# Patient Record
Sex: Male | Born: 1970 | Race: White | Hispanic: No | Marital: Single | Smoking: Former smoker
Health system: Southern US, Community
[De-identification: ages and names within clinical notes are randomized; demographics above are authoritative.]

## PROBLEM LIST (undated history)

## (undated) ENCOUNTER — Inpatient Hospital Stay (HOSPITAL_COMMUNITY): Payer: Self-pay | Admitting: Podiatry

## (undated) DIAGNOSIS — F32A Depression, unspecified: Secondary | ICD-10-CM

## (undated) DIAGNOSIS — Z95810 Presence of automatic (implantable) cardiac defibrillator: Secondary | ICD-10-CM

## (undated) DIAGNOSIS — N189 Chronic kidney disease, unspecified: Secondary | ICD-10-CM

## (undated) DIAGNOSIS — L97509 Non-pressure chronic ulcer of other part of unspecified foot with unspecified severity: Secondary | ICD-10-CM

## (undated) DIAGNOSIS — I509 Heart failure, unspecified: Secondary | ICD-10-CM

## (undated) DIAGNOSIS — I499 Cardiac arrhythmia, unspecified: Secondary | ICD-10-CM

## (undated) DIAGNOSIS — I493 Ventricular premature depolarization: Secondary | ICD-10-CM

## (undated) DIAGNOSIS — E11621 Type 2 diabetes mellitus with foot ulcer: Secondary | ICD-10-CM

## (undated) DIAGNOSIS — E119 Type 2 diabetes mellitus without complications: Secondary | ICD-10-CM

## (undated) DIAGNOSIS — N1831 Chronic kidney disease, stage 3a: Secondary | ICD-10-CM

## (undated) DIAGNOSIS — I219 Acute myocardial infarction, unspecified: Secondary | ICD-10-CM

## (undated) DIAGNOSIS — I2510 Atherosclerotic heart disease of native coronary artery without angina pectoris: Secondary | ICD-10-CM

## (undated) DIAGNOSIS — D649 Anemia, unspecified: Secondary | ICD-10-CM

## (undated) DIAGNOSIS — I429 Cardiomyopathy, unspecified: Secondary | ICD-10-CM

## (undated) DIAGNOSIS — E785 Hyperlipidemia, unspecified: Secondary | ICD-10-CM

## (undated) DIAGNOSIS — I10 Essential (primary) hypertension: Secondary | ICD-10-CM

## (undated) DIAGNOSIS — M726 Necrotizing fasciitis: Secondary | ICD-10-CM

## (undated) DIAGNOSIS — I214 Non-ST elevation (NSTEMI) myocardial infarction: Secondary | ICD-10-CM

## (undated) DIAGNOSIS — R0600 Dyspnea, unspecified: Secondary | ICD-10-CM

## (undated) DIAGNOSIS — I5022 Chronic systolic (congestive) heart failure: Secondary | ICD-10-CM

## (undated) DIAGNOSIS — I472 Ventricular tachycardia, unspecified: Secondary | ICD-10-CM

## (undated) HISTORY — DX: Type 2 diabetes mellitus with foot ulcer: L97.509

## (undated) HISTORY — PX: APPENDECTOMY: SHX54

## (undated) HISTORY — PX: CARDIAC CATHETERIZATION: SHX172

## (undated) HISTORY — DX: Atherosclerotic heart disease of native coronary artery without angina pectoris: I25.10

## (undated) HISTORY — DX: Type 2 diabetes mellitus with foot ulcer: E11.621

## (undated) HISTORY — DX: Heart failure, unspecified: I50.9

## (undated) HISTORY — DX: Cardiomyopathy, unspecified: I42.9

## (undated) HISTORY — DX: Hyperlipidemia, unspecified: E78.5

## (undated) HISTORY — PX: COLON SURGERY: SHX602

## (undated) HISTORY — DX: Type 2 diabetes mellitus without complications: E11.9

---

## 2018-07-27 HISTORY — PX: TOE AMPUTATION: SHX809

## 2019-06-05 DIAGNOSIS — L089 Local infection of the skin and subcutaneous tissue, unspecified: Secondary | ICD-10-CM

## 2019-06-05 DIAGNOSIS — L03119 Cellulitis of unspecified part of limb: Secondary | ICD-10-CM

## 2019-06-05 DIAGNOSIS — E11628 Type 2 diabetes mellitus with other skin complications: Secondary | ICD-10-CM

## 2019-06-05 DIAGNOSIS — L02619 Cutaneous abscess of unspecified foot: Secondary | ICD-10-CM

## 2019-06-12 ENCOUNTER — Ambulatory Visit (INDEPENDENT_AMBULATORY_CARE_PROVIDER_SITE_OTHER): Payer: Self-pay | Admitting: Podiatry

## 2019-06-12 ENCOUNTER — Other Ambulatory Visit: Payer: Self-pay

## 2019-06-12 DIAGNOSIS — Z9889 Other specified postprocedural states: Secondary | ICD-10-CM

## 2019-06-12 DIAGNOSIS — Z481 Encounter for planned postprocedural wound closure: Secondary | ICD-10-CM

## 2019-06-12 NOTE — Progress Notes (Signed)
  Subjective:  Patient ID: Adam James, male    DOB: 01/11/1971,  MRN: [id number]  Chief Complaint  Patient presents with  . Routine Post Op    POV#1 Pt. states," no pian. just neurpathy pain but that's it." Tx: abx -pt denies N/V/F/Ch   . Diabetes    FBS: 170 A1C: 11     DOS: 06/05/2019 Procedure: Partial 5th Ray resection right foot  48 y.o. male returns for post-op check. Doing well denies post-op issues or concerns. Taking doxycycline.  Review of Systems: Negative except as noted in the HPI. Denies N/V/F/Ch.  No past medical history on file.  Current Outpatient Medications:  .  atorvastatin (LIPITOR) 40 MG tablet, Take by mouth., Disp: , Rfl:  .  diltiazem (CARDIZEM CD) 120 MG 24 hr capsule, Take by mouth., Disp: , Rfl:  .  insulin glargine (LANTUS) 100 UNIT/ML injection, Inject into the skin., Disp: , Rfl:  .  insulin lispro (HUMALOG) 100 UNIT/ML injection, Inject into the skin., Disp: , Rfl:  .  Insulin Syringe-Needle U-100 (BD VEO INSULIN SYR U/F 1/2UNIT) 31G X 15/64" 0.3 ML MISC, Inject into the skin., Disp: , Rfl:  .  pantoprazole (PROTONIX) 40 MG tablet, Take by mouth., Disp: , Rfl:  .  aspirin EC 81 MG tablet, Take by mouth., Disp: , Rfl:   Social History   Tobacco Use  Smoking Status Not on file    Allergies  Allergen Reactions  . Morphine Itching   Objective:  There were no vitals filed for this visit. There is no height or weight on file to calculate BMI. Constitutional Well developed. Well nourished.  Vascular Foot warm and well perfused. Capillary refill normal to all digits.   Neurologic Normal speech. Oriented to person, place, and time. Epicritic sensation to light touch grossly present bilaterally.  Dermatologic Skin healing, edges coapted with retention suture. Some necrotic areas of the wound. No warmth erythema signs of acute infection.  Orthopedic: No tenderness to palpation noted about the surgical site.   Radiographs: None Assessment:    1. Post-operative state   2. Encounter for planned post-operative wound closure    Plan:  Patient was evaluated and treated and all questions answered.  S/p foot surgery right -Progressing as expected post-operatively. -XR: none -WB Status: WBAT in surgical shoe -Sutures: intact -Medications: Continue doxycycline -Foot redressed. Betadine WTD -Plan for RTOR for wound debridement and delayed closure. Consent forms reviewed and signed.  No follow-ups on file.

## 2019-06-12 NOTE — Patient Instructions (Signed)
Pre-Operative Instructions  Congratulations, you have decided to take an important step towards improving your quality of life.  You can be assured that the doctors and staff at [facility] will be with you every step of the way.  Here are some important things you should know:  1. Plan to be at the surgery center/hospital at least 1 (one) hour prior to your scheduled time, unless otherwise directed by the surgical center/hospital staff.  You must have a responsible adult accompany you, remain during the surgery and drive you home.  Make sure you have directions to the surgical center/hospital to ensure you arrive on time. 2. If you are having surgery at [facility] or [facility] hospitals, you will need a copy of your medical history and physical form from your family physician within one month prior to the date of surgery. We will give you a form for your primary physician to complete.  3. We make every effort to accommodate the date you request for surgery.  However, there are times where surgery dates or times have to be moved.  We will contact you as soon as possible if a change in schedule is required.   4. No aspirin/ibuprofen for one week before surgery.  If you are on aspirin, any non-steroidal anti-inflammatory medications (Mobic, Aleve, Ibuprofen) should not be taken seven (7) days prior to your surgery.  You make take Tylenol for pain prior to surgery.  5. Medications - If you are taking daily heart and blood pressure medications, seizure, reflux, allergy, asthma, anxiety, pain or diabetes medications, make sure you notify the surgery center/hospital before the day of surgery so they can tell you which medications you should take or avoid the day of surgery. 6. No food or drink after midnight the night before surgery unless directed otherwise by surgical center/hospital staff. 7. No alcoholic beverages 24-hours prior to surgery.  No smoking 24-hours prior or 24-hours after  surgery. 8. Wear loose pants or shorts. They should be loose enough to fit over bandages, boots, and casts. 9. Don't wear slip-on shoes. Sneakers are preferred. 10. Bring your boot with you to the surgery center/hospital.  Also bring crutches or a walker if your physician has prescribed it for you.  If you do not have this equipment, it will be provided for you after surgery. 11. If you have not been contacted by the surgery center/hospital by the day before your surgery, call to confirm the date and time of your surgery. 12. Leave-time from work may vary depending on the type of surgery you have.  Appropriate arrangements should be made prior to surgery with your employer. 13. Prescriptions will be provided immediately following surgery by your doctor.  Fill these as soon as possible after surgery and take the medication as directed. Pain medications will not be refilled on weekends and must be approved by the doctor. 14. Remove nail polish on the operative foot and avoid getting pedicures prior to surgery. 15. Wash the night before surgery.  The night before surgery wash the foot and leg well with water and the antibacterial soap provided. Be sure to pay special attention to beneath the toenails and in between the toes.  Wash for at least three (3) minutes. Rinse thoroughly with water and dry well with a towel.  Perform this wash unless told not to do so by your physician.  Enclosed: 1 Ice pack (please put in freezer the night before surgery)   1 Hibiclens skin cleaner     Pre-op instructions  If you have any questions regarding the instructions, please do not hesitate to call our office.  [place]: [address], [place], [place] [address] -- [phone]  [place]: [address] , [place] [address] -- [phone]  [place]: [address]  [place], [place] [address] -- [phone]   Website: [email or web address] 

## 2019-06-13 ENCOUNTER — Encounter: Payer: Self-pay | Admitting: Podiatry

## 2019-06-13 DIAGNOSIS — Z481 Encounter for planned postprocedural wound closure: Secondary | ICD-10-CM

## 2019-06-15 NOTE — Progress Notes (Signed)
Patient was hospitalized at [facility], was seen as a consult, and underwent surgery.

## 2019-06-21 ENCOUNTER — Other Ambulatory Visit: Payer: Self-pay

## 2019-06-21 ENCOUNTER — Ambulatory Visit (INDEPENDENT_AMBULATORY_CARE_PROVIDER_SITE_OTHER): Payer: Self-pay | Admitting: Sports Medicine

## 2019-06-21 ENCOUNTER — Ambulatory Visit (INDEPENDENT_AMBULATORY_CARE_PROVIDER_SITE_OTHER): Payer: Self-pay

## 2019-06-21 ENCOUNTER — Other Ambulatory Visit: Payer: Self-pay | Admitting: Sports Medicine

## 2019-06-21 ENCOUNTER — Encounter: Payer: Self-pay | Admitting: Sports Medicine

## 2019-06-21 DIAGNOSIS — Z89422 Acquired absence of other left toe(s): Secondary | ICD-10-CM

## 2019-06-21 DIAGNOSIS — Z9889 Other specified postprocedural states: Secondary | ICD-10-CM

## 2019-06-21 DIAGNOSIS — L089 Local infection of the skin and subcutaneous tissue, unspecified: Secondary | ICD-10-CM

## 2019-06-21 DIAGNOSIS — E11628 Type 2 diabetes mellitus with other skin complications: Secondary | ICD-10-CM

## 2019-06-21 NOTE — Progress Notes (Signed)
Subjective: Adam James is a 48 y.o. diabetic male patient seen today in office for POV #1 (DOS 06/13/2019), S/P wound debridement with closure performed by Dr. March Rummage. Patient denies pain at surgical site, denies calf pain, denies headache, chest pain, shortness of breath, nausea, vomiting, fever, or chills. No other issues noted.   Blood sugar this morning was 200  There are no active problems to display for this patient.   Current Outpatient Medications on File Prior to Visit  Medication Sig Dispense Refill  . aspirin EC 81 MG tablet Take by mouth.    Marland Kitchen atorvastatin (LIPITOR) 40 MG tablet Take by mouth.    . diltiazem (CARDIZEM CD) 120 MG 24 hr capsule Take by mouth.    . insulin glargine (LANTUS) 100 UNIT/ML injection Inject into the skin.    Marland Kitchen insulin lispro (HUMALOG) 100 UNIT/ML injection Inject into the skin.    . Insulin Syringe-Needle U-100 (BD VEO INSULIN SYR U/F 1/2UNIT) 31G X 15/64" 0.3 ML MISC Inject into the skin.    . pantoprazole (PROTONIX) 40 MG tablet Take by mouth.     No current facility-administered medications on file prior to visit.     Allergies  Allergen Reactions  . Morphine Itching    Objective: There were no vitals filed for this visit.  General: No acute distress, AAOx3  Right foot: Staples and sutures intact with no gapping or dehiscence at surgical site, there is a small central ulceration noted at the surgical site with fibrogranular tissue no surrounding cellulitis mild edema very minimal active drainage.  Capillary fill time intact 1 through 4 on right.  No pain or crepitation with range of motion right foot.  Protective sensation absent on right.  No pain with calf compression.   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 March Rummage at next office visit. In the meantime, patient to call office if any issues or problems arise.   Landis Martins, DPM

## 2019-06-26 ENCOUNTER — Other Ambulatory Visit: Payer: Self-pay | Admitting: Sports Medicine

## 2019-06-26 ENCOUNTER — Other Ambulatory Visit: Payer: Self-pay

## 2019-06-26 DIAGNOSIS — Z9889 Other specified postprocedural states: Secondary | ICD-10-CM

## 2019-06-26 DIAGNOSIS — Z89422 Acquired absence of other left toe(s): Secondary | ICD-10-CM

## 2019-06-27 ENCOUNTER — Ambulatory Visit (INDEPENDENT_AMBULATORY_CARE_PROVIDER_SITE_OTHER): Payer: Self-pay | Admitting: Podiatry

## 2019-06-27 ENCOUNTER — Other Ambulatory Visit: Payer: Self-pay

## 2019-06-27 DIAGNOSIS — Z9889 Other specified postprocedural states: Secondary | ICD-10-CM

## 2019-06-27 NOTE — Progress Notes (Signed)
  Subjective:  Patient ID: Adam James, male    DOB: 02-07-1971,  MRN: [id number]  Chief Complaint  Patient presents with  . Wound Check    POV #2 Pt. states," doing fine no pain/itching.' -pt dneies any issues/N/V/?Heil Tx: sx shoe and elevation     48 y.o. male returns for post-op check. Doing well denies pain or concerns.  Review of Systems: Negative except as noted in the HPI. Denies N/V/F/Ch.  No past medical history on file.  Current Outpatient Medications:  .  aspirin EC 81 MG tablet, Take by mouth., Disp: , Rfl:  .  atorvastatin (LIPITOR) 40 MG tablet, Take by mouth., Disp: , Rfl:  .  diltiazem (CARDIZEM CD) 120 MG 24 hr capsule, Take by mouth., Disp: , Rfl:  .  insulin glargine (LANTUS) 100 UNIT/ML injection, Inject into the skin., Disp: , Rfl:  .  insulin lispro (HUMALOG) 100 UNIT/ML injection, Inject into the skin., Disp: , Rfl:  .  Insulin Syringe-Needle U-100 (BD VEO INSULIN SYR U/F 1/2UNIT) 31G X 15/64" 0.3 ML MISC, Inject into the skin., Disp: , Rfl:  .  pantoprazole (PROTONIX) 40 MG tablet, Take by mouth., Disp: , Rfl:  .  traMADol (ULTRAM) 50 MG tablet, Take 50 mg by mouth every 6 (six) hours as needed., Disp: , Rfl:   Social History   Tobacco Use  Smoking Status Not on file    Allergies  Allergen Reactions  . Morphine Itching   Objective:  There were no vitals filed for this visit. There is no height or weight on file to calculate BMI. Constitutional Well developed. Well nourished.  Vascular Foot warm and well perfused. Capillary refill normal to all digits.   Neurologic Normal speech. Oriented to person, place, and time. Epicritic sensation to light touch grossly present bilaterally.  Dermatologic Skin healing well without signs of infection. Skin edges well coapted without signs of infection except for triangular area centrally - but area is healing well without warmth erythema.  Orthopedic: No tenderness to palpation noted about the surgical site.    Radiographs: none Assessment:  No diagnosis found. Plan:  Patient was evaluated and treated and all questions answered.  S/p foot surgery right -Progressing as expected post-operatively. -XR: none -WB Status: WBAT in surgical shoe -Sutures: intact. -Medications: nonerefilled -Foot redressed. Dressed with silvadene and DSD  No follow-ups on file.

## 2019-07-04 ENCOUNTER — Ambulatory Visit (INDEPENDENT_AMBULATORY_CARE_PROVIDER_SITE_OTHER): Payer: Self-pay | Admitting: Podiatry

## 2019-07-04 ENCOUNTER — Other Ambulatory Visit: Payer: Self-pay

## 2019-07-04 DIAGNOSIS — Z9889 Other specified postprocedural states: Secondary | ICD-10-CM

## 2019-07-04 MED ORDER — SILVER SULFADIAZINE 1 % EX CREA: TOPICAL_CREAM | CUTANEOUS | 0 refills | Status: DC

## 2019-07-04 NOTE — Progress Notes (Signed)
  Subjective:  Patient ID: Adam James, male    DOB: March 19, 1971,  MRN: [id number]  Chief Complaint  Patient presents with  . Routine Post Op    POV - Pt states,": everything seem sokay." -pt states only with neuropathy pain but otherwise no other pain symptoms -pt denies N/V/F?CH -w/ dizziness   . Diabetes    FBS: 19    48 y.o. male returns for post-op check. Doing well denies pain or concerns. Not wearing surgical shoe.  Review of Systems: Negative except as noted in the HPI. Denies N/V/F/Ch.  No past medical history on file.  Current Outpatient Medications:  .  aspirin EC 81 MG tablet, Take by mouth., Disp: , Rfl:  .  atorvastatin (LIPITOR) 40 MG tablet, Take by mouth., Disp: , Rfl:  .  diltiazem (CARDIZEM CD) 120 MG 24 hr capsule, Take by mouth., Disp: , Rfl:  .  insulin glargine (LANTUS) 100 UNIT/ML injection, Inject into the skin., Disp: , Rfl:  .  insulin lispro (HUMALOG) 100 UNIT/ML injection, Inject into the skin., Disp: , Rfl:  .  Insulin Syringe-Needle U-100 (BD VEO INSULIN SYR U/F 1/2UNIT) 31G X 15/64" 0.3 ML MISC, Inject into the skin., Disp: , Rfl:  .  pantoprazole (PROTONIX) 40 MG tablet, Take by mouth., Disp: , Rfl:  .  silver sulfADIAZINE (SILVADENE) 1 % cream, Apply pea-sized amount to wound daily., Disp: 50 g, Rfl: 0 .  traMADol (ULTRAM) 50 MG tablet, Take 50 mg by mouth every 6 (six) hours as needed., Disp: , Rfl:   Social History   Tobacco Use  Smoking Status Not on file    Allergies  Allergen Reactions  . Morphine Itching   Objective:  There were no vitals filed for this visit. There is no height or weight on file to calculate BMI. Constitutional Well developed. Well nourished.  Vascular Foot warm and well perfused. Capillary refill normal to all digits.   Neurologic Normal speech. Oriented to person, place, and time. Epicritic sensation to light touch grossly present bilaterally.  Dermatologic Skin healing well without signs of infection.  Central area with overlying hyperkeratosis with fibrogranular bed before debridement. No bone exposure.  Orthopedic: No tenderness to palpation noted about the surgical site.   Radiographs: none Assessment:   1. Post-operative state    Plan:  Patient was evaluated and treated and all questions answered.  S/p foot surgery right -Progressing as expected post-operatively. -XR: none -WB Status: WBAT in surgical shoe. -Sutures: removed. Steris left intact. -Medications: rx silvadene. Educated on daily use. -Foot redressed. Dressed with silvadene and DSD -F/u in 1 week for staple removal.   No follow-ups on file.

## 2019-07-10 ENCOUNTER — Ambulatory Visit (INDEPENDENT_AMBULATORY_CARE_PROVIDER_SITE_OTHER): Payer: Self-pay | Admitting: Podiatry

## 2019-07-10 ENCOUNTER — Other Ambulatory Visit: Payer: Self-pay

## 2019-07-10 DIAGNOSIS — Z89422 Acquired absence of other left toe(s): Secondary | ICD-10-CM

## 2019-07-10 DIAGNOSIS — Z9889 Other specified postprocedural states: Secondary | ICD-10-CM

## 2019-07-25 ENCOUNTER — Other Ambulatory Visit: Payer: Self-pay

## 2019-07-25 ENCOUNTER — Ambulatory Visit (INDEPENDENT_AMBULATORY_CARE_PROVIDER_SITE_OTHER): Payer: Self-pay | Admitting: Podiatry

## 2019-07-25 ENCOUNTER — Encounter: Payer: Self-pay | Admitting: Podiatry

## 2019-07-25 DIAGNOSIS — Z9889 Other specified postprocedural states: Secondary | ICD-10-CM

## 2019-07-25 DIAGNOSIS — Z89422 Acquired absence of other left toe(s): Secondary | ICD-10-CM

## 2019-07-25 NOTE — Progress Notes (Signed)
  Subjective:  Patient ID: Adam James, male    DOB: [birth year],  MRN: [id number]  Chief Complaint  Patient presents with  . Routine Post Op    Pt. states," no pain. EVerything seems okay , but nasty." tx: dressing change and silvadene -pt denies N/V?FhC     48 y.o. male returns for post-op check. Doing well denies pain or concerns. Not wearing surgical shoe.  Review of Systems: Negative except as noted in the HPI. Denies N/V/F/Ch.  No past medical history on file.  Current Outpatient Medications:  .  aspirin EC 81 MG tablet, Take by mouth., Disp: , Rfl:  .  atorvastatin (LIPITOR) 40 MG tablet, Take by mouth., Disp: , Rfl:  .  diltiazem (CARDIZEM CD) 120 MG 24 hr capsule, Take by mouth., Disp: , Rfl:  .  insulin glargine (LANTUS) 100 UNIT/ML injection, Inject into the skin., Disp: , Rfl:  .  insulin lispro (HUMALOG) 100 UNIT/ML injection, Inject into the skin., Disp: , Rfl:  .  Insulin Syringe-Needle U-100 (BD VEO INSULIN SYR U/F 1/2UNIT) 31G X 15/64" 0.3 ML MISC, Inject into the skin., Disp: , Rfl:  .  pantoprazole (PROTONIX) 40 MG tablet, Take by mouth., Disp: , Rfl:  .  silver sulfADIAZINE (SILVADENE) 1 % cream, Apply pea-sized amount to wound daily., Disp: 50 g, Rfl: 0 .  traMADol (ULTRAM) 50 MG tablet, Take 50 mg by mouth every 6 (six) hours as needed., Disp: , Rfl:   Social History   Tobacco Use  Smoking Status Not on file    Allergies  Allergen Reactions  . Morphine Itching   Objective:  There were no vitals filed for this visit. There is no height or weight on file to calculate BMI. Constitutional Well developed. Well nourished.  Vascular Foot warm and well perfused. Capillary refill normal to all digits.   Neurologic Normal speech. Oriented to person, place, and time. Epicritic sensation to light touch grossly present bilaterally.  Dermatologic Skin healing with only central area of delayed healing but without signs of infection.  Orthopedic: No tenderness  to palpation noted about the surgical site.   Radiographs: none Assessment:   1. Post-operative state   2. S/P amputation of lesser toe, left ([place])    Plan:  Patient was evaluated and treated and all questions answered.  S/p foot surgery right -Progressing as expected post-operatively. -XR: none -WB Status: WBAT in surgical shoe. -Sutures: all out   -Medications: continue silvadene -Foot redressed. Dressed with silvadene and DSD -F/u in 1 week for staple removal.   No follow-ups on file.

## 2019-07-25 NOTE — Progress Notes (Signed)
  Subjective:  Patient ID: Adam James, male    DOB: 09/07/1970,  MRN: [id number]  Chief Complaint  Patient presents with  . Routine Post Op    PT. states," looks goot to me, big/hard skin cam eoff with staples last time." -pt denies pain/redness/swellign -w/ very litlte draingage Tx: silvadene and dressing change -pt denies N/V/F?Ch   . Diabetes    FBS: 51    48 y.o. male presents for wound care. Hx confirmed with patient.  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 Skin xerotic.  Radiographs:  none Assessment:  1. Post-operative state   2. S/P amputation of lesser toe, left ([place])     Plan:  Patient was evaluated and treated and all questions answered.  Ulcer none -Debrided as below -Dressed with silvadene, DSD -Offloading: normal shoe gear ok at this time -Patient advised to return promptly for wound worsening or should signs of infection develop.  Return in about 4 weeks (around 08/22/2019) for Wound Care, Right.

## 2019-08-22 ENCOUNTER — Ambulatory Visit (INDEPENDENT_AMBULATORY_CARE_PROVIDER_SITE_OTHER): Payer: Self-pay | Admitting: Podiatry

## 2019-08-22 DIAGNOSIS — Z5329 Procedure and treatment not carried out because of patient's decision for other reasons: Secondary | ICD-10-CM

## 2019-08-22 NOTE — Progress Notes (Signed)
No show for appt. 

## 2020-08-31 DIAGNOSIS — I340 Nonrheumatic mitral (valve) insufficiency: Secondary | ICD-10-CM

## 2020-09-01 DIAGNOSIS — I429 Cardiomyopathy, unspecified: Secondary | ICD-10-CM

## 2020-09-01 DIAGNOSIS — R778 Other specified abnormalities of plasma proteins: Secondary | ICD-10-CM

## 2020-09-01 DIAGNOSIS — I119 Hypertensive heart disease without heart failure: Secondary | ICD-10-CM

## 2020-09-01 DIAGNOSIS — E1169 Type 2 diabetes mellitus with other specified complication: Secondary | ICD-10-CM

## 2020-09-01 DIAGNOSIS — I2510 Atherosclerotic heart disease of native coronary artery without angina pectoris: Secondary | ICD-10-CM

## 2020-09-01 DIAGNOSIS — Z794 Long term (current) use of insulin: Secondary | ICD-10-CM

## 2020-09-01 DIAGNOSIS — I5022 Chronic systolic (congestive) heart failure: Secondary | ICD-10-CM

## 2020-09-08 NOTE — Progress Notes (Unsigned)
Cardiology Office Note:    Date:  09/09/2020   ID:  Adam James, DOB 05-Jun-1971, MRN [id number]  PCP:  Patient, No Pcp Per  Cardiologist:  Norman Herrlich, MD    Referring MD: No ref. provider found    ASSESSMENT:    1. Coronary artery disease of native artery of native heart with stable angina pectoris (HCC)   2. Ischemic cardiomyopathy   3. Dyslipidemia   4. Type 2 diabetes mellitus with complication, without long-term current use of insulin (HCC)    PLAN:    In order of problems listed above:  1. Known CAD is developed a severe cardiomyopathy and during recent admission with heart failure and foot ulcer and elevated troponin static pattern.  He will undergo coronary angiography for risk stratification and may require further revascularization.  Has been compliant with follow-up. 2. Severe continue current therapy when I see him in follow-up on transition to [facility].  I am going to increase his diuretic 3. Continue his statin 4. Continue current treatment   Next appointment: 1 month   Medication Adjustments/Labs and Tests Ordered: Current medicines are reviewed at length with the patient today.  Concerns regarding medicines are outlined above.  Orders Placed This Encounter  Procedures  . Basic metabolic panel  . CBC  . EKG 12-Lead   Meds ordered this encounter  Medications  . aspirin EC 81 MG tablet    Sig: Take 1 tablet (81 mg total) by mouth daily. Swallow whole.    Dispense:  90 tablet    Refill:  3  . furosemide (LASIX) 40 MG tablet    Sig: Take one tablet by mouth daily alternating with one tablet by mouth twice daily every other day.    Dispense:  90 tablet    Refill:  3    Chief Complaint  Patient presents with  . Follow-up    With recent [facility] admission with severe cardiomyopathy history of CAD with remote PCI edema poorly controlled diabetes with a foot ulcer    History of Present Illness:    Adam James is a 50 y.o. male with a  hx of CAD severe LV dysfunction and heart failure last seen 09/02/2020 while at [facility] in consultation.  He was admitted to the hospital with peripheral edema and diabetic foot ulcer with uncontrolled type 2 diabetes.  His evaluation showed an elevated proBNP level of 836 troponin was elevated and a non-ACS pattern felt to be due to heart failure creatinine 1.10 GFR greater than 60 cc hemoglobin 11.5 platelets 197,000.  Usual blood sugar was greater than 300.  Echocardiogram showed normal left ventricular size ejection fraction in the range of 30% and mild mitral and aortic regurgitation.  His EKG showed sinus tachycardia 107 bpm otherwise normal..  The background history remote PCI 2005 and stent [place] with known CAD and was advised to have further outpatient evaluation with his cardiomyopathy heart failure and elevated troponin.  I requested records from [place] state that had not arrived when I saw him at [facility].  Compliance with diet, lifestyle and medications: Yes  I received his records from [place] he underwent PCI and stent to the LAD 06/08/2006.  That time he had mild nonobstructive CAD in a ramus branch right coronary artery left circumflex.  EF was 52%.  Since discharge home he is improved but still short of breath walking outdoors and has a little bit of peripheral edema.  He is unable to  control the salt in his diet not weighing daily.  No chest pain palpitation or syncope.  He is taking high-dose aspirin I asked him to reduce to 81 mg daily.  Ready go ahead and schedule outpatient heart catheterization with his marked decrease in ejection fraction.  I do not think a myocardial perfusion study give [place] the answers were looking for and with previous stent cardiac CTA is problematic.  He has no dye allergy in the hospital abnormal renal function.  He is compliant with follow-up.  The wound on his right foot is stable and he is treating it with topical  preparation  Past Medical History:  Diagnosis Date  . CAD (coronary artery disease)   . Cardiomyopathy (HCC)   . Diabetic foot ulcer (HCC)   . Dyslipidemia   . Heart failure (HCC)   . Type 2 diabetes mellitus (HCC)     Past Surgical History:  Procedure Laterality Date  . CARDIAC CATHETERIZATION    . TOE AMPUTATION  2020    Current Medications: Current Meds  Medication Sig  . aspirin EC 81 MG tablet Take 1 tablet (81 mg total) by mouth daily. Swallow whole.  Marland Kitchen atorvastatin (LIPITOR) 40 MG tablet Take by mouth.  . furosemide (LASIX) 40 MG tablet Take one tablet by mouth daily alternating with one tablet by mouth twice daily every other day.  . glyBURIDE-metformin (GLUCOVANCE) 5-500 MG tablet Take 1 tablet by mouth 2 (two) times daily with a meal.  . insulin glargine (LANTUS) 100 UNIT/ML injection Inject into the skin.  Marland Kitchen irbesartan (AVAPRO) 75 MG tablet Take 75 mg by mouth daily.  . metoprolol succinate (TOPROL-XL) 25 MG 24 hr tablet Take 25 mg by mouth daily.  . [DISCONTINUED] aspirin EC 325 MG tablet Take 325 mg by mouth.  . [DISCONTINUED] furosemide (LASIX) 40 MG tablet Take 40 mg by mouth daily.     Allergies:   Morphine   Social History   Socioeconomic History  . Marital status: Single    Spouse name: Not on file  . Number of children: Not on file  . Years of education: Not on file  . Highest education level: Not on file  Occupational History  . Not on file  Tobacco Use  . Smoking status: Current Every Day Smoker    Types: Cigarettes  . Smokeless tobacco: Never Used  . Tobacco comment: 5-6 daily  Vaping Use  . Vaping Use: Never used  Substance and Sexual Activity  . Alcohol use: Not Currently    Comment: Very rare  . Drug use: Not Currently    Types: Marijuana  . Sexual activity: Not on file  Other Topics Concern  . Not on file  Social History Narrative  . Not on file   Social Determinants of Health   Financial Resource Strain: Not on file  Food  Insecurity: Not on file  Transportation Needs: Not on file  Physical Activity: Not on file  Stress: Not on file  Social Connections: Not on file     Family History: The patient's family history includes Congestive Heart Failure in his brother; Heart failure in his father; Hypertension in his brother and mother. ROS:   Please see the history of present illness.    All other systems reviewed and are negative.  EKGs/Labs/Other Studies Reviewed:    The following studies were reviewed today:  EKG:  EKG ordered today and personally reviewed.  The ekg ordered today demonstrates sinus rhythm lateral T wave inversion  Recent Labs: No results found for requested labs within last 8760 hours.  Recent Lipid Panel No results found for: CHOL, TRIG, HDL, CHOLHDL, VLDL, LDLCALC, LDLDIRECT  Physical Exam:    VS:  BP 94/64   Pulse 93   Ht 6\' 3"  (1.905 m)   Wt 232 lb (105.2 kg)   SpO2 94%   BMI 29.00 kg/m     Wt Readings from Last 3 Encounters:  09/09/20 232 lb (105.2 kg)     GEN:  Well nourished, well developed in no acute distress HEENT: Normal NECK: No JVD; No carotid bruits LYMPHATICS: No lymphadenopathy CARDIAC: RRR, no murmurs, rubs, gallops RESPIRATORY:  Clear to auscultation without rales, wheezing or rhonchi  ABDOMEN: Soft, non-tender, non-distended MUSCULOSKELETAL: He has 1-2+ predominantly at the ankle edema.  Edema; No deformity  SKIN: Warm and dry NEUROLOGIC:  Alert and oriented x 3 PSYCHIATRIC:  Normal affect    Signed, 09/11/20, MD  09/09/2020 1:29 PM    [facility]

## 2020-09-09 ENCOUNTER — Other Ambulatory Visit: Payer: Self-pay

## 2020-09-09 ENCOUNTER — Ambulatory Visit (INDEPENDENT_AMBULATORY_CARE_PROVIDER_SITE_OTHER): Payer: Self-pay | Admitting: Cardiology

## 2020-09-09 ENCOUNTER — Encounter: Payer: Self-pay | Admitting: Cardiology

## 2020-09-09 VITALS — BP 94/64 | HR 93 | Ht 75.0 in | Wt 232.0 lb

## 2020-09-09 DIAGNOSIS — I255 Ischemic cardiomyopathy: Secondary | ICD-10-CM

## 2020-09-09 DIAGNOSIS — I25118 Atherosclerotic heart disease of native coronary artery with other forms of angina pectoris: Secondary | ICD-10-CM

## 2020-09-09 DIAGNOSIS — E118 Type 2 diabetes mellitus with unspecified complications: Secondary | ICD-10-CM

## 2020-09-09 DIAGNOSIS — E785 Hyperlipidemia, unspecified: Secondary | ICD-10-CM

## 2020-09-09 MED ORDER — ASPIRIN EC 81 MG PO TBEC: 81.0000 mg | DELAYED_RELEASE_TABLET | Freq: Every day | ORAL | 3 refills | Status: DC

## 2020-09-09 MED ORDER — FUROSEMIDE 40 MG PO TABS: ORAL_TABLET | ORAL | 3 refills | Status: DC

## 2020-09-09 NOTE — Patient Instructions (Signed)
Medication Instructions:  Your physician has recommended you make the following change in your medication:  DECREASE: Aspirin 81 mg take one tablet by mouth daily.  INCREASE: Furosemide 40 mg take an extra tablet by mouth every other day. *If you need a refill on your cardiac medications before your next appointment, please call your pharmacy*   Lab Work: Your physician recommends that you return for lab work in: TODAY CBC, BMP If you have labs (blood work) drawn today and your tests are completely normal, you will receive your results only by: Marland Kitchen MyChart Message (if you have MyChart) OR . A paper copy in the mail If you have any lab test that is abnormal or we need to change your treatment, we will call you to review the results.   Testing/Procedures:    [facility] [place] [address] [place] [address] Dept: [phone] Loc: [phone]  Adam James  09/09/2020  You are scheduled for a Cardiac Catheterization on Friday, February 18 with Dr. Tresa Endo  1. Please arrive at the [facility] (Main Entrance A) at [facility]: [address] [place], [place] [address] at 5:30 AM (This time is two hours before your procedure to ensure your preparation). Free valet parking service is available.   Special note: Every effort is made to have your procedure done on time. Please understand that emergencies sometimes delay scheduled procedures.  2. Diet: Do not eat solid foods after midnight.  The patient may have clear liquids until 5am upon the day of the procedure.  3. Labs: YOU HAD YOUR LABS DRAWN TODAY 09/09/2020  4. Medication instructions in preparation for your procedure:   Contrast Allergy: No  Stop taking, Lasix (Furosemide)  Friday, February 18,2022  Take only HALF OF YOUR REQUIRED units of insulin the night before your procedure. Do not take any insulin on the day of the procedure.   Do  not take Diabetes Med Glucovance (Metformin + Glyburide) on the day of the procedure and HOLD 48 HOURS AFTER THE PROCEDURE.  On the morning of your procedure, take your Aspirin and any morning medicines NOT listed above.  You may use sips of water.  5. Plan for one night stay--bring personal belongings. 6. Bring a current list of your medications and current insurance cards. 7. You MUST have a responsible person to drive you home. 8. Someone MUST be with you the first 24 hours after you arrive home or your discharge will be delayed. 9. Please wear clothes that are easy to get on and off and wear slip-on shoes.  Thank you for allowing [place] to care for you!   -- [facility]    Follow-Up: At [facility], you and your health needs are our priority.  As part of our continuing mission to provide you with exceptional heart care, we have created designated Provider Care Teams.  These Care Teams include your primary Cardiologist (physician) and Advanced Practice Providers (APPs -  Physician Assistants and Nurse Practitioners) who all work together to provide you with the care you need, when you need it.  We recommend signing up for the patient portal called "MyChart".  Sign up information is provided on this After Visit Summary.  MyChart is used to connect with patients for Virtual Visits (Telemedicine).  Patients are able to view lab/test results, encounter notes, upcoming appointments, etc.  Non-urgent messages can be sent to your provider as well.  To learn more about what you can do with MyChart, go to [email or web address].    Your next appointment:   1 month(s)  The format for your next appointment:   In Person  Provider:   Norman Herrlich, MD   Other Instructions

## 2020-09-10 ENCOUNTER — Telehealth: Payer: Self-pay

## 2020-09-10 LAB — BASIC METABOLIC PANEL
BUN/Creatinine Ratio: 18 (ref 9–20)
BUN: 21 mg/dL (ref 6–24)
CO2: 23 mmol/L (ref 20–29)
Calcium: 8.7 mg/dL (ref 8.7–10.2)
Chloride: 102 mmol/L (ref 96–106)
Creatinine, Ser: 1.16 mg/dL (ref 0.76–1.27)
GFR calc Af Amer: 86 mL/min/{1.73_m2} (ref >59)
GFR calc non Af Amer: 74 mL/min/{1.73_m2} (ref >59)
Glucose: 205 mg/dL — ABNORMAL HIGH (ref 65–99)
Potassium: 5.2 mmol/L (ref 3.5–5.2)
Sodium: 138 mmol/L (ref 134–144)

## 2020-09-10 LAB — CBC
Hematocrit: 39.3 % (ref 37.5–51.0)
Hemoglobin: 13.3 g/dL (ref 13.0–17.7)
MCH: 29.1 pg (ref 26.6–33.0)
MCHC: 33.8 g/dL (ref 31.5–35.7)
MCV: 86 fL (ref 79–97)
Platelets: 276 10*3/uL (ref 150–450)
RBC: 4.57 x10E6/uL (ref 4.14–5.80)
RDW: 12.6 % (ref 11.6–15.4)
WBC: 10.5 10*3/uL (ref 3.4–10.8)

## 2020-09-10 NOTE — Telephone Encounter (Signed)
-----   Message from Baldo Daub, MD sent at 09/10/2020  8:43 AM EST ----- Normal results

## 2020-09-10 NOTE — Telephone Encounter (Signed)
I called but patient VM not set. Will try again later.

## 2020-09-11 ENCOUNTER — Other Ambulatory Visit (HOSPITAL_COMMUNITY)
Admission: RE | Admit: 2020-09-11 | Discharge: 2020-09-11 | Disposition: A | Discharge status: Home / Self Care | Payer: HRSA Program | Source: Ambulatory Visit | Attending: Cardiovascular Disease | Admitting: Cardiovascular Disease

## 2020-09-11 DIAGNOSIS — Z20822 Contact with and (suspected) exposure to covid-19: Secondary | ICD-10-CM | POA: Insufficient documentation

## 2020-09-11 DIAGNOSIS — Z01812 Encounter for preprocedural laboratory examination: Secondary | ICD-10-CM | POA: Diagnosis present

## 2020-09-11 LAB — SARS CORONAVIRUS 2 (TAT 6-24 HRS): SARS Coronavirus 2: NEGATIVE

## 2020-09-12 ENCOUNTER — Telehealth: Payer: Self-pay | Admitting: *Deleted

## 2020-09-12 NOTE — Telephone Encounter (Signed)
Pt contacted pre-catheterization scheduled at [facility] for: September 13, 2020 7:30 AM Verified arrival time and place: [facility] Main Entrance [facility]) at: 5:30 AM   No solid food after midnight prior to cath, clear liquids until 5 AM day of procedure.  Hold: Insulin-AM of procedure Glucovance-day of procedure and 48 hours post procedure Lasix-AM of procedure  Except hold medications AM meds can be  taken pre-cath with sips of water including: ASA 81 mg   Confirmed patient has responsible adult to drive home post procedure and be with patient first 24 hours after arriving home: yes  You are allowed ONE visitor in the waiting room during the time you are at the hospital for your procedure. Both you and your visitor must wear a mask once you enter the hospital.   Reviewed procedure/mask/visitor instructions with patient.

## 2020-09-13 ENCOUNTER — Other Ambulatory Visit: Payer: Self-pay

## 2020-09-13 ENCOUNTER — Encounter (HOSPITAL_COMMUNITY)
Admission: RE | Disposition: A | Discharge status: Home / Self Care | Payer: Self-pay | Source: Home / Self Care | Attending: Cardiovascular Disease

## 2020-09-13 ENCOUNTER — Ambulatory Visit (HOSPITAL_COMMUNITY)
Admission: RE | Admit: 2020-09-13 | Discharge: 2020-09-13 | Disposition: A | Discharge status: Home / Self Care | Payer: Self-pay | Attending: Cardiovascular Disease | Admitting: Cardiovascular Disease

## 2020-09-13 ENCOUNTER — Ambulatory Visit (HOSPITAL_BASED_OUTPATIENT_CLINIC_OR_DEPARTMENT_OTHER): Payer: Self-pay

## 2020-09-13 ENCOUNTER — Encounter (HOSPITAL_COMMUNITY): Payer: Self-pay | Admitting: Cardiovascular Disease

## 2020-09-13 DIAGNOSIS — Z95810 Presence of automatic (implantable) cardiac defibrillator: Secondary | ICD-10-CM

## 2020-09-13 DIAGNOSIS — E785 Hyperlipidemia, unspecified: Secondary | ICD-10-CM | POA: Insufficient documentation

## 2020-09-13 DIAGNOSIS — F17210 Nicotine dependence, cigarettes, uncomplicated: Secondary | ICD-10-CM | POA: Insufficient documentation

## 2020-09-13 DIAGNOSIS — I2582 Chronic total occlusion of coronary artery: Secondary | ICD-10-CM | POA: Insufficient documentation

## 2020-09-13 DIAGNOSIS — Z955 Presence of coronary angioplasty implant and graft: Secondary | ICD-10-CM | POA: Insufficient documentation

## 2020-09-13 DIAGNOSIS — I255 Ischemic cardiomyopathy: Secondary | ICD-10-CM

## 2020-09-13 DIAGNOSIS — I25119 Atherosclerotic heart disease of native coronary artery with unspecified angina pectoris: Secondary | ICD-10-CM | POA: Insufficient documentation

## 2020-09-13 DIAGNOSIS — I429 Cardiomyopathy, unspecified: Secondary | ICD-10-CM | POA: Insufficient documentation

## 2020-09-13 DIAGNOSIS — E119 Type 2 diabetes mellitus without complications: Secondary | ICD-10-CM | POA: Insufficient documentation

## 2020-09-13 DIAGNOSIS — I10 Essential (primary) hypertension: Secondary | ICD-10-CM | POA: Insufficient documentation

## 2020-09-13 DIAGNOSIS — Z79899 Other long term (current) drug therapy: Secondary | ICD-10-CM | POA: Insufficient documentation

## 2020-09-13 HISTORY — PX: LEFT HEART CATH AND CORONARY ANGIOGRAPHY: CATH118249

## 2020-09-13 LAB — ECHOCARDIOGRAM COMPLETE
Area-P 1/2: 4.68 cm2
Calc EF: 43.5 %
Height: 75 in
S' Lateral: 4.3 cm
Single Plane A2C EF: 33.3 %
Single Plane A4C EF: 50.2 %
Weight: 3680 oz

## 2020-09-13 LAB — GLUCOSE, CAPILLARY: Glucose-Capillary: 283 mg/dL — ABNORMAL HIGH (ref 70–99)

## 2020-09-13 SURGERY — LEFT HEART CATH AND CORONARY ANGIOGRAPHY
Anesthesia: LOCAL

## 2020-09-13 MED ORDER — HEPARIN (PORCINE) IN NACL 1000-0.9 UT/500ML-% IV SOLN
INTRAVENOUS | Status: DC | PRN
  Administered 2020-09-13 (×2): 500 mL

## 2020-09-13 MED ORDER — ASPIRIN 81 MG PO CHEW: 81.0000 mg | CHEWABLE_TABLET | Freq: Every day | ORAL | Status: DC

## 2020-09-13 MED ORDER — VERAPAMIL HCL 2.5 MG/ML IV SOLN
INTRAVENOUS | Status: AC
  Filled 2020-09-13: qty 2

## 2020-09-13 MED ORDER — IOHEXOL 350 MG/ML SOLN
INTRAVENOUS | Status: DC | PRN
  Administered 2020-09-13: 80 mL

## 2020-09-13 MED ORDER — ATORVASTATIN CALCIUM 80 MG PO TABS: 80.0000 mg | ORAL_TABLET | Freq: Every day | ORAL | Status: DC

## 2020-09-13 MED ORDER — LIDOCAINE HCL (PF) 1 % IJ SOLN
INTRAMUSCULAR | Status: DC | PRN
  Administered 2020-09-13: 2 mL

## 2020-09-13 MED ORDER — VERAPAMIL HCL 2.5 MG/ML IV SOLN
INTRAVENOUS | Status: DC | PRN
  Administered 2020-09-13: 10 mL via INTRA_ARTERIAL
  Administered 2020-09-13: 5 mL via INTRA_ARTERIAL

## 2020-09-13 MED ORDER — HYDRALAZINE HCL 20 MG/ML IJ SOLN: 10.0000 mg | INTRAMUSCULAR | Status: DC | PRN

## 2020-09-13 MED ORDER — SODIUM CHLORIDE 0.9% FLUSH: 3.0000 mL | Freq: Two times a day (BID) | INTRAVENOUS | Status: DC

## 2020-09-13 MED ORDER — SODIUM CHLORIDE 0.9 % IV SOLN: INTRAVENOUS | Status: DC

## 2020-09-13 MED ORDER — AMLODIPINE BESYLATE 5 MG PO TABS
5.0000 mg | ORAL_TABLET | Freq: Every day | ORAL | Status: DC
  Administered 2020-09-13: 5 mg via ORAL
  Filled 2020-09-13: qty 1

## 2020-09-13 MED ORDER — LABETALOL HCL 5 MG/ML IV SOLN: 10.0000 mg | INTRAVENOUS | Status: DC | PRN

## 2020-09-13 MED ORDER — SODIUM CHLORIDE 0.9% FLUSH: 3.0000 mL | INTRAVENOUS | Status: DC | PRN

## 2020-09-13 MED ORDER — PERFLUTREN LIPID MICROSPHERE
1.0000 mL | INTRAVENOUS | Status: DC | PRN
  Administered 2020-09-13: 2 mL via INTRAVENOUS

## 2020-09-13 MED ORDER — ATORVASTATIN CALCIUM 80 MG PO TABS: 80.0000 mg | ORAL_TABLET | Freq: Every day | ORAL | 2 refills | Status: DC

## 2020-09-13 MED ORDER — MIDAZOLAM HCL 2 MG/2ML IJ SOLN
INTRAMUSCULAR | Status: AC
  Filled 2020-09-13: qty 2

## 2020-09-13 MED ORDER — AMLODIPINE BESYLATE 5 MG PO TABS: 5.0000 mg | ORAL_TABLET | Freq: Every day | ORAL | 2 refills | Status: DC

## 2020-09-13 MED ORDER — HEPARIN SODIUM (PORCINE) 1000 UNIT/ML IJ SOLN
INTRAMUSCULAR | Status: AC
  Filled 2020-09-13: qty 1

## 2020-09-13 MED ORDER — ASPIRIN 81 MG PO CHEW
81.0000 mg | CHEWABLE_TABLET | ORAL | Status: AC
  Administered 2020-09-13: 81 mg via ORAL
  Filled 2020-09-13: qty 1

## 2020-09-13 MED ORDER — LIDOCAINE HCL (PF) 1 % IJ SOLN
INTRAMUSCULAR | Status: AC
  Filled 2020-09-13: qty 30

## 2020-09-13 MED ORDER — ACETAMINOPHEN 325 MG PO TABS: 650.0000 mg | ORAL_TABLET | ORAL | Status: DC | PRN

## 2020-09-13 MED ORDER — SODIUM CHLORIDE 0.9 % IV SOLN: 250.0000 mL | INTRAVENOUS | Status: DC | PRN

## 2020-09-13 MED ORDER — HEPARIN (PORCINE) IN NACL 1000-0.9 UT/500ML-% IV SOLN
INTRAVENOUS | Status: AC
  Filled 2020-09-13: qty 1000

## 2020-09-13 MED ORDER — DIAZEPAM 5 MG PO TABS: 5.0000 mg | ORAL_TABLET | Freq: Four times a day (QID) | ORAL | Status: DC | PRN

## 2020-09-13 MED ORDER — MIDAZOLAM HCL 2 MG/2ML IJ SOLN
INTRAMUSCULAR | Status: DC | PRN
  Administered 2020-09-13: 2 mg via INTRAVENOUS

## 2020-09-13 MED ORDER — HEPARIN SODIUM (PORCINE) 1000 UNIT/ML IJ SOLN
INTRAMUSCULAR | Status: DC | PRN
  Administered 2020-09-13: 5000 [IU] via INTRAVENOUS

## 2020-09-13 MED ORDER — FENTANYL CITRATE (PF) 100 MCG/2ML IJ SOLN
INTRAMUSCULAR | Status: AC
  Filled 2020-09-13: qty 2

## 2020-09-13 MED ORDER — FENTANYL CITRATE (PF) 100 MCG/2ML IJ SOLN
INTRAMUSCULAR | Status: DC | PRN
  Administered 2020-09-13: 25 ug via INTRAVENOUS

## 2020-09-13 MED ORDER — ONDANSETRON HCL 4 MG/2ML IJ SOLN: 4.0000 mg | Freq: Four times a day (QID) | INTRAMUSCULAR | Status: DC | PRN

## 2020-09-13 SURGICAL SUPPLY — 14 items
BAG SNAP BAND KOVER 36X36 (MISCELLANEOUS) ×2 IMPLANT
CATH 5FR JL3.5 JR4 ANG PIG MP (CATHETERS) ×2 IMPLANT
CATH INFINITI 4FR RCB (CATHETERS) ×2 IMPLANT
CATH INFINITI 5 FR JR5 (CATHETERS) ×2 IMPLANT
CATH OPTITORQUE TIG 4.0 5F (CATHETERS) ×2 IMPLANT
COVER DOME SNAP 22 D (MISCELLANEOUS) ×2 IMPLANT
DEVICE RAD COMP TR BAND LRG (VASCULAR PRODUCTS) ×2 IMPLANT
GLIDESHEATH SLEND SS 6F .021 (SHEATH) ×2 IMPLANT
GUIDEWIRE INQWIRE 1.5J.035X260 (WIRE) ×1 IMPLANT
INQWIRE 1.5J .035X260CM (WIRE) ×2
KIT HEART LEFT (KITS) ×2 IMPLANT
PACK CARDIAC CATHETERIZATION (CUSTOM PROCEDURE TRAY) ×2 IMPLANT
TRANSDUCER W/STOPCOCK (MISCELLANEOUS) ×2 IMPLANT
TUBING CIL FLEX 10 FLL-RA (TUBING) ×2 IMPLANT

## 2020-09-13 NOTE — Discharge Instructions (Signed)
NO GLUCOVANCE /METFORMIN FOR 2 DAYS    Radial Site Care  This sheet gives you information about how to care for yourself after your procedure. Your health care provider may also give you more specific instructions. If you have problems or questions, contact your health care provider. What can I expect after the procedure? After the procedure, it is common to have:  Bruising and tenderness at the catheter insertion area. Follow these instructions at home: Medicines  Take over-the-counter and prescription medicines only as told by your health care provider. Insertion site care  Follow instructions from your health care provider about how to take care of your insertion site. Make sure you: ? Wash your hands with soap and water before you change your bandage (dressing). If soap and water are not available, use hand sanitizer. ? Change your dressing as told by your health care provider. ? Leave stitches (sutures), skin glue, or adhesive strips in place. These skin closures may need to stay in place for 2 weeks or longer. If adhesive strip edges start to loosen and curl up, you may trim the loose edges. Do not remove adhesive strips completely unless your health care provider tells you to do that.  Check your insertion site every day for signs of infection. Check for: ? Redness, swelling, or pain. ? Fluid or blood. ? Pus or a bad smell. ? Warmth.  Do not take baths, swim, or use a hot tub until your health care provider approves.  You may shower 24-48 hours after the procedure, or as directed by your health care provider. ? Remove the dressing and gently wash the site with plain soap and water. ? Pat the area dry with a clean towel. ? Do not rub the site. That could cause bleeding.  Do not apply powder or lotion to the site. Activity  For 24 hours after the procedure, or as directed by your health care provider: ? Do not flex or bend the affected arm. ? Do not push or pull heavy  objects with the affected arm. ? Do not drive yourself home from the hospital or clinic. You may drive 24 hours after the procedure unless your health care provider tells you not to. ? Do not operate machinery or power tools.  Do not lift anything that is heavier than 10 lb (4.5 kg), or the limit that you are told, until your health care provider says that it is safe.  Ask your health care provider when it is okay to: ? Return to work or school. ? Resume usual physical activities or sports. ? Resume sexual activity.   General instructions  If the catheter site starts to bleed, raise your arm and put firm pressure on the site. If the bleeding does not stop, get help right away. This is a medical emergency.  If you went home on the same day as your procedure, a responsible adult should be with you for the first 24 hours after you arrive home.  Keep all follow-up visits as told by your health care provider. This is important. Contact a health care provider if:  You have a fever.  You have redness, swelling, or yellow drainage around your insertion site. Get help right away if:  You have unusual pain at the radial site.  The catheter insertion area swells very fast.  The insertion area is bleeding, and the bleeding does not stop when you hold steady pressure on the area.  Your arm or hand becomes pale, cool,  tingly, or numb. These symptoms may represent a serious problem that is an emergency. Do not wait to see if the symptoms will go away. Get medical help right away. Call your local emergency services (911 in the [place] Do not drive yourself to the hospital. Summary  After the procedure, it is common to have bruising and tenderness at the site.  Follow instructions from your health care provider about how to take care of your radial site wound. Check the wound every day for signs of infection.  Do not lift anything that is heavier than 10 lb (4.5 kg), or the limit that you are  told, until your health care provider says that it is safe. This information is not intended to replace advice given to you by your health care provider. Make sure you discuss any questions you have with your health care provider. Document Revised: 08/18/2017 Document Reviewed: 08/18/2017 Elsevier Patient Education  2021 [employer].

## 2020-09-13 NOTE — Progress Notes (Signed)
On arrival from cath lab client called his daughter to come in to visit and informed client no visitors in this dept and client told his daughter "then I'm not going to have my surgery"

## 2020-09-13 NOTE — Progress Notes (Signed)
[facility] consulted for outpt CABG evaluation. [facility] office will call the patient with an appointment.

## 2020-09-13 NOTE — Progress Notes (Signed)
Echocardiogram 2D Echocardiogram with defintiy has been performed.  Leta Jungling M 09/13/2020, 9:56 AM

## 2020-09-18 ENCOUNTER — Institutional Professional Consult (permissible substitution) (INDEPENDENT_AMBULATORY_CARE_PROVIDER_SITE_OTHER): Payer: Self-pay | Admitting: Thoracic Surgery (Cardiothoracic Vascular Surgery)

## 2020-09-18 ENCOUNTER — Encounter: Payer: Self-pay | Admitting: Thoracic Surgery (Cardiothoracic Vascular Surgery)

## 2020-09-18 ENCOUNTER — Other Ambulatory Visit: Payer: Self-pay

## 2020-09-18 VITALS — BP 94/53 | HR 107 | Resp 20 | Ht 75.0 in | Wt 230.0 lb

## 2020-09-18 DIAGNOSIS — I25119 Atherosclerotic heart disease of native coronary artery with unspecified angina pectoris: Secondary | ICD-10-CM

## 2020-09-18 NOTE — Progress Notes (Signed)
[address]       [place] [address]             [phone]        Jaydyn Bozzo [facility] Medical Record #[id number] Date of Birth: 17-Sep-1970  Referring: Lennette Bihari, MD Primary Care: Patient, No Pcp Per Primary Cardiologist:No primary care provider on file.  Chief Complaint:    Chief Complaint  Patient presents with  . Coronary Artery Disease    Initial surgical consult, cath 2/18    History of Present Illness:     50 year old gentleman that presents for surgical evaluation of severe coronary artery disease.  He originally presented in early February of this year to the emergency department with an ulcer on his foot due to his diabetes.  Labs were concerning for heart failure.  He has been treated for advanced heart failure symptoms, and has noted an improvement in his exercise tolerance as well as his lower extremity swelling.  He currently denies any anginal symptoms except for some mild left arm paresthesias, and only experiences dyspnea with exertion.  He states that he becomes short of breath simply by walking up the steps to his porch.  When he walks his dog he states that he is exhausted afterwards.  He has a history of an MI treated with PCI in 2007, and reports that that was the only time that he experienced any chest tightness.  Outside of that he has only had some exertional dyspnea and lower extremity swelling.  Both of these have been improved since he has been placed on appropriate medication.   Past Medical and Surgical History: Previous Chest Surgery: No Previous Chest Radiation: No Diabetes Mellitus: Yes.  HbA1C unknown Creatinine: 1.16  Past Medical History:  Diagnosis Date  . CAD (coronary artery disease)   . Cardiomyopathy (HCC)   . Diabetic foot ulcer (HCC)   . Dyslipidemia   . Heart failure (HCC)   . Type 2 diabetes mellitus (HCC)     Past Surgical History:  Procedure Laterality Date  . CARDIAC CATHETERIZATION     . LEFT HEART CATH AND CORONARY ANGIOGRAPHY N/A 09/13/2020   Procedure: LEFT HEART CATH AND CORONARY ANGIOGRAPHY;  Surgeon: Lennette Bihari, MD;  [facility];  Service: Cardiovascular;  Laterality: N/A;  . TOE AMPUTATION  2020    Social History: Support: Lives with his daughter.  He is currently out of work and is seeking Medicaid.  Social History   Tobacco Use  Smoking Status Current Every Day Smoker  . Types: Cigarettes  Smokeless Tobacco Never Used  Tobacco Comment   5-6 daily    Social History   Substance and Sexual Activity  Alcohol Use Not Currently   Comment: Very rare     Allergies  Allergen Reactions  . Morphine Itching      Current Outpatient Medications  Medication Sig Dispense Refill  . amLODipine (NORVASC) 5 MG tablet Take 1 tablet (5 mg total) by mouth daily. 60 tablet 2  . aspirin EC 81 MG tablet Take 1 tablet (81 mg total) by mouth daily. Swallow whole. 90 tablet 3  . atorvastatin (LIPITOR) 80 MG tablet Take 1 tablet (80 mg total) by mouth daily. 60 tablet 2  . furosemide (LASIX) 40 MG tablet Take one tablet by mouth daily alternating with one tablet by mouth twice daily every other day. (Patient taking differently: Take 40 mg by mouth See admin instructions. Take 40  mg by mouth daily alternating with 40 mg twice daily every other day.) 90 tablet 3  . glyBURIDE-metformin (GLUCOVANCE) 5-500 MG tablet Take 1 tablet by mouth 2 (two) times daily with a meal.    . insulin glargine (LANTUS) 100 UNIT/ML injection Inject 30 Units into the skin daily.    . irbesartan (AVAPRO) 75 MG tablet Take 75 mg by mouth daily.    . metoprolol succinate (TOPROL-XL) 25 MG 24 hr tablet Take 25 mg by mouth daily.    . silver sulfADIAZINE (SILVADENE) 1 % cream Apply 1 application topically daily.     No current facility-administered medications for this visit.    (Not in a hospital admission)   Family History  Problem Relation Age of Onset  . Hypertension  Mother   . Heart failure Father   . Hypertension Brother   . Congestive Heart Failure Brother      Review of Systems:   Review of Systems  Constitutional: Positive for malaise/fatigue.  Respiratory: Positive for shortness of breath. Negative for cough.   Cardiovascular: Positive for orthopnea and leg swelling. Negative for chest pain.      Physical Exam: BP (!) 94/53 (BP Location: Right Arm, Patient Position: Sitting)   Pulse (!) 107   Resp 20   Ht 6\' 3"  (1.905 m)   Wt 230 lb (104.3 kg)   SpO2 97% Comment: RA  BMI 28.75 kg/m  Physical Exam Constitutional:      General: He is not in acute distress.    Appearance: Normal appearance. He is normal weight. He is not ill-appearing.  Cardiovascular:     Rate and Rhythm: Tachycardia present.  Pulmonary:     Effort: Pulmonary effort is normal. No respiratory distress.  Abdominal:     General: Abdomen is flat. There is no distension.  Musculoskeletal:     Right lower leg: No edema.     Left lower leg: No edema.  Skin:    General: Skin is dry.  Neurological:     General: No focal deficit present.     Mental Status: He is alert and oriented to person, place, and time.       Diagnostic Studies & Laboratory data:    Left Heart Catherization: Left heart cath was reviewed.  His RCA is completely occluded and fills from left to right collaterals.  His LAD is also 100% occluded distal to the stent.  He has a 50% left main lesion as well as a 70% lesions in the ramus and circumflex branches.  There does not appear to be a good LAD target.  Echo: Echocardiogram from September 13, 2020 was reviewed.  His EF is measured at 40 to 45%.  His LV diastolic diameter is 5 and half centimeters.  There is no significant valvular disease.  His anterior and septal wall do not appear completely thinned out.   I have independently reviewed the above radiologic studies and discussed with the patient   Recent Lab Findings: Lab Results  Component  Value Date   WBC 10.5 09/09/2020   HGB 13.3 09/09/2020   HCT 39.3 09/09/2020   PLT 276 09/09/2020   GLUCOSE 205 (H) 09/09/2020   NA 138 09/09/2020   K 5.2 09/09/2020   CL 102 09/09/2020   CREATININE 1.16 09/09/2020   BUN 21 09/09/2020   CO2 23 09/09/2020      Assessment / Plan:   50 year old male who presents with severe three-vessel coronary disease, and a recent history of congestive  heart failure.  In regards to his symptoms she does not complain of much anginal pain thus I am concerned about the viability of his myocardium.  Most of his symptoms are related to heart failure but now that he is on medications these are much improved.  On review of his left heart cath there is not a good LAD target.  There are good targets on the ramus circumflex and PDA.  If he is to undergo surgical bypass and I likely will place the LIMA on the ramus vessel.  I have ordered a cardiac MRI to check for viability of his anterior and septal wall.  I have also made a referral to failure cardiologist given his young age and questionable candidacy for surgical revascularization.  He currently is unfunded and I have also made a referral to for him to meet with our social workers to assist with this.     I  spent 40 minutes counseling the patient face to face.   Corliss Skains 09/18/2020 3:48 PM

## 2020-09-19 ENCOUNTER — Telehealth: Payer: Self-pay | Admitting: Licensed Clinical Social Worker

## 2020-09-19 ENCOUNTER — Telehealth: Payer: Self-pay | Admitting: Thoracic Surgery (Cardiothoracic Vascular Surgery)

## 2020-09-19 NOTE — Telephone Encounter (Signed)
Spoke with patient regarding preferred weekdays ant times for scheduling the Cardiac MRI  Ordered by Dr. Cliffton Asters.  Informed patient I will be in touch with his appointment information.  He voiced his understanding.

## 2020-09-19 NOTE — Progress Notes (Signed)
Heart and Vascular Care Navigation  09/19/2020  Adam James 1970/10/28 [id number]  Reason for Referral: CSW received referral to assist patient with insurance for needed procedure.                                                                                                    Assessment: Patient is a 50yo male who reports he resides with his daughter in a single family home. Patient reports he was taking care of his father who passed away last year. He has not been working due to his care giving for his father.  He currently has no income and no insurance. Patient is in need of "bypass or a transplant" and need to apply for disability.                                     HRT/VAS Care Coordination    Patients Home Cardiology Office TCTS   Outpatient Care Team Social Worker   Social Worker Name: Lasandra Beech, [place] [phone]   Living arrangements for the past 2 months Single Family Home   Lives with: Adult Children   Patient Current Insurance Coverage Self-Pay   Patient Has Concern With Paying Medical Bills Yes   Does Patient Have Prescription Coverage? No      Social History:                                                                             SDOH Screenings   Alcohol Screen: Not on file  Depression (OJJ0-0): Not on file  Financial Resource Strain: High Risk   Difficulty of Paying Living Expenses: Very hard  Food Insecurity: Food Insecurity Present   Worried About Running Out of Food in the Last Year: Sometimes true   Ran Out of Food in the Last Year: Sometimes true  Housing: Low Risk    Last Housing Risk Score: 0  Physical Activity: Not on file  Social Connections: Not on file  Stress: Not on file  Tobacco Use: High Risk   Smoking Tobacco Use: Current Every Day Smoker   Smokeless Tobacco Use: Never Used  Transportation Needs: No Transportation Needs   Lack of Transportation (Medical): No   Lack of Transportation (Non-Medical): No    SDOH  Interventions: Financial Resources:  Financial Strain Interventions: Other (Comment) (Referral to [facility]) referred to servant Center for disability application  Food Insecurity:  Food Insecurity Interventions: Assist with [employer]  Housing Insecurity:  Housing Interventions: Intervention Not Indicated  Transportation:   Transportation Interventions: Intervention Not Indicated    Follow-up plan:  CSW discussed resources and application process for Social security. Patient agreeable to referral to [facility] for disability/medicaid application. Patient denies any other needs  at this time as he has family support and states he is aware of financial resources through the [employer]. CSW will start paperwork and mail to patient for signature and return to CSW. Patient verbalizes understanding and follow up needed to start application process. CSW continues to follow. Lasandra Beech, LCSW, CCSW-MCS [phone]

## 2020-09-20 ENCOUNTER — Encounter: Payer: Self-pay | Admitting: Thoracic Surgery (Cardiothoracic Vascular Surgery)

## 2020-09-20 NOTE — Telephone Encounter (Signed)
Spoke with patient regarding the  Wednesday 10/09/20 8:00 am Cardiac MRI appointment at [facility]--arrival time is 7:30 am 1st floor admissions office for check in.  Will mail information to patient and he voiced his understanding.

## 2020-09-30 DIAGNOSIS — E119 Type 2 diabetes mellitus without complications: Secondary | ICD-10-CM | POA: Insufficient documentation

## 2020-09-30 DIAGNOSIS — I509 Heart failure, unspecified: Secondary | ICD-10-CM | POA: Insufficient documentation

## 2020-09-30 DIAGNOSIS — E11621 Type 2 diabetes mellitus with foot ulcer: Secondary | ICD-10-CM | POA: Insufficient documentation

## 2020-09-30 DIAGNOSIS — I2510 Atherosclerotic heart disease of native coronary artery without angina pectoris: Secondary | ICD-10-CM | POA: Insufficient documentation

## 2020-09-30 DIAGNOSIS — E785 Hyperlipidemia, unspecified: Secondary | ICD-10-CM | POA: Insufficient documentation

## 2020-10-05 NOTE — Progress Notes (Signed)
Cardiology Office Note:    Date:  10/07/2020   ID:  Adam James, DOB 06/27/1971, MRN [id number]  PCP:  Patient, No Pcp Per  Cardiologist:  Norman Herrlich, MD    Referring MD: No ref. provider found    ASSESSMENT:    1. Coronary artery disease involving native coronary artery of native heart with angina pectoris (HCC)   2. Ischemic cardiomyopathy   3. Dyslipidemia   4. Type 2 diabetes mellitus with complication, without long-term current use of insulin (HCC)    PLAN:    In order of problems listed above:  1. Very complicated case with severe cardiomyopathy and multivessel CAD including left main stenosis.  He was declined for elective bypass surgery.  Continue medical treatment I will ask interventional cardiology to again review his case. 2. Continue current treatment he is on diuretic beta-blocker ARB I would transition him to [facility] however he is financial limitations will readdress next visit and continue a calcium channel blocker with the CAD 3. Continue statin check lipid profile today 4. On oral agents he has a diabetic foot ulcer   Next appointment: 3 months   Medication Adjustments/Labs and Tests Ordered: Current medicines are reviewed at length with the patient today.  Concerns regarding medicines are outlined above.  Orders Placed This Encounter  Procedures  . Lipid panel  . Basic metabolic panel  . Pro b natriuretic peptide (BNP)   No orders of the defined types were placed in this encounter.   Chief Complaint  Patient presents with  . Follow-up  . Coronary Artery Disease    Recent coronary arteriography multivessel disease seen by CT surgery and not advised CABG at this time.    History of Present Illness:    Adam James is a 50 y.o. male with a hx of CAD severe LV dysfunction with heart failure dyslipidemia and type 2 diabetes mellitus with foot ulcer last seen 09/09/2020 and referred for coronary angiography.  He is on medical therapy  including loop diuretic beta-blocker and AR B and calcium channel blocker.  Compliance with diet, lifestyle and medications: Yes  He is having difficulty affording medications and is in the process of applying for Medicaid. Not having edema shortness of breath chest pain palpitation or syncope but feels fatigued. His foot ulcer is clean but still open and receiving local care. He is concerned about his prognosis and I will ask interventional cardiology to review his films again.  Coronary angiography showed severe multivessel CAD with 50% distal left main coronary artery stenosis and was referred to cardiothoracic surgery for CABG.  Left heart cath 09/13/2020: Coronary Findings   Diagnostic Dominance: Right  Left Main  Mid LM to Dist LM lesion is 50% stenosed.  Left Anterior Descending  Ost LAD to Mid LAD lesion is 90% stenosed. The lesion was previously treated.  Mid LAD to Dist LAD lesion is 90% stenosed.  Dist LAD lesion is 100% stenosed.  Ramus Intermedius  Ramus lesion is 70% stenosed.  Left Circumflex  Prox Cx lesion is 70% stenosed.  First Obtuse Marginal Branch  1st Mrg lesion is 50% stenosed.  Right Coronary Artery  Prox RCA to Mid RCA lesion is 100% stenosed.  Dist RCA lesion is 100% stenosed.  Acute Marginal Branch  Collaterals  Acute Mrg filled by collaterals from Ost RCA.    Third Right Posterolateral Branch  Collaterals  3rd RPL filled by collaterals from 1st Sept.     Intervention   No interventions have been  documented.  Left Heart  Left Ventricle LVEDP 15 mmHg.   Coronary Diagrams   Diagnostic Dominance: Right      Echocardiogram 09/13/2020 performed at [facility] independently reviewed EF 40 to 45% normal right ventricular function.  Seen by CT surgery 09/18/2020 with concerns of viability of the myocardium in the LAD distribution and limited options for revascularization with chronic total occlusion advised to cardiac MR  10/09/2020 for viability and deferred CABG at this time Past Medical History:  Diagnosis Date  . CAD (coronary artery disease)   . Cardiomyopathy (HCC)   . Diabetic foot ulcer (HCC)   . Dyslipidemia   . Heart failure (HCC)   . Type 2 diabetes mellitus (HCC)     Past Surgical History:  Procedure Laterality Date  . CARDIAC CATHETERIZATION    . LEFT HEART CATH AND CORONARY ANGIOGRAPHY N/A 09/13/2020   Procedure: LEFT HEART CATH AND CORONARY ANGIOGRAPHY;  Surgeon: Lennette Bihari, MD;  Location: [facility];  Service: Cardiovascular;  Laterality: N/A;  . TOE AMPUTATION  2020    Current Medications: Current Meds  Medication Sig  . amLODipine (NORVASC) 5 MG tablet Take 1 tablet (5 mg total) by mouth daily.  Marland Kitchen aspirin EC 81 MG tablet Take 1 tablet (81 mg total) by mouth daily. Swallow whole.  Marland Kitchen atorvastatin (LIPITOR) 80 MG tablet Take 1 tablet (80 mg total) by mouth daily.  . furosemide (LASIX) 40 MG tablet Take 40 mg by mouth daily. Take 40 mg every other day alternating with 2 tablets (80mg ) every other day  . glyBURIDE-metformin (GLUCOVANCE) 5-500 MG tablet Take 1 tablet by mouth 2 (two) times daily with a meal.  . irbesartan (AVAPRO) 75 MG tablet Take 75 mg by mouth daily.  . metoprolol succinate (TOPROL-XL) 25 MG 24 hr tablet Take 25 mg by mouth daily.  . silver sulfADIAZINE (SILVADENE) 1 % cream Apply 1 application topically daily.     Allergies:   Morphine   Social History   Socioeconomic History  . Marital status: Single    Spouse name: Not on file  . Number of children: Not on file  . Years of education: Not on file  . Highest education level: Not on file  Occupational History  . Not on file  Tobacco Use  . Smoking status: Current Every Day Smoker    Types: Cigarettes  . Smokeless tobacco: Never Used  . Tobacco comment: 5-6 daily  Vaping Use  . Vaping Use: Never used  Substance and Sexual Activity  . Alcohol use: Not Currently    Comment: Very rare  . Drug  use: Not Currently    Types: Marijuana  . Sexual activity: Not on file  Other Topics Concern  . Not on file  Social History Narrative  . Not on file   Social Determinants of Health   Financial Resource Strain: High Risk  . Difficulty of Paying Living Expenses: Very hard  Food Insecurity: Food Insecurity Present  . Worried About in the Last Year: Sometimes true  . Ran Out of Food in the Last Year: Sometimes true  Transportation Needs: No Transportation Needs  . Lack of Transportation (Medical): No  . Lack of Transportation (Non-Medical): No  Physical Activity: Not on file  Stress: Not on file  Social Connections: Not on file     Family History: The patient's family history includes Congestive Heart Failure in his brother; Heart failure in his father; Hypertension in his brother  and mother. ROS:   Please see the history of present illness.    All other systems reviewed and are negative.  EKGs/Labs/Other Studies Reviewed:    The following studies were reviewed today:   Recent Labs: 09/09/2020: BUN 21; Creatinine, Ser 1.16; Hemoglobin 13.3; Platelets 276; Potassium 5.2; Sodium 138  Recent Lipid Panel No results found for: CHOL, TRIG, HDL, CHOLHDL, VLDL, LDLCALC, LDLDIRECT  Physical Exam:    VS:  BP 102/80 (BP Location: Left Arm, Patient Position: Sitting, Cuff Size: Normal)   Pulse 92   Ht 6\' 3"  (1.905 m)   Wt 231 lb (104.8 kg)   SpO2 97%   BMI 28.87 kg/m     Wt Readings from Last 3 Encounters:  10/07/20 231 lb (104.8 kg)  09/18/20 230 lb (104.3 kg)  09/13/20 230 lb (104.3 kg)     GEN:  Well nourished, well developed in no acute distress HEENT: Normal NECK: No JVD; No carotid bruits LYMPHATICS: No lymphadenopathy CARDIAC: RRR, no murmurs, rubs, gallops RESPIRATORY:  Clear to auscultation without rales, wheezing or rhonchi  ABDOMEN: Soft, non-tender, non-distended MUSCULOSKELETAL:  No edema; No deformity  SKIN: Warm and dry NEUROLOGIC:   Alert and oriented x 3 PSYCHIATRIC:  Normal affect    Signed, 09/15/20, MD  10/07/2020 10:03 AM    [facility]

## 2020-10-07 ENCOUNTER — Ambulatory Visit (INDEPENDENT_AMBULATORY_CARE_PROVIDER_SITE_OTHER): Payer: Self-pay | Admitting: Cardiology

## 2020-10-07 ENCOUNTER — Other Ambulatory Visit: Payer: Self-pay

## 2020-10-07 ENCOUNTER — Encounter: Payer: Self-pay | Admitting: Cardiology

## 2020-10-07 VITALS — BP 102/80 | HR 92 | Ht 75.0 in | Wt 231.0 lb

## 2020-10-07 DIAGNOSIS — I25119 Atherosclerotic heart disease of native coronary artery with unspecified angina pectoris: Secondary | ICD-10-CM

## 2020-10-07 DIAGNOSIS — E785 Hyperlipidemia, unspecified: Secondary | ICD-10-CM

## 2020-10-07 DIAGNOSIS — I255 Ischemic cardiomyopathy: Secondary | ICD-10-CM

## 2020-10-07 DIAGNOSIS — E118 Type 2 diabetes mellitus with unspecified complications: Secondary | ICD-10-CM

## 2020-10-07 NOTE — Patient Instructions (Signed)
Medication Instructions:  Your physician recommends that you continue on your current medications as directed. Please refer to the Current Medication list given to you today.  *If you need a refill on your cardiac medications before your next appointment, please call your pharmacy*   Lab Work: Your physician recommends that you return for lab work in: TODAY Lipids, BMP, ProBNP If you have labs (blood work) drawn today and your tests are completely normal, you will receive your results only by: Marland Kitchen MyChart Message (if you have MyChart) OR . A paper copy in the mail If you have any lab test that is abnormal or we need to change your treatment, we will call you to review the results.   Testing/Procedures: None   Follow-Up: At [facility], you and your health needs are our priority.  As part of our continuing mission to provide you with exceptional heart care, we have created designated Provider Care Teams.  These Care Teams include your primary Cardiologist (physician) and Advanced Practice Providers (APPs -  Physician Assistants and Nurse Practitioners) who all work together to provide you with the care you need, when you need it.  We recommend signing up for the patient portal called "MyChart".  Sign up information is provided on this After Visit Summary.  MyChart is used to connect with patients for Virtual Visits (Telemedicine).  Patients are able to view lab/test results, encounter notes, upcoming appointments, etc.  Non-urgent messages can be sent to your provider as well.   To learn more about what you can do with MyChart, go to [email or web address].    Your next appointment:   3 month(s)  The format for your next appointment:   In Person  Provider:   Norman Herrlich, MD   Other Instructions

## 2020-10-08 ENCOUNTER — Telehealth (HOSPITAL_COMMUNITY): Payer: Self-pay | Admitting: *Deleted

## 2020-10-08 ENCOUNTER — Telehealth: Payer: Self-pay

## 2020-10-08 LAB — BASIC METABOLIC PANEL
BUN/Creatinine Ratio: 20 (ref 9–20)
BUN: 26 mg/dL — ABNORMAL HIGH (ref 6–24)
CO2: 23 mmol/L (ref 20–29)
Calcium: 9.3 mg/dL (ref 8.7–10.2)
Chloride: 102 mmol/L (ref 96–106)
Creatinine, Ser: 1.31 mg/dL — ABNORMAL HIGH (ref 0.76–1.27)
Glucose: 276 mg/dL — ABNORMAL HIGH (ref 65–99)
Potassium: 4.7 mmol/L (ref 3.5–5.2)
Sodium: 138 mmol/L (ref 134–144)
eGFR: 67 mL/min/{1.73_m2} (ref >59)

## 2020-10-08 LAB — LIPID PANEL
Chol/HDL Ratio: 4.6 ratio (ref 0.0–5.0)
Cholesterol, Total: 167 mg/dL (ref 100–199)
HDL: 36 mg/dL — ABNORMAL LOW (ref >39)
LDL Chol Calc (NIH): 108 mg/dL — ABNORMAL HIGH (ref 0–99)
Triglycerides: 125 mg/dL (ref 0–149)
VLDL Cholesterol Cal: 23 mg/dL (ref 5–40)

## 2020-10-08 LAB — PRO B NATRIURETIC PEPTIDE: NT-Pro BNP: 468 pg/mL — ABNORMAL HIGH (ref 0–121)

## 2020-10-08 NOTE — Telephone Encounter (Signed)
Attempted to call patient regarding upcoming cardiac CT appointment but was unable to reach patient.  Called pt's daughter and left number with daughter for patient to call back if he had questions about his appointment.  Larey Brick RN Navigator Cardiac Imaging [facility] [phone] Office [phone] Cell

## 2020-10-08 NOTE — Telephone Encounter (Signed)
-----   Message from Baldo Daub, MD sent at 10/08/2020  1:55 PM EDT ----- Stable results no changes

## 2020-10-08 NOTE — Telephone Encounter (Signed)
Patient notified of results.

## 2020-10-09 ENCOUNTER — Ambulatory Visit (HOSPITAL_COMMUNITY): Admission: RE | Admit: 2020-10-09 | Payer: Self-pay | Source: Ambulatory Visit

## 2020-10-10 ENCOUNTER — Encounter: Payer: Self-pay | Admitting: Thoracic Surgery (Cardiothoracic Vascular Surgery)

## 2020-10-10 ENCOUNTER — Telehealth: Payer: Self-pay | Admitting: *Deleted

## 2020-10-10 ENCOUNTER — Telehealth: Payer: Self-pay | Admitting: Licensed Clinical Social Worker

## 2020-10-10 NOTE — Telephone Encounter (Signed)
CSW received return paperwork form patient and forwarded to the servant center for assistance with application for disability and food stamps. CSW available as needed. Lasandra Beech, LCSW, CCSW-MCS [phone]

## 2020-10-10 NOTE — Telephone Encounter (Signed)
Spoke with patient regarding the Wednesday 11/13/20 9:00 am Cardiac MRI appointment at [facility].  Arrival time is 8:30 am--1st floor admissions office for check in---will mail information to patient and he voiced his understanding.

## 2020-11-11 ENCOUNTER — Telehealth (HOSPITAL_COMMUNITY): Payer: Self-pay | Admitting: Emergency Medicine

## 2020-11-11 NOTE — Telephone Encounter (Signed)
vm box not set up  

## 2020-11-13 ENCOUNTER — Other Ambulatory Visit: Payer: Self-pay

## 2020-11-13 ENCOUNTER — Ambulatory Visit (HOSPITAL_COMMUNITY)
Admission: RE | Admit: 2020-11-13 | Discharge: 2020-11-13 | Disposition: A | Discharge status: Home / Self Care | Payer: Self-pay | Source: Ambulatory Visit | Attending: Thoracic Surgery (Cardiothoracic Vascular Surgery) | Admitting: Thoracic Surgery (Cardiothoracic Vascular Surgery)

## 2020-11-13 DIAGNOSIS — I25119 Atherosclerotic heart disease of native coronary artery with unspecified angina pectoris: Secondary | ICD-10-CM

## 2020-11-13 IMAGING — MR MR CARD MORPHOLOGY WO/W CM
45 of 48 series · 45 of 48 positions shown · IV contrast (Contrast agent)
Comparison: none

EXAM:
CARDIAC MRI
TECHNIQUE: The patient was scanned on a 1.5 Tesla Siemens magnet. A dedicated
cardiac coil was used. Functional imaging was done using Fiesta
sequences. [DATE], and 4 chamber views were done to assess for RWMA's.
Modified CAROLA rule using a short axis stack was used to
calculate an ejection fraction on a dedicated work station using
Circle software. The patient received 10 cc of Gadavist. After 10
minutes inversion recovery sequences were used to assess for
infiltration and scar tissue.

CONTRAST:  Gadavist

[Series 4: t2_[id number]_bh · axial · 8.0mm · 1.41mm/px · 1 of 16 slices shown]
[im 1/16]
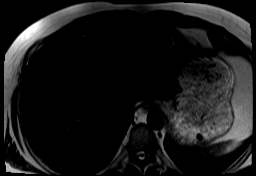

[Series 8: bSSFP · oblique · 8.0mm · 1.61mm/px · 1 of 25 slices shown (1 of 23)]
[im 1/25]
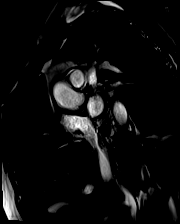

[Series 9: bSSFP · oblique · 8.0mm · 1.61mm/px · 1 of 25 slices shown (2 of 23)]
[im 1/25]
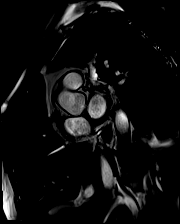

[Series 10: bSSFP · oblique · 8.0mm · 1.61mm/px · 1 of 25 slices shown (3 of 23)]
[im 1/25]
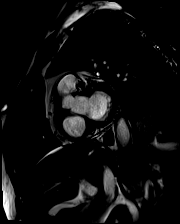

[Series 11: bSSFP · oblique · 8.0mm · 1.61mm/px · 1 of 25 slices shown (4 of 23)]
[im 1/25]
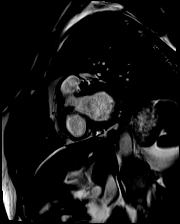

[Series 12: bSSFP · oblique · 8.0mm · 1.61mm/px · 1 of 25 slices shown (5 of 23)]
[im 1/25]
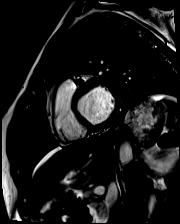

[Series 13: bSSFP · oblique · 8.0mm · 1.61mm/px · 1 of 25 slices shown (6 of 23)]
[im 1/25]
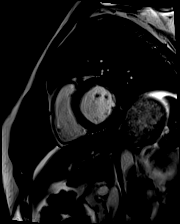

[Series 14: bSSFP · oblique · 8.0mm · 1.61mm/px · 1 of 25 slices shown (7 of 23)]
[im 1/25]
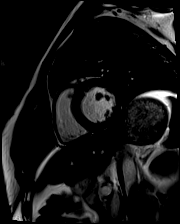

[Series 15: bSSFP · oblique · 8.0mm · 1.61mm/px · 1 of 25 slices shown (8 of 23)]
[im 1/25]
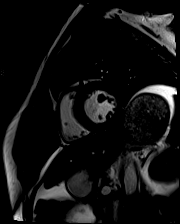

[Series 16: bSSFP · oblique · 8.0mm · 1.61mm/px · 1 of 25 slices shown (9 of 23)]
[im 1/25]
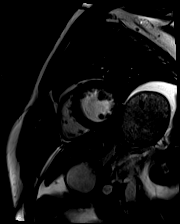

[Series 17: bSSFP · oblique · 8.0mm · 1.61mm/px · 1 of 25 slices shown (10 of 23)]
[im 1/25]
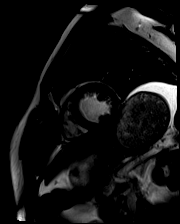

[Series 18: bSSFP · oblique · 8.0mm · 1.61mm/px · 1 of 25 slices shown (11 of 23)]
[im 1/25]
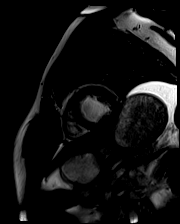

[Series 19: bSSFP · oblique · 8.0mm · 1.61mm/px · 1 of 25 slices shown (12 of 23)]
[im 1/25]
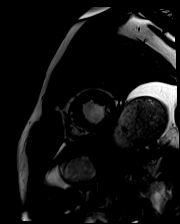

[Series 20: bSSFP · oblique · 8.0mm · 1.61mm/px · 1 of 25 slices shown (13 of 23)]
[im 1/25]
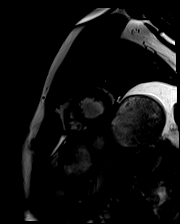

[Series 21: bSSFP · oblique · 8.0mm · 1.61mm/px · 1 of 25 slices shown (14 of 23)]
[im 1/25]
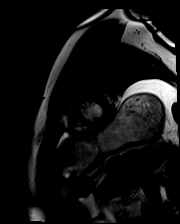

[Series 22: bSSFP · oblique · 8.0mm · 1.61mm/px · 1 of 25 slices shown (15 of 23)]
[im 1/25]
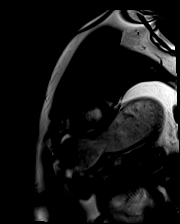

[Series 23: bSSFP · oblique · 8.0mm · 1.61mm/px · 1 of 25 slices shown (16 of 23)]
[im 1/25]
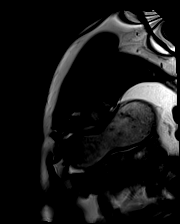

[Series 24: bSSFP · oblique · 8.0mm · 1.61mm/px · 1 of 25 slices shown (17 of 23)]
[im 1/25]
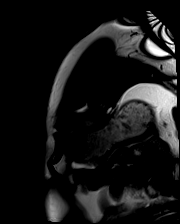

[Series 25: bSSFP · oblique · 8.0mm · 1.61mm/px · 1 of 25 slices shown (18 of 23)]
[im 1/25]
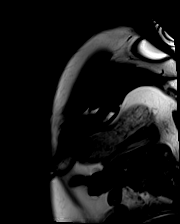

[Series 26: bSSFP · oblique · 8.0mm · 1.61mm/px · 1 of 25 slices shown (19 of 23)]
[im 1/25]
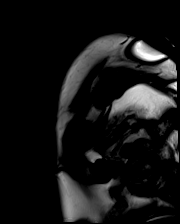

[Series 27: bSSFP · oblique · 6.0mm · 1.41mm/px · 1 of 25 slices shown (20 of 23)]
[im 1/25]
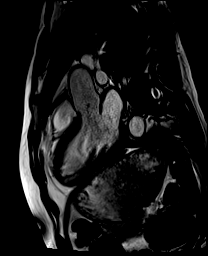

[Series 28: bSSFP · sagittal · 6.0mm · 1.41mm/px · 1 of 25 slices shown (21 of 23)]
[im 1/25]
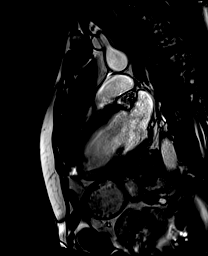

[Series 29: bSSFP · axial · 6.0mm · 1.41mm/px · 1 of 25 slices shown (22 of 23)]
[im 1/25]
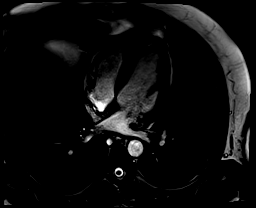

[Series 30: (id)_long_t1 · oblique · 8.0mm · 2.08mm/px · 1 of 24 slices shown]
[im 1/24]
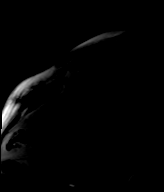

[Series 31: (id)_long_t1_moco · oblique · 8.0mm · 2.08mm/px · 1 of 24 slices shown]
[im 1/24]
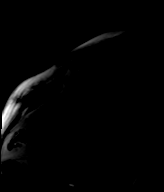

[Series 32: (id)_long_t1_moco_t1 · 1 of 3 slices shown (1 of 2)]
[im 1/3]
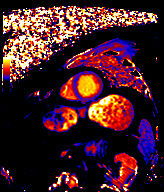

[Series 32: (id)_long_t1_moco_t1 · oblique · 8.0mm · 2.08mm/px · 1 of 3 slices shown (2 of 2)]
[im 1/3]
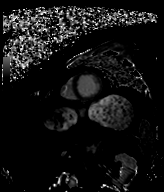

[Series 34: (id)_trufi · oblique · 8.0mm · 2.08mm/px · 1 of 9 slices shown]
[im 1/9]
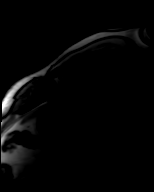

[Series 35: (id)_trufi_moco · oblique · 8.0mm · 2.08mm/px · 1 of 9 slices shown]
[im 1/9]
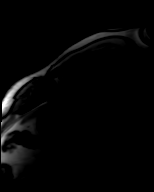

[Series 36: (id)_trufi_moco_t2 · oblique · 8.0mm · 2.08mm/px · 1 of 3 slices shown]
[im 1/3]
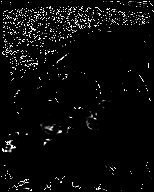

[Series 38: pre short axis · oblique · non-contrast · 8.0mm · 2.25mm/px · 1 of 10 slices shown (1 of 6)]
[im 1/10]
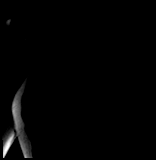

[Series 39: pre short axis · oblique · non-contrast · 8.0mm · 2.25mm/px · 1 of 10 slices shown (2 of 6)]
[im 1/10]
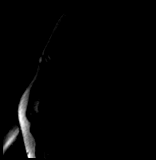

[Series 40: pre short axis · oblique · non-contrast · 8.0mm · 2.25mm/px · 1 of 10 slices shown (3 of 6)]
[im 1/10]
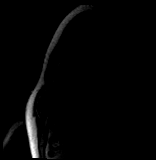

[Series 41: pre short axis · oblique · non-contrast · 8.0mm · 2.25mm/px · 1 of 10 slices shown (4 of 6)]
[im 1/10]
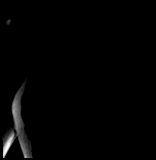

[Series 42: pre short axis · oblique · non-contrast · 8.0mm · 2.25mm/px · 1 of 10 slices shown (5 of 6)]
[im 1/10]
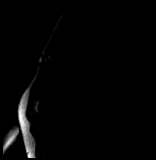

[Series 43: pre short axis · oblique · non-contrast · 8.0mm · 2.25mm/px · 1 of 10 slices shown (6 of 6)]
[im 1/10]
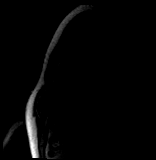

[Series 44: rest short axis · oblique · 8.0mm · 2.25mm/px · 1 of 60 slices shown (1 of 6)]
[im 1/60]
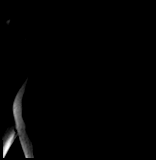

[Series 45: rest short axis · oblique · 8.0mm · 2.25mm/px · 1 of 60 slices shown (2 of 6)]
[im 1/60]
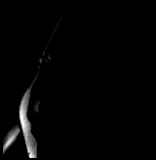

[Series 46: rest short axis · oblique · 8.0mm · 2.25mm/px · 1 of 60 slices shown (3 of 6)]
[im 1/60]
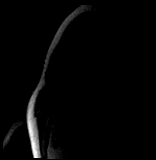

[Series 47: rest short axis · oblique · 8.0mm · 2.25mm/px · 1 of 60 slices shown (4 of 6)]
[im 1/60]
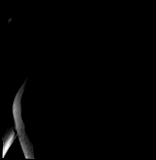

[Series 48: rest short axis · oblique · 8.0mm · 2.25mm/px · 1 of 60 slices shown (5 of 6)]
[im 1/60]
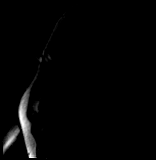

[Series 49: rest short axis · oblique · 8.0mm · 2.25mm/px · 1 of 60 slices shown (6 of 6)]
[im 1/60]
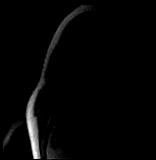

[Series 50: bSSFP · coronal · 6.0mm · 1.41mm/px · 1 of 25 slices shown (23 of 23)]
[im 1/25]
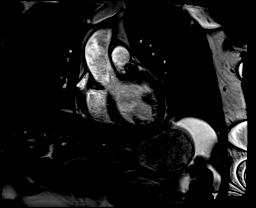

[Series 51: cine rvit · sagittal · 6.0mm · 1.41mm/px · 1 of 25 slices shown]
[im 1/25]
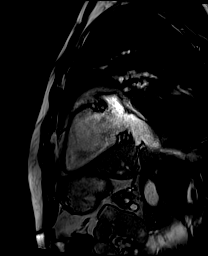

[Series 52: aortic valve cine · oblique · 6.0mm · 1.41mm/px · 1 of 25 slices shown]
[im 1/25]
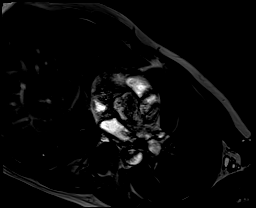

[45 of 48 positions shown; findings below may reference images not displayed]

FINDINGS: Normal atrial sizes. No PFO/ASD. Prominent epicardial fat no
effusion. Tri leaflet AV with mild appearing AR Normal aortic root
3.4 cm Normal mitral valve with

Mild appearing MR. Normal RV size and function Normal TV with mild
appearing TR. Normal PV and RVOT

Quantitative EF 40% (EDV 213 cc ESV 127 cc SV 85 cc)

There was hypokinesis of the distal anterior wall, apex septum as
well as the apical and mid inferior wall

Delayed enhancement images showed small area of sub endocardial scar
in the mid and apical inferior wall
IMPRESSION: 1. Mild LVE with RWMAls distal septum anterior wall inferior apex
and mid inferior wall. EF 40%

2. Viable myocardium No areas of thinning or akinesis. Small area of
subendocardial scar in the mid and apical inferior CAROLA

CAROLA

## 2020-11-13 MED ORDER — GADOBUTROL 1 MMOL/ML IV SOLN
10.0000 mL | Freq: Once | INTRAVENOUS | Status: AC | PRN
  Administered 2020-11-13: 10 mL via INTRAVENOUS

## 2020-11-14 ENCOUNTER — Telehealth: Payer: Self-pay | Admitting: Licensed Clinical Social Worker

## 2020-11-14 NOTE — Telephone Encounter (Signed)
CSW received confirmation from the [facility] that the [facility] application has been submitted. CSW available as needed. Lasandra Beech, LCSW, CCSW-MCS [phone]

## 2020-12-27 ENCOUNTER — Telehealth: Payer: Self-pay

## 2020-12-27 NOTE — Telephone Encounter (Signed)
-----   Message from Corliss Skains, MD sent at 12/26/2020  4:32 PM EDT ----- Regarding: RE: Cardiac MRI Contact: [phone] He has viable myocardium.  He can be scheduled for an inperson visit to discuss the results  Corliss Skains  ----- Message ----- From: Joycelyn Schmid, LPN Sent: 7/51/0258   4:32 PM EDT To: Corliss Skains, MD Subject: Cardiac MRI                                    Mr Keech is calling about the Cardiac MRI he had back on 11/13/2020. He is requesting the results. Please review and advise. Thanks Roselyn Bering

## 2020-12-31 DIAGNOSIS — Z736 Limitation of activities due to disability: Secondary | ICD-10-CM

## 2021-01-14 ENCOUNTER — Telehealth: Payer: Self-pay | Admitting: Cardiology

## 2021-01-14 NOTE — Telephone Encounter (Signed)
    Pt is calling said someone try to contact Dr. Dulce Sellar for his disability paper work if Dr. Dulce Sellar recommend for pt to get Lexiscan or thread mill stress test. He is checking in if Dr. Dulce Sellar received the request

## 2021-01-14 NOTE — Telephone Encounter (Signed)
Spoke to the patient just now and he states that his disability is needing documentation from [place].   His letter states "Please contact your cardiologist and see if they can give you a note stating whether in their opinion undergoing a stress test would pose a significant risk to you."  He states that he needs to get this turned in by 01/18/2021.   I advised that Dr. Dulce Sellar was out of town and I was unsure if Dr. Servando Salina would feel comfortable making this decision but I would reach out to her to see.    He also gave me the phone number for his disability consultant and it is listed below.  [phone]

## 2021-01-14 NOTE — Telephone Encounter (Signed)
Please let the patient know that Dr. Dulce Sellar as of this week.  I will prefer he takes care of this when he gets back

## 2021-01-19 NOTE — Progress Notes (Signed)
Cardiology Office Note:    Date:  01/20/2021   ID:  Adam James, DOB 04-27-1971, MRN [id number]  PCP:  Patient, No Pcp Per (Inactive)  Cardiologist:  Norman Herrlich, MD    Referring MD: No ref. provider found    ASSESSMENT:    1. Coronary artery disease involving native coronary artery of native heart with angina pectoris (HCC)   2. Cardiomyopathy, unspecified type (HCC)   3. Dyslipidemia   4. Noncompliance    PLAN:    In order of problems listed above:  He has financial barriers to healthcare is out of all medications we will put a consult in for social work direct him to local [facility] which at times can reach out and help people and hopefully can get back on needed therapy for his CAD severe cardiomyopathy and dyslipidemia.  Fortunately although he is mildly short of breath and his weight is up he is not having severe symptoms of heart failure and he is not having chest pain at this time I explained to him that records to be sent when requested by Social Security disability he has severe underlying heart disease severe cardiomyopathy and this should give adequate information to make a determination at their level.   Next appointment: 6 months   Medication Adjustments/Labs and Tests Ordered: Current medicines are reviewed at length with the patient today.  Concerns regarding medicines are outlined above.  Orders Placed This Encounter  Procedures   Ambulatory referral to Social Work   No orders of the defined types were placed in this encounter.   Chief Complaint  Patient presents with   Follow-up    History of Present Illness:    Adam James is a 50 y.o. male with a hx of CAD with severe LV dysfunction dyslipidemia type 2 diabetes and diabetic foot ulcer.  Coronary angiography in February showed 50% distal left main stenosis along with complete occlusion of the right coronary artery 90% ostial LAD stenosis 100% stenosis distal LAD 70% left circumflex was referred to  cardiovascular surgery for evaluation for CABG and did not comply with a cardiac MR for functional viability determination and he deferred bypass surgery.  He was last seen 10/07/2020. Compliance with diet, lifestyle and medications: No he is been out of all medications for several weeks he states he does not have the money to afford them  He has been out of all of his medications for several weeks and does not feel as well. Weight is up 10 pounds or greater and he has exertional shortness of breath when he climbs stairs no orthopnea or PND No chest pain palpitation or syncope. He asked me if I had correspondence from Social Security disability about needing a stress test I told him there is been no communication to my office. Ask of I will give him a note that he is disabled and I told him that I do not do expert witness. He has no financial resources and he is in the process of Social Security disability determination. I directed him to the local [facility] that at times has a voucher program for prescriptions and will put a consult in for social work for access to healthcare and medications. He was given samples of aspirin in our office. Past Medical History:  Diagnosis Date   CAD (coronary artery disease)    Cardiomyopathy (HCC)    Diabetic foot ulcer (HCC)    Dyslipidemia    Heart failure (HCC)    Type 2 diabetes mellitus (HCC)  Past Surgical History:  Procedure Laterality Date   CARDIAC CATHETERIZATION     LEFT HEART CATH AND CORONARY ANGIOGRAPHY N/A 09/13/2020   Procedure: LEFT HEART CATH AND CORONARY ANGIOGRAPHY;  Surgeon: Lennette Bihari, MD;  Location: [facility];  Service: Cardiovascular;  Laterality: N/A;   TOE AMPUTATION  2020    Current Medications: Current Meds  Medication Sig   amLODipine (NORVASC) 5 MG tablet Take 1 tablet (5 mg total) by mouth daily.   aspirin EC 81 MG tablet Take 1 tablet (81 mg total) by mouth daily. Swallow whole.   atorvastatin (LIPITOR) 80  MG tablet Take 1 tablet (80 mg total) by mouth daily.   furosemide (LASIX) 40 MG tablet Take 40 mg by mouth daily. Take 40 mg every other day alternating with 2 tablets (80mg ) every other day   glyBURIDE-metformin (GLUCOVANCE) 5-500 MG tablet Take 1 tablet by mouth 2 (two) times daily with a meal.   irbesartan (AVAPRO) 75 MG tablet Take 75 mg by mouth daily.   metoprolol succinate (TOPROL-XL) 25 MG 24 hr tablet Take 25 mg by mouth daily.   silver sulfADIAZINE (SILVADENE) 1 % cream Apply 1 application topically daily.     Allergies:   Morphine   Social History   Socioeconomic History   Marital status: Single    Spouse name: Not on file   Number of children: Not on file   Years of education: Not on file   Highest education level: Not on file  Occupational History   Not on file  Tobacco Use   Smoking status: Every Day    Pack years: 0.00    Types: Cigarettes   Smokeless tobacco: Never   Tobacco comments:    5-6 daily  Vaping Use   Vaping Use: Never used  Substance and Sexual Activity   Alcohol use: Not Currently    Comment: Very rare   Drug use: Not Currently    Types: Marijuana   Sexual activity: Not on file  Other Topics Concern   Not on file  Social History Narrative   Not on file   Social Determinants of Health   Financial Resource Strain: High Risk   Difficulty of Paying Living Expenses: Very hard  Food Insecurity: Food Insecurity Present   Worried About Running Out of Food in the Last Year: Sometimes true   Ran Out of Food in the Last Year: Sometimes true  Transportation Needs: No Transportation Needs   Lack of Transportation (Medical): No   Lack of Transportation (Non-Medical): No  Physical Activity: Not on file  Stress: Not on file  Social Connections: Not on file     Family History: The patient's family history includes Congestive Heart Failure in his brother; Heart failure in his father; Hypertension in his brother and mother. ROS:   Please see the  history of present illness.    All other systems reviewed and are negative.  EKGs/Labs/Other Studies Reviewed:    The following studies were reviewed today:    Recent Labs: 09/09/2020: Hemoglobin 13.3; Platelets 276 10/07/2020: BUN 26; Creatinine, Ser 1.31; NT-Pro BNP 468; Potassium 4.7; Sodium 138  Recent Lipid Panel    Component Value Date/Time   CHOL 167 10/07/2020 1004   TRIG 125 10/07/2020 1004   HDL 36 (L) 10/07/2020 1004   CHOLHDL 4.6 10/07/2020 1004   LDLCALC 108 (H) 10/07/2020 1004    Physical Exam:    VS:  BP 104/76 (BP Location: Left Arm, Patient Position: Sitting, Cuff  Size: Normal)   Pulse 96   Ht 6\' 3"  (1.905 m)   Wt 247 lb (112 kg)   SpO2 98%   BMI 30.87 kg/m     Wt Readings from Last 3 Encounters:  01/20/21 247 lb (112 kg)  10/07/20 231 lb (104.8 kg)  09/18/20 230 lb (104.3 kg)     GEN:  Well nourished, well developed in no acute distress HEENT: Normal NECK: No JVD; No carotid bruits LYMPHATICS: No lymphadenopathy CARDIAC: RRR, no murmurs, rubs, gallops RESPIRATORY:  Clear to auscultation without rales, wheezing or rhonchi  ABDOMEN: Soft, non-tender, non-distended MUSCULOSKELETAL:  No edema; No deformity  SKIN: Warm and dry NEUROLOGIC:  Alert and oriented x 3 PSYCHIATRIC:  Normal affect    Signed, 09/20/20, MD  01/20/2021 10:07 AM    [facility]

## 2021-01-20 ENCOUNTER — Other Ambulatory Visit: Payer: Self-pay

## 2021-01-20 ENCOUNTER — Encounter: Payer: Self-pay | Admitting: Cardiology

## 2021-01-20 ENCOUNTER — Ambulatory Visit (INDEPENDENT_AMBULATORY_CARE_PROVIDER_SITE_OTHER): Payer: Self-pay | Admitting: Cardiology

## 2021-01-20 ENCOUNTER — Telehealth: Payer: Self-pay | Admitting: Licensed Clinical Social Worker

## 2021-01-20 VITALS — BP 104/76 | HR 96 | Ht 75.0 in | Wt 247.0 lb

## 2021-01-20 DIAGNOSIS — E785 Hyperlipidemia, unspecified: Secondary | ICD-10-CM

## 2021-01-20 DIAGNOSIS — I255 Ischemic cardiomyopathy: Secondary | ICD-10-CM

## 2021-01-20 DIAGNOSIS — Z9119 Patient's noncompliance with other medical treatment and regimen: Secondary | ICD-10-CM

## 2021-01-20 DIAGNOSIS — Z91199 Patient's noncompliance with other medical treatment and regimen due to unspecified reason: Secondary | ICD-10-CM

## 2021-01-20 DIAGNOSIS — I429 Cardiomyopathy, unspecified: Secondary | ICD-10-CM

## 2021-01-20 DIAGNOSIS — E118 Type 2 diabetes mellitus with unspecified complications: Secondary | ICD-10-CM

## 2021-01-20 DIAGNOSIS — I25119 Atherosclerotic heart disease of native coronary artery with unspecified angina pectoris: Secondary | ICD-10-CM

## 2021-01-20 DIAGNOSIS — I25118 Atherosclerotic heart disease of native coronary artery with other forms of angina pectoris: Secondary | ICD-10-CM

## 2021-01-20 MED ORDER — IRBESARTAN 75 MG PO TABS
75.0000 mg | ORAL_TABLET | Freq: Every day | ORAL | 3 refills | Status: DC
  Filled 2021-01-20: qty 30, 30d supply, fill #0

## 2021-01-20 MED ORDER — METOPROLOL SUCCINATE ER 25 MG PO TB24
25.0000 mg | ORAL_TABLET | Freq: Every day | ORAL | 3 refills | Status: DC
  Filled 2021-01-20: qty 30, 30d supply, fill #0

## 2021-01-20 MED ORDER — ASPIRIN EC 81 MG PO TBEC
81.0000 mg | DELAYED_RELEASE_TABLET | Freq: Every day | ORAL | 3 refills | Status: DC
  Filled 2021-01-20: qty 90, 90d supply, fill #0

## 2021-01-20 MED ORDER — FUROSEMIDE 40 MG PO TABS
40.0000 mg | ORAL_TABLET | Freq: Every day | ORAL | 3 refills | Status: DC
  Filled 2021-01-20: qty 90, 90d supply, fill #0

## 2021-01-20 MED ORDER — ATORVASTATIN CALCIUM 80 MG PO TABS
80.0000 mg | ORAL_TABLET | Freq: Every day | ORAL | 3 refills | Status: DC
  Filled 2021-01-20: qty 30, 30d supply, fill #0

## 2021-01-20 MED ORDER — AMLODIPINE BESYLATE 5 MG PO TABS
5.0000 mg | ORAL_TABLET | Freq: Every day | ORAL | 3 refills | Status: DC
  Filled 2021-01-20: qty 30, 30d supply, fill #0

## 2021-01-20 NOTE — Patient Instructions (Signed)

## 2021-01-20 NOTE — Progress Notes (Signed)
Heart and Vascular Care Navigation  01/20/2021  Adam James [birth year] [id number]  Reason for Referral:  Engaged with patient by telephone for initial visit for Heart and Vascular Care Coordination.                                                                                                   Assessment:             LCSW called pt per request of [facility] [place] office.  Was able to reach pt at [phone]. Introduced self, role, reason for call. Pt confirmed home address, shares he does not have a PCP but is trying to get in to see the [facility] clinic. LCSW shared pt would also be able to see a provider at [facility] or another [facility] community clinic if interested. Pt lives at home with his daughter, [place], who currently has been providing most of the financial support for living expenses. Pt shares that he is currently unemployed, is frustrated by pending disability which it appears was filed w/ [employer] following referral by my team lead Otis, LCSW. Pt shares that he does receive SNAP. He had run out of his medications about two weeks ago and "didn't want to bother anyone." I provided support for how it made him feel and shared that he should always reach out to the pharmacy/doctors office when he has about a week of medications left and if he runs in to any hardship there are oftentimes assistance programs offices can offer. Pt mother usually drives him to appointments. I offered to have him enrolled in [employer] and he is open to that.   Shared that we could assist with fill of medications at this time through our [facility] pharmacy and arrange him a ride there if his mother cannot bring him. Pt open to this. Shared that he may be eligible for additional assistance through [facility] and [employer]. Inquired if pt interested in me sending that information along with instructions and my card through to his current home address. Pt open to  this. No additional concerns/questions at this time for this Clinical research associate.            HRT/VAS Care Coordination     Patients Home Cardiology Office [facility]   Outpatient Care Team Social Worker   Social Worker Name: Octavio Graves, LCSW, [facility]   Living arrangements for the past 2 months Mobile Home   Lives with: Adult Children   Patient Current Insurance Coverage Self-Pay   Patient Has Concern With Paying Medical Bills Yes   Patient Concerns With Medical Bills ongoing medical care, past unpaid expenses   Medical Bill Referrals: mailed CAFA form, disability pending   Does Patient Have Prescription Coverage? No   Patient Prescription Assistance Programs [place] [facility]; Other    Med[facility]ist Medications mailed application   Other Assistance Programs Medications assistance financially provided at [facility] on 6/28       Social History:  SDOH Screenings   Alcohol Screen: Not on file  Depression (PHQ2-9): Not on file  Financial Resource Strain: High Risk   Difficulty of Paying Living Expenses: Very hard  Food Insecurity: Food Insecurity Present   Worried About Running Out of Food in the Last Year: Sometimes true   Ran Out of Food in the Last Year: Sometimes true  Housing: Low Risk    Last Housing Risk Score: 0  Physical Activity: Not on file  Social Connections: Not on file  Stress: Not on file  Tobacco Use: High Risk   Smoking Tobacco Use: Every Day   Smokeless Tobacco Use: Never  Transportation Needs: Unmet Transportation Needs   Lack of Transportation (Medical): Yes   Lack of Transportation (Non-Medical): Yes    SDOH Interventions: Financial Resources:  Corporate treasurer Interventions: Artist, Other (Comment) (provided pt with info about [facility] and pharmacy, enrolled in [employer], assisted w/ medication costs, mailed [employer] application and CAFA forms) Agricultural engineer for [employer] Program  Food Insecurity:   Food Insecurity Interventions: Pt recieves SNAP at this time  Housing Insecurity:  Housing Interventions: Intervention Not Indicated  Transportation:   Transportation Interventions: Retail banker    Other Care Navigation Interventions:     Provided Pharmacy assistance resources [place] [facility], Other   Follow-up plan:   LCSW will f/u with [facility] in the morning to assess costs and enrollment into [employer]. LCSW has sent referral form to [employer]. I have also contacted [place] office to have medications transferred. Pt has my number for any additional questions/concerns.

## 2021-01-21 ENCOUNTER — Other Ambulatory Visit: Payer: Self-pay

## 2021-01-21 ENCOUNTER — Telehealth: Payer: Self-pay | Admitting: Licensed Clinical Social Worker

## 2021-01-21 ENCOUNTER — Telehealth: Payer: Self-pay

## 2021-01-21 NOTE — Telephone Encounter (Signed)
Spoke with pt this morning regarding potential ride to pharmacy today. Transportation Services is working to identify if we can get a ride today from [name] to [employer]. He is still ready/available. Will assist with cost at [facility] of meds which are $22. Once ride confirmed I will contact pt and let him know about pick up.   Octavio Graves, MSW, LCSW [facility] Heart/Vascular Care Navigation  [phone]

## 2021-01-21 NOTE — Telephone Encounter (Signed)
Unfortunately, per [employer], no rides available for pt until Thursday.  I have updated pt of this fact, encouraged him to see if his mother can take him before then. Medications will be covered and ready this afternoon. If pt mother cannot drive him, [employer] will be in touch with him today and ensure he has a ride.   Octavio Graves, MSW, LCSW [facility] Heart/Vascular Care Navigation  [phone]

## 2021-01-21 NOTE — Telephone Encounter (Signed)
   Sy Saintjean DOB: 1970-08-21 MRN: [id number]   RIDER WAIVER AND RELEASE OF LIABILITY  For purposes of improving physical access to our facilities, [facility] is pleased to partner with third parties to provide [facility] patients or other authorized individuals the option of convenient, on-demand ground transportation services (the [employer]") through use of the technology service that enables users to request on-demand ground transportation from independent third-party providers.  By opting to use and accept these [employer], I, the undersigned, hereby agree on behalf of myself, and on behalf of any minor child using the Science writer for whom I am the parent or legal guardian, as follows:  Science writer provided to me are provided by independent third-party transportation providers who are not [name] or employees and who are unaffiliated with [employer]. [facility] is neither a transportation carrier nor a common or public carrier. [facility] has no control over the quality or safety of the transportation that occurs as a result of the [employer]. [facility] cannot guarantee that any third-party transportation provider will complete any arranged transportation service. [facility] makes no representation, warranty, or guarantee regarding the reliability, timeliness, quality, safety, suitability, or availability of any of the Transport Services or that they will be error free. I fully understand that traveling by vehicle involves risks and dangers of serious bodily injury, including permanent disability, paralysis, and death. I agree, on behalf of myself and on behalf of any minor child using the Transport Services for whom I am the parent or legal guardian, that the entire risk arising out of my use of the [employer] remains solely with me, to the maximum extent permitted under applicable law. The [employer] are provided "as  is" and "as available." [facility] disclaims all representations and warranties, express, implied or statutory, not expressly set out in these terms, including the implied warranties of merchantability and fitness for a particular purpose. I hereby waive and release [facility], its agents, employees, officers, directors, representatives, insurers, attorneys, assigns, successors, subsidiaries, and affiliates from any and all past, present, or future claims, demands, liabilities, actions, causes of action, or suits of any kind directly or indirectly arising from acceptance and use of the [employer]. I further waive and release [facility] and its affiliates from all present and future liability and responsibility for any injury or death to persons or damages to property caused by or related to the use of the [employer]. I have read this Waiver and Release of Liability, and I understand the terms used in it and their legal significance. This Waiver is freely and voluntarily given with the understanding that my right (as well as the right of any minor child for whom I am the parent or legal guardian using the [employer]) to legal recourse against  in connection with the [employer] is knowingly surrendered in return for use of these services.   I attest that I read the consent document to [name], gave Mr. Pecina the opportunity to ask questions and answered the questions asked (if any). I affirm that Avien Taha then provided consent for he's participation in this program.     Ephriam Knuckles Vilsaint

## 2021-01-23 ENCOUNTER — Other Ambulatory Visit: Payer: Self-pay

## 2021-01-24 ENCOUNTER — Ambulatory Visit: Payer: Medicaid Other | Admitting: Thoracic Surgery (Cardiothoracic Vascular Surgery)

## 2021-01-24 ENCOUNTER — Other Ambulatory Visit: Payer: Self-pay

## 2021-02-07 ENCOUNTER — Ambulatory Visit: Payer: Medicaid Other | Admitting: Thoracic Surgery (Cardiothoracic Vascular Surgery)

## 2021-02-07 ENCOUNTER — Telehealth: Payer: Self-pay | Admitting: Licensed Clinical Social Worker

## 2021-02-07 NOTE — Telephone Encounter (Signed)
LCSW spoke with pt this morning via telephone at [phone].  Re-introduced self, role, reason for call. Pt confirms he has completed the applications (had some delay with getting things notarized), plans to mail them in.  Clarified address is still [address], [place], [place]; pt address some how had changed to [facility] [address]- changed back to [place] address. Let pt know that I remain available as needed. Will f/u in two weeks with [facility] and [facility] to ensure applications received.    Octavio Graves, MSW, LCSW [facility] Heart/Vascular Care Navigation  [phone]

## 2021-02-11 ENCOUNTER — Ambulatory Visit: Payer: Medicaid Other

## 2021-02-18 ENCOUNTER — Other Ambulatory Visit: Payer: Self-pay | Admitting: Thoracic Surgery (Cardiothoracic Vascular Surgery)

## 2021-02-18 ENCOUNTER — Ambulatory Visit: Payer: Medicaid Other

## 2021-02-18 ENCOUNTER — Ambulatory Visit (INDEPENDENT_AMBULATORY_CARE_PROVIDER_SITE_OTHER): Payer: Self-pay | Admitting: Thoracic Surgery (Cardiothoracic Vascular Surgery)

## 2021-02-18 ENCOUNTER — Encounter: Payer: Self-pay | Admitting: Thoracic Surgery (Cardiothoracic Vascular Surgery)

## 2021-02-18 ENCOUNTER — Other Ambulatory Visit: Payer: Self-pay

## 2021-02-18 ENCOUNTER — Telehealth: Payer: Self-pay | Admitting: Licensed Clinical Social Worker

## 2021-02-18 ENCOUNTER — Other Ambulatory Visit: Payer: Self-pay | Admitting: *Deleted

## 2021-02-18 VITALS — BP 77/51 | HR 105 | Resp 20 | Ht 75.0 in | Wt 242.0 lb

## 2021-02-18 DIAGNOSIS — I25119 Atherosclerotic heart disease of native coronary artery with unspecified angina pectoris: Secondary | ICD-10-CM

## 2021-02-18 NOTE — Progress Notes (Signed)
[address]       [place] [address]             [phone]        Adam James [facility] Medical Record #[id number] Date of Birth: Jul 05, 1971  Referring: Lennette Bihari, MD Primary Care: Patient, No Pcp Per (Inactive) Primary Cardiologist:None  Chief Complaint:    Chief Complaint  Patient presents with   Coronary Artery Disease    F/u from cardiac MRI 11/13/20, Echo & cath 2/18    History of Present Illness:     [name] presents in follow-up after undergoing his cardiac MRI.  This was performed in April of this year.  It did show viability.  His ejection fraction was 40%.  From a cardiac standpoint he occasionally has some anginal symptoms, but mostly complains of dyspnea, and excessive tiredness.  He would like to proceed with surgery however.   Past Medical and Surgical History: Previous Chest Surgery: None Previous Chest Radiation: No Diabetes Mellitus: Yes.  HbA1C pending   Past Medical History:  Diagnosis Date   CAD (coronary artery disease)    Cardiomyopathy (HCC)    Diabetic foot ulcer (HCC)    Dyslipidemia    Heart failure (HCC)    Type 2 diabetes mellitus (HCC)     Past Surgical History:  Procedure Laterality Date   CARDIAC CATHETERIZATION     LEFT HEART CATH AND CORONARY ANGIOGRAPHY N/A 09/13/2020   Procedure: LEFT HEART CATH AND CORONARY ANGIOGRAPHY;  Surgeon: Lennette Bihari, MD;  Location: [facility] INVASIVE CV LAB;  Service: Cardiovascular;  Laterality: N/A;   TOE AMPUTATION  2020    Social History: Support: He lives with his daughter  Social History   Tobacco Use  Smoking Status Every Day   Types: Cigarettes  Smokeless Tobacco Never  Tobacco Comments   5-6 daily    Social History   Substance and Sexual Activity  Alcohol Use Not Currently   Comment: Very rare     Allergies  Allergen Reactions   Morphine Itching      Current Outpatient Medications  Medication Sig Dispense Refill   amLODipine  (NORVASC) 5 MG tablet Take 1 tablet (5 mg total) by mouth daily. 90 tablet 3   aspirin EC 81 MG tablet Take 1 tablet (81 mg total) by mouth daily. Swallow whole. 90 tablet 3   atorvastatin (LIPITOR) 80 MG tablet Take 1 tablet (80 mg total) by mouth daily. 90 tablet 3   furosemide (LASIX) 40 MG tablet Take 1 tablet (40 mg total) by mouth daily. Take 40 mg every other day alternating with 2 tablets (80mg ) every other day 90 tablet 3   glyBURIDE-metformin (GLUCOVANCE) 5-500 MG tablet Take 1 tablet by mouth 2 (two) times daily with a meal.     irbesartan (AVAPRO) 75 MG tablet Take 1 tablet (75 mg total) by mouth daily. 90 tablet 3   metoprolol succinate (TOPROL-XL) 25 MG 24 hr tablet Take 1 tablet (25 mg total) by mouth daily. 90 tablet 3   silver sulfADIAZINE (SILVADENE) 1 % cream Apply 1 application topically daily.     No current facility-administered medications for this visit.    (Not in a hospital admission)   Family History  Problem Relation Age of Onset   Hypertension Mother    Heart failure Father    Hypertension Brother    Congestive Heart Failure Brother      Review of Systems:   Review  of Systems  Constitutional:  Positive for malaise/fatigue.  Cardiovascular:  Positive for chest pain.     Physical Exam: BP (!) 77/51 (BP Location: Left Arm, Patient Position: Sitting)   Pulse (!) 105   Resp 20   Ht 6\' 3"  (1.905 m)   Wt 242 lb (109.8 kg)   SpO2 93% Comment: RA  BMI 30.25 kg/m  Physical Exam Constitutional:      General: He is not in acute distress.    Appearance: Normal appearance. He is not ill-appearing.  Cardiovascular:     Rate and Rhythm: Tachycardia present.  Pulmonary:     Effort: Pulmonary effort is normal.  Abdominal:     General: There is no distension.  Musculoskeletal:        General: Normal range of motion.     Cervical back: Normal range of motion.  Skin:    General: Skin is warm.  Neurological:     General: No focal deficit present.      Mental Status: He is alert and oriented to person, place, and time.      Diagnostic Studies & Laboratory data:       I have independently reviewed the above radiologic studies and discussed with the patient   Recent Lab Findings: Lab Results  Component Value Date   WBC 10.5 09/09/2020   HGB 13.3 09/09/2020   HCT 39.3 09/09/2020   PLT 276 09/09/2020   GLUCOSE 276 (H) 10/07/2020   CHOL 167 10/07/2020   TRIG 125 10/07/2020   HDL 36 (L) 10/07/2020   LDLCALC 108 (H) 10/07/2020   NA 138 10/07/2020   K 4.7 10/07/2020   CL 102 10/07/2020   CREATININE 1.31 (H) 10/07/2020   BUN 26 (H) 10/07/2020   CO2 23 10/07/2020      Assessment / Plan:   50 year old male with severe three-vessel coronary artery disease and congestive heart failure.  Cardiac MRI did show viability.  He would like to proceed with surgery.  His last left heart cath was done in February of this year.  From a symptomatic standpoint he is doing better.  He has not completely been compliant with his medications but at his last visit with Dr.Munley he was given new prescriptions.  We discussed the risks and benefits of surgical revascularization along with a left radial harvest and he would like to proceed.  We will obtain a echocardiogram during his preop evaluation.  If his function is much decreased, or his labs are significantly elevated then we will reevaluate things and optimize him medically prior to surgery. He is tentatively scheduled for early August.    I  spent 40 minutes counseling the patient face to face.   September 02/18/2021 5:34 PM

## 2021-02-18 NOTE — Telephone Encounter (Signed)
LCSW received pt paperwork for [facility] and [employer].   [facility] application is complete, was able to securely send to [email or web address]. Will update pt when application processed and assist with having medications sent to their pharmacy.   [facility] missing the following paperwork: letter of nonfiling (LCSW has faxed in 4506-t), food stamp assistance letter and 3 months of bank statements. This update was mailed to pt along with two additional documents not needed for applications back to pt home address.   Please see the letter below:  Adam James, I have received your applications for the following: [facility] and [facility]. We were able to submit your [facility] application and I will let you know once that has been reviewed and what medications they will mail to you!  We will still need a few documents from you, to send in the [facility] Financial Aid for consideration.   Proof of income-  1. Letter of non-filing from last year: keep your eyes open on your mail for this! I faxed in the 4506-T you signed, to request this letter be sent to you, often it will take 5-10 days for the letter to be mailed. If you have not heard back in 10 days you can call [phone] and make the request again.  2. Letter of award for food stamps: if you receive food benefits, the local Department of Social Services will be able to print you a copy of that letter with the amount you receive. If you have a copy of this, you can also mail it back to me OR bring to the [facility] office. It must be a letter with the amount received! Give me a call and let me know what is easiest once you have the letter [phone]).   Proof of assets-  You noted that you have a bank account. We need the last 90 days of complete checking or savings (or both) statements. Usually banks are able to print these off for you or mail them to you if requested.    Thank you again for  your time, call me with any questions, [email or web address] Work Cell: [phone] Clinical Social Worker II [facility]

## 2021-02-19 ENCOUNTER — Telehealth: Payer: Self-pay | Admitting: Licensed Clinical Social Worker

## 2021-02-19 NOTE — Telephone Encounter (Signed)
Received update from [facility] that we are missing patient proof of address w/ his application.  I printed facesheet and securely e-mailed that to Faith, SW w/ [employer] program. Await determination.   Octavio Graves, MSW, LCSW [facility] Heart/Vascular Care Navigation  [phone]

## 2021-02-20 ENCOUNTER — Ambulatory Visit (HOSPITAL_COMMUNITY)
Admission: RE | Admit: 2021-02-20 | Discharge: 2021-02-20 | Disposition: A | Discharge status: Home / Self Care | Payer: Medicaid Other | Source: Ambulatory Visit | Attending: Thoracic Surgery (Cardiothoracic Vascular Surgery) | Admitting: Thoracic Surgery (Cardiothoracic Vascular Surgery)

## 2021-02-20 ENCOUNTER — Other Ambulatory Visit: Payer: Self-pay

## 2021-02-20 ENCOUNTER — Ambulatory Visit (HOSPITAL_BASED_OUTPATIENT_CLINIC_OR_DEPARTMENT_OTHER)
Admission: RE | Admit: 2021-02-20 | Discharge: 2021-02-20 | Disposition: A | Discharge status: Home / Self Care | Payer: Self-pay | Source: Ambulatory Visit | Attending: Thoracic Surgery (Cardiothoracic Vascular Surgery) | Admitting: Thoracic Surgery (Cardiothoracic Vascular Surgery)

## 2021-02-20 ENCOUNTER — Telehealth: Payer: Self-pay | Admitting: Licensed Clinical Social Worker

## 2021-02-20 DIAGNOSIS — E119 Type 2 diabetes mellitus without complications: Secondary | ICD-10-CM | POA: Insufficient documentation

## 2021-02-20 DIAGNOSIS — I25118 Atherosclerotic heart disease of native coronary artery with other forms of angina pectoris: Secondary | ICD-10-CM

## 2021-02-20 DIAGNOSIS — I25119 Atherosclerotic heart disease of native coronary artery with unspecified angina pectoris: Secondary | ICD-10-CM | POA: Insufficient documentation

## 2021-02-20 DIAGNOSIS — I255 Ischemic cardiomyopathy: Secondary | ICD-10-CM

## 2021-02-20 DIAGNOSIS — I351 Nonrheumatic aortic (valve) insufficiency: Secondary | ICD-10-CM | POA: Insufficient documentation

## 2021-02-20 DIAGNOSIS — E785 Hyperlipidemia, unspecified: Secondary | ICD-10-CM | POA: Insufficient documentation

## 2021-02-20 DIAGNOSIS — E118 Type 2 diabetes mellitus with unspecified complications: Secondary | ICD-10-CM

## 2021-02-20 DIAGNOSIS — I429 Cardiomyopathy, unspecified: Secondary | ICD-10-CM | POA: Insufficient documentation

## 2021-02-20 LAB — ECHOCARDIOGRAM COMPLETE
Area-P 1/2: 5.54 cm2
Calc EF: 35.1 %
S' Lateral: 4.4 cm
Single Plane A2C EF: 32.4 %
Single Plane A4C EF: 32.8 %

## 2021-02-20 MED ORDER — AMLODIPINE BESYLATE 5 MG PO TABS: 5.0000 mg | ORAL_TABLET | Freq: Every day | ORAL | 3 refills | Status: DC

## 2021-02-20 MED ORDER — ATORVASTATIN CALCIUM 80 MG PO TABS: 80.0000 mg | ORAL_TABLET | Freq: Every day | ORAL | 3 refills | Status: DC

## 2021-02-20 MED ORDER — FUROSEMIDE 40 MG PO TABS: 40.0000 mg | ORAL_TABLET | Freq: Every day | ORAL | 3 refills | Status: DC

## 2021-02-20 MED ORDER — ASPIRIN EC 81 MG PO TBEC: 81.0000 mg | DELAYED_RELEASE_TABLET | Freq: Every day | ORAL | 3 refills | Status: DC

## 2021-02-20 NOTE — Progress Notes (Signed)
Pre-CABG completed. Refer to "CV Proc" under chart review to view preliminary results.  02/20/2021 2:30 PM Eula Fried., MHA, RVT, RDCS, RDMS

## 2021-02-20 NOTE — Progress Notes (Signed)
Surgical Instructions    Your procedure is scheduled on Tuesday August 2.  Report to [facility] Main Entrance "A" at 5:30 A.M., then check in with the Admitting office.  Call this number if you have problems the morning of surgery:  [phone]   If you have any questions prior to your surgery date call [phone]: Open Monday-Friday 8am-4pm    Remember:  Do not eat or drink anything after midnight the night before your surgery   Take these medicines the morning of surgery with A SIP OF WATER :              atorvastatin (LIPITOR)               amLODipine (NORVASC)               metoprolol succinate (TOPROL-XL)   WHAT DO I DO ABOUT MY DIABETES MEDICATION?   DO NOT  take oral diabetes medicines (pills) the morning of surgery : glyBURIDE (DIABETA), metFORMIN (GLUCOPHAGE)  DO NOT take the evening dose of glyBURIDE (DIABETA)  the day before surgery.   HOW TO MANAGE YOUR DIABETES BEFORE AND AFTER SURGERY  Why is it important to control my blood sugar before and after surgery? Improving blood sugar levels before and after surgery helps healing and can limit problems. A way of improving blood sugar control is eating a healthy diet by:  Eating less sugar and carbohydrates  Increasing activity/exercise  Talking with your doctor about reaching your blood sugar goals High blood sugars (greater than 180 mg/dL) can raise your risk of infections and slow your recovery, so you will need to focus on controlling your diabetes during the weeks before surgery. Make sure that the doctor who takes care of your diabetes knows about your planned surgery including the date and location.  How do I manage my blood sugar before surgery? Check your blood sugar at least 4 times a day, starting 2 days before surgery, to make sure that the level is not too high or low.  Check your blood sugar the morning of your surgery when you wake up and every 2 hours until you get to the Short Stay unit.  If your  blood sugar is less than 70 mg/dL, you will need to treat for low blood sugar: Do not take insulin. Treat a low blood sugar (less than 70 mg/dL) with  cup of clear juice (cranberry or apple), 4 glucose tablets, OR glucose gel. Recheck blood sugar in 15 minutes after treatment (to make sure it is greater than 70 mg/dL). If your blood sugar is not greater than 70 mg/dL on recheck, call [phone] for further instructions. Report your blood sugar to the short stay nurse when you get to Short Stay.  If you are admitted to the hospital after surgery: Your blood sugar will be checked by the staff and you will probably be given insulin after surgery (instead of oral diabetes medicines) to make sure you have good blood sugar levels. The goal for blood sugar control after surgery is 80-180 mg/dL.    As of today, STOP taking any Aspirin (unless otherwise instructed by your surgeon) Aleve, Naproxen, Ibuprofen, Motrin, Advil, Goody's, BC's, all herbal medications, fish oil, and all vitamins.          Do not wear jewelry or makeup Do not wear lotions, powders, perfumes/colognes, or deodorant. Do not shave 48 hours prior to surgery.  Men may shave face and neck. Do not bring valuables to the  hospital. DO Not wear nail polish, gel polish, artificial nails, or any other type of covering on  natural nails including finger and toenails. If patients have artificial nails, gel coating, etc. that need to be removed by a nail salon please have this removed prior to surgery or surgery may need to be canceled/delayed if the surgeon/ anesthesia feels like the patient is unable to be adequately monitored.             [facility] is not responsible for any belongings or valuables.  Do NOT Smoke (Tobacco/Vaping) or drink Alcohol 24 hours prior to your procedure If you use a CPAP at night, you may bring all equipment for your overnight stay.   Contacts, glasses, dentures or bridgework may not be worn into surgery,  please bring cases for these belongings   For patients admitted to the hospital, discharge time will be determined by your treatment team.   Patients discharged the day of surgery will not be allowed to drive home, and someone needs to stay with them for 24 hours.  ONLY 1 SUPPORT PERSON MAY BE PRESENT WHILE YOU ARE IN SURGERY. IF YOU ARE TO BE ADMITTED ONCE YOU ARE IN YOUR ROOM YOU WILL BE ALLOWED TWO (2) VISITORS.  Minor children may have two parents present. Special consideration for safety and communication needs will be reviewed on a case by case basis.  Special instructions:    Oral Hygiene is also important to reduce your risk of infection.  Remember - BRUSH YOUR TEETH THE MORNING OF SURGERY WITH YOUR REGULAR TOOTHPASTE   [facility]- Preparing For Surgery  Before surgery, you can play an important role. Because skin is not sterile, your skin needs to be as free of germs as possible. You can reduce the number of germs on your skin by washing with CHG (chlorahexidine gluconate) Soap before surgery.  CHG is an antiseptic cleaner which kills germs and bonds with the skin to continue killing germs even after washing.     Please do not use if you have an allergy to CHG or antibacterial soaps. If your skin becomes reddened/irritated stop using the CHG.  Do not shave (including legs and underarms) for at least 48 hours prior to first CHG shower. It is OK to shave your face.  Please follow these instructions carefully.     Shower the NIGHT BEFORE SURGERY and the MORNING OF SURGERY with CHG Soap.   If you chose to wash your hair, wash your hair first as usual with your normal shampoo. After you shampoo, rinse your hair and body thoroughly to remove the shampoo.  Then [employer] and genitals (private parts) with your normal soap and rinse thoroughly to remove soap.  After that Use CHG Soap as you would any other liquid soap. You can apply CHG directly to the skin and wash gently with a scrungie  or a clean washcloth.   Apply the CHG Soap to your body ONLY FROM THE NECK DOWN.  Do not use on open wounds or open sores. Avoid contact with your eyes, ears, mouth and genitals (private parts). Wash Face and genitals (private parts)  with your normal soap.   Wash thoroughly, paying special attention to the area where your surgery will be performed.  Thoroughly rinse your body with warm water from the neck down.  DO NOT shower/wash with your normal soap after using and rinsing off the CHG Soap.  Pat yourself dry with a CLEAN TOWEL.  Wear CLEAN  PAJAMAS to bed the night before surgery  Place CLEAN SHEETS on your bed the night before your surgery  DO NOT SLEEP WITH PETS.   Day of Surgery:  Take a shower with CHG soap. Wear Clean/Comfortable clothing the morning of surgery Do not apply any deodorants/lotions.   Remember to brush your teeth WITH YOUR REGULAR TOOTHPASTE.   Please read over the following fact sheets that you were given.

## 2021-02-20 NOTE — Telephone Encounter (Signed)
Received confirmation that pt eligible for [facility] program and has been approved through 01/24/2022. I have requested the following medications be sent to [facility] pharmacy: amlodipine, atorvastatin, aspirin and furosemide. I have sent pt a list of where his medications are currently being filled between [facility] and [facility]. I reminded pt that he could have his medications filled anytime needed at [facility] and reminded him that he could request assistance w/ [facility] Card application and [facility]. He is able to take [facility] Transportation to pick up those medications for free as well.   Remain available, reminder previously sent regarding missing documents for [facility] Financial Aid application). I will f/u in the next 1-2 weeks.   Octavio Graves, MSW, LCSW [facility] Heart/Vascular Care Navigation  [phone]

## 2021-02-21 ENCOUNTER — Encounter (HOSPITAL_COMMUNITY)
Admission: RE | Admit: 2021-02-21 | Discharge: 2021-02-21 | Disposition: A | Discharge status: Home / Self Care | Payer: Medicaid Other | Source: Ambulatory Visit | Attending: Thoracic Surgery (Cardiothoracic Vascular Surgery) | Admitting: Thoracic Surgery (Cardiothoracic Vascular Surgery)

## 2021-02-21 ENCOUNTER — Ambulatory Visit (HOSPITAL_COMMUNITY)
Admission: RE | Admit: 2021-02-21 | Discharge: 2021-02-21 | Disposition: A | Discharge status: Home / Self Care | Payer: Medicaid Other | Source: Ambulatory Visit | Attending: Thoracic Surgery (Cardiothoracic Vascular Surgery) | Admitting: Thoracic Surgery (Cardiothoracic Vascular Surgery)

## 2021-02-21 ENCOUNTER — Encounter (HOSPITAL_COMMUNITY): Payer: Self-pay | Admitting: Vascular Surgery

## 2021-02-21 ENCOUNTER — Encounter (HOSPITAL_COMMUNITY): Payer: Self-pay

## 2021-02-21 ENCOUNTER — Encounter (HOSPITAL_COMMUNITY): Payer: Self-pay | Admitting: Anesthesiology

## 2021-02-21 DIAGNOSIS — Z20822 Contact with and (suspected) exposure to covid-19: Secondary | ICD-10-CM | POA: Insufficient documentation

## 2021-02-21 DIAGNOSIS — I25119 Atherosclerotic heart disease of native coronary artery with unspecified angina pectoris: Secondary | ICD-10-CM

## 2021-02-21 DIAGNOSIS — Z01818 Encounter for other preprocedural examination: Secondary | ICD-10-CM | POA: Insufficient documentation

## 2021-02-21 HISTORY — DX: Acute myocardial infarction, unspecified: I21.9

## 2021-02-21 LAB — CBC
HCT: 31 % — ABNORMAL LOW (ref 39.0–52.0)
Hemoglobin: 10.4 g/dL — ABNORMAL LOW (ref 13.0–17.0)
MCH: 29.4 pg (ref 26.0–34.0)
MCHC: 33.5 g/dL (ref 30.0–36.0)
MCV: 87.6 fL (ref 80.0–100.0)
Platelets: 382 10*3/uL (ref 150–400)
RBC: 3.54 MIL/uL — ABNORMAL LOW (ref 4.22–5.81)
RDW: 12.2 % (ref 11.5–15.5)
WBC: 25.2 10*3/uL — ABNORMAL HIGH (ref 4.0–10.5)
nRBC: 0 % (ref 0.0–0.2)

## 2021-02-21 LAB — URINALYSIS, ROUTINE W REFLEX MICROSCOPIC
Bilirubin Urine: NEGATIVE
Glucose, UA: 150 mg/dL — AB
Ketones, ur: NEGATIVE mg/dL
Leukocytes,Ua: NEGATIVE
Nitrite: NEGATIVE
Protein, ur: >=300 mg/dL — AB
Specific Gravity, Urine: 1.022 (ref 1.005–1.030)
pH: 5 (ref 5.0–8.0)

## 2021-02-21 LAB — BLOOD GAS, ARTERIAL
Acid-base deficit: 2.6 mmol/L — ABNORMAL HIGH (ref 0.0–2.0)
Bicarbonate: 21.1 mmol/L (ref 20.0–28.0)
FIO2: 21
O2 Saturation: 96.6 %
Patient temperature: 37
pCO2 arterial: 32.7 mmHg (ref 32.0–48.0)
pH, Arterial: 7.426 (ref 7.350–7.450)
pO2, Arterial: 84.9 mmHg (ref 83.0–108.0)

## 2021-02-21 LAB — PROTIME-INR
INR: 1.3 — ABNORMAL HIGH (ref 0.8–1.2)
Prothrombin Time: 16.1 seconds — ABNORMAL HIGH (ref 11.4–15.2)

## 2021-02-21 LAB — COMPREHENSIVE METABOLIC PANEL
ALT: 10 U/L (ref 0–44)
AST: 11 U/L — ABNORMAL LOW (ref 15–41)
Albumin: 2.1 g/dL — ABNORMAL LOW (ref 3.5–5.0)
Alkaline Phosphatase: 74 U/L (ref 38–126)
Anion gap: 7 (ref 5–15)
BUN: 18 mg/dL (ref 6–20)
CO2: 20 mmol/L — ABNORMAL LOW (ref 22–32)
Calcium: 8.1 mg/dL — ABNORMAL LOW (ref 8.9–10.3)
Chloride: 105 mmol/L (ref 98–111)
Creatinine, Ser: 1.72 mg/dL — ABNORMAL HIGH (ref 0.61–1.24)
GFR, Estimated: 48 mL/min — ABNORMAL LOW (ref >60)
Glucose, Bld: 259 mg/dL — ABNORMAL HIGH (ref 70–99)
Potassium: 4.2 mmol/L (ref 3.5–5.1)
Sodium: 132 mmol/L — ABNORMAL LOW (ref 135–145)
Total Bilirubin: 0.3 mg/dL (ref 0.3–1.2)
Total Protein: 6.4 g/dL — ABNORMAL LOW (ref 6.5–8.1)

## 2021-02-21 LAB — SURGICAL PCR SCREEN
MRSA, PCR: POSITIVE — AB
Staphylococcus aureus: POSITIVE — AB

## 2021-02-21 LAB — GLUCOSE, CAPILLARY: Glucose-Capillary: 247 mg/dL — ABNORMAL HIGH (ref 70–99)

## 2021-02-21 LAB — SARS CORONAVIRUS 2 (TAT 6-24 HRS): SARS Coronavirus 2: NEGATIVE

## 2021-02-21 LAB — HEMOGLOBIN A1C
Hgb A1c MFr Bld: 8.8 % — ABNORMAL HIGH (ref 4.8–5.6)
Mean Plasma Glucose: 205.86 mg/dL

## 2021-02-21 LAB — APTT: aPTT: 38 seconds — ABNORMAL HIGH (ref 24–36)

## 2021-02-21 IMAGING — CR DG CHEST 2V
2 series · 2 of 2 positions shown · non-contrast
Comparison: Chest radiograph [DATE].  CT [DATE]

CLINICAL DATA: Coronary artery disease involving native coronary
artery of native heart without angina. Preadmit for open heart
surgery. Dizziness.

EXAM:
CHEST - 2 VIEW

[w chest pa]
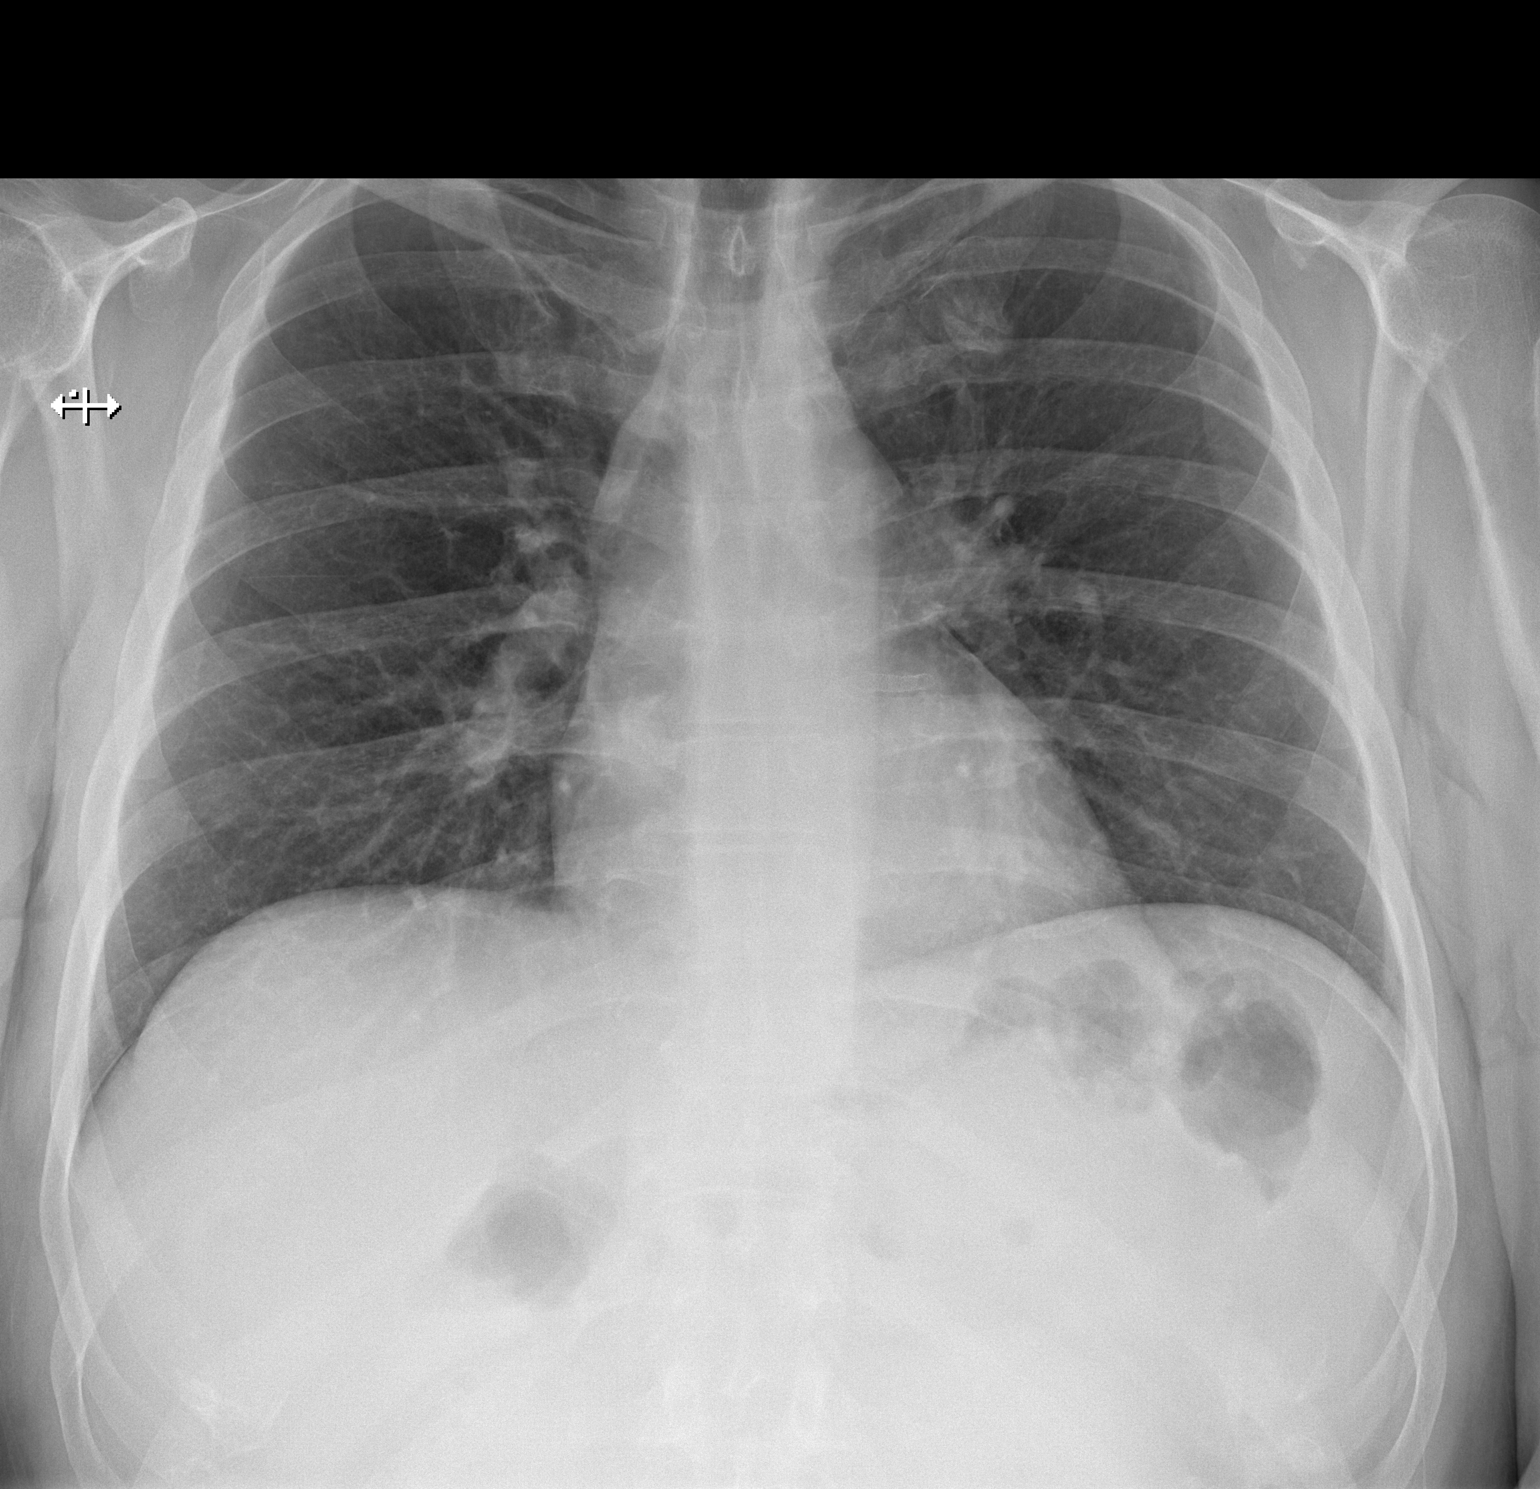

[w chest lat]
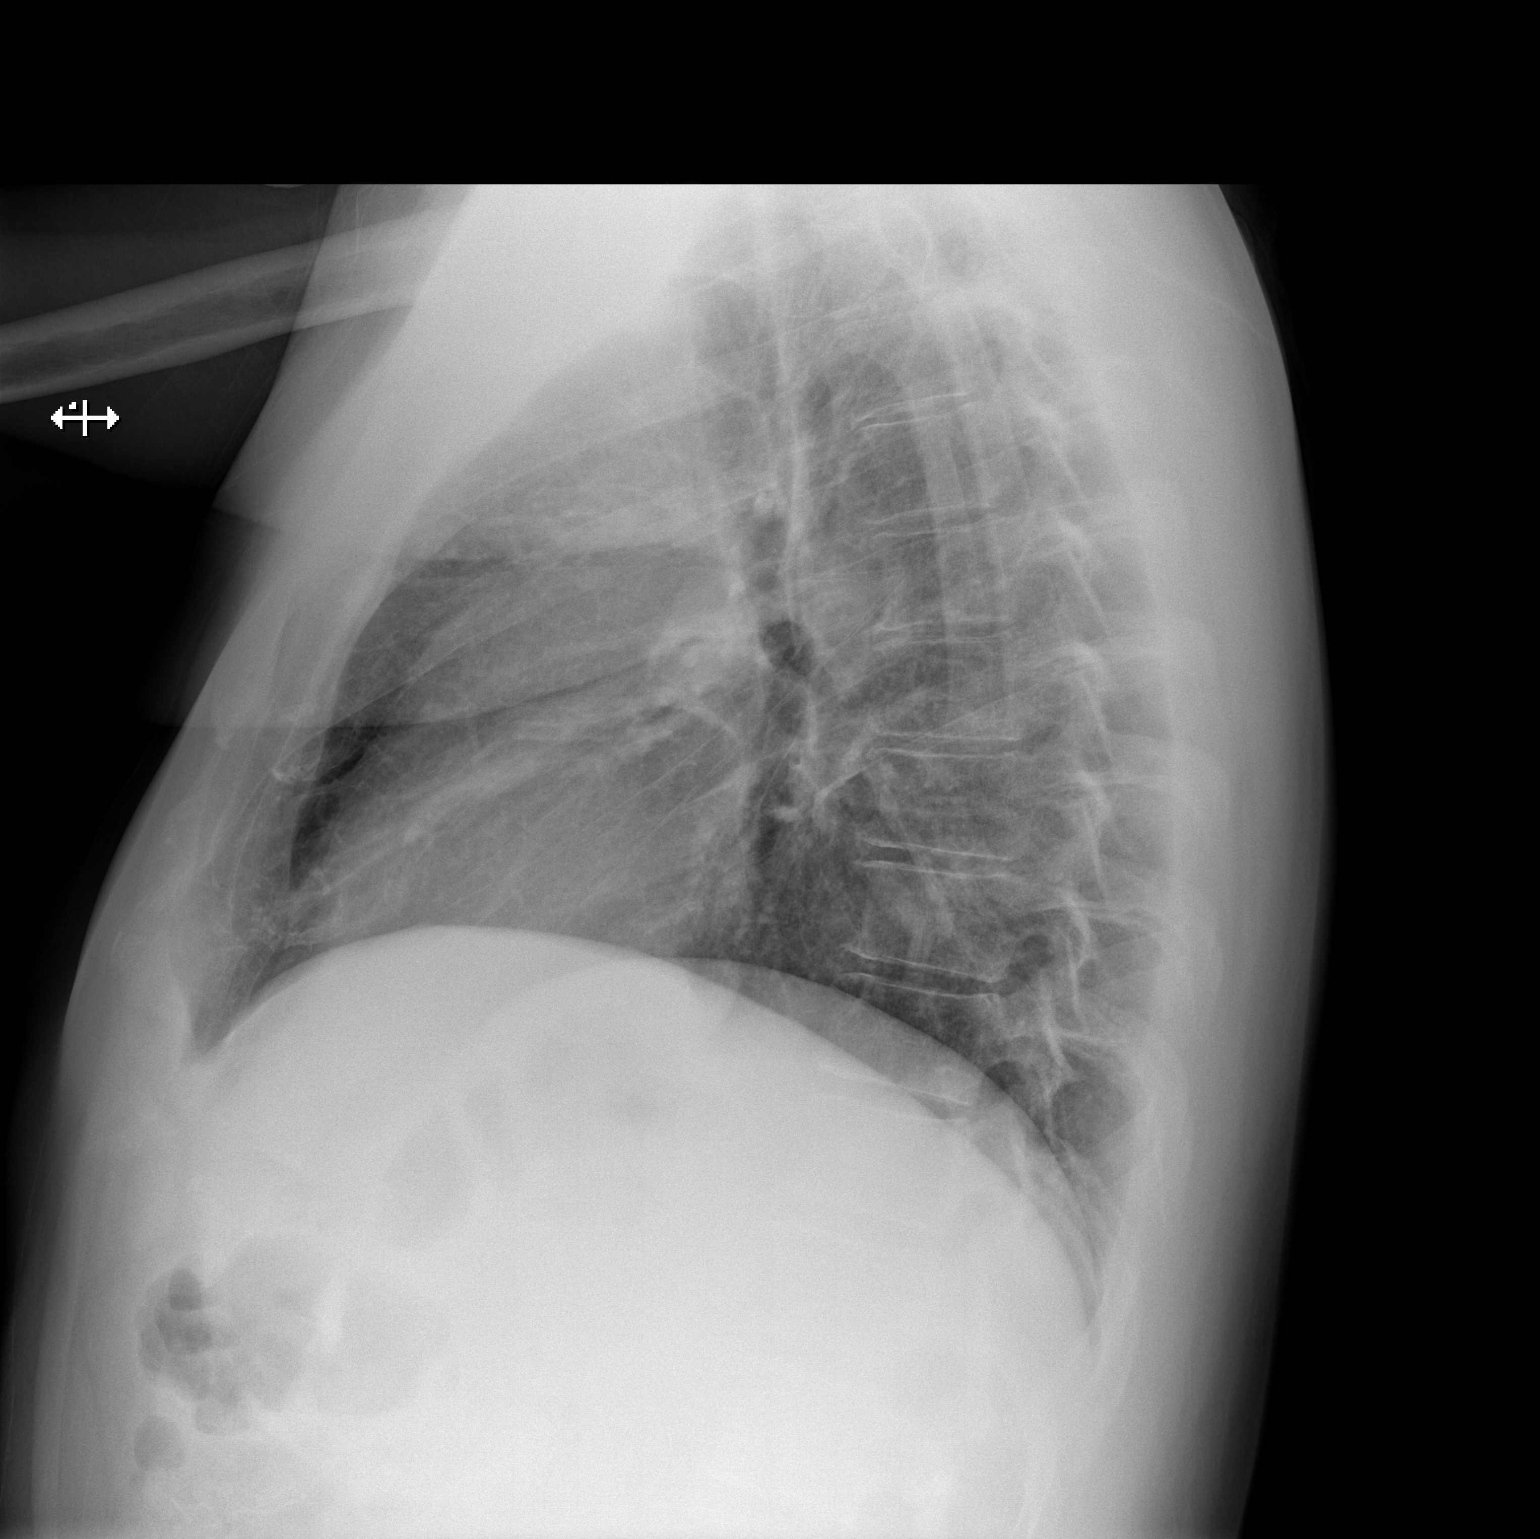

[2 of 2 positions shown; findings below may reference images not displayed]

FINDINGS: The heart is normal in size.The cardiomediastinal contours are
normal. The lungs are clear. Pulmonary vasculature is normal. No
consolidation, pleural effusion, or pneumothorax. No acute osseous
abnormalities are seen.
IMPRESSION: Unremarkable radiographs of the chest.

## 2021-02-21 NOTE — Progress Notes (Signed)
PCP - Shan Levans MD,[facility] Cardiologist - Norman Herrlich  PPM/ICD - denies Device Orders -  Rep Notified -   Chest x-ray - 02/21/21 EKG - 02/21/21 Stress Test - none ECHO - 02/20/21 Cardiac Cath - 09/13/20  Sleep Study - denies CPAP - no   Fasting Blood Sugar - pt states he doesn't have a meter so he does not check his sugar.  Checks Blood Sugar _____ times a day  Blood Thinner Instructions:n/a Aspirin Instructions:pt told to call Dr. Lucilla Lame office for instructions regarding Aspirin  ERAS Protcol -no PRE-SURGERY Ensure or G2-   COVID TEST- 02/21/21   Anesthesia review: yes-cardiac surgery  Patient denies shortness of breath, fever, cough and chest pain at PAT appointment   All instructions explained to the patient, with a verbal understanding of the material. Patient agrees to go over the instructions while at home for a better understanding. Patient also instructed to self quarantine after being tested for COVID-19. The opportunity to ask questions was provided.

## 2021-02-21 NOTE — Progress Notes (Signed)
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 She will forward lab values to Dr.Lightfoot. Also notified Shonna Chock of pt's abnormal lab values.

## 2021-02-24 ENCOUNTER — Encounter (HOSPITAL_COMMUNITY): Payer: Self-pay

## 2021-02-24 ENCOUNTER — Other Ambulatory Visit: Payer: Self-pay

## 2021-02-24 ENCOUNTER — Emergency Department (HOSPITAL_COMMUNITY): Payer: Self-pay | Admitting: Anesthesiology

## 2021-02-24 ENCOUNTER — Inpatient Hospital Stay (HOSPITAL_COMMUNITY)
Admission: EM | Admit: 2021-02-24 | Discharge: 2021-03-15 | DRG: 500 | Disposition: A | Discharge status: Home Health | Payer: Self-pay | Attending: Internal Medicine | Admitting: Internal Medicine

## 2021-02-24 ENCOUNTER — Encounter (HOSPITAL_COMMUNITY)
Admission: EM | Disposition: A | Discharge status: Home Health | Payer: Self-pay | Source: Home / Self Care | Attending: Student in an Organized Health Care Education/Training Program

## 2021-02-24 ENCOUNTER — Emergency Department (HOSPITAL_COMMUNITY): Payer: Self-pay

## 2021-02-24 ENCOUNTER — Other Ambulatory Visit (HOSPITAL_COMMUNITY): Payer: Self-pay

## 2021-02-24 DIAGNOSIS — I351 Nonrheumatic aortic (valve) insufficiency: Secondary | ICD-10-CM | POA: Diagnosis present

## 2021-02-24 DIAGNOSIS — B9562 Methicillin resistant Staphylococcus aureus infection as the cause of diseases classified elsewhere: Secondary | ICD-10-CM | POA: Diagnosis present

## 2021-02-24 DIAGNOSIS — D62 Acute posthemorrhagic anemia: Secondary | ICD-10-CM | POA: Diagnosis not present

## 2021-02-24 DIAGNOSIS — L97401 Non-pressure chronic ulcer of unspecified heel and midfoot limited to breakdown of skin: Secondary | ICD-10-CM

## 2021-02-24 DIAGNOSIS — Z79899 Other long term (current) drug therapy: Secondary | ICD-10-CM

## 2021-02-24 DIAGNOSIS — A480 Gas gangrene: Secondary | ICD-10-CM | POA: Diagnosis present

## 2021-02-24 DIAGNOSIS — I252 Old myocardial infarction: Secondary | ICD-10-CM

## 2021-02-24 DIAGNOSIS — N170 Acute kidney failure with tubular necrosis: Secondary | ICD-10-CM | POA: Diagnosis present

## 2021-02-24 DIAGNOSIS — Z7984 Long term (current) use of oral hypoglycemic drugs: Secondary | ICD-10-CM

## 2021-02-24 DIAGNOSIS — K2101 Gastro-esophageal reflux disease with esophagitis, with bleeding: Secondary | ICD-10-CM | POA: Diagnosis not present

## 2021-02-24 DIAGNOSIS — I5022 Chronic systolic (congestive) heart failure: Secondary | ICD-10-CM | POA: Diagnosis present

## 2021-02-24 DIAGNOSIS — F17210 Nicotine dependence, cigarettes, uncomplicated: Secondary | ICD-10-CM | POA: Diagnosis present

## 2021-02-24 DIAGNOSIS — Z89421 Acquired absence of other right toe(s): Secondary | ICD-10-CM

## 2021-02-24 DIAGNOSIS — E860 Dehydration: Secondary | ICD-10-CM | POA: Diagnosis present

## 2021-02-24 DIAGNOSIS — K2211 Ulcer of esophagus with bleeding: Secondary | ICD-10-CM | POA: Diagnosis not present

## 2021-02-24 DIAGNOSIS — E11628 Type 2 diabetes mellitus with other skin complications: Secondary | ICD-10-CM | POA: Diagnosis present

## 2021-02-24 DIAGNOSIS — E119 Type 2 diabetes mellitus without complications: Secondary | ICD-10-CM

## 2021-02-24 DIAGNOSIS — E1152 Type 2 diabetes mellitus with diabetic peripheral angiopathy with gangrene: Secondary | ICD-10-CM | POA: Diagnosis present

## 2021-02-24 DIAGNOSIS — I96 Gangrene, not elsewhere classified: Secondary | ICD-10-CM

## 2021-02-24 DIAGNOSIS — Z95810 Presence of automatic (implantable) cardiac defibrillator: Secondary | ICD-10-CM

## 2021-02-24 DIAGNOSIS — E785 Hyperlipidemia, unspecified: Secondary | ICD-10-CM | POA: Diagnosis present

## 2021-02-24 DIAGNOSIS — M60062 Infective myositis, left lower leg: Secondary | ICD-10-CM | POA: Diagnosis present

## 2021-02-24 DIAGNOSIS — M726 Necrotizing fasciitis: Principal | ICD-10-CM

## 2021-02-24 DIAGNOSIS — Z9114 Patient's other noncompliance with medication regimen: Secondary | ICD-10-CM

## 2021-02-24 DIAGNOSIS — Z5989 Other problems related to housing and economic circumstances: Secondary | ICD-10-CM

## 2021-02-24 DIAGNOSIS — B954 Other streptococcus as the cause of diseases classified elsewhere: Secondary | ICD-10-CM | POA: Diagnosis present

## 2021-02-24 DIAGNOSIS — L02612 Cutaneous abscess of left foot: Secondary | ICD-10-CM | POA: Diagnosis present

## 2021-02-24 DIAGNOSIS — K209 Esophagitis, unspecified without bleeding: Secondary | ICD-10-CM

## 2021-02-24 DIAGNOSIS — Z9889 Other specified postprocedural states: Secondary | ICD-10-CM

## 2021-02-24 DIAGNOSIS — K922 Gastrointestinal hemorrhage, unspecified: Secondary | ICD-10-CM

## 2021-02-24 DIAGNOSIS — E8809 Other disorders of plasma-protein metabolism, not elsewhere classified: Secondary | ICD-10-CM | POA: Diagnosis present

## 2021-02-24 DIAGNOSIS — R262 Difficulty in walking, not elsewhere classified: Secondary | ICD-10-CM | POA: Diagnosis present

## 2021-02-24 DIAGNOSIS — L97519 Non-pressure chronic ulcer of other part of right foot with unspecified severity: Secondary | ICD-10-CM | POA: Diagnosis present

## 2021-02-24 DIAGNOSIS — I2510 Atherosclerotic heart disease of native coronary artery without angina pectoris: Secondary | ICD-10-CM | POA: Diagnosis present

## 2021-02-24 DIAGNOSIS — N1831 Chronic kidney disease, stage 3a: Secondary | ICD-10-CM | POA: Diagnosis present

## 2021-02-24 DIAGNOSIS — I77810 Thoracic aortic ectasia: Secondary | ICD-10-CM | POA: Diagnosis present

## 2021-02-24 DIAGNOSIS — Z7982 Long term (current) use of aspirin: Secondary | ICD-10-CM

## 2021-02-24 DIAGNOSIS — Z885 Allergy status to narcotic agent status: Secondary | ICD-10-CM

## 2021-02-24 DIAGNOSIS — T501X6A Underdosing of loop [high-ceiling] diuretics, initial encounter: Secondary | ICD-10-CM | POA: Diagnosis present

## 2021-02-24 DIAGNOSIS — E43 Unspecified severe protein-calorie malnutrition: Secondary | ICD-10-CM

## 2021-02-24 DIAGNOSIS — E861 Hypovolemia: Secondary | ICD-10-CM | POA: Diagnosis present

## 2021-02-24 DIAGNOSIS — E1169 Type 2 diabetes mellitus with other specified complication: Secondary | ICD-10-CM | POA: Diagnosis present

## 2021-02-24 DIAGNOSIS — E1142 Type 2 diabetes mellitus with diabetic polyneuropathy: Secondary | ICD-10-CM | POA: Diagnosis present

## 2021-02-24 DIAGNOSIS — E1122 Type 2 diabetes mellitus with diabetic chronic kidney disease: Secondary | ICD-10-CM | POA: Diagnosis present

## 2021-02-24 DIAGNOSIS — L03116 Cellulitis of left lower limb: Secondary | ICD-10-CM

## 2021-02-24 DIAGNOSIS — M65162 Other infective (teno)synovitis, left knee: Secondary | ICD-10-CM | POA: Diagnosis present

## 2021-02-24 DIAGNOSIS — I255 Ischemic cardiomyopathy: Secondary | ICD-10-CM | POA: Diagnosis present

## 2021-02-24 DIAGNOSIS — E08621 Diabetes mellitus due to underlying condition with foot ulcer: Secondary | ICD-10-CM

## 2021-02-24 DIAGNOSIS — L97509 Non-pressure chronic ulcer of other part of unspecified foot with unspecified severity: Secondary | ICD-10-CM

## 2021-02-24 DIAGNOSIS — I429 Cardiomyopathy, unspecified: Secondary | ICD-10-CM

## 2021-02-24 DIAGNOSIS — Z6834 Body mass index (BMI) 34.0-34.9, adult: Secondary | ICD-10-CM

## 2021-02-24 DIAGNOSIS — M869 Osteomyelitis, unspecified: Secondary | ICD-10-CM | POA: Diagnosis present

## 2021-02-24 DIAGNOSIS — E11621 Type 2 diabetes mellitus with foot ulcer: Secondary | ICD-10-CM | POA: Diagnosis present

## 2021-02-24 DIAGNOSIS — L97929 Non-pressure chronic ulcer of unspecified part of left lower leg with unspecified severity: Secondary | ICD-10-CM | POA: Diagnosis present

## 2021-02-24 DIAGNOSIS — E669 Obesity, unspecified: Secondary | ICD-10-CM | POA: Diagnosis present

## 2021-02-24 DIAGNOSIS — R319 Hematuria, unspecified: Secondary | ICD-10-CM

## 2021-02-24 HISTORY — PX: I & D EXTREMITY: SHX5045

## 2021-02-24 LAB — TROPONIN I (HIGH SENSITIVITY)
Troponin I (High Sensitivity): 34 ng/L — ABNORMAL HIGH (ref <18)
Troponin I (High Sensitivity): 27 ng/L — ABNORMAL HIGH (ref <18)

## 2021-02-24 LAB — LIPASE, BLOOD: Lipase: 30 U/L (ref 11–51)

## 2021-02-24 LAB — URINALYSIS, MICROSCOPIC (REFLEX)

## 2021-02-24 LAB — CBC
HCT: 30.4 % — ABNORMAL LOW (ref 39.0–52.0)
Hemoglobin: 10.1 g/dL — ABNORMAL LOW (ref 13.0–17.0)
MCH: 29.5 pg (ref 26.0–34.0)
MCHC: 33.2 g/dL (ref 30.0–36.0)
MCV: 88.9 fL (ref 80.0–100.0)
Platelets: 396 10*3/uL (ref 150–400)
RBC: 3.42 MIL/uL — ABNORMAL LOW (ref 4.22–5.81)
RDW: 12.4 % (ref 11.5–15.5)
WBC: 25.5 10*3/uL — ABNORMAL HIGH (ref 4.0–10.5)
nRBC: 0 % (ref 0.0–0.2)

## 2021-02-24 LAB — URINALYSIS, ROUTINE W REFLEX MICROSCOPIC
Bilirubin Urine: NEGATIVE
Glucose, UA: >=500 mg/dL — AB
Ketones, ur: NEGATIVE mg/dL
Leukocytes,Ua: NEGATIVE
Nitrite: NEGATIVE
Protein, ur: >300 mg/dL — AB
Specific Gravity, Urine: >1.03 — ABNORMAL HIGH (ref 1.005–1.030)
pH: 5.5 (ref 5.0–8.0)

## 2021-02-24 LAB — COMPREHENSIVE METABOLIC PANEL
ALT: 13 U/L (ref 0–44)
AST: 20 U/L (ref 15–41)
Albumin: 1.9 g/dL — ABNORMAL LOW (ref 3.5–5.0)
Alkaline Phosphatase: 97 U/L (ref 38–126)
Anion gap: 13 (ref 5–15)
BUN: 30 mg/dL — ABNORMAL HIGH (ref 6–20)
CO2: 21 mmol/L — ABNORMAL LOW (ref 22–32)
Calcium: 8.4 mg/dL — ABNORMAL LOW (ref 8.9–10.3)
Chloride: 96 mmol/L — ABNORMAL LOW (ref 98–111)
Creatinine, Ser: 2.4 mg/dL — ABNORMAL HIGH (ref 0.61–1.24)
GFR, Estimated: 32 mL/min — ABNORMAL LOW (ref >60)
Glucose, Bld: 264 mg/dL — ABNORMAL HIGH (ref 70–99)
Potassium: 4.1 mmol/L (ref 3.5–5.1)
Sodium: 130 mmol/L — ABNORMAL LOW (ref 135–145)
Total Bilirubin: 0.7 mg/dL (ref 0.3–1.2)
Total Protein: 6.9 g/dL (ref 6.5–8.1)

## 2021-02-24 LAB — LACTIC ACID, PLASMA
Lactic Acid, Venous: 2.5 mmol/L (ref 0.5–1.9)
Lactic Acid, Venous: 1 mmol/L (ref 0.5–1.9)

## 2021-02-24 IMAGING — CR DG TIBIA/FIBULA 2V*L*
3 series · 3 of 3 positions shown · non-contrast
Comparison: None.

CLINICAL DATA: Left leg cellulitis

EXAM:
LEFT ANKLE - 2 VIEW; LEFT TIBIA AND FIBULA - 2 VIEW; LEFT FOOT - 2
VIEW

[tibia ap (1 of 2)]
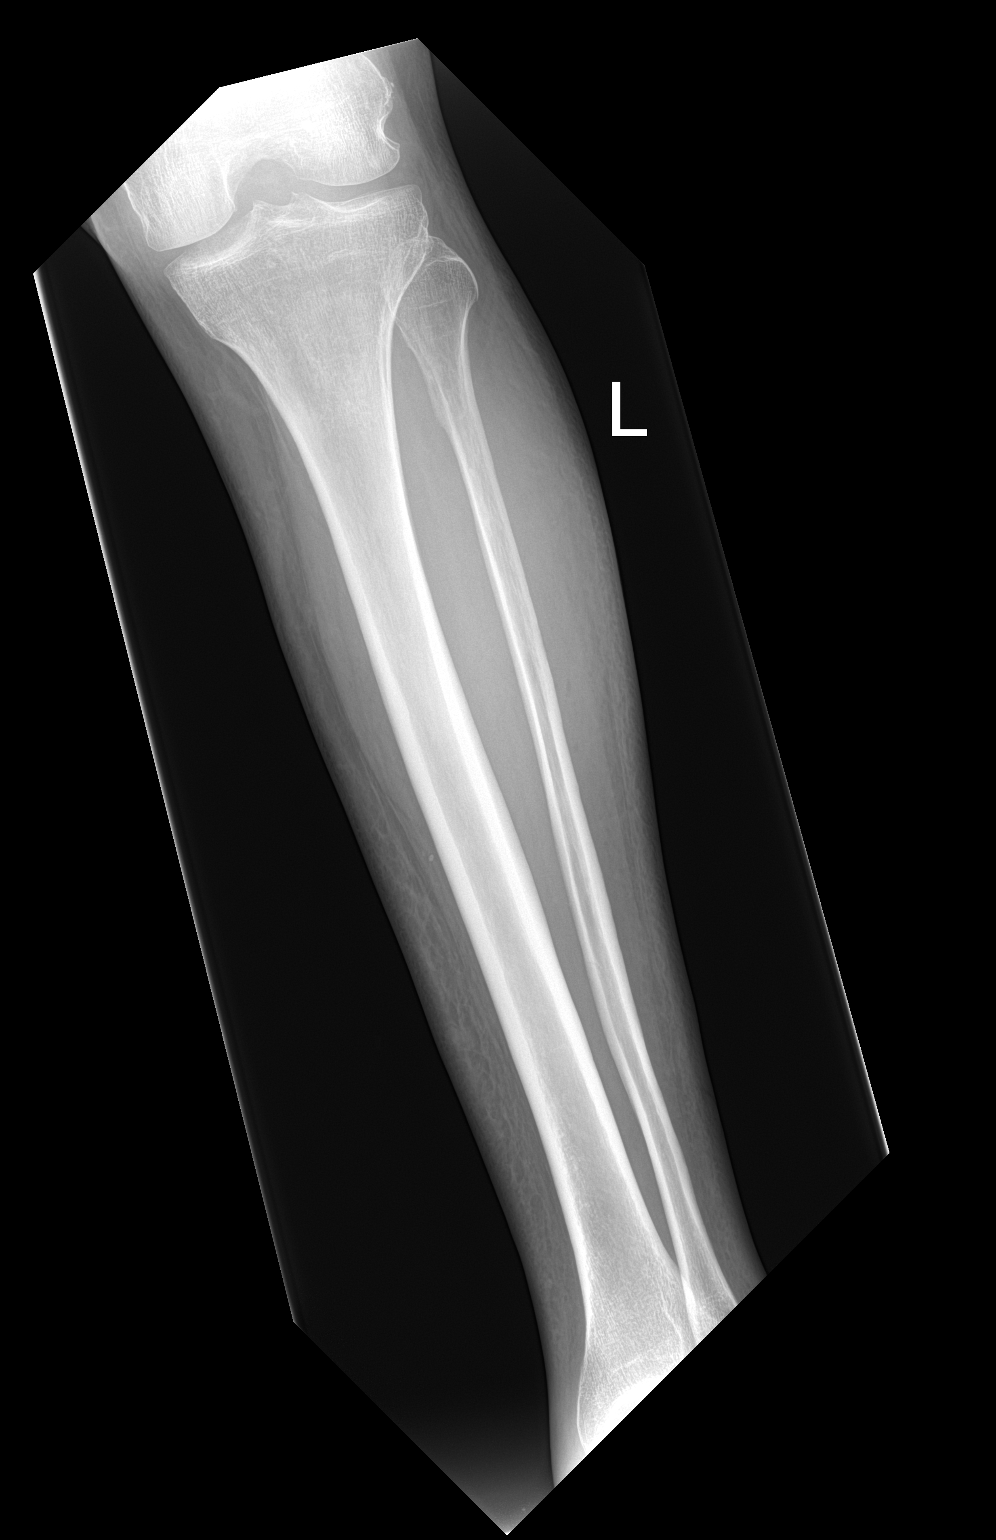

[tibia ap (2 of 2)]
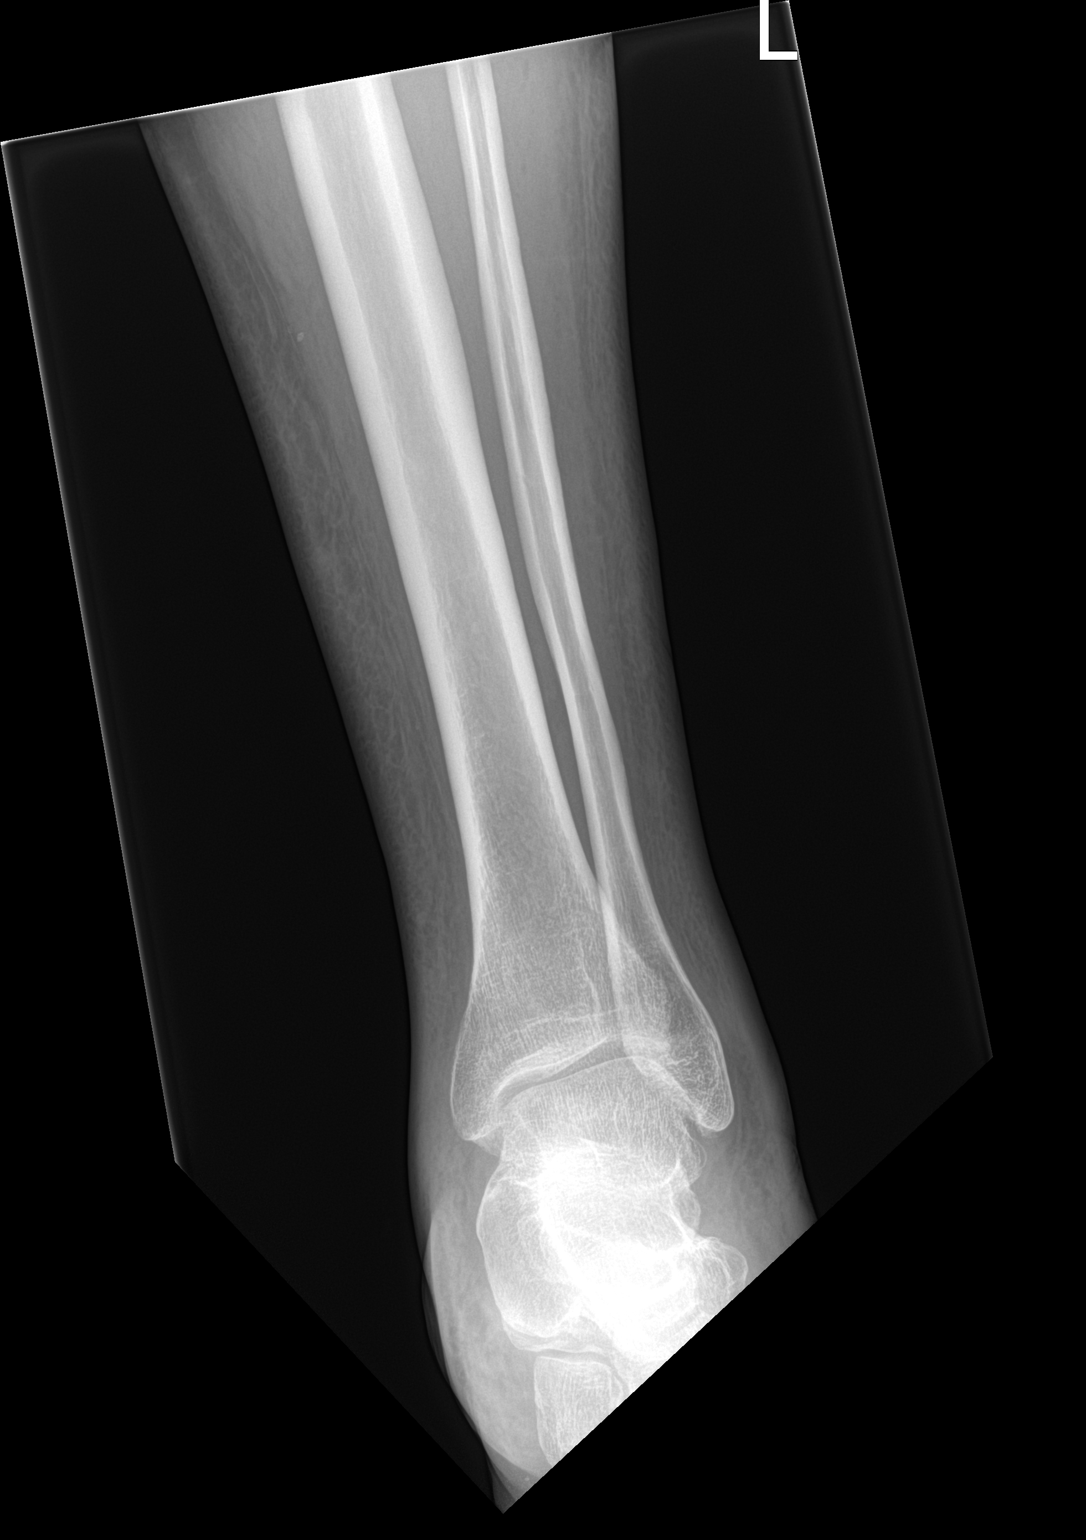

[tibia lat]
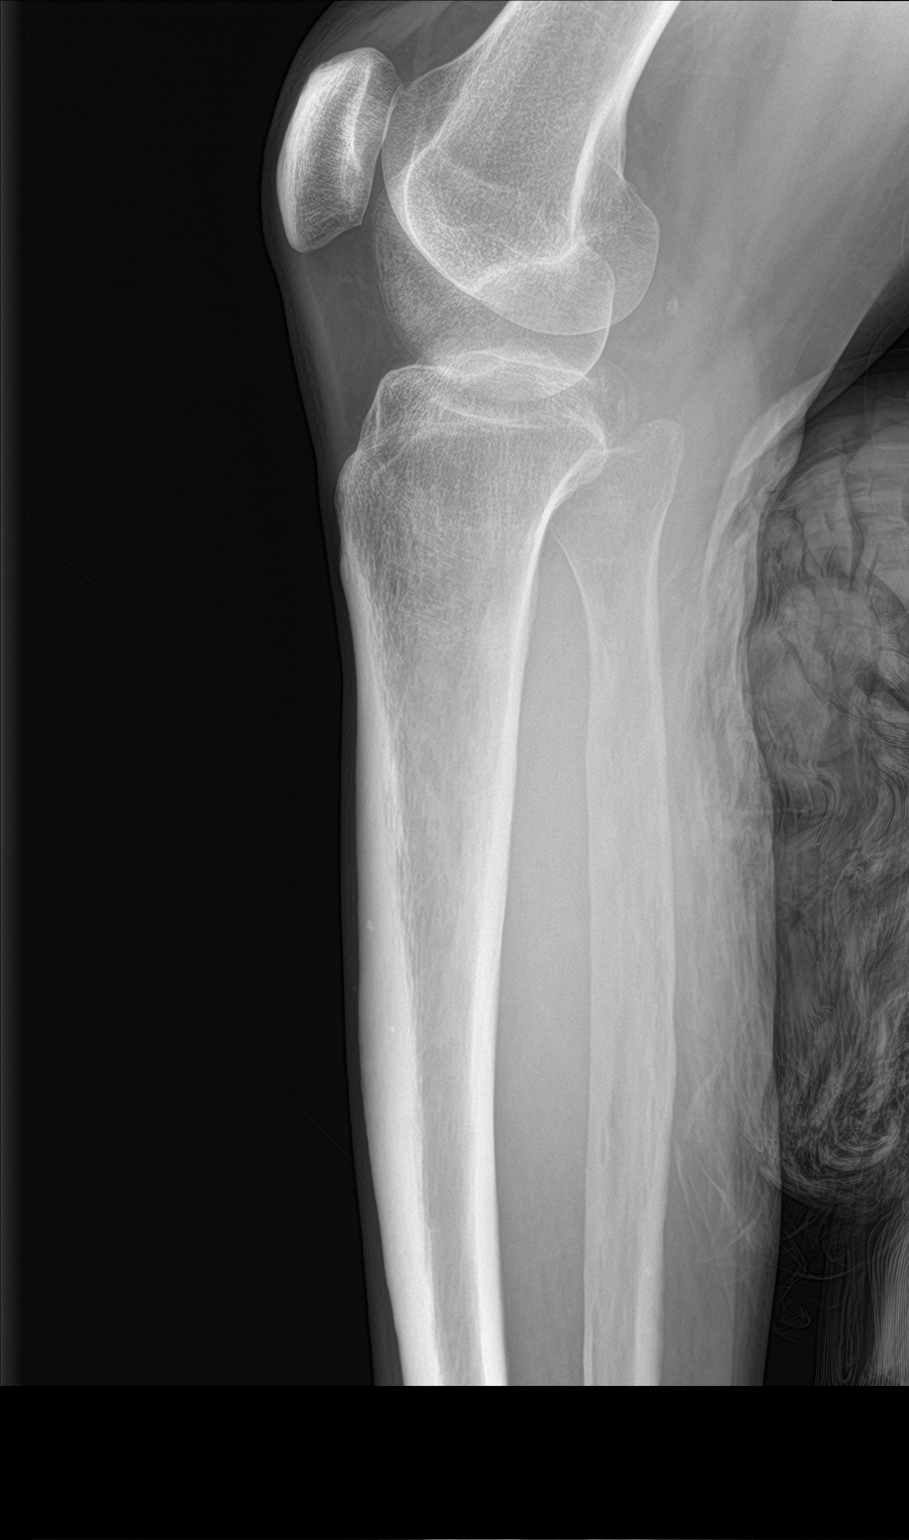

[3 of 3 positions shown; findings below may reference images not displayed]

FINDINGS: No fracture or dislocation of the left tibia or fibula, ankle, or
foot. Diffuse soft tissue edema about the lower leg, ankle, and
foot. There are subcutaneous soft tissue gas loculations about the
dorsum of the foot, predominantly overlying the distal fourth and
fifth metatarsals. No bony erosion or sclerosis to suggest
osteomyelitis.
IMPRESSION: 1. Diffuse soft tissue edema about the lower leg, ankle, and foot.
2. There are subcutaneous soft tissue gas loculations about the
dorsum of the foot, predominantly overlying the distal fourth and
fifth metatarsals. Findings are concerning for gas-forming
infection.
3. No bony erosion or sclerosis to suggest osteomyelitis. MRI is the
most sensitive test for the detection of bone marrow edema and
osteomyelitis if clinically suspected.

## 2021-02-24 IMAGING — CR DG FOOT 2V*L*
2 series · 2 of 2 positions shown · non-contrast
Comparison: None.

CLINICAL DATA: Left leg cellulitis

EXAM:
LEFT ANKLE - 2 VIEW; LEFT TIBIA AND FIBULA - 2 VIEW; LEFT FOOT - 2
VIEW

[foot lat]
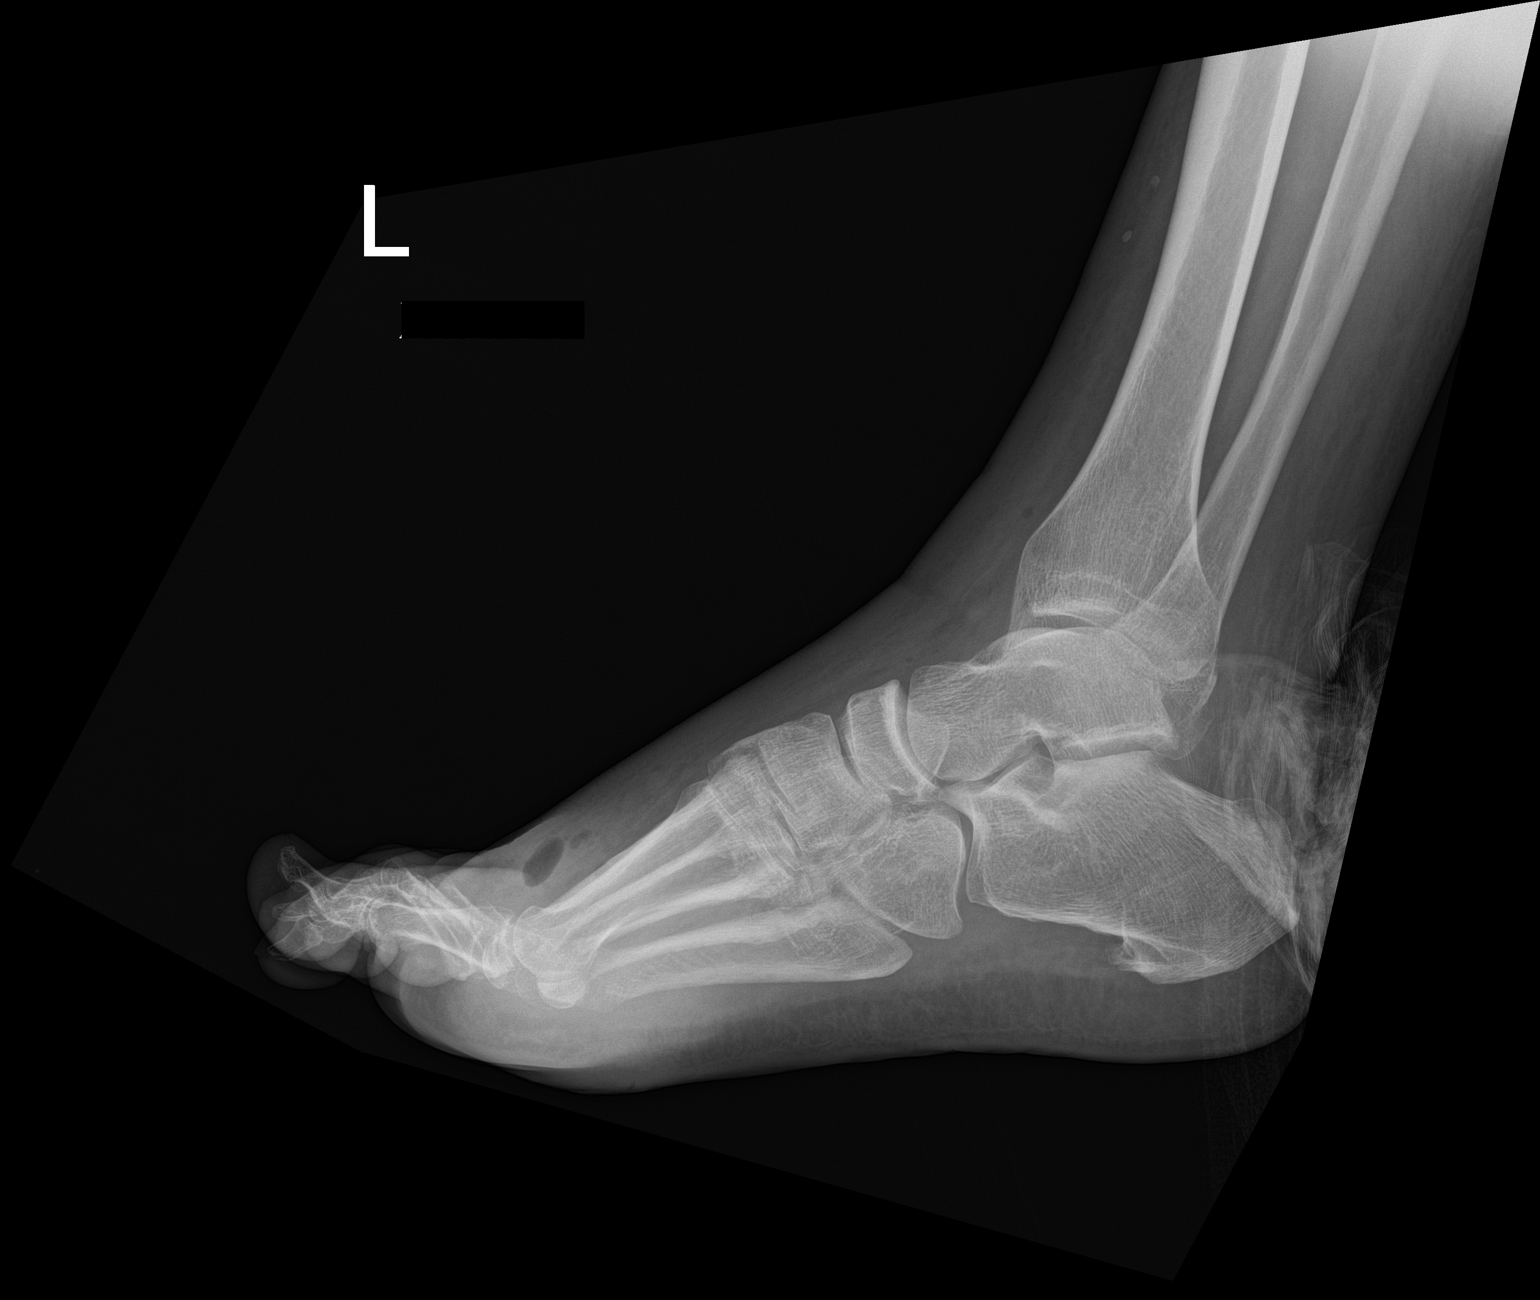

[foot ap]
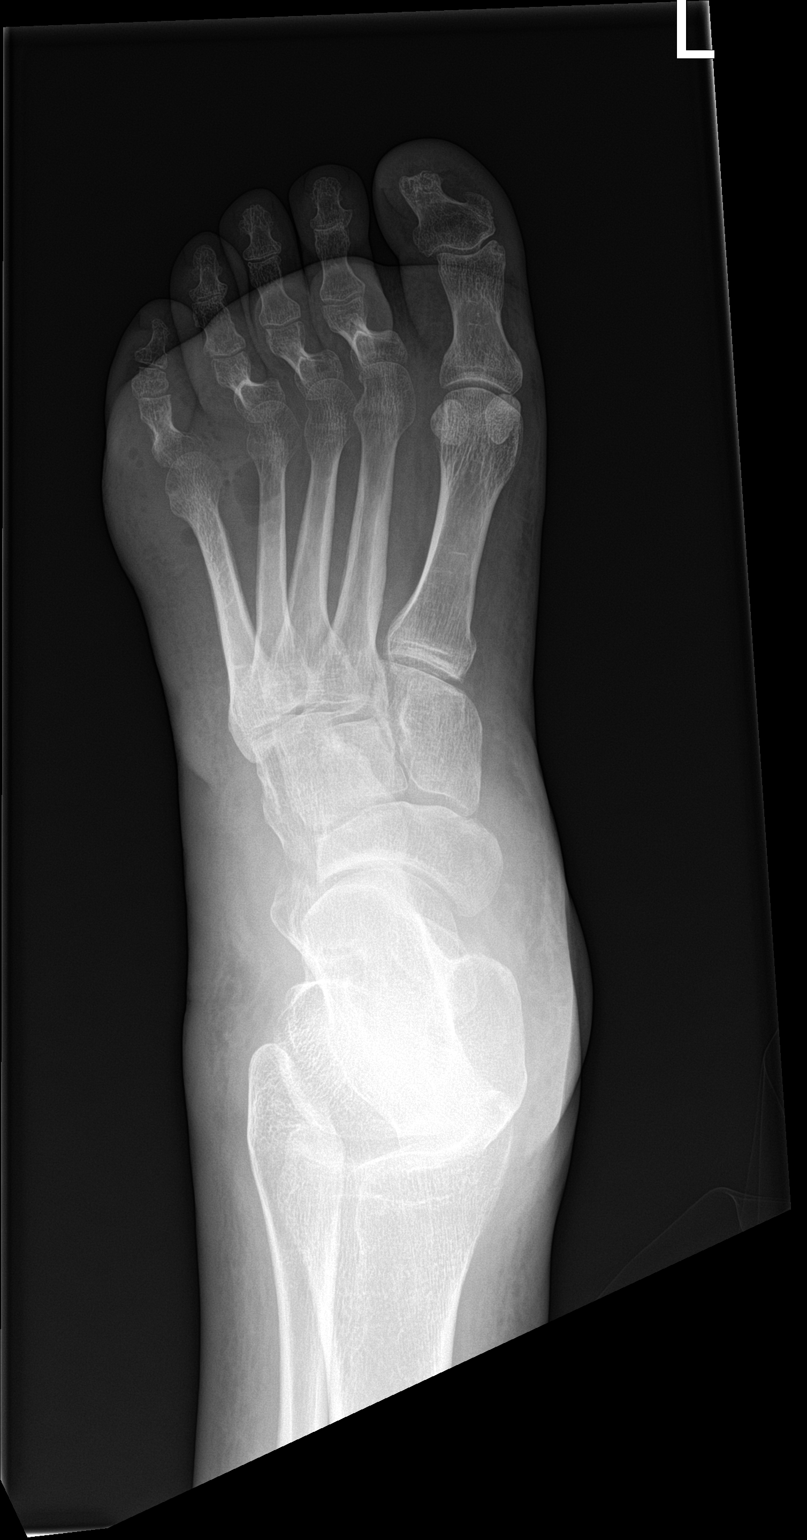

[2 of 2 positions shown; findings below may reference images not displayed]

FINDINGS: No fracture or dislocation of the left tibia or fibula, ankle, or
foot. Diffuse soft tissue edema about the lower leg, ankle, and
foot. There are subcutaneous soft tissue gas loculations about the
dorsum of the foot, predominantly overlying the distal fourth and
fifth metatarsals. No bony erosion or sclerosis to suggest
osteomyelitis.
IMPRESSION: 1. Diffuse soft tissue edema about the lower leg, ankle, and foot.
2. There are subcutaneous soft tissue gas loculations about the
dorsum of the foot, predominantly overlying the distal fourth and
fifth metatarsals. Findings are concerning for gas-forming
infection.
3. No bony erosion or sclerosis to suggest osteomyelitis. MRI is the
most sensitive test for the detection of bone marrow edema and
osteomyelitis if clinically suspected.

## 2021-02-24 IMAGING — CR DG ANKLE 2V *L*
2 series · 2 of 2 positions shown · non-contrast
Comparison: None.

CLINICAL DATA: Left leg cellulitis

EXAM:
LEFT ANKLE - 2 VIEW; LEFT TIBIA AND FIBULA - 2 VIEW; LEFT FOOT - 2
VIEW

[ankle ap]
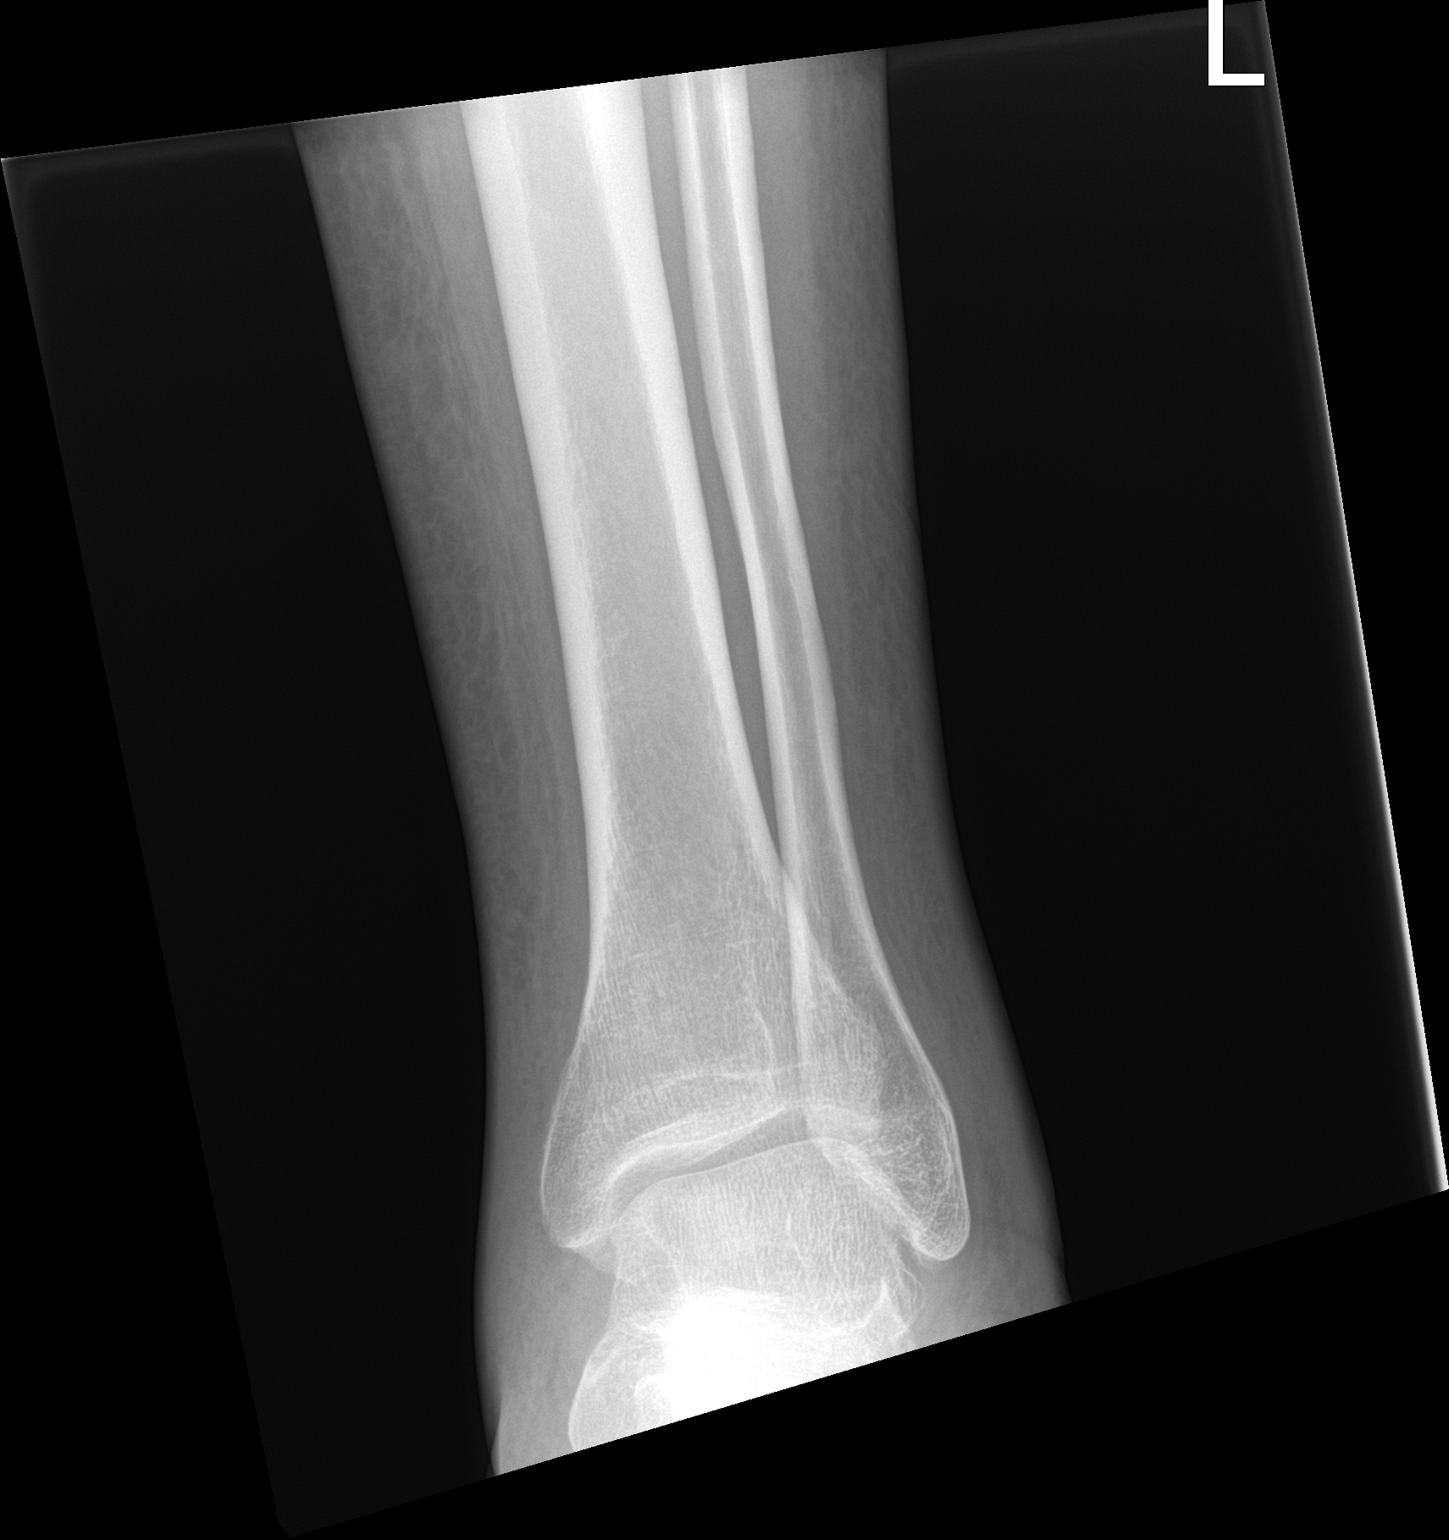

[ankle lat]
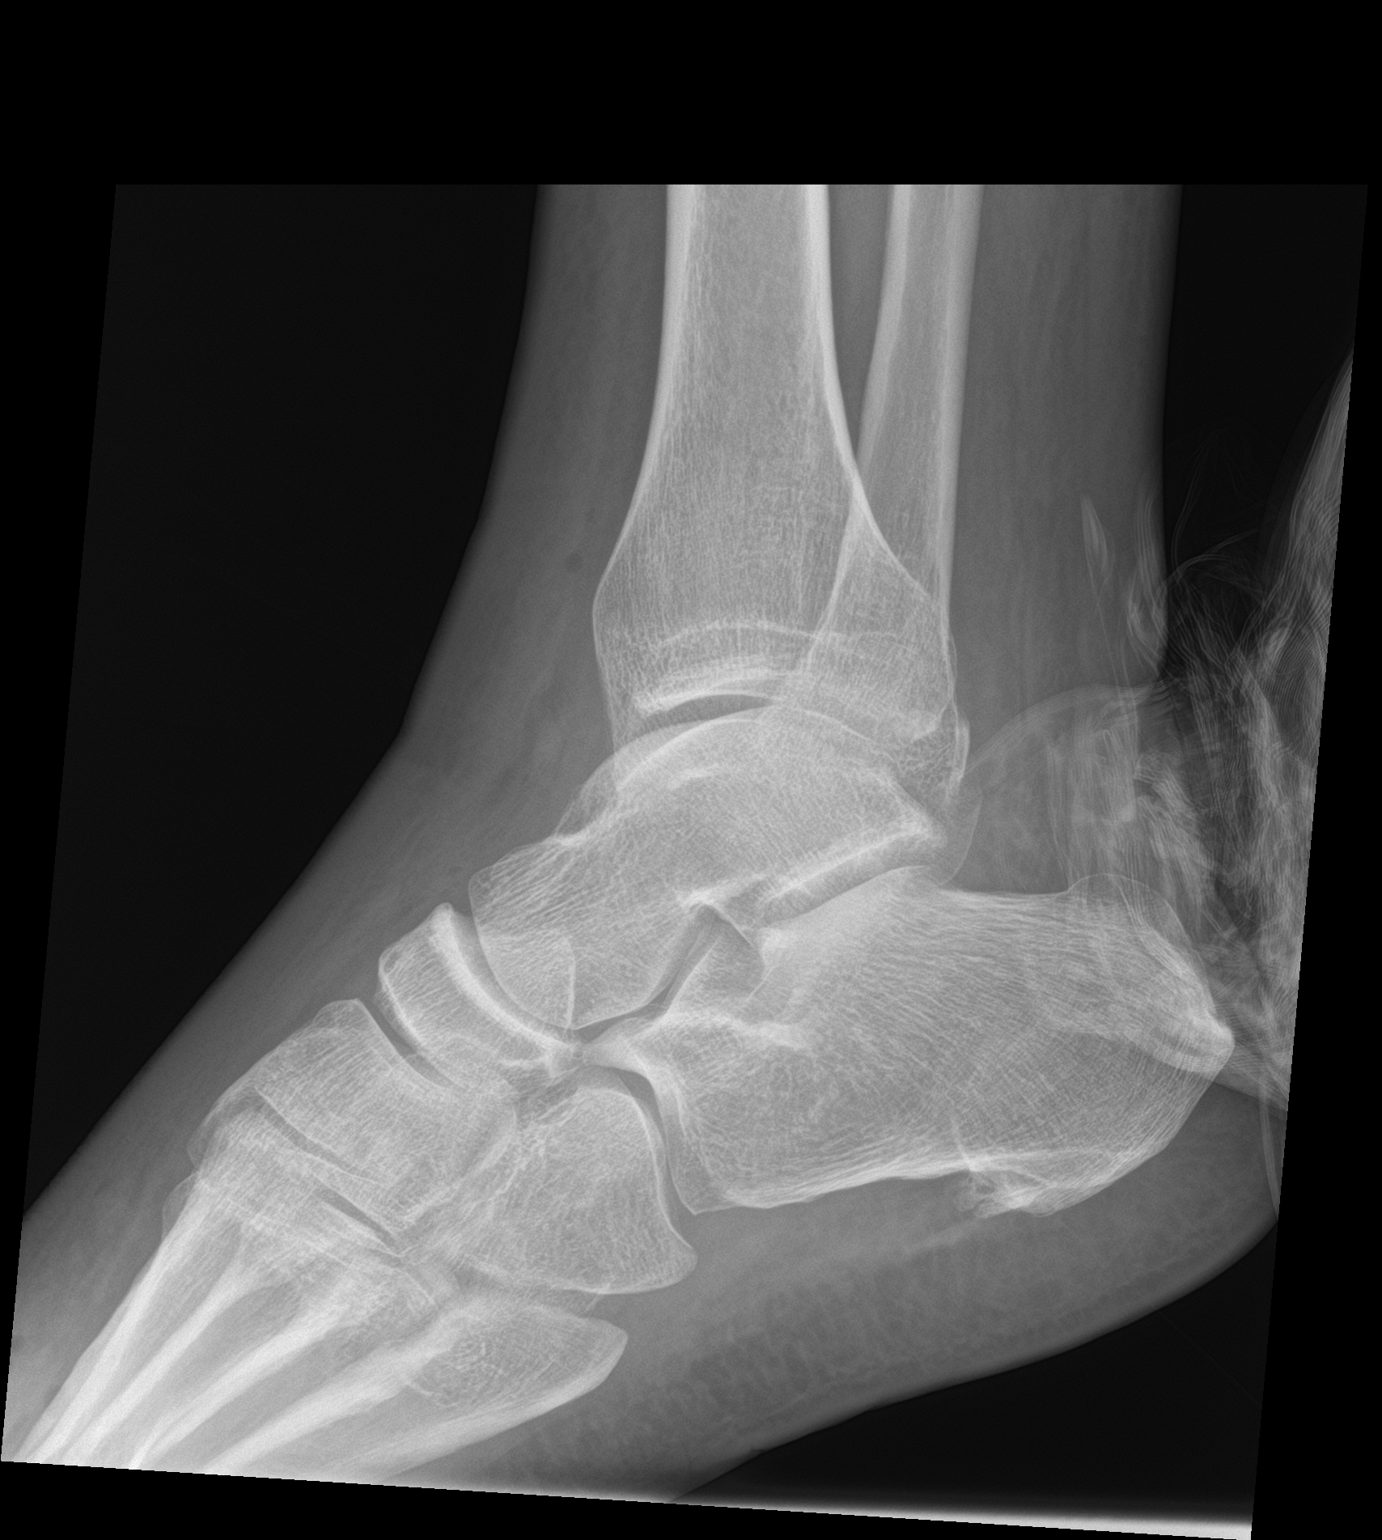

[2 of 2 positions shown; findings below may reference images not displayed]

FINDINGS: No fracture or dislocation of the left tibia or fibula, ankle, or
foot. Diffuse soft tissue edema about the lower leg, ankle, and
foot. There are subcutaneous soft tissue gas loculations about the
dorsum of the foot, predominantly overlying the distal fourth and
fifth metatarsals. No bony erosion or sclerosis to suggest
osteomyelitis.
IMPRESSION: 1. Diffuse soft tissue edema about the lower leg, ankle, and foot.
2. There are subcutaneous soft tissue gas loculations about the
dorsum of the foot, predominantly overlying the distal fourth and
fifth metatarsals. Findings are concerning for gas-forming
infection.
3. No bony erosion or sclerosis to suggest osteomyelitis. MRI is the
most sensitive test for the detection of bone marrow edema and
osteomyelitis if clinically suspected.

## 2021-02-24 SURGERY — IRRIGATION AND DEBRIDEMENT EXTREMITY
Anesthesia: General | Site: Ankle | Laterality: Left

## 2021-02-24 MED ORDER — ACETAMINOPHEN 10 MG/ML IV SOLN: 1000.0000 mg | Freq: Once | INTRAVENOUS | Status: DC | PRN

## 2021-02-24 MED ORDER — FENTANYL CITRATE (PF) 250 MCG/5ML IJ SOLN
INTRAMUSCULAR | Status: DC | PRN
  Administered 2021-02-24: 50 ug via INTRAVENOUS
  Administered 2021-02-24: 100 ug via INTRAVENOUS
  Administered 2021-02-25: 50 ug via INTRAVENOUS

## 2021-02-24 MED ORDER — NOREPINEPHRINE 4 MG/250ML-% IV SOLN
0.0000 ug/min | INTRAVENOUS | Status: DC
  Filled 2021-02-24: qty 250

## 2021-02-24 MED ORDER — TRANEXAMIC ACID (OHS) PUMP PRIME SOLUTION
2.0000 mg/kg | INTRAVENOUS | Status: DC
Start: 2021-02-25 — End: 2021-02-24
  Filled 2021-02-24: qty 1.9

## 2021-02-24 MED ORDER — PROPOFOL 10 MG/ML IV BOLUS
INTRAVENOUS | Status: DC | PRN
  Administered 2021-02-24: 70 mg via INTRAVENOUS
  Administered 2021-02-24: 130 mg via INTRAVENOUS

## 2021-02-24 MED ORDER — POTASSIUM CHLORIDE 2 MEQ/ML IV SOLN
80.0000 meq | INTRAVENOUS | Status: DC
  Filled 2021-02-24: qty 40

## 2021-02-24 MED ORDER — TRANEXAMIC ACID (OHS) BOLUS VIA INFUSION
15.0000 mg/kg | INTRAVENOUS | Status: DC
Start: 2021-02-25 — End: 2021-02-24
  Filled 2021-02-24: qty 1422

## 2021-02-24 MED ORDER — LIDOCAINE HCL (CARDIAC) PF 100 MG/5ML IV SOSY
PREFILLED_SYRINGE | INTRAVENOUS | Status: DC | PRN
  Administered 2021-02-24: 80 mg via INTRAVENOUS

## 2021-02-24 MED ORDER — PIPERACILLIN-TAZOBACTAM 3.375 G IVPB
3.3750 g | Freq: Once | INTRAVENOUS | Status: AC
  Administered 2021-02-24: 3.375 g via INTRAVENOUS
  Filled 2021-02-24: qty 50

## 2021-02-24 MED ORDER — MANNITOL 20 % IV SOLN
INTRAVENOUS | Status: DC
Start: 2021-02-25 — End: 2021-02-24
  Filled 2021-02-24: qty 13

## 2021-02-24 MED ORDER — PHENYLEPHRINE HCL (PRESSORS) 10 MG/ML IV SOLN
INTRAVENOUS | Status: DC | PRN
  Administered 2021-02-24: 80 ug via INTRAVENOUS

## 2021-02-24 MED ORDER — CHLORHEXIDINE GLUCONATE 4 % EX LIQD: 60.0000 mL | Freq: Once | CUTANEOUS | Status: DC

## 2021-02-24 MED ORDER — SODIUM CHLORIDE 0.9 % IR SOLN
Status: DC | PRN
  Administered 2021-02-24: 6000 mL

## 2021-02-24 MED ORDER — ROCURONIUM BROMIDE 100 MG/10ML IV SOLN
INTRAVENOUS | Status: DC | PRN
  Administered 2021-02-24: 50 mg via INTRAVENOUS

## 2021-02-24 MED ORDER — LACTATED RINGERS IV SOLN: INTRAVENOUS | Status: DC | PRN

## 2021-02-24 MED ORDER — NITROGLYCERIN IN D5W 200-5 MCG/ML-% IV SOLN
2.0000 ug/min | INTRAVENOUS | Status: DC
  Filled 2021-02-24: qty 250

## 2021-02-24 MED ORDER — PHENYLEPHRINE HCL-NACL 20-0.9 MG/250ML-% IV SOLN
30.0000 ug/min | INTRAVENOUS | Status: DC
Start: 2021-02-25 — End: 2021-02-24
  Filled 2021-02-24: qty 250

## 2021-02-24 MED ORDER — DEXMEDETOMIDINE HCL IN NACL 400 MCG/100ML IV SOLN
0.1000 ug/kg/h | INTRAVENOUS | Status: DC
  Filled 2021-02-24: qty 100

## 2021-02-24 MED ORDER — SUGAMMADEX SODIUM 200 MG/2ML IV SOLN
INTRAVENOUS | Status: DC | PRN
  Administered 2021-02-24: 250 mg via INTRAVENOUS

## 2021-02-24 MED ORDER — SODIUM CHLORIDE 0.9 % IV SOLN
INTRAVENOUS | Status: DC
Start: 2021-02-25 — End: 2021-02-24
  Filled 2021-02-24: qty 30

## 2021-02-24 MED ORDER — INSULIN REGULAR(HUMAN) IN NACL 100-0.9 UT/100ML-% IV SOLN
INTRAVENOUS | Status: DC
  Filled 2021-02-24: qty 100

## 2021-02-24 MED ORDER — PROMETHAZINE HCL 25 MG/ML IJ SOLN: 6.2500 mg | INTRAMUSCULAR | Status: DC | PRN

## 2021-02-24 MED ORDER — 0.9 % SODIUM CHLORIDE (POUR BTL) OPTIME
TOPICAL | Status: DC | PRN
  Administered 2021-02-24: 1000 mL

## 2021-02-24 MED ORDER — LACTATED RINGERS IV BOLUS
500.0000 mL | Freq: Once | INTRAVENOUS | Status: AC
  Administered 2021-02-24: 500 mL via INTRAVENOUS

## 2021-02-24 MED ORDER — PHENYLEPHRINE HCL-NACL 10-0.9 MG/250ML-% IV SOLN
INTRAVENOUS | Status: DC | PRN
  Administered 2021-02-24: 40 ug/min via INTRAVENOUS

## 2021-02-24 MED ORDER — MILRINONE LACTATE IN DEXTROSE 20-5 MG/100ML-% IV SOLN
0.3000 ug/kg/min | INTRAVENOUS | Status: DC
  Filled 2021-02-24: qty 100

## 2021-02-24 MED ORDER — INSULIN ASPART 100 UNIT/ML IJ SOLN
INTRAMUSCULAR | Status: DC | PRN
  Administered 2021-02-24: 10 [IU] via SUBCUTANEOUS

## 2021-02-24 MED ORDER — EPINEPHRINE HCL 5 MG/250ML IV SOLN IN NS
0.0000 ug/min | INTRAVENOUS | Status: DC
  Filled 2021-02-24: qty 250

## 2021-02-24 MED ORDER — CEFAZOLIN SODIUM-DEXTROSE 2-4 GM/100ML-% IV SOLN
2.0000 g | INTRAVENOUS | Status: DC
Start: 2021-02-25 — End: 2021-02-24
  Filled 2021-02-24: qty 100

## 2021-02-24 MED ORDER — VANCOMYCIN HCL 2000 MG/400ML IV SOLN
2000.0000 mg | Freq: Once | INTRAVENOUS | Status: AC
  Administered 2021-02-24: 2000 mg via INTRAVENOUS
  Filled 2021-02-24: qty 400

## 2021-02-24 MED ORDER — PAPAVERINE HCL 30 MG/ML IJ SOLN
INTRAMUSCULAR | Status: DC
Start: 2021-02-25 — End: 2021-02-24
  Filled 2021-02-24: qty 5

## 2021-02-24 MED ORDER — VANCOMYCIN HCL 1500 MG/300ML IV SOLN
1500.0000 mg | INTRAVENOUS | Status: DC
  Filled 2021-02-24: qty 300

## 2021-02-24 MED ORDER — MIDAZOLAM HCL 2 MG/2ML IJ SOLN
INTRAMUSCULAR | Status: DC | PRN
  Administered 2021-02-24: 2 mg via INTRAVENOUS

## 2021-02-24 MED ORDER — POVIDONE-IODINE 10 % EX SWAB
2.0000 | Freq: Once | CUTANEOUS | Status: DC
Start: 2021-02-24 — End: 2021-02-25

## 2021-02-24 MED ORDER — CEFAZOLIN SODIUM-DEXTROSE 2-4 GM/100ML-% IV SOLN
2.0000 g | INTRAVENOUS | Status: DC
  Filled 2021-02-24: qty 100

## 2021-02-24 MED ORDER — TRANEXAMIC ACID 1000 MG/10ML IV SOLN
1.5000 mg/kg/h | INTRAVENOUS | Status: DC
  Filled 2021-02-24: qty 25

## 2021-02-24 MED ORDER — ONDANSETRON HCL 4 MG/2ML IJ SOLN
INTRAMUSCULAR | Status: DC | PRN
  Administered 2021-02-24: 4 mg via INTRAVENOUS

## 2021-02-24 SURGICAL SUPPLY — 46 items
BAG COUNTER SPONGE SURGICOUNT (BAG) IMPLANT
BNDG COHESIVE 4X5 TAN STRL (GAUZE/BANDAGES/DRESSINGS) ×2 IMPLANT
BNDG ELASTIC 6X15 VLCR STRL LF (GAUZE/BANDAGES/DRESSINGS) ×2 IMPLANT
BNDG GAUZE ELAST 4 BULKY (GAUZE/BANDAGES/DRESSINGS) ×4 IMPLANT
BRUSH SCRUB EZ PLAIN DRY (MISCELLANEOUS) ×4 IMPLANT
COVER SURGICAL LIGHT HANDLE (MISCELLANEOUS) ×4 IMPLANT
DRAPE U-SHAPE 47X51 STRL (DRAPES) ×2 IMPLANT
DRSG ADAPTIC 3X8 NADH LF (GAUZE/BANDAGES/DRESSINGS) ×2 IMPLANT
DRSG MEPITEL 4X7.2 (GAUZE/BANDAGES/DRESSINGS) ×2 IMPLANT
ELECT REM PT RETURN 9FT ADLT (ELECTROSURGICAL) ×2
ELECTRODE REM PT RTRN 9FT ADLT (ELECTROSURGICAL) ×1 IMPLANT
GAUZE SPONGE 4X4 12PLY STRL (GAUZE/BANDAGES/DRESSINGS) ×2 IMPLANT
GLOVE SRG 8 PF TXTR STRL LF DI (GLOVE) ×1 IMPLANT
GLOVE SURG ENC MOIS LTX SZ7.5 (GLOVE) ×2 IMPLANT
GLOVE SURG ENC MOIS LTX SZ8 (GLOVE) ×2 IMPLANT
GLOVE SURG UNDER POLY LF SZ7.5 (GLOVE) ×2 IMPLANT
GLOVE SURG UNDER POLY LF SZ8 (GLOVE) ×1
GLOVE SURG UNDER POLY LF SZ9 (GLOVE) ×2 IMPLANT
GOWN STRL REUS W/ TWL LRG LVL3 (GOWN DISPOSABLE) ×2 IMPLANT
GOWN STRL REUS W/ TWL XL LVL3 (GOWN DISPOSABLE) ×1 IMPLANT
GOWN STRL REUS W/TWL LRG LVL3 (GOWN DISPOSABLE) ×2
GOWN STRL REUS W/TWL XL LVL3 (GOWN DISPOSABLE) ×1
HANDPIECE INTERPULSE COAX TIP (DISPOSABLE)
KIT BASIN OR (CUSTOM PROCEDURE TRAY) ×2 IMPLANT
KIT TURNOVER KIT B (KITS) ×2 IMPLANT
MANIFOLD NEPTUNE II (INSTRUMENTS) ×2 IMPLANT
NS IRRIG 1000ML POUR BTL (IV SOLUTION) ×2 IMPLANT
PACK ORTHO EXTREMITY (CUSTOM PROCEDURE TRAY) ×2 IMPLANT
PAD ABD 8X10 STRL (GAUZE/BANDAGES/DRESSINGS) ×2 IMPLANT
PAD ARMBOARD 7.5X6 YLW CONV (MISCELLANEOUS) ×4 IMPLANT
PADDING CAST COTTON 6X4 STRL (CAST SUPPLIES) ×2 IMPLANT
PADDING CAST SYN 6 (CAST SUPPLIES) ×1
PADDING CAST SYNTHETIC 6X4 NS (CAST SUPPLIES) ×1 IMPLANT
SET HNDPC FAN SPRY TIP SCT (DISPOSABLE) IMPLANT
SOL PREP POV-IOD 4OZ 10% (MISCELLANEOUS) ×2 IMPLANT
SOL PREP PROV IODINE SCRUB 4OZ (MISCELLANEOUS) ×2 IMPLANT
SPLINT PLASTER CAST XFAST 5X30 (CAST SUPPLIES) ×1 IMPLANT
SPLINT PLASTER XFAST SET 5X30 (CAST SUPPLIES) ×1
SPONGE T-LAP 18X18 ~~LOC~~+RFID (SPONGE) ×2 IMPLANT
STOCKINETTE IMPERVIOUS 9X36 MD (GAUZE/BANDAGES/DRESSINGS) IMPLANT
SUT PDS AB 2-0 CT1 27 (SUTURE) IMPLANT
TOWEL GREEN STERILE (TOWEL DISPOSABLE) ×4 IMPLANT
TOWEL GREEN STERILE FF (TOWEL DISPOSABLE) ×2 IMPLANT
TUBE CONNECTING 12X1/4 (SUCTIONS) ×2 IMPLANT
UNDERPAD 30X36 HEAVY ABSORB (UNDERPADS AND DIAPERS) ×2 IMPLANT
YANKAUER SUCT BULB TIP NO VENT (SUCTIONS) ×2 IMPLANT

## 2021-02-24 NOTE — Anesthesia Preprocedure Evaluation (Addendum)
Anesthesia Evaluation  Patient identified by MRN, date of birth, ID band Patient awake    Reviewed: Allergy & Precautions, NPO status , Patient's Chart, lab work & pertinent test results  Airway Mallampati: II  TM Distance: >3 FB Neck ROM: Full    Dental  (+) Loose,    Pulmonary neg pulmonary ROS, Current Smoker,    Pulmonary exam normal        Cardiovascular hypertension, Pt. on medications and Pt. on home beta blockers + angina + CAD, + Past MI and + Cardiac Stents   Rhythm:Regular Rate:Normal     Neuro/Psych negative neurological ROS  negative psych ROS   GI/Hepatic negative GI ROS, Neg liver ROS,   Endo/Other  diabetes, Poorly Controlled, Type 2, Oral Hypoglycemic Agents  Renal/GU negative Renal ROS  negative genitourinary   Musculoskeletal Gangrene left ankle   Abdominal (+)  Abdomen: soft. Bowel sounds: normal.  Peds  Hematology negative hematology ROS (+)   Anesthesia Other Findings   Reproductive/Obstetrics                            Anesthesia Physical Anesthesia Plan  ASA: 3 and emergent  Anesthesia Plan: General   Post-op Pain Management:    Induction: Intravenous  PONV Risk Score and Plan: 1 and Ondansetron, Treatment may vary due to age or medical condition and Midazolam  Airway Management Planned: Mask and Oral ETT  Additional Equipment: None  Intra-op Plan:   Post-operative Plan: Extubation in OR  Informed Consent: I have reviewed the patients History and Physical, chart, labs and discussed the procedure including the risks, benefits and alternatives for the proposed anesthesia with the patient or authorized representative who has indicated his/her understanding and acceptance.     Dental advisory given  Plan Discussed with: CRNA  Anesthesia Plan Comments: (Lab Results      Component                Value               Date                      WBC                       25.5 (H)            02/24/2021                HGB                      10.1 (L)            02/24/2021                HCT                      30.4 (L)            02/24/2021                MCV                      88.9                02/24/2021                PLT  396                 02/24/2021           Lab Results      Component                Value               Date                      NA                       130 (L)             02/24/2021                K                        4.1                 02/24/2021                CO2                      21 (L)              02/24/2021                GLUCOSE                  264 (H)             02/24/2021                BUN                      30 (H)              02/24/2021                CREATININE               2.40 (H)            02/24/2021                CALCIUM                  8.4 (L)             02/24/2021                GFRNONAA                 32 (L)              02/24/2021                GFRAA                    86                  09/09/2020           ECHO 02/20/21: 1. Anterior, anteroseptal and inferoseptal hypokinesis. Left ventricular  ejection fraction, by estimation, is 30 to 35%. The left ventricle has  moderately decreased function. The left ventricle demonstrates regional  wall motion abnormalities (see  scoring diagram/findings for description). There is mild concentric left  ventricular hypertrophy. Left ventricular diastolic parameters are  consistent with Grade  I diastolic dysfunction (impaired relaxation).  2. Right ventricular systolic function is normal. The right ventricular  size is normal. There is normal pulmonary artery systolic pressure.  3. The mitral valve is normal in structure. Trivial mitral valve  regurgitation. No evidence of mitral stenosis.  4. The aortic valve is tricuspid. Aortic valve regurgitation is trivial.  No aortic stenosis is present.  5. Aortic  dilatation noted. There is borderline dilatation of the aortic  root and of the ascending aorta, measuring 36 mm.  6. The inferior vena cava is normal in size with greater than 50%  respiratory variability, suggesting right atrial pressure of 3 mmHg. )       Anesthesia Quick Evaluation

## 2021-02-24 NOTE — Transfer of Care (Signed)
Immediate Anesthesia Transfer of Care Note  Patient: Adam James  Procedure(s) Performed: IRRIGATION AND DEBRIDEMENT EXTREMITY (Left: Ankle)  Patient Location: PACU  Anesthesia Type:General  Level of Consciousness: awake, alert  and oriented  Airway & Oxygen Therapy: Patient connected to nasal cannula oxygen  Post-op Assessment: Report given to RN and Post -op Vital signs reviewed and stable  Post vital signs: Reviewed and stable  Last Vitals:  Vitals Value Taken Time  BP    Temp    Pulse    Resp    SpO2      Last Pain:  Vitals:   02/24/21 1708  TempSrc: Oral  PainSc:          Complications: No notable events documented.

## 2021-02-24 NOTE — ED Triage Notes (Signed)
Pt sent to the ED for further evaluation after having abnormal labs this morning. Pt went to have presurgical work up today for triple bypass. Sent here to be admitted for work up instead of a direct admit. Pt vomiting in triage.  Also c/o left foot pain and swelling. Pt pale and clammy. SBP 70-80's HR 112-120's

## 2021-02-24 NOTE — Consult Note (Addendum)
NAME:  Adam James, MRN:  [id number], DOB:  05-15-1971, LOS: 0 ADMISSION DATE:  02/24/2021, CONSULTATION DATE:  02/24/21 REFERRING MD:  Tegeler, ED CHIEF COMPLAINT:  leg pain   History of Present Illness:  50 year old man who presented to the ED with left lower leg pain, reportedly hypotensive in triage with subsequent normotension once on the monitor whom we are consulted for evaluation of hypotension in the setting of necrotizing fasciitis of left lower extremity.  ED note reviewed.  Orthopedic note reviewed.  Patient noted pain in left lower leg started about 1 week ago.  Mild progressive swelling.  Seems like erythema had extended more proximally in the left lateral leg.  Pain was worse so came to ED today.  Hard to walk.  In triage SBP reportedly in the 70s.  He is placed on the monitor and was noted to be normotensive.  Has been tachycardic in the low 100s in the ED.  Initial lactate 2.5.  Improved to  normal after 500 cc bolus.  Was given vancomycin and Zosyn.  Labs revealed worsening AKI/renal failure compared to values 02/21/2021 and in the spring 2022.  Plain films left lower extremity showed gas.  Orthopedics was consulted.  They recommended emergent surgery.  Patient rolling to the OR at time of evaluation was limits full history.  Reviewed plain film left foot which shows a pocket of gas superior to the digits of the foot.  Pertinent  Medical History  Ischemic cardiomyopathy, diabetes  Significant Hospital Events: Including procedures, antibiotic start and stop dates in addition to other pertinent events   8/1 left lower leg pain, erythema, swelling, concern for gas on plain film of left lower extremity, to OR with Ortho emergently  Interim History / Subjective:  N/a  Objective   Blood pressure (!) 144/85, pulse (!) 103, temperature 99.1 F (37.3 C), temperature source Oral, resp. rate 18, SpO2 98 %.       No intake or output data in the 24 hours ending 02/24/21 2133 There  were no vitals filed for this visit.  Examination: General: Sitting in chair, in no acute distress Eyes: EOMI, no icterus Neck: Supple, no JVP appreciated seems about 60 degrees, supple Lungs: Clear, normal work of breathing on room air Cardiovascular: Tachycardic, regular rhythm, warm Abdomen: Nondistended, bowel sounds present Extremities: Right leg without significant edema, left leg with significant swelling from foot to mid shin with area of erythema starts just above lateral malleolus and extends to the upper shin on the lateral aspect of the lower extremity Neuro: No weakness, sensation intact Psych: Normal mood, flat affect  Resolved Hospital Problem list     Assessment & Plan:  Severe sepsis with AKI and septic shock with elevated lactic acidosis in the setting of skin and soft tissue, necrotizing fasciitis of left lower extremity: Lactic acid normalized with small fluid bolus.  Appears relatively well.  Swollen, erythematous skin overlying lateral aspect left lower extremity.  Blood pressures okay at time of evaluation. --History of cardiomyopathy, judicious fluids is reasonable, but if blood pressures trend down would recommend intermittent boluses with goal of 30 cc/kg per sepsis guidelines with intermittent reassessment of patient's volume status and respiratory status --Broad-spectrum antibiotics, vancomycin and Zosyn currently ordered which is reasonable -- Hold amlodipine, other antihypertensives as below  Acute kidney injury/acute renal failure: Creatinine 2.4, recent 1.7 although baseline low ones earlier in 2022.  Suspect related to sepsis.  Has made some urine in the ED.  Suspect prerenal in  the setting of vasodilatory shock. --Gentle hydration, encourage p.o. intake when able  Ischemic cardiomyopathy: Most recent EF around 30%.  Scheduled for CABG 8/2. --Hold home ARB, Lasix, metoprolol in the setting of infection and concern for low blood pressures in triage, resume as  able  Diabetes: --Hold home oral meds --Sliding scale insulin recommended, consider long-acting insulin per primary postop  Personally discussed case with ED attending Dr. Rush Landmark and orthopedic attending Dr. Carola Frost.  Given current vital signs, recommend admission to [facility], progressive level of care.  Discussed I am happy to reevaluate at any time.  If there are concerns intraoperatively or postoperatively please contact PCCM for further assistance  Best Practice (right click and "Reselect all SmartList Selections" daily)   Per primary  Labs   CBC: Recent Labs  Lab 02/21/21 1053 02/24/21 1708  WBC 25.2* 25.5*  HGB 10.4* 10.1*  HCT 31.0* 30.4*  MCV 87.6 88.9  PLT 382 396    Basic Metabolic Panel: Recent Labs  Lab 02/21/21 1053 02/24/21 1708  NA 132* 130*  K 4.2 4.1  CL 105 96*  CO2 20* 21*  GLUCOSE 259* 264*  BUN 18 30*  CREATININE 1.72* 2.40*  CALCIUM 8.1* 8.4*   GFR: Estimated Creatinine Clearance: 50.5 mL/min (A) (by C-G formula based on SCr of 2.4 mg/dL (H)). Recent Labs  Lab 02/21/21 1053 02/24/21 1708 02/24/21 1802 02/24/21 1926  WBC 25.2* 25.5*  --   --   LATICACIDVEN  --   --  2.5* 1.0    Liver Function Tests: Recent Labs  Lab 02/21/21 1053 02/24/21 1708  AST 11* 20  ALT 10 13  ALKPHOS 74 97  BILITOT 0.3 0.7  PROT 6.4* 6.9  ALBUMIN 2.1* 1.9*   Recent Labs  Lab 02/24/21 1708  LIPASE 30   No results for input(s): AMMONIA in the last 168 hours.  ABG    Component Value Date/Time   PHART 7.426 02/21/2021 1043   PCO2ART 32.7 02/21/2021 1043   PO2ART 84.9 02/21/2021 1043   HCO3 21.1 02/21/2021 1043   ACIDBASEDEF 2.6 (H) 02/21/2021 1043   O2SAT 96.6 02/21/2021 1043     Coagulation Profile: Recent Labs  Lab 02/21/21 1053  INR 1.3*    Cardiac Enzymes: No results for input(s): CKTOTAL, CKMB, CKMBINDEX, TROPONINI in the last 168 hours.  HbA1C: Hgb A1c MFr Bld  Date/Time Value Ref Range Status  02/21/2021 10:53 AM 8.8 (H) 4.8 - 5.6  % Final    Comment:    (NOTE) Pre diabetes:          5.7%-6.4%  Diabetes:              >6.4%  Glycemic control for   <7.0% adults with diabetes     CBG: Recent Labs  Lab 02/21/21 1004  GLUCAP 247*    Review of Systems:   No increase in DOE, orthopnea, PND. Comprehensive ROS otherwise negative.  Past Medical History:  He,  has a past medical history of CAD (coronary artery disease), Cardiomyopathy (HCC), Diabetic foot ulcer (HCC), Dyslipidemia, Heart failure (HCC), Myocardial infarction (HCC), and Type 2 diabetes mellitus (HCC).   Surgical History:   Past Surgical History:  Procedure Laterality Date   APPENDECTOMY     CARDIAC CATHETERIZATION     COLON SURGERY     LEFT HEART CATH AND CORONARY ANGIOGRAPHY N/A 09/13/2020   Procedure: LEFT HEART CATH AND CORONARY ANGIOGRAPHY;  Surgeon: Lennette Bihari, MD;  Location: [facility];  Service: Cardiovascular;  Laterality: N/A;   TOE AMPUTATION  2020     Social History:   reports that he has been smoking cigarettes. He has never used smokeless tobacco. He reports previous alcohol use. He reports previous drug use. Drug: Marijuana.   Family History:  His family history includes Congestive Heart Failure in his brother; Heart failure in his father; Hypertension in his brother and mother.   Allergies Allergies  Allergen Reactions   Morphine Itching     Home Medications  Prior to Admission medications   Medication Sig Start Date End Date Taking? Authorizing Provider  amLODipine (NORVASC) 5 MG tablet Take 1 tablet (5 mg total) by mouth daily. 02/20/21   Baldo Daub, MD  aspirin EC 81 MG tablet Take 1 tablet (81 mg total) by mouth daily. Swallow whole. 02/20/21   Baldo Daub, MD  atorvastatin (LIPITOR) 80 MG tablet Take 1 tablet (80 mg total) by mouth daily. 02/20/21   Baldo Daub, MD  furosemide (LASIX) 40 MG tablet Take 1 tablet (40 mg total) by mouth daily. Take 40 mg every other day alternating with 2 tablets  (80mg ) every other day 02/20/21   02/22/21, MD  glyBURIDE (DIABETA) 5 MG tablet Take 5 mg by mouth 2 (two) times daily. 10/12/20   [provider]  irbesartan (AVAPRO) 75 MG tablet Take 1 tablet (75 mg total) by mouth daily. 01/20/21   01/22/21, MD  metFORMIN (GLUCOPHAGE) 500 MG tablet Take 500 mg by mouth 2 (two) times daily. 10/12/20   [provider]  metoprolol succinate (TOPROL-XL) 25 MG 24 hr tablet Take 1 tablet (25 mg total) by mouth daily. 01/20/21   01/22/21, MD  silver sulfADIAZINE (SILVADENE) 1 % cream Apply 1 application topically daily.    [provider]     Critical care time: n/a

## 2021-02-24 NOTE — Consult Note (Signed)
[facility] Consultation  Reason for Consult: Left foot and leg gas gangrene Referring Physician: Theda Belfast, MD  Adam James is an 50 y.o. male.  HPI: Patient reports noticeable left leg and calf pain over the last day or two. H/o foot ulcerations for which he has sought treatment and received antibiotics. The evaluating provider told him the ulcers were healing, which he flatly denied. Stopped taking Lasix four weeks ago because was "not prescribed [a continuance]" on his prescriptions. Does not check blood sugar, but takes sugar if feels weak or clammy. Has noticed ulcer drainage and excessive depth of wound into which his sock has been sticking on the left. 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 Was scheduled for CABG by Dr. Brynda Greathouse tomorrow but obviously cancelled.   Past Medical History:  Diagnosis Date   CAD (coronary artery disease)    Cardiomyopathy (HCC)    Diabetic foot ulcer (HCC)    Dyslipidemia    Heart failure (HCC)    Myocardial infarction (HCC)    Type 2 diabetes mellitus (HCC)     Past Surgical History:  Procedure Laterality Date   APPENDECTOMY     CARDIAC CATHETERIZATION     COLON SURGERY     LEFT HEART CATH AND CORONARY ANGIOGRAPHY N/A 09/13/2020   Procedure: LEFT HEART CATH AND CORONARY ANGIOGRAPHY;  Surgeon: Lennette Bihari, MD;  Location: [facility];  Service: Cardiovascular;  Laterality: N/A;   TOE AMPUTATION  2020    Family History  Problem Relation Age of Onset   Hypertension Mother    Heart failure Father    Hypertension Brother    Congestive Heart Failure Brother     Social History:  reports that he has been smoking cigarettes. He has never used smokeless tobacco. He reports previous alcohol use. He reports previous drug use. Drug: Marijuana.  Allergies:  Allergies  Allergen Reactions   Morphine Itching    Medications: I have reviewed with note re Lasix above. Medication Sig Start Date  End Date Taking? Authorizing Provider  amLODipine (NORVASC) 5 MG tablet Take 1 tablet (5 mg total) by mouth daily. 02/20/21     Baldo Daub, MD  aspirin EC 81 MG tablet Take 1 tablet (81 mg total) by mouth daily. Swallow whole. 02/20/21     Baldo Daub, MD  atorvastatin (LIPITOR) 80 MG tablet Take 1 tablet (80 mg total) by mouth daily. 02/20/21     Baldo Daub, MD  furosemide (LASIX) 40 MG tablet Take 1 tablet (40 mg total) by mouth daily. Take 40 mg every other day alternating with 2 tablets (80mg ) every other day 02/20/21     02/22/21, MD  glyBURIDE (DIABETA) 5 MG tablet Take 5 mg by mouth 2 (two) times daily. 10/12/20     [provider]  irbesartan (AVAPRO) 75 MG tablet Take 1 tablet (75 mg total) by mouth daily. 01/20/21     01/22/21, MD  metFORMIN (GLUCOPHAGE) 500 MG tablet Take 500 mg by mouth 2 (two) times daily. 10/12/20     [provider]  metoprolol succinate (TOPROL-XL) 25 MG 24 hr tablet Take 1 tablet (25 mg total) by mouth daily. 01/20/21     01/22/21, MD  silver sulfADIAZINE (SILVADENE) 1 % cream Apply 1 application topically daily.       [provider]     Results for orders placed or performed during the hospital encounter of  02/24/21 (from the past 48 hour(s))  Lipase, blood     Status: None   Collection Time: 02/24/21  5:08 PM  Result Value Ref Range   Lipase 30 11 - 51 U/L    Comment: Performed at [facility], [address], [place], [place] [address]  Comprehensive metabolic panel     Status: Abnormal   Collection Time: 02/24/21  5:08 PM  Result Value Ref Range   Sodium 130 (L) 135 - 145 mmol/L   Potassium 4.1 3.5 - 5.1 mmol/L   Chloride 96 (L) 98 - 111 mmol/L   CO2 21 (L) 22 - 32 mmol/L   Glucose, Bld 264 (H) 70 - 99 mg/dL    Comment: Glucose reference range applies only to samples taken after fasting for at least 8 hours.   BUN 30 (H) 6 - 20 mg/dL   Creatinine, Ser 3.38 (H) 0.61 - 1.24 mg/dL   Calcium  8.4 (L) 8.9 - 10.3 mg/dL   Total Protein 6.9 6.5 - 8.1 g/dL   Albumin 1.9 (L) 3.5 - 5.0 g/dL   AST 20 15 - 41 U/L   ALT 13 0 - 44 U/L   Alkaline Phosphatase 97 38 - 126 U/L   Total Bilirubin 0.7 0.3 - 1.2 mg/dL   GFR, Estimated 32 (L) >60 mL/min    Comment: (NOTE) Calculated using the CKD-EPI Creatinine Equation (2021)    Anion gap 13 5 - 15    Comment: Performed at [facility], [address], [place], [place] [address]  CBC     Status: Abnormal   Collection Time: 02/24/21  5:08 PM  Result Value Ref Range   WBC 25.5 (H) 4.0 - 10.5 K/uL   RBC 3.42 (L) 4.22 - 5.81 MIL/uL   Hemoglobin 10.1 (L) 13.0 - 17.0 g/dL   HCT 97.6 (L) 73.4 - 19.3 %   MCV 88.9 80.0 - 100.0 fL   MCH 29.5 26.0 - 34.0 pg   MCHC 33.2 30.0 - 36.0 g/dL   RDW 79.0 24.0 - 97.3 %   Platelets 396 150 - 400 K/uL   nRBC 0.0 0.0 - 0.2 %    Comment: Performed at [facility], [address], [place], [place] [address]  Troponin I (High Sensitivity)     Status: Abnormal   Collection Time: 02/24/21  5:08 PM  Result Value Ref Range   Troponin I (High Sensitivity) 34 (H) <18 ng/L    Comment: (NOTE) Elevated high sensitivity troponin I (hsTnI) values and significant  changes across serial measurements may suggest ACS but many other  chronic and acute conditions are known to elevate hsTnI results.  Refer to the "Links" section for chest pain algorithms and additional  guidance. Performed at [facility], [address], [place], [place] [address]   Lactic acid, plasma     Status: Abnormal   Collection Time: 02/24/21  6:02 PM  Result Value Ref Range   Lactic Acid, Venous 2.5 (HH) 0.5 - 1.9 mmol/L    Comment: CRITICAL RESULT CALLED TO, READ BACK BY AND VERIFIED WITH: A STRAIGHTER RN BY SSTEPHENS [id number] Performed at [facility], [address] [place], [place] [address]   Lactic acid, plasma     Status: None   Collection Time: 02/24/21  7:26 PM  Result Value Ref Range   Lactic Acid,  Venous 1.0 0.5 - 1.9 mmol/L    Comment: Performed at [facility],  [address] [place], [place] [address]  Troponin I (High Sensitivity)     Status: Abnormal   Collection Time: 02/24/21  7:34 PM  Result Value Ref Range   Troponin I (High Sensitivity) 27 (H) <18 ng/L    Comment: (NOTE) Elevated high sensitivity troponin I (hsTnI) values and significant  changes across serial measurements may suggest ACS but many other  chronic and acute conditions are known to elevate hsTnI results.  Refer to the "Links" section for chest pain algorithms and additional  guidance. Performed at [facility], [address] [place], [place] [address]     DG Tibia/Fibula Left  Result Date: 02/24/2021 CLINICAL DATA:  Left leg cellulitis EXAM: LEFT ANKLE - 2 VIEW; LEFT TIBIA AND FIBULA - 2 VIEW; LEFT FOOT - 2 VIEW COMPARISON:  None. FINDINGS: No fracture or dislocation of the left tibia or fibula, ankle, or foot. Diffuse soft tissue edema about the lower leg, ankle, and foot. There are subcutaneous soft tissue gas loculations about the dorsum of the foot, predominantly overlying the distal fourth and fifth metatarsals. No bony erosion or sclerosis to suggest osteomyelitis. IMPRESSION: 1. Diffuse soft tissue edema about the lower leg, ankle, and foot. 2. There are subcutaneous soft tissue gas loculations about the dorsum of the foot, predominantly overlying the distal fourth and fifth metatarsals. Findings are concerning for gas-forming infection. 3. No bony erosion or sclerosis to suggest osteomyelitis. MRI is the most sensitive test for the detection of bone marrow edema and osteomyelitis if clinically suspected. Electronically Signed   By: Lauralyn PrimesAlex  Bibbey M.D.   On: 02/24/2021 18:58   DG Ankle 2 Views Left  Result Date: 02/24/2021 CLINICAL DATA:  Left leg cellulitis EXAM: LEFT ANKLE - 2 VIEW; LEFT TIBIA AND FIBULA - 2 VIEW; LEFT FOOT - 2 VIEW COMPARISON:  None. FINDINGS: No fracture or dislocation of  the left tibia or fibula, ankle, or foot. Diffuse soft tissue edema about the lower leg, ankle, and foot. There are subcutaneous soft tissue gas loculations about the dorsum of the foot, predominantly overlying the distal fourth and fifth metatarsals. No bony erosion or sclerosis to suggest osteomyelitis. IMPRESSION: 1. Diffuse soft tissue edema about the lower leg, ankle, and foot. 2. There are subcutaneous soft tissue gas loculations about the dorsum of the foot, predominantly overlying the distal fourth and fifth metatarsals. Findings are concerning for gas-forming infection. 3. No bony erosion or sclerosis to suggest osteomyelitis. MRI is the most sensitive test for the detection of bone marrow edema and osteomyelitis if clinically suspected. Electronically Signed   By: Lauralyn PrimesAlex  Bibbey M.D.   On: 02/24/2021 18:58   DG Foot 2 Views Left  Result Date: 02/24/2021 CLINICAL DATA:  Left leg cellulitis EXAM: LEFT ANKLE - 2 VIEW; LEFT TIBIA AND FIBULA - 2 VIEW; LEFT FOOT - 2 VIEW COMPARISON:  None. FINDINGS: No fracture or dislocation of the left tibia or fibula, ankle, or foot. Diffuse soft tissue edema about the lower leg, ankle, and foot. There are subcutaneous soft tissue gas loculations about the dorsum of the foot, predominantly overlying the distal fourth and fifth metatarsals. No bony erosion or sclerosis to suggest osteomyelitis. IMPRESSION: 1. Diffuse soft tissue edema about the lower leg, ankle, and foot. 2. There are subcutaneous soft tissue gas loculations about the dorsum of the foot, predominantly overlying the distal fourth and fifth metatarsals. Findings are concerning for gas-forming infection. 3. No bony erosion or sclerosis to suggest osteomyelitis. MRI is the most sensitive  test for the detection of bone marrow edema and osteomyelitis if clinically suspected. Electronically Signed   By: Lauralyn Primes M.D.   On: 02/24/2021 18:58    ROS Reports light headedness, multiple as above including recent  fever.  Blood pressure 129/81, pulse (!) 102, temperature 99.1 F (37.3 C), temperature source Oral, resp. rate 17, SpO2 97 %. Physical Exam Appears older than stated age. LUEX  No open wounds or sores  Muscle wasting of the hand LLE 2 cm lateral plantar ulcer with active but clear scant drainage under 5th metatarsal head Dorsal swelling of forefoot and flucculence with streaking proximally up to the anterolateral calf Tenderness up to the calf No crepitus Edema/ swelling 3+ and clearly asymmetric  Sens: Decreased DPN, SPN, TN but intact  Motor: EHL, FHL, and lessor toe ext and flex all intact grossly  Brisk cap refill, warm to touch, DP and PT both palpable  RLE Plantar ulcer metartarsal head of the 4th S/p ray amp of 5th No erythema, no warmth  Edema/ swelling controlled  Sens: DPN, SPN, TN intact and better than the left  Motor: EHL, FHL, and lessor toe ext and flex all intact grossly  Brisk cap refill, warm to touch  Assessment/Plan: Left foot and leg gas gangrene Severe CAD Uncontrolled and unmonitored diabetes on Metformin  I discussed with the patient the risks and benefits of surgery, including the possibility of persistent infection, nerve injury, vessel injury, wound breakdown, DVT/ PE, loss of motion, heart attack and stroke.  We also specifically discussed the need to stage surgery and the elevated risk of eventual amputation.  He acknowledged these risks and wished to proceed.   Myrene Galas, MD [facility], [facility] [phone]  02/24/2021  8:44 PM

## 2021-02-24 NOTE — ED Notes (Signed)
Pt refusing to allow nursing staff to obtain rectal temp. EDP notified.

## 2021-02-24 NOTE — Anesthesia Procedure Notes (Signed)
Procedure Name: Intubation Date/Time: 02/24/2021 10:33 PM Performed by: Molli Hazard, CRNA Pre-anesthesia Checklist: Patient identified, Emergency Drugs available, Suction available and Patient being monitored Patient Re-evaluated:Patient Re-evaluated prior to induction Oxygen Delivery Method: Circle system utilized Preoxygenation: Pre-oxygenation with 100% oxygen Induction Type: IV induction Ventilation: Mask ventilation without difficulty Laryngoscope Size: Miller and 2 Grade View: Grade II Tube type: Oral Tube size: 7.5 mm Number of attempts: 1 Airway Equipment and Method: Stylet Placement Confirmation: ETT inserted through vocal cords under direct vision and positive ETCO2 Secured at: 23 cm Tube secured with: Tape Dental Injury: Teeth and Oropharynx as per pre-operative assessment

## 2021-02-24 NOTE — Progress Notes (Signed)
Notified provider and bedside nurse of need to order and draw lactic acid.  

## 2021-02-24 NOTE — Progress Notes (Signed)
Elink following for code sepsis 

## 2021-02-24 NOTE — ED Notes (Signed)
Pt transported to xray 

## 2021-02-24 NOTE — Progress Notes (Signed)
Pharmacy Antibiotic Note  Adam James is a 50 y.o. male admitted on 02/24/2021 with LLE gas gangrene.  Pharmacy has been consulted for Vancomycin and Zosyn dosing. Vancomycin 2000 mg IV given preop at 10 pm.  Plan: Vancomycin 1500 mg IV q24h Zosyn 3.375 g IV q8h   F/U renal function  Temp (24hrs), Avg:98.7 F (37.1 C), Min:98 F (36.7 C), Max:99.1 F (37.3 C)  Recent Labs  Lab 02/21/21 1053 02/24/21 1708 02/24/21 1802 02/24/21 1926  WBC 25.2* 25.5*  --   --   CREATININE 1.72* 2.40*  --   --   LATICACIDVEN  --   --  2.5* 1.0    Estimated Creatinine Clearance: 50.5 mL/min (A) (by C-G formula based on SCr of 2.4 mg/dL (H)).    Allergies  Allergen Reactions   Morphine Itching      Adam James 02/25/2021 1:14 AM

## 2021-02-24 NOTE — ED Provider Notes (Signed)
[facility] EMERGENCY DEPARTMENT Provider Note   CSN: [id number] Arrival date & time: 02/24/21  1535     History Chief Complaint  Patient presents with   Abnormal Lab   Foot Pain   Abdominal Pain    Adam James is a 50 y.o. male with a past medical history of CAD, HLD, heart failure, type 2 diabetes, history of MI presenting for left leg swelling. Patient was found to be hypotensive in the waiting room and was brought emergently back to a room.  His blood pressures were stable when he got back to his room.  Patient is complaining of left leg swelling and left leg pain for the last 3 days.  Patient is suppose to have cardiac surgery this week.  Patient has not been taking his Lasix.  Patient has swelling, redness to his left leg.  He is having difficulty walking.   Leg Pain Location:  Leg Time since incident:  3 days Leg location:  L lower leg Pain details:    Quality:  Aching   Radiates to:  Does not radiate   Duration:  3 days Dislocation: no   Foreign body present:  No foreign bodies Associated symptoms: stiffness and swelling   Associated symptoms: no back pain, no decreased ROM, no fatigue, no fever, no itching, no muscle weakness, no neck pain, no numbness and no tingling       Past Medical History:  Diagnosis Date   CAD (coronary artery disease)    Cardiomyopathy (HCC)    Diabetic foot ulcer (HCC)    Dyslipidemia    Heart failure (HCC)    Myocardial infarction (HCC)    Type 2 diabetes mellitus (HCC)     Patient Active Problem List   Diagnosis Date Noted   Necrotizing fasciitis of ankle and foot (HCC) 02/25/2021   Status post surgery 02/24/2021   Type 2 diabetes mellitus (HCC)    Heart failure (HCC)    Dyslipidemia    Diabetic foot ulcer (HCC)    CAD (coronary artery disease)    Cardiomyopathy (HCC)    Coronary artery disease involving native coronary artery of native heart with angina pectoris [facility])     Past Surgical History:   Procedure Laterality Date   APPENDECTOMY     CARDIAC CATHETERIZATION     COLON SURGERY     LEFT HEART CATH AND CORONARY ANGIOGRAPHY N/A 09/13/2020   Procedure: LEFT HEART CATH AND CORONARY ANGIOGRAPHY;  Surgeon: Lennette Bihari, MD;  Location: [facility] INVASIVE CV LAB;  Service: Cardiovascular;  Laterality: N/A;   TOE AMPUTATION  2020       Family History  Problem Relation Age of Onset   Hypertension Mother    Heart failure Father    Hypertension Brother    Congestive Heart Failure Brother     Social History   Tobacco Use   Smoking status: Every Day    Types: Cigarettes   Smokeless tobacco: Never   Tobacco comments:    5-6 daily  Vaping Use   Vaping Use: Never used  Substance Use Topics   Alcohol use: Not Currently    Comment: Very rare   Drug use: Not Currently    Types: Marijuana    Home Medications Prior to Admission medications   Medication Sig Start Date End Date Taking? Authorizing Provider  amLODipine (NORVASC) 5 MG tablet Take 1 tablet (5 mg total) by mouth daily. 02/20/21   Baldo Daub, MD  aspirin EC 81 MG  tablet Take 1 tablet (81 mg total) by mouth daily. Swallow whole. 02/20/21   Baldo DaubMunley, Brian J, MD  atorvastatin (LIPITOR) 80 MG tablet Take 1 tablet (80 mg total) by mouth daily. 02/20/21   Baldo DaubMunley, Brian J, MD  furosemide (LASIX) 40 MG tablet Take 1 tablet (40 mg total) by mouth daily. Take 40 mg every other day alternating with 2 tablets (80mg ) every other day 02/20/21   Baldo DaubMunley, Brian J, MD  glyBURIDE (DIABETA) 5 MG tablet Take 5 mg by mouth 2 (two) times daily. 10/12/20   [provider]  irbesartan (AVAPRO) 75 MG tablet Take 1 tablet (75 mg total) by mouth daily. 01/20/21   Baldo DaubMunley, Brian J, MD  metFORMIN (GLUCOPHAGE) 500 MG tablet Take 500 mg by mouth 2 (two) times daily. 10/12/20   [provider]  metoprolol succinate (TOPROL-XL) 25 MG 24 hr tablet Take 1 tablet (25 mg total) by mouth daily. 01/20/21   Baldo DaubMunley, Brian J, MD  silver sulfADIAZINE  (SILVADENE) 1 % cream Apply 1 application topically daily.    [provider]    Allergies    Morphine  Review of Systems   Review of Systems  Constitutional:  Negative for activity change, appetite change, chills, fatigue and fever.  HENT:  Negative for congestion, ear pain, sneezing, sore throat and trouble swallowing.   Eyes:  Negative for photophobia, pain and visual disturbance.  Respiratory:  Negative for cough and shortness of breath.   Cardiovascular:  Negative for chest pain and palpitations.  Gastrointestinal:  Negative for abdominal distention, abdominal pain, diarrhea, nausea and vomiting.  Endocrine: Negative for polydipsia and polyuria.  Genitourinary:  Negative for difficulty urinating, dysuria and hematuria.  Musculoskeletal:  Positive for stiffness. Negative for arthralgias, back pain, joint swelling, myalgias, neck pain and neck stiffness.       Left leg pain  Skin:  Negative for color change, itching, rash and wound.  Neurological:  Negative for dizziness, seizures, syncope, light-headedness, numbness and headaches.  Psychiatric/Behavioral:  Negative for agitation, behavioral problems and self-injury.   All other systems reviewed and are negative.  Physical Exam Updated Vital Signs BP 101/72 (BP Location: Left Arm)   Pulse 98   Temp 98 F (36.7 C) (Oral)   Resp 19   Ht 6\' 3"  (1.905 m)   Wt 109 kg   SpO2 100%   BMI 30.04 kg/m   Physical Exam Vitals and nursing note reviewed.  Constitutional:      General: He is not in acute distress.    Appearance: Normal appearance. He is well-developed.  HENT:     Head: Normocephalic and atraumatic.     Right Ear: External ear normal.     Left Ear: External ear normal.     Nose: Nose normal. No congestion.     Mouth/Throat:     Mouth: Mucous membranes are moist.     Pharynx: Oropharynx is clear.  Eyes:     Extraocular Movements: Extraocular movements intact.     Conjunctiva/sclera: Conjunctivae normal.      Pupils: Pupils are equal, round, and reactive to light.  Cardiovascular:     Rate and Rhythm: Normal rate and regular rhythm.     Pulses: Normal pulses.     Heart sounds: Normal heart sounds. No murmur heard. Pulmonary:     Effort: Pulmonary effort is normal. No respiratory distress.     Breath sounds: Normal breath sounds. No wheezing or rales.  Abdominal:     General: Bowel sounds  are normal. There is no distension.     Palpations: Abdomen is soft.     Tenderness: There is no abdominal tenderness. There is no right CVA tenderness, left CVA tenderness, guarding or rebound.  Musculoskeletal:     Cervical back: Normal range of motion and neck supple.     Right lower leg: Normal.     Left lower leg: Swelling and tenderness present. Edema present.     Right ankle: Normal.     Left ankle: Swelling present. Tenderness present. Decreased range of motion. Normal pulse.     Right foot: Normal.     Left foot: Swelling and tenderness present. Normal pulse.  Skin:    General: Skin is warm and dry.     Coloration: Skin is not jaundiced.  Neurological:     General: No focal deficit present.     Mental Status: He is alert and oriented to person, place, and time. Mental status is at baseline.     Cranial Nerves: No cranial nerve deficit.     Sensory: No sensory deficit.     Motor: No weakness.     Gait: Gait normal.  Psychiatric:        Mood and Affect: Mood normal.        Behavior: Behavior normal.        Thought Content: Thought content normal.        Judgment: Judgment normal.    ED Results / Procedures / Treatments   Labs (all labs ordered are listed, but only abnormal results are displayed) Labs Reviewed  COMPREHENSIVE METABOLIC PANEL - Abnormal; Notable for the following components:      Result Value   Sodium 130 (*)    Chloride 96 (*)    CO2 21 (*)    Glucose, Bld 264 (*)    BUN 30 (*)    Creatinine, Ser 2.40 (*)    Calcium 8.4 (*)    Albumin 1.9 (*)    GFR, Estimated  32 (*)    All other components within normal limits  CBC - Abnormal; Notable for the following components:   WBC 25.5 (*)    RBC 3.42 (*)    Hemoglobin 10.1 (*)    HCT 30.4 (*)    All other components within normal limits  URINALYSIS, ROUTINE W REFLEX MICROSCOPIC - Abnormal; Notable for the following components:   APPearance CLOUDY (*)    Specific Gravity, Urine >1.030 (*)    Glucose, UA >=500 (*)    Hgb urine dipstick LARGE (*)    Protein, ur >300 (*)    All other components within normal limits  LACTIC ACID, PLASMA - Abnormal; Notable for the following components:   Lactic Acid, Venous 2.5 (*)    All other components within normal limits  URINALYSIS, MICROSCOPIC (REFLEX) - Abnormal; Notable for the following components:   Bacteria, UA RARE (*)    All other components within normal limits  GLUCOSE, CAPILLARY - Abnormal; Notable for the following components:   Glucose-Capillary 173 (*)    All other components within normal limits  GLUCOSE, CAPILLARY - Abnormal; Notable for the following components:   Glucose-Capillary 278 (*)    All other components within normal limits  TROPONIN I (HIGH SENSITIVITY) - Abnormal; Notable for the following components:   Troponin I (High Sensitivity) 34 (*)    All other components within normal limits  TROPONIN I (HIGH SENSITIVITY) - Abnormal; Notable for the following components:   Troponin I (High Sensitivity) 27 (*)  All other components within normal limits  CULTURE, BLOOD (ROUTINE X 2)  CULTURE, BLOOD (ROUTINE X 2)  FUNGUS CULTURE WITH STAIN  AEROBIC/ANAEROBIC CULTURE W GRAM STAIN (SURGICAL/DEEP WOUND)  LIPASE, BLOOD  LACTIC ACID, PLASMA  MISCELLANEOUS GENETIC TEST  SEDIMENTATION RATE  HIV ANTIBODY (ROUTINE TESTING W REFLEX)  BRAIN NATRIURETIC PEPTIDE    EKG None  Radiology DG Tibia/Fibula Left  Result Date: 02/24/2021 CLINICAL DATA:  Left leg cellulitis EXAM: LEFT ANKLE - 2 VIEW; LEFT TIBIA AND FIBULA - 2 VIEW; LEFT FOOT - 2  VIEW COMPARISON:  None. FINDINGS: No fracture or dislocation of the left tibia or fibula, ankle, or foot. Diffuse soft tissue edema about the lower leg, ankle, and foot. There are subcutaneous soft tissue gas loculations about the dorsum of the foot, predominantly overlying the distal fourth and fifth metatarsals. No bony erosion or sclerosis to suggest osteomyelitis. IMPRESSION: 1. Diffuse soft tissue edema about the lower leg, ankle, and foot. 2. There are subcutaneous soft tissue gas loculations about the dorsum of the foot, predominantly overlying the distal fourth and fifth metatarsals. Findings are concerning for gas-forming infection. 3. No bony erosion or sclerosis to suggest osteomyelitis. MRI is the most sensitive test for the detection of bone marrow edema and osteomyelitis if clinically suspected. Electronically Signed   By: Lauralyn Primes M.D.   On: 02/24/2021 18:58   DG Ankle 2 Views Left  Result Date: 02/24/2021 CLINICAL DATA:  Left leg cellulitis EXAM: LEFT ANKLE - 2 VIEW; LEFT TIBIA AND FIBULA - 2 VIEW; LEFT FOOT - 2 VIEW COMPARISON:  None. FINDINGS: No fracture or dislocation of the left tibia or fibula, ankle, or foot. Diffuse soft tissue edema about the lower leg, ankle, and foot. There are subcutaneous soft tissue gas loculations about the dorsum of the foot, predominantly overlying the distal fourth and fifth metatarsals. No bony erosion or sclerosis to suggest osteomyelitis. IMPRESSION: 1. Diffuse soft tissue edema about the lower leg, ankle, and foot. 2. There are subcutaneous soft tissue gas loculations about the dorsum of the foot, predominantly overlying the distal fourth and fifth metatarsals. Findings are concerning for gas-forming infection. 3. No bony erosion or sclerosis to suggest osteomyelitis. MRI is the most sensitive test for the detection of bone marrow edema and osteomyelitis if clinically suspected. Electronically Signed   By: Lauralyn Primes M.D.   On: 02/24/2021 18:58   DG  Foot 2 Views Left  Result Date: 02/24/2021 CLINICAL DATA:  Left leg cellulitis EXAM: LEFT ANKLE - 2 VIEW; LEFT TIBIA AND FIBULA - 2 VIEW; LEFT FOOT - 2 VIEW COMPARISON:  None. FINDINGS: No fracture or dislocation of the left tibia or fibula, ankle, or foot. Diffuse soft tissue edema about the lower leg, ankle, and foot. There are subcutaneous soft tissue gas loculations about the dorsum of the foot, predominantly overlying the distal fourth and fifth metatarsals. No bony erosion or sclerosis to suggest osteomyelitis. IMPRESSION: 1. Diffuse soft tissue edema about the lower leg, ankle, and foot. 2. There are subcutaneous soft tissue gas loculations about the dorsum of the foot, predominantly overlying the distal fourth and fifth metatarsals. Findings are concerning for gas-forming infection. 3. No bony erosion or sclerosis to suggest osteomyelitis. MRI is the most sensitive test for the detection of bone marrow edema and osteomyelitis if clinically suspected. Electronically Signed   By: Lauralyn Primes M.D.   On: 02/24/2021 18:58    Procedures Procedures   Medications Ordered in ED Medications  acetaminophen (OFIRMEV) 10 MG/ML  IV (has no administration in time range)  piperacillin-tazobactam (ZOSYN) IVPB 3.375 g (has no administration in time range)  vancomycin (VANCOREADY) IVPB 1500 mg/300 mL (has no administration in time range)  vancomycin (VANCOREADY) IVPB 2000 mg/400 mL (2,000 mg Intravenous Given 02/24/21 2204)  piperacillin-tazobactam (ZOSYN) IVPB 3.375 g (3.375 g Intravenous New Bag/Given 02/24/21 1739)  lactated ringers bolus 500 mL (0 mLs Intravenous Stopped 02/24/21 1910)  insulin aspart (novoLOG) injection 10 Units (10 Units Intravenous Given 02/25/21 0034)    ED Course  I have reviewed the triage vital signs and the nursing notes.  Pertinent labs & imaging results that were available during my care of the patient were reviewed by me and considered in my medical decision making (see chart for  details).    MDM Rules/Calculators/A&P                         Critical care time 45 minutes  Patient is a 50 year old male that is presenting for hypotension and left leg swelling.  Patient was found to be hypotensive in the 70s systolic in the waiting room.  He was given 500 cc of IV fluids due to EF of 30 to 35% on previous echo.  Code sepsis activated.  Patient given vancomycin and Zosyn.  Patient's x-ray showed subcutaneous gas loculations with no fractures or dislocations.  Orthopedic surgery was consulted for evaluation.  Patient CBC is notable for a leukocytosis of 25.9.  Patient's lactate was 2.5.  Patient has an AKI with a creatinine of 2.5 from 1.72.  1:45 AM Orthopedic surgery was consulted for evaluation due soft tissue gas seen on x-ray. Orthopedic surgery will evaluate the patient and plan for MRI left leg and left ankle.   Critical care evaluated the patient and think that he is safe and stable for a progressive bed.  Critical care will follow along if needed.    Patient patient is going straight to the OR with orthopedic surgery on the left leg due to concern for necrotizing fasciitis.  Patient stable prior to transfer to the OR.  Patient will be admitted to internal medicine service for further evaluation.   Final Clinical Impression(s) / ED Diagnoses Final diagnoses:  Necrotizing fasciitis (HCC)  Left leg cellulitis  Ulcerated, foot [facility])    Rx / DC Orders ED Discharge Orders     None        Lottie Dawson, MD 02/25/21 0145    Tegeler, Canary Brim, MD 02/25/21 1121

## 2021-02-25 ENCOUNTER — Encounter (HOSPITAL_COMMUNITY): Admission: RE | Payer: Self-pay | Source: Home / Self Care

## 2021-02-25 ENCOUNTER — Inpatient Hospital Stay (HOSPITAL_COMMUNITY): Payer: Self-pay

## 2021-02-25 ENCOUNTER — Inpatient Hospital Stay (HOSPITAL_COMMUNITY)
Admission: RE | Admit: 2021-02-25 | Payer: Self-pay | Source: Home / Self Care | Admitting: Thoracic Surgery (Cardiothoracic Vascular Surgery)

## 2021-02-25 ENCOUNTER — Encounter (HOSPITAL_COMMUNITY): Payer: Self-pay | Admitting: Student in an Organized Health Care Education/Training Program

## 2021-02-25 DIAGNOSIS — I96 Gangrene, not elsewhere classified: Secondary | ICD-10-CM

## 2021-02-25 LAB — BASIC METABOLIC PANEL
Anion gap: 13 (ref 5–15)
BUN: 32 mg/dL — ABNORMAL HIGH (ref 6–20)
CO2: 21 mmol/L — ABNORMAL LOW (ref 22–32)
Calcium: 8.2 mg/dL — ABNORMAL LOW (ref 8.9–10.3)
Chloride: 97 mmol/L — ABNORMAL LOW (ref 98–111)
Creatinine, Ser: 2.16 mg/dL — ABNORMAL HIGH (ref 0.61–1.24)
GFR, Estimated: 37 mL/min — ABNORMAL LOW (ref >60)
Glucose, Bld: 113 mg/dL — ABNORMAL HIGH (ref 70–99)
Potassium: 3.8 mmol/L (ref 3.5–5.1)
Sodium: 131 mmol/L — ABNORMAL LOW (ref 135–145)

## 2021-02-25 LAB — BLOOD CULTURE ID PANEL (REFLEXED) - BCID2

## 2021-02-25 LAB — CBC
HCT: 26.9 % — ABNORMAL LOW (ref 39.0–52.0)
Hemoglobin: 8.9 g/dL — ABNORMAL LOW (ref 13.0–17.0)
MCH: 28.7 pg (ref 26.0–34.0)
MCHC: 33.1 g/dL (ref 30.0–36.0)
MCV: 86.8 fL (ref 80.0–100.0)
Platelets: 303 10*3/uL (ref 150–400)
RBC: 3.1 MIL/uL — ABNORMAL LOW (ref 4.22–5.81)
RDW: 12.5 % (ref 11.5–15.5)
WBC: 21.2 10*3/uL — ABNORMAL HIGH (ref 4.0–10.5)
nRBC: 0 % (ref 0.0–0.2)

## 2021-02-25 LAB — SEDIMENTATION RATE: Sed Rate: >140 mm/hr — ABNORMAL HIGH (ref 0–16)

## 2021-02-25 LAB — GLUCOSE, CAPILLARY
Glucose-Capillary: 173 mg/dL — ABNORMAL HIGH (ref 70–99)
Glucose-Capillary: 278 mg/dL — ABNORMAL HIGH (ref 70–99)
Glucose-Capillary: 105 mg/dL — ABNORMAL HIGH (ref 70–99)
Glucose-Capillary: 127 mg/dL — ABNORMAL HIGH (ref 70–99)
Glucose-Capillary: 165 mg/dL — ABNORMAL HIGH (ref 70–99)
Glucose-Capillary: 305 mg/dL — ABNORMAL HIGH (ref 70–99)
Glucose-Capillary: 171 mg/dL — ABNORMAL HIGH (ref 70–99)
Glucose-Capillary: 249 mg/dL — ABNORMAL HIGH (ref 70–99)

## 2021-02-25 LAB — BRAIN NATRIURETIC PEPTIDE: B Natriuretic Peptide: 103.1 pg/mL — ABNORMAL HIGH (ref 0.0–100.0)

## 2021-02-25 LAB — HIV ANTIBODY (ROUTINE TESTING W REFLEX): HIV Screen 4th Generation wRfx: NONREACTIVE

## 2021-02-25 IMAGING — CR DG FOOT 2V*R*
2 series · 2 of 2 positions shown · non-contrast
Comparison: Right foot images [DATE]

CLINICAL DATA: Ulcerate foot.

EXAM:
RIGHT FOOT - 2 VIEW

[foot ap]
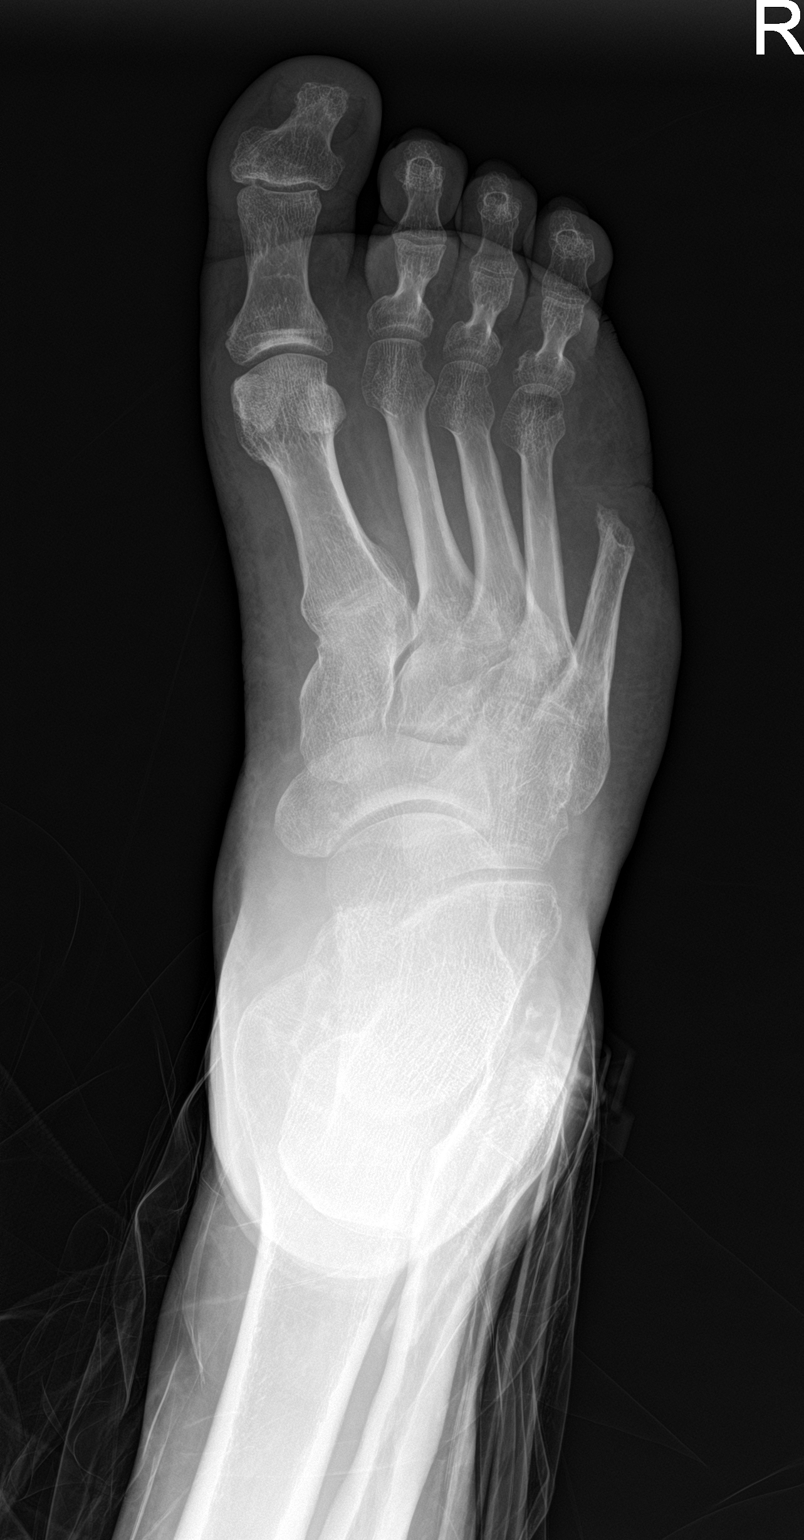

[foot lat]
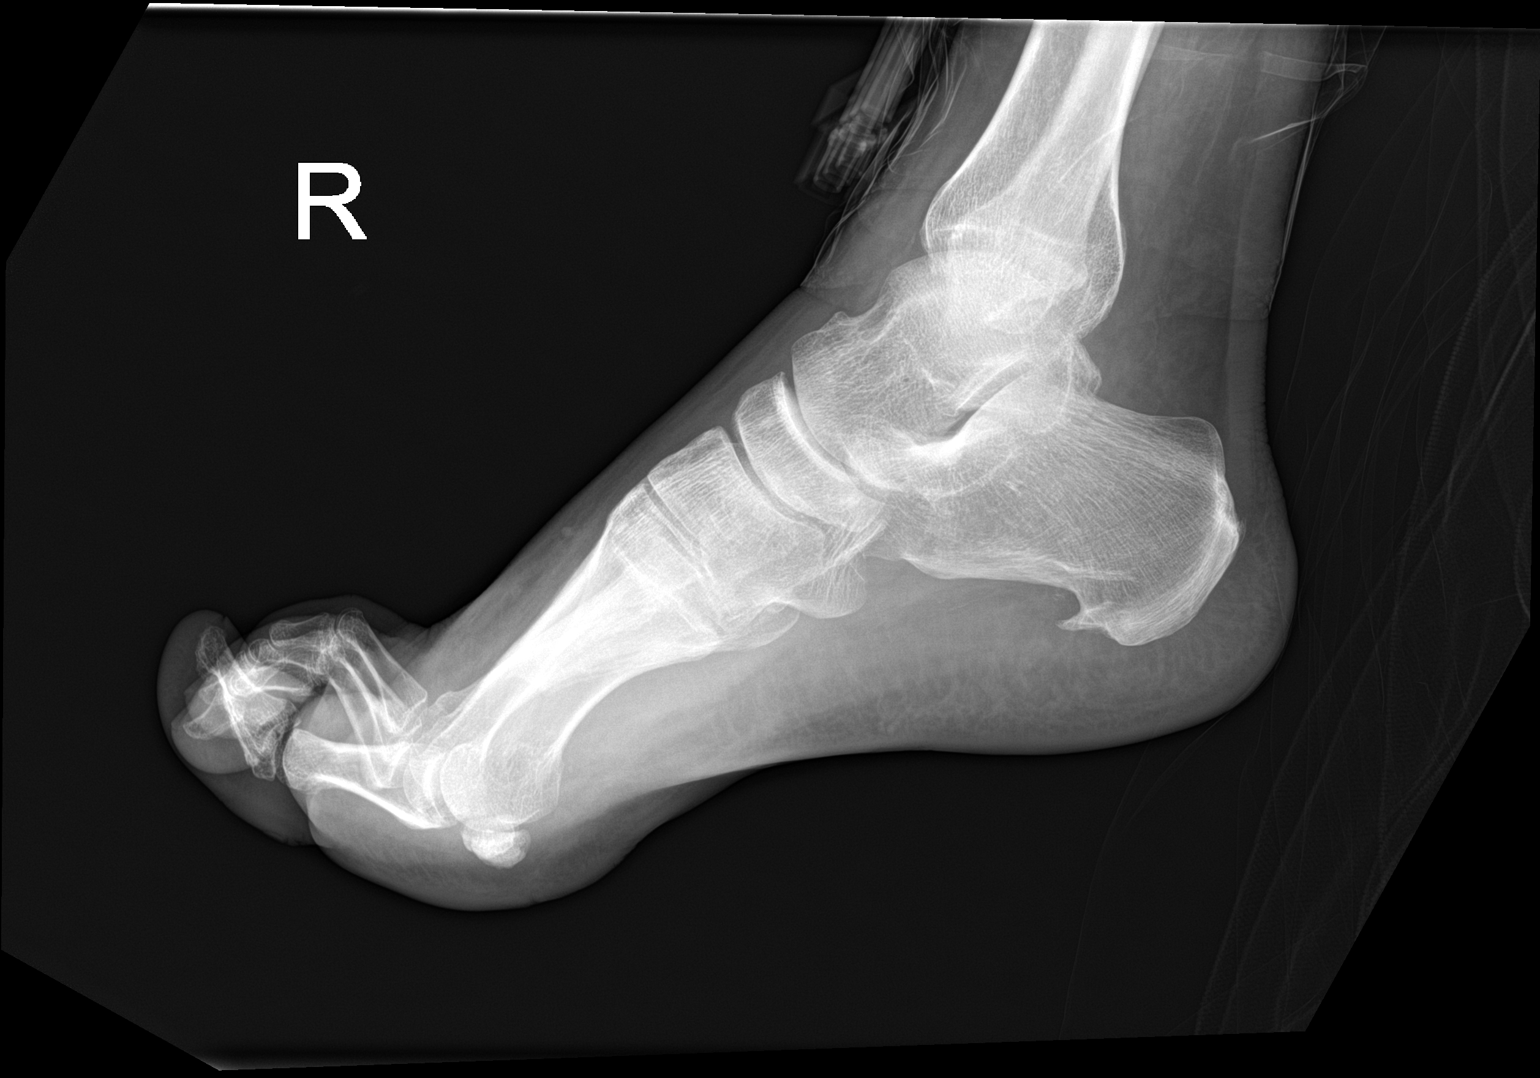

[2 of 2 positions shown; findings below may reference images not displayed]

FINDINGS: Soft tissue swelling cannot be excluded. No foreign body. Prior
right fifth transmetatarsal amputation. No acute bony abnormality.
No bony erosions. Mild degenerative changes first MTP joint. Mild
calcaneal spurring.
IMPRESSION: Soft tissue swelling cannot be excluded. No radiopaque foreign body.
Transmetatarsal amputation right fifth digit. No acute or focal bony
abnormality. No bony erosions.

## 2021-02-25 IMAGING — MR MR ANKLE*L* W/O CM
4 of 5 series · 17 of 40 positions shown · non-contrast
Comparison: Radiograph [DATE]

CLINICAL DATA: Soft tissue infection suspected, ankle, xray done
concern for necrotizing fasciitis

EXAM:
MRI OF THE LEFT ANKLE WITHOUT CONTRAST
TECHNIQUE: Multiplanar, multisequence MR imaging of the ankle was performed. No
intravenous contrast was administered.

[Series 5: T1 · sagittal · 4.0mm · 0.35mm/px · 3 of 23 slices shown]
[im 1/23]
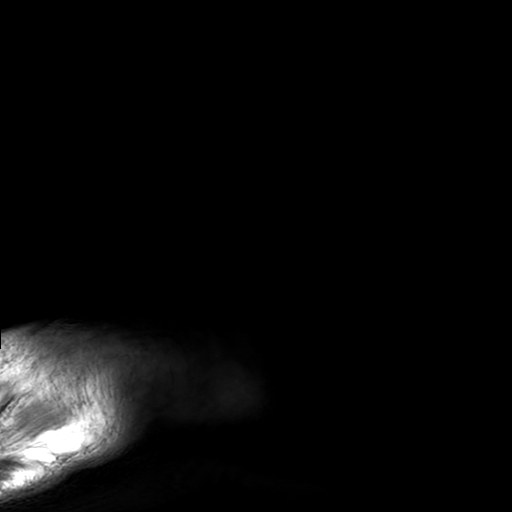
[im 12/23]
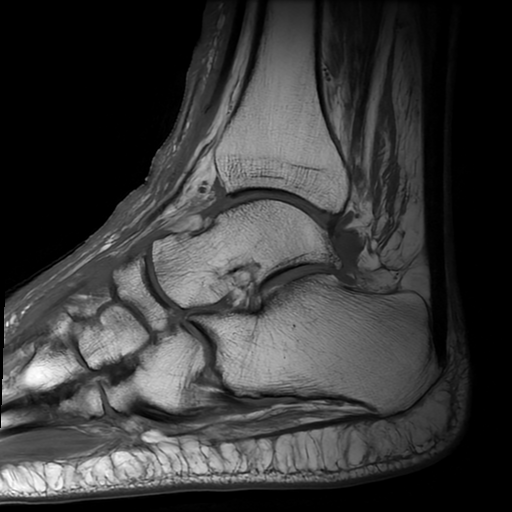
[im 23/23]
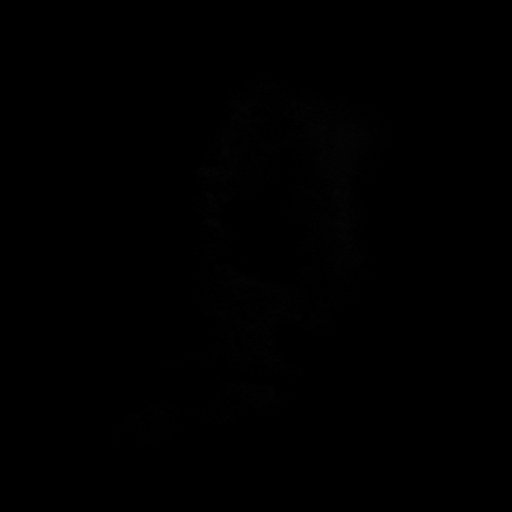

[Series 6: T2 fat-sat · axial · 3.0mm · 0.33mm/px · z∈[-59,+60]mm · 3 of 43 slices shown (1 of 2)]
[im 6/43]
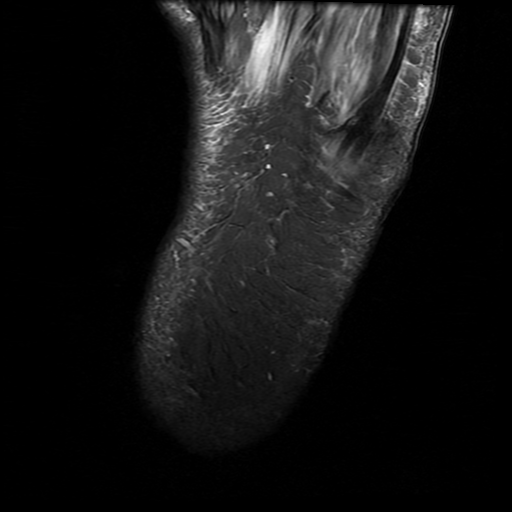
[im 22/43]
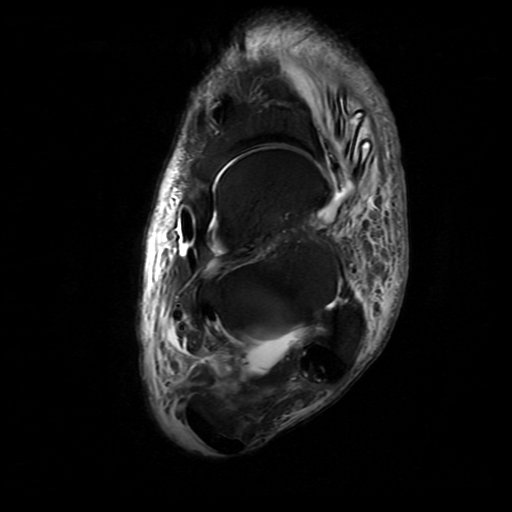
[im 37/43]
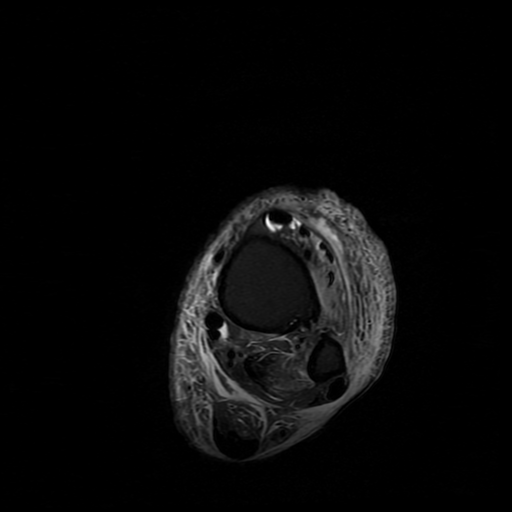

[Series 7: PD fat-sat · axial · 3.0mm · 0.33mm/px · z∈[-78,+60]mm · 8 of 43 slices shown]
[im 1/43]
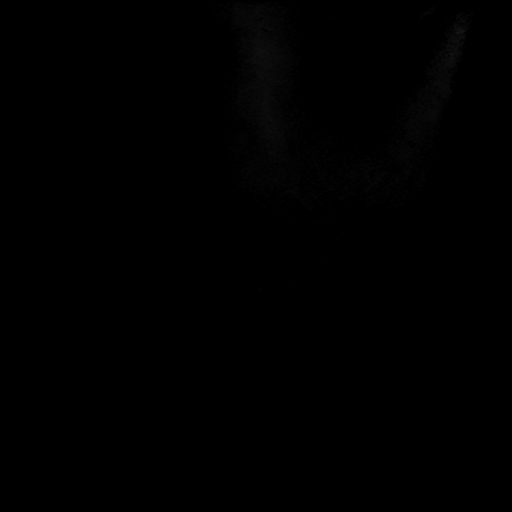
[im 6/43]
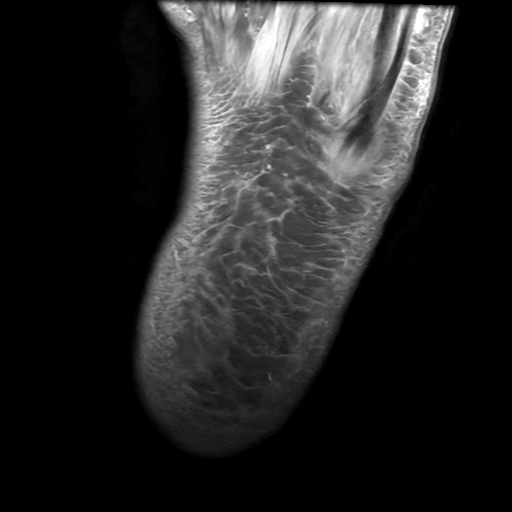
[im 11/43]
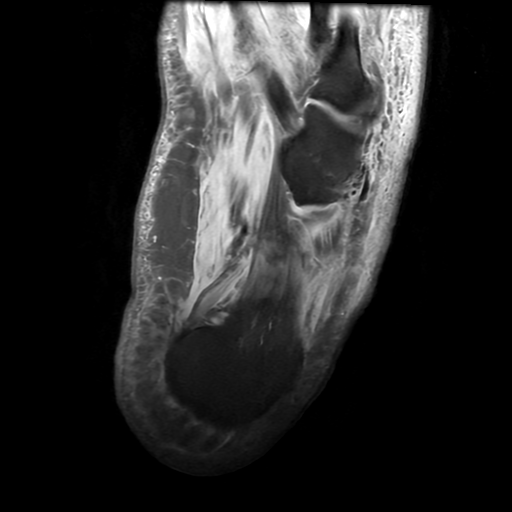
[im 16/43]
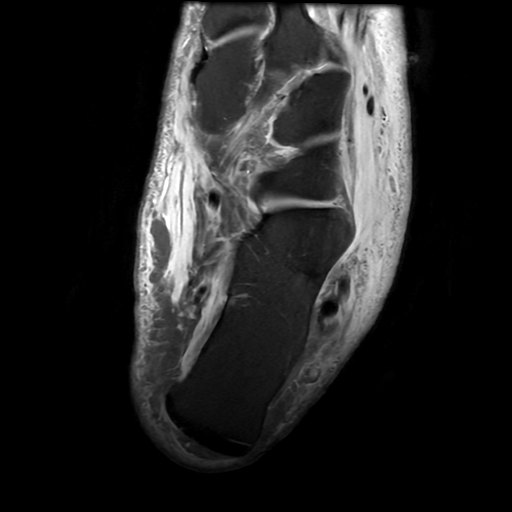
[im 22/43]
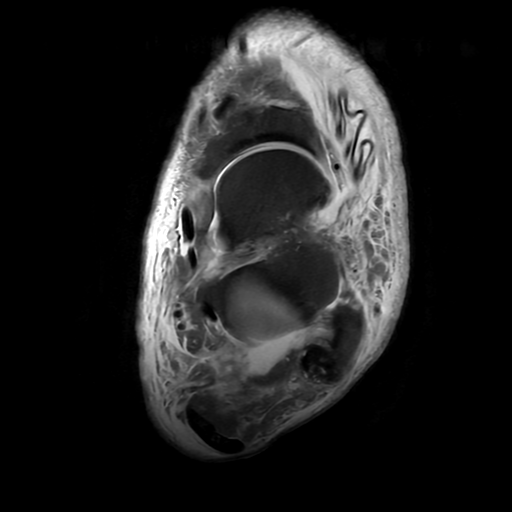
[im 27/43]
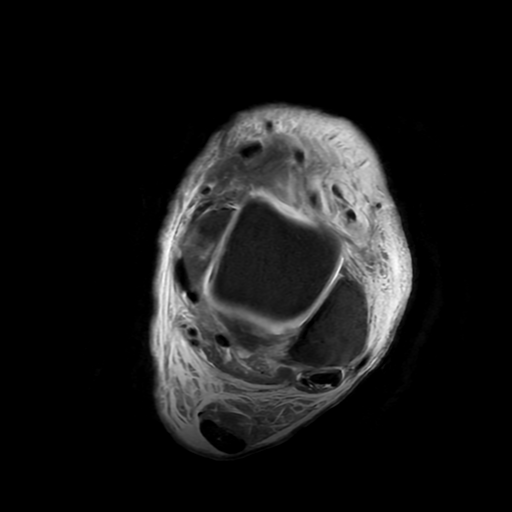
[im 32/43]
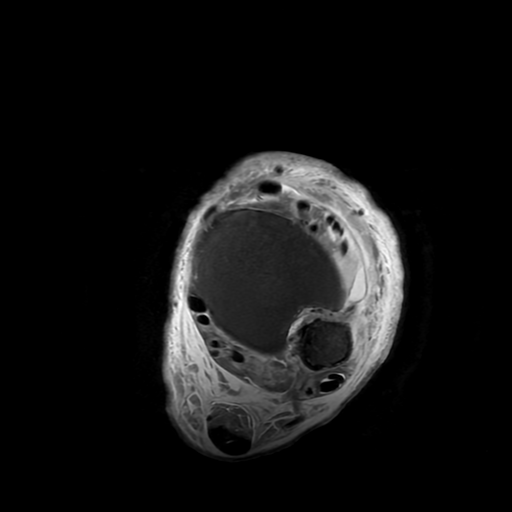
[im 37/43]
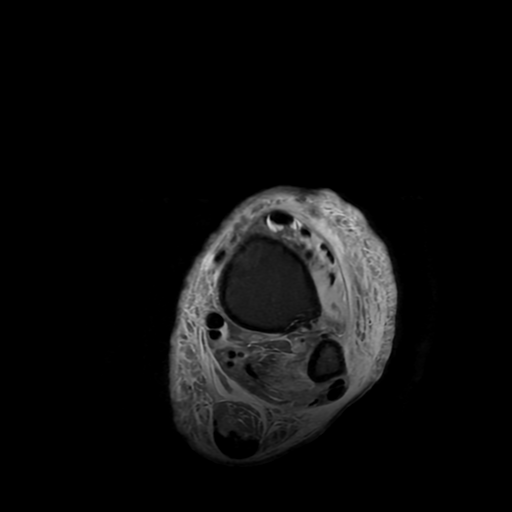

[Series 8: T2 fat-sat · coronal · 3.0mm · 0.31mm/px · 3 of 56 slices shown (2 of 2)]
[im 11/56]
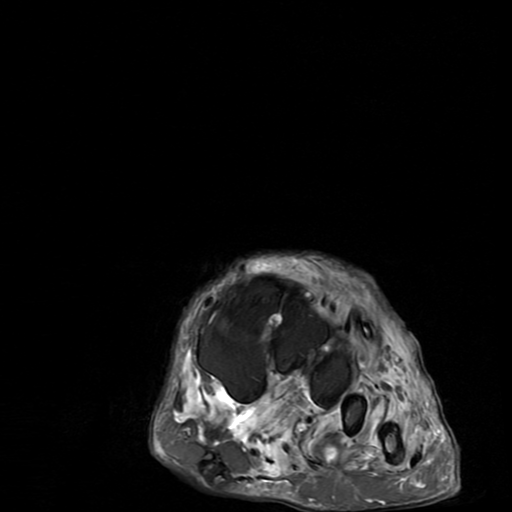
[im 31/56]
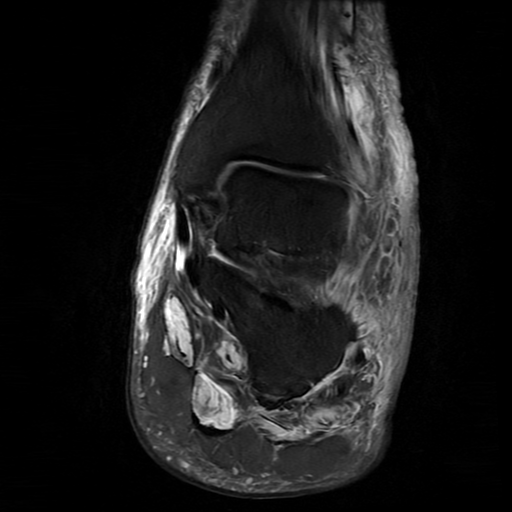
[im 51/56]
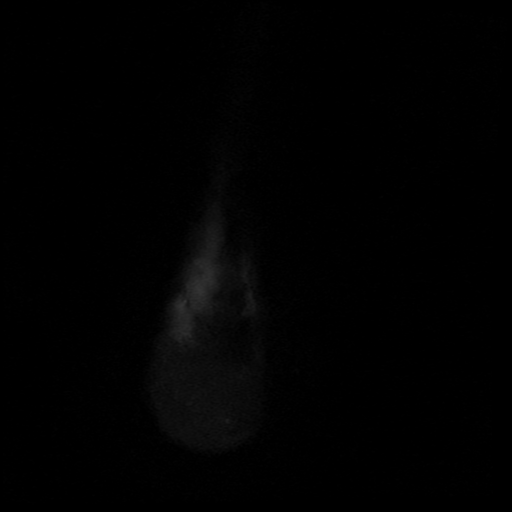

[17 of 40 positions shown; findings below may reference images not displayed]

FINDINGS: TENDONS

Peroneal: Mild tenosynovitis of the peroneal longus and brevis
tendons above and below the lateral malleolus. There is an accessory
peroneal tendon inserting on the calcaneus consistent with a
peroneus quartus.

Posteromedial: Mild posterior tibial tendon tenosynovitis above and
below the medial malleolus, worst distally. Flexor digitorum longus
tendon intact. Flexor hallucis longus tendon intact.

Anterior: Focal tenosynovitis of the tibialis anterior tendon and
extensor digitorum tendon at the level of the ankle. There is the
appearance of packing material/postsurgical change along the dorsal
foot/anterior ankle extending along the extensor digitorum tendon.
Extensor hallucis longus tendon intact Extensor digitorum longus
tendon intact.

Achilles:  Intact.

Plantar Fascia: Intact.

LIGAMENTS

Lateral: Anterior talofibular ligament intact. Calcaneofibular
ligament intact. Posterior talofibular ligament intact. Anterior and
posterior tibiofibular ligaments intact.

Medial: Deltoid ligament intact. Spring ligament intact.

CARTILAGE

Ankle Joint: Small tibiotalar joint effusion. Normal ankle mortise.
No chondral defect.

Subtalar Joints/Sinus Tarsi: Normal subtalar joints. No subtalar
joint effusion. Normal sinus tarsi.

Bones: Focal periarticular bone marrow edema at the fifth digit
metatarsophalangeal joint, only seen on series 8, images 50-56).
This is adjacent to the dorsal soft tissue wound. There is a small
joint effusion of the fifth MTP joint. There is minimal edema in the
distal fourth metatarsal, partially visualized and possibly
reactive. There is no other marrow signal abnormality in the
hindfoot, distal tibial or fibula.

Soft Tissue: Recent dorsal approach incision and
drainage/debridement along the partially visualized forefoot, with
appearance of surgical packing material extending along the course
of the extensor digitorum tendon. Extensive foot and ankle soft
tissue swelling. There are small pockets of fluid adjacent to the
IMPRESSION: Recent incision and debridement/drainage along the dorsolateral
forefoot, with appearance of surgical packing material extending
along the course of the extensor digitorum tendon, correlate with
operative details. Periarticular bone marrow edema at the fifth
metatarsophalangeal joint with small joint effusion, concerning for
septic arthritis and osteomyelitis. There are adjacent small pockets
of fluid which could be small abscesses.

Additional bony edema in the distal fourth metatarsal, to a lesser
degree than the fifth, partially visualized and may be reactive or
early osteomyelitis.

Focal tenosynovitis of the tibialis anterior tendon and extensor
digitorum tendon at the level of the ankle.

Mild tenosynovitis of the peroneal longus and brevis tendons and
posterior tibial tendon above and below the lateral and medial
malleoli.

Extensive soft tissue swelling of the foot and ankle with
intramuscular edema which could represent myositis.

## 2021-02-25 IMAGING — MR MR [PERSON_NAME] LOW W/O CM*L*
4 of 5 series · 11 of 40 positions shown · non-contrast
Comparison: Radiograph [DATE]

CLINICAL DATA: Soft tissue infection suspected, lower leg, xray
done concern for necrotizing fasciitis

EXAM:
MRI OF LOWER LEFT EXTREMITY WITHOUT CONTRAST
TECHNIQUE: Multiplanar, multisequence MR imaging of the left lower extremity
was performed. No intravenous contrast was administered.

[Series 3: STIR · sagittal · 4.0mm · 0.78mm/px · 2 of 30 slices shown]
[im 1/30]
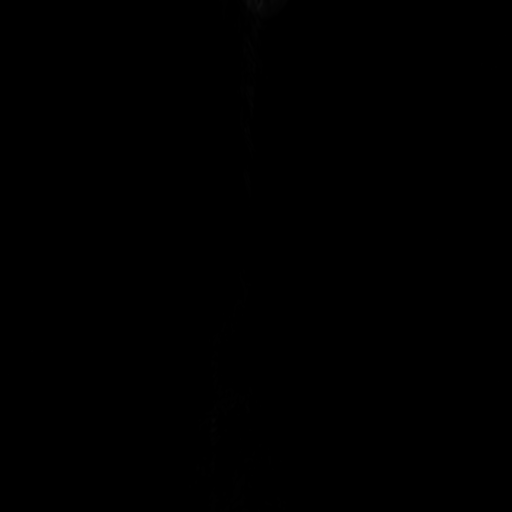
[im 20/30]
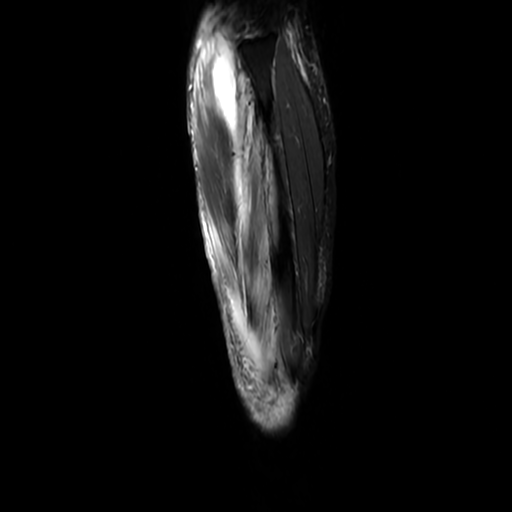

[Series 4: T1 · axial · 4.0mm · 0.35mm/px · z∈[-85,+195]mm · 3 of 84 slices shown (1 of 2)]
[im 14/84]
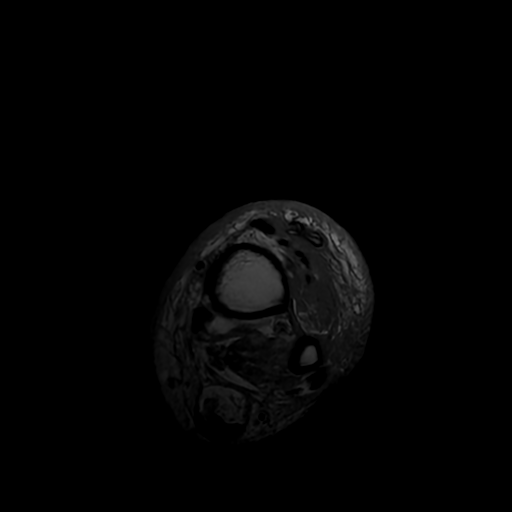
[im 42/84]
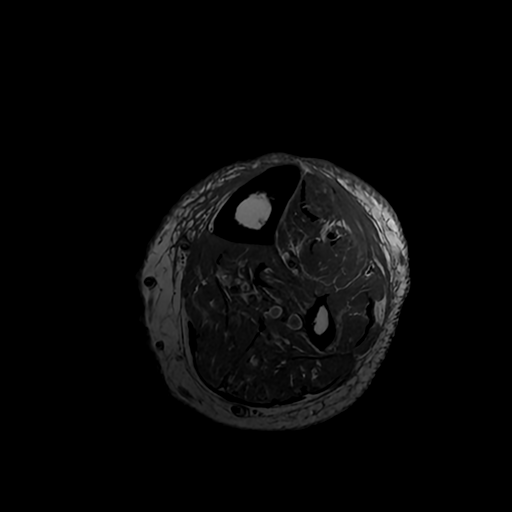
[im 70/84]
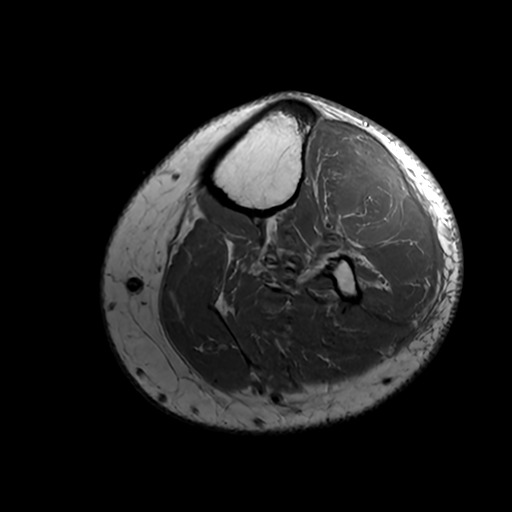

[Series 6: T2 fat-sat · axial · 4.0mm · 0.35mm/px · z∈[-85,+195]mm · 3 of 84 slices shown]
[im 14/84]
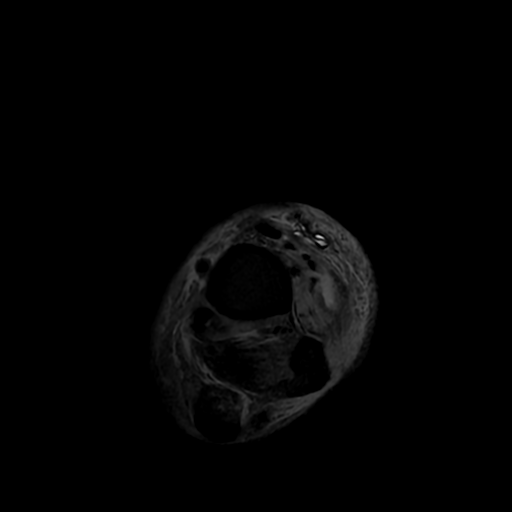
[im 42/84]
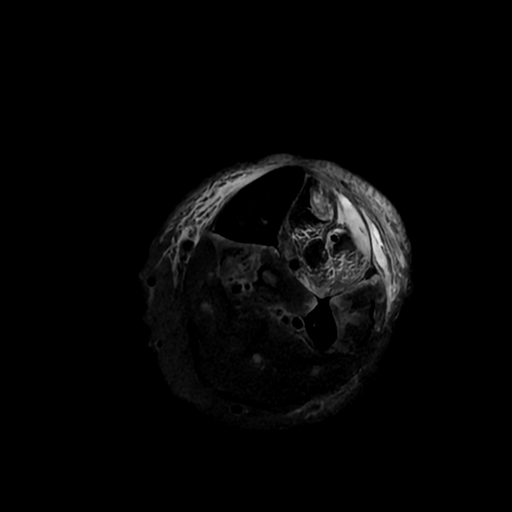
[im 70/84]
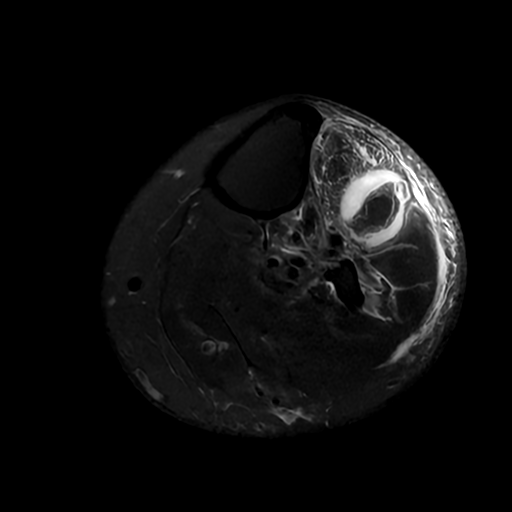

[Series 7: T1 · coronal · 4.0mm · 0.41mm/px · 3 of 30 slices shown (2 of 2)]
[im 1/30]
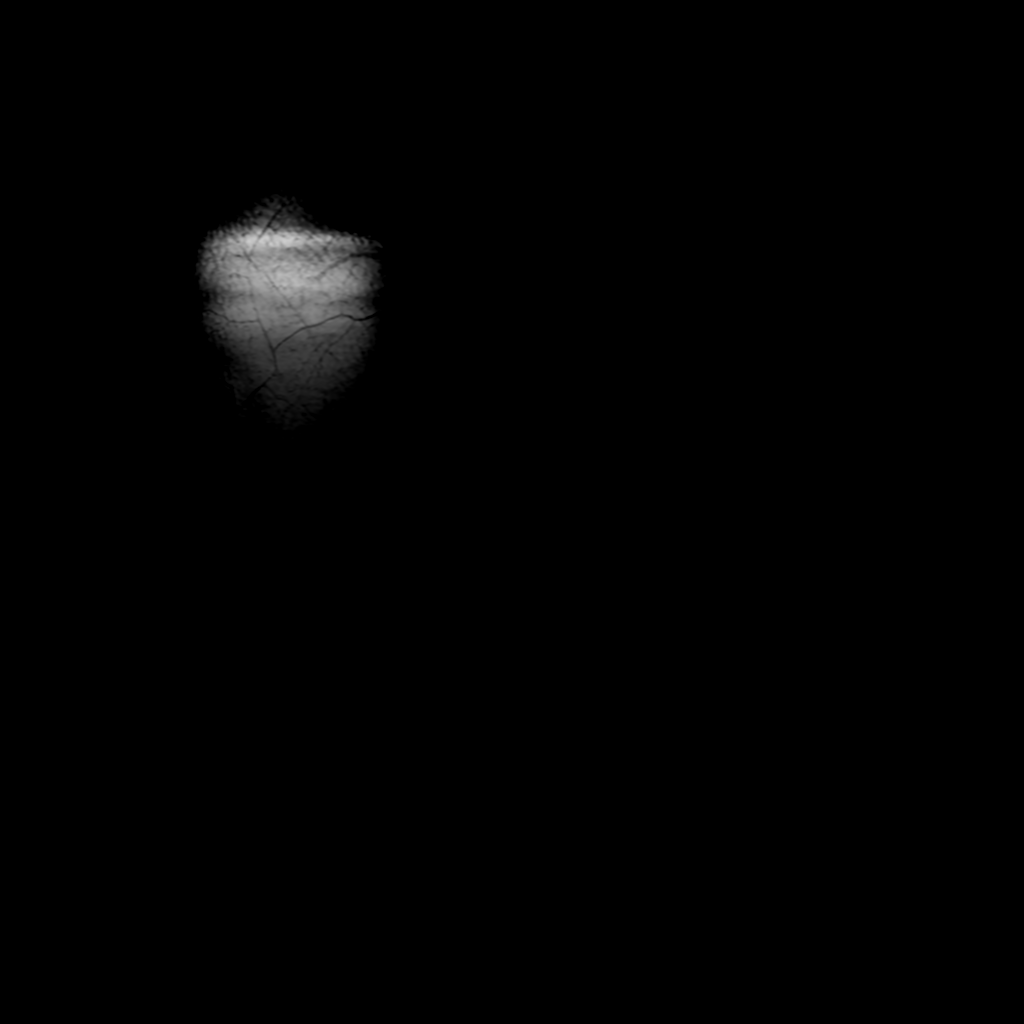
[im 15/30]
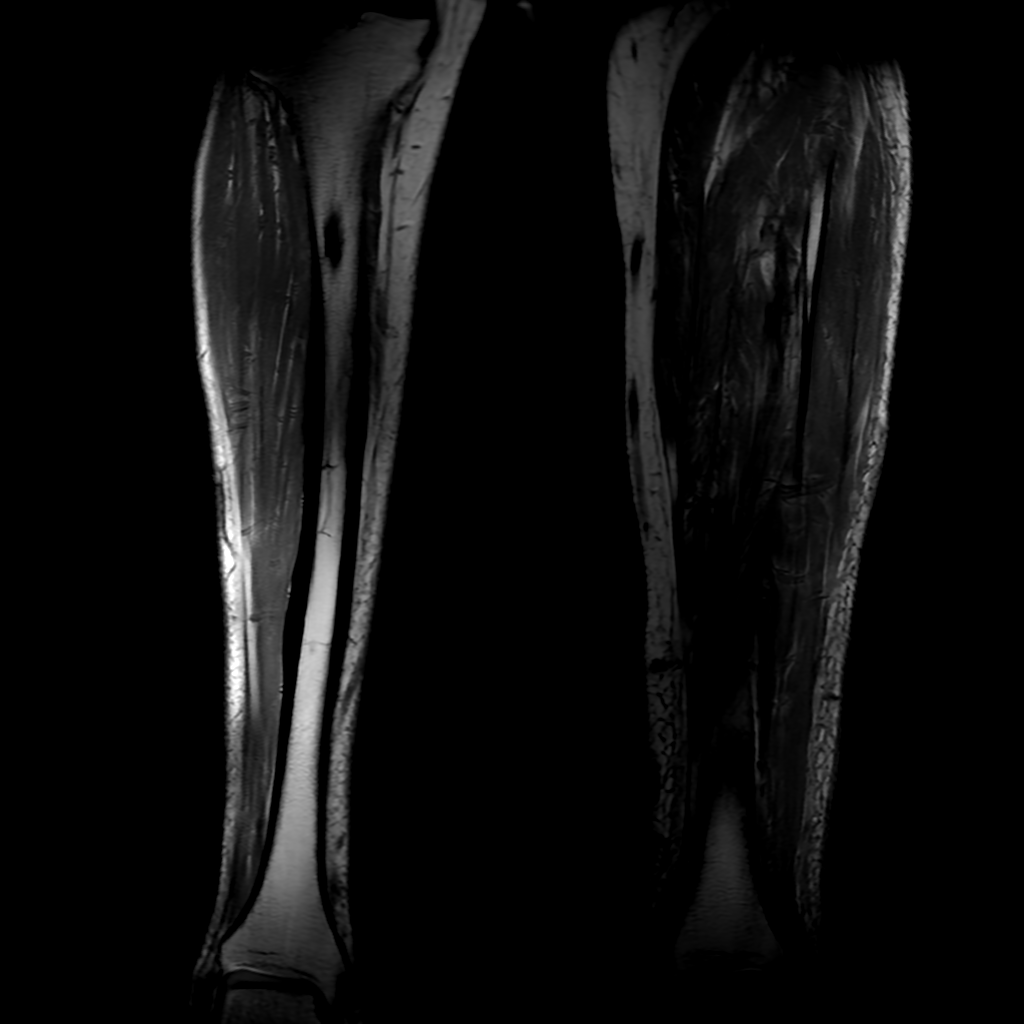
[im 30/30]
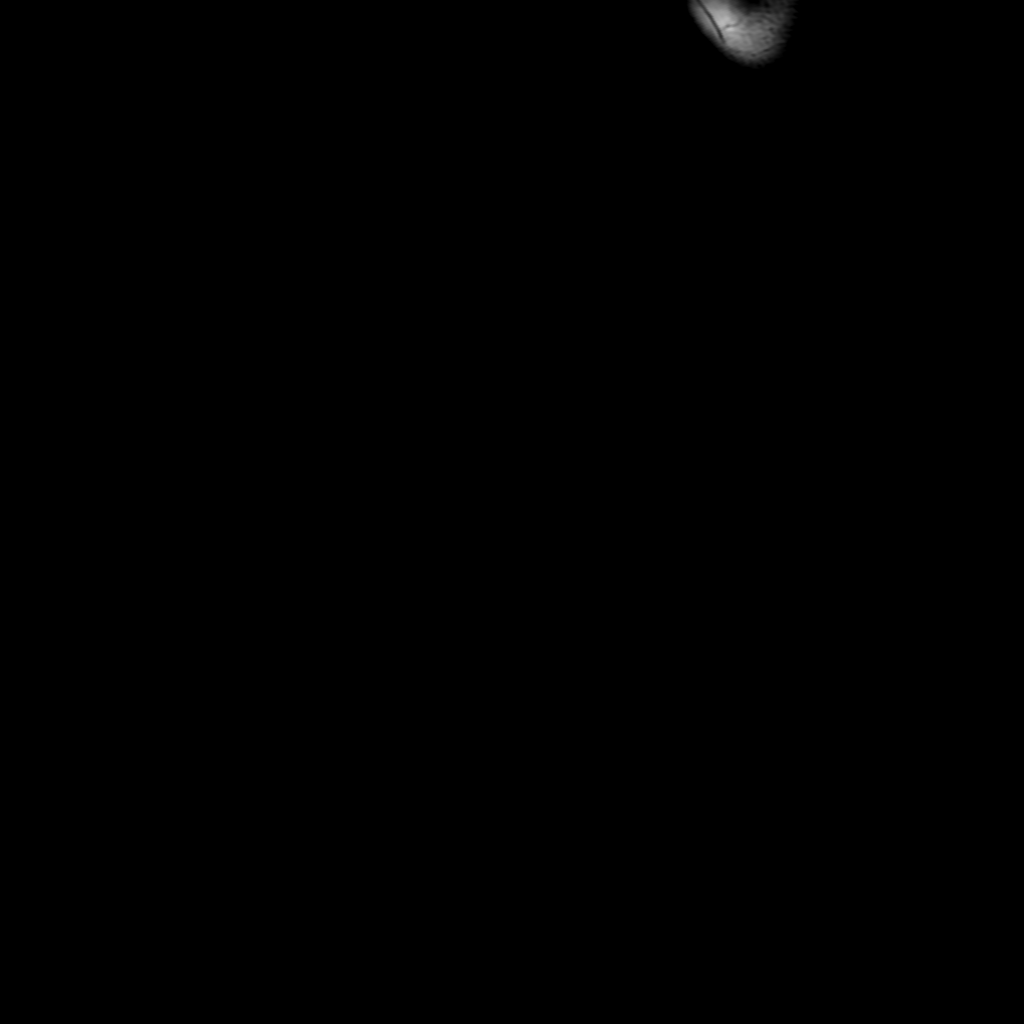

[11 of 40 positions shown; findings below may reference images not displayed]

FINDINGS: Bones/Joint/Cartilage

The cortex is intact. There is no significant marrow signal
alteration. Partially visualized chronic fracture deformity of the
right mid fibula.

Muscles and Tendons

There is extensive inflammatory change in the anterior compartment
of the lower leg, with intramuscular edema and marked thickening of
the intermuscular fascia with high-signal intensity fluid primarily
along the extensor digitorum muscle. There is also intramuscular
edema to a lesser degree in the lateral and the posterior
compartments without fascial thickening or focal fluid collection.

Soft tissues

Extensive subcutaneous soft tissue swelling. Recent anterior
incision above the ankle with appearance of packing material.
IMPRESSION: Extensive inflammatory changes within the anterior compartment of
the lower leg, with myositis and marked thickening of the
intermuscular fascia with high-signal intensity fluid primarily
along the extensor digitorum muscle, concerning for infectious
myositis and fasciitis.

Appearance of surgical packing material noted along in anterior
incision just above the ankle, correlate with operative details.

Additional intramuscular edema to a lesser degree in the lateral and
deep posterior compartments which is likely reactive.

No evidence of osteomyelitis.

These results were called by telephone at the time of interpretation
on [DATE] at [DATE] to provider Dr. LESHENI, who verbally
acknowledged these results.

## 2021-02-25 SURGERY — CORONARY ARTERY BYPASS GRAFTING (CABG)
Anesthesia: General | Site: Chest

## 2021-02-25 MED ORDER — ATORVASTATIN CALCIUM 80 MG PO TABS
80.0000 mg | ORAL_TABLET | Freq: Every day | ORAL | Status: DC
  Administered 2021-02-25 – 2021-03-03 (×7): 80 mg via ORAL
  Filled 2021-02-25 (×7): qty 1

## 2021-02-25 MED ORDER — ASPIRIN 81 MG PO CHEW
81.0000 mg | CHEWABLE_TABLET | Freq: Every day | ORAL | Status: DC
  Administered 2021-02-25: 81 mg via ORAL
  Filled 2021-02-25 (×2): qty 1

## 2021-02-25 MED ORDER — ALUM & MAG HYDROXIDE-SIMETH 200-200-20 MG/5ML PO SUSP
15.0000 mL | ORAL | Status: DC | PRN
  Administered 2021-03-04 – 2021-03-08 (×4): 15 mL via ORAL
  Filled 2021-02-25 (×4): qty 30

## 2021-02-25 MED ORDER — VANCOMYCIN HCL 1500 MG/300ML IV SOLN: 1500.0000 mg | INTRAVENOUS | Status: DC

## 2021-02-25 MED ORDER — HYDROMORPHONE HCL 2 MG PO TABS
4.0000 mg | ORAL_TABLET | ORAL | Status: DC | PRN
Start: 2021-02-25 — End: 2021-03-15
  Administered 2021-02-25 – 2021-03-14 (×37): 4 mg via ORAL
  Filled 2021-02-25 (×38): qty 2

## 2021-02-25 MED ORDER — ACETAMINOPHEN 325 MG PO TABS
650.0000 mg | ORAL_TABLET | Freq: Four times a day (QID) | ORAL | Status: DC | PRN
  Administered 2021-02-25: 650 mg via ORAL
  Filled 2021-02-25: qty 2

## 2021-02-25 MED ORDER — INSULIN ASPART 100 UNIT/ML IJ SOLN
0.0000 [IU] | Freq: Three times a day (TID) | INTRAMUSCULAR | Status: DC
  Administered 2021-02-25: 2 [IU] via SUBCUTANEOUS
  Administered 2021-02-25: 1 [IU] via SUBCUTANEOUS
  Administered 2021-02-25: 2 [IU] via SUBCUTANEOUS
  Administered 2021-02-26: 5 [IU] via SUBCUTANEOUS
  Administered 2021-02-26 (×2): 2 [IU] via SUBCUTANEOUS
  Administered 2021-02-27: 3 [IU] via SUBCUTANEOUS
  Administered 2021-02-27 (×2): 2 [IU] via SUBCUTANEOUS
  Administered 2021-02-28: 3 [IU] via SUBCUTANEOUS
  Administered 2021-02-28: 5 [IU] via SUBCUTANEOUS
  Administered 2021-02-28: 3 [IU] via SUBCUTANEOUS
  Administered 2021-03-01 (×2): 5 [IU] via SUBCUTANEOUS
  Administered 2021-03-01 – 2021-03-02 (×2): 2 [IU] via SUBCUTANEOUS
  Administered 2021-03-02: 3 [IU] via SUBCUTANEOUS
  Administered 2021-03-02: 2 [IU] via SUBCUTANEOUS
  Administered 2021-03-03: 3 [IU] via SUBCUTANEOUS
  Administered 2021-03-03 – 2021-03-04 (×2): 2 [IU] via SUBCUTANEOUS
  Administered 2021-03-04: 1 [IU] via SUBCUTANEOUS
  Administered 2021-03-04 – 2021-03-05 (×3): 2 [IU] via SUBCUTANEOUS
  Administered 2021-03-05: 1 [IU] via SUBCUTANEOUS
  Administered 2021-03-06: 2 [IU] via SUBCUTANEOUS
  Administered 2021-03-06: 1 [IU] via SUBCUTANEOUS
  Administered 2021-03-06 – 2021-03-12 (×12): 2 [IU] via SUBCUTANEOUS
  Administered 2021-03-13: 1 [IU] via SUBCUTANEOUS
  Administered 2021-03-13: 2 [IU] via SUBCUTANEOUS
  Administered 2021-03-14: 3 [IU] via SUBCUTANEOUS
  Administered 2021-03-14: 2 [IU] via SUBCUTANEOUS
  Administered 2021-03-14: 5 [IU] via SUBCUTANEOUS
  Administered 2021-03-15: 2 [IU] via SUBCUTANEOUS

## 2021-02-25 MED ORDER — RIVAROXABAN 10 MG PO TABS
10.0000 mg | ORAL_TABLET | Freq: Every day | ORAL | Status: DC
  Administered 2021-02-25: 10 mg via ORAL
  Filled 2021-02-25 (×3): qty 1

## 2021-02-25 MED ORDER — INSULIN GLARGINE-YFGN 100 UNIT/ML ~~LOC~~ SOLN
10.0000 [IU] | Freq: Every day | SUBCUTANEOUS | Status: DC
  Administered 2021-02-25: 10 [IU] via SUBCUTANEOUS
  Filled 2021-02-25 (×2): qty 0.1

## 2021-02-25 MED ORDER — ACETAMINOPHEN 10 MG/ML IV SOLN
INTRAVENOUS | Status: AC
  Filled 2021-02-25: qty 100

## 2021-02-25 MED ORDER — FENTANYL CITRATE (PF) 100 MCG/2ML IJ SOLN: 25.0000 ug | INTRAMUSCULAR | Status: DC | PRN

## 2021-02-25 MED ORDER — VANCOMYCIN HCL 1250 MG/250ML IV SOLN
1250.0000 mg | INTRAVENOUS | Status: DC
  Administered 2021-02-25 – 2021-02-28 (×4): 1250 mg via INTRAVENOUS
  Filled 2021-02-25 (×5): qty 250

## 2021-02-25 MED ORDER — INSULIN ASPART 100 UNIT/ML IJ SOLN
10.0000 [IU] | Freq: Once | INTRAMUSCULAR | Status: AC
  Administered 2021-02-25: 10 [IU] via INTRAVENOUS

## 2021-02-25 MED ORDER — FENTANYL CITRATE (PF) 100 MCG/2ML IJ SOLN
25.0000 ug | INTRAMUSCULAR | Status: DC | PRN
  Administered 2021-02-25 – 2021-02-26 (×2): 50 ug via INTRAVENOUS
  Administered 2021-03-03 (×2): 25 ug via INTRAVENOUS
  Administered 2021-03-04 (×2): 50 ug via INTRAVENOUS
  Filled 2021-02-25 (×6): qty 2

## 2021-02-25 MED ORDER — SODIUM CHLORIDE 0.9 % IV SOLN
2.0000 g | Freq: Three times a day (TID) | INTRAVENOUS | Status: DC
  Administered 2021-02-25 – 2021-02-27 (×6): 2 g via INTRAVENOUS
  Filled 2021-02-25 (×7): qty 2

## 2021-02-25 MED ORDER — SENNA 8.6 MG PO TABS: 2.0000 | ORAL_TABLET | Freq: Every day | ORAL | Status: DC | PRN

## 2021-02-25 MED ORDER — PROMETHAZINE HCL 25 MG/ML IJ SOLN: 6.2500 mg | INTRAMUSCULAR | Status: DC | PRN

## 2021-02-25 MED ORDER — POLYETHYLENE GLYCOL 3350 17 G PO PACK: 17.0000 g | PACK | Freq: Every day | ORAL | Status: DC | PRN

## 2021-02-25 MED ORDER — PIPERACILLIN-TAZOBACTAM 3.375 G IVPB
3.3750 g | Freq: Three times a day (TID) | INTRAVENOUS | Status: DC
  Administered 2021-02-25: 3.375 g via INTRAVENOUS
  Filled 2021-02-25: qty 50

## 2021-02-25 MED ORDER — ACETAMINOPHEN 10 MG/ML IV SOLN
1000.0000 mg | Freq: Once | INTRAVENOUS | Status: DC | PRN
  Administered 2021-02-25: 1000 mg via INTRAVENOUS

## 2021-02-25 NOTE — Social Work (Addendum)
Outpatient LCSW reached out to inpatient [facility] team member Letha Cape, [facility], with the following updates: - Pt sees [facility] providers at [facility]  - He lives with his daughter [place] per our last conversation, he is currently not employed due to ongoing health challenges.   - He has been enrolled in [facility] and can call [phone] to get to any [facility] appointments as needed.   - He had been provided with both [facility] clinics list and [facility] clinic information to establish for primary care.   - He was approved for medication assistance through [facility] mail order pharmacy program for the following medications:  amlodipine, atorvastatin, aspirin and furosemide. He was encouraged to utilize [facility] to fill the rest of his medications.  - He has disability pending, I reached out to [employer] to see if they plan to reapply for Medicaid on pt behalf and they state that he is on their queue for tomorrow. Pt had started [employer] but application was missing several documents (an update of what was missing had been mailed to pt last week). If he does not have Medicaid application pending he can continue to apply for [employer]. Will need all missing documents to submit.   I remain available for outpatient needs as they arise.   Octavio Graves, MSW, LCSW [facility] Heart/Vascular Care Navigation  [phone]

## 2021-02-25 NOTE — Progress Notes (Addendum)
HD#1 SUBJECTIVE:  Patient Summary: Adam James is a 50 y.o. with a pertinent PMH of diabetes, ischemic heart disease, chronic heart failure with reduced EF, and CKD IIIa who presented s/p irrigation and debridement for left foot diabetic infection with gas gangrene.  Overnight Events: No acute events overnight reported.  Interim History: Adam James was seen and examined at the bedside this morning. He is endorsing pain in his left foot that is not currently controlled by pain meds. The patient states that he first noticed the wound on his left foot in July and notes that the wound on his right foot started in February. The wound on the left has had more skin breakdown and overall has been more severe in nature. Patient notes that prior to his surgery, it hurt to bear weight on his left side for few days. He does not typically follow with a pcp and takes care of wounds on his own, although he is trying to establish with a physician once he is able to secure disability insurance.   OBJECTIVE:  Vital Signs: Vitals:   02/25/21 0123 02/25/21 0130 02/25/21 0311 02/25/21 0727  BP: 101/72  114/64 97/73  Pulse: 98  87 96  Resp: 19  20 19   Temp: 98 F (36.7 C)  98.4 F (36.9 C) 99 F (37.2 C)  TempSrc: Oral  Oral Oral  SpO2: 100%  98% 96%  Weight:  109 kg    Height:  6\' 3"  (1.905 m)     Supplemental O2: Room Air SpO2: 96 %  Filed Weights   02/25/21 0130  Weight: 109 kg     Intake/Output Summary (Last 24 hours) at 02/25/2021 1042 Last data filed at 02/25/2021 0749 Gross per 24 hour  Intake 1130 ml  Output --  Net 1130 ml   Net IO Since Admission: 1,130 mL [02/25/21 1042]  Physical Exam: General: Pleasant, well-appearing male laying in bed. No acute distress but appears to be in pain. CV: RRR. No murmurs, rubs, or gallops. No LE edema Pulmonary: Lungs CTAB. Normal effort. No wheezing or rales. Extremities: Palpable radial and DP pulses on the right. 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 2cm ulcer on plantar aspect of R foot under the base of the 4th-5th metatarsal, surrounding skin is necrotic. Unable to assess LLE due to post-op dressing. Skin: Warm and dry. No obvious rash or lesions. Neuro: A&Ox3. Reduced sensation in lower extremities. No focal deficit. Psych: Normal mood and affect   Patient Lines/Drains/Airways Status     Active Line/Drains/Airways     Name Placement date Placement time Site Days   Peripheral IV 02/24/21 20 G Right Antecubital 02/24/21  1707  Antecubital  1   Peripheral IV 02/24/21 20 G Left Antecubital 02/24/21  1744  Antecubital  1   Open Drain 2 Left;Anterior Foot 0.1 Fr. 02/24/21  2325  Foot  1   Incision (Closed) 02/24/21 Leg Left 02/24/21  2326  -- 1   Wound / Incision (Open or Dehisced) 02/25/21 Diabetic ulcer Foot Right 02/25/21  0130  Foot  less than 1   Wound / Incision (Open or Dehisced) 02/25/21 Diabetic ulcer Foot Right 02/25/21  0130  Foot  less than 1            Pertinent Labs: CBC Latest Ref Rng & Units 02/25/2021 02/24/2021 02/21/2021  WBC 4.0 - 10.5 K/uL 21.2(H) 25.5(H) 25.2(H)  Hemoglobin 13.0 - 17.0 g/dL 04/26/2021) 10.1(L) 10.4(L)  Hematocrit 39.0 - 52.0 % 26.9(L) 30.4(L) 31.0(L)  Platelets 150 - 400 K/uL 303 396 382    CMP Latest Ref Rng & Units 02/25/2021 02/24/2021 02/21/2021  Glucose 70 - 99 mg/dL 557(D) 220(U) 542(H)  BUN 6 - 20 mg/dL 06(C) 37(S) 18  Creatinine 0.61 - 1.24 mg/dL 2.83(T) 5.17(O) 1.60(V)  Sodium 135 - 145 mmol/L 131(L) 130(L) 132(L)  Potassium 3.5 - 5.1 mmol/L 3.8 4.1 4.2  Chloride 98 - 111 mmol/L 97(L) 96(L) 105  CO2 22 - 32 mmol/L 21(L) 21(L) 20(L)  Calcium 8.9 - 10.3 mg/dL 8.2(L) 8.4(L) 8.1(L)  Total Protein 6.5 - 8.1 g/dL - 6.9 3.7(T)  Total Bilirubin 0.3 - 1.2 mg/dL - 0.7 0.3  Alkaline Phos 38 - 126 U/L - 97 74  AST 15 - 41 U/L - 20 11(L)  ALT 0 - 44 U/L - 13 10    Recent Labs    02/25/21 0112 02/25/21 0308 02/25/21 0639  GLUCAP 173* 105* 127*      ASSESSMENT/PLAN:   Assessment: Principal Problem:   Gangrene of left foot (HCC) Active Problems:   Cardiomyopathy (HCC)   Type 2 diabetes mellitus (HCC)   Diabetic foot ulcer (HCC)   CAD (coronary artery disease)   Plan: #Left sided diabetic foot infection with gas gangrene Patient presented with a 2cm plantar ulcer at the base of his 5th metatarsal. He first noted the wound in July and states that it progressed to where it caused him pain to bear weight on it the last few days. L foot xray showed diffuse soft tissue edema with subcutaneous soft tissue gas loculations about the dorsum of the foot, predominantly overlying the distal 4th/5th metatarsals without evidence of osteomyelitis. Patient underwent debridement of the ulcer overnight with orthopedics and was started on vanc/zosyn. Blood cultures initially ordered and grew staph epi in 2/4 bottles, likely a contaminant and this does not warrant any additional antibiotics.  - Switched antibiotics to vanc/cefepime in the setting of acute on chronic renal failure - MRI ordered to further evaluate for osteomyelitis  - Fungal, anaerobic, and aerobic wound cultures still pending - PO dilaudid 4mg  q4h as needed for pain management   #Right sided diabetic foot ulcer Patient states that he first noticed the wound on his right foot in February and has been trying to take care of it himself since then. He does not endorse any pain in this foot, however, he does have reduced sensation because of his diabetic neuropathy. Pulses are palpable and the wound does not appear to be actively infected at this time.  - Wound care consulted, appreciate recommendations - Will keep wound covered with Xeroform gauze and change BID  #Type 2 Diabetes with diabetic neuropathy HbA1c 8.8%. At home the patient is on metformin 500mg  BID and glyburide 5mg  BID. He was started on sliding scale insulin upon admission. His blood sugar goal is <180 to help promote wound healing. Patient does  endorse numbness/tingling in his feet.  - Started 10 units Lantus qhs with SSI  - CBG monitoring 4 times daily, before meals, and at bedtime   #Chronic HFrEF #Ischemic heart disease Echo on 02/20/21 showed EF of 30-35%, LV regional wall motion abnormalities with concentric hypertrophy, grade I diastolic dysfunction, and aortic dilatation. Patient denies any chest pain, SOB, orthopnea, or lower extremity edema. BNP 100 and patient appears euvolemic upon examination. Patient was supposed to have a CABG today, however, this is on hold until his foot infection resolves. EKG upon admission showed no signs of active ischemia. - Holding home lasix  due to euvolemic state  - Holding home metoprolol, irbesartan, and amlodipine due to hypotension upon admission - Continue aspirin and atorvastatin   #Acute on chronic renal failure Patient has CKD IIIa at his baseline and was noted to have worsening of his kidney function likely in the setting of dehydration due to reduced oral intake. UA without any signs of infection, there are hyaline casts. He was given IV fluids and renal function has improved, BUN/Cr 32/2.16 this morning. Patient was initially given vanc/zosyn post-operatively, however, we have discontinued zosyn due to risk of nephrotoxicity with vanc.  - Continue to monitor renal function - Avoid nephrotoxic agents   Best Practice: Diet: Cardiac diet IVF: Fluids: none VTE: rivaroxaban (XARELTO) tablet 10 mg Code: Full AB: Vanc/Cefepime Therapy Recs: PT/OT eval  DISPO: Anticipated discharge in 2-3 days to Home pending  clinical improvement .  Signature: Elza Rafter, D.O.  Internal Medicine Resident, PGY-1 [facility]  Pager: [phone] 10:42 AM, 02/25/2021   Please contact the on call pager after 5 pm and on weekends at [phone].

## 2021-02-25 NOTE — Consult Note (Signed)
WOC Nurse Consult Note: Patient receiving care in [facility] 3E01 Patient has much concern about being disabled and indigent and not being able to get the care that he needs. States that he is trying to get disability and hopefully Medicaid or Medicare but is having a lot of difficultly getting this accomplished. He feels that no one is taking him seriously about the wound on his right foot and doesn't want it to get infected like the left one and loose his foot or have to have surgery on it.  Reason for Consult: Right foot wound Wound type: Callous on the right great toe Calloused wound on the base of the little toe of the right foot that measures 3 cm x 2 cm x 0.5 cm depth of the small hole that is in the center of the wound. This hole measures 0.4 cm x 0.4 cm x 0.5 cm and had serous drainage on the tip of the cotton swab This has potential to evolve into a much larger wound.  Pressure Injury POA: NA Periwound: Intact Dressing procedure/placement/frequency: Clean the right foot thoroughly with soap and water, rinse and pat dry. Paint the wound at the base of the little toe with Betadine, allow to air dry, then place a piece of Xeroform gauze over this and secure with foam dressing. Apply twice daily.  Monitor the wound area(s) for worsening of condition such as: Signs/symptoms of infection, increase in size, development of or worsening of odor, development of pain, or increased pain at the affected locations.   Notify the medical team if any of these develop.  Thank you for the consult. WOC nurse will not follow at this time.   Please re-consult the WOC team if needed.  Renaldo Reel Katrinka Blazing, MSN, RN, CMSRN, Angus Seller, [facility] Wound Treatment Associate Pager [phone]

## 2021-02-25 NOTE — Hospital Course (Addendum)
Adam James is a 50 y.o. 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 He first noted the wound in July and states that it progressed to where it caused him pain to bear weight on it the last few days. L foot xray showed diffuse soft tissue edema with subcutaneous soft tissue gas loculations about the dorsum of the foot, predominantly overlying the distal 4th/5th metatarsals without evidence of osteomyelitis. Patient underwent debridement of the ulcer on 8/1 with orthopedics and was started on vanc/zosyn, however, this was switched to vanc/cefepime. Blood cultures initially ordered and grew staph epi in 2/4 bottles, likely a contaminant and this does not warrant any additional abx per pharmacy.  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 Patient underwent more extensive debridement and exploration of the anterior compartment on 8/3. In the OR, purulence was noted from the metatarsal heads to the proximal aspect of the anterior compartment with extensive necrotic fascia that was excised. The muscle in the anterior and lateral compartment was also necrotic and was pulled out as well, all consistent with MRI findings. A wound vac was also placed and continued to have serosanguinous output on 8/4 and 8/5. ID was consulted on 8/4 and switched his antibiotics from vanc/cefepime to vanc/unasyn. A prevalon boot was also placed to reduce the risk of ankle  contractures. Leukocytosis continued to downtrend during the course of the admission. Patient will return to the OR on 8/8 with ortho. Ortho reported that there was still devitalized tissue, which included skin, subcutaneous tissue, and muscle. Debridement was performed and there was still viable tissue remaining. Lateral compartment of the leg demonstrated a red, dusky, muscle. Returned to the OR later on 8/12 for a repeat washout. Patient had further necrosis of the remaining muscle of the anterior lateral compartment, which was resected. Also had further resection of muscles of the medial and lateral gastrocnemius muscles. Unasyn was discontinued as the patient was experiencing a lot of nausea, which ID attributed to this antibiotic initially. Unasyn was switched to ceftriaxone and flagyl. Tunneled IJ catheter placed on 8/15 for long term antibiotics. Unasyn re-initiated on 8/18 and ceftriaxone/flagyl were discontinued per ID. Patient to be given a dose of oritavancin prior to discharge and will be set up with antibiotics per ID.  Patient will need 6 weeks of antibiotics from the date of his last surgery which was 8/12, thus making his antibiotic end date 9/23. Patient was advised to stay in the hospital for another day or two to get his IV antibiotics set up as an outpatient, however, he would like to be discharged. ID had an extensive conversation with the patient and will work with him to have an oral antibiotic regimen consisting of augmentin and doxycycline, which will finish on 9/23, although IV antibiotics are preferred. Patient will also need his tunneled cath removed as an outpatient, as this could not be done on the day of discharge.    #Right sided diabetic foot ulcer Patient states that he first  noticed the wound on his right foot in February and has been trying to take care of it himself since then. He does not endorse any pain in this foot, however, he does have reduced sensation because of his  diabetic neuropathy. Pulses are palpable and the wound does not appear to be actively infected at this time. Wound care was consulted and kept the wound wrapped in gauze.   #Type 2 Diabetes with diabetic neuropathy HbA1c 8.8%. At home the patient is on metformin 500mg  BID and glyburide 5mg  BID. He was started on sliding scale insulin upon admission. His blood sugar goal is <180 to help promote wound healing. Patient does endorse numbness/tingling in his feet. Added lantus 10 U qhs on 8/2 along with SSI on 8/2. Sugars were <200 however, were not quite at goal so lantus was increased to 12U on 8/3. Sugars continued to run in the 200s and thus long acting insulin was increased on 8/5 to 15U. Added 5U Novolog TID with meals on 8/6 for further mealtime coverage and increasd to 8U on 8/7. Blood sugars remained <180. Will be discharged on home regimen.  #Chronic HFrEF #Ischemic heart disease Echo on 02/20/21 showed EF of 30-35%, LV regional wall motion abnormalities with concentric hypertrophy, grade I diastolic dysfunction, and aortic dilatation. Patient denies any chest pain, SOB, orthopnea, or lower extremity edema. BNP 100 and patient appears euvolemic upon examination. Patient was supposed to have a CABG today, however, this is on hold until his foot infection resolves. EKG upon admission showed no signs of active ischemia. Continued aspirin and statin during admission. Held metoprolol, irbesartan, and amlodipine in the setting of hypotension. Metoprolol restarted at 12.5mg  on 8/4 and was increased to his home dosage of 25mg  on 8/5. Considered restarting irbesartan as his blood pressure was high, however, this was still held in the setting of worsening renal function. Will be resumed on discharge as patient's kidney function has improved.  #Acute on chronic renal failure #Acute tubular necrosis Patient has CKD IIIa at his baseline and was noted to have worsening of his kidney function likely in the setting  of dehydration due to reduced oral intake. UA without any signs of infection, however, there are hyaline casts. He was given IV fluids and renal function has improved, BUN/Cr 32/2.16 this morning. Patient was initially given vanc/zosyn post-operatively, however, we have discontinued zosyn due to its nephrotoxic effects with vanc.  urine was first reported by patient on 8/6. No dysuria to suggest UTI. UA and CK level checked to eval for pigment. Patient continues to have brown urine on 8/7. He denies dysuria or any other urinary symptoms. UA on 8/6 significant for >300 protein, >50 RBCs, no leukocyte esterase or nitrites. CK level within normal limits. Urine protein/creatinine ratio result below reportable range. Repeat UA showing no signs of infection, but granular casts were noted. IV fluids started on 8/8 for ATN and monitored patient for signs of volume overload. Creatinine continue to rise on 8/9, up to 2.88. Did not start fluids as patient's weight is up, likely from the volume of fluids he has received. Creatinine up to 3.42 on 8/10 and remained stable the next day. Improved to 2.24 on 8/16, although his BUN is rising. Rise in BUN could be secondary to possible GI bleed. BUN and creatinine improved to 8/18 to 30/1.74 respecitvely. Creatinine improved even further on the day of discharge to 1.6.   #Severe erosive esophagitis Patient started coughing up coffee ground sputum with some  bright red blood noted the morning of 8/16. Also had a Hb drop to 6.6 and was given 1 unit PRBC. This is likely secondary to a cushing ulcer secondary to stress on his body. Started on protonix 40mg  IV BID and GI consulted. Repeat H&H with Hb of 6.7, another unit PRBC was ordered, along with a unit of platelets. EGD on 8/17 morning showed severe erosive esophagitis with ulceration, with no active bleeding noted but a large clot was noted on the esophagus. Stomach and duodenum appeared normal. Started on protonix 40mg  BID  PO and carafate x2 weeks. Hb 6.9 the morning of 8/18 and he was transfused with 1 more unit of RBCs and platelets. Hb stable on the day of discharge to 8. Resumed normal diet and will be discharged with carafate and PPI.

## 2021-02-25 NOTE — Progress Notes (Addendum)
Pharmacy Antibiotic Note  Adam James is a 50 y.o. male admitted on 02/24/2021 with LLE gas gangrene.  Pharmacy has been consulted for Vancomycin and Zosyn dosing. Vancomycin 2000 mg IV given preop at 10 pm.  We will adjust vanc slightly with scr 2.16>crcl ~64 ml/Adam James  Addendum  Team changing zosyn to cefepime to avoid risk of worsening AKI with vanc.   Plan: Vancomycin 1250 mg IV q24h AUC 500 scr 2.16 Dc zosyn Cefepime 2g IV q8 Height: 6\' 3"  (190.5 cm) Weight: 109 kg (240 lb 4.8 oz) IBW/kg (Calculated) : 84.49F/U renal function  Temp (24hrs), Avg:98.6 F (37 C), Min:98 F (36.7 C), Max:99.1 F (37.3 C)  Recent Labs  Lab 02/21/21 1053 02/24/21 1708 02/24/21 1802 02/24/21 1926 02/25/21 0350  WBC 25.2* 25.5*  --   --  21.2*  CREATININE 1.72* 2.40*  --   --  2.16*  LATICACIDVEN  --   --  2.5* 1.0  --      Estimated Creatinine Clearance: 55.8 mL/min (A) (by C-G formula based on SCr of 2.16 mg/dL (H)).    Allergies  Allergen Reactions   Morphine Itching     04/27/21, PharmD, BCIDP, AAHIVP, CPP Infectious Disease Pharmacist 02/25/2021 7:34 AM

## 2021-02-25 NOTE — H&P (Addendum)
Date: 02/25/2021               Patient Name:  Adam James MRN: [id number]  DOB: 20-Mar-1971 Age / Sex: 50 y.o., male   PCP: Patient, No Pcp Per (Inactive)         Medical Service: Internal Medicine Teaching Service         Attending Physician: Dr. Oswaldo Done, Marquita Palms, *    First Contact: Dr. Ned Card Pager: [phone]  Second Contact: Dr. Mcarthur Rossetti Pager: [phone]       After Hours (After 5p/  First Contact Pager: [phone]  weekends / holidays): Second Contact Pager: [phone]   Chief Complaint: infected foot wound  History of Present Illness: Adam James is a 50 year old gentleman who presented to the ED today after pre-op labs for a CABG procedure revealed leukocytosis, elevated glucose and HbA1c and acute kidney injury.   He states that since February he has had wounds on both feet: an ulcer on the plantar aspect of his R foot as well as a ulcer on the dorsal aspect of the L foot. He states that he frequently pulls pieces of sock out of the wounds and keeps them clean with rubbing alcohol however he does not have sensation to the areas so cannot tell if he has been having pain as well. He denies any fever or malaise, but says that he is always cold. No chest pain, worsened shortness of breath.   Meds:  Amlodipine 5 mg once daily Atorvastatin 80 mg once daly Lasix 40 mg, 80 mg once daily, alternating doses every other day Aspirin 81 mg once daily Metformin 500 mg twice daily Glyburide 5 mg twice daily Metoprolol 25 mg once daily Irbesartan 75 mg once daily Silver sulfadiazine cream, 1 application daily   Allergies: Allergies as of 02/24/2021 - Review Complete 02/24/2021  Allergen Reaction Noted   Morphine Itching 02/23/2019   Past Medical History:  Diagnosis Date   CAD (coronary artery disease)    Cardiomyopathy (HCC)    Diabetic foot ulcer (HCC)    Dyslipidemia    Heart failure (HCC)    Myocardial infarction (HCC)    Type 2 diabetes mellitus (HCC)     Family  History:  Mother HTN Father CHF Brother CHF, HTN  Social History: Patient states that he lives alone in [place], [place]. He consumes cigarettes and smokeless tobacco. He admits financial strain in regards to the treatments he has required for his foot ulcers.  Review of Systems: A complete ROS was negative except as per HPI.   Physical Exam: Blood pressure 101/72, pulse 98, temperature 98 F (36.7 C), temperature source Oral, resp. rate 19, height 6\' 3"  (1.905 m), weight 109 kg, SpO2 100 %. Physical Exam Vitals and nursing note reviewed.  Cardiovascular:     Rate and Rhythm: Normal rate and regular rhythm.     Pulses:          Dorsalis pedis pulses are 2+ on the right side.     Heart sounds: Normal heart sounds.  Pulmonary:     Effort: Pulmonary effort is normal.     Breath sounds: Normal air entry. No transmitted upper airway sounds.     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 Foul odor and drainage from wound. Skin surrounding the wound is blackened though centrally there is a pink color. When probed, the tip of the probe was pink-tinted.) and callus (on lateral dorsal aspect of great toe) present. No erythema or warmth.     Toenail Condition: Right toenails are normal.     Left foot:     Skin integrity: No warmth.     Toenail Condition: Left toenails are normal.  Skin:    General: Skin is cool and dry.  Neurological:     General: No focal deficit present.     Mental Status: He is alert and oriented to person, place, and time.    EKG: personally reviewed my interpretation is sinus tachycardia with ventricular trigeminy  Assessment & Plan by Problem:  Mr.  Adam James is a 50 y.o. male with past medical history of diabetes, heart failure, who presents with leukocytosis and MRSA bacteremia admitted for necrotizing fasciitis of the L foot.   #Gas gangrene of left foot Labs on 02/21/2021 for CABG pre-op resulted many abnormalities including leukocytosis of 25.2. Blood culture 02/21/2021 resulted positive for MRSA bacteremia. On presentation to ED he was found to have a lactic acid of 2.5 which did correct with fluids to 1.0. L foot X-ray showed diffuse soft tissue edema about the lower leg, ankle, and foot; subcutaneous soft tissue gas loculations about the dorsum of the foot, predominantly overlying the distal fourth and fifth metatarsals; no bony erosion or sclerosis to suggest osteomyelitis. - Fungal, anaerobic, and aerobic wound cultures pending - Recheck CBC -Cefepime and vancomycin for diabetic foot infection  #Type 2 diabetes mellitus Blood glucose 259, HbA1c 8.8%. Patient endorses numbness to the feet. - novoLOG SS insulin, three times daily with meals - Hold metformin due to AKI - CBG monitoring every 6 hours  #Chronic heart failure with reduced ejection fraction Echo on 02/20/2021 showed EF of 30-35%, LV regional wall motion abnormalities with concentric hypertrophy, grade I diastolic dysfunction, and aortic dilatation. Patient currently denying shortness of breath, orthopnea, chest pain, lower extremity edema. - Check BNP  #Acute on chronic renal failure GFR on 02/21/2021 of 48, decreased to 32 on presentation. Patient states that he is staying hydrated however he has noticed he is urinating less frequently. BUN/Cr 30/2.40 (02/21/2021 18/1.72). Urinalysis 02/21/2021 significant for glucose 150, small Hgb, protein >300, presence of granular and hyaline casts as well as mucus; on ED presentation urinalysis showed >500 glucose, large Hgb, protein >300, specific gravity >1.030 with hyaline casts and mucus. Likely secondary to sepsis. - Recheck  BMP - Check sedimentation rate  #Diabetic foot ulcer Ulcer on plantar aspect of R foot over 5th tarsal head measuring 2 cm x 2 cm x 3 mm deep, foul smelling with odor. Tip of probe was pink-tinted on examination. Also present was a callus over the lateral plantar aspect of the R great toe. Patient states that his ulcers have started out as calluses and progressed. 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 Vascular ultrasound on 02/20/2021 showed bilateral pedal waveforms are within normal limits at rest.  - X-ray R foot - MRI L foot today, consider adding MRI R foot - Continue management for necrotizing fasciitis of L ankle and foot.  Dispo: Admit patient to Inpatient  with expected length of stay greater than 2 midnights.  Signed: Champ Mungo, DO 02/25/2021, 1:31 AM  After 5pm on weekdays and 1pm on weekends: On Call pager: [phone]

## 2021-02-25 NOTE — Progress Notes (Signed)
PHARMACY - PHYSICIAN COMMUNICATION CRITICAL VALUE ALERT - BLOOD CULTURE IDENTIFICATION (BCID)  Adam James is an 50 y.o. male who presented to [facility] on 02/24/2021 with a chief complaint of LLE gas gangrene.  Assessment:    2/4 Bcx with Staphlycoccus epidermidis  Name of physician (or Provider) Contacted: Rayann Atway, DO  Current antibiotics:  Cefepime 2g IV q8h Vancomycin 1250 mg IV q24h  Changes to prescribed antibiotics recommended:  Spoke with internal medicine team Recommended no additional abx needed for Staph Epi due to likely contaminant.  Results for orders placed or performed during the hospital encounter of 02/24/21  Blood Culture ID Panel (Reflexed) (Collected: 02/24/2021  5:22 PM)  Result Value Ref Range   Enterococcus faecalis NOT DETECTED NOT DETECTED   Enterococcus Faecium NOT DETECTED NOT DETECTED   Listeria monocytogenes NOT DETECTED NOT DETECTED   Staphylococcus species DETECTED (A) NOT DETECTED   Staphylococcus aureus (BCID) NOT DETECTED NOT DETECTED   Staphylococcus epidermidis DETECTED (A) NOT DETECTED   Staphylococcus lugdunensis NOT DETECTED NOT DETECTED   Streptococcus species NOT DETECTED NOT DETECTED   Streptococcus agalactiae NOT DETECTED NOT DETECTED   Streptococcus pneumoniae NOT DETECTED NOT DETECTED   Streptococcus pyogenes NOT DETECTED NOT DETECTED   A.calcoaceticus-baumannii NOT DETECTED NOT DETECTED   Bacteroides fragilis NOT DETECTED NOT DETECTED   Enterobacterales NOT DETECTED NOT DETECTED   Enterobacter cloacae complex NOT DETECTED NOT DETECTED   Escherichia coli NOT DETECTED NOT DETECTED   Klebsiella aerogenes NOT DETECTED NOT DETECTED   Klebsiella oxytoca NOT DETECTED NOT DETECTED   Klebsiella pneumoniae NOT DETECTED NOT DETECTED   Proteus species NOT DETECTED NOT DETECTED   Salmonella species NOT DETECTED NOT DETECTED   Serratia marcescens NOT DETECTED NOT DETECTED   Haemophilus influenzae NOT DETECTED NOT DETECTED    Neisseria meningitidis NOT DETECTED NOT DETECTED   Pseudomonas aeruginosa NOT DETECTED NOT DETECTED   Stenotrophomonas maltophilia NOT DETECTED NOT DETECTED   Candida albicans NOT DETECTED NOT DETECTED   Candida auris NOT DETECTED NOT DETECTED   Candida glabrata NOT DETECTED NOT DETECTED   Candida krusei NOT DETECTED NOT DETECTED   Candida parapsilosis NOT DETECTED NOT DETECTED   Candida tropicalis NOT DETECTED NOT DETECTED   Cryptococcus neoformans/gattii NOT DETECTED NOT DETECTED   Methicillin resistance mecA/C DETECTED (A) NOT DETECTED   Shirlee More, PharmD PGY2 Infectious Diseases Pharmacy Resident   Please check [email or web address] for unit-specific pharmacy phone numbers

## 2021-02-25 NOTE — Progress Notes (Signed)
     [address]       [name] [address]             [phone]       Spoke with the patient earlier today.  Concerns of bilateral foot and some ulcers that require further management.  Plans for surgery this admission.  Once the ulcers have been treated we can see him back as an outpatient.  Adam James Scrape

## 2021-02-25 NOTE — Progress Notes (Signed)
Orthopaedic Trauma Service Progress Note  S:   Pain improved from pre-op in L leg Baseline diabetic peripheral neuropathy   Upset at the perceived lack of communication.  I relayed to him that we (ortho) were going to see him at the end of our OR day in hopes that the MRIs would have been a for comprehensive conversation on what further interventions needed to be done. We are trying to be proactive in terms of ensuring that all preoperative orders were placed.  This was relayed to the medical team as well as the nursing staff who then related to the patient.  After discussing this with him he seemed to be more at ease.  Patient is about to go down for his MRIs  O: BP 117/68 (BP Location: Left Arm)   Pulse 92   Temp 98.4 F (36.9 C) (Oral)   Resp 19   Ht 6\' 3"  (1.905 m)   Wt 109 kg   SpO2 97%   BMI 30.04 kg/m   CBC CBC Latest Ref Rng & Units 02/25/2021 02/24/2021 02/21/2021  WBC 4.0 - 10.5 K/uL 21.2(H) 25.5(H) 25.2(H)  Hemoglobin 13.0 - 17.0 g/dL 02/23/2021) 10.1(L) 10.4(L)  Hematocrit 39.0 - 52.0 % 26.9(L) 30.4(L) 31.0(L)  Platelets 150 - 400 K/uL 303 396 382   Gen: A&O x 3, NAD Left LEx      Splint/dressing c/d/I      Toes cool but good color to toes       No sensation plantar aspect of foot but this is baseline        Mild sensation dorsum foot (baseline)      Moves toes w/o difficulty   A/P  50 y/o with numerous chronic uncontrolled medical comorbid conditions with Left foot and leg gas gangrene  L foot and leg gas gangrene s/p I&D  MRIs pending   Return to OR tomorrow with Dr. 52 for re-evaluation   NWB L leg     R foot diabetic ulcer   Dr. Lajoyce Corners to assess as well  WBAT R leg for now    Would like benefit from off loading type shoe   Continue per medical team   NPO after MN   Lajoyce Corners, PA-C [phone]) 02/25/2021, 5:02 PM  [facility] [address] [place] [phone] (F)

## 2021-02-25 NOTE — Anesthesia Postprocedure Evaluation (Signed)
Anesthesia Post Note  Patient: Adam James  Procedure(s) Performed: IRRIGATION AND DEBRIDEMENT EXTREMITY (Left: Ankle)     Patient location during evaluation: PACU Anesthesia Type: General Level of consciousness: awake and alert Pain management: pain level controlled Vital Signs Assessment: post-procedure vital signs reviewed and stable Respiratory status: spontaneous breathing, nonlabored ventilation, respiratory function stable and patient connected to nasal cannula oxygen Cardiovascular status: blood pressure returned to baseline and stable Postop Assessment: no apparent nausea or vomiting Anesthetic complications: no   No notable events documented.  Last Vitals:  Vitals:   02/25/21 0123 02/25/21 0311  BP: 101/72 114/64  Pulse: 98 87  Resp: 19 20  Temp: 36.7 C 36.9 C  SpO2: 100% 98%    Last Pain:  Vitals:   02/25/21 0311  TempSrc: Oral  PainSc:                  Nelle Don Sharone Picchi

## 2021-02-26 ENCOUNTER — Encounter (HOSPITAL_COMMUNITY): Payer: Self-pay | Admitting: Student in an Organized Health Care Education/Training Program

## 2021-02-26 ENCOUNTER — Other Ambulatory Visit: Payer: Self-pay | Admitting: Physician Assistant

## 2021-02-26 ENCOUNTER — Inpatient Hospital Stay (HOSPITAL_COMMUNITY): Payer: Self-pay | Admitting: Certified Registered Nurse Anesthetist

## 2021-02-26 ENCOUNTER — Encounter (HOSPITAL_COMMUNITY)
Admission: EM | Disposition: A | Discharge status: Home Health | Payer: Self-pay | Source: Home / Self Care | Attending: Student in an Organized Health Care Education/Training Program

## 2021-02-26 DIAGNOSIS — E1142 Type 2 diabetes mellitus with diabetic polyneuropathy: Secondary | ICD-10-CM

## 2021-02-26 DIAGNOSIS — E43 Unspecified severe protein-calorie malnutrition: Secondary | ICD-10-CM

## 2021-02-26 DIAGNOSIS — M726 Necrotizing fasciitis: Secondary | ICD-10-CM

## 2021-02-26 DIAGNOSIS — L03116 Cellulitis of left lower limb: Secondary | ICD-10-CM

## 2021-02-26 HISTORY — PX: I & D EXTREMITY: SHX5045

## 2021-02-26 LAB — CBC
HCT: 26 % — ABNORMAL LOW (ref 39.0–52.0)
HCT: 22 % — ABNORMAL LOW (ref 39.0–52.0)
Hemoglobin: 8.4 g/dL — ABNORMAL LOW (ref 13.0–17.0)
Hemoglobin: 7.1 g/dL — ABNORMAL LOW (ref 13.0–17.0)
MCH: 28.6 pg (ref 26.0–34.0)
MCH: 29.1 pg (ref 26.0–34.0)
MCHC: 32.3 g/dL (ref 30.0–36.0)
MCHC: 32.3 g/dL (ref 30.0–36.0)
MCV: 88.4 fL (ref 80.0–100.0)
MCV: 90.2 fL (ref 80.0–100.0)
Platelets: 271 10*3/uL (ref 150–400)
Platelets: 314 10*3/uL (ref 150–400)
RBC: 2.94 MIL/uL — ABNORMAL LOW (ref 4.22–5.81)
RBC: 2.44 MIL/uL — ABNORMAL LOW (ref 4.22–5.81)
RDW: 12.7 % (ref 11.5–15.5)
RDW: 12.8 % (ref 11.5–15.5)
WBC: 15.5 10*3/uL — ABNORMAL HIGH (ref 4.0–10.5)
WBC: 19 10*3/uL — ABNORMAL HIGH (ref 4.0–10.5)
nRBC: 0 % (ref 0.0–0.2)
nRBC: 0 % (ref 0.0–0.2)

## 2021-02-26 LAB — GLUCOSE, CAPILLARY
Glucose-Capillary: 189 mg/dL — ABNORMAL HIGH (ref 70–99)
Glucose-Capillary: 195 mg/dL — ABNORMAL HIGH (ref 70–99)
Glucose-Capillary: 170 mg/dL — ABNORMAL HIGH (ref 70–99)
Glucose-Capillary: 222 mg/dL — ABNORMAL HIGH (ref 70–99)
Glucose-Capillary: 186 mg/dL — ABNORMAL HIGH (ref 70–99)

## 2021-02-26 LAB — BASIC METABOLIC PANEL
Anion gap: 12 (ref 5–15)
BUN: 32 mg/dL — ABNORMAL HIGH (ref 6–20)
CO2: 22 mmol/L (ref 22–32)
Calcium: 7.8 mg/dL — ABNORMAL LOW (ref 8.9–10.3)
Chloride: 97 mmol/L — ABNORMAL LOW (ref 98–111)
Creatinine, Ser: 2.16 mg/dL — ABNORMAL HIGH (ref 0.61–1.24)
GFR, Estimated: 37 mL/min — ABNORMAL LOW (ref >60)
Glucose, Bld: 198 mg/dL — ABNORMAL HIGH (ref 70–99)
Potassium: 3.7 mmol/L (ref 3.5–5.1)
Sodium: 131 mmol/L — ABNORMAL LOW (ref 135–145)

## 2021-02-26 SURGERY — IRRIGATION AND DEBRIDEMENT EXTREMITY
Anesthesia: General | Laterality: Left

## 2021-02-26 MED ORDER — ASPIRIN EC 81 MG PO TBEC
81.0000 mg | DELAYED_RELEASE_TABLET | Freq: Every day | ORAL | Status: DC
  Administered 2021-02-27 – 2021-03-15 (×16): 81 mg via ORAL
  Filled 2021-02-26 (×17): qty 1

## 2021-02-26 MED ORDER — SODIUM CHLORIDE 0.45 % IV SOLN: INTRAVENOUS | Status: AC

## 2021-02-26 MED ORDER — FENTANYL CITRATE (PF) 250 MCG/5ML IJ SOLN
INTRAMUSCULAR | Status: DC | PRN
  Administered 2021-02-26: 25 ug via INTRAVENOUS
  Administered 2021-02-26: 50 ug via INTRAVENOUS
  Administered 2021-02-26: 75 ug via INTRAVENOUS
  Administered 2021-02-26: 25 ug via INTRAVENOUS

## 2021-02-26 MED ORDER — OXYCODONE HCL 5 MG PO TABS
5.0000 mg | ORAL_TABLET | Freq: Once | ORAL | Status: AC | PRN
Start: 2021-02-26 — End: 2021-02-26
  Administered 2021-02-26: 5 mg via ORAL

## 2021-02-26 MED ORDER — ACETAMINOPHEN 500 MG PO TABS
1000.0000 mg | ORAL_TABLET | Freq: Once | ORAL | Status: AC
  Administered 2021-02-26: 1000 mg via ORAL
  Filled 2021-02-26: qty 2

## 2021-02-26 MED ORDER — CHLORHEXIDINE GLUCONATE 0.12 % MT SOLN
OROMUCOSAL | Status: AC
  Administered 2021-02-26: 15 mL via OROMUCOSAL
  Filled 2021-02-26: qty 15

## 2021-02-26 MED ORDER — SODIUM CHLORIDE 0.9 % IV SOLN: INTRAVENOUS | Status: DC

## 2021-02-26 MED ORDER — CHLORHEXIDINE GLUCONATE 0.12 % MT SOLN: 15.0000 mL | Freq: Once | OROMUCOSAL | Status: AC

## 2021-02-26 MED ORDER — CEFAZOLIN SODIUM-DEXTROSE 2-4 GM/100ML-% IV SOLN
2.0000 g | INTRAVENOUS | Status: AC
  Administered 2021-02-26: 2 g via INTRAVENOUS
  Filled 2021-02-26: qty 100

## 2021-02-26 MED ORDER — RIVAROXABAN 10 MG PO TABS
10.0000 mg | ORAL_TABLET | Freq: Every day | ORAL | Status: DC
  Administered 2021-02-27 – 2021-03-01 (×3): 10 mg via ORAL
  Filled 2021-02-26 (×3): qty 1

## 2021-02-26 MED ORDER — PROPOFOL 10 MG/ML IV BOLUS
INTRAVENOUS | Status: AC
  Filled 2021-02-26: qty 20

## 2021-02-26 MED ORDER — DOCUSATE SODIUM 100 MG PO CAPS
100.0000 mg | ORAL_CAPSULE | Freq: Two times a day (BID) | ORAL | Status: DC
  Administered 2021-02-26 – 2021-03-08 (×16): 100 mg via ORAL
  Filled 2021-02-26 (×26): qty 1

## 2021-02-26 MED ORDER — PROMETHAZINE HCL 25 MG/ML IJ SOLN
6.2500 mg | INTRAMUSCULAR | Status: DC | PRN
Start: 2021-02-26 — End: 2021-02-26

## 2021-02-26 MED ORDER — ONDANSETRON HCL 4 MG/2ML IJ SOLN
INTRAMUSCULAR | Status: DC | PRN
  Administered 2021-02-26: 4 mg via INTRAVENOUS

## 2021-02-26 MED ORDER — LIDOCAINE 2% (20 MG/ML) 5 ML SYRINGE
INTRAMUSCULAR | Status: DC | PRN
  Administered 2021-02-26: 100 mg via INTRAVENOUS

## 2021-02-26 MED ORDER — MIDAZOLAM HCL 2 MG/2ML IJ SOLN
INTRAMUSCULAR | Status: AC
  Filled 2021-02-26: qty 2

## 2021-02-26 MED ORDER — FENTANYL CITRATE (PF) 100 MCG/2ML IJ SOLN
25.0000 ug | INTRAMUSCULAR | Status: DC | PRN
  Administered 2021-02-26 (×2): 50 ug via INTRAVENOUS

## 2021-02-26 MED ORDER — ALBUMIN HUMAN 5 % IV SOLN: INTRAVENOUS | Status: DC | PRN

## 2021-02-26 MED ORDER — OXYCODONE HCL 5 MG/5ML PO SOLN: 5.0000 mg | Freq: Once | ORAL | Status: AC | PRN

## 2021-02-26 MED ORDER — INSULIN GLARGINE-YFGN 100 UNIT/ML ~~LOC~~ SOLN
12.0000 [IU] | Freq: Every day | SUBCUTANEOUS | Status: DC
  Administered 2021-02-26 – 2021-02-27 (×2): 12 [IU] via SUBCUTANEOUS
  Filled 2021-02-26 (×3): qty 0.12

## 2021-02-26 MED ORDER — MIDAZOLAM HCL 2 MG/2ML IJ SOLN
INTRAMUSCULAR | Status: DC | PRN
  Administered 2021-02-26: 2 mg via INTRAVENOUS

## 2021-02-26 MED ORDER — PHENYLEPHRINE 40 MCG/ML (10ML) SYRINGE FOR IV PUSH (FOR BLOOD PRESSURE SUPPORT)
PREFILLED_SYRINGE | INTRAVENOUS | Status: DC | PRN
Start: 2021-02-26 — End: 2021-02-26
  Administered 2021-02-26: 120 ug via INTRAVENOUS
  Administered 2021-02-26: 80 ug via INTRAVENOUS
  Administered 2021-02-26 (×2): 160 ug via INTRAVENOUS
  Administered 2021-02-26: 200 ug via INTRAVENOUS

## 2021-02-26 MED ORDER — ORAL CARE MOUTH RINSE: 15.0000 mL | Freq: Once | OROMUCOSAL | Status: AC

## 2021-02-26 MED ORDER — 0.9 % SODIUM CHLORIDE (POUR BTL) OPTIME
TOPICAL | Status: DC | PRN
  Administered 2021-02-26: 2000 mL

## 2021-02-26 MED ORDER — FENTANYL CITRATE (PF) 250 MCG/5ML IJ SOLN
INTRAMUSCULAR | Status: AC
  Filled 2021-02-26: qty 5

## 2021-02-26 MED ORDER — FENTANYL CITRATE (PF) 100 MCG/2ML IJ SOLN
INTRAMUSCULAR | Status: AC
  Filled 2021-02-26: qty 2

## 2021-02-26 MED ORDER — PROPOFOL 10 MG/ML IV BOLUS
INTRAVENOUS | Status: DC | PRN
  Administered 2021-02-26: 200 mg via INTRAVENOUS

## 2021-02-26 SURGICAL SUPPLY — 36 items
BAG COUNTER SPONGE SURGICOUNT (BAG) IMPLANT
BLADE SURG 21 STRL SS (BLADE) ×2 IMPLANT
BNDG COHESIVE 3X5 TAN STRL LF (GAUZE/BANDAGES/DRESSINGS) ×2 IMPLANT
BNDG COHESIVE 6X5 TAN NS LF (GAUZE/BANDAGES/DRESSINGS) ×2 IMPLANT
BNDG COHESIVE 6X5 TAN STRL LF (GAUZE/BANDAGES/DRESSINGS) IMPLANT
BNDG GAUZE ELAST 4 BULKY (GAUZE/BANDAGES/DRESSINGS) IMPLANT
CASSETTE VERAFLO VERALINK (MISCELLANEOUS) ×2 IMPLANT
COVER SURGICAL LIGHT HANDLE (MISCELLANEOUS) ×4 IMPLANT
DRAPE DERMATAC (DRAPES) ×10 IMPLANT
DRAPE U-SHAPE 47X51 STRL (DRAPES) ×2 IMPLANT
DRESSING VERAFLO CLEANS CC MED (GAUZE/BANDAGES/DRESSINGS) ×2 IMPLANT
DRSG ADAPTIC 3X8 NADH LF (GAUZE/BANDAGES/DRESSINGS) IMPLANT
DRSG VERAFLO CLEANSE CC MED (GAUZE/BANDAGES/DRESSINGS) ×4
DURAPREP 26ML APPLICATOR (WOUND CARE) ×2 IMPLANT
ELECT REM PT RETURN 9FT ADLT (ELECTROSURGICAL)
ELECTRODE REM PT RTRN 9FT ADLT (ELECTROSURGICAL) IMPLANT
GAUZE SPONGE 4X4 12PLY STRL (GAUZE/BANDAGES/DRESSINGS) ×4 IMPLANT
GLOVE SURG ORTHO LTX SZ9 (GLOVE) ×2 IMPLANT
GLOVE SURG UNDER POLY LF SZ9 (GLOVE) ×2 IMPLANT
GOWN STRL REUS W/ TWL XL LVL3 (GOWN DISPOSABLE) ×2 IMPLANT
GOWN STRL REUS W/TWL XL LVL3 (GOWN DISPOSABLE) ×2
HANDPIECE INTERPULSE COAX TIP (DISPOSABLE)
KIT BASIN OR (CUSTOM PROCEDURE TRAY) ×2 IMPLANT
KIT TURNOVER KIT B (KITS) ×2 IMPLANT
MANIFOLD NEPTUNE II (INSTRUMENTS) ×2 IMPLANT
NS IRRIG 1000ML POUR BTL (IV SOLUTION) ×2 IMPLANT
PACK ORTHO EXTREMITY (CUSTOM PROCEDURE TRAY) ×2 IMPLANT
PAD ARMBOARD 7.5X6 YLW CONV (MISCELLANEOUS) ×4 IMPLANT
SET HNDPC FAN SPRY TIP SCT (DISPOSABLE) IMPLANT
STOCKINETTE IMPERVIOUS 9X36 MD (GAUZE/BANDAGES/DRESSINGS) IMPLANT
SUT ETHILON 2 0 PSLX (SUTURE) ×10 IMPLANT
SWAB COLLECTION DEVICE MRSA (MISCELLANEOUS) ×2 IMPLANT
SWAB CULTURE ESWAB REG 1ML (MISCELLANEOUS) IMPLANT
TOWEL GREEN STERILE (TOWEL DISPOSABLE) ×2 IMPLANT
TUBE CONNECTING 12X1/4 (SUCTIONS) ×2 IMPLANT
YANKAUER SUCT BULB TIP NO VENT (SUCTIONS) ×2 IMPLANT

## 2021-02-26 NOTE — Transfer of Care (Addendum)
Immediate Anesthesia Transfer of Care Note  Patient: Adam James  Procedure(s) Performed: IRRIGATION AND DEBRIDEMENT OF LEG (Left)  Patient Location: PACU  Anesthesia Type:General  Level of Consciousness: awake, alert  and oriented  Airway & Oxygen Therapy: Patient Spontanous Breathing  Post-op Assessment: Report given to RN and Post -op Vital signs reviewed and stable  Post vital signs: Reviewed and stable  Last Vitals:  Vitals Value Taken Time  BP 114/76   Temp    Pulse 102 02/26/21 1624  Resp 12 02/26/21 1624  SpO2 96 % 02/26/21 1624  Vitals shown include unvalidated device data.  Last Pain:  Vitals:   02/26/21 1449  TempSrc:   PainSc: 0-No pain         Complications: No notable events documented.

## 2021-02-26 NOTE — Evaluation (Signed)
Physical Therapy Evaluation Patient Details Name: Adam James MRN: [id number] DOB: 01/03/1971 Today's Date: 02/26/2021   History of Present Illness  Pt is a 50 y.o. male admitted 02/24/21 with bilateral foot wounds. Pt with L foot diabetic infection with gas gangrene, R foot diabetic ulcer. S/p L foot I&D 8/1; plan to return to OR 8/3 for re-evaluation. Ortho workup reports LLE diabetic insensate neuropathy with necrotizing fasciitis involving the L anterior/lateral compartments and dorsum of L foot. PMH includes DM2, neuropathy, CAD, cardiomyopathy.   Clinical Impression  Pt presents with an overall decrease in functional mobility secondary to above. PTA, pt independent with mobility, primarily sedentary ("I don't do much because I'm worried about my heart rupturing"), lives with daughter. Today, pt mod indep with bed mobility, but declines OOB transfer secondary to pain and fatigue, stating, "I just want to rest until my surgery today." Educ on role of acute PT with mobility, precautions, DME training and d/c planning. Pt would benefit from continued acute PT services to maximize functional mobility and independence; will further assess mobility post-op to allow for d/c recommendations; pending surgical course, pt may require post-acute rehab services.    Follow Up Recommendations  (TBD post-op - may require post-acute rehab)    Equipment Recommendations   (TBD post-op)    Recommendations for Other Services       Precautions / Restrictions Precautions Precautions: Fall Restrictions Weight Bearing Restrictions: Yes RLE Weight Bearing: Weight bearing as tolerated LLE Weight Bearing: Non weight bearing      Mobility  Bed Mobility Overal bed mobility: Modified Independent             General bed mobility comments: Mod indep with supine<>sit with HOB elevated; pt coming to sit EOB then immediately returning to supine secondary to L foot pain in dependent position; declines  further attempts    Transfers                 General transfer comment: pt declined  Ambulation/Gait                Stairs            Wheelchair Mobility    Modified Rankin (Stroke Patients Only)       Balance Overall balance assessment: Needs assistance   Sitting balance-[name] Scale: Fair                                       Pertinent Vitals/Pain Pain Assessment: Faces Faces Pain Scale:  (0/10 rest, 9-10/10 in dependent position) Pain Location: L foot Pain Descriptors / Indicators: Grimacing;Guarding;Discomfort Pain Intervention(s): Monitored during session;Limited activity within patient's tolerance    Home Living Family/patient expects to be discharged to:: Private residence Living Arrangements: Children Available Help at Discharge: Family;Available PRN/intermittently Type of Home: Mobile home Home Access: Stairs to enter Entrance Stairs-Rails: Doctor, general practice of Steps: 6-7 Home Layout: One level Home Equipment: None Additional Comments: Lives with 43 y.o. daughter who is gone a lot    Prior Function Level of Independence: Independent         Comments: Primarily sedentary (reports "I don't do much because I'm worried about rupturing my heart... awaiting a triple bypass sx"); watches tv; drives     Hand Dominance        Extremity/Trunk Assessment   Upper Extremity Assessment Upper Extremity Assessment: LUE deficits/detail LUE Deficits / Details: "I think I  tore my rotator cuff, I can't lift my arm very high" - pt declined formal assessment    Lower Extremity Assessment Lower Extremity Assessment: RLE deficits/detail;LLE deficits/detail RLE Deficits / Details: R heel wrapped in gauze - pt declined formal LE assessment, but observation shows functional hip/knee strength at least 3/5 with bed mobility LLE Deficits / Details: L foot wrapped in gauze, pt endorses significant pain in dependent  position - pt declined formal LE assessment, but observation shows functional hip/knee strength at least 3/5 with bed mobility LLE: Unable to fully assess due to pain       Communication   Communication: No difficulties  Cognition Arousal/Alertness: Awake/alert Behavior During Therapy: WFL for tasks assessed/performed Overall Cognitive Status: Within Functional Limits for tasks assessed                                 General Comments: WFL for majority of tasks; pt states, "I'm very annoyed" - pt understandably frustrated by current situation; pt also states, "I should have just had the heart attack, then I would have..." - when asked if pt would like to talk to someone about these feelings, pt declines and states, "I don't believe in God" (educ on services, pt still declines; RN aware)      General Comments General comments (skin integrity, edema, etc.): Increased time explaining role of acute PT - including mobility and strength training post-op with new WB precautions, DME needs, d/c planning    Exercises     Assessment/Plan    PT Assessment Patient needs continued PT services  PT Problem List Decreased strength;Decreased range of motion;Decreased activity tolerance;Decreased balance;Decreased mobility;Decreased knowledge of use of DME;Decreased knowledge of precautions;Cardiopulmonary status limiting activity;Decreased skin integrity;Pain       PT Treatment Interventions DME instruction;Gait training;Stair training;Functional mobility training;Therapeutic activities;Therapeutic exercise;Balance training;Patient/family education;Wheelchair mobility training    PT Goals (Current goals can be found in the Care Plan section)  Acute Rehab PT Goals Patient Stated Goal: "I just want to rest until my surgery" PT Goal Formulation: With patient Time For Goal Achievement: 03/12/21 Potential to Achieve Goals: Good    Frequency Min 3X/week   Barriers to discharge  Inaccessible home environment;Decreased caregiver support      Co-evaluation               AM-PAC PT "6 Clicks" Mobility  Outcome Measure Help needed turning from your back to your side while in a flat bed without using bedrails?: None Help needed moving from lying on your back to sitting on the side of a flat bed without using bedrails?: None Help needed moving to and from a bed to a chair (including a wheelchair)?: A Little Help needed standing up from a chair using your arms (e.g., wheelchair or bedside chair)?: A Little Help needed to walk in hospital room?: A Little Help needed climbing 3-5 steps with a railing? : A Lot 6 Click Score: 19    End of Session   Activity Tolerance: Patient limited by pain Patient left: in bed;with call bell/phone within reach Nurse Communication: Mobility status PT Visit Diagnosis: Other abnormalities of gait and mobility (R26.89);Pain Pain - Right/Left: Left Pain - part of body: Ankle and joints of foot    Time: 4132-4401 PT Time Calculation (min) (ACUTE ONLY): 17 min   Charges:   PT Evaluation $PT Eval Moderate Complexity: 1 Mod     Ina Homes, PT, DPT Acute  Rehabilitation Services  Pager [phone] Office [phone]  Malachy Chamber 02/26/2021, 10:29 AM

## 2021-02-26 NOTE — Anesthesia Postprocedure Evaluation (Signed)
Anesthesia Post Note  Patient: Adam James  Procedure(s) Performed: IRRIGATION AND DEBRIDEMENT OF LEG (Left)     Patient location during evaluation: PACU Anesthesia Type: General Level of consciousness: awake and alert and oriented Pain management: pain level controlled Vital Signs Assessment: post-procedure vital signs reviewed and stable Respiratory status: spontaneous breathing, nonlabored ventilation and respiratory function stable Cardiovascular status: blood pressure returned to baseline Postop Assessment: no apparent nausea or vomiting Anesthetic complications: no   No notable events documented.              Kaylyn Layer

## 2021-02-26 NOTE — Anesthesia Preprocedure Evaluation (Addendum)
Anesthesia Evaluation  Patient identified by MRN, date of birth, ID band Patient awake    Reviewed: Allergy & Precautions, NPO status , Patient's Chart, lab work & pertinent test results  History of Anesthesia Complications Negative for: history of anesthetic complications  Airway Mallampati: II  TM Distance: >3 FB Neck ROM: Full    Dental  (+) Dental Advisory Given, Loose, Poor Dentition,    Pulmonary Current Smoker,    Pulmonary exam normal        Cardiovascular + CAD and + Past MI  Normal cardiovascular exam     Neuro/Psych negative neurological ROS  negative psych ROS   GI/Hepatic negative GI ROS, Neg liver ROS,   Endo/Other  diabetes, Type 2  Renal/GU Cr 2.16  negative genitourinary   Musculoskeletal negative musculoskeletal ROS (+)   Abdominal   Peds  Hematology  (+) anemia , Hgb 8.4   Anesthesia Other Findings Day of surgery medications reviewed with patient.  Reproductive/Obstetrics negative OB ROS                            Anesthesia Physical Anesthesia Plan  ASA: 3  Anesthesia Plan: General   Post-op Pain Management:    Induction: Intravenous  PONV Risk Score and Plan: Treatment may vary due to age or medical condition, Midazolam and Ondansetron  Airway Management Planned: LMA  Additional Equipment: None  Intra-op Plan:   Post-operative Plan: Extubation in OR  Informed Consent: I have reviewed the patients History and Physical, chart, labs and discussed the procedure including the risks, benefits and alternatives for the proposed anesthesia with the patient or authorized representative who has indicated his/her understanding and acceptance.     Dental advisory given  Plan Discussed with: CRNA  Anesthesia Plan Comments:        Anesthesia Quick Evaluation

## 2021-02-26 NOTE — Consult Note (Signed)
ORTHOPAEDIC CONSULTATION  REQUESTING PHYSICIAN: Tyson Alias, *  Chief Complaint: Ulceration cellulitis and pain left lower extremity.  HPI: Adam James is a 50 y.o. male who presents with abscess involving the dorsal lateral aspect the left foot as well as the lateral aspect of the left leg.  Patient states he started having symptoms about 3 weeks ago.  Patient is status post amputation of the right little toe and underwent acute irrigation and debridement with Dr. Marcello Fennel for the abscess and ulceration left leg and left foot.  Past Medical History:  Diagnosis Date   CAD (coronary artery disease)    Cardiomyopathy (HCC)    Diabetic foot ulcer (HCC)    Dyslipidemia    Heart failure (HCC)    Myocardial infarction (HCC)    Type 2 diabetes mellitus (HCC)    Past Surgical History:  Procedure Laterality Date   APPENDECTOMY     CARDIAC CATHETERIZATION     COLON SURGERY     I & D EXTREMITY Left 02/24/2021   Procedure: IRRIGATION AND DEBRIDEMENT EXTREMITY;  Surgeon: Myrene Galas, MD;  Location: [facility] OR;  Service: Orthopedics;  Laterality: Left;   LEFT HEART CATH AND CORONARY ANGIOGRAPHY N/A 09/13/2020   Procedure: LEFT HEART CATH AND CORONARY ANGIOGRAPHY;  Surgeon: Lennette Bihari, MD;  Location: [facility];  Service: Cardiovascular;  Laterality: N/A;   TOE AMPUTATION  2020   Social History   Socioeconomic History   Marital status: Single    Spouse name: Not on file   Number of children: Not on file   Years of education: Not on file   Highest education level: Not on file  Occupational History   Not on file  Tobacco Use   Smoking status: Every Day    Types: Cigarettes   Smokeless tobacco: Never   Tobacco comments:    5-6 daily  Vaping Use   Vaping Use: Never used  Substance and Sexual Activity   Alcohol use: Not Currently    Comment: Very rare   Drug use: Not Currently    Types: Marijuana   Sexual activity: Not on file  Other Topics Concern   Not  on file  Social History Narrative   Not on file   Social Determinants of Health   Financial Resource Strain: High Risk   Difficulty of Paying Living Expenses: Very hard  Food Insecurity: No Food Insecurity   Worried About Programme researcher, broadcasting/film/video in the Last Year: Never true   Barista in the Last Year: Never true  Transportation Needs: Unmet Transportation Needs   Lack of Transportation (Medical): Yes   Lack of Transportation (Non-Medical): Yes  Physical Activity: Not on file  Stress: Not on file  Social Connections: Not on file   Family History  Problem Relation Age of Onset   Hypertension Mother    Heart failure Father    Hypertension Brother    Congestive Heart Failure Brother    - negative except otherwise stated in the family history section Allergies  Allergen Reactions   Morphine Itching   Prior to Admission medications   Medication Sig Start Date End Date Taking? Authorizing Provider  amLODipine (NORVASC) 5 MG tablet Take 1 tablet (5 mg total) by mouth daily. 02/20/21  Yes Baldo Daub, MD  aspirin EC 81 MG tablet Take 1 tablet (81 mg total) by mouth daily. Swallow whole. 02/20/21  Yes Baldo Daub, MD  atorvastatin (LIPITOR) 80 MG tablet Take  1 tablet (80 mg total) by mouth daily. 02/20/21  Yes Baldo DaubMunley, Brian J, MD  furosemide (LASIX) 40 MG tablet Take 1 tablet (40 mg total) by mouth daily. Take 40 mg every other day alternating with 2 tablets (80mg ) every other day Patient taking differently: Take 40-80 mg by mouth daily. Take 40 mg every other day alternating with 2 tablets (80mg ) every other day 02/20/21  Yes Baldo DaubMunley, Brian J, MD  glyBURIDE (DIABETA) 5 MG tablet Take 5 mg by mouth 2 (two) times daily. 10/12/20  Yes [provider]  irbesartan (AVAPRO) 75 MG tablet Take 1 tablet (75 mg total) by mouth daily. 01/20/21  Yes Baldo DaubMunley, Brian J, MD  metFORMIN (GLUCOPHAGE) 500 MG tablet Take 500 mg by mouth 2 (two) times daily. 10/12/20  Yes [provider]   metoprolol succinate (TOPROL-XL) 25 MG 24 hr tablet Take 1 tablet (25 mg total) by mouth daily. 01/20/21  Yes Baldo DaubMunley, Brian J, MD  silver sulfADIAZINE (SILVADENE) 1 % cream Apply 1 application topically daily.   Yes [provider]   DG Tibia/Fibula Left  Result Date: 02/24/2021 CLINICAL DATA:  Left leg cellulitis EXAM: LEFT ANKLE - 2 VIEW; LEFT TIBIA AND FIBULA - 2 VIEW; LEFT FOOT - 2 VIEW COMPARISON:  None. FINDINGS: No fracture or dislocation of the left tibia or fibula, ankle, or foot. Diffuse soft tissue edema about the lower leg, ankle, and foot. There are subcutaneous soft tissue gas loculations about the dorsum of the foot, predominantly overlying the distal fourth and fifth metatarsals. No bony erosion or sclerosis to suggest osteomyelitis. IMPRESSION: 1. Diffuse soft tissue edema about the lower leg, ankle, and foot. 2. There are subcutaneous soft tissue gas loculations about the dorsum of the foot, predominantly overlying the distal fourth and fifth metatarsals. Findings are concerning for gas-forming infection. 3. No bony erosion or sclerosis to suggest osteomyelitis. MRI is the most sensitive test for the detection of bone marrow edema and osteomyelitis if clinically suspected. Electronically Signed   By: Lauralyn PrimesAlex  Bibbey M.D.   On: 02/24/2021 18:58   DG Ankle 2 Views Left  Result Date: 02/24/2021 CLINICAL DATA:  Left leg cellulitis EXAM: LEFT ANKLE - 2 VIEW; LEFT TIBIA AND FIBULA - 2 VIEW; LEFT FOOT - 2 VIEW COMPARISON:  None. FINDINGS: No fracture or dislocation of the left tibia or fibula, ankle, or foot. Diffuse soft tissue edema about the lower leg, ankle, and foot. There are subcutaneous soft tissue gas loculations about the dorsum of the foot, predominantly overlying the distal fourth and fifth metatarsals. No bony erosion or sclerosis to suggest osteomyelitis. IMPRESSION: 1. Diffuse soft tissue edema about the lower leg, ankle, and foot. 2. There are subcutaneous soft tissue gas  loculations about the dorsum of the foot, predominantly overlying the distal fourth and fifth metatarsals. Findings are concerning for gas-forming infection. 3. No bony erosion or sclerosis to suggest osteomyelitis. MRI is the most sensitive test for the detection of bone marrow edema and osteomyelitis if clinically suspected. Electronically Signed   By: Lauralyn PrimesAlex  Bibbey M.D.   On: 02/24/2021 18:58   MR TIBIA FIBULA LEFT WO CONTRAST  Result Date: 02/25/2021 CLINICAL DATA:  Soft tissue infection suspected, lower leg, xray done concern for necrotizing fasciitis EXAM: MRI OF LOWER LEFT EXTREMITY WITHOUT CONTRAST TECHNIQUE: Multiplanar, multisequence MR imaging of the left lower extremity was performed. No intravenous contrast was administered. COMPARISON:  Radiograph 02/24/2021 FINDINGS: Bones/Joint/Cartilage The cortex is intact. There is no significant marrow signal alteration. Partially visualized  chronic fracture deformity of the right mid fibula. Muscles and Tendons There is extensive inflammatory change in the anterior compartment of the lower leg, with intramuscular edema and marked thickening of the intermuscular fascia with high-signal intensity fluid primarily along the extensor digitorum muscle. There is also intramuscular edema to a lesser degree in the lateral and the posterior compartments without fascial thickening or focal fluid collection. Soft tissues Extensive subcutaneous soft tissue swelling. Recent anterior incision above the ankle with appearance of packing material. IMPRESSION: Extensive inflammatory changes within the anterior compartment of the lower leg, with myositis and marked thickening of the intermuscular fascia with high-signal intensity fluid primarily along the extensor digitorum muscle, concerning for infectious myositis and fasciitis. Appearance of surgical packing material noted along in anterior incision just above the ankle, correlate with operative details. Additional  intramuscular edema to a lesser degree in the lateral and deep posterior compartments which is likely reactive. No evidence of osteomyelitis. These results were called by telephone at the time of interpretation on 02/25/2021 at 6:49 pm to provider Dr. Mcarthur Rossetti, who verbally acknowledged these results. Electronically Signed   By: Caprice Renshaw   On: 02/25/2021 18:50   MR ANKLE LEFT WO CONTRAST  Result Date: 02/25/2021 CLINICAL DATA:  Soft tissue infection suspected, ankle, xray done concern for necrotizing fasciitis EXAM: MRI OF THE LEFT ANKLE WITHOUT CONTRAST TECHNIQUE: Multiplanar, multisequence MR imaging of the ankle was performed. No intravenous contrast was administered. COMPARISON:  Radiograph 02/24/2021 FINDINGS: TENDONS Peroneal: Mild tenosynovitis of the peroneal longus and brevis tendons above and below the lateral malleolus. There is an accessory peroneal tendon inserting on the calcaneus consistent with a peroneus quartus. Posteromedial: Mild posterior tibial tendon tenosynovitis above and below the medial malleolus, worst distally. Flexor digitorum longus tendon intact. Flexor hallucis longus tendon intact. Anterior: Focal tenosynovitis of the tibialis anterior tendon and extensor digitorum tendon at the level of the ankle. There is the appearance of packing material/postsurgical change along the dorsal foot/anterior ankle extending along the extensor digitorum tendon. Extensor hallucis longus tendon intact Extensor digitorum longus tendon intact. Achilles:  Intact. Plantar Fascia: Intact. LIGAMENTS Lateral: Anterior talofibular ligament intact. Calcaneofibular ligament intact. Posterior talofibular ligament intact. Anterior and posterior tibiofibular ligaments intact. Medial: Deltoid ligament intact. Spring ligament intact. CARTILAGE Ankle Joint: Small tibiotalar joint effusion. Normal ankle mortise. No chondral defect. Subtalar Joints/Sinus Tarsi: Normal subtalar joints. No subtalar joint effusion. Normal  sinus tarsi. Bones: Focal periarticular bone marrow edema at the fifth digit metatarsophalangeal joint, only seen on series 8, images 50-56). This is adjacent to the dorsal soft tissue wound. There is a small joint effusion of the fifth MTP joint. There is minimal edema in the distal fourth metatarsal, partially visualized and possibly reactive. There is no other marrow signal abnormality in the hindfoot, distal tibial or fibula. Soft Tissue: Recent dorsal approach incision and drainage/debridement along the partially visualized forefoot, with appearance of surgical packing material extending along the course of the extensor digitorum tendon. Extensive foot and ankle soft tissue swelling. There are small pockets of fluid adjacent to the distal fifth metatarsal (for example series 8, images 54-56). IMPRESSION: Recent incision and debridement/drainage along the dorsolateral forefoot, with appearance of surgical packing material extending along the course of the extensor digitorum tendon, correlate with operative details. Periarticular bone marrow edema at the fifth metatarsophalangeal joint with small joint effusion, concerning for septic arthritis and osteomyelitis. There are adjacent small pockets of fluid which could be small abscesses. Additional bony edema in the  distal fourth metatarsal, to a lesser degree than the fifth, partially visualized and may be reactive or early osteomyelitis. Focal tenosynovitis of the tibialis anterior tendon and extensor digitorum tendon at the level of the ankle. Mild tenosynovitis of the peroneal longus and brevis tendons and posterior tibial tendon above and below the lateral and medial malleoli. Extensive soft tissue swelling of the foot and ankle with intramuscular edema which could represent myositis. Electronically Signed   By: Caprice Renshaw   On: 02/25/2021 18:15   DG Foot 2 Views Left  Result Date: 02/24/2021 CLINICAL DATA:  Left leg cellulitis EXAM: LEFT ANKLE - 2 VIEW;  LEFT TIBIA AND FIBULA - 2 VIEW; LEFT FOOT - 2 VIEW COMPARISON:  None. FINDINGS: No fracture or dislocation of the left tibia or fibula, ankle, or foot. Diffuse soft tissue edema about the lower leg, ankle, and foot. There are subcutaneous soft tissue gas loculations about the dorsum of the foot, predominantly overlying the distal fourth and fifth metatarsals. No bony erosion or sclerosis to suggest osteomyelitis. IMPRESSION: 1. Diffuse soft tissue edema about the lower leg, ankle, and foot. 2. There are subcutaneous soft tissue gas loculations about the dorsum of the foot, predominantly overlying the distal fourth and fifth metatarsals. Findings are concerning for gas-forming infection. 3. No bony erosion or sclerosis to suggest osteomyelitis. MRI is the most sensitive test for the detection of bone marrow edema and osteomyelitis if clinically suspected. Electronically Signed   By: Lauralyn Primes M.D.   On: 02/24/2021 18:58   DG Foot 2 Views Right  Result Date: 02/25/2021 CLINICAL DATA:  Ulcerate foot. EXAM: RIGHT FOOT - 2 VIEW COMPARISON:  Right foot images 06/26/2019 FINDINGS: Soft tissue swelling cannot be excluded. No foreign body. Prior right fifth transmetatarsal amputation. No acute bony abnormality. No bony erosions. Mild degenerative changes first MTP joint. Mild calcaneal spurring. IMPRESSION: Soft tissue swelling cannot be excluded. No radiopaque foreign body. Transmetatarsal amputation right fifth digit. No acute or focal bony abnormality. No bony erosions. Electronically Signed   By: Maisie Fus  Register   On: 02/25/2021 07:37   - pertinent xrays, CT, MRI studies were reviewed and independently interpreted  Positive ROS: All other systems have been reviewed and were otherwise negative with the exception of those mentioned in the HPI and as above.  Physical Exam: General: Alert, no acute distress Psychiatric: Patient is competent for consent with normal mood and affect Lymphatic: No axillary or  cervical lymphadenopathy Cardiovascular: No pedal edema Respiratory: No cyanosis, no use of accessory musculature GI: No organomegaly, abdomen is soft and non-tender    Images:  @ENCIMAGES @  Labs:  Lab Results  Component Value Date   HGBA1C 8.8 (H) 02/21/2021   ESRSEDRATE >140 (H) 02/25/2021   REPTSTATUS PENDING 02/24/2021   GRAMSTAIN  02/24/2021    RARE WBC PRESENT,BOTH PMN AND MONONUCLEAR FEW GRAM POSITIVE COCCI IN PAIRS Performed at [facility], [address], [place], [place]    CULT PENDING 02/24/2021    Lab Results  Component Value Date   ALBUMIN 1.9 (L) 02/24/2021   ALBUMIN 2.1 (L) 02/21/2021     CBC EXTENDED Latest Ref Rng & Units 02/26/2021 02/25/2021 02/24/2021  WBC 4.0 - 10.5 K/uL 15.5(H) 21.2(H) 25.5(H)  RBC 4.22 - 5.81 MIL/uL 2.94(L) 3.10(L) 3.42(L)  HGB 13.0 - 17.0 g/dL 04/26/2021) 8.9(L) 10.1(L)  HCT 39.0 - 52.0 % 26.0(L) 26.9(L) 30.4(L)  PLT 150 - 400 K/uL 271 303 396    Neurologic: Patient does not  have protective sensation bilateral lower extremities.   MUSCULOSKELETAL:   Skin: Examination patient has cellulitis involving the entire anterior lateral compartments of the left leg this is tender to palpation proximally.  There is purulent drainage from the debridement wound on the anterior compartment of the left leg.  There is purulent drainage from the dorsal incision of the left foot.  Patient has a strong dorsalis pedis and posterior tibial pulse on the right I cannot palpate a good pulse on the left secondary to the swelling.  Review of the MRI scan shows extensive fluid around the fascia of the anterior and lateral compartments of the left leg this involves the entire compartment up to the knee.  There is some edema in the bones of the foot this is unclear if it indicates osteomyelitis.  Patient has a white cell count that has gone from 25,000-15,000 his albumin is 1.9 with severe protein caloric malnutrition.  Patient's hemoglobin A1c is  8.8 and a sed rate is greater than 140.  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  Plan: I will plan for repeat debridement today and placement of an installation wound VAC.  Discussed with the patient this is a life and limb threatening infection.    I will be out of town Friday and will have another orthopedic partner continue with the debridement care.  Thank you for the consult and the opportunity to see Mr. Jaterrius Ricketson, MD [facility] [phone] 7:52 AM

## 2021-02-26 NOTE — Progress Notes (Signed)
OT Cancellation Note  Patient Details Name: Denver Bentson MRN: [id number] DOB: 1971/05/06   Cancelled Treatment:     Pt refused therapy today. He is scheduled for surgery today.   Azani Brogdon 02/26/2021, 10:07 AM

## 2021-02-26 NOTE — Anesthesia Procedure Notes (Signed)
Procedure Name: LMA Insertion Date/Time: 02/26/2021 3:22 PM Performed by: Lelon Perla, CRNA Pre-anesthesia Checklist: Patient identified, Emergency Drugs available, Suction available and Patient being monitored Patient Re-evaluated:Patient Re-evaluated prior to induction Oxygen Delivery Method: Circle System Utilized Preoxygenation: Pre-oxygenation with 100% oxygen Induction Type: IV induction Ventilation: Mask ventilation without difficulty LMA: LMA inserted LMA Size: 4.0 Number of attempts: 1 Airway Equipment and Method: Bite block Placement Confirmation: positive ETCO2 Tube secured with: Tape Dental Injury: Teeth and Oropharynx as per pre-operative assessment

## 2021-02-26 NOTE — Op Note (Signed)
02/26/2021  4:22 PM  PATIENT:  Adam James    PRE-OPERATIVE DIAGNOSIS: Necrotizing fasciitis left leg  POST-OPERATIVE DIAGNOSIS:  Same  PROCEDURE:  IRRIGATION AND DEBRIDEMENT OF left foot and entire left leg. Local tissue rearrangement for wound closure 30 x 6 cm.  Application of installation wound VAC  SURGEON:  [name], MD  PHYSICIAN ASSISTANT:None ANESTHESIA:   General  PREOPERATIVE INDICATIONS:  Adam James is a  50 y.o. male with a diagnosis of LEFT LEG WOUND who failed conservative measures and elected for surgical management.    The risks benefits and alternatives were discussed with the patient preoperatively including but not limited to the risks of infection, bleeding, nerve injury, cardiopulmonary complications, the need for revision surgery, among others, and the patient was willing to proceed.  OPERATIVE IMPLANTS: Cleanse choice blue sponges x5.  @ENCIMAGES @  OPERATIVE FINDINGS: Patient had extensive necrosis of the muscle and fascia of the anterior and lateral compartments of the leg as well as the multiple compartments of the left foot.  Fascia muscle and purulence was sent for cultures.  OPERATIVE PROCEDURE: Patient brought the operating room and underwent general anesthetic.  After adequate levels anesthesia were obtained patient's left lower extremity was prepped using DuraPrep draped into a sterile field a timeout was called.  A incision was made extensile from the proximal aspect of the tibia to incorporate the small drainage wound over the anterior ankle and also incorporate the small drainage wound over the dorsum of the foot.  There is purulence from the fifth metatarsal all the way up to the proximal aspect of the anterior compartment.  The fascia was necrotic extensively this was excised.  The muscle in the anterior and lateral compartment was also extensively necrotic and this easily pulled out with digital retraction.  The remaining muscle of  the gastrocnemius had good contractility.  This was irrigated with normal saline the anterior tibial vessels were intact and not disturbed.  The remaining tissue was viable.  5 cleanse choice blue wound sponges were placed and local tissue rearrangement was used to close the wound over the sponges.  This was covered and derma tack Covan this had a good suction fit installation therapy was started patient was extubated taken the PACU in stable condition.  Debridement type: Excisional Debridement  Side: left  Body Location: Left foot left leg including the anterior and lateral compartments.  Tools used for debridement: scalpel and rongeur  Pre-debridement Wound size (cm):   Length: 1        Width: 1     Depth: 1   Post-debridement Wound size (cm):   Length: 30        Width: 6     Depth: 5   Debridement depth beyond dead/damaged tissue down to healthy viable tissue: yes  Tissue layer involved: skin, subcutaneous tissue, muscle / fascia  Nature of tissue removed: Slough, Necrotic, Devitalized Tissue, Non-viable tissue, Purulence, and Other: Necrotic fascia.  Irrigation volume: 2 L     Irrigation fluid type: Normal Saline    DISCHARGE PLANNING:  Antibiotic duration: Continue IV antibiotics long-term and adjust according to the cultures.  Weightbearing: Weightbearing as tolerated on the left.  Pain medication: Opioid pathway  Dressing care/ Wound VAC: Continue with installation therapy anticipate return to the operating room on Monday or Tuesday.  Ambulatory devices: Walker or crutches  Discharge to: Patient is still at risk for loss of life and limb from this necrotizing fasciitis.  Continue inpatient stay.  Follow-up: In the office 1 week post operative.

## 2021-02-26 NOTE — Progress Notes (Addendum)
HD#3 SUBJECTIVE:  Patient Summary: Adam James is a 50 y.o. with a pertinent PMH of diabetes, ischemic heart disease, chronic heart failure with reduced EF, and CKD IIIa who presented s/p irrigation and debridement for left foot diabetic infection with gas gangrene.  Overnight Events: No acute events overnight  Interim History: This is hospital day 3 for Adam James who was seen and examined at the bedside this morning. He is worried about the viability of his left leg and expresses concern about the depth of his infection. He does note that he is no longer having any pain in his left foot/leg. Orthopedics informed the patient that they will take him back to the OR for further debridement. He did make [place] aware that he was in [place] for similar complaints shortly before his pre-op testing.   OBJECTIVE:  Vital Signs: Vitals:   02/26/21 0030 02/26/21 0427 02/26/21 0807 02/26/21 1113  BP: 104/67 101/72 115/76 121/66  Pulse: 100 100 98 98  Resp: 18 19 18 18   Temp: 98.6 F (37 C) 98.1 F (36.7 C) 98.5 F (36.9 C) 98.1 F (36.7 C)  TempSrc: Oral Oral Oral Oral  SpO2: 90% 92% 96% 97%  Weight:  111.3 kg    Height:       Supplemental O2: Room Air SpO2: 97%  Filed Weights   02/25/21 0130 02/26/21 0427  Weight: 109 kg 111.3 kg     Intake/Output Summary (Last 24 hours) at 02/26/2021 1221 Last data filed at 02/26/2021 0500 Gross per 24 hour  Intake 1607 ml  Output 600 ml  Net 1007 ml   Net IO Since Admission: 1,974.52 mL [02/26/21 1221]  Physical Exam: General: Patient is not in any acute distress or pain at this time CV: RRR. No murmurs, rubs, or gallops. No LE edema Pulmonary: Lungs CTAB. Normal effort. No wheezing or rales. Extremities: Palpable radial and DP pulses on the right. 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 2cm ulcer on plantar aspect of R foot under the base of the 4th-5th metatarsal, surrounding skin is necrotic, however, it is wrapped in gauze today per wound  care recommendations. Unable to assess LLE due to post-op dressing. Skin: Warm and dry. No obvious rash or lesions. Neuro: A&Ox3. Reduced sensation in lower extremities. No focal deficit. Psych: Normal mood and affect  Patient Lines/Drains/Airways Status     Active Line/Drains/Airways     Name Placement date Placement time Site Days   Peripheral IV 02/24/21 20 G Right Antecubital 02/24/21  1707  Antecubital  2   Peripheral IV 02/24/21 20 G Left Antecubital 02/24/21  1744  Antecubital  2   Open Drain 2 Left;Anterior Foot 0.1 Fr. 02/24/21  2325  Foot  2   Incision (Closed) 02/24/21 Leg Left 02/24/21  2326  -- 2   Wound / Incision (Open or Dehisced) 02/25/21 Diabetic ulcer Foot Right 02/25/21  0130  Foot  1   Wound / Incision (Open or Dehisced) 02/25/21 Diabetic ulcer Foot Right 02/25/21  0130  Foot  1            Pertinent Labs: CBC Latest Ref Rng & Units 02/26/2021 02/25/2021 02/24/2021  WBC 4.0 - 10.5 K/uL 15.5(H) 21.2(H) 25.5(H)  Hemoglobin 13.0 - 17.0 g/dL 04/26/2021) 8.9(L) 10.1(L)  Hematocrit 39.0 - 52.0 % 26.0(L) 26.9(L) 30.4(L)  Platelets 150 - 400 K/uL 271 303 396    CMP Latest Ref Rng & Units 02/26/2021 02/25/2021 02/24/2021  Glucose 70 - 99 mg/dL 04/26/2021) 182(X) 937(J)  BUN  6 - 20 mg/dL 93(A) 35(T) 73(U)  Creatinine 0.61 - 1.24 mg/dL 2.02(R) 4.27(C) 6.23(J)  Sodium 135 - 145 mmol/L 131(L) 131(L) 130(L)  Potassium 3.5 - 5.1 mmol/L 3.7 3.8 4.1  Chloride 98 - 111 mmol/L 97(L) 97(L) 96(L)  CO2 22 - 32 mmol/L 22 21(L) 21(L)  Calcium 8.9 - 10.3 mg/dL 7.8(L) 8.2(L) 8.4(L)  Total Protein 6.5 - 8.1 g/dL - - 6.9  Total Bilirubin 0.3 - 1.2 mg/dL - - 0.7  Alkaline Phos 38 - 126 U/L - - 97  AST 15 - 41 U/L - - 20  ALT 0 - 44 U/L - - 13    Recent Labs    02/25/21 2127 02/26/21 0555 02/26/21 1102  GLUCAP 249* 189* 195*      ASSESSMENT/PLAN:   Assessment: Principal Problem:   Gangrene of left foot (HCC) Active Problems:   Cardiomyopathy (HCC)   Type 2 diabetes mellitus (HCC)    Diabetic foot ulcer (HCC)   CAD (coronary artery disease)   Necrotizing fasciitis (HCC)   Left leg cellulitis   Diabetic polyneuropathy associated with type 2 diabetes mellitus (HCC)   Severe protein-calorie malnutrition (HCC)   Plan: #Left sided diabetic foot infection with gas gangrene and necrotizing fasciitis MRI performed on 8/2 significant for small joint effusion at 5th MTP on the left consistent with septic arthritis vs osteomyelitis, as well as inflammatory changes in the anterior compartment of the lower leg with myositis and thickened fascia consistent with infectious myositis vs fasciitiis. White blood cell count is down trending (15.5) and continues to receive IV antibiotics. The anaerobic/aerobic wound cultures grew gram positive cocci in pairs.  - Continue vanc/cefepime - Fungal cultures still pending - Plan to return to OR with ortho for further debridement and wound vac placement  #Right sided diabetic foot ulcer Patient continues to deny any pain in his right foot, although he does have reduced sensation in this area. Pulses are palpable and the wound is dressed in gauze per wound care recommendations. - Wound care consulted, appreciate recommendations - Will continue with dressing changes twice daily   #Type 2 Diabetes with diabetic neuropathy HbA1c 8.8. Patient received 10U Lantus with SSI yesterday. Blood sugars have been under 200, however, not quite at goal of <180.  - Increased Lantus to 12 units qhs with SSI - Continue CBG monitoring   #Chronic HFrEF #Ischemic heart disease Patient continues to deny any symptoms of chest pain, SOB, orthopnea, or lower extremity edema. Home medications were held in the setting of hypotension upon presentation. - Continue to hold lasix, metoprolol, irbesartan, and amlodipine - Holding aspirin in setting of debridement in the OR today - Continue atorvastatin   #Acute on chronic renal failure Patient known to have CKD IIIa at his  baseline and had worsening of his kidney function upon presentation. Cr has remained stable since yesterday. - Continue IV fluids today   - Continue to monitor renal function - Avoid nephrotoxic agents  Best Practice: Diet: NPO IVF: Fluids: 0.45% NS 50cc/hr x 10 hours VTE: rivaroxaban (XARELTO) tablet 10 mg HELD Code: Full AB: Vanc/Cefepime Therapy Recs: PT/OT eval  DISPO: Anticipated discharge in 2 days to Home pending  clinical improvement .  Signature: Elza Rafter, D.O.  Internal Medicine Resident, PGY-1 [facility]  Pager: [phone] 12:21 PM, 02/26/2021   Please contact the on call pager after 5 pm and on weekends at [phone].

## 2021-02-26 NOTE — Plan of Care (Signed)
  Problem: Health Behavior/Discharge Planning: Goal: Ability to manage health-related needs will improve Outcome: Progressing   Problem: Clinical Measurements: Goal: Will remain free from infection Outcome: Progressing   Problem: Coping: Goal: Level of anxiety will decrease Outcome: Progressing   Problem: Safety: Goal: Ability to remain free from injury will improve Outcome: Progressing   

## 2021-02-27 ENCOUNTER — Encounter (HOSPITAL_COMMUNITY): Payer: Self-pay | Admitting: Orthopedic Surgery

## 2021-02-27 LAB — BASIC METABOLIC PANEL
Anion gap: 11 (ref 5–15)
BUN: 32 mg/dL — ABNORMAL HIGH (ref 6–20)
CO2: 18 mmol/L — ABNORMAL LOW (ref 22–32)
Calcium: 7.8 mg/dL — ABNORMAL LOW (ref 8.9–10.3)
Chloride: 101 mmol/L (ref 98–111)
Creatinine, Ser: 2 mg/dL — ABNORMAL HIGH (ref 0.61–1.24)
GFR, Estimated: 40 mL/min — ABNORMAL LOW (ref >60)
Glucose, Bld: 172 mg/dL — ABNORMAL HIGH (ref 70–99)
Potassium: 4.1 mmol/L (ref 3.5–5.1)
Sodium: 130 mmol/L — ABNORMAL LOW (ref 135–145)

## 2021-02-27 LAB — HEMOGLOBIN AND HEMATOCRIT, BLOOD
HCT: 21 % — ABNORMAL LOW (ref 39.0–52.0)
Hemoglobin: 7 g/dL — ABNORMAL LOW (ref 13.0–17.0)

## 2021-02-27 LAB — GLUCOSE, CAPILLARY
Glucose-Capillary: 185 mg/dL — ABNORMAL HIGH (ref 70–99)
Glucose-Capillary: 181 mg/dL — ABNORMAL HIGH (ref 70–99)
Glucose-Capillary: 210 mg/dL — ABNORMAL HIGH (ref 70–99)
Glucose-Capillary: 194 mg/dL — ABNORMAL HIGH (ref 70–99)

## 2021-02-27 LAB — CBC
HCT: 21.8 % — ABNORMAL LOW (ref 39.0–52.0)
Hemoglobin: 7 g/dL — ABNORMAL LOW (ref 13.0–17.0)
MCH: 28.9 pg (ref 26.0–34.0)
MCHC: 32.1 g/dL (ref 30.0–36.0)
MCV: 90.1 fL (ref 80.0–100.0)
Platelets: 305 10*3/uL (ref 150–400)
RBC: 2.42 MIL/uL — ABNORMAL LOW (ref 4.22–5.81)
RDW: 12.9 % (ref 11.5–15.5)
WBC: 18.8 10*3/uL — ABNORMAL HIGH (ref 4.0–10.5)
nRBC: 0 % (ref 0.0–0.2)

## 2021-02-27 LAB — TYPE AND SCREEN
ABO/RH(D): A NEG
Antibody Screen: NEGATIVE

## 2021-02-27 LAB — CULTURE, BLOOD (ROUTINE X 2)

## 2021-02-27 LAB — PREPARE RBC (CROSSMATCH)

## 2021-02-27 MED ORDER — SODIUM CHLORIDE 0.9% IV SOLUTION: Freq: Once | INTRAVENOUS | Status: DC

## 2021-02-27 MED ORDER — SODIUM CHLORIDE 0.9 % IV SOLN
3.0000 g | Freq: Four times a day (QID) | INTRAVENOUS | Status: DC
  Administered 2021-02-27 – 2021-03-09 (×40): 3 g via INTRAVENOUS
  Filled 2021-02-27 (×8): qty 8
  Filled 2021-02-27: qty 3
  Filled 2021-02-27 (×3): qty 8
  Filled 2021-02-27: qty 3
  Filled 2021-02-27 (×5): qty 8
  Filled 2021-02-27: qty 3
  Filled 2021-02-27: qty 8
  Filled 2021-02-27: qty 3
  Filled 2021-02-27 (×6): qty 8
  Filled 2021-02-27 (×2): qty 3
  Filled 2021-02-27 (×16): qty 8

## 2021-02-27 MED ORDER — SODIUM CHLORIDE 0.9% IV SOLUTION: Freq: Once | INTRAVENOUS | Status: AC

## 2021-02-27 MED ORDER — METOPROLOL SUCCINATE ER 25 MG PO TB24
12.5000 mg | ORAL_TABLET | Freq: Every day | ORAL | Status: DC
  Administered 2021-02-27 – 2021-02-28 (×2): 12.5 mg via ORAL
  Filled 2021-02-27 (×2): qty 1

## 2021-02-27 NOTE — Progress Notes (Addendum)
Rehab Admissions Coordinator Note:  Patient was screened by Clois Dupes for appropriateness for an Inpatient Acute Rehab Consult per OT recs. Noted no assistance needed by therapist for bed mobility and patient declined transfers due to LE pain. Patient has not demonstrated the ability/need for intensive CIR level therapy at this time. Noted also to return to OR next week. We will follow his progress, but will not place rehab conuslt at this time until he demonstrates participation and need for CIR level rehab.Clois Dupes RN MSN 02/27/2021, 2:35 PM  I can be reached at [phone].

## 2021-02-27 NOTE — Evaluation (Signed)
Occupational Therapy Evaluation Patient Details Name: Adam James MRN: [id number] DOB: 04-26-1971 Today's Date: 02/27/2021    History of Present Illness Pt is a 50 y.o. male admitted 02/24/21 with bilateral foot wounds. Pt with L foot diabetic infection with gas gangrene, R foot diabetic ulcer. S/p L foot I&D 8/1; plan to return to OR 8/3 for re-evaluation. Ortho workup reports LLE diabetic insensate neuropathy with necrotizing fasciitis involving the L anterior/lateral compartments and dorsum of L foot. PMH includes DM2, neuropathy, CAD, cardiomyopathy.   Clinical Impression   PTA, pt lives with daughter (works night shift) and reports Independence with all daily tasks. Pt notably distressed about current medical situation and limitations in L LE function. Extended time spent acknowledging pt's concerns, DME needs and activity progression to facilitate independence. Pt ultimately only agreeable to sit EOB to work on positioning of L LE. Pt declined to attempt standing due to pain and likely anxiety. Collaborated with staff for trial of prevalon boot for optimal positioning of LE in bed. Encouraged pt to sit EOB frequently throughout day to work on getting foot flat on floor to maximize standing tolerance. Based on rehab potential, would recommend CIR at this time as pt could likely progress well.    Follow Up Recommendations  CIR    Equipment Recommendations  3 in 1 bedside commode;Other (comment) (Rolling walker; to be further assessed)    Recommendations for Other Services       Precautions / Restrictions Precautions Precautions: Fall;Other (comment) Precaution Comments: L LE wound vac, impaired L LE dorsiflexion Restrictions Weight Bearing Restrictions: Yes RLE Weight Bearing: Weight bearing as tolerated LLE Weight Bearing: Weight bearing as tolerated      Mobility Bed Mobility Overal bed mobility: Modified Independent             General bed mobility comments: No assist  needed as pt reports modest and declined therapist assist. does benefit from cues for wound vac cord safety    Transfers                 General transfer comment: pt declined due to L LE pain    Balance Overall balance assessment: Needs assistance Sitting-balance support: Feet supported;No upper extremity supported Sitting balance-[name] Scale: Good                                     ADL either performed or assessed with clinical judgement   ADL Overall ADL's : Needs assistance/impaired Eating/Feeding: Independent;Sitting   Grooming: Independent;Sitting   Upper Body Bathing: Independent;Sitting   Lower Body Bathing: Moderate assistance;Sitting/lateral leans   Upper Body Dressing : Independent;Sitting   Lower Body Dressing: Moderate assistance;Sitting/lateral leans                 General ADL Comments: Pt declined to attempt standing due to L LE pain. Anticipate Mod A for LB ADLs via lateral leans at this time. Pt reports being modest and often declines assist with ADLs     Vision Baseline Vision/History: Wears glasses Wears Glasses: At all times Patient Visual Report: No change from baseline Vision Assessment?: No apparent visual deficits     Perception     Praxis      Pertinent Vitals/Pain Pain Assessment: Faces Faces Pain Scale: Hurts even more Pain Location: L foot with ROM Pain Descriptors / Indicators: Grimacing;Guarding;Discomfort Pain Intervention(s): Monitored during session;Limited activity within patient's tolerance  Hand Dominance Right   Extremity/Trunk Assessment Upper Extremity Assessment Upper Extremity Assessment: LUE deficits/detail LUE Deficits / Details: "I think I tore my rotator cuff, I can't lift my arm very high" - can lift to 80*. reports he can reach back of head but painful LUE Coordination: decreased gross motor   Lower Extremity Assessment Lower Extremity Assessment: Defer to PT evaluation    Cervical / Trunk Assessment Cervical / Trunk Assessment: Normal   Communication Communication Communication: No difficulties   Cognition Arousal/Alertness: Awake/alert Behavior During Therapy: WFL for tasks assessed/performed;Agitated Overall Cognitive Status: Within Functional Limits for tasks assessed                                 General Comments: WFL cognitively though agitated and "annoyed" with situation. Calmed some when explained dorsiflexion movement/lacking as pt thought MD implied he would never be able to walk again. Aggressively charged comments at therapist though pt endorses just feeling upset about whole situation   General Comments  Educated on dorsiflexion movements, application to walking as pt upset after MD visit and assummed he would not be able to walk again. Educated on best positioning with prevalon boot order placed and coordination with MD.    Exercises     Shoulder Instructions      Home Living Family/patient expects to be discharged to:: Private residence Living Arrangements: Children Available Help at Discharge: Family;Available PRN/intermittently Type of Home: Mobile home Home Access: Stairs to enter Entrance Stairs-Number of Steps: 6-7 Entrance Stairs-Rails: Right;Left Home Layout: One level     Bathroom Shower/Tub: Chief Strategy Officer: Standard Bathroom Accessibility: Yes How Accessible: Accessible via walker;Other (comment) (unsure if wheelchair would fit) Home Equipment: None   Additional Comments: Lives with 87 y.o. daughter who works night shift      Prior Functioning/Environment Level of Independence: Independent        Comments: Primarily sedentary (reports "I don't do much because I'm worried about rupturing my heart... awaiting a triple bypass sx"); watches tv; drives        OT Problem List: Decreased strength;Decreased range of motion;Decreased activity tolerance;Impaired balance (sitting  and/or standing);Decreased knowledge of use of DME or AE;Pain      OT Treatment/Interventions: Therapeutic exercise;Self-care/ADL training;DME and/or AE instruction;Therapeutic activities;Patient/family education;Balance training    OT Goals(Current goals can be found in the care plan section) Acute Rehab OT Goals Patient Stated Goal: Decrease pain OT Goal Formulation: With patient Time For Goal Achievement: 03/13/21 Potential to Achieve Goals: Good  OT Frequency: Min 2X/week   Barriers to D/C:            Co-evaluation              AM-PAC OT "6 Clicks" Daily Activity     Outcome Measure Help from another person eating meals?: None Help from another person taking care of personal grooming?: None Help from another person toileting, which includes using toliet, bedpan, or urinal?: A Lot Help from another person bathing (including washing, rinsing, drying)?: A Lot Help from another person to put on and taking off regular upper body clothing?: A Little Help from another person to put on and taking off regular lower body clothing?: A Lot 6 Click Score: 17   End of Session Nurse Communication: Mobility status  Activity Tolerance: Patient limited by pain Patient left: in bed;with call bell/phone within reach  OT Visit Diagnosis: Unsteadiness on feet (R26.81);Other  abnormalities of gait and mobility (R26.89);Pain Pain - Right/Left: Left Pain - part of body: Ankle and joints of foot                Time: 3545-6256 OT Time Calculation (min): 20 min Charges:  OT General Charges $OT Visit: 1 Visit OT Evaluation $OT Eval Moderate Complexity: 1 Mod  Bradd Canary, OTR/L Acute Rehab Services Office: [phone]   Lorre Munroe 02/27/2021, 8:45 AM

## 2021-02-27 NOTE — Plan of Care (Signed)
  Problem: Activity: Goal: Risk for activity intolerance will decrease Outcome: Progressing   Problem: Coping: Goal: Level of anxiety will decrease Outcome: Progressing   Problem: Safety: Goal: Ability to remain free from injury will improve Outcome: Progressing   

## 2021-02-27 NOTE — Progress Notes (Signed)
HD#4 SUBJECTIVE:  Patient Summary: Adam James is a 50 y.o. with a pertinent PMH of diabetes, ischemic heart disease, chronic heart failure with reduced EF, and CKD IIIa who presented s/p irrigation and debridement for left foot diabetic infection with gas gangrene.  Overnight Events: Patient was having pain in his left leg after surgery yesterday and received his PRN pain medications, which improved his symptoms.   Interim History: This is hospital day 4 for Adam James who was seen and evaluated at the bedside this morning. The patient states that he is not having any pain in his leg at this time. He is aware that there are further plans to return to the OR with ortho. He denies any chest pain, palpitations, SOB, or lower extremity edema. Discussed the patient's low hemoglobin level with him and that we will give him a unit of blood. Overall the patient remains stable.  OBJECTIVE:  Vital Signs: Vitals:   02/27/21 0948 02/27/21 1007 02/27/21 1107 02/27/21 1230  BP: 113/74 120/78 123/71 125/86  Pulse: (!) 101 98 93 93  Resp: 18 18 19 18   Temp: 97.7 F (36.5 C) 98.2 F (36.8 C) 98.4 F (36.9 C) 97.9 F (36.6 C)  TempSrc: Oral Oral Oral Oral  SpO2:   98%   Weight:      Height:       Supplemental O2: Room Air SpO2: 96%  Filed Weights   02/25/21 0130 02/26/21 0427 02/27/21 0306  Weight: 109 kg 111.3 kg 110.4 kg     Intake/Output Summary (Last 24 hours) at 02/27/2021 1319 Last data filed at 02/27/2021 1237 Gross per 24 hour  Intake 3466.79 ml  Output 1550 ml  Net 1916.79 ml   Net IO Since Admission: 3,891.31 mL [02/27/21 1319]  Physical Exam: General: Patient is not in any acute distress or pain at this time CV: RRR. No murmurs, rubs, or gallops. No LE edema Pulmonary: Lungs CTAB. Normal effort. No wheezing or rales. Extremities: Palpable radial and DP pulses on the right. 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 2cm ulcer on plantar aspect of R foot under the base of the  4th-5th metatarsal, surrounding skin is necrotic, however, it is wrapped in gauze. Unable to assess LLE due to post-op dressing that extends up to patient's knee. Wound vac in place with 150cc serosanguinous fluid output.  Skin: Warm and dry. No obvious rash or lesions. Neuro: A&Ox3. Reduced sensation in lower extremities. No focal deficit. Psych: Normal mood and affect  Patient Lines/Drains/Airways Status     Active Line/Drains/Airways     Name Placement date Placement time Site Days   Peripheral IV 02/24/21 20 G Right Antecubital 02/24/21  1707  Antecubital  3   Peripheral IV 02/24/21 20 G Left Antecubital 02/24/21  1744  Antecubital  3   Peripheral IV 02/26/21 Left;Posterior Forearm 02/26/21  1824  Forearm  1   Open Drain 2 Left;Anterior Foot 0.1 Fr. 02/24/21  2325  Foot  3   Negative Pressure Wound Therapy Leg Left 02/26/21  1550  --  1   Incision (Closed) 02/24/21 Leg Left 02/24/21  2326  -- 3   Incision (Closed) 02/26/21 Leg Left 02/26/21  1534  -- 1   Wound / Incision (Open or Dehisced) 02/25/21 Diabetic ulcer Foot Right 02/25/21  0130  Foot  2   Wound / Incision (Open or Dehisced) 02/25/21 Diabetic ulcer Foot Right 02/25/21  0130  Foot  2  Pertinent Labs: CBC Latest Ref Rng & Units 02/27/2021 02/26/2021 02/26/2021  WBC 4.0 - 10.5 K/uL 18.8(H) 19.0(H) 15.5(H)  Hemoglobin 13.0 - 17.0 g/dL 7.0(L) 7.1(L) 8.4(L)  Hematocrit 39.0 - 52.0 % 21.8(L) 22.0(L) 26.0(L)  Platelets 150 - 400 K/uL 305 314 271    CMP Latest Ref Rng & Units 02/27/2021 02/26/2021 02/25/2021  Glucose 70 - 99 mg/dL 536(U) 440(H) 474(Q)  BUN 6 - 20 mg/dL 59(D) 63(O) 75(I)  Creatinine 0.61 - 1.24 mg/dL 4.33(I) 9.51(O) 8.41(Y)  Sodium 135 - 145 mmol/L 130(L) 131(L) 131(L)  Potassium 3.5 - 5.1 mmol/L 4.1 3.7 3.8  Chloride 98 - 111 mmol/L 101 97(L) 97(L)  CO2 22 - 32 mmol/L 18(L) 22 21(L)  Calcium 8.9 - 10.3 mg/dL 7.8(L) 7.8(L) 8.2(L)  Total Protein 6.5 - 8.1 g/dL - - -  Total Bilirubin 0.3 - 1.2 mg/dL - - -   Alkaline Phos 38 - 126 U/L - - -  AST 15 - 41 U/L - - -  ALT 0 - 44 U/L - - -    Recent Labs    02/26/21 2106 02/27/21 0610 02/27/21 1107  GLUCAP 186* 181* 210*     Pertinent Imaging: No results found.  ASSESSMENT/PLAN:  Assessment: Principal Problem:   Gangrene of left foot (HCC) Active Problems:   Cardiomyopathy (HCC)   Type 2 diabetes mellitus (HCC)   Diabetic foot ulcer (HCC)   CAD (coronary artery disease)   Necrotizing fasciitis (HCC)   Left leg cellulitis   Diabetic polyneuropathy associated with type 2 diabetes mellitus (HCC)   Severe protein-calorie malnutrition (HCC)   Plan: #Left sided diabetic foot infection with gas gangrene and necrotizing fasciitis Patient returned to the OR on 8/3 and is postoperative day 1 for limb salvage intervention of the left leg. In the OR, purulence was noted from the metatarsal heads to the proximal aspect of the anterior compartment with extensive necrotic fascia that was excised. The muscle in the anterior and lateral compartment was also necrotic and was pulled out as well, all consistent with MRI findings. A wound vac was placed and noted to have 150cc serosanguinous fluid this morning upon examination. Leukocytosis is stable and the patient continues to receive IV antibiotics. Initial wound cultures  grew abundant strep agalactiae and rare staph aureus.  Will consult infectious disease as the patient will require a prolonged course of antibiotics given the extent of his infection.  - ID consulted, appreciate recommendations - Continue vanc/cefepime - Fungal cultures still pending from initial debridement - Tissue cultures pending from 8/3 - Will return to the OR next week with ortho for further debridement - Monitor wound vac output  #Right sided diabetic foot ulcer Patient continues to deny any pain in his right foot. Pulses are palpable and the wound is dressed in gauze per wound care recommendations. - Wound care  consulted, appreciate recommendations - Continue with dressing changes twice daily   #Type 2 Diabetes with diabetic neuropathy HbA1c 8.8. Patient received 12U Lantus with SSI yesterday. Blood sugars been at goal of <180. - Continue Lantus to 12 units qhs with SSI - Continue CBG monitoring   #Chronic HFrEF #Ischemic heart disease Patient continues to deny any symptoms of chest pain, SOB, orthopnea, or lower extremity edema. Home medications were initially held in the setting of hypotension.  - Continue to hold lasix, irbesartan, and amlodipine - Re-initiated metoprolol at reduced dose, 12.5mg  daily  - Continue atorvastatin and aspirin  #Acute on chronic renal failure Patient has CKD  IIIa at his baseline and had worsening of his kidney function upon presentation. Cr has improved today with IV fluids. - Continue to monitor renal function - Avoid nephrotoxic agents  Best Practice: Diet: Carb modified diet IVF: None VTE: rivaroxaban (XARELTO) tablet 10 mg HELD Code: Full AB: Vanc/Cefepime Therapy Recs: PT/OT recommending CIR DISPO: Anticipated discharge pending  clinical improvement .    Signature: Elza Rafter, D.O.  Internal Medicine Resident, PGY-1 [facility]  Pager: [phone] 1:19 PM, 02/27/2021   Please contact the on call pager after 5 pm and on weekends at [phone].

## 2021-02-27 NOTE — Consult Note (Addendum)
[facility]                                                                                        Patient Identification: Patient Name: Adam James MRN: [id number] Admit Date: 02/24/2021  4:40 PM Today's Date: 02/27/2021 Reason for consult: Necrotising fascitis  Requesting provider: Tyson Alias   Principal Problem:   Gangrene of left foot (HCC) Active Problems:   Cardiomyopathy (HCC)   Type 2 diabetes mellitus (HCC)   Diabetic foot ulcer (HCC)   CAD (coronary artery disease)   Necrotizing fasciitis (HCC)   Left leg cellulitis   Diabetic polyneuropathy associated with type 2 diabetes mellitus (HCC)   Severe protein-calorie malnutrition (HCC)   Antibiotics: Vancomycin 8/1- c                     Pip/tazo 8/1, cefepime 8/2-8/3, cefazolin 8/3-   Lines/Tubes: Left foot drain, Left wound vac, PIVs   Assessment Necrotizing fasciitis of the left foot and left leg Left DFU/osteomyelitis/septic arthritis/abscesses ( 5th MTP joint region , 4th metatarsal) Small ulcer at the base of rt 4th/5th metatarsal: wound care following Staph hominis/staph epidermidis in 1/2 sets is likely a contaminant  Uncontrolled diabetes mellitus CKD H/o smoking: he is trying to cut down on it and counseled  Recommendations  Continue vancomycin and will change cefepime to Unasyn. MRSA coverage might not be needed if staph aureus is MSSA.  There is plan for serial debridement of left foot/leg from Ortho for potential salvage of his limb ABI of his lower extremities to assess his vasculature of lower extremities  Will follow cultures peripherally and make changes as needed  Final antibiotic recommendations pending surgical interventions as stated above Monitor Vanc trough, CBC and BMP Management of BG per IM  Plan discussed with patient/ID pharmacy /Primary   Rest of the management as per the primary  team. Please call with questions or concerns.  Thank you for the consult  Odette Fraction, MD Infectious Disease Physician [facility] [address] [place], [place] [address] Phone: [phone]  Fax: [phone] __________________________________________________________________________________________________________ HPI and Hospital Course: 50 year old male with PMH of DM, CHF with reduced ejection fraction status post ischemic heart disease, CAD , hyperlipidemia , CKD who presented to the ED on 8/1 with complaints of left leg redness and swelling.  He tells me he was seen at a hospital in [place] recently for left foot wound and was prescribed a course of?  Amoxicillin or Augmentin recently in the last 2 weeks.  He states he has had wound in the left foot since February 2022.  He was supposed to have a CABG done this week however he was sent in for evaluation given left leg pain and swelling.   At ED, he was afebrile, WBC was elevated up to 21.2. LA 2.5.  labs was remarkable for creatinine 2.4 (baseline 1.1-1.7).   X-ray and MRI of the left foot and leg as below concerning for gas forming infection/necrotising fasciitis of left leg Seen by orthopedics and patient underwent I&D of left foot  and entire left leg on 8/3.  OR notes reviewed. Patient had extensive necrosis of the muscle and fascia of the anterior and lateral compartments of the leg as well as the multiple compartments of the left foot.. OR cultures pending.  However left foot cultures on 8/1 growing abundant Streptococcus agalactiae and staff aureus.   Currently on vancomycin and cefepime.  There is a plan for limb salvage by Ortho withserial debridements planned next week.   ROS: General- Denies fever, chills, loss of appetite and loss of weight HEENT - Denies headache, blurry vision, neck pain, sinus pain Chest - Denies any chest pain, SOB or cough CVS- Denies any  dizziness/lightheadedness, syncopal attacks, palpitations Abdomen- Denies any nausea, vomiting, abdominal pain, hematochezia and diarrhea Neuro - Denies any weakness, numbness, tingling sensation Psych - Denies any changes in mood irritability or depressive symptoms GU- Denies any burning, dysuria, hematuria or increased frequency of urination Skin - denies any rashes/lesions MSK - denies any joint pain/swelling or restricted ROM   Past Medical History:  Diagnosis Date   CAD (coronary artery disease)    Cardiomyopathy (HCC)    Diabetic foot ulcer (HCC)    Dyslipidemia    Heart failure (HCC)    Myocardial infarction (HCC)    Type 2 diabetes mellitus (HCC)    Past Surgical History:  Procedure Laterality Date   APPENDECTOMY     CARDIAC CATHETERIZATION     COLON SURGERY     I & D EXTREMITY Left 02/24/2021   Procedure: IRRIGATION AND DEBRIDEMENT EXTREMITY;  Surgeon: Myrene Galas, MD;  Location: [facility] OR;  Service: Orthopedics;  Laterality: Left;   I & D EXTREMITY Left 02/26/2021   Procedure: IRRIGATION AND DEBRIDEMENT OF LEG;  Surgeon: [name], MD;  Location: [facility] OR;  Service: Orthopedics;  Laterality: Left;   LEFT HEART CATH AND CORONARY ANGIOGRAPHY N/A 09/13/2020   Procedure: LEFT HEART CATH AND CORONARY ANGIOGRAPHY;  Surgeon: Lennette Bihari, MD;  Location: [facility] INVASIVE CV LAB;  Service: Cardiovascular;  Laterality: N/A;   TOE AMPUTATION  2020    Scheduled Meds:  aspirin EC  81 mg Oral Daily   atorvastatin  80 mg Oral Daily   docusate sodium  100 mg Oral BID   insulin aspart  0-9 Units Subcutaneous TID WC   insulin glargine-yfgn  12 Units Subcutaneous QHS   metoprolol succinate  12.5 mg Oral Daily   rivaroxaban  10 mg Oral Daily   Continuous Infusions:  sodium chloride 75 mL/hr at 02/26/21 2100   ceFEPime (MAXIPIME) IV 2 g (02/27/21 0204)   vancomycin 1,250 mg (02/26/21 1854)   PRN Meds:.acetaminophen, alum & mag hydroxide-simeth, fentaNYL (SUBLIMAZE) injection,  HYDROmorphone, polyethylene glycol, senna  Allergies  Allergen Reactions   Morphine Itching   Social History   Socioeconomic History   Marital status: Single    Spouse name: Not on file   Number of children: Not on file   Years of education: Not on file   Highest education level: Not on file  Occupational History   Not on file  Tobacco Use   Smoking status: Every Day    Types: Cigarettes   Smokeless tobacco: Never   Tobacco comments:    5-6 daily  Vaping Use   Vaping Use: Never used  Substance and Sexual Activity   Alcohol use: Not Currently    Comment: Very rare   Drug use: Not Currently    Types: Marijuana   Sexual activity: Not on file  Other  Topics Concern   Not on file  Social History Narrative   Not on file   Social Determinants of Health   Financial Resource Strain: High Risk   Difficulty of Paying Living Expenses: Very hard  Food Insecurity: No Food Insecurity   Worried About Running Out of Food in the Last Year: Never true   Ran Out of Food in the Last Year: Never true  Transportation Needs: Unmet Transportation Needs   Lack of Transportation (Medical): Yes   Lack of Transportation (Non-Medical): Yes  Physical Activity: Not on file  Stress: Not on file  Social Connections: Not on file  Intimate Partner Violence: Not on file   Family History  Problem Relation Age of Onset   Hypertension Mother    Heart failure Father    Hypertension Brother    Congestive Heart Failure Brother     Vitals BP 120/78 (BP Location: Left Arm)   Pulse 98   Temp 98.2 F (36.8 C) (Oral)   Resp 18   Ht 6\' 3"  (1.905 m)   Wt 110.4 kg   SpO2 96%   BMI 30.42 kg/m   Physical Exam Constitutional:  Looks older than his stated age, not in acute distress    Comments:   Cardiovascular:     Rate and Rhythm: Normal rate and regular rhythm.     Heart sounds:   Pulmonary:     Effort: Pulmonary effort is normal.     Comments: on room air, clear lung sounds  bilaterally  Abdominal:     Palpations: Abdomen is soft.     Tenderness: Non tender and non distended   Musculoskeletal:        General: Left leg is bandaged entirely, small wound at the base of little toe in the rt foot.                     Skin:    Comments: No lesions or rashes   Neurological:     General: No focal deficit present.   Psychiatric:        Mood and Affect: Mood normal. Calm and cooperative    Pertinent Microbiology Results for orders placed or performed during the hospital encounter of 02/24/21  Blood culture (routine x 2)     Status: None (Preliminary result)   Collection Time: 02/24/21  5:17 PM   Specimen: BLOOD  Result Value Ref Range Status   Specimen Description BLOOD SITE NOT SPECIFIED  Final   Special Requests   Final    BOTTLES DRAWN AEROBIC AND ANAEROBIC Blood Culture adequate volume   Culture   Final    NO GROWTH 3 DAYS Performed at [facility], [address], [place], [place]    Report Status PENDING  Incomplete  Blood culture (routine x 2)     Status: Abnormal   Collection Time: 02/24/21  5:22 PM   Specimen: BLOOD  Result Value Ref Range Status   Specimen Description BLOOD RIGHT ANTECUBITAL  Final   Special Requests   Final    BOTTLES DRAWN AEROBIC AND ANAEROBIC Blood Culture results may not be optimal due to an inadequate volume of blood received in culture bottles   Culture  Setup Time   Final    GRAM POSITIVE COCCI IN CLUSTERS IN BOTH AEROBIC AND ANAEROBIC BOTTLES CRITICAL RESULT CALLED TO, READ BACK BY AND VERIFIED WITH: PHARM D A.LAWLESS ON 04/26/21 AT 1255 BY E.PARRISH    Culture (A)  Final  STAPHYLOCOCCUS HOMINIS STAPHYLOCOCCUS EPIDERMIDIS THE SIGNIFICANCE OF ISOLATING THIS ORGANISM FROM A SINGLE SET OF BLOOD CULTURES WHEN MULTIPLE SETS ARE DRAWN IS UNCERTAIN. PLEASE NOTIFY THE MICROBIOLOGY DEPARTMENT WITHIN ONE WEEK IF SPECIATION AND SENSITIVITIES ARE REQUIRED. Performed at [facility], [address], [place], [place] [address]    Report Status 02/27/2021 FINAL  Final  Blood Culture ID Panel (Reflexed)     Status: Abnormal   Collection Time: 02/24/21  5:22 PM  Result Value Ref Range Status   Enterococcus faecalis NOT DETECTED NOT DETECTED Final   Enterococcus Faecium NOT DETECTED NOT DETECTED Final   Listeria monocytogenes NOT DETECTED NOT DETECTED Final   Staphylococcus species DETECTED (A) NOT DETECTED Final    Comment: CRITICAL RESULT CALLED TO, READ BACK BY AND VERIFIED WITH: PHARM D A.LAWLESS ON [id number] AT 1255 BY E.PARRISH    Staphylococcus aureus (BCID) NOT DETECTED NOT DETECTED Final   Staphylococcus epidermidis DETECTED (A) NOT DETECTED Final    Comment: Methicillin (oxacillin) resistant coagulase negative staphylococcus. Possible blood culture contaminant (unless isolated from more than one blood culture draw or clinical case suggests pathogenicity). No antibiotic treatment is indicated for blood  culture contaminants. CRITICAL RESULT CALLED TO, READ BACK BY AND VERIFIED WITH: PHARM D A.LAWLESS ON [id number] AT 1255 BY E.PARRISH    Staphylococcus lugdunensis NOT DETECTED NOT DETECTED Final   Streptococcus species NOT DETECTED NOT DETECTED Final   Streptococcus agalactiae NOT DETECTED NOT DETECTED Final   Streptococcus pneumoniae NOT DETECTED NOT DETECTED Final   Streptococcus pyogenes NOT DETECTED NOT DETECTED Final   A.calcoaceticus-baumannii NOT DETECTED NOT DETECTED Final   Bacteroides fragilis NOT DETECTED NOT DETECTED Final   Enterobacterales NOT DETECTED NOT DETECTED Final   Enterobacter cloacae complex NOT DETECTED NOT DETECTED Final   Escherichia coli NOT DETECTED NOT DETECTED Final   Klebsiella aerogenes NOT DETECTED NOT DETECTED Final   Klebsiella oxytoca NOT DETECTED NOT DETECTED Final   Klebsiella pneumoniae NOT DETECTED NOT DETECTED Final   Proteus species NOT DETECTED NOT DETECTED Final   Salmonella species NOT DETECTED NOT DETECTED Final   Serratia  marcescens NOT DETECTED NOT DETECTED Final   Haemophilus influenzae NOT DETECTED NOT DETECTED Final   Neisseria meningitidis NOT DETECTED NOT DETECTED Final   Pseudomonas aeruginosa NOT DETECTED NOT DETECTED Final   Stenotrophomonas maltophilia NOT DETECTED NOT DETECTED Final   Candida albicans NOT DETECTED NOT DETECTED Final   Candida auris NOT DETECTED NOT DETECTED Final   Candida glabrata NOT DETECTED NOT DETECTED Final   Candida krusei NOT DETECTED NOT DETECTED Final   Candida parapsilosis NOT DETECTED NOT DETECTED Final   Candida tropicalis NOT DETECTED NOT DETECTED Final   Cryptococcus neoformans/gattii NOT DETECTED NOT DETECTED Final   Methicillin resistance mecA/C DETECTED (A) NOT DETECTED Final    Comment: CRITICAL RESULT CALLED TO, READ BACK BY AND VERIFIED WITH: PHARM D A.LAWLESS ON [id number] AT 1255 BY E.PARRISH Performed at [facility], [address] [place], [place] [address]   Aerobic/Anaerobic Culture w Gram Stain (surgical/deep wound)     Status: None (Preliminary result)   Collection Time: 02/24/21 10:54 PM   Specimen: Soft Tissue, Other  Result Value Ref Range Status   Specimen Description ABSCESS LEFT FOOT  Final   Special Requests VANCOMYCIN,ZOSYN  Final   Gram Stain   Final    RARE WBC PRESENT,BOTH PMN AND MONONUCLEAR FEW GRAM POSITIVE COCCI IN PAIRS    Culture   Final    ABUNDANT STREPTOCOCCUS AGALACTIAE  TESTING AGAINST S. AGALACTIAE NOT ROUTINELY PERFORMED DUE TO PREDICTABILITY OF AMP/PEN/VAN SUSCEPTIBILITY. RARE STAPHYLOCOCCUS AUREUS CULTURE REINCUBATED FOR BETTER GROWTH Performed at [facility], [address] [place], [place] [address]    Report Status PENDING  Incomplete  Aerobic/Anaerobic Culture w Gram Stain (surgical/deep wound)     Status: None (Preliminary result)   Collection Time: 02/26/21  3:36 PM   Specimen: Wound; Tissue  Result Value Ref Range Status   Specimen Description TISSUE  Final   Special Requests RIGHT LEG WOUND  TISSUE SPEC A  Final   Gram Stain   Final    FEW WBC PRESENT, PREDOMINANTLY PMN NO ORGANISMS SEEN Performed at [facility], [address] [place], [place] [address]    Culture PENDING  Incomplete   Report Status PENDING  Incomplete    Pertinent Lab seen by me: CBC Latest Ref Rng & Units 02/27/2021 02/26/2021 02/26/2021  WBC 4.0 - 10.5 K/uL 18.8(H) 19.0(H) 15.5(H)  Hemoglobin 13.0 - 17.0 g/dL 7.0(L) 7.1(L) 8.4(L)  Hematocrit 39.0 - 52.0 % 21.8(L) 22.0(L) 26.0(L)  Platelets 150 - 400 K/uL 305 314 271   CMP Latest Ref Rng & Units 02/27/2021 02/26/2021 02/25/2021  Glucose 70 - 99 mg/dL 914(N) 829(F) 621(H)  BUN 6 - 20 mg/dL 08(M) 57(Q) 46(N)  Creatinine 0.61 - 1.24 mg/dL 6.29(B) 2.84(X) 3.24(M)  Sodium 135 - 145 mmol/L 130(L) 131(L) 131(L)  Potassium 3.5 - 5.1 mmol/L 4.1 3.7 3.8  Chloride 98 - 111 mmol/L 101 97(L) 97(L)  CO2 22 - 32 mmol/L 18(L) 22 21(L)  Calcium 8.9 - 10.3 mg/dL 7.8(L) 7.8(L) 8.2(L)  Total Protein 6.5 - 8.1 g/dL - - -  Total Bilirubin 0.3 - 1.2 mg/dL - - -  Alkaline Phos 38 - 126 U/L - - -  AST 15 - 41 U/L - - -  ALT 0 - 44 U/L - - -     Pertinent Imagings/Other Imagings Plain films and CT images have been personally visualized and interpreted; radiology reports have been reviewed. Decision making incorporated into the Impression / Recommendations.  Rt foot xray 02/25/21  FINDINGS: Soft tissue swelling cannot be excluded. No foreign body. Prior right fifth transmetatarsal amputation. No acute bony abnormality. No bony erosions. Mild degenerative changes first MTP joint. Mild calcaneal spurring.   IMPRESSION: Soft tissue swelling cannot be excluded. No radiopaque foreign body. Transmetatarsal amputation right fifth digit. No acute or focal bony abnormality. No bony erosions.  Left ankle/foot and tibia/fibula  Xray 02/24/21 FINDINGS: No fracture or dislocation of the left tibia or fibula, ankle, or foot. Diffuse soft tissue edema about the lower leg, ankle,  and foot. There are subcutaneous soft tissue gas loculations about the dorsum of the foot, predominantly overlying the distal fourth and fifth metatarsals. No bony erosion or sclerosis to suggest osteomyelitis.   IMPRESSION: 1. Diffuse soft tissue edema about the lower leg, ankle, and foot. 2. There are subcutaneous soft tissue gas loculations about the dorsum of the foot, predominantly overlying the distal fourth and fifth metatarsals. Findings are concerning for gas-forming infection. 3. No bony erosion or sclerosis to suggest osteomyelitis. MRI is the most sensitive test for the detection of bone marrow edema and osteomyelitis if clinically suspected.  Chest Xray 02/24/21 FINDINGS: The heart is normal in size.The cardiomediastinal contours are normal. The lungs are clear. Pulmonary vasculature is normal. No consolidation, pleural effusion, or pneumothorax. No acute osseous abnormalities are seen.   IMPRESSION: Unremarkable radiographs of the chest.  TTE 02/20/21  1. Anterior, anteroseptal and inferoseptal hypokinesis. Left ventricular ejection fraction, by estimation, is 30 to 35%. The left ventricle has moderately decreased function. The left ventricle demonstrates regional wall motion abnormalities (see scoring diagram/findings for description). There is mild concentric left ventricular hypertrophy. Left ventricular diastolic parameters are consistent with Grade I diastolic dysfunction (impaired relaxation). 2. Right ventricular systolic function is normal. The right ventricular size is normal. There is normal pulmonary artery systolic pressure. 3. The mitral valve is normal in structure. Trivial mitral valve regurgitation. No evidence of mitral stenosis. 4. The aortic valve is tricuspid. Aortic valve regurgitation is trivial. No aortic stenosis is present. 5. Aortic dilatation noted. There is borderline dilatation of the aortic root and of the ascending aorta, measuring 36  mm. 6. The inferior vena cava is normal in size with greater than 50% respiratory variability, suggesting right atrial pressure of 3 mmHg.  MRI left tibia/fibula 02/25/21   IMPRESSION: Extensive inflammatory changes within the anterior compartment of the lower leg, with myositis and marked thickening of the intermuscular fascia with high-signal intensity fluid primarily along the extensor digitorum muscle, concerning for infectious myositis and fasciitis.   Appearance of surgical packing material noted along in anterior incision just above the ankle, correlate with operative details.   Additional intramuscular edema to a lesser degree in the lateral and deep posterior compartments which is likely reactive.   No evidence of osteomyelitis.   MRI Left ankle 02/25/21 IMPRESSION: Recent incision and debridement/drainage along the dorsolateral forefoot, with appearance of surgical packing material extending along the course of the extensor digitorum tendon, correlate with operative details. Periarticular bone marrow edema at the fifth metatarsophalangeal joint with small joint effusion, concerning for septic arthritis and osteomyelitis. There are adjacent small pockets of fluid which could be small abscesses.   Additional bony edema in the distal fourth metatarsal, to a lesser degree than the fifth, partially visualized and may be reactive or early osteomyelitis.   Focal tenosynovitis of the tibialis anterior tendon and extensor digitorum tendon at the level of the ankle.   Mild tenosynovitis of the peroneal longus and brevis tendons and posterior tibial tendon above and below the lateral and medial malleoli.   Extensive soft tissue swelling of the foot and ankle with intramuscular edema which could represent myositis.   I spent more than 70  minutes for this patient encounter including review of prior medical records/discussing diagnostics and treatment plan with the  patient/family/coordinate care with primary/other specialits with greater than 50% of time in face to face encounter.   Electronically signed by:   Odette FractionSabina Malvin Morrish, MD Infectious Disease Physician [facility]  [facility] Pager: [phone]

## 2021-02-27 NOTE — Progress Notes (Signed)
Patient ID: Adam James, male   DOB: 19-Oct-1970, 50 y.o.   MRN: [id number] Patient is seen postoperative day 1 for limb salvage intervention for the left leg.  Patient had massive necrotizing fasciitis from the metatarsal heads extending all the way to the proximal aspect of the anterior compartment.  All the muscles in the anterior lateral compartment were dead the fascia was infected.  Patient underwent excision of the anterior compartment and fascia of the anterior lateral compartment.  The anterior tibial vessels were intact.  Tissue was sent for cultures.  Discussed with patient we can proceed with limb salvage intervention with repeat debridement by Dr. Roda Shutters on Monday or Tuesday and then further debridement by myself a week from tomorrow.  Discussed that with limb salvage intervention if it is a success he will have no dorsiflexion of the ankle will have a foot drop and will have no extension of his toes.  Patient also will develop a supination equinus contracture.  Patient states he does not want to consider an amputation and wants to proceed with limb salvage intervention.  The installation wound VAC is functioning well.  I changed out the drainage canister and the installation saline was being changed out.  The wound VAC was plugged into the wall it was not getting In Charge plugged into the bed.  Tissue cultures are pending.  Necrotic muscle, necrotic fascia, and purulent abscess was sent for tissue cultures.

## 2021-02-27 NOTE — Progress Notes (Signed)
Physical Therapy Treatment Patient Details Name: Adam James MRN: [id number] DOB: August 18, 1970 Today's Date: 02/27/2021    History of Present Illness Pt is a 50 y.o. male admitted 02/24/21 with bilateral foot wounds. Pt with L foot diabetic infection with gas gangrene, R foot diabetic ulcer. Ortho workup reports LLE diabetic insensate neuropathy with necrotizing fasciitis involving the L anterior/lateral compartments and dorsum of L foot. S/p L foot I&D 8/1. S/p additional L foot and lower leg I&D with wound vac placement 8/3. Plan for LLE limb salvage with serial debridements next week. PMH includes DM2, neuropathy, CAD, cardiomyopathy.   PT Comments    Pt limited by pain and frustrated by expectations to start mobilizing day after surgery in spite of pain. Pt tolerated minimal LLE therex/ROM. Increased time educ re: outlook for LLE motion and future ambulation (i.e. orthotic wear), WB precautions, edema control, potential DME needs, importance of early mobilization, risks of staying in bed until procedure next week. Pt adamantly against putting any weight through LLE "if it's hurting" despite WBAT precautions - therefore, educ pt on multiple transfer options to put little to no weight through LLE; pt still declines initiation of this. Pt reports agreeable to w/c transfers only next session. Will continue to progress activity as pt will allow.    Follow Up Recommendations  TBD - would likely benefit from post-acute rehab, but unsure until pt starts mobilizing more     Equipment Recommendations  TBD - likely a Rolling walker with 5" wheels, 3in1, Wheelchair    Recommendations for Other Services       Precautions / Restrictions Precautions Precautions: Fall;Other (comment) Precaution Comments: LLE wound vac Restrictions Weight Bearing Restrictions: Yes RLE Weight Bearing: Weight bearing as tolerated LLE Weight Bearing: Weight bearing as tolerated    Mobility  Bed Mobility Overal bed  mobility: Modified Independent             General bed mobility comments: Mod indep to come to long sitting from laying near flat; declined sitting EOB    Transfers                 General transfer comment: Adamantly declined secondary to pain. Pt reports, "There's no way I'm standing on that foot if it's hurting." Explained multiple transfer options (i.e. lateral scoot, AP transfer) to put little or no weight through LLE (although WBAT encouraged), pt still declines attempt today  Ambulation/Gait                 Stairs             Wheelchair Mobility    Modified Rankin (Stroke Patients Only)       Balance                                            Cognition Arousal/Alertness: Awake/alert Behavior During Therapy: WFL for tasks assessed/performed;Agitated Overall Cognitive Status: No family/caregiver present to determine baseline cognitive functioning                                 General Comments: WFL cognitively, although agitated and "annoyed" with situation and PT suggesting mobility ("I've been in the hospital a month before and no one has ever made me get up like this"); pt continues to have poor insight into importance of mobility/need for  PT despite prolonged education and education, pt does not appear receptive of information      Exercises Other Exercises Other Exercises: Long sitting for hamstring stretch (unable to fully straighten knee), L calf stretch with strap (pt able to place gait belt himself for stretch, tolerated ~20-sec hold) - educ on frequency for this, pt states, "I'll give it a try"    General Comments General comments (skin integrity, edema, etc.): Increased time educ re: importance of mobility, WB precautions, risk for LLE contractures and long-term options for AFO wear etc., edema control (elevation, AROM), transfer options, DME needs, role of acute PT. Pt reports, "I'm going to make it work  at home without putting any weight on that leg if it's hurting... I'll go up the stairs on my butt..." - tried to realistically explain what functional mobility might look like at home for pt and explain the fact that we ideally start working towards all of this now; pt reports agreeable to w/c transfers tomorrow      Pertinent Vitals/Pain Pain Assessment: 0-10 Pain Score: 8  Pain Location: L lower leg Pain Descriptors / Indicators: Grimacing;Guarding;Discomfort Pain Intervention(s): Repositioned;Premedicated before session (elevated on pillows (pt with limited tolerance to this "pressure"))    Home Living                      Prior Function            PT Goals (current goals can now be found in the care plan section) Acute Rehab PT Goals Patient Stated Goal: Decrease pain PT Goal Formulation: With patient Time For Goal Achievement: 03/12/21 Potential to Achieve Goals: Fair Progress towards PT goals: Not progressing toward goals - comment (self-limiting, limited by pain)    Frequency    Min 3X/week      PT Plan Current plan remains appropriate    Co-evaluation              AM-PAC PT "6 Clicks" Mobility   Outcome Measure  Help needed turning from your back to your side while in a flat bed without using bedrails?: None Help needed moving from lying on your back to sitting on the side of a flat bed without using bedrails?: None Help needed moving to and from a bed to a chair (including a wheelchair)?: A Little Help needed standing up from a chair using your arms (e.g., wheelchair or bedside chair)?: A Little Help needed to walk in hospital room?: A Lot Help needed climbing 3-5 steps with a railing? : A Lot 6 Click Score: 18    End of Session   Activity Tolerance: Patient limited by pain Patient left: in bed;with call bell/phone within reach Nurse Communication: Mobility status PT Visit Diagnosis: Other abnormalities of gait and mobility  (R26.89);Pain Pain - Right/Left: Left Pain - part of body: Ankle and joints of foot     Time: 4098-1191 PT Time Calculation (min) (ACUTE ONLY): 28 min  Charges:  $Therapeutic Exercise: 8-22 mins $Self Care/Home Management: 8-22                     Ina Homes, PT, DPT Acute Rehabilitation Services  Pager [phone] Office [phone]  Malachy Chamber 02/27/2021, 3:39 PM

## 2021-02-28 ENCOUNTER — Other Ambulatory Visit: Payer: Self-pay | Admitting: Physician Assistant

## 2021-02-28 LAB — BASIC METABOLIC PANEL
Anion gap: 9 (ref 5–15)
BUN: 28 mg/dL — ABNORMAL HIGH (ref 6–20)
CO2: 18 mmol/L — ABNORMAL LOW (ref 22–32)
Calcium: 7.5 mg/dL — ABNORMAL LOW (ref 8.9–10.3)
Chloride: 103 mmol/L (ref 98–111)
Creatinine, Ser: 1.78 mg/dL — ABNORMAL HIGH (ref 0.61–1.24)
GFR, Estimated: 46 mL/min — ABNORMAL LOW (ref >60)
Glucose, Bld: 241 mg/dL — ABNORMAL HIGH (ref 70–99)
Potassium: 3.8 mmol/L (ref 3.5–5.1)
Sodium: 130 mmol/L — ABNORMAL LOW (ref 135–145)

## 2021-02-28 LAB — HEMOGLOBIN AND HEMATOCRIT, BLOOD
HCT: 25.8 % — ABNORMAL LOW (ref 39.0–52.0)
Hemoglobin: 8.6 g/dL — ABNORMAL LOW (ref 13.0–17.0)

## 2021-02-28 LAB — GLUCOSE, CAPILLARY
Glucose-Capillary: 226 mg/dL — ABNORMAL HIGH (ref 70–99)
Glucose-Capillary: 252 mg/dL — ABNORMAL HIGH (ref 70–99)
Glucose-Capillary: 221 mg/dL — ABNORMAL HIGH (ref 70–99)
Glucose-Capillary: 244 mg/dL — ABNORMAL HIGH (ref 70–99)

## 2021-02-28 LAB — CBC
HCT: 23.7 % — ABNORMAL LOW (ref 39.0–52.0)
Hemoglobin: 7.9 g/dL — ABNORMAL LOW (ref 13.0–17.0)
MCH: 28.9 pg (ref 26.0–34.0)
MCHC: 33.3 g/dL (ref 30.0–36.0)
MCV: 86.8 fL (ref 80.0–100.0)
Platelets: 253 10*3/uL (ref 150–400)
RBC: 2.73 MIL/uL — ABNORMAL LOW (ref 4.22–5.81)
RDW: 14.6 % (ref 11.5–15.5)
WBC: 12.3 10*3/uL — ABNORMAL HIGH (ref 4.0–10.5)
nRBC: 0 % (ref 0.0–0.2)

## 2021-02-28 LAB — PREPARE RBC (CROSSMATCH)

## 2021-02-28 MED ORDER — METOPROLOL SUCCINATE ER 25 MG PO TB24
12.5000 mg | ORAL_TABLET | Freq: Once | ORAL | Status: AC
  Administered 2021-02-28: 12.5 mg via ORAL
  Filled 2021-02-28: qty 1

## 2021-02-28 MED ORDER — INSULIN GLARGINE-YFGN 100 UNIT/ML ~~LOC~~ SOLN
15.0000 [IU] | Freq: Every day | SUBCUTANEOUS | Status: DC
  Administered 2021-02-28 – 2021-03-01 (×2): 15 [IU] via SUBCUTANEOUS
  Filled 2021-02-28 (×3): qty 0.15

## 2021-02-28 MED ORDER — METOPROLOL SUCCINATE ER 25 MG PO TB24: 12.5000 mg | ORAL_TABLET | Freq: Every day | ORAL | Status: DC

## 2021-02-28 MED ORDER — SODIUM CHLORIDE 0.9% IV SOLUTION: Freq: Once | INTRAVENOUS | Status: AC

## 2021-02-28 MED ORDER — METOPROLOL SUCCINATE ER 25 MG PO TB24
25.0000 mg | ORAL_TABLET | Freq: Every day | ORAL | Status: DC
  Administered 2021-03-01 – 2021-03-09 (×9): 25 mg via ORAL
  Filled 2021-02-28 (×9): qty 1

## 2021-02-28 NOTE — Progress Notes (Signed)
Inpatient Rehabilitation Admissions Coordinator    We will follow his participation with therapy to assess if he may be a candidate for CIR intensity post procedures next week. Self limiting with therapy at this time due to pain.   Ottie Glazier, RN, MSN Rehab Admissions Coordinator [phone] 02/28/2021 3:59 PM

## 2021-02-28 NOTE — Progress Notes (Signed)
Physical Therapy Treatment Patient Details Name: Adam James MRN: [id number] DOB: Jan 02, 1971 Today's Date: 02/28/2021    History of Present Illness Pt is a 50 y.o. male admitted 02/24/21 with bilateral foot wounds. Pt with L foot diabetic infection with gas gangrene, R foot diabetic ulcer. Ortho workup reports LLE diabetic insensate neuropathy with necrotizing fasciitis involving the L anterior/lateral compartments and dorsum of L foot. S/p L foot I&D 8/1. S/p additional L foot and lower leg I&D with wound vac placement 8/3. Plan for LLE limb salvage with serial debridements next week. PMH includes DM2, neuropathy, CAD, cardiomyopathy.   PT Comments    Pt agreeable to practice wheelchair transfer this session; continues to decline anything that involves weight going through LLE secondary to pain. Pt minimally receptive to education from this PT. Ultimately feel pt would do very well with intensive CIR-level therapies upon discharge to regain mod indep PLOF as pt will likely have difficulty with standing mobility once he is agreeable to progress to this. Pt interested in post-acute rehab, but does not want another acute PT session until after next week's procedure. Will continue to follow acutely.    Follow Up Recommendations  CIR - TBD after next sx     Equipment Recommendations  TBD - will likely need Rolling walker with 5" wheels, 3in1, Wheelchair    Recommendations for Other Services       Precautions / Restrictions Precautions Precautions: Fall;Other (comment) Precaution Comments: LLE wound vac Restrictions Weight Bearing Restrictions: Yes RLE Weight Bearing: Weight bearing as tolerated LLE Weight Bearing: Weight bearing as tolerated    Mobility  Bed Mobility Overal bed mobility: Modified Independent                  Transfers Overall transfer level: Needs assistance Equipment used: None Transfers: Squat Pivot Transfers           General transfer comment:  Performed squat pivot transfer from bed<>wheelchair with assist for set-up; pt declining education on recommended technique since "I won't have you at home to tell me how to do it", but does agree transferring towards RLE was easier; declining further transfer training secondary to pain; pt putting little to no weight through RLE  Ambulation/Gait                 Stairs             Wheelchair Mobility    Modified Rankin (Stroke Patients Only)       Balance Overall balance assessment: Needs assistance Sitting-balance support: Feet supported;No upper extremity supported Sitting balance-[name] Scale: Good                                      Cognition Arousal/Alertness: Awake/alert Behavior During Therapy: WFL for tasks assessed/performed;Agitated Overall Cognitive Status: No family/caregiver present to determine baseline cognitive functioning                                 General Comments: WFL cognitively; not receptive to feedback or suggestions regarding mobility (when asked if he was open therapist's suggestions/education for wheelchair transfer, pt asking therapist to stay quiet)      Exercises      General Comments General comments (skin integrity, edema, etc.): Not receptive to education throughout session; continues to adamantly decline standing attempts, ambulation, or anything that involves putting  weight on RLE secondary to significant pain; declines LLE therex/ROM; pt not interested in doffing PRAFO or attempts at further DF ankle. Pt aware he is going to have a difficult time with mobility at home, reports interest in pursuing post-acute rehab options (SW notified)      Pertinent Vitals/Pain Pain Assessment: Faces Faces Pain Scale: Hurts whole lot Pain Location: L lower leg Pain Descriptors / Indicators: Grimacing;Guarding;Discomfort;Moaning Pain Intervention(s): Monitored during session;Premedicated before session;Limited  activity within patient's tolerance;Repositioned    Home Living                      Prior Function            PT Goals (current goals can now be found in the care plan section) Progress towards PT goals: Progressing toward goals    Frequency    Min 3X/week      PT Plan Current plan remains appropriate    Co-evaluation              AM-PAC PT "6 Clicks" Mobility   Outcome Measure  Help needed turning from your back to your side while in a flat bed without using bedrails?: None Help needed moving from lying on your back to sitting on the side of a flat bed without using bedrails?: None Help needed moving to and from a bed to a chair (including a wheelchair)?: A Little Help needed standing up from a chair using your arms (e.g., wheelchair or bedside chair)?: A Lot Help needed to walk in hospital room?: A Lot Help needed climbing 3-5 steps with a railing? : A Lot 6 Click Score: 17    End of Session   Activity Tolerance: Patient limited by pain Patient left: in bed;with call bell/phone within reach Nurse Communication: Mobility status PT Visit Diagnosis: Other abnormalities of gait and mobility (R26.89);Pain Pain - Right/Left: Left Pain - part of body: Ankle and joints of foot     Time: 1246-1313 PT Time Calculation (min) (ACUTE ONLY): 27 min  Charges:  $Therapeutic Activity: 23-37 mins                     Ina Homes, PT, DPT Acute Rehabilitation Services  Pager [phone] Office [phone]  Malachy Chamber 02/28/2021, 2:04 PM

## 2021-02-28 NOTE — Plan of Care (Signed)
  Problem: Clinical Measurements: Goal: Diagnostic test results will improve Outcome: Progressing   Problem: Activity: Goal: Risk for activity intolerance will decrease Outcome: Progressing   Problem: Pain Managment: Goal: General experience of comfort will improve Outcome: Progressing   Problem: Safety: Goal: Ability to remain free from injury will improve Outcome: Progressing   

## 2021-02-28 NOTE — TOC Progression Note (Addendum)
Transition of Care [facility]) - Progression Note    Patient Details  Name: Adam James MRN: [id number] Date of Birth: 1971/02/24  [facility] CM/SW Contact  Leone Haven, RN Phone Number: 02/28/2021, 7:55 AM  Clinical Narrative:    Patient is s/p I and D for necrotizing fasciitis on the left, wound vac has been placed.  NCM asked PA if patient will be going home with wound  VAC, looks like will be returning to OR on Monday. Waiting for response. Per Persons PA, patient will be going home with a preveena (incisional ) wound vac which will be placed after next surgery.         Expected Discharge Plan and Services                                                 Social Determinants of Health (SDOH) Interventions    Readmission Risk Interventions No flowsheet data found.

## 2021-02-28 NOTE — Progress Notes (Signed)
HD#5 SUBJECTIVE:  Patient Summary: Adam James is a 50 y.o. with a pertinent PMH of diabetes, ischemic heart disease, chronic heart failure with reduced EF, and CKD IIIa who presented s/p irrigation and debridement for left foot diabetic infection with gas gangrene.  Overnight Events: No acute events overnight reported.  Interim History: This is hospital day 5 for Adam James who was seen and evaluated at the bedside this morning. He states that he has some pain in his left leg, however, it is well controlled with his pain medications. He notes that he is mentally have a difficult time processing everything that has happened to him over the course of his hospitalization, which is limiting his ability to work with PT and to move around.   OBJECTIVE:  Vital Signs: Vitals:   02/28/21 0025 02/28/21 0046 02/28/21 0342 02/28/21 0729  BP: 106/71 103/68 113/75 130/68  Pulse: 95 91 88 87  Resp: 20 18 15 18   Temp: 98.1 F (36.7 C) 99.1 F (37.3 C) 98.4 F (36.9 C) 98 F (36.7 C)  TempSrc: Oral Oral Oral Oral  SpO2: 96% 97% 97% 99%  Weight:   112.4 kg   Height:       Supplemental O2: Room Air SpO2: 99%  Filed Weights   02/26/21 0427 02/27/21 0306 02/28/21 0342  Weight: 111.3 kg 110.4 kg 112.4 kg     Intake/Output Summary (Last 24 hours) at 02/28/2021 0740 Last data filed at 02/28/2021 04/30/2021 Gross per 24 hour  Intake 3316.14 ml  Output 2425 ml  Net 891.14 ml   Net IO Since Admission: 4,190.45 mL [02/28/21 0740]  Physical Exam: General: Patient is not in any acute distress  CV: RRR. No murmurs, rubs, or gallops. No LE edema Pulmonary: Lungs CTAB. Normal effort. No wheezing or rales. Extremities: Palpable radial and DP pulses on the right. 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 2cm ulcer on plantar aspect of R foot under the base of the 4th-5th metatarsal, surrounding skin is necrotic, however, it is wrapped in gauze. Unable to assess LLE due to post-op dressing that extends up to  patient's knee. Wound vac in place with ~400cc serosanguinous fluid output.  Skin: Warm and dry. No obvious rash or lesions. Neuro: A&Ox3. Reduced sensation in lower extremities. No focal deficit. Psych: Normal mood and affect give patient situation   Patient Lines/Drains/Airways Status     Active Line/Drains/Airways     Name Placement date Placement time Site Days   Peripheral IV 02/24/21 20 G Left Antecubital 02/24/21  1744  Antecubital  4   Peripheral IV 02/26/21 Left;Posterior Forearm 02/26/21  1824  Forearm  2   Open Drain 2 Left;Anterior Foot 0.1 Fr. 02/24/21  2325  Foot  4   Negative Pressure Wound Therapy Leg Left 02/26/21  1550  --  2   Incision (Closed) 02/24/21 Leg Left 02/24/21  2326  -- 4   Incision (Closed) 02/26/21 Leg Left 02/26/21  1534  -- 2   Wound / Incision (Open or Dehisced) 02/25/21 Diabetic ulcer Foot Right 02/25/21  0130  Foot  3   Wound / Incision (Open or Dehisced) 02/25/21 Diabetic ulcer Foot Right 02/25/21  0130  Foot  3            Pertinent Labs: CBC Latest Ref Rng & Units 02/28/2021 02/27/2021 02/27/2021  WBC 4.0 - 10.5 K/uL 12.3(H) - 18.8(H)  Hemoglobin 13.0 - 17.0 g/dL 7.9(L) 7.0(L) 7.0(L)  Hematocrit 39.0 - 52.0 % 23.7(L) 21.0(L) 21.8(L)  Platelets  150 - 400 K/uL 253 - 305    CMP Latest Ref Rng & Units 02/28/2021 02/27/2021 02/26/2021  Glucose 70 - 99 mg/dL 540(G) 867(Y) 195(K)  BUN 6 - 20 mg/dL 93(O) 67(T) 24(P)  Creatinine 0.61 - 1.24 mg/dL 8.09(X) 8.33(A) 2.50(N)  Sodium 135 - 145 mmol/L 130(L) 130(L) 131(L)  Potassium 3.5 - 5.1 mmol/L 3.8 4.1 3.7  Chloride 98 - 111 mmol/L 103 101 97(L)  CO2 22 - 32 mmol/L 18(L) 18(L) 22  Calcium 8.9 - 10.3 mg/dL 7.5(L) 7.8(L) 7.8(L)  Total Protein 6.5 - 8.1 g/dL - - -  Total Bilirubin 0.3 - 1.2 mg/dL - - -  Alkaline Phos 38 - 126 U/L - - -  AST 15 - 41 U/L - - -  ALT 0 - 44 U/L - - -    Recent Labs    02/27/21 1107 02/27/21 1611 02/28/21 0614  GLUCAP 210* 194* 226*     Pertinent Imaging: No results  found.  ASSESSMENT/PLAN:  Assessment: Principal Problem:   Gangrene of left foot (HCC) Active Problems:   Cardiomyopathy (HCC)   Type 2 diabetes mellitus (HCC)   Diabetic foot ulcer (HCC)   CAD (coronary artery disease)   Necrotizing fasciitis (HCC)   Left leg cellulitis   Diabetic polyneuropathy associated with type 2 diabetes mellitus (HCC)   Severe protein-calorie malnutrition (HCC)   Plan: #Left sided diabetic foot infection with gas gangrene and necrotizing fasciitis Patient is postoperative day 1 for limb salvage intervention of the left leg where diagnosis of necrotizing fasciitis was confirmed.  A wound vac was placed and noted to have ~400cc of serosanguinous fluid this morning upon examination. Leukocytosis is improved and the patient continues to receive IV antibiotics, which were switched yesterday per ID. Fungal cultures from 8/1 are negative and tissue cultures from 8/3 grew strep agalactiae, as the first cultures did too. - ID consulted, appreciate recommendations - Switched from vanc/cefepime to vanc/unasyn yon 8/4 - Will return to the OR next week with ortho for further debridement - Monitor wound vac output - Prevalon boot placed to reduce risk of ankle contractures  #Right sided diabetic foot ulcer Patient does not have any pain in his right foot. Pulses are palpable and the wound is still wrapped in gauze. - Wound care consulted, appreciate recommendations - Continue with dressing changes  #Type 2 Diabetes with diabetic neuropathy HbA1c 8.8. Patient received 12U Lantus with SSI yesterday. Blood sugars been between 220s-250s. - Increase insulin glargine to 15 units qhs with SSI - Continue CBG monitoring   #Chronic HFrEF #Ischemic heart disease Patient continues to deny any chest pain, SOB, orthopnea, or lower extremity edema. Home medications were initially held in the setting of hypotension. He received 3 units of PRBCs yesterday due to low hemoglobin levels.   - Continue to hold lasix, irbesartan, and amlodipine - Increased metoprolol to home dose of 25 mg daily  - Continue atorvastatin and aspirin - Continue to monitor CBC, transfuse if Hb <8  #Acute on chronic renal failure Patient has CKD IIIa at his baseline and initially had worsening of his kidney function upon admission. Cr continues to improve - Continue to monitor renal function - Avoid nephrotoxic agents  Best Practice: Diet: Carb modified diet IVF: None VTE: rivaroxaban (XARELTO) tablet 10 mg HELD Code: Full AB: Vanc/Cefepime Therapy Recs: PT/OT recommending CIR DISPO: Anticipated discharge pending  clinical improvement .  Signature: Elza Rafter, D.O.  Internal Medicine Resident, PGY-1 [facility]  Pager: #  [phone] 7:40 AM, 02/28/2021   Please contact the on call pager after 5 pm and on weekends at [phone].

## 2021-02-28 NOTE — Progress Notes (Signed)
Patient is status post irrigation and debridement of leg wound for necrotizing fasciitis on the left.  He is alert awake appropriate to exam and conversation  Vital signs are stable installation VAC is working well.    Will write preop orders plan for return to operating room with Dr. Roda Shutters on Monday

## 2021-02-28 NOTE — Progress Notes (Signed)
PT Cancellation Note  Patient Details Name: Adam James MRN: [id number] DOB: [birth year]   Cancelled Treatment:    Reason Eval/Treat Not Completed: Patient declined, no reason specified, "Oh yeah, I'm not doing that before lunch." Will follow-up for PT treatment as schedule permits.  Ina Homes, PT, DPT Acute Rehabilitation Services  Pager [phone] Office [phone]  Malachy Chamber 02/28/2021, 8:45 AM

## 2021-02-28 NOTE — Progress Notes (Signed)
Pharmacy Antibiotic Note  Adam James is a 50 y.o. male admitted on 02/24/2021 with LLE gas gangrene.  Pharmacy has been consulted for Vancomycin dosing.   Zosyn changed to cefepime which was changed to unasyn. Scr improving from 2.4 to 1.78 (CrCl 68 mL/min). WBC 12.3, afebrile. Wound cx growing abundant strep agalactiae and rare staph aureus - awaiting sensitivities. Will hold on levels until cx results.   Plan: Vancomycin 1250 mg IV q24h (AUC 500 scr 2.16) Unasyn 3g IV every 6 hours  Monitor renal fx, cx results, clinical pic   Height: 6\' 3"  (190.5 cm) Weight: 112.4 kg (247 lb 12.8 oz) IBW/kg (Calculated) : 84.57F/U renal function  Temp (24hrs), Avg:98.3 F (36.8 C), Min:97.9 F (36.6 C), Max:99.1 F (37.3 C)  Recent Labs  Lab 02/24/21 1708 02/24/21 1802 02/24/21 1926 02/25/21 0350 02/26/21 0210 02/26/21 2210 02/27/21 0116 02/28/21 0539  WBC 25.5*  --   --  21.2* 15.5* 19.0* 18.8* 12.3*  CREATININE 2.40*  --   --  2.16* 2.16*  --  2.00* 1.78*  LATICACIDVEN  --  2.5* 1.0  --   --   --   --   --      Estimated Creatinine Clearance: 68.7 mL/min (A) (by C-G formula based on SCr of 1.78 mg/dL (H)).    Allergies  Allergen Reactions   Morphine Itching   04/30/21, PharmD, BCCCP Clinical Pharmacist  Phone: [phone] 02/28/2021 11:46 AM  Please check AMION for all [facility] Pharm[facility] phone numbers After 10:00 PM, call Main Pharmacy [phone]

## 2021-03-01 LAB — CBC
HCT: 26.3 % — ABNORMAL LOW (ref 39.0–52.0)
Hemoglobin: 8.5 g/dL — ABNORMAL LOW (ref 13.0–17.0)
MCH: 28.2 pg (ref 26.0–34.0)
MCHC: 32.3 g/dL (ref 30.0–36.0)
MCV: 87.4 fL (ref 80.0–100.0)
Platelets: 323 10*3/uL (ref 150–400)
RBC: 3.01 MIL/uL — ABNORMAL LOW (ref 4.22–5.81)
RDW: 14.7 % (ref 11.5–15.5)
WBC: 11.1 10*3/uL — ABNORMAL HIGH (ref 4.0–10.5)
nRBC: 0 % (ref 0.0–0.2)

## 2021-03-01 LAB — BASIC METABOLIC PANEL
Anion gap: 8 (ref 5–15)
BUN: 25 mg/dL — ABNORMAL HIGH (ref 6–20)
CO2: 21 mmol/L — ABNORMAL LOW (ref 22–32)
Calcium: 7.9 mg/dL — ABNORMAL LOW (ref 8.9–10.3)
Chloride: 103 mmol/L (ref 98–111)
Creatinine, Ser: 1.77 mg/dL — ABNORMAL HIGH (ref 0.61–1.24)
GFR, Estimated: 47 mL/min — ABNORMAL LOW (ref >60)
Glucose, Bld: 189 mg/dL — ABNORMAL HIGH (ref 70–99)
Potassium: 3.8 mmol/L (ref 3.5–5.1)
Sodium: 132 mmol/L — ABNORMAL LOW (ref 135–145)

## 2021-03-01 LAB — TYPE AND SCREEN
ABO/RH(D): A NEG
Antibody Screen: NEGATIVE
Unit division: 0
Unit division: 0
Unit division: 0

## 2021-03-01 LAB — URINALYSIS, ROUTINE W REFLEX MICROSCOPIC
Bilirubin Urine: NEGATIVE
Glucose, UA: 150 mg/dL — AB
Ketones, ur: NEGATIVE mg/dL
Leukocytes,Ua: NEGATIVE
Nitrite: NEGATIVE
Protein, ur: >=300 mg/dL — AB
RBC / HPF: >50 RBC/hpf — ABNORMAL HIGH (ref 0–5)
Specific Gravity, Urine: 1.017 (ref 1.005–1.030)
pH: 5 (ref 5.0–8.0)

## 2021-03-01 LAB — BPAM RBC
Blood Product Expiration Date: 202208182359
Blood Product Expiration Date: 202208192359
Blood Product Expiration Date: 202208212359
ISSUE DATE / TIME: 202208040939
ISSUE DATE / TIME: 202208050018
ISSUE DATE / TIME: 202208050725
Unit Type and Rh: 600
Unit Type and Rh: 600
Unit Type and Rh: 600

## 2021-03-01 LAB — CULTURE, BLOOD (ROUTINE X 2)
Culture: NO GROWTH
Special Requests: ADEQUATE

## 2021-03-01 LAB — GLUCOSE, CAPILLARY
Glucose-Capillary: 164 mg/dL — ABNORMAL HIGH (ref 70–99)
Glucose-Capillary: 252 mg/dL — ABNORMAL HIGH (ref 70–99)
Glucose-Capillary: 256 mg/dL — ABNORMAL HIGH (ref 70–99)
Glucose-Capillary: 219 mg/dL — ABNORMAL HIGH (ref 70–99)

## 2021-03-01 LAB — CK: Total CK: 50 U/L (ref 49–397)

## 2021-03-01 MED ORDER — INSULIN ASPART 100 UNIT/ML IJ SOLN
5.0000 [IU] | Freq: Three times a day (TID) | INTRAMUSCULAR | Status: DC
  Administered 2021-03-01 – 2021-03-02 (×2): 5 [IU] via SUBCUTANEOUS

## 2021-03-01 NOTE — Progress Notes (Addendum)
HD#6 SUBJECTIVE:  Patient Summary: Adam James is a 50 y.o. 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  Overnight Events: No acute events overnight.  Interim History: This is hospital day 6 for Adam James who was seen and evaluated at the bedside this morning. He states that he is feeling the same as he did yesterday. His pain continues to be well controlled.   OBJECTIVE:  Vital Signs: Vitals:   02/28/21 1126 02/28/21 1930 02/28/21 2358 03/01/21 0427  BP: 118/79 134/80 (!) 150/79 (!) 142/93  Pulse: 84 87 91 92  Resp: 17 17 17 17   Temp: 98.6 F (37 C) 98.3 F (36.8 C) 98.4 F (36.9 C) 97.7 F (36.5 C)  TempSrc: Oral Oral Oral Oral  SpO2: 97% 98% 98% 98%  Weight:    114.6 kg  Height:       Supplemental O2: Room Air SpO2: 98%  Filed Weights   02/27/21 0306 02/28/21 0342 03/01/21 0427  Weight: 110.4 kg 112.4 kg 114.6 kg     Intake/Output Summary (Last 24 hours) at 03/01/2021 05/01/2021 Last data filed at 03/01/2021 0500 Gross per 24 hour  Intake 1638.24 ml  Output 1375 ml  Net 263.24 ml   Net IO Since Admission: 4,453.69 mL [03/01/21 0722]  Physical Exam: General: Patient is not in any acute distress CV: RRR. No murmurs, rubs, or gallops. No LE edema Pulmonary: Lungs CTAB. Normal effort. No wheezing or rales. Extremities: Palpable radial and DP pulses on the right. 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 2cm ulcer on plantar aspect of R foot under the base of the 4th-5th metatarsal, surrounding skin is necrotic, wrapped in gauze. Unable to assess LLE due to post-op dressing that extends up to patient's knee. Wound vac in place with ~400cc serosanguinous fluid output. Skin: Warm and dry. No obvious rash or lesions. Neuro: A&Ox3. Reduced sensation in lower extremities. No focal  deficit. Psych: Normal mood and affect give patient situation   Patient Lines/Drains/Airways Status     Active Line/Drains/Airways     Name Placement date Placement time Site Days   Peripheral IV 02/24/21 20 G Left Antecubital 02/24/21  1744  Antecubital  5   Peripheral IV 02/26/21 Left;Posterior Forearm 02/26/21  1824  Forearm  3   Open Drain 2 Left;Anterior Foot 0.1 Fr. 02/24/21  2325  Foot  5   Negative Pressure Wound Therapy Leg Left 02/26/21  1550  --  3   Incision (Closed) 02/24/21 Leg Left 02/24/21  2326  -- 5   Incision (Closed) 02/26/21 Leg Left 02/26/21  1534  -- 3   Wound / Incision (Open or Dehisced) 02/25/21 Diabetic ulcer Foot Right 02/25/21  0130  Foot  4   Wound / Incision (Open or Dehisced) 02/25/21 Diabetic ulcer Foot Right 02/25/21  0130  Foot  4            Pertinent Labs: CBC Latest Ref Rng & Units 03/01/2021 02/28/2021 02/28/2021  WBC 4.0 - 10.5 K/uL 11.1(H) - 12.3(H)  Hemoglobin 13.0 - 17.0 g/dL 04/30/2021) 6.2(I) 7.9(L)  Hematocrit 39.0 - 52.0 % 26.3(L) 25.8(L) 23.7(L)  Platelets 150 - 400 K/uL 323 - 253    CMP Latest Ref Rng & Units 03/01/2021 02/28/2021 02/27/2021  Glucose 70 - 99 mg/dL 04/29/2021) 546(E) 703(J)  BUN 6 - 20 mg/dL 009(F) 81(W) 29(H)  Creatinine 0.61 - 1.24 mg/dL 4.01(U) 2.72(Z) 3.66(Y)  Sodium 135 - 145 mmol/L 132(L) 130(L) 130(L)  Potassium 3.5 - 5.1 mmol/L 3.8 3.8 4.1  Chloride 98 - 111 mmol/L 103 103 101  CO2 22 - 32 mmol/L 21(L) 18(L) 18(L)  Calcium 8.9 - 10.3 mg/dL 7.9(L) 7.5(L) 7.8(L)  Total Protein 6.5 - 8.1 g/dL - - -  Total Bilirubin 0.3 - 1.2 mg/dL - - -  Alkaline Phos 38 - 126 U/L - - -  AST 15 - 41 U/L - - -  ALT 0 - 44 U/L - - -    Recent Labs    02/28/21 1652 02/28/21 2109 03/01/21 0550  GLUCAP 221* 244* 164*     Pertinent Imaging: No results found.  ASSESSMENT/PLAN:  Assessment: Principal Problem:   Gangrene of left foot (HCC) Active Problems:   Cardiomyopathy (HCC)   Type 2 diabetes mellitus (HCC)   Diabetic foot ulcer  (HCC)   CAD (coronary artery disease)   Necrotizing fasciitis (HCC)   Left leg cellulitis   Diabetic polyneuropathy associated with type 2 diabetes mellitus (HCC)   Severe protein-calorie malnutrition (HCC)   Plan: #Left sided diabetic foot infection with gas gangrene and necrotizing fasciitis Patient is postoperative day 3 for limb salvage intervention of the left leg where diagnosis of necrotizing fasciitis was confirmed.  Wound vac remains in place and noted to have ~400cc of serosanguinous fluid this morning. Leukocytosis continues to down trend and the patient continues to receive IV antibiotics.  - Unasyn, will need long term antibiotics - PICC next week after surgery - Will return to the OR next week with ortho for further debridement - Monitor wound vac output - Prevalon boot placed to reduce risk of ankle contractures - Continue to work with PT/OT as able  #Right sided diabetic foot ulcer Patient does not have any pain in his right foot. Pulses are palpable and the wound is still wrapped in gauze. Re-evaluated the wound today and it remains stable. Patient would like the wound care nurse to see him again.  - Wound care consulted, appreciate recommendations - Continue with dressing changes  #Type 2 Diabetes with diabetic neuropathy HbA1c 8.8. Patient received 15U Lantus with SSI yesterday. Blood sugars been between 160s-180s. - Insulin glargine to 15 units qhs with SSI - Continue CBG monitoring   #Chronic HFrEF #Ischemic heart disease Patient continues to deny any chest pain, SOB, orthopnea, or lower extremity edema. Home medications were initially held in the setting of hypotension. He received 3 units of PRBCs on 8/4 due to low hemoglobin levels.  - Continue to hold lasix, irbesartan, and amlodipine - Metoprolol 25 mg daily  - Restart irbesartan after next surgery if BP and renal function is doing well - Continue atorvastatin and aspirin - Continue to monitor CBC,  transfuse if Hb <8  #Acute on chronic renal failure Patient has CKD IIIa at his baseline and initially had worsening of his kidney function upon admission. Cr is overall improved and remains stable today. - Continue to monitor renal function - Avoid nephrotoxic agents  #Brown Urine: Reported by patient. No dysuria to suggest UTI. Will check UA and CK level to eval for pigment.  Best Practice: Diet: Carb modified diet IVF: None VTE: rivaroxaban 10mg  (hold dose on Sunday for surgery on Monday) Code: Full AB: Vanc/Cefepime Therapy Recs: PT/OT recommending CIR DISPO: Anticipated discharge pending  clinical improvement .   Signature: Adam James, D.O.  Internal Medicine Resident, PGY-1 [facility]  Pager: [phone] 7:22 AM, 03/01/2021   Please contact the on call pager after 5 pm and on weekends at [phone].

## 2021-03-01 NOTE — Plan of Care (Signed)
  Problem: Clinical Measurements: Goal: Will remain free from infection Outcome: Progressing   Problem: Safety: Goal: Ability to remain free from injury will improve Outcome: Progressing   

## 2021-03-01 NOTE — Consult Note (Signed)
WOC Nurse Consult Note: Patient receiving care in [facility] 3E01 Previously consulted for the right foot wound and was assessed on 02/25/21 see note. Orders were placed and in the chart for nursing to clean the foot with soap and water. Dry and paint the calloused area at the base of the little toe with Betadine, allow to air dry then cover with a small piece of Xeroform gauze over the center of the wound and cover with foam dressing. Apply Betadine and Change Xeroform gauze daily. WOC will not follow but are available to this patient. Please page on Monday AM if WOC still needs to see this patient.  Renaldo Reel Katrinka Blazing, MSN, RN, CMSRN, Angus Seller, [facility] Wound Treatment Associate Pager [phone]

## 2021-03-01 NOTE — Progress Notes (Signed)
Patient stable.  Eating lunch in bed. Left lower extremity has dressing intact with wound VAC intact.  Compartments are soft.  Toes are perfused and mobile.  Plan for return to the operating room on Monday for repeat debridement

## 2021-03-02 ENCOUNTER — Inpatient Hospital Stay (HOSPITAL_COMMUNITY): Payer: Self-pay

## 2021-03-02 LAB — GLUCOSE, CAPILLARY
Glucose-Capillary: 174 mg/dL — ABNORMAL HIGH (ref 70–99)
Glucose-Capillary: 202 mg/dL — ABNORMAL HIGH (ref 70–99)
Glucose-Capillary: 177 mg/dL — ABNORMAL HIGH (ref 70–99)
Glucose-Capillary: 233 mg/dL — ABNORMAL HIGH (ref 70–99)

## 2021-03-02 LAB — URINALYSIS, COMPLETE (UACMP) WITH MICROSCOPIC
Bilirubin Urine: NEGATIVE
Glucose, UA: 150 mg/dL — AB
Ketones, ur: NEGATIVE mg/dL
Leukocytes,Ua: NEGATIVE
Nitrite: NEGATIVE
Protein, ur: 100 mg/dL — AB
RBC / HPF: >50 RBC/hpf — ABNORMAL HIGH (ref 0–5)
Specific Gravity, Urine: 1.015 (ref 1.005–1.030)
pH: 6 (ref 5.0–8.0)

## 2021-03-02 LAB — CBC
HCT: 28.2 % — ABNORMAL LOW (ref 39.0–52.0)
Hemoglobin: 9.2 g/dL — ABNORMAL LOW (ref 13.0–17.0)
MCH: 28.7 pg (ref 26.0–34.0)
MCHC: 32.6 g/dL (ref 30.0–36.0)
MCV: 87.9 fL (ref 80.0–100.0)
Platelets: 333 10*3/uL (ref 150–400)
RBC: 3.21 MIL/uL — ABNORMAL LOW (ref 4.22–5.81)
RDW: 14.1 % (ref 11.5–15.5)
WBC: 10 10*3/uL (ref 4.0–10.5)
nRBC: 0 % (ref 0.0–0.2)

## 2021-03-02 LAB — BASIC METABOLIC PANEL
Anion gap: 7 (ref 5–15)
BUN: 28 mg/dL — ABNORMAL HIGH (ref 6–20)
CO2: 21 mmol/L — ABNORMAL LOW (ref 22–32)
Calcium: 7.9 mg/dL — ABNORMAL LOW (ref 8.9–10.3)
Chloride: 104 mmol/L (ref 98–111)
Creatinine, Ser: 2.09 mg/dL — ABNORMAL HIGH (ref 0.61–1.24)
GFR, Estimated: 38 mL/min — ABNORMAL LOW (ref >60)
Glucose, Bld: 213 mg/dL — ABNORMAL HIGH (ref 70–99)
Potassium: 4.1 mmol/L (ref 3.5–5.1)
Sodium: 132 mmol/L — ABNORMAL LOW (ref 135–145)

## 2021-03-02 LAB — PROTEIN / CREATININE RATIO, URINE
Creatinine, Urine: 100.2 mg/dL
Total Protein, Urine: <6 mg/dL

## 2021-03-02 IMAGING — US US RENAL
1 series · 14 of 25 positions shown · non-contrast
Comparison: None.

CLINICAL DATA: Hematuria.

EXAM:
RENAL / URINARY TRACT ULTRASOUND COMPLETE

[Series 1: us renal · 14 of 28 slices shown]
[im 1/28]
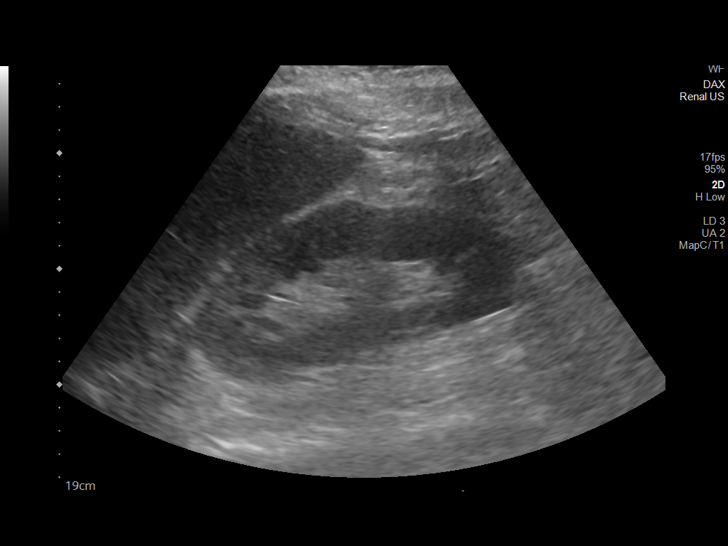
[im 3/28]
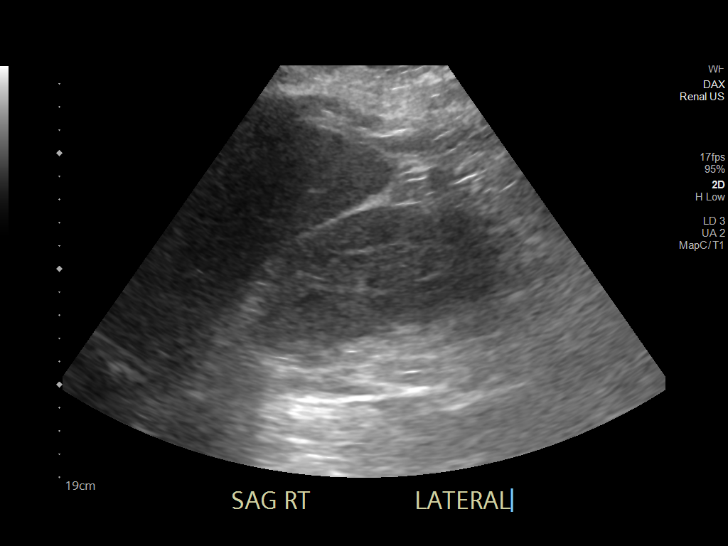
[im 5/28]
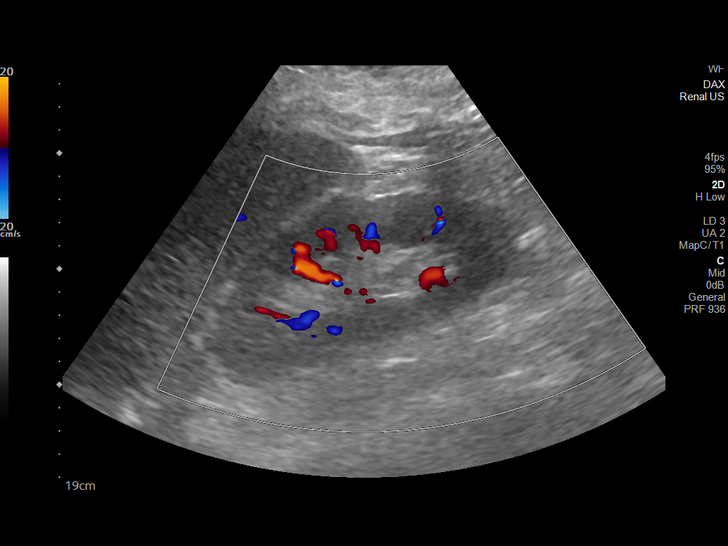
[im 7/28]
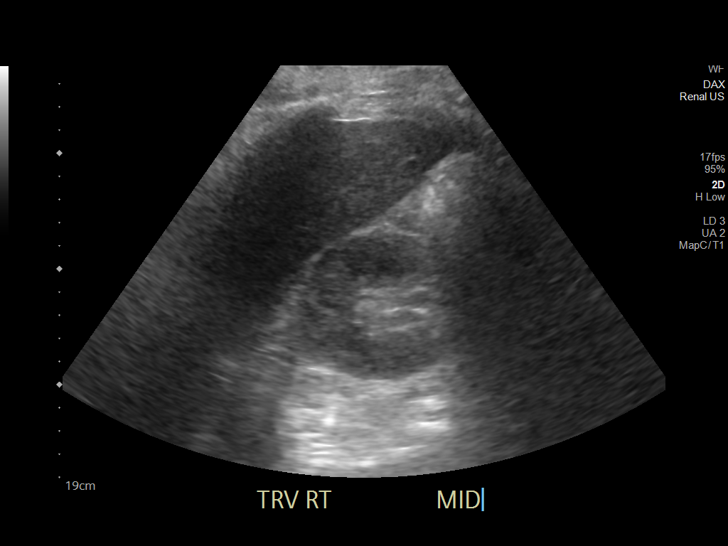
[im 10/28]
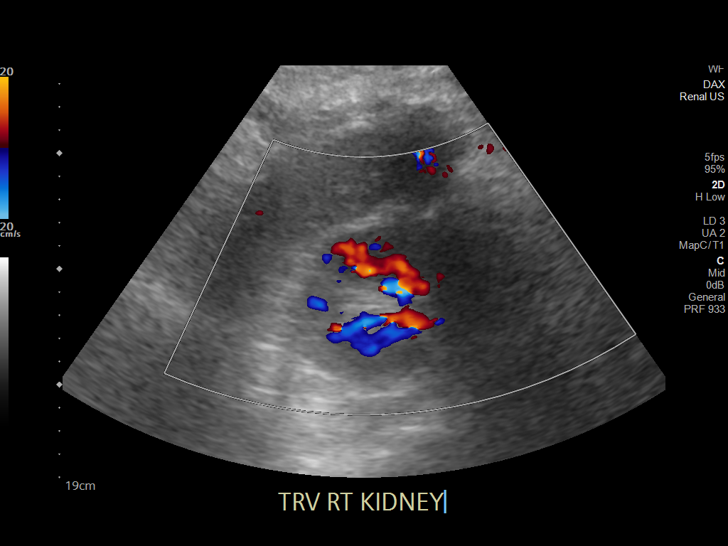
[im 11/28]
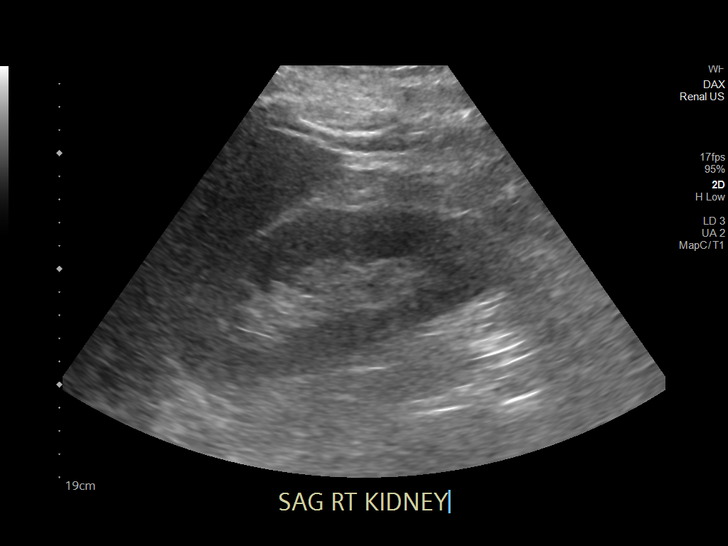
[im 13/28]
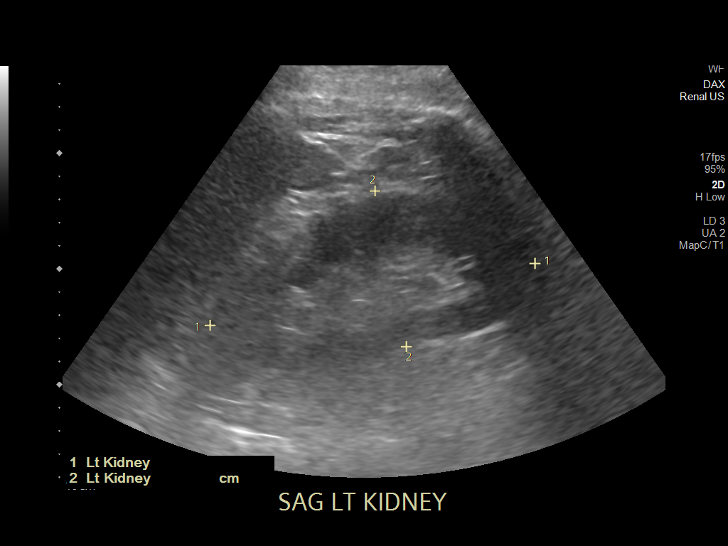
[im 15/28]
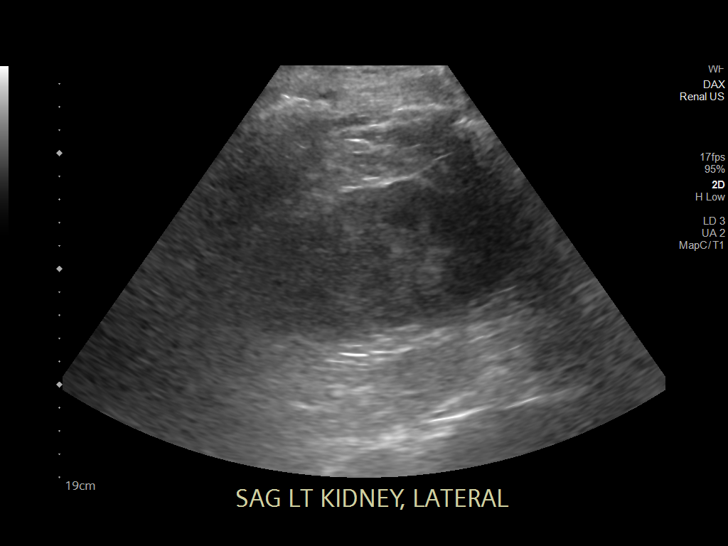
[im 17/28]
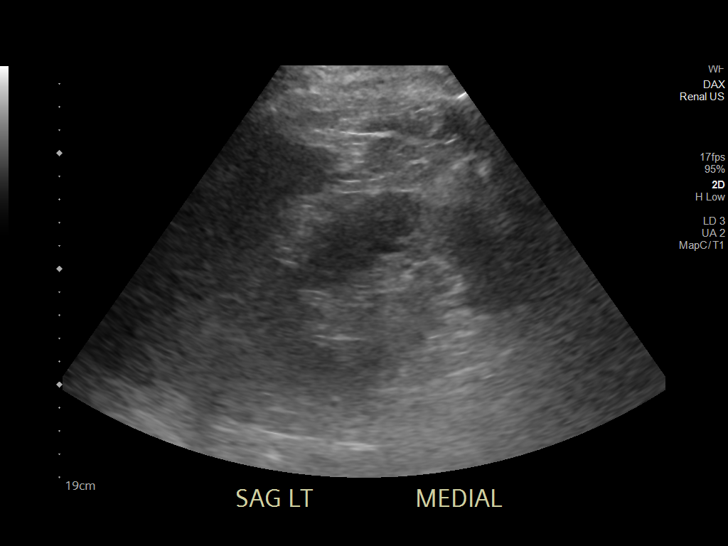
[im 19/28]
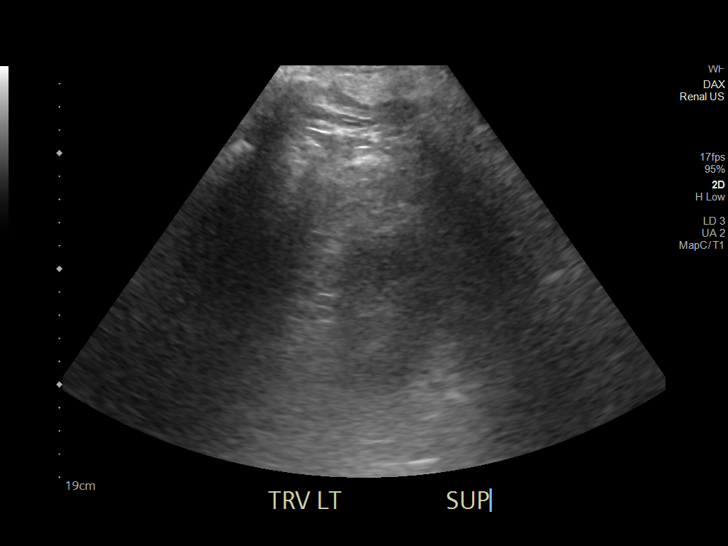
[im 21/28]
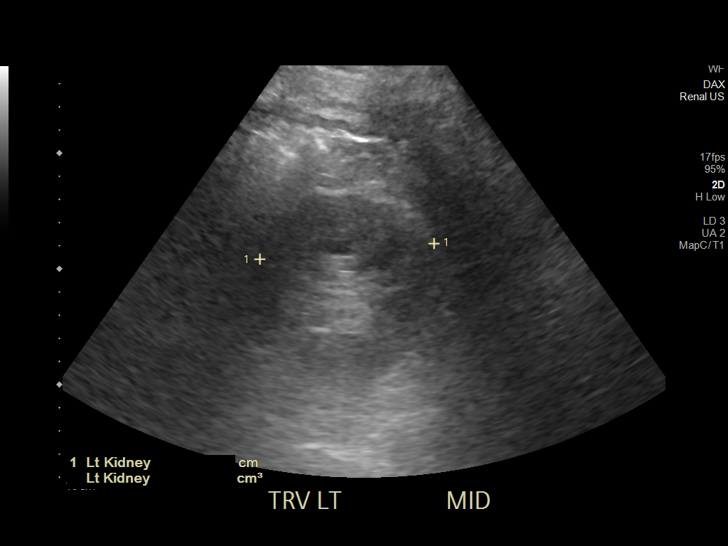
[im 23/28]
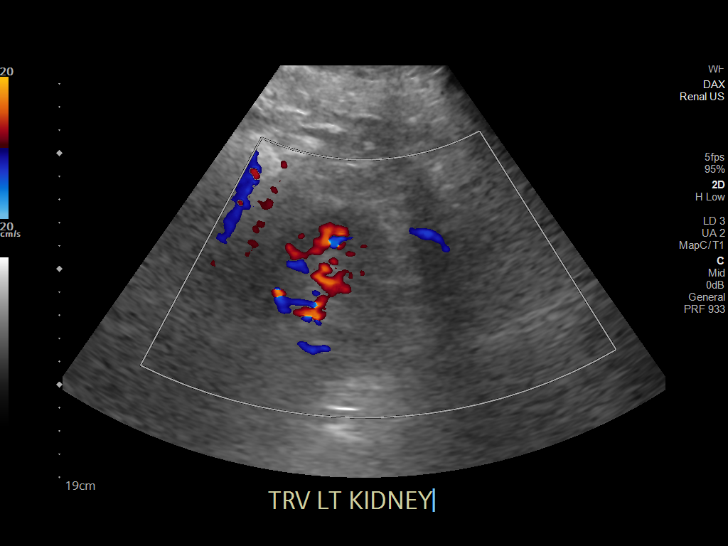
[im 25/28]
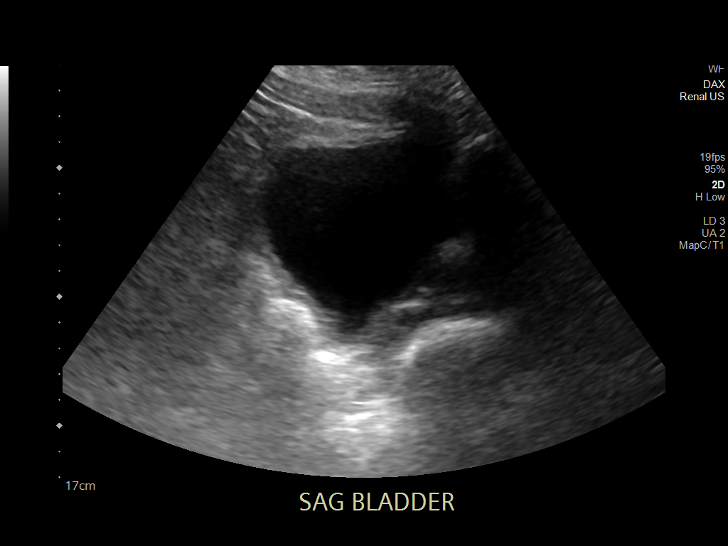
[im 28/28]
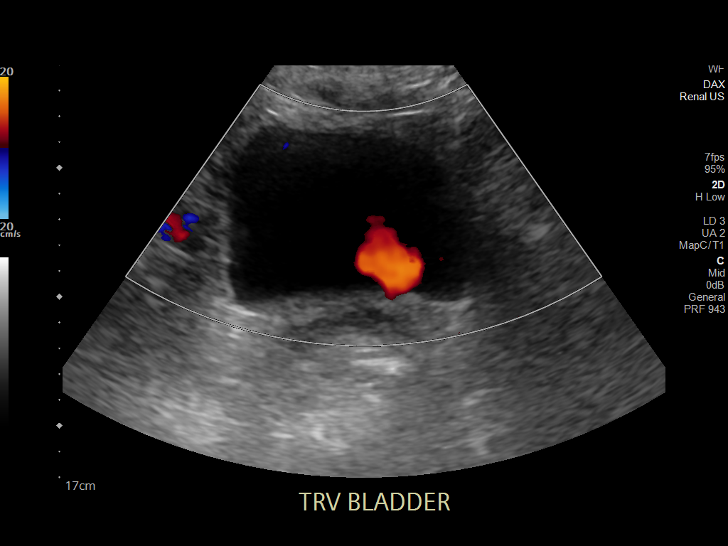

[14 of 25 positions shown; findings below may reference images not displayed]

FINDINGS: Right Kidney:

Renal measurements: 14.2 x 6.6 x 6.6 centimeters = volume: 323.6 mL.
Echogenicity within normal limits. No mass or hydronephrosis
visualized.

Left Kidney:

Renal measurements: 14.3 x 6.9 x 7.6 centimeters = volume: 388.0 mL.
Echogenicity within normal limits. No mass or hydronephrosis
visualized.

Bladder:

Appears normal for degree of bladder distention.

Other:

None.
IMPRESSION: Normal renal ultrasound.  No hydronephrosis.

## 2021-03-02 MED ORDER — INSULIN ASPART 100 UNIT/ML IJ SOLN
8.0000 [IU] | Freq: Three times a day (TID) | INTRAMUSCULAR | Status: DC
  Administered 2021-03-02 – 2021-03-15 (×23): 8 [IU] via SUBCUTANEOUS

## 2021-03-02 MED ORDER — SODIUM CHLORIDE 0.9 % IV SOLN
750.0000 mg | INTRAVENOUS | Status: DC
  Administered 2021-03-02: 750 mg via INTRAVENOUS
  Filled 2021-03-02 (×2): qty 15

## 2021-03-02 MED ORDER — INSULIN GLARGINE-YFGN 100 UNIT/ML ~~LOC~~ SOLN
15.0000 [IU] | Freq: Every day | SUBCUTANEOUS | Status: DC
  Administered 2021-03-02 – 2021-03-14 (×13): 15 [IU] via SUBCUTANEOUS
  Filled 2021-03-02 (×15): qty 0.15

## 2021-03-02 MED ORDER — INSULIN GLARGINE-YFGN 100 UNIT/ML ~~LOC~~ SOLN
20.0000 [IU] | Freq: Every day | SUBCUTANEOUS | Status: DC
  Filled 2021-03-02: qty 0.2

## 2021-03-02 MED ORDER — LACTATED RINGERS IV BOLUS
1000.0000 mL | Freq: Once | INTRAVENOUS | Status: AC
  Administered 2021-03-02: 1000 mL via INTRAVENOUS

## 2021-03-02 NOTE — Progress Notes (Signed)
Patient stable.  Reports no changes since yesterday.  He denies any fevers, chills, night sweats, increased pain.  Pain is overall controlled.  Wound VAC is intact with about 400 cc in the wound VAC canister of serosanguineous fluid.  No significant pain with passive and active flexion extension of his toes.  No calf tenderness of the nonoperative calf.  Plan is to return to the operating room on Monday with Dr. Roda Shutters for repeat debridement.

## 2021-03-02 NOTE — Progress Notes (Signed)
HD#7 SUBJECTIVE:  Patient Summary: Adam James is a 50 y.o. 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  Overnight Events: No acute overnight events.   Interim History: This is hospital day 7 for Adam James who was seen and evaluated at the bedside this morning. He feels okay today and notes that his pain is well controlled at this time; He is drinking about 2 cups of water daily and continues to eat well. He does not that he has no prior history of hematuria, but is still having some brown colored urine. No prior history of BPH or foley catheter.   OBJECTIVE:  Vital Signs: Vitals:   03/01/21 1617 03/01/21 1917 03/02/21 0013 03/02/21 0412  BP: (!) 147/97 (!) 143/80 133/86 136/81  Pulse: 89     Resp: 18 18 18 18   Temp: 97.8 F (36.6 C) 98.2 F (36.8 C) 98.2 F (36.8 C) 98.2 F (36.8 C)  TempSrc: Oral Oral Oral Oral  SpO2: 98% 98% 98% 98%  Weight:    116 kg  Height:       Supplemental O2: Room Air SpO2: 98%  Filed Weights   02/28/21 0342 03/01/21 0427 03/02/21 0412  Weight: 112.4 kg 114.6 kg 116 kg     Intake/Output Summary (Last 24 hours) at 03/02/2021 0654 Last data filed at 03/02/2021 05/02/2021 Gross per 24 hour  Intake 768.92 ml  Output 900 ml  Net -131.08 ml   Net IO Since Admission: 4,322.61 mL [03/02/21 0654]  Physical Exam: General: Patient is not in any acute distress CV: RRR. No murmurs, rubs, or gallops. No LE edema Pulmonary: Lungs CTAB. Normal effort. No wheezing or rales. Extremities: Palpable radial and DP pulses on the right. 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 2cm ulcer on plantar aspect of R foot under the base of the 4th-5th metatarsal, surrounding skin is necrotic, wrapped in gauze. Unable to assess LLE due to post-op dressing that extends up to patient's knee.  Wound vac in place with ~400cc serosanguinous fluid output unclear if this has been emptied recently. Skin: Warm and dry. No obvious rash or lesions. Neuro: A&Ox3. Reduced sensation in lower extremities. No focal deficit. Psych: Normal mood and affect give patient situation   Patient Lines/Drains/Airways Status     Active Line/Drains/Airways     Name Placement date Placement time Site Days   Peripheral IV 02/24/21 20 G Left Antecubital 02/24/21  1744  Antecubital  6   Open Drain 2 Left;Anterior Foot 0.1 Fr. 02/24/21  2325  Foot  6   Negative Pressure Wound Therapy Leg Left 02/26/21  1550  --  4   Incision (Closed) 02/24/21 Leg Left 02/24/21  2326  -- 6   Incision (Closed) 02/26/21 Leg Left 02/26/21  1534  -- 4   Wound / Incision (Open or Dehisced) 02/25/21 Diabetic ulcer Foot Right 02/25/21  0130  Foot  5   Wound / Incision (Open or Dehisced) 02/25/21 Diabetic ulcer Foot Right 02/25/21  0130  Foot  5            Pertinent Labs: CBC Latest Ref Rng & Units 03/02/2021 03/01/2021 02/28/2021  WBC 4.0 - 10.5 K/uL 10.0 11.1(H) -  Hemoglobin 13.0 - 17.0 g/dL 04/30/2021) 3.7(J) 6.9(C)  Hematocrit 39.0 - 52.0 % 28.2(L) 26.3(L) 25.8(L)  Platelets 150 - 400 K/uL 333 323 -  CMP Latest Ref Rng & Units 03/02/2021 03/01/2021 02/28/2021  Glucose 70 - 99 mg/dL 409(W) 119(J) 478(G)  BUN 6 - 20 mg/dL 95(A) 21(H) 08(M)  Creatinine 0.61 - 1.24 mg/dL 5.78(I) 6.96(E) 9.52(W)  Sodium 135 - 145 mmol/L 132(L) 132(L) 130(L)  Potassium 3.5 - 5.1 mmol/L 4.1 3.8 3.8  Chloride 98 - 111 mmol/L 104 103 103  CO2 22 - 32 mmol/L 21(L) 21(L) 18(L)  Calcium 8.9 - 10.3 mg/dL 7.9(L) 7.9(L) 7.5(L)  Total Protein 6.5 - 8.1 g/dL - - -  Total Bilirubin 0.3 - 1.2 mg/dL - - -  Alkaline Phos 38 - 126 U/L - - -  AST 15 - 41 U/L - - -  ALT 0 - 44 U/L - - -    Recent Labs    03/01/21 1619 03/01/21 2100 03/02/21 0618  GLUCAP 256* 219* 174*     Pertinent Imaging: No results found.  ASSESSMENT/PLAN:  Assessment: Principal  Problem:   Gangrene of left foot (HCC) Active Problems:   Cardiomyopathy (HCC)   Type 2 diabetes mellitus (HCC)   Diabetic foot ulcer (HCC)   CAD (coronary artery disease)   Necrotizing fasciitis (HCC)   Left leg cellulitis   Diabetic polyneuropathy associated with type 2 diabetes mellitus (HCC)   Severe protein-calorie malnutrition (HCC)   Plan: #Left sided diabetic foot infection with gas gangrene and necrotizing fasciitis Patient is postoperative day 4 for limb salvage intervention of the left leg where diagnosis of necrotizing fasciitis was confirmed.  Wound vac remains in place and noted to still have ~400cc of serosanguinous fluid this morning. Leukocytosis continues to down trend and is normal today, at 10. The patient continues to receive IV antibiotics.  - ID consulted, appreciate recommendations  - Vancomycin discontinued and switched to Daptomycin on 8/7 with renal dosing - Continue with Unasyn, will need long term antibiotics - PICC next week after surgery - Will return to the OR tomorrow with ortho for further debridement - Monitor wound vac output - Prevalon boot placed to reduce risk of ankle contractures - Continue to work with PT/OT as able  #Right sided diabetic foot ulcer Patient does not have any pain in his right foot. Pulses are palpable and the wound is still wrapped in gauze. Wound care saw the patient yesterday and placed Betadine and wrapped the wound in Xeroform gauze again. Wound care nurse is no longer following but is still available for the patient. - Continue with dressing changes  #Type 2 Diabetes with diabetic neuropathy HbA1c 8.8. Patient received 15U Lantus with SSI yesterday. Blood sugars been between 170s-200s. - Insulin glargine to 15 units qhs with SSI - Added Novolog 5U TID with meals yesterday, increased to 8U TID with meals today - Continue CBG monitoring   #Chronic HFrEF #Ischemic heart disease Patient continues to deny any chest pain,  SOB, orthopnea, or lower extremity edema. Home medications were initially held in the setting of hypotension. He received 3 units of PRBCs on 8/4 due to low hemoglobin levels and Hb has remained stable since.  - Continue to hold lasix, irbesartan, and amlodipine - Metoprolol 25 mg daily  - Plan to restart irbesartan after next surgery if BP and renal function is doing well - Continue atorvastatin and aspirin - Continue to monitor CBC, transfuse if Hb <8  #Acute on chronic renal failure Patient has CKD IIIa at his baseline and initially had worsening of his kidney function upon admission. Cr 2.09 today, from 1.77 yesterday.  - LR  1L bolus today  - Renal US pending - Continue to monitor renal function - Avoid nephrotoxic agents  #Hematuria Patient continues to have brown urine today. He denies dysuria or any other urinary symptoms. UA yesterday significant for >300 protein, >50 RBCs, no leukocyte esterase or nitrites. CK level within normal limits. Urine protein/creatinine ratio result below reportable range. - Repeat UA pending  Best Practice: Diet: Carb modified diet IVF: LR 1L bolus  VTE: rivaroxaban 10mg  (held today for surgery on Monday) Code: Full AB: Daptomycin/Unasyn Therapy Recs: PT/OT recommending CIR DISPO: Anticipated discharge pending  clinical improvement .  Signature: Thursday, D.O.  Internal Medicine Resident, PGY-1 [facility]  Pager: [phone] 6:54 AM, 03/02/2021   Please contact the on call pager after 5 pm and on weekends at [phone].

## 2021-03-02 NOTE — Progress Notes (Addendum)
ID Brief Note  Would recommend to switch Vancomycin to daptomycin ( 8mg /kg), renal dosing given elevated Cr.  Following cultures for de-escalation Planned for repeat debridement on Monday   Monday, MD Infectious Disease Physician [facility] Infect[facility] Disease [address] [place], [place] Phone: [phone]  Fax: [phone]

## 2021-03-02 NOTE — Progress Notes (Addendum)
Pharmacy Antibiotic Note  Adam James is a 50 y.o. male admitted on 02/24/2021 with  LLE gas gangrene s/p I&D . Pharmacy has been consulted for daptomycin dosing per ID.  Patient was initially started on vancomycin and Zosyn. Zosyn was de-escalated to cefepime, which was de-escalated to Unasyn per cultures. Vancomycin was discontinued on 8/6 per cultures and due to worsening renal function with creatinine at 2.09 (baseline around 1.2).   Daptomycin 8mg /kg was ordered by ID. Due to patient BMI>30, adjusted body weight of 97 kg was used to dose. CK on 8/6 was 50. WBC improving at 10, afebrile. Wound cx growing abundant strep agalactiae and rare staph aureus - awaiting sensitivities.  Plan: Start daptomycin 8mg /kg daily  Continue Unasyn 3 gm IV Q 6 hr Monitor culture results, clinical picture, and length of treatment Monitor CK every Wednesday starting 8/10  Height: 6\' 3"  (190.5 cm) Weight: 116 kg (255 lb 11.7 oz) IBW/kg (Calculated) : 84.5  Temp (24hrs), Avg:98.1 F (36.7 C), Min:97.8 F (36.6 C), Max:98.2 F (36.8 C)  Recent Labs  Lab 02/24/21 1802 02/24/21 1926 02/25/21 0350 02/26/21 0210 02/26/21 2210 02/27/21 0116 02/28/21 0539 03/01/21 0451 03/02/21 0244  WBC  --   --    < > 15.5* 19.0* 18.8* 12.3* 11.1* 10.0  CREATININE  --   --    < > 2.16*  --  2.00* 1.78* 1.77* 2.09*  LATICACIDVEN 2.5* 1.0  --   --   --   --   --   --   --    < > = values in this interval not displayed.    Estimated Creatinine Clearance: 59.4 mL/min (A) (by C-G formula based on SCr of 2.09 mg/dL (H)).    Allergies  Allergen Reactions   Morphine Itching    Antimicrobials this admission: 8/1 zosyn>>8/2 8/1 vanc>>8/6 8/2 cefepime>>8/4 8/4 Unasyn> 8/7 Dapto>  Microbiology results: 8/1 wound>>SA and GBS - abundant step agalactiae, rare staph aureus  8/1 blood>>2/4 bottles staph epi/hominis 8/3 tissue>>rare strep agalactiae   Thank you for allowing pharmacy to be a part of this patient's  care.  10/4, PharmD PGY1 Acute Care Pharmacy Resident  Phone: [phone] 03/02/2021  9:44 AM  Please check [email or web address] for unit-specific pharmacy phone numbers.

## 2021-03-03 ENCOUNTER — Encounter (HOSPITAL_COMMUNITY)
Admission: EM | Disposition: A | Discharge status: Home Health | Payer: Self-pay | Source: Home / Self Care | Attending: Student in an Organized Health Care Education/Training Program

## 2021-03-03 ENCOUNTER — Encounter (HOSPITAL_COMMUNITY): Payer: Self-pay | Admitting: Student in an Organized Health Care Education/Training Program

## 2021-03-03 ENCOUNTER — Inpatient Hospital Stay (HOSPITAL_COMMUNITY): Payer: Self-pay | Admitting: Anesthesiology

## 2021-03-03 HISTORY — PX: APPLICATION OF WOUND VAC: SHX5189

## 2021-03-03 HISTORY — PX: I & D EXTREMITY: SHX5045

## 2021-03-03 LAB — GLUCOSE, CAPILLARY
Glucose-Capillary: 172 mg/dL — ABNORMAL HIGH (ref 70–99)
Glucose-Capillary: 174 mg/dL — ABNORMAL HIGH (ref 70–99)
Glucose-Capillary: 150 mg/dL — ABNORMAL HIGH (ref 70–99)
Glucose-Capillary: 214 mg/dL — ABNORMAL HIGH (ref 70–99)
Glucose-Capillary: 193 mg/dL — ABNORMAL HIGH (ref 70–99)

## 2021-03-03 LAB — BASIC METABOLIC PANEL
Anion gap: 7 (ref 5–15)
BUN: 34 mg/dL — ABNORMAL HIGH (ref 6–20)
CO2: 19 mmol/L — ABNORMAL LOW (ref 22–32)
Calcium: 7.8 mg/dL — ABNORMAL LOW (ref 8.9–10.3)
Chloride: 104 mmol/L (ref 98–111)
Creatinine, Ser: 2.44 mg/dL — ABNORMAL HIGH (ref 0.61–1.24)
GFR, Estimated: 32 mL/min — ABNORMAL LOW (ref >60)
Glucose, Bld: 196 mg/dL — ABNORMAL HIGH (ref 70–99)
Potassium: 4.2 mmol/L (ref 3.5–5.1)
Sodium: 130 mmol/L — ABNORMAL LOW (ref 135–145)

## 2021-03-03 LAB — CBC
HCT: 27.1 % — ABNORMAL LOW (ref 39.0–52.0)
Hemoglobin: 8.8 g/dL — ABNORMAL LOW (ref 13.0–17.0)
MCH: 28.5 pg (ref 26.0–34.0)
MCHC: 32.5 g/dL (ref 30.0–36.0)
MCV: 87.7 fL (ref 80.0–100.0)
Platelets: 318 10*3/uL (ref 150–400)
RBC: 3.09 MIL/uL — ABNORMAL LOW (ref 4.22–5.81)
RDW: 13.7 % (ref 11.5–15.5)
WBC: 10.7 10*3/uL — ABNORMAL HIGH (ref 4.0–10.5)
nRBC: 0 % (ref 0.0–0.2)

## 2021-03-03 SURGERY — IRRIGATION AND DEBRIDEMENT EXTREMITY
Anesthesia: General | Site: Leg Lower | Laterality: Left

## 2021-03-03 MED ORDER — ONDANSETRON HCL 4 MG/2ML IJ SOLN
INTRAMUSCULAR | Status: AC
  Filled 2021-03-03: qty 2

## 2021-03-03 MED ORDER — FENTANYL CITRATE (PF) 100 MCG/2ML IJ SOLN
25.0000 ug | INTRAMUSCULAR | Status: DC | PRN
  Administered 2021-03-03 (×3): 50 ug via INTRAVENOUS

## 2021-03-03 MED ORDER — SODIUM CHLORIDE 0.9 % IV SOLN: INTRAVENOUS | Status: DC

## 2021-03-03 MED ORDER — ONDANSETRON HCL 4 MG/2ML IJ SOLN
INTRAMUSCULAR | Status: DC | PRN
  Administered 2021-03-03: 4 mg via INTRAVENOUS

## 2021-03-03 MED ORDER — LIDOCAINE 2% (20 MG/ML) 5 ML SYRINGE
INTRAMUSCULAR | Status: DC | PRN
  Administered 2021-03-03: 60 mg via INTRAVENOUS

## 2021-03-03 MED ORDER — PHENYLEPHRINE HCL-NACL 20-0.9 MG/250ML-% IV SOLN
INTRAVENOUS | Status: DC | PRN
  Administered 2021-03-03: 60 ug/min via INTRAVENOUS

## 2021-03-03 MED ORDER — CHLORHEXIDINE GLUCONATE 0.12 % MT SOLN: 15.0000 mL | Freq: Once | OROMUCOSAL | Status: AC

## 2021-03-03 MED ORDER — CHLORHEXIDINE GLUCONATE 0.12 % MT SOLN
OROMUCOSAL | Status: AC
  Administered 2021-03-03: 15 mL via OROMUCOSAL
  Filled 2021-03-03: qty 15

## 2021-03-03 MED ORDER — MIDAZOLAM HCL 2 MG/2ML IJ SOLN
INTRAMUSCULAR | Status: AC
  Filled 2021-03-03: qty 2

## 2021-03-03 MED ORDER — 0.9 % SODIUM CHLORIDE (POUR BTL) OPTIME
TOPICAL | Status: DC | PRN
  Administered 2021-03-03: 1000 mL

## 2021-03-03 MED ORDER — PROPOFOL 10 MG/ML IV BOLUS
INTRAVENOUS | Status: AC
  Filled 2021-03-03: qty 20

## 2021-03-03 MED ORDER — FENTANYL CITRATE (PF) 100 MCG/2ML IJ SOLN
INTRAMUSCULAR | Status: AC
  Filled 2021-03-03: qty 2

## 2021-03-03 MED ORDER — FENTANYL CITRATE (PF) 250 MCG/5ML IJ SOLN
INTRAMUSCULAR | Status: AC
  Filled 2021-03-03: qty 5

## 2021-03-03 MED ORDER — PROPOFOL 10 MG/ML IV BOLUS
INTRAVENOUS | Status: DC | PRN
  Administered 2021-03-03: 150 mg via INTRAVENOUS

## 2021-03-03 MED ORDER — MIDAZOLAM HCL 5 MG/5ML IJ SOLN
INTRAMUSCULAR | Status: DC | PRN
  Administered 2021-03-03: 2 mg via INTRAVENOUS

## 2021-03-03 MED ORDER — FENTANYL CITRATE (PF) 100 MCG/2ML IJ SOLN
INTRAMUSCULAR | Status: DC | PRN
  Administered 2021-03-03 (×5): 50 ug via INTRAVENOUS

## 2021-03-03 MED ORDER — SODIUM CHLORIDE 0.9 % IV SOLN
8.0000 mg/kg | INTRAVENOUS | Status: DC
  Administered 2021-03-03 – 2021-03-13 (×11): 800 mg via INTRAVENOUS
  Filled 2021-03-03 (×13): qty 16

## 2021-03-03 MED ORDER — LIDOCAINE 2% (20 MG/ML) 5 ML SYRINGE
INTRAMUSCULAR | Status: AC
  Filled 2021-03-03: qty 5

## 2021-03-03 MED ORDER — LACTATED RINGERS IV SOLN: INTRAVENOUS | Status: DC

## 2021-03-03 MED ORDER — SODIUM CHLORIDE 0.9 % IV SOLN: INTRAVENOUS | Status: DC | PRN

## 2021-03-03 MED ORDER — SODIUM CHLORIDE 0.9 % IR SOLN
Status: DC | PRN
  Administered 2021-03-03 (×2): 3000 mL

## 2021-03-03 MED ORDER — ORAL CARE MOUTH RINSE: 15.0000 mL | Freq: Once | OROMUCOSAL | Status: AC

## 2021-03-03 SURGICAL SUPPLY — 47 items
BAG COUNTER SPONGE SURGICOUNT (BAG) ×2 IMPLANT
BNDG COHESIVE 4X5 TAN STRL (GAUZE/BANDAGES/DRESSINGS) ×1 IMPLANT
BNDG COHESIVE 6X5 TAN STRL LF (GAUZE/BANDAGES/DRESSINGS) ×2 IMPLANT
BNDG ELASTIC 3X5.8 VLCR STR LF (GAUZE/BANDAGES/DRESSINGS) IMPLANT
BNDG ELASTIC 4X5.8 VLCR STR LF (GAUZE/BANDAGES/DRESSINGS) ×1 IMPLANT
BNDG ELASTIC 6X5.8 VLCR STR LF (GAUZE/BANDAGES/DRESSINGS) ×1 IMPLANT
BNDG GAUZE ELAST 4 BULKY (GAUZE/BANDAGES/DRESSINGS) ×1 IMPLANT
COVER SURGICAL LIGHT HANDLE (MISCELLANEOUS) ×2 IMPLANT
CUFF TOURN SGL QUICK 24 (TOURNIQUET CUFF)
CUFF TOURN SGL QUICK 34 (TOURNIQUET CUFF)
CUFF TOURN SGL QUICK 42 (TOURNIQUET CUFF) IMPLANT
CUFF TRNQT CYL 24X4X16.5-23 (TOURNIQUET CUFF) IMPLANT
CUFF TRNQT CYL 34X4.125X (TOURNIQUET CUFF) IMPLANT
DRAPE U-SHAPE 47X51 STRL (DRAPES) ×1 IMPLANT
DRSG PAD ABDOMINAL 8X10 ST (GAUZE/BANDAGES/DRESSINGS) ×1 IMPLANT
DRSG VAC ATS LRG SENSATRAC (GAUZE/BANDAGES/DRESSINGS) ×2 IMPLANT
DURAPREP 26ML APPLICATOR (WOUND CARE) ×2 IMPLANT
ELECT REM PT RETURN 9FT ADLT (ELECTROSURGICAL)
ELECTRODE REM PT RTRN 9FT ADLT (ELECTROSURGICAL) IMPLANT
GAUZE SPONGE 4X4 12PLY STRL (GAUZE/BANDAGES/DRESSINGS) ×2 IMPLANT
GAUZE XEROFORM 5X9 LF (GAUZE/BANDAGES/DRESSINGS) ×1 IMPLANT
GLOVE SURG LTX SZ7 (GLOVE) ×2 IMPLANT
GLOVE SURG NEOP MICRO LF SZ7.5 (GLOVE) ×4 IMPLANT
GLOVE SURG UNDER POLY LF SZ7 (GLOVE) ×6 IMPLANT
GOWN STRL REIN XL XLG (GOWN DISPOSABLE) ×4 IMPLANT
HANDPIECE INTERPULSE COAX TIP (DISPOSABLE) ×1
KIT BASIN OR (CUSTOM PROCEDURE TRAY) ×2 IMPLANT
KIT TURNOVER KIT B (KITS) ×2 IMPLANT
MANIFOLD NEPTUNE II (INSTRUMENTS) ×2 IMPLANT
PACK ORTHO EXTREMITY (CUSTOM PROCEDURE TRAY) ×2 IMPLANT
PAD ARMBOARD 7.5X6 YLW CONV (MISCELLANEOUS) ×4 IMPLANT
PADDING CAST ABS 4INX4YD NS (CAST SUPPLIES)
PADDING CAST ABS COTTON 4X4 ST (CAST SUPPLIES) ×1 IMPLANT
PADDING CAST COTTON 6X4 STRL (CAST SUPPLIES) ×1 IMPLANT
SET HNDPC FAN SPRY TIP SCT (DISPOSABLE) IMPLANT
SPONGE T-LAP 18X18 ~~LOC~~+RFID (SPONGE) ×2 IMPLANT
STOCKINETTE IMPERVIOUS 9X36 MD (GAUZE/BANDAGES/DRESSINGS) ×1 IMPLANT
SUT ETHILON 2 0 FS 18 (SUTURE) ×2 IMPLANT
SUT ETHILON 2 0 PSLX (SUTURE) ×6 IMPLANT
SUT VIC AB 2-0 FS1 27 (SUTURE) ×2 IMPLANT
SWAB CULTURE ESWAB REG 1ML (MISCELLANEOUS) IMPLANT
TOWEL GREEN STERILE (TOWEL DISPOSABLE) ×2 IMPLANT
TOWEL GREEN STERILE FF (TOWEL DISPOSABLE) ×2 IMPLANT
TUBE CONNECTING 12X1/4 (SUCTIONS) ×2 IMPLANT
UNDERPAD 30X36 HEAVY ABSORB (UNDERPADS AND DIAPERS) ×4 IMPLANT
WATER STERILE IRR 1000ML POUR (IV SOLUTION) ×2 IMPLANT
YANKAUER SUCT BULB TIP NO VENT (SUCTIONS) ×2 IMPLANT

## 2021-03-03 NOTE — H&P (Signed)

## 2021-03-03 NOTE — Progress Notes (Signed)
OT Cancellation Note  Patient Details Name: Adam James MRN: [id number] DOB: 03/21/1971   Cancelled Treatment:    Reason Eval/Treat Not Completed: Other (comment) Noted pt returning to OR today and declines therapy interventions until after debridement. Will follow-up tomorrow.  Lorre Munroe 03/03/2021, 7:56 AM

## 2021-03-03 NOTE — Progress Notes (Signed)
ID Brief Note    Component 7 d ago   Specimen Description ABSCESS LEFT FOOT   Special Requests VANCOMYCIN,ZOSYN   Gram Stain RARE WBC PRESENT,BOTH PMN AND MONONUCLEAR  FEW GRAM POSITIVE COCCI IN PAIRS   Culture ABUNDANT STREPTOCOCCUS AGALACTIAE  TESTING AGAINST S. AGALACTIAE NOT ROUTINELY PERFORMED DUE TO PREDICTABILITY OF AMP/PEN/VAN SUSCEPTIBILITY.  RARE METHICILLIN RESISTANT STAPHYLOCOCCUS AUREUS  WITHIN MIXED ORGANISMS  NO ANAEROBES ISOLATED  Performed at [facility], [address], [place], [place] [address]   Report Status PENDING   Organism ID, Bacteria METHICILLIN RESISTANT STAPHYLOCOCCUS AUREUS   Resulting Agency [facility]      Susceptibility   Methicillin resistant staphylococcus aureus    MIC    CIPROFLOXACIN >=8 RESISTANT  Resistant    CLINDAMYCIN <=0.25 SENS... Sensitive    ERYTHROMYCIN >=8 RESISTANT  Resistant    GENTAMICIN <=0.5 SENSI... Sensitive    Inducible Clindamycin NEGATIVE  Sensitive    OXACILLIN >=4 RESISTANT  Resistant    RIFAMPIN <=0.5 SENSI... Sensitive    TETRACYCLINE <=1 SENSITIVE  Sensitive    TRIMETH/SULFA 160 RESISTANT  Resistant    VANCOMYCIN 1 SENSITIVE  Sensitive           Micro has been contacted for identification of mixed organisms ( GNRs+ per Micro) Continue Daptomycin and Unasyn  Following   Odette Fraction, MD Infectious Disease Physician [facility] for Infectious Disease [address] [place], [place] [address] Phone: [phone]  Fax: [phone]

## 2021-03-03 NOTE — Progress Notes (Signed)
HD#8 SUBJECTIVE:  Patient Summary: Adam James is a 50 y.o. 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  Overnight Events: No acute events overnight  Interim History: This is hospital day 8 for [name] who was seen and evaluated at the bedside this morning. Patient's pain remains tolerable and he is going back to the OR today with ortho. He continues to have some hematuria and notes a decrease in his urine output.   OBJECTIVE:  Vital Signs: Vitals:   03/02/21 1600 03/02/21 1918 03/03/21 0012 03/03/21 0402  BP: (!) 136/94 140/83 (!) 148/86 140/85  Pulse: 88     Resp: 18 18 18 19   Temp: 98.7 F (37.1 C) 98.2 F (36.8 C) 98.4 F (36.9 C) 98.6 F (37 C)  TempSrc: Oral Oral Oral Oral  SpO2: 99% 99% 98% 97%  Weight:    117.1 kg  Height:       Supplemental O2: Room Air SpO2: 97%  Filed Weights   03/01/21 0427 03/02/21 0412 03/03/21 0402  Weight: 114.6 kg 116 kg 117.1 kg     Intake/Output Summary (Last 24 hours) at 03/03/2021 0647 Last data filed at 03/03/2021 0406 Gross per 24 hour  Intake 901.01 ml  Output 1025 ml  Net -123.99 ml   Net IO Since Admission: 3,748.62 mL [03/03/21 0647]  Physical Exam: General: Patient is not in any acute distress CV: RRR. No murmurs, rubs, or gallops. No LE edema. No JVD Pulmonary: Lungs CTAB. Normal effort. No wheezing or rales. Extremities: Palpable radial and DP pulses on the right. 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 2cm ulcer on plantar aspect of R foot under the base of the 4th-5th metatarsal, surrounding skin is necrotic, wrapped in gauze. Unable to assess LLE due to post-op dressing that extends up to patient's knee. Wound vac in place with ~400cc serosanguinous fluid output (wound vac to be replaced today) Skin: Warm and dry. No obvious rash  or lesions. Neuro: A&Ox3. Reduced sensation in lower extremities. No focal deficit. Psych: Normal mood and affect give patient situation   Patient Lines/Drains/Airways Status     Active Line/Drains/Airways     Name Placement date Placement time Site Days   Peripheral IV 02/24/21 20 G Left Antecubital 02/24/21  1744  Antecubital  7   Open Drain 2 Left;Anterior Foot 0.1 Fr. 02/24/21  2325  Foot  7   Negative Pressure Wound Therapy Leg Left 02/26/21  1550  --  5   Incision (Closed) 02/24/21 Leg Left 02/24/21  2326  -- 7   Incision (Closed) 02/26/21 Leg Left 02/26/21  1534  -- 5   Wound / Incision (Open or Dehisced) 02/25/21 Diabetic ulcer Foot Right 02/25/21  0130  Foot  6   Wound / Incision (Open or Dehisced) 02/25/21 Diabetic ulcer Foot Right 02/25/21  0130  Foot  6            Pertinent Labs: CBC Latest Ref Rng & Units 03/03/2021 03/02/2021 03/01/2021  WBC 4.0 - 10.5 K/uL 10.7(H) 10.0 11.1(H)  Hemoglobin 13.0 - 17.0 g/dL 05/01/2021) 7.3(U) 2.0(U)  Hematocrit 39.0 - 52.0 % 27.1(L) 28.2(L) 26.3(L)  Platelets 150 - 400 K/uL 318 333 323    CMP Latest Ref Rng & Units 03/03/2021 03/02/2021 03/01/2021  Glucose 70 - 99 mg/dL 05/01/2021) 706(C) 376(E)  BUN 6 - 20 mg/dL  34(H) 28(H) 25(H)  Creatinine 0.61 - 1.24 mg/dL 6.29(B) 2.84(X) 3.24(M)  Sodium 135 - 145 mmol/L 130(L) 132(L) 132(L)  Potassium 3.5 - 5.1 mmol/L 4.2 4.1 3.8  Chloride 98 - 111 mmol/L 104 104 103  CO2 22 - 32 mmol/L 19(L) 21(L) 21(L)  Calcium 8.9 - 10.3 mg/dL 7.8(L) 7.9(L) 7.9(L)  Total Protein 6.5 - 8.1 g/dL - - -  Total Bilirubin 0.3 - 1.2 mg/dL - - -  Alkaline Phos 38 - 126 U/L - - -  AST 15 - 41 U/L - - -  ALT 0 - 44 U/L - - -    Recent Labs    03/02/21 1636 03/02/21 2055 03/03/21 0610  GLUCAP 177* 233* 172*     Pertinent Imaging: US RENAL  Result Date: 03/02/2021 CLINICAL DATA:  Hematuria. EXAM: RENAL / URINARY TRACT ULTRASOUND COMPLETE COMPARISON:  None. FINDINGS: Right Kidney: Renal measurements: 14.2 x 6.6 x 6.6  centimeters = volume: 323.6 mL. Echogenicity within normal limits. No mass or hydronephrosis visualized. Left Kidney: Renal measurements: 14.3 x 6.9 x 7.6 centimeters = volume: 388.0 mL. Echogenicity within normal limits. No mass or hydronephrosis visualized. Bladder: Appears normal for degree of bladder distention. Other: None. IMPRESSION: Normal renal ultrasound.  No hydronephrosis. Electronically Signed   By: Norva Pavlov M.D.   On: 03/02/2021 16:25    ASSESSMENT/PLAN:  Assessment: Principal Problem:   Gangrene of left foot (HCC) Active Problems:   Cardiomyopathy (HCC)   Type 2 diabetes mellitus (HCC)   Diabetic foot ulcer (HCC)   CAD (coronary artery disease)   Necrotizing fasciitis (HCC)   Left leg cellulitis   Diabetic polyneuropathy associated with type 2 diabetes mellitus (HCC)   Severe protein-calorie malnutrition (HCC)   Plan: #Left sided diabetic foot infection with gas gangrene and necrotizing fasciitis Patient is postoperative day 5 for limb salvage intervention of the left leg where diagnosis of necrotizing fasciitis was confirmed.  Wound vac remains in place and noted to still have ~400cc of serosanguinous fluid this morning, although he will go to the OR today for further debridement and will have the wound vac replaced at that time. White blood cell count remains stable. The patient continues to receive IV antibiotics.  - ID consulted, appreciate recommendations  - Continue Daptomycin and Unasyn - Will need long term antibiotics and PICC placement after surgery - OR today with ortho for further debridement - Monitor wound vac output - Prevalon boot placed to reduce risk of ankle contractures - Continue to work with PT/OT as able  #Right sided diabetic foot ulcer Patient continues to deny any pain in his right foot. Pulses are palpable and the wound is still wrapped in gauze. Wound care saw the patient on 8/6 and placed Betadine and wrapped the wound in Xeroform gauze  again. Wound care nurse is no longer following but is still available for the patient if needed. - Dressing changes  #Type 2 Diabetes with diabetic neuropathy HbA1c 8.8. Patient receiving 15U Lantus with 8U Novolog TID with meals and SSI. Blood sugars been between 170s-230s as patient has only been receiving his Novolog once daily instead of three times daily with meals.  - Insulin glargine to 15 units qhs with SSI - Novolog 8U TID with meals  - Continue CBG monitoring   #Chronic HFrEF #Ischemic heart disease Patient continues to deny any chest pain, SOB, orthopnea, or lower extremity edema. Home medications were initially held in the setting of hypotension. He received 3 units of PRBCs  on 8/4 due to low hemoglobin levels and Hb has remained stable since. No signs of overt volume overload noted on exam. - Continue to hold lasix, irbesartan, and amlodipine - Metoprolol 25 mg daily  - May restart irbesartan after surgery if BP and renal function are doing well - Continue aspirin - Discontinued atorvastatin today while patient receives daptomycin to minimize risk of myopathy - Continue to monitor CBC, transfuse if Hb <8  #Acute on chronic renal failure #Acute tubular necrosis Patient has CKD IIIa at his baseline and initially had worsening of his kidney function upon admission. Cr continues to rise, up to 2.44 today. Patient also has been having brown urine the past couple of days. UA was negative for any infection, however, granular casts are present, consistent with ATN. Renal ultrasound was normal and showed no signs of hydronephrosis. - LR 75cc/hr maintenance fluids - Continue to monitor renal function - Avoid nephrotoxic agents   Best Practice: Diet: Carb modified diet IVF: LR 75cc/hr  VTE: rivaroxaban 10mg  (held, will resume tomorrow) Code: Full AB: Daptomycin/Unasyn Therapy Recs: PT/OT recommending CIR DISPO: Anticipated discharge pending  clinical improvement  .  Signature: , D.O.  Internal Medicine Resident, PGY-1 [facility]  Pager: [phone] 6:47 AM, 03/03/2021   Please contact the on call pager after 5 pm and on weekends at [phone].

## 2021-03-03 NOTE — Transfer of Care (Signed)
Immediate Anesthesia Transfer of Care Note  Patient: Adam James  Procedure(s) Performed: LEFT LEG IRRIGATION AND DEBRIDEMENT (Left: Leg Lower) APPLICATION OF WOUND VAC (Left: Leg Lower)  Patient Location: PACU  Anesthesia Type:General  Level of Consciousness: awake, alert  and patient cooperative  Airway & Oxygen Therapy: Patient Spontanous Breathing and Patient connected to face mask oxygen  Post-op Assessment: Report given to RN and Post -op Vital signs reviewed and stable  Post vital signs: Reviewed and stable  Last Vitals:  Vitals Value Taken Time  BP 149/97 03/03/21 1251  Temp    Pulse 95 03/03/21 1254  Resp 18 03/03/21 1254  SpO2 98 % 03/03/21 1254  Vitals shown include unvalidated device data.  Last Pain:  Vitals:   03/03/21 1049  TempSrc:   PainSc: 0-No pain      Patients Stated Pain Goal: 5 (03/03/21 1049)  Complications: No notable events documented.

## 2021-03-03 NOTE — Op Note (Signed)
   Date of Surgery: 03/03/2021  INDICATIONS: [name] is a 50 y.o.-year-old male with left leg infection who returns today for repeat irrigation and debridement.  The Patient did consent to the procedure after discussion of the risks and benefits.  PREOPERATIVE DIAGNOSIS: Necrotizing fasciitis left leg  POSTOPERATIVE DIAGNOSIS: Same.  PROCEDURE:  Excisional debridement of left leg necrotizing fasciitis, wound size 52 cm x 10 cm Excisional debridement of left foot wound 1.5 cm x 1 cm Application of wound vac left leg > 50 cm  Debridement type: Excisional Debridement  Side: left  Body Location: lower leg, foot   Tools used for debridement: scalpel, curette, and rongeur  Debridement depth beyond dead/damaged tissue down to healthy viable tissue: yes  Tissue layer involved: skin, subcutaneous tissue, muscle / fascia, bone  Nature of tissue removed: Necrotic, Devitalized Tissue, and Non-viable tissue  Irrigation volume: 6 L     Irrigation fluid type: Normal Saline   SURGEON: Adam James, M.D.  ASSIST:  Adam James, [place]; necessary for the timely completion of procedure and due to complexity of procedure. Adam James, med student  ANESTHESIA:  general  IV FLUIDS AND URINE: See anesthesia.  ESTIMATED BLOOD LOSS: minimal mL.  IMPLANTS: none  DRAINS: wound VAC  COMPLICATIONS: see description of procedure.  DESCRIPTION OF PROCEDURE: The patient was brought to the operating room.  The patient had been signed prior to the procedure and this was documented. The patient had the anesthesia placed by the anesthesiologist.  A time-out was performed to confirm that this was the correct patient, site, side and location. The patient did receive antibiotics prior to the incision and was re-dosed during the procedure as needed at indicated intervals.  The patient had the operative extremity prepped and draped in the standard surgical fashion.    We first removed all the  retaining sutures and remove the VAC sponge.  The wound was opened up and the small plantar fifth metatarsal wound was also opened back up.  There was still evidence of devitalized tissue which included skin, subcutaneous tissue, muscle.  Sharp excisional debridement was then performed with a combination of knife, curette, rondure.  Debridement was carried out throughout the entire wound.  Distally there was good healthy viable tissue.  The lateral compartment demonstrated a red dusky muscle.  There was no frank purulence.  The foot wound was also debrided with sharp excisional manner with a knife.  After those happy with the debridement 6 L of normal saline was irrigated throughout the wound.  VAC sponge was placed in the wound and retaining sutures were used to bring the skin edges closer together.  The wound VAC was then set to -125 mmHg.  Patient tolerated procedure well had no many complications.  Adam James was necessary for opening, closing, retracting, limb positioning and overall facilitation and timely completion of the procedure.  POSTOPERATIVE PLAN: Patient will return back to the operating room later this week with Dr. Lajoyce James for repeat washout.  Adam Reel, MD 3:30 PM

## 2021-03-03 NOTE — Anesthesia Preprocedure Evaluation (Addendum)
Anesthesia Evaluation  Patient identified by MRN, date of birth, ID band Patient awake    Reviewed: Allergy & Precautions, NPO status , Patient's Chart, lab work & pertinent test results, reviewed documented beta blocker date and time   Airway Mallampati: III  TM Distance: >3 FB Neck ROM: Full  Mouth opening: Limited Mouth Opening  Dental  (+) Loose, Dental Advisory Given,    Pulmonary neg pulmonary ROS, Current Smoker,    Pulmonary exam normal breath sounds clear to auscultation       Cardiovascular + angina + CAD, + Past MI and +CHF  Normal cardiovascular exam Rhythm:Regular Rate:Normal  TTE 2022 1. Anterior, anteroseptal and inferoseptal hypokinesis. Left ventricular  ejection fraction, by estimation, is 30 to 35%. The left ventricle has  moderately decreased function. The left ventricle demonstrates regional  wall motion abnormalities (see  scoring diagram/findings for description). There is mild concentric left  ventricular hypertrophy. Left ventricular diastolic parameters are  consistent with Grade I diastolic dysfunction (impaired relaxation).  2. Right ventricular systolic function is normal. The right ventricular  size is normal. There is normal pulmonary artery systolic pressure.  3. The mitral valve is normal in structure. Trivial mitral valve  regurgitation. No evidence of mitral stenosis.  4. The aortic valve is tricuspid. Aortic valve regurgitation is trivial.  No aortic stenosis is present.  5. Aortic dilatation noted. There is borderline dilatation of the aortic  root and of the ascending aorta, measuring 36 mm.  6. The inferior vena cava is normal in size with greater than 50%  respiratory variability, suggesting right atrial pressure of 3 mmHg.   LHC 2022 ? Mid LM to Dist LM lesion is 50% stenosed. ? Ramus lesion is 70% stenosed. ? 1st Mrg lesion is 50% stenosed. ? Prox Cx lesion is 70%  stenosed. ? Prox RCA to Mid RCA lesion is 100% stenosed. ? Dist RCA lesion is 100% stenosed. ? Ost LAD to Mid LAD lesion is 90% stenosed. ? Dist LAD lesion is 100% stenosed. ? Mid LAD to Dist LAD lesion is 90% stenosed.   Severe multivessel coronary obstructive disease with smooth 50% distal left main stenosis prior to trifurcating into the LAD, ramus intermediate, and left circumflex vessel.    The proximal LAD stent had diffuse 80-90 and 95% stenoses.  The LAD beyond the stent that segment had diffuse narrowing of 90 to 95% and then was totally occluded after a small second diagonal branch; 70% proximal ramus intermediate stenosis; 70% LAD stenosis with 50% stenosis in the OM vessel prior to its bifurcation; and total mid RCA occlusion with bridging collaterals abd total distal occlusion before the PDA takeoff with left to right collaterals supplying the PDA and PLA vessel    Neuro/Psych negative neurological ROS  negative psych ROS   GI/Hepatic negative GI ROS, Neg liver ROS,   Endo/Other  diabetes, Type 2, Oral Hypoglycemic Agents  Renal/GU Renal InsufficiencyRenal disease (Cr 2.44, K 4.2)  negative genitourinary   Musculoskeletal negative musculoskeletal ROS (+)   Abdominal   Peds  Hematology  (+) Blood dyscrasia (Hgb 8.8), anemia ,   Anesthesia Other Findings severe left foot diabetic infection with gas gangrene  Reproductive/Obstetrics                           Anesthesia Physical Anesthesia Plan  ASA: 4  Anesthesia Plan: General   Post-op Pain Management:    Induction: Intravenous  PONV Risk Score and  Plan: 1 and Ondansetron, Dexamethasone and Midazolam  Airway Management Planned: LMA  Additional Equipment:   Intra-op Plan:   Post-operative Plan: Extubation in OR  Informed Consent: I have reviewed the patients History and Physical, chart, labs and discussed the procedure including the risks, benefits and alternatives for the  proposed anesthesia with the patient or authorized representative who has indicated his/her understanding and acceptance.     Dental advisory given  Plan Discussed with: CRNA  Anesthesia Plan Comments:         Anesthesia Quick Evaluation

## 2021-03-03 NOTE — Anesthesia Procedure Notes (Signed)
Procedure Name: LMA Insertion Date/Time: 03/03/2021 11:32 AM Performed by: Trellis Paganini, CRNA Pre-anesthesia Checklist: Patient identified, Emergency Drugs available, Suction available and Patient being monitored Patient Re-evaluated:Patient Re-evaluated prior to induction Oxygen Delivery Method: Circle system utilized Preoxygenation: Pre-oxygenation with 100% oxygen Induction Type: IV induction LMA: LMA inserted LMA Size: 4.0 Tube type: Oral Placement Confirmation: positive ETCO2 and breath sounds checked- equal and bilateral Dental Injury: Teeth and Oropharynx as per pre-operative assessment

## 2021-03-04 ENCOUNTER — Encounter (HOSPITAL_COMMUNITY): Payer: Self-pay | Admitting: Orthopaedic Surgery

## 2021-03-04 LAB — CBC
HCT: 25.9 % — ABNORMAL LOW (ref 39.0–52.0)
Hemoglobin: 8.4 g/dL — ABNORMAL LOW (ref 13.0–17.0)
MCH: 28.2 pg (ref 26.0–34.0)
MCHC: 32.4 g/dL (ref 30.0–36.0)
MCV: 86.9 fL (ref 80.0–100.0)
Platelets: 308 10*3/uL (ref 150–400)
RBC: 2.98 MIL/uL — ABNORMAL LOW (ref 4.22–5.81)
RDW: 13.6 % (ref 11.5–15.5)
WBC: 11.2 10*3/uL — ABNORMAL HIGH (ref 4.0–10.5)
nRBC: 0 % (ref 0.0–0.2)

## 2021-03-04 LAB — GLUCOSE, CAPILLARY
Glucose-Capillary: 145 mg/dL — ABNORMAL HIGH (ref 70–99)
Glucose-Capillary: 179 mg/dL — ABNORMAL HIGH (ref 70–99)
Glucose-Capillary: 164 mg/dL — ABNORMAL HIGH (ref 70–99)
Glucose-Capillary: 135 mg/dL — ABNORMAL HIGH (ref 70–99)

## 2021-03-04 LAB — BASIC METABOLIC PANEL
Anion gap: 6 (ref 5–15)
BUN: 36 mg/dL — ABNORMAL HIGH (ref 6–20)
CO2: 21 mmol/L — ABNORMAL LOW (ref 22–32)
Calcium: 7.7 mg/dL — ABNORMAL LOW (ref 8.9–10.3)
Chloride: 103 mmol/L (ref 98–111)
Creatinine, Ser: 2.88 mg/dL — ABNORMAL HIGH (ref 0.61–1.24)
GFR, Estimated: 26 mL/min — ABNORMAL LOW (ref >60)
Glucose, Bld: 180 mg/dL — ABNORMAL HIGH (ref 70–99)
Potassium: 4.3 mmol/L (ref 3.5–5.1)
Sodium: 130 mmol/L — ABNORMAL LOW (ref 135–145)

## 2021-03-04 MED ORDER — ACETAMINOPHEN 500 MG PO TABS
1000.0000 mg | ORAL_TABLET | Freq: Three times a day (TID) | ORAL | Status: DC
  Administered 2021-03-04 – 2021-03-14 (×22): 1000 mg via ORAL
  Filled 2021-03-04 (×27): qty 2

## 2021-03-04 MED ORDER — RIVAROXABAN 10 MG PO TABS
10.0000 mg | ORAL_TABLET | Freq: Every day | ORAL | Status: DC
  Administered 2021-03-04 – 2021-03-05 (×2): 10 mg via ORAL
  Filled 2021-03-04 (×2): qty 1

## 2021-03-04 MED ORDER — FENTANYL CITRATE (PF) 100 MCG/2ML IJ SOLN
25.0000 ug | INTRAMUSCULAR | Status: DC | PRN
  Administered 2021-03-05: 50 ug via INTRAVENOUS
  Administered 2021-03-09: 25 ug via INTRAVENOUS
  Filled 2021-03-04 (×2): qty 2

## 2021-03-04 NOTE — Progress Notes (Signed)
Subjective: 1 Day Post-Op Procedure(s) (LRB): LEFT LEG IRRIGATION AND DEBRIDEMENT (Left) APPLICATION OF WOUND VAC (Left) Patient reports pain as mild to moderate and relieved with pain meds  Objective: Vital signs in last 24 hours: Temp:  [97.7 F (36.5 C)-99.6 F (37.6 C)] 98.8 F (37.1 C) (08/09 1634) Pulse Rate:  [87-97] 87 (08/09 1634) Resp:  [16-19] 18 (08/09 1634) BP: (128-142)/(79-93) 128/81 (08/09 1634) SpO2:  [95 %-98 %] 95 % (08/09 1634) Weight:  [118.6 kg] 118.6 kg (08/09 0548)  Intake/Output from previous day: 08/08 0701 - 08/09 0700 In: 1751 [P.O.:960; I.V.:525; IV Piggyback:266] Out: 960 [Urine:875; Drains:75; Blood:10] Intake/Output this shift: Total I/O In: 1312 [P.O.:1312] Out: 600 [Urine:600]  Recent Labs    03/02/21 0244 03/03/21 0249 03/04/21 0211  HGB 9.2* 8.8* 8.4*   Recent Labs    03/03/21 0249 03/04/21 0211  WBC 10.7* 11.2*  RBC 3.09* 2.98*  HCT 27.1* 25.9*  PLT 318 308   Recent Labs    03/03/21 0249 03/04/21 0211  NA 130* 130*  K 4.2 4.3  CL 104 103  CO2 19* 21*  BUN 34* 36*  CREATININE 2.44* 2.88*  GLUCOSE 196* 180*  CALCIUM 7.8* 7.7*   No results for input(s): LABPT, INR in the last 72 hours.  Physical exam LLE-wound vac in place and properly functioning.  25 ml blood in canister    Assessment/Plan: 1 Day Post-Op Procedure(s) (LRB): LEFT LEG IRRIGATION AND DEBRIDEMENT (Left) APPLICATION OF WOUND VAC (Left) PLAN LLE- nwb.  Elevate.  Continue with wound vac.  Continue abx per ID Plan to return to the OR Friday with Dr. Lajoyce Corners for repeat washout.  Npo after midnight thursday      Cristie Hem 03/04/2021, 6:51 PM

## 2021-03-04 NOTE — Progress Notes (Signed)
HD#9 SUBJECTIVE:  Patient Summary: Adam James is a 50 y.o. 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   Overnight Events: No acute events overnight.   Interim History: This is hospital day 9 for Adam James who was seen and evaluated at the bedside this morning. Adam James feels he is gaining weight, but he denies chest pain, SOB, and orthopnea. He feels his mobility and ability to work with PT is limited due to pain.   OBJECTIVE:  Vital Signs: Vitals:   03/03/21 1320 03/03/21 2011 03/04/21 0014 03/04/21 0548  BP: (!) 159/84 (!) 132/91 (!) 142/93 138/79  Pulse: 93 97 91 88  Resp: 16 19 19 16   Temp: (!) 97.4 F (36.3 C) 97.7 F (36.5 C) 99.6 F (37.6 C) 98.7 F (37.1 C)  TempSrc:   Oral Oral  SpO2: 96%  96% 98%  Weight:    118.6 kg  Height:       Supplemental O2: Room Air SpO2: 98 % O2 Flow Rate (L/min): 0 L/min FiO2 (%): 21 %  Filed Weights   03/02/21 0412 03/03/21 0402 03/04/21 0548  Weight: 116 kg 117.1 kg 118.6 kg     Intake/Output Summary (Last 24 hours) at 03/04/2021 0746 Last data filed at 03/04/2021 0554 Gross per 24 hour  Intake 1751 ml  Output 960 ml  Net 791 ml   Net IO Since Admission: 4,539.62 mL [03/04/21 0746]  Physical Exam: General: Patient is not in any acute distress, but does appear to be in pain  CV: RRR. No murmurs, rubs, or gallops. No LE edema. No JVD Pulmonary: Lungs CTAB. Normal effort. No wheezing or rales. Extremities: Palpable radial and DP pulses on the right. 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 2cm ulcer on plantar aspect of R foot under the base of the 4th-5th metatarsal, surrounding skin is necrotic, wrapped in gauze. Unable to assess LLE due to post-op dressing that extends up to patient's knee. Wound vac in place with <100cc serosanguinous  fluid output  Skin: Warm and dry. No obvious rash or lesions. Neuro: A&Ox3. Reduced sensation in lower extremities. No focal deficit. Psych: Normal mood and affect give patient situation    ASSESSMENT/PLAN:  Assessment: Principal Problem:   Gangrene of left foot (HCC) Active Problems:   Cardiomyopathy (HCC)   Type 2 diabetes mellitus (HCC)   Diabetic foot ulcer (HCC)   CAD (coronary artery disease)   Necrotizing fasciitis (HCC)   Left leg cellulitis   Diabetic polyneuropathy associated with type 2 diabetes mellitus (HCC)   Severe protein-calorie malnutrition (HCC)   Plan: #Left sided diabetic foot infection with gas gangrene and necrotizing fasciitis Patient is postoperative day 1 after having repeat irrigation and debridement with ortho on 8/8. Ortho reported that there was still devitalized tissue, which included skin, subcutaneous tissue, and muscle. Debridement was performed and there was still viable tissue remaining. Lateral compartment of the leg demonstrated a red, dusky, muscle.  Wound vac replaced and noted to have <100cc of serosanguinous fluid this morning. White blood cell count remains stable. The patient continues to receive IV antibiotics as below.   - ID consulted, appreciate recommendations  - Continue Daptomycin and Unasyn - Will likely need long term antibiotics and PICC placement after surgery - Check CK level weekly with Daptomycin usage  - OR later this week with Dr.  Duda for repeat washout - Monitor wound vac output - Prevalon boot placed to reduce risk of ankle contractures - Continue to work with PT/OT as able  #Type 2 Diabetes with diabetic neuropathy HbA1c 8.8. Patient receiving 15U Lantus with 8U Novolog TID with meals and SSI. Blood sugars been between 140s-190s.  - Insulin glargine to 15 units qhs with SSI - Novolog 8U TID with meals  - Continue CBG monitoring   #Chronic HFrEF #Ischemic heart disease Patient continues to deny any chest pain,  SOB, orthopnea, or lower extremity edema, although his weight is up since admission. - Continue to hold lasix, irbesartan, and amlodipine - Continue metoprolol 25 mg daily  - Holding off on restarting irbesartan in the setting of worsening creatinine/renal function - Continue aspirin - Hold atorvastatin while patient receives daptomycin to minimize risk of myopathy, will resume after antibiotic course is finished - Continue to monitor CBC, transfuse if Hb <8  #Acute on chronic renal failure #Acute tubular necrosis Patient has CKD IIIa at his baseline and initially had worsening of his kidney function upon admission. Cr continues to rise, up to 2.88 today. He received IV fluids yesterday, but did not have an improvement in his renal function. Will hold off on fluids today to avoid volume overload. - Continue to monitor renal function - Avoid nephrotoxic agents   Best Practice: Diet: Carb modified diet IVF:  VTE: rivaroxaban 10mg  resumed 8/9 Code: Full AB: Daptomycin/Unasyn Therapy Recs: PT/OT recommending CIR DISPO: Anticipated discharge pending  clinical improvement .  Signature: 10/9, D.O.  Internal Medicine Resident, PGY-1 [facility]  Pager: [phone] 7:46 AM, 03/04/2021   Please contact the on call pager after 5 pm and on weekends at [phone].

## 2021-03-04 NOTE — Plan of Care (Signed)

## 2021-03-04 NOTE — Progress Notes (Signed)
PT Cancellation Note  Patient Details Name: Adam James MRN: [id number] DOB: 07-May-1971   Cancelled Treatment:    Reason Eval/Treat Not Completed: Pain limiting ability to participate. Pt declines any mobility. Pt did demonstrate he is moving his LLE and is performing passive dorsiflexion stretch by putting pressure through foot into blanket roll at footboard. Expect that progress will be limited until he feels his pain is significantly better.    Angelina Ok [facility] 03/04/2021, 11:18 AM [name] PT Acute Rehabilitation Services Pager [phone] Office [phone]

## 2021-03-04 NOTE — Anesthesia Postprocedure Evaluation (Signed)
Anesthesia Post Note  Patient: Adam James  Procedure(s) Performed: LEFT LEG IRRIGATION AND DEBRIDEMENT (Left: Leg Lower) APPLICATION OF WOUND VAC (Left: Leg Lower)     Patient location during evaluation: PACU Anesthesia Type: General Level of consciousness: awake and alert Pain management: pain level controlled Vital Signs Assessment: post-procedure vital signs reviewed and stable Respiratory status: spontaneous breathing, nonlabored ventilation, respiratory function stable and patient connected to nasal cannula oxygen Cardiovascular status: blood pressure returned to baseline and stable Postop Assessment: no apparent nausea or vomiting Anesthetic complications: no   No notable events documented.  Last Vitals:  Vitals:   03/04/21 0548 03/04/21 0805  BP: 138/79 132/89  Pulse: 88 91  Resp: 16 19  Temp: 37.1 C 36.9 C  SpO2: 98% 95%    Last Pain:  Vitals:   03/04/21 0805  TempSrc: Oral  PainSc:                  [name] [name]

## 2021-03-05 ENCOUNTER — Other Ambulatory Visit: Payer: Self-pay | Admitting: Physician Assistant

## 2021-03-05 LAB — BASIC METABOLIC PANEL
Anion gap: 7 (ref 5–15)
BUN: 37 mg/dL — ABNORMAL HIGH (ref 6–20)
CO2: 22 mmol/L (ref 22–32)
Calcium: 7.8 mg/dL — ABNORMAL LOW (ref 8.9–10.3)
Chloride: 101 mmol/L (ref 98–111)
Creatinine, Ser: 3.42 mg/dL — ABNORMAL HIGH (ref 0.61–1.24)
GFR, Estimated: 21 mL/min — ABNORMAL LOW (ref >60)
Glucose, Bld: 142 mg/dL — ABNORMAL HIGH (ref 70–99)
Potassium: 4.8 mmol/L (ref 3.5–5.1)
Sodium: 130 mmol/L — ABNORMAL LOW (ref 135–145)

## 2021-03-05 LAB — AEROBIC/ANAEROBIC CULTURE W GRAM STAIN (SURGICAL/DEEP WOUND)

## 2021-03-05 LAB — GLUCOSE, CAPILLARY
Glucose-Capillary: 140 mg/dL — ABNORMAL HIGH (ref 70–99)
Glucose-Capillary: 154 mg/dL — ABNORMAL HIGH (ref 70–99)
Glucose-Capillary: 173 mg/dL — ABNORMAL HIGH (ref 70–99)
Glucose-Capillary: 187 mg/dL — ABNORMAL HIGH (ref 70–99)

## 2021-03-05 LAB — CK: Total CK: 34 U/L — ABNORMAL LOW (ref 49–397)

## 2021-03-05 LAB — CBC
HCT: 25.3 % — ABNORMAL LOW (ref 39.0–52.0)
Hemoglobin: 8.1 g/dL — ABNORMAL LOW (ref 13.0–17.0)
MCH: 28.4 pg (ref 26.0–34.0)
MCHC: 32 g/dL (ref 30.0–36.0)
MCV: 88.8 fL (ref 80.0–100.0)
Platelets: 303 10*3/uL (ref 150–400)
RBC: 2.85 MIL/uL — ABNORMAL LOW (ref 4.22–5.81)
RDW: 13.5 % (ref 11.5–15.5)
WBC: 9.6 10*3/uL (ref 4.0–10.5)
nRBC: 0 % (ref 0.0–0.2)

## 2021-03-05 MED ORDER — HEPARIN SODIUM (PORCINE) 5000 UNIT/ML IJ SOLN
5000.0000 [IU] | Freq: Three times a day (TID) | INTRAMUSCULAR | Status: AC
  Administered 2021-03-06 (×2): 5000 [IU] via SUBCUTANEOUS
  Filled 2021-03-05 (×2): qty 1

## 2021-03-05 MED ORDER — HEPARIN SODIUM (PORCINE) 5000 UNIT/ML IJ SOLN: 5000.0000 [IU] | Freq: Three times a day (TID) | INTRAMUSCULAR | Status: DC

## 2021-03-05 NOTE — Progress Notes (Signed)
PT Cancellation Note  Patient Details Name: Adam James MRN: [id number] DOB: 07-05-1971   Cancelled Treatment:    Reason Eval/Treat Not Completed: Patient declined, no reason specified. Pt refusing PT intervention, reports he has already been out of the bed today and is tired at this time. Pt reports he has too much on his mind currently to participate in therapy, concerned about the possibility of losing his left leg. Pt refusing PT until at least after planned procedure on Friday 8/12. PT will follow up after.  Arlyss Gandy 03/05/2021, 2:05 PM

## 2021-03-05 NOTE — Progress Notes (Signed)
Pharmacy Antibiotic Note  Adam James is a 50 y.o. male admitted on 02/24/2021 with  wound infection .  Pharmacy has been consulted for Dapto, Unasyn dosing.   ID: LLE gas gangrene nec fasc + RLE foot ulcer. Afebrile. wBC WNL. Scr rising MRI>>likely infectious myositis/fasciitis. No osteo.   - 8/8: I&D. Repeat 8/12  8/1 zosyn>>8/2 8/1 vanc>>8/6 8/2 cefepime>>8/4 8/4 Unasyn> 8/7 Dapto>  8/6 CK: 50 8/10 CK: 34  8/1 wound>>MRSA and GBS - abundant step agalactiae, rare staph aureus  8/1 blood>>2/4 bottles staph epi/hominis 8/3 tissue>> rare strep agalactiae, RARE FINEGOLDIA MAGNA  Plan: Daptomycin 8mg /kg daily (adjust for CrCl<30) Unasyn 3 gm IV Q 6 hr (adjust for CrCl<30) Hold statin while on Dapto -f/u restart Monitor renal function, CK Q Wed,       Height: 6\' 3"  (190.5 cm) Weight: 119.9 kg (264 lb 5.3 oz) IBW/kg (Calculated) : 84.5  Temp (24hrs), Avg:98.3 F (36.8 C), Min:97.6 F (36.4 C), Max:99 F (37.2 C)  Recent Labs  Lab 03/01/21 0451 03/02/21 0244 03/03/21 0249 03/04/21 0211 03/05/21 0427  WBC 11.1* 10.0 10.7* 11.2* 9.6  CREATININE 1.77* 2.09* 2.44* 2.88* 3.42*    Estimated Creatinine Clearance: 36.9 mL/min (A) (by C-G formula based on SCr of 3.42 mg/dL (H)).    Allergies  Allergen Reactions   Morphine Itching    Lillyan Hitson S. 05/04/21, PharmD, BCPS Clinical Staff Pharmacist [email or web address]  05/05/21 03/05/2021 9:03 AM

## 2021-03-05 NOTE — Progress Notes (Signed)
Occupational Therapy Treatment Patient Details Name: Adam James MRN: [id number] DOB: [birth year] Today's Date: 03/05/2021    History of present illness Pt is a 50 y.o. male admitted 02/24/21 with bilateral foot wounds. Pt with L foot diabetic infection with gas gangrene, R foot diabetic ulcer. Ortho workup reports LLE diabetic insensate neuropathy with necrotizing fasciitis involving the L anterior/lateral compartments and dorsum of L foot. S/p L foot I&D 8/1. S/p additional L foot and lower leg I&D with wound vac placement 8/3. Plan for LLE limb salvage with serial debridements next week. PMH includes DM2, neuropathy, CAD, cardiomyopathy.   OT comments  On OT entry, pt had just transferred self to/from [facility] and completed toileting task without assist. Pt initially pleasant but becomes easily agitated with questions/suggestions regarding ADLs/mobility and home setup. Attempted to problem solving toileting tasks at home either using BSC or improving capabilities to hop in/out of bathroom using RW as pt reports bathroom is not wheelchair accessible at home. Pt reports fatigue from [facility] transfer, as well as LLE pain and declined RW standing attempts today. Pt reports he is "not even thinking about any of that other stuff (DC planning) and only worried about the infection".  Pt reports preference to not work with therapy until after next surgery. Reducing frequency to 1x/wk due to poor participation. Based on ability to complete basic transfers/toileting without assist, may not need post acute rehab. Will continue to follow and update frequency/DC recs as appropriate.    Follow Up Recommendations  Other (comment) (TBD)    Equipment Recommendations  3 in 1 bedside commode;Other (comment);Wheelchair (measurements OT);Wheelchair cushion (measurements OT) (Rolling walker)    Recommendations for Other Services      Precautions / Restrictions Precautions Precautions: Fall;Other (comment) Precaution  Comments: LLE wound vac Restrictions Weight Bearing Restrictions: Yes LLE Weight Bearing: Non weight bearing       Mobility Bed Mobility               General bed mobility comments: had gotten self back in bed from [facility] without assist prior to OT entry    Transfers                 General transfer comment: per pt, had transferred self to/from [facility] without assist and with using only R LE    Balance                                           ADL either performed or assessed with clinical judgement   ADL Overall ADL's : Needs assistance/impaired                                       General ADL Comments: Per pt/RN, had just transferred to/from [facility] without assist (does not want assist for personal ADLs). Pt reports able to transfer with R LE only though it wears him out. Pt reports able to perform hygiene without much issue. Attempted to discuss toileting options at home, as pt reports wheelchair may not fit in bathroom. Educated on [facility] use, easier BSC cleanup at home though he is not sure he would be able to do this. Discussed using RW to hop in/out of bathroom with pt reporting weak UE though L rotator cuff issues. Asked if he would like theraband to work  on strength in bed, initially interested but became agitated with further suggestions/questions for ADLs/mobiltiy     Vision   Vision Assessment?: No apparent visual deficits   Perception     Praxis      Cognition Arousal/Alertness: Awake/alert Behavior During Therapy: WFL for tasks assessed/performed;Anxious;Agitated Overall Cognitive Status: No family/caregiver present to determine baseline cognitive functioning                                 General Comments: WFL cognitively; not receptive to feedback or suggestions regarding mobility reports he is not thinking about how he will get up the stairs at home, etc. becomes agitated easily        Exercises      Shoulder Instructions       General Comments Agreeable to allow OT to assist in placing prevalon boot to L LE    Pertinent Vitals/ Pain       Pain Assessment: Faces Faces Pain Scale: Hurts even more Pain Location: L lower leg when placing in prevalon boot Pain Descriptors / Indicators: Grimacing;Guarding;Discomfort;Moaning Pain Intervention(s): Monitored during session;Premedicated before session;Patient requesting pain meds-RN notified  Home Living                                          Prior Functioning/Environment              Frequency  Min 1X/week        Progress Toward Goals  OT Goals(current goals can now be found in the care plan section)  Progress towards OT goals: OT to reassess next treatment  Acute Rehab OT Goals Patient Stated Goal: Decrease pain, save L LE OT Goal Formulation: With patient Time For Goal Achievement: 03/13/21 Potential to Achieve Goals: Good ADL Goals Pt Will Perform Lower Body Bathing: with supervision;sitting/lateral leans Pt Will Perform Lower Body Dressing: with supervision;sitting/lateral leans Pt Will Transfer to Toilet: with min guard assist;squat pivot transfer;stand pivot transfer;bedside commode Additional ADL Goal #1: Pt to demonstrate full sit to stand transfer with no more than Mod A in prep for ADL transfers  Plan Discharge plan needs to be updated    Co-evaluation                 AM-PAC OT "6 Clicks" Daily Activity     Outcome Measure   Help from another person eating meals?: None Help from another person taking care of personal grooming?: None Help from another person toileting, which includes using toliet, bedpan, or urinal?: A Little Help from another person bathing (including washing, rinsing, drying)?: A Little Help from another person to put on and taking off regular upper body clothing?: A Little Help from another person to put on and taking off regular lower body clothing?: A  Little 6 Click Score: 20    End of Session    OT Visit Diagnosis: Unsteadiness on feet (R26.81);Other abnormalities of gait and mobility (R26.89);Pain Pain - Right/Left: Left Pain - part of body: Ankle and joints of foot   Activity Tolerance Treatment limited secondary to agitation;Patient limited by pain;Patient limited by fatigue   Patient Left in bed;with call bell/phone within reach   Nurse Communication Mobility status;Patient requests pain meds        Time: 5053-9767 OT Time Calculation (min): 14 min  Charges: OT General Charges $OT  Visit: 1 Visit OT Treatments $Self Care/Home Management : 8-22 mins  Bradd Canary, OTR/L Acute Rehab Services Office: [phone]    Lorre Munroe 03/05/2021, 12:15 PM

## 2021-03-05 NOTE — Plan of Care (Signed)

## 2021-03-05 NOTE — Progress Notes (Signed)
HD#10 SUBJECTIVE:  Patient Summary: Adam James is a 50 y.o. with a pertinent PMH of diabetes, ischemic heart disease, chronic heart failure with reduced EF, and CKD IIIa who presented s/p irrigation and debridement for left foot diabetic infection with gas gangrene and necrotizing fasciitis  Overnight Events: No acute events overnight  Interim History: This is hospital day 10 for Adam James who was seen and evaluated at the bedside this morning. He states that he is still having pain, however, it is better controlled today when compared to yesterday. He also states that he is no longer having blood in his urine and he has been able to urinate without any issues.   OBJECTIVE:  Vital Signs: Vitals:   03/04/21 2003 03/04/21 2325 03/05/21 0400 03/05/21 0505  BP: (!) 143/96 (!) 158/90  125/89  Pulse: 90 91  84  Resp: 19 20  19   Temp: 99 F (37.2 C) 98.3 F (36.8 C)  97.6 F (36.4 C)  TempSrc:  Oral    SpO2: 97% 97%  96%  Weight:   119.9 kg   Height:       Supplemental O2: Room Air SpO2: 96 % O2 Flow Rate (L/min): 0 L/min FiO2 (%): 21 %  Filed Weights   03/03/21 0402 03/04/21 0548 03/05/21 0400  Weight: 117.1 kg 118.6 kg 119.9 kg     Intake/Output Summary (Last 24 hours) at 03/05/2021 0558 Last data filed at 03/04/2021 2337 Gross per 24 hour  Intake 1312 ml  Output 800 ml  Net 512 ml   Net IO Since Admission: 5,051.62 mL [03/05/21 0558]  Physical Exam: General: Patient is not in any acute distress CV: RRR. No murmurs, rubs, or gallops. No LE edema. No JVD Pulmonary: Lungs CTAB. Normal effort. No wheezing or rales. Extremities: Palpable radial and DP pulses on the right. 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 2cm ulcer on plantar aspect of R foot under the base of the 4th-5th metatarsal, surrounding skin is necrotic, wrapped in gauze. Unable to assess LLE due to post-op dressing that extends up to patient's knee and wound vac in place with ~350cc serosanguinous fluid  output  Skin: Warm and dry. No obvious rash or lesions. Some sacral edema noted. Neuro: A&Ox3. Reduced sensation in lower extremities. No focal deficit. Psych: Normal mood and affect give patient situation     ASSESSMENT/PLAN:  Assessment: Principal Problem:   Gangrene of left foot (HCC) Active Problems:   Cardiomyopathy (HCC)   Type 2 diabetes mellitus (HCC)   Diabetic foot ulcer (HCC)   CAD (coronary artery disease)   Necrotizing fasciitis (HCC)   Left leg cellulitis   Diabetic polyneuropathy associated with type 2 diabetes mellitus (HCC)   Severe protein-calorie malnutrition (HCC)   Plan: #Left sided diabetic foot infection with gas gangrene and necrotizing fasciitis Patient is postoperative day 2 after having repeat irrigation and debridement with ortho on 8/8. Wound vac remains in place and noted to have ~350cc of serosanguinous fluid this morning. White blood cell count remains stable and the patient continues to receive IV antibiotics as below. CK level checked in the setting of daptomycin usage and was within normal limits. Will return to the OR for repeat excisional debridement on 8/12 with Dr. 10/12 - ID consulted, appreciate recommendations  - Continue Daptomycin and Unasyn - Will likely need long term antibiotics and PICC placement prior to discharge - OR later on 8/12 with Dr. 10/12 for repeat debridement - Monitor wound vac output - Prevalon boot placed  to reduce risk of ankle contractures - Attempt to work with PT/OT as able  #Acute on chronic renal failure #Acute tubular necrosis Patient has CKD IIIa at his baseline and has had worsening of his kidney function since his surgeries. Cr continues to rise, up to 3.42 today. He has not been receiving IV fluids as he is euvolemic at this time and we do not want to volume overload him with his history of HFrEF. Will hold off on fluids today to avoid volume overload. Could consider a nephrology consult if his creatinine does  not improve in a day or two.  - Continue to monitor renal function - Avoid nephrotoxic agents  #Type 2 Diabetes with diabetic neuropathy HbA1c 8.8. Patient receiving 15U Lantus with 8U Novolog TID with meals and SSI. Blood sugars been between 130s-160s in the last day. Blood sugar goal remains <180. - Insulin glargine to 15 units qhs with SSI - Novolog 8U TID with meals  - Continue CBG monitoring   #Chronic HFrEF #Ischemic heart disease Patient continues to deny any chest pain, SOB, orthopnea, or lower extremity edema, although his weight is up since admission. He has not received lasix, irbesartan, and amlodipine in the setting of hypotension. Restarting irbesartan was considered, as the patient's blood pressures have been rising, however, it has not be restarted in the setting of his worsening renal function. - Continue metoprolol 25 mg daily  - Continue aspirin - Holding atorvastatin while patient receives daptomycin to minimize risk of myopathy, will resume after antibiotic course is finished - Continue to monitor CBC, transfuse if Hb <8  Signature: Rhianon Zabawa, D.O.  Internal Medicine Resident, PGY-1 [facility]  Pager: [phone] 5:58 AM, 03/05/2021   Please contact the on call pager after 5 pm and on weekends at [phone].

## 2021-03-05 NOTE — Progress Notes (Signed)
Patient is postop day 2 status post repeat irrigation and debridement.  He is sitting up in bed anxious to talk to Dr. Lajoyce Corners about his neck surgery on Friday  Vital signs stable he has 350 cc of blood tinged fluid in his wound VAC and it is functioning well   Plan for repeat excisional debridement on Friday with Dr. Lajoyce Corners

## 2021-03-06 LAB — GLUCOSE, CAPILLARY
Glucose-Capillary: 187 mg/dL — ABNORMAL HIGH (ref 70–99)
Glucose-Capillary: 161 mg/dL — ABNORMAL HIGH (ref 70–99)
Glucose-Capillary: 127 mg/dL — ABNORMAL HIGH (ref 70–99)
Glucose-Capillary: 129 mg/dL — ABNORMAL HIGH (ref 70–99)

## 2021-03-06 LAB — BASIC METABOLIC PANEL
Anion gap: 9 (ref 5–15)
BUN: 36 mg/dL — ABNORMAL HIGH (ref 6–20)
CO2: 20 mmol/L — ABNORMAL LOW (ref 22–32)
Calcium: 7.6 mg/dL — ABNORMAL LOW (ref 8.9–10.3)
Chloride: 103 mmol/L (ref 98–111)
Creatinine, Ser: 3.41 mg/dL — ABNORMAL HIGH (ref 0.61–1.24)
GFR, Estimated: 21 mL/min — ABNORMAL LOW (ref >60)
Glucose, Bld: 179 mg/dL — ABNORMAL HIGH (ref 70–99)
Potassium: 4.5 mmol/L (ref 3.5–5.1)
Sodium: 132 mmol/L — ABNORMAL LOW (ref 135–145)

## 2021-03-06 LAB — CBC
HCT: 26.3 % — ABNORMAL LOW (ref 39.0–52.0)
Hemoglobin: 8.5 g/dL — ABNORMAL LOW (ref 13.0–17.0)
MCH: 28.1 pg (ref 26.0–34.0)
MCHC: 32.3 g/dL (ref 30.0–36.0)
MCV: 86.8 fL (ref 80.0–100.0)
Platelets: 318 10*3/uL (ref 150–400)
RBC: 3.03 MIL/uL — ABNORMAL LOW (ref 4.22–5.81)
RDW: 13.2 % (ref 11.5–15.5)
WBC: 10.2 10*3/uL (ref 4.0–10.5)
nRBC: 0 % (ref 0.0–0.2)

## 2021-03-06 LAB — AEROBIC/ANAEROBIC CULTURE W GRAM STAIN (SURGICAL/DEEP WOUND)

## 2021-03-06 NOTE — H&P (View-Only) (Signed)
Patient ID: Adam James, male   DOB: 01-02-1971, 50 y.o.   MRN: [id number] Patient is a 50 year old gentleman is status post repeat debridement of the left leg for left limb salvage.  The wound VAC is functioning well with minimal drainage.  We will plan for repeat debridement tomorrow.  Again discussed that without his anterior compartment patient will need braces chronically will not be able to lift up his foot or his toes.  Discussed that functionally he would be able to ambulate better with an above-the-knee prosthesis.  Patient states he understands wished to proceed with continued limb salvage intervention.

## 2021-03-06 NOTE — Plan of Care (Signed)

## 2021-03-06 NOTE — Progress Notes (Signed)
[facility] Infectious Diseases Follow Up Note  Patient Identification: Patient Name: Adam James MRN: [id number] Admit Date: 02/24/2021  4:40 PM Age: 50 y.o.Today's Date: 03/06/2021   Reason for Visit: DFU/osteomyelitis  Principal Problem:   Gangrene of left foot (HCC) Active Problems:   Cardiomyopathy (HCC)   Type 2 diabetes mellitus (HCC)   Diabetic foot ulcer (HCC)   CAD (coronary artery disease)   Necrotizing fasciitis (HCC)   Left leg cellulitis   Diabetic polyneuropathy associated with type 2 diabetes mellitus (HCC)   Severe protein-calorie malnutrition (HCC)  Antibiotics: Vancomycin 8/1- 8/5, daptomycin 8/7-current                     Pip/tazo 8/1, cefepime 8/2-8/3, Unasyn 8/4-current    Lines/Tubes: Left foot drain, Left wound vac, PIVs    Interval Events: Patient continues to be afebrile, no leukocytosis Went to the OR for repeat excisional debridement of left leg and left foot with application of wound VAC on 8/8 with Dr. Roda Shutters. 8/1 cultures are polymicrobial, vancomycin changed to daptomycin due to elevated creatinine Plan for repeat debridement on 8/12 for limb salvage  Assessment Necrotizing fasciitis of the left foot and left leg Left DFU/osteomyelitis/septic arthritis/abscesses(fifth MTP joint and fourth metatarsal) Small ulcer at the base of right fourth/fifth metatarsal-look superficial and wound care following Uncontrolled diabetes mellitus CKD: Cr uptrending, vanc has been changed to daptomycin  History of smoking   Recommendations Continue daptomycin and Unasyn as is.  Patient continues to decline amputation and opts for limb salvage.  He is scheduled to go for another debridement with orthopedics tomorrow. Will need a PICC line for long-term antibiotics, 6 weeks from the date of his last OR Follow-up OR note from tomorrow and cultures  Monitor CBC BMP and CPK Plan discussed with patient/ID pharmacy  and primary  Rest of the management as per the primary team. Thank you for the consult. Please page with pertinent questions or concerns.  ______________________________________________________________________ Subjective patient seen and examined at the bedside.  Patient is lying in bed.  He continues to want to salvage his left leg.  Denies any fever, chills and sweats.  Denies any nausea, vomiting and diarrhea.   Vitals BP (!) 138/98   Pulse 92   Temp 97.6 F (36.4 C) (Oral)   Resp 18   Ht 6\' 3"  (1.905 m)   Wt 120 kg   SpO2 98%   BMI 33.07 kg/m     Physical Exam Constitutional: Appears comfortable and lying in bed    Comments:   Cardiovascular:     Rate and Rhythm: Normal rate and regular rhythm.     Heart sounds:    Pulmonary:     Effort: Pulmonary effort is normal.     Comments: On room air  Abdominal:     Palpations: Abdomen is soft.     Tenderness: Nontender and nondistended  Musculoskeletal:        General: left leg is bandaged entirely, able to wriggles left toes, small superficial wound at the base of little toe in rt foot  Skin:    Comments: No lesions or rashes  Neurological:     General: No focal deficit present.   Psychiatric:        Mood and Affect: Mood normal.   Pertinent Microbiology Results for orders placed or performed during the hospital encounter of 02/24/21  Blood culture (routine x 2)     Status: None   Collection Time: 02/24/21  5:17  PM   Specimen: BLOOD  Result Value Ref Range Status   Specimen Description BLOOD SITE NOT SPECIFIED  Final   Special Requests   Final    BOTTLES DRAWN AEROBIC AND ANAEROBIC Blood Culture adequate volume   Culture   Final    NO GROWTH 5 DAYS Performed at [facility], [address] [place], [place] [address]    Report Status 03/01/2021 FINAL  Final  Blood culture (routine x 2)     Status: Abnormal   Collection Time: 02/24/21  5:22 PM   Specimen: BLOOD  Result Value Ref Range Status    Specimen Description BLOOD RIGHT ANTECUBITAL  Final   Special Requests   Final    BOTTLES DRAWN AEROBIC AND ANAEROBIC Blood Culture results may not be optimal due to an inadequate volume of blood received in culture bottles   Culture  Setup Time   Final    GRAM POSITIVE COCCI IN CLUSTERS IN BOTH AEROBIC AND ANAEROBIC BOTTLES CRITICAL RESULT CALLED TO, READ BACK BY AND VERIFIED WITH: PHARM D A.LAWLESS ON [id number] AT 1255 BY E.PARRISH    Culture (A)  Final    STAPHYLOCOCCUS HOMINIS STAPHYLOCOCCUS EPIDERMIDIS THE SIGNIFICANCE OF ISOLATING THIS ORGANISM FROM A SINGLE SET OF BLOOD CULTURES WHEN MULTIPLE SETS ARE DRAWN IS UNCERTAIN. PLEASE NOTIFY THE MICROBIOLOGY DEPARTMENT WITHIN ONE WEEK IF SPECIATION AND SENSITIVITIES ARE REQUIRED. Performed at [facility], [address] [place], [place] [address]    Report Status 02/27/2021 FINAL  Final  Blood Culture ID Panel (Reflexed)     Status: Abnormal   Collection Time: 02/24/21  5:22 PM  Result Value Ref Range Status   Enterococcus faecalis NOT DETECTED NOT DETECTED Final   Enterococcus Faecium NOT DETECTED NOT DETECTED Final   Listeria monocytogenes NOT DETECTED NOT DETECTED Final   Staphylococcus species DETECTED (A) NOT DETECTED Final    Comment: CRITICAL RESULT CALLED TO, READ BACK BY AND VERIFIED WITH: PHARM D A.LAWLESS ON [id number] AT 1255 BY E.PARRISH    Staphylococcus aureus (BCID) NOT DETECTED NOT DETECTED Final   Staphylococcus epidermidis DETECTED (A) NOT DETECTED Final    Comment: Methicillin (oxacillin) resistant coagulase negative staphylococcus. Possible blood culture contaminant (unless isolated from more than one blood culture draw or clinical case suggests pathogenicity). No antibiotic treatment is indicated for blood  culture contaminants. CRITICAL RESULT CALLED TO, READ BACK BY AND VERIFIED WITH: PHARM D A.LAWLESS ON [id number] AT 1255 BY E.PARRISH    Staphylococcus lugdunensis NOT DETECTED NOT DETECTED Final    Streptococcus species NOT DETECTED NOT DETECTED Final   Streptococcus agalactiae NOT DETECTED NOT DETECTED Final   Streptococcus pneumoniae NOT DETECTED NOT DETECTED Final   Streptococcus pyogenes NOT DETECTED NOT DETECTED Final   A.calcoaceticus-baumannii NOT DETECTED NOT DETECTED Final   Bacteroides fragilis NOT DETECTED NOT DETECTED Final   Enterobacterales NOT DETECTED NOT DETECTED Final   Enterobacter cloacae complex NOT DETECTED NOT DETECTED Final   Escherichia coli NOT DETECTED NOT DETECTED Final   Klebsiella aerogenes NOT DETECTED NOT DETECTED Final   Klebsiella oxytoca NOT DETECTED NOT DETECTED Final   Klebsiella pneumoniae NOT DETECTED NOT DETECTED Final   Proteus species NOT DETECTED NOT DETECTED Final   Salmonella species NOT DETECTED NOT DETECTED Final   Serratia marcescens NOT DETECTED NOT DETECTED Final   Haemophilus influenzae NOT DETECTED NOT DETECTED Final   Neisseria meningitidis NOT DETECTED NOT DETECTED Final   Pseudomonas aeruginosa NOT DETECTED NOT DETECTED Final   Stenotrophomonas maltophilia NOT DETECTED  NOT DETECTED Final   Candida albicans NOT DETECTED NOT DETECTED Final   Candida auris NOT DETECTED NOT DETECTED Final   Candida glabrata NOT DETECTED NOT DETECTED Final   Candida krusei NOT DETECTED NOT DETECTED Final   Candida parapsilosis NOT DETECTED NOT DETECTED Final   Candida tropicalis NOT DETECTED NOT DETECTED Final   Cryptococcus neoformans/gattii NOT DETECTED NOT DETECTED Final   Methicillin resistance mecA/C DETECTED (A) NOT DETECTED Final    Comment: CRITICAL RESULT CALLED TO, READ BACK BY AND VERIFIED WITH: PHARM D A.LAWLESS ON [id number] AT 1255 BY E.PARRISH Performed at [facility], [address] [place], [place] [address]   Fungus Culture With Stain     Status: None (Preliminary result)   Collection Time: 02/24/21 10:54 PM   Specimen: Soft Tissue, Other  Result Value Ref Range Status   Fungus Stain Final report  Final    Comment:  (NOTE) Performed At: [facility] [address] [place], [place] [id number] Jolene Schimke MD WL:[id number]    Fungus (Mycology) Culture PENDING  Incomplete   Fungal Source ABSCESS  Final    Comment: LEFT FOOT ON VANCOMYCIN,ZOSYN Performed at [facility], [address], [place], [place] [address]   Aerobic/Anaerobic Culture w Gram Stain (surgical/deep wound)     Status: None (Preliminary result)   Collection Time: 02/24/21 10:54 PM   Specimen: Soft Tissue, Other  Result Value Ref Range Status   Specimen Description ABSCESS LEFT FOOT  Final   Special Requests VANCOMYCIN,ZOSYN  Final   Gram Stain   Final    RARE WBC PRESENT,BOTH PMN AND MONONUCLEAR FEW GRAM POSITIVE COCCI IN PAIRS    Culture   Final    ABUNDANT STREPTOCOCCUS AGALACTIAE TESTING AGAINST S. AGALACTIAE NOT ROUTINELY PERFORMED DUE TO PREDICTABILITY OF AMP/PEN/VAN SUSCEPTIBILITY. RARE METHICILLIN RESISTANT STAPHYLOCOCCUS AUREUS WITHIN MIXED ORGANISMS NO ANAEROBES ISOLATED Performed at [facility], [address], [place], [place] [address]    Report Status PENDING  Incomplete   Organism ID, Bacteria METHICILLIN RESISTANT STAPHYLOCOCCUS AUREUS  Final      Susceptibility   Methicillin resistant staphylococcus aureus - MIC*    CIPROFLOXACIN >=8 RESISTANT Resistant     ERYTHROMYCIN >=8 RESISTANT Resistant     GENTAMICIN <=0.5 SENSITIVE Sensitive     OXACILLIN >=4 RESISTANT Resistant     TETRACYCLINE <=1 SENSITIVE Sensitive     VANCOMYCIN 1 SENSITIVE Sensitive     TRIMETH/SULFA 160 RESISTANT Resistant     CLINDAMYCIN <=0.25 SENSITIVE Sensitive     RIFAMPIN <=0.5 SENSITIVE Sensitive     Inducible Clindamycin NEGATIVE Sensitive     * RARE METHICILLIN RESISTANT STAPHYLOCOCCUS AUREUS  Fungus Culture Result     Status: None   Collection Time: 02/24/21 10:54 PM  Result Value Ref Range Status   Result 1 Comment  Final    Comment: (NOTE) KOH/Calcofluor preparation:  no fungus observed. Performed  At: [facility] [address] [place], [place] [id number] Jolene Schimke MD GT:[id number]   Aerobic/Anaerobic Culture w Gram Stain (surgical/deep wound)     Status: None   Collection Time: 02/26/21  3:36 PM   Specimen: Wound; Tissue  Result Value Ref Range Status   Specimen Description TISSUE  Final   Special Requests RIGHT LEG WOUND TISSUE SPEC A  Final   Gram Stain   Final    FEW WBC PRESENT, PREDOMINANTLY PMN NO ORGANISMS SEEN    Culture   Final    RARE STREPTOCOCCUS AGALACTIAE TESTING AGAINST  S. AGALACTIAE NOT ROUTINELY PERFORMED DUE TO PREDICTABILITY OF AMP/PEN/VAN SUSCEPTIBILITY. CRITICAL RESULT CALLED TO, READ BACK BY AND VERIFIED WITH: RN C.WELLS AT 1241 ON 02/27/2021 BY T.SAAD. RARE FINEGOLDIA MAGNA RARE CLOSTRIDIUM CLOSTRIDIOFORME CRITICAL RESULT CALLED TO, READ BACK BY AND VERIFIED WITH: RN Aldona Lento [phone] FCP Performed at [facility], [address] [place], [place] [address]    Report Status 03/05/2021 FINAL  Final    Pertinent Lab. CBC Latest Ref Rng & Units 03/06/2021 03/05/2021 03/04/2021  WBC 4.0 - 10.5 K/uL 10.2 9.6 11.2(H)  Hemoglobin 13.0 - 17.0 g/dL 6.9(S) 8.1(L) 8.4(L)  Hematocrit 39.0 - 52.0 % 26.3(L) 25.3(L) 25.9(L)  Platelets 150 - 400 K/uL 318 303 308   CMP Latest Ref Rng & Units 03/06/2021 03/05/2021 03/04/2021  Glucose 70 - 99 mg/dL 854(O) 270(J) 500(X)  BUN 6 - 20 mg/dL 38(H) 82(X) 93(Z)  Creatinine 0.61 - 1.24 mg/dL 1.69(C) 7.89(F) 8.10(F)  Sodium 135 - 145 mmol/L 132(L) 130(L) 130(L)  Potassium 3.5 - 5.1 mmol/L 4.5 4.8 4.3  Chloride 98 - 111 mmol/L 103 101 103  CO2 22 - 32 mmol/L 20(L) 22 21(L)  Calcium 8.9 - 10.3 mg/dL 7.6(L) 7.8(L) 7.7(L)  Total Protein 6.5 - 8.1 g/dL - - -  Total Bilirubin 0.3 - 1.2 mg/dL - - -  Alkaline Phos 38 - 126 U/L - - -  AST 15 - 41 U/L - - -  ALT 0 - 44 U/L - - -    Pertinent Imaging today Plain films and CT images have been personally visualized and interpreted; radiology reports have been  reviewed. Decision making incorporated into the Impression / Recommendations.  I spent more than 35 minutes for this patient encounter including review of prior medical records, coordination of care  with greater than 50% of time being face to face/counseling and discussing diagnostics/treatment plan with the patient/family.  Electronically signed by:   Odette Fraction, MD Infectious Disease Physician [facility] Pager: [phone]

## 2021-03-06 NOTE — Progress Notes (Signed)
Patient ID: Adam James, male   DOB: 05/12/1971, 49 y.o.   MRN: [id number] Patient is a 49-year-old gentleman is status post repeat debridement of the left leg for left limb salvage.  The wound VAC is functioning well with minimal drainage.  We will plan for repeat debridement tomorrow.  Again discussed that without his anterior compartment patient will need braces chronically will not be able to lift up his foot or his toes.  Discussed that functionally he would be able to ambulate better with an above-the-knee prosthesis.  Patient states he understands wished to proceed with continued limb salvage intervention. 

## 2021-03-06 NOTE — Progress Notes (Signed)
HD#11 SUBJECTIVE:  Patient Summary: Adam James is a 50 y.o. with a pertinent PMH of diabetes, ischemic heart disease, chronic heart failure with reduced EF, and CKD IIIa who presented s/p irrigation and debridement for left foot diabetic infection with gas gangrene and necrotizing fasciitis  Overnight Events: No acute events overnight  Interim History: This is hospital day 6 for Mr. Prieur who was seen and evaluated at the bedside this morning. His pain remains well controlled. He reports that ortho discussed that he further limb salvage interventions are possible, but that he should begin to think about amputation, as well. He wishes to proceed with the limb salvage intervention tomorrow.   OBJECTIVE:  Vital Signs: Vitals:   03/05/21 1952 03/06/21 0030 03/06/21 0409 03/06/21 0611  BP: (!) 146/86 130/75  (!) 157/95  Pulse: 84 74  92  Resp: 19 15  18   Temp: 98 F (36.7 C) 98.2 F (36.8 C)  97.6 F (36.4 C)  TempSrc: Oral Oral  Oral  SpO2: 95% 96%  98%  Weight:   120 kg   Height:       Supplemental O2: Room Air SpO2: 98 % O2 Flow Rate (L/min): 0 L/min FiO2 (%): 21 %  Filed Weights   03/04/21 0548 03/05/21 0400 03/06/21 0409  Weight: 118.6 kg 119.9 kg 120 kg     Intake/Output Summary (Last 24 hours) at 03/06/2021 0648 Last data filed at 03/06/2021 0616 Gross per 24 hour  Intake 1643 ml  Output 1625 ml  Net 18 ml   Net IO Since Admission: 5,069.62 mL [03/06/21 0648]  Physical Exam: General: Patient is not in any acute distress CV: RRR. No murmurs, rubs, or gallops. No LE edema. No JVD Pulmonary: Lungs CTAB. Normal effort. No wheezing or rales. Extremities: Palpable radial and DP pulses on the right. 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 2cm ulcer on plantar aspect of R foot under the base of the 4th-5th metatarsal, surrounding skin is necrotic, wrapped in gauze. Unable to assess LLE due to post-op dressing that extends up to patient's knee and wound vac remains in  place Skin: Warm and dry. No obvious rash or lesions. Some sacral edema noted. Neuro: A&Ox3. Reduced sensation in lower extremities. No focal deficit. Psych: Normal mood and affect give patient situation      ASSESSMENT/PLAN:  Assessment: Principal Problem:   Gangrene of left foot (HCC) Active Problems:   Cardiomyopathy (HCC)   Type 2 diabetes mellitus (HCC)   Diabetic foot ulcer (HCC)   CAD (coronary artery disease)   Necrotizing fasciitis (HCC)   Left leg cellulitis   Diabetic polyneuropathy associated with type 2 diabetes mellitus (HCC)   Severe protein-calorie malnutrition (HCC)   Plan: #Left sided diabetic foot infection with gas gangrene and necrotizing fasciitis Patient is postoperative day 3 after having repeat irrigation and debridement with ortho on 8/8. He is going to the OR for a repeat limb salvage intervention tomorrow.White blood cell count remains stable and the patient continues to receive IV antibiotics as below.  - ID consulted, appreciate recommendations  - Continue Daptomycin and Unasyn - Will likely need long term antibiotics and PICC placement prior to discharge pending renal function and surgery findings - OR on 8/12 with Dr. 10/12 for repeat debridement - Monitor wound vac output - Prevalon boot placed to reduce risk of ankle contractures - Attempt to work with PT/OT as able  #Acute on chronic renal failure #Acute tubular necrosis, likely Patient has CKD IIIa at his baseline  and has had worsening of his kidney function since his surgeries. Cr remained stable today, at 3.41. He has not been receiving IV fluids as he is mildly hypervolemic at this time and we do not want to volume overload him with his history of HFrEF. He does have some third spacing around his sacrum and his weight is up about 10 kg since admission. He continues to produce urine and denies any further episodes of hematuria at this time. Could consider a nephrology consult if his creatinine  worsens.  - Continue to monitor renal function - Avoid nephrotoxic agents - Supportive treatment  #Type 2 Diabetes with diabetic neuropathy HbA1c 8.8. Blood sugars been between 170s-180s in the last day. Blood sugar goal remains <180. - Continue Insulin glargine to 15 units qhs, Novolog 8U TID with meals, and SSI - Continue CBG monitoring   Best Practice: Diet: Carb modified diet, NPO at midnight IVF: none VTE: heparin TID, holding evening dose Code: Full AB: Daptomycin/Unasyn Therapy Recs: PT/OT recommending CIR DISPO: Anticipated discharge pending  clinical improvement .  Signature: Elza Rafter, D.O.  Internal Medicine Resident, PGY-1 [facility]  Pager: [phone] 6:48 AM, 03/06/2021   Please contact the on call pager after 5 pm and on weekends at [phone].

## 2021-03-07 ENCOUNTER — Inpatient Hospital Stay (HOSPITAL_COMMUNITY): Payer: Self-pay | Admitting: Anesthesiology

## 2021-03-07 ENCOUNTER — Encounter (HOSPITAL_COMMUNITY): Payer: Self-pay | Admitting: Student in an Organized Health Care Education/Training Program

## 2021-03-07 ENCOUNTER — Encounter (HOSPITAL_COMMUNITY)
Admission: EM | Disposition: A | Discharge status: Home Health | Payer: Self-pay | Source: Home / Self Care | Attending: Student in an Organized Health Care Education/Training Program

## 2021-03-07 HISTORY — PX: I & D EXTREMITY: SHX5045

## 2021-03-07 HISTORY — PX: APPLICATION OF WOUND VAC: SHX5189

## 2021-03-07 LAB — POCT I-STAT, CHEM 8
BUN: 28 mg/dL — ABNORMAL HIGH (ref 6–20)
Calcium, Ion: 1.11 mmol/L — ABNORMAL LOW (ref 1.15–1.40)
Chloride: 108 mmol/L (ref 98–111)
Creatinine, Ser: 2.8 mg/dL — ABNORMAL HIGH (ref 0.61–1.24)
Glucose, Bld: 158 mg/dL — ABNORMAL HIGH (ref 70–99)
HCT: 20 % — ABNORMAL LOW (ref 39.0–52.0)
Hemoglobin: 6.8 g/dL — CL (ref 13.0–17.0)
Potassium: 4.2 mmol/L (ref 3.5–5.1)
Sodium: 138 mmol/L (ref 135–145)
TCO2: 22 mmol/L (ref 22–32)

## 2021-03-07 LAB — GLUCOSE, CAPILLARY
Glucose-Capillary: 121 mg/dL — ABNORMAL HIGH (ref 70–99)
Glucose-Capillary: 119 mg/dL — ABNORMAL HIGH (ref 70–99)
Glucose-Capillary: 129 mg/dL — ABNORMAL HIGH (ref 70–99)
Glucose-Capillary: 181 mg/dL — ABNORMAL HIGH (ref 70–99)
Glucose-Capillary: 179 mg/dL — ABNORMAL HIGH (ref 70–99)

## 2021-03-07 LAB — BASIC METABOLIC PANEL
Anion gap: 6 (ref 5–15)
BUN: 36 mg/dL — ABNORMAL HIGH (ref 6–20)
CO2: 23 mmol/L (ref 22–32)
Calcium: 7.6 mg/dL — ABNORMAL LOW (ref 8.9–10.3)
Chloride: 105 mmol/L (ref 98–111)
Creatinine, Ser: 3.24 mg/dL — ABNORMAL HIGH (ref 0.61–1.24)
GFR, Estimated: 23 mL/min — ABNORMAL LOW (ref >60)
Glucose, Bld: 144 mg/dL — ABNORMAL HIGH (ref 70–99)
Potassium: 4.3 mmol/L (ref 3.5–5.1)
Sodium: 134 mmol/L — ABNORMAL LOW (ref 135–145)

## 2021-03-07 LAB — PREPARE RBC (CROSSMATCH)

## 2021-03-07 LAB — CBC
HCT: 24.6 % — ABNORMAL LOW (ref 39.0–52.0)
HCT: 23.3 % — ABNORMAL LOW (ref 39.0–52.0)
Hemoglobin: 7.9 g/dL — ABNORMAL LOW (ref 13.0–17.0)
Hemoglobin: 7.8 g/dL — ABNORMAL LOW (ref 13.0–17.0)
MCH: 28.1 pg (ref 26.0–34.0)
MCH: 29.5 pg (ref 26.0–34.0)
MCHC: 32.1 g/dL (ref 30.0–36.0)
MCHC: 33.5 g/dL (ref 30.0–36.0)
MCV: 87.5 fL (ref 80.0–100.0)
MCV: 88.3 fL (ref 80.0–100.0)
Platelets: 310 10*3/uL (ref 150–400)
Platelets: 295 10*3/uL (ref 150–400)
RBC: 2.81 MIL/uL — ABNORMAL LOW (ref 4.22–5.81)
RBC: 2.64 MIL/uL — ABNORMAL LOW (ref 4.22–5.81)
RDW: 13.3 % (ref 11.5–15.5)
RDW: 13.5 % (ref 11.5–15.5)
WBC: 9.7 10*3/uL (ref 4.0–10.5)
WBC: 17.3 10*3/uL — ABNORMAL HIGH (ref 4.0–10.5)
nRBC: 0 % (ref 0.0–0.2)
nRBC: 0 % (ref 0.0–0.2)

## 2021-03-07 SURGERY — IRRIGATION AND DEBRIDEMENT EXTREMITY
Anesthesia: General | Laterality: Left

## 2021-03-07 MED ORDER — 0.9 % SODIUM CHLORIDE (POUR BTL) OPTIME
TOPICAL | Status: DC | PRN
  Administered 2021-03-07: 1000 mL

## 2021-03-07 MED ORDER — PROPOFOL 10 MG/ML IV BOLUS
INTRAVENOUS | Status: DC | PRN
  Administered 2021-03-07: 200 mg via INTRAVENOUS
  Administered 2021-03-07: 50 mg via INTRAVENOUS

## 2021-03-07 MED ORDER — FENTANYL CITRATE (PF) 250 MCG/5ML IJ SOLN
INTRAMUSCULAR | Status: AC
  Filled 2021-03-07: qty 5

## 2021-03-07 MED ORDER — OXYCODONE HCL 5 MG PO TABS: 5.0000 mg | ORAL_TABLET | Freq: Once | ORAL | Status: DC | PRN

## 2021-03-07 MED ORDER — ORAL CARE MOUTH RINSE: 15.0000 mL | Freq: Once | OROMUCOSAL | Status: AC

## 2021-03-07 MED ORDER — PROPOFOL 10 MG/ML IV BOLUS
INTRAVENOUS | Status: AC
  Filled 2021-03-07: qty 20

## 2021-03-07 MED ORDER — ONDANSETRON HCL 4 MG/2ML IJ SOLN
INTRAMUSCULAR | Status: DC | PRN
  Administered 2021-03-07: 4 mg via INTRAVENOUS

## 2021-03-07 MED ORDER — HYDROMORPHONE HCL 1 MG/ML IJ SOLN
0.2500 mg | INTRAMUSCULAR | Status: DC | PRN
  Administered 2021-03-07 (×2): 0.5 mg via INTRAVENOUS

## 2021-03-07 MED ORDER — CLONIDINE HCL (ANALGESIA) 100 MCG/ML EP SOLN
EPIDURAL | Status: DC | PRN
  Administered 2021-03-07: 33 ug
  Administered 2021-03-07: 67 ug

## 2021-03-07 MED ORDER — BUPIVACAINE-EPINEPHRINE (PF) 0.5% -1:200000 IJ SOLN
INTRAMUSCULAR | Status: DC | PRN
  Administered 2021-03-07: 20 mL via PERINEURAL
  Administered 2021-03-07: 10 mL via PERINEURAL

## 2021-03-07 MED ORDER — MIDAZOLAM HCL 2 MG/2ML IJ SOLN
INTRAMUSCULAR | Status: AC
  Filled 2021-03-07: qty 2

## 2021-03-07 MED ORDER — SODIUM CHLORIDE 0.9% IV SOLUTION: Freq: Once | INTRAVENOUS | Status: DC

## 2021-03-07 MED ORDER — CEFAZOLIN SODIUM-DEXTROSE 2-4 GM/100ML-% IV SOLN
INTRAVENOUS | Status: AC
  Filled 2021-03-07: qty 100

## 2021-03-07 MED ORDER — GLYCOPYRROLATE PF 0.2 MG/ML IJ SOSY
PREFILLED_SYRINGE | INTRAMUSCULAR | Status: AC
  Filled 2021-03-07: qty 1

## 2021-03-07 MED ORDER — SODIUM CHLORIDE 0.9 % IV SOLN: INTRAVENOUS | Status: DC

## 2021-03-07 MED ORDER — DEXAMETHASONE SODIUM PHOSPHATE 10 MG/ML IJ SOLN
INTRAMUSCULAR | Status: AC
  Filled 2021-03-07: qty 1

## 2021-03-07 MED ORDER — ONDANSETRON HCL 4 MG/2ML IJ SOLN
INTRAMUSCULAR | Status: AC
  Filled 2021-03-07: qty 2

## 2021-03-07 MED ORDER — LIDOCAINE 2% (20 MG/ML) 5 ML SYRINGE
INTRAMUSCULAR | Status: AC
  Filled 2021-03-07: qty 5

## 2021-03-07 MED ORDER — HYDROMORPHONE HCL 1 MG/ML IJ SOLN
INTRAMUSCULAR | Status: AC
  Filled 2021-03-07: qty 2

## 2021-03-07 MED ORDER — SODIUM CHLORIDE 0.9 % IV SOLN: INTRAVENOUS | Status: DC | PRN

## 2021-03-07 MED ORDER — TRANEXAMIC ACID-NACL 1000-0.7 MG/100ML-% IV SOLN
1000.0000 mg | Freq: Once | INTRAVENOUS | Status: AC
  Administered 2021-03-07: 1000 mg via INTRAVENOUS
  Filled 2021-03-07: qty 100

## 2021-03-07 MED ORDER — FENTANYL CITRATE (PF) 250 MCG/5ML IJ SOLN
INTRAMUSCULAR | Status: DC | PRN
  Administered 2021-03-07 (×5): 50 ug via INTRAVENOUS

## 2021-03-07 MED ORDER — LACTATED RINGERS IV SOLN: INTRAVENOUS | Status: DC

## 2021-03-07 MED ORDER — MIDAZOLAM HCL 2 MG/2ML IJ SOLN
INTRAMUSCULAR | Status: DC | PRN
  Administered 2021-03-07: 2 mg via INTRAVENOUS

## 2021-03-07 MED ORDER — FENTANYL CITRATE (PF) 100 MCG/2ML IJ SOLN: 25.0000 ug | INTRAMUSCULAR | Status: DC | PRN

## 2021-03-07 MED ORDER — ACETAMINOPHEN 10 MG/ML IV SOLN
1000.0000 mg | Freq: Once | INTRAVENOUS | Status: AC
  Administered 2021-03-07: 1000 mg via INTRAVENOUS

## 2021-03-07 MED ORDER — LIDOCAINE HCL (CARDIAC) PF 100 MG/5ML IV SOSY
PREFILLED_SYRINGE | INTRAVENOUS | Status: DC | PRN
  Administered 2021-03-07: 100 mg via INTRAVENOUS

## 2021-03-07 MED ORDER — CHLORHEXIDINE GLUCONATE 0.12 % MT SOLN
15.0000 mL | Freq: Once | OROMUCOSAL | Status: AC
  Administered 2021-03-07: 15 mL via OROMUCOSAL
  Filled 2021-03-07: qty 15

## 2021-03-07 MED ORDER — OXYCODONE HCL 5 MG/5ML PO SOLN
5.0000 mg | Freq: Once | ORAL | Status: DC | PRN
Start: 2021-03-07 — End: 2021-03-07

## 2021-03-07 MED ORDER — ACETAMINOPHEN 10 MG/ML IV SOLN
INTRAVENOUS | Status: AC
  Filled 2021-03-07: qty 100

## 2021-03-07 MED ORDER — GLYCOPYRROLATE 0.2 MG/ML IJ SOLN
INTRAMUSCULAR | Status: DC | PRN
  Administered 2021-03-07: .2 mg via INTRAVENOUS

## 2021-03-07 SURGICAL SUPPLY — 39 items
BAG COUNTER SPONGE SURGICOUNT (BAG) ×1 IMPLANT
BLADE SURG 21 STRL SS (BLADE) ×3 IMPLANT
BNDG COHESIVE 6X5 TAN NS LF (GAUZE/BANDAGES/DRESSINGS) ×1 IMPLANT
BNDG COHESIVE 6X5 TAN STRL LF (GAUZE/BANDAGES/DRESSINGS) IMPLANT
BNDG GAUZE ELAST 4 BULKY (GAUZE/BANDAGES/DRESSINGS) ×5 IMPLANT
CANISTER WOUNDNEG PRESSURE 500 (CANNISTER) ×1 IMPLANT
COVER SURGICAL LIGHT HANDLE (MISCELLANEOUS) ×6 IMPLANT
DRAPE DERMATAC (DRAPES) ×2 IMPLANT
DRAPE U-SHAPE 47X51 STRL (DRAPES) ×3 IMPLANT
DRESSING PREVENA PLUS CUSTOM (GAUZE/BANDAGES/DRESSINGS) IMPLANT
DRESSING VERAFLO CLEANS CC MED (GAUZE/BANDAGES/DRESSINGS) IMPLANT
DRSG ADAPTIC 3X8 NADH LF (GAUZE/BANDAGES/DRESSINGS) ×2 IMPLANT
DRSG PREVENA PLUS CUSTOM (GAUZE/BANDAGES/DRESSINGS) ×3
DRSG VERAFLO CLEANSE CC MED (GAUZE/BANDAGES/DRESSINGS) ×3
DURAPREP 26ML APPLICATOR (WOUND CARE) ×3 IMPLANT
ELECT REM PT RETURN 9FT ADLT (ELECTROSURGICAL) ×3
ELECTRODE REM PT RTRN 9FT ADLT (ELECTROSURGICAL) IMPLANT
GAUZE SPONGE 4X4 12PLY STRL (GAUZE/BANDAGES/DRESSINGS) ×2 IMPLANT
GLOVE SURG ORTHO LTX SZ9 (GLOVE) ×3 IMPLANT
GLOVE SURG UNDER POLY LF SZ9 (GLOVE) ×3 IMPLANT
GOWN STRL REUS W/ TWL XL LVL3 (GOWN DISPOSABLE) ×4 IMPLANT
GOWN STRL REUS W/TWL XL LVL3 (GOWN DISPOSABLE) ×2
HANDPIECE INTERPULSE COAX TIP (DISPOSABLE)
KIT BASIN OR (CUSTOM PROCEDURE TRAY) ×3 IMPLANT
KIT TURNOVER KIT B (KITS) ×3 IMPLANT
MANIFOLD NEPTUNE II (INSTRUMENTS) ×3 IMPLANT
MICROMATRIX 1000MG (Tissue) ×3 IMPLANT
NS IRRIG 1000ML POUR BTL (IV SOLUTION) ×3 IMPLANT
PACK ORTHO EXTREMITY (CUSTOM PROCEDURE TRAY) ×3 IMPLANT
PAD ARMBOARD 7.5X6 YLW CONV (MISCELLANEOUS) ×6 IMPLANT
SET HNDPC FAN SPRY TIP SCT (DISPOSABLE) IMPLANT
SOLUTION PARTIC MCRMTRX 1000MG (Tissue) IMPLANT
STOCKINETTE IMPERVIOUS 9X36 MD (GAUZE/BANDAGES/DRESSINGS) IMPLANT
SUT ETHILON 2 0 PSLX (SUTURE) ×6 IMPLANT
SWAB COLLECTION DEVICE MRSA (MISCELLANEOUS) ×2 IMPLANT
SWAB CULTURE ESWAB REG 1ML (MISCELLANEOUS) IMPLANT
TOWEL GREEN STERILE (TOWEL DISPOSABLE) ×3 IMPLANT
TUBE CONNECTING 12X1/4 (SUCTIONS) ×3 IMPLANT
YANKAUER SUCT BULB TIP NO VENT (SUCTIONS) ×3 IMPLANT

## 2021-03-07 NOTE — Interval H&P Note (Signed)
History and Physical Interval Note:  03/07/2021 7:07 AM  Adam James  has presented today for surgery, with the diagnosis of Necrotizing Fascitis Left Leg.  The various methods of treatment have been discussed with the patient and family. After consideration of risks, benefits and other options for treatment, the patient has consented to  Procedure(s): REPEAT DEBRIDEMENT LEFT LEG (Left) as a surgical intervention.  The patient's history has been reviewed, patient examined, no change in status, stable for surgery.  I have reviewed the patient's chart and labs.  Questions were answered to the patient's satisfaction.     [name]

## 2021-03-07 NOTE — Progress Notes (Addendum)
HD#12 SUBJECTIVE:  Patient Summary: Adam James is a 50 y.o. with a pertinent PMH of diabetes, ischemic heart disease, chronic heart failure with reduced EF, and CKD IIIa who presented s/p irrigation and debridement for left foot diabetic infection with gas gangrene and necrotizing fasciitis  Overnight Events: No acute events overnight  Interim History: This is hospital day 12 for Adam James who was seen and evaluated at the bedside this morning. The patient states that his pain is well controlled again today. Patient is going to the OR today with ortho for a repeat washout. Discussed that the patient may require another blood transfusion after the surgery.   OBJECTIVE:  Vital Signs: Vitals:   03/06/21 0611 03/06/21 0913 03/06/21 1137 03/06/21 2132  BP: (!) 157/95 (!) 138/98 (!) 142/89 (!) 156/84  Pulse: 92  92 89  Resp: 18   19  Temp: 97.6 F (36.4 C)  97.6 F (36.4 C) 98.4 F (36.9 C)  TempSrc: Oral   Oral  SpO2: 98%  96% 97%  Weight:      Height:       Supplemental O2: Room Air SpO2: 97 % O2 Flow Rate (L/min): 0 L/min FiO2 (%): 21 %  Filed Weights   03/04/21 0548 03/05/21 0400 03/06/21 0409  Weight: 118.6 kg 119.9 kg 120 kg     Intake/Output Summary (Last 24 hours) at 03/07/2021 0558 Last data filed at 03/07/2021 0400 Gross per 24 hour  Intake 1241.06 ml  Output 600 ml  Net 641.06 ml   Net IO Since Admission: 5,910.68 mL [03/07/21 0558]  Physical Exam: General: Patient is not in any acute distress CV: RRR. No murmurs, rubs, or gallops. No LE edema. No JVD Pulmonary: Lungs CTAB. Normal effort. No wheezing or rales. Extremities: Palpable radial and DP pulses on the right. 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 2cm ulcer on plantar aspect of R foot under the base of the 4th-5th metatarsal, surrounding skin is necrotic, wrapped in gauze. Unable to assess LLE due to post-op dressing that extends up to patient's knee and wound vac remains in place with ~300cc  serosanguinous fluid Skin: Warm and dry. No obvious rash or lesions. Some sacral edema noted. Neuro: A&Ox3. Reduced sensation in lower extremities. No focal deficit. Psych: Depressed mood and affect give patient situation      ASSESSMENT/PLAN:  Assessment: Principal Problem:   Gangrene of left foot (HCC) Active Problems:   Cardiomyopathy (HCC)   Type 2 diabetes mellitus (HCC)   Diabetic foot ulcer (HCC)   CAD (coronary artery disease)   Necrotizing fasciitis (HCC)   Left leg cellulitis   Diabetic polyneuropathy associated with type 2 diabetes mellitus (HCC)   Severe protein-calorie malnutrition (HCC)   Plan: #Left sided diabetic foot infection with gas gangrene and necrotizing fasciitis Patient is going to the OR for a repeat limb salvage intervention today. He continues to deny any pain at this time and is currently thinking about repeat limb salvage interventions vs an above the knee amputation. White blood cell count remains stable and the patient continues to receive IV antibiotics as below. He will likely require a tunneled IJ rather than a PICC line for long term antibiotics if he continues with repeat limb salvage interventions. - ID consulted, appreciate recommendations  - Continue Daptomycin and Unasyn - Repeat debridement today - Monitor wound vac output - Prevalon boot placed to reduce risk of ankle contractures - Attempt to work with PT/OT as able  #Acute on chronic renal failure #Acute  tubular necrosis, likely Patient has CKD IIIa at his baseline and has had worsening of his kidney function since his surgeries. Cr improved today to 3.24, but we will continue to monitor this after his repeat surgery today. He has not been receiving IV fluids as he is mildly hypervolemic at this time and we do not want to volume overload him with his history of HFrEF.  - Continue to monitor renal function - Avoid nephrotoxic agents - Supportive treatment - Consider nephro consult if  Cr worsens  #Type 2 Diabetes with diabetic neuropathy HbA1c 8.8. Blood sugars been between 120s-140s in the last day. Blood sugar goal remains <180.  - Continue Insulin glargine to 15 units qhs, Novolog 8U TID with meals, and SSI - Continue CBG monitoring   #Chronic HFrEF #Ischemic heart disease Patient denies any chest pain, palpitations, SOB, or lower extremity edema at this time. His weight is up about ~10 kg since admission. His Hb is 8 this morning, and we will get a repeat Hb after his surgery. With his history of ischemic heart disease, we will transfuse him if his Hb drops below 8. - Continue to monitor CBC, repeat CBC at 8pm - Transfuse if Hb <8  Best Practice: Diet: Carb modified diet after surgery IVF: none VTE: heparin TID after surgery Code: Full AB: Daptomycin/Unasyn Therapy Recs: PT/OT recommending CIR DISPO: Anticipated discharge pending  clinical improvement .   Signature: Elza Rafter, D.O.  Internal Medicine Resident, PGY-1 [facility]  Pager: [phone] 5:58 AM, 03/07/2021   Please contact the on call pager after 5 pm and on weekends at [phone].

## 2021-03-07 NOTE — Progress Notes (Signed)
ID Brief Note   Component 11 d ago   Specimen Description ABSCESS LEFT FOOT   Special Requests VANCOMYCIN,ZOSYN   Gram Stain RARE WBC PRESENT,BOTH PMN AND MONONUCLEAR  FEW GRAM POSITIVE COCCI IN PAIRS   Culture ABUNDANT STREPTOCOCCUS AGALACTIAE  TESTING AGAINST S. AGALACTIAE NOT ROUTINELY PERFORMED DUE TO PREDICTABILITY OF AMP/PEN/VAN SUSCEPTIBILITY.  RARE METHICILLIN RESISTANT STAPHYLOCOCCUS AUREUS  WITHIN MIXED ORGANISMS  NO ANAEROBES ISOLATED  RARE EIKENELLA CORRODENS  Standardized susceptibility testing for this organism is not available.  Performed at [facility], [address], [place], [place] [address]   Report Status 03/06/2021 FINAL   Organism ID, Bacteria METHICILLIN RESISTANT STAPHYLOCOCCUS AUREUS   Resulting Agency [facility]     Susceptibility   Methicillin resistant staphylococcus aureus    MIC    CIPROFLOXACIN >=8 RESISTANT  Resistant    CLINDAMYCIN <=0.25 SENS... Sensitive    ERYTHROMYCIN >=8 RESISTANT  Resistant    GENTAMICIN <=0.5 SENSI... Sensitive    Inducible Clindamycin NEGATIVE  Sensitive    OXACILLIN >=4 RESISTANT  Resistant    RIFAMPIN <=0.5 SENSI... Sensitive    TETRACYCLINE <=1 SENSITIVE  Sensitive    TRIMETH/SULFA 160 RESISTANT  Resistant    VANCOMYCIN 1 SENSITIVE  Sensitive                  Recommendations  Continue current antimicrobials Follow OR notes /findings from today for planning abtx duration PICC line with 6 weeks of IV abtx if planned for limb salvage Discussed with ID pharmacy and Primary   Dr Earlene Plater available as needed with questions. Otherwise, new ID team will follow on Monday.   Odette Fraction, MD Infectious Disease Physician [facility] [address] [place], [place] [address] Phone: [phone]  Fax: [phone]

## 2021-03-07 NOTE — Anesthesia Procedure Notes (Signed)
Procedure Name: LMA Insertion Date/Time: 03/07/2021 2:15 PM Performed by: Melina Schools, CRNA Pre-anesthesia Checklist: Patient identified, Emergency Drugs available, Suction available, Patient being monitored and Timeout performed Patient Re-evaluated:Patient Re-evaluated prior to induction Oxygen Delivery Method: Circle system utilized Preoxygenation: Pre-oxygenation with 100% oxygen Induction Type: IV induction Ventilation: Mask ventilation without difficulty LMA: LMA inserted LMA Size: 5.0 Placement Confirmation: positive ETCO2 and breath sounds checked- equal and bilateral Dental Injury: Teeth and Oropharynx as per pre-operative assessment

## 2021-03-07 NOTE — Op Note (Signed)
03/07/2021  6:13 PM  PATIENT:  Adam James    PRE-OPERATIVE DIAGNOSIS:  Necrotizing Fascitis Left Leg  POST-OPERATIVE DIAGNOSIS:  Same  PROCEDURE:  REPEAT DEBRIDEMENT LEFT LEG, APPLICATION OF WOUND VAC Local tissue rearrangement for wound closure 55 x 13 cm.  SURGEON:  [name], MD  PHYSICIAN ASSISTANT:None ANESTHESIA:   General  PREOPERATIVE INDICATIONS:  Adam James is a  50 y.o. male with a diagnosis of Necrotizing Fascitis Left Leg who failed conservative measures and elected for surgical management.    The risks benefits and alternatives were discussed with the patient preoperatively including but not limited to the risks of infection, bleeding, nerve injury, cardiopulmonary complications, the need for revision surgery, among others, and the patient was willing to proceed.  OPERATIVE IMPLANTS: Praveena customizable wound VAC.  @ENCIMAGES @  OPERATIVE FINDINGS: Patient had extensive loss of all of the muscles of the anterior and lateral compartment involving some of the posterior compartment.  The tissue margins were clear and the wound was closed.  OPERATIVE PROCEDURE: Patient was brought the operating room and underwent a general anesthetic.  After adequate levels anesthesia were obtained patient's left lower extremity was prepped using Betadine paint and draped into a sterile field a timeout was called.  Retention sutures and wound VAC was removed.  Patient had further necrosis of the remaining muscle of the anterior lateral compartment these muscles were resected sharply.  Patient also had further necrosis of the skin over the anterior tibia this was also resected.  The anterior tibial vessels were intact.  There was further resection of the muscles of the medial and lateral gastrocnemius muscles.  The wound was irrigated with normal saline electrocardio was used for hemostasis.  Local tissue rearrangement was used to close the wound 55 x 13 cm.  The customizable  Prevena wound VAC was applied this had a good suction fit overwrapped with Coban.  Patient was extubated taken the PACU in stable condition.  Debridement type: Excisional Debridement  Side: left  Body Location: leg   Tools used for debridement: scalpel, scissors, and rongeur  Pre-debridement Wound size (cm):   Length: 50        Width: 10     Depth: 3   Post-debridement Wound size (cm):   Length: 55        Width: 13     Depth: 3   Debridement depth beyond dead/damaged tissue down to healthy viable tissue: yes  Tissue layer involved: skin, subcutaneous tissue, muscle / fascia  Nature of tissue removed: Slough, Necrotic, Devitalized Tissue, and Non-viable tissue  Irrigation volume: 3 liters     Irrigation fluid type: Normal Saline     DISCHARGE PLANNING:  Antibiotic duration: Continue antibiotics as per infectious disease  Weightbearing: Weightbearing as tolerated  Pain medication: Opioid pathway  Dressing care/ Wound VAC: Continue wound VAC for 1 week  Ambulatory devices: Walker  Discharge to: Anticipate discharge to home.  Follow-up: In the office 1 week post operative.

## 2021-03-07 NOTE — Anesthesia Procedure Notes (Signed)
Anesthesia Regional Block: Adductor canal block   Pre-Anesthetic Checklist: , timeout performed,  Correct Patient, Correct Site, Correct Laterality,  Correct Procedure, Correct Position, site marked,  Risks and benefits discussed,  Pre-op evaluation,  At surgeon's request and post-op pain management  Laterality: Left  Prep: Maximum Sterile Barrier Precautions used, chloraprep       Needles:  Injection technique: Single-shot  Needle Type: Echogenic Stimulator Needle     Needle Length: 9cm  Needle Gauge: 22     Additional Needles:   Procedures:,,,, ultrasound used (permanent image in chart),,    Narrative:  Start time: 03/07/2021 4:00 PM End time: 03/07/2021 4:03 PM Injection made incrementally with aspirations every 5 mL.  Performed by: Personally  Anesthesiologist: Kaylyn Layer, MD  Additional Notes: Risks, benefits, and alternative discussed. Patient gave consent for procedure. Patient prepped and draped in sterile fashion. Sedation administered, patient remains easily responsive to voice. Relevant anatomy identified with ultrasound guidance. Local anesthetic given in 5cc increments with no signs or symptoms of intravascular injection. No pain or paraesthesias with injection. Patient monitored throughout procedure with signs of LAST or immediate complications. Tolerated well. Ultrasound image placed in chart.  Amalia Greenhouse, MD

## 2021-03-07 NOTE — Anesthesia Procedure Notes (Signed)
Anesthesia Regional Block: Popliteal block   Pre-Anesthetic Checklist: , timeout performed,  Correct Patient, Correct Site, Correct Laterality,  Correct Procedure, Correct Position, site marked,  Risks and benefits discussed,  Pre-op evaluation,  At surgeon's request and post-op pain management  Laterality: Left  Prep: Maximum Sterile Barrier Precautions used, chloraprep       Needles:  Injection technique: Single-shot  Needle Type: Echogenic Stimulator Needle     Needle Length: 9cm  Needle Gauge: 22     Additional Needles:   Procedures:,,,, ultrasound used (permanent image in chart),,    Narrative:  Start time: 03/07/2021 4:03 PM End time: 03/07/2021 4:05 PM Injection made incrementally with aspirations every 5 mL.  Performed by: Personally  Anesthesiologist: Kaylyn Layer, MD  Additional Notes: Risks, benefits, and alternative discussed. Patient gave consent for procedure. Patient prepped and draped in sterile fashion. Sedation administered, patient remains easily responsive to voice. Relevant anatomy identified with ultrasound guidance. Local anesthetic given in 5cc increments with no signs or symptoms of intravascular injection. No pain or paraesthesias with injection. Patient monitored throughout procedure with signs of LAST or immediate complications. Tolerated well. Ultrasound image placed in chart.  Amalia Greenhouse, MD

## 2021-03-07 NOTE — Anesthesia Preprocedure Evaluation (Addendum)
Anesthesia Evaluation  Patient identified by MRN, date of birth, ID band Patient awake    Reviewed: Allergy & Precautions, NPO status , Patient's Chart, lab work & pertinent test results, reviewed documented beta blocker date and time   History of Anesthesia Complications Negative for: history of anesthetic complications  Airway Mallampati: II  TM Distance: >3 FB Neck ROM: Full    Dental  (+) Loose,    Pulmonary Current Smoker,    Pulmonary exam normal        Cardiovascular hypertension, Pt. on medications and Pt. on home beta blockers + CAD and + Past MI  Normal cardiovascular exam  TTE 02/20/21: EF 30-35%, anterior, anteroseptal and inferoseptal hypokinesis, mild LVH, grade I DD, borderline dilatation of the aortic root and of the ascending aorta measuring 36 mm.    Neuro/Psych negative neurological ROS  negative psych ROS   GI/Hepatic negative GI ROS, Neg liver ROS,   Endo/Other  diabetes, Type 2, Oral Hypoglycemic Agents  Renal/GU negative Renal ROS  negative genitourinary   Musculoskeletal negative musculoskeletal ROS (+)   Abdominal   Peds  Hematology Hgb 7.9   Anesthesia Other Findings Necrotizing Fascitis Left Leg  Reproductive/Obstetrics negative OB ROS                            Anesthesia Physical Anesthesia Plan  ASA: 3  Anesthesia Plan: General   Post-op Pain Management:    Induction: Intravenous  PONV Risk Score and Plan: 2 and Treatment may vary due to age or medical condition, Ondansetron and Midazolam  Airway Management Planned: LMA  Additional Equipment: None  Intra-op Plan:   Post-operative Plan: Extubation in OR  Informed Consent: I have reviewed the patients History and Physical, chart, labs and discussed the procedure including the risks, benefits and alternatives for the proposed anesthesia with the patient or authorized representative who has indicated  his/her understanding and acceptance.     Dental advisory given  Plan Discussed with: CRNA  Anesthesia Plan Comments:        Anesthesia Quick Evaluation

## 2021-03-07 NOTE — Transfer of Care (Signed)
Immediate Anesthesia Transfer of Care Note  Patient: Adam James  Procedure(s) Performed: REPEAT DEBRIDEMENT LEFT LEG (Left) APPLICATION OF WOUND VAC  Patient Location: PACU  Anesthesia Type:General  Level of Consciousness: awake, alert  and oriented  Airway & Oxygen Therapy: Patient Spontanous Breathing and Patient connected to face mask oxygen  Post-op Assessment: Report given to RN and Post -op Vital signs reviewed and stable  Post vital signs: Reviewed and stable  Last Vitals:  Vitals Value Taken Time  BP 121/74   Temp    Pulse 102 03/07/21 1509  Resp 14 03/07/21 1509  SpO2 96 % 03/07/21 1509  Vitals shown include unvalidated device data.  Last Pain:  Vitals:   03/07/21 1235  TempSrc:   PainSc: 1       Patients Stated Pain Goal: 3 (03/07/21 0134)  Complications: No notable events documented.

## 2021-03-07 NOTE — Plan of Care (Signed)

## 2021-03-08 ENCOUNTER — Inpatient Hospital Stay: Payer: Self-pay

## 2021-03-08 LAB — BASIC METABOLIC PANEL
Anion gap: 6 (ref 5–15)
BUN: 35 mg/dL — ABNORMAL HIGH (ref 6–20)
CO2: 23 mmol/L (ref 22–32)
Calcium: 7.2 mg/dL — ABNORMAL LOW (ref 8.9–10.3)
Chloride: 105 mmol/L (ref 98–111)
Creatinine, Ser: 2.89 mg/dL — ABNORMAL HIGH (ref 0.61–1.24)
GFR, Estimated: 26 mL/min — ABNORMAL LOW (ref >60)
Glucose, Bld: 155 mg/dL — ABNORMAL HIGH (ref 70–99)
Potassium: 4.6 mmol/L (ref 3.5–5.1)
Sodium: 134 mmol/L — ABNORMAL LOW (ref 135–145)

## 2021-03-08 LAB — GLUCOSE, CAPILLARY
Glucose-Capillary: 169 mg/dL — ABNORMAL HIGH (ref 70–99)
Glucose-Capillary: 173 mg/dL — ABNORMAL HIGH (ref 70–99)
Glucose-Capillary: 141 mg/dL — ABNORMAL HIGH (ref 70–99)
Glucose-Capillary: 168 mg/dL — ABNORMAL HIGH (ref 70–99)

## 2021-03-08 LAB — BPAM RBC
Blood Product Expiration Date: 202208192359
Blood Product Expiration Date: 202208252359
Blood Product Expiration Date: 202208252359
ISSUE DATE / TIME: 202208121613
ISSUE DATE / TIME: 202208121650
ISSUE DATE / TIME: 202208122126
Unit Type and Rh: 600
Unit Type and Rh: 600
Unit Type and Rh: 600

## 2021-03-08 LAB — CBC
HCT: 26.2 % — ABNORMAL LOW (ref 39.0–52.0)
Hemoglobin: 8.8 g/dL — ABNORMAL LOW (ref 13.0–17.0)
MCH: 29.7 pg (ref 26.0–34.0)
MCHC: 33.6 g/dL (ref 30.0–36.0)
MCV: 88.5 fL (ref 80.0–100.0)
Platelets: 292 10*3/uL (ref 150–400)
RBC: 2.96 MIL/uL — ABNORMAL LOW (ref 4.22–5.81)
RDW: 13.7 % (ref 11.5–15.5)
WBC: 14.8 10*3/uL — ABNORMAL HIGH (ref 4.0–10.5)
nRBC: 0 % (ref 0.0–0.2)

## 2021-03-08 LAB — TYPE AND SCREEN
ABO/RH(D): A NEG
Antibody Screen: NEGATIVE
Unit division: 0
Unit division: 0
Unit division: 0

## 2021-03-08 MED ORDER — ONDANSETRON HCL 4 MG/2ML IJ SOLN
4.0000 mg | Freq: Four times a day (QID) | INTRAMUSCULAR | Status: DC | PRN
  Administered 2021-03-08 (×2): 4 mg via INTRAVENOUS
  Filled 2021-03-08 (×3): qty 2

## 2021-03-08 NOTE — Progress Notes (Signed)
OT Cancellation Note  Patient Details Name: Adam James MRN: [id number] DOB: 05-May-1971   Cancelled Treatment:    Reason Eval/Treat Not Completed: Other (comment).  Noted pt. With nausea and vomiting this am.  Spoke with RN who states pt. Just recently fell asleep and is resting.  Advises holding skilled therapy for now.  Will check back as able.    Evangeline Gula Lorraine-COTA/L 03/08/2021, 10:22 AM

## 2021-03-08 NOTE — Progress Notes (Signed)
Pt c/o nausea and burning in his stomach. Began to vomit while this nurse was in room. MD on call paged for orders.

## 2021-03-08 NOTE — Progress Notes (Signed)
PT Cancellation Note  Patient Details Name: Adam James MRN: [id number] DOB: [birth year]   Cancelled Treatment:    Reason Eval/Treat Not Completed: Patient declined, no reason specified New order acknowledged. Pt resting and nauseous this morning unable to participate with OT. Attempted follow-up this afternoon. Pt declines physical therapy assessment due to feeling nauseous.  Will attempt again when next available.   Berton Mount 03/08/2021, 12:53 PM  Charlsie Merles, PT, DPT

## 2021-03-08 NOTE — Progress Notes (Signed)
HD#12 Subjective:  Patient Summary: [name] is a 50 year old with PMHx of diabetes, ischemic heart disease, HFrEF (EF 30-35%) and CKDIIIa admitted for left lower extremity necrotizing fasciitis s/p irrigation and debridement  on 8/3, 8/8 and 8/12 with wound vac in place complicated by acute on chronic renal failure.   Overnight Events: Patient reported to have an episode of nausea/vomiting for which he was given zofran.    Interim History: [name] is resting comfortably in bed this morning with left lower extremity in dressing with wound vac in place draining serous fluid. He denies any pain at this time. He feels slightly nauseous but reports that it is overall improved. Discussed recommendation for IV antibiotics on discharge for which he is agreeable. Advised to work with physical therapy.   Objective:  Vital signs in last 24 hours: Vitals:   03/08/21 0013 03/08/21 0100 03/08/21 0445 03/08/21 0451  BP: 117/86 (!) 106/54 134/81   Pulse: 90 90 96   Resp: 20 18 20    Temp:  98.5 F (36.9 C)    TempSrc:  Oral    SpO2: 98%  96%   Weight:    117 kg  Height:       Supplemental O2: Room Air SpO2: 96 % O2 Flow Rate (L/min): 0 L/min FiO2 (%): 21 %   Physical Exam:  Constitutional: well-appearing middle-aged male resting in bed , in no acute distress Cardiovascular: regular rate and rhythm, S1 and S2 present, no m/r/g Pulmonary/Chest: normal work of breathing on room air, lungs clear to auscultation bilaterally MSK: normal bulk and tone in RLE with palpable radial and dorsalis pedis pulses on R 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 No obvious rash or lesions; sacral edema present  Psych: depressed mood and affect   Filed Weights   03/05/21 0400 03/06/21 0409 03/08/21 0451  Weight: 119.9 kg 120 kg 117 kg     Intake/Output Summary (Last 24 hours) at 03/08/2021 0622 Last data filed  at 03/08/2021 0400 Gross per 24 hour  Intake 3145.96 ml  Output 1275 ml  Net 1870.96 ml   Net IO Since Admission: 7,781.64 mL [03/08/21 0622]  Pertinent Labs: CBC Latest Ref Rng & Units 03/08/2021 03/07/2021 03/07/2021  WBC 4.0 - 10.5 K/uL 14.8(H) 17.3(H) -  Hemoglobin 13.0 - 17.0 g/dL 05/07/2021) 7.8(L) 6.8(LL)  Hematocrit 39.0 - 52.0 % 26.2(L) 23.3(L) 20.0(L)  Platelets 150 - 400 K/uL 292 295 -    CMP Latest Ref Rng & Units 03/08/2021 03/07/2021 03/07/2021  Glucose 70 - 99 mg/dL 05/07/2021) 768(T) 157(W)  BUN 6 - 20 mg/dL 620(B) 55(H) 74(B)  Creatinine 0.61 - 1.24 mg/dL 63(A) 4.53(M) 4.68(E)  Sodium 135 - 145 mmol/L 134(L) 138 134(L)  Potassium 3.5 - 5.1 mmol/L 4.6 4.2 4.3  Chloride 98 - 111 mmol/L 105 108 105  CO2 22 - 32 mmol/L 23 - 23  Calcium 8.9 - 10.3 mg/dL 7.2(L) - 7.6(L)  Total Protein 6.5 - 8.1 g/dL - - -  Total Bilirubin 0.3 - 1.2 mg/dL - - -  Alkaline Phos 38 - 126 U/L - - -  AST 15 - 41 U/L - - -  ALT 0 - 44 U/L - - -    Imaging: No results found.  Assessment/Plan:   Principal Problem:   Gangrene of left foot (HCC) Active Problems:   Cardiomyopathy (HCC)   Type  2 diabetes mellitus (HCC)   Diabetic foot ulcer (HCC)   CAD (coronary artery disease)   Necrotizing fasciitis (HCC)   Left leg cellulitis   Diabetic polyneuropathy associated with type 2 diabetes mellitus (HCC)   Severe protein-calorie malnutrition [facility])   Patient Summary: [name] is a 50 year old with PMHx of diabetes, ischemic heart disease, HFrEF (EF 30-35%) and CKDIIIa admitted for left lower extremity necrotizing fasciitis s/p irrigation and debridement  with wound vac in place complicated by acute on chronic renal failure.    LLE necrotizing fascitis s/p multiple I&D's Patient admitted for left lower extremity necrotizing fasciitis s/p I&D on 8/3, 8/8 and 8/12 for attempted limb salvage. Patient has wound vac in place. He denies any pain at this time. Wound cultures with abundant strep  agalactiae and rare MRSA. He is on IV daptomycin and unasyn and is recommended for six week course of IV antibiotics by infectious disease. He remains afebrile and without leukocytosis. Encouraged to participate in PT/OT for discharge planning.  - ID consulted, appreciate recommendations - Orthopedic surgery consulted, appreciate recommendations - Monitor wound vac output  - PICC line IV daptomycin and unasyn for 6 weeks - Encourage to participate in PT/OT   AKI on CKDIIIa likely 2/2 ATN Initially noted to have worsening renal function in setting multiple I&D's. Urinalysis consistent with possible acute tubular necrosis. sCr improved to 2.89 today. He has not been receiving IV fluids due to hypervolemia in setting of HFrEF.  - Continue to monitor renal function - Avoid nephrotoxic agents - Continue supportive care and encourage PO intake  - If continues to improve, will adjust DVT prophylaxis back to lovenox   Acute on chronic normocytic anemia likely 2/2 acute blood loss Hx of ischemic heart disease and HFrEF  Noted to have Hb down to 6.8 following I&D yesterday for which he was transfused one unit of pRBC with correction to 8.8 this morning. Continues to have some serosanguinous fluid from wound vac. He denies any chest pain, palpitations or dyspnea at this time.  - Trend hemoglobin with goal Hb>8   Type II DM with diabetic neuropathy CBG's have been at goal (<180) on current regimen. - Continue Semglee 15U + Novolog 8U + SSI with meals   Diet: Carb-Modified IVF: None,None VTE: Heparin Code: Full PT/OT recs: Pending, .  Prior to Admission Living Arrangement: Home  Anticipated Discharge Location: Home w/HH vs SNF  Barriers to Discharge: Continued medical management, pending PT/OT recs  Dispo: Anticipated discharge to Skilled nursing facility pending clinical improvement and PT/OT recs .   Eliezer Bottom, MD Internal Medicine Resident PGY-3 Pager# [phone]  Please contact the  on call pager after 5 pm and on weekends at [phone].

## 2021-03-08 NOTE — Plan of Care (Signed)

## 2021-03-08 NOTE — Progress Notes (Signed)
Pharmacy Antibiotic Note  Adam James is a 50 y.o. male admitted on 02/24/2021 with  wound infection -  LLE gas gangrene nec fasc + RLE foot ulcer.  Pharmacy has been consulted for Dapto, Unasyn dosing.  Patient is afebrile, wbc trending down as well as SCr (3.4>2.89). MRI indicated likely infectious myositis/fasciitis. No osteo. Underwent I&D 8/8 and again 8/12.   Antibiotics this admission 8/1 zosyn>>8/2 8/1 vanc>>8/6 8/2 cefepime>>8/4 8/4 Unasyn> 8/7 Dapto>  8/6 CK: 50 8/10 CK: 34  8/1 wound>>MRSA and GBS - abundant step agalactiae, rare staph aureus  8/1 blood>>2/4 bottles staph epi/hominis 8/3 tissue>> rare strep agalactiae, RARE FINEGOLDIA MAGNA  Plan: Daptomycin 8mg /kg daily (adjust for CrCl<30) Unasyn 3 gm IV Q 6 hr (adjust for CrCl<30) Hold statin while on Dapto -f/u restart Monitor renal function, CK Q Wed Follow up LOT after I&D   Height: 6\' 3"  (190.5 cm) Weight: 117 kg (257 lb 15 oz) IBW/kg (Calculated) : 84.5  Temp (24hrs), Avg:98 F (36.7 C), Min:97.1 F (36.2 C), Max:98.5 F (36.9 C)  Recent Labs  Lab 03/05/21 0427 03/06/21 0457 03/07/21 0123 03/07/21 1624 03/07/21 2031 03/08/21 0305  WBC 9.6 10.2 9.7  --  17.3* 14.8*  CREATININE 3.42* 3.41* 3.24* 2.80*  --  2.89*     Estimated Creatinine Clearance: 43.1 mL/min (A) (by C-G formula based on SCr of 2.89 mg/dL (H)).    Allergies  Allergen Reactions   Morphine Itching    2032, PharmD PGY1 Pharmacy Resident 03/08/2021  12:19 PM  Please check [email or web address] for unit-specific pharmacy phone numbers.

## 2021-03-09 LAB — CBC
HCT: 26.3 % — ABNORMAL LOW (ref 39.0–52.0)
Hemoglobin: 9 g/dL — ABNORMAL LOW (ref 13.0–17.0)
MCH: 30.1 pg (ref 26.0–34.0)
MCHC: 34.2 g/dL (ref 30.0–36.0)
MCV: 88 fL (ref 80.0–100.0)
Platelets: 321 10*3/uL (ref 150–400)
RBC: 2.99 MIL/uL — ABNORMAL LOW (ref 4.22–5.81)
RDW: 14.2 % (ref 11.5–15.5)
WBC: 14.9 10*3/uL — ABNORMAL HIGH (ref 4.0–10.5)
nRBC: 0 % (ref 0.0–0.2)

## 2021-03-09 LAB — BASIC METABOLIC PANEL
Anion gap: 10 (ref 5–15)
BUN: 32 mg/dL — ABNORMAL HIGH (ref 6–20)
CO2: 25 mmol/L (ref 22–32)
Calcium: 7.6 mg/dL — ABNORMAL LOW (ref 8.9–10.3)
Chloride: 101 mmol/L (ref 98–111)
Creatinine, Ser: 2.76 mg/dL — ABNORMAL HIGH (ref 0.61–1.24)
GFR, Estimated: 27 mL/min — ABNORMAL LOW (ref >60)
Glucose, Bld: 178 mg/dL — ABNORMAL HIGH (ref 70–99)
Potassium: 4 mmol/L (ref 3.5–5.1)
Sodium: 136 mmol/L (ref 135–145)

## 2021-03-09 LAB — GLUCOSE, CAPILLARY
Glucose-Capillary: 194 mg/dL — ABNORMAL HIGH (ref 70–99)
Glucose-Capillary: 189 mg/dL — ABNORMAL HIGH (ref 70–99)
Glucose-Capillary: 199 mg/dL — ABNORMAL HIGH (ref 70–99)
Glucose-Capillary: 217 mg/dL — ABNORMAL HIGH (ref 70–99)

## 2021-03-09 MED ORDER — SODIUM CHLORIDE 0.9 % IV SOLN
2.0000 g | INTRAVENOUS | Status: DC
  Administered 2021-03-09 – 2021-03-12 (×4): 2 g via INTRAVENOUS
  Filled 2021-03-09 (×4): qty 20

## 2021-03-09 MED ORDER — SODIUM CHLORIDE 0.9 % IV SOLN
12.5000 mg | Freq: Four times a day (QID) | INTRAVENOUS | Status: DC | PRN
  Administered 2021-03-09 – 2021-03-10 (×3): 12.5 mg via INTRAVENOUS
  Filled 2021-03-09 (×5): qty 0.5

## 2021-03-09 MED ORDER — METRONIDAZOLE 500 MG PO TABS
500.0000 mg | ORAL_TABLET | Freq: Two times a day (BID) | ORAL | Status: DC
  Administered 2021-03-09: 500 mg via ORAL
  Filled 2021-03-09 (×2): qty 1

## 2021-03-09 MED ORDER — METOPROLOL SUCCINATE ER 50 MG PO TB24
50.0000 mg | ORAL_TABLET | Freq: Every day | ORAL | Status: DC
  Administered 2021-03-11 – 2021-03-15 (×5): 50 mg via ORAL
  Filled 2021-03-09 (×6): qty 1

## 2021-03-09 MED ORDER — SCOPOLAMINE 1 MG/3DAYS TD PT72
1.0000 | MEDICATED_PATCH | TRANSDERMAL | Status: DC
  Administered 2021-03-09: 1.5 mg via TRANSDERMAL
  Filled 2021-03-09 (×2): qty 1

## 2021-03-09 NOTE — Progress Notes (Signed)
PT Cancellation Note  Patient Details Name: Adam James MRN: [id number] DOB: 03/26/1971   Cancelled Treatment:    Reason Eval/Treat Not Completed: Patient declined, no reason specified  Patient continues to refuse physical therapy despite explaining the need for dispo planning and risk of secondary complications that may result from immobility. Very irritable.  Reluctantly agreed to try transferring to a recliner with therapist assist after extensive encouragement. However, after therapist set-up room for re-evaluation and transfer training he decided he did not wish to work with physical therapy today.   Berton Mount 03/09/2021, 10:37 AM  Berton Mount, PT, DPT

## 2021-03-09 NOTE — Plan of Care (Signed)

## 2021-03-09 NOTE — Progress Notes (Signed)
Secure chat with RN re PICC placement.  No  d/c home plans today.  Will notify if able to place PICC today.

## 2021-03-09 NOTE — Progress Notes (Addendum)
HD#14 SUBJECTIVE:  Patient Summary: Adam James is a 50 y.o. with a pertinent PMH of diabetes, ischemic heart disease, chronic heart failure with reduced EF, and CKD IIIa who presented s/p irrigation and debridement for left foot diabetic infection with gas gangrene and necrotizing fasciitis  Overnight Events: No acute events overnight  Interim History: This is hospital day 58 for Adam James who was seen and evaluated at the bedside. His pain remains well controlled. The patient states that he has been nauseous since Friday evening and cannot tolerate po intake. He notes that the Zofran did not help him and he has not eaten in 2 days. Advised patient to work with PT again today.   OBJECTIVE:  Vital Signs: Vitals:   03/08/21 0451 03/08/21 2021 03/09/21 0059 03/09/21 0520  BP:  (!) 146/94 (!) 141/75 (!) 160/93  Pulse:  98 84 (!) 102  Resp:  20 18 18   Temp:  98.5 F (36.9 C) 98.8 F (37.1 C) 99.1 F (37.3 C)  TempSrc:      SpO2:  96% 95% 97%  Weight: 117 kg  110.9 kg   Height:       Supplemental O2: Room Air SpO2: 97 % O2 Flow Rate (L/min): 0 L/min FiO2 (%): 21 %  Filed Weights   03/06/21 0409 03/08/21 0451 03/09/21 0059  Weight: 120 kg 117 kg 110.9 kg     Intake/Output Summary (Last 24 hours) at 03/09/2021 0610 Last data filed at 03/09/2021 0400 Gross per 24 hour  Intake 1178.69 ml  Output 275 ml  Net 903.69 ml   Net IO Since Admission: 8,685.33 mL [03/09/21 0610]  Physical Exam: Constitutional: well-appearing middle-aged male resting in bed , in no acute distress Cardiovascular: regular rate and rhythm, S1 and S2 present, no m/r/g Pulmonary/Chest: normal work of breathing on room air, lungs clear to auscultation bilaterally MSK: normal bulk and tone in RLE with palpable radial and dorsalis pedis pulses on R 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 No obvious rash or lesions;  sacral edema present  Psych: depressed mood and affect      ASSESSMENT/PLAN:  Assessment: Principal Problem:   Gangrene of left foot (HCC) Active Problems:   Cardiomyopathy (HCC)   Type 2 diabetes mellitus (HCC)   Diabetic foot ulcer (HCC)   CAD (coronary artery disease)   Necrotizing fasciitis (HCC)   Left leg cellulitis   Diabetic polyneuropathy associated with type 2 diabetes mellitus (HCC)   Severe protein-calorie malnutrition (HCC)   Plan: #LLE necrotizing fascitis s/p multiple I&D's Patient is s/p I&D on 8/3, 8/8 and 8/12 for attempted limb salvage. Patient has wound vac in place and continues to deny any pain at this time. He remains on IV daptomycin and unasyn and is recommended for six week course of IV antibiotics by infectious disease. Patient has been nauseous for the last couple of days, to which he believes is secondary to his IV antibiotics. Zofran did not improve his nausea and he has not been able to tolerate po intake since Friday. Patient remains afebrile and without leukocytosis. Encouraged to participate in PT/OT for discharge planning.  - ID consulted, appreciate recommendations  - Spoke with ID, recommended unasyn be discontinued in the setting of the patient's ongoing nausea; switched to cipro 2g daily and flagyl 500mg  po BID - Orthopedic surgery consulted, appreciate recommendations - Monitor wound vac output  - PICC  line to be placed for long term IV antibiotics - Encourage to participate in PT/OT  - IV phenergan for nausea PRN - Scopolamine patch for nausea   #AKI on CKDIIIa likely 2/2 ATN Initially noted to have worsening renal function in setting multiple I&D's. Urinalysis significant for granular casts, secondary to possible acute tubular necrosis. sCr slightly improved to 2.76 today. He has not been receiving IV fluids due to hypervolemia in setting of HFrEF.  - Continue to monitor renal function - Avoid nephrotoxic agents - Supportive care and  encourage PO intake  - If continues to improve, will adjust DVT prophylaxis back to lovenox    #Acute on chronic normocytic anemia likely 2/2 acute blood loss #Hx of ischemic heart disease and HFrEF  Hb is stable this morning at 9. The patient continues to have serosanguinous fluid output from wound vac. He denies any chest pain, palpitations or dyspnea at this time. Hb goal remains >8 - Transfuse if Hb <8 - Increased metoprolol to 50mg  daily as patient remains tachycardic and hypertensive   #Type II DM with diabetic neuropathy CBG's remain at goal (<180) on current regimen - Continue Semglee 15U + Novolog 8U + SSI with meals    Diet: Carb-Modified IVF: None,None VTE: Heparin Code: Full PT/OT recs: Pending   Prior to Admission Living Arrangement: Home  Anticipated Discharge Location: Home w/HH vs SNF  Barriers to Discharge: Continued medical management, pending PT/OT recs  Dispo: Anticipated discharge to Skilled nursing facility pending clinical improvement and PT/OT recs .   Signature: , D.O.  Internal Medicine Resident, PGY-1 [facility]  Pager: [phone] 6:10 AM, 03/09/2021   Please contact the on call pager after 5 pm and on weekends at [phone].

## 2021-03-09 NOTE — Progress Notes (Signed)
Pt vomiting, phenergan given this morning not helping, MD notified

## 2021-03-10 ENCOUNTER — Inpatient Hospital Stay (HOSPITAL_COMMUNITY): Payer: Self-pay

## 2021-03-10 DIAGNOSIS — E08621 Diabetes mellitus due to underlying condition with foot ulcer: Secondary | ICD-10-CM

## 2021-03-10 DIAGNOSIS — L97401 Non-pressure chronic ulcer of unspecified heel and midfoot limited to breakdown of skin: Secondary | ICD-10-CM

## 2021-03-10 HISTORY — PX: IR US GUIDE VASC ACCESS RIGHT: IMG2390

## 2021-03-10 HISTORY — PX: IR FLUORO GUIDE CV LINE RIGHT: IMG2283

## 2021-03-10 LAB — GLUCOSE, CAPILLARY
Glucose-Capillary: 188 mg/dL — ABNORMAL HIGH (ref 70–99)
Glucose-Capillary: 187 mg/dL — ABNORMAL HIGH (ref 70–99)
Glucose-Capillary: 191 mg/dL — ABNORMAL HIGH (ref 70–99)
Glucose-Capillary: 178 mg/dL — ABNORMAL HIGH (ref 70–99)

## 2021-03-10 LAB — CBC
HCT: 25.2 % — ABNORMAL LOW (ref 39.0–52.0)
Hemoglobin: 8.6 g/dL — ABNORMAL LOW (ref 13.0–17.0)
MCH: 30.1 pg (ref 26.0–34.0)
MCHC: 34.1 g/dL (ref 30.0–36.0)
MCV: 88.1 fL (ref 80.0–100.0)
Platelets: 375 10*3/uL (ref 150–400)
RBC: 2.86 MIL/uL — ABNORMAL LOW (ref 4.22–5.81)
RDW: 14.3 % (ref 11.5–15.5)
WBC: 21.9 10*3/uL — ABNORMAL HIGH (ref 4.0–10.5)
nRBC: 0 % (ref 0.0–0.2)

## 2021-03-10 LAB — BASIC METABOLIC PANEL
Anion gap: 12 (ref 5–15)
BUN: 31 mg/dL — ABNORMAL HIGH (ref 6–20)
CO2: 28 mmol/L (ref 22–32)
Calcium: 7.6 mg/dL — ABNORMAL LOW (ref 8.9–10.3)
Chloride: 98 mmol/L (ref 98–111)
Creatinine, Ser: 2.67 mg/dL — ABNORMAL HIGH (ref 0.61–1.24)
GFR, Estimated: 29 mL/min — ABNORMAL LOW (ref >60)
Glucose, Bld: 188 mg/dL — ABNORMAL HIGH (ref 70–99)
Potassium: 3.5 mmol/L (ref 3.5–5.1)
Sodium: 138 mmol/L (ref 135–145)

## 2021-03-10 IMAGING — US IR FLUORO GUIDE CV LINE*R*
2 series · 3 of 3 positions shown · non-contrast
Comparison: none

INDICATION: 49-year-old male with a history of left foot necrotizing fasciitis.
He requires durable venous access for long-term IV antibiotic
therapy. Given poor renal function, arm vein should be preserved for
potential future dialysis access. Therefore, he presents for
placement of a tunneled central line.

[Series 1: ir (id) (id)/(id)/(id) ir · 2 of 2 slices shown]
[im 1/2]
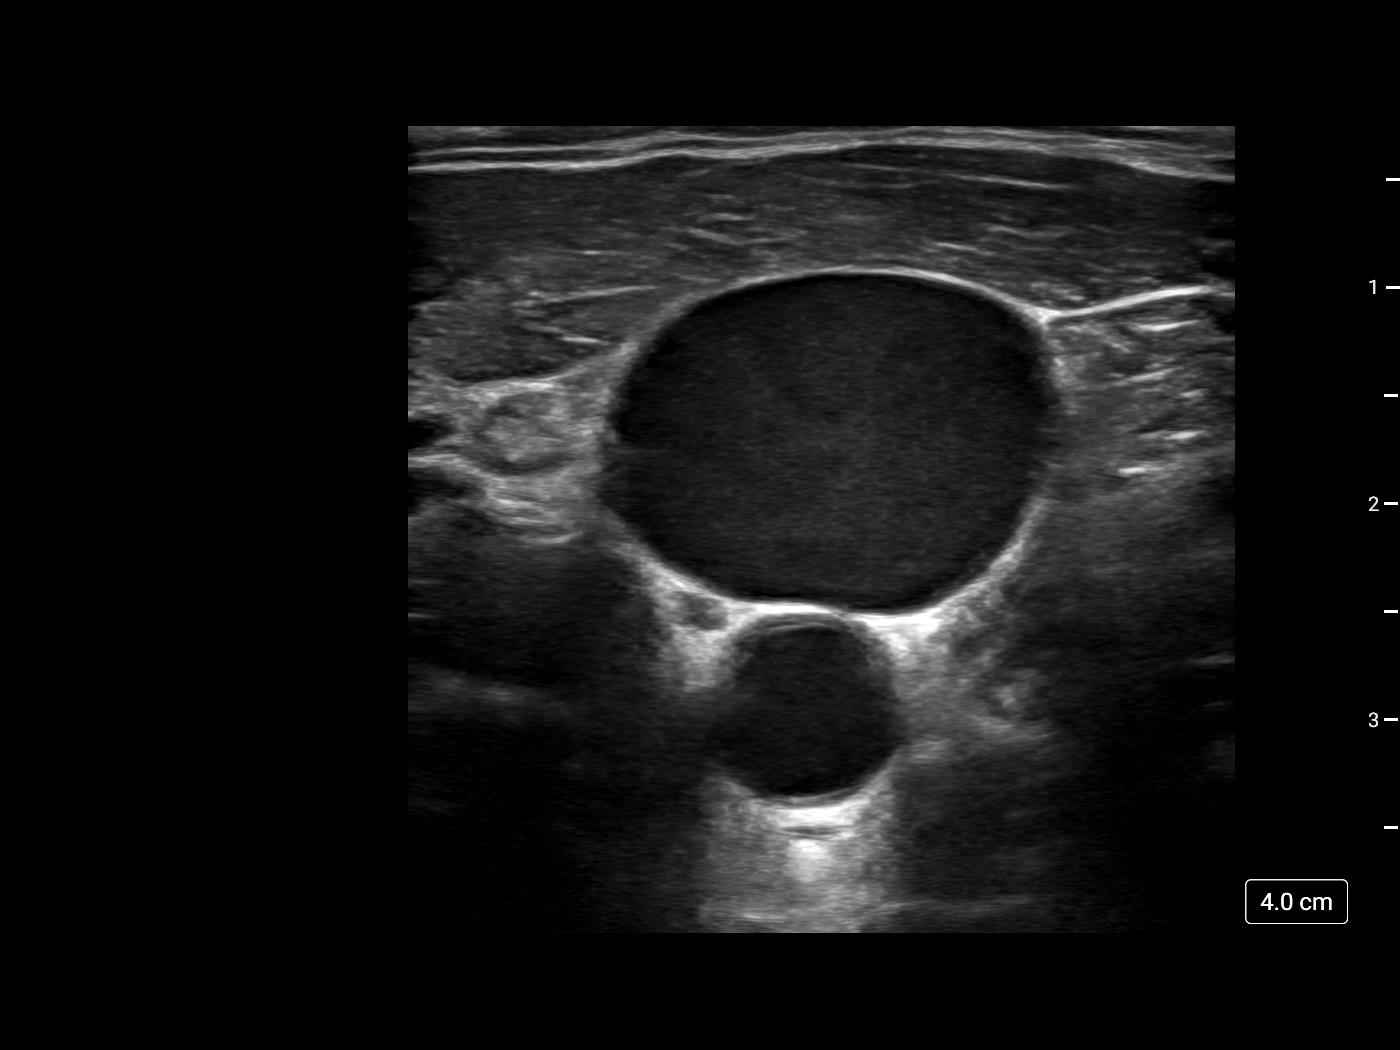
[im 2/2]
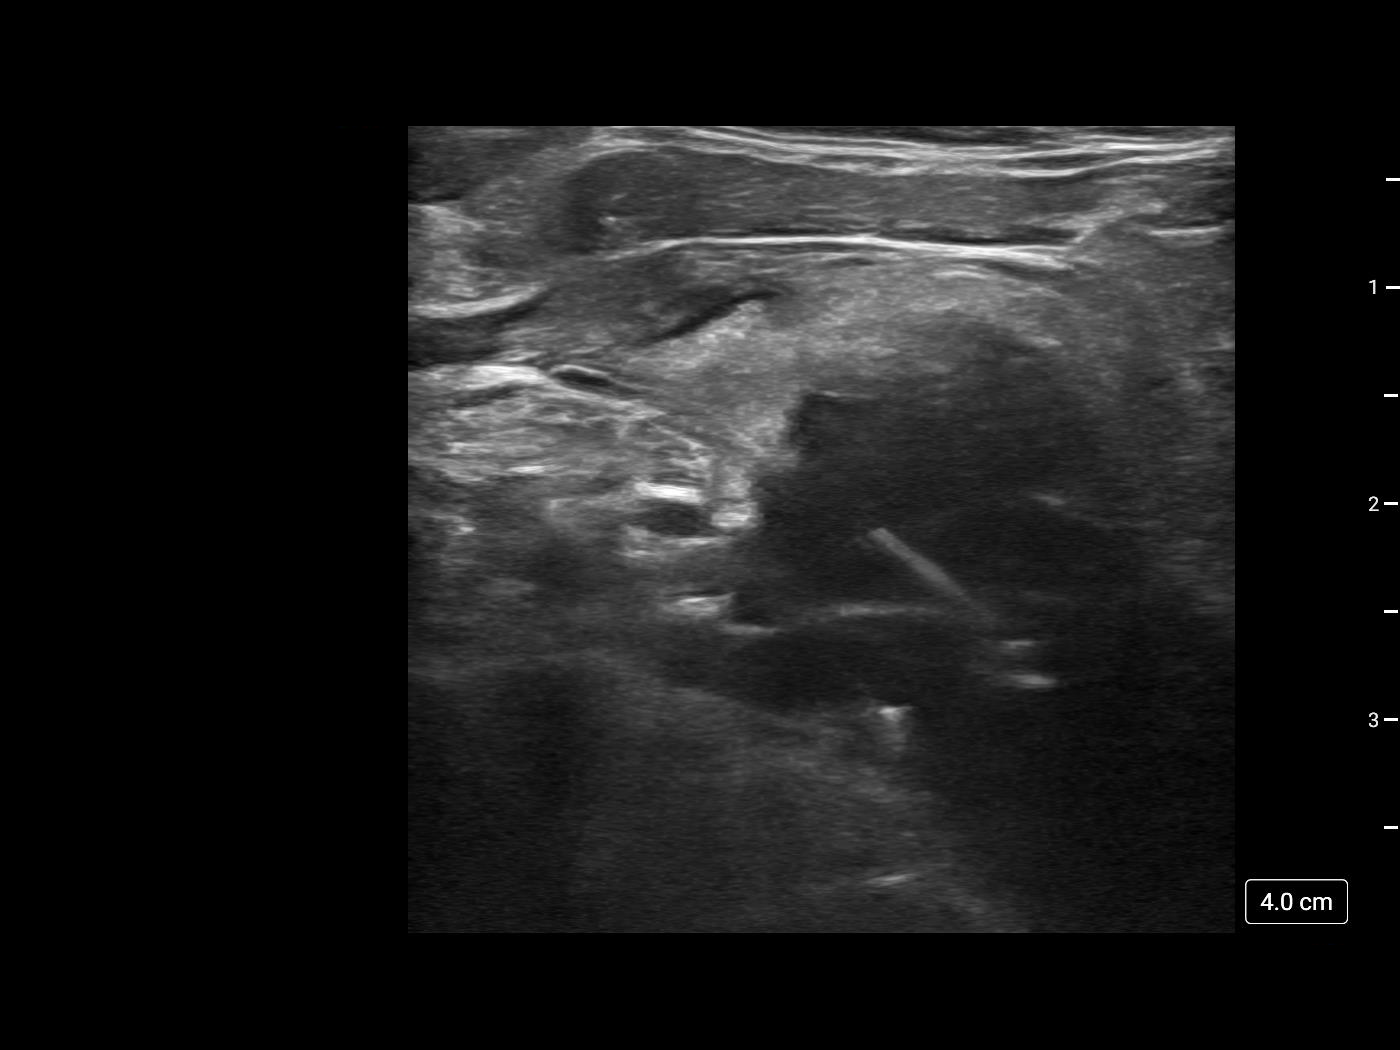

[Series 2: fl angio · 1 of 1 slices shown]
[im 1/1]
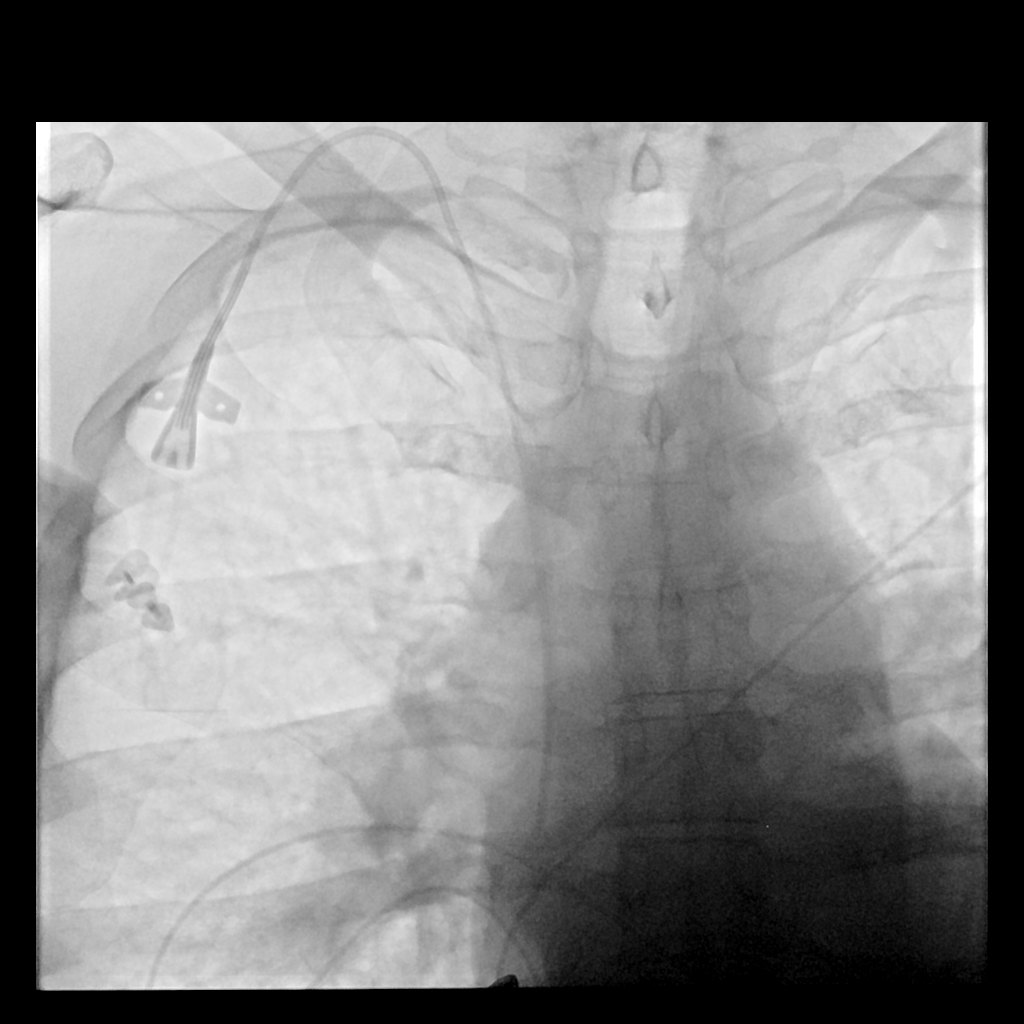

[3 of 3 positions shown; findings below may reference images not displayed]

EXAM:
IR RIGHT FLUORO GUIDE CV LINE; IR ULTRASOUND GUIDANCE VASC ACCESS
RIGHT

MEDICATIONS:
None.

ANESTHESIA/SEDATION:
None.

FLUOROSCOPY TIME:  Fluoroscopy Time: 0 minutes 18 seconds (4 mGy).

COMPLICATIONS:
None immediate.

PROCEDURE:
Informed written consent was obtained from the patient after a
thorough discussion of the procedural risks, benefits and
alternatives. All questions were addressed. Maximal Sterile Barrier
Technique was utilized including caps, mask, sterile gowns, sterile
gloves, sterile drape, hand hygiene and skin antiseptic. A timeout
was performed prior to the initiation of the procedure.

The right internal jugular vein was interrogated with ultrasound and
found to be widely patent. An image was obtained and stored for the
medical record. Local anesthesia was attained by infiltration with
1% lidocaine. A small dermatotomy was made. Under real-time
sonographic guidance, the vessel was punctured with a 21 gauge
micropuncture needle. Using standard technique, the initial micro
needle was exchanged over a 0.018 micro wire for a transitional 4
French micro sheath. The micro sheath was then exchanged for a
peel-away sheath.

A suitable skin exit site was selected inferior to the clavicle.
Local anesthesia was again attained by infiltration with 1%
lidocaine. A small dermatotomy was made. The power line was then
tunneled from the skin exit site to the dermatotomy overlying the
venous access site. The catheter was cut to length and advanced
through the peel-away sheath. The catheter tip was positioned at the
cavoatrial junction under fluoroscopy. In image was obtained and
stored for the medical record.

The catheter was flushed and capped. The catheter was secured to the
skin with 0 Prolene suture. The dermatotomy at the venous access
site was sealed with Dermabond. Sterile bandages were applied.
IMPRESSION: Successful placement of tunneled right IJ dual lumen power line.

## 2021-03-10 MED ORDER — METRONIDAZOLE 500 MG PO TABS
500.0000 mg | ORAL_TABLET | Freq: Two times a day (BID) | ORAL | Status: DC
  Administered 2021-03-11: 500 mg via ORAL
  Filled 2021-03-10 (×2): qty 1

## 2021-03-10 MED ORDER — METRONIDAZOLE 500 MG/100ML IV SOLN
500.0000 mg | Freq: Three times a day (TID) | INTRAVENOUS | Status: DC
  Administered 2021-03-10: 500 mg via INTRAVENOUS
  Filled 2021-03-10: qty 100

## 2021-03-10 MED ORDER — LORAZEPAM 2 MG/ML IJ SOLN
1.0000 mg | Freq: Once | INTRAMUSCULAR | Status: AC
  Administered 2021-03-10: 1 mg via INTRAVENOUS
  Filled 2021-03-10: qty 1

## 2021-03-10 MED ORDER — LIDOCAINE HCL 1 % IJ SOLN
INTRAMUSCULAR | Status: DC | PRN
  Administered 2021-03-10: 10 mL

## 2021-03-10 MED ORDER — HEPARIN SODIUM (PORCINE) 5000 UNIT/ML IJ SOLN
5000.0000 [IU] | Freq: Three times a day (TID) | INTRAMUSCULAR | Status: DC
  Administered 2021-03-10 – 2021-03-13 (×9): 5000 [IU] via SUBCUTANEOUS
  Filled 2021-03-10 (×9): qty 1

## 2021-03-10 MED ORDER — LIDOCAINE HCL 1 % IJ SOLN
INTRAMUSCULAR | Status: AC
  Filled 2021-03-10: qty 20

## 2021-03-10 NOTE — Progress Notes (Signed)
Rehab Admissions Coordinator Note:  Patient was screened by Clois Dupes for appropriateness for an Inpatient Acute Rehab Consult. Patient has not demonstrated the ability to tolerate the intensity required of a CIR admit. We will follow his progress, but will not place a rehab consult at this time.  Clois Dupes RN MSN 03/10/2021, 8:54 PM  I can be reached at [phone].

## 2021-03-10 NOTE — Procedures (Signed)
Interventional Radiology Procedure Note  Procedure: Right IJ tunneled Power Line.  Cath tip in distal SVC and ready for use.   Complications: None.   Estimated Blood Loss: None  Recommendations: - Routine line care   Signed,  Sterling Big, MD

## 2021-03-10 NOTE — Anesthesia Postprocedure Evaluation (Signed)
Anesthesia Post Note  Patient: Tavien Chestnut  Procedure(s) Performed: REPEAT DEBRIDEMENT LEFT LEG (Left) APPLICATION OF WOUND VAC     Patient location during evaluation: PACU Anesthesia Type: General Level of consciousness: awake and alert and oriented Pain management: pain level controlled Vital Signs Assessment: post-procedure vital signs reviewed and stable Respiratory status: spontaneous breathing, nonlabored ventilation and respiratory function stable Cardiovascular status: blood pressure returned to baseline Postop Assessment: no apparent nausea or vomiting Anesthetic complications: no Comments: Patient with excessive surgical site bleeding in PACU, EBL approximately 500cc since leaving OR. Pain poorly controlled with IV opioids. Patient awake and oriented. Discussed nerve block versus continued parenteral medications, patient elected to proceed with nerve block. Pain well controlled after popliteal/adductor blocks. BP downtrending and HR low 100s, Hgb on iStat 6.8, 2u pRBCs given with improvement in BP. CBC will be checked this evening on floor after transfusion completed. Stephannie Peters, MD             Kaylyn Layer

## 2021-03-10 NOTE — Progress Notes (Signed)
Physical Therapy Treatment Patient Details Name: Adam James MRN: [id number] DOB: Dec 27, 1970 Today's Date: 03/10/2021    History of Present Illness Pt is a 50 y.o. male admitted 02/24/21 with bilateral foot wounds. Pt with L foot diabetic infection with gas gangrene, R foot diabetic ulcer. Ortho workup reports LLE diabetic insensate neuropathy with necrotizing fasciitis involving the L anterior/lateral compartments and dorsum of L foot. S/p L foot I&D 8/1. S/p additional L foot and lower leg I&D with wound vac placement 8/3. Plan for LLE limb salvage with serial debridements next week. PMH includes DM2, neuropathy, CAD, cardiomyopathy.    PT Comments    Pt was seen for transfer to and from the chair in his room, then discussion with medical staff about orthotics for protection of LLE.  Pt is in pain to move LLE at all, but vomiting from taking meds and fluids mainly.  Talked with all his caregivers about his  needs, and will recommend that PT continue on with him to work toward his safe use of a walking device to protect LLE.  Pt is currently unable to do this due to his pain and lack of control of LLE for any WB work.     Follow Up Recommendations  CIR     Equipment Recommendations  Rolling walker with 5" wheels;3in1 (PT);Wheelchair (measurements PT);Wheelchair cushion (measurements PT)    Recommendations for Other Services       Precautions / Restrictions Precautions Precautions: Fall;Other (comment) Precaution Comments: LLE wound vac Restrictions Weight Bearing Restrictions: Yes RLE Weight Bearing: Weight bearing as tolerated LLE Weight Bearing: Weight bearing as tolerated    Mobility  Bed Mobility Overal bed mobility: Modified Independent             General bed mobility comments: pivoted to and from chair next to bed    Transfers Overall transfer level: Needs assistance Equipment used: None Transfers: Stand Pivot Transfers   Stand pivot transfers: Min guard        General transfer comment: pivot to chair and back with RLE but touching down LLE  Ambulation/Gait             General Gait Details: unable   Stairs             Wheelchair Mobility    Modified Rankin (Stroke Patients Only)       Balance Overall balance assessment: Needs assistance Sitting-balance support: Feet supported Sitting balance-[name] Scale: Good     Standing balance support: Single extremity supported Standing balance-[name] Scale: Poor                              Cognition Arousal/Alertness: Awake/alert Behavior During Therapy: Impulsive Overall Cognitive Status: No family/caregiver present to determine baseline cognitive functioning                                 General Comments: Pt is moving impulsively to and from the chair      Exercises      General Comments General comments (skin integrity, edema, etc.): Pt was up on side of bed and wanted to get to chair.  He is up pivoting on RLE and then began to vomit into blue bags.  Talked with nursing about this, pt is taking in only liquds and meds      Pertinent Vitals/Pain Pain Assessment: Faces Faces Pain Scale: Hurts even more  Pain Location: LLE with any movement Pain Descriptors / Indicators: Grimacing;Guarding;Discomfort;Moaning Pain Intervention(s): Repositioned;Monitored during session;Premedicated before session    Home Living                      Prior Function            PT Goals (current goals can now be found in the care plan section) Acute Rehab PT Goals Patient Stated Goal: to get home and hurt less Progress towards PT goals: PT to reassess next treatment    Frequency    Min 3X/week      PT Plan Current plan remains appropriate    Co-evaluation              AM-PAC PT "6 Clicks" Mobility   Outcome Measure  Help needed turning from your back to your side while in a flat bed without using bedrails?: None Help  needed moving from lying on your back to sitting on the side of a flat bed without using bedrails?: A Little Help needed moving to and from a bed to a chair (including a wheelchair)?: A Little Help needed standing up from a chair using your arms (e.g., wheelchair or bedside chair)?: A Lot Help needed to walk in hospital room?: A Lot Help needed climbing 3-5 steps with a railing? : A Lot 6 Click Score: 16    End of Session   Activity Tolerance: Patient limited by pain Patient left: in bed;with call bell/phone within reach;with bed alarm set;Other (comment) (in prevalon boots) Nurse Communication: Mobility status;Other (comment) (discussion about a lack of braces to manage LLE) PT Visit Diagnosis: Other abnormalities of gait and mobility (R26.89);Pain Pain - Right/Left: Left Pain - part of body: Ankle and joints of foot     Time: 0149-9692 (+1212-1229) PT Time Calculation (min) (ACUTE ONLY): 35 min  Charges:  $Therapeutic Activity: 38-52 mins              Ivar Drape 03/10/2021, 8:20 PM  Samul Dada, PT MS Acute Rehab Dept. Number: [facility] [id number] and [facility] [phone]

## 2021-03-10 NOTE — Progress Notes (Signed)
[facility]  Date of Admission:  02/24/2021      Total days of antibiotics 14   Daptomycin   Ceftriaxone 8/14   Metronidazole 8/15           ASSESSMENT: Adam James is a 50 y.o. 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 He has undergone 3 debridements last on 8/12. Intraoperative cultures as expected are polymicrobial with MRSA (R-bactrim, cipro), streptococcus agalactiae, Eikenella corrodens. He was intolerant to unasyn d/t significant nausea but seems to be doing much better on ceftriaxone + metronidazole alternatively. Will change metronidazole back to 500 mg PO BID >> bioavailability is equivalent and dose does not need to be more than BID given he is not bacteremic.   Daptomycin will not be an option outpatient given he is uninsured and would prefer to keep him off vancomycin given kidney disease at baseline.  I think doxycycline PO transition once ready for discharge home is a reasonable option to treat the MRSA component. ? If he is a candidate for abx assistance / charity care for Ceftriaxone once daily.  Possible plan to send him to CIR - will await dispo plan to see what options we have. He has tunneled PICC in place now for use. If he goes to CIR would continue current therapy while admitted.    PLAN: Change Metronidazole back to 500 mg PO BID  Continue daptomycin for now until we have plan for D/C in place.  May need to do PO doxyycline for MRSA coverage @ D/C d/t cost  Continue ceftriaxone (?patient assistance for outpatient use)     Principal Problem:   Gangrene of left foot (HCC) Active Problems:   Cardiomyopathy (HCC)   Type 2 diabetes mellitus (HCC)   Diabetic foot ulcer (HCC)   CAD (coronary artery disease)   Necrotizing fasciitis (HCC)   Left leg cellulitis   Diabetic polyneuropathy associated with type 2 diabetes mellitus (HCC)   Severe protein-calorie malnutrition  (HCC)    acetaminophen  1,000 mg Oral Q8H   aspirin EC  81 mg Oral Daily   docusate sodium  100 mg Oral BID   heparin  5,000 Units Subcutaneous Q8H   insulin aspart  0-9 Units Subcutaneous TID WC   insulin aspart  8 Units Subcutaneous TID WC   insulin glargine-yfgn  15 Units Subcutaneous QHS   lidocaine       metoprolol succinate  50 mg Oral Daily   scopolamine  1 patch Transdermal Q72H    SUBJECTIVE: Thirsty for juice and hungry. Pleased to see his nausea has resolved after switching off IV unasyn. His only concern is to be treated with something that will not afford him significant nausea. He states that he is preparing to be discharged to SNF.     Review of Systems: Review of Systems  Constitutional:  Negative for chills and fever.  HENT:  Negative for tinnitus.   Eyes:  Negative for blurred vision and photophobia.  Respiratory:  Negative for cough and sputum production.   Cardiovascular:  Negative for chest pain.  Gastrointestinal:  Negative for diarrhea, nausea and vomiting.  Genitourinary:  Negative for dysuria.  Skin:  Negative for rash.  Neurological:  Negative for headaches.   Allergies  Allergen Reactions   Morphine Itching    OBJECTIVE: Vitals:   03/09/21 0520 03/09/21 2034 03/10/21 0012 03/10/21 0523  BP: (!) 160/93 125/74 138/77  138/85  Pulse: (!) 102 (!) 113 (!) 109 (!) 108  Resp: 18 20 18 18   Temp: 99.1 F (37.3 C) 97.9 F (36.6 C) 99.4 F (37.4 C) 99.3 F (37.4 C)  TempSrc:      SpO2: 97% 93% 94% 93%  Weight:    114.4 kg  Height:       Body mass index is 31.52 kg/m.  Physical Exam Constitutional:      Appearance: He is well-developed.     Comments: Seated comfortably in chair during visit.   HENT:     Mouth/Throat:     Dentition: Normal dentition. No dental abscesses.  Cardiovascular:     Rate and Rhythm: Normal rate and regular rhythm.     Heart sounds: Normal heart sounds.  Pulmonary:     Effort: Pulmonary effort is normal.      Breath sounds: Normal breath sounds.  Abdominal:     General: There is no distension.     Palpations: Abdomen is soft.     Tenderness: There is no abdominal tenderness.  Feet:     Comments: Contracture of R foot with ulceration along the 5th metatarsal.  Left foot with wound vac in place. Serosanguinous drainage in container.  Lymphadenopathy:     Cervical: No cervical adenopathy.  Skin:    General: Skin is warm and dry.     Findings: No rash.  Neurological:     Mental Status: He is alert and oriented to person, place, and time.  Psychiatric:        Judgment: Judgment normal.     Comments: In good spirits today and engaged in care discussion.     Lab Results Lab Results  Component Value Date   WBC 21.9 (H) 03/10/2021   HGB 8.6 (L) 03/10/2021   HCT 25.2 (L) 03/10/2021   MCV 88.1 03/10/2021   PLT 375 03/10/2021    Lab Results  Component Value Date   CREATININE 2.67 (H) 03/10/2021   BUN 31 (H) 03/10/2021   NA 138 03/10/2021   K 3.5 03/10/2021   CL 98 03/10/2021   CO2 28 03/10/2021    Lab Results  Component Value Date   ALT 13 02/24/2021   AST 20 02/24/2021   ALKPHOS 97 02/24/2021   BILITOT 0.7 02/24/2021     Microbiology: No results found for this or any previous visit (from the past 240 hour(s)).   04/26/2021, MSN, NP-C [facility]  [email or web address] Pager: [phone] Office: [phone] [facility] Main Line: [phone]

## 2021-03-10 NOTE — Progress Notes (Signed)
Pt continuing to vomit frequently.  Pt educated not to "gulp" fluids, per patient he is not.  Pt is vomiting copious amount of brown/clear fluid.  Spoke with Dr. August Saucer regarding patient's continuous vomiting.  EKG done by this RN per MD order.  MD placed medication order and will administer per [facility] when available.  Pt also refusing to take any PO medication at this time.  He states that he vomited up the flagyl.  Pt also stated "why would they give me a medication that tastes gross?  Don't they know that I will just vomit it up anyway".  Pt educated that medication was given to help with infection in leg and that it was not done on purpose to make him vomit.  Pt stated "whatever."  Will continue to monitor.    03/10/21 0038  Provider Notification  Provider Name/Title August Saucer  Date Provider Notified 03/10/21  Time Provider Notified [phone]  Notification Type Page  Notification Reason Other (Comment) (constant vomiting)

## 2021-03-10 NOTE — Progress Notes (Addendum)
HD# SUBJECTIVE:  Patient Summary: Adam James is a 50 y.o. with a pertinent PMH of diabetes, ischemic heart disease, chronic heart failure with reduced EF, and CKD IIIa who presented s/p irrigation and debridement for left foot diabetic infection with gas gangrene and necrotizing fasciitis  Overnight Events: Continued to have multiple episodes of nausea and vomiting despite zofran, phenergan, and scopolamine patch. Was given 1mg  ativan for this.   Interim History: This is hospital day 15 for Adam James who was seen and evaluated at the bedside this morning. His nausea is markedly improved compared to yesterday, although the patient hasn't had anything to eat recently given his upcoming procedure for the tunneled cath for IV antibiotics. He continues to move his leg around and notes that he is able to get himself up to the bathroom. Denies any pain in his lower extremities. Also denies any chest pain, palpitations, or shortness of breath at this time. Encouraged the patient to work with PT/OT.  OBJECTIVE:  Vital Signs: Vitals:   03/09/21 0520 03/09/21 2034 03/10/21 0012 03/10/21 0523  BP: (!) 160/93 125/74 138/77 138/85  Pulse: (!) 102 (!) 113 (!) 109 (!) 108  Resp: 18 20 18 18   Temp: 99.1 F (37.3 C) 97.9 F (36.6 C) 99.4 F (37.4 C) 99.3 F (37.4 C)  TempSrc:      SpO2: 97% 93% 94% 93%  Weight:    114.4 kg  Height:       Supplemental O2: Room Air SpO2: 93 % O2 Flow Rate (L/min): 0 L/min FiO2 (%): 21 %  Filed Weights   03/08/21 0451 03/09/21 0059 03/10/21 0523  Weight: 117 kg 110.9 kg 114.4 kg     Intake/Output Summary (Last 24 hours) at 03/10/2021 0654 Last data filed at 03/10/2021 03/12/2021 Gross per 24 hour  Intake 680 ml  Output 775 ml  Net -95 ml   Net IO Since Admission: 8,590.33 mL [03/10/21 0654]  Physical Exam: Constitutional: well-appearing middle-aged male resting in bed , in no acute distress Cardiovascular: regular rate and rhythm, S1 and S2 present, no  m/r/g Pulmonary/Chest: normal work of breathing on room air, lungs clear to auscultation bilaterally MSK: normal bulk and tone in RLE with palpable radial and dorsalis pedis pulses on R 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 No obvious rash or lesions; sacral edema present  Psych: Normal mood and affect      ASSESSMENT/PLAN:  Assessment: Principal Problem:   Gangrene of left foot (HCC) Active Problems:   Cardiomyopathy (HCC)   Type 2 diabetes mellitus (HCC)   Diabetic foot ulcer (HCC)   CAD (coronary artery disease)   Necrotizing fasciitis (HCC)   Left leg cellulitis   Diabetic polyneuropathy associated with type 2 diabetes mellitus (HCC)   Severe protein-calorie malnutrition (HCC)   Plan: #LLE necrotizing fascitis s/p multiple I&D's Patient is s/p I&D on 8/3, 8/8 and 8/12 for attempted limb salvage. Patient has wound vac in place and continues to deny any pain at this time. He remains on IV daptomycin and unasyn was switched to IV rocephin and oral flagyl, in the setting of the patient's new onset nausea. His nausea did not improve with zofran or phenergan, but did improve after he received 1mg  of ativan overnight. Will hold off on further zofran or phenergan, as the patient now has a prolonged Qtc interval. Patient remains afebrile, however, his  white blood cell count did increase to 21.9 today from 14.. Encouraged to participate in PT/OT for discharge planning.  - ID consulted, appreciate recommendations  - IV daptomycin and rocephin, switching PO flagyl 500mg  BID to IV today - Orthopedic surgery consulted, appreciate recommendations - Monitor wound vac output  - Tunneled cath to be placed today for long term IV antibiotics - Encourage to participate in PT/OT  - Scopolamine patch for nausea   #Acute kidney injury on chronic kidney disease IIIa likely 2/2 acute tubular  necrosis  Initially noted to have worsening renal function in setting multiple I&D's. sCr slightly improved to 2.67 today. Continued to hold off on IV fluids due to hypervolemia in setting of HFrEF.  - Continue to monitor renal function - Avoid nephrotoxic agents - Supportive care and encourage PO intake  - If GFR improves to >30, will adjust DVT prophylaxis back to lovenox    #Acute on chronic normocytic anemia likely 2/2 acute blood loss #Hx of ischemic heart disease and HFrEF  Hb is stable this morning at 8.6. He continues to deny any chest pain, palpitations or dyspnea at this time. Hb goal remains >8 - Transfuse if Hb <8 - Metoprolol to 50mg  daily    #Type II DM with diabetic neuropathy CBG's slightly elevated today, 180s-210s on current regimen. Patient did not receive any meal time Novolog yesterday as he was unable to tolerate any oral intake because of his nausea and vomiting. - Continue Semglee 15U + Novolog 8U + SSI with meals    Diet: Carb-Modified IVF: None VTE: Heparin Code: Full PT/OT recs: Pending   Prior to Admission Living Arrangement: Home  Anticipated Discharge Location: Home w/HH vs SNF  Barriers to Discharge: Continued medical management, pending PT/OT recs  Dispo: Anticipated discharge to Skilled nursing facility pending clinical improvement and PT/OT recs .   Signature: , D.O.  Internal Medicine Resident, PGY-1 Internal Medicine Residency  Pager: [phone] 6:54 AM, 03/10/2021   Please contact the on call pager after 5 pm and on weekends at [phone].

## 2021-03-10 NOTE — Progress Notes (Signed)
Patient ID: Adam James, male   DOB: 06/06/1971, 50 y.o.   MRN: [id number] Patient is seen in follow-up for debridement necrotizing fasciitis left leg.  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  Discussed the importance of wearing the fracture boot as much as possible every day to prevent a permanent equinus contracture and to provide a stable foot for ambulation.  Discussed the importance of working with therapy with gait training.  Patient may be weightbearing as tolerated on the left.  Discussed that we will need to get him set up for a anterior AFO once he is discharged.

## 2021-03-10 NOTE — Progress Notes (Signed)
PT Cancellation Note  Patient Details Name: Travaris Kosh MRN: [id number] DOB: [birth year]   Cancelled Treatment:    Reason Eval/Treat Not Completed: Other (comment).  Pt is not in room and will retry as time and pt allow.   Ivar Drape 03/10/2021, 9:49 AM  Samul Dada, PT MS Acute Rehab Dept. Number: [facility] [id number] and [facility] [phone]

## 2021-03-10 NOTE — Progress Notes (Signed)
Pt has been vomiting, refusing PO medications and has refused eating for past day.  Overall, pt has been getting antibiotics for wound to L leg.  Will continue to monitor.   03/09/21 2034  Assess: MEWS Score  Temp 97.9 F (36.6 C)  BP 125/74  Pulse Rate (!) 113  Resp 20  Level of Consciousness Alert  SpO2 93 %  O2 Device Room Air  Assess: MEWS Score  MEWS Temp 0  MEWS Systolic 0  MEWS Pulse 2  MEWS RR 0  MEWS LOC 0  MEWS Score 2  MEWS Score Color Yellow

## 2021-03-10 NOTE — Progress Notes (Signed)
Orthopedic Tech Progress Note Patient Details:  Adam James [birth year] [id number]  Ortho Devices Type of Ortho Device: CAM walker, Postop shoe/boot Ortho Device/Splint Location: LLE, RLE Ortho Device/Splint Interventions: Ordered, Application, Adjustment   Post Interventions Patient Tolerated: Well Instructions Provided: Care of device  [name] 03/10/2021, 4:55 PM

## 2021-03-10 NOTE — Plan of Care (Signed)
Patient with x1 episode of vomiting once back from procedure this morning.  Stated he drank juice too fact and vomited then asked for milk.  No further vomiting noted this shift.     Problem: Education: Goal: Knowledge of General Education information will improve Description: Including pain rating scale, medication(s)/side effects and non-pharmacologic comfort measures Outcome: Progressing   Problem: Health Behavior/Discharge Planning: Goal: Ability to manage health-related needs will improve Outcome: Progressing   Problem: Clinical Measurements: Goal: Ability to maintain clinical measurements within normal limits will improve Outcome: Progressing Goal: Will remain free from infection Outcome: Progressing Goal: Diagnostic test results will improve Outcome: Progressing Goal: Respiratory complications will improve Outcome: Progressing Goal: Cardiovascular complication will be avoided Outcome: Progressing   Problem: Activity: Goal: Risk for activity intolerance will decrease Outcome: Progressing   Problem: Nutrition: Goal: Adequate nutrition will be maintained Outcome: Progressing   Problem: Coping: Goal: Level of anxiety will decrease Outcome: Progressing   Problem: Elimination: Goal: Will not experience complications related to bowel motility Outcome: Progressing Goal: Will not experience complications related to urinary retention Outcome: Progressing   Problem: Pain Managment: Goal: General experience of comfort will improve Outcome: Progressing   Problem: Safety: Goal: Ability to remain free from injury will improve Outcome: Progressing   Problem: Skin Integrity: Goal: Risk for impaired skin integrity will decrease Outcome: Progressing

## 2021-03-11 DIAGNOSIS — Z9889 Other specified postprocedural states: Secondary | ICD-10-CM

## 2021-03-11 DIAGNOSIS — K922 Gastrointestinal hemorrhage, unspecified: Secondary | ICD-10-CM

## 2021-03-11 LAB — BASIC METABOLIC PANEL
Anion gap: 8 (ref 5–15)
BUN: 36 mg/dL — ABNORMAL HIGH (ref 6–20)
CO2: 32 mmol/L (ref 22–32)
Calcium: 7.4 mg/dL — ABNORMAL LOW (ref 8.9–10.3)
Chloride: 95 mmol/L — ABNORMAL LOW (ref 98–111)
Creatinine, Ser: 2.24 mg/dL — ABNORMAL HIGH (ref 0.61–1.24)
GFR, Estimated: 35 mL/min — ABNORMAL LOW (ref >60)
Glucose, Bld: 195 mg/dL — ABNORMAL HIGH (ref 70–99)
Potassium: 3.2 mmol/L — ABNORMAL LOW (ref 3.5–5.1)
Sodium: 135 mmol/L (ref 135–145)

## 2021-03-11 LAB — CBC
HCT: 19.7 % — ABNORMAL LOW (ref 39.0–52.0)
HCT: 20.3 % — ABNORMAL LOW (ref 39.0–52.0)
Hemoglobin: 6.6 g/dL — CL (ref 13.0–17.0)
Hemoglobin: 6.9 g/dL — CL (ref 13.0–17.0)
MCH: 29.9 pg (ref 26.0–34.0)
MCH: 29.1 pg (ref 26.0–34.0)
MCHC: 33.5 g/dL (ref 30.0–36.0)
MCHC: 34 g/dL (ref 30.0–36.0)
MCV: 89.1 fL (ref 80.0–100.0)
MCV: 85.7 fL (ref 80.0–100.0)
Platelets: 314 10*3/uL (ref 150–400)
Platelets: 337 10*3/uL (ref 150–400)
RBC: 2.21 MIL/uL — ABNORMAL LOW (ref 4.22–5.81)
RBC: 2.37 MIL/uL — ABNORMAL LOW (ref 4.22–5.81)
RDW: 14.3 % (ref 11.5–15.5)
RDW: 16.1 % — ABNORMAL HIGH (ref 11.5–15.5)
WBC: 17.2 10*3/uL — ABNORMAL HIGH (ref 4.0–10.5)
WBC: 16.1 10*3/uL — ABNORMAL HIGH (ref 4.0–10.5)
nRBC: 0 % (ref 0.0–0.2)
nRBC: 0 % (ref 0.0–0.2)

## 2021-03-11 LAB — PREPARE RBC (CROSSMATCH)

## 2021-03-11 LAB — HEMOGLOBIN AND HEMATOCRIT, BLOOD
HCT: 20.4 % — ABNORMAL LOW (ref 39.0–52.0)
Hemoglobin: 6.7 g/dL — CL (ref 13.0–17.0)

## 2021-03-11 LAB — GLUCOSE, CAPILLARY
Glucose-Capillary: 185 mg/dL — ABNORMAL HIGH (ref 70–99)
Glucose-Capillary: 151 mg/dL — ABNORMAL HIGH (ref 70–99)
Glucose-Capillary: 113 mg/dL — ABNORMAL HIGH (ref 70–99)
Glucose-Capillary: 166 mg/dL — ABNORMAL HIGH (ref 70–99)

## 2021-03-11 MED ORDER — PANTOPRAZOLE SODIUM 40 MG IV SOLR
40.0000 mg | Freq: Two times a day (BID) | INTRAVENOUS | Status: DC
  Administered 2021-03-11 – 2021-03-12 (×3): 40 mg via INTRAVENOUS
  Filled 2021-03-11 (×3): qty 40

## 2021-03-11 MED ORDER — SODIUM CHLORIDE 0.9% IV SOLUTION: Freq: Once | INTRAVENOUS | Status: AC

## 2021-03-11 MED ORDER — SODIUM CHLORIDE 0.9% IV SOLUTION
Freq: Once | INTRAVENOUS | Status: AC
  Administered 2021-03-11: 10 mL/h via INTRAVENOUS

## 2021-03-11 MED ORDER — METRONIDAZOLE 500 MG/100ML IV SOLN
500.0000 mg | Freq: Two times a day (BID) | INTRAVENOUS | Status: DC
  Administered 2021-03-11 – 2021-03-13 (×4): 500 mg via INTRAVENOUS
  Filled 2021-03-11 (×4): qty 100

## 2021-03-11 NOTE — Plan of Care (Signed)
  Problem: Clinical Measurements: Goal: Cardiovascular complication will be avoided Outcome: Progressing   Problem: Coping: Goal: Level of anxiety will decrease Outcome: Progressing   Problem: Pain Managment: Goal: General experience of comfort will improve Outcome: Progressing   Problem: Safety: Goal: Ability to remain free from injury will improve Outcome: Progressing   

## 2021-03-11 NOTE — Progress Notes (Signed)
[facility]  Date of Admission:  02/24/2021      Total days of antibiotics 15   Daptomycin   Ceftriaxone 8/14   Metronidazole 8/15           ASSESSMENT: Adam James is a 50 y.o. 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 He has undergone 3 debridements last on 8/12. Intraoperative cultures as expected are polymicrobial with MRSA (R-bactrim, cipro), streptococcus agalactiae, Eikenella corrodens.  Given coffee ground emesis, drop in hgb I suspect much of his GI side effects may be due more to gastritis vs GIB process not necessarily antibiotics, though the taste of the metronidazole he affirms is horrible and provokes vomit every time for him. I wonder if we can put him back on unasyn in light of this.  Agree with GI evaluation.   Daptomycin may be prohibiting outpatient due to cost but ultimately preferred with safety if we need to continue to treat with IV - d/w Jeri Modena with [facility] services and updated her on  his condition and barriers.  Until vomiting is under better control cannot consider PO treatment at present.  He has tunneled PICC in place now for access.    PLAN: Continue current therapy with Ceftriaxone, Daptomycin and metronidazole IV  May consider putting him back on Dapto + unasyn combo  Agree with GI evaluation Will follow intermittently given discharge barriers currently.     Principal Problem:   Gangrene of left foot (HCC) Active Problems:   Cardiomyopathy (HCC)   Type 2 diabetes mellitus (HCC)   Diabetic foot ulcer (HCC)   CAD (coronary artery disease)   Necrotizing fasciitis (HCC)   Left leg cellulitis   Diabetic polyneuropathy associated with type 2 diabetes mellitus (HCC)   Severe protein-calorie malnutrition (HCC)    acetaminophen  1,000 mg Oral Q8H   aspirin EC  81 mg Oral Daily   docusate sodium  100 mg Oral BID   heparin  5,000 Units Subcutaneous Q8H    insulin aspart  0-9 Units Subcutaneous TID WC   insulin aspart  8 Units Subcutaneous TID WC   insulin glargine-yfgn  15 Units Subcutaneous QHS   metoprolol succinate  50 mg Oral Daily   pantoprazole (PROTONIX) IV  40 mg Intravenous Q12H   scopolamine  1 patch Transdermal Q72H    SUBJECTIVE: Having problems with nausea and continuous vomiting. Describes the vomit to be dark blood. He does have general abdominal discomfort. Having a hard time participating in rehab due to the pain on the foot so has not done much other than getting in and out of the chair. & bed.    Review of Systems: Review of Systems  Constitutional:  Negative for chills and fever.  HENT:  Negative for tinnitus.   Eyes:  Negative for blurred vision and photophobia.  Respiratory:  Negative for cough and sputum production.   Cardiovascular:  Negative for chest pain.  Gastrointestinal:  Positive for abdominal pain, nausea and vomiting. Negative for diarrhea.  Genitourinary:  Negative for dysuria.  Musculoskeletal:  Positive for joint pain.  Skin:  Negative for rash.  Neurological:  Negative for headaches.    Allergies  Allergen Reactions   Morphine Itching    OBJECTIVE: Vitals:   03/11/21 0630 03/11/21 0700 03/11/21 0830 03/11/21 1105  BP: 129/79 131/83  117/76  Pulse: (!) 107  (!) 110 (!) 110  Resp: 16  16 16  Temp: 99.3 F (37.4 C)  98.8 F (37.1 C) 99.6 F (37.6 C)  TempSrc: Oral  Oral Oral  SpO2: 97%  96% 98%  Weight:      Height:       Body mass index is 32.35 kg/m.  Physical Exam Constitutional:      Appearance: He is well-developed.     Comments: Resting in bed. Appears miserable with several episodes of emesis over our visit.   HENT:     Mouth/Throat:     Dentition: Normal dentition. No dental abscesses.  Cardiovascular:     Rate and Rhythm: Normal rate and regular rhythm.     Heart sounds: Normal heart sounds.  Pulmonary:     Effort: Pulmonary effort is normal.     Breath sounds:  Normal breath sounds.  Abdominal:     General: There is no distension.     Palpations: Abdomen is soft.     Tenderness: There is generalized abdominal tenderness.     Comments: Coffee ground emesis, dark dark bloody thin emesis during our visit.   Feet:     Comments: Contracture of R foot with ulceration along the 5th metatarsal.  Left foot with wound vac in place. Serosanguinous drainage in container.  Lymphadenopathy:     Cervical: No cervical adenopathy.  Skin:    General: Skin is warm and dry.     Findings: No rash.  Neurological:     Mental Status: He is alert and oriented to person, place, and time.  Psychiatric:        Judgment: Judgment normal.    Lab Results Lab Results  Component Value Date   WBC 17.2 (H) 03/11/2021   HGB 6.7 (LL) 03/11/2021   HCT 20.4 (L) 03/11/2021   MCV 89.1 03/11/2021   PLT 314 03/11/2021    Lab Results  Component Value Date   CREATININE 2.24 (H) 03/11/2021   BUN 36 (H) 03/11/2021   NA 135 03/11/2021   K 3.2 (L) 03/11/2021   CL 95 (L) 03/11/2021   CO2 32 03/11/2021    Lab Results  Component Value Date   ALT 13 02/24/2021   AST 20 02/24/2021   ALKPHOS 97 02/24/2021   BILITOT 0.7 02/24/2021     Microbiology: No results found for this or any previous visit (from the past 240 hour(s)).   Rexene Alberts, MSN, NP-C [facility]  [email or web address] Pager: [phone] Office: [phone] [facility] Main Line: [phone]

## 2021-03-11 NOTE — Consult Note (Addendum)
[facility] Consult: 1:17 PM 03/11/2021  LOS: 15 days    Referring Provider: Dr Amada Jupiter TS resident  Primary Care Physician:  Patient, No Pcp Per (Inactive) Primary Gastroenterologist:  unassigned     Reason for Consultation: Sudden onset CGE, acute drop Hg   HPI: Adam James is a 50 y.o. male.  Week 3 of hospitalization.  PMH NIDDM.   CAD, remote PCI.  Ischemic cardiomyopathy.  08/2020 cardiac cath : Severe multivessel CAD.  EF by echo 30 to 35%.  Diabetic neuropathy.  Diabetic foot disease.  Status post right foot partial fifth ray resection 05/2019.  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  He is not sure what was surgically removed or whether this involved large or small intestine.  Has never had a colonoscopy or EGD.  During DKA admission to [facility] in July 2020 he was transitioned to insulin but not using this as outpt.   Does not have a PCP.  Discharge note mentions a CT at outside hospital showed diffuse, mild, submucosal edema of esophagus and part of the duodenum.  This was attributed to multiple bouts of emesis during DKA.  Patient was started on Protonix.  No longer takes this or any PPI or H2 blocker.   Now working on third week of admission.  Initially presented 8/1.  He had presurgical work-up that day for triple bypass.   Abnormal labs prompted direct admission.  He was vomiting, this was not coffee grounds, in triage that day.  Hypotensive and tachycardic.  Patient also describes a foot ulcer, swelling, erythema in the left leg.  Difficult to walk.  Diagnosed with leg abscess, evolved to necrotizing fasciitis.  Status post multiple I&D's on 8 3, 8/8, 8/12 for attempted limb salvage.  Wound VAC in place.  IV daptomycin,  Rocephin, Flagyl in place.  Sustained AKI secondary to acute tubular necrosis. Patient describes baseline of intermittent, alternating constipation, diarrhea at home.  Since admission and initiation of antibiotics he has had persistent brown, watery stools.  No abdominal pain.  Also since admission has sense of accumulation of phlegm in his throat which he spits up.  Yesterday the material became coffee ground.  He has been receiving DVT prophylaxis with SQ heparin.  Before the coffee-ground emesis ever occurred he has required several PRBCs.,  7 so far this admission, the last one was this morning. Hgb was ranging in the mid eights to 9 range a few days ago but dropped to 6.6 this morning, 6.7 after 1 PRBC.  No melena or bleeding per rectum observed.  Denies use of NSAIDs at home.  Smoker.  Denies alcohol use.  Hypertension, heart failure run in the family.    Past Medical History:  Diagnosis Date   CAD (coronary artery disease)    Cardiomyopathy (HCC)    Diabetic foot ulcer (HCC)    Dyslipidemia    Heart failure (HCC)    Myocardial infarction (HCC)    Type 2 diabetes mellitus (HCC)  Past Surgical History:  Procedure Laterality Date   APPENDECTOMY     APPLICATION OF WOUND VAC Left 03/03/2021   Procedure: APPLICATION OF WOUND VAC;  Surgeon: Tarry Kos, MD;  Location: [facility] OR;  Service: Orthopedics;  Laterality: Left;   APPLICATION OF WOUND VAC  03/07/2021   Procedure: APPLICATION OF WOUND VAC;  Surgeon: [name], MD;  Location: [facility] OR;  Service: Orthopedics;;   CARDIAC CATHETERIZATION     COLON SURGERY     I & D EXTREMITY Left 02/24/2021   Procedure: IRRIGATION AND DEBRIDEMENT EXTREMITY;  Surgeon: Myrene Galas, MD;  Location: [facility] OR;  Service: Orthopedics;  Laterality: Left;   I & D EXTREMITY Left 02/26/2021   Procedure: IRRIGATION AND DEBRIDEMENT OF LEG;  Surgeon: [name], MD;  Location: [facility] OR;  Service: Orthopedics;  Laterality: Left;   I & D EXTREMITY Left 03/03/2021    Procedure: LEFT LEG IRRIGATION AND DEBRIDEMENT;  Surgeon: Tarry Kos, MD;  Location: [facility] OR;  Service: Orthopedics;  Laterality: Left;   I & D EXTREMITY Left 03/07/2021   Procedure: REPEAT DEBRIDEMENT LEFT LEG;  Surgeon: [name], MD;  Location: [facility] OR;  Service: Orthopedics;  Laterality: Left;   IR FLUORO GUIDE CV LINE RIGHT  03/10/2021   IR US GUIDE VASC ACCESS RIGHT  03/10/2021   LEFT HEART CATH AND CORONARY ANGIOGRAPHY N/A 09/13/2020   Procedure: LEFT HEART CATH AND CORONARY ANGIOGRAPHY;  Surgeon: Lennette Bihari, MD;  Location: [facility] INVASIVE CV LAB;  Service: Cardiovascular;  Laterality: N/A;   TOE AMPUTATION  2020    Prior to Admission medications   Medication Sig Start Date End Date Taking? Authorizing Provider  amLODipine (NORVASC) 5 MG tablet Take 1 tablet (5 mg total) by mouth daily. 02/20/21  Yes Baldo Daub, MD  aspirin EC 81 MG tablet Take 1 tablet (81 mg total) by mouth daily. Swallow whole. 02/20/21  Yes Baldo Daub, MD  atorvastatin (LIPITOR) 80 MG tablet Take 1 tablet (80 mg total) by mouth daily. 02/20/21  Yes Baldo Daub, MD  furosemide (LASIX) 40 MG tablet Take 1 tablet (40 mg total) by mouth daily. Take 40 mg every other day alternating with 2 tablets ( ) every other day Patient taking differently: Take 40-80 mg by mouth daily. Take 40 mg every other day alternating with 2 tablets ( ) every other day 02/20/21  Yes Baldo Daub, MD  glyBURIDE (DIABETA) 5 MG tablet Take 5 mg by mouth 2 (two) times daily. 10/12/20  Yes [provider]  irbesartan (AVAPRO) 75 MG tablet Take 1 tablet (75 mg total) by mouth daily. 01/20/21  Yes Baldo Daub, MD  metFORMIN (GLUCOPHAGE) 500 MG tablet Take 500 mg by mouth 2 (two) times daily. 10/12/20  Yes [provider]  metoprolol succinate (TOPROL-XL) 25 MG 24 hr tablet Take 1 tablet (25 mg total) by mouth daily. 01/20/21  Yes Baldo Daub, MD  silver sulfADIAZINE (SILVADENE) 1 % cream Apply 1 application  topically daily.   Yes [provider]    Scheduled Meds:  acetaminophen  1,000 mg Oral Q8H   aspirin EC  81 mg Oral Daily   docusate sodium  100 mg Oral BID   heparin  5,000 Units Subcutaneous Q8H   insulin aspart  0-9 Units Subcutaneous TID WC   insulin aspart  8 Units Subcutaneous TID WC   insulin glargine-yfgn  15 Units Subcutaneous QHS   metoprolol succinate  50 mg Oral  Daily   pantoprazole (PROTONIX) IV  40 mg Intravenous Q12H   scopolamine  1 patch Transdermal Q72H   Infusions:  cefTRIAXone (ROCEPHIN)  IV Stopped (03/10/21 1448)   DAPTOmycin (CUBICIN)  IV 800 mg (03/10/21 2013)   metronidazole     promethazine (PHENERGAN) injection (IM or IVPB) 12.5 mg (03/10/21 2247)   PRN Meds: alum & mag hydroxide-simeth, HYDROmorphone, lidocaine, polyethylene glycol, promethazine (PHENERGAN) injection (IM or IVPB), senna   Allergies as of 02/24/2021 - Review Complete 02/24/2021  Allergen Reaction Noted   Morphine Itching 02/23/2019    Family History  Problem Relation Age of Onset   Hypertension Mother    Heart failure Father    Hypertension Brother    Congestive Heart Failure Brother     Social History   Socioeconomic History   Marital status: Single    Spouse name: Not on file   Number of children: Not on file   Years of education: Not on file   Highest education level: Not on file  Occupational History   Not on file  Tobacco Use   Smoking status: Every Day    Types: Cigarettes   Smokeless tobacco: Never   Tobacco comments:    5-6 daily  Vaping Use   Vaping Use: Never used  Substance and Sexual Activity   Alcohol use: Not Currently    Comment: Very rare   Drug use: Not Currently    Types: Marijuana   Sexual activity: Not on file  Other Topics Concern   Not on file  Social History Narrative   Not on file   Social Determinants of Health   Financial Resource Strain: High Risk   Difficulty of Paying Living Expenses: Very hard  Food Insecurity: No  Food Insecurity   Worried About Programme researcher, broadcasting/film/video in the Last Year: Never true   Barista in the Last Year: Never true  Transportation Needs: Unmet Transportation Needs   Lack of Transportation (Medical): Yes   Lack of Transportation (Non-Medical): Yes  Physical Activity: Not on file  Stress: Not on file  Social Connections: Not on file  Intimate Partner Violence: Not on file    REVIEW OF SYSTEMS: Constitutional: Feels okay.  No profound weakness ENT:  No nose bleeds Pulm: No shortness of breath.  Clear cough. CV:  No palpitations, no LE edema.  GU:  No hematuria, no frequency GI: See HPI. Heme: Denies unusual or excessive bleeding or bruising prior to admission. Transfusions: See HPI. Neuro:  No headaches, no seizures.  No syncope.  Numbness and tingling in his feet. Endocrine:  No sweats or chills.  No polyuria or dysuria Immunization: Not queried   PHYSICAL EXAM: Vital signs in last 24 hours: Vitals:   03/11/21 0830 03/11/21 1105  BP:  117/76  Pulse: (!) 110 (!) 110  Resp: 16 16  Temp: 98.8 F (37.1 C) 99.6 F (37.6 C)  SpO2: 96% 98%   Wt Readings from Last 3 Encounters:  03/11/21 117.4 kg  02/21/21 110.2 kg  02/18/21 109.8 kg    General: Patient looks generally unwell but not acutely ill.  He is alert.  Sitting up in bed, comfortable. Head: No facial asymmetry or swelling.  No signs of head trauma Eyes: No conjunctival pallor.  No scleral icterus. Ears: Not hard of hearing Nose: No congestion or discharge Mouth: No ulcers or blood in the mouth.  Fair dentition.  Tongue midline.  Mucosa moist and pink. Neck: No JVD, no masses,  no thyromegaly, no tenderness. Lungs: Clear bilaterally without labored breathing or cough Heart: RRR.  No MRG.  S1, S2 present Abdomen: Soft without tenderness.  No HSM, masses, bruits..   Rectal: Deferred Musc/Skeltl: No joint redness or swelling observed.  There is a gauze wrap on the right foot ankle and extensive bandaging  on the left lower extremity.  Neurologic: Alert.  Oriented x3.  Moves all 4 limbs.  No tremors.  No gross weakness or deficits. Skin: No rash, no sores. Nodes: No cervical adenopathy. Psych: A bit of a short fuse.  Peeved by having to answer questions.  Does not seem depressed.  Intake/Output from previous day: 08/15 0701 - 08/16 0700 In: 1266 [P.O.:720; Blood:30; IV Piggyback:516] Out: -  Intake/Output this shift: Total I/O In: 606.7 [P.O.:240; Blood:366.7] Out: -   LAB RESULTS: Recent Labs    03/09/21 0355 03/10/21 0334 03/11/21 0353 03/11/21 1109  WBC 14.9* 21.9* 17.2*  --   HGB 9.0* 8.6* 6.6* 6.7*  HCT 26.3* 25.2* 19.7* 20.4*  PLT 321 375 314  --    BMET Lab Results  Component Value Date   NA 135 03/11/2021   NA 138 03/10/2021   NA 136 03/09/2021   K 3.2 (L) 03/11/2021   K 3.5 03/10/2021   K 4.0 03/09/2021   CL 95 (L) 03/11/2021   CL 98 03/10/2021   CL 101 03/09/2021   CO2 32 03/11/2021   CO2 28 03/10/2021   CO2 25 03/09/2021   GLUCOSE 195 (H) 03/11/2021   GLUCOSE 188 (H) 03/10/2021   GLUCOSE 178 (H) 03/09/2021   BUN 36 (H) 03/11/2021   BUN 31 (H) 03/10/2021   BUN 32 (H) 03/09/2021   CREATININE 2.24 (H) 03/11/2021   CREATININE 2.67 (H) 03/10/2021   CREATININE 2.76 (H) 03/09/2021   CALCIUM 7.4 (L) 03/11/2021   CALCIUM 7.6 (L) 03/10/2021   CALCIUM 7.6 (L) 03/09/2021   LFT No results for input(s): PROT, ALBUMIN, AST, ALT, ALKPHOS, BILITOT, BILIDIR, IBILI in the last 72 hours. PT/INR Lab Results  Component Value Date   INR 1.3 (H) 02/21/2021   Hepatitis Panel No results for input(s): HEPBSAG, HCVAB, HEPAIGM, HEPBIGM in the last 72 hours. C-Diff No components found for: CDIFF Lipase     Component Value Date/Time   LIPASE 30 02/24/2021 1708    Drugs of Abuse  No results found for: LABOPIA, COCAINSCRNUR, LABBENZ, AMPHETMU, THCU, LABBARB   RADIOLOGY STUDIES: IR Fluoro Guide CV Line Right  Result Date: 03/10/2021 INDICATION: 50 year old male  with a history of left foot necrotizing fasciitis. He requires durable venous access for long-term IV antibiotic therapy. Given poor renal function, arm vein should be preserved for potential future dialysis access. Therefore, he presents for placement of a tunneled central line. EXAM: IR RIGHT FLUORO GUIDE CV LINE; IR ULTRASOUND GUIDANCE VASC ACCESS RIGHT MEDICATIONS: None. ANESTHESIA/SEDATION: None. FLUOROSCOPY TIME:  Fluoroscopy Time: 0 minutes 18 seconds (4 mGy). COMPLICATIONS: None immediate. PROCEDURE: Informed written consent was obtained from the patient after a thorough discussion of the procedural risks, benefits and alternatives. All questions were addressed. Maximal Sterile Barrier Technique was utilized including caps, mask, sterile gowns, sterile gloves, sterile drape, hand hygiene and skin antiseptic. A timeout was performed prior to the initiation of the procedure. The right internal jugular vein was interrogated with ultrasound and found to be widely patent. An image was obtained and stored for the medical record. Local anesthesia was attained by infiltration with 1% lidocaine. A small dermatotomy was made. Under  real-time sonographic guidance, the vessel was punctured with a 21 gauge micropuncture needle. Using standard technique, the initial micro needle was exchanged over a 0.018 micro wire for a transitional 4 [place] micro sheath. The micro sheath was then exchanged for a peel-away sheath. A suitable skin exit site was selected inferior to the clavicle. Local anesthesia was again attained by infiltration with 1% lidocaine. A small dermatotomy was made. The power line was then tunneled from the skin exit site to the dermatotomy overlying the venous access site. The catheter was cut to length and advanced through the peel-away sheath. The catheter tip was positioned at the cavoatrial junction under fluoroscopy. In image was obtained and stored for the medical record. The catheter was flushed and  capped. The catheter was secured to the skin with 0 Prolene suture. The dermatotomy at the venous access site was sealed with Dermabond. Sterile bandages were applied. IMPRESSION: Successful placement of tunneled right IJ dual lumen power line. Electronically Signed   By: Malachy Moan M.D.   On: 03/10/2021 10:27   IR US Guide Vasc Access Right  Result Date: 03/10/2021 INDICATION: 50 year old male with a history of left foot necrotizing fasciitis. He requires durable venous access for long-term IV antibiotic therapy. Given poor renal function, arm vein should be preserved for potential future dialysis access. Therefore, he presents for placement of a tunneled central line. EXAM: IR RIGHT FLUORO GUIDE CV LINE; IR ULTRASOUND GUIDANCE VASC ACCESS RIGHT MEDICATIONS: None. ANESTHESIA/SEDATION: None. FLUOROSCOPY TIME:  Fluoroscopy Time: 0 minutes 18 seconds (4 mGy). COMPLICATIONS: None immediate. PROCEDURE: Informed written consent was obtained from the patient after a thorough discussion of the procedural risks, benefits and alternatives. All questions were addressed. Maximal Sterile Barrier Technique was utilized including caps, mask, sterile gowns, sterile gloves, sterile drape, hand hygiene and skin antiseptic. A timeout was performed prior to the initiation of the procedure. The right internal jugular vein was interrogated with ultrasound and found to be widely patent. An image was obtained and stored for the medical record. Local anesthesia was attained by infiltration with 1% lidocaine. A small dermatotomy was made. Under real-time sonographic guidance, the vessel was punctured with a 21 gauge micropuncture needle. Using standard technique, the initial micro needle was exchanged over a 0.018 micro wire for a transitional 4 [place] micro sheath. The micro sheath was then exchanged for a peel-away sheath. A suitable skin exit site was selected inferior to the clavicle. Local anesthesia was again attained by  infiltration with 1% lidocaine. A small dermatotomy was made. The power line was then tunneled from the skin exit site to the dermatotomy overlying the venous access site. The catheter was cut to length and advanced through the peel-away sheath. The catheter tip was positioned at the cavoatrial junction under fluoroscopy. In image was obtained and stored for the medical record. The catheter was flushed and capped. The catheter was secured to the skin with 0 Prolene suture. The dermatotomy at the venous access site was sealed with Dermabond. Sterile bandages were applied. IMPRESSION: Successful placement of tunneled right IJ dual lumen power line. Electronically Signed   By: Malachy Moan M.D.   On: 03/10/2021 10:27     IMPRESSION:   Coffee ground emesis.  Previous history of CT diagnosis esophagitis, duodenitis 01/2019.  Not on PPI or H2 blocker at home.  Only just initiated on Protonix 40 mg IV bid as of 830 this morning     Anemia.  Status post a total of 6 PRBCs  AKI, improving.  Baseline renal function not established     Multivessel CAD with ischemic cardiomyopathy.  EF 30 to 35%.  Plans for cardiac bypass on hold due to development of necrotizing fasciitis  LLE ulcer and necrotizing fasciitis.  Blood culture PCR positive for MRSA.  Status post multiple I&D's, wound VAC placement.     DM2.  Hemoglobin A1c 8.8.  Hypoalbuminemia.  Albumin 1.915 days ago.  Otherwise normal LFTs.  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  Details unknown but may have been a volvulus versus obstruction due to adhesion.  Extent of bowel surgery or whether this involved large or small intestine unknown.    PLAN:       Egd tmrw.  Has been drinking liquids today.  Patient agreeable to proceed   Sarah Gribbin  03/11/2021, 1:17 PM Phone [phone]    Attending Physician's Attestation   I have taken an interval history, reviewed the chart and examined the patient.   49 y/o m with poorly  controlled DM and CAD/CHF hospitalized with necrotizing fasciitis of LLE, with 2 days of coughing up bloody phlegm.  No outright emesis per patient.  No abdominal pain.  No overt GI bleeding (melena, hematochezia).  Hgb dropped abruptl, suspicious for upper GI bleed.  Start PPI, NPO AM, EGD tomorrow   I agree with the Advanced Practitioner's note, impression, and recommendations with updates and my documentation above.   Laylah Riga, MD [facility]  

## 2021-03-11 NOTE — H&P (View-Only) (Signed)
[facility] Consult: 1:17 PM 03/11/2021  LOS: 15 days    Referring Provider: Dr Amada Jupiter TS resident  Primary Care Physician:  Patient, No Pcp Per (Inactive) Primary Gastroenterologist:  unassigned     Reason for Consultation: Sudden onset CGE, acute drop Hg   HPI: Alonte Wulff is a 50 y.o. male.  Week 3 of hospitalization.  PMH NIDDM.   CAD, remote PCI.  Ischemic cardiomyopathy.  08/2020 cardiac cath : Severe multivessel CAD.  EF by echo 30 to 35%.  Diabetic neuropathy.  Diabetic foot disease.  Status post right foot partial fifth ray resection 05/2019.  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  He is not sure what was surgically removed or whether this involved large or small intestine.  Has never had a colonoscopy or EGD.  During DKA admission to [facility] in July 2020 he was transitioned to insulin but not using this as outpt.   Does not have a PCP.  Discharge note mentions a CT at outside hospital showed diffuse, mild, submucosal edema of esophagus and part of the duodenum.  This was attributed to multiple bouts of emesis during DKA.  Patient was started on Protonix.  No longer takes this or any PPI or H2 blocker.   Now working on third week of admission.  Initially presented 8/1.  He had presurgical work-up that day for triple bypass.   Abnormal labs prompted direct admission.  He was vomiting, this was not coffee grounds, in triage that day.  Hypotensive and tachycardic.  Patient also describes a foot ulcer, swelling, erythema in the left leg.  Difficult to walk.  Diagnosed with leg abscess, evolved to necrotizing fasciitis.  Status post multiple I&D's on 8 3, 8/8, 8/12 for attempted limb salvage.  Wound VAC in place.  IV daptomycin,  Rocephin, Flagyl in place.  Sustained AKI secondary to acute tubular necrosis. Patient describes baseline of intermittent, alternating constipation, diarrhea at home.  Since admission and initiation of antibiotics he has had persistent brown, watery stools.  No abdominal pain.  Also since admission has sense of accumulation of phlegm in his throat which he spits up.  Yesterday the material became coffee ground.  He has been receiving DVT prophylaxis with SQ heparin.  Before the coffee-ground emesis ever occurred he has required several PRBCs.,  7 so far this admission, the last one was this morning. Hgb was ranging in the mid eights to 9 range a few days ago but dropped to 6.6 this morning, 6.7 after 1 PRBC.  No melena or bleeding per rectum observed.  Denies use of NSAIDs at home.  Smoker.  Denies alcohol use.  Hypertension, heart failure run in the family.    Past Medical History:  Diagnosis Date   CAD (coronary artery disease)    Cardiomyopathy (HCC)    Diabetic foot ulcer (HCC)    Dyslipidemia    Heart failure (HCC)    Myocardial infarction (HCC)    Type 2 diabetes mellitus (HCC)  Past Surgical History:  Procedure Laterality Date   APPENDECTOMY     APPLICATION OF WOUND VAC Left 03/03/2021   Procedure: APPLICATION OF WOUND VAC;  Surgeon: Tarry Kos, MD;  Location: [facility] OR;  Service: Orthopedics;  Laterality: Left;   APPLICATION OF WOUND VAC  03/07/2021   Procedure: APPLICATION OF WOUND VAC;  Surgeon: [name], MD;  Location: [facility] OR;  Service: Orthopedics;;   CARDIAC CATHETERIZATION     COLON SURGERY     I & D EXTREMITY Left 02/24/2021   Procedure: IRRIGATION AND DEBRIDEMENT EXTREMITY;  Surgeon: Myrene Galas, MD;  Location: [facility] OR;  Service: Orthopedics;  Laterality: Left;   I & D EXTREMITY Left 02/26/2021   Procedure: IRRIGATION AND DEBRIDEMENT OF LEG;  Surgeon: [name], MD;  Location: [facility] OR;  Service: Orthopedics;  Laterality: Left;   I & D EXTREMITY Left 03/03/2021    Procedure: LEFT LEG IRRIGATION AND DEBRIDEMENT;  Surgeon: Tarry Kos, MD;  Location: [facility] OR;  Service: Orthopedics;  Laterality: Left;   I & D EXTREMITY Left 03/07/2021   Procedure: REPEAT DEBRIDEMENT LEFT LEG;  Surgeon: [name], MD;  Location: [facility] OR;  Service: Orthopedics;  Laterality: Left;   IR FLUORO GUIDE CV LINE RIGHT  03/10/2021   IR US GUIDE VASC ACCESS RIGHT  03/10/2021   LEFT HEART CATH AND CORONARY ANGIOGRAPHY N/A 09/13/2020   Procedure: LEFT HEART CATH AND CORONARY ANGIOGRAPHY;  Surgeon: Lennette Bihari, MD;  Location: [facility] INVASIVE CV LAB;  Service: Cardiovascular;  Laterality: N/A;   TOE AMPUTATION  2020    Prior to Admission medications   Medication Sig Start Date End Date Taking? Authorizing Provider  amLODipine (NORVASC) 5 MG tablet Take 1 tablet (5 mg total) by mouth daily. 02/20/21  Yes Baldo Daub, MD  aspirin EC 81 MG tablet Take 1 tablet (81 mg total) by mouth daily. Swallow whole. 02/20/21  Yes Baldo Daub, MD  atorvastatin (LIPITOR) 80 MG tablet Take 1 tablet (80 mg total) by mouth daily. 02/20/21  Yes Baldo Daub, MD  furosemide (LASIX) 40 MG tablet Take 1 tablet (40 mg total) by mouth daily. Take 40 mg every other day alternating with 2 tablets ( ) every other day Patient taking differently: Take 40-80 mg by mouth daily. Take 40 mg every other day alternating with 2 tablets ( ) every other day 02/20/21  Yes Baldo Daub, MD  glyBURIDE (DIABETA) 5 MG tablet Take 5 mg by mouth 2 (two) times daily. 10/12/20  Yes [provider]  irbesartan (AVAPRO) 75 MG tablet Take 1 tablet (75 mg total) by mouth daily. 01/20/21  Yes Baldo Daub, MD  metFORMIN (GLUCOPHAGE) 500 MG tablet Take 500 mg by mouth 2 (two) times daily. 10/12/20  Yes [provider]  metoprolol succinate (TOPROL-XL) 25 MG 24 hr tablet Take 1 tablet (25 mg total) by mouth daily. 01/20/21  Yes Baldo Daub, MD  silver sulfADIAZINE (SILVADENE) 1 % cream Apply 1 application  topically daily.   Yes [provider]    Scheduled Meds:  acetaminophen  1,000 mg Oral Q8H   aspirin EC  81 mg Oral Daily   docusate sodium  100 mg Oral BID   heparin  5,000 Units Subcutaneous Q8H   insulin aspart  0-9 Units Subcutaneous TID WC   insulin aspart  8 Units Subcutaneous TID WC   insulin glargine-yfgn  15 Units Subcutaneous QHS   metoprolol succinate  50 mg Oral  Daily   pantoprazole (PROTONIX) IV  40 mg Intravenous Q12H   scopolamine  1 patch Transdermal Q72H   Infusions:  cefTRIAXone (ROCEPHIN)  IV Stopped (03/10/21 1448)   DAPTOmycin (CUBICIN)  IV 800 mg (03/10/21 2013)   metronidazole     promethazine (PHENERGAN) injection (IM or IVPB) 12.5 mg (03/10/21 2247)   PRN Meds: alum & mag hydroxide-simeth, HYDROmorphone, lidocaine, polyethylene glycol, promethazine (PHENERGAN) injection (IM or IVPB), senna   Allergies as of 02/24/2021 - Review Complete 02/24/2021  Allergen Reaction Noted   Morphine Itching 02/23/2019    Family History  Problem Relation Age of Onset   Hypertension Mother    Heart failure Father    Hypertension Brother    Congestive Heart Failure Brother     Social History   Socioeconomic History   Marital status: Single    Spouse name: Not on file   Number of children: Not on file   Years of education: Not on file   Highest education level: Not on file  Occupational History   Not on file  Tobacco Use   Smoking status: Every Day    Types: Cigarettes   Smokeless tobacco: Never   Tobacco comments:    5-6 daily  Vaping Use   Vaping Use: Never used  Substance and Sexual Activity   Alcohol use: Not Currently    Comment: Very rare   Drug use: Not Currently    Types: Marijuana   Sexual activity: Not on file  Other Topics Concern   Not on file  Social History Narrative   Not on file   Social Determinants of Health   Financial Resource Strain: High Risk   Difficulty of Paying Living Expenses: Very hard  Food Insecurity: No  Food Insecurity   Worried About Programme researcher, broadcasting/film/video in the Last Year: Never true   Barista in the Last Year: Never true  Transportation Needs: Unmet Transportation Needs   Lack of Transportation (Medical): Yes   Lack of Transportation (Non-Medical): Yes  Physical Activity: Not on file  Stress: Not on file  Social Connections: Not on file  Intimate Partner Violence: Not on file    REVIEW OF SYSTEMS: Constitutional: Feels okay.  No profound weakness ENT:  No nose bleeds Pulm: No shortness of breath.  Clear cough. CV:  No palpitations, no LE edema.  GU:  No hematuria, no frequency GI: See HPI. Heme: Denies unusual or excessive bleeding or bruising prior to admission. Transfusions: See HPI. Neuro:  No headaches, no seizures.  No syncope.  Numbness and tingling in his feet. Endocrine:  No sweats or chills.  No polyuria or dysuria Immunization: Not queried   PHYSICAL EXAM: Vital signs in last 24 hours: Vitals:   03/11/21 0830 03/11/21 1105  BP:  117/76  Pulse: (!) 110 (!) 110  Resp: 16 16  Temp: 98.8 F (37.1 C) 99.6 F (37.6 C)  SpO2: 96% 98%   Wt Readings from Last 3 Encounters:  03/11/21 117.4 kg  02/21/21 110.2 kg  02/18/21 109.8 kg    General: Patient looks generally unwell but not acutely ill.  He is alert.  Sitting up in bed, comfortable. Head: No facial asymmetry or swelling.  No signs of head trauma Eyes: No conjunctival pallor.  No scleral icterus. Ears: Not hard of hearing Nose: No congestion or discharge Mouth: No ulcers or blood in the mouth.  Fair dentition.  Tongue midline.  Mucosa moist and pink. Neck: No JVD, no masses,  no thyromegaly, no tenderness. Lungs: Clear bilaterally without labored breathing or cough Heart: RRR.  No MRG.  S1, S2 present Abdomen: Soft without tenderness.  No HSM, masses, bruits..   Rectal: Deferred Musc/Skeltl: No joint redness or swelling observed.  There is a gauze wrap on the right foot ankle and extensive bandaging  on the left lower extremity.  Neurologic: Alert.  Oriented x3.  Moves all 4 limbs.  No tremors.  No gross weakness or deficits. Skin: No rash, no sores. Nodes: No cervical adenopathy. Psych: A bit of a short fuse.  Peeved by having to answer questions.  Does not seem depressed.  Intake/Output from previous day: 08/15 0701 - 08/16 0700 In: 1266 [P.O.:720; Blood:30; IV Piggyback:516] Out: -  Intake/Output this shift: Total I/O In: 606.7 [P.O.:240; Blood:366.7] Out: -   LAB RESULTS: Recent Labs    03/09/21 0355 03/10/21 0334 03/11/21 0353 03/11/21 1109  WBC 14.9* 21.9* 17.2*  --   HGB 9.0* 8.6* 6.6* 6.7*  HCT 26.3* 25.2* 19.7* 20.4*  PLT 321 375 314  --    BMET Lab Results  Component Value Date   NA 135 03/11/2021   NA 138 03/10/2021   NA 136 03/09/2021   K 3.2 (L) 03/11/2021   K 3.5 03/10/2021   K 4.0 03/09/2021   CL 95 (L) 03/11/2021   CL 98 03/10/2021   CL 101 03/09/2021   CO2 32 03/11/2021   CO2 28 03/10/2021   CO2 25 03/09/2021   GLUCOSE 195 (H) 03/11/2021   GLUCOSE 188 (H) 03/10/2021   GLUCOSE 178 (H) 03/09/2021   BUN 36 (H) 03/11/2021   BUN 31 (H) 03/10/2021   BUN 32 (H) 03/09/2021   CREATININE 2.24 (H) 03/11/2021   CREATININE 2.67 (H) 03/10/2021   CREATININE 2.76 (H) 03/09/2021   CALCIUM 7.4 (L) 03/11/2021   CALCIUM 7.6 (L) 03/10/2021   CALCIUM 7.6 (L) 03/09/2021   LFT No results for input(s): PROT, ALBUMIN, AST, ALT, ALKPHOS, BILITOT, BILIDIR, IBILI in the last 72 hours. PT/INR Lab Results  Component Value Date   INR 1.3 (H) 02/21/2021   Hepatitis Panel No results for input(s): HEPBSAG, HCVAB, HEPAIGM, HEPBIGM in the last 72 hours. C-Diff No components found for: CDIFF Lipase     Component Value Date/Time   LIPASE 30 02/24/2021 1708    Drugs of Abuse  No results found for: LABOPIA, COCAINSCRNUR, LABBENZ, AMPHETMU, THCU, LABBARB   RADIOLOGY STUDIES: IR Fluoro Guide CV Line Right  Result Date: 03/10/2021 INDICATION: 50 year old male  with a history of left foot necrotizing fasciitis. He requires durable venous access for long-term IV antibiotic therapy. Given poor renal function, arm vein should be preserved for potential future dialysis access. Therefore, he presents for placement of a tunneled central line. EXAM: IR RIGHT FLUORO GUIDE CV LINE; IR ULTRASOUND GUIDANCE VASC ACCESS RIGHT MEDICATIONS: None. ANESTHESIA/SEDATION: None. FLUOROSCOPY TIME:  Fluoroscopy Time: 0 minutes 18 seconds (4 mGy). COMPLICATIONS: None immediate. PROCEDURE: Informed written consent was obtained from the patient after a thorough discussion of the procedural risks, benefits and alternatives. All questions were addressed. Maximal Sterile Barrier Technique was utilized including caps, mask, sterile gowns, sterile gloves, sterile drape, hand hygiene and skin antiseptic. A timeout was performed prior to the initiation of the procedure. The right internal jugular vein was interrogated with ultrasound and found to be widely patent. An image was obtained and stored for the medical record. Local anesthesia was attained by infiltration with 1% lidocaine. A small dermatotomy was made. Under  real-time sonographic guidance, the vessel was punctured with a 21 gauge micropuncture needle. Using standard technique, the initial micro needle was exchanged over a 0.018 micro wire for a transitional 4 [place] micro sheath. The micro sheath was then exchanged for a peel-away sheath. A suitable skin exit site was selected inferior to the clavicle. Local anesthesia was again attained by infiltration with 1% lidocaine. A small dermatotomy was made. The power line was then tunneled from the skin exit site to the dermatotomy overlying the venous access site. The catheter was cut to length and advanced through the peel-away sheath. The catheter tip was positioned at the cavoatrial junction under fluoroscopy. In image was obtained and stored for the medical record. The catheter was flushed and  capped. The catheter was secured to the skin with 0 Prolene suture. The dermatotomy at the venous access site was sealed with Dermabond. Sterile bandages were applied. IMPRESSION: Successful placement of tunneled right IJ dual lumen power line. Electronically Signed   By: Malachy Moan M.D.   On: 03/10/2021 10:27   IR US Guide Vasc Access Right  Result Date: 03/10/2021 INDICATION: 50 year old male with a history of left foot necrotizing fasciitis. He requires durable venous access for long-term IV antibiotic therapy. Given poor renal function, arm vein should be preserved for potential future dialysis access. Therefore, he presents for placement of a tunneled central line. EXAM: IR RIGHT FLUORO GUIDE CV LINE; IR ULTRASOUND GUIDANCE VASC ACCESS RIGHT MEDICATIONS: None. ANESTHESIA/SEDATION: None. FLUOROSCOPY TIME:  Fluoroscopy Time: 0 minutes 18 seconds (4 mGy). COMPLICATIONS: None immediate. PROCEDURE: Informed written consent was obtained from the patient after a thorough discussion of the procedural risks, benefits and alternatives. All questions were addressed. Maximal Sterile Barrier Technique was utilized including caps, mask, sterile gowns, sterile gloves, sterile drape, hand hygiene and skin antiseptic. A timeout was performed prior to the initiation of the procedure. The right internal jugular vein was interrogated with ultrasound and found to be widely patent. An image was obtained and stored for the medical record. Local anesthesia was attained by infiltration with 1% lidocaine. A small dermatotomy was made. Under real-time sonographic guidance, the vessel was punctured with a 21 gauge micropuncture needle. Using standard technique, the initial micro needle was exchanged over a 0.018 micro wire for a transitional 4 [place] micro sheath. The micro sheath was then exchanged for a peel-away sheath. A suitable skin exit site was selected inferior to the clavicle. Local anesthesia was again attained by  infiltration with 1% lidocaine. A small dermatotomy was made. The power line was then tunneled from the skin exit site to the dermatotomy overlying the venous access site. The catheter was cut to length and advanced through the peel-away sheath. The catheter tip was positioned at the cavoatrial junction under fluoroscopy. In image was obtained and stored for the medical record. The catheter was flushed and capped. The catheter was secured to the skin with 0 Prolene suture. The dermatotomy at the venous access site was sealed with Dermabond. Sterile bandages were applied. IMPRESSION: Successful placement of tunneled right IJ dual lumen power line. Electronically Signed   By: Malachy Moan M.D.   On: 03/10/2021 10:27     IMPRESSION:   Coffee ground emesis.  Previous history of CT diagnosis esophagitis, duodenitis 01/2019.  Not on PPI or H2 blocker at home.  Only just initiated on Protonix 40 mg IV bid as of 830 this morning     Anemia.  Status post a total of 6 PRBCs  AKI, improving.  Baseline renal function not established     Multivessel CAD with ischemic cardiomyopathy.  EF 30 to 35%.  Plans for cardiac bypass on hold due to development of necrotizing fasciitis  LLE ulcer and necrotizing fasciitis.  Blood culture PCR positive for MRSA.  Status post multiple I&D's, wound VAC placement.     DM2.  Hemoglobin A1c 8.8.  Hypoalbuminemia.  Albumin 1.915 days ago.  Otherwise normal LFTs.  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  Details unknown but may have been a volvulus versus obstruction due to adhesion.  Extent of bowel surgery or whether this involved large or small intestine unknown.    PLAN:       Egd tmrw.  Has been drinking liquids today.  Patient agreeable to proceed   Jennye Moccasin  03/11/2021, 1:17 PM Phone [phone]    Attending Physician's Attestation   I have taken an interval history, reviewed the chart and examined the patient.   50 y/o m with poorly  controlled DM and CAD/CHF hospitalized with necrotizing fasciitis of LLE, with 2 days of coughing up bloody phlegm.  No outright emesis per patient.  No abdominal pain.  No overt GI bleeding (melena, hematochezia).  Hgb dropped abruptl, suspicious for upper GI bleed.  Start PPI, NPO AM, EGD tomorrow   I agree with the Advanced Practitioner's note, impression, and recommendations with updates and my documentation above.   Tiajuana Amass, MD [facility]

## 2021-03-11 NOTE — Progress Notes (Signed)
HD#16 SUBJECTIVE:  Patient Summary: Adam James is a 50 y.o. with a pertinent PMH of diabetes, ischemic heart disease, chronic heart failure with reduced EF, and CKD IIIa who presented s/p irrigation and debridement for left foot diabetic infection with gas gangrene and necrotizing fasciitis  Overnight Events: Hb 6.6, 1 unit PRBC given  Interim History: This is hospital day 68 for Adam James who was seen and evaluated at the bedside this morning. His nausea is improved overall, however, he reports that he has been coughing up sputum that is bright red and also coffee colored in nature. This started in the morning and he said it feels "foamy" in his throat. He denies any melena, hematochezia, or hematuria at this time. He also continues to deny any chest pain, palpitations, or shortness of breath. He was seen wearing the cam boot on his left lower extremity and notes that he still has pain with ambulating, but this has helped him to bear weight on the extremity.   OBJECTIVE:  Vital Signs: Vitals:   03/10/21 1655 03/10/21 1955 03/11/21 0351 03/11/21 0429  BP: 125/81 124/78 127/76   Pulse:  (!) 103 (!) 108   Resp:   16   Temp: 98.9 F (37.2 C) 97.9 F (36.6 C) 98.7 F (37.1 C)   TempSrc: Oral Oral Oral   SpO2:   97%   Weight:    117.4 kg  Height:       Supplemental O2: Room Air SpO2: 97 % O2 Flow Rate (L/min): 97 L/min FiO2 (%): 21 %  Filed Weights   03/09/21 0059 03/10/21 0523 03/11/21 0429  Weight: 110.9 kg 114.4 kg 117.4 kg     Intake/Output Summary (Last 24 hours) at 03/11/2021 0544 Last data filed at 03/11/2021 0100 Gross per 24 hour  Intake 1236 ml  Output --  Net 1236 ml   Net IO Since Admission: 9,826.33 mL [03/11/21 0544]  Physical Exam: Constitutional: well-appearing middle-aged male resting in bed , in no acute distress Cardiovascular: regular rate and rhythm, S1 and S2 present, no m/r/g Pulmonary/Chest: normal work of breathing on room air, lungs clear  to auscultation bilaterally MSK: normal bulk and tone in RLE with palpable radial and dorsalis pedis pulses on R 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 No obvious rash or lesions; sacral edema present  Psych: Normal mood and affect     ASSESSMENT/PLAN:  Assessment: Principal Problem:   Gangrene of left foot (HCC) Active Problems:   Cardiomyopathy (HCC)   Type 2 diabetes mellitus (HCC)   Diabetic foot ulcer (HCC)   CAD (coronary artery disease)   Necrotizing fasciitis (HCC)   Left leg cellulitis   Diabetic polyneuropathy associated with type 2 diabetes mellitus (HCC)   Severe protein-calorie malnutrition (HCC)   Plan: #LLE necrotizing fascitis s/p multiple I&D's Patient is s/p I&D on 8/3, 8/8 and 8/12 for attempted limb salvage. Patient has wound vac in place and continues to deny any pain at this time. He is now using a cam boot which has helped him to bear weight on the left extremity. He remains on IV daptomycin, rocephin and oral flagyl and had his tunneled IJ placed on 8/15 for long term antibiotics. Patient remains afebrile and his white blood cell count is improved to 17 today. Encouraged to participate in PT/OT for discharge planning.  - ID consulted, appreciate recommendations  -  IV daptomycin and rocephin, PO flagyl 500mg  BID to IV today - Orthopedic surgery consulted, appreciate recommendations - Monitor wound vac output  - Encourage to participate in PT/OT    #Acute kidney injury on chronic kidney disease IIIa likely 2/2 acute tubular necrosis  Initially noted to have worsening renal function in setting multiple I&D's. sCr improved to 2.24 today and GFR now >30. Continuing to hold off on IV fluids due to hypervolemia in setting of HFrEF.  - Continue to monitor renal function - Avoid nephrotoxic agents - Supportive care and encourage PO intake  - If GFR  remains >30, could adjust DVT prophylaxis back to lovenox or oral xarelto   #Coffee ground emesis  Patient started coughing up coffee ground sputum with some bright red blood noted this morning. This is likely secondary to a cushing ulcer secondary to stress on his body.  - Started on Protonix 40mg  IV BID - Will consider GI consultation if symptoms do not improve/resolve   #Acute on chronic normocytic anemia likely 2/2 acute blood loss #Hx of ischemic heart disease and HFrEF  Hb decreased to 6.6 this morning, 1 unit of PRBC given. He continues to deny any chest pain, palpitations or dyspnea at this time. Hb goal remains >8 in the setting of ischemic heart disease - Follow up repeat H&H - Transfuse if Hb <8 - Metoprolol 50mg  daily    #Type II DM with diabetic neuropathy CBG's slightly elevated today, 170s-190s on current regimen. Goal for glucose levels <180. - Continue Semglee 15U + Novolog 8U + SSI with meals   Best Practice: Diet: carb modified IVF: Fluids: none VTE: heparin injection 5,000 Units Start: 03/10/21 1400 SCDs Start: 03/07/21 1754 SCDs Start: 02/26/21 1733 SCDs Start: 02/25/21 0132 Code: Full AB: Daptomycin, ceftriaxone, flagyl Therapy Recs: SNF vs home health DISPO: Anticipated discharge to Skilled nursing facility pending Medical stability.  Signature: 05/07/21, D.O.  Internal Medicine Resident, PGY-1 04/28/21 Internal Medicine Residency  Pager: [phone] 5:44 AM, 03/11/2021   Please contact the on call pager after 5 pm and on weekends at [phone].

## 2021-03-11 NOTE — Progress Notes (Signed)
Hgb 6.6 called into Resident team

## 2021-03-11 NOTE — Progress Notes (Signed)
OT Cancellation Note  Patient Details Name: Adam James MRN: [id number] DOB: Jun 15, 1971   Cancelled Treatment:    Reason Eval/Treat Not Completed: Medical issues which prohibited therapy. Per chart review and conversation with pt's nurse, pt is having a rough morning. Recently had episodes of emesis with blood noted. Pt is now s/p 1 unit PRBC with HGB recheck of 6.7. Will hold therapy today and reattempt at a later date once medically appropriate.   Raynald Kemp, OT Acute Rehabilitation Services Pager: [phone] Office: [phone]  03/11/2021, 12:41 PM

## 2021-03-12 ENCOUNTER — Inpatient Hospital Stay (HOSPITAL_COMMUNITY): Payer: Self-pay | Admitting: Anesthesiology

## 2021-03-12 ENCOUNTER — Encounter (HOSPITAL_COMMUNITY): Payer: Self-pay | Admitting: Student in an Organized Health Care Education/Training Program

## 2021-03-12 ENCOUNTER — Encounter (HOSPITAL_COMMUNITY)
Admission: EM | Disposition: A | Discharge status: Home Health | Payer: Self-pay | Source: Home / Self Care | Attending: Student in an Organized Health Care Education/Training Program

## 2021-03-12 DIAGNOSIS — K209 Esophagitis, unspecified without bleeding: Secondary | ICD-10-CM

## 2021-03-12 DIAGNOSIS — K2100 Gastro-esophageal reflux disease with esophagitis, without bleeding: Secondary | ICD-10-CM

## 2021-03-12 DIAGNOSIS — K2101 Gastro-esophageal reflux disease with esophagitis, with bleeding: Secondary | ICD-10-CM

## 2021-03-12 DIAGNOSIS — D62 Acute posthemorrhagic anemia: Secondary | ICD-10-CM

## 2021-03-12 HISTORY — PX: ESOPHAGOGASTRODUODENOSCOPY (EGD) WITH PROPOFOL: SHX5813

## 2021-03-12 LAB — PREPARE PLATELET PHERESIS: Unit division: 0

## 2021-03-12 LAB — GLUCOSE, CAPILLARY
Glucose-Capillary: 176 mg/dL — ABNORMAL HIGH (ref 70–99)
Glucose-Capillary: 151 mg/dL — ABNORMAL HIGH (ref 70–99)
Glucose-Capillary: 162 mg/dL — ABNORMAL HIGH (ref 70–99)
Glucose-Capillary: 170 mg/dL — ABNORMAL HIGH (ref 70–99)
Glucose-Capillary: 148 mg/dL — ABNORMAL HIGH (ref 70–99)

## 2021-03-12 LAB — CBC WITH DIFFERENTIAL/PLATELET
Abs Immature Granulocytes: 0.07 10*3/uL (ref 0.00–0.07)
Basophils Absolute: 0.1 10*3/uL (ref 0.0–0.1)
Basophils Relative: 1 %
Eosinophils Absolute: 0.3 10*3/uL (ref 0.0–0.5)
Eosinophils Relative: 2 %
HCT: 22.7 % — ABNORMAL LOW (ref 39.0–52.0)
Hemoglobin: 7.7 g/dL — ABNORMAL LOW (ref 13.0–17.0)
Immature Granulocytes: 1 %
Lymphocytes Relative: 20 %
Lymphs Abs: 3 10*3/uL (ref 0.7–4.0)
MCH: 29.4 pg (ref 26.0–34.0)
MCHC: 33.9 g/dL (ref 30.0–36.0)
MCV: 86.6 fL (ref 80.0–100.0)
Monocytes Absolute: 0.5 10*3/uL (ref 0.1–1.0)
Monocytes Relative: 4 %
Neutro Abs: 10.6 10*3/uL — ABNORMAL HIGH (ref 1.7–7.7)
Neutrophils Relative %: 72 %
Platelets: 298 10*3/uL (ref 150–400)
RBC: 2.62 MIL/uL — ABNORMAL LOW (ref 4.22–5.81)
RDW: 15.5 % (ref 11.5–15.5)
WBC: 14.5 10*3/uL — ABNORMAL HIGH (ref 4.0–10.5)
nRBC: 0 % (ref 0.0–0.2)

## 2021-03-12 LAB — BASIC METABOLIC PANEL
Anion gap: 7 (ref 5–15)
BUN: 39 mg/dL — ABNORMAL HIGH (ref 6–20)
CO2: 30 mmol/L (ref 22–32)
Calcium: 7.1 mg/dL — ABNORMAL LOW (ref 8.9–10.3)
Chloride: 98 mmol/L (ref 98–111)
Creatinine, Ser: 1.9 mg/dL — ABNORMAL HIGH (ref 0.61–1.24)
GFR, Estimated: 43 mL/min — ABNORMAL LOW (ref >60)
Glucose, Bld: 181 mg/dL — ABNORMAL HIGH (ref 70–99)
Potassium: 3.2 mmol/L — ABNORMAL LOW (ref 3.5–5.1)
Sodium: 135 mmol/L (ref 135–145)

## 2021-03-12 LAB — CBC
HCT: 22.3 % — ABNORMAL LOW (ref 39.0–52.0)
Hemoglobin: 7.5 g/dL — ABNORMAL LOW (ref 13.0–17.0)
MCH: 29.3 pg (ref 26.0–34.0)
MCHC: 33.6 g/dL (ref 30.0–36.0)
MCV: 87.1 fL (ref 80.0–100.0)
Platelets: 301 10*3/uL (ref 150–400)
RBC: 2.56 MIL/uL — ABNORMAL LOW (ref 4.22–5.81)
RDW: 15.6 % — ABNORMAL HIGH (ref 11.5–15.5)
WBC: 13.6 10*3/uL — ABNORMAL HIGH (ref 4.0–10.5)
nRBC: 0 % (ref 0.0–0.2)

## 2021-03-12 LAB — BPAM PLATELET PHERESIS
Blood Product Expiration Date: 202208182359
ISSUE DATE / TIME: 202208161418
Unit Type and Rh: 6200

## 2021-03-12 SURGERY — ESOPHAGOGASTRODUODENOSCOPY (EGD) WITH PROPOFOL
Anesthesia: Monitor Anesthesia Care

## 2021-03-12 MED ORDER — LACTATED RINGERS IV SOLN: INTRAVENOUS | Status: DC | PRN

## 2021-03-12 MED ORDER — SUCRALFATE 1 GM/10ML PO SUSP
1.0000 g | Freq: Three times a day (TID) | ORAL | Status: DC
  Administered 2021-03-12 – 2021-03-14 (×9): 1 g via ORAL
  Filled 2021-03-12 (×10): qty 10

## 2021-03-12 MED ORDER — SODIUM CHLORIDE 0.9 % IV SOLN: INTRAVENOUS | Status: DC

## 2021-03-12 MED ORDER — POTASSIUM CHLORIDE 10 MEQ/100ML IV SOLN
10.0000 meq | INTRAVENOUS | Status: AC
  Administered 2021-03-12 (×2): 10 meq via INTRAVENOUS
  Filled 2021-03-12 (×3): qty 100

## 2021-03-12 MED ORDER — CALCIUM GLUCONATE-NACL 1-0.675 GM/50ML-% IV SOLN
1.0000 g | Freq: Once | INTRAVENOUS | Status: AC
  Administered 2021-03-12: 1000 mg via INTRAVENOUS
  Filled 2021-03-12: qty 50

## 2021-03-12 MED ORDER — SODIUM CHLORIDE 0.9 % IV SOLN: Freq: Once | INTRAVENOUS | Status: DC

## 2021-03-12 MED ORDER — PROPOFOL 500 MG/50ML IV EMUL
INTRAVENOUS | Status: DC | PRN
  Administered 2021-03-12: 150 ug/kg/min via INTRAVENOUS

## 2021-03-12 MED ORDER — SUCRALFATE 1 GM/10ML PO SUSP: 1.0000 g | Freq: Three times a day (TID) | ORAL | Status: DC

## 2021-03-12 MED ORDER — PROPOFOL 10 MG/ML IV BOLUS
INTRAVENOUS | Status: DC | PRN
  Administered 2021-03-12: 40 mg via INTRAVENOUS

## 2021-03-12 MED ORDER — PANTOPRAZOLE SODIUM 40 MG PO TBEC
40.0000 mg | DELAYED_RELEASE_TABLET | Freq: Two times a day (BID) | ORAL | Status: DC
  Administered 2021-03-12 – 2021-03-15 (×6): 40 mg via ORAL
  Filled 2021-03-12 (×6): qty 1

## 2021-03-12 NOTE — Transfer of Care (Signed)
Immediate Anesthesia Transfer of Care Note  Patient: Adam James  Procedure(s) Performed: ESOPHAGOGASTRODUODENOSCOPY (EGD) WITH PROPOFOL  Patient Location: PACU  Anesthesia Type:MAC  Level of Consciousness: drowsy  Airway & Oxygen Therapy: Patient Spontanous Breathing and Patient connected to nasal cannula oxygen  Post-op Assessment: Report given to RN and Post -op Vital signs reviewed and stable  Post vital signs: Reviewed and stable  Last Vitals:  Vitals Value Taken Time  BP 91/51 03/12/21 1119  Temp    Pulse 91 03/12/21 1120  Resp 19 03/12/21 1120  SpO2 100 % 03/12/21 1120  Vitals shown include unvalidated device data.  Last Pain:  Vitals:   03/12/21 1001  TempSrc: Oral  PainSc: 0-No pain      Patients Stated Pain Goal: 0 (51/83/43 7357)  Complications: No notable events documented.

## 2021-03-12 NOTE — Anesthesia Postprocedure Evaluation (Signed)
Anesthesia Post Note  Patient: Adam James  Procedure(s) Performed: ESOPHAGOGASTRODUODENOSCOPY (EGD) WITH PROPOFOL     Patient location during evaluation: PACU Anesthesia Type: MAC Level of consciousness: awake and alert Pain management: pain level controlled Vital Signs Assessment: post-procedure vital signs reviewed and stable Respiratory status: spontaneous breathing, nonlabored ventilation, respiratory function stable and patient connected to nasal cannula oxygen Cardiovascular status: stable and blood pressure returned to baseline Postop Assessment: no apparent nausea or vomiting Anesthetic complications: no   No notable events documented.  Last Vitals:  Vitals:   03/12/21 1119 03/12/21 1138  BP: (!) 91/51 117/71  Pulse: 90 97  Resp: 16 17  Temp: (!) 36.4 C   SpO2: 100% 96%    Last Pain:  Vitals:   03/12/21 1119  TempSrc: Temporal  PainSc: 0-No pain                 Chimamanda Siegfried

## 2021-03-12 NOTE — Progress Notes (Signed)
Occupational Therapy Treatment Patient Details Name: Adam James MRN: [id number] DOB: November 22, 1970 Today's Date: 03/12/2021    History of present illness Pt is a 50 y.o. male admitted 02/24/21 with bilateral foot wounds. Pt with L foot diabetic infection with gas gangrene, R foot diabetic ulcer. Ortho workup reports LLE diabetic insensate neuropathy with necrotizing fasciitis involving the L anterior/lateral compartments and dorsum of L foot. S/p L foot I&D 8/1. S/p additional L foot and lower leg I&D with wound vac placement 8/3. Plan for LLE limb salvage with serial debridements next week. PMH includes DM2, neuropathy, CAD, cardiomyopathy.   OT comments  Pt pleasant and participatory with OT. Pt able to demo bed mobility and UB ADLs with Modified Independence. Pt able to successfully stand and mobilize in room using RW (and L LE cam boot) without an increase in pain levels at Supervision level. Discussed home setup and DME needs again with pt desire to be independent with bathroom mobility and toileting tasks - plan to assess in next session. Discussed stair mgmt with PT in next sessions. Anticipate good progress with no OT follow-up needed.    Follow Up Recommendations  No OT follow up    Equipment Recommendations  3 in 1 bedside commode;Other (comment) (Rolling walker)    Recommendations for Other Services      Precautions / Restrictions Precautions Precautions: Fall;Other (comment) Precaution Comments: LLE wound vac Required Braces or Orthoses: Other Brace Other Brace: cam boot to L foot Restrictions Weight Bearing Restrictions: Yes RLE Weight Bearing: Weight bearing as tolerated LLE Weight Bearing: Weight bearing as tolerated Other Position/Activity Restrictions: WBAT with L LE cam boot       Mobility Bed Mobility Overal bed mobility: Modified Independent                  Transfers Overall transfer level: Needs assistance Equipment used: None Transfers: Sit  to/from Stand Sit to Stand: Supervision         General transfer comment: sit to stand at bedside without AD supervision. use of RW for dynamic tasks    Balance Overall balance assessment: Needs assistance Sitting-balance support: Feet supported Sitting balance-[name] Scale: Good     Standing balance support: Bilateral upper extremity supported;No upper extremity supported;During functional activity Standing balance-[name] Scale: Fair Standing balance comment: fair static standing without AD at bedside, use of RW for mobility to offload pressure from L LE                           ADL either performed or assessed with clinical judgement   ADL Overall ADL's : Needs assistance/impaired                 Upper Body Dressing : Independent;Sitting Upper Body Dressing Details (indicate cue type and reason): to don gown around back                 Functional mobility during ADLs: Supervision/safety;Rolling walker;Cueing for sequencing;Cueing for safety General ADL Comments: Pt able to demo mobility in room using RW with no physical assist. Discussed bathroom setup w pt planning to complete bird baths until allowed to shower. Discussed BSC over toilet if regular toilet difficult to get off of when practiced next session     Vision   Vision Assessment?: No apparent visual deficits   Perception     Praxis      Cognition Arousal/Alertness: Awake/alert Behavior During Therapy: WFL for tasks assessed/performed;Impulsive Overall Cognitive  Status: No family/caregiver present to determine baseline cognitive functioning                                 General Comments: impulsive with movement in regards to lines, much more pleasant and participatory        Exercises     Shoulder Instructions       General Comments      Pertinent Vitals/ Pain       Pain Assessment: No/denies pain Pain Location: no L LE pain with mobility, did have one sharp  shooting pain up L LE when sitting back on bed that subsided Pain Intervention(s): Monitored during session  Home Living                                          Prior Functioning/Environment              Frequency  Min 1X/week        Progress Toward Goals  OT Goals(current goals can now be found in the care plan section)  Progress towards OT goals: Progressing toward goals  Acute Rehab OT Goals Patient Stated Goal: to get home and hurt less OT Goal Formulation: With patient Time For Goal Achievement: 03/13/21 Potential to Achieve Goals: Good ADL Goals Pt Will Perform Lower Body Bathing: with supervision;sitting/lateral leans Pt Will Perform Lower Body Dressing: with supervision;sitting/lateral leans Pt Will Transfer to Toilet: with min guard assist;squat pivot transfer;stand pivot transfer;bedside commode Additional ADL Goal #1: Pt to demonstrate full sit to stand transfer with no more than Mod A in prep for ADL transfers  Plan Discharge plan remains appropriate    Co-evaluation                 AM-PAC OT "6 Clicks" Daily Activity     Outcome Measure   Help from another person eating meals?: None Help from another person taking care of personal grooming?: None Help from another person toileting, which includes using toliet, bedpan, or urinal?: A Little Help from another person bathing (including washing, rinsing, drying)?: A Little Help from another person to put on and taking off regular upper body clothing?: None Help from another person to put on and taking off regular lower body clothing?: A Little 6 Click Score: 21    End of Session Equipment Utilized During Treatment: Rolling walker  OT Visit Diagnosis: Unsteadiness on feet (R26.81);Other abnormalities of gait and mobility (R26.89);Pain Pain - Right/Left: Left Pain - part of body: Ankle and joints of foot   Activity Tolerance Patient tolerated treatment well   Patient Left in  bed;with call bell/phone within reach   Nurse Communication Mobility status        Time: 5638-7564 OT Time Calculation (min): 15 min  Charges: OT General Charges $OT Visit: 1 Visit OT Treatments $Therapeutic Activity: 8-22 mins  Bradd Canary, OTR/L Acute Rehab Services Office: [phone]    Lorre Munroe 03/12/2021, 2:15 PM

## 2021-03-12 NOTE — Progress Notes (Signed)
HD#17 SUBJECTIVE:  Patient Summary: Adam James is a 50 y.o. with a pertinent PMH of diabetes, ischemic heart disease, chronic heart failure with reduced EF, and CKD IIIa who presented s/p irrigation and debridement for left foot diabetic infection with gas gangrene and necrotizing fasciitis  Overnight Events: Repeat Hb 6.9, another unit of blood given overnight  Interim History: This is hospital day 72 for [name] who was seen and evaluated at the bedside this afternoon. Unable to evaluate patient in the morning as he was getting his egd. He states that he is no longer coughing up any blood. Denies any melena, hematochezia, or hematuria. He denies any chest pain, palpitations, or shortness of breath. His left lower extremity pain remains well controlled. He is able to bear weight on the left extremity with the cam boot, but only to the bedside commode.   OBJECTIVE:  Vital Signs: Vitals:   03/12/21 0114 03/12/21 0116 03/12/21 0352 03/12/21 0600  BP: 125/85 125/85 133/75   Pulse: 100 (!) 101 100   Resp: 16 16 16    Temp: 98.4 F (36.9 C) 98.4 F (36.9 C) 98.8 F (37.1 C)   TempSrc: Oral Oral Oral   SpO2: 95% 95% 95%   Weight:    117.6 kg  Height:       Supplemental O2: Room Air SpO2: 97%  Filed Weights   03/10/21 0523 03/11/21 0429 03/12/21 0600  Weight: 114.4 kg 117.4 kg 117.6 kg     Intake/Output Summary (Last 24 hours) at 03/12/2021 0641 Last data filed at 03/12/2021 0353 Gross per 24 hour  Intake 2198.84 ml  Output --  Net 2198.84 ml   Net IO Since Admission: 12,055.17 mL [03/12/21 0641]  Physical Exam: Constitutional: well-appearing middle-aged male resting in bed , in no acute distress Cardiovascular: regular rate and rhythm, S1 and S2 present, no m/r/g Pulmonary/Chest: normal work of breathing on room air, lungs clear to auscultation bilaterally MSK: normal bulk and tone in RLE with palpable radial and dorsalis pedis pulses on R s/p RLE 5th MTP  amputation; has R foot in dressing; LLE with wound vac (~350cc blood tinged output) and post-op dressing in place with cam boot Skin: warm and dry. No obvious rash or lesions; sacral edema present  Psych: Normal mood and affect     ASSESSMENT/PLAN:  Assessment: Principal Problem:   Gangrene of left foot (HCC) Active Problems:   Cardiomyopathy (HCC)   Type 2 diabetes mellitus (HCC)   Diabetic foot ulcer (HCC)   CAD (coronary artery disease)   Necrotizing fasciitis (HCC)   Left leg cellulitis   Diabetic polyneuropathy associated with type 2 diabetes mellitus (HCC)   Severe protein-calorie malnutrition (HCC)   Gastrointestinal hemorrhage   Plan: #LLE necrotizing fascitis s/p multiple I&D's Patient is s/p I&D on 8/3, 8/8 and 8/12 for attempted limb salvage. Patient has wound vac in place and continues to deny any pain at this time. He is now using a cam boot which has helped him to bear weight on the left extremity. He remains on IV daptomycin, rocephin and flagyl and had his tunneled IJ placed on 8/15 for long term antibiotics. Patient remains afebrile and his white blood cell count is improved to 13 today. Encouraged to participate in PT/OT for discharge planning.  - ID consulted, appreciate recommendations  - IV daptomycin, rocephin, and flagyl IV   - May transition back to unasyn in future? - Orthopedic surgery consulted, appreciate recommendations - Monitor wound vac output  -  Encourage to participate in PT/OT   #Severe erosive esophagitis with ulceration Patient started coughing up coffee ground sputum with some bright red blood noted on 8/17. Hb 6.9 overnight and he was given another unit of PRBC, repeat this morning 7.7. He is now s/p 9 total units of blood this admission and 1 unit of platelets. EGD this morning showed severe erosive esophagitis with ulceration, with no active bleeding noted but a large clot was noted on the esophagus. Stomach and duodenum appeared normal. - 40  mg protonix BID and carafate x2 weeks - Full liquid diet, consider advancing tomorrow - GI consulted, appreciate recommendations  #Acute kidney injury on chronic kidney disease IIIa likely 2/2 acute tubular necrosis  Initially noted to have worsening renal function in setting multiple I&D's. sCr improved to 1.9 today and GFR remains >30. Despite improving creatinine, BUN has continued to rise likely in the setting of old GI bleeding. Continuing to hold off on IV fluids due to hypervolemia in setting of HFrEF.  - Continue to monitor renal function - Avoid nephrotoxic agents - Supportive care and encourage PO intake  - If GFR remains >30, could adjust DVT prophylaxis back to lovenox or oral xarelto    #Acute on chronic normocytic anemia likely 2/2 acute blood loss #Hx of ischemic heart disease and HFrEF   He continues to deny any chest pain, palpitations or dyspnea at this time. Hb goal remains >8 in the setting of ischemic heart disease. Holding off on transfusing for Hb 7.7 at this time as patient has received 9 total units of blood this admission.  - Transfuse if Hb <8 - Metoprolol 50mg  daily    #Type II DM with diabetic neuropathy CBG's slightly elevated today, 110s-170s on current regimen. Goal for glucose levels <180. - Continue Semglee 15U + Novolog 8U + SSI with meals   Best Practice: Diet: carb modified IVF: none VTE: heparin injection 5,000 Units Start: 03/10/21 1400 SCDs Start: 03/07/21 1754 SCDs Start: 02/26/21 1733 SCDs Start: 02/25/21 0132 Code: Full AB: Daptomycin, ceftriaxone, flagyl Therapy Recs: SNF vs home health DISPO: Anticipated discharge to Skilled nursing facility pending Medical stability.   Signature: 04/27/21, D.O.  Internal Medicine Resident, PGY-1 [facility]  Pager: [phone] 6:41 AM, 03/12/2021   Please contact the on call pager after 5 pm and on weekends at [phone].

## 2021-03-12 NOTE — Progress Notes (Signed)
OT Cancellation Note  Patient Details Name: Adam James MRN: [id number] DOB: Jul 01, 1971   Cancelled Treatment:    Reason Eval/Treat Not Completed: Patient at procedure or test/ unavailable Pt off unit for EGD this AM. Will follow-up for OT session as time allows.  Lorre Munroe 03/12/2021, 9:59 AM

## 2021-03-12 NOTE — Progress Notes (Signed)
Patient is status post irrigation and debridement 5 days ago to his leg for necrotizing fasciitis.  He is lying in bed alert and awake.  Wound VAC is in place functioning well with 2 green checks.  He does have approximately 200 cc bloody drainage in the canister  Patient is scheduled for endoscopy today to evaluate  GI bleed.  Hemoglobin this morning is 7.5

## 2021-03-12 NOTE — Interval H&P Note (Signed)
History and Physical Interval Note:  No changes since I saw patient yesterday.  He had a lot of phlegm last night, but it was nonbloody.  Loose stool last night, not melenic.  Plan for EGD today to assess for source of upper GI bleed.  03/12/2021 10:03 AM  Adam James  has presented today for surgery, with the diagnosis of Coffee-ground emesis.  Acute on chronic anemia requiring additional PRBCs..  The various methods of treatment have been discussed with the patient and family. After consideration of risks, benefits and other options for treatment, the patient has consented to  Procedure(s): ESOPHAGOGASTRODUODENOSCOPY (EGD) WITH PROPOFOL (N/A) as a surgical intervention.  The patient's history has been reviewed, patient examined, no change in status, stable for surgery.  I have reviewed the patient's chart and labs.  Questions were answered to the patient's satisfaction.     Jenel Lucks

## 2021-03-12 NOTE — TOC Progression Note (Addendum)
Transition of Care [facility]) - Progression Note    Patient Details  Name: Adam James MRN: [id number] Date of Birth: 1971/06/06  [facility] CM/SW Contact  Leone Haven, RN Phone Number: 03/12/2021, 2:40 PM  Clinical Narrative:    NCM just spoke with patient, he states there will be no one with him while daughter is working, he states the safest discharge plan is for him to go to a SNF. NCM informed CSW of this information.  1650- per physical therapist, states patient is walking to good to go to a SNF, so she is rec HHPT.  He will need a w/chair and a rolling walker also.  NCM made referral to [facility] with [facility] for [facility], HHPT for charity.  NCM will make referral to [employer] for [facility] for DME tomorrow.  Awaiting for call back from [place] with [facility].        Expected Discharge Plan and Services                                                 Social Determinants of Health (SDOH) Interventions    Readmission Risk Interventions No flowsheet data found.

## 2021-03-12 NOTE — Anesthesia Preprocedure Evaluation (Addendum)
Anesthesia Evaluation  Patient identified by MRN, date of birth, ID band Patient awake    Reviewed: Allergy & Precautions, NPO status , Patient's Chart, lab work & pertinent test results, reviewed documented beta blocker date and time   History of Anesthesia Complications Negative for: history of anesthetic complications  Airway Mallampati: II  TM Distance: >3 FB Neck ROM: Full    Dental  (+) Loose, Poor Dentition,    Pulmonary Current Smoker,    Pulmonary exam normal        Cardiovascular hypertension, Pt. on medications and Pt. on home beta blockers + angina with exertion + CAD and + Past MI  Normal cardiovascular exam  TTE 02/20/21: EF 30-35%, anterior, anteroseptal and inferoseptal hypokinesis, mild LVH, grade I DD, borderline dilatation of the aortic root and of the ascending aorta measuring 36 mm.    Neuro/Psych negative neurological ROS  negative psych ROS   GI/Hepatic negative GI ROS, Neg liver ROS,   Endo/Other  diabetes, Type 2, Oral Hypoglycemic Agents  Renal/GU negative Renal ROS  negative genitourinary   Musculoskeletal negative musculoskeletal ROS (+)   Abdominal   Peds  Hematology  (+) Blood dyscrasia, anemia , Hgb 7.9   Anesthesia Other Findings   Reproductive/Obstetrics negative OB ROS                             Anesthesia Physical  Anesthesia Plan  ASA: 3  Anesthesia Plan: MAC   Post-op Pain Management:    Induction: Intravenous  PONV Risk Score and Plan: 2 and Treatment may vary due to age or medical condition, Ondansetron and Midazolam  Airway Management Planned: Nasal Cannula and Natural Airway  Additional Equipment: None  Intra-op Plan:   Post-operative Plan:   Informed Consent: I have reviewed the patients History and Physical, chart, labs and discussed the procedure including the risks, benefits and alternatives for the proposed anesthesia with the  patient or authorized representative who has indicated his/her understanding and acceptance.     Dental advisory given  Plan Discussed with: CRNA and Anesthesiologist  Anesthesia Plan Comments:        Anesthesia Quick Evaluation

## 2021-03-12 NOTE — Progress Notes (Signed)
Physical Therapy Treatment Patient Details Name: Adam James MRN: [id number] DOB: November 08, 1970 Today's Date: 03/12/2021    History of Present Illness Pt is a 50 y.o. male admitted 02/24/21 with bilateral foot wounds. Pt with L foot diabetic infection with gas gangrene, R foot diabetic ulcer. Ortho workup reports LLE diabetic insensate neuropathy with necrotizing fasciitis involving the L anterior/lateral compartments and dorsum of L foot. S/p L foot I&D 8/1. S/p additional L foot and lower leg I&D with wound vac placement 8/3, 8/8, 8/12. Pt developed bloody sputum; s/p EGD 8/17. PMH includes DM2, neuropathy, CAD, cardiomyopathy.   PT Comments    Pt progressing with mobility. Tolerated transfer and short gait training with RW, supervision for safety/lines. Reviewed educ re: precautions, LE positioning (especially importance of L ankle DF), activity recommendations, importance of mobility. Increased time discussing d/c recommendations - pt agreeable for return home, hopeful for charity [facility] services and DME. Will plan for stair training next session.    Follow Up Recommendations  Home health PT;Supervision - Intermittent     Equipment Recommendations  Rolling walker with 5" wheels;Wheelchair;Wheelchair cushion;3in1 (PT)    Recommendations for Other Services       Precautions / Restrictions Precautions Precautions: Fall;Other (comment) Precaution Comments: LLE wound vac Required Braces or Orthoses: Other Brace Other Brace: CAM walker boot - seems appropriate for L foot, but order for R foot - will clarify before next session Restrictions Weight Bearing Restrictions: Yes RLE Weight Bearing: Weight bearing as tolerated LLE Weight Bearing: Weight bearing as tolerated Other Position/Activity Restrictions: WBAT with L LE cam boot    Mobility  Bed Mobility Overal bed mobility: Modified Independent                  Transfers Overall transfer level: Needs assistance Equipment  used: None;Rolling walker (2 wheeled) Transfers: Sit to/from Stand Sit to Stand: Supervision         General transfer comment: sit to stand at bedside without AD supervision. use of RW for dynamic tasks  Ambulation/Gait Ambulation/Gait assistance: Supervision Gait Distance (Feet): 48 Feet Assistive device: Rolling walker (2 wheeled) Gait Pattern/deviations: Step-to pattern;Antalgic;Trunk flexed Gait velocity: Decreased   General Gait Details: Slow, antalgic gait with RW and supervision for safety/lines; pt with improved ability to WBAT through L foot with cam walker boot donned; pt beginning to moan in pain, limiting further distance   Stairs             Wheelchair Mobility    Modified Rankin (Stroke Patients Only)       Balance Overall balance assessment: Needs assistance Sitting-balance support: Feet supported Sitting balance-[name] Scale: Good     Standing balance support: Bilateral upper extremity supported;No upper extremity supported;During functional activity Standing balance-[name] Scale: Fair Standing balance comment: fair static standing without AD at bedside, use of RW for mobility to offload pressure from L LE                            Cognition Arousal/Alertness: Awake/alert Behavior During Therapy: [facility] for tasks assessed/performed;Impulsive Overall Cognitive Status: No family/caregiver present to determine baseline cognitive functioning                                 General Comments: impulsive with movement in regards to lines, much more pleasant and participatory      Exercises      General Comments  General comments (skin integrity, edema, etc.): Educ on donning/doffing cam walker boot; encouraged boot on majority of day to maintain L ankle DF in preparation for future AFO wear, as well as protection during mobility; increased time discussing d/c recommendations - pt agreeable to home, interested in charity HHPT/OT  services and charity DME (RW and wheelchair) if an option, discussed this with CM Gavin Pound)      Pertinent Vitals/Pain Pain Assessment: Faces Faces Pain Scale: Hurts little more Pain Location: L anterior ankle and shin with ambulation Pain Descriptors / Indicators: Grimacing;Guarding;Discomfort;Moaning Pain Intervention(s): Monitored during session;Limited activity within patient's tolerance    Home Living                      Prior Function            PT Goals (current goals can now be found in the care plan section) Acute Rehab PT Goals Patient Stated Goal: to get home and hurt less PT Goal Formulation: With patient Time For Goal Achievement: 03/26/21 Potential to Achieve Goals: Good Progress towards PT goals: Progressing toward goals    Frequency    Min 3X/week      PT Plan Discharge plan needs to be updated    Co-evaluation              AM-PAC PT "6 Clicks" Mobility   Outcome Measure  Help needed turning from your back to your side while in a flat bed without using bedrails?: None Help needed moving from lying on your back to sitting on the side of a flat bed without using bedrails?: None Help needed moving to and from a bed to a chair (including a wheelchair)?: A Little Help needed standing up from a chair using your arms (e.g., wheelchair or bedside chair)?: A Little Help needed to walk in hospital room?: A Little Help needed climbing 3-5 steps with a railing? : A Little 6 Click Score: 20    End of Session   Activity Tolerance: Patient tolerated treatment well;Patient limited by pain Patient left: in bed;with call bell/phone within reach Nurse Communication: Mobility status PT Visit Diagnosis: Other abnormalities of gait and mobility (R26.89);Pain Pain - Right/Left: Left Pain - part of body: Ankle and joints of foot     Time: 6314-9702 PT Time Calculation (min) (ACUTE ONLY): 20 min  Charges:  $Therapeutic Activity: 8-22 mins                      Ina Homes, PT, DPT Acute Rehabilitation Services  Pager [phone] Office [phone]  Malachy Chamber 03/12/2021, 5:40 PM

## 2021-03-12 NOTE — Social Work (Addendum)
CSW spoke with pt in room. Pt has denied SNF and states he can manage at home with [facility] and his daughter who lives with him.  CSW shared this information with NCM Gavin Pound who will follow up with pt and make sure he will have a safe DC.

## 2021-03-12 NOTE — Progress Notes (Signed)
PT Cancellation Note  Patient Details Name: Adam James MRN: [id number] DOB: 26-Aug-1970   Cancelled Treatment:    Reason Eval/Treat Not Completed: Patient at procedure or test/unavailable (EGD). Will follow-up for PT treatment as schedule permits.  Ina Homes, PT, DPT Acute Rehabilitation Services  Pager [phone] Office [phone]  Malachy Chamber 03/12/2021, 10:11 AM

## 2021-03-12 NOTE — Op Note (Signed)
[facility] Patient Name: Adam James Procedure Date : 03/12/2021 MRN: [id number] Attending MD: Dub Amis. Tomasa Rand , MD Date of Birth: 1970-09-25 CSN: [id number] Age: 50 Admit Type: Inpatient Procedure:                Upper GI endoscopy Indications:              Acute post hemorrhagic anemia, Coffee-ground                            emesis, bloody phlegm Providers:                Lorin Picket E. Tomasa Rand, MD, Roselie Awkward, RN, Alan Ripper, Loma Sender, CRNA Referring MD:              Medicines:                Monitored Anesthesia Care Complications:            No immediate complications. Estimated Blood Loss:     Estimated blood loss: none. Procedure:                Pre-Anesthesia Assessment:                           - Prior to the procedure, a History and Physical                            was performed, and patient medications and                            allergies were reviewed. The patient's tolerance of                            previous anesthesia was also reviewed. The risks                            and benefits of the procedure and the sedation                            options and risks were discussed with the patient.                            All questions were answered, and informed consent                            was obtained. Prior Anticoagulants: The patient has                            taken no previous anticoagulant or antiplatelet                            agents except for aspirin. ASA Grade Assessment:  III - A patient with severe systemic disease. After                            reviewing the risks and benefits, the patient was                            deemed in satisfactory condition to undergo the                            procedure.                           After obtaining informed consent, the endoscope was                            passed under direct vision. Throughout  the                            procedure, the patient's blood pressure, pulse, and                            oxygen saturations were monitored continuously. The                            [id number]-[id number] ([id number] Olympus endoscope was introduced                            through the mouth, and advanced to the third part                            of duodenum. The upper GI endoscopy was                            accomplished without difficulty. The patient                            tolerated the procedure well. Scope In: Scope Out: Findings:      The examined portions of the nasopharynx, oropharynx and larynx were       normal.      LA Grade D (one or more mucosal breaks involving at least 75% of       esophageal circumference) esophagitis with no bleeding was found 38 to       43 cm from the incisors and LA Grade C esophagitis extended up to 28 cm       from the incisors. There was nearly circumferential ulceration of the       distal esophagus with an adherent clot approximately 8-9 mm in length in       the distal esophagus. This was not unroofed      The exam of the esophagus was otherwise normal.      A large amount of clotted blood and hematin was found in the gastric       body and fundus. The clot was successfully cleared with lavage and       suction and the mucosa underlying the clot appeared normal.  The exam of the stomach was otherwise normal.      The examined duodenum was normal. Impression:               - The examined portions of the nasopharynx,                            oropharynx and larynx were normal.                           - LA Grade D reflux esophagitis with                            ulceration/adherent clot and recent bleeding, but                            no active bleeding during the procedure                           - Clotted blood in the gastric body, otherwise                            normal stomach.                           - Normal examined  duodenum.                           - No specimens collected. Recommendation:           - Return patient to hospital ward for ongoing care.                           - Full liquid diet today.                           - Use Protonix (pantoprazole) 40 mg PO BID until                            repeat EGD in 8 weeks.                           - Use sucralfate suspension 1 gram PO QID for 2                            weeks.                           - Repeat upper endoscopy in 8 weeks to check                            healing.                           - Given his significant CAD and other risk factors,  and the fact that his bleeding is from reflux, not                            PUD, the benefits of aspirin likely outweigh the                            risks. Procedure Code(s):        --- Professional ---                           [id number], Esophagogastroduodenoscopy, flexible,                            transoral; diagnostic, including collection of                            specimen(s) by brushing or washing, when performed                            (separate procedure) Diagnosis Code(s):        --- Professional ---                           K21.00, Gastro-esophageal reflux disease with                            esophagitis, without bleeding                           K92.2, Gastrointestinal hemorrhage, unspecified                           D62, Acute posthemorrhagic anemia                           K92.0, Hematemesis CPT copyright 2019 American Medical Association. All rights reserved. The codes documented in this report are preliminary and upon coder review may  be revised to meet current compliance requirements. Kama Cammarano E. Tomasa Rand, MD 03/12/2021 11:27:36 AM This report has been signed electronically. Number of Addenda: 0

## 2021-03-13 ENCOUNTER — Encounter (HOSPITAL_COMMUNITY): Payer: Self-pay | Admitting: Gastroenterology

## 2021-03-13 DIAGNOSIS — D62 Acute posthemorrhagic anemia: Secondary | ICD-10-CM

## 2021-03-13 DIAGNOSIS — K209 Esophagitis, unspecified without bleeding: Secondary | ICD-10-CM

## 2021-03-13 DIAGNOSIS — L97509 Non-pressure chronic ulcer of other part of unspecified foot with unspecified severity: Secondary | ICD-10-CM

## 2021-03-13 DIAGNOSIS — E13621 Other specified diabetes mellitus with foot ulcer: Secondary | ICD-10-CM

## 2021-03-13 LAB — CBC
HCT: 21 % — ABNORMAL LOW (ref 39.0–52.0)
Hemoglobin: 6.9 g/dL — CL (ref 13.0–17.0)
MCH: 28.8 pg (ref 26.0–34.0)
MCHC: 32.9 g/dL (ref 30.0–36.0)
MCV: 87.5 fL (ref 80.0–100.0)
Platelets: 251 10*3/uL (ref 150–400)
RBC: 2.4 MIL/uL — ABNORMAL LOW (ref 4.22–5.81)
RDW: 15.1 % (ref 11.5–15.5)
WBC: 9.2 10*3/uL (ref 4.0–10.5)
nRBC: 0 % (ref 0.0–0.2)

## 2021-03-13 LAB — PREPARE PLATELET PHERESIS
Unit division: 0
Unit division: 0

## 2021-03-13 LAB — BASIC METABOLIC PANEL
Anion gap: 4 — ABNORMAL LOW (ref 5–15)
BUN: 30 mg/dL — ABNORMAL HIGH (ref 6–20)
CO2: 30 mmol/L (ref 22–32)
Calcium: 7.3 mg/dL — ABNORMAL LOW (ref 8.9–10.3)
Chloride: 99 mmol/L (ref 98–111)
Creatinine, Ser: 1.74 mg/dL — ABNORMAL HIGH (ref 0.61–1.24)
GFR, Estimated: 48 mL/min — ABNORMAL LOW (ref >60)
Glucose, Bld: 184 mg/dL — ABNORMAL HIGH (ref 70–99)
Potassium: 3.3 mmol/L — ABNORMAL LOW (ref 3.5–5.1)
Sodium: 133 mmol/L — ABNORMAL LOW (ref 135–145)

## 2021-03-13 LAB — CK: Total CK: 69 U/L (ref 49–397)

## 2021-03-13 LAB — CBC WITH DIFFERENTIAL/PLATELET
Abs Immature Granulocytes: 0.03 10*3/uL (ref 0.00–0.07)
Basophils Absolute: 0.1 10*3/uL (ref 0.0–0.1)
Basophils Relative: 1 %
Eosinophils Absolute: 0.3 10*3/uL (ref 0.0–0.5)
Eosinophils Relative: 4 %
HCT: 23.3 % — ABNORMAL LOW (ref 39.0–52.0)
Hemoglobin: 7.7 g/dL — ABNORMAL LOW (ref 13.0–17.0)
Immature Granulocytes: 0 %
Lymphocytes Relative: 20 %
Lymphs Abs: 1.8 10*3/uL (ref 0.7–4.0)
MCH: 29.5 pg (ref 26.0–34.0)
MCHC: 33 g/dL (ref 30.0–36.0)
MCV: 89.3 fL (ref 80.0–100.0)
Monocytes Absolute: 0.4 10*3/uL (ref 0.1–1.0)
Monocytes Relative: 5 %
Neutro Abs: 6.4 10*3/uL (ref 1.7–7.7)
Neutrophils Relative %: 70 %
Platelets: 242 10*3/uL (ref 150–400)
RBC: 2.61 MIL/uL — ABNORMAL LOW (ref 4.22–5.81)
RDW: 14.7 % (ref 11.5–15.5)
WBC: 9.1 10*3/uL (ref 4.0–10.5)
nRBC: 0 % (ref 0.0–0.2)

## 2021-03-13 LAB — BPAM PLATELET PHERESIS
Blood Product Expiration Date: 202208192359
Blood Product Expiration Date: 202208202359
Unit Type and Rh: 5100
Unit Type and Rh: 7300

## 2021-03-13 LAB — GLUCOSE, CAPILLARY
Glucose-Capillary: 168 mg/dL — ABNORMAL HIGH (ref 70–99)
Glucose-Capillary: 127 mg/dL — ABNORMAL HIGH (ref 70–99)
Glucose-Capillary: 109 mg/dL — ABNORMAL HIGH (ref 70–99)
Glucose-Capillary: 168 mg/dL — ABNORMAL HIGH (ref 70–99)

## 2021-03-13 LAB — PREPARE RBC (CROSSMATCH)

## 2021-03-13 LAB — MAGNESIUM: Magnesium: 1.9 mg/dL (ref 1.7–2.4)

## 2021-03-13 MED ORDER — SODIUM CHLORIDE 0.9% IV SOLUTION: Freq: Once | INTRAVENOUS | Status: AC

## 2021-03-13 MED ORDER — POTASSIUM CHLORIDE 10 MEQ/100ML IV SOLN
10.0000 meq | INTRAVENOUS | Status: AC
Start: 2021-03-13 — End: 2021-03-13
  Administered 2021-03-13 (×3): 10 meq via INTRAVENOUS
  Filled 2021-03-13 (×3): qty 100

## 2021-03-13 MED ORDER — CALCIUM GLUCONATE-NACL 2-0.675 GM/100ML-% IV SOLN
2.0000 g | Freq: Once | INTRAVENOUS | Status: AC
  Administered 2021-03-13: 2000 mg via INTRAVENOUS
  Filled 2021-03-13: qty 100

## 2021-03-13 MED ORDER — MAGNESIUM SULFATE 2 GM/50ML IV SOLN
2.0000 g | Freq: Once | INTRAVENOUS | Status: AC
  Administered 2021-03-13: 2 g via INTRAVENOUS
  Filled 2021-03-13: qty 50

## 2021-03-13 MED ORDER — SILVER SULFADIAZINE 1 % EX CREA
TOPICAL_CREAM | Freq: Every day | CUTANEOUS | Status: DC
  Administered 2021-03-14 – 2021-03-15 (×2): 1 via TOPICAL
  Filled 2021-03-13 (×2): qty 85

## 2021-03-13 MED ORDER — FUROSEMIDE 10 MG/ML IJ SOLN
20.0000 mg | Freq: Once | INTRAMUSCULAR | Status: AC
  Administered 2021-03-13: 20 mg via INTRAVENOUS
  Filled 2021-03-13: qty 2

## 2021-03-13 MED ORDER — SODIUM CHLORIDE 0.9 % IV SOLN
3.0000 g | Freq: Four times a day (QID) | INTRAVENOUS | Status: DC
  Administered 2021-03-13 – 2021-03-15 (×8): 3 g via INTRAVENOUS
  Filled 2021-03-13 (×9): qty 8

## 2021-03-13 NOTE — TOC Transition Note (Addendum)
Transition of Care [facility]) - CM/SW Discharge Note   Patient Details  Name: Adam James MRN: [id number] Date of Birth: 1971-07-13  Transition of Care [facility]) CM/SW Contact:  Leone Haven, RN Phone Number: 03/13/2021, 4:53 PM   Clinical Narrative:    NCM received call from [facility] with [facility], she states as long as patient has a caregiver at home that can be taught how to do the dressing change they can take the referral for [facility].  Patient states he lives with daughter. The dressing is just putting silvadene on his foot.  Neysa Bonito states her manager will look over to make sure they can take the referral but she feesl confident that they can.  She will contact Marylene Land the NCM covering in the am.  Patient has the rolling walker in the room and the w/chair will be delivered to his home.    Final next level of care: Home w Home Health Services Barriers to Discharge: Continued Medical Work up   Patient Goals and CMS Choice Patient states their goals for this hospitalization and ongoing recovery are:: return home CMS Medicare.gov Compare Post Acute Care list provided to:: Patient Choice offered to / list presented to : Patient  Discharge Placement                       Discharge Plan and Services   Discharge Planning Services: CM Consult Post Acute Care Choice: Durable Medical Equipment, Home Health          DME Arranged: Walker rolling, Community education officer wheelchair with seat cushion DME Agency: [employer] Date DME Agency Contacted: 03/13/21 Time DME Agency Contacted: 0830 Representative spoke with at DME Agency: Silvio Pate HH Arranged: RN, PT HH Agency: [facility] Date [facility] Agency Contacted: 03/13/21 Time HH Agency Contacted: 1653 Representative spoke with at [facility] Agency: Neysa Bonito  Social Determinants of Health (SDOH) Interventions     Readmission Risk Interventions No flowsheet data found.

## 2021-03-13 NOTE — Progress Notes (Signed)
Patient is postop day 6 status post irrigation and debridement of left leg wound.  He is lying in bed.  Wound VAC has no further drainage.  Wound VAC was completely removed.  Approximately as well apposed wound edges with sutures surgical sutures in place has some open areas distally underneath his foot but no surrounding cellulitis swelling is overall well controlled minimal bleeding.  Patient will start daily dressing changes may cleanse with antibacterial soap and water thin layer of Silvadene should be applied to areas that are open.  He will need 1 week follow-up in our office.

## 2021-03-13 NOTE — Progress Notes (Signed)
HD#18 SUBJECTIVE:  Patient Summary: Adam James is a 50 y.o. with a pertinent PMH of diabetes, ischemic heart disease, chronic heart failure with reduced EF, and CKD IIIa who presented s/p irrigation and debridement for left foot diabetic infection with gas gangrene and necrotizing fasciitis  Overnight Events: no acute events overnight  Interim History: This is hospital day 81 for [name] who was seen and evaluated at the bedside this morning. He reports no further episodes of coughing up blood and states that he feels well overall. He continues to have a good appetite, and wishes to advance his diet to a regular diet, however, discussed that we would like to see his Hb normalize before we are able to do this. Patient is endorsing some pain in his left lower extremity, as he just had his wound vac removed this morning. He has been working with PT/OT and is able to bear weight on his left lower extremity and ambulate.   OBJECTIVE:  Vital Signs: Vitals:   03/12/21 1555 03/12/21 2020 03/13/21 0100 03/13/21 0323  BP: 103/70 110/62 120/79 123/81  Pulse: 99 94 96 95  Resp: 17 16 14 18   Temp: (!) 97 F (36.1 C) 99.1 F (37.3 C) 98.4 F (36.9 C) 98.6 F (37 C)  TempSrc: Oral Oral Oral   SpO2: 96% 95% 96% 97%  Weight:      Height:       Supplemental O2: Room Air SpO2: 97%  Filed Weights   03/10/21 0523 03/11/21 0429 03/12/21 0600  Weight: 114.4 kg 117.4 kg 117.6 kg     Intake/Output Summary (Last 24 hours) at 03/13/2021 03/15/2021 Last data filed at 03/13/2021 03/15/2021 Gross per 24 hour  Intake 1640 ml  Output 425 ml  Net 1215 ml   Net IO Since Admission: 13,270.17 mL [03/13/21 0608]  Physical Exam: Constitutional: well-appearing middle-aged male resting in bed , in no acute distress Cardiovascular: regular rate and rhythm, S1 and S2 present, no m/r/g Pulmonary/Chest: normal work of breathing on room air, lungs clear to auscultation bilaterally MSK: normal bulk and tone in RLE  with palpable radial and dorsalis pedis pulses on R 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 No obvious rash or lesions; sacral edema present  Psych: Normal mood and affect     ASSESSMENT/PLAN:  Assessment: Principal Problem:   Gangrene of left foot (HCC) Active Problems:   Cardiomyopathy (HCC)   Type 2 diabetes mellitus (HCC)   Diabetic foot ulcer (HCC)   CAD (coronary artery disease)   Necrotizing fasciitis (HCC)   Left leg cellulitis   Diabetic polyneuropathy associated with type 2 diabetes mellitus (HCC)   Severe protein-calorie malnutrition (HCC)   Gastrointestinal hemorrhage   Gastroesophageal reflux disease with esophagitis and hemorrhage   Anemia, posthemorrhagic, acute   Acute esophagitis   Plan: #LLE necrotizing fascitis s/p multiple I&D's Patient is s/p I&D on 8/3, 8/8 and 8/12 for attempted limb salvage. Patient had wound vac removed this morning as there was no further drainage. He remains on IV daptomycin, rocephin and flagyl and had his tunneled IJ placed on 8/15 for long term antibiotics. Patient remains afebrile and his white blood cell count is improved to 9 today. Patient has been working with PT/OT and is able to bear weight on the extremity with cam boot. Recommended for home health PT, however, patient would prefer SNF due to lack of support/assistance at home.  -  ID consulted, appreciate recommendations  - IV daptomycin, rocephin, and flagyl IV  - Orthopedic surgery consulted, appreciate recommendations - Encourage to participate in PT/OT   #Severe erosive esophagitis with ulceration Patient started coughing up coffee ground sputum with some bright red blood noted on 8/17. Hb 6.9 overnight again and he was given another unit of PRBC and platelets, with a dose of 20mg  lasix in between to prevent volume overload. He is now s/p 10 total units of blood this admission and 2 units of platelets.  - 40 mg  protonix BID and carafate x2 weeks - Full liquid diet, will advance when Hb stabilizes - GI consulted, appreciate recommendations  - Repeat EGD in 8 weeks  - Heparin DVT prophylaxis discontinued  #Acute kidney injury on chronic kidney disease IIIa likely 2/2 acute tubular necrosis  Initially noted to have worsening renal function in setting multiple I&D's. sCr improved to 1.74 today and GFR remains >30. BUN also improved also improved to 30 today.  - Continue to monitor renal function - Avoid nephrotoxic agents - Supportive care and encourage PO intake  - Could adjust DVT prophylaxis to xarelto once bleeding stabilizes as GFR >30   #Acute on chronic normocytic anemia likely 2/2 acute blood loss #Hx of ischemic heart disease and HFrEF   He continues to deny any chest pain, palpitations or dyspnea at this time. Hb goal remains >8 in the setting of ischemic heart disease.  - Transfuse if Hb <8 - Metoprolol 50mg  daily    #Type II DM with diabetic neuropathy CBG's slightly elevated today, 140s-180s on current regimen. Goal for glucose levels <180. - Continue Semglee 15U + Novolog 8U + SSI with meals   Best Practice: Diet: full liquid IVF: none VTE: holding 2/2 to Hb drop Code: Full AB: Daptomycin, ceftriaxone, flagyl Therapy Recs: SNF vs home health DISPO: Anticipated discharge to Skilled nursing facility pending Medical stability.  Signature: , D.O.  Internal Medicine Resident, PGY-1 Internal Medicine Residency  Pager: [phone] 6:08 AM, 03/13/2021   Please contact the on call pager after 5 pm and on weekends at [phone].

## 2021-03-13 NOTE — Progress Notes (Addendum)
I have seen and examined the patient. I have personally reviewed the clinical findings, laboratory findings, microbiological data and imaging studies. The assessment and treatment plan was discussed with the  APP, Adam James.   I agree with their recommendations with the following comments:  Patient s/p debridement x 3 in the setting of OM and left leg nec fasc.  Last dbt was 8/12.  OR cx have been polymicrobial and he is currently on ctx/mtz/dapto.  S/p EGD yesterday which likely explains his nausea as opposed to abx side effect.  Will consolidate therapy back to unasyn and continue dapto today.  Dispo planning for home abx with anticipation of 6 weeks therapy from date of last dbt.   Adam James for Infectious Disease [facility] 03/13/2021, 12:30 PM   [facility]  Date of Admission:  02/24/2021     Total days of antibiotics 17   Daptomycin   Unasyn 8/18 (resumed after alternative GI sx cause realized)           ASSESSMENT: Adam James is a 50 y.o. 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 He has undergone 3 debridements last on 8/12. Intraoperative cultures as expected are polymicrobial with MRSA (R-bactrim, cipro), streptococcus agalactiae, Eikenella corrodens and clostridium species.   EGD results reviewed - suspect we can switch him back to Unasyn to consolidate therapy for him while he continues to be inpatient. Discussed with Adam James and he agrees to resume this. Stop flagyl and ceftriaxone. Continue Daptomycin for now.   Unclear what his discharge plan is going to be. Hopeful for CIR, though ahs not been participating in therapy. Once a more firm plan in place we can make further recommendations for treatment and coordinate with home health for financial assistance vs oral therapy.  Daptomycin may be prohibiting outpatient due to cost but ultimately  preferred with safety if we need to continue to treat with IV. He has tunneled PICC in place now for access.    PLAN: Continue daptomycin  Restart unasyn  Stop flagyl / ceftriaxone Will repeat inflammatory markers in AM to follow therapeutic response.    Principal Problem:   Gangrene of left foot (HCC) Active Problems:   Cardiomyopathy (HCC)   Type 2 diabetes mellitus (HCC)   Diabetic foot ulcer (HCC)   CAD (coronary artery disease)   Necrotizing fasciitis (HCC)   Left leg cellulitis   Diabetic polyneuropathy associated with type 2 diabetes mellitus (HCC)   Severe protein-calorie malnutrition (HCC)   Gastrointestinal hemorrhage   Gastroesophageal reflux disease with esophagitis and hemorrhage   Anemia, posthemorrhagic, acute   Acute esophagitis    acetaminophen  1,000 mg Oral Q8H   aspirin EC  81 mg Oral Daily   docusate sodium  100 mg Oral BID   insulin aspart  0-9 Units Subcutaneous TID WC   insulin aspart  8 Units Subcutaneous TID WC   insulin glargine-yfgn  15 Units Subcutaneous QHS   metoprolol succinate  50 mg Oral Daily   pantoprazole  40 mg Oral BID AC   silver sulfADIAZINE   Topical Daily   sucralfate  1 g Oral TID WC & HS    SUBJECTIVE: Waiting for his leg to be re-wrapped. No further problems with nausea/vomiting.    Review of Systems: Review of Systems  Constitutional:  Negative for chills and fever.  HENT:  Negative for tinnitus.  Eyes:  Negative for blurred vision and photophobia.  Respiratory:  Negative for cough and sputum production.   Cardiovascular:  Positive for leg swelling. Negative for chest pain.  Gastrointestinal:  Negative for diarrhea, nausea and vomiting.  Genitourinary:  Negative for dysuria.  Musculoskeletal:  Positive for joint pain (L foot pain).  Skin:  Negative for rash.  Neurological:  Negative for headaches.    Allergies  Allergen Reactions   Morphine Itching    OBJECTIVE: Vitals:   03/13/21 0323 03/13/21 0605  03/13/21 0620 03/13/21 1108  BP: 123/81 120/72 117/74 120/82  Pulse: 95 97 96 97  Resp: 18 17 16 18   Temp: 98.6 F (37 C) 98.2 F (36.8 C) 99.3 F (37.4 C) 98.4 F (36.9 C)  TempSrc:  Oral Oral Oral  SpO2: 97% 95% 95% 96%  Weight:      Height:       Body mass index is 32.41 kg/m.  Physical Exam Constitutional:      Appearance: Normal appearance. He is not ill-appearing.  Eyes:     General: No scleral icterus. Cardiovascular:     Rate and Rhythm: Normal rate and regular rhythm.     Pulses: Normal pulses.     Heart sounds: Normal heart sounds.  Pulmonary:     Effort: Pulmonary effort is normal.     Breath sounds: Normal breath sounds.  Abdominal:     General: Bowel sounds are normal. There is no distension.     Palpations: Abdomen is soft. There is no fluid wave, hepatomegaly or splenomegaly.     Tenderness: There is no abdominal tenderness.  Musculoskeletal:        General: No swelling.     Right lower leg: No edema.     Left lower leg: No edema.       Legs:     Comments: Surgical incision clean. Some bleeding on gauze noted. Generalized swelling post op but no cellulitis.   Skin:    Capillary Refill: Capillary refill takes less than 2 seconds.     Coloration: Skin is not jaundiced.     Findings: No rash.  Neurological:     Mental Status: He is alert and oriented to person, place, and time.  Psychiatric:        Behavior: Behavior normal.        Judgment: Judgment normal.    Lab Results Lab Results  Component Value Date   WBC 9.2 03/13/2021   HGB 6.9 (LL) 03/13/2021   HCT 21.0 (L) 03/13/2021   MCV 87.5 03/13/2021   PLT 251 03/13/2021    Lab Results  Component Value Date   CREATININE 1.74 (H) 03/13/2021   BUN 30 (H) 03/13/2021   NA 133 (L) 03/13/2021   K 3.3 (L) 03/13/2021   CL 99 03/13/2021   CO2 30 03/13/2021    Lab Results  Component Value Date   ALT 13 02/24/2021   AST 20 02/24/2021   ALKPHOS 97 02/24/2021   BILITOT 0.7 02/24/2021      Microbiology: No results found for this or any previous visit (from the past 240 hour(s)).   04/26/2021, MSN, NP-C [facility]  [email or web address] Pager: [phone] Office: [phone] [facility] Main Line: [phone]

## 2021-03-13 NOTE — Progress Notes (Signed)
Physical Therapy Treatment Patient Details Name: Adam James MRN: [id number] DOB: January 07, 1971 Today's Date: 03/13/2021    History of Present Illness Pt is a 50 y.o. male admitted 02/24/21 with bilateral foot wounds. Pt with L foot diabetic infection with gas gangrene, R foot diabetic ulcer. Ortho workup reports LLE diabetic insensate neuropathy with necrotizing fasciitis involving the L anterior/lateral compartments and dorsum of L foot. S/p L foot I&D 8/1. S/p additional L foot and lower leg I&D with wound vac placement 8/3, 8/8, 8/12. Pt developed bloody sputum; s/p EGD 8/17. PMH includes DM2, neuropathy, CAD, cardiomyopathy.    PT Comments    Pt tolerated additional transfer training, as well as stair training this session; good ability to ascend/descend steps with rail support. Pt remains limited by pain; reports he does not want to take pain meds. Pt also with c/o upper trap/neck tightness/pain; educ on therex for this (handout provided). Will continue to follow acutely; would benefit from wheelchair-training next session.    Follow Up Recommendations  Home health PT;Supervision - Intermittent     Equipment Recommendations  Rolling walker with 5" wheels;Wheelchair (measurements PT);Wheelchair cushion (measurements PT);3in1 (PT)    Recommendations for Other Services       Precautions / Restrictions Precautions Precautions: Fall Required Braces or Orthoses: Other Brace Other Brace: CAM walker boot - seems appropriate for L foot, but order for R foot (MD/PA yet to clarify); 8/18- pt wearing cam boot on LLE, offloading shoe on R foot Restrictions Weight Bearing Restrictions: Yes RLE Weight Bearing: Weight bearing as tolerated LLE Weight Bearing: Weight bearing as tolerated    Mobility  Bed Mobility Overal bed mobility: Modified Independent                  Transfers Overall transfer level: Modified independent Equipment used: None Transfers: Sit to/from [employer] Sit to Stand: Modified independent (Device/Increase time) Stand pivot transfers: Modified independent (Device/Increase time)       General transfer comment: Mod indep with sit<>stand from bed and transport chair, as well as stand pivot transfer between them  Ambulation/Gait             General Gait Details: pt declined secondary to pain and wanting to focus on stair training   Stairs Stairs: Yes Stairs assistance: Supervision Stair Management: Two rails;Step to pattern;Alternating pattern;Forwards;Backwards Number of Stairs: 6 General stair comments: Ascend/descend 3 steps with bilateral rail support, cues for sequencing/technique; pt able to perform 2x with supervision for safety; no overt instability, good ability to perform with rail support   Wheelchair Mobility    Modified Rankin (Stroke Patients Only)       Balance Overall balance assessment: Needs assistance Sitting-balance support: Feet supported Sitting balance-[name] Scale: Good       Standing balance-[name] Scale: Poor Standing balance comment: Needing at least single UE support for static standing to offload pain LLE                            Cognition Arousal/Alertness: Awake/alert Behavior During Therapy: WFL for tasks assessed/performed Overall Cognitive Status: Within Functional Limits for tasks assessed                                 General Comments: Agreeable and participatory, internally distracted by pain; suspect decreased insight into condition, but pt overall more receptive to education  Exercises Other Exercises Other Exercises: [employer] HEP handout (Access Code [id number]) provided - levator scap stretch, upper trap stretches (cervical flexion, later flexion), seated scapular retraction holds, supine scapular protraction    General Comments General comments (skin integrity, edema, etc.): RW delivered to room, adjusted for pt's height; pt  reports a wheelchair is being delivered to his hosue. Pt had wound vac discharged. Pt reports neck/trap pain with standing mobility, states, "My doctor told me my neck hurts because they took my toe" (educ pt this is likely due to postural/balance changes post-op resulting in associated muscle tightness/weakness; pt somewhat receptive to this education, agreeable to HEP handout, but did not want exercises explained)      Pertinent Vitals/Pain Pain Assessment: Faces Faces Pain Scale: Hurts even more Pain Location: L anterior ankle and shin with ambulation Pain Descriptors / Indicators: Grimacing;Guarding;Discomfort;Moaning Pain Intervention(s): Monitored during session;Limited activity within patient's tolerance;Other (comment) (pt notes he is not taking pain meds)    Home Living                      Prior Function            PT Goals (current goals can now be found in the care plan section) Progress towards PT goals: Progressing toward goals    Frequency    Min 3X/week      PT Plan Current plan remains appropriate    Co-evaluation              AM-PAC PT "6 Clicks" Mobility   Outcome Measure  Help needed turning from your back to your side while in a flat bed without using bedrails?: None Help needed moving from lying on your back to sitting on the side of a flat bed without using bedrails?: None Help needed moving to and from a bed to a chair (including a wheelchair)?: None Help needed standing up from a chair using your arms (e.g., wheelchair or bedside chair)?: None Help needed to walk in hospital room?: A Little Help needed climbing 3-5 steps with a railing? : A Little 6 Click Score: 22    End of Session   Activity Tolerance: Patient tolerated treatment well;Patient limited by pain Patient left: in bed;with call bell/phone within reach Nurse Communication: Mobility status PT Visit Diagnosis: Other abnormalities of gait and mobility (R26.89);Pain Pain  - Right/Left: Left Pain - part of body: Ankle and joints of foot     Time: 1139-1209 PT Time Calculation (min) (ACUTE ONLY): 30 min  Charges:  $Gait Training: 8-22 mins $Therapeutic Activity: 8-22 mins                     Ina Homes, PT, DPT Acute Rehabilitation Services  Pager [phone] Office [phone]  Malachy Chamber 03/13/2021, 1:16 PM

## 2021-03-13 NOTE — Progress Notes (Signed)
[facility] ROUNDING NOTE   Subjective: Patient reporting dark liquid stools over the past 24 hours.  Denies having a tarry appearance, but states that there darker than the dark green stools he was having an earlier days.  He denied to me having any more bloody refluxate, but apparently told his primary team this morning that he did have some last night.  The patient is eager to advance his diet and eat regular food. His hemoglobin dropped from 7.7 to 6.9 this morning.  His BUN decreased from 39-30.   Objective: Vital signs in last 24 hours: Temp:  [97 F (36.1 C)-99.3 F (37.4 C)] 98.4 F (36.9 C) (08/18 1108) Pulse Rate:  [94-99] 97 (08/18 1108) Resp:  [14-18] 18 (08/18 1108) BP: (103-123)/(62-82) 120/82 (08/18 1108) SpO2:  [95 %-97 %] 96 % (08/18 1108) Last BM Date: 03/11/21 General: NAD Lungs:  CTA b/l, no w/r/r Heart:  RRR, no m/r/g Abdomen:  Soft, NT, ND, +BS Ext: Left leg out of wound VAC today, well-healed surgical scars covered with light dressing    Intake/Output from previous day: 08/17 0701 - 08/18 0700 In: 1640 [P.O.:1440; IV Piggyback:200] Out: 425 [Urine:425] Intake/Output this shift: Total I/O In: 358 [Blood:358] Out: -    Lab Results: Recent Labs    03/12/21 0630 03/12/21 1225 03/13/21 0500  WBC 13.6* 14.5* 9.2  HGB 7.5* 7.7* 6.9*  PLT 301 298 251  MCV 87.1 86.6 87.5   BMET Recent Labs    03/11/21 0353 03/12/21 0630 03/13/21 0500  NA 135 135 133*  K 3.2* 3.2* 3.3*  CL 95* 98 99  CO2 32 30 30  GLUCOSE 195* 181* 184*  BUN 36* 39* 30*  CREATININE 2.24* 1.90* 1.74*  CALCIUM 7.4* 7.1* 7.3*   LFT No results for input(s): PROT, ALBUMIN, AST, ALT, ALKPHOS, BILITOT, BILIDIR, IBILI in the last 72 hours. PT/INR No results for input(s): INR in the last 72 hours.    Imaging/Other results: No results found.    Assessment and Plan:  50 year old male with prolonged admission for necrotizing fasciitis, noted have coffee ground  emesis and nausea with poor appetite in the setting of declining hemoglobin, found to have severe erosive esophagitis with ulceration and adherent clot on upper endoscopy August 17.  Given the diffuse ulceration and inflammation and absence of active bleeding, attempts to unroofed the clot and provide more definitive hemostasis (Hemoclip or cauterization) were not performed.  He is now on the appropriate medical therapy to help his esophagitis heal.  Although he may continue to ooze as the healing occurs, I do not believe he is high risk for an arterial bleeding source.  For now, I would recommend against repeat endoscopy and continue to treat his esophagitis supportively with acid suppression and Carafate.  I agree with keeping him on a liquid diet until his hemoglobin demonstrates better stability.  GERD with erosive esophagitis and hemorrhage - PPI p.o. twice daily to be continued until his repeat EGD (will be scheduled as an outpatient in about 8 weeks) - Continue Carafate 4 times daily for 2 weeks - Continue liquid diet for now, advance when hemoglobin remained stable   GI will sign off for now.  Please reengage if he continues to have problems with recurrent bleeding   Jenel Lucks, MD  03/13/2021, 11:44 AM [facility]

## 2021-03-14 ENCOUNTER — Other Ambulatory Visit (HOSPITAL_COMMUNITY): Payer: Self-pay

## 2021-03-14 ENCOUNTER — Inpatient Hospital Stay (HOSPITAL_COMMUNITY): Payer: Self-pay

## 2021-03-14 LAB — TYPE AND SCREEN
ABO/RH(D): A NEG
Antibody Screen: NEGATIVE
Unit division: 0
Unit division: 0
Unit division: 0
Unit division: 0

## 2021-03-14 LAB — BPAM RBC
Blood Product Expiration Date: 202208212359
Blood Product Expiration Date: 202208252359
Blood Product Expiration Date: 202209062359
Blood Product Expiration Date: 202209062359
ISSUE DATE / TIME: 202208160615
ISSUE DATE / TIME: 202208161656
ISSUE DATE / TIME: 202208170048
ISSUE DATE / TIME: 202208180600
Unit Type and Rh: 600
Unit Type and Rh: 600
Unit Type and Rh: 600
Unit Type and Rh: 600

## 2021-03-14 LAB — CBC
HCT: 23.7 % — ABNORMAL LOW (ref 39.0–52.0)
Hemoglobin: 8 g/dL — ABNORMAL LOW (ref 13.0–17.0)
MCH: 30 pg (ref 26.0–34.0)
MCHC: 33.8 g/dL (ref 30.0–36.0)
MCV: 88.8 fL (ref 80.0–100.0)
Platelets: 238 10*3/uL (ref 150–400)
RBC: 2.67 MIL/uL — ABNORMAL LOW (ref 4.22–5.81)
RDW: 14.9 % (ref 11.5–15.5)
WBC: 8 10*3/uL (ref 4.0–10.5)
nRBC: 0 % (ref 0.0–0.2)

## 2021-03-14 LAB — BASIC METABOLIC PANEL
Anion gap: 5 (ref 5–15)
BUN: 18 mg/dL (ref 6–20)
CO2: 28 mmol/L (ref 22–32)
Calcium: 7.5 mg/dL — ABNORMAL LOW (ref 8.9–10.3)
Chloride: 100 mmol/L (ref 98–111)
Creatinine, Ser: 1.61 mg/dL — ABNORMAL HIGH (ref 0.61–1.24)
GFR, Estimated: 52 mL/min — ABNORMAL LOW (ref >60)
Glucose, Bld: 181 mg/dL — ABNORMAL HIGH (ref 70–99)
Potassium: 3.4 mmol/L — ABNORMAL LOW (ref 3.5–5.1)
Sodium: 133 mmol/L — ABNORMAL LOW (ref 135–145)

## 2021-03-14 LAB — GLUCOSE, CAPILLARY
Glucose-Capillary: 160 mg/dL — ABNORMAL HIGH (ref 70–99)
Glucose-Capillary: 257 mg/dL — ABNORMAL HIGH (ref 70–99)
Glucose-Capillary: 235 mg/dL — ABNORMAL HIGH (ref 70–99)
Glucose-Capillary: 230 mg/dL — ABNORMAL HIGH (ref 70–99)

## 2021-03-14 LAB — C-REACTIVE PROTEIN: CRP: 1.6 mg/dL — ABNORMAL HIGH (ref <1.0)

## 2021-03-14 LAB — SEDIMENTATION RATE: Sed Rate: 25 mm/hr — ABNORMAL HIGH (ref 0–16)

## 2021-03-14 MED ORDER — SODIUM CHLORIDE 0.9 % IV SOLN: Freq: Two times a day (BID) | INTRAVENOUS | Status: DC

## 2021-03-14 MED ORDER — CALCIUM GLUCONATE-NACL 2-0.675 GM/100ML-% IV SOLN
2.0000 g | Freq: Once | INTRAVENOUS | Status: AC
  Administered 2021-03-14: 2000 mg via INTRAVENOUS
  Filled 2021-03-14: qty 100

## 2021-03-14 MED ORDER — POTASSIUM CHLORIDE 20 MEQ PO PACK
40.0000 meq | PACK | Freq: Two times a day (BID) | ORAL | Status: DC
  Administered 2021-03-14: 40 meq via ORAL
  Filled 2021-03-14: qty 2

## 2021-03-14 MED ORDER — AMOXICILLIN-POT CLAVULANATE 875-125 MG PO TABS
1.0000 | ORAL_TABLET | Freq: Two times a day (BID) | ORAL | 1 refills | Status: AC
  Filled 2021-03-14: qty 60, 30d supply, fill #0

## 2021-03-14 MED ORDER — ORITAVANCIN DIPHOSPHATE 400 MG IV SOLR
1200.0000 mg | Freq: Once | INTRAVENOUS | Status: AC
  Administered 2021-03-14: 1200 mg via INTRAVENOUS
  Filled 2021-03-14: qty 120

## 2021-03-14 MED ORDER — PANTOPRAZOLE SODIUM 40 MG PO TBEC
40.0000 mg | DELAYED_RELEASE_TABLET | Freq: Two times a day (BID) | ORAL | 0 refills | Status: DC
  Filled 2021-03-14: qty 60, 30d supply, fill #0

## 2021-03-14 MED ORDER — POTASSIUM CHLORIDE CRYS ER 20 MEQ PO TBCR
40.0000 meq | EXTENDED_RELEASE_TABLET | Freq: Two times a day (BID) | ORAL | Status: DC
  Administered 2021-03-14 – 2021-03-15 (×2): 40 meq via ORAL
  Filled 2021-03-14 (×2): qty 2

## 2021-03-14 MED ORDER — DOXYCYCLINE HYCLATE 100 MG PO TABS
100.0000 mg | ORAL_TABLET | Freq: Two times a day (BID) | ORAL | 0 refills | Status: AC
  Filled 2021-03-14: qty 60, 30d supply, fill #0

## 2021-03-14 MED ORDER — SUCRALFATE 1 GM/10ML PO SUSP
1.0000 g | Freq: Three times a day (TID) | ORAL | 0 refills | Status: DC
  Filled 2021-03-14: qty 480, 12d supply, fill #0

## 2021-03-14 MED ORDER — METOPROLOL SUCCINATE ER 50 MG PO TB24
50.0000 mg | ORAL_TABLET | Freq: Every day | ORAL | 0 refills | Status: DC
  Filled 2021-03-14: qty 30, 30d supply, fill #0

## 2021-03-14 NOTE — Discharge Summary (Addendum)
Name: Adam James MRN: [id number] DOB: [birth year] 50 y.o. PCP: Patient, No Pcp Per (Inactive)  Date of Admission: 02/24/2021  4:40 PM Date of Discharge:  03/15/2021 Attending Physician: Dr. Criselda Peaches  DISCHARGE DIAGNOSIS:  Primary Problem: Gangrene of left foot [facility] Adam)   James Problems: Principal Problem:   Gangrene of left foot (HCC) Active Problems:   Cardiomyopathy (HCC)   Type 2 diabetes mellitus (HCC)   Diabetic foot ulcer (HCC)   CAD (coronary artery disease)   Necrotizing fasciitis (HCC)   Left leg cellulitis   Diabetic polyneuropathy associated with type 2 diabetes mellitus (HCC)   Severe protein-calorie malnutrition (HCC)   Gastrointestinal hemorrhage   Gastroesophageal reflux disease with esophagitis and hemorrhage   Anemia, posthemorrhagic, acute   Acute esophagitis    DISCHARGE MEDICATIONS:   Allergies as of 03/15/2021       Reactions   Morphine Itching        Medication List     TAKE these medications    amLODipine 5 MG tablet Commonly known as: NORVASC Take 1 tablet (5 mg total) by mouth daily.   amoxicillin-clavulanate 875-125 MG tablet Commonly known as: Augmentin Take 1 tablet by mouth 2 (two) times daily.   aspirin EC 81 MG tablet Take 1 tablet (81 mg total) by mouth daily. Swallow whole.   atorvastatin 80 MG tablet Commonly known as: LIPITOR Take 1 tablet (80 mg total) by mouth daily.   doxycycline 100 MG tablet Commonly known as: VIBRA-TABS Take 1 tablet (100 mg total) by mouth 2 (two) times daily.   furosemide 40 MG tablet Commonly known as: LASIX Take 1 tablet (40 mg total) by mouth daily. Take 40 mg every other day alternating with 2 tablets (80mg ) every other day What changed: how much to take   glyBURIDE 5 MG tablet Commonly known as: DIABETA Take 5 mg by mouth 2 (two) times daily.   irbesartan 75 MG tablet Commonly known as: AVAPRO Take 1 tablet (75 mg total) by mouth daily.   metFORMIN 500 MG tablet Commonly  known as: GLUCOPHAGE Take 500 mg by mouth 2 (two) times daily.   metoprolol succinate 50 MG 24 hr tablet Commonly known as: TOPROL-XL Take 1 tablet (50 mg total) by mouth daily. Take with or immediately following a meal. What changed:  medication strength how much to take additional instructions   pantoprazole 40 MG tablet Commonly known as: PROTONIX Take 1 tablet (40 mg total) by mouth 2 (two) times daily before a meal.   silver sulfADIAZINE 1 % cream Commonly known as: SILVADENE Apply 1 application topically daily.   sucralfate 1 GM/10ML suspension Commonly known as: CARAFATE Take 10 mLs (1 g total) by mouth 4 (four) times daily -  with meals and at bedtime for 12 days.               Durable Medical Equipment  (From admission, onward)           Start     Ordered   03/14/21 1540  DME 3-in-1  Once        03/14/21 1539   03/14/21 1540  DME Walker  Once       Question Answer Comment  Walker: With 5 Inch Wheels   Patient needs a walker to treat with the following condition Necrotizing fasciitis of lower leg (HCC)      03/14/21 1539   03/14/21 1540  DME standard manual wheelchair with seat cushion  Once  Comments: Patient suffers from multiple debridements of his left leg for necrotizing fasciitis, and had many of his muscles in his leg removed, which impairs their ability to perform daily activities like bathing and dressing in the home.  A walker will not resolve issue with performing activities of daily living. A wheelchair will allow patient to safely perform daily activities. Patient can safely propel the wheelchair in the home or has a caregiver who can provide assistance. Length of need 6 months . Accessories: elevating leg rests (ELRs), wheel locks, extensions and anti-tippers.   03/14/21 1539   03/12/21 1703  For home use only DME lightweight manual wheelchair with seat cushion  Once       Comments: Patient suffers from weakness which impairs their  ability to perform daily activities like bathing, dressing, grooming, and toileting in the home.  A cane or crutch will not resolve  issue with performing activities of daily living. A wheelchair will allow patient to safely perform daily activities. Patient is not able to propel themselves in the home using a standard weight wheelchair due to general weakness. Patient can self propel in the lightweight wheelchair. Length of need Lifetime. Accessories: elevating leg rests (ELRs), wheel locks, extensions and anti-tippers.   03/12/21 1703   03/12/21 1702  For home use only DME Walker rolling  Once       Question Answer Comment  Walker: With 5 Inch Wheels   Patient needs a walker to treat with the following condition Weakness      03/12/21 1702              Discharge Care Instructions  (From admission, onward)           Start     Ordered   03/15/21 0000  Discharge wound care:       Comments: Apply thin layer of silvadene to open areas, then 4x4 ace bandage. May cleanse with antibacterial soap and water.   Clean the right foot thoroughly with soap and water, rinse and pat dry. Paint the wound at the base of the little toe with Betadine, allow to air dry, then place a piece of Xeroform gauze over this and secure with foam dressing. Apply Betadine twice daily.   03/15/21 0914   03/14/21 0000  Discharge wound care:       Comments: Apply silvadene to left open areas daily and apply dressing daily as well   03/14/21 1539   03/14/21 0000  Discharge wound care:       Comments: Silvadene applied to open area daily   03/14/21 1538            DISPOSITION AND FOLLOW-UP:  Adam James was discharged from Adam James in Serious condition. At the James follow up visit please address:  Follow-up Recommendations: Consults: Infectious disease, orthopedic surgery Labs:  CK, BMP Medications: Augmentin, doxycycline, protonix, carafate  Follow-up Appointments:   Follow-up Information     Adam James, Adam James, Adam James Follow up in 1 week(s).   Specialty: Orthopedic Surgery Contact information: [address] [place] [address] [phone]         Adam Huguenin, NP Follow up in 1 week(s).   Specialty: Orthopedic Surgery Contact information: [address] [place] [address] [phone]         Jobe Gibbon, [facility] Follow up.   Specialty: Home Health Services Why: HHRN,HHPT Contact information: [address] [place] [address] [place] [address] [phone]  [facility] Follow up.   Why: please schedule apt Contact information: [address] [place] [address]        [facility] Follow up in 2 week(s).   Why: James follow up, establish with new PCP Contact information: [address] [facility] [address] [phone]        Doran Stabler, DO. Go on 03/24/2021.   Specialty: Internal Medicine Why: James follow up Contact information: [address] [place] [address] [phone]                 James COURSE:  Patient Summary: Adam James is a 49 y.o. 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 He first noted the wound in July and states that it progressed to where it caused him pain to bear weight on it the last few days. L foot xray showed diffuse soft tissue edema with subcutaneous soft tissue gas loculations about the dorsum of the foot, predominantly overlying the distal 4th/5th metatarsals without evidence of osteomyelitis.  Patient underwent debridement of the ulcer on 8/1 with orthopedics and was started on vanc/zosyn, however, this was switched to vanc/cefepime. Blood cultures initially ordered and grew staph epi in 2/4 bottles, likely a contaminant and this does not warrant any additional abx per pharmacy.  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 Patient underwent more extensive debridement and exploration of the anterior compartment on 8/3. In the OR, purulence was noted from the metatarsal heads to the proximal aspect of the anterior compartment with extensive necrotic fascia that was excised. The muscle in the anterior and lateral compartment was also necrotic and was pulled out as well, all consistent with MRI findings. A wound vac was also placed and continued to have serosanguinous output on 8/4 and 8/5. ID was consulted on 8/4 and switched his antibiotics from vanc/cefepime to vanc/unasyn. A prevalon boot was also placed to reduce the risk of ankle contractures. Leukocytosis continued to downtrend during the course of the admission. Patient will return to the OR on 8/8 with ortho. Ortho reported that there was still devitalized tissue, which included skin, subcutaneous tissue, and muscle. Debridement was performed and there was still viable tissue remaining. Lateral compartment of the leg demonstrated a red, dusky, muscle. Returned to the OR later on 8/12 for a repeat washout. Patient had further necrosis of the remaining muscle of the anterior lateral compartment, which was resected. Also had further resection of muscles of the medial and lateral gastrocnemius muscles. Unasyn was discontinued as the patient was experiencing a lot of nausea, which ID attributed to this antibiotic initially. Unasyn was  switched to ceftriaxone and flagyl. Tunneled IJ catheter placed on 8/15 for long term antibiotics. Unasyn re-initiated on 8/18 and ceftriaxone/flagyl were discontinued per ID. Patient to be given a dose of oritavancin prior to discharge and will be set up with antibiotics per ID.  Patient will need 6 weeks of antibiotics  from the date of his last surgery which was 8/12, thus making his antibiotic end date 9/23. Patient was advised to stay in the James for another day or two to get his IV antibiotics set up as an outpatient, however, he would like to be discharged. ID had an extensive conversation with the patient and will work with him to have an oral antibiotic regimen consisting of augmentin and doxycycline, which will finish on 9/23, although IV antibiotics are preferred. Patient will also need his tunneled cath removed as an outpatient, as this could not be done on the day of discharge.    #Right sided diabetic foot ulcer Patient states that he first noticed the wound on his right foot in February and has been trying to take care of it himself since then. He does not endorse any pain in this foot, however, he does have reduced sensation because of his diabetic neuropathy. Pulses are palpable and the wound does not appear to be actively infected at this time. Wound care was consulted and kept the wound wrapped in gauze.   #Type 2 Diabetes with diabetic neuropathy HbA1c 8.8%. At home the patient is on metformin  BID and glyburide  BID. He was started on sliding scale insulin upon admission. His blood sugar goal is <180 to help promote wound healing. Patient does endorse numbness/tingling in his feet. Added lantus 10 U qhs on 8/2 along with SSI on 8/2. Sugars were <200 however, were not quite at goal so lantus was increased to 12U on 8/3. Sugars continued to run in the 200s and thus long acting insulin was increased on 8/5 to 15U. Added 5U Novolog TID with meals on 8/6 for further mealtime coverage  and increasd to 8U on 8/7. Blood sugars remained <180. Will be discharged on home regimen.  #Chronic HFrEF #Ischemic heart disease Echo on 02/20/21 showed EF of 30-35%, LV regional wall motion abnormalities with concentric hypertrophy, grade I diastolic dysfunction, and aortic dilatation. Patient denies any chest pain, SOB, orthopnea, or lower extremity edema. BNP 100 and patient appears euvolemic upon examination. Patient was supposed to have a CABG today, however, this is on hold until his foot infection resolves. EKG upon admission showed no signs of active ischemia. Continued aspirin and statin during admission. Held metoprolol, irbesartan, and amlodipine in the setting of hypotension. Metoprolol restarted at 12.5mg  on 8/4 and was increased to his home dosage of  on 8/5. Considered restarting irbesartan as his blood pressure was high, however, this was still held in the setting of worsening renal function. Will be resumed on discharge as patient's kidney function has improved.  #Acute on chronic renal failure #Acute tubular necrosis Patient has CKD IIIa at his baseline and was noted to have worsening of his kidney function likely in the setting of dehydration due to reduced oral intake. UA without any signs of infection, however, there are hyaline casts. He was given IV fluids and renal function has improved, BUN/Cr 32/2.16 this morning. Patient was initially given vanc/zosyn post-operatively, however, we have discontinued zosyn due to its nephrotoxic effects with vanc.  Adam James urine was first reported by patient on 8/6. No dysuria to suggest UTI. UA and CK level checked to eval for pigment. Patient continues to have brown urine on 8/7. He denies dysuria or any other urinary symptoms. UA on 8/6 significant for >300 protein, >50 RBCs, no leukocyte esterase or nitrites. CK level within normal limits. Urine protein/creatinine ratio result below reportable range. Repeat UA showing no signs  of infection,  but granular casts were noted. IV fluids started on 8/8 for ATN and monitored patient for signs of volume overload. Creatinine continue to rise on 8/9, up to 2.88. Did not start fluids as patient's weight is up, likely from the volume of fluids he has received. Creatinine up to 3.42 on 8/10 and remained stable the next day. Improved to 2.24 on 8/16, although his BUN is rising. Rise in BUN could be secondary to possible GI bleed. BUN and creatinine improved to 8/18 to 30/1.74 respecitvely. Creatinine improved even further on the day of discharge to 1.6.   #Severe erosive esophagitis Patient started coughing up coffee ground sputum with some bright red blood noted the morning of 8/16. Also had a Hb drop to 6.6 and was given 1 unit PRBC. This is likely secondary to a cushing ulcer secondary to stress on his body. Started on protonix 40mg  IV BID and GI consulted. Repeat H&H with Hb of 6.7, another unit PRBC was ordered, along with a unit of platelets. EGD on 8/17 morning showed severe erosive esophagitis with ulceration, with no active bleeding noted but a large clot was noted on the esophagus. Stomach and duodenum appeared normal. Started on protonix 40mg  BID PO and carafate x2 weeks. Hb 6.9 the morning of 8/18 and he was transfused with 1 more unit of RBCs and platelets. Hb stable on the day of discharge to 8. Resumed normal diet and will be discharged with carafate and PPI.   DISCHARGE INSTRUCTIONS:   Discharge Instructions     Call MD for:  redness, tenderness, or signs of infection (pain, swelling, redness, odor or green/yellow discharge around incision site)   Complete by: As directed    Call MD for:  redness, tenderness, or signs of infection (pain, swelling, redness, odor or green/yellow discharge around incision site)   Complete by: As directed    Call MD for:  temperature >100.4   Complete by: As directed    Diet - low sodium heart healthy   Complete by: As directed    Discharge wound care:    Complete by: As directed    Apply silvadene to left open areas daily and apply dressing daily as well   Discharge wound care:   Complete by: As directed    Silvadene applied to open area daily   Discharge wound care:   Complete by: As directed    Apply thin layer of silvadene to open areas, then 4x4 ace bandage. May cleanse with antibacterial soap and water.   Clean the right foot thoroughly with soap and water, rinse and pat dry. Paint the wound at the base of the little toe with Betadine, allow to air dry, then place a piece of Xeroform gauze over this and secure with foam dressing. Apply Betadine twice daily.   Increase activity slowly   Complete by: As directed    Increase activity slowly   Complete by: As directed        SUBJECTIVE:  Adam James was seen and evaluated on the day of discharge. He is able to bear weight on his left lower extremity, with the cam boot. He denies any chest pain, palpitations, or shortness of breath. The patient was advised to stay in the James to continue getting IV antibiotics, so we could arrange for this to be done in the outpatient setting, however, he is adamant that he would like to leave.   Discharge Vitals:   BP 126/83 (BP Location: Left Arm)  Pulse 88   Temp 98.3 F (36.8 C) (Oral)   Resp 16   Ht  (1.905 m)   Wt 123.4 kg   SpO2 96%   BMI 34.00 kg/m   OBJECTIVE:  Constitutional: well-appearing middle-aged male resting in bed , in no acute distress CV: regular rate and rhythm, S1 and S2 present, no m/r/g Pulmonary: normal work of breathing on room air, lungs clear to auscultation bilaterally Abdomen: soft, nondistended, nontender, +BS. 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 No obvious rash or lesions; sacral edema present  Neuro: AAOx4, no focal deficits noted.  Pertinent Labs,  Studies, and Procedures:  CBC Latest Ref Rng & Units 03/15/2021 03/14/2021 03/13/2021  WBC 4.0 - 10.5 K/uL 7.6 8.0 9.1  Hemoglobin 13.0 - 17.0 g/dL 7.8(L) 8.0(L) 7.7(L)  Hematocrit 39.0 - 52.0 % 23.8(L) 23.7(L) 23.3(L)  Platelets 150 - 400 K/uL 222 238 242    CMP Latest Ref Rng & Units 03/15/2021 03/14/2021 03/13/2021  Glucose 70 - 99 mg/dL 161(W) 960(A) 540(J)  BUN 6 - 20 mg/dL 15 18 81(X)  Creatinine 0.61 - 1.24 mg/dL 9.14(N) 8.29(F) 6.21(H)  Sodium 135 - 145 mmol/L 130(L) 133(L) 133(L)  Potassium 3.5 - 5.1 mmol/L 3.8 3.4(L) 3.3(L)  Chloride 98 - 111 mmol/L 99 100 99  CO2 22 - 32 mmol/L Calcium 8.9 - 10.3 mg/dL 7.2(L) 7.5(L) 7.3(L)  Total Protein 6.5 - 8.1 g/dL 0.8(M) - -  Total Bilirubin 0.3 - 1.2 mg/dL 0.4 - -  Alkaline Phos 38 - 126 U/L 49 - -  AST 15 - 41 U/L 25 - -  ALT 0 - 44 U/L 17 - -   FOLLOW-UP INSTRUCTIONS:  Thank you for allowing [place] to be part of your care. You were hospitalized for Gangrene of left foot (HCC).  Please follow up with the following providers: A. Adam James, Adam James, Adam James Orthopedic Surgery [phone]  [address] [place] [address]  Next Steps: Follow up in 1 week(s)    B. Adam Huguenin, NP Orthopedic Surgery [phone]   [facility] [address] [place] [address]  Next Steps: Follow up in 1 week(s)    C. [facility], [facility] [phone]   [address] [place] [address]  Next Steps: Follow up    Instructions: [facility]  [address] [place] [address]  Next Steps: Follow up    Instructions: please schedule apt [facility]  [phone]  [address] [place] [address]  Next Steps: Follow up in 2 week(s)   Instructions: James follow up, establish with new PCP Doran Stabler, DO Internal Medicine [phone]  [phone]  [address] [place] [address]  Next Steps: Go on 03/24/2021 at 1:45pm  Please note these changes made to your medications:   A. Medications to continue: Current Meds  Medication Sig   amLODipine (NORVASC) 5 MG tablet Take 1 tablet (5 mg total) by mouth daily.   amoxicillin-clavulanate (AUGMENTIN) 875-125 MG tablet Take 1 tablet by mouth 2 (two) times daily.   aspirin EC 81 MG tablet Take 1 tablet (81 mg total) by mouth daily. Swallow whole.   atorvastatin (LIPITOR) 80 MG tablet  Take 1 tablet (80 mg total) by mouth daily.   doxycycline (VIBRA-TABS) 100 MG tablet Take 1 tablet (100 mg total) by mouth 2 (two) times daily.   furosemide (LASIX) 40 MG tablet Take 1 tablet (40 mg total) by mouth daily. Take 40 mg every other day alternating with 2 tablets (80mg ) every other day (Patient taking differently: Take 40-80 mg by mouth daily. Take 40 mg every other day alternating with 2 tablets (80mg ) every other day)   glyBURIDE (DIABETA) 5 MG tablet Take 5 mg by mouth 2 (two) times daily.   irbesartan (AVAPRO) 75 MG tablet Take 1 tablet (75 mg total) by mouth daily.   metFORMIN (GLUCOPHAGE) 500 MG tablet Take 500 mg by mouth 2 (two) times daily.   metoprolol succinate (TOPROL-XL) 25 MG 24 hr tablet Take 1 tablet (25 mg total) by mouth daily.   silver sulfADIAZINE (SILVADENE) 1 % cream Apply 1 application topically daily.      B. Medications to start:    metoprolol succinate (TOPROL-XL) 24 hr tablet 50 mg, 50 mg, Oral, Daily, Atway, Rayann N, DO, 50 mg at 03/14/21 1036   pantoprazole (PROTONIX) EC tablet 40 mg, 40 mg, Oral, BID AC, , MD, 40 mg at 03/14/21 1037   silver sulfADIAZINE (SILVADENE) 1 % cream, , Topical, Daily, Adam James, Merrilyn Puma, PA, 1 application at 03/14/21 1045   sucralfate (CARAFATE) 1 GM/10ML suspension 1 g, 1 g, Oral, TID WC & HS, Adam Bali, MD, 1 g at 03/14/21 1040   Doxycycline (Vibra-tabs) 100mg  tablet twice daily for 6 total weeks  Augmentin  875-125mg  tablet twice daily for 6 total weeks    Please make sure to follow up with Dr. Merrilyn Puma on 8/29 at 1:45pm for a James follow up appt. Please also call to make an appt for a follow up with the orthopedic surgeons for a surgery follow up, as well as infectious disease to see how you are tolerating the antibiotics.   Please call our clinic if you have any questions or concerns, we may be able to help and keep you from a long and expensive emergency room wait. Our clinic and after hours phone number is [phone], the best time to call is Monday through Friday 9 am to 4 pm but there is always someone available 24/7 if you have an emergency. If you need medication refills please notify your pharmacy one week in advance and they will send [phone] a request.    Signed: Saturday, MD Internal Medicine Resident, PGY-2 [facility]  Pager: [phone] 9:22 AM, 03/15/2021

## 2021-03-14 NOTE — Progress Notes (Signed)
OT Cancellation Note  Patient Details Name: Adam James MRN: [id number] DOB: 1970-12-15   Cancelled Treatment:    Reason Eval/Treat Not Completed: Patient declined, no reason specified Communicated with RN for premedication prior to OT attempts. On entry, pt reports not getting out of bed until he has had lunch as he had not "had anything to eat" (was on liquid diet). Will follow-up as schedule permits.  Lorre Munroe 03/14/2021, 12:01 PM

## 2021-03-14 NOTE — Progress Notes (Signed)
HD#19 SUBJECTIVE:  Patient Summary: Adam James is a 50 y.o. with a pertinent PMH of diabetes, ischemic heart disease, chronic heart failure with reduced EF, and CKD IIIa who is admitted for left foot diabetic infection with necrotizing fasciitis, AKI, and severe erosive esophagitis.  Overnight Events: No acute events overnight. Evening Hb stable at 7.7  Interim History: This is hospital day 16 for Adam James who was seen and evaluated at the bedside this morning. He is ready to be discharged home, but we discussed with the patient that we must coordinate his outpatient antibiotics. The patient states that he feels well and he is still able to bear weight on his left leg. Denies any chest pain, SOB, hematuria, dysuria, or any other sxs at this time.   OBJECTIVE:  Vital Signs: Vitals:   03/13/21 1706 03/13/21 2146 03/14/21 0008 03/14/21 0610  BP: 114/75 114/81 128/83 126/77  Pulse: 85 88 91 94  Resp: 16 17 16 19   Temp: 98 F (36.7 C) 98.6 F (37 C) 98 F (36.7 C) 99 F (37.2 C)  TempSrc: Oral Oral Oral Oral  SpO2: 95% 97% 97% 95%  Weight:   120.9 kg   Height:       Supplemental O2: Room Air SpO2: 97% Filed Weights   03/11/21 0429 03/12/21 0600 03/14/21 0008  Weight: 117.4 kg 117.6 kg 120.9 kg     Intake/Output Summary (Last 24 hours) at 03/14/2021 0721 Last data filed at 03/14/2021 0615 Gross per 24 hour  Intake 2044.11 ml  Output 550 ml  Net 1494.11 ml   Net IO Since Admission: 14,764.28 mL [03/14/21 0721]  Physical Exam: Constitutional: well-appearing middle-aged male resting in bed , in no acute distress Cardiovascular: regular rate and rhythm, S1 and S2 present, no m/r/g Pulmonary/Chest: normal work of breathing on room air, lungs clear to auscultation bilaterally MSK: normal bulk and tone in RLE with palpable radial and dorsalis pedis pulses on R 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 No obvious  rash or lesions; sacral edema present  Psych: Normal mood and affect       ASSESSMENT/PLAN:  Assessment: Principal Problem:   Gangrene of left foot (HCC) Active Problems:   Cardiomyopathy (HCC)   Type 2 diabetes mellitus (HCC)   Diabetic foot ulcer (HCC)   CAD (coronary artery disease)   Necrotizing fasciitis (HCC)   Left leg cellulitis   Diabetic polyneuropathy associated with type 2 diabetes mellitus (HCC)   Severe protein-calorie malnutrition (HCC)   Gastrointestinal hemorrhage   Gastroesophageal reflux disease with esophagitis and hemorrhage   Anemia, posthemorrhagic, acute   Acute esophagitis   Plan: #LLE necrotizing fascitis s/p multiple I&D's Patient is s/p I&D on 8/3, 8/8 and 8/12 for attempted limb salvage. Patient had wound vac removed this morning as there was no further drainage. Patient remains afebrile and his white blood cell count is improved to 8 today. Patient has been working with PT/OT and is able to bear weight on the extremity with cam boot. Recommended for home health PT.  - ID consulted, appreciate recommendations  - Will receive Oritavancin today - Working with pharmacy to secure Dalvance weekly infusions (will need ride share assistance) and ceftriaxone at home  - Encourage to participate in PT/OT   #Severe erosive esophagitis with ulceration Patient started coughing up coffee ground sputum with some bright red blood noted on 8/17. He is now s/p 10 total units  of blood this admission and 2 units of platelets. Morning Hb stable at 8 and diet was advanced.  - 40 mg protonix BID and carafate x2 weeks - Regular diet today - GI consulted, appreciate recommendations  - Repeat EGD in 8 weeks  - Heparin DVT prophylaxis discontinued, SCDs currently - Can resume xarelto tomorrow if Hb stable  #Acute kidney injury on chronic kidney disease IIIa likely 2/2 acute tubular necrosis  Initially noted to have worsening renal function in setting multiple I&D's. sCr  improved to 1.6 today and GFR remains >30. BUN also improved also to 18 today.  - Continue to monitor renal function - Avoid nephrotoxic agents - Supportive care and encourage PO intake    #Acute on chronic normocytic anemia likely 2/2 acute blood loss #Hx of ischemic heart disease and HFrEF   He continues to deny any chest pain, palpitations or dyspnea at this time. Hb goal remains >8 in the setting of ischemic heart disease.  - Transfuse if Hb <8 - Metoprolol 50mg  daily    #Type II DM with diabetic neuropathy CBG's slightly elevated today, 140s-180s on current regimen. Goal for glucose levels <180. - Continue Semglee 15U + Novolog 8U + SSI with meals   Best Practice: Diet: Regular IVF: none VTE: SCD Code: Full AB: Daptomycin, ceftriaxone, flagyl Therapy Recs: SNF vs home health DISPO: Anticipated discharge to  home with home health in 1-2 days pending Medical stability.  Signature: , D.O.  Internal Medicine Resident, PGY-1 [facility]  Pager: [phone] 7:21 AM, 03/14/2021   Please contact the on call pager after 5 pm and on weekends at [phone].

## 2021-03-14 NOTE — Progress Notes (Signed)
Occupational Therapy Treatment/Discharge Patient Details Name: Adam James MRN: [id number] DOB: 1971-03-23 Today's Date: 03/14/2021    History of present illness Pt is a 50 y.o. male admitted 02/24/21 with bilateral foot wounds. Pt with L foot diabetic infection with gas gangrene, R foot diabetic ulcer. Ortho workup reports LLE diabetic insensate neuropathy with necrotizing fasciitis involving the L anterior/lateral compartments and dorsum of L foot. S/p L foot I&D 8/1. S/p additional L foot and lower leg I&D with wound vac placement 8/3, 8/8, 8/12. Pt developed bloody sputum; s/p EGD 8/17. PMH includes DM2, neuropathy, CAD, cardiomyopathy.   OT comments  In coordination with RN to disconnect IV (pt would not ambulate with IV connected), pt agreeable for OT session after lunch. Session focused on ability to safely mobilize to/from bathroom for toileting tasks with pt able to demo with Setup assist using RW. Hoped to have pt demo ability to manage cam boot though pt declines to do this with OT as he reports he "already knows" he can do that. Discussed IADL needs at home with pt reporting he feels he will have no issues making a meal at home. Pt has met some OT goals and declines to demo other goals (LB ADLs); pt declines need for further OT services, so will sign off at this time.    Follow Up Recommendations  No OT follow up    Equipment Recommendations  3 in 1 bedside commode;Other (comment) (Rolling walker; pt declines BSC)    Recommendations for Other Services      Precautions / Restrictions Precautions Precautions: Fall Required Braces or Orthoses: Other Brace Other Brace: CAM walker boot - seems appropriate for L foot, but order for R foot (MD/PA yet to clarify); 8/18- pt wearing cam boot on LLE, offloading shoe on R foot Restrictions Weight Bearing Restrictions: Yes RLE Weight Bearing: Weight bearing as tolerated LLE Weight Bearing: Weight bearing as tolerated Other  Position/Activity Restrictions: WBAT with L LE cam boot       Mobility Bed Mobility Overal bed mobility: Modified Independent                  Transfers Overall transfer level: Modified independent Equipment used: None Transfers: Sit to/from Stand Sit to Stand: Modified independent (Device/Increase time)         General transfer comment: Independent for sit to stand at bedside without AD. Modified Independent from regular toilet using grab bars and RW    Balance Overall balance assessment: Needs assistance Sitting-balance support: Feet supported Sitting balance-[name] Scale: Good     Standing balance support: Bilateral upper extremity supported;No upper extremity supported;During functional activity Standing balance-[name] Scale: Fair Standing balance comment: Fair static standing at bedside without DME, use of RW for mobility                           ADL either performed or assessed with clinical judgement   ADL Overall ADL's : Needs assistance/impaired                 Upper Body Dressing : Independent;Sitting Upper Body Dressing Details (indicate cue type and reason): to don gown around back     Toilet Transfer: Set up;Ambulation;RW;Regular Toilet;Grab bars Toilet Transfer Details (indicate cue type and reason): Setup for devices at bedside only. able to pull self from low toilet. does endorse having tub or wall to use for support at home if needed. Denies need for [facility] over toilet  Functional mobility during ADLs: Set up;Rolling walker General ADL Comments: Pt reports "no issues" in mobility to/from bathroom, toilet transfer though did demo this for OT. Pt denies any issues with CAM boot mgmt, reports he has taken it off already before and declines to show OT he can manage this ok. Collab on IADLs in the home, educated pt that he could use bag on walker to safely transport items as needed, pt declines need for this at home.     Vision    Vision Assessment?: No apparent visual deficits   Perception     Praxis      Cognition Arousal/Alertness: Awake/alert Behavior During Therapy: WFL for tasks assessed/performed Overall Cognitive Status: Within Functional Limits for tasks assessed                                 General Comments: improved affect though does benefit from education on reasoning for certain therapeutic activities        Exercises     Shoulder Instructions       General Comments RN in to assist in disconnecting IV as pt refused to mobilize with IV connected despite OT reassurance to assist with line mgmt    Pertinent Vitals/ Pain       Pain Assessment: Faces Faces Pain Scale: Hurts a little bit Pain Location: L LE with ambulation Pain Descriptors / Indicators: Sore Pain Intervention(s): Monitored during session  Home Living                                          Prior Functioning/Environment              Frequency  Min 1X/week        Progress Toward Goals  OT Goals(current goals can now be found in the care plan section)  Progress towards OT goals: Goals met/education completed, patient discharged from OT  Acute Rehab OT Goals Patient Stated Goal: to get home and hurt less OT Goal Formulation: With patient Time For Goal Achievement: 03/13/21 Potential to Achieve Goals: Good ADL Goals Pt Will Perform Lower Body Bathing: with supervision;sitting/lateral leans Pt Will Perform Lower Body Dressing: with supervision;sitting/lateral leans Pt Will Transfer to Toilet: with min guard assist;squat pivot transfer;stand pivot transfer;bedside commode Additional ADL Goal #1: Pt to demonstrate full sit to stand transfer with no more than Mod A in prep for ADL transfers  Plan Discharge plan remains appropriate    Co-evaluation                 AM-PAC OT "6 Clicks" Daily Activity     Outcome Measure   Help from another person eating meals?:  None Help from another person taking care of personal grooming?: None Help from another person toileting, which includes using toliet, bedpan, or urinal?: A Little Help from another person bathing (including washing, rinsing, drying)?: A Little Help from another person to put on and taking off regular upper body clothing?: None Help from another person to put on and taking off regular lower body clothing?: A Little 6 Click Score: 21    End of Session Equipment Utilized During Treatment: Rolling walker  OT Visit Diagnosis: Unsteadiness on feet (R26.81);Other abnormalities of gait and mobility (R26.89);Pain Pain - Right/Left: Left Pain - part of body: Ankle and joints of foot  Activity Tolerance Patient tolerated treatment well   Patient Left in bed;with call bell/phone within reach   Nurse Communication Mobility status        Time: 7322-0254 OT Time Calculation (min): 21 min  Charges: OT General Charges $OT Visit: 1 Visit OT Treatments $Self Care/Home Management : 8-22 mins  Adam James, OTR/L Acute Rehab Services Office: [phone]    Adam James 03/14/2021, 1:26 PM

## 2021-03-14 NOTE — Discharge Instructions (Addendum)
FOLLOW-UP INSTRUCTIONS:  Thank you for allowing [place] to be part of your care. You were hospitalized for Gangrene of left foot (HCC).  Please follow up with the following providers: A. Persons, [name], [name] [facility] [phone]  [address] [place] [address]  Next Steps: Follow up in 1 week(s)    B. [name], NP Orthopedic Surgery [phone]   [facility] [address] [place] [address]  Next Steps: Follow up in 1 week(s)    C. [facility], [facility] [phone]   [address] [place] [address]  Next Steps: Follow up    Instructions: [facility] [place]  [address] [place] [address]  Next Steps: Follow up    Instructions: please schedule apt [facility]  [phone]  [address] [place] [address]  Next Steps: Follow up in 2 week(s)   Instructions: hospital follow up, establish with new PCP Doran Stabler, DO Internal Medicine [phone]  [address] [place] [address]  Next Steps: Go on 03/24/2021 at 1:45pm  Please note these changes made to your medications:   A. Medications to continue: Current Meds  Medication Sig   amLODipine (NORVASC) 5 MG tablet Take 1 tablet (5 mg total) by mouth daily.   amoxicillin-clavulanate (AUGMENTIN) 875-125 MG tablet Take 1 tablet by mouth 2 (two) times daily.   aspirin EC 81 MG tablet Take 1 tablet (81 mg total) by mouth daily. Swallow whole.   atorvastatin (LIPITOR) 80 MG tablet Take 1 tablet (80 mg total) by mouth daily.   doxycycline (VIBRA-TABS) 100 MG tablet Take 1 tablet (100 mg total) by mouth 2 (two) times daily.   furosemide (LASIX) 40 MG tablet Take 1 tablet (40 mg total) by mouth daily. Take 40 mg every other day alternating with 2 tablets (80mg ) every other day (Patient taking  differently: Take 40-80 mg by mouth daily. Take 40 mg every other day alternating with 2 tablets (80mg ) every other day)   glyBURIDE (DIABETA) 5 MG tablet Take 5 mg by mouth 2 (two) times daily.   irbesartan (AVAPRO) 75 MG tablet Take 1 tablet (75 mg total) by mouth daily.   metFORMIN (GLUCOPHAGE) 500 MG tablet Take 500 mg by mouth 2 (two) times daily.   metoprolol succinate (TOPROL-XL) 25 MG 24 hr tablet Take 1 tablet (25 mg total) by mouth daily.   silver sulfADIAZINE (SILVADENE) 1 % cream Apply 1 application topically daily.      B. Medications to start:    metoprolol succinate (TOPROL-XL) 24 hr tablet 50 mg, 50 mg, Oral, Daily, Chip Canepa N, DO, 50 mg at 03/14/21 1036   pantoprazole (PROTONIX) EC tablet 40 mg, 40 mg, Oral, BID AC, , MD, 40 mg at 03/14/21 1037   silver sulfADIAZINE (SILVADENE) 1 % cream, , Topical, Daily, Persons, Merrilyn Puma, PA, 1 application at 03/14/21 1045   sucralfate (CARAFATE) 1 GM/10ML suspension 1 g, 1 g, Oral, TID WC & HS, [name], MD, 1 g at 03/14/21 1040   Doxycycline (Vibra-tabs) 100mg  tablet twice daily for 6 total weeks  Augmentin 875-125mg  tablet twice daily for 6 total weeks    Please make sure to follow up with Dr. Merrilyn Puma on 8/29 at 1:45pm for a hospital follow up appt. Please also call to make an appt for a follow up with the orthopedic surgeons  for a surgery follow up, as well as infectious disease to see how you are tolerating the antibiotics.   Please call our clinic if you have any questions or concerns, we may be able to help and keep you from a long and expensive emergency room wait. Our clinic and after hours phone number is [phone], the best time to call is Monday through Friday 9 am to 4 pm but there is always someone available 24/7 if you have an emergency. If you need medication refills please notify your pharmacy one week in advance and they will send [place] a request.

## 2021-03-14 NOTE — Progress Notes (Signed)
[facility] for Infectious Disease  Date of Admission:  02/24/2021     Total days of antibiotics 18   Daptomycin   Unasyn 8/18 (resumed after alternative GI sx cause realized)           ASSESSMENT: Adam James is a 50 y.o. 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 He has undergone 3 debridements last on 8/12. Intraoperative cultures as expected are polymicrobial with MRSA (R-bactrim, cipro), streptococcus agalactiae, Eikenella corrodens and clostridium species.   Inflammatory markers way down compared to admission (ESR >140 >> 25). I think given he has had thorough debridement the anaerobes we can not worry as much about. Stop anaerobic coverage for regimen going forward.   He shared frustrations today about being told different things about going home and timing, etc. He said he would not leave AMA and he apologized for even bringing that up. I shared with him the ideal plan would be to coordinate with home health regarding Ceftriaxone assistance and possibly home health as well Carolynn Sayers will touch base with Neoma Laming re: [facility] to see about possible [facility] for nursing services.  He will need assistance however to get to infusion clinic for labs/dressing changes and Dalvance weekly if this is not secured.      PLAN: Will give a dose of Oritavancin today and plan for OPAT financial assistance with ceftriaxone    ADDENDUM: 3:10 PM   Unfortunately Adam James would not provide any information to detail what the home health team needed for financial assistance. We had a very long talk, mostly listening to his frustrations with the medical system, Medicaid/disability challenges and other things. He at first declined any treatment at all going forward but let me talk him into at least doing oral therapy with doxycycline 100 mg BID with food and augmentin 875/125 mg BID.   Will follow up with him in clinic in 2  weeks - can work with arranging transportation ahead of time for him to make it to the office.     Principal Problem:   Gangrene of left foot (HCC) Active Problems:   Cardiomyopathy ([place])   Type 2 diabetes mellitus (HCC)   Diabetic foot ulcer (HCC)   CAD (coronary artery disease)   Necrotizing fasciitis (HCC)   Left leg cellulitis   Diabetic polyneuropathy associated with type 2 diabetes mellitus (HCC)   Severe protein-calorie malnutrition (HCC)   Gastrointestinal hemorrhage   Gastroesophageal reflux disease with esophagitis and hemorrhage   Anemia, posthemorrhagic, acute   Acute esophagitis    acetaminophen  1,000 mg Oral Q8H   aspirin EC  81 mg Oral Daily   docusate sodium  100 mg Oral BID   insulin aspart  0-9 Units Subcutaneous TID WC   insulin aspart  8 Units Subcutaneous TID WC   insulin glargine-yfgn  15 Units Subcutaneous QHS   metoprolol succinate  50 mg Oral Daily   pantoprazole  40 mg Oral BID AC   potassium chloride  40 mEq Oral BID   silver sulfADIAZINE   Topical Daily   sucralfate  1 g Oral TID WC & HS    SUBJECTIVE: Waiting for his leg to be re-wrapped. No further problems with nausea/vomiting.    Review of Systems: Review of Systems  Constitutional:  Negative for chills and fever.  HENT:  Negative for tinnitus.   Eyes:  Negative for blurred vision and photophobia.  Respiratory:  Negative for cough and sputum production.   Cardiovascular:  Positive for leg swelling. Negative for chest pain.  Gastrointestinal:  Negative for diarrhea, nausea and vomiting.  Genitourinary:  Negative for dysuria.  Musculoskeletal:  Positive for joint pain (L foot pain).  Skin:  Negative for rash.  Neurological:  Negative for headaches.    Allergies  Allergen Reactions   Morphine Itching    OBJECTIVE: Vitals:   03/13/21 1706 03/13/21 2146 03/14/21 0008 03/14/21 0610  BP: 114/75 114/81 128/83 126/77  Pulse: 85 88 91 94  Resp: '16 17 16 19  ' Temp: 98 F (36.7 C)  98.6 F (37 C) 98 F (36.7 C) 99 F (37.2 C)  TempSrc: Oral Oral Oral Oral  SpO2: 95% 97% 97% 95%  Weight:   120.9 kg   Height:       Body mass index is 33.31 kg/m.  Physical Exam Constitutional:      Appearance: Normal appearance. He is not ill-appearing.  Eyes:     General: No scleral icterus. Cardiovascular:     Rate and Rhythm: Normal rate and regular rhythm.     Pulses: Normal pulses.     Heart sounds: Normal heart sounds.  Pulmonary:     Effort: Pulmonary effort is normal.     Breath sounds: Normal breath sounds.  Abdominal:     General: Bowel sounds are normal. There is no distension.     Palpations: Abdomen is soft. There is no fluid wave, hepatomegaly or splenomegaly.     Tenderness: There is no abdominal tenderness.  Musculoskeletal:        General: No swelling.     Right lower leg: No edema.     Left lower leg: No edema.       Legs:     Comments: Surgical incision clean. Some bleeding on gauze noted. Generalized swelling post op but no cellulitis.   Skin:    Capillary Refill: Capillary refill takes less than 2 seconds.     Coloration: Skin is not jaundiced.     Findings: No rash.  Neurological:     Mental Status: He is alert and oriented to person, place, and time.  Psychiatric:        Behavior: Behavior normal.        Judgment: Judgment normal.    Lab Results Lab Results  Component Value Date   WBC 8.0 03/14/2021   HGB 8.0 (L) 03/14/2021   HCT 23.7 (L) 03/14/2021   MCV 88.8 03/14/2021   PLT 238 03/14/2021    Lab Results  Component Value Date   CREATININE 1.61 (H) 03/14/2021   BUN 18 03/14/2021   NA 133 (L) 03/14/2021   K 3.4 (L) 03/14/2021   CL 100 03/14/2021   CO2 28 03/14/2021    Lab Results  Component Value Date   ALT 13 02/24/2021   AST 20 02/24/2021   ALKPHOS 97 02/24/2021   BILITOT 0.7 02/24/2021     Microbiology: No results found for this or any previous visit (from the past 240 hour(s)).   Janene Madeira, MSN,  NP-C [facility] for Infectious Disease [email or web address] Pager: [phone] Office: [phone] [place]: [phone]

## 2021-03-14 NOTE — Progress Notes (Signed)
PHARMACY CONSULT NOTE FOR:  OUTPATIENT  PARENTERAL ANTIBIOTIC THERAPY (OPAT)  Indication: Osteomyelitis/DFU Regimen: Rocephin 2g IV q24h End date: 04/18/21  IV antibiotic discharge orders are pended. To discharging provider:  please sign these orders via discharge navigator,  Select New Orders & click on the button choice - Manage This Unsigned Work.     Thank you for allowing pharmacy to be a part of this patient's care.   Exie Chrismer S. Merilynn Finland, PharmD, BCPS Clinical Staff Pharmacist [email or web address] Pasty Spillers 03/14/2021, 1:30 PM

## 2021-03-15 LAB — COMPREHENSIVE METABOLIC PANEL
ALT: 17 U/L (ref 0–44)
AST: 25 U/L (ref 15–41)
Albumin: 1.5 g/dL — ABNORMAL LOW (ref 3.5–5.0)
Alkaline Phosphatase: 49 U/L (ref 38–126)
Anion gap: 4 — ABNORMAL LOW (ref 5–15)
BUN: 15 mg/dL (ref 6–20)
CO2: 27 mmol/L (ref 22–32)
Calcium: 7.2 mg/dL — ABNORMAL LOW (ref 8.9–10.3)
Chloride: 99 mmol/L (ref 98–111)
Creatinine, Ser: 1.63 mg/dL — ABNORMAL HIGH (ref 0.61–1.24)
GFR, Estimated: 52 mL/min — ABNORMAL LOW (ref >60)
Glucose, Bld: 193 mg/dL — ABNORMAL HIGH (ref 70–99)
Potassium: 3.8 mmol/L (ref 3.5–5.1)
Sodium: 130 mmol/L — ABNORMAL LOW (ref 135–145)
Total Bilirubin: 0.4 mg/dL (ref 0.3–1.2)
Total Protein: 4.5 g/dL — ABNORMAL LOW (ref 6.5–8.1)

## 2021-03-15 LAB — CBC
HCT: 23.8 % — ABNORMAL LOW (ref 39.0–52.0)
Hemoglobin: 7.8 g/dL — ABNORMAL LOW (ref 13.0–17.0)
MCH: 29.5 pg (ref 26.0–34.0)
MCHC: 32.8 g/dL (ref 30.0–36.0)
MCV: 90.2 fL (ref 80.0–100.0)
Platelets: 222 10*3/uL (ref 150–400)
RBC: 2.64 MIL/uL — ABNORMAL LOW (ref 4.22–5.81)
RDW: 15.3 % (ref 11.5–15.5)
WBC: 7.6 10*3/uL (ref 4.0–10.5)
nRBC: 0 % (ref 0.0–0.2)

## 2021-03-15 MED ORDER — CHLORHEXIDINE GLUCONATE CLOTH 2 % EX PADS
6.0000 | MEDICATED_PAD | Freq: Every day | CUTANEOUS | Status: DC
  Administered 2021-03-15: 6 via TOPICAL

## 2021-03-15 NOTE — TOC CM/SW Note (Signed)
Made aware by intake with [facility] that home health orders were not in. It was over 2 hours after patient had discharged. Spoke with Neysa Bonito with [facility] and MD Hospitalist on Pensions consultant.   Christy with [facility] made aware to contact the hospitalist office on Monday for home health orders. Patient does not have an active PCP.   Raiford Noble, MSN, RN,BSN [facility] Case Manager [phone]

## 2021-03-15 NOTE — TOC Transition Note (Signed)
Transition of Care [facility]) - CM/SW Discharge Note   Patient Details  Name: Adam James MRN: [id number] Date of Birth: Dec 17, 1970  Transition of Care [facility] CM/SW Contact:  Kallie Locks, RN Phone Number: [phone] 03/15/2021, 9:51 AM   Clinical Narrative:  Patient to transition home today.  Confirmed with patient's nurse that Mr. Plain has [facility] pharmacy medications for home, rolling walker is in room, pt will go home on po antibiotics, and no wound vac is needed.  Spoke with patient who does not need transportation home. Understands wheelchair will be delivered to his home.   Spoke with Neysa Bonito with [facility] who confirms [facility] will follow for [facility] PT/RN services.   No further TOC needs assessed.     Final next level of care: Home w Home Health Services Barriers to Discharge: Continued Medical Work up   Patient Goals and CMS Choice Patient states their goals for this hospitalization and ongoing recovery are:: return home CMS Medicare.gov Compare Post Acute Care list provided to:: Patient Choice offered to / list presented to : Patient  Discharge Placement                       Discharge Plan and Services   Discharge Planning Services: CM Consult Post Acute Care Choice: Durable Medical Equipment, Home Health          DME Arranged: Walker rolling, Community education officer wheelchair with seat cushion DME Agency: [employer] Date DME Agency Contacted: 03/13/21 Time DME Agency Contacted: 0830 Representative spoke with at DME Agency: Silvio Pate HH Arranged: RN, PT HH Agency: [facility] Date [facility] Agency Contacted: 03/13/21 Time HH Agency Contacted: 1653 Representative spoke with at [facility] Agency: Neysa Bonito  Social Determinants of Health (SDOH) Interventions     Readmission Risk Interventions No flowsheet data found.

## 2021-03-15 NOTE — Progress Notes (Addendum)
Discharge AVS copy given to PT after review of discharge information explained for which PT acknowledged and had opportunity to ask question for which he had none. Bag of supplies for dressing changed given to PT. Daily dressing change completed by RN. PT demonstrated 03/14/2021 ability to do dressing change himself. Pharmacy bag with medicines also sent home with PT. Walker that was supplied as well as his clothing, water bottle, cell phone with charger and glasses in PT's possession. PIV removed. PT to have tunneled central line removed on an outpatient setting by IR. Dressing had been changed last night with bio patch in place. Both lumens easily aspirated with blood and were flushed with NS and then clamped. Information and phone numbers were highlighted on AVS. PT was transported to daughter's vehicle by NT in no outward acute distress.

## 2021-03-17 ENCOUNTER — Telehealth (HOSPITAL_COMMUNITY): Payer: Self-pay

## 2021-03-17 ENCOUNTER — Telehealth: Payer: Self-pay | Admitting: Cardiology

## 2021-03-17 ENCOUNTER — Other Ambulatory Visit (HOSPITAL_COMMUNITY): Payer: Self-pay

## 2021-03-17 NOTE — Telephone Encounter (Signed)
*  STAT* If patient is at the pharmacy, call can be transferred to refill team.   1. Which medications need to be refilled? (please list name of each medication and dose if known) furosemide (LASIX) 40 MG tablet  2. Which pharmacy/location (including street and city if local pharmacy) is medication to be sent to? [facility]   3. Do they need a 30 day or 90 day supply? 90 day  Patient is completely out of medication

## 2021-03-17 NOTE — Telephone Encounter (Signed)
Called to schedule cath removal, no answer, left vm. AW  

## 2021-03-18 NOTE — Telephone Encounter (Signed)
Called pt to verify pharmacy. No answer, left a message.

## 2021-03-19 MED ORDER — FUROSEMIDE 40 MG PO TABS: 40.0000 mg | ORAL_TABLET | Freq: Every day | ORAL | 2 refills | Status: DC

## 2021-03-19 NOTE — Telephone Encounter (Signed)
Called patient to verify pharmacy.  LVMTCB with pharmacy information for refills of Lasix.

## 2021-03-19 NOTE — Telephone Encounter (Signed)
Refill of Lasix 40 mg sent to [facility] [place] per the patient's recommendations.

## 2021-03-22 ENCOUNTER — Emergency Department (HOSPITAL_COMMUNITY): Payer: Self-pay

## 2021-03-22 ENCOUNTER — Inpatient Hospital Stay (HOSPITAL_COMMUNITY)
Admission: EM | Admit: 2021-03-22 | Discharge: 2021-03-29 | DRG: 280 | Disposition: A | Discharge status: Home / Self Care | Payer: Self-pay | Attending: Internal Medicine | Admitting: Internal Medicine

## 2021-03-22 ENCOUNTER — Encounter (HOSPITAL_COMMUNITY): Payer: Self-pay | Admitting: Emergency Medicine

## 2021-03-22 DIAGNOSIS — I214 Non-ST elevation (NSTEMI) myocardial infarction: Secondary | ICD-10-CM

## 2021-03-22 DIAGNOSIS — M726 Necrotizing fasciitis: Secondary | ICD-10-CM | POA: Diagnosis present

## 2021-03-22 DIAGNOSIS — E1122 Type 2 diabetes mellitus with diabetic chronic kidney disease: Secondary | ICD-10-CM | POA: Diagnosis present

## 2021-03-22 DIAGNOSIS — E876 Hypokalemia: Secondary | ICD-10-CM | POA: Diagnosis present

## 2021-03-22 DIAGNOSIS — I252 Old myocardial infarction: Secondary | ICD-10-CM

## 2021-03-22 DIAGNOSIS — E1142 Type 2 diabetes mellitus with diabetic polyneuropathy: Secondary | ICD-10-CM | POA: Diagnosis present

## 2021-03-22 DIAGNOSIS — N179 Acute kidney failure, unspecified: Secondary | ICD-10-CM | POA: Diagnosis present

## 2021-03-22 DIAGNOSIS — I878 Other specified disorders of veins: Secondary | ICD-10-CM | POA: Diagnosis present

## 2021-03-22 DIAGNOSIS — Z8249 Family history of ischemic heart disease and other diseases of the circulatory system: Secondary | ICD-10-CM

## 2021-03-22 DIAGNOSIS — E1152 Type 2 diabetes mellitus with diabetic peripheral angiopathy with gangrene: Secondary | ICD-10-CM | POA: Diagnosis present

## 2021-03-22 DIAGNOSIS — A480 Gas gangrene: Secondary | ICD-10-CM | POA: Diagnosis present

## 2021-03-22 DIAGNOSIS — I5042 Chronic combined systolic (congestive) and diastolic (congestive) heart failure: Secondary | ICD-10-CM

## 2021-03-22 DIAGNOSIS — I429 Cardiomyopathy, unspecified: Secondary | ICD-10-CM | POA: Diagnosis present

## 2021-03-22 DIAGNOSIS — L97521 Non-pressure chronic ulcer of other part of left foot limited to breakdown of skin: Secondary | ICD-10-CM | POA: Diagnosis present

## 2021-03-22 DIAGNOSIS — I509 Heart failure, unspecified: Secondary | ICD-10-CM

## 2021-03-22 DIAGNOSIS — Z20822 Contact with and (suspected) exposure to covid-19: Secondary | ICD-10-CM | POA: Diagnosis present

## 2021-03-22 DIAGNOSIS — Z79899 Other long term (current) drug therapy: Secondary | ICD-10-CM

## 2021-03-22 DIAGNOSIS — E1169 Type 2 diabetes mellitus with other specified complication: Secondary | ICD-10-CM | POA: Diagnosis present

## 2021-03-22 DIAGNOSIS — E11628 Type 2 diabetes mellitus with other skin complications: Secondary | ICD-10-CM | POA: Diagnosis present

## 2021-03-22 DIAGNOSIS — Z885 Allergy status to narcotic agent status: Secondary | ICD-10-CM

## 2021-03-22 DIAGNOSIS — Z7984 Long term (current) use of oral hypoglycemic drugs: Secondary | ICD-10-CM

## 2021-03-22 DIAGNOSIS — M869 Osteomyelitis, unspecified: Secondary | ICD-10-CM | POA: Diagnosis present

## 2021-03-22 DIAGNOSIS — I96 Gangrene, not elsewhere classified: Secondary | ICD-10-CM | POA: Diagnosis present

## 2021-03-22 DIAGNOSIS — I272 Pulmonary hypertension, unspecified: Secondary | ICD-10-CM | POA: Diagnosis present

## 2021-03-22 DIAGNOSIS — K2211 Ulcer of esophagus with bleeding: Secondary | ICD-10-CM | POA: Diagnosis present

## 2021-03-22 DIAGNOSIS — M86272 Subacute osteomyelitis, left ankle and foot: Secondary | ICD-10-CM

## 2021-03-22 DIAGNOSIS — I472 Ventricular tachycardia: Secondary | ICD-10-CM | POA: Diagnosis not present

## 2021-03-22 DIAGNOSIS — E8770 Fluid overload, unspecified: Secondary | ICD-10-CM

## 2021-03-22 DIAGNOSIS — I25110 Atherosclerotic heart disease of native coronary artery with unstable angina pectoris: Secondary | ICD-10-CM | POA: Diagnosis present

## 2021-03-22 DIAGNOSIS — E119 Type 2 diabetes mellitus without complications: Secondary | ICD-10-CM

## 2021-03-22 DIAGNOSIS — I255 Ischemic cardiomyopathy: Secondary | ICD-10-CM | POA: Diagnosis present

## 2021-03-22 DIAGNOSIS — Z955 Presence of coronary angioplasty implant and graft: Secondary | ICD-10-CM

## 2021-03-22 DIAGNOSIS — N1832 Chronic kidney disease, stage 3b: Secondary | ICD-10-CM | POA: Diagnosis present

## 2021-03-22 DIAGNOSIS — E11621 Type 2 diabetes mellitus with foot ulcer: Secondary | ICD-10-CM | POA: Diagnosis present

## 2021-03-22 DIAGNOSIS — F17210 Nicotine dependence, cigarettes, uncomplicated: Secondary | ICD-10-CM | POA: Diagnosis present

## 2021-03-22 DIAGNOSIS — Z7982 Long term (current) use of aspirin: Secondary | ICD-10-CM

## 2021-03-22 DIAGNOSIS — L97519 Non-pressure chronic ulcer of other part of right foot with unspecified severity: Secondary | ICD-10-CM | POA: Diagnosis present

## 2021-03-22 DIAGNOSIS — L97211 Non-pressure chronic ulcer of right calf limited to breakdown of skin: Secondary | ICD-10-CM | POA: Diagnosis present

## 2021-03-22 DIAGNOSIS — I130 Hypertensive heart and chronic kidney disease with heart failure and stage 1 through stage 4 chronic kidney disease, or unspecified chronic kidney disease: Principal | ICD-10-CM | POA: Diagnosis present

## 2021-03-22 DIAGNOSIS — I77819 Aortic ectasia, unspecified site: Secondary | ICD-10-CM | POA: Diagnosis present

## 2021-03-22 DIAGNOSIS — I872 Venous insufficiency (chronic) (peripheral): Secondary | ICD-10-CM | POA: Diagnosis present

## 2021-03-22 DIAGNOSIS — D500 Iron deficiency anemia secondary to blood loss (chronic): Secondary | ICD-10-CM | POA: Diagnosis present

## 2021-03-22 DIAGNOSIS — I21A1 Myocardial infarction type 2: Secondary | ICD-10-CM | POA: Diagnosis present

## 2021-03-22 DIAGNOSIS — I5043 Acute on chronic combined systolic (congestive) and diastolic (congestive) heart failure: Secondary | ICD-10-CM | POA: Diagnosis present

## 2021-03-22 DIAGNOSIS — E785 Hyperlipidemia, unspecified: Secondary | ICD-10-CM | POA: Diagnosis present

## 2021-03-22 IMAGING — CR DG CHEST 2V
2 series · 2 of 2 positions shown · non-contrast
Comparison: Chest radiograph dated [DATE]

CLINICAL DATA: Shortness of breath.

EXAM:
CHEST - 2 VIEW

[chest pa]
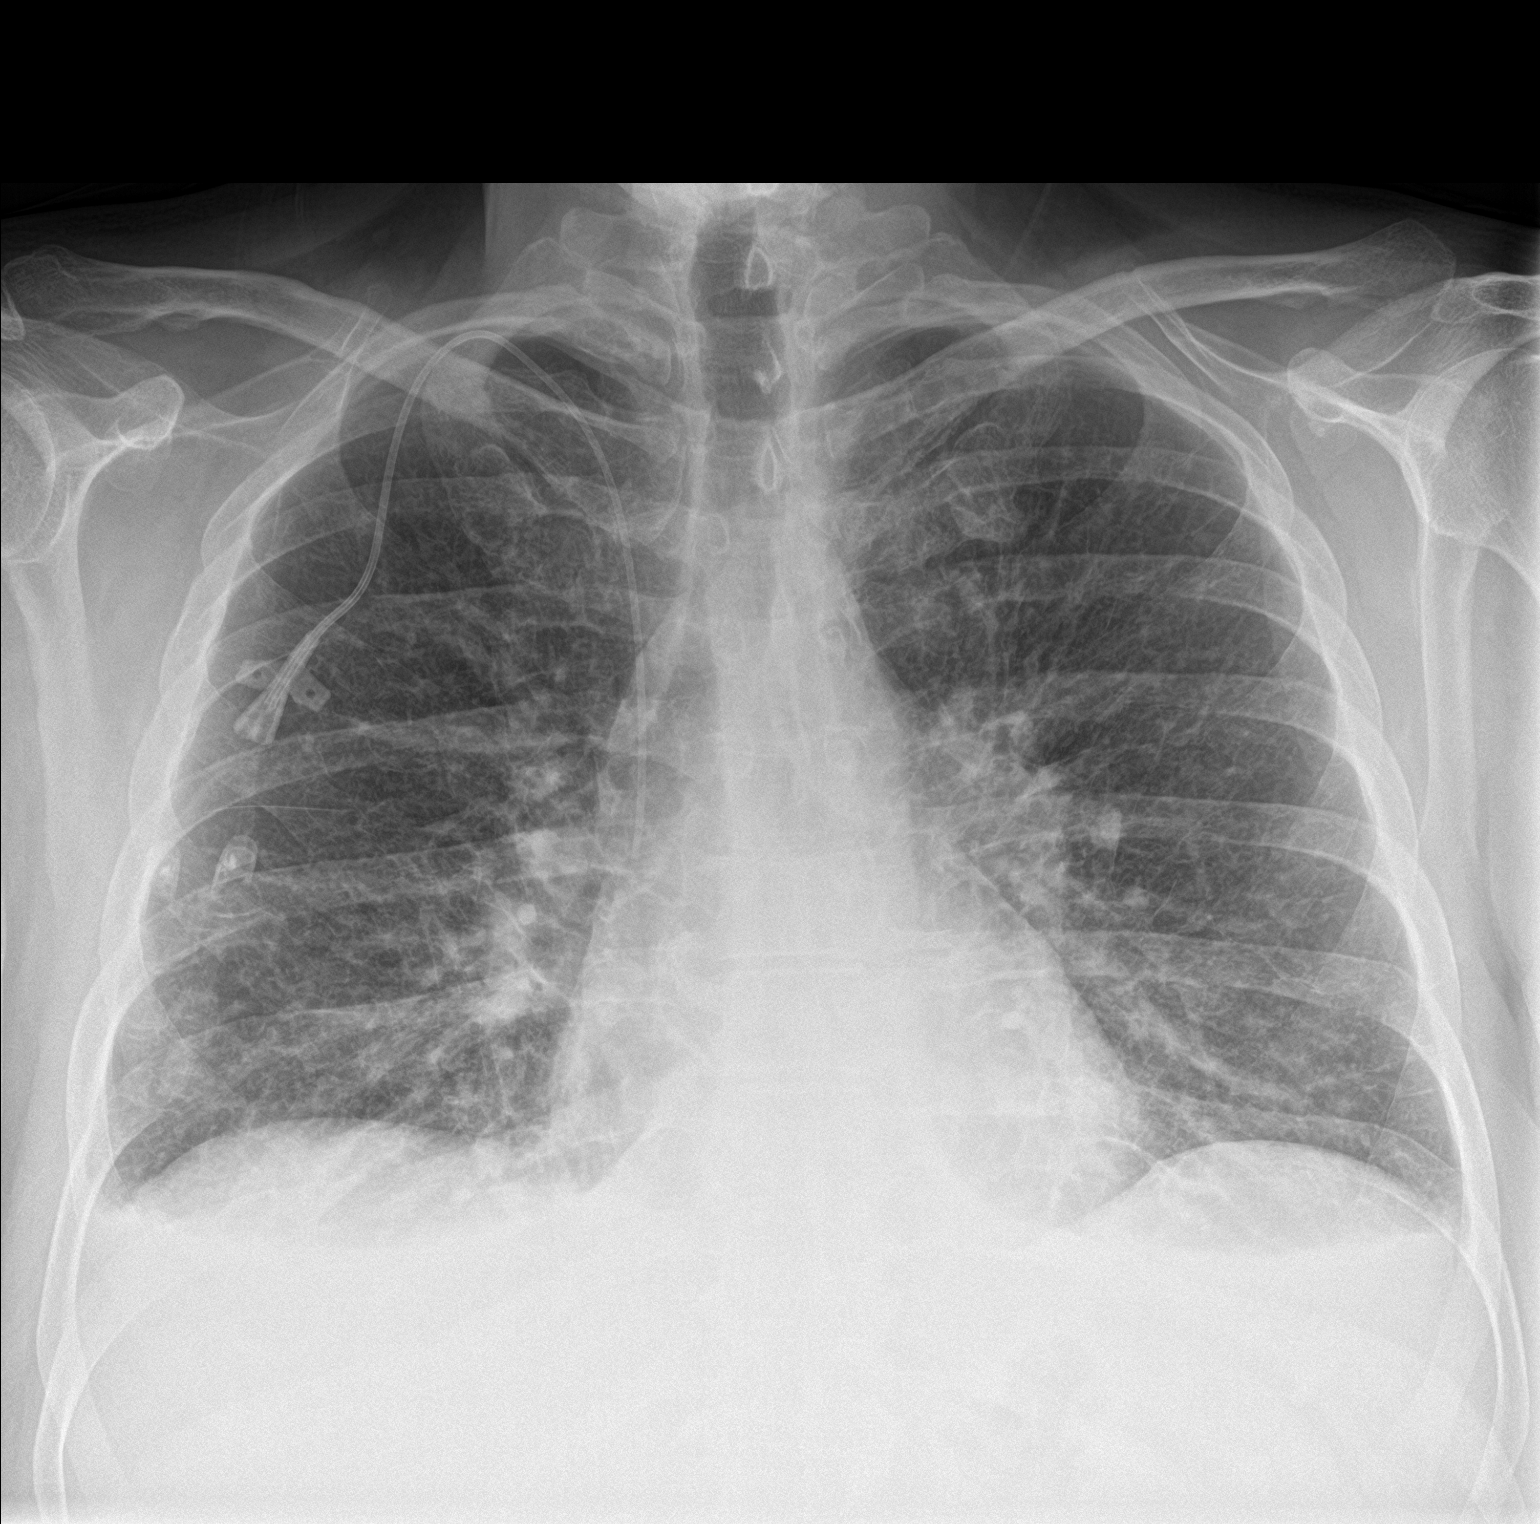

[chest lat]
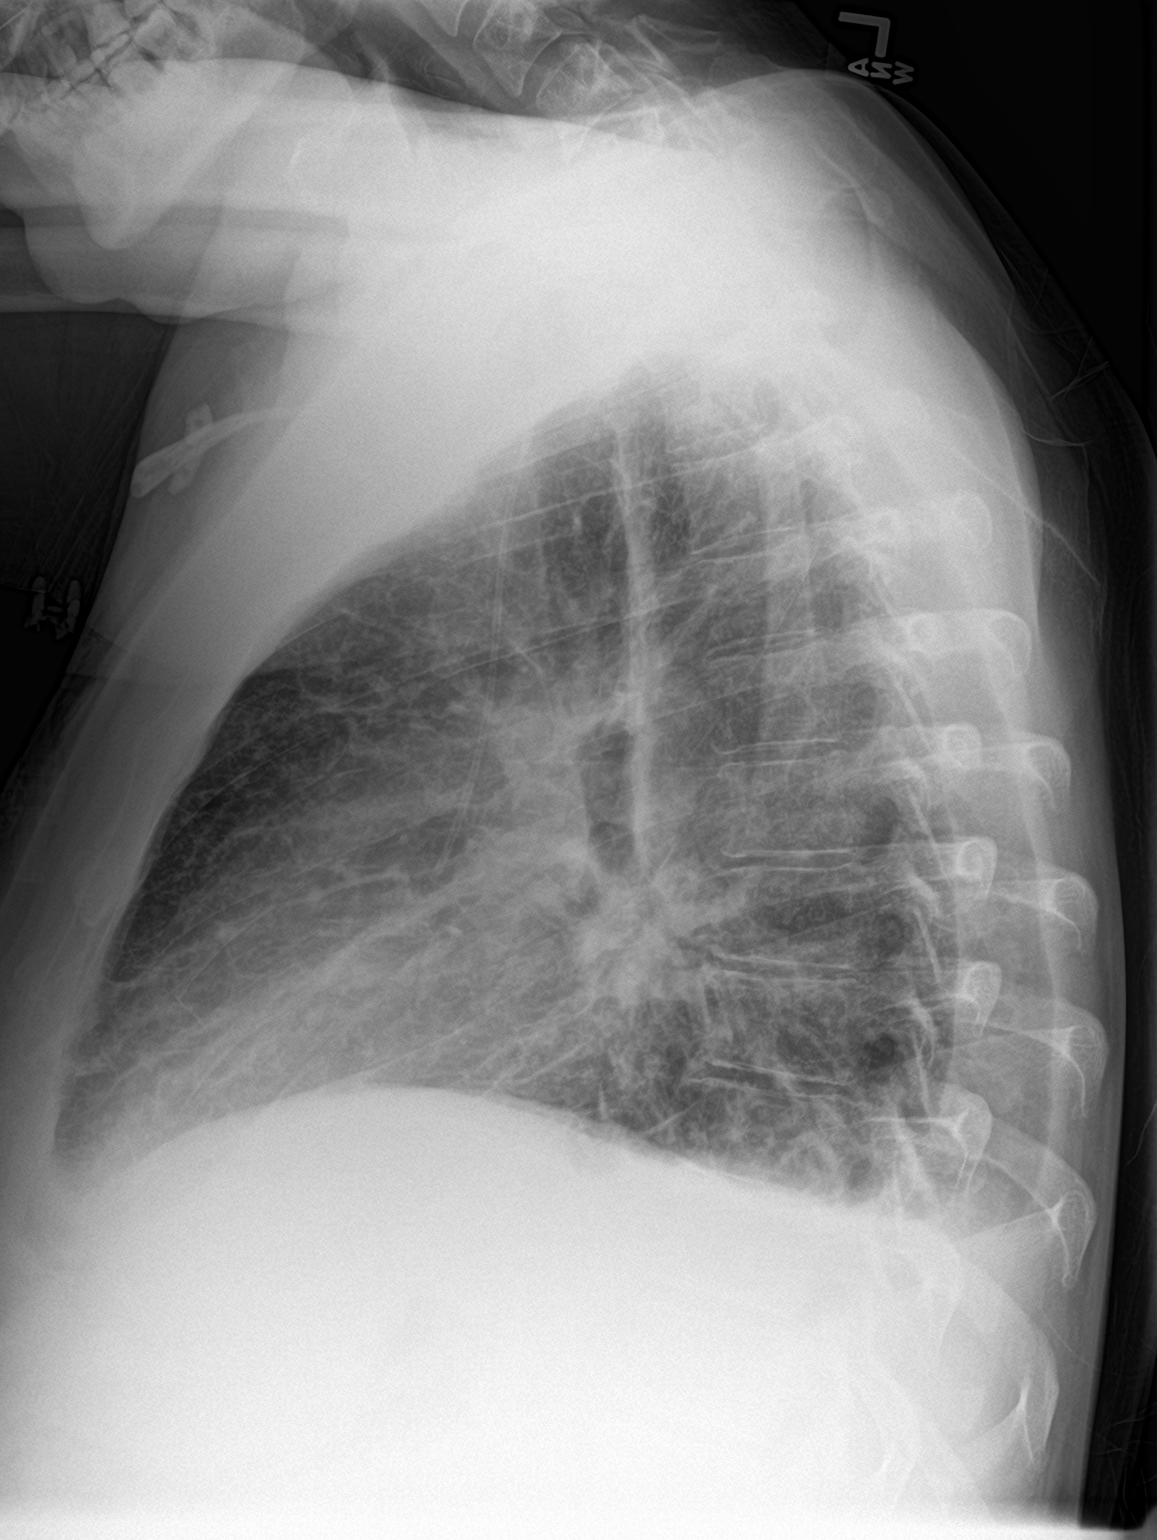

[2 of 2 positions shown; findings below may reference images not displayed]

FINDINGS: Right-sided central venous line with tip at the cavoatrial junction.
Diffuse interstitial prominence may represent edema or atypical
pneumonia. No lobar consolidation. Probable small bilateral pleural
effusions. No pneumothorax. The cardiac silhouette is within limits.
No acute osseous pathology.
IMPRESSION: Small bilateral pleural effusions and interstitial edema. Pneumonia
is not excluded.

## 2021-03-22 NOTE — ED Triage Notes (Signed)
Pt here from home with c/o sob worse when he lays back , pt had recent surgery on left leg , feeling sob for 2 days

## 2021-03-22 NOTE — ED Provider Notes (Signed)
Emergency Medicine Provider Triage Evaluation Note  Adam James , James 50 y.o. male  was evaluated in triage.  Pt complains of SOB. Worse with exertion and laying flat. Sleeps with one "extra large" pillow. Increased swelling to BLE. Taking home meds. Hx of HF. Recent surgery 2 weeks ago for nec fac to LLE. No hx of PE, DVT. Boot to LLE. No numbness, weakness. Has chronic anginal sx. Not worse than baseline  Review of Systems  Positive: SOB, chronic CP, LE edema Negative: Fever, chills, emesis, abd pain  Physical Exam  There were no vitals taken for this visit. Gen:   Awake, no distress   Resp:  Normal effort, Speaks in full sentences without difficulty MSK:   Moves extremities without difficulty, Boot to LLE. 3 + pitting edema to RLE Other:    Medical Decision Making  Medically screening exam initiated at 11:13 PM.  Appropriate orders placed.  Adam James was informed that the remainder of the evaluation will be completed by another provider, this initial triage assessment does not replace that evaluation, and the importance of remaining in the ED until their evaluation is complete.  SOB, LE edema   Adam Sledd A, PA-C 03/22/21 2316    Tegeler, Canary Brim, MD 03/23/21 [phone]

## 2021-03-23 DIAGNOSIS — M869 Osteomyelitis, unspecified: Secondary | ICD-10-CM

## 2021-03-23 DIAGNOSIS — E1159 Type 2 diabetes mellitus with other circulatory complications: Secondary | ICD-10-CM

## 2021-03-23 DIAGNOSIS — I255 Ischemic cardiomyopathy: Secondary | ICD-10-CM

## 2021-03-23 DIAGNOSIS — I214 Non-ST elevation (NSTEMI) myocardial infarction: Secondary | ICD-10-CM

## 2021-03-23 DIAGNOSIS — I2510 Atherosclerotic heart disease of native coronary artery without angina pectoris: Secondary | ICD-10-CM

## 2021-03-23 DIAGNOSIS — I96 Gangrene, not elsewhere classified: Secondary | ICD-10-CM

## 2021-03-23 DIAGNOSIS — Z794 Long term (current) use of insulin: Secondary | ICD-10-CM

## 2021-03-23 DIAGNOSIS — I509 Heart failure, unspecified: Secondary | ICD-10-CM

## 2021-03-23 DIAGNOSIS — M726 Necrotizing fasciitis: Secondary | ICD-10-CM

## 2021-03-23 DIAGNOSIS — I5043 Acute on chronic combined systolic (congestive) and diastolic (congestive) heart failure: Secondary | ICD-10-CM

## 2021-03-23 LAB — TROPONIN I (HIGH SENSITIVITY)
Troponin I (High Sensitivity): 234 ng/L (ref <18)
Troponin I (High Sensitivity): 280 ng/L (ref <18)
Troponin I (High Sensitivity): 361 ng/L (ref <18)
Troponin I (High Sensitivity): 319 ng/L (ref <18)

## 2021-03-23 LAB — BASIC METABOLIC PANEL
Anion gap: 9 (ref 5–15)
BUN: 22 mg/dL — ABNORMAL HIGH (ref 6–20)
CO2: 23 mmol/L (ref 22–32)
Calcium: 8.4 mg/dL — ABNORMAL LOW (ref 8.9–10.3)
Chloride: 106 mmol/L (ref 98–111)
Creatinine, Ser: 1.9 mg/dL — ABNORMAL HIGH (ref 0.61–1.24)
GFR, Estimated: 43 mL/min — ABNORMAL LOW (ref >60)
Glucose, Bld: 145 mg/dL — ABNORMAL HIGH (ref 70–99)
Potassium: 3.9 mmol/L (ref 3.5–5.1)
Sodium: 138 mmol/L (ref 135–145)

## 2021-03-23 LAB — CBC
HCT: 26.5 % — ABNORMAL LOW (ref 39.0–52.0)
Hemoglobin: 8.3 g/dL — ABNORMAL LOW (ref 13.0–17.0)
MCH: 29.5 pg (ref 26.0–34.0)
MCHC: 31.3 g/dL (ref 30.0–36.0)
MCV: 94.3 fL (ref 80.0–100.0)
Platelets: 341 10*3/uL (ref 150–400)
RBC: 2.81 MIL/uL — ABNORMAL LOW (ref 4.22–5.81)
RDW: 15.6 % — ABNORMAL HIGH (ref 11.5–15.5)
WBC: 9.7 10*3/uL (ref 4.0–10.5)
nRBC: 0 % (ref 0.0–0.2)

## 2021-03-23 LAB — GLUCOSE, CAPILLARY
Glucose-Capillary: 124 mg/dL — ABNORMAL HIGH (ref 70–99)
Glucose-Capillary: 122 mg/dL — ABNORMAL HIGH (ref 70–99)

## 2021-03-23 LAB — BRAIN NATRIURETIC PEPTIDE: B Natriuretic Peptide: 1129.9 pg/mL — ABNORMAL HIGH (ref 0.0–100.0)

## 2021-03-23 MED ORDER — INSULIN GLARGINE-YFGN 100 UNIT/ML ~~LOC~~ SOLN
15.0000 [IU] | Freq: Every day | SUBCUTANEOUS | Status: DC
  Administered 2021-03-23 – 2021-03-28 (×6): 15 [IU] via SUBCUTANEOUS
  Filled 2021-03-23 (×8): qty 0.15

## 2021-03-23 MED ORDER — CHLORHEXIDINE GLUCONATE CLOTH 2 % EX PADS
6.0000 | MEDICATED_PAD | Freq: Every day | CUTANEOUS | Status: DC
  Administered 2021-03-23 – 2021-03-29 (×6): 6 via TOPICAL

## 2021-03-23 MED ORDER — FUROSEMIDE 10 MG/ML IJ SOLN
80.0000 mg | Freq: Two times a day (BID) | INTRAMUSCULAR | Status: DC
  Administered 2021-03-23 – 2021-03-24 (×2): 80 mg via INTRAVENOUS
  Filled 2021-03-23 (×2): qty 8

## 2021-03-23 MED ORDER — SODIUM CHLORIDE 0.9 % IV SOLN
3.0000 g | Freq: Four times a day (QID) | INTRAVENOUS | Status: AC
  Administered 2021-03-23: 3 g via INTRAVENOUS
  Filled 2021-03-23: qty 8

## 2021-03-23 MED ORDER — INSULIN ASPART 100 UNIT/ML IJ SOLN: 0.0000 [IU] | Freq: Every day | INTRAMUSCULAR | Status: DC

## 2021-03-23 MED ORDER — METOPROLOL SUCCINATE ER 50 MG PO TB24
50.0000 mg | ORAL_TABLET | Freq: Every day | ORAL | Status: DC
  Administered 2021-03-23 – 2021-03-29 (×6): 50 mg via ORAL
  Filled 2021-03-23 (×7): qty 1

## 2021-03-23 MED ORDER — SILVER SULFADIAZINE 1 % EX CREA
1.0000 "application " | TOPICAL_CREAM | Freq: Every day | CUTANEOUS | Status: DC
  Administered 2021-03-23 – 2021-03-25 (×3): 1 via TOPICAL
  Filled 2021-03-23: qty 85

## 2021-03-23 MED ORDER — SODIUM CHLORIDE 0.9 % IV SOLN
3.0000 g | Freq: Four times a day (QID) | INTRAVENOUS | Status: DC
  Administered 2021-03-23: 3 g via INTRAVENOUS
  Filled 2021-03-23 (×5): qty 8

## 2021-03-23 MED ORDER — ASPIRIN EC 81 MG PO TBEC
81.0000 mg | DELAYED_RELEASE_TABLET | Freq: Every day | ORAL | Status: DC
  Administered 2021-03-23 – 2021-03-27 (×4): 81 mg via ORAL
  Filled 2021-03-23 (×5): qty 1

## 2021-03-23 MED ORDER — INSULIN ASPART 100 UNIT/ML IJ SOLN
0.0000 [IU] | Freq: Three times a day (TID) | INTRAMUSCULAR | Status: DC
  Administered 2021-03-23 – 2021-03-24 (×2): 2 [IU] via SUBCUTANEOUS
  Administered 2021-03-24: 3 [IU] via SUBCUTANEOUS
  Administered 2021-03-25: 2 [IU] via SUBCUTANEOUS
  Administered 2021-03-25: 3 [IU] via SUBCUTANEOUS
  Administered 2021-03-25: 2 [IU] via SUBCUTANEOUS
  Administered 2021-03-26 – 2021-03-27 (×5): 3 [IU] via SUBCUTANEOUS
  Administered 2021-03-27: 2 [IU] via SUBCUTANEOUS
  Administered 2021-03-28: 3 [IU] via SUBCUTANEOUS
  Administered 2021-03-28: 2 [IU] via SUBCUTANEOUS
  Administered 2021-03-29: 5 [IU] via SUBCUTANEOUS

## 2021-03-23 MED ORDER — FUROSEMIDE 10 MG/ML IJ SOLN
80.0000 mg | Freq: Once | INTRAMUSCULAR | Status: AC
  Administered 2021-03-23: 80 mg via INTRAVENOUS
  Filled 2021-03-23: qty 8

## 2021-03-23 MED ORDER — INSULIN ASPART 100 UNIT/ML IJ SOLN
8.0000 [IU] | Freq: Three times a day (TID) | INTRAMUSCULAR | Status: DC
  Administered 2021-03-24: 8 [IU] via SUBCUTANEOUS

## 2021-03-23 MED ORDER — SUCRALFATE 1 GM/10ML PO SUSP
1.0000 g | Freq: Three times a day (TID) | ORAL | Status: DC
  Administered 2021-03-23 – 2021-03-29 (×23): 1 g via ORAL
  Filled 2021-03-23 (×23): qty 10

## 2021-03-23 MED ORDER — ENOXAPARIN SODIUM 40 MG/0.4ML IJ SOSY
40.0000 mg | PREFILLED_SYRINGE | Freq: Every day | INTRAMUSCULAR | Status: DC
  Administered 2021-03-23 – 2021-03-29 (×6): 40 mg via SUBCUTANEOUS
  Filled 2021-03-23 (×6): qty 0.4

## 2021-03-23 MED ORDER — SODIUM CHLORIDE 0.9% FLUSH
10.0000 mL | INTRAVENOUS | Status: DC | PRN
  Administered 2021-03-24: 10 mL

## 2021-03-23 MED ORDER — DOXYCYCLINE HYCLATE 100 MG PO TABS
100.0000 mg | ORAL_TABLET | Freq: Two times a day (BID) | ORAL | Status: DC
  Administered 2021-03-24 – 2021-03-29 (×11): 100 mg via ORAL
  Filled 2021-03-23 (×11): qty 1

## 2021-03-23 MED ORDER — ATORVASTATIN CALCIUM 80 MG PO TABS
80.0000 mg | ORAL_TABLET | Freq: Every day | ORAL | Status: DC
  Administered 2021-03-23 – 2021-03-29 (×7): 80 mg via ORAL
  Filled 2021-03-23 (×7): qty 1

## 2021-03-23 MED ORDER — PANTOPRAZOLE SODIUM 40 MG PO TBEC
40.0000 mg | DELAYED_RELEASE_TABLET | Freq: Two times a day (BID) | ORAL | Status: DC
  Administered 2021-03-23 – 2021-03-29 (×11): 40 mg via ORAL
  Filled 2021-03-23 (×12): qty 1

## 2021-03-23 MED ORDER — AMLODIPINE BESYLATE 5 MG PO TABS: 5.0000 mg | ORAL_TABLET | Freq: Every day | ORAL | Status: DC

## 2021-03-23 MED ORDER — SODIUM CHLORIDE 0.9 % IV SOLN: INTRAVENOUS | Status: DC | PRN

## 2021-03-23 MED ORDER — AMOXICILLIN-POT CLAVULANATE 875-125 MG PO TABS
1.0000 | ORAL_TABLET | Freq: Two times a day (BID) | ORAL | Status: DC
  Administered 2021-03-24 – 2021-03-29 (×11): 1 via ORAL
  Filled 2021-03-23 (×11): qty 1

## 2021-03-23 NOTE — Consult Note (Signed)
[facility]       Reason for Consult: osteomyelitis    Referring Physician: Dr. Criselda Peaches  Active Problems:   Acute on chronic heart failure (HCC)    aspirin EC  81 mg Oral Daily   atorvastatin  80 mg Oral Daily   Chlorhexidine Gluconate Cloth  6 each Topical Daily   enoxaparin (LOVENOX) injection  40 mg Subcutaneous Daily   furosemide  80 mg Intravenous BID   metoprolol succinate  50 mg Oral Daily   pantoprazole  40 mg Oral BID AC   silver sulfADIAZINE  1 application Topical Daily   sucralfate  1 g Oral TID WC & HS    Recommendations: Continue with doxycycline and augmentin Reconsult Dr. Lajoyce Corners Monday for reevaluation of the leg  Assessment: He has osteomyelitis, poorly controlled diabetes, 3 vessel cardiac disease and here for heart failure. He underwent significant debridements during his last hospitalization and arrangements had been made for outpatient IV antibiotics but he had refused.   On exam of his leg now, I notice some purulence but no surrounding erythema.  I am not sure how much is the santyl but has quite a bit of discharge, though otherwise good granulation tissue noted and no other signs of infection.  Pictures in Epic  Antibiotics: Doxycyline and Augmentin.  HPI: Adam James is a 50 y.o. male with CAD and plan for CABG, diabetes with his last Hgb A1c of 8.8 and treated for osteomyelitis with multiple organisms including MRSA, Streptococcal agalactiae, Eikenella corrodens and clostrium species comes in now with issues related to heart failure.  I was asked to see if any changes to his treatment is indicated.     Review of Systems:  Constitutional: negative for fevers and chills Gastrointestinal: negative for nausea and diarrhea All other systems reviewed and are negative    Past Medical History:  Diagnosis Date   CAD (coronary artery disease)    Cardiomyopathy (HCC)    Diabetic foot ulcer (HCC)    Dyslipidemia    Heart  failure (HCC)    Myocardial infarction (HCC)    Type 2 diabetes mellitus (HCC)     Social History   Tobacco Use   Smoking status: Every Day    Types: Cigarettes   Smokeless tobacco: Never   Tobacco comments:    5-6 daily  Vaping Use   Vaping Use: Never used  Substance Use Topics   Alcohol use: Not Currently    Comment: Very rare   Drug use: Not Currently    Types: Marijuana    Family History  Problem Relation Age of Onset   Hypertension Mother    Heart failure Father    Hypertension Brother    Congestive Heart Failure Brother     Allergies  Allergen Reactions   Morphine Itching    Physical Exam: Constitutional: in no apparent distress  Vitals:   03/23/21 1031 03/23/21 1031  BP: 103/78 103/78  Pulse: (!) 106 (!) 110  Resp: 18 18  Temp: 98.3 F (36.8 C) 98.3 F (36.8 C)  SpO2:  96%   EYES: anicteric ENMT: no thrush Cardiovascular: Cor RRR Respiratory: clear; Musculoskeletal: left leg with large incision and sutures in place; white purulence coming from the wounds mixed with serosanguinous discharge.   Skin: negatives: no rash  Lab Results  Component Value Date   WBC 9.7 03/23/2021   HGB 8.3 (L) 03/23/2021   HCT 26.5 (L) 03/23/2021   MCV 94.3 03/23/2021  PLT 341 03/23/2021    Lab Results  Component Value Date   CREATININE 1.90 (H) 03/23/2021   BUN 22 (H) 03/23/2021   NA 138 03/23/2021   K 3.9 03/23/2021   CL 106 03/23/2021   CO2 23 03/23/2021    Lab Results  Component Value Date   ALT 17 03/15/2021   AST 25 03/15/2021   ALKPHOS 49 03/15/2021     Microbiology: No results found for this or any previous visit (from the past 240 hour(s)).  Gardiner Barefoot, MD [email or web address] 03/23/2021, 12:40 PM

## 2021-03-23 NOTE — H&P (Addendum)
Date: 03/23/2021               Patient Name:  Adam James MRN: [id number]  DOB: Dec 06, 1970 Age / Sex: 50 y.o., male   PCP: Patient, No Pcp Per (Inactive)         Medical Service: Internal Medicine Teaching Service         Attending Physician: Dr. Inez Catalina, MD    First Contact: Elza Rafter, DO Pager: [phone]  Second Contact: Merrilyn Puma, MD Pager: Nada Maclachlan [phone]       After Hours (After 5p/  First Contact Pager: [phone]  weekends / holidays): Second Contact Pager: [phone]    Chief Complaint: SOB  History of Present Illness:  Adam James is a 50 y.o. 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  Pt states that he has "felt like he has fluid on him" since being discharged from hospital on 8/20 for left foot infection with gas gangrene and necrotizing fascitis requiring extensive debridement and anterior lateral compartment resection.. He has had progressive SOB with ambulation, as little as walking to the bathroom since being discharged. At baseline, he sleeps sitting in a chair, and cannot sleep flat. He states that the SOB "felt so bad" yesterday so he came to the ED. He has been drinking more than 2 liters of fluids/day, saying "I can easily drink up to 5 liters." He does not follow a salt restricted diet. He was counseled on the importance of fluid restriction in pt's with HF, however is not willing to acknowledge it. He consistently urinates 4-5 times/day. Has 6-7 pound weight gain since being discharged.   He denies abdominal pain, leg pain, changes in vision, fever, night sweats, N/V or fatigue. He had some mild CP during presentation, which resolved quickly. Denies current CP. He has no concerns about his left foot, stating he performs daily dressing changes and only  notes a small amount of white discharge from incision site during dressing changes, and does not notice any odor.   ED course:  BNP 1129, 103 3 weeks ago. Lasix IV 80  Rising trops 234 > 280 > 361, no chest pain. Cardiology consulted.   Saturating well on RA. Normotensive, tachycardic (100s), and afebrile. No leukocytosis.  CXR- Small bilateral pleural effusions and interstitial edema   Meds:  Current Meds  Medication Sig   amLODipine (NORVASC) 5 MG tablet Take 1 tablet (5 mg total) by mouth daily.   amoxicillin-clavulanate (AUGMENTIN) 875-125 MG tablet Take 1 tablet by mouth 2 (two) times daily.   aspirin EC 81 MG tablet Take 1 tablet (81 mg total) by mouth daily. Swallow whole.   atorvastatin (LIPITOR) 80 MG tablet Take 1 tablet (80 mg total) by mouth daily.   doxycycline (VIBRA-TABS) 100 MG tablet Take 1 tablet (100 mg total) by mouth 2 (two) times daily.   furosemide (LASIX) 40 MG tablet Take 1 tablet (40 mg total) by mouth daily. Take 40 mg every other day alternating with 2 tablets (80mg ) every other day (Patient taking differently: Take 40 mg by mouth as directed. Take 40 mg every other day alternating with 2 tablets (80mg ) every other day)   glyBURIDE (DIABETA) 5 MG tablet Take 5 mg by mouth 2 (two) times daily.   irbesartan (AVAPRO) 75 MG tablet Take  1 tablet (75 mg total) by mouth daily.   metFORMIN (GLUCOPHAGE) 500 MG tablet Take 500 mg by mouth 2 (two) times daily.   metoprolol succinate (TOPROL-XL) 50 MG 24 hr tablet Take 1 tablet (50 mg total) by mouth daily. Take with or immediately following a meal.   pantoprazole (PROTONIX) 40 MG tablet Take 1 tablet (40 mg total) by mouth 2 (two) times daily before a meal.   silver sulfADIAZINE (SILVADENE) 1 % cream Apply 1 application topically daily.   sucralfate (CARAFATE) 1 GM/10ML suspension Take 10 mLs (1 g total) by mouth 4 (four) times daily -  with meals and at bedtime for 12 days.     Allergies: Allergies as of 03/22/2021 -  Review Complete 03/12/2021  Allergen Reaction Noted   Morphine Itching 02/23/2019   Past Medical History:  Diagnosis Date   CAD (coronary artery disease)    Cardiomyopathy (HCC)    Diabetic foot ulcer (HCC)    Dyslipidemia    Heart failure (HCC)    Myocardial infarction (HCC)    Type 2 diabetes mellitus (HCC)     Family History:  Mother HTN Father CHF Brother CHF, HTN  Social History:   Patient states that he lives with daugter in [place], [place]. He consumes cigarettes and smokeless tobacco.   Review of Systems: A complete ROS was negative except as per HPI.    Physical Exam: Blood pressure 113/85, pulse (!) 107, temperature 99.1 F (37.3 C), temperature source Oral, resp. rate 16, SpO2 96 %.  Constitutional: alert, well-appearing, in no acute distress HENT: normocephalic, atraumatic, mucous membranes moist Eyes: conjunctiva non-erythematous, extraocular movements intact Neck: supple Cardiovascular: Tachycardia, regular rhythm, no m/r/g Pulmonary/Chest: normal work of breathing on room air, no respiratory distress. Crackles present. Abdominal: soft, non-tender to palpation, non-distended MSK: Decreased bulk and tone of left leg. Pitting edema up to shins on bilateral lower extremities.  Neurological: alert & oriented x 3 Skin: warm and dry Psych: normal behavior, normal affect   EKG: sinus tachycardia   CXR: Small bilateral pleural effusions and interstitial edema  Assessment & Plan by Problem: Active Problems:   Acute on chronic heart failure (HCC)  Adam James is a 50 y.o. 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   Acute on Chronic HFrEF exacerbation  Pt with HFrEF (LVEF 30-35 on 02/20/21) not adherent to fluid restriction or  a low salt diet presenting with SOB, orthopnea, lower extremity edema and a 6-7 lbs weight gain over several days. No changes in urination, 4-5 times day. BNP elevated at 1129, was 103 3 weeks ago. CXR positive for bilateral pleural effusions and interstitial edema. Echo showing LV regional wall motion abnormalities with concentric hypertrophy, grade I diastolic dysfunction, and aortic dilatation.  -Cardiology consulted, appreciate recommendations  -Lasix 80 bid  -Fluid restriction to 1.5 L/day, as pt states he will not adhere to 1 L/day.  -Strict I&Os and daily weights -Consulted TOC heart failure clinic     NSTEMI Hx of CAD High sensitivity troponins trending up 234 > 280 > 361. Pt denies CP, diaphoresis and abdominal pain. Cards consulted, pattern not consistent with ACS, and likely demand ischemia in setting of volume exacerbation. Not recommending heparin given recent severe GI bleed requiring transfusion.  -Cardiology following, appreciate recommendations  -Will cycle until  it trops peaks   T2DM with diabetic neuropathy  HbA1c 8.8%.Goal blood sugar < 180 to promote wound healing.  -15u semglee bedtime  -novoLOG 8u tid mealtime  -SSI - Hold metformin due to AKI - CBG monitoring   Acute on Chronic Kidney diease stage 3a Mildly elevated BUN and Cr 1.9 compared to baseline of Cr 1.6. Likely from volume overload in setting of acute on chronic HFrEF exacerbation, suspect will improve with diuresis.  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 He is afebrile, vitals are stable. No leukocytosis. He performs daily dressing changes, and notes small amount of white discharge from operation site but no odor.  He was on IV abx during hospitalization, did not wish to peruse measures to continue IV abx, and started on PO  doxycycline and Augmentin. On exam, no erythema or tenderness to palpation of left leg. -ID consulted, recommending continuing doxycyline and Augmentin and to re-consult Dr Lajoyce Corners.  -Started unasyn  -wound care consult -daily dressing changes -trend CBC   HLD -continue atorvastatin 80  -continue aspirin 81  HTN Remains normotensive. Continue metoprolol succinate.   Best Practice: Diet: Heart Healthy IVF: None,None VTE: levenox 40 Code: Full  Signature: Carmel Sacramento, MD  Internal Medicine Resident, PGY-1 [facility]  Pager: [phone] 4:36 PM, 03/23/2021

## 2021-03-23 NOTE — ED Notes (Signed)
RN attempted report x1.  

## 2021-03-23 NOTE — ED Notes (Signed)
RN attempted report x2 

## 2021-03-23 NOTE — Consult Note (Addendum)
Cardiology Consultation:   Patient ID: Adam James MRN: [id number]; DOB: [birth year]  Admit date: 03/22/2021 Date of Consult: 03/23/2021  PCP:  Patient, No Pcp Per (Inactive)   [facility] Providers Cardiologist:  Norman Herrlich, MD   {  Patient Profile:   Adam James is a 50 y.o. male with a hx of CAD (pending CABG  with Dr. Cliffton Asters), chronic combined CHF, diabetes mellitus with diabetic foot, CKD stage III, hypertension, hyperlipidemia who is being seen 03/23/2021 for the evaluation of CHF and elevated troponin at the request of Dr. Criselda Peaches.  Cardiac catheterization in February 2022 showed three-vessel coronary artery disease.  Echocardiogram with LV function of 40 to 45% and grade 1 diastolic dysfunction.  Cardiac MRI showed viability.  Last seen by Dr. Dulce Sellar in June 2022.  He has financial barriers to healthcare.  He was restarted on cardiac medication with social worker consult.  He was seen by Dr. Cliffton Asters in July and tentatively scheduled CABG in early August but canceled due to long hospitalization.  Echocardiogram July 28 showed further reduction in LV function to 30 to 35%, regional wall motion abnormality, grade 1 diastolic dysfunction.  Normal RV function.  No significant valvular abnormality  Admitted 02/24/21-03/15/21 for left foot infection with gas gangrene, osteomyelitis and necrotizing fasciitis.  He underwent debridement x3 with attempted limb salvation.  Treated with antibiotic for polymicrobial on culture.  Also had coffee-ground emesis secondary to erosive esophagitis with ulceration.  He is transfused multiple units of blood and platelets.  GI recommended repeat EGD in 8 weeks.  History of Present Illness:   Adam James reports doing well after recent discharge however, yesterday he started to noticing shortness of breath, orthopnea and lower extremity edema.  Denies chest pain, palpitation, dizziness or blood in his stool or urine.  Reports he did not took  Lasix for first 2 days after discharge (waiting for mail delivery) but since then he is taking 40 and 80 mg on alternative days.  No daily weight.  Noncompliant with low-sodium diet.  He eats what ever he can do to financial problems.  BNP 1129 High-sensitivity troponin 234>> 280>> 361 Serum creatinine 1.9 (up from 1.63 on 8/20) Hemoglobin 8.3 Chest x-ray with bilateral pleural effusion and interstitial edema  Started on IV Lasix 80 mg twice daily.  Cardiology is asked for further treatment and evaluation.   Past Medical History:  Diagnosis Date   CAD (coronary artery disease)    Cardiomyopathy (HCC)    Diabetic foot ulcer (HCC)    Dyslipidemia    Heart failure (HCC)    Myocardial infarction (HCC)    Type 2 diabetes mellitus (HCC)     Past Surgical History:  Procedure Laterality Date   APPENDECTOMY     APPLICATION OF WOUND VAC Left 03/03/2021   Procedure: APPLICATION OF WOUND VAC;  Surgeon: Tarry Kos, MD;  Location: [facility] OR;  Service: Orthopedics;  Laterality: Left;   APPLICATION OF WOUND VAC  03/07/2021   Procedure: APPLICATION OF WOUND VAC;  Surgeon: [name], MD;  Location: [facility] OR;  Service: Orthopedics;;   CARDIAC CATHETERIZATION     COLON SURGERY     ESOPHAGOGASTRODUODENOSCOPY (EGD) WITH PROPOFOL N/A 03/12/2021   Procedure: ESOPHAGOGASTRODUODENOSCOPY (EGD) WITH PROPOFOL;  Surgeon: Jenel Lucks, MD;  Location: [facility] ENDOSCOPY;  Service: Gastroenterology;  Laterality: N/A;   I & D EXTREMITY Left 02/24/2021   Procedure: IRRIGATION AND DEBRIDEMENT EXTREMITY;  Surgeon: Myrene Galas, MD;  Location[facility] OR;  Service: Orthopedics;  Laterality: Left;   I & D EXTREMITY Left 02/26/2021   Procedure: IRRIGATION AND DEBRIDEMENT OF LEG;  Surgeon: Nadara Mustarduda, Marcus V, MD;  [facility];  Service: Orthopedics;  Laterality: Left;   I & D EXTREMITY Left 03/03/2021   Procedure: LEFT LEG IRRIGATION AND DEBRIDEMENT;  Surgeon: Tarry KosXu, Naiping M, MD;  Location: [facility] OR;  Service: Orthopedics;   Laterality: Left;   I & D EXTREMITY Left 03/07/2021   Procedure: REPEAT DEBRIDEMENT LEFT LEG;  Surgeon: Nadara Mustarduda, Marcus V, MD;  [facility];  Service: Orthopedics;  Laterality: Left;   IR FLUORO GUIDE CV LINE RIGHT  03/10/2021   IR US GUIDE VASC ACCESS RIGHT  03/10/2021   LEFT HEART CATH AND CORONARY ANGIOGRAPHY N/A 09/13/2020   Procedure: LEFT HEART CATH AND CORONARY ANGIOGRAPHY;  Surgeon: Lennette BihariKelly, Thomas A, MD;  Location: [facility] INVASIVE CV LAB;  Service: Cardiovascular;  Laterality: N/A;   TOE AMPUTATION  2020      Inpatient Medications: Scheduled Meds:  aspirin EC  81 mg Oral Daily   atorvastatin  80 mg Oral Daily   enoxaparin (LOVENOX) injection  40 mg Subcutaneous Daily   furosemide  80 mg Intravenous BID   metoprolol succinate  50 mg Oral Daily   pantoprazole  40 mg Oral BID AC   silver sulfADIAZINE  1 application Topical Daily   sucralfate  1 g Oral TID WC & HS   Continuous Infusions:  ampicillin-sulbactam (UNASYN) IV     PRN Meds:   Allergies:    Allergies  Allergen Reactions   Morphine Itching    Social History:   Social History   Socioeconomic History   Marital status: Single    Spouse name: Not on file   Number of children: Not on file   Years of education: Not on file   Highest education level: Not on file  Occupational History   Not on file  Tobacco Use   Smoking status: Every Day    Types: Cigarettes   Smokeless tobacco: Never   Tobacco comments:    5-6 daily  Vaping Use   Vaping Use: Never used  Substance and Sexual Activity   Alcohol use: Not Currently    Comment: Very rare   Drug use: Not Currently    Types: Marijuana   Sexual activity: Not on file  Other Topics Concern   Not on file  Social History Narrative   Not on file   Social Determinants of Health   Financial Resource Strain: High Risk   Difficulty of Paying Living Expenses: Very hard  Food Insecurity: No Food Insecurity   Worried About Programme researcher, broadcasting/film/videounning Out of Food in the Last Year: Never  true   Baristaan Out of Food in the Last Year: Never true  Transportation Needs: Unmet Transportation Needs   Lack of Transportation (Medical): Yes   Lack of Transportation (Non-Medical): Yes  Physical Activity: Not on file  Stress: Not on file  Social Connections: Not on file  Intimate Partner Violence: Not on file    Family History:   Family History  Problem Relation Age of Onset   Hypertension Mother    Heart failure Father    Hypertension Brother    Congestive Heart Failure Brother      ROS:  Please see the history of present illness.  All other ROS reviewed and negative.     Physical Exam/Data:   Vitals:   03/23/21 0830 03/23/21 0930 03/23/21 1031 03/23/21 1031  BP: 101/64 (!) 113/94 103/78  103/78  Pulse: (!) 103 (!) 104 (!) 106 (!) 110  Resp: 19 (!) 26 18 18   Temp:   98.3 F (36.8 C) 98.3 F (36.8 C)  TempSrc:      SpO2: 94% (!) 83%  96%  Weight:    122.1 kg  Height:    6\' 3"  (1.905 m)   No intake or output data in the 24 hours ending 03/23/21 1105 Last 3 Weights 03/23/2021 03/15/2021 03/14/2021  Weight (lbs) 269 lb 3.4 oz 272 lb 0.8 oz 266 lb 8.6 oz  Weight (kg) 122.113 kg 123.4 kg 120.9 kg     Body mass index is 33.65 kg/m.  General:  Well nourished, well developed, in no acute distress HEENT: normal Lymph: no adenopathy Endocrine:  No thryomegaly Vascular: No carotid bruits; FA pulses 2+ bilaterally without bruits  Cardiac:  normal S1, S2; RRR; no murmur  Lungs: Diminished breath sound at base with faint rales Abd: soft, nontender, no hepatomegaly  Ext: Right lower extremity with 2+ edema, left lower extremity with brace/boot Musculoskeletal:  No deformities Skin: warm and dry  Neuro:  CNs 2-12 intact, no focal abnormalities noted Psych:  Normal affect   EKG:  The EKG was personally reviewed and demonstrates: Sinus tachycardia at rate of 107 bpm, PVC, repolarization abnormality Telemetry:  Telemetry was personally reviewed and demonstrates: Was not on  telemetry in ER  Relevant CV Studies:  Echo 02/20/21 1. Anterior, anteroseptal and inferoseptal hypokinesis. Left ventricular  ejection fraction, by estimation, is 30 to 35%. The left ventricle has  moderately decreased function. The left ventricle demonstrates regional  wall motion abnormalities (see  scoring diagram/findings for description). There is mild concentric left  ventricular hypertrophy. Left ventricular diastolic parameters are  consistent with Grade I diastolic dysfunction (impaired relaxation).   2. Right ventricular systolic function is normal. The right ventricular  size is normal. There is normal pulmonary artery systolic pressure.   3. The mitral valve is normal in structure. Trivial mitral valve  regurgitation. No evidence of mitral stenosis.   4. The aortic valve is tricuspid. Aortic valve regurgitation is trivial.  No aortic stenosis is present.   5. Aortic dilatation noted. There is borderline dilatation of the aortic  root and of the ascending aorta, measuring 36 mm.   6. The inferior vena cava is normal in size with greater than 50%  respiratory variability, suggesting right atrial pressure of 3 mmHg.    LEFT HEART CATH AND CORONARY ANGIOGRAPHY  08/2020   Conclusion    Mid LM to Dist LM lesion is 50% stenosed. Ramus lesion is 70% stenosed. 1st Mrg lesion is 50% stenosed. Prox Cx lesion is 70% stenosed. Prox RCA to Mid RCA lesion is 100% stenosed. Dist RCA lesion is 100% stenosed. Ost LAD to Mid LAD lesion is 90% stenosed. Dist LAD lesion is 100% stenosed. Mid LAD to Dist LAD lesion is 90% stenosed.   Severe multivessel coronary obstructive disease with smooth 50% distal left main stenosis prior to trifurcating into the LAD, ramus intermediate, and left circumflex vessel.     The proximal LAD stent had diffuse 80-90 and 95% stenoses.  The LAD beyond the stent that segment had diffuse narrowing of 90 to 95% and then was totally occluded after a small  second diagonal branch; 70% proximal ramus intermediate stenosis; 70% LAD stenosis with 50% stenosis in the OM vessel prior to its bifurcation; and total mid RCA occlusion with bridging collaterals abd total distal occlusion before the PDA  takeoff with left to right collaterals supplying the PDA and PLA vessel.   LVEDP 15 mm Hg.   RECOMMENDATION: We will obtain a 2D echo Doppler study today.  Plan for discharge later today with imminent surgical consultation as an outpatient for CABG revascularization.  In the interim, will add amlodipine for anti-ischemic benefit to his current metoprolol dose, increase statin therapy to atorvastatin 80 mg.  Smoking cessation was strongly recommended.    Diagnostic Dominance: Right  Laboratory Data:  High Sensitivity Troponin:   Recent Labs  Lab 02/24/21 1708 02/24/21 1934 03/23/21 0044 03/23/21 0113 03/23/21 0451  TROPONINIHS 34* 27* 234* 280* 361*     Chemistry Recent Labs  Lab 03/23/21 0044  NA 138  K 3.9  CL 106  CO2 23  GLUCOSE 145*  BUN 22*  CREATININE 1.90*  CALCIUM 8.4*  GFRNONAA 43*  ANIONGAP 9    No results for input(s): PROT, ALBUMIN, AST, ALT, ALKPHOS, BILITOT in the last 168 hours. Hematology Recent Labs  Lab 03/23/21 0044  WBC 9.7  RBC 2.81*  HGB 8.3*  HCT 26.5*  MCV 94.3  MCH 29.5  MCHC 31.3  RDW 15.6*  PLT 341   BNP Recent Labs  Lab 03/23/21 0045  BNP 1,129.9*     Radiology/Studies:  DG Chest 2 View  Result Date: 03/22/2021 CLINICAL DATA:  Shortness of breath. EXAM: CHEST - 2 VIEW COMPARISON:  Chest radiograph dated 02/22/2019 FINDINGS: Right-sided central venous line with tip at the cavoatrial junction. Diffuse interstitial prominence may represent edema or atypical pneumonia. No lobar consolidation. Probable small bilateral pleural effusions. No pneumothorax. The cardiac silhouette is within limits. No acute osseous pathology. IMPRESSION: Small bilateral pleural effusions and interstitial edema.  Pneumonia is not excluded. Electronically Signed   By: Elgie Collard M.D.   On: 03/22/2021 23:51     Assessment and Plan:   Acute on chronic combined CHF - In setting of noncompliance with salt.  He missed Lasix initially after discharge but is taking as prescribed.  BNP 1129.  Chest x-ray with bilateral pleural effusion and interstitial edema. - Echocardiogram July 28 showed further reduction in LV function to 30 to 35% (EF was 40-45% in 08/2020), regional wall motion abnormality, grade 1 diastolic dysfunction.  Normal RV function.  No significant valvular abnormality. -Continue IV Lasix 80 mg twice daily. -Strict INO and daily weight -Continue Toprol-XL -Stop amlodipine (blood pressure stop)>> titrate guideline directed therapy -Review guideline directed medical therapy during this admission.  He will benefit from [facility] heart failure clinic.  2.  Non-STEMI with history of CAD - High-sensitivity troponin 234>> 280>> 361 -Thankfully patient denies chest pain.   Recently treated with multiple units of transfusion secondary to acute blood loss anemia.  Will avoid heparin.  Cycle troponin.  Likely demand ischemia in setting of volume exacerbation. -Cancel CABG this month due to long admission  3.  Acute on chronic kidney disease stage III -Same Baseline creatinine around 1.6.  It was peaked to 3.4 during last admission. -Follow closely with diuresis -Creatinine of 1.9 today  4. HLD - 10/07/2020: Cholesterol, Total 167; HDL 36; LDL Chol Calc (NIH) 108; Triglycerides 125  -Continue Lipitor 80 mg daily  5.  Left foot gas gangrene -Per primary team  Risk Assessment/Risk Scores:   TIMI Risk Score for Unstable Angina or Non-ST Elevation MI:   The patient's TIMI risk score is 4, which indicates a 20% risk of all cause mortality, new or recurrent myocardial infarction or need for  urgent revascularization in the next 14 days.  New York Heart Association (NYHA) Functional Class NYHA Class  III    For questions or updates, please contact [facility] Please consult [email or web address] for contact info under    Lorelei Pont, PA  03/23/2021 11:05 AM

## 2021-03-23 NOTE — Progress Notes (Signed)
Pharmacy Antibiotic Note  Adam James is a 50 y.o. male admitted on 03/22/2021 with  gangrene and necrotizing fasciitis of left leg s/p debridement .  Pharmacy has been consulted for Unasyn dosing.  Patient with history of T2DM, ischemic heart disease, chronic heart failure with reduced ejection fraction, CKD stage IIIa, and left diabetic foot infection with gas gangrene and necrotizing fasciitis . Patient presenting for SOB x 1-2 days.  SCr 1.9; WBC 9.7  Plan: Unasyn 3g q6h unless change in renal function Monitor cultures and renal function F/u wound care recommendations     Temp (24hrs), Avg:99.1 F (37.3 C), Min:99.1 F (37.3 C), Max:99.1 F (37.3 C)  Recent Labs  Lab 03/23/21 0044  WBC 9.7  CREATININE 1.90*    Estimated Creatinine Clearance: 67.3 mL/min (A) (by C-G formula based on SCr of 1.9 mg/dL (H)).    Allergies  Allergen Reactions   Morphine Itching    Antimicrobials this admission: Unasyn 8/28 >>   Microbiology results: Pending  Thank you for allowing pharmacy to be a part of this patient's care.  Delmar Landau, PharmD, BCPS 03/23/2021 9:49 AM ED Clinical Pharmacist -  [phone]

## 2021-03-23 NOTE — Consult Note (Signed)
WOC Nurse Consult Note: Pt receiving care in [id number] Reason for Consult: LLE necrotizing fascitis Wound type: Sutures still in place. Original I & D surgery completed by Dr. Lajoyce Corners. Consult to this doctor is appreciated.   Thank you for the consult. WOC nurse will not follow at this time.   Please re-consult the WOC team if needed.  Renaldo Reel Katrinka Blazing, MSN, RN, CMSRN, Angus Seller, [facility] Wound Treatment Associate Pager [phone]

## 2021-03-23 NOTE — ED Provider Notes (Signed)
[name] EMERGENCY DEPARTMENT Provider Note   CSN: [id number] Arrival date & time: 03/22/21  2257     History No chief complaint on file.   Adam James is a 50 y.o. male.  50 year old male the presents to the emergency department today with exertional dyspnea and orthopnea.  Patient states that he was in the hospital recently for necrotizing fasciitis.  States that his leg started get swollen on the right after the surgery and he gained 6 pounds while in the hospital.  Patient was discharged in same condition and over the last couple weeks that is progressively worsened.  Patient states his abdomen feels distended.  States if he lays anyway but straight up and down he gets significantly short of breath.  Not hypoxic.  No persistent chest pain.  Does have history of 9 stents.       Past Medical History:  Diagnosis Date   CAD (coronary artery disease)    Cardiomyopathy (HCC)    Diabetic foot ulcer (HCC)    Dyslipidemia    Heart failure (HCC)    Myocardial infarction (HCC)    Type 2 diabetes mellitus (HCC)     Patient Active Problem List   Diagnosis Date Noted   Gastroesophageal reflux disease with esophagitis and hemorrhage    Anemia, posthemorrhagic, acute    Acute esophagitis    Gastrointestinal hemorrhage    Necrotizing fasciitis (HCC)    Left leg cellulitis    Diabetic polyneuropathy associated with type 2 diabetes mellitus (HCC)    Severe protein-calorie malnutrition (HCC)    Gangrene of left foot (HCC) 02/25/2021   Type 2 diabetes mellitus (HCC)    Heart failure (HCC)    Dyslipidemia    Diabetic foot ulcer (HCC)    CAD (coronary artery disease)    Cardiomyopathy (HCC)    Coronary artery disease involving native coronary artery of native heart with angina pectoris [facility])     Past Surgical History:  Procedure Laterality Date   APPENDECTOMY     APPLICATION OF WOUND VAC Left 03/03/2021   Procedure: APPLICATION OF WOUND VAC;  Surgeon: Tarry Kos, MD;  Location: [facility] OR;  Service: Orthopedics;  Laterality: Left;   APPLICATION OF WOUND VAC  03/07/2021   Procedure: APPLICATION OF WOUND VAC;  Surgeon: [name], MD;  Location: [facility] OR;  Service: Orthopedics;;   CARDIAC CATHETERIZATION     COLON SURGERY     ESOPHAGOGASTRODUODENOSCOPY (EGD) WITH PROPOFOL N/A 03/12/2021   Procedure: ESOPHAGOGASTRODUODENOSCOPY (EGD) WITH PROPOFOL;  Surgeon: Jenel Lucks, MD;  Location: [facility] ENDOSCOPY;  Service: Gastroenterology;  Laterality: N/A;   I & D EXTREMITY Left 02/24/2021   Procedure: IRRIGATION AND DEBRIDEMENT EXTREMITY;  Surgeon: Myrene Galas, MD;  Location: [facility] OR;  Service: Orthopedics;  Laterality: Left;   I & D EXTREMITY Left 02/26/2021   Procedure: IRRIGATION AND DEBRIDEMENT OF LEG;  Surgeon: [name], MD;  Location: [facility] OR;  Service: Orthopedics;  Laterality: Left;   I & D EXTREMITY Left 03/03/2021   Procedure: LEFT LEG IRRIGATION AND DEBRIDEMENT;  Surgeon: Tarry Kos, MD;  Location: [facility] OR;  Service: Orthopedics;  Laterality: Left;   I & D EXTREMITY Left 03/07/2021   Procedure: REPEAT DEBRIDEMENT LEFT LEG;  Surgeon: [name], MD;  Location: [facility] OR;  Service: Orthopedics;  Laterality: Left;   IR FLUORO GUIDE CV LINE RIGHT  03/10/2021   IR US GUIDE VASC ACCESS RIGHT  03/10/2021   LEFT HEART CATH AND  CORONARY ANGIOGRAPHY N/A 09/13/2020   Procedure: LEFT HEART CATH AND CORONARY ANGIOGRAPHY;  Surgeon: Lennette Bihari, MD;  Location: [facility] INVASIVE CV LAB;  Service: Cardiovascular;  Laterality: N/A;   TOE AMPUTATION  2020       Family History  Problem Relation Age of Onset   Hypertension Mother    Heart failure Father    Hypertension Brother    Congestive Heart Failure Brother     Social History   Tobacco Use   Smoking status: Every Day    Types: Cigarettes   Smokeless tobacco: Never   Tobacco comments:    5-6 daily  Vaping Use   Vaping Use: Never used  Substance Use Topics   Alcohol use: Not Currently     Comment: Very rare   Drug use: Not Currently    Types: Marijuana    Home Medications Prior to Admission medications   Medication Sig Start Date End Date Taking? Authorizing Provider  amLODipine (NORVASC) 5 MG tablet Take 1 tablet (5 mg total) by mouth daily. 02/20/21  Yes Baldo Daub, MD  amoxicillin-clavulanate (AUGMENTIN) 875-125 MG tablet Take 1 tablet by mouth 2 (two) times daily. 03/14/21 04/18/21 Yes Atway, Rayann N, DO  aspirin EC 81 MG tablet Take 1 tablet (81 mg total) by mouth daily. Swallow whole. 02/20/21  Yes Baldo Daub, MD  atorvastatin (LIPITOR) 80 MG tablet Take 1 tablet (80 mg total) by mouth daily. 02/20/21  Yes Baldo Daub, MD  doxycycline (VIBRA-TABS) 100 MG tablet Take 1 tablet (100 mg total) by mouth 2 (two) times daily. 03/14/21 04/18/21 Yes Atway, Rayann N, DO  furosemide (LASIX) 40 MG tablet Take 1 tablet (40 mg total) by mouth daily. Take 40 mg every other day alternating with 2 tablets (80mg ) every other day Patient taking differently: Take 40 mg by mouth as directed. Take 40 mg every other day alternating with 2 tablets (80mg ) every other day 03/19/21  Yes , MD  glyBURIDE (DIABETA) 5 MG tablet Take 5 mg by mouth 2 (two) times daily. 10/12/20  Yes [provider]  irbesartan (AVAPRO) 75 MG tablet Take 1 tablet (75 mg total) by mouth daily. 01/20/21  Yes 10/14/20, MD  metFORMIN (GLUCOPHAGE) 500 MG tablet Take 500 mg by mouth 2 (two) times daily. 10/12/20  Yes [provider]  metoprolol succinate (TOPROL-XL) 50 MG 24 hr tablet Take 1 tablet (50 mg total) by mouth daily. Take with or immediately following a meal. 03/15/21 04/14/21 Yes Atway, Rayann N, DO  pantoprazole (PROTONIX) 40 MG tablet Take 1 tablet (40 mg total) by mouth 2 (two) times daily before a meal. 03/14/21 04/13/21 Yes Atway, Rayann N, DO  silver sulfADIAZINE (SILVADENE) 1 % cream Apply 1 application topically daily.   Yes [provider]  sucralfate  (CARAFATE) 1 GM/10ML suspension Take 10 mLs (1 g total) by mouth 4 (four) times daily -  with meals and at bedtime for 12 days. 03/14/21 03/26/21 Yes Atway, Rayann N, DO    Allergies    Morphine  Review of Systems   Review of Systems  All other systems reviewed and are negative.  Physical Exam Updated Vital Signs BP 113/85   Pulse (!) 107   Temp 99.1 F (37.3 C) (Oral)   Resp 16   SpO2 96%   Physical Exam Vitals and nursing note reviewed.  Constitutional:      Appearance: He is well-developed.  HENT:     Head: Normocephalic  and atraumatic.     Mouth/Throat:     Mouth: Mucous membranes are moist.     Pharynx: Oropharynx is clear.  Cardiovascular:     Rate and Rhythm: Tachycardia present.  Pulmonary:     Effort: Pulmonary effort is normal. No respiratory distress.     Breath sounds: Rales present.  Abdominal:     General: There is no distension.  Musculoskeletal:        General: Swelling (RLE with edema, LLE bandaged (not removed)) present. Normal range of motion.     Cervical back: Normal range of motion.  Skin:    General: Skin is warm and dry.  Neurological:     General: No focal deficit present.     Mental Status: He is alert.    ED Results / Procedures / Treatments   Labs (all labs ordered are listed, but only abnormal results are displayed) Labs Reviewed  BASIC METABOLIC PANEL - Abnormal; Notable for the following components:      Result Value   Glucose, Bld 145 (*)    BUN 22 (*)    Creatinine, Ser 1.90 (*)    Calcium 8.4 (*)    GFR, Estimated 43 (*)    All other components within normal limits  BRAIN NATRIURETIC PEPTIDE - Abnormal; Notable for the following components:   B Natriuretic Peptide 1,129.9 (*)    All other components within normal limits  CBC - Abnormal; Notable for the following components:   RBC 2.81 (*)    Hemoglobin 8.3 (*)    HCT 26.5 (*)    RDW 15.6 (*)    All other components within normal limits  TROPONIN I (HIGH SENSITIVITY) -  Abnormal; Notable for the following components:   Troponin I (High Sensitivity) 234 (*)    All other components within normal limits  TROPONIN I (HIGH SENSITIVITY) - Abnormal; Notable for the following components:   Troponin I (High Sensitivity) 280 (*)    All other components within normal limits  TROPONIN I (HIGH SENSITIVITY) - Abnormal; Notable for the following components:   Troponin I (High Sensitivity) 361 (*)    All other components within normal limits    EKG EKG Interpretation  Date/Time:  Sunday March 23 2021 04:50:53 EDT Ventricular Rate:  107 PR Interval:  138 QRS Duration: 90 QT Interval:  335 QTC Calculation: 447 R Axis:   60 Text Interpretation: Sinus tachycardia Multiple ventricular premature complexes Anterior infarct, old Repol abnrm suggests ischemia, diffuse leads Confirmed by Marily MemosMesner, Jaylon Boylen [phone]([id number]) on 03/23/2021 5:09:19 AM  Radiology DG Chest 2 View  Result Date: 03/22/2021 CLINICAL DATA:  Shortness of breath. EXAM: CHEST - 2 VIEW COMPARISON:  Chest radiograph dated 02/22/2019 FINDINGS: Right-sided central venous line with tip at the cavoatrial junction. Diffuse interstitial prominence may represent edema or atypical pneumonia. No lobar consolidation. Probable small bilateral pleural effusions. No pneumothorax. The cardiac silhouette is within limits. No acute osseous pathology. IMPRESSION: Small bilateral pleural effusions and interstitial edema. Pneumonia is not excluded. Electronically Signed   By: Elgie CollardArash  Radparvar M.D.   On: 03/22/2021 23:51    Procedures Procedures   Medications Ordered in ED Medications  furosemide (LASIX) injection 80 mg (80 mg Intravenous Given 03/23/21 0406)    ED Course  I have reviewed the triage vital signs and the nursing notes.  Pertinent labs & imaging results that were available during my care of the patient were reviewed by me and considered in my medical decision making (see chart for details).  MDM  Rules/Calculators/A&P                         Fluid overload. Rising troponins without chest pain --> d/w cardiology, will consult but not recommending heparin at this time.   D/w IM teaching service, will see for admit.   Final Clinical Impression(s) / ED Diagnoses Final diagnoses:  NSTEMI (non-ST elevated myocardial infarction) (HCC)  Hypervolemia, unspecified hypervolemia type    Rx / DC Orders ED Discharge Orders     None        Jaszmine Navejas, Barbara Cower, MD 03/23/21 [phone]

## 2021-03-24 ENCOUNTER — Observation Stay (HOSPITAL_COMMUNITY): Payer: Self-pay

## 2021-03-24 ENCOUNTER — Encounter: Payer: Medicaid Other | Admitting: Student

## 2021-03-24 DIAGNOSIS — I5023 Acute on chronic systolic (congestive) heart failure: Secondary | ICD-10-CM

## 2021-03-24 DIAGNOSIS — I214 Non-ST elevation (NSTEMI) myocardial infarction: Secondary | ICD-10-CM

## 2021-03-24 HISTORY — PX: IR REMOVAL TUN CV CATH W/O FL: IMG2289

## 2021-03-24 LAB — BASIC METABOLIC PANEL
Anion gap: 8 (ref 5–15)
BUN: 20 mg/dL (ref 6–20)
CO2: 25 mmol/L (ref 22–32)
Calcium: 8 mg/dL — ABNORMAL LOW (ref 8.9–10.3)
Chloride: 103 mmol/L (ref 98–111)
Creatinine, Ser: 2.07 mg/dL — ABNORMAL HIGH (ref 0.61–1.24)
GFR, Estimated: 39 mL/min — ABNORMAL LOW (ref >60)
Glucose, Bld: 145 mg/dL — ABNORMAL HIGH (ref 70–99)
Potassium: 3.8 mmol/L (ref 3.5–5.1)
Sodium: 136 mmol/L (ref 135–145)

## 2021-03-24 LAB — GLUCOSE, CAPILLARY
Glucose-Capillary: 145 mg/dL — ABNORMAL HIGH (ref 70–99)
Glucose-Capillary: 182 mg/dL — ABNORMAL HIGH (ref 70–99)
Glucose-Capillary: 77 mg/dL (ref 70–99)
Glucose-Capillary: 147 mg/dL — ABNORMAL HIGH (ref 70–99)
Glucose-Capillary: 160 mg/dL — ABNORMAL HIGH (ref 70–99)

## 2021-03-24 LAB — COMPREHENSIVE METABOLIC PANEL
ALT: 11 U/L (ref 0–44)
AST: 11 U/L — ABNORMAL LOW (ref 15–41)
Albumin: 1.7 g/dL — ABNORMAL LOW (ref 3.5–5.0)
Alkaline Phosphatase: 55 U/L (ref 38–126)
Anion gap: 7 (ref 5–15)
BUN: 19 mg/dL (ref 6–20)
CO2: 26 mmol/L (ref 22–32)
Calcium: 7.9 mg/dL — ABNORMAL LOW (ref 8.9–10.3)
Chloride: 103 mmol/L (ref 98–111)
Creatinine, Ser: 2.17 mg/dL — ABNORMAL HIGH (ref 0.61–1.24)
GFR, Estimated: 37 mL/min — ABNORMAL LOW (ref >60)
Glucose, Bld: 198 mg/dL — ABNORMAL HIGH (ref 70–99)
Potassium: 3.9 mmol/L (ref 3.5–5.1)
Sodium: 136 mmol/L (ref 135–145)
Total Bilirubin: 0.7 mg/dL (ref 0.3–1.2)
Total Protein: 5.5 g/dL — ABNORMAL LOW (ref 6.5–8.1)

## 2021-03-24 LAB — CBC
HCT: 21.4 % — ABNORMAL LOW (ref 39.0–52.0)
HCT: 24.9 % — ABNORMAL LOW (ref 39.0–52.0)
Hemoglobin: 6.9 g/dL — CL (ref 13.0–17.0)
Hemoglobin: 8 g/dL — ABNORMAL LOW (ref 13.0–17.0)
MCH: 29.9 pg (ref 26.0–34.0)
MCH: 29.5 pg (ref 26.0–34.0)
MCHC: 32.2 g/dL (ref 30.0–36.0)
MCHC: 32.1 g/dL (ref 30.0–36.0)
MCV: 92.6 fL (ref 80.0–100.0)
MCV: 91.9 fL (ref 80.0–100.0)
Platelets: 226 10*3/uL (ref 150–400)
Platelets: 249 10*3/uL (ref 150–400)
RBC: 2.31 MIL/uL — ABNORMAL LOW (ref 4.22–5.81)
RBC: 2.71 MIL/uL — ABNORMAL LOW (ref 4.22–5.81)
RDW: 15.6 % — ABNORMAL HIGH (ref 11.5–15.5)
RDW: 15.4 % (ref 11.5–15.5)
WBC: 8.4 10*3/uL (ref 4.0–10.5)
WBC: 8.4 10*3/uL (ref 4.0–10.5)
nRBC: 0 % (ref 0.0–0.2)
nRBC: 0 % (ref 0.0–0.2)

## 2021-03-24 LAB — COOXEMETRY PANEL
Carboxyhemoglobin: 1.7 % — ABNORMAL HIGH (ref 0.5–1.5)
Methemoglobin: 0.9 % (ref 0.0–1.5)
O2 Saturation: 54.8 %
Total hemoglobin: 7.2 g/dL — ABNORMAL LOW (ref 12.0–16.0)

## 2021-03-24 LAB — MAGNESIUM: Magnesium: 1.3 mg/dL — ABNORMAL LOW (ref 1.7–2.4)

## 2021-03-24 LAB — ECHOCARDIOGRAM COMPLETE
Height: 75 in
S' Lateral: 5.5 cm
Weight: 4317.49 oz

## 2021-03-24 LAB — PREPARE RBC (CROSSMATCH)

## 2021-03-24 LAB — TROPONIN I (HIGH SENSITIVITY): Troponin I (High Sensitivity): 220 ng/L (ref <18)

## 2021-03-24 MED ORDER — MAGNESIUM SULFATE 4 GM/100ML IV SOLN
4.0000 g | Freq: Once | INTRAVENOUS | Status: AC
  Administered 2021-03-24: 4 g via INTRAVENOUS
  Filled 2021-03-24: qty 100

## 2021-03-24 MED ORDER — INSULIN ASPART 100 UNIT/ML IJ SOLN
3.0000 [IU] | Freq: Three times a day (TID) | INTRAMUSCULAR | Status: DC
  Administered 2021-03-24 – 2021-03-29 (×13): 3 [IU] via SUBCUTANEOUS

## 2021-03-24 MED ORDER — INSULIN ASPART 100 UNIT/ML IJ SOLN: 5.0000 [IU] | Freq: Three times a day (TID) | INTRAMUSCULAR | Status: DC

## 2021-03-24 MED ORDER — SODIUM CHLORIDE 0.9% IV SOLUTION: Freq: Once | INTRAVENOUS | Status: AC

## 2021-03-24 MED ORDER — FUROSEMIDE 10 MG/ML IJ SOLN: 80.0000 mg | Freq: Three times a day (TID) | INTRAMUSCULAR | Status: DC

## 2021-03-24 NOTE — Procedures (Signed)
Tunneled central line removed per order. Please see dictation for full report.  Alwyn Ren, [place] [phone] 03/24/2021, 3:37 PM

## 2021-03-24 NOTE — Progress Notes (Signed)
Dr Ned Card and oncoming RN notified of patient's hemoglobin result (6.9).

## 2021-03-24 NOTE — Progress Notes (Signed)
HD#1 SUBJECTIVE:  Patient Summary: Adam James is a 50 y.o. 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   Overnight Events: No acute events overnight.   Interim History: This is hospital day 1 for Adam James who was seen and evaluated at the bedside this morning. He reports that his breathing is improved compared to yesterday, however, he is still unable to lay flat and continues to get short of breath on exertion. He denies any chest pain or palpitations. Patient also continues to have significant lower extremity edema, most notable in the RLE as his LLE is wrapped to his knee. He denies any pain in his lower extremities and continues to ambulate well.   OBJECTIVE:  Vital Signs: Vitals:   03/23/21 2132 03/24/21 0021 03/24/21 0204 03/24/21 0543  BP: 111/77 100/69 101/76 98/78  Pulse: (!) 108 100 100 95  Resp: 17 16 16 16   Temp: 99.2 F (37.3 C) 99.5 F (37.5 C) 98.3 F (36.8 C) 98.5 F (36.9 C)  TempSrc: Oral Oral Oral Oral  SpO2: 95% 96% 96% 95%  Weight:    122.4 kg  Height:       Supplemental O2: Room Air SpO2: 95 %  Filed Weights   03/23/21 1031 03/24/21 0543  Weight: 122.1 kg 122.4 kg     Intake/Output Summary (Last 24 hours) at 03/24/2021 0659 Last data filed at 03/24/2021 0200 Gross per 24 hour  Intake 680 ml  Output 775 ml  Net -95 ml   Net IO Since Admission: -95 mL [03/24/21 0659]  Physical Exam:  General: Alert, well-appearing male laying in bed. No acute distress. CV: RRR. No murmurs, rubs, or gallops. 2+ pitting edema bilateral lower extremities. +JVD  Pulmonary: Lungs CTAB. Normal effort. No wheezing or rales. Abdominal: Soft, nontender, nondistended. Normal bowel sounds. Extremities: Palpable radial  and DP pulses. Decreased bulk and tone of left leg s/p extensive debridement and resection of lateral compartment.  Skin: Warm and dry. No obvious rash or lesions. Neuro: A&Ox3. Moves all extremities. Normal sensation. No focal deficit. Psych: Normal mood and affect    ASSESSMENT/PLAN:  Assessment: Principal Problem:   Acute on chronic heart failure (HCC) Active Problems:   Type 2 diabetes mellitus (HCC)   Gangrene of left foot (HCC)   Necrotizing fasciitis (HCC)   Diabetic polyneuropathy associated with type 2 diabetes mellitus (HCC)   NSTEMI (non-ST elevated myocardial infarction) (HCC)   Plan: #Acute on Chronic HFrEF exacerbation  Last LVEF 30-35 on 02/20/21. Patient has not been adherent to fluid restriction or a low salt diet presenting with SOB, orthopnea, lower extremity edema and a 6-7 lbs weight gain over several days. His breathing is improved today and remains on room air, however, he continues to have dyspnea on exertion, orthopnea, and lower extremity edema. Diuresed with 80mg  IV lasix yesterday, with no significant urine output (~775 cc recorded).  - Cardiology consulted, appreciate recommendations  - Repeat echo today - Holding lasix given bump in Cr  - Fluid restriction to 1.5 L/day - Strict I&Os and daily weights - Consulted TOC heart failure clinic     #Elevated troponin 2/2 demand ischemia #Hx of CAD High sensitivity troponins elevated on admission. Patient continues to remain chest pain free. Cards consulted and believe troponin elevation is likely  secondary to demand ischemia. Not recommending heparin given recent severe GI bleed requiring transfusion. Patient did have a Hb drop this morning to 6.9 and was transfused with 1u PRBC. Recommended to transfuse for Hb <7. - Cardiology following, appreciate recommendations  - Recheck troponin   #Anemia likely 2/2 to erosive esophagitis Patient intermittently required RBC transfusions during the course of his last  hospitalization. EGD performed showed severe erosive esophagitis and patient was started on PPI and carafate and recommended to have repeat EGD in 8 weeks. Had Hb drop to 6.9 this morning. Patient denies any hematuria, melena, or hematochezia at this time. - Transfused with 1u PRBC - Continue to monitor CBC - Transfuse if Hb <7 - Will consider GI consult if Hb continues to drop/no improvement   #T2DM with diabetic neuropathy  HbA1c 8.8%.Goal blood sugar < 180 to promote wound healing.  -15u semglee bedtime, novoLOG 8u tid with meals, and SSI - Hold metformin due to AKI - CBG monitoring    #Acute kidney injury on chronic Kidney diease stage 3a Cr increased to 2.17 this morning, from 1.9 yesterday (baseline around 1.6). Likely from volume overload in setting of acute on chronic HFrEF exacerbation, although the Cr did bump with lasix use yesterday.  - Repeat BMP 2pm - Avoid nephrotoxic medications    #Hx of diabetic foot ulcer #Hx of gas gangrene and necrotizing fascitis of left foot Patient is s/p extensive debridement and anterior lateral compartment resection of left foot with ortho. He is afebrile, vitals are stable. No leukocytosis. He has been performing daily dressing changes, and notes small amount of white discharge from operation site but no odor. ID is following this patient again and recommended continuation of his home oral antibiotics at this time, although he did receive IV unasyn yesterday.  - ID consulted, appreciate recommendations - continuing doxycyline and Augmentin  - Ortho consulted for reevaluation of leg, appreciate recommendations - Tunneled cath to be removed today by IR as patient no longer is on IV abx - Wound care consult - Daily dressing changes - Trend CBC     Best Practice: Diet: Cardiac diet IVF: Fluids: none VTE: enoxaparin (LOVENOX) injection 40 mg Start: 03/23/21 1000 Code: Full AB: Doxy and augmentin DISPO: Anticipated discharge to Home pending   clinical improvement .  Signature: Elza Rafter, D.O.  Internal Medicine Resident, PGY-1 [facility]  Pager: [phone] 6:59 AM, 03/24/2021   Please contact the on call pager after 5 pm and on weekends at [phone].

## 2021-03-24 NOTE — Progress Notes (Signed)
Echocardiogram 2D Echocardiogram has been performed.   Warren Lacy Ayomide Purdy RDCS 03/24/2021, 11:30 AM

## 2021-03-24 NOTE — Progress Notes (Signed)
Patient reports feeling sweaty and lightheaded, requested for blood glucose check. "I just feel horrible." Onset during ambulation from bathroom post bowel movement. Patient in bed upon arrival to room. No obvious signs of distress. Vital signs as noted in flowsheets. Capillary blood glucose 145. Symptoms spontaneously resolved. Requested something to eat due to eating very little at dinner. Snack provided. Instructed to call nursing staff for standby observation during future ambulation for safety.

## 2021-03-24 NOTE — Progress Notes (Addendum)
Progress Note  Patient Name: Adam James Date of Encounter: 03/24/2021  [facility] Cardiologist: Norman Herrlich, MD   Subjective   Pt denies CP   Breathing is fair   Still wit hswelling    Inpatient Medications    Scheduled Meds:  sodium chloride   Intravenous Once   amoxicillin-clavulanate  1 tablet Oral Q12H   aspirin EC  81 mg Oral Daily   atorvastatin  80 mg Oral Daily   Chlorhexidine Gluconate Cloth  6 each Topical Daily   doxycycline  100 mg Oral Q12H   enoxaparin (LOVENOX) injection  40 mg Subcutaneous Daily   furosemide  80 mg Intravenous BID   insulin aspart  0-15 Units Subcutaneous TID WC   insulin aspart  0-5 Units Subcutaneous QHS   insulin aspart  8 Units Subcutaneous TID WC   insulin glargine-yfgn  15 Units Subcutaneous QHS   metoprolol succinate  50 mg Oral Daily   pantoprazole  40 mg Oral BID AC   silver sulfADIAZINE  1 application Topical Daily   sucralfate  1 g Oral TID WC & HS   Continuous Infusions:  sodium chloride     magnesium sulfate bolus IVPB     PRN Meds: sodium chloride, sodium chloride flush   Vital Signs    Vitals:   03/24/21 0021 03/24/21 0204 03/24/21 0543 03/24/21 0745  BP: 100/69 101/76 98/78 107/66  Pulse: 100 100 95 98  Resp: 16 16 16 14   Temp: 99.5 F (37.5 C) 98.3 F (36.8 C) 98.5 F (36.9 C) 97.9 F (36.6 C)  TempSrc: Oral Oral Oral Oral  SpO2: 96% 96% 95% 95%  Weight:   122.4 kg   Height:        Intake/Output Summary (Last 24 hours) at 03/24/2021 0835 Last data filed at 03/24/2021 0828 Gross per 24 hour  Intake 800 ml  Output 775 ml  Net 25 ml   Last 3 Weights 03/24/2021 03/23/2021 03/15/2021  Weight (lbs) 269 lb 13.5 oz 269 lb 3.4 oz 272 lb 0.8 oz  Weight (kg) 122.4 kg 122.113 kg 123.4 kg      Telemetry    SR with PVC   - Personally Reviewed  ECG   NO new   - Personally Reviewed  Physical Exam   GEN: No acute distress.   Neck: JVP is increased Cardiac: RRR, no murmurs, rubs,  Respiratory:  Clear to auscultation bilaterally. GI: Soft, nontender, non-distended  MS: 1-2 +  edema; No deformity. Neuro:  Nonfocal  Psych: Normal affect   Labs    High Sensitivity Troponin:   Recent Labs  Lab 02/24/21 1934 03/23/21 0044 03/23/21 0113 03/23/21 0451 03/23/21 1402  TROPONINIHS 27* 234* 280* 361* 319*      Chemistry Recent Labs  Lab 03/23/21 0044 03/24/21 0540  NA 138 136  K 3.9 3.9  CL 106 103  CO2 23 26  GLUCOSE 145* 198*  BUN 22* 19  CREATININE 1.90* 2.17*  CALCIUM 8.4* 7.9*  PROT  --  5.5*  ALBUMIN  --  1.7*  AST  --  11*  ALT  --  11  ALKPHOS  --  55  BILITOT  --  0.7  GFRNONAA 43* 37*  ANIONGAP 9 7     Hematology Recent Labs  Lab 03/23/21 0044 03/24/21 0540  WBC 9.7 8.4  RBC 2.81* 2.31*  HGB 8.3* 6.9*  HCT 26.5* 21.4*  MCV 94.3 92.6  MCH 29.5 29.9  MCHC 31.3 32.2  RDW  15.6* 15.6*  PLT 341 226    BNP Recent Labs  Lab 03/23/21 0045  BNP 1,129.9*     DDimer No results for input(s): DDIMER in the last 168 hours.   Radiology    DG Chest 2 View  Result Date: 03/22/2021 CLINICAL DATA:  Shortness of breath. EXAM: CHEST - 2 VIEW COMPARISON:  Chest radiograph dated 02/22/2019 FINDINGS: Right-sided central venous line with tip at the cavoatrial junction. Diffuse interstitial prominence may represent edema or atypical pneumonia. No lobar consolidation. Probable small bilateral pleural effusions. No pneumothorax. The cardiac silhouette is within limits. No acute osseous pathology. IMPRESSION: Small bilateral pleural effusions and interstitial edema. Pneumonia is not excluded. Electronically Signed   By: Elgie Collard M.D.   On: 03/22/2021 23:51    Cardiac Studies   Echo 02/20/21 1. Anterior, anteroseptal and inferoseptal hypokinesis. Left ventricular  ejection fraction, by estimation, is 30 to 35%. The left ventricle has  moderately decreased function. The left ventricle demonstrates regional  wall motion abnormalities (see  scoring  diagram/findings for description). There is mild concentric left  ventricular hypertrophy. Left ventricular diastolic parameters are  consistent with Grade I diastolic dysfunction (impaired relaxation).   2. Right ventricular systolic function is normal. The right ventricular  size is normal. There is normal pulmonary artery systolic pressure.   3. The mitral valve is normal in structure. Trivial mitral valve  regurgitation. No evidence of mitral stenosis.   4. The aortic valve is tricuspid. Aortic valve regurgitation is trivial.  No aortic stenosis is present.   5. Aortic dilatation noted. There is borderline dilatation of the aortic  root and of the ascending aorta, measuring 36 mm.   6. The inferior vena cava is normal in size with greater than 50%  respiratory variability, suggesting right atrial pressure of 3 mmHg.      LEFT HEART CATH AND CORONARY ANGIOGRAPHY  08/2020    Conclusion     Mid LM to Dist LM lesion is 50% stenosed. Ramus lesion is 70% stenosed. 1st Mrg lesion is 50% stenosed. Prox Cx lesion is 70% stenosed. Prox RCA to Mid RCA lesion is 100% stenosed. Dist RCA lesion is 100% stenosed. Ost LAD to Mid LAD lesion is 90% stenosed. Dist LAD lesion is 100% stenosed. Mid LAD to Dist LAD lesion is 90% stenosed.   Severe multivessel coronary obstructive disease with smooth 50% distal left main stenosis prior to trifurcating into the LAD, ramus intermediate, and left circumflex vessel.     The proximal LAD stent had diffuse 80-90 and 95% stenoses.  The LAD beyond the stent that segment had diffuse narrowing of 90 to 95% and then was totally occluded after a small second diagonal branch; 70% proximal ramus intermediate stenosis; 70% LAD stenosis with 50% stenosis in the OM vessel prior to its bifurcation; and total mid RCA occlusion with bridging collaterals abd total distal occlusion before the PDA takeoff with left to right collaterals supplying the PDA and PLA vessel.    LVEDP 15 mm Hg.   RECOMMENDATION: We will obtain a 2D echo Doppler study today.  Plan for discharge later today with imminent surgical consultation as an outpatient for CABG revascularization.  In the interim, will add amlodipine for anti-ischemic benefit to his current metoprolol dose, increase statin therapy to atorvastatin 80 mg.  Smoking cessation was strongly recommended.     Patient Profile     50 y.o. male  wutg severe CAD (recomm for CABG), Type II DMPAD, CKD, systolic CHF  Assessment & Plan    1  Acute on chronic systolic CHF   Echo in July 2022  LVEF 30 to 35%  Felt due to not taking lasix after recent DC   Now with increased volume on exam   DId not respond much to lasix    (80 tid)  HOld for now given bump in cr with this dose     CHeck labs in PM    Check COOX   He has a cnetral line   Echo pending    Low NA diet, 1500 cc   2  NSTEMI    Pt without CP    Probably reflects demand ischemia (anemia, CHF, known CAD)    Would transfuse for Hgb less than 7   Recheck troponin      3  ANemia  Hgb has dropped today   WOUld tranfuse to keep greater than   Defer to primary team   WOuld give lasix with this and follow renal function  Note he is on Lovenox daily 40     4 CKD  STage III    Cr 2.17 today   Up from 1.9  Repeat this PM    5  HL   IN March LDL 108   Trig 125  HDL 36   ON lipitor    6 Gangene  ID following     For questions or updates, please contact [facility] Please consult [email or web address] for contact info under        Signed, Dietrich Pates, MD  03/24/2021, 8:35 AM

## 2021-03-25 ENCOUNTER — Ambulatory Visit (HOSPITAL_COMMUNITY)
Admission: RE | Admit: 2021-03-25 | Discharge: 2021-03-25 | Disposition: A | Discharge status: Home / Self Care | Payer: MEDICAID | Source: Ambulatory Visit | Attending: Internal Medicine | Admitting: Internal Medicine

## 2021-03-25 DIAGNOSIS — E1142 Type 2 diabetes mellitus with diabetic polyneuropathy: Secondary | ICD-10-CM

## 2021-03-25 DIAGNOSIS — M869 Osteomyelitis, unspecified: Secondary | ICD-10-CM

## 2021-03-25 DIAGNOSIS — I872 Venous insufficiency (chronic) (peripheral): Secondary | ICD-10-CM

## 2021-03-25 DIAGNOSIS — L97521 Non-pressure chronic ulcer of other part of left foot limited to breakdown of skin: Secondary | ICD-10-CM

## 2021-03-25 DIAGNOSIS — I5042 Chronic combined systolic (congestive) and diastolic (congestive) heart failure: Secondary | ICD-10-CM

## 2021-03-25 DIAGNOSIS — L97211 Non-pressure chronic ulcer of right calf limited to breakdown of skin: Secondary | ICD-10-CM

## 2021-03-25 DIAGNOSIS — I509 Heart failure, unspecified: Secondary | ICD-10-CM

## 2021-03-25 DIAGNOSIS — M86272 Subacute osteomyelitis, left ankle and foot: Secondary | ICD-10-CM

## 2021-03-25 DIAGNOSIS — I5043 Acute on chronic combined systolic (congestive) and diastolic (congestive) heart failure: Secondary | ICD-10-CM

## 2021-03-25 LAB — FUNGUS CULTURE WITH STAIN

## 2021-03-25 LAB — TYPE AND SCREEN
ABO/RH(D): A NEG
Antibody Screen: NEGATIVE
Unit division: 0

## 2021-03-25 LAB — BASIC METABOLIC PANEL
Anion gap: 9 (ref 5–15)
BUN: 20 mg/dL (ref 6–20)
CO2: 25 mmol/L (ref 22–32)
Calcium: 8.1 mg/dL — ABNORMAL LOW (ref 8.9–10.3)
Chloride: 101 mmol/L (ref 98–111)
Creatinine, Ser: 2.06 mg/dL — ABNORMAL HIGH (ref 0.61–1.24)
GFR, Estimated: 39 mL/min — ABNORMAL LOW (ref >60)
Glucose, Bld: 176 mg/dL — ABNORMAL HIGH (ref 70–99)
Potassium: 3.8 mmol/L (ref 3.5–5.1)
Sodium: 135 mmol/L (ref 135–145)

## 2021-03-25 LAB — BPAM RBC
Blood Product Expiration Date: 202209212359
ISSUE DATE / TIME: 202208291202
Unit Type and Rh: 600

## 2021-03-25 LAB — CBC
HCT: 24.5 % — ABNORMAL LOW (ref 39.0–52.0)
Hemoglobin: 7.9 g/dL — ABNORMAL LOW (ref 13.0–17.0)
MCH: 29.5 pg (ref 26.0–34.0)
MCHC: 32.2 g/dL (ref 30.0–36.0)
MCV: 91.4 fL (ref 80.0–100.0)
Platelets: 249 10*3/uL (ref 150–400)
RBC: 2.68 MIL/uL — ABNORMAL LOW (ref 4.22–5.81)
RDW: 15.6 % — ABNORMAL HIGH (ref 11.5–15.5)
WBC: 8.1 10*3/uL (ref 4.0–10.5)
nRBC: 0 % (ref 0.0–0.2)

## 2021-03-25 LAB — GLUCOSE, CAPILLARY
Glucose-Capillary: 156 mg/dL — ABNORMAL HIGH (ref 70–99)
Glucose-Capillary: 146 mg/dL — ABNORMAL HIGH (ref 70–99)
Glucose-Capillary: 138 mg/dL — ABNORMAL HIGH (ref 70–99)
Glucose-Capillary: 160 mg/dL — ABNORMAL HIGH (ref 70–99)

## 2021-03-25 LAB — MAGNESIUM: Magnesium: 1.9 mg/dL (ref 1.7–2.4)

## 2021-03-25 LAB — FUNGAL ORGANISM REFLEX

## 2021-03-25 LAB — FUNGUS CULTURE RESULT

## 2021-03-25 MED ORDER — ASPIRIN 81 MG PO CHEW
81.0000 mg | CHEWABLE_TABLET | ORAL | Status: AC
  Administered 2021-03-26: 81 mg via ORAL
  Filled 2021-03-25: qty 1

## 2021-03-25 MED ORDER — FUROSEMIDE 10 MG/ML IJ SOLN
100.0000 mg | Freq: Three times a day (TID) | INTRAVENOUS | Status: DC
  Administered 2021-03-25: 100 mg via INTRAVENOUS
  Filled 2021-03-25 (×3): qty 10

## 2021-03-25 MED ORDER — FUROSEMIDE 10 MG/ML IJ SOLN
80.0000 mg | Freq: Three times a day (TID) | INTRAMUSCULAR | Status: DC
  Filled 2021-03-25: qty 8

## 2021-03-25 MED ORDER — SODIUM CHLORIDE 0.9% FLUSH: 3.0000 mL | INTRAVENOUS | Status: DC | PRN

## 2021-03-25 MED ORDER — SODIUM CHLORIDE 0.9 % IV SOLN: 250.0000 mL | INTRAVENOUS | Status: DC | PRN

## 2021-03-25 MED ORDER — METOLAZONE 5 MG PO TABS
5.0000 mg | ORAL_TABLET | Freq: Every day | ORAL | Status: DC
  Administered 2021-03-25: 5 mg via ORAL
  Filled 2021-03-25: qty 1

## 2021-03-25 MED ORDER — FUROSEMIDE 10 MG/ML IJ SOLN
100.0000 mg | Freq: Two times a day (BID) | INTRAVENOUS | Status: AC
  Administered 2021-03-25 – 2021-03-27 (×5): 100 mg via INTRAVENOUS
  Filled 2021-03-25 (×5): qty 10

## 2021-03-25 MED ORDER — SODIUM CHLORIDE 0.9% FLUSH
3.0000 mL | Freq: Two times a day (BID) | INTRAVENOUS | Status: DC
  Administered 2021-03-25 – 2021-03-28 (×4): 3 mL via INTRAVENOUS

## 2021-03-25 MED ORDER — SODIUM CHLORIDE 0.9 % IV SOLN: INTRAVENOUS | Status: DC

## 2021-03-25 NOTE — Hospital Course (Addendum)
Adam James is a 50 y.o. 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   #Acute on Chronic HFrEF exacerbation  Last LVEF 30-35% on 02/20/21. Patient has not been adherent to fluid restriction or a low salt diet presenting with SOB, orthopnea, lower extremity edema and a 6-7 lbs weight gain over the last several days prior to admission. His breathing remained stable and he remains on room air, however, he continues to have dyspnea on exertion, orthopnea, and lower extremity edema. Diuresed with 80mg  IV lasix on admission, with no significant urine output (~775 cc recorded). Had bump in Cr after diuresis, and had diuresis held on 8/29. Repeat echo on 8/28 with LVEF of 25%, LV demonstrating global hypokinesis, and LV diastolic parameters consistent with grade II diastolic dysfunction. Restarted on high dose diuresis with 100mg  IV lasix TID and metolazone. Had 3.7L urine output after 1 day of diuresis, although no significant changes in weight noted. He continued to endorse daily improvement in breathing and decreased orthopnea as well as LE edema improvements. Lasix 100 mg IV BID was continued with 3.225L urine output and 4.1 kg decrease in weight on 08/31, 3.1L and 3kg decrease in weight on 09/01, until 09/02 when it was held after completion of LHC. At this time patient was initiated on spironolactone 12.5 mg daily. He continued to have 1.15L urine output and 2.4 kg decrease in weight on 09/02, 1.15L urine output and no significant decrease in weight to this point on 09/03. On day of discharge he initiated torsemide 40 mg daily and will continue this regimen at home.   #Elevated troponin 2/2 demand ischemia #Hx of CAD Cath 08/2020 showed severe multivessel  coronary obstructive disease with smooth 50% distal left main stenosis prior to trifurcating into the LAD, ramus intermediate, and left circumflex vessel. High sensitivity troponins elevated on admission although the patient remained chest pain free. Cardiology consulted and believed troponin elevation was likely secondary to demand ischemia. Heparin was not recommended given recent severe GI bleed requiring transfusion. Patient did have a Hgb drop 08/31 to 6.9 and was transfused with 1u PRBC. Hgb remained stable since. RHC performed on 8/31 and showed preserved cardiac output, mildly elevated PCWP, and mild pulmonary venous hypertension. LHC performed on 09/02 showed Ost LD to Mid LAD 90% stenosed, mid LAD to Dist LAD 90% stenosed, dist LAD lesion 100% stenosed.prox Cx 70% stenosed, prox RCA to Mid RCA 100% stenosed, dist RCA 100% stenosed, ramus 70% stenosed, 1st Mrg 50% stenosed, mid LM to Dist LM 60% stenosed.   #Anemia likely 2/2 to erosive esophagitis Patient intermittently required RBC transfusions during the course of his last hospitalization. EGD performed showed severe erosive esophagitis and patient was started on PPI and carafate and recommended to have repeat EGD in 8 weeks. Had Hgb drop to 6.9 on 8/29 and received 1u PRBC. Hgb has remained stable since transfusion with no hematochezia, melena, blood in urine.  #T2DM with diabetic neuropathy  HbA1c 8.8%.Goal blood sugar <180 to promote wound healing. Metformin held on admission secondary to AKI. Received 15u semglee bedtime, novoLOG 8u tid with meals, and SSI.  #Acute kidney injury on chronic Kidney diease stage 3a Patient was noted to have a bump in Cr to 2.17 on 8/29 from 1.9 08/28 with  previous baseline  around 1.6. Attributed this to volume overload in setting of acute on chronic HFrEF exacerbation, though of note this Cr bump occurred with lasix use on 8/28. Cr slightly improved 2.06 on 8/30 and continued to improve daily with 2.05 08/31 >  2.07 09/01 > 2.01 09/02 > 1.96 09/03. GFR remained relatively unchanged throughout admission with GFR of 41 on day of discharge.  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 He has remained afebrile, with stable vitals and no leukocytosis throughout admission. ID followed this patient again and recommended continuation of his home oral antibiotics of doxycycline and Augmentin at this time, although he did receive IV unasyn on 8/28. Had tunneled cath removed by IR as he is no longer receiving IV antibiotics. Ortho evaluated this patient and recommended compression socks for his right lower extremity and a sock to be worn on his left lower extremity with no dressing between the wound and the sock and to continue with fracture boot on the left. This was done intermittently with patient preferring/insisting on wound dressing to be placed between sock and skin. No significant changes in wounds noted throughout admission.

## 2021-03-25 NOTE — Progress Notes (Addendum)
HD#2 SUBJECTIVE:  Patient Summary: Adam James is a 50 y.o. 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   Overnight Events: No acute events overnight.  Interim History: This is hospital day 2 for Adam James who was see and evaluated at the bedside this morning. He reports a subjective improvement in his breathing, although he is still unable to lay flat. He does still have lower extremity edema, and has not had much of an improvement with this. Had minimal urine output over the last 24 hrs and no change in weight.   OBJECTIVE:  Vital Signs: Vitals:   03/24/21 1412 03/24/21 1614 03/24/21 2140 03/25/21 0053  BP: 102/76 122/79 107/70 112/72  Pulse: 94 (!) 101 (!) 103 (!) 107  Resp: 18 18 18 17   Temp: 98.7 F (37.1 C) 98.7 F (37.1 C) 97.9 F (36.6 C) 98.6 F (37 C)  TempSrc: Oral Oral Oral Oral  SpO2: 94% 94% 94% 94%  Weight:      Height:       Supplemental O2: Room Air SpO2: 94 %  Filed Weights   03/23/21 1031 03/24/21 0543  Weight: 122.1 kg 122.4 kg     Intake/Output Summary (Last 24 hours) at 03/25/2021 0604 Last data filed at 03/24/2021 2344 Gross per 24 hour  Intake 1070.11 ml  Output 425 ml  Net 645.11 ml   Net IO Since Admission: 550.11 mL [03/25/21 0604]  Physical Exam: General: Alert, well-appearing male laying in bed. No acute distress. CV: RRR. No murmurs, rubs, or gallops. 1-2+ pitting edema bilateral lower extremities. +JVD  Pulmonary: Lungs CTAB. Normal effort. No wheezing or rales. Abdominal: Soft, nontender, nondistended. Normal bowel sounds. Extremities: Palpable radial and DP pulses. Decreased bulk and tone of left leg s/p extensive debridement and resection of lateral compartment.  Skin: Warm and  dry. No obvious rash or lesions. Neuro: A&Ox3. Moves all extremities. Normal sensation. No focal deficit. Psych: Normal mood and affect  ASSESSMENT/PLAN:  Assessment: Principal Problem:   Acute on chronic heart failure (HCC) Active Problems:   Type 2 diabetes mellitus (HCC)   Gangrene of left foot (HCC)   Necrotizing fasciitis (HCC)   Diabetic polyneuropathy associated with type 2 diabetes mellitus (HCC)   NSTEMI (non-ST elevated myocardial infarction) (HCC)   Plan: #Acute on chronic combined systolic and diastolic heart failure Echo performed yesterday with LVEF of 25%, LV demonstrating global hypokinesis, and LV diastolic parameters consistent with grade II diastolic dysfunction. Patient had a bump in Cr after being with diuresed on hospital day 0, and thus had diuresis held yesterday.  - Cardiology consulted, appreciate recommendations  - Consider advanced heart failure team consultation - Resume lasix 80 mg TID today  - Fluid restriction to 1.5 L/day - Strict I&Os and daily weights   #Elevated troponin 2/2 demand ischemia #Hx of CAD Patient continues to deny any active chest pain or palpitations. Troponin rechecked yesterday and is still elevated, although it has down-trended. Heparin was not initiated due to the patient's anemia and drop in Hb yesterday. Patient is s/p 1u PRBC and Hb remains stable at 7.9.  - Cardiology following, appreciate recommendations   #Anemia likely 2/2 to erosive esophagitis Patient intermittently required RBC transfusions during the course of his last hospitalization. EGD performed showed severe erosive esophagitis  and patient was started on PPI and carafate and recommended to have repeat EGD in 8 weeks. Patient denies any hematuria, melena, or hematochezia at this time. S/p 1 unit of blood yesterday and Hb increased appropriately from 6.9 to 8.1. Hb stable at 7.9 today.  - Continue to monitor CBC - Transfuse if Hb <7 - Will consider GI consult if Hb  continues to drop/no improvement   #T2DM with diabetic neuropathy  HbA1c 8.8%.Goal blood sugar < 180 to promote wound healing. CBGs 140-170s over the last 24h.  -15u semglee bedtime, novoLOG 8u tid with meals, and SSI - Hold metformin due to AKI - CBG monitoring    #Acute kidney injury on chronic Kidney diease stage 3a Cr slightly improved to 2.06 from 2.17 yesterday. Likely from volume overload in setting of acute on chronic HFrEF exacerbation, although the Cr did bump with lasix use on 8/28. Will resume diuresis today. 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 He is afebrile, vitals are stable. No leukocytosis. Resumed home oral antibiotics at this time per ID. Ortho evaluated this patient and is recommendation compression socks for his right lower extremity and a sock to be worn on his left lower extremity with no dressing between the wound and the sock. Also recommended to continue with fracture boot on the left. Patient had tunneled cath removed by IR yesterday as he is no longer receiving IV antibiotics.  - ID consulted, appreciate recommendations - continuing doxycyline and Augmentin  - Ortho consulted, appreciate recommendations - Wound care consult - Trend CBC      Best Practice: Diet: Cardiac diet IVF: Fluids: none VTE: enoxaparin (LOVENOX) injection 40 mg Start: 03/23/21 1000 Code: Full AB: Doxy and augmentin DISPO: Anticipated discharge to Home pending  clinical improvement .  Signature: Adam James, D.O.  Internal Medicine Resident, PGY-1 [facility]  Pager: [phone] 6:04 AM, 03/25/2021   Please contact the on call pager after 5 pm and on weekends at [phone].

## 2021-03-25 NOTE — Consult Note (Addendum)
Advanced Heart Failure Team Consult Note   Primary Physician: Patient, No Pcp Per (Inactive) PCP-Cardiologist:  Norman Herrlich, MD  Reason for Consultation: A/C Systolic Heart Failure   HPI:    Adam James is seen today for evaluation of A/C combined systolic/diastolic heart failure at the request of Dr Timoteo Gaul.   Adam James is a 50 year old with a history CAD, Had PCI stent 2007 in [place]. 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  Admitted to [facility] in February lower extremity edema and foot ulcer. BNP and HS Trop elevated and felt to be non-ACS.  Cardiology consulted and he was placed on lasix.   He had follow up with Dr Dulce Sellar 09/09/20 and was set up for cath. Cath with multivessel CAD. Saw Dr Cliffton Asters in the office with plans for CMRI. He was referred to SW due to financial concerns and no payor source. CMRI showed EF 40% and viable myocardium.    Saw Dr Cliffton Asters in July and was set up for CABG in August. Pre CABG labs completed and showed WBC 25. He was advised to go to the ED.   Admitted 02/25/2021 with left foot infection complicated by osteomyelitis and necrotizing fasciitis. Multiple debridements for limb salvage. Hospital course complicated by erosive esophagitis on EGD. Recommendations for protonix 40 mg twice a day + repeat EGD in 8 week. .Received multiple transfusions.Discharged 03/15/21.   Presented to [facility] ED 03/23/21 with exertion dyspnea/orthopnea. CXR with interstitial edema.  Pertinent admission labs included: BNP 1129, HS TRop 234>280> 361, creatinine 1.9 , hgb 8.3. Started on IV lasix. Cardiology consulted. CO-OX 55%.   Tunneled catheter removed 8/29. Given 80 mg IV x1. Today IV lasix increased to 100 mg tid and started on metolazone.   Ortho/ID consulted. Wounds healing without s/s infection. Ortho recommended RLE post op shoe and  place fracture boot on left. ID recommended to continue doxycycline + augmentin.   Hgb trending down 8.3>6.9 transfused 1uPRBC >8  Remains SOB with exertion + orthopnea.   Cardiac Testing Echo 03/24/21 EF 25%  Grade II DD RV mildly reduced.  Echo 01/2021-EF 30-35% Grade I DD RV normal  Echo 08/2020- EF 40-45% Grade 1DD RV normal   CMRI 10/2020 -EF40%. Viable myocardium.   LHC 08/2020  Mid LM to Dist LM lesion is 50% stenosed. Ramus lesion is 70% stenosed. 1st Mrg lesion is 50% stenosed. Prox Cx lesion is 70% stenosed. Prox RCA to Mid RCA lesion is 100% stenosed. Dist RCA lesion is 100% stenosed. Ost LAD to Mid LAD lesion is 90% stenosed. Dist LAD lesion is 100% stenosed. Mid LAD to Dist LAD lesion is 90% stenosed.   Severe multivessel coronary obstructive disease with smooth 50% distal left main stenosis prior to trifurcating into the LAD, ramus intermediate, and left circumflex vessel.   Review of Systems: [y] = yes,  = no   General: Weight gain ; Weight loss ; Anorexia ; Fatigue [Y ]; Fever ; Chills ; Weakness   Cardiac: Chest pain/pressure ; Resting SOB ; Exertional SOB [ Y]; Orthopnea [ Y]; Pedal Edema [Y ]; Palpitations ; Syncope ; Presyncope ; Paroxysmal nocturnal dyspnea[ ]   Pulmonary: Cough ; Wheezing[ ] ; Hemoptysis[ ] ; Sputum ; Snoring   GI: Vomiting[ ] ; Dysphagia[ ] ; Melena[ ] ; Hematochezia ; Heartburn[ ] ; Abdominal pain ; Constipation ;  Diarrhea [ ] ; BRBPR [ ]   GU: Hematuria[ ] ; Dysuria [ ] ; Nocturia[ ]   Vascular: Pain in legs with walking [ ] ; Pain in feet with lying flat [ ] ; Non-healing sores [Y ]; Stroke [ ] ; TIA [ ] ; Slurred speech [ ] ;  Neuro: Headaches[ ] ; Vertigo[ ] ; Seizures[ ] ; Paresthesias[ ] ;Blurred vision [ ] ; Diplopia [ ] ; Vision changes [ ]   Ortho/Skin: Arthritis [ ] ; Joint pain [Y ]; Muscle pain [ ] ; Joint swelling [ ] ; Back Pain [ ] ; Rash [ ]   Psych: Depression[ ] ; Anxiety[ ]   Heme: Bleeding problems [ ] ;  Clotting disorders [ ] ; Anemia [ Y]  Endocrine: Diabetes [ Y]; Thyroid dysfunction[ ]   Home Medications Prior to Admission medications   Medication Sig Start Date End Date Taking? Authorizing Provider  amLODipine (NORVASC) 5 MG tablet Take 1 tablet (5 mg total) by mouth daily. 02/20/21  Yes , MD  amoxicillin-clavulanate (AUGMENTIN) 875-125 MG tablet Take 1 tablet by mouth 2 (two) times daily. 03/14/21 04/18/21 Yes Atway, Rayann N, DO  aspirin EC 81 MG tablet Take 1 tablet (81 mg total) by mouth daily. Swallow whole. 02/20/21  Yes , MD  atorvastatin (LIPITOR) 80 MG tablet Take 1 tablet (80 mg total) by mouth daily. 02/20/21  Yes , MD  doxycycline (VIBRA-TABS) 100 MG tablet Take 1 tablet (100 mg total) by mouth 2 (two) times daily. 03/14/21 04/18/21 Yes Atway, Rayann N, DO  furosemide (LASIX) 40 MG tablet Take 1 tablet (40 mg total) by mouth daily. Take 40 mg every other day alternating with 2 tablets (80mg ) every other day Patient taking differently: Take 40 mg by mouth as directed. Take 40 mg every other day alternating with 2 tablets (80mg ) every other day 03/19/21  Yes , MD  glyBURIDE (DIABETA) 5 MG tablet Take 5 mg by mouth 2 (two) times daily. 10/12/20  Yes [provider]  irbesartan (AVAPRO) 75 MG tablet Take 1 tablet (75 mg total) by mouth daily. 01/20/21  Yes , MD  metFORMIN (GLUCOPHAGE) 500 MG tablet Take 500 mg by mouth 2 (two) times daily. 10/12/20  Yes [provider]  metoprolol succinate (TOPROL-XL) 50 MG 24 hr tablet Take 1 tablet (50 mg total) by mouth daily. Take with or immediately following a meal. 03/15/21 04/14/21 Yes Atway, Rayann N, DO  pantoprazole (PROTONIX) 40 MG tablet Take 1 tablet (40 mg total) by mouth 2 (two) times daily before a meal. 03/14/21 04/13/21 Yes Atway, Rayann N, DO  silver sulfADIAZINE (SILVADENE) 1 % cream Apply 1 application topically daily.   Yes [provider]   sucralfate (CARAFATE) 1 GM/10ML suspension Take 10 mLs (1 g total) by mouth 4 (four) times daily -  with meals and at bedtime for 12 days. 03/14/21 03/26/21 Yes 02/22/21, DO    Past Medical History: Past Medical History:  Diagnosis Date   CAD (coronary artery disease)    Cardiomyopathy (HCC)    Diabetic foot ulcer (HCC)    Dyslipidemia    Heart failure (HCC)    Myocardial infarction (HCC)    Type 2 diabetes mellitus (HCC)     Past Surgical History: Past Surgical History:  Procedure Laterality Date   APPENDECTOMY     APPLICATION OF WOUND VAC Left 03/03/2021   Procedure: APPLICATION OF WOUND VAC;  Surgeon: 02/22/21, MD;  Location: [facility] OR;  Service: Orthopedics;  Laterality: Left;   APPLICATION OF WOUND VAC  03/07/2021   Procedure: APPLICATION OF WOUND VAC;  Surgeon: [name], MD;  Location: [facility] OR;  Service: Orthopedics;;   CARDIAC CATHETERIZATION     COLON SURGERY     ESOPHAGOGASTRODUODENOSCOPY (EGD) WITH PROPOFOL N/A 03/12/2021   Procedure: ESOPHAGOGASTRODUODENOSCOPY (EGD) WITH PROPOFOL;  Surgeon: Jenel Lucks, MD;  Location: [facility] ENDOSCOPY;  Service: Gastroenterology;  Laterality: N/A;   I & D EXTREMITY Left 02/24/2021   Procedure: IRRIGATION AND DEBRIDEMENT EXTREMITY;  Surgeon: Myrene Galas, MD;  Location: [facility] OR;  Service: Orthopedics;  Laterality: Left;   I & D EXTREMITY Left 02/26/2021   Procedure: IRRIGATION AND DEBRIDEMENT OF LEG;  Surgeon: [name], MD;  Location: [facility] OR;  Service: Orthopedics;  Laterality: Left;   I & D EXTREMITY Left 03/03/2021   Procedure: LEFT LEG IRRIGATION AND DEBRIDEMENT;  Surgeon: Tarry Kos, MD;  Location: [facility] OR;  Service: Orthopedics;  Laterality: Left;   I & D EXTREMITY Left 03/07/2021   Procedure: REPEAT DEBRIDEMENT LEFT LEG;  Surgeon: [name], MD;  Location: [facility] OR;  Service: Orthopedics;  Laterality: Left;   IR FLUORO GUIDE CV LINE RIGHT  03/10/2021   IR REMOVAL TUN CV CATH W/O FL  03/24/2021   IR US GUIDE VASC ACCESS  RIGHT  03/10/2021   LEFT HEART CATH AND CORONARY ANGIOGRAPHY N/A 09/13/2020   Procedure: LEFT HEART CATH AND CORONARY ANGIOGRAPHY;  Surgeon: Lennette Bihari, MD;  Location: [facility] INVASIVE CV LAB;  Service: Cardiovascular;  Laterality: N/A;   TOE AMPUTATION  2020    Family History: Family History  Problem Relation Age of Onset   Hypertension Mother    Heart failure Father    Hypertension Brother    Congestive Heart Failure Brother     Social History: Social History   Socioeconomic History   Marital status: Single    Spouse name: Not on file   Number of children: Not on file   Years of education: Not on file   Highest education level: Not on file  Occupational History   Not on file  Tobacco Use   Smoking status: Every Day    Types: Cigarettes   Smokeless tobacco: Never   Tobacco comments:    5-6 daily  Vaping Use   Vaping Use: Never used  Substance and Sexual Activity   Alcohol use: Not Currently    Comment: Very rare   Drug use: Not Currently    Types: Marijuana   Sexual activity: Not on file  Other Topics Concern   Not on file  Social History Narrative   Not on file   Social Determinants of Health   Financial Resource Strain: High Risk   Difficulty of Paying Living Expenses: Very hard  Food Insecurity: No Food Insecurity   Worried About Programme researcher, broadcasting/film/video in the Last Year: Never true   Ran Out of Food in the Last Year: Never true  Transportation Needs: Unmet Transportation Needs   Lack of Transportation (Medical): Yes   Lack of Transportation (Non-Medical): Yes  Physical Activity: Not on file  Stress: Not on file  Social Connections: Not on file    Allergies:  Allergies  Allergen Reactions   Morphine Itching    Objective:    Vital Signs:   Temp:  [97.9 F (36.6 C)-98.7 F (37.1 C)] 98.4 F (36.9 C) (08/30 1128) Pulse Rate:  [94-107] 99 (08/30 1128) Resp:  [14-20] 18 (08/30 0842) BP: (101-122)/(68-83) 109/77 (08/30 1128) SpO2:  [93 %-96 %] 95 %  (  08/30 1128) Weight:  [123.2 kg] 123.2 kg (08/30 0638) Last BM Date: 03/24/21  Weight change: Filed Weights   03/23/21 1031 03/24/21 0543 03/25/21 0712  Weight: 122.1 kg 122.4 kg 123.2 kg    Intake/Output:   Intake/Output Summary (Last 24 hours) at 03/25/2021 1137 Last data filed at 03/25/2021 0839 Gross per 24 hour  Intake 1290.11 ml  Output 425 ml  Net 865.11 ml      Physical Exam    General:  . No resp difficulty HEENT: normal Neck: supple. JVP to jaw  Carotids 2+ bilat; no bruits. No lymphadenopathy or thyromegaly appreciated. Cor: PMI nondisplaced. Regular rate & rhythm. No rubs, gallops or murmurs. Lungs: clear Abdomen: soft, nontender, nondistended. No hepatosplenomegaly. No bruits or masses. Good bowel sounds. Extremities: no cyanosis, clubbing, rash, LLE sutures with open wound on lower portion Wound bed pink. R foot plantar wound. R and LLE 2+ edema Neuro: alert & orientedx3, cranial nerves grossly intact. moves all 4 extremities w/o difficulty. Affect pleasant   Telemetry   Sinusu Tach 100s   EKG    Sinus Tach 107 bpm   Labs   Basic Metabolic Panel: Recent Labs  Lab 03/23/21 0044 03/24/21 0540 03/24/21 1828 03/25/21 0233  NA 138 136 136 135  K 3.9 3.9 3.8 3.8  CL 106 103 103 101  CO2 23 26 25 25   GLUCOSE 145* 198* 145* 176*  BUN 22* 19 20 20   CREATININE 1.90* 2.17* 2.07* 2.06*  CALCIUM 8.4* 7.9* 8.0* 8.1*  MG  --  1.3*  --  1.9    Liver Function Tests: Recent Labs  Lab 03/24/21 0540  AST 11*  ALT 11  ALKPHOS 55  BILITOT 0.7  PROT 5.5*  ALBUMIN 1.7*   No results for input(s): LIPASE, AMYLASE in the last 168 hours. No results for input(s): AMMONIA in the last 168 hours.  CBC: Recent Labs  Lab 03/23/21 0044 03/24/21 0540 03/24/21 1828 03/25/21 0233  WBC 9.7 8.4 8.4 8.1  HGB 8.3* 6.9* 8.0* 7.9*  HCT 26.5* 21.4* 24.9* 24.5*  MCV 94.3 92.6 91.9 91.4  PLT 341 226 249 249    Cardiac Enzymes: No results for input(s): CKTOTAL,  CKMB, CKMBINDEX, TROPONINI in the last 168 hours.  BNP: BNP (last 3 results) Recent Labs    02/25/21 0149 03/23/21 0045  BNP 103.1* 1,129.9*    ProBNP (last 3 results) Recent Labs    10/07/20 1004  PROBNP 468*     CBG: Recent Labs  Lab 03/24/21 1140 03/24/21 1611 03/24/21 2138 03/25/21 0635 03/25/21 1121  GLUCAP 77 147* 160* 156* 146*    Coagulation Studies: No results for input(s): LABPROT, INR in the last 72 hours.   Imaging   IR Removal Tun Cv Cath W/O FL  Result Date: 03/24/2021 INDICATION: Patient with a tunneled central line for IV antibiotics now transitioned to oral antibiotics. Interventional radiology asked to remove tunneled central line. EXAM: REMOVAL TUNNELED CENTRAL VENOUS CATHETER MEDICATIONS: None ANESTHESIA/SEDATION: None FLUOROSCOPY TIME:  None COMPLICATIONS: None immediate. PROCEDURE: Informed written consent was obtained from the patient after a thorough discussion of the procedural risks, benefits and alternatives. All questions were addressed. Maximal Sterile Barrier Technique was utilized including caps, mask, sterile gowns, sterile gloves, sterile drape, hand hygiene and skin antiseptic. A timeout was performed prior to the initiation of the procedure. The patient's right chest and catheter was prepped and draped in a normal sterile fashion. Using gentle manual traction the cuff of the catheter was exposed  and the catheter was removed in it's entirety. Pressure was held till hemostasis was obtained. A sterile dressing was applied. The patient tolerated the procedure well with no immediate complications. IMPRESSION: Successful catheter removal as described above. Read by: Alwyn Ren, NP Electronically Signed   By: Richarda Overlie M.D.   On: 03/24/2021 15:36     Medications:     Current Medications:  amoxicillin-clavulanate  1 tablet Oral Q12H   aspirin EC  81 mg Oral Daily   atorvastatin  80 mg Oral Daily   Chlorhexidine Gluconate Cloth  6 each  Topical Daily   doxycycline  100 mg Oral Q12H   enoxaparin (LOVENOX) injection  40 mg Subcutaneous Daily   insulin aspart  0-15 Units Subcutaneous TID WC   insulin aspart  0-5 Units Subcutaneous QHS   insulin aspart  3 Units Subcutaneous TID WC   insulin glargine-yfgn  15 Units Subcutaneous QHS   metolazone  5 mg Oral Daily   metoprolol succinate  50 mg Oral Daily   pantoprazole  40 mg Oral BID AC   silver sulfADIAZINE  1 application Topical Daily   sucralfate  1 g Oral TID WC & HS    Infusions:  sodium chloride     furosemide        Patient Profile   Adam James is a 50 year old with a history CAD, Had PCI stent 2007 in [place]. 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  Admitted A/C HFrEF  Assessment/Plan  A/C HFrE ICM. Known reduced EF since February. ECHO this admit EF down to 25% from previous 40% noted on CMRI in April.  - CMRI with viable myocardium.  - Had cath 08/2020 with multivessel disease  with recommendations for CABG but delayed due to recent admit with osteo/nec fasciitis/ erosive gastritis.  - Volume overloaded today. IV lasix increased earlier today and started on metolazone.  Cut back lasix to twice  a day and assess for response. Hold off on cath until diuresed. If creatinine trends up may need to add milrinone.  - Yesterday CO-OX 55% but now tunneled PICC removed.  -Continue current dose metoprolol Xl  50 mg daily -No dig with elevated creatinine  - No spiro with CKD - No room for arni - Avoid SGLT2i with nec fasciitis.   2. CAD -Cath 08/2020 severe multivessel coronary obstructive disease with smooth 50% distal left main stenosis prior to trifurcating into the LAD, ramus intermediate, and left circumflex vessel. -CMRi 4/20222 EF 40% viable myocardium  - No chest pain.  - HS Trop 234>280.361>319>220  - Followed by Dr Cliffton Asters and  previously scheduled for CABG .  - on aspirin + statin.   3. CKD Stage IIIb -Creatinine baseline ~ 2.0-2.2 -Creatinine 2.06  - Follow BMET daily  4. LLE wound , h/o osteo/nec fasciitis --S/P multiple debridements in August.  -ID /Ortho following. On doxycyline + augmentin -LLE open portion of incision appears clear  5. R Foot wound -Per Ortho stable wounds.   6. DMII -Hgb A1C 02/21/21 8.8  -On ssi  7. Anemia  -EGD 02/2021 erosive gastritis. On protonix twice a day.  -Hgb 8.3>6.9 given 1uPRBC> 8>7.9  - Remains on aspirin.  - No obvious source of bleeding.   8. Social No payor source. Difficulty paying for medications. Set up for [facility] Transport. Referred to SW.   Will need RHC once diuresed.   Referred to [facility]  for assistance with disability. Has been by outpatient SW.    Length of Stay: 1  Amy Clegg, NP  03/25/2021, 11:37 AM  Advanced Heart Failure Team Pager [phone] (M-F; 7a - 5p)  Please contact [facility] for night-coverage after hours (4p -7a ) and weekends on [email or web address]  Patient seen with NP, agree with the above note.    Recent history as noted above.  He was planned for CABG in 8/22 but this was canceled due to admission with left foot diabetic infection progressing to necrotizing fasciitis.    He has been readmitted with progressive lower extremity edema and dyspnea.  No chest pain.  Creatinine 2.06 (stable).  Co-ox was done off tunneled catheter yesterday before removal, was relatively low at 55%.   He has been seen by Dr. Lajoyce Corners, the left leg surgical incision appears to be healing well.  He has a venous stasis ulcer on the right lower leg.   General: NAD Neck: JVP 14 cm, no thyromegaly or thyroid nodule.  Lungs: Clear to auscultation bilaterally with normal respiratory effort. CV: Nondisplaced PMI.  Heart mildly tachy, regular S1/S2, no S3/S4, no murmur.  1+ edema to knee on right with venous stasis ulceration right foot.  Left lower leg with  surgical incision, healing.   No carotid bruit.  Unable to palpate pedal pulses.  Abdomen: Soft, nontender, no hepatosplenomegaly, no distention.  Skin: Intact without lesions or rashes.  Neurologic: Alert and oriented x 3.  Psych: Normal affect. Extremities: No clubbing or cyanosis.  HEENT: Normal.   1. CAD: LHC in 2/22 with 50% dLM, 70% ramus, 70% pLCx, totally occluded proximal RCA, 90% stenosis ostial to mid LAD, 90% mid LAD, totally occluded distal LAD.  Cardiac MRI in 4/22 showed EF 40% with substantial viable myocardium, only delayed enhancement was subendocardial in the mid to apical inferior wall.  With this information, CABG had been planned in 8/22, but patient was admitted prior with left foot diabetic foot infection that progressed to necrotizing fasciitis.  Patient is now s/p extensive left lower leg debridement.  He has completed IV abx and is on po abx at this point.  Ideally, would have CABG.  However, need to have further healing of the left lower leg.  Also, will not be able to use veins from the left leg, and with ulceration on the right lower leg, I am not sure venous harvest from the right leg would be feasible (would likely need all arterial conduits). Finally, I am concerned for low output HF with sinus tachycardia and low co-ox yesterday.   - Continue ASA 81 daily.  - Continue atorvastatin 80 mg daily.  - As above, would ideally have CABG, but needs substantial "tuning" before he is a candidate.  2. Acute on chronic systolic CHF: With concern for low output HF.  Ischemic cardiomyopathy.  Initial echo in 2/22 with EF 40-45%.  Echo in 7/22 with EF 30-35%.  Echo in 8/22 with EF down to 25% with mild RV dysfunction and dilated IVC.  He was admitted this time with CHF exacerbation.  He is volume overloaded on exam.  Creatinine is 2.06, which appears to be around his baseline currently.  On exam, he is volume overloaded.  Co-ox off tunneled catheter (which was removed) was 54.8%  yesterday, suggesting at least borderline low output.  Medication titration will be limited by CKD.   - Today, he will get metolazone 2.5 x 1 + Lasix 100 mg IV bid.  Will follow  response and adjust.  - We will plan on RHC tomorrow to assess filling pressures and cardiac output, may leave Swan in place.  - Can continue Toprol XL 50 mg daily for now, if CO low on RHC tomorrow will cut back.  - For now, no Entresto or spironolactone with elevated creatinine.  3. CKD: Stage 3.  Creatinine is 2.06 today which may be around his baseline.  Will follow closely.  4. GI bleeding: History of erosive esophagitis earlier in 8/22.  He is now on Protonix.  Hgb 7.9 after transfusion this admission.  - Follow hgb closely.  5. Left leg diabetic foot infection => necrotizing fasciitis: He has completed IV abx and is s/p debridement.  Now on doxycycline/Augmentin, will need to complete course.  6. RLE ulceration: Suspect venous stasis.  Stable.  7. DM2: SSE.   Marca AnconaDalton Tazia Illescas 03/25/2021 5:01 PM

## 2021-03-25 NOTE — Progress Notes (Signed)
Orthopedic Tech Progress Note Patient Details:  Horst Ostermiller May 31, 1971 [id number] Also called in order to [employer] for a XXL COMPRESSION STOCKING (RLE) and a XL COMPRESSION STOCKING (LLE)   Ortho Devices Type of Ortho Device: Darco shoe Ortho Device/Splint Location: RLE Ortho Device/Splint Interventions: Ordered, Application   Post Interventions Patient Tolerated: Well Instructions Provided: Care of device  [name] 03/25/2021, 8:46 AM

## 2021-03-25 NOTE — Progress Notes (Signed)
    [facility]   Reason for visit: Follow up on osteomyelitis  Interval History: seen by Dr. Lajoyce Corners and felt most c/w lymphatic drainage as expected.   No fever, WBC wnl.    Physical Exam: Constitutional:  Vitals:   03/25/21 0633 03/25/21 0842  BP: 113/83 109/78  Pulse: (!) 101 100  Resp: 16 18  Temp: 98.5 F (36.9 C) 98.3 F (36.8 C)  SpO2: 94% 96%   patient appears in NAD Respiratory: Normal respiratory effort; CTA B Cardiovascular: RRR Left leg with no erythema, no new concerns.  Review of Systems: Constitutional: negative for fevers and chills Gastrointestinal: negative for diarrhea  Lab Results  Component Value Date   WBC 8.1 03/25/2021   HGB 7.9 (L) 03/25/2021   HCT 24.5 (L) 03/25/2021   MCV 91.4 03/25/2021   PLT 249 03/25/2021    Lab Results  Component Value Date   CREATININE 2.06 (H) 03/25/2021   BUN 20 03/25/2021   NA 135 03/25/2021   K 3.8 03/25/2021   CL 101 03/25/2021   CO2 25 03/25/2021    Lab Results  Component Value Date   ALT 11 03/24/2021   AST 11 (L) 03/24/2021   ALKPHOS 55 03/24/2021     Microbiology: No results found for this or any previous visit (from the past 240 hour(s)).  Impression/Plan:  1. Osteomyelitis - on doxycycline and augmentin and will continue.  Felt to be healing well and appreciate Dr. Lajoyce Corners relooking at his leg.  I agree, not c/w pus/purulence.  Will use a medicate stocking now.    2.  Heart failure - worsening and to be seen by the heart failure team.   I will sign off, call with questions.

## 2021-03-25 NOTE — Consult Note (Signed)
ORTHOPAEDIC CONSULTATION  REQUESTING PHYSICIAN: Inez Catalina, MD  Chief Complaint: Congestive heart failure with swelling and ulceration right lower extremity with persistent drainage from left leg 3 weeks status post final debridement.  HPI: Adam James is a 50 y.o. male who presents with congestive heart failure with increased swelling and ulceration to the right lower extremity.  Patient states he also has some drainage from the left leg status post limb salvage intervention for necrotizing fasciitis.  Past Medical History:  Diagnosis Date   CAD (coronary artery disease)    Cardiomyopathy (HCC)    Diabetic foot ulcer (HCC)    Dyslipidemia    Heart failure (HCC)    Myocardial infarction (HCC)    Type 2 diabetes mellitus (HCC)    Past Surgical History:  Procedure Laterality Date   APPENDECTOMY     APPLICATION OF WOUND VAC Left 03/03/2021   Procedure: APPLICATION OF WOUND VAC;  Surgeon: Tarry Kos, MD;  Location: [facility] OR;  Service: Orthopedics;  Laterality: Left;   APPLICATION OF WOUND VAC  03/07/2021   Procedure: APPLICATION OF WOUND VAC;  Surgeon: [name], MD;  Location: [facility] OR;  Service: Orthopedics;;   CARDIAC CATHETERIZATION     COLON SURGERY     ESOPHAGOGASTRODUODENOSCOPY (EGD) WITH PROPOFOL N/A 03/12/2021   Procedure: ESOPHAGOGASTRODUODENOSCOPY (EGD) WITH PROPOFOL;  Surgeon: Jenel Lucks, MD;  Location: [facility] ENDOSCOPY;  Service: Gastroenterology;  Laterality: N/A;   I & D EXTREMITY Left 02/24/2021   Procedure: IRRIGATION AND DEBRIDEMENT EXTREMITY;  Surgeon: Myrene Galas, MD;  Location: [facility] OR;  Service: Orthopedics;  Laterality: Left;   I & D EXTREMITY Left 02/26/2021   Procedure: IRRIGATION AND DEBRIDEMENT OF LEG;  Surgeon: [name], MD;  Location: [facility] OR;  Service: Orthopedics;  Laterality: Left;   I & D EXTREMITY Left 03/03/2021   Procedure: LEFT LEG IRRIGATION AND DEBRIDEMENT;  Surgeon: Tarry Kos, MD;  Location: [facility] OR;  Service: Orthopedics;   Laterality: Left;   I & D EXTREMITY Left 03/07/2021   Procedure: REPEAT DEBRIDEMENT LEFT LEG;  Surgeon: [name], MD;  Location: [facility] OR;  Service: Orthopedics;  Laterality: Left;   IR FLUORO GUIDE CV LINE RIGHT  03/10/2021   IR REMOVAL TUN CV CATH W/O FL  03/24/2021   IR US GUIDE VASC ACCESS RIGHT  03/10/2021   LEFT HEART CATH AND CORONARY ANGIOGRAPHY N/A 09/13/2020   Procedure: LEFT HEART CATH AND CORONARY ANGIOGRAPHY;  Surgeon: Lennette Bihari, MD;  Location: [facility] INVASIVE CV LAB;  Service: Cardiovascular;  Laterality: N/A;   TOE AMPUTATION  2020   Social History   Socioeconomic History   Marital status: Single    Spouse name: Not on file   Number of children: Not on file   Years of education: Not on file   Highest education level: Not on file  Occupational History   Not on file  Tobacco Use   Smoking status: Every Day    Types: Cigarettes   Smokeless tobacco: Never   Tobacco comments:    5-6 daily  Vaping Use   Vaping Use: Never used  Substance and Sexual Activity   Alcohol use: Not Currently    Comment: Very rare   Drug use: Not Currently    Types: Marijuana   Sexual activity: Not on file  Other Topics Concern   Not on file  Social History Narrative   Not on file   Social Determinants of Health   Financial Resource  Strain: High Risk   Difficulty of Paying Living Expenses: Very hard  Food Insecurity: No Food Insecurity   Worried About Programme researcher, broadcasting/film/video in the Last Year: Never true   Barista in the Last Year: Never true  Transportation Needs: Personal assistant (Medical): Yes   Lack of Transportation (Non-Medical): Yes  Physical Activity: Not on file  Stress: Not on file  Social Connections: Not on file   Family History  Problem Relation Age of Onset   Hypertension Mother    Heart failure Father    Hypertension Brother    Congestive Heart Failure Brother    - negative except otherwise stated in the family history  section Allergies  Allergen Reactions   Morphine Itching   Prior to Admission medications   Medication Sig Start Date End Date Taking? Authorizing Provider  amLODipine (NORVASC) 5 MG tablet Take 1 tablet (5 mg total) by mouth daily. 02/20/21  Yes Baldo Daub, MD  amoxicillin-clavulanate (AUGMENTIN) 875-125 MG tablet Take 1 tablet by mouth 2 (two) times daily. 03/14/21 04/18/21 Yes Atway, Rayann N, DO  aspirin EC 81 MG tablet Take 1 tablet (81 mg total) by mouth daily. Swallow whole. 02/20/21  Yes Baldo Daub, MD  atorvastatin (LIPITOR) 80 MG tablet Take 1 tablet (80 mg total) by mouth daily. 02/20/21  Yes Baldo Daub, MD  doxycycline (VIBRA-TABS) 100 MG tablet Take 1 tablet (100 mg total) by mouth 2 (two) times daily. 03/14/21 04/18/21 Yes Atway, Rayann N, DO  furosemide (LASIX) 40 MG tablet Take 1 tablet (40 mg total) by mouth daily. Take 40 mg every other day alternating with 2 tablets (80mg ) every other day Patient taking differently: Take 40 mg by mouth as directed. Take 40 mg every other day alternating with 2 tablets (80mg ) every other day 03/19/21  Yes , MD  glyBURIDE (DIABETA) 5 MG tablet Take 5 mg by mouth 2 (two) times daily. 10/12/20  Yes [provider]  irbesartan (AVAPRO) 75 MG tablet Take 1 tablet (75 mg total) by mouth daily. 01/20/21  Yes 10/14/20, MD  metFORMIN (GLUCOPHAGE) 500 MG tablet Take 500 mg by mouth 2 (two) times daily. 10/12/20  Yes [provider]  metoprolol succinate (TOPROL-XL) 50 MG 24 hr tablet Take 1 tablet (50 mg total) by mouth daily. Take with or immediately following a meal. 03/15/21 04/14/21 Yes Atway, Rayann N, DO  pantoprazole (PROTONIX) 40 MG tablet Take 1 tablet (40 mg total) by mouth 2 (two) times daily before a meal. 03/14/21 04/13/21 Yes Atway, Rayann N, DO  silver sulfADIAZINE (SILVADENE) 1 % cream Apply 1 application topically daily.   Yes [provider]  sucralfate (CARAFATE) 1 GM/10ML suspension Take  10 mLs (1 g total) by mouth 4 (four) times daily -  with meals and at bedtime for 12 days. 03/14/21 03/26/21 Yes Atway, Rayann N, DO   IR Removal Tun Cv Cath W/O FL  Result Date: 03/24/2021 INDICATION: Patient with a tunneled central line for IV antibiotics now transitioned to oral antibiotics. Interventional radiology asked to remove tunneled central line. EXAM: REMOVAL TUNNELED CENTRAL VENOUS CATHETER MEDICATIONS: None ANESTHESIA/SEDATION: None FLUOROSCOPY TIME:  None COMPLICATIONS: None immediate. PROCEDURE: Informed written consent was obtained from the patient after a thorough discussion of the procedural risks, benefits and alternatives. All questions were addressed. Maximal Sterile Barrier Technique was utilized including caps, mask, sterile gowns, sterile gloves, sterile drape, hand hygiene and  skin antiseptic. A timeout was performed prior to the initiation of the procedure. The patient's right chest and catheter was prepped and draped in a normal sterile fashion. Using gentle manual traction the cuff of the catheter was exposed and the catheter was removed in it's entirety. Pressure was held till hemostasis was obtained. A sterile dressing was applied. The patient tolerated the procedure well with no immediate complications. IMPRESSION: Successful catheter removal as described above. Read by: Alwyn RenJamie Covington, NP Electronically Signed   By: Richarda OverlieAdam  Henn M.D.   On: 03/24/2021 15:36   ECHOCARDIOGRAM COMPLETE  Result Date: 03/24/2021    ECHOCARDIOGRAM REPORT   Patient Name:   Samella ParrMICHAEL Moronta Date of Exam: 03/24/2021 Medical Rec #:  [id number]        Height:       75.0 in Accession #:    [id number]       Weight:       269.8 lb Date of Birth:  05/22/1971        BSA:          2.492 m Patient Age:    49 years         BP:           107/66 mmHg Patient Gender: M                HR:           80 bpm. Exam Location:  Inpatient Procedure: 2D Echo, Color Doppler, Cardiac Doppler and 3D Echo Indications:    I50.9*  Heart failure (unspecified)  History:        Patient has prior history of Echocardiogram examinations, most                 recent 02/20/2021. CAD; Risk Factors:Diabetes and Dyslipidemia.  Sonographer:    Irving BurtonEmily Senior RDCS Referring Phys: [id number] EMILY B MULLEN IMPRESSIONS  1. Left ventricular ejection fraction, by estimation, is 25%. The left ventricle has severely decreased function. The left ventricle demonstrates global hypokinesis. The left ventricular internal cavity size was mildly dilated. Left ventricular diastolic parameters are consistent with Grade II diastolic dysfunction (pseudonormalization).  2. Right ventricular systolic function is mildly reduced. The right ventricular size is normal. The estimated right ventricular systolic pressure is 42.7 mmHg.  3. Left atrial size was moderately dilated.  4. The mitral valve is normal in structure. Mild mitral valve regurgitation. No evidence of mitral stenosis.  5. The aortic valve is tricuspid. Aortic valve regurgitation is mild. No aortic stenosis is present.  6. The inferior vena cava is dilated in size with <50% respiratory variability, suggesting right atrial pressure of 15 mmHg.  7. There is a left-sided pleural effusion. FINDINGS  Left Ventricle: Left ventricular ejection fraction, by estimation, is 25%. The left ventricle has severely decreased function. The left ventricle demonstrates global hypokinesis. The left ventricular internal cavity size was mildly dilated. There is no left ventricular hypertrophy. Left ventricular diastolic parameters are consistent with Grade II diastolic dysfunction (pseudonormalization). Right Ventricle: The right ventricular size is normal. No increase in right ventricular wall thickness. Right ventricular systolic function is mildly reduced. The tricuspid regurgitant velocity is 2.63 m/s, and with an assumed right atrial pressure of 15  mmHg, the estimated right ventricular systolic pressure is 42.7 mmHg. Left Atrium: Left  atrial size was moderately dilated. Right Atrium: Right atrial size was normal in size. Pericardium: There is a left-sided pleural effusion. Trivial pericardial effusion is present. Mitral Valve: The mitral valve is  normal in structure. Mild mitral valve regurgitation. No evidence of mitral valve stenosis. Tricuspid Valve: The tricuspid valve is normal in structure. Tricuspid valve regurgitation is trivial. Aortic Valve: The aortic valve is tricuspid. Aortic valve regurgitation is mild. No aortic stenosis is present. Pulmonic Valve: The pulmonic valve was normal in structure. Pulmonic valve regurgitation is not visualized. Aorta: The aortic root is normal in size and structure. Venous: The inferior vena cava is dilated in size with less than 50% respiratory variability, suggesting right atrial pressure of 15 mmHg. IAS/Shunts: No atrial level shunt detected by color flow Doppler.  LEFT VENTRICLE PLAX 2D LVIDd:         6.30 cm LVIDs:         5.50 cm LV PW:         0.90 cm LV IVS:        0.90 cm LVOT diam:     2.40 cm  3D Volume EF: LV SV:         57       3D EF:        28 % LV SV Index:   23       LV EDV:       354 ml LVOT Area:     4.52 cm LV ESV:       254 ml                         LV SV:        99 ml RIGHT VENTRICLE RV S prime:     9.03 cm/s TAPSE (M-mode): 1.8 cm LEFT ATRIUM            Index       RIGHT ATRIUM           Index LA diam:      4.80 cm  1.93 cm/m  RA Area:     16.00 cm LA Vol (A2C): 72.3 ml  29.01 ml/m RA Volume:   39.80 ml  15.97 ml/m LA Vol (A4C): 101.0 ml 40.53 ml/m  AORTIC VALVE LVOT Vmax:   77.70 cm/s LVOT Vmean:  58.800 cm/s LVOT VTI:    0.125 m  AORTA Ao Root diam: 3.40 cm Ao Asc diam:  3.40 cm TRICUSPID VALVE TR Peak grad:   27.7 mmHg TR Vmax:        263.00 cm/s  SHUNTS Systemic VTI:  0.12 m Systemic Diam: 2.40 cm Dalton McleanMD Electronically signed by Wilfred Lacy Signature Date/Time: 03/24/2021/2:48:47 PM    Final    - pertinent xrays, CT, MRI studies were reviewed and  independently interpreted  Positive ROS: All other systems have been reviewed and were otherwise negative with the exception of those mentioned in the HPI and as above.  Physical Exam: General: Alert, no acute distress Psychiatric: Patient is competent for consent with normal mood and affect Lymphatic: No axillary or cervical lymphadenopathy Cardiovascular: No pedal edema Respiratory: No cyanosis, no use of accessory musculature GI: No organomegaly, abdomen is soft and non-tender    Images:  @  Labs:  Lab Results  Component Value Date   HGBA1C 8.8 (H) 02/21/2021   ESRSEDRATE 25 (H) 03/14/2021   ESRSEDRATE >140 (H) 02/25/2021   CRP 1.6 (H) 03/14/2021   REPTSTATUS 03/05/2021 FINAL 02/26/2021   GRAMSTAIN  02/26/2021    FEW WBC PRESENT, PREDOMINANTLY PMN NO ORGANISMS SEEN    CULT  02/26/2021    RARE STREPTOCOCCUS AGALACTIAE TESTING AGAINST S.  AGALACTIAE NOT ROUTINELY PERFORMED DUE TO PREDICTABILITY OF AMP/PEN/VAN SUSCEPTIBILITY. CRITICAL RESULT CALLED TO, READ BACK BY AND VERIFIED WITH: RN C.WELLS AT 1241 ON 02/27/2021 BY T.SAAD. RARE FINEGOLDIA MAGNA RARE CLOSTRIDIUM CLOSTRIDIOFORME CRITICAL RESULT CALLED TO, READ BACK BY AND VERIFIED WITH: RN Aldona Lento [phone] FCP Performed at [facility], [address], [place], [place] [address]    LABORGA METHICILLIN RESISTANT STAPHYLOCOCCUS AUREUS 02/24/2021    Lab Results  Component Value Date   ALBUMIN 1.7 (L) 03/24/2021   ALBUMIN 1.5 (L) 03/15/2021   ALBUMIN 1.9 (L) 02/24/2021     CBC EXTENDED Latest Ref Rng & Units 03/25/2021 03/24/2021 03/24/2021  WBC 4.0 - 10.5 K/uL 8.1 8.4 8.4  RBC 4.22 - 5.81 MIL/uL 2.68(L) 2.71(L) 2.31(L)  HGB 13.0 - 17.0 g/dL 7.9(L) 8.0(L) 6.9(LL)  HCT 39.0 - 52.0 % 24.5(L) 24.9(L) 21.4(L)  PLT 150 - 400 K/uL 249 249 226  NEUTROABS 1.7 - 7.7 K/uL - - -  LYMPHSABS 0.7 - 4.0 K/uL - - -    Neurologic: Patient does not have protective sensation bilateral lower  extremities.   MUSCULOSKELETAL:   Skin: Examination of the right leg patient has a strong dorsalis pedis pulse the calf is soft nontender no evidence on the DVT clinically no pain with dorsiflexion of the ankle.  Patient has significant pitting edema of the right lower extremity with venous ulcers on the dorsum of the foot as well as anteriorly over the tibia.  There is no cellulitis no signs of infection.  Examination of the left lower extremity the surgical incision is well approximated there is healthy granulation tissue over the ulcers of the foot dorsally and plantarly.  There is drainage.  There is drainage most likely is secondary to lymphatic insufficiency status post resection of the entire anterior compartment both of the muscles fascia and lymphatics.  Patient's white blood cell count is stable at 8.1 hemoglobin 7.9 with an albumin of 1.7.  Review of his cardiac study shows an ejection fraction of 25%.  Patient's left leg has no redness no tenderness to palpation no odor with the drainage.  Patient has foot drop on the left.  Assessment: Assessment: Heart failure with ejection fraction of 25% with venous insufficiency swelling and ulceration of the right lower extremity and progressive healing of the limb salvage intervention of the left leg with lymphatic drainage.  Plan: Will have a Vive wear double extra-large compression sock applied to the right lower extremity this should be applied directly against the skin with no dressing between the wounds and the sock.  Patient is to wear this sock during waking hours and change the sock for bathing.  We will apply a extra-large sock to the left lower extremity this should also be worn directly against the skin with no dressing between the wound and the sock.  Change the sock as needed due to drainage.  We will also get patient a postoperative shoe for the right foot and continue with the fracture boot on the left.  Thank you for the consult  and the opportunity to see Mr. Win Guajardo, MD [facility] [phone] 8:08 AM

## 2021-03-25 NOTE — Progress Notes (Signed)
Progress Note  Patient Name: Adam James Date of Encounter: 03/25/2021  Primary Cardiologist: Norman Herrlich, MD   Subjective   Patient feels great.  Still unable to lay flat  Inpatient Medications    Scheduled Meds:  amoxicillin-clavulanate  1 tablet Oral Q12H   aspirin EC  81 mg Oral Daily   atorvastatin  80 mg Oral Daily   Chlorhexidine Gluconate Cloth  6 each Topical Daily   doxycycline  100 mg Oral Q12H   enoxaparin (LOVENOX) injection  40 mg Subcutaneous Daily   furosemide  80 mg Intravenous TID   insulin aspart  0-15 Units Subcutaneous TID WC   insulin aspart  0-5 Units Subcutaneous QHS   insulin aspart  3 Units Subcutaneous TID WC   insulin glargine-yfgn  15 Units Subcutaneous QHS   metoprolol succinate  50 mg Oral Daily   pantoprazole  40 mg Oral BID AC   silver sulfADIAZINE  1 application Topical Daily   sucralfate  1 g Oral TID WC & HS   Continuous Infusions:  sodium chloride     PRN Meds: sodium chloride, sodium chloride flush   Vital Signs    Vitals:   03/25/21 0053 03/25/21 0633 03/25/21 0638 03/25/21 0842  BP: 112/72 113/83  109/78  Pulse: (!) 107 (!) 101  100  Resp: 17 16  18   Temp: 98.6 F (37 C) 98.5 F (36.9 C)  98.3 F (36.8 C)  TempSrc: Oral Oral  Oral  SpO2: 94% 94%  96%  Weight:   123.2 kg   Height:        Intake/Output Summary (Last 24 hours) at 03/25/2021 1055 Last data filed at 03/25/2021 0839 Gross per 24 hour  Intake 1290.11 ml  Output 425 ml  Net 865.11 ml   Filed Weights   03/23/21 1031 03/24/21 0543 03/25/21 03/27/21  Weight: 122.1 kg 122.4 kg 123.2 kg    Telemetry    One 6 beat run of NSVT - Personally Reviewed  ECG    Sinus tachycardia with PVCs,  - Personally Reviewed  Physical Exam   GEN: No acute distress.   Neck: JVD to mandible Cardiac: regular tachycardia no murmurs no rubs, or gallops but distant heart sounds Respiratory: Crackles bilaterally GI: Soft, nontender, non-distended  MS: Non-pitting  edema warm, sutures in left leg are c/d/i Neuro:  Nonfocal  Psych: Normal affect   Labs    Chemistry Recent Labs  Lab 03/24/21 0540 03/24/21 1828 03/25/21 0233  NA 136 136 135  K 3.9 3.8 3.8  CL 103 103 101  CO2 26 25 25   GLUCOSE 198* 145* 176*  BUN 19 20 20   CREATININE 2.17* 2.07* 2.06*  CALCIUM 7.9* 8.0* 8.1*  PROT 5.5*  --   --   ALBUMIN 1.7*  --   --   AST 11*  --   --   ALT 11  --   --   ALKPHOS 55  --   --   BILITOT 0.7  --   --   GFRNONAA 37* 39* 39*  ANIONGAP 7 8 9      Hematology Recent Labs  Lab 03/24/21 0540 03/24/21 1828 03/25/21 0233  WBC 8.4 8.4 8.1  RBC 2.31* 2.71* 2.68*  HGB 6.9* 8.0* 7.9*  HCT 21.4* 24.9* 24.5*  MCV 92.6 91.9 91.4  MCH 29.9 29.5 29.5  MCHC 32.2 32.1 32.2  RDW 15.6* 15.4 15.6*  PLT 226 249 249    Cardiac EnzymesNo results for input(s): TROPONINI in the  last 168 hours. No results for input(s): TROPIPOC in the last 168 hours.   BNP Recent Labs  Lab 03/23/21 0045  BNP 1,129.9*     DDimer No results for input(s): DDIMER in the last 168 hours.   Radiology    IR Removal Tun Cv Cath W/O FL  Result Date: 03/24/2021 INDICATION: Patient with a tunneled central line for IV antibiotics now transitioned to oral antibiotics. Interventional radiology asked to remove tunneled central line. EXAM: REMOVAL TUNNELED CENTRAL VENOUS CATHETER MEDICATIONS: None ANESTHESIA/SEDATION: None FLUOROSCOPY TIME:  None COMPLICATIONS: None immediate. PROCEDURE: Informed written consent was obtained from the patient after a thorough discussion of the procedural risks, benefits and alternatives. All questions were addressed. Maximal Sterile Barrier Technique was utilized including caps, mask, sterile gowns, sterile gloves, sterile drape, hand hygiene and skin antiseptic. A timeout was performed prior to the initiation of the procedure. The patient's right chest and catheter was prepped and draped in a normal sterile fashion. Using gentle manual traction the  cuff of the catheter was exposed and the catheter was removed in it's entirety. Pressure was held till hemostasis was obtained. A sterile dressing was applied. The patient tolerated the procedure well with no immediate complications. IMPRESSION: Successful catheter removal as described above. Read by: Alwyn RenJamie Covington, NP Electronically Signed   By: Richarda OverlieAdam  Henn M.D.   On: 03/24/2021 15:36   ECHOCARDIOGRAM COMPLETE  Result Date: 03/24/2021    ECHOCARDIOGRAM REPORT   Patient Name:   Adam James Date of Exam: 03/24/2021 Medical Rec #:  [id number]        Height:       75.0 in Accession #:    [id number]       Weight:       269.8 lb Date of Birth:  11/21/1970        BSA:          2.492 m Patient Age:    49 years         BP:           107/66 mmHg Patient Gender: M                HR:           80 bpm. Exam Location:  Inpatient Procedure: 2D Echo, Color Doppler, Cardiac Doppler and 3D Echo Indications:    I50.9* Heart failure (unspecified)  History:        Patient has prior history of Echocardiogram examinations, most                 recent 02/20/2021. CAD; Risk Factors:Diabetes and Dyslipidemia.  Sonographer:    Irving BurtonEmily Senior RDCS Referring Phys: [id number] EMILY B MULLEN IMPRESSIONS  1. Left ventricular ejection fraction, by estimation, is 25%. The left ventricle has severely decreased function. The left ventricle demonstrates global hypokinesis. The left ventricular internal cavity size was mildly dilated. Left ventricular diastolic parameters are consistent with Grade II diastolic dysfunction (pseudonormalization).  2. Right ventricular systolic function is mildly reduced. The right ventricular size is normal. The estimated right ventricular systolic pressure is 42.7 mmHg.  3. Left atrial size was moderately dilated.  4. The mitral valve is normal in structure. Mild mitral valve regurgitation. No evidence of mitral stenosis.  5. The aortic valve is tricuspid. Aortic valve regurgitation is mild. No aortic stenosis is  present.  6. The inferior vena cava is dilated in size with <50% respiratory variability, suggesting right atrial pressure of 15 mmHg.  7. There is a  left-sided pleural effusion. FINDINGS  Left Ventricle: Left ventricular ejection fraction, by estimation, is 25%. The left ventricle has severely decreased function. The left ventricle demonstrates global hypokinesis. The left ventricular internal cavity size was mildly dilated. There is no left ventricular hypertrophy. Left ventricular diastolic parameters are consistent with Grade II diastolic dysfunction (pseudonormalization). Right Ventricle: The right ventricular size is normal. No increase in right ventricular wall thickness. Right ventricular systolic function is mildly reduced. The tricuspid regurgitant velocity is 2.63 m/s, and with an assumed right atrial pressure of 15  mmHg, the estimated right ventricular systolic pressure is 42.7 mmHg. Left Atrium: Left atrial size was moderately dilated. Right Atrium: Right atrial size was normal in size. Pericardium: There is a left-sided pleural effusion. Trivial pericardial effusion is present. Mitral Valve: The mitral valve is normal in structure. Mild mitral valve regurgitation. No evidence of mitral valve stenosis. Tricuspid Valve: The tricuspid valve is normal in structure. Tricuspid valve regurgitation is trivial. Aortic Valve: The aortic valve is tricuspid. Aortic valve regurgitation is mild. No aortic stenosis is present. Pulmonic Valve: The pulmonic valve was normal in structure. Pulmonic valve regurgitation is not visualized. Aorta: The aortic root is normal in size and structure. Venous: The inferior vena cava is dilated in size with less than 50% respiratory variability, suggesting right atrial pressure of 15 mmHg. IAS/Shunts: No atrial level shunt detected by color flow Doppler.  LEFT VENTRICLE PLAX 2D LVIDd:         6.30 cm LVIDs:         5.50 cm LV PW:         0.90 cm LV IVS:        0.90 cm LVOT diam:      2.40 cm  3D Volume EF: LV SV:         57       3D EF:        28 % LV SV Index:   23       LV EDV:       354 ml LVOT Area:     4.52 cm LV ESV:       254 ml                         LV SV:        99 ml RIGHT VENTRICLE RV S prime:     9.03 cm/s TAPSE (M-mode): 1.8 cm LEFT ATRIUM            Index       RIGHT ATRIUM           Index LA diam:      4.80 cm  1.93 cm/m  RA Area:     16.00 cm LA Vol (A2C): 72.3 ml  29.01 ml/m RA Volume:   39.80 ml  15.97 ml/m LA Vol (A4C): 101.0 ml 40.53 ml/m  AORTIC VALVE LVOT Vmax:   77.70 cm/s LVOT Vmean:  58.800 cm/s LVOT VTI:    0.125 m  AORTA Ao Root diam: 3.40 cm Ao Asc diam:  3.40 cm TRICUSPID VALVE TR Peak grad:   27.7 mmHg TR Vmax:        263.00 cm/s  SHUNTS Systemic VTI:  0.12 m Systemic Diam: 2.40 cm Dalton McleanMD Electronically signed by Wilfred Lacy Signature Date/Time: 03/24/2021/2:48:47 PM    Final       Patient Profile     50 y.o. 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  Deferred from [facility] because he did not proceed to CMR for viability study AKI on CKD stage IIIA Normocyctic anemia s/p transfusion ( hx of GI bleed; this has delay consideration of surgery) RLE ulcer with debridement Financial barriers to care - will start high dose diuresis with 100 IV TID lasix - hold BB, only one run of NSVT - adding metoloazone - given Venous co-ox of 54; may need inotropic support; AHF team to see; discussed potential RHC with patient - ASA and atorvastatin - financial barriers to care my push inpatient therapy (CMR for viability     For questions or updates, please contact [facility] Please consult [email or web address] for contact info under Cardiology/STEMI.      Signed, Christell Constant, MD  03/25/2021, 10:55 AM

## 2021-03-26 ENCOUNTER — Encounter (HOSPITAL_COMMUNITY): Payer: Self-pay | Admitting: Cardiology

## 2021-03-26 ENCOUNTER — Encounter (HOSPITAL_COMMUNITY)
Admission: EM | Disposition: A | Discharge status: Home / Self Care | Payer: Self-pay | Source: Home / Self Care | Attending: Internal Medicine

## 2021-03-26 DIAGNOSIS — N183 Chronic kidney disease, stage 3 unspecified: Secondary | ICD-10-CM

## 2021-03-26 HISTORY — PX: RIGHT HEART CATH: CATH118263

## 2021-03-26 LAB — POCT I-STAT EG7
Acid-Base Excess: 5 mmol/L — ABNORMAL HIGH (ref 0.0–2.0)
Acid-Base Excess: 5 mmol/L — ABNORMAL HIGH (ref 0.0–2.0)
Bicarbonate: 29.8 mmol/L — ABNORMAL HIGH (ref 20.0–28.0)
Bicarbonate: 29.2 mmol/L — ABNORMAL HIGH (ref 20.0–28.0)
Calcium, Ion: 1.18 mmol/L (ref 1.15–1.40)
Calcium, Ion: 1.18 mmol/L (ref 1.15–1.40)
HCT: 24 % — ABNORMAL LOW (ref 39.0–52.0)
HCT: 24 % — ABNORMAL LOW (ref 39.0–52.0)
Hemoglobin: 8.2 g/dL — ABNORMAL LOW (ref 13.0–17.0)
Hemoglobin: 8.2 g/dL — ABNORMAL LOW (ref 13.0–17.0)
O2 Saturation: 61 %
O2 Saturation: 61 %
Potassium: 3.7 mmol/L (ref 3.5–5.1)
Potassium: 3.6 mmol/L (ref 3.5–5.1)
Sodium: 137 mmol/L (ref 135–145)
Sodium: 138 mmol/L (ref 135–145)
TCO2: 31 mmol/L (ref 22–32)
TCO2: 30 mmol/L (ref 22–32)
pCO2, Ven: 42.2 mmHg — ABNORMAL LOW (ref 44.0–60.0)
pCO2, Ven: 41.1 mmHg — ABNORMAL LOW (ref 44.0–60.0)
pH, Ven: 7.457 — ABNORMAL HIGH (ref 7.250–7.430)
pH, Ven: 7.46 — ABNORMAL HIGH (ref 7.250–7.430)
pO2, Ven: 30 mmHg — CL (ref 32.0–45.0)
pO2, Ven: 30 mmHg — CL (ref 32.0–45.0)

## 2021-03-26 LAB — BASIC METABOLIC PANEL
Anion gap: 7 (ref 5–15)
BUN: 19 mg/dL (ref 6–20)
CO2: 27 mmol/L (ref 22–32)
Calcium: 7.9 mg/dL — ABNORMAL LOW (ref 8.9–10.3)
Chloride: 100 mmol/L (ref 98–111)
Creatinine, Ser: 2.05 mg/dL — ABNORMAL HIGH (ref 0.61–1.24)
GFR, Estimated: 39 mL/min — ABNORMAL LOW (ref >60)
Glucose, Bld: 190 mg/dL — ABNORMAL HIGH (ref 70–99)
Potassium: 3.2 mmol/L — ABNORMAL LOW (ref 3.5–5.1)
Sodium: 134 mmol/L — ABNORMAL LOW (ref 135–145)

## 2021-03-26 LAB — CBC
HCT: 23.4 % — ABNORMAL LOW (ref 39.0–52.0)
Hemoglobin: 7.7 g/dL — ABNORMAL LOW (ref 13.0–17.0)
MCH: 30 pg (ref 26.0–34.0)
MCHC: 32.9 g/dL (ref 30.0–36.0)
MCV: 91.1 fL (ref 80.0–100.0)
Platelets: 241 10*3/uL (ref 150–400)
RBC: 2.57 MIL/uL — ABNORMAL LOW (ref 4.22–5.81)
RDW: 14.9 % (ref 11.5–15.5)
WBC: 7.7 10*3/uL (ref 4.0–10.5)
nRBC: 0 % (ref 0.0–0.2)

## 2021-03-26 LAB — GLUCOSE, CAPILLARY
Glucose-Capillary: 177 mg/dL — ABNORMAL HIGH (ref 70–99)
Glucose-Capillary: 151 mg/dL — ABNORMAL HIGH (ref 70–99)
Glucose-Capillary: 156 mg/dL — ABNORMAL HIGH (ref 70–99)
Glucose-Capillary: 182 mg/dL — ABNORMAL HIGH (ref 70–99)
Glucose-Capillary: 135 mg/dL — ABNORMAL HIGH (ref 70–99)

## 2021-03-26 LAB — MAGNESIUM: Magnesium: 1.6 mg/dL — ABNORMAL LOW (ref 1.7–2.4)

## 2021-03-26 LAB — SARS CORONAVIRUS 2 BY RT PCR (HOSPITAL ORDER, PERFORMED IN ~~LOC~~ HOSPITAL LAB): SARS Coronavirus 2: NEGATIVE

## 2021-03-26 SURGERY — RIGHT HEART CATH
Anesthesia: LOCAL

## 2021-03-26 MED ORDER — MAGNESIUM SULFATE 2 GM/50ML IV SOLN: 2.0000 g | Freq: Once | INTRAVENOUS | Status: DC

## 2021-03-26 MED ORDER — POTASSIUM CHLORIDE CRYS ER 20 MEQ PO TBCR
40.0000 meq | EXTENDED_RELEASE_TABLET | Freq: Once | ORAL | Status: AC
  Administered 2021-03-26: 40 meq via ORAL
  Filled 2021-03-26: qty 2

## 2021-03-26 MED ORDER — MELATONIN 3 MG PO TABS
3.0000 mg | ORAL_TABLET | Freq: Every day | ORAL | Status: DC
  Administered 2021-03-26 – 2021-03-27 (×2): 3 mg via ORAL
  Filled 2021-03-26 (×4): qty 1

## 2021-03-26 MED ORDER — LIDOCAINE HCL (PF) 1 % IJ SOLN
INTRAMUSCULAR | Status: DC | PRN
  Administered 2021-03-26: 2 mL

## 2021-03-26 MED ORDER — HEPARIN (PORCINE) IN NACL 1000-0.9 UT/500ML-% IV SOLN
INTRAVENOUS | Status: AC
  Filled 2021-03-26: qty 500

## 2021-03-26 MED ORDER — LIDOCAINE HCL (PF) 1 % IJ SOLN
INTRAMUSCULAR | Status: AC
  Filled 2021-03-26: qty 30

## 2021-03-26 MED ORDER — METOLAZONE 2.5 MG PO TABS
2.5000 mg | ORAL_TABLET | Freq: Once | ORAL | Status: AC
  Administered 2021-03-26: 2.5 mg via ORAL
  Filled 2021-03-26: qty 1

## 2021-03-26 MED ORDER — MAGNESIUM SULFATE 2 GM/50ML IV SOLN
2.0000 g | Freq: Once | INTRAVENOUS | Status: AC
  Administered 2021-03-26: 2 g via INTRAVENOUS
  Filled 2021-03-26: qty 50

## 2021-03-26 MED ORDER — POTASSIUM CHLORIDE 10 MEQ/100ML IV SOLN
10.0000 meq | INTRAVENOUS | Status: AC
  Administered 2021-03-26 (×2): 10 meq via INTRAVENOUS
  Filled 2021-03-26 (×2): qty 100

## 2021-03-26 MED ORDER — HEPARIN (PORCINE) IN NACL 1000-0.9 UT/500ML-% IV SOLN
INTRAVENOUS | Status: DC | PRN
  Administered 2021-03-26: 500 mL

## 2021-03-26 SURGICAL SUPPLY — 6 items
CATH BALLN WEDGE 5F 110CM (CATHETERS) ×2 IMPLANT
KIT HEART LEFT (KITS) ×2 IMPLANT
PACK CARDIAC CATHETERIZATION (CUSTOM PROCEDURE TRAY) ×2 IMPLANT
SHEATH GLIDE SLENDER 4/5FR (SHEATH) ×2 IMPLANT
SHEATH PROBE COVER 6X72 (BAG) ×2 IMPLANT
TRANSDUCER W/STOPCOCK (MISCELLANEOUS) ×2 IMPLANT

## 2021-03-26 NOTE — Progress Notes (Signed)
HD#3 SUBJECTIVE:  Patient Summary: Adam James is a 50 y.o. 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   Overnight Events: Melatonin ordered for sleep.  Interim History: This is hospital day 3 for Mr. [name] who was seen and evaluated at the bedside this morning. He reports an improvement in his breathing, although he is unable to lay flat still. Patient did have good urine output yesterday, although he had no significant change in his weight. His lower extremity edema is improved compared to yesterday, as well.   OBJECTIVE:  Vital Signs: Vitals:   03/25/21 0842 03/25/21 1128 03/25/21 2202 03/26/21 0624  BP: 109/78 109/77 131/87 108/77  Pulse: 100 99 (!) 108 97  Resp: 18  20 19   Temp: 98.3 F (36.8 C) 98.4 F (36.9 C) 98.7 F (37.1 C) 99.2 F (37.3 C)  TempSrc: Oral Oral Oral Oral  SpO2: 96% 95% 95% 94%  Weight:      Height:       Supplemental O2: Room Air SpO2: 94 %  Filed Weights   03/23/21 1031 03/24/21 0543 03/25/21 0638  Weight: 122.1 kg 122.4 kg 123.2 kg     Intake/Output Summary (Last 24 hours) at 03/26/2021 03/28/2021 Last data filed at 03/26/2021 0000 Gross per 24 hour  Intake 583 ml  Output 3750 ml  Net -3167 ml   Net IO Since Admission: -2,376.89 mL [03/26/21 0713]  Physical Exam: General: Alert, well-appearing male laying in bed. No acute distress. CV: RRR. No murmurs, rubs, or gallops. Trace edema bilateral lower extremities. +JVD  Pulmonary: Lungs CTAB. Normal effort. No wheezing or rales. Abdominal: Soft, nontender, nondistended. Normal bowel sounds. Extremities: Palpable radial and DP pulses. Decreased bulk and tone of left leg s/p extensive debridement and resection of lateral compartment.  Skin: Warm  and dry. No obvious rash or lesions. Neuro: A&Ox3. Moves all extremities. Normal sensation. No focal deficit. Psych: Normal mood and affect    ASSESSMENT/PLAN:  Assessment: Principal Problem:   Acute on chronic heart failure (HCC) Active Problems:   Type 2 diabetes mellitus (HCC)   Gangrene of left foot (HCC)   Necrotizing fasciitis (HCC)   Diabetic polyneuropathy associated with type 2 diabetes mellitus (HCC)   NSTEMI (non-ST elevated myocardial infarction) (HCC)   Venous stasis ulcer of right calf limited to breakdown of skin without varicose veins (HCC)   Non-pressure chronic ulcer of other part of left foot limited to breakdown of skin (HCC)   Osteomyelitis (HCC)   Acute on chronic combined systolic and diastolic CHF (congestive heart failure) (HCC)   Plan: #Acute on chronic combined systolic and diastolic heart failure #CAD Patient was scheduled for CABG in 02/2021, however, was admitted for left diabetic foot infection that progressed to necrotizing fasciitis and put his CABG on hold. Patient does still need CABG, but needs to finish course of antibiotics prior to this. Patient also had his diuresis resumed yesterday with lasix 100mg  IV BID. Patient had 3.7 L urine output, although his weight remains unchanged. Patient still remains volume overloaded on exam with JVD and trace bilateral lower extremity edema.  - Cardiology consulted, appreciate recommendations  - Advanced heart failure team consulted, appreciate recs  - Will need repeat coronary angio prior to CABG  - RHC today to assess filling pressures  and CO - Continue lasix 100 mg IV BID - Started on metolazone 2.5 mg daily  - Fluid restriction to 1.5 L/day - Strict I&Os and daily weights   #Anemia likely 2/2 to erosive esophagitis Patient intermittently required RBC transfusions during the course of his last hospitalization. EGD performed showed severe erosive esophagitis and patient was started on PPI and carafate and  recommended to have repeat EGD in 8 weeks. Patient denies any hematuria, melena, or hematochezia at this time. S/p 1 unit of blood on 8/29 and Hb increased appropriately. Hb stable at 7.7 today.  - Continue to monitor CBC - Transfuse if Hb <7 - Will consider GI consult if Hb continues to drop   #T2DM with diabetic neuropathy  HbA1c 8.8%.Goal blood sugar < 180 to promote wound healing. CBGs 170-190s over the last 24h.  -15u semglee bedtime, novoLOG 8u tid with meals, and SSI - Hold metformin due to AKI - CBG monitoring    #Acute kidney injury on chronic Kidney diease stage 3a Cr remains the same, 2.05 today. Likely from volume overload in setting of acute on chronic HFrEF exacerbation, although the Cr did bump with lasix use on 8/28. Continuing diuresis today. 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 He remains afebrile, vitals are stable. No leukocytosis. Continuing on home oral antibiotics at this time per ID. Ortho continues to recommend compression socks for his right lower extremity and a sock to be worn on his left lower extremity with no dressing between the wound and the sock. Also recommended to continue with fracture boot on the left. Will require ortho follow up on discharge  - ID consulted, appreciate recommendations - continuing doxycyline and Augmentin  - Ortho consulted, appreciate recommendations - Wound care consult - Trend CBC      Best Practice: Diet: Cardiac diet IVF: Fluids: none VTE: enoxaparin (LOVENOX) injection 40 mg Start: 03/23/21 1000 Code: Full AB: Doxy and augmentin DISPO: Anticipated discharge to Home pending  clinical improvement .  Signature: Elza Rafter, D.O.  Internal Medicine Resident,  PGY-1 [facility]  Pager: [phone] 7:13 AM, 03/26/2021   Please contact the on call pager after 5 pm and on weekends at [phone].

## 2021-03-26 NOTE — Progress Notes (Signed)
Heart Failure Nurse Navigator Progress Note  Assessed for Heart & Vascular TOC clinic readiness.  Patient does not meet criteria due to advanced heart failure rounding team has been consulted prior to interview.  Completed [facility].    Ozella Rocks, MSN, RN Heart Failure Nurse Navigator [phone]

## 2021-03-26 NOTE — Progress Notes (Signed)
OT Cancellation Note  Patient Details Name: Adam James MRN: [id number] DOB: Oct 27, 1970   Cancelled Treatment:    Reason Eval/Treat Not Completed: Patient at procedure or test/ unavailable  [name],[name] 03/26/2021, 1:19 PM Luisa Dago, OT/L   Acute OT Clinical Specialist Acute Rehabilitation Services Pager [phone] Office [phone]

## 2021-03-26 NOTE — Progress Notes (Signed)
PT Cancellation Note  Patient Details Name: Adam James MRN: [id number] DOB: [birth year]   Cancelled Treatment:    Reason Eval/Treat Not Completed: Patient at procedure or test/unavailable. We will follow-up as schedule allows.   Johnn Hai, SPT Johnn Hai 03/26/2021, 1:04 PM

## 2021-03-26 NOTE — TOC Initial Note (Signed)
Transition of Care [facility]) - Initial/Assessment Note    Patient Details  Name: Adam James MRN: [id number] Date of Birth: 19-Sep-1970  [facility] CM/SW Contact:    Elliot Cousin, RN Phone Number: [phone] 03/26/2021, 4:39 PM  Clinical Narrative:                 HF TOC CM spoke to pt and states he does not need HHPT, he has needed DME at home. Lives with his daughter. Contacted [facility] to see if pt was active with HH. States he is able to receive his meds through [facility]. Will use [facility] for meds and have meds delivered to room from [facility] pharmacy.   Expected Discharge Plan: Home/Self Care Barriers to Discharge: Continued Medical Work up   Patient Goals and CMS Choice Patient states their goals for this hospitalization and ongoing recovery are:: just feel better CMS Medicare.gov Compare Post Acute Care list provided to:: Patient    Expected Discharge Plan and Services Expected Discharge Plan: Home/Self Care In-house Referral: Clinical Social Work Discharge Planning Services: CM Consult Post Acute Care Choice:  (rolling walker, cane) Living arrangements for the past 2 months: Single Family Home                                      Prior Living Arrangements/Services Living arrangements for the past 2 months: Single Family Home Lives with:: Adult Children Patient language and need for interpreter reviewed:: Yes Do you feel safe going back to the place where you live?: Yes      Need for Family Participation in Patient Care: No (Comment) Care giver support system in place?: No (comment) Current home services: DME (rolling walker, cane) Criminal Activity/Legal Involvement Pertinent to Current Situation/Hospitalization: No - Comment as needed  Activities of Daily Living      Permission Sought/Granted Permission sought to share information with : Case Manager, Family Supports, PCP Permission granted to share information with : Yes, Verbal  Permission Granted  Share Information with NAME: [employer]  Permission granted to share info w AGENCY: Home Health  Permission granted to share info w Relationship: daughter  Permission granted to share info w Contact Information: [phone]  Emotional Assessment Appearance:: Appears stated age Attitude/Demeanor/Rapport: Engaged Affect (typically observed): Accepting Orientation: : Oriented to Self, Oriented to Place, Oriented to  Time, Oriented to Situation   Psych Involvement: No (comment)  Admission diagnosis:  NSTEMI (non-ST elevated myocardial infarction) (HCC) [I21.4] Acute on chronic heart failure (HCC) [I50.9] Hypervolemia, unspecified hypervolemia type [E87.70] Patient Active Problem List   Diagnosis Date Noted   Venous stasis ulcer of right calf limited to breakdown of skin without varicose veins (HCC)    Non-pressure chronic ulcer of other part of left foot limited to breakdown of skin (HCC)    Osteomyelitis (HCC)    Acute on chronic combined systolic and diastolic CHF (congestive heart failure) (HCC)    Acute on chronic heart failure (HCC) 03/23/2021   NSTEMI (non-ST elevated myocardial infarction) (HCC)    Gastroesophageal reflux disease with esophagitis and hemorrhage    Anemia, posthemorrhagic, acute    Acute esophagitis    Gastrointestinal hemorrhage    Necrotizing fasciitis (HCC)    Left leg cellulitis    Diabetic polyneuropathy associated with type 2 diabetes mellitus (HCC)    Severe protein-calorie malnutrition (HCC)    Gangrene of left foot (HCC) 02/25/2021  Type 2 diabetes mellitus (HCC)    Heart failure (HCC)    Dyslipidemia    Diabetic foot ulcer (HCC)    CAD (coronary artery disease)    Cardiomyopathy (HCC)    Coronary artery disease involving native coronary artery of native heart with angina pectoris (HCC)    PCP:  Patient, No Pcp Per (Inactive) Pharmacy:   [facility] [address] [place]  [place] [address] Phone: [phone] Fax: [phone]  [facility] [address] [place] [address] Phone: [phone] Fax: [phone]  [facility] [place], [place] - [address], [place] [address] [place] [address] Phone: [phone] Fax: [phone]     Social Determinants of Health (SDOH) Interventions Financial Strain Interventions: Other (Comment) (HF TOC team consulted) Transportation Interventions: [facility]  Readmission Risk Interventions No flowsheet data found.

## 2021-03-26 NOTE — H&P (View-Only) (Signed)
Patient ID: Adam James, male   DOB: 06/30/1971, 50 y.o.   MRN: [id number]     Advanced Heart Failure Rounding Note  PCP-Cardiologist: Norman Herrlich, MD   Subjective:    Good diuresis yesterday, breathing better.  Creatinine stable at 2.05.  No weight yet. Edema improved.    Objective:   Weight Range: 123.2 kg Body mass index is 33.94 kg/m.   Vital Signs:   Temp:  [98.3 F (36.8 C)-99.2 F (37.3 C)] 99.2 F (37.3 C) (08/31 0624) Pulse Rate:  [97-108] 97 (08/31 0624) Resp:  [18-20] 19 (08/31 0624) BP: (108-131)/(77-87) 108/77 (08/31 0624) SpO2:  [94 %-96 %] 94 % (08/31 0624) Last BM Date: 03/25/21  Weight change: Filed Weights   03/23/21 1031 03/24/21 0543 03/25/21 7741  Weight: 122.1 kg 122.4 kg 123.2 kg    Intake/Output:   Intake/Output Summary (Last 24 hours) at 03/26/2021 0817 Last data filed at 03/26/2021 2878 Gross per 24 hour  Intake 643 ml  Output 3750 ml  Net -3107 ml      Physical Exam    General:  Well appearing. No resp difficulty HEENT: Normal Neck: Supple. JVP 14 cm. Carotids 2+ bilat; no bruits. No lymphadenopathy or thyromegaly appreciated. Cor: PMI nondisplaced. Regular rate & rhythm. No S3.  Lungs: Clear Abdomen: Soft, nontender, nondistended. No hepatosplenomegaly. No bruits or masses. Good bowel sounds. Extremities: No cyanosis, clubbing, rash.  Trace ankle edema. Left lower leg in boot.  Neuro: Alert & orientedx3, cranial nerves grossly intact. moves all 4 extremities w/o difficulty. Affect pleasant   Telemetry   NSR, personally reviewed  Labs    CBC Recent Labs    03/25/21 0233 03/26/21 0305  WBC 8.1 7.7  HGB 7.9* 7.7*  HCT 24.5* 23.4*  MCV 91.4 91.1  PLT 249 241   Basic Metabolic Panel Recent Labs    67/67/20 0233 03/26/21 0305  NA 135 134*  K 3.8 3.2*  CL 101 100  CO2 25 27  GLUCOSE 176* 190*  BUN 20 19  CREATININE 2.06* 2.05*  CALCIUM 8.1* 7.9*  MG 1.9 1.6*   Liver Function Tests Recent Labs     03/24/21 0540  AST 11*  ALT 11  ALKPHOS 55  BILITOT 0.7  PROT 5.5*  ALBUMIN 1.7*   No results for input(s): LIPASE, AMYLASE in the last 72 hours. Cardiac Enzymes No results for input(s): CKTOTAL, CKMB, CKMBINDEX, TROPONINI in the last 72 hours.  BNP: BNP (last 3 results) Recent Labs    02/25/21 0149 03/23/21 0045  BNP 103.1* 1,129.9*    ProBNP (last 3 results) Recent Labs    10/07/20 1004  PROBNP 468*     D-Dimer No results for input(s): DDIMER in the last 72 hours. Hemoglobin A1C No results for input(s): HGBA1C in the last 72 hours. Fasting Lipid Panel No results for input(s): CHOL, HDL, LDLCALC, TRIG, CHOLHDL, LDLDIRECT in the last 72 hours. Thyroid Function Tests No results for input(s): TSH, T4TOTAL, T3FREE, THYROIDAB in the last 72 hours.  Invalid input(s): FREET3  Other results:   Imaging    No results found.   Medications:     Scheduled Medications:  amoxicillin-clavulanate  1 tablet Oral Q12H   aspirin EC  81 mg Oral Daily   atorvastatin  80 mg Oral Daily   Chlorhexidine Gluconate Cloth  6 each Topical Daily   doxycycline  100 mg Oral Q12H   enoxaparin (LOVENOX) injection  40 mg Subcutaneous Daily   insulin aspart  0-15 Units Subcutaneous  TID WC   insulin aspart  0-5 Units Subcutaneous QHS   insulin aspart  3 Units Subcutaneous TID WC   insulin glargine-yfgn  15 Units Subcutaneous QHS   melatonin  3 mg Oral QHS   metolazone  2.5 mg Oral Once   metoprolol succinate  50 mg Oral Daily   pantoprazole  40 mg Oral BID AC   potassium chloride  40 mEq Oral Once   potassium chloride  40 mEq Oral Once   sodium chloride flush  3 mL Intravenous Q12H   sucralfate  1 g Oral TID WC & HS    Infusions:  sodium chloride     sodium chloride     sodium chloride 10 mL/hr at 03/26/21 4008   furosemide 100 mg (03/25/21 1728)   magnesium sulfate bolus IVPB     potassium chloride      PRN Medications: sodium chloride, sodium chloride, sodium chloride  flush, sodium chloride flush   Assessment/Plan   1. CAD: LHC in 2/22 with 50% dLM, 70% ramus, 70% pLCx, totally occluded proximal RCA, 90% stenosis ostial to mid LAD, 90% mid LAD, totally occluded distal LAD.  Cardiac MRI in 4/22 showed EF 40% with substantial viable myocardium, only delayed enhancement was subendocardial in the mid to apical inferior wall.  With this information, CABG had been planned in 8/22, but patient was admitted prior with left foot diabetic foot infection that progressed to necrotizing fasciitis.  Patient is now s/p extensive left lower leg debridement.  He has completed IV abx and is on po abx at this point.  Ideally, would have CABG.  However, need to have further healing of the left lower leg.  Also, will not be able to use veins from the left leg, and with ulceration on the right lower leg, I am not sure venous harvest from the right leg would be feasible (would likely need all arterial conduits). Finally, I am concerned for low output HF with sinus tachycardia and low co-ox previously this hospitalization.   - Continue ASA 81 daily.  - Continue atorvastatin 80 mg daily.  - As above, would ideally have CABG, but needs substantial "tuning" before he is a candidate.  - Will need repeat coronary angiography prior to CABG.  Will do this when creatinine is stable/optimized and after completion of diuresis.  2. Acute on chronic systolic CHF: With concern for low output HF.  Ischemic cardiomyopathy.  Initial echo in 2/22 with EF 40-45%.  Echo in 7/22 with EF 30-35%.  Echo in 8/22 with EF down to 25% with mild RV dysfunction and dilated IVC.  He was admitted this time with CHF exacerbation.  He is volume overloaded on exam but has improved with diuresis.  Creatinine is 2.06, which appears to be around his baseline currently.  On exam, he is volume overloaded.  Co-ox off tunneled catheter (which was removed) was 54.8% on 8/29, suggesting at least borderline low output.  Medication  titration will be limited by CKD.  Good diuresis yesterday.  - Today, he will get metolazone 2.5 x 1 + Lasix 100 mg IV bid.  Will adjust based on RHC.   - We will plan on RHC today to assess filling pressures and cardiac output.  I discussed risks/benefits with patient and he agrees to procedure.  - Can continue Toprol XL 50 mg daily for now, if CO low on RHC tomorrow will cut back.  - For now, no Entresto or spironolactone with elevated creatinine.  3.  CKD: Stage 3.  Creatinine is 2.05 today which may be around his baseline.  Will follow closely.  4. GI bleeding: History of erosive esophagitis earlier in 8/22.  He is now on Protonix.  Hgb 7.7 after transfusion this admission.  - Follow hgb closely.  5. Left leg diabetic foot infection => necrotizing fasciitis: He has completed IV abx and is s/p debridement.  Now on doxycycline/Augmentin, will need to complete course.  6. RLE ulceration: Suspect venous stasis.  Stable.  7. DM2: SSI.  8. Mobilize with PT.   Length of Stay: 2  Marca Ancona, MD  03/26/2021, 8:17 AM  Advanced Heart Failure Team Pager [phone] (M-F; 7a - 5p)  Please contact [facility] for night-coverage after hours (5p -7a ) and weekends on [email or web address]

## 2021-03-26 NOTE — Progress Notes (Addendum)
Patient ID: Adam James, male   DOB: 06/30/1971, 50 y.o.   MRN: [id number]     Advanced Heart Failure Rounding Note  PCP-Cardiologist: Norman Herrlich, MD   Subjective:    Good diuresis yesterday, breathing better.  Creatinine stable at 2.05.  No weight yet. Edema improved.    Objective:   Weight Range: 123.2 kg Body mass index is 33.94 kg/m.   Vital Signs:   Temp:  [98.3 F (36.8 C)-99.2 F (37.3 C)] 99.2 F (37.3 C) (08/31 0624) Pulse Rate:  [97-108] 97 (08/31 0624) Resp:  [18-20] 19 (08/31 0624) BP: (108-131)/(77-87) 108/77 (08/31 0624) SpO2:  [94 %-96 %] 94 % (08/31 0624) Last BM Date: 03/25/21  Weight change: Filed Weights   03/23/21 1031 03/24/21 0543 03/25/21 7741  Weight: 122.1 kg 122.4 kg 123.2 kg    Intake/Output:   Intake/Output Summary (Last 24 hours) at 03/26/2021 0817 Last data filed at 03/26/2021 2878 Gross per 24 hour  Intake 643 ml  Output 3750 ml  Net -3107 ml      Physical Exam    General:  Well appearing. No resp difficulty HEENT: Normal Neck: Supple. JVP 14 cm. Carotids 2+ bilat; no bruits. No lymphadenopathy or thyromegaly appreciated. Cor: PMI nondisplaced. Regular rate & rhythm. No S3.  Lungs: Clear Abdomen: Soft, nontender, nondistended. No hepatosplenomegaly. No bruits or masses. Good bowel sounds. Extremities: No cyanosis, clubbing, rash.  Trace ankle edema. Left lower leg in boot.  Neuro: Alert & orientedx3, cranial nerves grossly intact. moves all 4 extremities w/o difficulty. Affect pleasant   Telemetry   NSR, personally reviewed  Labs    CBC Recent Labs    03/25/21 0233 03/26/21 0305  WBC 8.1 7.7  HGB 7.9* 7.7*  HCT 24.5* 23.4*  MCV 91.4 91.1  PLT 249 241   Basic Metabolic Panel Recent Labs    67/67/20 0233 03/26/21 0305  NA 135 134*  K 3.8 3.2*  CL 101 100  CO2 25 27  GLUCOSE 176* 190*  BUN 20 19  CREATININE 2.06* 2.05*  CALCIUM 8.1* 7.9*  MG 1.9 1.6*   Liver Function Tests Recent Labs     03/24/21 0540  AST 11*  ALT 11  ALKPHOS 55  BILITOT 0.7  PROT 5.5*  ALBUMIN 1.7*   No results for input(s): LIPASE, AMYLASE in the last 72 hours. Cardiac Enzymes No results for input(s): CKTOTAL, CKMB, CKMBINDEX, TROPONINI in the last 72 hours.  BNP: BNP (last 3 results) Recent Labs    02/25/21 0149 03/23/21 0045  BNP 103.1* 1,129.9*    ProBNP (last 3 results) Recent Labs    10/07/20 1004  PROBNP 468*     D-Dimer No results for input(s): DDIMER in the last 72 hours. Hemoglobin A1C No results for input(s): HGBA1C in the last 72 hours. Fasting Lipid Panel No results for input(s): CHOL, HDL, LDLCALC, TRIG, CHOLHDL, LDLDIRECT in the last 72 hours. Thyroid Function Tests No results for input(s): TSH, T4TOTAL, T3FREE, THYROIDAB in the last 72 hours.  Invalid input(s): FREET3  Other results:   Imaging    No results found.   Medications:     Scheduled Medications:  amoxicillin-clavulanate  1 tablet Oral Q12H   aspirin EC  81 mg Oral Daily   atorvastatin  80 mg Oral Daily   Chlorhexidine Gluconate Cloth  6 each Topical Daily   doxycycline  100 mg Oral Q12H   enoxaparin (LOVENOX) injection  40 mg Subcutaneous Daily   insulin aspart  0-15 Units Subcutaneous  TID WC   insulin aspart  0-5 Units Subcutaneous QHS   insulin aspart  3 Units Subcutaneous TID WC   insulin glargine-yfgn  15 Units Subcutaneous QHS   melatonin  3 mg Oral QHS   metolazone  2.5 mg Oral Once   metoprolol succinate  50 mg Oral Daily   pantoprazole  40 mg Oral BID AC   potassium chloride  40 mEq Oral Once   potassium chloride  40 mEq Oral Once   sodium chloride flush  3 mL Intravenous Q12H   sucralfate  1 g Oral TID WC & HS    Infusions:  sodium chloride     sodium chloride     sodium chloride 10 mL/hr at 03/26/21 4008   furosemide 100 mg (03/25/21 1728)   magnesium sulfate bolus IVPB     potassium chloride      PRN Medications: sodium chloride, sodium chloride, sodium chloride  flush, sodium chloride flush   Assessment/Plan   1. CAD: LHC in 2/22 with 50% dLM, 70% ramus, 70% pLCx, totally occluded proximal RCA, 90% stenosis ostial to mid LAD, 90% mid LAD, totally occluded distal LAD.  Cardiac MRI in 4/22 showed EF 40% with substantial viable myocardium, only delayed enhancement was subendocardial in the mid to apical inferior wall.  With this information, CABG had been planned in 8/22, but patient was admitted prior with left foot diabetic foot infection that progressed to necrotizing fasciitis.  Patient is now s/p extensive left lower leg debridement.  He has completed IV abx and is on po abx at this point.  Ideally, would have CABG.  However, need to have further healing of the left lower leg.  Also, will not be able to use veins from the left leg, and with ulceration on the right lower leg, I am not sure venous harvest from the right leg would be feasible (would likely need all arterial conduits). Finally, I am concerned for low output HF with sinus tachycardia and low co-ox previously this hospitalization.   - Continue ASA 81 daily.  - Continue atorvastatin 80 mg daily.  - As above, would ideally have CABG, but needs substantial "tuning" before he is a candidate.  - Will need repeat coronary angiography prior to CABG.  Will do this when creatinine is stable/optimized and after completion of diuresis.  2. Acute on chronic systolic CHF: With concern for low output HF.  Ischemic cardiomyopathy.  Initial echo in 2/22 with EF 40-45%.  Echo in 7/22 with EF 30-35%.  Echo in 8/22 with EF down to 25% with mild RV dysfunction and dilated IVC.  He was admitted this time with CHF exacerbation.  He is volume overloaded on exam but has improved with diuresis.  Creatinine is 2.06, which appears to be around his baseline currently.  On exam, he is volume overloaded.  Co-ox off tunneled catheter (which was removed) was 54.8% on 8/29, suggesting at least borderline low output.  Medication  titration will be limited by CKD.  Good diuresis yesterday.  - Today, he will get metolazone 2.5 x 1 + Lasix 100 mg IV bid.  Will adjust based on RHC.   - We will plan on RHC today to assess filling pressures and cardiac output.  I discussed risks/benefits with patient and he agrees to procedure.  - Can continue Toprol XL 50 mg daily for now, if CO low on RHC tomorrow will cut back.  - For now, no Entresto or spironolactone with elevated creatinine.  3.  CKD: Stage 3.  Creatinine is 2.05 today which may be around his baseline.  Will follow closely.  4. GI bleeding: History of erosive esophagitis earlier in 8/22.  He is now on Protonix.  Hgb 7.7 after transfusion this admission.  - Follow hgb closely.  5. Left leg diabetic foot infection => necrotizing fasciitis: He has completed IV abx and is s/p debridement.  Now on doxycycline/Augmentin, will need to complete course.  6. RLE ulceration: Suspect venous stasis.  Stable.  7. DM2: SSI.  8. Mobilize with PT.   Length of Stay: 2  Marca Ancona, MD  03/26/2021, 8:17 AM  Advanced Heart Failure Team Pager [phone] (M-F; 7a - 5p)  Please contact [facility] for night-coverage after hours (5p -7a ) and weekends on [email or web address]

## 2021-03-26 NOTE — Interval H&P Note (Signed)
History and Physical Interval Note:  03/26/2021 12:52 PM  Adam James  has presented today for surgery, with the diagnosis of chf.  The various methods of treatment have been discussed with the patient and family. After consideration of risks, benefits and other options for treatment, the patient has consented to  Procedure(s): RIGHT HEART CATH (N/A) as a surgical intervention.  The patient's history has been reviewed, patient examined, no change in status, stable for surgery.  I have reviewed the patient's chart and labs.  Questions were answered to the patient's satisfaction.     Obadiah Dennard [name]

## 2021-03-26 NOTE — Progress Notes (Signed)
Patient is sleeping scheduled for cardiac procedure today.  Comfortable    Compression socks are in place and appropriately fitting.  Will need follow-up in our office once discharged

## 2021-03-26 NOTE — Progress Notes (Signed)
CSW received a secure chat from outpatient CSW, Rutherford Nail regarding Mr. Dunker. Per CSW, Rutherford Nail: "Mr. Shams sees [employer] at [employer], he lives with his daughter [place] and according to their last conversation he was not currently employed due to ongoing health challenges. Mr. Monnin has been enrolled in [facility] and can call [phone] to get to any [facility] appointments as needed. Mr. Kelley had been provided with both [facility] clinics list and [facility] clinic information to establish primary care. Mr. Panas has been approved for medication assistance through [facility] mail order pharmacy program for the following medication: amlodipine, atorvastatin, aspirin and furosemide and was encouraged to utilize [facility] to fill the rest of his medications. He has disability pending, but previously had not been found to be Medicaid eligible during previous admission and prior to that admission he had started a [employer] application and was missing several documents (an update of what was missing had been mailed to him but then he never followed up with CSW, [place])."  10:05am - CSW called the [facility] to see about sending updated clinicals from the current hospital visit for the patients disability application and was transferred to a voicemail. CSW left a message for the [facility] to please return the call.  CSW will continue to follow throughout discharge.  Chapman Matteucci, MSW, LCSWA [phone] Heart Failure Social Worker

## 2021-03-27 LAB — BASIC METABOLIC PANEL
Anion gap: 8 (ref 5–15)
BUN: 17 mg/dL (ref 6–20)
CO2: 27 mmol/L (ref 22–32)
Calcium: 8.1 mg/dL — ABNORMAL LOW (ref 8.9–10.3)
Chloride: 99 mmol/L (ref 98–111)
Creatinine, Ser: 2.07 mg/dL — ABNORMAL HIGH (ref 0.61–1.24)
GFR, Estimated: 39 mL/min — ABNORMAL LOW (ref >60)
Glucose, Bld: 212 mg/dL — ABNORMAL HIGH (ref 70–99)
Potassium: 4 mmol/L (ref 3.5–5.1)
Sodium: 134 mmol/L — ABNORMAL LOW (ref 135–145)

## 2021-03-27 LAB — CBC
HCT: 26.3 % — ABNORMAL LOW (ref 39.0–52.0)
Hemoglobin: 8.7 g/dL — ABNORMAL LOW (ref 13.0–17.0)
MCH: 29.9 pg (ref 26.0–34.0)
MCHC: 33.1 g/dL (ref 30.0–36.0)
MCV: 90.4 fL (ref 80.0–100.0)
Platelets: 258 10*3/uL (ref 150–400)
RBC: 2.91 MIL/uL — ABNORMAL LOW (ref 4.22–5.81)
RDW: 14.8 % (ref 11.5–15.5)
WBC: 7.6 10*3/uL (ref 4.0–10.5)
nRBC: 0 % (ref 0.0–0.2)

## 2021-03-27 LAB — MAGNESIUM: Magnesium: 1.7 mg/dL (ref 1.7–2.4)

## 2021-03-27 LAB — GLUCOSE, CAPILLARY
Glucose-Capillary: 185 mg/dL — ABNORMAL HIGH (ref 70–99)
Glucose-Capillary: 162 mg/dL — ABNORMAL HIGH (ref 70–99)
Glucose-Capillary: 141 mg/dL — ABNORMAL HIGH (ref 70–99)
Glucose-Capillary: 168 mg/dL — ABNORMAL HIGH (ref 70–99)

## 2021-03-27 MED ORDER — SODIUM CHLORIDE 0.9 % IV SOLN: 250.0000 mL | INTRAVENOUS | Status: DC | PRN

## 2021-03-27 MED ORDER — MAGNESIUM SULFATE 2 GM/50ML IV SOLN
2.0000 g | Freq: Once | INTRAVENOUS | Status: AC
  Administered 2021-03-27: 2 g via INTRAVENOUS
  Filled 2021-03-27: qty 50

## 2021-03-27 MED ORDER — SODIUM CHLORIDE 0.9 % IV SOLN: INTRAVENOUS | Status: DC

## 2021-03-27 MED ORDER — SODIUM CHLORIDE 0.9% FLUSH: 3.0000 mL | INTRAVENOUS | Status: DC | PRN

## 2021-03-27 MED ORDER — POTASSIUM CHLORIDE CRYS ER 20 MEQ PO TBCR
40.0000 meq | EXTENDED_RELEASE_TABLET | Freq: Once | ORAL | Status: AC
  Administered 2021-03-27: 40 meq via ORAL
  Filled 2021-03-27: qty 2

## 2021-03-27 MED ORDER — SODIUM CHLORIDE 0.9% FLUSH
3.0000 mL | Freq: Two times a day (BID) | INTRAVENOUS | Status: DC
  Administered 2021-03-28: 3 mL via INTRAVENOUS

## 2021-03-27 NOTE — Progress Notes (Signed)
Patient has removed his compression stockings. He declines to have them replaced. Placed silvadene ointment and ABD/gauze with kerlix per patient request.  Patient also declines bed alarm and wants privacy getting out of bed to go to bathroom. Education provided to prevent fall. Patient still would not like assistance.

## 2021-03-27 NOTE — Progress Notes (Signed)
CARDIAC REHAB PHASE I   Offered to walk with pt. Pt states he has been walking around the room independently without difficulty and does not need rehab. Pt also waiting on nurse to wrap his legs so he can put on his boots. Provided support and encouragement as pt voiced his concerns. Pt states he may be willing to walk later but needs time to rest right now. Pt requesting assistance from RN, Diplomatic Services operational officer made aware. Will continue to follow.  [id number] Reynold Bowen, RN BSN 03/27/2021 9:38 AM

## 2021-03-27 NOTE — Progress Notes (Signed)
PT Cancellation Note  Patient Details Name: Adam James MRN: [id number] DOB: [birth year]   Cancelled Treatment:    Reason Eval/Treat Not Completed: Fatigue/lethargy limiting ability to participate;Other (comment).  Pt is reporting he is not sleeping, will try again as time and pt allow.   Ivar Drape 03/27/2021, 2:30 PM  Samul Dada, PT MS Acute Rehab Dept. Number: [facility] [id number] and [facility] [phone]

## 2021-03-27 NOTE — TOC Progression Note (Addendum)
Transition of Care (TOC) - Progression Note  Heart Failure   Patient Details  Name: Adam James MRN: [id number] Date of Birth: 01/03/1971  Transition of Care [facility]) CM/SW Contact  Anayeli Arel, LCSWA Phone Number: 03/27/2021, 9:59 AM  Clinical Narrative:    CSW again left a voicemail for the director of disability, Helmut Muster at the [facility] regarding the patients disability application and how to send further clinicals. Awaiting a call back and CSW will continue to outreach.  CSW also reached out to [employer] for follow up regarding Medicaid for the patient.  CSW received a message back from [employer] reporting "It looks like his most recent Medicaid application was denied for not being disabled and he was given [facility] only.  [employer] did screen him again on his last admission in August and he was found to still not qualify for Medicaid at this time."  04/01/21 - CSW received a call back from the [facility] at 9:02 am indicating that his disability specialist is Lorelei Pont. And she will pull his records in the system of the recent hospitalization and will get in touch with the CSW if she is unable to access the records.  CSW will continue to follow throughout discharge.    Expected Discharge Plan: Home/Self Care Barriers to Discharge: Continued Medical Work up  Expected Discharge Plan and Services Expected Discharge Plan: Home/Self Care In-house Referral: Clinical Social Work Discharge Planning Services: CM Consult Post Acute Care Choice:  (rolling walker, cane) Living arrangements for the past 2 months: Single Family Home                                       Social Determinants of Health (SDOH) Interventions Financial Strain Interventions: Other (Comment) (HF TOC team consulted) Transportation Interventions: [facility]  Readmission Risk Interventions No flowsheet data found.  Ameirah Khatoon, MSW, LCSWA [phone] Heart Failure  Social Worker

## 2021-03-27 NOTE — Progress Notes (Signed)
HD#4 SUBJECTIVE:  Patient Summary: Adam James is a 50 y.o. with a pertinent PMH of T2DM, ischemic heart disease, chronic heart failure with reduced ejection fraction, CKD stage IIIA, and left diabetic foot infection with gas gangrene and necrotizing fasciitis, who presented with shortness of breath and admitted for acute on chronic HFrEF exacerbation.   Overnight Events: None.  Interim History: This is hospital day 4 for Adam James. He was seen and evaluated at the bedside this morning. He reports continued inability to lay flat but denies chest pain or shortness of breath. Since admission he is - ~4.9L and 3 kgs. Lower extremity edema remains present.  OBJECTIVE:  Vital Signs: Vitals:   03/26/21 1330 03/26/21 1957 03/27/21 0354 03/27/21 1151  BP: 124/86 114/66 99/66 97/72   Pulse: (!) 109 98 97 96  Resp:  18 18 18   Temp: 98.4 F (36.9 C) 97.8 F (36.6 C) 98.5 F (36.9 C) 99 F (37.2 C)  TempSrc: Oral Oral Oral Oral  SpO2: 99% 95% 97% 96%  Weight:   116.1 kg   Height:       Supplemental O2: Room Air SpO2: 96 %  Filed Weights   03/25/21 0638 03/26/21 0959 03/27/21 0354  Weight: 123.2 kg 119.1 kg 116.1 kg     Intake/Output Summary (Last 24 hours) at 03/27/2021 1412 Last data filed at 03/27/2021 1330 Gross per 24 hour  Intake 1207.7 ml  Output 2400 ml  Net -1192.3 ml   Net IO Since Admission: -4,934.19 mL [03/27/21 1412]  Physical Exam: Physical Exam Vitals and nursing note reviewed.  Constitutional:      General: He is not in acute distress.    Appearance: Normal appearance.  Cardiovascular:     Rate and Rhythm: Normal rate and regular rhythm.     Pulses:          Radial pulses are 2+ on the right side and 2+ on the left side.       Dorsalis pedis pulses are 2+ on the right side and 2+ on the left side.       Posterior tibial pulses are 2+ on the right side and 2+ on the left side.     Heart sounds: Normal heart sounds.  Pulmonary:     Effort: Pulmonary  effort is normal.     Breath sounds: Normal breath sounds and air entry.  Musculoskeletal:     Comments: RLE with mild swelling. R foot with wound over lateral dorsal aspect that is scabbed, some surrounding redness but no increased warmth. Ulcer on plantar aspect of 4th toe not erythematous, warm. LLE with incision just proximal to interdigit space of 4th-5th toes proximally over anterior aspect of lower leg to lateral knee with sutures. Evidence of wound healing noted on dorsal foot and lower leg, scab over middle dorsal foot that patient states is new. Presence of ulcer on plantar aspect of foot without increased warmth or erythema.(See photos).   Neurological:     General: No focal deficit present.     Mental Status: He is alert and oriented to person, place, and time.  Psychiatric:        Mood and Affect: Mood and affect normal.        Behavior: Behavior is cooperative.       Patient Lines/Drains/Airways Status     Active Line/Drains/Airways     Name Placement date Placement time Site Days   Peripheral IV 03/24/21 1.75" Left;Lateral Forearm 03/24/21  1843  Forearm  3   Incision (Closed) 02/24/21 Leg Left 02/24/21  2326  -- 31   Wound / Incision (Open or Dehisced) 02/25/21 Diabetic ulcer Foot Right 02/25/21  0130  Foot  30             ASSESSMENT/PLAN:  Assessment: Principal Problem:   Acute on chronic heart failure (HCC) Active Problems:   Type 2 diabetes mellitus (HCC)   Gangrene of left foot (HCC)   Necrotizing fasciitis (HCC)   Diabetic polyneuropathy associated with type 2 diabetes mellitus (HCC)   NSTEMI (non-ST elevated myocardial infarction) (HCC)   Venous stasis ulcer of right calf limited to breakdown of skin without varicose veins (HCC)   Non-pressure chronic ulcer of other part of left foot limited to breakdown of skin (HCC)   Osteomyelitis (HCC)   Acute on chronic combined systolic and diastolic CHF (congestive heart failure) (HCC)   Plan: #Acute on  chronic combined systolic and diastolic heart failure #CAD Patient was initially scheduled for CABG in August 2022 however was admitted for L diabetic foot infection that progressed to necrotizing fasciitis, ultimately leading to debridement and resection of the anterior lateral compartment. CABG delayed at that time. While he does still need this procedure, he needs to finish antibiotic course. Currently diuresing and is currently ~ -4.9L since admission, -3 kg. He remains volume overloaded with trace bilateral LE edema. - Cardiology consulted, appreciate recommendations - Advanced heart failure team consulted, appreciate recommendations - Continue lasix 100 mg IV BID - Fluid restriction of 1.5L/day - Strict I/Os and daily weights  #Anemia likely 2/2 to erosive esophagitis Since last hospitalization patient has required multiple transfusions of RBC. EGD was completed 03/12/2021 and showed severe erosive esophagitis and patient was started on PPI and Carafate at this time with recommendation for repeat EGD in 8 weeks. Patient denies hematuria, melena, hematochezia. Last transfusion was 03/24/2021 with appropriate increase of Hb. Hb stable at 8.7 at this time. - Continue to monitor CBC - Transfuse if Hgb <7 - Consider GI consult if Hgb continues to decrease   #T2DM with diabetic neuropathy HbA1c 8.8%; goal BG of <180 to promote wound healing. BG has ranged 135-185 over past 24h. - Hold metformin due to AKI - 15 unites semglee at bedtime, novoLOG 8 units TID with meals, SSI - Continue CBG monitoring  #Acute kidney injury on chronic kidney disease, stage 3A Creatinine relatively unchanged at 2.07 (2.05 yesterday 03/26/2021). He continues to diurese well with overnight output of 2.7L measured output. He is down 15 pounds at this time.  - Mg borderline at 1.7; 1g IV given - Continue following renal function - Continue diuresis with Lasix 100 mg BID; holding metolazone 2.5 mg daily per cardiology  recommendations. - Avoid nephrotoxic medications  #Hx of diabetic foot ulcer bilaterally #Hx of gas gangrene and necrotizing fasciitis of left foot Patient is s/p debridement and resection of the anterior lateral compartment of the L foot with ortho. He continues to be afebrile with stable vital signs, denies pain at this time. No leukocytosis. Home antibiotics of doxycycline and Augmentin per ID recommendations. Recommendations from ortho for wound dressing include compression socks for the RLE and sock without dressing between sock and wound for LLE. He is wearing a fracture boot on the L foot per their recommendations. He will need follow-up with ortho on discharge. - ID consulted, appreciate recommendations. Will continue doxycycline, Augmentin - Orho consulted, appreciate recommendations - Wound care consulted - Follow CBC  Best Practice: Diet: Cardiac diet  IVF: Fluids: None VTE: enoxaparin (LOVENOX) injection 40 mg Start: 03/23/21 1000 Code: Full AB: Doxycycline and augmentin DISPO: Anticipated discharge to Home pending clinical improvement.  Signature: Champ Mungo, D.O.  Internal Medicine Resident, PGY-1 [facility]  Pager: [phone] 2:12 PM, 03/27/2021   Please contact the on call pager after 5 pm and on weekends at [phone].

## 2021-03-27 NOTE — Progress Notes (Signed)
Internal Medicine Attending Note:  I have seen and evaluated this patient and I have discussed the plan of care with the house staff. Please see their note for complete details. I concur with their findings.  Reymundo Poll, MD 03/27/2021, 6:43 PM

## 2021-03-27 NOTE — Progress Notes (Signed)
OT Cancellation Note  Patient Details Name: Adam James MRN: [id number] DOB: October 22, 1970   Cancelled Treatment:    Reason Eval/Treat Not Completed: Patient declined, no reason specified. Pt declined OT, reports trying to rest.  Voiced he has been independently ambulating in his room and using the bathroom. Will follow and see as able.   Barry Brunner, OT Acute Rehabilitation Services Pager [phone] Office [phone]   Chancy Milroy 03/27/2021, 1:13 PM

## 2021-03-27 NOTE — Progress Notes (Addendum)
Patient ID: Adam James, male   DOB: Dec 29, 1970, 50 y.o.   MRN: [id number]     Advanced Heart Failure Rounding Note  PCP-Cardiologist: Norman Herrlich, MD   Subjective:    Breathing much better. Continues to have significant pitting edema in posterior thighs.   Good diuresis overnight. -2.7L + unmeasured voids.  Weight down total of 15 lb.  Creatinine stable at 2.07, K okay, Mag 1.7.  RHC, 08/31: RA mean 7, PCWP mean 21, PA 61%, Fick CO 9.33/CI 3.74  Objective:   Weight Range: 116.1 kg Body mass index is 32 kg/m.   Vital Signs:   Temp:  [97.8 F (36.6 C)-98.9 F (37.2 C)] 98.5 F (36.9 C) (09/01 0354) Pulse Rate:  [0-109] 97 (09/01 0354) Resp:  [11-23] 18 (09/01 0354) BP: (99-124)/(66-86) 99/66 (09/01 0354) SpO2:  [92 %-99 %] 97 % (09/01 0354) Weight:  [116.1 kg-119.1 kg] 116.1 kg (09/01 0354) Last BM Date: 03/26/21  Weight change: Filed Weights   03/25/21 0638 03/26/21 0959 03/27/21 0354  Weight: 123.2 kg 119.1 kg 116.1 kg    Intake/Output:   Intake/Output Summary (Last 24 hours) at 03/27/2021 0713 Last data filed at 03/26/2021 2225 Gross per 24 hour  Intake 582.43 ml  Output 3225 ml  Net -2642.57 ml      Physical Exam    General:  Well appearing. No resp difficulty HEENT: Normal Neck: Supple. JVP 10-12 Carotids 2+ bilat; no bruits. No lymphadenopathy or thyromegaly appreciated. Cor: PMI nondisplaced. Regular rate & rhythm. No S3.  Lungs: Clear Abdomen: Soft, nontender, nondistended. No hepatosplenomegaly. No bruits or masses. Good bowel sounds. Extremities: No cyanosis, clubbing, rash.  Trace ankle edema. Pitting edema posterior thighs. Left lower leg in boot. Right foot wound without drainage or warmth. Neuro: Alert & orientedx3, cranial nerves grossly intact. moves all 4 extremities w/o difficulty. Affect pleasant   Telemetry   Sinus 90s - low 100s  Labs    CBC Recent Labs    03/26/21 0305 03/26/21 1305 03/27/21 0336  WBC 7.7  --  7.6   HGB 7.7* 8.2*  8.2* 8.7*  HCT 23.4* 24.0*  24.0* 26.3*  MCV 91.1  --  90.4  PLT 241  --  258   Basic Metabolic Panel Recent Labs    [birth year] 0305 03/26/21 1305 03/27/21 0336  NA 134* 138  137 134*  K 3.2* 3.6  3.7 4.0  CL 100  --  99  CO2 27  --  27  GLUCOSE 190*  --  212*  BUN 19  --  17  CREATININE 2.05*  --  2.07*  CALCIUM 7.9*  --  8.1*  MG 1.6*  --  1.7   Liver Function Tests No results for input(s): AST, ALT, ALKPHOS, BILITOT, PROT, ALBUMIN in the last 72 hours.  No results for input(s): LIPASE, AMYLASE in the last 72 hours. Cardiac Enzymes No results for input(s): CKTOTAL, CKMB, CKMBINDEX, TROPONINI in the last 72 hours.  BNP: BNP (last 3 results) Recent Labs    02/25/21 0149 03/23/21 0045  BNP 103.1* 1,129.9*    ProBNP (last 3 results) Recent Labs    10/07/20 1004  PROBNP 468*     D-Dimer No results for input(s): DDIMER in the last 72 hours. Hemoglobin A1C No results for input(s): HGBA1C in the last 72 hours. Fasting Lipid Panel No results for input(s): CHOL, HDL, LDLCALC, TRIG, CHOLHDL, LDLDIRECT in the last 72 hours. Thyroid Function Tests No results for input(s): TSH, T4TOTAL, T3FREE, THYROIDAB in  the last 72 hours.  Invalid input(s): FREET3  Other results:   Imaging    CARDIAC CATHETERIZATION  Result Date: 03/26/2021 1. Preserved cardiac output. 2. Mildly elevated PCWP. 3. Mild pulmonary venous hypertension. Continue diuresis today, getting close to euvolemia.     Medications:     Scheduled Medications:  amoxicillin-clavulanate  1 tablet Oral Q12H   aspirin EC  81 mg Oral Daily   atorvastatin  80 mg Oral Daily   Chlorhexidine Gluconate Cloth  6 each Topical Daily   doxycycline  100 mg Oral Q12H   enoxaparin (LOVENOX) injection  40 mg Subcutaneous Daily   insulin aspart  0-15 Units Subcutaneous TID WC   insulin aspart  0-5 Units Subcutaneous QHS   insulin aspart  3 Units Subcutaneous TID WC   insulin glargine-yfgn  15  Units Subcutaneous QHS   melatonin  3 mg Oral QHS   metoprolol succinate  50 mg Oral Daily   pantoprazole  40 mg Oral BID AC   sodium chloride flush  3 mL Intravenous Q12H   sucralfate  1 g Oral TID WC & HS    Infusions:  sodium chloride     furosemide Stopped (03/26/21 1826)   magnesium sulfate bolus IVPB      PRN Medications: sodium chloride, sodium chloride flush   Assessment/Plan   1. CAD: LHC in 2/22 with 50% dLM, 70% ramus, 70% pLCx, totally occluded proximal RCA, 90% stenosis ostial to mid LAD, 90% mid LAD, totally occluded distal LAD.  Cardiac MRI in 4/22 showed EF 40% with substantial viable myocardium, only delayed enhancement was subendocardial in the mid to apical inferior wall.  With this information, CABG had been planned in 8/22, but patient was admitted prior with left foot diabetic foot infection that progressed to necrotizing fasciitis.  Patient is now s/p extensive left lower leg debridement.  He has completed IV abx and is on po abx at this point.  Ideally, would have CABG.  However, need to have further healing of the left lower leg.  Also, will not be able to use veins from the left leg, and with ulceration on the right lower leg, I am not sure venous harvest from the right leg would be feasible (would likely need all arterial conduits).  -Initially concerned for low output HF with sinus tachycardia and low co-ox previously this hospitalization.  Fortunately, CO preserved on RHC 08/31 - Continue ASA 81 daily.  - Continue atorvastatin 80 mg daily.  - As above, would ideally have CABG, but needs substantial "tuning" before he is a candidate.  - Will need repeat coronary angiography prior to CABG.  Currently planned for 09/02 2. Acute on chronic systolic CHF: With concern for low output HF.  Ischemic cardiomyopathy.  Initial echo in 2/22 with EF 40-45%.  Echo in 7/22 with EF 30-35%.  Echo in 8/22 with EF down to 25% with mild RV dysfunction and dilated IVC.  He was  admitted this time with CHF exacerbation.   - Co-ox off tunneled catheter (which was removed) was 54.8% on 8/29, suggesting at least borderline low output.  RHC yesterday with preserved CO. Mildly elevated PCWP, mild pulmonary hypertension. - Good diuresis with 15lb weight loss so far. Creatinine stable.   - Still appears volume up. Will continue diuresis with 100 mg furosemide IV BID. one more day. Hold off on additional metolazone for now. - Can continue Toprol XL 50 mg daily  - For now, no Entresto or spironolactone with elevated  creatinine.  - Avoid SGLT2i with history of necrotizing fasciitis 3. CKD: Stage 3.  Creatinine is 2.07 today which may be around his baseline.  Will follow closely.  4. Hypomagnesemia/hypokalemia: - Mag 1.7. Replace.  - K repleted. Monitor 5. GI bleeding: History of erosive esophagitis earlier in 8/22.  He is now on Protonix.  Hgb up to 8.7 after transfusion this admission.  - Follow hgb closely.  6. Left leg diabetic foot infection => necrotizing fasciitis: He has completed IV abx and is s/p debridement.  Now on doxycycline/Augmentin, will need to complete course.  7. RLE ulceration: Suspect venous stasis.  Stable.  8. DM2: SSI.  9. Mobilize with PT.   Length of Stay: 3  FINCH, LINDSAY N, PA-C  03/27/2021, 7:13 AM  Advanced Heart Failure Team Pager [phone] (M-F; 7a - 5p)  Please contact [facility] for night-coverage after hours (5p -7a ) and weekends on [email or web address]   Patient seen with PA, agree with the above note.   Stable creatinine at 2.07, good diuresis again.  RHC yesterday with preserved cardiac output and mildlly elevated filling pressures.   General: NAD Neck: JVP 12 cm, no thyromegaly or thyroid nodule.  Lungs: Clear to auscultation bilaterally with normal respiratory effort. CV: Nondisplaced PMI.  Heart regular S1/S2, no S3/S4, no murmur.  1+ edema in thighs  Abdomen: Soft, nontender, no hepatosplenomegaly, no distention.  Skin: Intact  without lesions or rashes.  Neurologic: Alert and oriented x 3.  Psych: Normal affect. Extremities: No clubbing or cyanosis. Right foot ulcer, surgical scar LLE.  HEENT: Normal.   Agree that he still has some volume on board.  Would diuresis one more day with IV Lasix, hold off on metolazone.   Tomorrow, will plan coronary angiography to reassess coronary tree prior to CABG (has been > 6 months since last angiogram) as long as creatinine is stable.  Will need to minimize contrast.  Have discussed risks/benefits with patient and he agrees to procedure.   Continue Toprol XL 50 mg daily, cardiac output good on RHC.  Will try to add spironolactone after cath.  Agree that we will need to be careful with SGLT2 inhibitor with recent episode of necrotizing fasciiitis.   Marca Ancona 03/27/2021 8:08 AM

## 2021-03-28 ENCOUNTER — Inpatient Hospital Stay: Payer: Self-pay | Admitting: Infectious Diseases

## 2021-03-28 ENCOUNTER — Encounter (HOSPITAL_COMMUNITY): Payer: Self-pay | Admitting: Cardiology

## 2021-03-28 ENCOUNTER — Inpatient Hospital Stay (HOSPITAL_COMMUNITY)
Admission: EM | Disposition: A | Discharge status: Home / Self Care | Payer: Self-pay | Source: Home / Self Care | Attending: Internal Medicine

## 2021-03-28 DIAGNOSIS — I5023 Acute on chronic systolic (congestive) heart failure: Secondary | ICD-10-CM

## 2021-03-28 HISTORY — PX: LEFT HEART CATH AND CORONARY ANGIOGRAPHY: CATH118249

## 2021-03-28 LAB — GLUCOSE, CAPILLARY
Glucose-Capillary: 164 mg/dL — ABNORMAL HIGH (ref 70–99)
Glucose-Capillary: 152 mg/dL — ABNORMAL HIGH (ref 70–99)
Glucose-Capillary: 134 mg/dL — ABNORMAL HIGH (ref 70–99)
Glucose-Capillary: 157 mg/dL — ABNORMAL HIGH (ref 70–99)

## 2021-03-28 LAB — BASIC METABOLIC PANEL
Anion gap: 7 (ref 5–15)
BUN: 17 mg/dL (ref 6–20)
CO2: 28 mmol/L (ref 22–32)
Calcium: 8.2 mg/dL — ABNORMAL LOW (ref 8.9–10.3)
Chloride: 97 mmol/L — ABNORMAL LOW (ref 98–111)
Creatinine, Ser: 2.01 mg/dL — ABNORMAL HIGH (ref 0.61–1.24)
GFR, Estimated: 40 mL/min — ABNORMAL LOW (ref >60)
Glucose, Bld: 182 mg/dL — ABNORMAL HIGH (ref 70–99)
Potassium: 3.7 mmol/L (ref 3.5–5.1)
Sodium: 132 mmol/L — ABNORMAL LOW (ref 135–145)

## 2021-03-28 LAB — CBC
HCT: 25.7 % — ABNORMAL LOW (ref 39.0–52.0)
Hemoglobin: 8.4 g/dL — ABNORMAL LOW (ref 13.0–17.0)
MCH: 29.5 pg (ref 26.0–34.0)
MCHC: 32.7 g/dL (ref 30.0–36.0)
MCV: 90.2 fL (ref 80.0–100.0)
Platelets: 234 10*3/uL (ref 150–400)
RBC: 2.85 MIL/uL — ABNORMAL LOW (ref 4.22–5.81)
RDW: 14.7 % (ref 11.5–15.5)
WBC: 7.1 10*3/uL (ref 4.0–10.5)
nRBC: 0 % (ref 0.0–0.2)

## 2021-03-28 LAB — MAGNESIUM: Magnesium: 2 mg/dL (ref 1.7–2.4)

## 2021-03-28 SURGERY — LEFT HEART CATH AND CORONARY ANGIOGRAPHY
Anesthesia: LOCAL

## 2021-03-28 MED ORDER — HEPARIN SODIUM (PORCINE) 1000 UNIT/ML IJ SOLN
INTRAMUSCULAR | Status: DC | PRN
  Administered 2021-03-28: 5000 [IU] via INTRAVENOUS

## 2021-03-28 MED ORDER — FENTANYL CITRATE (PF) 100 MCG/2ML IJ SOLN
INTRAMUSCULAR | Status: DC | PRN
  Administered 2021-03-28 (×2): 25 ug via INTRAVENOUS

## 2021-03-28 MED ORDER — HEPARIN (PORCINE) IN NACL 1000-0.9 UT/500ML-% IV SOLN
INTRAVENOUS | Status: AC
  Filled 2021-03-28: qty 1000

## 2021-03-28 MED ORDER — LABETALOL HCL 5 MG/ML IV SOLN: 10.0000 mg | INTRAVENOUS | Status: AC | PRN

## 2021-03-28 MED ORDER — ONDANSETRON HCL 4 MG/2ML IJ SOLN: 4.0000 mg | Freq: Four times a day (QID) | INTRAMUSCULAR | Status: DC | PRN

## 2021-03-28 MED ORDER — SODIUM CHLORIDE 0.9% FLUSH: 3.0000 mL | INTRAVENOUS | Status: DC | PRN

## 2021-03-28 MED ORDER — FENTANYL CITRATE (PF) 100 MCG/2ML IJ SOLN
INTRAMUSCULAR | Status: AC
  Filled 2021-03-28: qty 2

## 2021-03-28 MED ORDER — LIDOCAINE HCL (PF) 1 % IJ SOLN
INTRAMUSCULAR | Status: AC
  Filled 2021-03-28: qty 30

## 2021-03-28 MED ORDER — POTASSIUM CHLORIDE CRYS ER 20 MEQ PO TBCR: 40.0000 meq | EXTENDED_RELEASE_TABLET | Freq: Once | ORAL | Status: DC

## 2021-03-28 MED ORDER — LIDOCAINE HCL (PF) 1 % IJ SOLN
INTRAMUSCULAR | Status: DC | PRN
  Administered 2021-03-28: 5 mL via SUBCUTANEOUS

## 2021-03-28 MED ORDER — SODIUM CHLORIDE 0.9 % IV SOLN: INTRAVENOUS | Status: AC

## 2021-03-28 MED ORDER — POTASSIUM CHLORIDE CRYS ER 20 MEQ PO TBCR
40.0000 meq | EXTENDED_RELEASE_TABLET | Freq: Once | ORAL | Status: AC
  Administered 2021-03-28: 40 meq via ORAL
  Filled 2021-03-28: qty 2

## 2021-03-28 MED ORDER — SODIUM CHLORIDE 0.9 % IV SOLN: 250.0000 mL | INTRAVENOUS | Status: DC | PRN

## 2021-03-28 MED ORDER — MIDAZOLAM HCL 2 MG/2ML IJ SOLN
INTRAMUSCULAR | Status: AC
  Filled 2021-03-28: qty 2

## 2021-03-28 MED ORDER — ASPIRIN 81 MG PO CHEW
81.0000 mg | CHEWABLE_TABLET | ORAL | Status: AC
  Administered 2021-03-28: 81 mg via ORAL
  Filled 2021-03-28: qty 1

## 2021-03-28 MED ORDER — SPIRONOLACTONE 12.5 MG HALF TABLET
12.5000 mg | ORAL_TABLET | Freq: Every day | ORAL | Status: DC
  Administered 2021-03-28 – 2021-03-29 (×2): 12.5 mg via ORAL
  Filled 2021-03-28 (×2): qty 1

## 2021-03-28 MED ORDER — SODIUM CHLORIDE 0.9% FLUSH
3.0000 mL | Freq: Two times a day (BID) | INTRAVENOUS | Status: DC
  Administered 2021-03-28 – 2021-03-29 (×2): 3 mL via INTRAVENOUS

## 2021-03-28 MED ORDER — VERAPAMIL HCL 2.5 MG/ML IV SOLN
INTRAVENOUS | Status: AC
  Filled 2021-03-28: qty 2

## 2021-03-28 MED ORDER — IOHEXOL 350 MG/ML SOLN
INTRAVENOUS | Status: DC | PRN
  Administered 2021-03-28: 45 mL via INTRA_ARTERIAL

## 2021-03-28 MED ORDER — ASPIRIN EC 81 MG PO TBEC
81.0000 mg | DELAYED_RELEASE_TABLET | Freq: Every day | ORAL | Status: DC
  Administered 2021-03-29: 81 mg via ORAL
  Filled 2021-03-28: qty 1

## 2021-03-28 MED ORDER — TORSEMIDE 20 MG PO TABS
40.0000 mg | ORAL_TABLET | Freq: Every day | ORAL | Status: DC
  Administered 2021-03-29: 40 mg via ORAL
  Filled 2021-03-28: qty 2

## 2021-03-28 MED ORDER — ACETAMINOPHEN 325 MG PO TABS
650.0000 mg | ORAL_TABLET | ORAL | Status: DC | PRN
Start: 2021-03-28 — End: 2021-03-29

## 2021-03-28 MED ORDER — HEPARIN SODIUM (PORCINE) 1000 UNIT/ML IJ SOLN
INTRAMUSCULAR | Status: AC
  Filled 2021-03-28: qty 1

## 2021-03-28 MED ORDER — HYDRALAZINE HCL 20 MG/ML IJ SOLN: 10.0000 mg | INTRAMUSCULAR | Status: AC | PRN

## 2021-03-28 MED ORDER — ENOXAPARIN SODIUM 40 MG/0.4ML IJ SOSY: 40.0000 mg | PREFILLED_SYRINGE | INTRAMUSCULAR | Status: DC

## 2021-03-28 MED ORDER — VERAPAMIL HCL 2.5 MG/ML IV SOLN
INTRAVENOUS | Status: DC | PRN
  Administered 2021-03-28: 10 mL via INTRA_ARTERIAL

## 2021-03-28 MED ORDER — MIDAZOLAM HCL 2 MG/2ML IJ SOLN
INTRAMUSCULAR | Status: DC | PRN
  Administered 2021-03-28: 2 mg via INTRAVENOUS

## 2021-03-28 SURGICAL SUPPLY — 10 items
CATH 5FR JL3.5 JR4 ANG PIG MP (CATHETERS) ×2 IMPLANT
DEVICE RAD COMP TR BAND LRG (VASCULAR PRODUCTS) ×2 IMPLANT
GLIDESHEATH SLEND SS 6F .021 (SHEATH) ×2 IMPLANT
GUIDEWIRE INQWIRE 1.5J.035X260 (WIRE) ×1 IMPLANT
INQWIRE 1.5J .035X260CM (WIRE) ×2
KIT HEART LEFT (KITS) ×2 IMPLANT
PACK CARDIAC CATHETERIZATION (CUSTOM PROCEDURE TRAY) ×2 IMPLANT
SYR MEDRAD MARK 7 150ML (SYRINGE) ×2 IMPLANT
TRANSDUCER W/STOPCOCK (MISCELLANEOUS) ×2 IMPLANT
TUBING CIL FLEX 10 FLL-RA (TUBING) ×2 IMPLANT

## 2021-03-28 NOTE — Progress Notes (Signed)
Patient ID: Adam James, male   DOB: 1970-10-18, 50 y.o.   MRN: [id number]     Advanced Heart Failure Rounding Note  PCP-Cardiologist: Norman Herrlich, MD   Subjective:    No complaints this morning, breathing has improved.  Creatinine stable 2.01.  I/Os net negative -2115, weight down 6 lbs.   RHC, 08/31: RA mean 7, PCWP mean 21, PA 61%, Fick CO 9.33/CI 3.74  Objective:   Weight Range: 113.4 kg Body mass index is 31.26 kg/m.   Vital Signs:   Temp:  [98.3 F (36.8 C)-99 F (37.2 C)] 98.3 F (36.8 C) (09/02 0438) Pulse Rate:  [91-97] 91 (09/02 0438) Resp:  [17-18] 18 (09/02 0438) BP: (97-102)/(66-72) 101/66 (09/02 0438) SpO2:  [95 %-97 %] 95 % (09/02 0438) Weight:  [113.4 kg] 113.4 kg (09/02 0438) Last BM Date: 03/27/21  Weight change: Filed Weights   03/26/21 0959 03/27/21 0354 03/28/21 0438  Weight: 119.1 kg 116.1 kg 113.4 kg    Intake/Output:   Intake/Output Summary (Last 24 hours) at 03/28/2021 0810 Last data filed at 03/28/2021 0439 Gross per 24 hour  Intake 985.27 ml  Output 3100 ml  Net -2114.73 ml      Physical Exam    General: NAD Neck: JVP 8 cm, no thyromegaly or thyroid nodule.  Lungs: Clear to auscultation bilaterally with normal respiratory effort. CV: Nondisplaced PMI.  Heart regular S1/S2, no S3/S4, no murmur.  No peripheral edema.   Abdomen: Soft, nontender, no hepatosplenomegaly, no distention.  Skin: Intact without lesions or rashes.  Neurologic: Alert and oriented x 3.  Psych: Normal affect. Extremities: No clubbing or cyanosis. Left leg in boot, ulcers right foot.  HEENT: Normal.    Telemetry   NSR 90s, personally reviewed.   Labs    CBC Recent Labs    03/27/21 0336 03/28/21 0356  WBC 7.6 7.1  HGB 8.7* 8.4*  HCT 26.3* 25.7*  MCV 90.4 90.2  PLT 258 234   Basic Metabolic Panel Recent Labs    52/84/13 0336 03/28/21 0356  NA 134* 132*  K 4.0 3.7  CL 99 97*  CO2 27 28  GLUCOSE 212* 182*  BUN 17 17  CREATININE 2.07*  2.01*  CALCIUM 8.1* 8.2*  MG 1.7 2.0   Liver Function Tests No results for input(s): AST, ALT, ALKPHOS, BILITOT, PROT, ALBUMIN in the last 72 hours.  No results for input(s): LIPASE, AMYLASE in the last 72 hours. Cardiac Enzymes No results for input(s): CKTOTAL, CKMB, CKMBINDEX, TROPONINI in the last 72 hours.  BNP: BNP (last 3 results) Recent Labs    02/25/21 0149 03/23/21 0045  BNP 103.1* 1,129.9*    ProBNP (last 3 results) Recent Labs    10/07/20 1004  PROBNP 468*     D-Dimer No results for input(s): DDIMER in the last 72 hours. Hemoglobin A1C No results for input(s): HGBA1C in the last 72 hours. Fasting Lipid Panel No results for input(s): CHOL, HDL, LDLCALC, TRIG, CHOLHDL, LDLDIRECT in the last 72 hours. Thyroid Function Tests No results for input(s): TSH, T4TOTAL, T3FREE, THYROIDAB in the last 72 hours.  Invalid input(s): FREET3  Other results:   Imaging    No results found.   Medications:     Scheduled Medications:  amoxicillin-clavulanate  1 tablet Oral Q12H   [START ON 03/29/2021] aspirin EC  81 mg Oral Daily   atorvastatin  80 mg Oral Daily   Chlorhexidine Gluconate Cloth  6 each Topical Daily   doxycycline  100 mg Oral  Q12H   enoxaparin (LOVENOX) injection  40 mg Subcutaneous Daily   insulin aspart  0-15 Units Subcutaneous TID WC   insulin aspart  0-5 Units Subcutaneous QHS   insulin aspart  3 Units Subcutaneous TID WC   insulin glargine-yfgn  15 Units Subcutaneous QHS   melatonin  3 mg Oral QHS   metoprolol succinate  50 mg Oral Daily   pantoprazole  40 mg Oral BID AC   potassium chloride  40 mEq Oral Once   potassium chloride  40 mEq Oral Once   sodium chloride flush  3 mL Intravenous Q12H   sodium chloride flush  3 mL Intravenous Q12H   sucralfate  1 g Oral TID WC & HS    Infusions:  sodium chloride     sodium chloride     sodium chloride      PRN Medications: sodium chloride, sodium chloride, sodium chloride flush, sodium  chloride flush   Assessment/Plan   1. CAD: LHC in 2/22 with 50% dLM, 70% ramus, 70% pLCx, totally occluded proximal RCA, 90% stenosis ostial to mid LAD, 90% mid LAD, totally occluded distal LAD.  Cardiac MRI in 4/22 showed EF 40% with substantial viable myocardium, only delayed enhancement was subendocardial in the mid to apical inferior wall.  With this information, CABG had been planned in 8/22, but patient was admitted prior with left foot diabetic foot infection that progressed to necrotizing fasciitis.  Patient is now s/p extensive left lower leg debridement.  He has completed IV abx and is on po abx at this point.  Ideally, would have CABG.  However, need to have further healing of the left lower leg.  Also, will not be able to use veins from the left leg, and with ulceration on the right lower leg, I am not sure venous harvest from the right leg would be feasible (would likely need all arterial conduits).  - Continue ASA 81 daily.  - Continue atorvastatin 80 mg daily.  - As above, would ideally have CABG, but needs healing of wounds on legs first.   - Will need repeat coronary angiography prior to CABG per discussion with Dr. Cliffton Asters.  Creatinine stable today, will proceed with cath with as minimal contrast as possible.  Discussed risks/benefits with patient and he agrees.  2. Acute on chronic systolic CHF: With concern for low output HF.  Ischemic cardiomyopathy.  Initial echo in 2/22 with EF 40-45%.  Echo in 7/22 with EF 30-35%.  Echo in 8/22 with EF down to 25% with mild RV dysfunction and dilated IVC.  He was admitted this time with CHF exacerbation.  Co-ox off tunneled catheter (which was removed) was 54.8% on 8/29, suggesting at least borderline low output.  RHC 8/31 with preserved CO, mildly elevated PCWP, mild pulmonary hypertension.  Good diuresis again yesterday, weight down 6 more lbs.  Minimally volume overloaded now.  - Stop IV Lasix, will probably start torsemide 40 mg daily after  cath.  - Add spironolactone 12.5 daily after cath.  - Can continue Toprol XL 50 mg daily  - For now, no Entresto with elevated creatinine.  - Avoid SGLT2i with history of necrotizing fasciitis 3. CKD: Stage 3.  Creatinine is 2.01 today which may be around his baseline.  Will follow closely.  4. Hypomagnesemia/hypokalemia: - Replace K.  5. GI bleeding: History of erosive esophagitis earlier in 8/22.  He is now on Protonix.  Hgb 8.4, mild drop.  - Follow hgb closely.  6. Left leg  diabetic foot infection => necrotizing fasciitis: He has completed IV abx and is s/p debridement.  Now on doxycycline/Augmentin, will need to complete course.  7. RLE ulceration: Suspect venous stasis.  Stable.  8. DM2: SSI.  9. Mobilize with PT.   He should be able to go home soon.  Will need followup with [facility] as outpatient to determine timing of cath, will need healing of legs first.  He will need close followup in CHF clinic which I will arrange.    Length of Stay: 4  Marca Ancona, MD  03/28/2021, 8:10 AM  Advanced Heart Failure Team Pager [phone] (M-F; 7a - 5p)  Please contact [facility] for night-coverage after hours (5p -7a ) and weekends on [email or web address]

## 2021-03-28 NOTE — Progress Notes (Signed)
Coronary angiography done, minimal change from prior study in 2/22.  Will review with Dr. Cliffton Asters.  LVEDP 16.    Creatinine lower at 2.01 today, will hydrate post-cath and hold off on diuresis today.   I will start spironolactone 12.5 daily, and he can start back on torsemide 40 mg daily tomorrow.   From my standpoint, he can go home.  He will need close followup in CHF clinic and with [facility].  He is on po antibiotics now for prior necrotizing fasciitis and should follow with ID as well.  He will hopefully get to CABG when legs have healed more.   Adam James 03/28/2021 11:03 AM

## 2021-03-28 NOTE — Progress Notes (Signed)
PT Cancellation Note  Patient Details Name: Adam James MRN: [id number] DOB: Nov 23, 1970   Cancelled Treatment:    Reason Eval/Treat Not Completed: Patient declined, no reason specified;PT screened, no needs identified, will sign off. Pt voicing he has been independent in hospital without any acute functional mobility or balance deficits. Pt requesting no PT eval. PT will sign off per pt request. Coordinated with RN to re-consult if needed.   Raymond Gurney, PT, DPT Acute Rehabilitation Services  Pager: [phone] Office: [phone]    [name] 03/28/2021, 8:47 AM

## 2021-03-28 NOTE — H&P (View-Only) (Signed)
Patient ID: Adam James, male   DOB: 1970-10-18, 50 y.o.   MRN: [id number]     Advanced Heart Failure Rounding Note  PCP-Cardiologist: Norman Herrlich, MD   Subjective:    No complaints this morning, breathing has improved.  Creatinine stable 2.01.  I/Os net negative -2115, weight down 6 lbs.   RHC, 08/31: RA mean 7, PCWP mean 21, PA 61%, Fick CO 9.33/CI 3.74  Objective:   Weight Range: 113.4 kg Body mass index is 31.26 kg/m.   Vital Signs:   Temp:  [98.3 F (36.8 C)-99 F (37.2 C)] 98.3 F (36.8 C) (09/02 0438) Pulse Rate:  [91-97] 91 (09/02 0438) Resp:  [17-18] 18 (09/02 0438) BP: (97-102)/(66-72) 101/66 (09/02 0438) SpO2:  [95 %-97 %] 95 % (09/02 0438) Weight:  [113.4 kg] 113.4 kg (09/02 0438) Last BM Date: 03/27/21  Weight change: Filed Weights   03/26/21 0959 03/27/21 0354 03/28/21 0438  Weight: 119.1 kg 116.1 kg 113.4 kg    Intake/Output:   Intake/Output Summary (Last 24 hours) at 03/28/2021 0810 Last data filed at 03/28/2021 0439 Gross per 24 hour  Intake 985.27 ml  Output 3100 ml  Net -2114.73 ml      Physical Exam    General: NAD Neck: JVP 8 cm, no thyromegaly or thyroid nodule.  Lungs: Clear to auscultation bilaterally with normal respiratory effort. CV: Nondisplaced PMI.  Heart regular S1/S2, no S3/S4, no murmur.  No peripheral edema.   Abdomen: Soft, nontender, no hepatosplenomegaly, no distention.  Skin: Intact without lesions or rashes.  Neurologic: Alert and oriented x 3.  Psych: Normal affect. Extremities: No clubbing or cyanosis. Left leg in boot, ulcers right foot.  HEENT: Normal.    Telemetry   NSR 90s, personally reviewed.   Labs    CBC Recent Labs    03/27/21 0336 03/28/21 0356  WBC 7.6 7.1  HGB 8.7* 8.4*  HCT 26.3* 25.7*  MCV 90.4 90.2  PLT 258 234   Basic Metabolic Panel Recent Labs    52/84/13 0336 03/28/21 0356  NA 134* 132*  K 4.0 3.7  CL 99 97*  CO2 27 28  GLUCOSE 212* 182*  BUN 17 17  CREATININE 2.07*  2.01*  CALCIUM 8.1* 8.2*  MG 1.7 2.0   Liver Function Tests No results for input(s): AST, ALT, ALKPHOS, BILITOT, PROT, ALBUMIN in the last 72 hours.  No results for input(s): LIPASE, AMYLASE in the last 72 hours. Cardiac Enzymes No results for input(s): CKTOTAL, CKMB, CKMBINDEX, TROPONINI in the last 72 hours.  BNP: BNP (last 3 results) Recent Labs    02/25/21 0149 03/23/21 0045  BNP 103.1* 1,129.9*    ProBNP (last 3 results) Recent Labs    10/07/20 1004  PROBNP 468*     D-Dimer No results for input(s): DDIMER in the last 72 hours. Hemoglobin A1C No results for input(s): HGBA1C in the last 72 hours. Fasting Lipid Panel No results for input(s): CHOL, HDL, LDLCALC, TRIG, CHOLHDL, LDLDIRECT in the last 72 hours. Thyroid Function Tests No results for input(s): TSH, T4TOTAL, T3FREE, THYROIDAB in the last 72 hours.  Invalid input(s): FREET3  Other results:   Imaging    No results found.   Medications:     Scheduled Medications:  amoxicillin-clavulanate  1 tablet Oral Q12H   [START ON 03/29/2021] aspirin EC  81 mg Oral Daily   atorvastatin  80 mg Oral Daily   Chlorhexidine Gluconate Cloth  6 each Topical Daily   doxycycline  100 mg Oral  Q12H   enoxaparin (LOVENOX) injection  40 mg Subcutaneous Daily   insulin aspart  0-15 Units Subcutaneous TID WC   insulin aspart  0-5 Units Subcutaneous QHS   insulin aspart  3 Units Subcutaneous TID WC   insulin glargine-yfgn  15 Units Subcutaneous QHS   melatonin  3 mg Oral QHS   metoprolol succinate  50 mg Oral Daily   pantoprazole  40 mg Oral BID AC   potassium chloride  40 mEq Oral Once   potassium chloride  40 mEq Oral Once   sodium chloride flush  3 mL Intravenous Q12H   sodium chloride flush  3 mL Intravenous Q12H   sucralfate  1 g Oral TID WC & HS    Infusions:  sodium chloride     sodium chloride     sodium chloride      PRN Medications: sodium chloride, sodium chloride, sodium chloride flush, sodium  chloride flush   Assessment/Plan   1. CAD: LHC in 2/22 with 50% dLM, 70% ramus, 70% pLCx, totally occluded proximal RCA, 90% stenosis ostial to mid LAD, 90% mid LAD, totally occluded distal LAD.  Cardiac MRI in 4/22 showed EF 40% with substantial viable myocardium, only delayed enhancement was subendocardial in the mid to apical inferior wall.  With this information, CABG had been planned in 8/22, but patient was admitted prior with left foot diabetic foot infection that progressed to necrotizing fasciitis.  Patient is now s/p extensive left lower leg debridement.  He has completed IV abx and is on po abx at this point.  Ideally, would have CABG.  However, need to have further healing of the left lower leg.  Also, will not be able to use veins from the left leg, and with ulceration on the right lower leg, I am not sure venous harvest from the right leg would be feasible (would likely need all arterial conduits).  - Continue ASA 81 daily.  - Continue atorvastatin 80 mg daily.  - As above, would ideally have CABG, but needs healing of wounds on legs first.   - Will need repeat coronary angiography prior to CABG per discussion with Dr. Cliffton Asters.  Creatinine stable today, will proceed with cath with as minimal contrast as possible.  Discussed risks/benefits with patient and he agrees.  2. Acute on chronic systolic CHF: With concern for low output HF.  Ischemic cardiomyopathy.  Initial echo in 2/22 with EF 40-45%.  Echo in 7/22 with EF 30-35%.  Echo in 8/22 with EF down to 25% with mild RV dysfunction and dilated IVC.  He was admitted this time with CHF exacerbation.  Co-ox off tunneled catheter (which was removed) was 54.8% on 8/29, suggesting at least borderline low output.  RHC 8/31 with preserved CO, mildly elevated PCWP, mild pulmonary hypertension.  Good diuresis again yesterday, weight down 6 more lbs.  Minimally volume overloaded now.  - Stop IV Lasix, will probably start torsemide 40 mg daily after  cath.  - Add spironolactone 12.5 daily after cath.  - Can continue Toprol XL 50 mg daily  - For now, no Entresto with elevated creatinine.  - Avoid SGLT2i with history of necrotizing fasciitis 3. CKD: Stage 3.  Creatinine is 2.01 today which may be around his baseline.  Will follow closely.  4. Hypomagnesemia/hypokalemia: - Replace K.  5. GI bleeding: History of erosive esophagitis earlier in 8/22.  He is now on Protonix.  Hgb 8.4, mild drop.  - Follow hgb closely.  6. Left leg  diabetic foot infection => necrotizing fasciitis: He has completed IV abx and is s/p debridement.  Now on doxycycline/Augmentin, will need to complete course.  7. RLE ulceration: Suspect venous stasis.  Stable.  8. DM2: SSI.  9. Mobilize with PT.   He should be able to go home soon.  Will need followup with [facility] as outpatient to determine timing of cath, will need healing of legs first.  He will need close followup in CHF clinic which I will arrange.    Length of Stay: 4  Marca Ancona, MD  03/28/2021, 8:10 AM  Advanced Heart Failure Team Pager [phone] (M-F; 7a - 5p)  Please contact [facility] for night-coverage after hours (5p -7a ) and weekends on [email or web address]

## 2021-03-28 NOTE — Progress Notes (Signed)
TR band has beed removed, site is level zero, tegraderm applied to site.

## 2021-03-28 NOTE — Progress Notes (Signed)
HD#4 SUBJECTIVE:  Patient Summary: Adam James is a 50 y.o. with a pertinent PMH of T2DM, ischemic heart disease, chronic heart failure with reduced ejection fraction, CKD stage IIIA, and left diabetic foot infection with gas gangrene and necrotizing fasciitis, who presented with shortness of breath and admitted for acute on chronic HFrEF exacerbation.   Overnight Events: None.  Interim History: Adam James underwent coronary angiography this morning. He is medically stable for discharge however he faces a caregiver support barrier on discharge and will remain in hospital at this time.  OBJECTIVE:  Vital Signs: Vitals:   03/28/21 1011 03/28/21 1037 03/28/21 1113 03/28/21 1155  BP: 118/86 104/77 114/73 102/69  Pulse: 90     Resp: (!) 21     Temp:      TempSrc:      SpO2: 98%     Weight:      Height:       Supplemental O2: Room Air SpO2: 98 % O2 Flow Rate (L/min): 2 L/min  Filed Weights   03/26/21 0959 03/27/21 0354 03/28/21 0438  Weight: 119.1 kg 116.1 kg 113.4 kg     Intake/Output Summary (Last 24 hours) at 03/28/2021 1226 Last data filed at 03/28/2021 0913 Gross per 24 hour  Intake 360 ml  Output 3000 ml  Net -2640 ml   Net IO Since Admission: -7,574.19 mL [03/28/21 1226]  Physical Exam: Constitutional: Patient is resting comfortably in bed. No acute distress.  Cardio: Regular rate and rhythm, no murmurs/rubs/gallops Pulm: CTA bilaterally, no wheezes/crackles/rhonchi Abdomen: Soft, non-distended, non-tender MSK: RLE with mild but improved swelling. Patient is wearing fracture boot on RLE and LLE is covered with a sock with dressing between skin and sock. Neuro: No focal deficit noted. Alert and oriented to person, place, and time. Psych: Patient noted to have increased agitation through course of exam related to miscommunication. He is cooperative.  Patient Lines/Drains/Airways Status     Active Line/Drains/Airways     Name Placement date Placement time  Site Days   Peripheral IV 03/24/21 1.75" Left;Lateral Forearm 03/24/21  1843  Forearm  4   Peripheral IV 03/28/21 20 G 1.88" Left;Anterior;Proximal 03/28/21  5009  --  less than 1   Incision (Closed) 02/24/21 Leg Left 02/24/21  2326  -- 32   Wound / Incision (Open or Dehisced) 02/25/21 Diabetic ulcer Foot Right 02/25/21  0130  Foot  31             ASSESSMENT/PLAN:  Assessment: Principal Problem:   Acute on chronic heart failure (HCC) Active Problems:   Type 2 diabetes mellitus (HCC)   Gangrene of left foot (HCC)   Necrotizing fasciitis (HCC)   Diabetic polyneuropathy associated with type 2 diabetes mellitus (HCC)   NSTEMI (non-ST elevated myocardial infarction) (HCC)   Venous stasis ulcer of right calf limited to breakdown of skin without varicose veins (HCC)   Non-pressure chronic ulcer of other part of left foot limited to breakdown of skin (HCC)   Osteomyelitis (HCC)   Acute on chronic combined systolic and diastolic CHF (congestive heart failure) (HCC)   Plan: #Acute on chronic combined systolic and diastolic heart failure #CAD Patient underwent L coronary angiography today. Currently diuresing and is currently ~ -7.074L since admission, -9 kg since admission. He remains volume overloaded with trace bilateral LE edema though endorses decreased orthopnea. - Cardiology consulted, appreciate recommendations - Advanced heart failure team consulted, appreciate recommendations - Will hold lasix post-coronary angiography per heart failure team recommendations - Initiated  on spironolactone 12.5 mg daily - To restart torsemide 40 mg daily tomorrow 03/28/2021 - Fluid restriction of 1.5L/day - Strict I/Os and daily weights   #Anemia likely 2/2 to erosive esophagitis EGD was completed 03/12/2021 and showed severe erosive esophagitis and patient was started on PPI and Carafate at this time with recommendation for repeat EGD in 8 weeks. Patient denies hematuria, melena, hematochezia.  Last transfusion was 03/24/2021 with appropriate increase of Hb. Hb stable at 8.4 at this time. - Continue to monitor CBC - Transfuse if Hgb <7 - Consider GI consult if Hgb continues to decrease    #T2DM with diabetic neuropathy HbA1c 8.8%; goal BG of <180 to promote wound healing. BG has ranged 141-185 over past 24h. - Hold metformin due to AKI - 15 unites semglee at bedtime, novoLOG 8 units TID with meals, SSI - Continue CBG monitoring   #Acute kidney injury on chronic kidney disease, stage 3A, resolved Creatinine relatively unchanged at 2.01 (2.07 yesterday 03/26/2021). He continues to diurese well with net fluid status of -7.074L and roughly 9 kg weight loss. - Mg borderline stable at 2.0 after repletion yesterday 03/27/2021 - Continue following renal function - Advanced heart failure team consulted, appreciate recommendations - Will hold lasix post-coronary angiography per recommendations - Initiated on spironolactone 12.5 mg daily - To restart torsemide 40 mg daily tomorrow 03/28/2021 - Avoid nephrotoxic medications   #Hx of diabetic foot ulcer bilaterally #Hx of gas gangrene and necrotizing fasciitis of left foot Patient is s/p debridement and resection of the anterior lateral compartment of the L foot with ortho. He continues to be afebrile with stable vital signs and denies pain at this time. No leukocytosis.  - ID consulted, appreciate recommendations.  - Will continue doxycycline, Augmentin - Orho consulted, appreciate recommendations - Recommendations for wound dressing include compression socks for the RLE and sock without dressing between sock and wound for LLE as well as fracture boot on the L foot per their recommendations - He will need follow-up with ortho on discharge. - Wound care consulted - Follow CBC  Best Practice: Diet: Cardiac diet IVF: Fluids: None VTE: enoxaparin (LOVENOX) injection 40 mg Start: 03/23/21 1000 Code: Full AB: Doxycycline and  Augmentin DISPO: Anticipated discharge tomorrow to Home pending Decreased caregiver support.  Signature: Adam James, D.O.  Internal Medicine Resident, PGY-1 [facility]  Pager: [phone] 12:26 PM, 03/28/2021   Please contact the on call pager after 5 pm and on weekends at [phone].

## 2021-03-28 NOTE — Progress Notes (Signed)
OT Cancellation Note  Patient Details Name: Adam James MRN: [id number] DOB: 02/03/1971   Cancelled Treatment:    Reason Eval/Treat Not Completed: Patient at procedure or test/ unavailable Pt at cath lab this AM. Will follow-up again for OT eval as schedule permits.   Lorre Munroe 03/28/2021, 9:31 AM

## 2021-03-28 NOTE — Progress Notes (Signed)
Physical Therapy Discharge Patient Details Name: Adam James MRN: [id number] DOB: 1971-05-14 Today's Date: 03/28/2021 Time:  -     Patient discharged from PT services secondary to  pt continuing to refuse PT eval and voicing he has been independent with mobility while in the hospital without any acute changes in his balance or functional status .  Coordinated with RN to re-consult PT if they notice any balance or functional mobility concerns.  Progress and discharge plan discussed with patient and/or caregiver: Patient/Caregiver agrees with plan.  GP    Raymond Gurney, PT, DPT Acute Rehabilitation Services  Pager: [phone] Office: [phone]   [name] 03/28/2021, 8:43 AM

## 2021-03-28 NOTE — TOC CM/SW Note (Signed)
HF TOC CM received from [facility], rep Christy. States pt is active with [facility] RN. Will Isidoro Donning RN3 CCM, Heart Failure TOC CM [phone]

## 2021-03-28 NOTE — Interval H&P Note (Signed)
History and Physical Interval Note:  03/28/2021 9:44 AM  Adam James  has presented today for surgery, with the diagnosis of CAD.  The various methods of treatment have been discussed with the patient and family. After consideration of risks, benefits and other options for treatment, the patient has consented to  Procedure(s): LEFT HEART CATH AND CORONARY ANGIOGRAPHY (N/A) as a surgical intervention.  The patient's history has been reviewed, patient examined, no change in status, stable for surgery.  I have reviewed the patient's chart and labs.  Questions were answered to the patient's satisfaction.     Delicia Berens [name]

## 2021-03-28 NOTE — Progress Notes (Signed)
Pt stated he wants to get a pulled pork sandwich as soon as he is discharged from the hospital. Pt also admits he drinks atleast 2.5 liters of fluid per day at home. RN educated pt regarding sodium and fluid restrictions. CHF booklet given to patient.

## 2021-03-29 LAB — BASIC METABOLIC PANEL
Anion gap: 8 (ref 5–15)
BUN: 17 mg/dL (ref 6–20)
CO2: 29 mmol/L (ref 22–32)
Calcium: 8.2 mg/dL — ABNORMAL LOW (ref 8.9–10.3)
Chloride: 97 mmol/L — ABNORMAL LOW (ref 98–111)
Creatinine, Ser: 1.96 mg/dL — ABNORMAL HIGH (ref 0.61–1.24)
GFR, Estimated: 41 mL/min — ABNORMAL LOW (ref >60)
Glucose, Bld: 250 mg/dL — ABNORMAL HIGH (ref 70–99)
Potassium: 4.2 mmol/L (ref 3.5–5.1)
Sodium: 134 mmol/L — ABNORMAL LOW (ref 135–145)

## 2021-03-29 LAB — CBC
HCT: 27.5 % — ABNORMAL LOW (ref 39.0–52.0)
Hemoglobin: 8.9 g/dL — ABNORMAL LOW (ref 13.0–17.0)
MCH: 29.3 pg (ref 26.0–34.0)
MCHC: 32.4 g/dL (ref 30.0–36.0)
MCV: 90.5 fL (ref 80.0–100.0)
Platelets: 246 10*3/uL (ref 150–400)
RBC: 3.04 MIL/uL — ABNORMAL LOW (ref 4.22–5.81)
RDW: 14.6 % (ref 11.5–15.5)
WBC: 6.9 10*3/uL (ref 4.0–10.5)
nRBC: 0 % (ref 0.0–0.2)

## 2021-03-29 LAB — GLUCOSE, CAPILLARY
Glucose-Capillary: 203 mg/dL — ABNORMAL HIGH (ref 70–99)
Glucose-Capillary: 119 mg/dL — ABNORMAL HIGH (ref 70–99)

## 2021-03-29 MED ORDER — SPIRONOLACTONE 25 MG PO TABS: 12.5000 mg | ORAL_TABLET | Freq: Every day | ORAL | 1 refills | Status: DC

## 2021-03-29 MED ORDER — TORSEMIDE 40 MG PO TABS: 40.0000 mg | ORAL_TABLET | Freq: Every day | ORAL | 1 refills | Status: DC

## 2021-03-29 MED FILL — Heparin Sod (Porcine)-NaCl IV Soln 1000 Unit/500ML-0.9%: INTRAVENOUS | Qty: 1000 | Status: AC

## 2021-03-29 NOTE — Discharge Summary (Signed)
Name: Adam James MRN: [id number] DOB: 06/03/1971 50 y.o. PCP: Patient, No Pcp Per (Inactive)  Date of Admission: 03/22/2021 11:08 PM Date of Discharge:  03/29/2021 Attending Physician: Dr. Oswaldo Done  DISCHARGE DIAGNOSIS:  Primary Problem: Acute on chronic heart failure [facility])   Hospital Problems: Principal Problem:   Acute on chronic heart failure (HCC) Active Problems:   Type 2 diabetes mellitus (HCC)   Gangrene of left foot (HCC)   Necrotizing fasciitis (HCC)   Diabetic polyneuropathy associated with type 2 diabetes mellitus (HCC)   NSTEMI (non-ST elevated myocardial infarction) (HCC)   Venous stasis ulcer of right calf limited to breakdown of skin without varicose veins (HCC)   Non-pressure chronic ulcer of other part of left foot limited to breakdown of skin (HCC)   Osteomyelitis (HCC)   Acute on chronic combined systolic and diastolic CHF (congestive heart failure) (HCC)    DISCHARGE MEDICATIONS:   Allergies as of 03/29/2021       Reactions   Morphine Itching        Medication List     STOP taking these medications    furosemide 40 MG tablet Commonly known as: LASIX   irbesartan 75 MG tablet Commonly known as: AVAPRO       TAKE these medications    amLODipine 5 MG tablet Commonly known as: NORVASC Take 1 tablet (5 mg total) by mouth daily.   amoxicillin-clavulanate 875-125 MG tablet Commonly known as: Augmentin Take 1 tablet by mouth 2 (two) times daily.   aspirin EC 81 MG tablet Take 1 tablet (81 mg total) by mouth daily. Swallow whole.   atorvastatin 80 MG tablet Commonly known as: LIPITOR Take 1 tablet (80 mg total) by mouth daily.   doxycycline 100 MG tablet Commonly known as: VIBRA-TABS Take 1 tablet (100 mg total) by mouth 2 (two) times daily.   glyBURIDE 5 MG tablet Commonly known as: DIABETA Take 5 mg by mouth 2 (two) times daily.   metFORMIN 500 MG tablet Commonly known as: GLUCOPHAGE Take 500 mg by mouth 2 (two) times  daily.   metoprolol succinate 50 MG 24 hr tablet Commonly known as: TOPROL-XL Take 1 tablet (50 mg total) by mouth daily. Take with or immediately following a meal.   pantoprazole 40 MG tablet Commonly known as: PROTONIX Take 1 tablet (40 mg total) by mouth 2 (two) times daily before a meal.   silver sulfADIAZINE 1 % cream Commonly known as: SILVADENE Apply 1 application topically daily.   spironolactone 25 MG tablet Commonly known as: ALDACTONE Take 0.5 tablets (12.5 mg total) by mouth daily. Start taking on: March 30, 2021   sucralfate 1 GM/10ML suspension Commonly known as: CARAFATE Take 10 mLs (1 g total) by mouth 4 (four) times daily -  with meals and at bedtime for 12 days.   Torsemide 40 MG Tabs Take 40 mg by mouth daily. Start taking on: March 30, 2021        DISPOSITION AND FOLLOW-UP:  [name] was discharged from [facility] in Stable condition. At the hospital follow up visit please address:  #Acute on chronic HFrEF exacerbation Patient counseled on low salt diet. He will discharge with new medications of spironolactone 12.5 mg daily and torsemide 40 mg. He has a follow-up with cardiology on 04/09/2021.  #Elevated troponin 2/2 demand ischemia #Hx of CAD Patient has multi-vessel disease as evidenced by RHC, LHC. Troponin elevations secondary to demand ischemia as a result of acute on chronic HFrEF exacerbation.  Follow-up with cardiology on 04/09/2021.  #Anemia 2/2 erosive esophagitis Hgb has remained stable since PRBC transfusion on 03/24/2021. No evidence of recurrent bleed at this time. GI recommendation at time of this finding was for repeat EGD in 6-8 weeks.  #T2DM with diabetic neuropathy  Chronically managed.  #Acute kidney injury on chronic Kidney diease stage 3a Cr bump after initiation of diuresis with lasix but has trended down throughout admission. GFR relatively unchanged, 41 on discharge. Will need repeat BMP to  monitor improvement of renal function.  #Hx of diabetic foot ulcer #Hx of gas gangrene and necrotizing fascitis of left foot Had tunneled cath removed by IR. Ortho evaluated this patient and recommended compression socks for his right lower extremity and a sock to be worn on his left lower extremity with no dressing between the wound and the sock and to continue with fracture boot on the left. No significant changes in wounds noted throughout admission. Continue home antibiotics through 04/18/2021 recommended by ID: doxycycline and Augmentin.  Follow-up Recommendations: Consults: Infectious disease, orthopedic surgery Labs: Basic Metabolic Profile and CBC Studies: None Medications: Spironolactone 12.5 mg daily and torsemide 40 mg daily.  Follow-up Appointments:  Follow-up Information     [facility] Follow up on 04/09/2021.   Specialty: Cardiology Why: Advanced Heart Failure Clinic at [facility] 2 pm Entrance C, Garage Code 4455 Contact information: [address] [id number] mc [place] [address] [phone]        [facility] Follow up.   Why: Home Health RN-will contact you to arrange visit #[phone]                HOSPITAL COURSE:  Patient Summary: Adam James is a 50 y.o. 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   #Acute on Chronic HFrEF exacerbation  Last LVEF 30-35% on 02/20/21. Patient has not been adherent to fluid restriction or a low salt diet presenting with SOB, orthopnea, lower extremity edema and a 6-7 lbs weight gain over the last several days prior to admission. His breathing remained stable and he remains on room air,  however, he continues to have dyspnea on exertion, orthopnea, and lower extremity edema. Diuresed with 80mg  IV lasix on admission, with no significant urine output (~775 cc recorded). Had bump in Cr after diuresis, and had diuresis held on 8/29. Repeat echo on 8/28 with LVEF of 25%, LV demonstrating global hypokinesis, and LV diastolic parameters consistent with grade II diastolic dysfunction. Restarted on high dose diuresis with 100mg  IV lasix TID and metolazone. Had 3.7L urine output after 1 day of diuresis, although no significant changes in weight noted. He continued to endorse daily improvement in breathing and decreased orthopnea as well as LE edema improvements. Lasix 100 mg IV BID was continued with 3.225L urine output and 4.1 kg decrease in weight on 08/31, 3.1L and 3kg decrease in weight on 09/01, until 09/02 when it was held after completion of LHC. At this time patient was initiated on spironolactone 12.5 mg daily. He continued to have 1.15L urine output and 2.4 kg decrease in weight on 09/02, 1.15L urine output and no significant decrease in weight to this point on 09/03. On day of discharge he initiated torsemide 40 mg  daily and will continue this regimen at home.   #Elevated troponin 2/2 demand ischemia #Hx of CAD Cath 08/2020 showed severe multivessel coronary obstructive disease with smooth 50% distal left main stenosis prior to trifurcating into the LAD, ramus intermediate, and left circumflex vessel. High sensitivity troponins elevated on admission although the patient remained chest pain free. Cardiology consulted and believed troponin elevation was likely secondary to demand ischemia. Heparin was not recommended given recent severe GI bleed requiring transfusion. Patient did have a Hgb drop 08/31 to 6.9 and was transfused with 1u PRBC. Hgb remained stable since. RHC performed on 8/31 and showed preserved cardiac output, mildly elevated PCWP, and mild pulmonary venous hypertension. LHC  performed on 09/02 showed Ost LD to Mid LAD 90% stenosed, mid LAD to Dist LAD 90% stenosed, dist LAD lesion 100% stenosed.prox Cx 70% stenosed, prox RCA to Mid RCA 100% stenosed, dist RCA 100% stenosed, ramus 70% stenosed, 1st Mrg 50% stenosed, mid LM to Dist LM 60% stenosed.   #Anemia likely 2/2 to erosive esophagitis Patient intermittently required RBC transfusions during the course of his last hospitalization. EGD performed showed severe erosive esophagitis and patient was started on PPI and carafate and recommended to have repeat EGD in 8 weeks. Had Hgb drop to 6.9 on 8/29 and received 1u PRBC. Hgb has remained stable since transfusion with no hematochezia, melena, blood in urine.  #T2DM with diabetic neuropathy  HbA1c 8.8%.Goal blood sugar <180 to promote wound healing. Metformin held on admission secondary to AKI. Received 15u semglee bedtime, novoLOG 8u tid with meals, and SSI.  #Acute kidney injury on chronic Kidney diease stage 3a Patient was noted to have a bump in Cr to 2.17 on 8/29 from 1.9 08/28 with  previous baseline around 1.6. Attributed this to volume overload in setting of acute on chronic HFrEF exacerbation, though of note this Cr bump occurred with lasix use on 8/28. Cr slightly improved 2.06 on 8/30 and continued to improve daily with 2.05 08/31 > 2.07 09/01 > 2.01 09/02 > 1.96 09/03. GFR remained relatively unchanged throughout admission with GFR of 41 on day of discharge.  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 He has remained afebrile, with stable vitals and no leukocytosis throughout admission. ID followed this patient again and recommended continuation of his home oral antibiotics of doxycycline and Augmentin at this time, although he did receive IV unasyn on 8/28. Had tunneled cath removed by IR as he is no longer receiving IV  antibiotics. Ortho evaluated this patient and recommended compression socks for his right lower extremity and a sock to be worn on his left lower extremity with no dressing between the wound and the sock and to continue with fracture boot on the left. This was done intermittently with patient preferring/insisting on wound dressing to be placed between sock and skin. No significant changes in wounds noted throughout admission.    DISCHARGE INSTRUCTIONS:    SUBJECTIVE:  [name] was seen and evaluated at bedside on day of discharge. He reports feeling generally well with no pain and improvement of breathing and edema. He is able to ambulate around his room without trouble breathing. He is comfortable with dressing changes of his LE wounds at home.  Discharge Vitals:   BP 113/66 (BP Location: Left Arm)   Pulse 96   Temp 97.6 F (36.4 C) (Oral)  Resp 18   Ht 6\' 3"  (1.905 m)   Wt 113.5 kg   SpO2 96%   BMI 31.29 kg/m   OBJECTIVE:  Physical Exam: Constitutional: Patient is resting comfortably in bed watching television. No acute distress. Cardio: Regular rate and rhythm, no murmurs/rubs/gallops Pulm: Clear to auscultation bilaterally, no wheezes/crackles/rhonchi Abdomen: Soft, non-tender, non-distended MSK: RLE edema improved, 1+ non-pitting. Patient is wearing fracture boot with wound dressings between skin and boot on RLE and LLE has sock covering with wound dressing between sock and skin. Non-tender. Neuro: No focal deficit noted. Alert and oriented to person, place, time. Psych: Normal mood and affect. Cooperative.  Pertinent Labs, Studies, and Procedures:  CBC Latest Ref Rng & Units 03/29/2021 03/28/2021 03/27/2021  WBC 4.0 - 10.5 K/uL 6.9 7.1 7.6  Hemoglobin 13.0 - 17.0 g/dL 05/27/2021) 5.9(D) 6.3(O)  Hematocrit 39.0 - 52.0 % 27.5(L) 25.7(L) 26.3(L)  Platelets 150 - 400 K/uL 246 234 258    CMP Latest Ref Rng & Units 03/29/2021 03/28/2021 03/27/2021  Glucose 70 - 99 mg/dL 05/27/2021) 433(I)  951(O)  BUN 6 - 20 mg/dL 17 17 17   Creatinine 0.61 - 1.24 mg/dL 841(Y) ) 6.06(T)  Sodium 135 - 145 mmol/L 134(L) 132(L) 134(L)  Potassium 3.5 - 5.1 mmol/L 4.2 3.7 4.0  Chloride 98 - 111 mmol/L 97(L) 97(L) 99  CO2 22 - 32 mmol/L 29 28 27   Calcium 8.9 - 10.3 mg/dL 8.2(L) 8.2(L) 8.1(L)  Total Protein 6.5 - 8.1 g/dL - - -  Total Bilirubin 0.3 - 1.2 mg/dL - - -  Alkaline Phos 38 - 126 U/L - - -  AST 15 - 41 U/L - - -  ALT 0 - 44 U/L - - -    DG Chest 2 View  Result Date: 03/22/2021 CLINICAL DATA:  Shortness of breath. EXAM: CHEST - 2 VIEW COMPARISON:  Chest radiograph dated 02/22/2019 FINDINGS: Right-sided central venous line with tip at the cavoatrial junction. Diffuse interstitial prominence may represent edema or atypical pneumonia. No lobar consolidation. Probable small bilateral pleural effusions. No pneumothorax. The cardiac silhouette is within limits. No acute osseous pathology. IMPRESSION: Small bilateral pleural effusions and interstitial edema. Pneumonia is not excluded. Electronically Signed   By: M.D.   On: 03/22/2021 23:51     Signed: 02/24/2019, D.O.  Internal Medicine Resident, PGY-1 [facility]  Pager: [phone] 10:33 AM, 03/29/2021

## 2021-03-29 NOTE — Consult Note (Signed)
WOC Nurse Consult Note: Reason for Consult: Left LE guidance for wound care request. Patient was seen by Dr. Lajoyce Corners this week (03/25/21) and he has provided stockings for topical care of the wounds and guidance for their application. He has also provided an orthopedic shoe to assist in offloading the ulcer on the foot.  Follow up per Dr. Audrie Lia instructions.  I have communicated with the patient's Bedside RN today, Laury Deep via Secure Chat.  WOC nursing team will not follow, but will remain available to this patient, the nursing and medical teams.  Please re-consult if needed. Thanks, Ladona Mow, MSN, RN, GNP, [facility]  Pager# [phone]

## 2021-03-29 NOTE — Progress Notes (Signed)
Progress Note  Patient Name: Adam James Date of Encounter: 03/29/2021  [facility] Cardiologist: Norman Herrlich, MD , Dr. Shirlee Latch with CHF team  Subjective   Sleeping, easily arousable.  Feeling better.  No chest pain or shortness of breath.  Laying comfortably without any increased respiratory effort.  Inpatient Medications    Scheduled Meds:  amoxicillin-clavulanate  1 tablet Oral Q12H   aspirin EC  81 mg Oral Daily   atorvastatin  80 mg Oral Daily   Chlorhexidine Gluconate Cloth  6 each Topical Daily   doxycycline  100 mg Oral Q12H   enoxaparin (LOVENOX) injection  40 mg Subcutaneous Daily   insulin aspart  0-15 Units Subcutaneous TID WC   insulin aspart  0-5 Units Subcutaneous QHS   insulin aspart  3 Units Subcutaneous TID WC   insulin glargine-yfgn  15 Units Subcutaneous QHS   melatonin  3 mg Oral QHS   metoprolol succinate  50 mg Oral Daily   pantoprazole  40 mg Oral BID AC   sodium chloride flush  3 mL Intravenous Q12H   sodium chloride flush  3 mL Intravenous Q12H   sodium chloride flush  3 mL Intravenous Q12H   spironolactone  12.5 mg Oral Daily   sucralfate  1 g Oral TID WC & HS   torsemide  40 mg Oral Daily   Continuous Infusions:  sodium chloride     sodium chloride     PRN Meds: sodium chloride, sodium chloride, acetaminophen, ondansetron (ZOFRAN) IV, sodium chloride flush, sodium chloride flush   Vital Signs    Vitals:   03/28/21 1244 03/28/21 1337 03/28/21 1956 03/29/21 0452  BP: 95/60 96/70 111/73 113/66  Pulse:   93 96  Resp: 16  18 18   Temp:   98.9 F (37.2 C) 97.6 F (36.4 C)  TempSrc:   Oral Oral  SpO2:   96% 96%  Weight:    113.5 kg  Height:        Intake/Output Summary (Last 24 hours) at 03/29/2021 0835 Last data filed at 03/29/2021 0817 Gross per 24 hour  Intake 1193 ml  Output 1150 ml  Net 43 ml   Last 3 Weights 03/29/2021 03/28/2021 03/27/2021  Weight (lbs) 250 lb 4.8 oz 250 lb 1.6 oz 256 lb  Weight (kg) 113.535 kg 113.445 kg  116.121 kg      Telemetry    No adverse arrhythmias- Personally Reviewed  ECG    No new- Personally Reviewed  Physical Exam   GEN: No acute distress.   Neck: No JVD Cardiac: RRR, no murmurs, rubs, or gallops.  Respiratory: Clear to auscultation bilaterally. GI: Soft, nontender, non-distended  MS: No edema; No deformity. Left leg boot, ulcers right foot Neuro:  Nonfocal  Psych: Normal affect   Labs    High Sensitivity Troponin:   Recent Labs  Lab 03/23/21 0044 03/23/21 0113 03/23/21 0451 03/23/21 1402 03/24/21 1828  TROPONINIHS 234* 280* 361* 319* 220*      Chemistry Recent Labs  Lab 03/24/21 0540 03/24/21 1828 03/27/21 0336 03/28/21 0356 03/29/21 0201  NA 136   < > 134* 132* 134*  K 3.9   < > 4.0 3.7 4.2  CL 103   < > 99 97* 97*  CO2 26   < > 27 28 29   GLUCOSE 198*   < > 212* 182* 250*  BUN 19   < > 17 17 17   CREATININE 2.17*   < > 2.07* 2.01* 1.96*  CALCIUM 7.9*   < >  8.1* 8.2* 8.2*  PROT 5.5*  --   --   --   --   ALBUMIN 1.7*  --   --   --   --   AST 11*  --   --   --   --   ALT 11  --   --   --   --   ALKPHOS 55  --   --   --   --   BILITOT 0.7  --   --   --   --   GFRNONAA 37*   < > 39* 40* 41*  ANIONGAP 7   < > 8 7 8    < > = values in this interval not displayed.     Hematology Recent Labs  Lab 03/27/21 0336 03/28/21 0356 03/29/21 0201  WBC 7.6 7.1 6.9  RBC 2.91* 2.85* 3.04*  HGB 8.7* 8.4* 8.9*  HCT 26.3* 25.7* 27.5*  MCV 90.4 90.2 90.5  MCH 29.9 29.5 29.3  MCHC 33.1 32.7 32.4  RDW 14.8 14.7 14.6  PLT 258 234 246    BNP Recent Labs  Lab 03/23/21 0045  BNP 1,129.9*     DDimer No results for input(s): DDIMER in the last 168 hours.   Radiology    CARDIAC CATHETERIZATION  Result Date: 03/28/2021   Ost LAD to Mid LAD lesion is 90% stenosed.   Mid LAD to Dist LAD lesion is 90% stenosed.   Dist LAD lesion is 100% stenosed.   Prox Cx lesion is 70% stenosed.   Prox RCA to Mid RCA lesion is 100% stenosed.   Dist RCA lesion is 100%  stenosed.   Ramus lesion is 70% stenosed.   1st Mrg lesion is 50% stenosed.   Mid LM to Dist LM lesion is 60% stenosed. Coronary angiography looks very similar to prior study in February.  Will review with Dr. March, plan for eventual CABG when leg lesions have healed.   Diagnostic Dominance: Right  ECHO 03/24/21:   1. Left ventricular ejection fraction, by estimation, is 25%. The left  ventricle has severely decreased function. The left ventricle demonstrates  global hypokinesis. The left ventricular internal cavity size was mildly  dilated. Left ventricular  diastolic parameters are consistent with Grade II diastolic dysfunction  (pseudonormalization).   2. Right ventricular systolic function is mildly reduced. The right  ventricular size is normal. The estimated right ventricular systolic  pressure is 42.7 mmHg.   3. Left atrial size was moderately dilated.   4. The mitral valve is normal in structure. Mild mitral valve  regurgitation. No evidence of mitral stenosis.   5. The aortic valve is tricuspid. Aortic valve regurgitation is mild. No  aortic stenosis is present.   6. The inferior vena cava is dilated in size with <50% respiratory  variability, suggesting right atrial pressure of 15 mmHg.   7. There is a left-sided pleural effusion.  Cardiac Studies   As above  Patient Profile     50 y.o. male with significant coronary artery disease, acute on chronic systolic heart failure chronic kidney disease stage III and left leg diabetic foot infection with necrotizing fasciitis  Assessment & Plan    Coronary artery disease - Left heart catheterization provided yesterday by Dr. 52 on 03/28/2021.  Triple-vessel disease noted.  Ideal would be for CABG with Dr. 05/28/2021 once infection/necrotizing fasciitis resolved.  Patient understands.  Dr. Cliffton Asters with cardiothoracic surgery aware. -Aspirin 81 mg, atorvastatin 80 mg high intensity dose.  Acute on  chronic systolic heart  failure - Starting torsemide 40 mg daily - Spironolactone 12.5 mg - Toprol-XL 50 mg daily - No Entresto given elevated creatinine. - Avoiding SGLT2 inhibitor with history of necrotizing fasciitis. -Creatinine slightly elevated today however in general range as before.  Would be reasonable for discharge.  Left ventricular end-diastolic pressure 16.  Overall euvolemic.  Chronic kidney disease stage IIIa - Creatinine 1.96 fairly stable.  Avoid NSAIDs.  Avoiding Entresto at this time.  Chronic anemia - Hemoglobin 8.4-8.9.  Stable.  No active signs of bleeding.  Left leg diabetic foot infection - Necrotizing fasciitis.  Completed IV antibiotics status post debridement.  On doxycycline and Augmentin.  He will need to complete this course.  In review of Dr. Alford Highland last progress note, he is okay for discharge from his perspective.     For questions or updates, please contact [facility] Please consult [email or web address] for contact info under        Signed, Donato Schultz, MD  03/29/2021, 8:35 AM

## 2021-03-29 NOTE — Progress Notes (Addendum)
  Pt refused to have compression socks place prior to been discharge.    Pt stated that he wants specific dressing change even though he does not have orders for a dressing change.   Nurse offered to get orders  in place. However pt yelled at nurse and stated: "just take this out of my hand to get the hell out and will talk to my doctor about it".   Nurse attempted to place a dressing upon removing peripheral ivs . And patient refused.   Charge RN aware of pt statements.   Pt was taken by a wheelchair by nurse tech. Pt was refusing his AVS. However this nurse was able to hand to him and pt yelled once more to nurse and aide.   Other nurses present during pts episode.

## 2021-03-29 NOTE — Progress Notes (Signed)
Pt refused to have compression socks placed today. Pt stated that the compression socks give him blisters.  Pt has fracture boot on the left foot in place.

## 2021-03-29 NOTE — Progress Notes (Signed)
Pt is ready to be D/C today. Contacted Christy with [facility] and left a VM regarding D/C order and wound care supplies.

## 2021-03-29 NOTE — Progress Notes (Addendum)
Assisted pt with his prescriptions through the [facility] program. Notified Christy with [facility] that pt doesn't have orders for wound care but he needs to continue using the stocking.

## 2021-03-29 NOTE — Progress Notes (Signed)
Cardiac Rehab [phone] On arrival pt c/o of being sleepy and tired. States that he didn't sleep well last night. Completed CHF education with pt. We discussed daily weights, signs and symptoms of heart failure, zones, when to call MD and 911, Low sodium diet and exercise as tolerated. He voices understanding.

## 2021-03-29 NOTE — Progress Notes (Signed)
OT Cancellation Note  Patient Details Name: Chaska Hagger MRN: [id number] DOB: Aug 05, 1970   Cancelled Treatment:    Reason Eval/Treat Not Completed: Other (comment) (pt dcing the buiding at current attempt) Alphia Moh OTR/L  Acute Rehab Services  [phone] office number [phone] pager number  Alphia Moh 03/29/2021, 1:24 PM

## 2021-03-31 NOTE — Brief Op Note (Signed)
[id number] 

## 2021-03-31 NOTE — Op Note (Signed)
Adam James, Adam James MEDICAL RECORD NO: [id number] ACCOUNT NO: [id number] DATE OF BIRTH: [birth year] FACILITY: [facility] LOCATION: [facility] PHYSICIAN: Doralee Albino. Carola Frost, MD  Operative Report   DATE OF PROCEDURE: 02/24/2021  PREOPERATIVE DIAGNOSES:  Left foot gas gangrene, second left distal leg infection.  POSTOPERATIVE DIAGNOSES:  Left foot gas gangrene, second left distal leg infection.  PROCEDURES: 1.  Incision and drainage of gas gangrene and multiple infected areas within the left foot. 2.  Incision and drainage of left ankle and leg infection. 3.  Application of short leg splint.  SURGEON:  Jovanie Verge. Carola Frost, MD  ASSISTANT:  None.  ANESTHESIA:  General.  COMPLICATIONS:  None.  TOURNIQUET:  None.  DISPOSITION:  To PACU.  CONDITION:  Stable.  SPECIMENS:  Multiple anaerobic, aerobic, and fungal.  BRIEF SUMMARY OF INDICATION FOR PROCEDURE:  The patient is a 50 year old with severe uncontrolled diabetes and vasculopathy including severe coronary artery disease for which he was initially scheduled for a coronary artery bypass procedure.  He has been  followed at the outpatient wound care center and reportedly discharged from there, but with ulcers that persisted and ultimately resulted in a deep infection.  He has had pain, redness extending from the foot up the leg for the last 2 days.  I discussed  with him the findings of soft tissue gas on his plain films and the need for immediate debridement.  I also explained that my colleague, Dr. Aldean Baker because of his expertise and experience in this area would likely assume care for subsequent  procedures and at least a second look within the next 48-72 hours depending on the findings.  Risks included heart attack, stroke, persistence of infection, eventual need for amputation and multiple others.  He acknowledged these risks and did wish to  proceed.  DESCRIPTION OF PROCEDURE:  The patient was taken to the operating room where general  anesthesia was induced.  A chlorhexidine wash and Betadine scrub and paint were performed of the left lower extremity.  I began with a longitudinal incision over the  area of most induration after a timeout.  Dissection was carried deep to the fascia where multiple areas of gross infection were identified.  Of note, I also paired the ulcer on the plantar aspect of the forefoot and removed quite a bit of tissue there  to find that the ulcer bed was considerably larger than would be anticipated from the external appearance.  Muscle fascia and some tendon was removed in the infected area.  It was copiously irrigated and that irrigation was supplemented with  chlorhexidine soap.  I then went into the leg with fresh instruments and made an additional longitudinal incision over the anterior compartment.  I identified only a small amount of infection within the tendon sheath deep to the fascia and this was  irrigated thoroughly; however, I did not identify large areas of necrosis nor gross infection and the tissue did not have a normal or completely healthy appearance.  Wounds were left open and packed.  Additionally, in order to help control soft tissue  swelling, infection, a posterior stirrup splint was applied.  Mepilex gauze dressing was placed over the wounds and the patient was taken to the PACU in stable condition.  The patient was administered antibiotics as soon as cultures have been obtained.  The patient remains at high risk for amputation.  He will undergo MRI to better characterize the severity and extent of his infection.  Serial procedures should be anticipated and risk  remain elevated systemically as well.   NIK D: 03/31/2021 8:44:56 am T: 03/31/2021 9:42:00 am  JOB: [id number]/ [id number]

## 2021-04-09 ENCOUNTER — Encounter (HOSPITAL_COMMUNITY): Payer: Medicaid Other

## 2021-04-21 ENCOUNTER — Ambulatory Visit (INDEPENDENT_AMBULATORY_CARE_PROVIDER_SITE_OTHER): Payer: Self-pay | Admitting: Orthopedic Surgery

## 2021-04-21 ENCOUNTER — Encounter: Payer: Self-pay | Admitting: Orthopedic Surgery

## 2021-04-21 ENCOUNTER — Other Ambulatory Visit: Payer: Self-pay

## 2021-04-21 VITALS — Ht 75.0 in | Wt 250.0 lb

## 2021-04-21 DIAGNOSIS — M726 Necrotizing fasciitis: Secondary | ICD-10-CM

## 2021-04-21 DIAGNOSIS — M86172 Other acute osteomyelitis, left ankle and foot: Secondary | ICD-10-CM

## 2021-04-24 ENCOUNTER — Telehealth (HOSPITAL_COMMUNITY): Payer: Self-pay

## 2021-04-24 NOTE — Telephone Encounter (Signed)
Called and spoke to patient's daughter to give patient a message to confirm/remind patient of their appointment at the [facility] on 04/25/21.

## 2021-04-25 ENCOUNTER — Other Ambulatory Visit (HOSPITAL_COMMUNITY): Payer: Self-pay | Admitting: Family Medicine

## 2021-04-25 ENCOUNTER — Other Ambulatory Visit (HOSPITAL_COMMUNITY): Payer: Self-pay

## 2021-04-25 ENCOUNTER — Ambulatory Visit (HOSPITAL_COMMUNITY)
Admission: RE | Admit: 2021-04-25 | Discharge: 2021-04-25 | Disposition: A | Discharge status: Home / Self Care | Payer: Self-pay | Source: Ambulatory Visit | Attending: Family Medicine | Admitting: Family Medicine

## 2021-04-25 ENCOUNTER — Encounter (HOSPITAL_COMMUNITY): Payer: Self-pay

## 2021-04-25 ENCOUNTER — Other Ambulatory Visit: Payer: Self-pay

## 2021-04-25 VITALS — BP 132/76 | HR 97 | Wt 269.4 lb

## 2021-04-25 DIAGNOSIS — Z7984 Long term (current) use of oral hypoglycemic drugs: Secondary | ICD-10-CM | POA: Insufficient documentation

## 2021-04-25 DIAGNOSIS — E118 Type 2 diabetes mellitus with unspecified complications: Secondary | ICD-10-CM

## 2021-04-25 DIAGNOSIS — E11628 Type 2 diabetes mellitus with other skin complications: Secondary | ICD-10-CM | POA: Insufficient documentation

## 2021-04-25 DIAGNOSIS — Z885 Allergy status to narcotic agent status: Secondary | ICD-10-CM | POA: Insufficient documentation

## 2021-04-25 DIAGNOSIS — N1832 Chronic kidney disease, stage 3b: Secondary | ICD-10-CM | POA: Insufficient documentation

## 2021-04-25 DIAGNOSIS — I25118 Atherosclerotic heart disease of native coronary artery with other forms of angina pectoris: Secondary | ICD-10-CM

## 2021-04-25 DIAGNOSIS — N183 Chronic kidney disease, stage 3 unspecified: Secondary | ICD-10-CM

## 2021-04-25 DIAGNOSIS — Z87891 Personal history of nicotine dependence: Secondary | ICD-10-CM | POA: Insufficient documentation

## 2021-04-25 DIAGNOSIS — Z139 Encounter for screening, unspecified: Secondary | ICD-10-CM

## 2021-04-25 DIAGNOSIS — I130 Hypertensive heart and chronic kidney disease with heart failure and stage 1 through stage 4 chronic kidney disease, or unspecified chronic kidney disease: Secondary | ICD-10-CM | POA: Insufficient documentation

## 2021-04-25 DIAGNOSIS — I5022 Chronic systolic (congestive) heart failure: Secondary | ICD-10-CM

## 2021-04-25 DIAGNOSIS — I272 Pulmonary hypertension, unspecified: Secondary | ICD-10-CM | POA: Insufficient documentation

## 2021-04-25 DIAGNOSIS — I2510 Atherosclerotic heart disease of native coronary artery without angina pectoris: Secondary | ICD-10-CM | POA: Insufficient documentation

## 2021-04-25 DIAGNOSIS — E1122 Type 2 diabetes mellitus with diabetic chronic kidney disease: Secondary | ICD-10-CM | POA: Insufficient documentation

## 2021-04-25 DIAGNOSIS — L089 Local infection of the skin and subcutaneous tissue, unspecified: Secondary | ICD-10-CM | POA: Insufficient documentation

## 2021-04-25 DIAGNOSIS — Z79899 Other long term (current) drug therapy: Secondary | ICD-10-CM | POA: Insufficient documentation

## 2021-04-25 DIAGNOSIS — E785 Hyperlipidemia, unspecified: Secondary | ICD-10-CM | POA: Insufficient documentation

## 2021-04-25 DIAGNOSIS — Z8249 Family history of ischemic heart disease and other diseases of the circulatory system: Secondary | ICD-10-CM | POA: Insufficient documentation

## 2021-04-25 DIAGNOSIS — Z596 Low income: Secondary | ICD-10-CM | POA: Insufficient documentation

## 2021-04-25 DIAGNOSIS — Z8719 Personal history of other diseases of the digestive system: Secondary | ICD-10-CM

## 2021-04-25 DIAGNOSIS — Z7982 Long term (current) use of aspirin: Secondary | ICD-10-CM | POA: Insufficient documentation

## 2021-04-25 DIAGNOSIS — I5023 Acute on chronic systolic (congestive) heart failure: Secondary | ICD-10-CM | POA: Insufficient documentation

## 2021-04-25 DIAGNOSIS — I255 Ischemic cardiomyopathy: Secondary | ICD-10-CM | POA: Insufficient documentation

## 2021-04-25 LAB — CBC
HCT: 28.8 % — ABNORMAL LOW (ref 39.0–52.0)
Hemoglobin: 9 g/dL — ABNORMAL LOW (ref 13.0–17.0)
MCH: 28.8 pg (ref 26.0–34.0)
MCHC: 31.3 g/dL (ref 30.0–36.0)
MCV: 92 fL (ref 80.0–100.0)
Platelets: 226 10*3/uL (ref 150–400)
RBC: 3.13 MIL/uL — ABNORMAL LOW (ref 4.22–5.81)
RDW: 14.6 % (ref 11.5–15.5)
WBC: 6.9 10*3/uL (ref 4.0–10.5)
nRBC: 0 % (ref 0.0–0.2)

## 2021-04-25 LAB — BASIC METABOLIC PANEL
Anion gap: 9 (ref 5–15)
BUN: 15 mg/dL (ref 6–20)
CO2: 20 mmol/L — ABNORMAL LOW (ref 22–32)
Calcium: 8.5 mg/dL — ABNORMAL LOW (ref 8.9–10.3)
Chloride: 111 mmol/L (ref 98–111)
Creatinine, Ser: 1.35 mg/dL — ABNORMAL HIGH (ref 0.61–1.24)
GFR, Estimated: >60 mL/min (ref >60)
Glucose, Bld: 124 mg/dL — ABNORMAL HIGH (ref 70–99)
Potassium: 3.6 mmol/L (ref 3.5–5.1)
Sodium: 140 mmol/L (ref 135–145)

## 2021-04-25 LAB — BRAIN NATRIURETIC PEPTIDE: B Natriuretic Peptide: 1691.7 pg/mL — ABNORMAL HIGH (ref 0.0–100.0)

## 2021-04-25 MED ORDER — TORSEMIDE 20 MG PO TABS
40.0000 mg | ORAL_TABLET | Freq: Every day | ORAL | 3 refills | Status: DC
  Filled 2021-04-25: qty 60, 30d supply, fill #0

## 2021-04-25 MED ORDER — POTASSIUM CHLORIDE CRYS ER 20 MEQ PO TBCR
20.0000 meq | EXTENDED_RELEASE_TABLET | Freq: Every day | ORAL | 3 refills | Status: DC
  Filled 2021-04-25 (×2): qty 30, 30d supply, fill #0

## 2021-04-25 MED ORDER — TORSEMIDE 40 MG PO TABS
40.0000 mg | ORAL_TABLET | Freq: Every day | ORAL | 1 refills | Status: DC
  Filled 2021-04-25: qty 90, 90d supply, fill #0

## 2021-04-25 MED ORDER — TORSEMIDE 40 MG PO TABS: 40.0000 mg | ORAL_TABLET | Freq: Every day | ORAL | 1 refills | Status: DC

## 2021-04-25 NOTE — Patient Instructions (Addendum)
EKG done today.   Labs done today. We will contact you only if your labs are abnormal.  START Torsemide 40mg  (1 tablet) by mouth 2 times daily for 3 days THEN DECREASE to 40mg  (1 tablet) by mouth daily.   No other medication changes were made. Please continue all current medications as prescribed.  [facility] transportation number: [phone]   You have been referred to Dr. . His office will contact you to schedule an appointment.   Your physician recommends that you schedule a follow-up appointment in: 7 days for a lab only appointment(can be done at Dr. [phone] office)2-3 weeks with our APP Clinic here in our office and in 2-3 months with Dr. Cliffton Asters.  If you have any questions or concerns before your next appointment please send Adam James a message through [place] or call our office at [phone].    TO LEAVE A MESSAGE FOR THE NURSE SELECT OPTION 2, PLEASE LEAVE A MESSAGE INCLUDING: YOUR NAME DATE OF BIRTH CALL BACK NUMBER REASON FOR CALL**this is important as we prioritize the call backs  YOU WILL RECEIVE A CALL BACK THE SAME DAY AS LONG AS YOU CALL BEFORE 4:00 PM   Do the following things EVERYDAY: Weigh yourself in the morning before breakfast. Write it down and keep it in a log. Take your medicines as prescribed Eat low James foods--Limit James (sodium) to 2000 mg per day.  Stay as active as you can everyday Limit all fluids for the day to less than 2 liters   At the [facility], you and your health needs are our priority. As part of our continuing mission to provide you with exceptional heart care, we have created designated Provider Care Teams. These Care Teams include your primary Cardiologist (physician) and Advanced Practice Providers (APPs- Physician Assistants and Nurse Practitioners) who all work together to provide you with the care you need, when you need it.   You may see any of the following providers on your designated Care Team at your next  follow up: Dr [place] Dr [phone], NP Arvilla Meres, Carron Curie Robbie Lis, PharmD   Please be sure to bring in all your medications bottles to every appointment. '

## 2021-04-25 NOTE — Progress Notes (Signed)
ReDS Vest / Clip - 04/25/21 1400       ReDS Vest / Clip   Station Marker D    Ruler Value 32    ReDS Value Range Moderate volume overload    ReDS Actual Value 38

## 2021-04-25 NOTE — Progress Notes (Signed)
[facility] Note    PCP: Patient, No Pcp Per (Inactive) PCP-Cardiologist: Norman Herrlich, MD  HF Cardiologist: Dr. Shirlee Latch  HPI: [name] is a 50 y.o.with a history CAD, Had PCI stent 2007 in [facility]. 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   Admitted to [facility] in February lower extremity edema and foot ulcer. BNP and HS Trop elevated and felt to be non-ACS.  Cardiology consulted and he was placed on lasix.    He had follow up with Dr Dulce Sellar 09/09/20 and was set up for cath. Cath with multivessel CAD. Saw Dr Cliffton Asters in the office with plans for CMRI. He was referred to SW due to financial concerns and no payor source. CMRI showed EF 40% and viable myocardium.     Saw Dr Cliffton Asters in July and was set up for CABG in August. Pre CABG labs completed and showed WBC 25. He was advised to go to the ED.    Admitted 02/25/2021 with left foot infection complicated by osteomyelitis and necrotizing fasciitis. Multiple debridements for limb salvage. Hospital course complicated by erosive esophagitis on EGD. Recommendations for protonix 40 mg bid + repeat EGD in 8 weeks. Received multiple transfusions. Discharged 03/15/21.    Admitted 03/23/21 with A/C CHF exacerbation. Started on IV lasix. Cardiology consulted. CO-OX 55%. Given 80 mg IV x1. V lasix increased to 100 mg tid and started on metolazone.  Repeat echo on 8/28 with LVEF of 25%, LV demonstrating global hypokinesis, and LV diastolic parameters consistent with grade II diastolic dysfunction. RHC performed 03/26/21 showed preserved cardiac output, mildly elevated PCWP, and mild pulmonary venous hypertension. LHC performed on 03/28/21 showed  minimal change from prior study in 2/22. Ost LD to Mid LAD 90% stenosed, mid LAD to Dist LAD 90% stenosed, dist LAD lesion 100% stenosed.prox Cx 70% stenosed, prox RCA to Mid  RCA 100% stenosed, dist RCA 100% stenosed, ramus 70% stenosed, 1st Mrg 50% stenosed, mid LM to Dist LM 60% stenosed. Hospitalization c/b AKI on CKD3b. He was initiated on spiro and discharged with CVTS and ID follow up. Ortho/ID consulted. Wounds healing without s/s infection. Ortho recommended RLE post op shoe and place fracture boot on left. ID recommended to continue doxycycline + augmentin.   Today he presents to AHF post-hospitalization follow up. He has not been taking torsemide, says Rx was never mailed to him.  He has significant exertional dyspnea. Chronically sleeps in recliner. Has occasional chest twinges. Denies abnormal bleeding and dizziness.  Appetite ok. No fever or chills. Does not weigh at home, does not have a scale. He saw Dr. Lajoyce Corners this past week and had his leg wrapped, will be done once a week. He lives with his daughter and his mother helps with transportation. He has financial difficulty paying for medications.  Review of Systems: [y] = yes, [ ]  = no   General: Weight gain [ ] ; Weight loss [ ] ; Anorexia [ ] ; Fatigue [ y]; Fever [ ] ; Chills [ ] ; Weakness [ ]   Cardiac: Chest pain/pressure [ ] ; Resting SOB [ ] ; Exertional SOB ]; Orthopnea [ ] ; Pedal Edema [ y]; Palpitations ]; Syncope [ ] ; Presyncope [ ] ; Paroxysmal nocturnal dyspnea[ ]   Pulmonary: Cough [ ] ; Wheezing[ ] ; Hemoptysis[ ] ; Sputum [ ] ; Snoring [ ]   GI: Vomiting[ ] ; Dysphagia[ ] ; Melena[ ] ; Hematochezia [ ] ; Heartburn[ ] ; Abdominal pain [ ] ; Constipation [ ] ; Diarrhea [ ] ;  BRBPR [ ]   GU: Hematuria[ ] ; Dysuria [ ] ; Nocturia[ ]   Vascular: Pain in legs with walking [ ] ; Pain in feet with lying flat [ ] ; Non-healing sores ]; Stroke [ ] ; TIA [ ] ; Slurred speech [ ] ;  Neuro: Headaches[ ] ; Vertigo[ ] ; Seizures[ ] ; Paresthesias[ ] ;Blurred vision [ ] ; Diplopia [ ] ; Vision changes [ ]   Ortho/Skin: Arthritis [ ] ; Joint pain [ ] ; Muscle pain [ ] ; Joint swelling [ ] ; Back Pain [ ] ; Rash [ ]   Psych: Depression[ ] ; Anxiety[  ]  Heme: Bleeding problems [ ] ; Clotting disorders [ ] ; Anemia [ ]   Endocrine: Diabetes ]; Thyroid dysfunction[ ]   Past Medical History:  Diagnosis Date   CAD (coronary artery disease)    Cardiomyopathy (HCC)    Diabetic foot ulcer (HCC)    Dyslipidemia    Heart failure (HCC)    Myocardial infarction (HCC)    Type 2 diabetes mellitus (HCC)    Current Outpatient Medications  Medication Sig Dispense Refill   amLODipine (NORVASC) 5 MG tablet Take 1 tablet (5 mg total) by mouth daily. 90 tablet 3   aspirin EC 81 MG tablet Take 1 tablet (81 mg total) by mouth daily. Swallow whole. 90 tablet 3   atorvastatin (LIPITOR) 80 MG tablet Take 1 tablet (80 mg total) by mouth daily. 90 tablet 3   glyBURIDE (DIABETA) 5 MG tablet Take 5 mg by mouth 2 (two) times daily.     metFORMIN (GLUCOPHAGE) 500 MG tablet Take 500 mg by mouth 2 (two) times daily.     metoprolol succinate (TOPROL-XL) 50 MG 24 hr tablet Take 1 tablet (50 mg total) by mouth daily. Take with or immediately following a meal. 30 tablet 0   spironolactone (ALDACTONE) 25 MG tablet Take 0.5 tablets (12.5 mg total) by mouth daily. 45 tablet 1   No current facility-administered medications for this encounter.   Allergies  Allergen Reactions   Morphine Itching   Social History   Socioeconomic History   Marital status: Single    Spouse name: Not on file   Number of children: 1   Years of education: Not on file   Highest education level: 8th grade  Occupational History   Occupation: none    Comment: former "[employer]"  Tobacco Use   Smoking status: Former    Packs/day: 3.00    Years: 36.00    Pack years: 108.00    Types: Cigarettes    Quit date: 08/27/2020    Years since quitting: 0.6   Smokeless tobacco: Never   Tobacco comments:    5-6 daily  Vaping Use   Vaping Use: Never used  Substance and Sexual Activity   Alcohol use: Not Currently    Comment: Very rare   Drug use: Not Currently    Types: Marijuana   Sexual  activity: Not on file  Other Topics Concern   Not on file  Social History Narrative   Not on file   Social Determinants of Health   Financial Resource Strain: High Risk   Difficulty of Paying Living Expenses: Very hard  Food Insecurity: No Food Insecurity   Worried About in the Last Year: Never true   Ran Out of Food in the Last Year: Never true  Transportation Needs: Unmet Transportation Needs   Lack of Transportation (Medical): Yes   Lack of Transportation (Non-Medical): Yes  Physical Activity: Not on file  Stress: Not on file  Social Connections: Not  on file  Intimate Partner Violence: Not on file   Family History  Problem Relation Age of Onset   Hypertension Mother    Heart failure Father    Hypertension Brother    Congestive Heart Failure Brother    BP 132/76   Pulse 97   Wt 122.2 kg   SpO2 95%   BMI 33.67 kg/m   Wt Readings from Last 3 Encounters:  04/25/21 122.2 kg  04/21/21 113.4 kg  03/29/21 113.5 kg   PHYSICAL EXAM: General:  NAD. No resp difficulty, chronically-ill appearing. HEENT: Normal Neck: Supple. JVP to jaw. Carotids 2+ bilat; no bruits. No lymphadenopathy or thryomegaly appreciated. Cor: PMI nondisplaced. Regular rate & rhythm. No rubs, gallops or murmurs. Lungs: Clear Abdomen: Obese, nontender, nondistended. No hepatosplenomegaly. No bruits or masses. Good bowel sounds. Extremities: No cyanosis, clubbing, rash, 3+ BLE edema, ortho boot & ACE wrap in place LLE Neuro: Alert & oriented x 3, cranial nerves grossly intact. Moves all 4 extremities w/o difficulty. Anxious..  ECG: SR with PVCs (personally reviewed).  ReDs: 38%  ASSESSMENT & PLAN: 1. CAD: LHC in 2/22 with 50% dLM, 70% ramus, 70% pLCx, totally occluded proximal RCA, 90% stenosis ostial to mid LAD, 90% mid LAD, totally occluded distal LAD.  Cardiac MRI in 4/22 showed EF 40% with substantial viable myocardium, only delayed enhancement was subendocardial in the mid to  apical inferior wall.  With this information, CABG had been planned in 8/22, but patient was admitted prior with left foot diabetic foot infection that progressed to necrotizing fasciitis.  Patient is now s/p extensive left lower leg debridement.  He has completed IV abx and is on po abx at this point.  Ideally, would have CABG.  However, need to have further healing of the left lower leg.  Also, will not be able to use veins from the left leg, and with ulceration on the right lower leg, I am not sure venous harvest from the right leg would be feasible (would likely need all arterial conduits). Repeat coronary angiography (9/22) showed minimal change from prior study in 2/22, LVEDP 16. - Continue ASA 81 daily.  - Continue atorvastatin 80 mg daily.  - As above, would ideally have CABG, but needs healing of wounds on legs first.  Will arrange follow up with [facility] to determine timing of CABG. Discussed with Dr. Shirlee Latch. 2. Acute on Chronic systolic CHF: With concern for low output HF.  Ischemic cardiomyopathy.  Initial echo in 2/22 with EF 40-45%.  Echo in 7/22 with EF 30-35%.  Echo in 8/22 with EF down to 25% with mild RV dysfunction and dilated IVC.  He was admitted this time with CHF exacerbation.  Co-ox off tunneled catheter (which was removed) was 54.8% on 8/29, suggesting at least borderline low output.  RHC 8/31 with preserved CO, mildly elevated PCWP, mild pulmonary hypertension.  NYHA IIIb, he is volume overloaded today in setting of not taking torsemide since hospitalization, weight up ~15-18 lbs, ReDs 38%. - Restart torsemide 40 mg bid x 3 days then 40 mg daily. BMET & BNP today, repeat BMET in 10 days. - Continue spironolactone 12.5 daily.  - Continue Toprol XL 50 mg daily.  - For now, no Entresto with elevated creatinine.  - Avoid SGLT2i with history of necrotizing fasciitis and poor hygiene. 3. CKD: Stage 3.  BMET today. 5. GI bleeding: History of erosive esophagitis earlier in 8/22.  He is now  on Protonix.   - CBC today. 6. Left  leg diabetic foot infection => necrotizing fasciitis: He has completed IV abx and is s/p debridement.  Completed course of doxycycline/Augmentin. - Follows with Dr. Lajoyce Corners for wound care/weekly dressing changes. 7. RLE ulceration: Suspect venous stasis.  Stable. No open areas or drainage. 8. DM2: On glyburide + Metformin. Last A1c 8.8. 9. SDOH: His Internet/phone has been shut off as he is unable to pay bill. Will reach out to [employer] for assistance.  - He has filled out paperwork for disability. - Mother helps with transportation to appts, will give [facility] number for future appts. - he was given a scale and pillbox today. He tells me he has food stamps and has enough food. - Torsemide sent to [facility] under HF fund. - He needs a PCP near [place] area. - Lives in [place] so no Paramedicine.  Follow up in 2 weeks with APP to reassess volume.   Anderson Malta Wheeler, FNP 04/25/21

## 2021-04-28 ENCOUNTER — Encounter: Payer: Self-pay | Admitting: Orthopedic Surgery

## 2021-04-28 ENCOUNTER — Other Ambulatory Visit: Payer: Self-pay

## 2021-04-28 ENCOUNTER — Ambulatory Visit (INDEPENDENT_AMBULATORY_CARE_PROVIDER_SITE_OTHER): Payer: Self-pay | Admitting: Orthopedic Surgery

## 2021-04-28 ENCOUNTER — Inpatient Hospital Stay (HOSPITAL_COMMUNITY)
Admission: EM | Admit: 2021-04-28 | Discharge: 2021-05-07 | DRG: 239 | Disposition: A | Discharge status: Home Health | Payer: Self-pay | Source: Ambulatory Visit | Attending: Internal Medicine | Admitting: Internal Medicine

## 2021-04-28 ENCOUNTER — Encounter (HOSPITAL_COMMUNITY): Payer: Self-pay | Admitting: Emergency Medicine

## 2021-04-28 ENCOUNTER — Emergency Department (HOSPITAL_COMMUNITY): Payer: Self-pay

## 2021-04-28 DIAGNOSIS — Z8614 Personal history of Methicillin resistant Staphylococcus aureus infection: Secondary | ICD-10-CM

## 2021-04-28 DIAGNOSIS — I5023 Acute on chronic systolic (congestive) heart failure: Secondary | ICD-10-CM | POA: Diagnosis present

## 2021-04-28 DIAGNOSIS — M86172 Other acute osteomyelitis, left ankle and foot: Secondary | ICD-10-CM | POA: Diagnosis present

## 2021-04-28 DIAGNOSIS — E1169 Type 2 diabetes mellitus with other specified complication: Secondary | ICD-10-CM | POA: Diagnosis present

## 2021-04-28 DIAGNOSIS — L97521 Non-pressure chronic ulcer of other part of left foot limited to breakdown of skin: Secondary | ICD-10-CM

## 2021-04-28 DIAGNOSIS — R7989 Other specified abnormal findings of blood chemistry: Secondary | ICD-10-CM | POA: Diagnosis present

## 2021-04-28 DIAGNOSIS — E1122 Type 2 diabetes mellitus with diabetic chronic kidney disease: Secondary | ICD-10-CM | POA: Diagnosis present

## 2021-04-28 DIAGNOSIS — M869 Osteomyelitis, unspecified: Secondary | ICD-10-CM | POA: Diagnosis present

## 2021-04-28 DIAGNOSIS — I96 Gangrene, not elsewhere classified: Secondary | ICD-10-CM

## 2021-04-28 DIAGNOSIS — I25118 Atherosclerotic heart disease of native coronary artery with other forms of angina pectoris: Secondary | ICD-10-CM

## 2021-04-28 DIAGNOSIS — I2582 Chronic total occlusion of coronary artery: Secondary | ICD-10-CM | POA: Diagnosis present

## 2021-04-28 DIAGNOSIS — I2510 Atherosclerotic heart disease of native coronary artery without angina pectoris: Secondary | ICD-10-CM | POA: Diagnosis present

## 2021-04-28 DIAGNOSIS — E1142 Type 2 diabetes mellitus with diabetic polyneuropathy: Secondary | ICD-10-CM | POA: Diagnosis present

## 2021-04-28 DIAGNOSIS — M21372 Foot drop, left foot: Secondary | ICD-10-CM | POA: Diagnosis not present

## 2021-04-28 DIAGNOSIS — Z79899 Other long term (current) drug therapy: Secondary | ICD-10-CM

## 2021-04-28 DIAGNOSIS — Z885 Allergy status to narcotic agent status: Secondary | ICD-10-CM

## 2021-04-28 DIAGNOSIS — Z20822 Contact with and (suspected) exposure to covid-19: Secondary | ICD-10-CM | POA: Diagnosis present

## 2021-04-28 DIAGNOSIS — Z955 Presence of coronary angioplasty implant and graft: Secondary | ICD-10-CM

## 2021-04-28 DIAGNOSIS — N179 Acute kidney failure, unspecified: Secondary | ICD-10-CM | POA: Diagnosis present

## 2021-04-28 DIAGNOSIS — Z7984 Long term (current) use of oral hypoglycemic drugs: Secondary | ICD-10-CM

## 2021-04-28 DIAGNOSIS — I959 Hypotension, unspecified: Secondary | ICD-10-CM | POA: Diagnosis not present

## 2021-04-28 DIAGNOSIS — Z833 Family history of diabetes mellitus: Secondary | ICD-10-CM

## 2021-04-28 DIAGNOSIS — E118 Type 2 diabetes mellitus with unspecified complications: Secondary | ICD-10-CM

## 2021-04-28 DIAGNOSIS — Z7982 Long term (current) use of aspirin: Secondary | ICD-10-CM

## 2021-04-28 DIAGNOSIS — Z89429 Acquired absence of other toe(s), unspecified side: Secondary | ICD-10-CM

## 2021-04-28 DIAGNOSIS — I252 Old myocardial infarction: Secondary | ICD-10-CM

## 2021-04-28 DIAGNOSIS — I255 Ischemic cardiomyopathy: Secondary | ICD-10-CM | POA: Diagnosis present

## 2021-04-28 DIAGNOSIS — L97529 Non-pressure chronic ulcer of other part of left foot with unspecified severity: Secondary | ICD-10-CM | POA: Diagnosis present

## 2021-04-28 DIAGNOSIS — M86272 Subacute osteomyelitis, left ankle and foot: Secondary | ICD-10-CM | POA: Diagnosis present

## 2021-04-28 DIAGNOSIS — E785 Hyperlipidemia, unspecified: Secondary | ICD-10-CM | POA: Diagnosis present

## 2021-04-28 DIAGNOSIS — F17210 Nicotine dependence, cigarettes, uncomplicated: Secondary | ICD-10-CM | POA: Diagnosis present

## 2021-04-28 DIAGNOSIS — I509 Heart failure, unspecified: Secondary | ICD-10-CM

## 2021-04-28 DIAGNOSIS — I493 Ventricular premature depolarization: Secondary | ICD-10-CM | POA: Diagnosis present

## 2021-04-28 DIAGNOSIS — I130 Hypertensive heart and chronic kidney disease with heart failure and stage 1 through stage 4 chronic kidney disease, or unspecified chronic kidney disease: Principal | ICD-10-CM | POA: Diagnosis present

## 2021-04-28 DIAGNOSIS — E11621 Type 2 diabetes mellitus with foot ulcer: Secondary | ICD-10-CM | POA: Diagnosis present

## 2021-04-28 DIAGNOSIS — Z8249 Family history of ischemic heart disease and other diseases of the circulatory system: Secondary | ICD-10-CM

## 2021-04-28 DIAGNOSIS — Z8719 Personal history of other diseases of the digestive system: Secondary | ICD-10-CM

## 2021-04-28 DIAGNOSIS — N1832 Chronic kidney disease, stage 3b: Secondary | ICD-10-CM | POA: Diagnosis present

## 2021-04-28 DIAGNOSIS — I272 Pulmonary hypertension, unspecified: Secondary | ICD-10-CM | POA: Diagnosis present

## 2021-04-28 DIAGNOSIS — D509 Iron deficiency anemia, unspecified: Secondary | ICD-10-CM | POA: Diagnosis present

## 2021-04-28 LAB — CBC WITH DIFFERENTIAL/PLATELET
Abs Immature Granulocytes: 0.03 10*3/uL (ref 0.00–0.07)
Basophils Absolute: 0 10*3/uL (ref 0.0–0.1)
Basophils Relative: 1 %
Eosinophils Absolute: 0.2 10*3/uL (ref 0.0–0.5)
Eosinophils Relative: 2 %
HCT: 29.9 % — ABNORMAL LOW (ref 39.0–52.0)
Hemoglobin: 9.4 g/dL — ABNORMAL LOW (ref 13.0–17.0)
Immature Granulocytes: 0 %
Lymphocytes Relative: 37 %
Lymphs Abs: 3.2 10*3/uL (ref 0.7–4.0)
MCH: 28.7 pg (ref 26.0–34.0)
MCHC: 31.4 g/dL (ref 30.0–36.0)
MCV: 91.4 fL (ref 80.0–100.0)
Monocytes Absolute: 0.5 10*3/uL (ref 0.1–1.0)
Monocytes Relative: 6 %
Neutro Abs: 4.6 10*3/uL (ref 1.7–7.7)
Neutrophils Relative %: 54 %
Platelets: 227 10*3/uL (ref 150–400)
RBC: 3.27 MIL/uL — ABNORMAL LOW (ref 4.22–5.81)
RDW: 14.6 % (ref 11.5–15.5)
WBC: 8.6 10*3/uL (ref 4.0–10.5)
nRBC: 0 % (ref 0.0–0.2)

## 2021-04-28 LAB — BRAIN NATRIURETIC PEPTIDE: B Natriuretic Peptide: 1072.2 pg/mL — ABNORMAL HIGH (ref 0.0–100.0)

## 2021-04-28 LAB — IRON AND TIBC
Iron: 27 ug/dL — ABNORMAL LOW (ref 45–182)
Saturation Ratios: 12 % — ABNORMAL LOW (ref 17.9–39.5)
TIBC: 228 ug/dL — ABNORMAL LOW (ref 250–450)
UIBC: 201 ug/dL

## 2021-04-28 LAB — COMPREHENSIVE METABOLIC PANEL
ALT: 10 U/L (ref 0–44)
AST: 12 U/L — ABNORMAL LOW (ref 15–41)
Albumin: 2.8 g/dL — ABNORMAL LOW (ref 3.5–5.0)
Alkaline Phosphatase: 72 U/L (ref 38–126)
Anion gap: 10 (ref 5–15)
BUN: 16 mg/dL (ref 6–20)
CO2: 25 mmol/L (ref 22–32)
Calcium: 8.3 mg/dL — ABNORMAL LOW (ref 8.9–10.3)
Chloride: 106 mmol/L (ref 98–111)
Creatinine, Ser: 1.52 mg/dL — ABNORMAL HIGH (ref 0.61–1.24)
GFR, Estimated: 56 mL/min — ABNORMAL LOW (ref >60)
Glucose, Bld: 162 mg/dL — ABNORMAL HIGH (ref 70–99)
Potassium: 3.5 mmol/L (ref 3.5–5.1)
Sodium: 141 mmol/L (ref 135–145)
Total Bilirubin: 0.7 mg/dL (ref 0.3–1.2)
Total Protein: 6.2 g/dL — ABNORMAL LOW (ref 6.5–8.1)

## 2021-04-28 LAB — TROPONIN I (HIGH SENSITIVITY): Troponin I (High Sensitivity): 17 ng/L (ref <18)

## 2021-04-28 LAB — HEMOGLOBIN A1C
Hgb A1c MFr Bld: 6.1 % — ABNORMAL HIGH (ref 4.8–5.6)
Mean Plasma Glucose: 128.37 mg/dL

## 2021-04-28 LAB — RESP PANEL BY RT-PCR (FLU A&B, COVID) ARPGX2
Influenza A by PCR: NEGATIVE
Influenza B by PCR: NEGATIVE
SARS Coronavirus 2 by RT PCR: NEGATIVE

## 2021-04-28 LAB — LACTIC ACID, PLASMA: Lactic Acid, Venous: 1.6 mmol/L (ref 0.5–1.9)

## 2021-04-28 LAB — CBG MONITORING, ED: Glucose-Capillary: 161 mg/dL — ABNORMAL HIGH (ref 70–99)

## 2021-04-28 LAB — FERRITIN: Ferritin: 250 ng/mL (ref 24–336)

## 2021-04-28 IMAGING — CR DG FOOT COMPLETE 3+V*L*
3 series · 3 of 3 positions shown · non-contrast
Comparison: [DATE]

CLINICAL DATA: Chronic wound over the fifth toe

EXAM:
LEFT FOOT - COMPLETE 3+ VIEW

[foot ap]
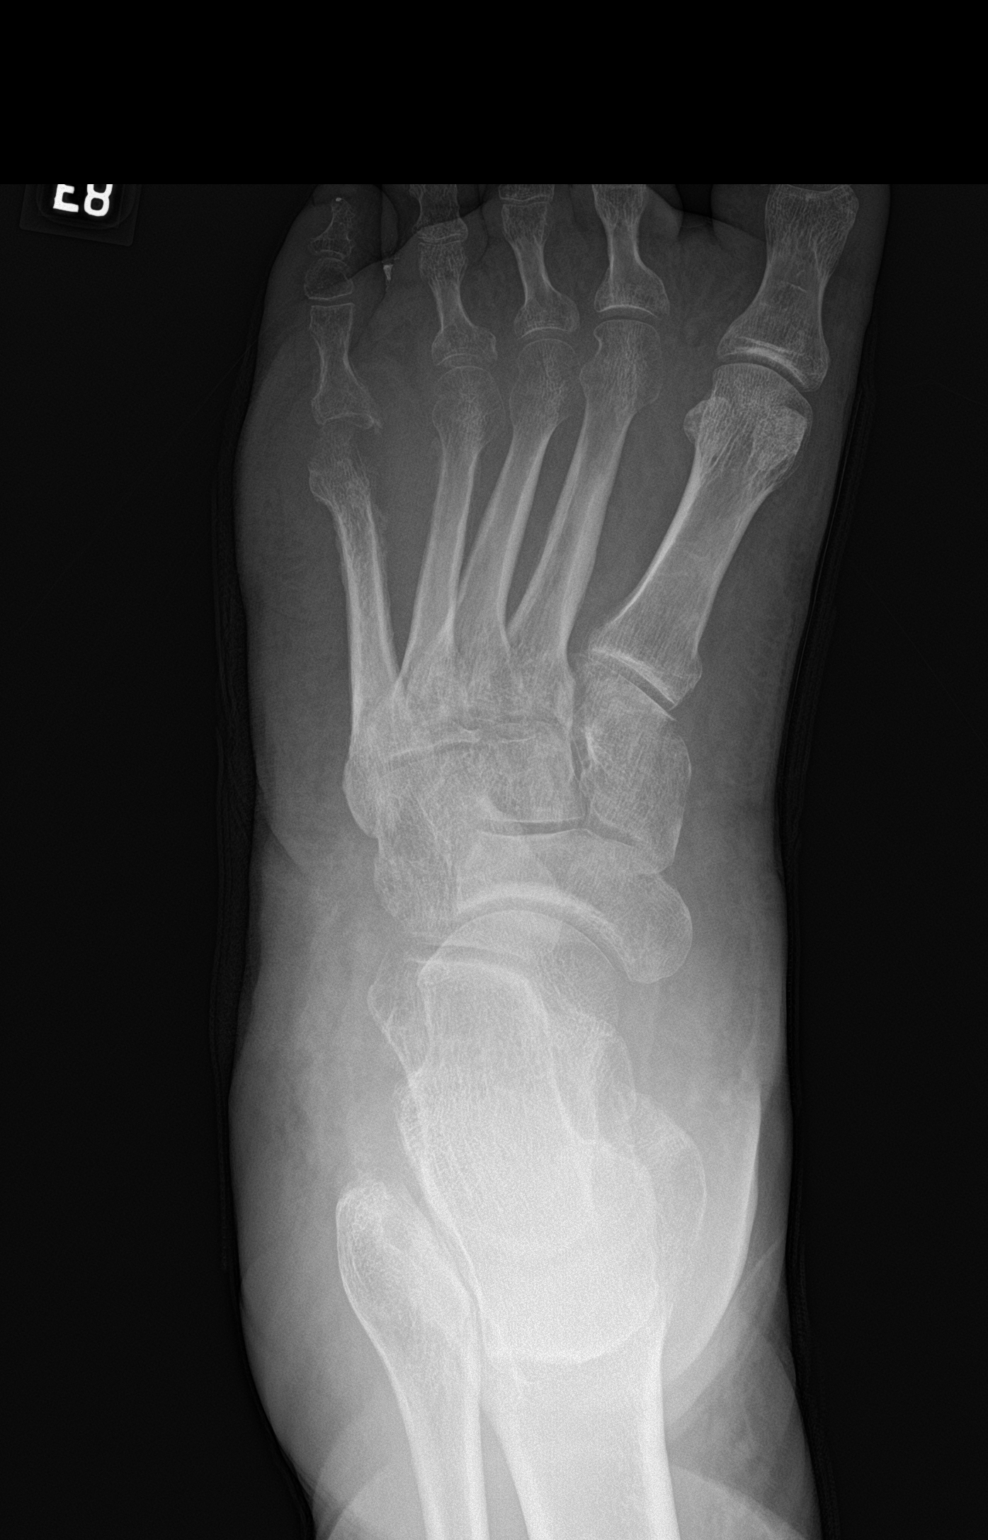

[foot obl]
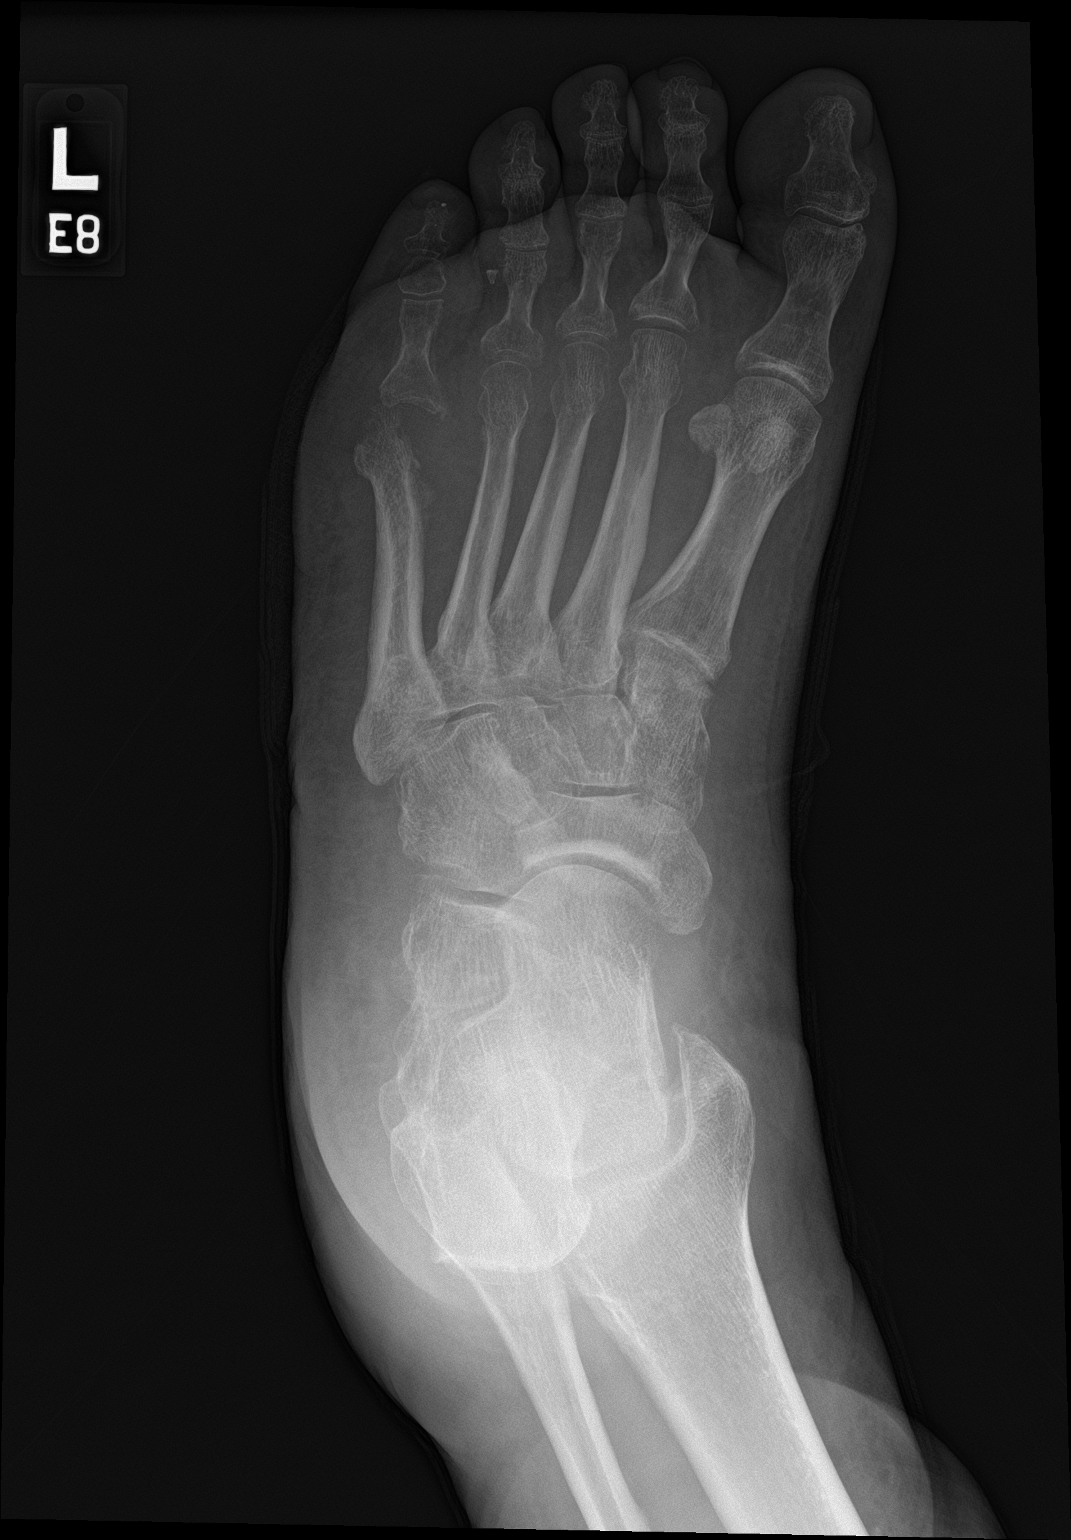

[foot lat]
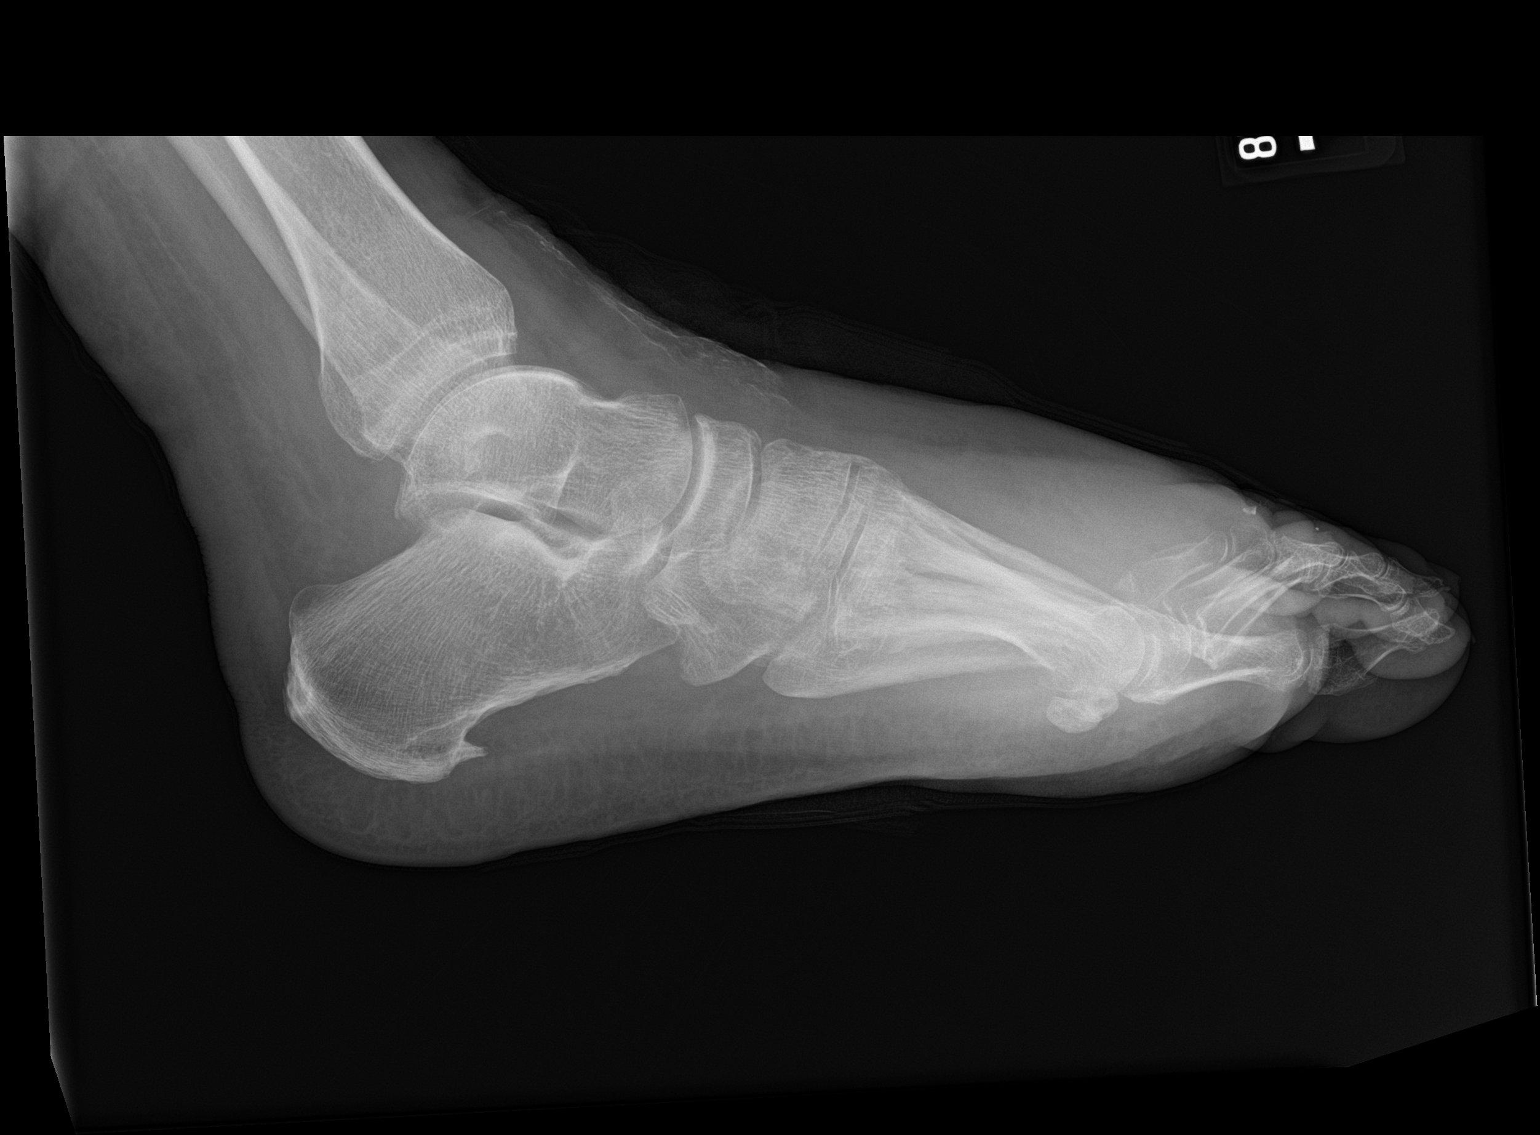

[3 of 3 positions shown; findings below may reference images not displayed]

FINDINGS: No acute fracture or dislocation. Generalized osteopenia. Soft
tissue wound of the lateral aspect of the foot. Erosive changes
involving the fifth metatarsal head and mild periostitis concerning
for osteomyelitis.
IMPRESSION: 1. Osteomyelitis of the fifth metatarsal head.

## 2021-04-28 MED ORDER — ACETAMINOPHEN 325 MG PO TABS: 650.0000 mg | ORAL_TABLET | Freq: Four times a day (QID) | ORAL | Status: DC | PRN

## 2021-04-28 MED ORDER — ATORVASTATIN CALCIUM 80 MG PO TABS
80.0000 mg | ORAL_TABLET | Freq: Every day | ORAL | Status: DC
  Administered 2021-04-28 – 2021-05-06 (×9): 80 mg via ORAL
  Filled 2021-04-28 (×6): qty 1
  Filled 2021-04-28: qty 2
  Filled 2021-04-28 (×2): qty 1

## 2021-04-28 MED ORDER — SODIUM CHLORIDE 0.9% FLUSH
3.0000 mL | Freq: Two times a day (BID) | INTRAVENOUS | Status: DC
  Administered 2021-04-29 – 2021-05-07 (×17): 3 mL via INTRAVENOUS

## 2021-04-28 MED ORDER — ASPIRIN EC 81 MG PO TBEC
81.0000 mg | DELAYED_RELEASE_TABLET | Freq: Every day | ORAL | Status: DC
  Administered 2021-04-28 – 2021-05-07 (×9): 81 mg via ORAL
  Filled 2021-04-28 (×10): qty 1

## 2021-04-28 MED ORDER — ACETAMINOPHEN 650 MG RE SUPP
650.0000 mg | Freq: Four times a day (QID) | RECTAL | Status: DC | PRN
Start: 2021-04-28 — End: 2021-05-03

## 2021-04-28 MED ORDER — HEPARIN SODIUM (PORCINE) 5000 UNIT/ML IJ SOLN
5000.0000 [IU] | Freq: Three times a day (TID) | INTRAMUSCULAR | Status: DC
  Administered 2021-04-28 – 2021-05-07 (×26): 5000 [IU] via SUBCUTANEOUS
  Filled 2021-04-28 (×27): qty 1

## 2021-04-28 MED ORDER — INSULIN ASPART 100 UNIT/ML IJ SOLN
0.0000 [IU] | Freq: Three times a day (TID) | INTRAMUSCULAR | Status: DC
  Administered 2021-04-29 (×2): 3 [IU] via SUBCUTANEOUS
  Administered 2021-04-30: 2 [IU] via SUBCUTANEOUS
  Administered 2021-05-01 (×3): 3 [IU] via SUBCUTANEOUS
  Administered 2021-05-02: 2 [IU] via SUBCUTANEOUS
  Administered 2021-05-02: 3 [IU] via SUBCUTANEOUS
  Administered 2021-05-02: 5 [IU] via SUBCUTANEOUS
  Administered 2021-05-03: 3 [IU] via SUBCUTANEOUS
  Administered 2021-05-03: 2 [IU] via SUBCUTANEOUS
  Administered 2021-05-03 – 2021-05-04 (×3): 5 [IU] via SUBCUTANEOUS
  Administered 2021-05-04 – 2021-05-05 (×2): 3 [IU] via SUBCUTANEOUS
  Administered 2021-05-05: 5 [IU] via SUBCUTANEOUS
  Administered 2021-05-05: 3 [IU] via SUBCUTANEOUS
  Administered 2021-05-06 (×2): 5 [IU] via SUBCUTANEOUS
  Administered 2021-05-06: 3 [IU] via SUBCUTANEOUS
  Administered 2021-05-07: 5 [IU] via SUBCUTANEOUS

## 2021-04-28 MED ORDER — FUROSEMIDE 10 MG/ML IJ SOLN
80.0000 mg | Freq: Once | INTRAMUSCULAR | Status: AC
  Administered 2021-04-28: 80 mg via INTRAVENOUS
  Filled 2021-04-28: qty 8

## 2021-04-28 NOTE — ED Notes (Signed)
MD just came our of the pt room and pt is willing to attempt one more time for NG tube

## 2021-04-28 NOTE — H&P (Signed)
Date: 04/28/2021               Patient Name:  Adam James MRN: [id number]  DOB: 24-May-1971 Age / Sex: 50 y.o., male   PCP: Patient, No Pcp Per (Inactive)         Medical Service: Internal Medicine Teaching Service         Attending Physician: Dr. Mayford Knife, Dorene Ar, MD    First Contact: Princess Bruins Pager: [phone]  Second Contact: Verdene Lennert, MD Pager: [phone]       After Hours (After 5p/  First Contact Pager: [phone]  weekends / holidays): Second Contact Pager: [phone]   SUBJECTIVE   Chief Complaint: Foot injury  History of Present Illness: Past medical history CAD diabetes heart failure who presented to orthopedic outpatient clinic for management of his diabetic foot ulcer.  He was determined to have progression of left foot wound with osteomyelitis after failing outpatient antibiotics doxycycline and Augmentin and was determined to need amputation of fifth ray.   Additionally he states that roughly 2 to 3 days ago he began noticing fluid retention despite restricting his fluid intake to 1 to 1.5 L.  He also reports compliance with diuretic torsemide which he had been taking 40 mg twice a day for 3 days started on 9/30 but decreased his dose to 40 mg daily today.  Has not taken his medication today.  He also reports that he has noticed some decreased urine output down to about 3 times a day.  He does have compression stockings but he has not used him stating that they are too tight to get on his legs.  Does not weigh himself.  He does endorse feeling short of breath with orthopnea and dyspnea on exertion. He denies any fever chills nausea vomiting diarrhea.  He is not having any pain.  ED Course: While in the emergency department CMP showed potassium 3.5 creatinine 1.52 up from 1.354 days ago.  Calcium 8.3 albumin 2.8.  Also noted to have elevated BNP at 1000.  Lactic acid normal 1.6.  CBC unremarkable other than a hemoglobin of 9.4.  Imaging of the left foot showed  osteomyelitis of left fifth metatarsal head.  He will be admitted for IV diuresis to improve volume status prior to orthopedic surgery.  Meds:  Current Meds  Medication Sig   amLODipine (NORVASC) 5 MG tablet Take 1 tablet (5 mg total) by mouth daily.   aspirin EC 81 MG tablet Take 1 tablet (81 mg total) by mouth daily. Swallow whole.   atorvastatin (LIPITOR) 80 MG tablet Take 1 tablet (80 mg total) by mouth daily.   glyBURIDE (DIABETA) 5 MG tablet Take 5 mg by mouth 2 (two) times daily.   metFORMIN (GLUCOPHAGE) 500 MG tablet Take 500 mg by mouth 2 (two) times daily.   metoprolol succinate (TOPROL-XL) 50 MG 24 hr tablet Take 1 tablet (50 mg total) by mouth daily. Take with or immediately following a meal.   potassium chloride SA (KLOR-CON) 20 MEQ tablet Take 1 tablet (20 mEq total) by mouth daily.   spironolactone (ALDACTONE) 25 MG tablet Take 0.5 tablets (12.5 mg total) by mouth daily.   torsemide (DEMADEX) 20 MG tablet Take 2 tablets (40 mg total) by mouth daily.    Past Medical History:  Diagnosis Date   CAD (coronary artery disease)    Cardiomyopathy (HCC)    Diabetic foot ulcer (HCC)    Dyslipidemia    Heart failure (HCC)    Myocardial  infarction [facility])    Type 2 diabetes mellitus (HCC)     Past Surgical History:  Procedure Laterality Date   APPENDECTOMY     APPLICATION OF WOUND VAC Left 03/03/2021   Procedure: APPLICATION OF WOUND VAC;  Surgeon: Tarry Kos, MD;  Location: [facility] OR;  Service: Orthopedics;  Laterality: Left;   APPLICATION OF WOUND VAC  03/07/2021   Procedure: APPLICATION OF WOUND VAC;  Surgeon: [name], MD;  Location: [facility] OR;  Service: Orthopedics;;   CARDIAC CATHETERIZATION     COLON SURGERY     ESOPHAGOGASTRODUODENOSCOPY (EGD) WITH PROPOFOL N/A 03/12/2021   Procedure: ESOPHAGOGASTRODUODENOSCOPY (EGD) WITH PROPOFOL;  Surgeon: Jenel Lucks, MD;  Location: [facility] ENDOSCOPY;  Service: Gastroenterology;  Laterality: N/A;   I & D EXTREMITY Left 02/24/2021    Procedure: IRRIGATION AND DEBRIDEMENT EXTREMITY;  Surgeon: Myrene Galas, MD;  Location: [facility] OR;  Service: Orthopedics;  Laterality: Left;   I & D EXTREMITY Left 02/26/2021   Procedure: IRRIGATION AND DEBRIDEMENT OF LEG;  Surgeon: [name], MD;  Location: [facility] OR;  Service: Orthopedics;  Laterality: Left;   I & D EXTREMITY Left 03/03/2021   Procedure: LEFT LEG IRRIGATION AND DEBRIDEMENT;  Surgeon: Tarry Kos, MD;  [facility] OR;  Service: Orthopedics;  Laterality: Left;   I & D EXTREMITY Left 03/07/2021   Procedure: REPEAT DEBRIDEMENT LEFT LEG;  Surgeon: [name], MD;  Location: [facility] OR;  Service: Orthopedics;  Laterality: Left;   IR FLUORO GUIDE CV LINE RIGHT  03/10/2021   IR REMOVAL TUN CV CATH W/O FL  03/24/2021   IR US GUIDE VASC ACCESS RIGHT  03/10/2021   LEFT HEART CATH AND CORONARY ANGIOGRAPHY N/A 09/13/2020   Procedure: LEFT HEART CATH AND CORONARY ANGIOGRAPHY;  Surgeon: Lennette Bihari, MD;  [facility]: [facility] INVASIVE CV LAB;  Service: Cardiovascular;  Laterality: N/A;   LEFT HEART CATH AND CORONARY ANGIOGRAPHY N/A 03/28/2021   Procedure: LEFT HEART CATH AND CORONARY ANGIOGRAPHY;  Surgeon: Laurey Morale, MD;  Location: [facility] INVASIVE CV LAB;  Service: Cardiovascular;  Laterality: N/A;   RIGHT HEART CATH N/A 03/26/2021   Procedure: RIGHT HEART CATH;  Surgeon: Laurey Morale, MD;  Location: [facility] INVASIVE CV LAB;  Service: Cardiovascular;  Laterality: N/A;   TOE AMPUTATION  2020    Social:  Lives With: Daughter Occupation: Support:  Level of Function: Independent PCP: No PCP on file Substances: Previous history of smoking 3 packs/day since he was 13 now only smokes around 2  Family History: Diabetes cancer on father side, heart disease in mother side  Allergies: Allergies as of 04/28/2021 - Review Complete 04/28/2021  Allergen Reaction Noted   Morphine Itching 02/23/2019    Review of Systems: A complete ROS was negative except as per HPI.   OBJECTIVE:   Physical  Exam: Blood pressure 121/87, pulse (!) 106, temperature 98.5 F (36.9 C), temperature source Oral, resp. rate (!) 21, SpO2 97 %.  Constitutional: well-appearing comfortable sitting in bed, in no acute distress HENT: normocephalic atraumatic, mucous membranes moist Eyes: conjunctiva non-erythematous Neck: supple JVD angle of mandible Cardiovascular: JVD to angle of mandible tachycardic, 2+ lower extremity edema regular rate and rhythm, no m/r/g Pulmonary/Chest: normal work of breathing on room air, lungs clear to auscultation bilaterally Abdominal: soft, non-tender, non-distended, previous surgical scars MSK: Surgical scar left lower extremity extending from ankle to knee, appears to be healing well.  Open wound on left dorsum of foot Neurological: alert & oriented x  3, 5/5 strength in bilateral upper and lower extremities, normal gait Skin: Right lower extremity edema with weeping.  Left leg with surgical scar extending from ankle to knee appears to be healing well few areas with granulation tissue.  Wound to left dorsum of foot granulation tissue no purulent drainage.  Dorsum of left foot over fifth metatarsal head with open diabetic wound.  No erythema or edema or purulent drainage. Psych: Mood and affect appropriate  Labs: CBC    Component Value Date/Time   WBC 8.6 04/28/2021 1047   RBC 3.27 (L) 04/28/2021 1047   HGB 9.4 (L) 04/28/2021 1047   HGB 13.3 09/09/2020 1341   HCT 29.9 (L) 04/28/2021 1047   HCT 39.3 09/09/2020 1341   PLT 227 04/28/2021 1047   PLT 276 09/09/2020 1341   MCV 91.4 04/28/2021 1047   MCV 86 09/09/2020 1341   MCH 28.7 04/28/2021 1047   MCHC 31.4 04/28/2021 1047   RDW 14.6 04/28/2021 1047   RDW 12.6 09/09/2020 1341   LYMPHSABS 3.2 04/28/2021 1047   MONOABS 0.5 04/28/2021 1047   EOSABS 0.2 04/28/2021 1047   BASOSABS 0.0 04/28/2021 1047     CMP     Component Value Date/Time   NA 141 04/28/2021 1047   NA 138 10/07/2020 1004   K 3.5 04/28/2021 1047   CL  106 04/28/2021 1047   CO2 25 04/28/2021 1047   GLUCOSE 162 (H) 04/28/2021 1047   BUN 16 04/28/2021 1047   BUN 26 (H) 10/07/2020 1004   CREATININE 1.52 (H) 04/28/2021 1047   CALCIUM 8.3 (L) 04/28/2021 1047   PROT 6.2 (L) 04/28/2021 1047   ALBUMIN 2.8 (L) 04/28/2021 1047   AST 12 (L) 04/28/2021 1047   ALT 10 04/28/2021 1047   ALKPHOS 72 04/28/2021 1047   BILITOT 0.7 04/28/2021 1047   GFRNONAA 56 (L) 04/28/2021 1047   GFRAA 86 09/09/2020 1341    Imaging: DG Foot Complete Left  Result Date: 04/28/2021 CLINICAL DATA:  Chronic wound over the fifth toe EXAM: LEFT FOOT - COMPLETE 3+ VIEW COMPARISON:  02/24/2021 FINDINGS: No acute fracture or dislocation. Generalized osteopenia. Soft tissue wound of the lateral aspect of the foot. Erosive changes involving the fifth metatarsal head and mild periostitis concerning for osteomyelitis. IMPRESSION: 1. Osteomyelitis of the fifth metatarsal head. Electronically Signed   By: Elige Ko M.D.   On: 04/28/2021 12:54    EKG: personally reviewed my interpretation is sinus tach with PVC sees   ASSESSMENT & PLAN:    Assessment & Plan by Problem: Principal Problem:   Acute exacerbation of CHF (congestive heart failure) (HCC) Active Problems:   Diabetic foot ulcer (HCC)   CAD (coronary artery disease)   Osteomyelitis (HCC)   Adam James is a 50 y.o. with pertinent PMH of diabetes hypertension heart disease and tobacco abuse who presented with foot wound and admitted for acute CHF and diabetic foot wound with osteomyelitis on hospital day 0  #Acute on chronic congestive heart failure History NSTEMI August 2022 Echo from 03/25/2019 showing EF 25% BNP elevated 1000.  Evidence of volume overload on physical exam with some JVD and lower extremity edema. Blood pressure has been stable 120s we will hold beta-blocker for now in the setting of acute heart failure, can restart tomorrow if no low output.  We will also hold his spironolactone for now but  this can likely be restarted tomorrow as well. History of hypokalemia on Aldactone and potassium supplements. - Strict I's and O's monitor  daily weights. - Given Lasix injection 80 mg.  Will monitor for response. - Daily BMP monitor for hypokalemia in the setting of diuresis  #Osteomyelitis of left fifth metatarsal head. Diabetic foot ulcer Patient failed outpatient doxycycline and Augmentin.  Evaluated at orthopedic office today determined to have progression of osteo-.  Ortho is aware and planning for amputation on October 5. Patient is currently afebrile vital signs stable and no elevation in WBC.  Will defer antibiotics for now to prevent resistance and avoid patient complications including C. difficile and kidney injury. Will defer further management to Ortho  #Chronic kidney disease GFR 56 creatinine 1.52 up from 1.3 4 days ago.  Likely related to diabetes and hypertension exacerbated by recent CHF exacerbation. Anticipate this will improve with diuresis and improved cardiac function Continue to monitor  Type 2 diabetes with complications including diabetic foot ulcer and polyneuropathy Sliding scale insulin and CBG checks.  Tobacco abuse. 3 pack/day since the age of 28 history.  Does not currently feel the urge to smoke.  Can give nicotine patch if necessary.  Anemia Hemoglobin 9.4 today.  Asymptomatic.  Transfuse as necessary  Diet: Carb/Renal VTE: Heparin IVF: None,None Code: Full  Prior to Admission Living Arrangement: Home, living with daughter Anticipated Discharge Location: Home Barriers to Discharge: Pending medical improvement  Dispo: Admit patient to Inpatient with expected length of stay greater than 2 midnights.  Signed: Adron Bene, MD Internal Medicine Resident PGY-1 Pager: [phone]  04/28/2021, 10:56 PM

## 2021-04-28 NOTE — Progress Notes (Signed)
Office Visit Note   Patient: Adam James           Date of Birth: 1971/07/07           MRN: [id number] Visit Date: 04/28/2021              Requested by: No referring provider defined for this encounter. PCP: Patient, No Pcp Per (Inactive)  Chief Complaint  Patient presents with   Left Leg - Routine Post Op    03/07/21 repeat debridement LLE      HPI: Patient presents in follow-up for a large infection that extended from the fifth metatarsal head up to the knee.  The leg is showing excellent improvement however patient has developed progressive ulceration and drainage beneath the fifth metatarsal head.  Assessment & Plan: Visit Diagnoses:  1. Subacute osteomyelitis, left ankle and foot (HCC)     Plan: Patient has developed progressive osteomyelitis of the fifth metatarsal head.  The leg looks good with no signs of recurrent infection no swelling.  Patient increasing swelling in his lower extremity with his history of congestive heart failure with an ejection fraction of 20% is concerning.  Recommend the patient go to the emergency room be admitted to the hospitalist service to be treated for the congestive heart failure and I will plan for amputation of the fifth ray on Wednesday.  Follow-Up Instructions: Return in about 1 week (around 05/05/2021).   Ortho Exam  Patient is alert, oriented, no adenopathy, well-dressed, normal affect, normal respiratory effort. Examination patient complains of worsening congestive heart failure symptoms with increasing swelling of the right lower extremity and increasing drainage beneath the fifth metatarsal head left leg he states the left leg is doing well after the debridement.  Examination patient has an ulcer that probes to bone with clear drainage consistent with osteomyelitis of the fifth metatarsal head there is no ascending cellulitis the surgical incision from the foot to the knee is healed well with no signs of infection.  Patient has  significant pitting edema in the right lower extremity.  Imaging: No results found. No images are attached to the encounter.  Labs: Lab Results  Component Value Date   HGBA1C 8.8 (H) 02/21/2021   ESRSEDRATE 25 (H) 03/14/2021   ESRSEDRATE >140 (H) 02/25/2021   CRP 1.6 (H) 03/14/2021   REPTSTATUS 03/05/2021 FINAL 02/26/2021   GRAMSTAIN  02/26/2021    FEW WBC PRESENT, PREDOMINANTLY PMN NO ORGANISMS SEEN    CULT  02/26/2021    RARE STREPTOCOCCUS AGALACTIAE TESTING AGAINST S. AGALACTIAE NOT ROUTINELY PERFORMED DUE TO PREDICTABILITY OF AMP/PEN/VAN SUSCEPTIBILITY. CRITICAL RESULT CALLED TO, READ BACK BY AND VERIFIED WITH: RN C.WELLS AT 1241 ON 02/27/2021 BY T.SAAD. RARE FINEGOLDIA MAGNA RARE CLOSTRIDIUM CLOSTRIDIOFORME CRITICAL RESULT CALLED TO, READ BACK BY AND VERIFIED WITH: RN Aldona Lento [phone] FCP Performed at [facility], [address], [place], [place] [address]    LABORGA METHICILLIN RESISTANT STAPHYLOCOCCUS AUREUS 02/24/2021     Lab Results  Component Value Date   ALBUMIN 1.7 (L) 03/24/2021   ALBUMIN 1.5 (L) 03/15/2021   ALBUMIN 1.9 (L) 02/24/2021    Lab Results  Component Value Date   MG 2.0 03/28/2021   MG 1.7 03/27/2021   MG 1.6 (L) 03/26/2021   No results found for: VD25OH  No results found for: PREALBUMIN CBC EXTENDED Latest Ref Rng & Units 04/25/2021 03/29/2021 03/28/2021  WBC 4.0 - 10.5 K/uL 6.9 6.9 7.1  RBC 4.22 - 5.81 MIL/uL 3.13(L) 3.04(L)  2.85(L)  HGB 13.0 - 17.0 g/dL 9.0(L) 8.9(L) 8.4(L)  HCT 39.0 - 52.0 % 28.8(L) 27.5(L) 25.7(L)  PLT 150 - 400 K/uL 226 246 234  NEUTROABS 1.7 - 7.7 K/uL - - -  LYMPHSABS 0.7 - 4.0 K/uL - - -     There is no height or weight on file to calculate BMI.  Orders:  No orders of the defined types were placed in this encounter.  No orders of the defined types were placed in this encounter.    Procedures: No procedures performed  Clinical Data: No additional findings.  ROS:  All other systems negative,  except as noted in the HPI. Review of Systems  Objective: Vital Signs: There were no vitals taken for this visit.  Specialty Comments:  No specialty comments available.  PMFS History: Patient Active Problem List   Diagnosis Date Noted   Venous stasis ulcer of right calf limited to breakdown of skin without varicose veins (HCC)    Non-pressure chronic ulcer of other part of left foot limited to breakdown of skin (HCC)    Osteomyelitis (HCC)    Acute on chronic combined systolic and diastolic CHF (congestive heart failure) (HCC)    Acute on chronic heart failure (HCC) 03/23/2021   NSTEMI (non-ST elevated myocardial infarction) (HCC)    Gastroesophageal reflux disease with esophagitis and hemorrhage    Anemia, posthemorrhagic, acute    Acute esophagitis    Gastrointestinal hemorrhage    Necrotizing fasciitis (HCC)    Left leg cellulitis    Diabetic polyneuropathy associated with type 2 diabetes mellitus (HCC)    Severe protein-calorie malnutrition (HCC)    Gangrene of left foot (HCC) 02/25/2021   Type 2 diabetes mellitus (HCC)    Heart failure (HCC)    Dyslipidemia    Diabetic foot ulcer (HCC)    CAD (coronary artery disease)    Cardiomyopathy (HCC)    Coronary artery disease involving native coronary artery of native heart with angina pectoris [facility])    Past Medical History:  Diagnosis Date   CAD (coronary artery disease)    Cardiomyopathy (HCC)    Diabetic foot ulcer (HCC)    Dyslipidemia    Heart failure (HCC)    Myocardial infarction (HCC)    Type 2 diabetes mellitus (HCC)     Family History  Problem Relation Age of Onset   Hypertension Mother    Heart failure Father    Hypertension Brother    Congestive Heart Failure Brother     Past Surgical History:  Procedure Laterality Date   APPENDECTOMY     APPLICATION OF WOUND VAC Left 03/03/2021   Procedure: APPLICATION OF WOUND VAC;  Surgeon: Tarry Kos, MD;  Location: [facility] OR;  Service: Orthopedics;  Laterality: Left;    APPLICATION OF WOUND VAC  03/07/2021   Procedure: APPLICATION OF WOUND VAC;  Surgeon: [name], MD;  Location: [facility] OR;  Service: Orthopedics;;   CARDIAC CATHETERIZATION     COLON SURGERY     ESOPHAGOGASTRODUODENOSCOPY (EGD) WITH PROPOFOL N/A 03/12/2021   Procedure: ESOPHAGOGASTRODUODENOSCOPY (EGD) WITH PROPOFOL;  Surgeon: Jenel Lucks, MD;  Location: [facility] ENDOSCOPY;  Service: Gastroenterology;  Laterality: N/A;   I & D EXTREMITY Left 02/24/2021   Procedure: IRRIGATION AND DEBRIDEMENT EXTREMITY;  Surgeon: Myrene Galas, MD;  Location: [facility] OR;  Service: Orthopedics;  Laterality: Left;   I & D EXTREMITY Left 02/26/2021   Procedure: IRRIGATION AND DEBRIDEMENT OF LEG;  Surgeon: [name], MD;  Location: [facility]  OR;  Service: Orthopedics;  Laterality: Left;   I & D EXTREMITY Left 03/03/2021   Procedure: LEFT LEG IRRIGATION AND DEBRIDEMENT;  Surgeon: Tarry Kos, MD;  Location: [facility] OR;  Service: Orthopedics;  Laterality: Left;   I & D EXTREMITY Left 03/07/2021   Procedure: REPEAT DEBRIDEMENT LEFT LEG;  Surgeon: [name], MD;  Location: [facility] OR;  Service: Orthopedics;  Laterality: Left;   IR FLUORO GUIDE CV LINE RIGHT  03/10/2021   IR REMOVAL TUN CV CATH W/O FL  03/24/2021   IR US GUIDE VASC ACCESS RIGHT  03/10/2021   LEFT HEART CATH AND CORONARY ANGIOGRAPHY N/A 09/13/2020   Procedure: LEFT HEART CATH AND CORONARY ANGIOGRAPHY;  Surgeon: Lennette Bihari, MD;  [facility]: [facility] INVASIVE CV LAB;  Service: Cardiovascular;  Laterality: N/A;   LEFT HEART CATH AND CORONARY ANGIOGRAPHY N/A 03/28/2021   Procedure: LEFT HEART CATH AND CORONARY ANGIOGRAPHY;  Surgeon: Laurey Morale, MD;  Location: [facility] INVASIVE CV LAB;  Service: Cardiovascular;  Laterality: N/A;   RIGHT HEART CATH N/A 03/26/2021   Procedure: RIGHT HEART CATH;  Surgeon: Laurey Morale, MD;  Location: [facility] INVASIVE CV LAB;  Service: Cardiovascular;  Laterality: N/A;   TOE AMPUTATION  2020   Social History   Occupational History   Occupation:  none    Comment: former [employer]"  Tobacco Use   Smoking status: Former    Packs/day: 3.00    Years: 36.00    Pack years: 108.00    Types: Cigarettes    Quit date: 08/27/2020    Years since quitting: 0.6   Smokeless tobacco: Never   Tobacco comments:    5-6 daily  Vaping Use   Vaping Use: Never used  Substance and Sexual Activity   Alcohol use: Not Currently    Comment: Very rare   Drug use: Not Currently    Types: Marijuana   Sexual activity: Not on file

## 2021-04-28 NOTE — ED Triage Notes (Signed)
Patient states he was sent to ED by PCP for evaluation of wound on left foot since July.

## 2021-04-28 NOTE — ED Provider Notes (Signed)
[facility] EMERGENCY DEPARTMENT Provider Note   CSN: [id number] Arrival date & time: 04/28/21  1019     History Chief Complaint  Patient presents with   Foot Injury    Adam James is a 50 y.o. male with past medical history of CAD, diabetic foot ulcer complicated by gangrene of left plantar ulcer, heart failure, MI, type 2 diabetes who presents today after being evaluated by orthopedics as an outpatient and determined to have progression of left plantar foot wound necessitating amputation of fifth ray complicated by overt volume overload concerning for acute on chronic heart failure exacerbation.  Patient says that over the last several weeks since recent discharge from [facility], he has been changing his wound dressings at home by himself, however recently wound care has been assisting in dressing changes and these have been changed weekly rather than daily by the patient.  At this time, he feels that he has had a worsening odor to the wound.  He also states that he notes fluid retention since discharge despite fluid restricting at home of 1 to 1.5 L.  He does not weigh himself daily.  He was started on torsemide 40 mg twice daily for 3 days on 9/30 and told to decrease this to 40 mg daily today.  He has not taken medication today.  He reports only urinating 2-3 times per day, if that which he does not feel is sufficient.  He reports shortness of breath that is worse with exertion and still present at rest.    Past Medical History:  Diagnosis Date   CAD (coronary artery disease)    Cardiomyopathy (HCC)    Diabetic foot ulcer (HCC)    Dyslipidemia    Heart failure (HCC)    Myocardial infarction (HCC)    Type 2 diabetes mellitus (HCC)     Patient Active Problem List   Diagnosis Date Noted   Venous stasis ulcer of right calf limited to breakdown of skin without varicose veins (HCC)    Non-pressure chronic ulcer of other part of left foot limited to breakdown of  skin (HCC)    Osteomyelitis (HCC)    Acute on chronic combined systolic and diastolic CHF (congestive heart failure) (HCC)    Acute on chronic heart failure (HCC) 03/23/2021   NSTEMI (non-ST elevated myocardial infarction) (HCC)    Gastroesophageal reflux disease with esophagitis and hemorrhage    Anemia, posthemorrhagic, acute    Acute esophagitis    Gastrointestinal hemorrhage    Necrotizing fasciitis (HCC)    Left leg cellulitis    Diabetic polyneuropathy associated with type 2 diabetes mellitus (HCC)    Severe protein-calorie malnutrition (HCC)    Gangrene of left foot (HCC) 02/25/2021   Type 2 diabetes mellitus (HCC)    Heart failure (HCC)    Dyslipidemia    Diabetic foot ulcer (HCC)    CAD (coronary artery disease)    Cardiomyopathy (HCC)    Coronary artery disease involving native coronary artery of native heart with angina pectoris [facility])     Past Surgical History:  Procedure Laterality Date   APPENDECTOMY     APPLICATION OF WOUND VAC Left 03/03/2021   Procedure: APPLICATION OF WOUND VAC;  Surgeon: Tarry Kos, MD;  Location: [facility] OR;  Service: Orthopedics;  Laterality: Left;   APPLICATION OF WOUND VAC  03/07/2021   Procedure: APPLICATION OF WOUND VAC;  Surgeon: [name], MD;  Location: [facility] OR;  Service: Orthopedics;;   CARDIAC CATHETERIZATION  COLON SURGERY     ESOPHAGOGASTRODUODENOSCOPY (EGD) WITH PROPOFOL N/A 03/12/2021   Procedure: ESOPHAGOGASTRODUODENOSCOPY (EGD) WITH PROPOFOL;  Surgeon: Jenel Lucks, MD;  Location: [facility] ENDOSCOPY;  Service: Gastroenterology;  Laterality: N/A;   I & D EXTREMITY Left 02/24/2021   Procedure: IRRIGATION AND DEBRIDEMENT EXTREMITY;  Surgeon: Myrene Galas, MD;  Location: [facility] OR;  Service: Orthopedics;  Laterality: Left;   I & D EXTREMITY Left 02/26/2021   Procedure: IRRIGATION AND DEBRIDEMENT OF LEG;  Surgeon: [name], MD;  Location: [facility] OR;  Service: Orthopedics;  Laterality: Left;   I & D EXTREMITY Left 03/03/2021   Procedure:  LEFT LEG IRRIGATION AND DEBRIDEMENT;  Surgeon: Tarry Kos, MD;  Location: [facility] OR;  Service: Orthopedics;  Laterality: Left;   I & D EXTREMITY Left 03/07/2021   Procedure: REPEAT DEBRIDEMENT LEFT LEG;  Surgeon: [name], MD;  Location: [facility] OR;  Service: Orthopedics;  Laterality: Left;   IR FLUORO GUIDE CV LINE RIGHT  03/10/2021   IR REMOVAL TUN CV CATH W/O FL  03/24/2021   IR US GUIDE VASC ACCESS RIGHT  03/10/2021   LEFT HEART CATH AND CORONARY ANGIOGRAPHY N/A 09/13/2020   Procedure: LEFT HEART CATH AND CORONARY ANGIOGRAPHY;  Surgeon: Lennette Bihari, MD;  Location: [facility] INVASIVE CV LAB;  Service: Cardiovascular;  Laterality: N/A;   LEFT HEART CATH AND CORONARY ANGIOGRAPHY N/A 03/28/2021   Procedure: LEFT HEART CATH AND CORONARY ANGIOGRAPHY;  Surgeon: Laurey Morale, MD;  Location: [facility] INVASIVE CV LAB;  Service: Cardiovascular;  Laterality: N/A;   RIGHT HEART CATH N/A 03/26/2021   Procedure: RIGHT HEART CATH;  Surgeon: Laurey Morale, MD;  Location: [facility] INVASIVE CV LAB;  Service: Cardiovascular;  Laterality: N/A;   TOE AMPUTATION  2020       Family History  Problem Relation Age of Onset   Hypertension Mother    Heart failure Father    Hypertension Brother    Congestive Heart Failure Brother     Social History   Tobacco Use   Smoking status: Former    Packs/day: 3.00    Years: 36.00    Pack years: 108.00    Types: Cigarettes    Quit date: 08/27/2020    Years since quitting: 0.6   Smokeless tobacco: Never   Tobacco comments:    5-6 daily  Vaping Use   Vaping Use: Never used  Substance Use Topics   Alcohol use: Not Currently    Comment: Very rare   Drug use: Not Currently    Types: Marijuana    Home Medications Prior to Admission medications   Medication Sig Start Date End Date Taking? Authorizing Provider  amLODipine (NORVASC) 5 MG tablet Take 1 tablet (5 mg total) by mouth daily. 02/20/21   Baldo Daub, MD  aspirin EC 81 MG tablet Take 1 tablet (81 mg total) by mouth  daily. Swallow whole. 02/20/21   Baldo Daub, MD  atorvastatin (LIPITOR) 80 MG tablet Take 1 tablet (80 mg total) by mouth daily. 02/20/21   Baldo Daub, MD  glyBURIDE (DIABETA) 5 MG tablet Take 5 mg by mouth 2 (two) times daily. 10/12/20   [provider]  metFORMIN (GLUCOPHAGE) 500 MG tablet Take 500 mg by mouth 2 (two) times daily. 10/12/20   [provider]  metoprolol succinate (TOPROL-XL) 50 MG 24 hr tablet Take 1 tablet (50 mg total) by mouth daily. Take with or immediately following a meal. 03/15/21 04/25/22  Atway, Derwood Kaplan,  DO  potassium chloride SA (KLOR-CON) 20 MEQ tablet Take 1 tablet (20 mEq total) by mouth daily. 04/25/21   Jacklynn Ganong, FNP  spironolactone (ALDACTONE) 25 MG tablet Take 0.5 tablets (12.5 mg total) by mouth daily. 03/30/21 09/26/21  Rehman, Areeg N, DO  torsemide (DEMADEX) 20 MG tablet Take 2 tablets (40 mg total) by mouth daily. 04/25/21   Jacklynn Ganong, FNP    Allergies    Morphine  Review of Systems   Review of Systems  Respiratory:  Positive for shortness of breath. Negative for wheezing.        Positive for orthopnea, exertional shortness of breath.  Cardiovascular:  Positive for leg swelling. Negative for chest pain.       Positive for worsening edema over the last 2 weeks despite diuretic use.  Genitourinary:  Negative for dysuria.       Patient reports 2-3 episodes of urination during the day, if not less.    Skin:        Positive for new odor coming from right foot, drainage from right foot ulcer.  Psychiatric/Behavioral:  Positive for agitation. The patient is nervous/anxious.    Physical Exam Updated Vital Signs BP 120/86 (BP Location: Right Arm)   Pulse (!) 103   Temp 98.2 F (36.8 C)   Resp 14   SpO2 94%   Constitutional: Patient is sitting comfortably in hall bed.  No acute distress noted. Cardio: Regular rate and rhythm.  No murmurs, rubs, gallops. Pulm: Clear to auscultation bilaterally.  Normal work of  breathing on room air. MSK: Bilateral lower extremity pitting edema, 2+ extending to mid thigh.  Above the knee bilaterally, edema is dependent. Previous wounds over right plantar and dorsal foot appear dry, no erythema or increased warmth.  Wound over plantar aspect of right foot appears ulcerated, red, increased warmth to right foot.  There is odor to the right foot.  Foot is nonpainful to palpation. Skin: Skin is dry, warm, with increased flakiness. Neuro: Alert and oriented x3.  No focal deficit noted. Psych: Normal mood and affect.  Patient is openly upset and worried regarding his present situation.  ED Results / Procedures / Treatments   Labs (all labs ordered are listed, but only abnormal results are displayed) Labs Reviewed  CBC WITH DIFFERENTIAL/PLATELET - Abnormal; Notable for the following components:      Result Value   RBC 3.27 (*)    Hemoglobin 9.4 (*)    HCT 29.9 (*)    All other components within normal limits  COMPREHENSIVE METABOLIC PANEL - Abnormal; Notable for the following components:   Glucose, Bld 162 (*)    Creatinine, Ser 1.52 (*)    Calcium 8.3 (*)    Total Protein 6.2 (*)    Albumin 2.8 (*)    AST 12 (*)    GFR, Estimated 56 (*)    All other components within normal limits  BRAIN NATRIURETIC PEPTIDE - Abnormal; Notable for the following components:   B Natriuretic Peptide 1,072.2 (*)    All other components within normal limits  LACTIC ACID, PLASMA  LACTIC ACID, PLASMA    EKG None  Radiology DG Foot Complete Left  Result Date: 04/28/2021 CLINICAL DATA:  Chronic wound over the fifth toe EXAM: LEFT FOOT - COMPLETE 3+ VIEW COMPARISON:  02/24/2021 FINDINGS: No acute fracture or dislocation. Generalized osteopenia. Soft tissue wound of the lateral aspect of the foot. Erosive changes involving the fifth metatarsal head and mild periostitis concerning for  osteomyelitis. IMPRESSION: 1. Osteomyelitis of the fifth metatarsal head. Electronically Signed   By:  Elige Ko M.D.   On: 04/28/2021 12:54    Procedures None  Medications Ordered in ED Medications - No data to display  ED Course  I have reviewed the triage vital signs and the nursing notes.  Pertinent labs & imaging results that were available during my care of the patient were reviewed by me and considered in my medical decision making (see chart for details).    MDM Rules/Calculators/A&P                           Patient presented after being found to have gangrenous progression of right plantar foot ulcer necessitating amputation complicated by acute heart failure exacerbation as evidenced by marked bilateral lower extremity pitting edema.  Patient also reports orthopnea, dyspnea on exertion.  He has not taken diuretic medication today.  Admission BNP of 1072.2.  Creatinine acutely elevated to 1.52 from 1.35 on 9/30.  Fortunately, there is no leukocytosis, fever.  Foot x-ray showed osteomyelitis of the fifth metatarsal head.  We will consult medicine service for admission for acute on chronic heart failure exacerbation and osteomyelitis of the left fifth metatarsal head with planned amputation 10/5. Final Clinical Impression(s) / ED Diagnoses Final diagnoses:  Acute osteomyelitis of left foot (HCC)  Acute on chronic heart failure, unspecified heart failure type [facility])    Rx / DC Orders ED Discharge Orders     None        Champ Mungo, DO 04/28/21 1806    Milagros Loll, MD 04/28/21 2311

## 2021-04-28 NOTE — ED Provider Notes (Signed)
Emergency Medicine Provider Triage Evaluation Note  Adam James , a 50 y.o. male  was evaluated in triage.  Pt complains of left foot infection.  History of same, multiple surgeries to affected extremity.  States that he was sent here to be admitted for amputation of toe.  Also has heart failure, more edema to extremities today.  He is short of breath at baseline, awaiting resolution of foot infection to receive triple bypass surgery.  No increase in shortness of breath today.  Review of Systems  Positive: Left foot pain, edema Negative: Fevers, chills, nausea, vomiting  Physical Exam  BP 126/85 (BP Location: Right Arm)   Pulse (!) 109   Temp 98.2 F (36.8 C) (Oral)   Resp 16   SpO2 98%  Gen:   Awake, no distress   Resp:  Normal effort  MSK:   Postsurgical wrapping to left lower extremity, 2+ pitting edema  Medical Decision Making  Medically screening exam initiated at 10:40 AM.  Appropriate orders placed.  Adam James was informed that the remainder of the evaluation will be completed by another provider, this initial triage assessment does not replace that evaluation, and the importance of remaining in the ED until their evaluation is complete.     Vear Clock 04/28/21 1044    Bethann Berkshire, MD 05/03/21 1112

## 2021-04-29 DIAGNOSIS — I509 Heart failure, unspecified: Secondary | ICD-10-CM

## 2021-04-29 DIAGNOSIS — E13621 Other specified diabetes mellitus with foot ulcer: Secondary | ICD-10-CM

## 2021-04-29 DIAGNOSIS — L97509 Non-pressure chronic ulcer of other part of unspecified foot with unspecified severity: Secondary | ICD-10-CM

## 2021-04-29 DIAGNOSIS — M86172 Other acute osteomyelitis, left ankle and foot: Secondary | ICD-10-CM

## 2021-04-29 LAB — CBC
HCT: 27 % — ABNORMAL LOW (ref 39.0–52.0)
Hemoglobin: 8.8 g/dL — ABNORMAL LOW (ref 13.0–17.0)
MCH: 29.4 pg (ref 26.0–34.0)
MCHC: 32.6 g/dL (ref 30.0–36.0)
MCV: 90.3 fL (ref 80.0–100.0)
Platelets: 184 10*3/uL (ref 150–400)
RBC: 2.99 MIL/uL — ABNORMAL LOW (ref 4.22–5.81)
RDW: 14.8 % (ref 11.5–15.5)
WBC: 6.8 10*3/uL (ref 4.0–10.5)
nRBC: 0 % (ref 0.0–0.2)

## 2021-04-29 LAB — BASIC METABOLIC PANEL
Anion gap: 9 (ref 5–15)
BUN: 13 mg/dL (ref 6–20)
CO2: 26 mmol/L (ref 22–32)
Calcium: 7.9 mg/dL — ABNORMAL LOW (ref 8.9–10.3)
Chloride: 105 mmol/L (ref 98–111)
Creatinine, Ser: 1.35 mg/dL — ABNORMAL HIGH (ref 0.61–1.24)
GFR, Estimated: >60 mL/min (ref >60)
Glucose, Bld: 150 mg/dL — ABNORMAL HIGH (ref 70–99)
Potassium: 3.1 mmol/L — ABNORMAL LOW (ref 3.5–5.1)
Sodium: 140 mmol/L (ref 135–145)

## 2021-04-29 LAB — URINALYSIS, ROUTINE W REFLEX MICROSCOPIC
Bacteria, UA: NONE SEEN
Bilirubin Urine: NEGATIVE
Glucose, UA: 50 mg/dL — AB
Ketones, ur: NEGATIVE mg/dL
Leukocytes,Ua: NEGATIVE
Nitrite: NEGATIVE
Protein, ur: >=300 mg/dL — AB
Specific Gravity, Urine: 1.014 (ref 1.005–1.030)
pH: 5 (ref 5.0–8.0)

## 2021-04-29 LAB — GLUCOSE, CAPILLARY
Glucose-Capillary: 183 mg/dL — ABNORMAL HIGH (ref 70–99)
Glucose-Capillary: 160 mg/dL — ABNORMAL HIGH (ref 70–99)

## 2021-04-29 LAB — CBG MONITORING, ED
Glucose-Capillary: 165 mg/dL — ABNORMAL HIGH (ref 70–99)
Glucose-Capillary: 155 mg/dL — ABNORMAL HIGH (ref 70–99)

## 2021-04-29 LAB — SURGICAL PCR SCREEN
MRSA, PCR: NEGATIVE
Staphylococcus aureus: NEGATIVE

## 2021-04-29 LAB — MAGNESIUM: Magnesium: 1.3 mg/dL — ABNORMAL LOW (ref 1.7–2.4)

## 2021-04-29 MED ORDER — VANCOMYCIN HCL 10 G IV SOLR
2500.0000 mg | Freq: Once | INTRAVENOUS | Status: AC
  Administered 2021-04-29: 2500 mg via INTRAVENOUS
  Filled 2021-04-29: qty 2500

## 2021-04-29 MED ORDER — SODIUM CHLORIDE 0.9 % IV SOLN
2.0000 g | Freq: Three times a day (TID) | INTRAVENOUS | Status: AC
Start: 2021-04-29 — End: 2021-05-03
  Administered 2021-04-29 – 2021-05-03 (×13): 2 g via INTRAVENOUS
  Filled 2021-04-29 (×14): qty 2

## 2021-04-29 MED ORDER — METOPROLOL SUCCINATE ER 50 MG PO TB24
50.0000 mg | ORAL_TABLET | Freq: Every day | ORAL | Status: DC
  Administered 2021-04-29 – 2021-05-01 (×3): 50 mg via ORAL
  Filled 2021-04-29 (×2): qty 1
  Filled 2021-04-29: qty 2

## 2021-04-29 MED ORDER — VANCOMYCIN HCL IN DEXTROSE 1-5 GM/200ML-% IV SOLN
1000.0000 mg | Freq: Two times a day (BID) | INTRAVENOUS | Status: AC
Start: 2021-04-29 — End: 2021-05-03
  Administered 2021-04-29 – 2021-05-03 (×8): 1000 mg via INTRAVENOUS
  Filled 2021-04-29 (×8): qty 200

## 2021-04-29 MED ORDER — POTASSIUM CHLORIDE CRYS ER 20 MEQ PO TBCR
40.0000 meq | EXTENDED_RELEASE_TABLET | Freq: Four times a day (QID) | ORAL | Status: AC
  Administered 2021-04-29 (×2): 40 meq via ORAL
  Filled 2021-04-29 (×2): qty 2

## 2021-04-29 MED ORDER — SODIUM CHLORIDE 0.9 % IV SOLN
INTRAVENOUS | Status: DC | PRN
  Administered 2021-04-29: 250 mL via INTRAVENOUS

## 2021-04-29 MED ORDER — FUROSEMIDE 10 MG/ML IJ SOLN
80.0000 mg | Freq: Two times a day (BID) | INTRAMUSCULAR | Status: DC
  Administered 2021-04-29 – 2021-05-04 (×10): 80 mg via INTRAVENOUS
  Filled 2021-04-29 (×11): qty 8

## 2021-04-29 NOTE — Progress Notes (Signed)
Pharmacy Antibiotic Note  Adam James is a 50 y.o. male admitted on 04/28/2021 with  osteomyelitis .  Pharmacy has been consulted for vancomycin and cefepime dosing.  Plan: Vancomycin 2500mg  x1 then 1000mg  IV q12h (per nomogram given BMI) Cefepime 2g IV q8h -Monitor renal function, clinical status, and antibiotic plan -Order vanc levels as necessary   Temp (24hrs), Avg:98.3 F (36.8 C), Min:98.2 F (36.8 C), Max:98.5 F (36.9 C)  Recent Labs  Lab 04/25/21 1517 04/28/21 1047 04/29/21 0500  WBC 6.9 8.6 6.8  CREATININE 1.35* 1.52* 1.35*  LATICACIDVEN  --  1.6  --     Estimated Creatinine Clearance: 94.3 mL/min (A) (by C-G formula based on SCr of 1.35 mg/dL (H)).    Allergies  Allergen Reactions   Morphine Itching    Antimicrobials this admission: Vanc 10/4 >> Cefepime 10/4 >>   Thank you for allowing pharmacy to be a part of this patient's care.  06/29/21, PharmD, [facility] Emergency Medicine Clinical Pharmacist ED RPh Phone: [phone] Main RX: [phone]

## 2021-04-29 NOTE — Progress Notes (Signed)
HD 1 SUBJECTIVE:  Patient Summary: Adam James is a 50 y.o. male with a pertinent PMH of CAD, diabetes, HFrEF (25%, 2022) who presented with a left foot wound with osteomyelitis and admitted for acute CHF and diabetic foot wound with osteomyelitis.  Overnight Events: None  Patient was seen this AM at bedside. [name] is very upset about the infection in his foot as he has been working hard on wound healing, as he wants to avoid a major amputation. He notes that it only began to have an odor yesterday at his appointment with Dr. Lajoyce Corners. He stated that has never had vascular testing in his legs.   [name] states he has been experiencing orthopnea that has not improved yet. He has also been experiencing a productive cough since March.   OBJECTIVE:  Vital Signs: Vitals:   04/29/21 1015 04/29/21 1220 04/29/21 1356 04/29/21 1610  BP: 124/70 116/76 110/79 109/71  Pulse: (!) 106 (!) 107 (!) 104 95  Resp: (!) 23 16 18 20   Temp:   98.3 F (36.8 C) 98.3 F (36.8 C)  TempSrc:   Oral Oral  SpO2: 95% 94% 96% 95%   Supplemental O2:  Room Air SpO2: 95 %  There were no vitals filed for this visit.   Intake/Output Summary (Last 24 hours) at 04/29/2021 1908 Last data filed at 04/29/2021 1854 Gross per 24 hour  Intake 898.68 ml  Output --  Net 898.68 ml    Physical Exam: Physical Exam Vitals and nursing note reviewed.  Constitutional:      General: He is not in acute distress. HENT:     Head: Normocephalic and atraumatic.  Cardiovascular:     Rate and Rhythm: Regular rhythm. Tachycardia present.     Pulses:          Dorsalis pedis pulses are 2+ on the right side and 1+ on the left side.  Pulmonary:     Effort: Pulmonary effort is normal.  Musculoskeletal:     Right lower leg: Swelling (Tense and up to mid calf) present. No tenderness. 2+ Pitting Edema (Down to foot, from above ankle) present.     Left lower leg: Swelling (To top of anterior shin) and deformity (Has  extensive scarring on anterior surface, that is healing. Has 2 areas of hyper-granulated tissues, see H&P for picture. No odor, drainage, tenderness, erythema) present. No tenderness. 3+ Pitting Edema (From mid calf to foot) present.     Left foot: Foot drop (Due to debridment and partial removal of anterior tibialis during surgical debridement) present.     Right Lower Extremity: (5th digit amputated) Feet:     Right foot:     Skin integrity: Warmth, callus and dry skin present.     Left foot:     Skin integrity: Ulcer, erythema (Noted on ventral surface, at the proximal end of toes. Most notable at medial surface of 5th digit), callus and dry skin present.     Comments: Ventral Left Foot, at 5th metatarsal, has ulcer that has no marginal erythema, red beefy granulation tissue overlaying ulcer, minimal bleeding, serous drainage, track to underlying bone, not tender to palpation. Skin:    Capillary Refill: Capillary refill takes more than 3 seconds.  Neurological:     Mental Status: He is alert and oriented to person, place, and time.     Tense edema on the bilateral lower extremities with erythematous flush, 4 sec cap refill.  4th metatarsal with callus on the plantar aspect.  Left DP pulses faint. Right DP pulse 2+  Pertinent Labs: CBC Latest Ref Rng & Units 04/29/2021 04/28/2021 04/25/2021  WBC 4.0 - 10.5 K/uL 6.8 8.6 6.9  Hemoglobin 13.0 - 17.0 g/dL 6.0(Y) 3.0(Z) 6.0(F)  Hematocrit 39.0 - 52.0 % 27.0(L) 29.9(L) 28.8(L)  Platelets 150 - 400 K/uL 184 227 226    CMP Latest Ref Rng & Units 04/29/2021 04/28/2021 04/25/2021  Glucose 70 - 99 mg/dL 093(A) 355(D) 322(G)  BUN 6 - 20 mg/dL 13 16 15   Creatinine 0.61 - 1.24 mg/dL ) 2.54(Y) 7.06(C)  Sodium 135 - 145 mmol/L 140 141 140  Potassium 3.5 - 5.1 mmol/L 3.1(L) 3.5 3.6  Chloride 98 - 111 mmol/L 105 106 111  CO2 22 - 32 mmol/L 26 25 20(L)  Calcium 8.9 - 10.3 mg/dL 7.9(L) 8.3(L) 8.5(L)  Total Protein 6.5 - 8.1 g/dL - 6.2(L) -  Total  Bilirubin 0.3 - 1.2 mg/dL - 0.7 -  Alkaline Phos 38 - 126 U/L - 72 -  AST 15 - 41 U/L - 12(L) -  ALT 0 - 44 U/L - 10 -   Recent Labs    04/29/21 0858 04/29/21 1231 04/29/21 1610  GLUCAP 165* 155* 183*   Pertinent Imaging: No results found.  ASSESSMENT/PLAN:  Assessment: Principal Problem:   Acute exacerbation of CHF (congestive heart failure) (HCC) Active Problems:   Diabetic foot ulcer (HCC)   CAD (coronary artery disease)   Osteomyelitis (HCC)  Adam James is a 50 y.o. male with a pertinent PMH of CAD, diabetes, HFrEF (25%, 2022) who presented with a left foot wound with osteomyelitis and admitted for acute CHF and diabetic foot wound with osteomyelitis. HD 1  Plan: #Acute on chronic congestive heart failure History NSTEMI August 2022 HFrEF (EF 25%, Aug 2022). Evidence of volume overload on physical exam seen above. Beta-blocker was initially held in the setting of acute heart failure, however during encounter patient was experienced rebound tachycardia.  We will restart metoprolol. We will continue to hold his spironolactone for now but this can likely be restarted if BP are stable. [facility]'s Criteria History of hypokalemia on Aldactone and potassium supplements. - Restart home metoprolol 50mg  - Strict I's and O's monitor daily weights. - Continue IV Lasix injection 80 mg.  Will monitor for response. - Daily BMP monitor for hypokalemia in the setting of diuresis   #Osteomyelitis of left fifth metatarsal head. Diabetic foot ulcer Patient failed outpatient doxycycline and Augmentin. Evaluated at orthopedic office and determined to have progression of osteomyelitis. Ortho is aware and planning for amputation on October 5. Patient is currently afebrile vital signs stable and no elevation in WBC.  On physical exam, noted some erythema overlying site of osteomyelitis.  We are concerned for possible complication such as myositis and cellulitis. Will start IV vancomycin and  cefepime. - IV vancomycin and cefepime, per pharmacy   #Chronic kidney disease GFR 56 creatinine 1.52 up from 1.3 4 days ago.  Likely related to diabetes and hypertension exacerbated by recent CHF exacerbation. Anticipate this will improve with diuresis and improved cardiac function Continue to monitor  Type 2 diabetes with complications including diabetic foot ulcer and polyneuropathy Sliding scale insulin and CBG checks.  Tobacco abuse. 3 pack/day since the age of 67 history.  Does not currently feel the urge to smoke.  Can give nicotine patch if necessary.  Iron-Deficient Anemia Hemoglobin 8.8 today. Asymptomatic.  Fe 27, TIBC 228. Ferritin normal at 250. This is likely due to current infection.  Will  encourage patient's to follow up with PCP and repeat iron panel as outpatient. - Transfuse as necessary, Hb <7 - Follow-up outpatient with CBC and repeat iron panel    Best Practice: Diet:  Carb/Renal IVF: None,None VTE: Heparin Code: Full Code  PT/OT recs: Pending, none. TOC recs: Pending  Prior to Admission Living Arrangement: Home, living with daughter Anticipated Discharge Location: Home Barriers to Discharge: Pending medical improvement  Dispo: Anticipated discharge in >2 days.  Signature: Princess Bruins, DO Psychiatry Resident, PGY-1 [facility] Internal [facility] Teaching Service Pager: [phone] 7:09 PM, 04/29/2021  Please contact the on call pager after 5 pm and on weekends at [phone].

## 2021-04-29 NOTE — ED Notes (Signed)
Received verbal report from Alexis D RN at this time 

## 2021-04-30 ENCOUNTER — Encounter (HOSPITAL_COMMUNITY): Payer: Self-pay | Admitting: Internal Medicine

## 2021-04-30 ENCOUNTER — Inpatient Hospital Stay (HOSPITAL_COMMUNITY): Payer: Self-pay | Admitting: Anesthesiology

## 2021-04-30 ENCOUNTER — Encounter (HOSPITAL_COMMUNITY)
Admission: EM | Disposition: A | Discharge status: Home Health | Payer: Self-pay | Source: Ambulatory Visit | Attending: Internal Medicine

## 2021-04-30 ENCOUNTER — Other Ambulatory Visit: Payer: Self-pay

## 2021-04-30 HISTORY — PX: AMPUTATION: SHX166

## 2021-04-30 LAB — BASIC METABOLIC PANEL
Anion gap: 9 (ref 5–15)
BUN: 16 mg/dL (ref 6–20)
CO2: 24 mmol/L (ref 22–32)
Calcium: 8 mg/dL — ABNORMAL LOW (ref 8.9–10.3)
Chloride: 104 mmol/L (ref 98–111)
Creatinine, Ser: 1.42 mg/dL — ABNORMAL HIGH (ref 0.61–1.24)
GFR, Estimated: >60 mL/min (ref >60)
Glucose, Bld: 153 mg/dL — ABNORMAL HIGH (ref 70–99)
Potassium: 3.8 mmol/L (ref 3.5–5.1)
Sodium: 137 mmol/L (ref 135–145)

## 2021-04-30 LAB — CBC WITH DIFFERENTIAL/PLATELET
Abs Immature Granulocytes: 0.01 10*3/uL (ref 0.00–0.07)
Basophils Absolute: 0 10*3/uL (ref 0.0–0.1)
Basophils Relative: 1 %
Eosinophils Absolute: 0.2 10*3/uL (ref 0.0–0.5)
Eosinophils Relative: 3 %
HCT: 27.8 % — ABNORMAL LOW (ref 39.0–52.0)
Hemoglobin: 8.8 g/dL — ABNORMAL LOW (ref 13.0–17.0)
Immature Granulocytes: 0 %
Lymphocytes Relative: 40 %
Lymphs Abs: 2.8 10*3/uL (ref 0.7–4.0)
MCH: 28.8 pg (ref 26.0–34.0)
MCHC: 31.7 g/dL (ref 30.0–36.0)
MCV: 90.8 fL (ref 80.0–100.0)
Monocytes Absolute: 0.5 10*3/uL (ref 0.1–1.0)
Monocytes Relative: 7 %
Neutro Abs: 3.4 10*3/uL (ref 1.7–7.7)
Neutrophils Relative %: 49 %
Platelets: 180 10*3/uL (ref 150–400)
RBC: 3.06 MIL/uL — ABNORMAL LOW (ref 4.22–5.81)
RDW: 14.8 % (ref 11.5–15.5)
WBC: 6.9 10*3/uL (ref 4.0–10.5)
nRBC: 0 % (ref 0.0–0.2)

## 2021-04-30 LAB — GLUCOSE, CAPILLARY
Glucose-Capillary: 149 mg/dL — ABNORMAL HIGH (ref 70–99)
Glucose-Capillary: 136 mg/dL — ABNORMAL HIGH (ref 70–99)
Glucose-Capillary: 141 mg/dL — ABNORMAL HIGH (ref 70–99)
Glucose-Capillary: 188 mg/dL — ABNORMAL HIGH (ref 70–99)
Glucose-Capillary: 223 mg/dL — ABNORMAL HIGH (ref 70–99)

## 2021-04-30 LAB — TYPE AND SCREEN
ABO/RH(D): A NEG
Antibody Screen: NEGATIVE

## 2021-04-30 LAB — MAGNESIUM: Magnesium: 1.3 mg/dL — ABNORMAL LOW (ref 1.7–2.4)

## 2021-04-30 SURGERY — AMPUTATION, FOOT, RAY
Anesthesia: Monitor Anesthesia Care | Laterality: Left

## 2021-04-30 MED ORDER — METOCLOPRAMIDE HCL 5 MG/ML IJ SOLN: 5.0000 mg | Freq: Three times a day (TID) | INTRAMUSCULAR | Status: DC | PRN

## 2021-04-30 MED ORDER — METOCLOPRAMIDE HCL 5 MG PO TABS: 5.0000 mg | ORAL_TABLET | Freq: Three times a day (TID) | ORAL | Status: DC | PRN

## 2021-04-30 MED ORDER — CHLORHEXIDINE GLUCONATE 0.12 % MT SOLN
OROMUCOSAL | Status: AC
  Filled 2021-04-30: qty 15

## 2021-04-30 MED ORDER — FENTANYL CITRATE (PF) 250 MCG/5ML IJ SOLN
INTRAMUSCULAR | Status: AC
  Filled 2021-04-30: qty 5

## 2021-04-30 MED ORDER — 0.9 % SODIUM CHLORIDE (POUR BTL) OPTIME
TOPICAL | Status: DC | PRN
  Administered 2021-04-30: 1000 mL

## 2021-04-30 MED ORDER — POLYETHYLENE GLYCOL 3350 17 G PO PACK: 17.0000 g | PACK | Freq: Every day | ORAL | Status: DC | PRN

## 2021-04-30 MED ORDER — PROPOFOL 10 MG/ML IV BOLUS
INTRAVENOUS | Status: DC | PRN
  Administered 2021-04-30 (×2): 20 mg via INTRAVENOUS

## 2021-04-30 MED ORDER — HYDROMORPHONE HCL 1 MG/ML IJ SOLN
0.5000 mg | INTRAMUSCULAR | Status: DC | PRN
  Administered 2021-05-05: 1 mg via INTRAVENOUS
  Filled 2021-04-30: qty 1

## 2021-04-30 MED ORDER — FENTANYL CITRATE (PF) 100 MCG/2ML IJ SOLN
INTRAMUSCULAR | Status: DC | PRN
  Administered 2021-04-30: 50 ug via INTRAVENOUS

## 2021-04-30 MED ORDER — MAGNESIUM SULFATE 2 GM/50ML IV SOLN
2.0000 g | Freq: Once | INTRAVENOUS | Status: AC
  Administered 2021-04-30: 2 g via INTRAVENOUS
  Filled 2021-04-30: qty 50

## 2021-04-30 MED ORDER — ORAL CARE MOUTH RINSE: 15.0000 mL | Freq: Once | OROMUCOSAL | Status: AC

## 2021-04-30 MED ORDER — OXYCODONE-ACETAMINOPHEN 5-325 MG PO TABS
1.0000 | ORAL_TABLET | ORAL | 0 refills | Status: DC | PRN
  Filled 2021-04-30: qty 30, 5d supply, fill #0

## 2021-04-30 MED ORDER — METHOCARBAMOL 500 MG PO TABS: 500.0000 mg | ORAL_TABLET | Freq: Four times a day (QID) | ORAL | Status: DC | PRN

## 2021-04-30 MED ORDER — OXYCODONE HCL 5 MG PO TABS
10.0000 mg | ORAL_TABLET | ORAL | Status: DC | PRN
  Administered 2021-05-05 – 2021-05-07 (×2): 10 mg via ORAL
  Filled 2021-04-30 (×3): qty 2

## 2021-04-30 MED ORDER — ACETAMINOPHEN 325 MG PO TABS: 325.0000 mg | ORAL_TABLET | Freq: Four times a day (QID) | ORAL | Status: DC | PRN

## 2021-04-30 MED ORDER — BISACODYL 10 MG RE SUPP: 10.0000 mg | Freq: Every day | RECTAL | Status: DC | PRN

## 2021-04-30 MED ORDER — ONDANSETRON HCL 4 MG/2ML IJ SOLN: 4.0000 mg | Freq: Four times a day (QID) | INTRAMUSCULAR | Status: DC | PRN

## 2021-04-30 MED ORDER — ONDANSETRON HCL 4 MG PO TABS: 4.0000 mg | ORAL_TABLET | Freq: Four times a day (QID) | ORAL | Status: DC | PRN

## 2021-04-30 MED ORDER — PROPOFOL 500 MG/50ML IV EMUL
INTRAVENOUS | Status: DC | PRN
  Administered 2021-04-30: 30 ug/kg/min via INTRAVENOUS

## 2021-04-30 MED ORDER — OXYCODONE HCL 5 MG PO TABS
5.0000 mg | ORAL_TABLET | ORAL | Status: DC | PRN
  Administered 2021-04-30 – 2021-05-04 (×13): 5 mg via ORAL
  Administered 2021-05-05 – 2021-05-06 (×3): 10 mg via ORAL
  Administered 2021-05-06: 5 mg via ORAL
  Filled 2021-04-30: qty 2
  Filled 2021-04-30 (×3): qty 1
  Filled 2021-04-30: qty 2
  Filled 2021-04-30 (×8): qty 1
  Filled 2021-04-30: qty 2
  Filled 2021-04-30: qty 1
  Filled 2021-04-30: qty 2
  Filled 2021-04-30: qty 1

## 2021-04-30 MED ORDER — CHLORHEXIDINE GLUCONATE 0.12 % MT SOLN
15.0000 mL | Freq: Once | OROMUCOSAL | Status: AC
  Administered 2021-04-30: 15 mL via OROMUCOSAL

## 2021-04-30 MED ORDER — METHOCARBAMOL 1000 MG/10ML IJ SOLN
500.0000 mg | Freq: Four times a day (QID) | INTRAVENOUS | Status: DC | PRN
  Filled 2021-04-30: qty 5

## 2021-04-30 MED ORDER — BUPIVACAINE HCL (PF) 0.25 % IJ SOLN
INTRAMUSCULAR | Status: DC | PRN
  Administered 2021-04-30: 30 mL

## 2021-04-30 MED ORDER — DOCUSATE SODIUM 100 MG PO CAPS
100.0000 mg | ORAL_CAPSULE | Freq: Two times a day (BID) | ORAL | Status: DC
  Administered 2021-05-02: 100 mg via ORAL
  Filled 2021-04-30 (×10): qty 1

## 2021-04-30 MED ORDER — LACTATED RINGERS IV SOLN: INTRAVENOUS | Status: DC

## 2021-04-30 MED ORDER — SODIUM CHLORIDE 0.9 % IV SOLN: INTRAVENOUS | Status: DC

## 2021-04-30 MED ORDER — DEXMEDETOMIDINE (PRECEDEX) IN NS 20 MCG/5ML (4 MCG/ML) IV SYRINGE
PREFILLED_SYRINGE | INTRAVENOUS | Status: DC | PRN
  Administered 2021-04-30 (×2): 8 ug via INTRAVENOUS

## 2021-04-30 MED ORDER — PHENYLEPHRINE 40 MCG/ML (10ML) SYRINGE FOR IV PUSH (FOR BLOOD PRESSURE SUPPORT)
PREFILLED_SYRINGE | INTRAVENOUS | Status: DC | PRN
  Administered 2021-04-30 (×2): 120 ug via INTRAVENOUS

## 2021-04-30 MED ORDER — ORAL CARE MOUTH RINSE: 15.0000 mL | Freq: Once | OROMUCOSAL | Status: DC

## 2021-04-30 MED ORDER — CHLORHEXIDINE GLUCONATE 0.12 % MT SOLN: 15.0000 mL | Freq: Once | OROMUCOSAL | Status: DC

## 2021-04-30 MED ORDER — BUPIVACAINE HCL (PF) 0.25 % IJ SOLN
INTRAMUSCULAR | Status: AC
  Filled 2021-04-30: qty 30

## 2021-04-30 SURGICAL SUPPLY — 39 items
BAG COUNTER SPONGE SURGICOUNT (BAG) ×2 IMPLANT
BLADE SAW SGTL MED 73X18.5 STR (BLADE) IMPLANT
BLADE SURG 21 STRL SS (BLADE) ×2 IMPLANT
BNDG COHESIVE 4X5 TAN STRL (GAUZE/BANDAGES/DRESSINGS) ×2 IMPLANT
BNDG COHESIVE 6X5 TAN NS LF (GAUZE/BANDAGES/DRESSINGS) ×1 IMPLANT
BNDG GAUZE ELAST 4 BULKY (GAUZE/BANDAGES/DRESSINGS) ×2 IMPLANT
CNTNR URN SCR LID CUP LEK RST (MISCELLANEOUS) IMPLANT
CONT SPEC 4OZ STRL OR WHT (MISCELLANEOUS) ×1
COVER SURGICAL LIGHT HANDLE (MISCELLANEOUS) ×4 IMPLANT
DRAPE INCISE IOBAN 66X45 STRL (DRAPES) ×1 IMPLANT
DRAPE U-SHAPE 47X51 STRL (DRAPES) ×4 IMPLANT
DRESSING PEEL AND PLC PRVNA 13 (GAUZE/BANDAGES/DRESSINGS) IMPLANT
DRESSING PREVENA PLUS CUSTOM (GAUZE/BANDAGES/DRESSINGS) IMPLANT
DRSG ADAPTIC 3X8 NADH LF (GAUZE/BANDAGES/DRESSINGS) ×2 IMPLANT
DRSG PAD ABDOMINAL 8X10 ST (GAUZE/BANDAGES/DRESSINGS) ×4 IMPLANT
DRSG PEEL AND PLACE PREVENA 13 (GAUZE/BANDAGES/DRESSINGS) ×2
DRSG PREVENA PLUS CUSTOM (GAUZE/BANDAGES/DRESSINGS) ×2
DURAPREP 26ML APPLICATOR (WOUND CARE) ×2 IMPLANT
ELECT REM PT RETURN 9FT ADLT (ELECTROSURGICAL) ×2
ELECTRODE REM PT RTRN 9FT ADLT (ELECTROSURGICAL) ×1 IMPLANT
GAUZE SPONGE 4X4 12PLY STRL (GAUZE/BANDAGES/DRESSINGS) ×2 IMPLANT
GLOVE SURG ORTHO LTX SZ9 (GLOVE) ×2 IMPLANT
GLOVE SURG UNDER POLY LF SZ9 (GLOVE) ×2 IMPLANT
GOWN STRL REUS W/ TWL XL LVL3 (GOWN DISPOSABLE) ×2 IMPLANT
GOWN STRL REUS W/TWL XL LVL3 (GOWN DISPOSABLE) ×2
KIT BASIN OR (CUSTOM PROCEDURE TRAY) ×2 IMPLANT
KIT DRSG PREVENA PLUS 7DAY 125 (MISCELLANEOUS) ×1 IMPLANT
KIT TURNOVER KIT B (KITS) ×2 IMPLANT
MICROMATRIX 500MG (Tissue) ×2 IMPLANT
NS IRRIG 1000ML POUR BTL (IV SOLUTION) ×2 IMPLANT
PACK ORTHO EXTREMITY (CUSTOM PROCEDURE TRAY) ×2 IMPLANT
PAD ARMBOARD 7.5X6 YLW CONV (MISCELLANEOUS) ×4 IMPLANT
SOLUTION PARTIC MCRMTRX 500MG (Tissue) IMPLANT
STOCKINETTE IMPERVIOUS LG (DRAPES) IMPLANT
SUT ETHILON 2 0 FS 18 (SUTURE) ×1 IMPLANT
SUT ETHILON 2 0 PSLX (SUTURE) ×2 IMPLANT
TOWEL GREEN STERILE (TOWEL DISPOSABLE) ×2 IMPLANT
TUBE CONNECTING 12X1/4 (SUCTIONS) ×2 IMPLANT
YANKAUER SUCT BULB TIP NO VENT (SUCTIONS) ×2 IMPLANT

## 2021-04-30 NOTE — TOC Progression Note (Addendum)
Transition of Care [facility]) - Progression Note    Patient Details  Name: Adam James MRN: [id number] Date of Birth: Oct 09, 1970  Transition of Care [facility]) CM/SW Contact  Leone Haven, RN Phone Number: 04/30/2021, 6:56 PM  Clinical Narrative:    Patient with previous admission, was getting [facility] for wound care from [facility]. Will need to see if he is still actively receiving [facility] , he has no insurance on file. He conts with wound on left leg, getting wet to dry dressing, for surgery today for toe amputation. He will need HHRN and HHPT charity. [facility] was supplying  wound supplies for him with the [facility]. He has also already utilized the [employer] this year.        Expected Discharge Plan and Services           Expected Discharge Date: 04/30/21                                     Social Determinants of Health (SDOH) Interventions    Readmission Risk Interventions No flowsheet data found.

## 2021-04-30 NOTE — Plan of Care (Signed)
  Problem: Education: Goal: Knowledge of General Education information will improve Description: Including pain rating scale, medication(s)/side effects and non-pharmacologic comfort measures Outcome: Progressing   Problem: Health Behavior/Discharge Planning: Goal: Ability to manage health-related needs will improve Outcome: Progressing   Problem: Clinical Measurements: Goal: Ability to maintain clinical measurements within normal limits will improve Outcome: Progressing   Problem: Activity: Goal: Risk for activity intolerance will decrease Outcome: Progressing   Problem: Coping: Goal: Level of anxiety will decrease Outcome: Progressing   Problem: Safety: Goal: Ability to remain free from injury will improve Outcome: Progressing   Problem: Pain Managment: Goal: General experience of comfort will improve Outcome: Progressing

## 2021-04-30 NOTE — Plan of Care (Signed)
  Problem: Health Behavior/Discharge Planning: Goal: Ability to manage health-related needs will improve Outcome: Progressing   Problem: Pain Managment: Goal: General experience of comfort will improve Outcome: Progressing   

## 2021-04-30 NOTE — Anesthesia Postprocedure Evaluation (Signed)
Anesthesia Post Note  Patient: Adam James  Procedure(s) Performed: LEFT FOOT 5TH RAY AMPUTATION APPLICATION OF A-CELL POWDER (Left)     Patient location during evaluation: PACU Anesthesia Type: MAC Level of consciousness: awake and alert Pain management: pain level controlled Vital Signs Assessment: post-procedure vital signs reviewed and stable Respiratory status: spontaneous breathing, nonlabored ventilation, respiratory function stable and patient connected to nasal cannula oxygen Cardiovascular status: stable and blood pressure returned to baseline Postop Assessment: no apparent nausea or vomiting Anesthetic complications: no   No notable events documented.  Last Vitals:  Vitals:   04/30/21 1245 04/30/21 1300  BP: 111/69   Pulse: 85   Resp: 17   Temp:  (!) 36.4 C  SpO2: 96%     Last Pain:  Vitals:   04/30/21 1300  TempSrc:   PainSc: 0-No pain                 Siyana Erney L Dai Mcadams

## 2021-04-30 NOTE — Progress Notes (Addendum)
HD#2 SUBJECTIVE:  Patient Summary: Adam James is a 50 y.o. male with a pertinent PMH of CAD, diabetes, HFrEF (25%, 2022) who presented with a left foot wound with osteomyelitis and admitted for acute CHF and diabetic foot wound with osteomyelitis.  Overnight Events: No acute events overnight  Interim History: This is hospital day 2 for Adam James who was seen and evaluated at the bedside this morning. He endorses frequent urination with diuresis, however denies any SOB, chest pain, palpitations. He states that the swelling in his legs is improved, although still present today. He understands the plan for amputation of his left 5th metatarsal today.   OBJECTIVE:  Vital Signs: Vitals:   04/29/21 1610 04/29/21 2041 04/30/21 0000 04/30/21 0422  BP: 109/71 116/75 112/83 118/81  Pulse: 95 98 92 96  Resp: '20 20 18 18  ' Temp: 98.3 F (36.8 C) 98.4 F (36.9 C) 98.2 F (36.8 C) 98 F (36.7 C)  TempSrc: Oral Oral Oral Oral  SpO2: 95% 92% 93% 95%  Weight:    116.2 kg   Supplemental O2: Room Air SpO2: 95 %  Filed Weights   04/30/21 0422  Weight: 116.2 kg    Intake/Output Summary (Last 24 hours) at 04/30/2021 0607 Last data filed at 04/30/2021 0432 Gross per 24 hour  Intake 1590.25 ml  Output 250 ml  Net 1340.25 ml   Net IO Since Admission: 1,340.25 mL [04/30/21 0607]  Physical Exam:  General: No acute distress. Head: Normocephalic. Atraumatic. CV: RRR. No murmurs, rubs, or gallops. 1+ bilateral LE pitting edema, improved Pulmonary: Lungs CTAB. Normal effort. No wheezing or rales. Abdominal: Soft, nontender, nondistended. Normal bowel sounds. Extremities: Palpable radial and DP pulses. Left sided foot drop. L 5th metatarsal ulcer is covered in gauze, not tender on palpation. Skin: Warm and dry. No obvious rash or lesions. Neuro: A&Ox3. Moves all extremities. Normal sensation. No focal deficit. Psych: Normal mood and affect  Patient Lines/Drains/Airways Status      Active Line/Drains/Airways     Name Placement date Placement time Site Days   Peripheral IV 04/28/21 22 G Left Hand 04/28/21  1844  Hand  2   Peripheral IV 04/29/21 20 G Left Antecubital 04/29/21  0307  Antecubital  1   Incision (Closed) 02/24/21 Leg Left 02/24/21  2326  -- 65   Wound / Incision (Open or Dehisced) 02/25/21 Diabetic ulcer Foot Right 02/25/21  0130  Foot  64   Wound / Incision (Open or Dehisced) 04/29/21 Pretibial Left 04/29/21  1700  Pretibial  1   Wound / Incision (Open or Dehisced) 04/29/21 Foot Left;Posterior;Proximal;Distal 04/29/21  1500  Foot  1             ASSESSMENT/PLAN:  Assessment: Principal Problem:   Acute exacerbation of CHF (congestive heart failure) (HCC) Active Problems:   Diabetic foot ulcer (HCC)   CAD (coronary artery disease)   Osteomyelitis (HCC)   Acute osteomyelitis of left foot ([place])   Plan: #Acute on chronic congestive heart failure History NSTEMI August 2022 HFrEF (EF 25%, Aug 2022). Evidence of volume overload on physical exam seen above, although improved compared to the day prior. I&Os may not accurately reflect the degree of diuresis but is noted that the patient's weight is down ~6 kg since 9/30. We will continue to hold his spironolactone for now but this can likely be restarted if BP are stable. - Continue home metoprolol 41m - Strict I's and O's, monitor daily weights. - Continue IV Lasix 80  mg BID - Daily BMP monitor for hypokalemia in the setting of diuresis   #Osteomyelitis of left fifth metatarsal head. Diabetic foot ulcer Patient remains afebrile & vital signs stable. No leukocytosis noted. Denies any pain in his left 5th metatarsal and the wound remains wrapped in gauze on exam.  - IV vancomycin and cefepime, per pharmacy - 5th metatarsal amputation today with Dr. Sharol Given, appreciate post-op recommendations   #AKI Creatinine stable at 1.42 today, baseline eGFR > 60,  worsened by recent CHF exacerbation. Anticipate  this will improve with further diuresis and improved cardiac function - Continue to monitor renal function  Type 2 diabetes with complications including diabetic foot ulcer and polyneuropathy Sliding scale insulin and CBG checks.  Tobacco abuse. 3 pack/day since the age of 62 history.  Does not currently feel the urge to smoke.  Can give nicotine patch if necessary.  Iron-Deficient Anemia Hemoglobin stable at 8.8 today.  - Transfuse as necessary, Hb <7 - Follow-up outpatient with CBC and repeat iron panel with PCP  Best Practice: Diet: Cardiac diet and Renal diet IVF: Fluids: none,  VTE: heparin injection 5,000 Units Start: 04/28/21 2200 Code: Full AB: Vanc and cefepime Therapy Recs: Pending DISPO: Anticipated discharge in 2-3 days to Home pending  clinical improvement .  Signature: Buddy Duty, D.O.  Internal Medicine Resident, PGY-1 [facility]  Pager: [phone] 6:07 AM, 04/30/2021   Please contact the on call pager after 5 pm and on weekends at [phone].

## 2021-04-30 NOTE — Interval H&P Note (Signed)
History and Physical Interval Note:  04/30/2021 11:06 AM  Adam James  has presented today for surgery, with the diagnosis of Osteomyelitis Left 5th Metatarsal Head.  The various methods of treatment have been discussed with the patient and family. After consideration of risks, benefits and other options for treatment, the patient has consented to  Procedure(s): LEFT FOOT 5TH RAY AMPUTATION (Left) as a surgical intervention.  The patient's history has been reviewed, patient examined, no change in status, stable for surgery.  I have reviewed the patient's chart and labs.  Questions were answered to the patient's satisfaction.     [name]

## 2021-04-30 NOTE — Consult Note (Addendum)
WOC Nurse Consult Note: Patient receiving care in [facility] 3E07 Reason for Consult: Left foot, left anterior tibia wound.  Place Xeroform gauze over the wound on the lower anterior aspect of the left leg and cover with a foam dressing.   This patient is scheduled for left foot fifth ray amputation today with Dr. Lajoyce Corners. Please re-consult if needed.  Renaldo Reel Katrinka Blazing, MSN, RN, CMSRN, Angus Seller, [facility] Wound Treatment Associate Pager [phone]

## 2021-04-30 NOTE — Anesthesia Preprocedure Evaluation (Addendum)
Anesthesia Evaluation  Patient identified by MRN, date of birth, ID band Patient awake    Reviewed: Allergy & Precautions, NPO status , Patient's Chart, lab work & pertinent test results, reviewed documented beta blocker date and time   Airway Mallampati: III  TM Distance: >3 FB Neck ROM: Full    Dental  (+) Loose, Dental Advisory Given,    Pulmonary neg pulmonary ROS, former smoker,    Pulmonary exam normal breath sounds clear to auscultation       Cardiovascular + angina + CAD, + Past MI and +CHF  Normal cardiovascular exam Rhythm:Regular Rate:Normal  TTE 2022 1. Left ventricular ejection fraction, by estimation, is 25%. The left  ventricle has severely decreased function. The left ventricle demonstrates  global hypokinesis. The left ventricular internal cavity size was mildly  dilated. Left ventricular  diastolic parameters are consistent with Grade II diastolic dysfunction  (pseudonormalization).  2. Right ventricular systolic function is mildly reduced. The right  ventricular size is normal. The estimated right ventricular systolic  pressure is 85.4 mmHg.  3. Left atrial size was moderately dilated.  4. The mitral valve is normal in structure. Mild mitral valve  regurgitation. No evidence of mitral stenosis.  5. The aortic valve is tricuspid. Aortic valve regurgitation is mild. No  aortic stenosis is present.  6. The inferior vena cava is dilated in size with <50% respiratory  variability, suggesting right atrial pressure of 15 mmHg.  7. There is a left-sided pleural effusion.   Cath 2022 .  Ost LAD to Mid LAD lesion is 90% stenosed. .  Mid LAD to Dist LAD lesion is 90% stenosed. Jorene Minors LAD lesion is 100% stenosed. .  Prox Cx lesion is 70% stenosed. .  Prox RCA to Mid RCA lesion is 100% stenosed. .  Dist RCA lesion is 100% stenosed. .  Ramus lesion is 70% stenosed. .  1st Mrg lesion is 50% stenosed. .  Mid  LM to Dist LM lesion is 60% stenosed.  Coronary angiography looks very similar to prior study in February.  Will review with Dr. Kipp Brood, plan for eventual CABG when leg lesions have healed    Neuro/Psych negative neurological ROS  negative psych ROS   GI/Hepatic negative GI ROS, Neg liver ROS,   Endo/Other  diabetes, Type 2, Oral Hypoglycemic Agents  Renal/GU Renal InsufficiencyRenal diseaseLab Results      Component                Value               Date                      CREATININE               1.42 (H)            04/30/2021                BUN                      16                  04/30/2021                NA                       137  04/30/2021                K                        3.8                 04/30/2021                CL                       104                 04/30/2021                CO2                      24                  04/30/2021             negative genitourinary   Musculoskeletal negative musculoskeletal ROS (+)   Abdominal   Peds  Hematology  (+) Blood dyscrasia, anemia , Lab Results      Component                Value               Date                      WBC                      6.9                 04/30/2021                HGB                      8.8 (L)             04/30/2021                HCT                      27.8 (L)            04/30/2021                MCV                      90.8                04/30/2021                PLT                      180                 04/30/2021              Anesthesia Other Findings Left foot wound with osteomyelitis  Reproductive/Obstetrics                           Anesthesia Physical Anesthesia Plan  ASA: 4  Anesthesia Plan: MAC   Post-op Pain Management:    Induction: Intravenous  PONV Risk Score and Plan: 1 and Propofol infusion, Treatment may vary  due to age or medical condition, Midazolam and Ondansetron  Airway Management  Planned: Natural Airway  Additional Equipment:   Intra-op Plan:   Post-operative Plan:   Informed Consent: I have reviewed the patients History and Physical, chart, labs and discussed the procedure including the risks, benefits and alternatives for the proposed anesthesia with the patient or authorized representative who has indicated his/her understanding and acceptance.     Dental advisory given  Plan Discussed with: CRNA  Anesthesia Plan Comments:        Anesthesia Quick Evaluation

## 2021-04-30 NOTE — Anesthesia Procedure Notes (Signed)
Procedure Name: MAC Date/Time: 04/30/2021 11:15 AM Performed by: Leonor Liv, CRNA Pre-anesthesia Checklist: Patient identified, Emergency Drugs available, Suction available, Patient being monitored and Timeout performed Patient Re-evaluated:Patient Re-evaluated prior to induction Oxygen Delivery Method: Simple face mask Placement Confirmation: positive ETCO2 Dental Injury: Teeth and Oropharynx as per pre-operative assessment

## 2021-04-30 NOTE — Consult Note (Signed)
ORTHOPAEDIC CONSULTATION  REQUESTING PHYSICIAN: Miguel Aschoff, MD  Chief Complaint: Osteomyelitis with draining purulent abscess left foot fifth metatarsal head.  HPI: Adam James is a 50 y.o. male who presents with osteomyelitis ulceration fifth metatarsal head left foot.  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  Patient's leg has progressed well however patient has had progressive infection and ulceration beneath the fifth metatarsal head with radiographs showing osteomyelitis.  Patient presented to the office with worsening congestive heart failure with increased swelling he was referred to the hospital for improvement of his volume status to proceed with amputation of the fifth ray.  Past Medical History:  Diagnosis Date   CAD (coronary artery disease)    Cardiomyopathy (HCC)    Diabetic foot ulcer (HCC)    Dyslipidemia    Heart failure (HCC)    Myocardial infarction (HCC)    Type 2 diabetes mellitus (HCC)    Past Surgical History:  Procedure Laterality Date   APPENDECTOMY     APPLICATION OF WOUND VAC Left 03/03/2021   Procedure: APPLICATION OF WOUND VAC;  Surgeon: Tarry Kos, MD;  Location: [facility] OR;  Service: Orthopedics;  Laterality: Left;   APPLICATION OF WOUND VAC  03/07/2021   Procedure: APPLICATION OF WOUND VAC;  Surgeon: [name], MD;  Location: [facility] OR;  Service: Orthopedics;;   CARDIAC CATHETERIZATION     COLON SURGERY     ESOPHAGOGASTRODUODENOSCOPY (EGD) WITH PROPOFOL N/A 03/12/2021   Procedure: ESOPHAGOGASTRODUODENOSCOPY (EGD) WITH PROPOFOL;  Surgeon: Jenel Lucks, MD;  Location: [facility]COPY;  Service: Gastroenterology;  Laterality: N/A;   I & D EXTREMITY Left 02/24/2021   Procedure: IRRIGATION AND DEBRIDEMENT EXTREMITY;  Surgeon: Myrene Galas, MD;  Location: [facility] OR;  Service: Orthopedics;  Laterality: Left;   I & D EXTREMITY Left 02/26/2021    Procedure: IRRIGATION AND DEBRIDEMENT OF LEG;  Surgeon: [name], MD;  Location: [facility] OR;  Service: Orthopedics;  Laterality: Left;   I & D EXTREMITY Left 03/03/2021   Procedure: LEFT LEG IRRIGATION AND DEBRIDEMENT;  Surgeon: Tarry Kos, MD;  Location: [facility] OR;  Service: Orthopedics;  Laterality: Left;   I & D EXTREMITY Left 03/07/2021   Procedure: REPEAT DEBRIDEMENT LEFT LEG;  Surgeon: [name], MD;  Location: [facility] OR;  Service: Orthopedics;  Laterality: Left;   IR FLUORO GUIDE CV LINE RIGHT  03/10/2021   IR REMOVAL TUN CV CATH W/O FL  03/24/2021   IR US GUIDE VASC ACCESS RIGHT  03/10/2021   LEFT HEART CATH AND CORONARY ANGIOGRAPHY N/A 09/13/2020   Procedure: LEFT HEART CATH AND CORONARY ANGIOGRAPHY;  Surgeon: Lennette Bihari, MD;  Location: [facility] INVASIVE CV LAB;  Service: Cardiovascular;  Laterality: N/A;   LEFT HEART CATH AND CORONARY ANGIOGRAPHY N/A 03/28/2021   Procedure: LEFT HEART CATH AND CORONARY ANGIOGRAPHY;  Surgeon: Laurey Morale, MD;  Location: [facility] INVASIVE CV LAB;  Service: Cardiovascular;  Laterality: N/A;   RIGHT HEART CATH N/A 03/26/2021   Procedure: RIGHT HEART CATH;  Surgeon: Laurey Morale, MD;  Location: [facility] INVASIVE CV LAB;  Service: Cardiovascular;  Laterality: N/A;   TOE AMPUTATION  2020   Social History   Socioeconomic History   Marital status: Single    Spouse name: Not on file   Number of children: 1   Years of education: Not on file   Highest education level: 8th grade  Occupational History  Occupation: none    Comment: former "[employer]"  Tobacco Use   Smoking status: Former    Packs/day: 3.00    Years: 36.00    Pack years: 108.00    Types: Cigarettes    Quit date: 08/27/2020    Years since quitting: 0.6   Smokeless tobacco: Never   Tobacco comments:    5-6 daily  Vaping Use   Vaping Use: Never used  Substance and Sexual Activity   Alcohol use: Not Currently    Comment: Very rare   Drug use: Not Currently    Types: Marijuana   Sexual activity:  Not on file  Other Topics Concern   Not on file  Social History Narrative   Not on file   Social Determinants of Health   Financial Resource Strain: High Risk   Difficulty of Paying Living Expenses: Very hard  Food Insecurity: No Food Insecurity   Worried About Programme researcher, broadcasting/film/video in the Last Year: Never true   Barista in the Last Year: Never true  Transportation Needs: Unmet Transportation Needs   Lack of Transportation (Medical): Yes   Lack of Transportation (Non-Medical): Yes  Physical Activity: Not on file  Stress: Not on file  Social Connections: Not on file   Family History  Problem Relation Age of Onset   Hypertension Mother    Heart failure Father    Hypertension Brother    Congestive Heart Failure Brother    - negative except otherwise stated in the family history section Allergies  Allergen Reactions   Morphine Itching   Prior to Admission medications   Medication Sig Start Date End Date Taking? Authorizing Provider  amLODipine (NORVASC) 5 MG tablet Take 1 tablet (5 mg total) by mouth daily. 02/20/21  Yes Baldo Daub, MD  aspirin EC 81 MG tablet Take 1 tablet (81 mg total) by mouth daily. Swallow whole. 02/20/21  Yes Baldo Daub, MD  atorvastatin (LIPITOR) 80 MG tablet Take 1 tablet (80 mg total) by mouth daily. 02/20/21  Yes Baldo Daub, MD  glyBURIDE (DIABETA) 5 MG tablet Take 5 mg by mouth 2 (two) times daily. 10/12/20  Yes [provider]  metFORMIN (GLUCOPHAGE) 500 MG tablet Take 500 mg by mouth 2 (two) times daily. 10/12/20  Yes [provider]  metoprolol succinate (TOPROL-XL) 50 MG 24 hr tablet Take 1 tablet (50 mg total) by mouth daily. Take with or immediately following a meal. 03/15/21 04/25/22 Yes Atway, Rayann N, DO  potassium chloride SA (KLOR-CON) 20 MEQ tablet Take 1 tablet (20 mEq total) by mouth daily. 04/25/21  Yes Milford, Anderson Malta, FNP  spironolactone (ALDACTONE) 25 MG tablet Take 0.5 tablets (12.5 mg total) by  mouth daily. 03/30/21 09/26/21 Yes Rehman, Areeg N, DO  torsemide (DEMADEX) 20 MG tablet Take 2 tablets (40 mg total) by mouth daily. 04/25/21  Yes Jacklynn Ganong, FNP   DG Foot Complete Left  Result Date: 04/28/2021 CLINICAL DATA:  Chronic wound over the fifth toe EXAM: LEFT FOOT - COMPLETE 3+ VIEW COMPARISON:  02/24/2021 FINDINGS: No acute fracture or dislocation. Generalized osteopenia. Soft tissue wound of the lateral aspect of the foot. Erosive changes involving the fifth metatarsal head and mild periostitis concerning for osteomyelitis. IMPRESSION: 1. Osteomyelitis of the fifth metatarsal head. Electronically Signed   By: Elige Ko M.D.   On: 04/28/2021 12:54   - pertinent xrays, CT, MRI studies were reviewed and independently interpreted  Positive ROS: All other systems  have been reviewed and were otherwise negative with the exception of those mentioned in the HPI and as above.  Physical Exam: General: Alert, no acute distress Psychiatric: Patient is competent for consent with normal mood and affect Lymphatic: No axillary or cervical lymphadenopathy Cardiovascular: No pedal edema Respiratory: No cyanosis, no use of accessory musculature GI: No organomegaly, abdomen is soft and non-tender    Images:  @ENCIMAGES @  Labs:  Lab Results  Component Value Date   HGBA1C 6.1 (H) 04/28/2021   HGBA1C 8.8 (H) 02/21/2021   ESRSEDRATE 25 (H) 03/14/2021   ESRSEDRATE >140 (H) 02/25/2021   CRP 1.6 (H) 03/14/2021   REPTSTATUS 03/05/2021 FINAL 02/26/2021   GRAMSTAIN  02/26/2021    FEW WBC PRESENT, PREDOMINANTLY PMN NO ORGANISMS SEEN    CULT  02/26/2021    RARE STREPTOCOCCUS AGALACTIAE TESTING AGAINST S. AGALACTIAE NOT ROUTINELY PERFORMED DUE TO PREDICTABILITY OF AMP/PEN/VAN SUSCEPTIBILITY. CRITICAL RESULT CALLED TO, READ BACK BY AND VERIFIED WITH: RN C.WELLS AT 1241 ON 02/27/2021 BY T.SAAD. RARE FINEGOLDIA MAGNA RARE CLOSTRIDIUM CLOSTRIDIOFORME CRITICAL RESULT CALLED TO, READ BACK BY  AND VERIFIED WITH: RN 04/29/2021 [phone] FCP Performed at [facility], [address], [place], [place]    LABORGA METHICILLIN RESISTANT STAPHYLOCOCCUS AUREUS 02/24/2021    Lab Results  Component Value Date   ALBUMIN 2.8 (L) 04/28/2021   ALBUMIN 1.7 (L) 03/24/2021   ALBUMIN 1.5 (L) 03/15/2021     CBC EXTENDED Latest Ref Rng & Units 04/30/2021 04/29/2021 04/28/2021  WBC 4.0 - 10.5 K/uL 6.9 6.8 8.6  RBC 4.22 - 5.81 MIL/uL 3.06(L) 2.99(L) 3.27(L)  HGB 13.0 - 17.0 g/dL 06/28/2021) 9.4(R) 7.4(Y)  HCT 39.0 - 52.0 % 27.8(L) 27.0(L) 29.9(L)  PLT 150 - 400 K/uL 180 184 227  NEUTROABS 1.7 - 7.7 K/uL 3.4 - 4.6  LYMPHSABS 0.7 - 4.0 K/uL 2.8 - 3.2    Neurologic: Patient does not have protective sensation bilateral lower extremities.   MUSCULOSKELETAL:   Skin: Examination patient has a purulent draining ulcer beneath the fifth metatarsal head left foot.  The ulcer probes to bone.  There is increased swelling in the right lower extremity.  The extensile incision from the foot to the knee of the left leg has healed well.  There is no cellulitis no drainage.  Patient previously had excellent pulses in the right foot I cannot palpate them secondary to swelling.  Patient's white blood cell count is 6.9 hemoglobin 8.8 albumin currently 2.8 up from 1.5 on his previous admission.  Patient's hemoglobin A1c is 6.1 sed rate 25 C-reactive protein 1.6.  Radiographs show destructive bony changes of the fifth metatarsal head left foot.  Assessment: Assessment: Osteomyelitis with purulent draining ulcer fifth metatarsal head left foot.  Plan: Plan for left foot fifth ray amputation today continue antibiotics postoperatively for 3 days touchdown weightbearing on the left postoperatively.  Risk and benefits were discussed including risk of the wound not healing need for additional surgery.  Patient states he understands wished to proceed at this time.  Thank you for the consult and the opportunity  to see Mr. Adonnis Salceda, MD [facility] [phone] 6:52 AM

## 2021-04-30 NOTE — Op Note (Signed)
04/30/2021  12:03 PM  PATIENT:  Adam James    PRE-OPERATIVE DIAGNOSIS:  Osteomyelitis Left 5th Metatarsal Head  POST-OPERATIVE DIAGNOSIS:  Same  PROCEDURE:  LEFT FOOT 5TH RAY  AMPUTATION APPLICATION OF A-CELL POWDER Local tissue rearrangement for wound closure 11 x 4 cm.  SURGEON:  Newt Minion, MD  PHYSICIAN ASSISTANT:None ANESTHESIA:   General  PREOPERATIVE INDICATIONS:  [name] is a  50 y.o. male with a diagnosis of Osteomyelitis Left 5th Metatarsal Head who failed conservative measures and elected for surgical management.    The risks benefits and alternatives were discussed with the patient preoperatively including but not limited to the risks of infection, bleeding, nerve injury, cardiopulmonary complications, the need for revision surgery, among others, and the patient was willing to proceed.  OPERATIVE IMPLANTS: 500 mg ACell powder  _0 @  OPERATIVE FINDINGS: No deep abscess tissue margins were clear at the amputation.  ACell powder added to both the incision and the hypergranulation tissue dorsum of the foot.  OPERATIVE PROCEDURE: Patient was brought the operating room and underwent a MAC anesthetic.  Patient's left lower extremity was then prepped using DuraPrep draped into a sterile field a timeout was called.  Patient underwent local anesthesia with 30 cc of quarter percent Marcaine plain.  After adequate levels anesthesia were obtained elliptical incision was made around the ulcer on the left foot and the fifth ray.  The ray was resected through the base with an oscillating saw.  This was resected back to healthy viable margins.  This left a wound was 11 x 4 cm.  The wound was irrigated normal saline electrocardio was used for hemostasis there was good petechial bleeding.  Local tissue rearrangement was used to close the wound 11 x 4 cm.  The hypergranulation tissue on the dorsum of the foot was removed with a 20 blade knife.  This was then covered with  ACell powder.  The wound of the ray amputation was also filled with the ACell powder.  Both wounds were covered with a customizable wound VAC sponge this had a good suction fit this was overwrapped with Coban patient was taken the PACU in stable condition   DISCHARGE PLANNING:  Antibiotic duration: Preoperative antibiotics  Weightbearing: Touchdown weightbearing in a cam boot  Pain medication: Prescription for Percocet  Dressing care/ Wound VAC: Continue wound VAC for 1 week  Ambulatory devices: Walker  Discharge to: Home.  Follow-up: In the office 1 week post operative.

## 2021-04-30 NOTE — Transfer of Care (Signed)
Immediate Anesthesia Transfer of Care Note  Patient: Adam James  Procedure(s) Performed: LEFT FOOT 5TH RAY AMPUTATION APPLICATION OF A-CELL POWDER (Left)  Patient Location: PACU  Anesthesia Type:MAC  Level of Consciousness: awake, alert  and oriented  Airway & Oxygen Therapy: Patient Spontanous Breathing and Patient connected to nasal cannula oxygen  Post-op Assessment: Report given to RN and Post -op Vital signs reviewed and stable  Post vital signs: Reviewed and stable  Last Vitals:  Vitals Value Taken Time  BP 111/67 04/30/21 1201  Temp    Pulse 85 04/30/21 1212  Resp 18 04/30/21 1212  SpO2 95 % 04/30/21 1212  Vitals shown include unvalidated device data.  Last Pain:  Vitals:   04/30/21 1011  TempSrc: Oral  PainSc:          Complications: No notable events documented.

## 2021-04-30 NOTE — H&P (View-Only) (Signed)
 ORTHOPAEDIC CONSULTATION  REQUESTING PHYSICIAN: Williams, Julie Anne, MD  Chief Complaint: Osteomyelitis with draining purulent abscess left foot fifth metatarsal head.  HPI: Adam James is a 49 y.o. male who presents with osteomyelitis ulceration fifth metatarsal head left foot.  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  Patient's leg has progressed well however patient has had progressive infection and ulceration beneath the fifth metatarsal head with radiographs showing osteomyelitis.  Patient presented to the office with worsening congestive heart failure with increased swelling he was referred to the hospital for improvement of his volume status to proceed with amputation of the fifth ray.  Past Medical History:  Diagnosis Date   CAD (coronary artery disease)    Cardiomyopathy (HCC)    Diabetic foot ulcer (HCC)    Dyslipidemia    Heart failure (HCC)    Myocardial infarction (HCC)    Type 2 diabetes mellitus (HCC)    Past Surgical History:  Procedure Laterality Date   APPENDECTOMY     APPLICATION OF WOUND VAC Left 03/03/2021   Procedure: APPLICATION OF WOUND VAC;  Surgeon: Xu, Naiping M, MD;  Location: [facility];  Service: Orthopedics;  Laterality: Left;   APPLICATION OF WOUND VAC  03/07/2021   Procedure: APPLICATION OF WOUND VAC;  Surgeon: Virgel Haro V, MD;  Location: [facility] OR;  Service: Orthopedics;;   CARDIAC CATHETERIZATION     COLON SURGERY     ESOPHAGOGASTRODUODENOSCOPY (EGD) WITH PROPOFOL N/A 03/12/2021   Procedure: ESOPHAGOGASTRODUODENOSCOPY (EGD) WITH PROPOFOL;  Surgeon: Cunningham, Scott E, MD;  Location: [facility];  Service: Gastroenterology;  Laterality: N/A;   I & D EXTREMITY Left 02/24/2021   Procedure: IRRIGATION AND DEBRIDEMENT EXTREMITY;  Surgeon: Handy, Anatole, MD;  Location: [facility];  Service: Orthopedics;  Laterality: Left;   I & D EXTREMITY Left 02/26/2021    Procedure: IRRIGATION AND DEBRIDEMENT OF LEG;  Surgeon: Letasha Kershaw V, MD;  Location: [facility];  Service: Orthopedics;  Laterality: Left;   I & D EXTREMITY Left 03/03/2021   Procedure: LEFT LEG IRRIGATION AND DEBRIDEMENT;  Surgeon: Xu, Naiping M, MD;  Location: [facility];  Service: Orthopedics;  Laterality: Left;   I & D EXTREMITY Left 03/07/2021   Procedure: REPEAT DEBRIDEMENT LEFT LEG;  Surgeon: Jamerica Snavely V, MD;  Location: [facility];  Service: Orthopedics;  Laterality: Left;   IR FLUORO GUIDE CV LINE RIGHT  03/10/2021   IR REMOVAL TUN CV CATH W/O FL  03/24/2021   IR US GUIDE VASC ACCESS RIGHT  03/10/2021   LEFT HEART CATH AND CORONARY ANGIOGRAPHY N/A 09/13/2020   Procedure: LEFT HEART CATH AND CORONARY ANGIOGRAPHY;  Surgeon: Kelly, Thomas A, MD;  [facility];  Service: Cardiovascular;  Laterality: N/A;   LEFT HEART CATH AND CORONARY ANGIOGRAPHY N/A 03/28/2021   Procedure: LEFT HEART CATH AND CORONARY ANGIOGRAPHY;  Surgeon: McLean, Dalton S, MD;  [facility];  Service: Cardiovascular;  Laterality: N/A;   RIGHT HEART CATH N/A 03/26/2021   Procedure: RIGHT HEART CATH;  Surgeon: McLean, Dalton S, MD;  [facility];  Service: Cardiovascular;  Laterality: N/A;   TOE AMPUTATION  2020   Social History   Socioeconomic History   Marital status: Single    Spouse name: Not on file   Number of children: 1   Years of education: Not on file   Highest education level: 8th grade  Occupational History     Occupation: none    Comment: former "[employer]"  Tobacco Use   Smoking status: Former    Packs/day: 3.00    Years: 36.00    Pack years: 108.00    Types: Cigarettes    Quit date: 08/27/2020    Years since quitting: 0.6   Smokeless tobacco: Never   Tobacco comments:    5-6 daily  Vaping Use   Vaping Use: Never used  Substance and Sexual Activity   Alcohol use: Not Currently    Comment: Very rare   Drug use: Not Currently    Types: Marijuana   Sexual activity:  Not on file  Other Topics Concern   Not on file  Social History Narrative   Not on file   Social Determinants of Health   Financial Resource Strain: High Risk   Difficulty of Paying Living Expenses: Very hard  Food Insecurity: No Food Insecurity   Worried About Running Out of Food in the Last Year: Never true   Ran Out of Food in the Last Year: Never true  Transportation Needs: Unmet Transportation Needs   Lack of Transportation (Medical): Yes   Lack of Transportation (Non-Medical): Yes  Physical Activity: Not on file  Stress: Not on file  Social Connections: Not on file   Family History  Problem Relation Age of Onset   Hypertension Mother    Heart failure Father    Hypertension Brother    Congestive Heart Failure Brother    - negative except otherwise stated in the family history section Allergies  Allergen Reactions   Morphine Itching   Prior to Admission medications   Medication Sig Start Date End Date Taking? Authorizing Provider  amLODipine (NORVASC) 5 MG tablet Take 1 tablet (5 mg total) by mouth daily. 02/20/21  Yes Munley, Brian J, MD  aspirin EC 81 MG tablet Take 1 tablet (81 mg total) by mouth daily. Swallow whole. 02/20/21  Yes Munley, Brian J, MD  atorvastatin (LIPITOR) 80 MG tablet Take 1 tablet (80 mg total) by mouth daily. 02/20/21  Yes Munley, Brian J, MD  glyBURIDE (DIABETA) 5 MG tablet Take 5 mg by mouth 2 (two) times daily. 10/12/20  Yes [provider]  metFORMIN (GLUCOPHAGE) 500 MG tablet Take 500 mg by mouth 2 (two) times daily. 10/12/20  Yes [provider]  metoprolol succinate (TOPROL-XL) 50 MG 24 hr tablet Take 1 tablet (50 mg total) by mouth daily. Take with or immediately following a meal. 03/15/21 04/25/22 Yes Atway, Rayann N, DO  potassium chloride SA (KLOR-CON) 20 MEQ tablet Take 1 tablet (20 mEq total) by mouth daily. 04/25/21  Yes Milford, Jessica M, FNP  spironolactone (ALDACTONE) 25 MG tablet Take 0.5 tablets (12.5 mg total) by  mouth daily. 03/30/21 09/26/21 Yes Rehman, Areeg N, DO  torsemide (DEMADEX) 20 MG tablet Take 2 tablets (40 mg total) by mouth daily. 04/25/21  Yes Milford, Jessica M, FNP   DG Foot Complete Left  Result Date: 04/28/2021 CLINICAL DATA:  Chronic wound over the fifth toe EXAM: LEFT FOOT - COMPLETE 3+ VIEW COMPARISON:  02/24/2021 FINDINGS: No acute fracture or dislocation. Generalized osteopenia. Soft tissue wound of the lateral aspect of the foot. Erosive changes involving the fifth metatarsal head and mild periostitis concerning for osteomyelitis. IMPRESSION: 1. Osteomyelitis of the fifth metatarsal head. Electronically Signed   By: Hetal  Patel M.D.   On: 04/28/2021 12:54   - pertinent xrays, CT, MRI studies were reviewed and independently interpreted  Positive ROS: All other systems   have been reviewed and were otherwise negative with the exception of those mentioned in the HPI and as above.  Physical Exam: General: Alert, no acute distress Psychiatric: Patient is competent for consent with normal mood and affect Lymphatic: No axillary or cervical lymphadenopathy Cardiovascular: No pedal edema Respiratory: No cyanosis, no use of accessory musculature GI: No organomegaly, abdomen is soft and non-tender    Images:  @ENCIMAGES @  Labs:  Lab Results  Component Value Date   HGBA1C 6.1 (H) 04/28/2021   HGBA1C 8.8 (H) 02/21/2021   ESRSEDRATE 25 (H) 03/14/2021   ESRSEDRATE >140 (H) 02/25/2021   CRP 1.6 (H) 03/14/2021   REPTSTATUS 03/05/2021 FINAL 02/26/2021   GRAMSTAIN  02/26/2021    FEW WBC PRESENT, PREDOMINANTLY PMN NO ORGANISMS SEEN    CULT  02/26/2021    RARE STREPTOCOCCUS AGALACTIAE TESTING AGAINST S. AGALACTIAE NOT ROUTINELY PERFORMED DUE TO PREDICTABILITY OF AMP/PEN/VAN SUSCEPTIBILITY. CRITICAL RESULT CALLED TO, READ BACK BY AND VERIFIED WITH: RN C.WELLS AT 1241 ON 02/27/2021 BY T.SAAD. RARE FINEGOLDIA MAGNA RARE CLOSTRIDIUM CLOSTRIDIOFORME CRITICAL RESULT CALLED TO, READ BACK BY  AND VERIFIED WITH: RN 04/29/2021 [phone] FCP Performed at [facility], [address], [place], [place]    LABORGA METHICILLIN RESISTANT STAPHYLOCOCCUS AUREUS 02/24/2021    Lab Results  Component Value Date   ALBUMIN 2.8 (L) 04/28/2021   ALBUMIN 1.7 (L) 03/24/2021   ALBUMIN 1.5 (L) 03/15/2021     CBC EXTENDED Latest Ref Rng & Units 04/30/2021 04/29/2021 04/28/2021  WBC 4.0 - 10.5 K/uL 6.9 6.8 8.6  RBC 4.22 - 5.81 MIL/uL 3.06(L) 2.99(L) 3.27(L)  HGB 13.0 - 17.0 g/dL 06/28/2021) 9.4(R) 7.4(Y)  HCT 39.0 - 52.0 % 27.8(L) 27.0(L) 29.9(L)  PLT 150 - 400 K/uL 180 184 227  NEUTROABS 1.7 - 7.7 K/uL 3.4 - 4.6  LYMPHSABS 0.7 - 4.0 K/uL 2.8 - 3.2    Neurologic: Patient does not have protective sensation bilateral lower extremities.   MUSCULOSKELETAL:   Skin: Examination patient has a purulent draining ulcer beneath the fifth metatarsal head left foot.  The ulcer probes to bone.  There is increased swelling in the right lower extremity.  The extensile incision from the foot to the knee of the left leg has healed well.  There is no cellulitis no drainage.  Patient previously had excellent pulses in the right foot I cannot palpate them secondary to swelling.  Patient's white blood cell count is 6.9 hemoglobin 8.8 albumin currently 2.8 up from 1.5 on his previous admission.  Patient's hemoglobin A1c is 6.1 sed rate 25 C-reactive protein 1.6.  Radiographs show destructive bony changes of the fifth metatarsal head left foot.  Assessment: Assessment: Osteomyelitis with purulent draining ulcer fifth metatarsal head left foot.  Plan: Plan for left foot fifth ray amputation today continue antibiotics postoperatively for 3 days touchdown weightbearing on the left postoperatively.  Risk and benefits were discussed including risk of the wound not healing need for additional surgery.  Patient states he understands wished to proceed at this time.  Thank you for the consult and the opportunity  to see Mr. Adam Salceda, MD [facility] [phone] 6:52 AM

## 2021-05-01 ENCOUNTER — Encounter (HOSPITAL_COMMUNITY): Payer: Self-pay | Admitting: Orthopedic Surgery

## 2021-05-01 ENCOUNTER — Encounter (HOSPITAL_COMMUNITY): Payer: Medicaid Other

## 2021-05-01 DIAGNOSIS — I2510 Atherosclerotic heart disease of native coronary artery without angina pectoris: Secondary | ICD-10-CM

## 2021-05-01 DIAGNOSIS — I5023 Acute on chronic systolic (congestive) heart failure: Secondary | ICD-10-CM

## 2021-05-01 LAB — BASIC METABOLIC PANEL
Anion gap: 10 (ref 5–15)
BUN: 16 mg/dL (ref 6–20)
CO2: 24 mmol/L (ref 22–32)
Calcium: 8 mg/dL — ABNORMAL LOW (ref 8.9–10.3)
Chloride: 103 mmol/L (ref 98–111)
Creatinine, Ser: 1.47 mg/dL — ABNORMAL HIGH (ref 0.61–1.24)
GFR, Estimated: 58 mL/min — ABNORMAL LOW (ref >60)
Glucose, Bld: 185 mg/dL — ABNORMAL HIGH (ref 70–99)
Potassium: 3.8 mmol/L (ref 3.5–5.1)
Sodium: 137 mmol/L (ref 135–145)

## 2021-05-01 LAB — GLUCOSE, CAPILLARY
Glucose-Capillary: 170 mg/dL — ABNORMAL HIGH (ref 70–99)
Glucose-Capillary: 190 mg/dL — ABNORMAL HIGH (ref 70–99)
Glucose-Capillary: 165 mg/dL — ABNORMAL HIGH (ref 70–99)
Glucose-Capillary: 168 mg/dL — ABNORMAL HIGH (ref 70–99)

## 2021-05-01 LAB — CBC
HCT: 27.9 % — ABNORMAL LOW (ref 39.0–52.0)
Hemoglobin: 8.8 g/dL — ABNORMAL LOW (ref 13.0–17.0)
MCH: 28.9 pg (ref 26.0–34.0)
MCHC: 31.5 g/dL (ref 30.0–36.0)
MCV: 91.5 fL (ref 80.0–100.0)
Platelets: 185 10*3/uL (ref 150–400)
RBC: 3.05 MIL/uL — ABNORMAL LOW (ref 4.22–5.81)
RDW: 14.6 % (ref 11.5–15.5)
WBC: 8.5 10*3/uL (ref 4.0–10.5)
nRBC: 0 % (ref 0.0–0.2)

## 2021-05-01 LAB — MAGNESIUM: Magnesium: 1.7 mg/dL (ref 1.7–2.4)

## 2021-05-01 MED ORDER — POTASSIUM CHLORIDE CRYS ER 20 MEQ PO TBCR: 40.0000 meq | EXTENDED_RELEASE_TABLET | Freq: Two times a day (BID) | ORAL | Status: DC

## 2021-05-01 MED ORDER — METOPROLOL SUCCINATE ER 25 MG PO TB24
25.0000 mg | ORAL_TABLET | Freq: Every day | ORAL | Status: DC
  Administered 2021-05-02 – 2021-05-04 (×3): 25 mg via ORAL
  Filled 2021-05-01 (×3): qty 1

## 2021-05-01 MED ORDER — ACETAMINOPHEN 500 MG PO TABS
1000.0000 mg | ORAL_TABLET | Freq: Three times a day (TID) | ORAL | Status: DC
  Administered 2021-05-01 – 2021-05-07 (×17): 1000 mg via ORAL
  Filled 2021-05-01 (×18): qty 2

## 2021-05-01 MED ORDER — METOLAZONE 2.5 MG PO TABS
2.5000 mg | ORAL_TABLET | Freq: Once | ORAL | Status: AC
  Administered 2021-05-01: 2.5 mg via ORAL
  Filled 2021-05-01: qty 1

## 2021-05-01 MED ORDER — MAGNESIUM SULFATE 4 GM/100ML IV SOLN
4.0000 g | Freq: Once | INTRAVENOUS | Status: AC
  Administered 2021-05-01: 4 g via INTRAVENOUS
  Filled 2021-05-01: qty 100

## 2021-05-01 MED ORDER — POTASSIUM CHLORIDE CRYS ER 20 MEQ PO TBCR
60.0000 meq | EXTENDED_RELEASE_TABLET | Freq: Two times a day (BID) | ORAL | Status: AC
  Administered 2021-05-01: 60 meq via ORAL
  Filled 2021-05-01 (×2): qty 3

## 2021-05-01 MED ORDER — SPIRONOLACTONE 12.5 MG HALF TABLET
12.5000 mg | ORAL_TABLET | Freq: Every day | ORAL | Status: DC
  Administered 2021-05-01 – 2021-05-04 (×4): 12.5 mg via ORAL
  Filled 2021-05-01 (×4): qty 1

## 2021-05-01 NOTE — Progress Notes (Signed)
Pharmacist Heart Failure Core Measure Documentation  Assessment: Adam James has an EF documented as 25% on 03/24/2021 by ECHO.   Rationale: Heart failure patients with left ventricular systolic dysfunction (LVSD) and an EF < 40% should be prescribed an angiotensin converting enzyme inhibitor (ACEI) or angiotensin receptor blocker (ARB) at discharge unless a contraindication is documented in the medical record.  This patient is not currently on an ACEI or ARB for HF.  This note is being placed in the record in order to provide documentation that a contraindication to the use of these agents is present for this encounter.  ACE Inhibitor or Angiotensin Receptor Blocker is contraindicated (specify all that apply)   TOC Heart Failure clinic is following patient and clinic notes on 04/25/21 said no Entresto for now with elevated SCr and to avoid SGLT2i with history of necrotizing fasciitis and poor hygiene.   []   ACEI allergy AND ARB allergy []   Angioedema []   Moderate or severe aortic stenosis []   Hyperkalemia []   Hypotension []   Renal artery stenosis [x]   Worsening renal function, preexisting renal disease or dysfunction.   AKI on CKC stage III  , Clinical Pharmacist [phone] 05/01/2021 12:25 PM  Please check AMION for all [facility] phone numbers After 10:00 PM, call Main Pharmacy [phone]

## 2021-05-01 NOTE — Progress Notes (Addendum)
HD#3 SUBJECTIVE:  Patient Summary: Adam James is a 50 y.o. male with a pertinent PMH of CAD, diabetes, HFrEF (25%, 2022) who presented with a left foot wound with osteomyelitis and admitted for acute CHF and diabetic foot wound with osteomyelitis.  Overnight Events: Hypotensive with BP 93/81  Interim History:  Adam James was seen and evaluated at the bedside this morning. States that he is feeling fine, but did not sleep well due to hospital disruptions. States that his SOB is also improved. He denies pain currently, as he just received pain medicine.  Discussed with patient having scheduled Tylenol to assist with pain, and reduce opioid use.  He was amenable, however discussed that he liked how the oxycodone helps him sleep and relieve his loose stool.  Reports having loose stool, about 4-5BM/day since August, that is unchanged. Denied hematochezia and dark tarry stools.  States that he has not brought it up with his PCP yet, encouraged him to follow up with an outpatient.  He denies feeling dizzy or lightheaded this morning BP was low.  He denies any SOB, chest pain, palpitations. He states that the swelling in his legs is improved, although still significant today.   OBJECTIVE:  Vital Signs: Vitals:   04/30/21 2001 05/01/21 0028 05/01/21 0347 05/01/21 0849  BP: 1_0 104/74  Pulse: 98 93 83 93  Resp:  _1 Temp: 97.7 F (36.5 C) 97.8 F (36.6 C) 98.1 F (36.7 C) 97.7 F (36.5 C)  TempSrc: Oral Oral Oral Oral  SpO2: 94% 93% 96% 95%  Weight:   116.5 kg    Supplemental O2:  Room Air SpO2: 95 % O2 Flow Rate (L/min): 2 L/min  Filed Weights   04/30/21 0422 05/01/21 0347  Weight: 116.2 kg 116.5 kg    Intake/Output Summary (Last 24 hours) at 05/01/2021 1616 Last data filed at 05/01/2021 1551 Gross per 24 hour  Intake 2085.48 ml  Output 1375 ml  Net 710.48 ml   Net IO Since Admission: 2,880.52 mL [05/01/21 1616]  Physical Exam: Physical  Exam Vitals and nursing note reviewed.  HENT:     Head: Normocephalic and atraumatic.  Cardiovascular:     Rate and Rhythm: Normal rate and regular rhythm.     Heart sounds: Normal heart sounds.  Pulmonary:     Effort: Pulmonary effort is normal.     Breath sounds: Rales (At lung bases bilaterally) present.  Abdominal:     Palpations: Abdomen is soft.  Musculoskeletal:        General: Swelling present.     Left lower leg: Edema (2+ pitting) present.     Comments: Left foot covered in wrapping with wound vac attached. Swelling present.  Skin:    General: Skin is warm and dry.  Neurological:     Mental Status: He is alert and oriented to person, place, and time.  Psychiatric:     Comments: Mood dysphoric     Patient Lines/Drains/Airways Status     Active Line/Drains/Airways     Name Placement date Placement time Site Days   Peripheral IV 04/28/21 22 G Left Hand 04/28/21  1844  Hand  2   Peripheral IV 04/29/21 20 G Left Antecubital 04/29/21  0307  Antecubital  1   Incision (Closed) 02/24/21 Leg Left 02/24/21  2326  -- 65   Wound / Incision (Open or Dehisced) 02/25/21 Diabetic ulcer Foot Right 02/25/21  0130  Foot  64   Wound / Incision (  Open or Dehisced) 04/29/21 Pretibial Left 04/29/21  1700  Pretibial  1   Wound / Incision (Open or Dehisced) 04/29/21 Foot Left;Posterior;Proximal;Distal 04/29/21  1500  Foot  1             ASSESSMENT/PLAN:  Assessment: Principal Problem:   Acute exacerbation of CHF (congestive heart failure) (HCC) Active Problems:   Diabetic foot ulcer (HCC)   CAD (coronary artery disease)   Osteomyelitis (HCC)   Acute osteomyelitis of left foot (HCC)   Plan: #Acute on chronic congestive heart failure History NSTEMI August 2022 HFrEF (EF 25%, Aug 2022). Evidence of volume overload on physical exam seen above. I&Os may not accurately reflect the degree of diuresis but is noted that the patient's weight is down ~6 kg since 9/30. We will continue  to hold his spironolactone for now but this can likely be restarted if BP are stable. Still hypotensive, consulted heart failure team, who recommended 1 dose of metolazone 2.5 mg, restarting home spironolactone, repleting K+ and Mg2+, and continue home metoprolol. Also on discharge, add Entresto if BP allows, and avoid SGLT2i with history of necrotizing fasciitis and poor hygiene. - Strict I's and O's, monitor daily weights. - Restart spironolactone 12.67m daily, per heart failure  - Can continue metoprolol 50 mg daily for now, per heart failure  - Daily BMP monitor for hypokalemia in the setting of diuresis   #Osteomyelitis of left fifth metatarsal head s/p fifth ray amputation (04/30/2021), POD 1 Diabetic foot ulcer 5th metatarsal ray amputation on 10/5 with Dr. DSharol Given Patient will f/u in the office 1 week post operative.  Patient remains afebrile & vital signs stable. No leukocytosis noted.  - IV vancomycin and cefepime for 3 more days (end 10/8), per pharmacy and ortho - Appreciate post-op recommendations - Continue wound VAC for 1 week postop, per ortho - Touchdown weightbearing in a cam boot, per ortho  #AKI Creatinine stable at 1.47 today, baseline eGFR > 60,  worsened by recent CHF exacerbation. Anticipate this will improve with further diuresis and improved cardiac function. - Continue to monitor renal function  Type 2 diabetes with complications including diabetic foot ulcer and polyneuropathy Sliding scale insulin and CBG checks.  Tobacco abuse. 3 pack/day since the age of 89history.  Does not currently feel the urge to smoke. Can give nicotine patch if necessary.  Iron-Deficient Anemia History of UGIB 2/2 erosive esophagitis in Aug 2022. Hemoglobin stable at 8.8 today.  - Transfuse as necessary, Hb <7 - Follow-up outpatient with CBC and repeat iron panel with PCP  Best Practice: Diet: Cardiac diet and Renal diet IVF: Fluids: none,  VTE: Place TED hose Start: 05/01/21  1441 SCDs Start: 04/30/21 1325 heparin injection 5,000 Units Start: 04/28/21 2200 Code: Full AB: Vanc and cefepime Therapy Recs: Pending DISPO: Anticipated discharge in 2-3 days to Home pending  clinical improvement .  Signature: JMerrily Brittle D.O.  Psychiatry Resident, PGY-1 [facility]  Pager: #[phone]:16 PM, 05/01/2021   Please contact the on call pager after 5 pm and on weekends at [phone]

## 2021-05-01 NOTE — Progress Notes (Signed)
PT Cancellation & Discharge Note  Patient Details Name: Adam James MRN: [id number] DOB: Aug 29, 1970   Cancelled Treatment:    Reason Eval/Treat Not Completed: Patient declined, no reason specified;PT screened, no needs identified, will sign off. Attempted to educate pt on weight bearing precautions and importance on compliance to allow wound to heal. Pt reporting "last time I had this surgery I was able to walk on it" and "well I will need to talk with the doctor then" when continuing to try to re-enforce new weight bearing precautions. Pt declining PT, stating "PT always pushes me and I do not like to be pushed", provided active listening and allowed pt to express his concerns and answer his questions. Pt continued to report he is able to care for himself and walk without assistance or need for any new devices and declining any acute PT. PT will sign off per pt request. Please re-consult PT if needed.   Raymond Gurney, PT, DPT Acute Rehabilitation Services  Pager: [phone] Office: [phone]    [name] 05/01/2021, 9:58 AM

## 2021-05-01 NOTE — Plan of Care (Signed)

## 2021-05-01 NOTE — TOC Initial Note (Addendum)
Transition of Care [facility]) - Initial/Assessment Note    Patient Details  Name: Adam James MRN: [id number] Date of Birth: [birth year]  [facility]) CM/SW Contact:    Dyana Magner, LCSWA Phone Number: 05/01/2021, 5:13 PM  Clinical Narrative:                 HF CSW spoke with Adam James at bedside and completed a very brief SDOH with him and he reported that he has a disability application pending and the Medicaid was denied and he is hoping the disability will get approved. CSW provided Adam James with a CAFA application filled out by the outpatient CSW and supporting documentation needed. Adam James reported having Food Stamps and that he has [facility] for his medications that typically come through the mail. Adam James doesn't have a PCP and isn't sure about getting a PCP until he has health insurance as he couldn't afford a $20 copay right now. Adam James reported his concerns about not being able to pay for the treatment he is receiving at the hospital and how that makes him feel. CSW provided active listening and support and will continue to follow Adam James throughout discharge.    Barriers to Discharge: Continued Medical Work up   Patient Goals and CMS Choice        Expected Discharge Plan and Services   In-house Referral: Clinical Social Work     Living arrangements for the past 2 months: Apartment Expected Discharge Date: 04/30/21                                    Prior Living Arrangements/Services Living arrangements for the past 2 months: Apartment Lives with:: Self, Adult Children Patient language and need for interpreter reviewed:: Yes        Need for Family Participation in Patient Care: No (Comment) Care giver support system in place?: No (comment)   Criminal Activity/Legal Involvement Pertinent to Current Situation/Hospitalization: No - Comment as needed  Activities of Daily Living      Permission Sought/Granted                   Emotional Assessment Appearance:: Appears stated age Attitude/Demeanor/Rapport: Engaged Affect (typically observed): Pleasant Orientation: : Oriented to Self, Oriented to Place, Oriented to  Time, Oriented to Situation   Psych Involvement: No (comment)  Admission diagnosis:  Acute exacerbation of CHF (congestive heart failure) (HCC) [I50.9] Acute osteomyelitis of left foot (HCC) [M86.172] Acute on chronic heart failure, unspecified heart failure type (HCC) [I50.9] Patient Active Problem List   Diagnosis Date Noted   Acute osteomyelitis of left foot (HCC)    Acute exacerbation of CHF (congestive heart failure) (HCC) 04/28/2021   Venous stasis ulcer of right calf limited to breakdown of skin without varicose veins (HCC)    Non-pressure chronic ulcer of other part of left foot limited to breakdown of skin (HCC)    Osteomyelitis (HCC)    Acute on chronic combined systolic and diastolic CHF (congestive heart failure) (HCC)    Acute on chronic heart failure (HCC) 03/23/2021   NSTEMI (non-ST elevated myocardial infarction) (HCC)    Gastroesophageal reflux disease with esophagitis and hemorrhage    Anemia, posthemorrhagic, acute    Acute esophagitis    Gastrointestinal hemorrhage    Necrotizing fasciitis (HCC)    Left leg cellulitis    Diabetic polyneuropathy associated with type 2 diabetes mellitus (HCC)  Severe protein-calorie malnutrition (HCC)    Gangrene of left foot (HCC) 02/25/2021   Type 2 diabetes mellitus (HCC)    Heart failure (HCC)    Dyslipidemia    Diabetic foot ulcer (HCC)    CAD (coronary artery disease)    Cardiomyopathy (HCC)    Coronary artery disease involving native coronary artery of native heart with angina pectoris (HCC)    PCP:  Patient, No Pcp Per (Inactive) Pharmacy:   [facility] [address] [place] [address] Phone: [phone] Fax: [phone]  [facility] [address] [place] [address] Phone: [phone] Fax: [phone]  [facility] [place] - [address], [place] [address] [place] [address] Phone: [phone] Fax: [phone]  [facility] [address] [place] [address] Phone: [phone] Fax: [phone]     Social Determinants of Health (SDOH) Interventions Food Insecurity Interventions: Other (Comment) (pt receives SNAP benefits at this time) Financial Strain Interventions: Artist, Other (Comment) (CAFA application provided, Disability pending with [employer]) Housing Interventions: Intervention Not Indicated Transportation Interventions: [facility]  Readmission Risk Interventions No flowsheet data found.  Lynnwood Beckford, MSW, LCSWA [phone] Heart Failure Social Worker

## 2021-05-01 NOTE — Progress Notes (Signed)
Patient ID: Adam James, male   DOB: Mar 10, 1971, 50 y.o.   MRN: [id number] Patient is postoperative day 1 left foot fifth ray amputation.  The wound VAC is functioning well there is a small amount of drainage in the canister.  I have ordered a cam boot walker for patient he may be weightbearing but minimize weightbearing activities to the left foot.  He may discharge when safe with therapy.  Discharge with the wound VAC I will follow-up in the office in 1 week.

## 2021-05-01 NOTE — Progress Notes (Signed)
Orthopedic Tech Progress Note Patient Details:  Adam James 12/25/1970 [id number]  Ortho Devices Type of Ortho Device: Lumbar corsett Ortho Device/Splint Interventions: Ordered      [name] [name] 05/01/2021, 3:05 PM

## 2021-05-01 NOTE — Consult Note (Addendum)
Advanced Heart Failure Team Consult Note   Primary Physician: Patient, No Pcp Per (Inactive) PCP-Cardiologist:  Norman Herrlich, MD  Reason for Consultation: A/c systolic HF  HPI:    Adam James is seen today for evaluation of a/c systolic HF at the request of Dr. Mayford Knife with Internal Medicine service.   He is a 50 y.o. male with history of CAD s/p PCI/stent 2007 in [place]. 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   Admitted to [facility] in 02/22 with lower extremity edema and foot ulcer. BNP and HS Trop elevated and felt to be non-ACS.  Cardiology consulted and he was placed on lasix.    He had follow up with Dr Dulce Sellar 09/09/20. Had cath with multivessel CAD. Saw Dr Cliffton Asters. He was referred to SW due to financial concerns and no payor source. CMRI showed EF 40% and viable myocardium.     Saw Dr Cliffton Asters in July and was set up for CABG in August. Pre CABG labs completed and showed WBC 25. He was advised to go to the ED.    Admitted 02/25/2021 with left foot infection complicated by osteomyelitis and necrotizing fasciitis. Multiple debridements for limb salvage. Hospital course complicated by erosive esophagitis on EGD. Recommendations for protonix 40 mg bid + repeat EGD in 8 weeks. Received multiple transfusions. Discharged 03/15/21.    Admitted 03/23/21 with A/C CHF exacerbation. Diuresed with IV lasix. Echo on 8/28 with LVEF of 25%. RHC 03/26/21 with preserved CO, mildly elevated PCWP, and mild pulmonary venous hypertension. LHC 03/28/21 showed severe multivessel CAD, minimal change from prior study in 2/22. Hospitalization c/b AKI on CKD3b. Ortho/ID consulted. Wounds healing without s/s infection. ID recommended to continue doxycycline + augmentin.   Seen in HF clinic 09/30. Had not been taking Torsemide after discharge, had been using an old supply of furosemide.  Weight 122 Kg, had been 113 kg at discharge.  Torsemide restarted at 40 mg BID X 3 days then 40 mg daily.   He saw Dr. Lajoyce Corners in clinic on 10/03. Concern for developing osteomyelitis of left 5th metatarsal head. Left foot wound appeared to be okay. Appeared volume overloaded. Admission recommended for management of a/c HF as well as amputation of left 5th metatarsal. He was started on IV antibiotics underwent amputation of left foot 5th ray by Dr. Lajoyce Corners on 10/05. Diuresing with IV lasix 80 mg BID. Metoprolol had been held but home dose later restarted d/t concern for rebound tachycardia.   He is net + 2.4L since admit. No change in weight from yesterday. Weight is down 6 kg from clinic visit last week. LE edema improved but has not resolved. Still had some orthopnea the other night. No dyspnea at rest.    Review of Systems: [y] = yes, [ ]  = no   General: Weight gain [Y]; Weight loss [ ] ; Anorexia [ ] ; Fatigue [ ] ; Fever [ ] ; Chills [ ] ; Weakness [ ]   Cardiac: Chest pain/pressure [ ] ; Resting SOB [ ] ; Exertional SOB [ ] ; Orthopnea [Y]; Pedal Edema [Y]; Palpitations [ ] ; Syncope [ ] ; Presyncope [ ] ; Paroxysmal nocturnal dyspnea[ ]   Pulmonary: Cough [ ] ; Wheezing[ ] ; Hemoptysis[ ] ; Sputum [ ] ; Snoring [ ]   GI: Vomiting[ ] ; Dysphagia[ ] ; Melena[ ] ; Hematochezia [ ] ; Heartburn[ ] ; Abdominal pain [ ] ; Constipation [ ] ; Diarrhea [Y]; BRBPR [ ]   GU: Hematuria[ ] ; Dysuria [ ] ; Nocturia[ ]   Vascular: Pain  in legs with walking [ ] ; Pain in feet with lying flat [ ] ; Non-healing sores [Y]; Stroke [ ] ; TIA [ ] ; Slurred speech [ ] ;  Neuro: Headaches[ ] ; Vertigo[ ] ; Seizures[ ] ; Paresthesias[ ] ;Blurred vision [ ] ; Diplopia [ ] ; Vision changes [ ]   Ortho/Skin: Arthritis [ ] ; Joint pain [ ] ; Muscle pain [ ] ; Joint swelling [ ] ; Back Pain [ ] ; Rash [ ]   Psych: Depression[ ] ; Anxiety[ ]   Heme: Bleeding problems [ ] ; Clotting disorders [ ] ; Anemia [Y]  Endocrine: Diabetes [Y]; Thyroid dysfunction[ ]   Home  Medications Prior to Admission medications   Medication Sig Start Date End Date Taking? Authorizing Provider  amLODipine (NORVASC) 5 MG tablet Take 1 tablet (5 mg total) by mouth daily. 02/20/21  Yes , MD  aspirin EC 81 MG tablet Take 1 tablet (81 mg total) by mouth daily. Swallow whole. 02/20/21  Yes , MD  atorvastatin (LIPITOR) 80 MG tablet Take 1 tablet (80 mg total) by mouth daily. 02/20/21  Yes , MD  glyBURIDE (DIABETA) 5 MG tablet Take 5 mg by mouth 2 (two) times daily. 10/12/20  Yes [provider]  metFORMIN (GLUCOPHAGE) 500 MG tablet Take 500 mg by mouth 2 (two) times daily. 10/12/20  Yes [provider]  metoprolol succinate (TOPROL-XL) 50 MG 24 hr tablet Take 1 tablet (50 mg total) by mouth daily. Take with or immediately following a meal. 03/15/21 04/25/22 Yes Atway, Rayann N, DO  potassium chloride SA (KLOR-CON) 20 MEQ tablet Take 1 tablet (20 mEq total) by mouth daily. 04/25/21  Yes Milford, , FNP  spironolactone (ALDACTONE) 25 MG tablet Take 0.5 tablets (12.5 mg total) by mouth daily. 03/30/21 09/26/21 Yes Rehman, Areeg N, DO  torsemide (DEMADEX) 20 MG tablet Take 2 tablets (40 mg total) by mouth daily. 04/25/21  Yes Milford, , FNP    Past Medical History: Past Medical History:  Diagnosis Date   CAD (coronary artery disease)    Cardiomyopathy (HCC)    Diabetic foot ulcer (HCC)    Dyslipidemia    Heart failure (HCC)    Myocardial infarction (HCC)    Type 2 diabetes mellitus (HCC)     Past Surgical History: Past Surgical History:  Procedure Laterality Date   APPENDECTOMY     APPLICATION OF WOUND VAC Left 03/03/2021   Procedure: APPLICATION OF WOUND VAC;  Surgeon: Baldo Daub, MD;  Location: [facility] OR;  Service: Orthopedics;  Laterality: Left;   APPLICATION OF WOUND VAC  03/07/2021   Procedure: APPLICATION OF WOUND VAC;  Surgeon: Baldo Daub, MD;  Location: [facility] OR;  Service: Orthopedics;;   CARDIAC  CATHETERIZATION     COLON SURGERY     ESOPHAGOGASTRODUODENOSCOPY (EGD) WITH PROPOFOL N/A 03/12/2021   Procedure: ESOPHAGOGASTRODUODENOSCOPY (EGD) WITH PROPOFOL;  Surgeon: Baldo Daub, MD;  Location[facility] ENDOSCOPY;  Service: Gastroenterology;  Laterality: N/A;   I & D EXTREMITY Left 02/24/2021   Procedure: IRRIGATION AND DEBRIDEMENT EXTREMITY;  Surgeon: 03/17/21, MD;  Location: [facility] OR;  Service: Orthopedics;  Laterality: Left;   I & D EXTREMITY Left 02/26/2021   Procedure: IRRIGATION AND DEBRIDEMENT OF LEG;  Surgeon: Anderson Malta, MD;  Location: [facility] OR;  Service: Orthopedics;  Laterality: Left;   I & D EXTREMITY Left 03/03/2021   Procedure: LEFT LEG IRRIGATION AND DEBRIDEMENT;  Surgeon: 04/27/21, MD;  Location: [facility] OR;  Service: Orthopedics;  Laterality: Left;   I & D  EXTREMITY Left 03/07/2021   Procedure: REPEAT DEBRIDEMENT LEFT LEG;  Surgeon: [name], MD;  Location: [facility] OR;  Service: Orthopedics;  Laterality: Left;   IR FLUORO GUIDE CV LINE RIGHT  03/10/2021   IR REMOVAL TUN CV CATH W/O FL  03/24/2021   IR US GUIDE VASC ACCESS RIGHT  03/10/2021   LEFT HEART CATH AND CORONARY ANGIOGRAPHY N/A 09/13/2020   Procedure: LEFT HEART CATH AND CORONARY ANGIOGRAPHY;  Surgeon: Lennette Bihari, MD;  Location: [facility] INVASIVE CV LAB;  Service: Cardiovascular;  Laterality: N/A;   LEFT HEART CATH AND CORONARY ANGIOGRAPHY N/A 03/28/2021   Procedure: LEFT HEART CATH AND CORONARY ANGIOGRAPHY;  Surgeon: Laurey Morale, MD;  Location: [facility] INVASIVE CV LAB;  Service: Cardiovascular;  Laterality: N/A;   RIGHT HEART CATH N/A 03/26/2021   Procedure: RIGHT HEART CATH;  Surgeon: Laurey Morale, MD;  Location: [facility] INVASIVE CV LAB;  Service: Cardiovascular;  Laterality: N/A;   TOE AMPUTATION  2020    Family History: Family History  Problem Relation Age of Onset   Hypertension Mother    Heart failure Father    Hypertension Brother    Congestive Heart Failure Brother     Social History: Social History    Socioeconomic History   Marital status: Single    Spouse name: Not on file   Number of children: 1   Years of education: Not on file   Highest education level: 8th grade  Occupational History   Occupation: none    Comment: former "[employer]"  Tobacco Use   Smoking status: Former    Packs/day: 3.00    Years: 36.00    Pack years: 108.00    Types: Cigarettes    Quit date: 08/27/2020    Years since quitting: 0.6   Smokeless tobacco: Never   Tobacco comments:    5-6 daily  Vaping Use   Vaping Use: Never used  Substance and Sexual Activity   Alcohol use: Not Currently    Comment: Very rare   Drug use: Not Currently    Types: Marijuana   Sexual activity: Not on file  Other Topics Concern   Not on file  Social History Narrative   Not on file   Social Determinants of Health   Financial Resource Strain: High Risk   Difficulty of Paying Living Expenses: Very hard  Food Insecurity: No Food Insecurity   Worried About Programme researcher, broadcasting/film/video in the Last Year: Never true   Ran Out of Food in the Last Year: Never true  Transportation Needs: Unmet Transportation Needs   Lack of Transportation (Medical): Yes   Lack of Transportation (Non-Medical): Yes  Physical Activity: Not on file  Stress: Not on file  Social Connections: Not on file    Allergies:  Allergies  Allergen Reactions   Morphine Itching    Objective:    Vital Signs:   Temp:  [97.5 F (36.4 C)-98.6 F (37 C)] 97.7 F (36.5 C) (10/06 0849) Pulse Rate:  [83-98] 93 (10/06 0849) Resp:  [16-20] 17 (10/06 0849) BP: (93-117)/(64-85) 104/74 (10/06 0849) SpO2:  [92 %-96 %] 95 % (10/06 0849) Weight:  [116.5 kg] 116.5 kg (10/06 0347) Last BM Date: 04/30/21  Weight change: Filed Weights   04/30/21 0422 05/01/21 0347  Weight: 116.2 kg 116.5 kg    Intake/Output:   Intake/Output Summary (Last 24 hours) at 05/01/2021 1208 Last data filed at 05/01/2021 1122 Gross per 24 hour  Intake 2179.48 ml  Output 1375 ml  Net  804.48 ml      Physical Exam    General:  Appears older than stated age. No distress.  HEENT: normal Neck: supple. JVD 10-12 cm . Carotids 2+ bilat; no bruits. No lymphadenopathy or thyromegaly appreciated. Cor: PMI nondisplaced. Regular rate & rhythm. No rubs, gallops or murmurs. Lungs: clear Abdomen: soft, nontender, nondistended. No hepatosplenomegaly. No bruits or masses. Good bowel sounds. Extremities: no cyanosis, clubbing, rash, edema Neuro: alert & orientedx3, cranial nerves grossly intact. moves all 4 extremities w/o difficulty. Affect pleasant   Telemetry   Sinus with frequent PVCs, 20-25/min  EKG    Sinus tachycardia, 109 bpm, PVCs  Labs   Basic Metabolic Panel: Recent Labs  Lab 04/25/21 1517 04/28/21 1047 04/29/21 0500 04/30/21 0328 05/01/21 0239  NA 140 141 140 137 137  K 3.6 3.5 3.1* 3.8 3.8  CL 111 106 105 104 103  CO2 20* 25 26 24 24   GLUCOSE 124* 162* 150* 153* 185*  BUN 15 16 13 16 16   CREATININE 1.35* 1.52* 1.35* 1.42* 1.47*  CALCIUM 8.5* 8.3* 7.9* 8.0* 8.0*  MG  --   --  1.3* 1.3* 1.7    Liver Function Tests: Recent Labs  Lab 04/28/21 1047  AST 12*  ALT 10  ALKPHOS 72  BILITOT 0.7  PROT 6.2*  ALBUMIN 2.8*   No results for input(s): LIPASE, AMYLASE in the last 168 hours. No results for input(s): AMMONIA in the last 168 hours.  CBC: Recent Labs  Lab 04/25/21 1517 04/28/21 1047 04/29/21 0500 04/30/21 0328 05/01/21 0239  WBC 6.9 8.6 6.8 6.9 8.5  NEUTROABS  --  4.6  --  3.4  --   HGB 9.0* 9.4* 8.8* 8.8* 8.8*  HCT 28.8* 29.9* 27.0* 27.8* 27.9*  MCV 92.0 91.4 90.3 90.8 91.5  PLT 226 227 184 180 185    Cardiac Enzymes: No results for input(s): CKTOTAL, CKMB, CKMBINDEX, TROPONINI in the last 168 hours.  BNP: BNP (last 3 results) Recent Labs    03/23/21 0045 04/25/21 1505 04/28/21 1047  BNP 1,129.9* 1,691.7* 1,072.2*    ProBNP (last 3 results) Recent Labs    10/07/20 1004  PROBNP 468*     CBG: Recent Labs  Lab  04/30/21 1209 04/30/21 1554 04/30/21 2129 05/01/21 0616 05/01/21 1120  GLUCAP 141* 188* 223* 170* 190*    Coagulation Studies: No results for input(s): LABPROT, INR in the last 72 hours.   Imaging   No results found.   Medications:     Current Medications:  aspirin EC  81 mg Oral Daily   atorvastatin  80 mg Oral q1800   docusate sodium  100 mg Oral BID   furosemide  80 mg Intravenous BID   heparin  5,000 Units Subcutaneous Q8H   insulin aspart  0-15 Units Subcutaneous TID WC   metoprolol succinate  50 mg Oral Daily   sodium chloride flush  3 mL Intravenous Q12H    Infusions:  sodium chloride Stopped (04/30/21 1000)   sodium chloride 10 mL/hr at 04/30/21 2120   ceFEPime (MAXIPIME) IV Stopped (05/01/21 1012)   methocarbamol (ROBAXIN) IV     vancomycin Stopped (05/01/21 1152)      Patient Profile   Adam James is a 50 year old male with history of chronic systolic HF/ischemic CM, multivessel CAD, stage III CKD, type II DM, left leg diabetic foot infection.  Currently admitted with osteomyelitis left 5th metatarsal and a/c systolic HF.  Assessment/Plan  1. Acute on Chronic systolic  CHF:  -With prior concern for low output HF.  Ischemic cardiomyopathy.  ? If frequent PVCs also contributing.  -Initial echo in 2/22 EF 40-45%.  Echo in 7/22 EF 30-35%.  Echo in 8/22 EF down to 25% with mild RV dysfunction.   -Admit 08/22 with a/c CHF.  Co-ox off tunneled catheter (which was removed) was 54.8% on 8/29, suggesting at least borderline low output.  RHC 8/31 with preserved CO, mildly elevated PCWP, mild pulmonary hypertension.   -Torsemide recently restarted in outpatient setting after he had not been taking at home. Had been taking an old supply of furosemide. -NYHA IIIb. Volume overloaded. On 80 mg lasix IV BID. No weight loss overnight, but down 6 kg from clinic visit last week. Net + 2.9L since admit. Will give 2.5 mg metolazone once. Responded well to metolazone last  admit. -K 3.8, mag 1.7 - replace both aggressively. BMET daily. -Restart spironolactone 12.5 daily.  -Can continue Toprol XL 50 mg daily for now -Add Entresto prior to discharge if BP allows -Avoid SGLT2i with history of necrotizing fasciitis and poor hygiene.  2. CAD:  -LHC in 2/22 with 50% dLM, 70% ramus, 70% pLCx, totally occluded proximal RCA, 90% stenosis ostial to mid LAD, 90% mid LAD, totally occluded distal LAD.  Cardiac MRI in 4/22 showed EF 40% with substantial viable myocardium, only delayed enhancement was subendocardial in the mid to apical inferior wall.   -CABG had been planned in 8/22, but patient was admitted left foot diabetic foot infection that progressed to necrotizing fasciitis s/p debridement and IV abx.  Now in with left 5th metatarsal osteomyelitis and still has healing surgical wound left foot/lower leg. Ideally, would have CABG.  However, need to have further healing of the left lower leg.   -Has outpatient f/u with CT surgery later this month.  -Repeat coronary angiography (9/22) showed minimal change from prior study in 2/22, LVEDP 16. -Continue ASA 81 daily.  -Continue atorvastatin 80 mg daily.   3. Frequent PVCs: -Reviewed telemetry, up to 20-25/min  -Continue beta blocker -Keep K > 4 and Mag > 2  4. CKD, Stage 3a: -Scr had been in 2s, now most recently 1.3-1.5. Monitor with diuresis  5. GI bleeding:  -History of erosive esophagitis earlier in 8/22.  Had been started on protonix during prior admit but no longer taking. - Hgb stable at 8.8  6. Left leg diabetic foot infection => necrotizing fasciitis in August 2022 -Currently admitted with left 5th metatarsal osteomyelitis s/p amputation. On IV antibiotics.  -Prior surgical incision LLE healing.  7. DM2:  -Per primary team  8. SDOH:  -He has filled out paperwork for disability and medicaid. Difficulty paying for medications. -Mother helps with transportation to appts, has been given [facility]  number for future appts. -He needs a PCP near [place] area. -Lives in [place] so no Paramedicine. -TOC consult -Will review meds with PharmD. Plan to send HF meds to [facility] at discharge.   Length of Stay: 3  FINCH, LINDSAY N, PA-C  05/01/2021, 12:08 PM  Advanced Heart Failure Team Pager [phone] (M-F; 7a - 5p)  Please contact [facility] for night-coverage after hours (4p -7a ) and weekends on [email or web address]   Patient seen and examined with the above-signed Advanced Practice Provider and/or Housestaff. I personally reviewed laboratory data, imaging studies and relevant notes. I independently examined the patient and formulated the important aspects of the plan. I have edited the note to reflect any of my changes or salient  points. I have personally discussed the plan with the patient and/or family.  49 y/o male with DM2, CKD 3a, severe CAD and chronic systolic HF due to iCM.  Has been pending CABG but course has been c/b recurrent diabetic ulcers.   Admitted currently for osteomyelitis of left 5th toe with involvement of lower leg. Now s/p toe amputations and extensive debridement of RLE.   Denies CP or SOB. + LE edema  General:  Lying flat in bed . No resp difficulty HEENT: normal Neck: supple. JVP to jaw Carotids 2+ bilat; no bruits. No lymphadenopathy or thryomegaly appreciated. Cor: PMI nondisplaced. Regular rate & rhythm. No rubs, gallops or murmurs. Lungs: clear Abdomen: soft, nontender, nondistended. No hepatosplenomegaly. No bruits or masses. Good bowel sounds. Extremities: no cyanosis, clubbing, rash, 2+ edema on right. LLE wrapped with JP drain  Neuro: alert & orientedx3, cranial nerves grossly intact. moves all 4 extremities w/o difficulty. Affect pleasant  He is volume overloaded. Continue IV lasix. Can add metolazone. Restart HF meds as tolerated. Watch renal function. Currently no c/o angina. Continue to defer CABG until infections cleared up.  Arvilla Meres, MD   3:16 PM

## 2021-05-02 LAB — CBC WITH DIFFERENTIAL/PLATELET
Abs Immature Granulocytes: 0.01 10*3/uL (ref 0.00–0.07)
Basophils Absolute: 0.1 10*3/uL (ref 0.0–0.1)
Basophils Relative: 1 %
Eosinophils Absolute: 0.3 10*3/uL (ref 0.0–0.5)
Eosinophils Relative: 4 %
HCT: 27.5 % — ABNORMAL LOW (ref 39.0–52.0)
Hemoglobin: 8.9 g/dL — ABNORMAL LOW (ref 13.0–17.0)
Immature Granulocytes: 0 %
Lymphocytes Relative: 44 %
Lymphs Abs: 3.7 10*3/uL (ref 0.7–4.0)
MCH: 29.1 pg (ref 26.0–34.0)
MCHC: 32.4 g/dL (ref 30.0–36.0)
MCV: 89.9 fL (ref 80.0–100.0)
Monocytes Absolute: 0.4 10*3/uL (ref 0.1–1.0)
Monocytes Relative: 5 %
Neutro Abs: 3.8 10*3/uL (ref 1.7–7.7)
Neutrophils Relative %: 46 %
Platelets: 188 10*3/uL (ref 150–400)
RBC: 3.06 MIL/uL — ABNORMAL LOW (ref 4.22–5.81)
RDW: 14.5 % (ref 11.5–15.5)
WBC: 8.2 10*3/uL (ref 4.0–10.5)
nRBC: 0 % (ref 0.0–0.2)

## 2021-05-02 LAB — BASIC METABOLIC PANEL
Anion gap: 9 (ref 5–15)
BUN: 16 mg/dL (ref 6–20)
CO2: 26 mmol/L (ref 22–32)
Calcium: 8.4 mg/dL — ABNORMAL LOW (ref 8.9–10.3)
Chloride: 100 mmol/L (ref 98–111)
Creatinine, Ser: 1.71 mg/dL — ABNORMAL HIGH (ref 0.61–1.24)
GFR, Estimated: 49 mL/min — ABNORMAL LOW (ref >60)
Glucose, Bld: 172 mg/dL — ABNORMAL HIGH (ref 70–99)
Potassium: 3.8 mmol/L (ref 3.5–5.1)
Sodium: 135 mmol/L (ref 135–145)

## 2021-05-02 LAB — GLUCOSE, CAPILLARY
Glucose-Capillary: 152 mg/dL — ABNORMAL HIGH (ref 70–99)
Glucose-Capillary: 226 mg/dL — ABNORMAL HIGH (ref 70–99)
Glucose-Capillary: 124 mg/dL — ABNORMAL HIGH (ref 70–99)
Glucose-Capillary: 137 mg/dL — ABNORMAL HIGH (ref 70–99)

## 2021-05-02 LAB — MAGNESIUM: Magnesium: 2.3 mg/dL (ref 1.7–2.4)

## 2021-05-02 MED ORDER — METOLAZONE 2.5 MG PO TABS
2.5000 mg | ORAL_TABLET | Freq: Once | ORAL | Status: AC
  Administered 2021-05-02: 2.5 mg via ORAL
  Filled 2021-05-02: qty 1

## 2021-05-02 MED ORDER — POTASSIUM CHLORIDE CRYS ER 20 MEQ PO TBCR
20.0000 meq | EXTENDED_RELEASE_TABLET | Freq: Two times a day (BID) | ORAL | Status: DC
  Administered 2021-05-02 – 2021-05-04 (×6): 20 meq via ORAL
  Filled 2021-05-02 (×6): qty 1

## 2021-05-02 NOTE — Progress Notes (Signed)
Pharmacy Antibiotic Note  Adam James is a 50 y.o. male admitted on 04/28/2021 with  osteomyelitis .  Pharmacy has been consulted for vancomycin and cefepime dosing.  Patient currently afebrile, wbc normal at 8, scr has trended up to 1.7.   Plan:  Plan appears to be stopping antibiotics today, will hold off on levels as he is a difficult stick Vancomycin 1000mg  IV q12h (per nomogram given BMI) Cefepime 2g IV q8h    Weight: 116.3 kg (256 lb 8 oz)  Temp (24hrs), Avg:97.9 F (36.6 C), Min:97.9 F (36.6 C), Max:97.9 F (36.6 C)  Recent Labs  Lab 04/28/21 1047 04/29/21 0500 04/30/21 0328 05/01/21 0239 05/02/21 0321  WBC 8.6 6.8 6.9 8.5 8.2  CREATININE 1.52* 1.35* 1.42* 1.47* 1.71*  LATICACIDVEN 1.6  --   --   --   --      Estimated Creatinine Clearance: 72.6 mL/min (A) (by C-G formula based on SCr of 1.71 mg/dL (H)).    Allergies  Allergen Reactions   Morphine Itching    Antimicrobials this admission: Vanc 10/4 >> 10/7 Cefepime 10/4 >> 10/7  Thank you for allowing pharmacy to be a part of this patient's care.  12/7 PharmD., BCPS Clinical Pharmacist 05/02/2021 10:39 AM

## 2021-05-02 NOTE — Progress Notes (Signed)
HD#4 SUBJECTIVE:  Patient Summary: Adam James is a 50 y.o. male with a pertinent PMH of CAD, diabetes, HFrEF (25%, 2022) who presented with a left foot wound with osteomyelitis and admitted for acute CHF and diabetic foot wound with osteomyelitis.  Overnight Events: None  Interim History:  [name] was seen and evaluated at the bedside this morning. He stated he is improving. Denies any acute concerns. He stated his sleep is interrupted by pain but does get sleep when he takes the pain medicine.   He states his swelling is somewhat better.   OBJECTIVE:  Vital Signs: Vitals:   05/01/21 1714 05/01/21 2033 05/02/21 0637 05/02/21 1200  BP: 105/73 107/78 115/84 116/83  Pulse: 90 84 90 87  Resp: '16 18 18 18  ' Temp:  97.9 F (36.6 C) 97.9 F (36.6 C) 98 F (36.7 C)  TempSrc:  Oral Oral Oral  SpO2: 92% 95% 95% 96%  Weight:   116.3 kg    Supplemental O2:  Room Air  Filed Weights   04/30/21 0422 05/01/21 0347 05/02/21 0637  Weight: 116.2 kg 116.5 kg 116.3 kg    Intake/Output Summary (Last 24 hours) at 05/02/2021 1330 Last data filed at 05/02/2021 1200 Gross per 24 hour  Intake 1140.09 ml  Output 2600 ml  Net -1459.91 ml   Net IO Since Admission: 1,378.2 mL [05/02/21 1330]  Physical Exam: 10/6 Physical Exam Vitals and nursing note reviewed.  Constitutional:      General: He is not in acute distress. HENT:     Head: Normocephalic and atraumatic.  Cardiovascular:     Rate and Rhythm: Normal rate and regular rhythm.     Heart sounds: Normal heart sounds.  Pulmonary:     Effort: Pulmonary effort is normal.     Breath sounds: Rales (At lung bases bilaterally) present.  Abdominal:     Palpations: Abdomen is soft.  Musculoskeletal:        General: Swelling present.     Right lower leg: Edema (2+ pitting) present.     Comments: Left foot covered in wrapping with wound vac attached. Minimal swelling, distal left foot is warm and pedal pulse 1+  Skin:     General: Skin is warm and dry.  Neurological:     Mental Status: He is alert and oriented to person, place, and time.  Psychiatric:     Comments: Mood dysphoric     Patient Lines/Drains/Airways Status     Active Line/Drains/Airways     Name Placement date Placement time Site Days   Peripheral IV 04/28/21 22 G Left Hand 04/28/21  1844  Hand  2   Peripheral IV 04/29/21 20 G Left Antecubital 04/29/21  0307  Antecubital  1   Incision (Closed) 02/24/21 Leg Left 02/24/21  2326  -- 65   Wound / Incision (Open or Dehisced) 02/25/21 Diabetic ulcer Foot Right 02/25/21  0130  Foot  64   Wound / Incision (Open or Dehisced) 04/29/21 Pretibial Left 04/29/21  1700  Pretibial  1   Wound / Incision (Open or Dehisced) 04/29/21 Foot Left;Posterior;Proximal;Distal 04/29/21  1500  Foot  1             ASSESSMENT/PLAN:  Assessment: Principal Problem:   Acute exacerbation of CHF (congestive heart failure) (HCC) Active Problems:   Diabetic foot ulcer (HCC)   CAD (coronary artery disease)   Osteomyelitis (HCC)   Acute osteomyelitis of left foot (HCC)   Plan: #Acute on chronic congestive heart  failure History NSTEMI August 2022 HFrEF (EF 25%, Aug 2022). Dry weight 113.5 kg based on discharge for same problem on 03/22/2021.  He's not at his dry weight and is still hypervolemic on physical exam, per above. Heart failure team consulted, appreciate recommendations and assistance. - Strict I's and O's, monitor daily weights, maintain Mg >2 and K>4 - Continue spironolactone 12.37m daily, per heart failure  - Continue metoprolol 25 mg daily - Daily BMP monitor for hypokalemia in the setting of diuresis  #Osteomyelitis of left fifth metatarsal head s/p ray amputation (04/30/2021), POD 2 Diabetic foot ulcer 5th metatarsal ray amputation on 10/5 with Dr. DSharol Given Patient will f/u in the office 1 week post operative. Patient remains afebrile & vital signs stable. No leukocytosis noted. Appreciate post-op  recommendations - Continue wound VAC for 1 week postop, per ortho - Touchdown weightbearing in a cam boot, per ortho  #AKI Baseline Cr about 1.4, eGFR > 60. Worsened by recent CHF exacerbation. Anticipate this will improve with further diuresis and improved cardiac function.  Up trending at Cr 1.71. Heart failure team consulted, appreciate recs. - Continue to monitor renal function  - CHF med management per HF team, as above  Type 2 diabetes with complications including diabetic foot ulcer and polyneuropathy Sliding scale insulin and CBG checks.  Tobacco abuse. 3 pack/day since the age of 89history.  Does not currently feel the urge to smoke. Can give nicotine patch if necessary.  Iron-Deficient Anemia History of UGIB 2/2 erosive esophagitis in Aug 2022. Hemoglobin remains stable.  - Transfuse as necessary, Hb <7 - Follow-up outpatient with CBC and repeat iron panel with PCP  Best Practice: Diet: Cardiac diet and Renal diet IVF: Fluids: NS 10cc/hr VTE: Place TED hose Start: 05/01/21 1441 SCDs Start: 04/30/21 1325 heparin injection 5,000 Units Start: 04/28/21 2200 Code: Full Therapy Recs: Pending  DISPO: Anticipated discharge in 2-3 days to SNF pending  clinical improvement .  Signature: JMerrily Brittle D.O.  Psychiatry Resident, PGY-1 [facility]  Pager: #[phone] PM, 05/02/2021   Please contact the on call pager after 5 pm and on weekends at [phone]

## 2021-05-02 NOTE — Progress Notes (Signed)
Advanced Heart Failure Rounding Note  PCP-Cardiologist: Norman Herrlich, MD   Subjective:    He remains volume overloaded and on 80 mg IV Lasix BID. He also received a dose of metolazone yesterday.  Net I/O is -711 with ~2.5L of urine output charted.  Creatinine worse today at 1.71 from 1.47.    He is sitting up in bed in no acute distress.  He endorses orthopnea and vomiting that he attributes to multiple potassium pills.    Objective:   Weight Range: 116.3 kg Body mass index is 32.06 kg/m.   Vital Signs:   Temp:  [97.9 F (36.6 C)] 97.9 F (36.6 C) (10/07 0637) Pulse Rate:  [84-90] 90 (10/07 0637) Resp:  [16-18] 18 (10/07 0637) BP: (105-115)/(73-84) 115/84 (10/07 0637) SpO2:  [92 %-95 %] 95 % (10/07 0637) Weight:  [116.3 kg] 116.3 kg (10/07 0637) Last BM Date: 05/01/21  Weight change: Filed Weights   04/30/21 0422 05/01/21 0347 05/02/21 0637  Weight: 116.2 kg 116.5 kg 116.3 kg    Intake/Output:   Intake/Output Summary (Last 24 hours) at 05/02/2021 1013 Last data filed at 05/02/2021 0514 Gross per 24 hour  Intake 1280.09 ml  Output 2175 ml  Net -894.91 ml      Physical Exam    General:  Well appearing.  No acute distress  HEENT: Normal Neck: Supple. JVP to jaw. Carotids 2+ bilat; no bruits. No lymphadenopathy or thyromegaly appreciated. Cor: PMI nondisplaced. Regular rate & rhythm. No rubs, gallops or murmurs. Lungs: Clear Abdomen: Soft, nontender, nondistended. No hepatosplenomegaly. No bruits or masses. Good bowel sounds. Mild edema  Extremities: No cyanosis, clubbing, rash, 2+ edema to BLE.  LLE wrapped with Prevena in place.  Sanguineous drainage noted in canister  Neuro: Alert & orientedx3, cranial nerves grossly intact. moves all 4 extremities w/o difficulty. Affect pleasant   Telemetry   SR in 80s with frequent PVCs.  Personally reviewed   EKG    None since admission   Labs    CBC Recent Labs    04/30/21 0328 05/01/21 0239  05/02/21 0321  WBC 6.9 8.5 8.2  NEUTROABS 3.4  --  3.8  HGB 8.8* 8.8* 8.9*  HCT 27.8* 27.9* 27.5*  MCV 90.8 91.5 89.9  PLT 180 185 188   Basic Metabolic Panel Recent Labs    87/86/76 0239 05/02/21 0321  NA 137 135  K 3.8 3.8  CL 103 100  CO2 24 26  GLUCOSE 185* 172*  BUN 16 16  CREATININE 1.47* 1.71*  CALCIUM 8.0* 8.4*  MG 1.7 2.3   Liver Function Tests No results for input(s): AST, ALT, ALKPHOS, BILITOT, PROT, ALBUMIN in the last 72 hours. No results for input(s): LIPASE, AMYLASE in the last 72 hours. Cardiac Enzymes No results for input(s): CKTOTAL, CKMB, CKMBINDEX, TROPONINI in the last 72 hours.  BNP: BNP (last 3 results) Recent Labs    03/23/21 0045 04/25/21 1505 04/28/21 1047  BNP 1,129.9* 1,691.7* 1,072.2*    ProBNP (last 3 results) Recent Labs    10/07/20 1004  PROBNP 468*     D-Dimer No results for input(s): DDIMER in the last 72 hours. Hemoglobin A1C No results for input(s): HGBA1C in the last 72 hours. Fasting Lipid Panel No results for input(s): CHOL, HDL, LDLCALC, TRIG, CHOLHDL, LDLDIRECT in the last 72 hours. Thyroid Function Tests No results for input(s): TSH, T4TOTAL, T3FREE, THYROIDAB in the last 72 hours.  Invalid input(s): FREET3  Other results:   Imaging  No results found.   Medications:     Scheduled Medications:  acetaminophen  1,000 mg Oral TID   aspirin EC  81 mg Oral Daily   atorvastatin  80 mg Oral q1800   docusate sodium  100 mg Oral BID   furosemide  80 mg Intravenous BID   heparin  5,000 Units Subcutaneous Q8H   insulin aspart  0-15 Units Subcutaneous TID WC   metoprolol succinate  25 mg Oral Daily   sodium chloride flush  3 mL Intravenous Q12H   spironolactone  12.5 mg Oral Daily    Infusions:  sodium chloride Stopped (04/30/21 1000)   sodium chloride 10 mL/hr at 04/30/21 2120   ceFEPime (MAXIPIME) IV 2 g (05/02/21 0938)   methocarbamol (ROBAXIN) IV     vancomycin Stopped (05/01/21 2230)    PRN  Medications: sodium chloride, acetaminophen **OR** acetaminophen, acetaminophen, bisacodyl, HYDROmorphone (DILAUDID) injection, methocarbamol **OR** methocarbamol (ROBAXIN) IV, ondansetron **OR** ondansetron (ZOFRAN) IV, oxyCODONE, oxyCODONE, polyethylene glycol    Patient Profile   The patient is a 50 y/o male with a medical history of CAD s/p PCI/stent 2007 in [place]. 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  Assessment/Plan   1. Acute on chronic HFrEF:  -With prior concern for low output HF.  Ischemic cardiomyopathy.  ? If frequent PVCs also contributing.  -Initial echo in 2/22 EF 40-45%.  Echo in 7/22 EF 30-35%.  Echo in 8/22 EF down to 25% with mild RV dysfunction.   -Admitted in 08/22 with a/c CHF.  Co-ox off tunneled catheter (which was removed) was 54.8% on 8/29, suggesting at least borderline low output.  RHC 8/31 with preserved CO, mildly elevated PCWP, mild pulmonary hypertension.   -Torsemide recently restarted in outpatient setting after he had not been taking at home. Had been taking an old supply of furosemide. -NYHA IIIb. Volume overloaded. On 80 mg lasix IV BID and received a dose of metolazone on 10/6.  Net I/O -711 with ~2.5L of urine output.  - Continue IV Lasix and give another dose of metolazone today.  UOP slowed down this morning with 500 mL noted in urinal after three voids  -Continue spironolactone, but monitor renal function closely  -Replete K and Mg aggressively  -Can continue Toprol XL 50 mg daily for now -Add Entresto prior to discharge if BP allows and renal function improves  -Avoid SGLT2i with history of necrotizing fasciitis and poor hygiene. 2. CAD:  -LHC in 2/22 with 50% dLM, 70% ramus, 70% pLCx, totally occluded proximal RCA, 90% stenosis ostial to mid LAD, 90% mid LAD, totally occluded  distal LAD.  Cardiac MRI in 4/22 showed EF 40% with substantial viable myocardium, only delayed enhancement was subendocardial in the mid to apical inferior wall.   -CABG had been planned in 8/22, but patient was admitted left foot diabetic foot infection that progressed to necrotizing fasciitis s/p debridement and IV abx.  Now in with left 5th metatarsal osteomyelitis and still has healing surgical wound left foot/lower leg. Ideally, would have CABG.  However, need to have further healing of the left lower leg.   -Has outpatient f/u with CT surgery later this month.  -Repeat coronary angiography (9/22) showed minimal change from prior study in 2/22, LVEDP 16. -Continue ASA 81 daily.  -Continue atorvastatin 80 mg daily.  3. Frequent PVCs: -Reviewed telemetry, up to 20-25/min  -Continue beta  blocker -Keep K > 4 and Mag > 2 4. CKD, Stage 3a: -Scr had been in 2s with improvement to ~1.4 to 1.5, but now up to 1.7.   5. GI bleeding:  -History of erosive esophagitis earlier in 8/22.  Had been started on protonix during prior admit but no longer taking. - Hgb stable at 8.8 6. Left leg diabetic foot infection => necrotizing fasciitis in August 2022 -Currently admitted with left 5th metatarsal osteomyelitis s/p amputation. Completed cefepime and vancomycin on 10/7.   -Prior surgical incision LLE healing. 7. DM2:  -Per primary team 8. SDOH:  -He has filled out paperwork for disability and medicaid. Difficulty paying for medications. -Mother helps with transportation to appts, has been given [facility] number for future appts. -He needs a PCP near [place] area. -Lives in [place] so no Paramedicine. -TOC consult -Will review meds with PharmD. Plan to send HF meds to [facility] at discharge.   Medication concerns reviewed with patient and pharmacy team. Barriers identified: Obtaining meds   Length of Stay: 4  Peter Garter, NP  05/02/2021, 10:13 AM  Advanced Heart Failure Team Pager [phone]  (M-F; 7a - 5p)  Please contact [facility] for night-coverage after hours (5p -7a ) and weekends on [email or web address]   Patient seen with NP, agree with the above note.   Patient readmitted for left foot osteomyelitis, s/p 5th metatarsal ray amputation.   Also noted to be volume overloaded.  Creatinine 1.7 today.   General: NAD Neck: JVP 14 cm, no thyromegaly or thyroid nodule.  Lungs: Clear to auscultation bilaterally with normal respiratory effort. CV: Nondisplaced PMI.  Heart regular S1/S2, no S3/S4, no murmur.  1+ edema to knee on right, left leg wrapped.   Abdomen: Soft, nontender, no hepatosplenomegaly, no distention.  Skin: Intact without lesions or rashes.  Neurologic: Alert and oriented x 3.  Psych: Normal affect. Extremities: No clubbing or cyanosis.  HEENT: Normal.   He remains volume overloaded.  Agree with Lasix 80 mg IV bid + metolazone today.  Follow creatinine closely. During last hospitalization, creatinine was around 2, but down to 1.3 as outpatient.  Now 1.7.   Can continue spironolactone 12.5 and Toprol XL.  No ARNI with rise in creatinine for now.  No SGLT2 inhibitor with recent necrotizing fasciitis and infection risk.   Ultimately, needs CABG.  However, will have to heal left leg first.    Marca Ancona 05/02/2021 2:26 PM

## 2021-05-02 NOTE — Plan of Care (Signed)

## 2021-05-02 NOTE — Hospital Course (Signed)
metolazone 2.5 mg x1 Also on discharge, add Entresto if BP allows, and avoid SGLT2i with history of necrotizing fasciitis and poor hygiene.

## 2021-05-03 LAB — CBC WITH DIFFERENTIAL/PLATELET
Abs Immature Granulocytes: 0.02 10*3/uL (ref 0.00–0.07)
Basophils Absolute: 0.1 10*3/uL (ref 0.0–0.1)
Basophils Relative: 1 %
Eosinophils Absolute: 0.3 10*3/uL (ref 0.0–0.5)
Eosinophils Relative: 4 %
HCT: 25.9 % — ABNORMAL LOW (ref 39.0–52.0)
Hemoglobin: 8.5 g/dL — ABNORMAL LOW (ref 13.0–17.0)
Immature Granulocytes: 0 %
Lymphocytes Relative: 38 %
Lymphs Abs: 2.7 10*3/uL (ref 0.7–4.0)
MCH: 29.6 pg (ref 26.0–34.0)
MCHC: 32.8 g/dL (ref 30.0–36.0)
MCV: 90.2 fL (ref 80.0–100.0)
Monocytes Absolute: 0.4 10*3/uL (ref 0.1–1.0)
Monocytes Relative: 6 %
Neutro Abs: 3.6 10*3/uL (ref 1.7–7.7)
Neutrophils Relative %: 51 %
Platelets: 164 10*3/uL (ref 150–400)
RBC: 2.87 MIL/uL — ABNORMAL LOW (ref 4.22–5.81)
RDW: 14.5 % (ref 11.5–15.5)
WBC: 7.1 10*3/uL (ref 4.0–10.5)
nRBC: 0 % (ref 0.0–0.2)

## 2021-05-03 LAB — BASIC METABOLIC PANEL
Anion gap: 7 (ref 5–15)
BUN: 15 mg/dL (ref 6–20)
CO2: 27 mmol/L (ref 22–32)
Calcium: 8.4 mg/dL — ABNORMAL LOW (ref 8.9–10.3)
Chloride: 100 mmol/L (ref 98–111)
Creatinine, Ser: 1.74 mg/dL — ABNORMAL HIGH (ref 0.61–1.24)
GFR, Estimated: 48 mL/min — ABNORMAL LOW (ref >60)
Glucose, Bld: 182 mg/dL — ABNORMAL HIGH (ref 70–99)
Potassium: 3.8 mmol/L (ref 3.5–5.1)
Sodium: 134 mmol/L — ABNORMAL LOW (ref 135–145)

## 2021-05-03 LAB — GLUCOSE, CAPILLARY
Glucose-Capillary: 163 mg/dL — ABNORMAL HIGH (ref 70–99)
Glucose-Capillary: 220 mg/dL — ABNORMAL HIGH (ref 70–99)
Glucose-Capillary: 138 mg/dL — ABNORMAL HIGH (ref 70–99)
Glucose-Capillary: 186 mg/dL — ABNORMAL HIGH (ref 70–99)

## 2021-05-03 LAB — MAGNESIUM: Magnesium: 2.1 mg/dL (ref 1.7–2.4)

## 2021-05-03 NOTE — Progress Notes (Signed)
HD#5 SUBJECTIVE:  Patient Summary: Adam James is a 50 y.o. male with a pertinent PMH of CAD, diabetes, HFrEF (25%, 2022) who presented with a left foot wound with osteomyelitis and admitted for acute CHF and diabetic foot wound with osteomyelitis.  Overnight Events: None  Interim History:  [name] was seen and evaluated at the bedside this morning. States that he feels less edematous than yesterday and that his pain is well controlled. States that SOB has improved overall since admission. Patient wants to know if his CABG will be rescheduled to sooner date, discussed with him that it is up to his cardiologists.  He denies any acute new problems, and has no other concerns.  OBJECTIVE:  Vital Signs: Vitals:   05/02/21 2026 05/03/21 0300 05/03/21 0600 05/03/21 1124  BP: 104/78 126/68  103/70  Pulse: 86   93  Resp: 18   16  Temp: 97.9 F (36.6 C) 99.2 F (37.3 C)  97.8 F (36.6 C)  TempSrc: Oral Oral  Oral  SpO2: 96%   97%  Weight:   114.7 kg    Supplemental O2:  Room Air   Filed Weights   05/01/21 0347 05/02/21 0637 05/03/21 0600  Weight: 116.5 kg 116.3 kg 114.7 kg  Dry wt: 113.5kg   Intake/Output Summary (Last 24 hours) at 05/03/2021 1253 Last data filed at 05/03/2021 1038 Gross per 24 hour  Intake 880 ml  Output 4751 ml  Net -3871 ml   Net IO Since Admission: -2,972.8 mL [05/03/21 1253]  Physical Exam: Physical Exam Vitals and nursing note reviewed.  Constitutional:      General: He is not in acute distress. HENT:     Head: Normocephalic and atraumatic.  Cardiovascular:     Rate and Rhythm: Normal rate and regular rhythm.     Heart sounds: Normal heart sounds.  Pulmonary:     Effort: Pulmonary effort is normal.     Breath sounds: Rales (At lung bases bilaterally) present.  Abdominal:     Palpations: Abdomen is soft.  Musculoskeletal:        General: Swelling present. No tenderness.     Right lower leg: Edema (2+ pitting) present.      Comments: Left foot covered in wrapping with wound vac attached. Minimal swelling, distal left foot is warm and pedal pulse 1+  Skin:    General: Skin is warm and dry.  Neurological:     Mental Status: He is alert and oriented to person, place, and time.  Psychiatric:     Comments: Mood dysphoric     Patient Lines/Drains/Airways Status     Active Line/Drains/Airways     Name Placement date Placement time Site Days   Peripheral IV 04/28/21 22 G Left Hand 04/28/21  1844  Hand  2   Peripheral IV 04/29/21 20 G Left Antecubital 04/29/21  0307  Antecubital  1   Incision (Closed) 02/24/21 Leg Left 02/24/21  2326  -- 65   Wound / Incision (Open or Dehisced) 02/25/21 Diabetic ulcer Foot Right 02/25/21  0130  Foot  64   Wound / Incision (Open or Dehisced) 04/29/21 Pretibial Left 04/29/21  1700  Pretibial  1   Wound / Incision (Open or Dehisced) 04/29/21 Foot Left;Posterior;Proximal;Distal 04/29/21  1500  Foot  1             ASSESSMENT/PLAN:  Assessment: Principal Problem:   Acute exacerbation of CHF (congestive heart failure) (HCC) Active Problems:   Diabetic foot ulcer ([place])  CAD (coronary artery disease)   Osteomyelitis (HCC)   Acute osteomyelitis of left foot ([place])   Plan: #Acute on chronic congestive heart failure History NSTEMI August 2022 HFrEF (EF 25%, Aug 2022). Dry weight 113.5 kg based on discharge for same problem on 03/22/2021.  He's not at his dry weight and is still hypervolemic on physical exam, per above. Heart failure team consulted, appreciate recommendations and assistance. - Continue management per Heart Failure Team - Continue home spironolactone 12.64m daily, per heart failure  - Continue home metoprolol 25 mg daily - Strict I's and O's, monitor daily weights, maintain Mg >2 and K>4 - Daily BMP monitor for hypokalemia in the setting of diuresis  #Osteomyelitis of left fifth metatarsal head s/p ray amputation (04/30/2021), POD 3 Diabetic foot ulcer 5th  metatarsal ray amputation on 10/5 with Dr. DSharol Given Patient will f/u in the office 1 week post operative. Patient remains afebrile & vital signs stable. No leukocytosis noted. Appreciate post-op recommendations - Continue wound VAC for 1 week postop, per ortho - Touchdown weightbearing in a cam boot, per ortho  #AKI Baseline Cr about 1.4, eGFR > 60. Worsened by recent CHF exacerbation. Anticipate this will improve with further diuresis and improved cardiac function. Heart failure team consulted, appreciate recs. - Continue to monitor renal function  - CHF med management per HF team, as above  Type 2 diabetes with complications including diabetic foot ulcer and polyneuropathy Sliding scale insulin and CBG checks.  Tobacco abuse. 3 pack/day since the age of 89history.  Does not currently feel the urge to smoke. Can give nicotine patch if necessary.  Iron-Deficient Anemia History of UGIB 2/2 erosive esophagitis in Aug 2022. Hemoglobin remains stable.  - Transfuse as necessary, Hb <7 - Follow-up outpatient with CBC and repeat iron panel with PCP  Best Practice: Diet: Cardiac diet and Renal diet IVF: Fluids: NS 10cc/hr VTE: Place TED hose Start: 05/01/21 1441 SCDs Start: 04/30/21 1325 heparin injection 5,000 Units Start: 04/28/21 2200 Code: Full Therapy Recs: Pending  DISPO: Anticipated discharge in 2-3 days to SNF pending  clinical improvement .  Signature: JMerrily Brittle D.O.  Psychiatry Resident, PGY-1 [facility]  Pager: #[phone]:53 PM, 05/03/2021   Please contact the on call pager after 5 pm and on weekends at [phone]

## 2021-05-03 NOTE — Progress Notes (Signed)
Advanced Heart Failure Rounding Note  PCP-Cardiologist: Norman Herrlich, MD   Subjective:    Remains on lasix 80 IV bid and metolazone. Brisk diuresis with 4.6L out. Weight down 4 pounds.  SCr stable at 1.7  Says he still feels fluid overloaded up into his belly and side.   Denies CP, SOB, orthopnea or PND.   Objective:   Weight Range: 114.7 kg Body mass index is 31.61 kg/m.   Vital Signs:   Temp:  [97.9 F (36.6 C)-99.2 F (37.3 C)] 99.2 F (37.3 C) (10/08 0300) Pulse Rate:  [85-87] 86 (10/07 2026) Resp:  [17-18] 18 (10/07 2026) BP: (104-127)/(68-83) 126/68 (10/08 0300) SpO2:  [96 %-98 %] 96 % (10/07 2026) Weight:  [114.7 kg] 114.7 kg (10/08 0600) Last BM Date: 05/01/21  Weight change: Filed Weights   05/01/21 0347 05/02/21 0637 05/03/21 0600  Weight: 116.5 kg 116.3 kg 114.7 kg    Intake/Output:   Intake/Output Summary (Last 24 hours) at 05/03/2021 1032 Last data filed at 05/03/2021 0802 Gross per 24 hour  Intake 1080 ml  Output 4880 ml  Net -3800 ml       Physical Exam    General:  Sitting up in bed No resp difficulty HEENT: normal Neck: supple. JVP 9-10 Carotids 2+ bilat; no bruits. No lymphadenopathy or thryomegaly appreciated. Cor: PMI nondisplaced. Regular rate & rhythm. No rubs, gallops or murmurs. Lungs: clear Abdomen: soft, nontender, nondistended. No hepatosplenomegaly. No bruits or masses. Good bowel sounds. Extremities: no cyanosis, clubbing, rash, LLE wrapped with drain  2+ RLE Neuro: alert & orientedx3, cranial nerves grossly intact. moves all 4 extremities w/o difficulty. Affect pleasant   Telemetry   SR in 80s-90 with frequent PVCs.  Personally reviewed   Labs    CBC Recent Labs    05/02/21 0321 05/03/21 0301  WBC 8.2 7.1  NEUTROABS 3.8 3.6  HGB 8.9* 8.5*  HCT 27.5* 25.9*  MCV 89.9 90.2  PLT 188 164    Basic Metabolic Panel Recent Labs    54/62/70 0321 05/03/21 0301  NA 135 134*  K 3.8 3.8  CL 100 100  CO2 26 27   GLUCOSE 172* 182*  BUN 16 15  CREATININE 1.71* 1.74*  CALCIUM 8.4* 8.4*  MG 2.3 2.1    Liver Function Tests No results for input(s): AST, ALT, ALKPHOS, BILITOT, PROT, ALBUMIN in the last 72 hours. No results for input(s): LIPASE, AMYLASE in the last 72 hours. Cardiac Enzymes No results for input(s): CKTOTAL, CKMB, CKMBINDEX, TROPONINI in the last 72 hours.  BNP: BNP (last 3 results) Recent Labs    03/23/21 0045 04/25/21 1505 04/28/21 1047  BNP 1,129.9* 1,691.7* 1,072.2*     ProBNP (last 3 results) Recent Labs    10/07/20 1004  PROBNP 468*      D-Dimer No results for input(s): DDIMER in the last 72 hours. Hemoglobin A1C No results for input(s): HGBA1C in the last 72 hours. Fasting Lipid Panel No results for input(s): CHOL, HDL, LDLCALC, TRIG, CHOLHDL, LDLDIRECT in the last 72 hours. Thyroid Function Tests No results for input(s): TSH, T4TOTAL, T3FREE, THYROIDAB in the last 72 hours.  Invalid input(s): FREET3  Other results:   Imaging    No results found.   Medications:     Scheduled Medications:  acetaminophen  1,000 mg Oral TID   aspirin EC  81 mg Oral Daily   atorvastatin  80 mg Oral q1800   docusate sodium  100 mg Oral BID   furosemide  80 mg Intravenous BID   heparin  5,000 Units Subcutaneous Q8H   insulin aspart  0-15 Units Subcutaneous TID WC   metoprolol succinate  25 mg Oral Daily   potassium chloride  20 mEq Oral BID   sodium chloride flush  3 mL Intravenous Q12H   spironolactone  12.5 mg Oral Daily    Infusions:  sodium chloride Stopped (04/30/21 1000)   sodium chloride 10 mL/hr at 05/03/21 1014   methocarbamol (ROBAXIN) IV     vancomycin 1,000 mg (05/03/21 1015)    PRN Medications: sodium chloride, acetaminophen **OR** acetaminophen, acetaminophen, bisacodyl, HYDROmorphone (DILAUDID) injection, methocarbamol **OR** methocarbamol (ROBAXIN) IV, ondansetron **OR** ondansetron (ZOFRAN) IV, oxyCODONE, oxyCODONE, polyethylene  glycol    Patient Profile   The patient is a 50 y/o male with a medical history of CAD s/p PCI/stent 2007 in [place]. 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  Assessment/Plan   1. Acute on chronic HFrEF:  -With prior concern for low output HF.  Ischemic cardiomyopathy.  ? If frequent PVCs also contributing.  -Initial echo in 2/22 EF 40-45%.  Echo in 7/22 EF 30-35%.  Echo in 8/22 EF down to 25% with mild RV dysfunction.   -Admitted in 08/22 with a/c CHF.  Co-ox off tunneled catheter (which was removed) was 54.8% on 8/29, suggesting at least borderline low output.  RHC 8/31 with preserved CO, mildly elevated PCWP, mild pulmonary hypertension.   -Torsemide recently restarted in outpatient setting after he had not been taking at home. Had been taking an old supply of furosemide. -NYHA IIIb. Volume overloaded. Excellent diuresis on 80 mg lasix IV BID and metolazone. Weight down 4 pounds. Baseline weight hard to determine but seems to be about 252 pounds. Still volume overloaded on exam. Continue diuresis. Place TED hose on RLE -Continue spironolactone, -Replete K and Mg as needed -Can continue Toprol XL 50 mg daily for now -Add Entresto prior to discharge if BP allows and renal function improves  -Avoid SGLT2i with history of necrotizing fasciitis and poor hygiene. 2. CAD:  -LHC in 2/22 with 50% dLM, 70% ramus, 70% pLCx, totally occluded proximal RCA, 90% stenosis ostial to mid LAD, 90% mid LAD, totally occluded distal LAD.  Cardiac MRI in 4/22 showed EF 40% with substantial viable myocardium, only delayed enhancement was subendocardial in the mid to apical inferior wall.   -CABG had been planned in 8/22, but patient was admitted left foot diabetic foot infection that progressed to necrotizing fasciitis s/p debridement and IV abx.   Now in with left 5th metatarsal osteomyelitis and still has healing surgical wound left foot/lower leg. Ideally, would have CABG.  However, need to have further healing of the left lower leg.   -Has outpatient f/u with CT surgery later this month.  -Repeat coronary angiography (9/22) showed minimal change from prior study in 2/22, LVEDP 16. - No current s/s of angina -Continue ASA 81 daily.  -Continue atorvastatin 80 mg daily.  3. Frequent PVCs: -Reviewed telemetry, up to 20-25/min  -Continue beta blocker -Keep K > 4 and Mag > 2 - can consider mexiltene 4. CKD, Stage 3a: -Scr had been in 2s with improvement to ~1.4 to 1.5, but now stable at 1.7.   5. GI bleeding:  -History of erosive esophagitis earlier in 8/22.  Had been started on protonix during prior admit but no longer taking. - Hgb  stable ~8.5 6. Left leg diabetic foot infection => necrotizing fasciitis in August 2022 -Currently admitted with left 5th metatarsal osteomyelitis s/p amputation. Completed cefepime and vancomycin on 10/7.   -Prior surgical incision LLE healing. 7. DM2:  -Per primary team 8. SDOH:  -He has filled out paperwork for disability and medicaid. Difficulty paying for medications. -Mother helps with transportation to appts, has been given [facility] number for future appts. -He needs a PCP near [place] area. -Lives in [place] so no Paramedicine. -TOC consult -Will review meds with PharmD. Plan to send HF med[facility] to [facility] at discharge.    Length of Stay: 5  Arvilla Meres, MD  05/03/2021, 10:32 AM  Advanced Heart Failure Team Pager [phone] (M-F; 7a - 5p)  Please contact [facility] for night-coverage after hours (5p -7a ) and weekends on [email or web address]

## 2021-05-04 DIAGNOSIS — I5043 Acute on chronic combined systolic (congestive) and diastolic (congestive) heart failure: Secondary | ICD-10-CM

## 2021-05-04 LAB — BASIC METABOLIC PANEL
Anion gap: 8 (ref 5–15)
BUN: 16 mg/dL (ref 6–20)
CO2: 30 mmol/L (ref 22–32)
Calcium: 9 mg/dL (ref 8.9–10.3)
Chloride: 98 mmol/L (ref 98–111)
Creatinine, Ser: 1.82 mg/dL — ABNORMAL HIGH (ref 0.61–1.24)
GFR, Estimated: 45 mL/min — ABNORMAL LOW (ref >60)
Glucose, Bld: 176 mg/dL — ABNORMAL HIGH (ref 70–99)
Potassium: 3.9 mmol/L (ref 3.5–5.1)
Sodium: 136 mmol/L (ref 135–145)

## 2021-05-04 LAB — GLUCOSE, CAPILLARY
Glucose-Capillary: 214 mg/dL — ABNORMAL HIGH (ref 70–99)
Glucose-Capillary: 219 mg/dL — ABNORMAL HIGH (ref 70–99)
Glucose-Capillary: 200 mg/dL — ABNORMAL HIGH (ref 70–99)
Glucose-Capillary: 182 mg/dL — ABNORMAL HIGH (ref 70–99)

## 2021-05-04 LAB — CBC
HCT: 29.6 % — ABNORMAL LOW (ref 39.0–52.0)
Hemoglobin: 9.4 g/dL — ABNORMAL LOW (ref 13.0–17.0)
MCH: 28.7 pg (ref 26.0–34.0)
MCHC: 31.8 g/dL (ref 30.0–36.0)
MCV: 90.2 fL (ref 80.0–100.0)
Platelets: 217 10*3/uL (ref 150–400)
RBC: 3.28 MIL/uL — ABNORMAL LOW (ref 4.22–5.81)
RDW: 14.4 % (ref 11.5–15.5)
WBC: 9.6 10*3/uL (ref 4.0–10.5)
nRBC: 0 % (ref 0.0–0.2)

## 2021-05-04 LAB — MAGNESIUM: Magnesium: 2 mg/dL (ref 1.7–2.4)

## 2021-05-04 MED ORDER — SACUBITRIL-VALSARTAN 24-26 MG PO TABS
1.0000 | ORAL_TABLET | Freq: Two times a day (BID) | ORAL | Status: DC
  Administered 2021-05-04 – 2021-05-07 (×6): 1 via ORAL
  Filled 2021-05-04 (×6): qty 1

## 2021-05-04 MED ORDER — TORSEMIDE 20 MG PO TABS
40.0000 mg | ORAL_TABLET | Freq: Every day | ORAL | Status: DC
  Administered 2021-05-05 – 2021-05-07 (×3): 40 mg via ORAL
  Filled 2021-05-04 (×3): qty 2

## 2021-05-04 MED ORDER — MEXILETINE HCL 200 MG PO CAPS
200.0000 mg | ORAL_CAPSULE | Freq: Two times a day (BID) | ORAL | Status: DC
  Administered 2021-05-04 – 2021-05-07 (×6): 200 mg via ORAL
  Filled 2021-05-04 (×7): qty 1

## 2021-05-04 NOTE — Progress Notes (Signed)
Advanced Heart Failure Rounding Note  PCP-Cardiologist: Norman Herrlich, MD   Subjective:    Remains on lasix 80 IV bid and metolazone. Brisk diuresis again overnight with 4.6L out. Weight down another 9 pounds (16 pounds total).  SCr stable at 1.7 -> 1.8  Denies CP, SOB orthopnea or PND. Still feels he has fluid in his flanks  Objective:   Weight Range: 110.5 kg Body mass index is 30.45 kg/m.   Vital Signs:   Temp:  [97.8 F (36.6 C)-98.3 F (36.8 C)] 97.8 F (36.6 C) (10/09 0253) Pulse Rate:  [53-93] 53 (10/09 0253) Resp:  [16-19] 19 (10/09 0253) BP: (103-133)/(70-84) 133/84 (10/09 0253) SpO2:  [94 %-98 %] 98 % (10/09 0253) Weight:  [110.5 kg] 110.5 kg (10/09 0253) Last BM Date: 05/03/21  Weight change: Filed Weights   05/02/21 0637 05/03/21 0600 05/04/21 0253  Weight: 116.3 kg 114.7 kg 110.5 kg    Intake/Output:   Intake/Output Summary (Last 24 hours) at 05/04/2021 1023 Last data filed at 05/04/2021 0830 Gross per 24 hour  Intake 834 ml  Output 4376 ml  Net -3542 ml       Physical Exam    General:  Sitting up in bed No resp difficulty HEENT: normal Neck: supple. JVP 7 . Carotids 2+ bilat; no bruits. No lymphadenopathy or thryomegaly appreciated. Cor: PMI nondisplaced. Regular. No rubs, gallops or murmurs. Lungs: clear Abdomen: soft, nontender, nondistended. No hepatosplenomegaly. No bruits or masses. Good bowel sounds. Extremities: no cyanosis, clubbing, rash, trace edema on R  LLE wrapped Neuro: alert & orientedx3, cranial nerves grossly intact. moves all 4 extremities w/o difficulty. Affect pleasant   Telemetry   SR in 80-100 + PVCs 20/minute. Personally reviewed   Labs    CBC Recent Labs    05/02/21 0321 05/03/21 0301 05/04/21 0223  WBC 8.2 7.1 9.6  NEUTROABS 3.8 3.6  --   HGB 8.9* 8.5* 9.4*  HCT 27.5* 25.9* 29.6*  MCV 89.9 90.2 90.2  PLT 188 164 217    Basic Metabolic Panel Recent Labs    78/29/56 0301 05/04/21 0223  NA 134*  136  K 3.8 3.9  CL 100 98  CO2 27 30  GLUCOSE 182* 176*  BUN 15 16  CREATININE 1.74* 1.82*  CALCIUM 8.4* 9.0  MG 2.1 2.0    Liver Function Tests No results for input(s): AST, ALT, ALKPHOS, BILITOT, PROT, ALBUMIN in the last 72 hours. No results for input(s): LIPASE, AMYLASE in the last 72 hours. Cardiac Enzymes No results for input(s): CKTOTAL, CKMB, CKMBINDEX, TROPONINI in the last 72 hours.  BNP: BNP (last 3 results) Recent Labs    03/23/21 0045 04/25/21 1505 04/28/21 1047  BNP 1,129.9* 1,691.7* 1,072.2*     ProBNP (last 3 results) Recent Labs    10/07/20 1004  PROBNP 468*      D-Dimer No results for input(s): DDIMER in the last 72 hours. Hemoglobin A1C No results for input(s): HGBA1C in the last 72 hours. Fasting Lipid Panel No results for input(s): CHOL, HDL, LDLCALC, TRIG, CHOLHDL, LDLDIRECT in the last 72 hours. Thyroid Function Tests No results for input(s): TSH, T4TOTAL, T3FREE, THYROIDAB in the last 72 hours.  Invalid input(s): FREET3  Other results:   Imaging    No results found.   Medications:     Scheduled Medications:  acetaminophen  1,000 mg Oral TID   aspirin EC  81 mg Oral Daily   atorvastatin  80 mg Oral q1800   docusate sodium  100 mg Oral BID   furosemide  80 mg Intravenous BID   heparin  5,000 Units Subcutaneous Q8H   insulin aspart  0-15 Units Subcutaneous TID WC   metoprolol succinate  25 mg Oral Daily   potassium chloride  20 mEq Oral BID   sodium chloride flush  3 mL Intravenous Q12H   spironolactone  12.5 mg Oral Daily    Infusions:  sodium chloride Stopped (04/30/21 1000)   sodium chloride 10 mL/hr at 05/03/21 1014   methocarbamol (ROBAXIN) IV      PRN Medications: sodium chloride, bisacodyl, HYDROmorphone (DILAUDID) injection, methocarbamol **OR** methocarbamol (ROBAXIN) IV, ondansetron **OR** ondansetron (ZOFRAN) IV, oxyCODONE, oxyCODONE, polyethylene glycol    Patient Profile   The patient is a 50 y/o  male with a medical history of CAD s/p PCI/stent 2007 in [place]. 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  Assessment/Plan   1. Acute on chronic HFrEF:  -With prior concern for low output HF.  Ischemic cardiomyopathy.  ? If frequent PVCs also contributing.  -Initial echo in 2/22 EF 40-45%.  Echo in 7/22 EF 30-35%.  Echo in 8/22 EF down to 25% with mild RV dysfunction.   -Admitted in 08/22 with a/c CHF.  Co-ox off tunneled catheter (which was removed) was 54.8% on 8/29, suggesting at least borderline low output.  RHC 8/31 with preserved CO, mildly elevated PCWP, mild pulmonary hypertension.   -Torsemide recently restarted in outpatient setting after he had not been taking at home. Had been taking an old supply of furosemide. -NYHA IIIb. Volume overloaded. Excellent diuresis on 80 mg lasix IV BID and metolazone. Weight down 16 pounds over 2 days . Baseline weight hard to determine but seems to be about 252 pounds. He is 243 today.  - Switch IV lasix back to po torsemide -Continue spironolactone, -Continue Toprol XL 25 mg daily for now -Add back Entresto 24/26 bid -Avoid SGLT2i with history of necrotizing fasciitis and poor hygiene. 2. CAD:  -LHC in 2/22 with 50% dLM, 70% ramus, 70% pLCx, totally occluded proximal RCA, 90% stenosis ostial to mid LAD, 90% mid LAD, totally occluded distal LAD.  Cardiac MRI in 4/22 showed EF 40% with substantial viable myocardium, only delayed enhancement was subendocardial in the mid to apical inferior wall.   -CABG had been planned in 8/22, but patient was admitted left foot diabetic foot infection that progressed to necrotizing fasciitis s/p debridement and IV abx.  Now in with left 5th metatarsal osteomyelitis and still has healing surgical wound left foot/lower leg. Ideally, would have CABG.  However,  need to have further healing of the left lower leg.   -Has outpatient f/u with CT surgery later this month.  -Repeat coronary angiography (9/22) showed minimal change from prior study in 2/22, LVEDP 16. -No current s/s of angina -Continue ASA 81 daily.  -Continue atorvastatin 80 mg daily.  3. Frequent PVCs: -Reviewed telemetry, up to 20/min  -Continue beta blocker -Keep K > 4 and Mag > 2 -Add mexilitene 200 bid 4. CKD, Stage 3a: -Scr had been in 2s with improvement to ~1.4 to 1.5, but now stable at 1.7-1.8 5. GI bleeding:  -History of erosive esophagitis earlier in 8/22.  Had been started on protonix during prior admit but no longer taking. - Hgb 8.5 -> 9.4 6. Left leg diabetic foot infection => necrotizing fasciitis in August 2022 -Currently  admitted with left 5th metatarsal osteomyelitis s/p amputation. Completed cefepime and vancomycin on 10/7.   -Prior surgical incision LLE healing. 7. DM2:  -Per primary team 8. SDOH:  -He has filled out paperwork for disability and medicaid. Difficulty paying for medications. -Mother helps with transportation to appts, has been given [facility] number for future appts. -He needs a PCP near [place] area. -Lives in [place] so no Paramedicine. -TOC consult - Plan to send HF meds to [facility] at discharge.    Length of Stay: 6  Arvilla Meres, MD  05/04/2021, 10:23 AM  Advanced Heart Failure Team Pager [phone] (M-F; 7a - 5p)  Please contact [facility] for night-coverage after hours (5p -7a ) and weekends on [email or web address]

## 2021-05-04 NOTE — Progress Notes (Signed)
Pt. Wound VAC requires it to be reset to stop alarming but the alarm returns within the hour.  Multiple things done to trouble shoot it and the foam is compressed, suctioning/sealed seemingly properly.  The Charge nurse has done the same.  We have ordered a new dressing but only the MD is able to change this and the VAC may be the issue as well.  Attempting to obtain a new machine.

## 2021-05-04 NOTE — Progress Notes (Signed)
Pt is alert and oriented X4. Reports pain to left leg, pain med administered, will re-evaluate the effectiveness in 40 minutes. He refused bed alarm to be turned on, he has been educated on the risk for falls, he verbalized understanding.

## 2021-05-04 NOTE — Progress Notes (Signed)
HD#6 SUBJECTIVE:  Patient Summary: Adam James is a 50 y.o. male with a pertinent PMH of CAD, diabetes, HFrEF (25%, 2022) who presented with a left foot wound with osteomyelitis and admitted for acute CHF and diabetic foot wound with osteomyelitis.  Overnight Events: None  Interim History:  Mr. Sheehan Stacey was seen and evaluated at the bedside this morning.  He denies any acute complaints at this time other than frustration with being placed on contact precautions for prior MRSA infection.  He still feels like he has some edema in the sacral region but denies any shortness of breath or orthopnea.  He is hoping to go home soon.  OBJECTIVE:  Vital Signs: Vitals:   05/03/21 0600 05/03/21 1124 05/03/21 2029 05/04/21 0253  BP:  103/70 118/79 133/84  Pulse:  93 87 (!) 53  Resp:  16 18 19   Temp:  97.8 F (36.6 C) 98.3 F (36.8 C) 97.8 F (36.6 C)  TempSrc:  Oral Oral Oral  SpO2:  97% 94% 98%  Weight: 114.7 kg   110.5 kg   Supplemental O2:  Room Air   Filed Weights   05/02/21 0637 05/03/21 0600 05/04/21 0253  Weight: 116.3 kg 114.7 kg 110.5 kg  Dry wt: 113.5kg   Intake/Output Summary (Last 24 hours) at 05/04/2021 07/04/2021 Last data filed at 05/04/2021 0259 Gross per 24 hour  Intake 954 ml  Output 4376 ml  Net -3422 ml    Net IO Since Admission: -5,623.8 mL [05/04/21 0628]  Physical Exam: Physical Exam Vitals and nursing note reviewed.  Constitutional:      General: He is not in acute distress. HENT:     Head: Normocephalic and atraumatic.  Cardiovascular:     Rate and Rhythm: Normal rate and regular rhythm.  Pulmonary:     Effort: Pulmonary effort is normal. No tachypnea or accessory muscle usage.     Breath sounds: Examination of the right-lower field reveals decreased breath sounds. Examination of the left-lower field reveals decreased breath sounds. Decreased breath sounds present. Rales: At lung bases bilaterally. Abdominal:     Palpations: Abdomen is soft.   Musculoskeletal:     Right lower leg: Edema present.     Comments: Left foot covered in wrapping with wound vac attached. Minimal swelling, distal left foot is warm and pedal pulse 1+  Skin:    General: Skin is warm and dry.  Neurological:     General: No focal deficit present.     Mental Status: He is alert and oriented to person, place, and time. Mental status is at baseline.  Psychiatric:        Mood and Affect: Affect is angry.        Behavior: Behavior is cooperative.        Thought Content: Thought content normal.     Comments: Frustrated mood    ASSESSMENT/PLAN:  Assessment: Principal Problem:   Acute exacerbation of CHF (congestive heart failure) (HCC) Active Problems:   Diabetic foot ulcer (HCC)   CAD (coronary artery disease)   Osteomyelitis (HCC)   Acute osteomyelitis of left foot (HCC)   Adam James is a 50 y.o. male with a pertinent PMH of CAD, diabetes, HFrEF (25%, 2022) who is currently admitted for acute CHF exacerbation and diabetic foot wound complicated by fifth metatarsal osteomyelitis.   Plan:  Acute on chronic HFrEf 2/2 Ischemic Cardiomyopathy  Obstructive CAD History NSTEMI August 2022 Prior dry weight of 113.5 kg. Currently below that today with brisk diuresis  over the last 48 hours after Metolazone was added. Will transition to PO Torsemide today.   - Diuresis per Heart Failure Team - Continue home spironolactone 12.5mg  daily - Continue home metoprolol 25 mg daily - Entresto restarted today  - Strict I's and O's, monitor daily weights, maintain Mg >2 and K>4 - Daily BMP  Osteomyelitis of left fifth metatarsal head s/p ray amputation (04/30/2021), POD 4 Cultures are multi-microbial with staph, providencia and alcaligenes.   - Continue wound VAC for 1 week postop, per ortho - Touchdown weightbearing in a cam boot, per ortho  AKI  Hx of CKD  Stable.   - Continue to monitor renal function  - Strict in/out   High PVC Burden  - Mexiletine  started on 10/09  Type 2 diabetes - Sliding scale insulin (moderate)  Iron-Deficient Anemia - History of UGIB 2/2 erosive esophagitis in Aug 2022. Hemoglobin remains stable. Will spacing out monitoring.   Best Practice: Diet: Cardiac diet and Renal diet IVF: Fluids: NS 10cc/hr VTE: Heparin Code: Full Therapy Recs: Patient denied PT/OT DISPO: Anticipated discharge in 2-3 days to home  pending  clinical improvement .  Signature: Dr. Verdene Lennert Internal Medicine PGY-3  Pager: [phone] After 5pm on weekdays and 1pm on weekends: On Call pager [phone]  05/04/2021, 1:34 PM

## 2021-05-05 ENCOUNTER — Telehealth: Payer: Self-pay | Admitting: Orthopedic Surgery

## 2021-05-05 LAB — BASIC METABOLIC PANEL
Anion gap: 7 (ref 5–15)
BUN: 18 mg/dL (ref 6–20)
CO2: 30 mmol/L (ref 22–32)
Calcium: 8.5 mg/dL — ABNORMAL LOW (ref 8.9–10.3)
Chloride: 98 mmol/L (ref 98–111)
Creatinine, Ser: 1.61 mg/dL — ABNORMAL HIGH (ref 0.61–1.24)
GFR, Estimated: 52 mL/min — ABNORMAL LOW (ref >60)
Glucose, Bld: 207 mg/dL — ABNORMAL HIGH (ref 70–99)
Potassium: 4.1 mmol/L (ref 3.5–5.1)
Sodium: 135 mmol/L (ref 135–145)

## 2021-05-05 LAB — GLUCOSE, CAPILLARY
Glucose-Capillary: 187 mg/dL — ABNORMAL HIGH (ref 70–99)
Glucose-Capillary: 224 mg/dL — ABNORMAL HIGH (ref 70–99)
Glucose-Capillary: 197 mg/dL — ABNORMAL HIGH (ref 70–99)
Glucose-Capillary: 236 mg/dL — ABNORMAL HIGH (ref 70–99)

## 2021-05-05 MED ORDER — POTASSIUM CHLORIDE CRYS ER 20 MEQ PO TBCR
20.0000 meq | EXTENDED_RELEASE_TABLET | Freq: Every day | ORAL | Status: DC
  Administered 2021-05-05 – 2021-05-07 (×3): 20 meq via ORAL
  Filled 2021-05-05 (×3): qty 1

## 2021-05-05 MED ORDER — METOPROLOL SUCCINATE ER 25 MG PO TB24
25.0000 mg | ORAL_TABLET | Freq: Two times a day (BID) | ORAL | Status: DC
  Administered 2021-05-05 – 2021-05-06 (×2): 25 mg via ORAL
  Filled 2021-05-05 (×3): qty 1

## 2021-05-05 MED ORDER — SPIRONOLACTONE 25 MG PO TABS
25.0000 mg | ORAL_TABLET | Freq: Every day | ORAL | Status: DC
  Administered 2021-05-05 – 2021-05-07 (×3): 25 mg via ORAL
  Filled 2021-05-05 (×3): qty 1

## 2021-05-05 NOTE — Progress Notes (Signed)
Patient with brief elevation in HR up to 172.  Patient physically asymptomatic but BP initially 92/73.  Repeat BP 101/73. MD notified and gave verbal order to hold scheduled metoprolol but to give Entresto.

## 2021-05-05 NOTE — Telephone Encounter (Signed)
Someone from the hospital called. Says patient would like to be discharged and an appointment for Wednesday is needed. Her phone disconnected. Could not get her name and number before it hung up.

## 2021-05-05 NOTE — TOC Initial Note (Signed)
Transition of Care [facility]) - Initial/Assessment Note    Patient Details  Name: Adam James MRN: [id number] Date of Birth: 1971/01/09  Transition of Care [facility]) CM/SW Contact:    Elliot Cousin, RN Phone Number: [phone] 05/05/2021, 1:18 PM  Clinical Narrative:                 HF TOC CM spoke to pt and states he currently lives with his dtr. He uses Doctor, general practice for his MD appts. Pt requesting a PCP in [place]. Contacted [facility] and pt will need to complete the sliding scale application prior to clinic arranging appt for patient's without insurance. Pt agreeable to follow up at [facility]. PCP appt arranged for [facility] on 05/12/2021 at 1:30 pm. Pt's HF medication will be covered by HF funds. Pt has RW and wheelchair at home. Will need HHRN to follow up on wound. Scheduled dc tomorrow and [facility] will be charity agency. Will contact [facility] with new referral. Will need HHRN order with F2F with dressing change instructions.   Expected Discharge Plan: Home w Home Health Services Barriers to Discharge: Continued Medical Work up   Patient Goals and CMS Choice   CMS Medicare.gov Compare Post Acute Care list provided to:: Patient Choice offered to / list presented to : Patient  Expected Discharge Plan and Services Expected Discharge Plan: Home w Home Health Services In-house Referral: Clinical Social Work Discharge Planning Services: CM Consult, Follow-up appt scheduled Post Acute Care Choice: Home Health Living arrangements for the past 2 months: Apartment Expected Discharge Date: 04/30/21                         HH Arranged: RN HH Agency: [facility]        Prior Living Arrangements/Services Living arrangements for the past 2 months: Apartment Lives with:: Adult Children Patient language and need for interpreter reviewed:: Yes Do you feel safe going back to the place where you live?: Yes      Need for Family  Participation in Patient Care: No (Comment) Care giver support system in place?: No (comment) Current home services: DME (wheelchair, rolling walker, cane) Criminal Activity/Legal Involvement Pertinent to Current Situation/Hospitalization: No - Comment as needed  Activities of Daily Living Home Assistive Devices/Equipment: Walker (specify type) (4 wheel) ADL Screening (condition at time of admission) Patient's cognitive ability adequate to safely complete daily activities?: Yes Is the patient deaf or have difficulty hearing?: No Does the patient have difficulty seeing, even when wearing glasses/contacts?: No Does the patient have difficulty concentrating, remembering, or making decisions?: No Patient able to express need for assistance with ADLs?: No Does the patient have difficulty dressing or bathing?: No Independently performs ADLs?: Yes (appropriate for developmental age) Does the patient have difficulty walking or climbing stairs?: Yes Weakness of Legs: None Weakness of Arms/Hands: None  Permission Sought/Granted Permission sought to share information with : Case Manager, PCP, Family Supports Permission granted to share information with : Yes, Verbal Permission Granted  Share Information with NAME: [employer]  Permission granted to share info w AGENCY: Home Health  Permission granted to share info w Relationship: daughter  Permission granted to share info w Contact Information: [phone]  Emotional Assessment Appearance:: Appears stated age Attitude/Demeanor/Rapport: Engaged Affect (typically observed): Accepting Orientation: : Oriented to Self, Oriented to Place, Oriented to  Time, Oriented to Situation   Psych Involvement: No (comment)  Admission diagnosis:  Acute exacerbation of  CHF (congestive heart failure) (HCC) [I50.9] Acute osteomyelitis of left foot (HCC) [M86.172] Acute on chronic heart failure, unspecified heart failure type [facility]) [I50.9] Patient Active  Problem List   Diagnosis Date Noted   Acute osteomyelitis of left foot (HCC)    Acute exacerbation of CHF (congestive heart failure) (HCC) 04/28/2021   Venous stasis ulcer of right calf limited to breakdown of skin without varicose veins (HCC)    Non-pressure chronic ulcer of other part of left foot limited to breakdown of skin (HCC)    Osteomyelitis (HCC)    Acute on chronic combined systolic and diastolic CHF (congestive heart failure) (HCC)    Acute on chronic heart failure (HCC) 03/23/2021   NSTEMI (non-ST elevated myocardial infarction) (HCC)    Gastroesophageal reflux disease with esophagitis and hemorrhage    Anemia, posthemorrhagic, acute    Acute esophagitis    Gastrointestinal hemorrhage    Necrotizing fasciitis (HCC)    Left leg cellulitis    Diabetic polyneuropathy associated with type 2 diabetes mellitus (HCC)    Severe protein-calorie malnutrition (HCC)    Gangrene of left foot (HCC) 02/25/2021   Type 2 diabetes mellitus (HCC)    Heart failure (HCC)    Dyslipidemia    Diabetic foot ulcer (HCC)    CAD (coronary artery disease)    Cardiomyopathy (HCC)    Coronary artery disease involving native coronary artery of native heart with angina pectoris (HCC)    PCP:  Patient, No Pcp Per (Inactive) Pharmacy:   [facility] [address] [place] [address] Phone: [phone] Fax: [phone]  [facility] [address] [place] [address] Phone: [phone] Fax: [phone]  [facility], [place] - [address] [place] [address] [place] [address] Phone: [phone] Fax: [phone]  [facility] [address] [place] [address] Phone: [phone] Fax: [phone]     Social Determinants of Health (SDOH) Interventions Food Insecurity Interventions: Other (Comment) (pt receives SNAP benefits at this  time) Financial Strain Interventions: Artist, Other (Comment) (CAFA application provided, Disability pending with [employer]) Housing Interventions: Intervention Not Indicated Transportation Interventions: [facility]  Readmission Risk Interventions No flowsheet data found.

## 2021-05-05 NOTE — Progress Notes (Addendum)
HD 7 SUBJECTIVE:  Patient Summary: Adam James is a 50 y.o. male with a pertinent PMH of CAD, diabetes, HFrEF (25%, 2022) who presented with a left foot wound with osteomyelitis and admitted for acute CHF and diabetic foot wound with osteomyelitis.  Overnight Events: None  Interim History:  Adam James was seen and evaluated at the bedside this morning.  States that his SOB, peripheral pitting edema, and dependent edema is much improved.  He refuses PT/OT, even for evaluation of DME.  States that he does not want crutches, that he will use his home walker.  States that he lives with his daughter currently, and that he has all the support he needs at home.   Denies any other acute complaint.  OBJECTIVE:  Vital Signs: Vitals:   05/04/21 1122 05/04/21 1516 05/04/21 2020 05/05/21 0618  BP: 109/76 107/77 122/79 124/83  Pulse: 99 91 95 93  Resp: 16 20 18 18   Temp: 98.1 F (36.7 C) 98.7 F (37.1 C) 98.3 F (36.8 C) 98.1 F (36.7 C)  TempSrc: Oral Oral Oral Oral  SpO2: 95% 97% 98% 97%  Weight:    109.6 kg   Supplemental O2:  Room Air   Filed Weights   05/03/21 0600 05/04/21 0253 05/05/21 0618  Weight: 114.7 kg 110.5 kg 109.6 kg  Dry wt: 109kg   Intake/Output Summary (Last 24 hours) at 05/05/2021 1422 Last data filed at 05/05/2021 1417 Gross per 24 hour  Intake 480 ml  Output 2000 ml  Net -1520 ml   Net IO Since Admission: -8,371.8 mL [05/05/21 1422]  Physical Exam: Physical Exam Vitals and nursing note reviewed.  Constitutional:      General: He is not in acute distress. HENT:     Head: Normocephalic and atraumatic.  Cardiovascular:     Rate and Rhythm: Tachycardia present. Rhythm irregular.  Pulmonary:     Effort: Pulmonary effort is normal. No tachypnea or accessory muscle usage.     Breath sounds: Wheezing (Expiratory) present. No decreased breath sounds or rales.  Abdominal:     Palpations: Abdomen is soft.  Musculoskeletal:        General:  Swelling (Mild left foot swelling at site of surgery) present. No tenderness.     Right lower leg: No edema.     Comments: Distal left foot is warm and pedal pulse 1+, minimal bleeding noticed at site of surgery, wound VAC removed. No signs of purulent or surrounding erythema.   Skin:    General: Skin is warm and dry.  Neurological:     General: No focal deficit present.     Mental Status: He is alert and oriented to person, place, and time. Mental status is at baseline.  Psychiatric:        Behavior: Behavior is cooperative.        Thought Content: Thought content normal.     Comments: Frustrated mood    ASSESSMENT/PLAN:  Assessment: Principal Problem:   Acute exacerbation of CHF (congestive heart failure) (HCC) Active Problems:   Diabetic foot ulcer (HCC)   CAD (coronary artery disease)   Osteomyelitis (HCC)   Acute osteomyelitis of left foot (HCC)  Adam James is a 50 y.o. male with a pertinent PMH of CAD, diabetes, HFrEF (25%, 2022) who is currently admitted for acute CHF exacerbation and diabetic foot wound complicated by fifth metatarsal osteomyelitis, now s/p ray amputation.  Plan: Acute on chronic HFrEf 2/2 Ischemic Cardiomyopathy  Obstructive CAD History NSTEMI August 2022 Currently  below presumed dry weight, at 109 kg. Will transition to PO Torsemide today.  Heart failure team, has signed off. - Diuresis per Heart Failure Team - Continue home spironolactone 12.5mg  daily - Continue home metoprolol 25 mg daily - Continue Entresto BID - Strict I's and O's, monitor daily weights, maintain Mg >2 and K>4 - Daily BMP  Osteomyelitis of left fifth metatarsal head s/p ray amputation (04/30/2021), POD 5 Post surgical site, is healing well, as described above. Per ortho, wound vac may be removed.  Patient declined PT/O and crutches. States that he will use his home walker. - Wound VAC removed - Touchdown weightbearing in a cam boot, per ortho - We will have patient follow  up with ortho within 1 week of discharge.  AKI  Hx of CKD  Stable.  - Continue to monitor renal function  - Strict in/out   High PVC Burden  - Continue mexiletine started on 10/09  Type 2 diabetes - Sliding scale insulin (moderate)  Iron-Deficient Anemia - History of UGIB 2/2 erosive esophagitis in Aug 2022. Hemoglobin remains stable. Will spacing out monitoring.   Best Practice: Diet: Cardiac diet and Renal diet IVF: Fluids: NS 10cc/hr VTE: Heparin Code: Full Therapy Recs: Patient denied PT/OT DISPO: Anticipated discharge on 10/10  Signature: Princess Bruins, DO Psychiatry Resident, PGY-1 [facility] Pager: [phone]  After 5pm on weekdays and 1pm on weekends: On Call pager [phone]  05/05/2021, 2:22 PM

## 2021-05-05 NOTE — Progress Notes (Signed)
Patient ID: Adam James, male   DOB: January 16, 1971, 50 y.o.   MRN: [id number]     Advanced Heart Failure Rounding Note  PCP-Cardiologist: Norman Herrlich, MD   Subjective:    He is now back on po torsemide.  Weight down about 15 lbs since admission.  Creatinine 1.6.  Still with frequent PVCs on telemetry.   No chest pain or dyspnea.   Objective:   Weight Range: 109.6 kg Body mass index is 30.2 kg/m.   Vital Signs:   Temp:  [98 F (36.7 C)-98.7 F (37.1 C)] 98.1 F (36.7 C) (10/10 0618) Pulse Rate:  [91-101] 93 (10/10 0618) Resp:  [16-20] 18 (10/10 0618) BP: (107-124)/(72-83) 124/83 (10/10 0618) SpO2:  [94 %-98 %] 97 % (10/10 0618) Weight:  [109.6 kg] 109.6 kg (10/10 0618) Last BM Date: 05/04/21  Weight change: Filed Weights   05/03/21 0600 05/04/21 0253 05/05/21 0618  Weight: 114.7 kg 110.5 kg 109.6 kg    Intake/Output:   Intake/Output Summary (Last 24 hours) at 05/05/2021 0911 Last data filed at 05/05/2021 0500 Gross per 24 hour  Intake 477 ml  Output 2975 ml  Net -2498 ml      Physical Exam    General: NAD Neck: JVP 8 cm, no thyromegaly or thyroid nodule.  Lungs: Clear to auscultation bilaterally with normal respiratory effort. CV: Nondisplaced PMI.  Heart regular S1/S2, no S3/S4, no murmur.  No peripheral edema.   Abdomen: Soft, nontender, no hepatosplenomegaly, no distention.  Skin: Intact without lesions or rashes.  Neurologic: Alert and oriented x 3.  Psych: Normal affect. Extremities: No clubbing or cyanosis. Boot on left lower leg.  HEENT: Normal.    Telemetry   NSR with PVCs, no NSVT. Personally reviewed   Labs    CBC Recent Labs    05/03/21 0301 05/04/21 0223  WBC 7.1 9.6  NEUTROABS 3.6  --   HGB 8.5* 9.4*  HCT 25.9* 29.6*  MCV 90.2 90.2  PLT 164 217   Basic Metabolic Panel Recent Labs    89/21/19 0301 05/04/21 0223 05/05/21 0350  NA 134* 136 135  K 3.8 3.9 4.1  CL 100 98 98  CO2 27 30 30   GLUCOSE 182* 176* 207*  BUN  15 16 18   CREATININE 1.74* 1.82* 1.61*  CALCIUM 8.4* 9.0 8.5*  MG 2.1 2.0  --    Liver Function Tests No results for input(s): AST, ALT, ALKPHOS, BILITOT, PROT, ALBUMIN in the last 72 hours. No results for input(s): LIPASE, AMYLASE in the last 72 hours. Cardiac Enzymes No results for input(s): CKTOTAL, CKMB, CKMBINDEX, TROPONINI in the last 72 hours.  BNP: BNP (last 3 results) Recent Labs    03/23/21 0045 04/25/21 1505 04/28/21 1047  BNP 1,129.9* 1,691.7* 1,072.2*    ProBNP (last 3 results) Recent Labs    10/07/20 1004  PROBNP 468*     D-Dimer No results for input(s): DDIMER in the last 72 hours. Hemoglobin A1C No results for input(s): HGBA1C in the last 72 hours. Fasting Lipid Panel No results for input(s): CHOL, HDL, LDLCALC, TRIG, CHOLHDL, LDLDIRECT in the last 72 hours. Thyroid Function Tests No results for input(s): TSH, T4TOTAL, T3FREE, THYROIDAB in the last 72 hours.  Invalid input(s): FREET3  Other results:   Imaging    No results found.   Medications:     Scheduled Medications:  acetaminophen  1,000 mg Oral TID   aspirin EC  81 mg Oral Daily   atorvastatin  80 mg Oral q1800  docusate sodium  100 mg Oral BID   heparin  5,000 Units Subcutaneous Q8H   insulin aspart  0-15 Units Subcutaneous TID WC   metoprolol succinate  25 mg Oral BID   mexiletine  200 mg Oral BID   potassium chloride  20 mEq Oral BID   sacubitril-valsartan  1 tablet Oral BID   sodium chloride flush  3 mL Intravenous Q12H   spironolactone  25 mg Oral Daily   torsemide  40 mg Oral Daily    Infusions:  sodium chloride Stopped (04/30/21 1000)   sodium chloride 10 mL/hr at 05/03/21 1014   methocarbamol (ROBAXIN) IV      PRN Medications: sodium chloride, bisacodyl, HYDROmorphone (DILAUDID) injection, methocarbamol **OR** methocarbamol (ROBAXIN) IV, ondansetron **OR** ondansetron (ZOFRAN) IV, oxyCODONE, oxyCODONE, polyethylene glycol    Patient Profile   The patient  is a 50 y/o male with a medical history of CAD s/p PCI/stent 2007 in [place]. 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  Assessment/Plan   1. Acute on chronic HFrEF:  With prior concern for low output HF.  Ischemic cardiomyopathy.  ? If frequent PVCs also contributing. Initial echo in 2/22 EF 40-45%.  Echo in 7/22 EF 30-35%.  Echo in 8/22 EF down to 25% with mild RV dysfunction.  Admitted in 08/22 with a/c CHF.  Co-ox off tunneled catheter (which was removed) was 54.8% on 03/24/21, suggesting at least borderline low output.  RHC 8/22 with preserved CO, mildly elevated PCWP, mild pulmonary hypertension.  Torsemide recently restarted in outpatient setting after he had not been taking at home. Had been taking an old supply of furosemide. He was admitted with volume overloaded, has diuresed well and weight down about 15 lbs.  - Continue torsemide 40 mg daily.  - Increase spironolactone to 25 mg daily.  - Increase Toprol XL to 25 mg bid with PVCs.  - Continue Entresto 24/26 bid - Avoid SGLT2i with history of necrotizing fasciitis and poor hygiene. 2. CAD: LHC in 2/22 with 50% dLM, 70% ramus, 70% pLCx, totally occluded proximal RCA, 90% stenosis ostial to mid LAD, 90% mid LAD, totally occluded distal LAD.  Cardiac MRI in 4/22 showed EF 40% with substantial viable myocardium, only delayed enhancement was subendocardial in the mid to apical inferior wall.  CABG had been planned in 8/22, but patient was admitted left foot diabetic foot infection that progressed to necrotizing fasciitis s/p debridement and IV abx.  Now in with left 5th metatarsal osteomyelitis and still has healing surgical wound left foot/lower leg. Ideally, would have CABG.  However, need to have further healing of the left lower leg.  Repeat coronary angiography (9/22) showed minimal  change from prior study in 2/22. No chest pain.  - Has outpatient f/u with CT surgery later this month.  - Continue ASA 81 daily.  - Continue atorvastatin 80 mg daily.  3. Frequent PVCs: Reviewed telemetry, up to 20/min  -Continue beta blocker, increase to bid.  - Keep K > 4 and Mag > 2 - Added mexilitene 200 bid 4. CKD, Stage 3a:  Scr had been in 2s with improvement to ~1.4 to 1.5, but now stable at 1.7-1.8 5. GI bleeding: History of erosive esophagitis earlier in 8/22.  Had been started on protonix during prior admit but no longer taking.  Hgb stable.  - Resume Protonix.  6. Left leg diabetic foot  infection => necrotizing fasciitis in August 2022.  Currently admitted with left 5th metatarsal osteomyelitis s/p amputation. Completed cefepime and vancomycin on 10/7.  Prior surgical incision LLE healing. 7. DM2: Per primary team 8. SDOH:  -He has filled out paperwork for disability and medicaid. Difficulty paying for medications. -Mother helps with transportation to appts, has been given [facility] number for future appts. -He needs a PCP near [place] area. -Lives in [place] so no Paramedicine. -TOC consult - Plan to send HF meds to [facility] at discharge.  Think he can go home when stable from standpoint of his surgery.  He should go home on torsemide 40 daily, ASA 81, atorvastatin 80, Entresto 24/26 bid, spironolactone 25 daily, Toprol XL 25 bid, mexiletine 200 bid, KCl 20 daily. Followup CHF clinic.     Length of Stay: 7  Marca Ancona, MD  05/05/2021, 9:11 AM  Advanced Heart Failure Team Pager [phone] (M-F; 7a - 5p)  Please contact [facility] for night-coverage after hours (5p -7a ) and weekends on [email or web address]

## 2021-05-06 ENCOUNTER — Other Ambulatory Visit (HOSPITAL_COMMUNITY): Payer: Self-pay

## 2021-05-06 LAB — BASIC METABOLIC PANEL
Anion gap: 6 (ref 5–15)
BUN: 16 mg/dL (ref 6–20)
CO2: 30 mmol/L (ref 22–32)
Calcium: 8.3 mg/dL — ABNORMAL LOW (ref 8.9–10.3)
Chloride: 100 mmol/L (ref 98–111)
Creatinine, Ser: 1.7 mg/dL — ABNORMAL HIGH (ref 0.61–1.24)
GFR, Estimated: 49 mL/min — ABNORMAL LOW (ref >60)
Glucose, Bld: 202 mg/dL — ABNORMAL HIGH (ref 70–99)
Potassium: 4.1 mmol/L (ref 3.5–5.1)
Sodium: 136 mmol/L (ref 135–145)

## 2021-05-06 LAB — MAGNESIUM: Magnesium: 1.8 mg/dL (ref 1.7–2.4)

## 2021-05-06 LAB — AEROBIC/ANAEROBIC CULTURE W GRAM STAIN (SURGICAL/DEEP WOUND): Gram Stain: NONE SEEN

## 2021-05-06 LAB — GLUCOSE, CAPILLARY
Glucose-Capillary: 199 mg/dL — ABNORMAL HIGH (ref 70–99)
Glucose-Capillary: 230 mg/dL — ABNORMAL HIGH (ref 70–99)
Glucose-Capillary: 176 mg/dL — ABNORMAL HIGH (ref 70–99)
Glucose-Capillary: 235 mg/dL — ABNORMAL HIGH (ref 70–99)

## 2021-05-06 MED ORDER — MAGNESIUM SULFATE 2 GM/50ML IV SOLN
2.0000 g | Freq: Once | INTRAVENOUS | Status: AC
  Administered 2021-05-06: 2 g via INTRAVENOUS
  Filled 2021-05-06: qty 50

## 2021-05-06 NOTE — Discharge Summary (Signed)
Name: Adam James   MRN: [id number] DOB: 25-Nov-1970 50 y.o. PCP: Patient, No Pcp Per (Inactive)  Date of Admission: 04/28/2021 10:29 AM Date of Discharge: 05/07/2021 Attending Physician: Dr. Charissa Bash  Discharge Diagnosis: Principal Problem:   Acute exacerbation of CHF (congestive heart failure) (HCC) Active Problems:   Diabetic foot ulcer (HCC)   CAD (coronary artery disease)   Osteomyelitis (HCC)   Acute osteomyelitis of left foot [facility])   Discharge Medications: Allergies as of 05/07/2021       Reactions   Morphine Itching        Medication List     STOP taking these medications    amLODipine 5 MG tablet Commonly known as: NORVASC       TAKE these medications    Aspirin Low Dose 81 MG EC tablet Generic drug: aspirin Take 1 tablet (81 mg total) by mouth daily. What changed: additional instructions   atorvastatin 80 MG tablet Commonly known as: LIPITOR Take 1 tablet (80 mg total) by mouth daily.   Entresto 24-26 MG Generic drug: sacubitril-valsartan Take 1 tablet by mouth 2 (two) times daily.   glyBURIDE 5 MG tablet Commonly known as: DIABETA Take 5 mg by mouth 2 (two) times daily.   metFORMIN 500 MG tablet Commonly known as: GLUCOPHAGE Take 500 mg by mouth 2 (two) times daily.   metoprolol succinate 25 MG 24 hr tablet Commonly known as: TOPROL-XL Take 1 tablet (25 mg total) by mouth every evening. What changed:  medication strength how much to take when to take this additional instructions   mexiletine 200 MG capsule Commonly known as: MEXITIL Take 1 capsule (200 mg total) by mouth 2 (two) times daily.   potassium chloride SA 20 MEQ tablet Commonly known as: KLOR-CON Take 1 tablet (20 mEq total) by mouth daily.   spironolactone 25 MG tablet Commonly known as: ALDACTONE Take 1 tablet (25 mg total) by mouth daily. What changed: how much to take   torsemide 20 MG tablet Commonly known as: DEMADEX Take 2 tablets (40 mg total)  by mouth daily. Start taking on: May 08, 2021               Durable Medical Equipment  (From admission, onward)           Start     Ordered   05/06/21 9379  For home use only DME Vest life vest  Once       Comments: LVEF < 25%, ischemic CM, duration 3 months   05/06/21 0240              Discharge Care Instructions  (From admission, onward)           Start     Ordered   05/07/21 0000  Discharge wound care:       Comments: Keep covered with 4x4 dry gauze and wrap with ACE band. Keep clean and dry.   05/07/21 1056   04/30/21 0000  Touch down weight bearing       Question Answer Comment  Laterality left   Extremity Lower      04/30/21 1202            Disposition and follow-up:   [name] was discharged from [facility] in Stable condition. At the hospital follow up visit please address:  Acute on chronic HFrEf 2/2 Ischemic Cardiomyopathy  Dry weight 109 kg Osteomyelitis of left fifth metatarsal head s/p ray amputation (04/30/2021) AKI  High PVC Burden  Discharge with Lifevest due to severely reduced LV function and mexiletine 200 MG capsule Type 2 diabetes Iron-Deficient Anemia Labs / imaging needed at time of follow-up: CBC, BMP, magnesium Pending labs/ test needing follow-up: None  Follow-up Appointments:  Follow-up Information     [name], MD Follow up in 1 week(s).   Specialty: Orthopedic Surgery Contact information: [address] [place] [address] [phone]         [facility] Follow up on 05/12/2021.   Specialty: Cardiology Why: Advanced Heart Failure Clinic at [facility] 3:30 pm Entrance C, Garage Code 3333 Contact information: [address] [id number] mc [address] [place] [address] [phone]        Healthcare, [facility] Family Follow up.   Specialty: Family Medicine Why: please complete sliding fee application and  submit. the office will arrange an appt once paperwork has been completed to establish with Primary Care Physician in [place]. you have an appt with [facility] in [place] to follow up with PCP until you are able to establish with [facility] information: [address] [place] [address] [phone]                 Hospital Course by problem list: Adam James is a 50 y.o. male with a pertinent PMH of CAD, diabetes, HFrEF (25%, 2022) who is currently admitted for acute CHF exacerbation and diabetic foot wound complicated by fifth metatarsal osteomyelitis, now s/p ray amputation. LOS: 9 days  Patient was admitted for management of acute on chronic heart failure exacerbation, also presented with osteomyelitis of left fifth metatarsal.  Patient was diuresed with IV Lasix, however became hypotensive while still hypervolemic.  Heart failure team was consulted.  Home metoprolol 50 mg was decreased to 25 mg.  Restarted patient's spironolactone 12.5 mg, started metolazone 2.5 mg x2 days.  Ray amputation was on 10/5, had resultant foot drop.  No surgical complications.  Patient did refuse PT/OT, stating that he is clumsy on crutches.  Consults: Heart failure team, orthopedic  Plan: Acute on chronic HFrEf 2/2 Ischemic Cardiomyopathy  Obstructive CAD History NSTEMI August 2022 Currently below presumed dry weight, at 109 kg. - Heart Failure Team following  - Continue Torsemide 40 mg daily  - Continue home spironolactone 12.5mg  daily - Continue home metoprolol 25 mg daily - Continue Entresto BID - Strict I's and O's, monitor daily weights - Avoid SGLT2i with history of necrotizing fasciitis and poor hygiene.   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 Left foot drop 2/2 to surgery.  Was on IV vancomycin and cefepime for 3 days post procedure.  Had a wound VAC for 1 week post  procedure.  Patient declined PT/O and crutches. States that he will use his home walker.  - Wound VAC removed 10/10 - IV vancomycin and cefepime completed on 10/8 - Touchdown weightbearing in a cam boot, per ortho - We will have patient follow up with ortho within 1 week of discharge.   AKI  Hx of CKD  Stable.  - Continue to monitor renal function  - Strict in/out    High PVC Burden  Likely 2/2 to HFrEF, ischemic cardiomyopathy, obstructive CAD. Cardiology recommended fitting patient for a life vest. Will have patient follow-up with cardiology OP after discharge - Continue mexiletine started on 10/09 - Life Vest ordered  Type 2 diabetes - Sliding scale insulin (moderate)  Iron-Deficient Anemia - History of UGIB 2/2 erosive esophagitis in Aug 2022.    Discharge Exam:   BP 100/70 (BP Location: Right Arm)   Pulse 95   Temp 97.8 F (36.6 C) (Oral)   Resp 16   Wt 109.7 kg   SpO2 96%   BMI 30.24 kg/m   Physical Exam Vitals and nursing note reviewed.  Constitutional:      General: He is not in acute distress. HENT:     Head: Normocephalic and atraumatic.  Eyes:     Extraocular Movements: Extraocular movements intact.  Cardiovascular:     Rate and Rhythm: Normal rate. Rhythm irregular.  Pulmonary:     Effort: Pulmonary effort is normal.     Breath sounds: Wheezing present.  Musculoskeletal:     Right lower leg: No edema.     Left lower leg: No edema.     Comments: 5th metatarsal amputation bilateral  Skin:    General: Skin is warm and dry.  Neurological:     Mental Status: He is alert and oriented to person, place, and time.     Comments: Right foot drop  Psychiatric:     Comments: Mood and affect dysphoric     Pertinent Labs, Studies, and Procedures:  DG Foot Complete Left  Result Date: 04/28/2021 CLINICAL DATA:  Chronic wound over the fifth toe EXAM: LEFT FOOT - COMPLETE 3+ VIEW COMPARISON:  02/24/2021 FINDINGS: No acute fracture or dislocation. Generalized  osteopenia. Soft tissue wound of the lateral aspect of the foot. Erosive changes involving the fifth metatarsal head and mild periostitis concerning for osteomyelitis. IMPRESSION: 1. Osteomyelitis of the fifth metatarsal head. Electronically Signed   By: Elige Ko M.D.   On: 04/28/2021 12:54     04/30/2021  Procedure(s): LEFT FOOT 5TH RAY AMPUTATION APPLICATION OF A-CELL POWDER    Discharge Instructions:   Discharge Instructions      Dear Mr. Jaquith, I am so glad you are feeling better and can be discharged today (05/06/2021)! You were admitted because of having too much fluid on your lung making it hard to breathe.   Please see the following instructions: For your: Please see below for your HOSPITAL FOLLOW-UP APPOINTMENTS with your doctors. Please call Dr. Audrie Lia office to make an appointment in 1 week Follow up appointments are already scheduled with your primary care doctor, cardiothoracic surgeon, and cardiologist.  WOUND Do not bear weight on left foot and wash with soap and water daily. Then re-wrap with dry bandage.  Do not submerge wound in water Please call your doctor if there are any signs of pus, odor, worsening redness, worsening pain,    It was a pleasure meeting you, Mr. Schrom.  I wish you and your family the best, and hope you stay happy and healthy!  Princess Bruins, DO     Signed: Princess Bruins, DO Psychiatry Resident, PGY-1 [facility] Teaching Service 05/07/2021, 5:26 PM   Pager: [phone]

## 2021-05-06 NOTE — Progress Notes (Signed)
Inpatient Diabetes Program Recommendations  AACE/ADA: New Consensus Statement on Inpatient Glycemic Control   Target Ranges:  Prepandial:   less than 140 mg/dL      Peak postprandial:   less than 180 mg/dL (1-2 hours)      Critically ill patients:  140 - 180 mg/dL  Results for EULIS, SALAZAR (MRN [id number]) as of 05/06/2021 13:21  Ref. Range 05/05/2021 06:13 05/05/2021 11:46 05/05/2021 15:47 05/05/2021 20:35 05/06/2021 05:23 05/06/2021 11:31  Glucose-Capillary Latest Ref Range: 70 - 99 mg/dL 756 (H) 433 (H) 295 (H) 236 (H) 199 (H) 230 (H)    Review of Glycemic Control  Diabetes history: DM2 Outpatient Diabetes medications: Glyburide 5 mg BID, Metformin 500 mg BID Current orders for Inpatient glycemic control: Novolog 0-15 units TID with meals  Inpatient Diabetes Program Recommendations:    Insulin: Please consider ordering Novolog 0-5 units QHS for bedtime correction and ordering Novolog 3 units TID with meals for meal coverage if patient eats at least 50% of meals.  Thanks, Orlando Penner, RN, MSN, CDE Diabetes Coordinator Inpatient Diabetes Program [phone] (Team Pager from 8am to 5pm)

## 2021-05-06 NOTE — Progress Notes (Addendum)
Patient ID: Adam James, male   DOB: 1971/06/10, 50 y.o.   MRN: [id number]     Advanced Heart Failure Rounding Note  PCP-Cardiologist: Norman Herrlich, MD   Subjective:   Documented episode of tachycardia with HR of 172 around 11 pm last night. Unable to find any rhythm strips on telemetry to correlate with event. SBP soft in 90s, metoprolol was held.   On 40 mg Torsemide daily. Down 16 lb this admit. Volume looks okay today.  Scr stable at 1.7, K 4.1.   BP okay this am.   Objective:   Weight Range: 109 kg Body mass index is 30.02 kg/m.   Vital Signs:   Temp:  [98 F (36.7 C)-98.3 F (36.8 C)] 98.3 F (36.8 C) (10/11 0852) Pulse Rate:  [93-99] 98 (10/11 0852) Resp:  [16-18] 16 (10/11 0852) BP: (92-112)/(65-89) 112/77 (10/11 0852) SpO2:  [97 %-98 %] 97 % (10/11 0852) Weight:  [109 kg] 109 kg (10/11 0526) Last BM Date: 05/05/21  Weight change: Filed Weights   05/04/21 0253 05/05/21 0618 05/06/21 0526  Weight: 110.5 kg 109.6 kg 109 kg    Intake/Output:   Intake/Output Summary (Last 24 hours) at 05/06/2021 0855 Last data filed at 05/06/2021 0853 Gross per 24 hour  Intake 963 ml  Output 1850 ml  Net -887 ml      Physical Exam    General:  Lying comfortably in bed.  HEENT: normal Neck: supple. no JVD. Carotids 2+ bilat; no bruits. No lymphadenopathy or thryomegaly appreciated. Cor: PMI nondisplaced. Regular rate & rhythm with ectopy. No rubs, gallops or murmurs. Lungs: clear Abdomen: soft, nontender, nondistended. No hepatosplenomegaly. No bruits or masses. Good bowel sounds. Extremities: no cyanosis, clubbing, rash, boot on LLE, trace edema on right Neuro: alert & orientedx3, cranial nerves grossly intact. moves all 4 extremities w/o difficulty. Affect pleasant    Telemetry   NSR 90s with 25 PVCs/min (personally reviewed)   Labs    CBC Recent Labs    05/04/21 0223  WBC 9.6  HGB 9.4*  HCT 29.6*  MCV 90.2  PLT 217   Basic Metabolic  Panel Recent Labs    05/04/21 0223 05/05/21 0350 05/06/21 0233  NA 136 135 136  K 3.9 4.1 4.1  CL 98 98 100  CO2 30 30 30   GLUCOSE 176* 207* 202*  BUN 16 18 16   CREATININE 1.82* 1.61* 1.70*  CALCIUM 9.0 8.5* 8.3*  MG 2.0  --   --    Liver Function Tests No results for input(s): AST, ALT, ALKPHOS, BILITOT, PROT, ALBUMIN in the last 72 hours. No results for input(s): LIPASE, AMYLASE in the last 72 hours. Cardiac Enzymes No results for input(s): CKTOTAL, CKMB, CKMBINDEX, TROPONINI in the last 72 hours.  BNP: BNP (last 3 results) Recent Labs    03/23/21 0045 04/25/21 1505 04/28/21 1047  BNP 1,129.9* 1,691.7* 1,072.2*    ProBNP (last 3 results) Recent Labs    10/07/20 1004  PROBNP 468*     D-Dimer No results for input(s): DDIMER in the last 72 hours. Hemoglobin A1C No results for input(s): HGBA1C in the last 72 hours. Fasting Lipid Panel No results for input(s): CHOL, HDL, LDLCALC, TRIG, CHOLHDL, LDLDIRECT in the last 72 hours. Thyroid Function Tests No results for input(s): TSH, T4TOTAL, T3FREE, THYROIDAB in the last 72 hours.  Invalid input(s): FREET3  Other results:   Imaging    No results found.   Medications:     Scheduled Medications:  acetaminophen  1,000  mg Oral TID   aspirin EC  81 mg Oral Daily   atorvastatin  80 mg Oral q1800   docusate sodium  100 mg Oral BID   heparin  5,000 Units Subcutaneous Q8H   insulin aspart  0-15 Units Subcutaneous TID WC   metoprolol succinate  25 mg Oral BID   mexiletine  200 mg Oral BID   potassium chloride  20 mEq Oral Daily   sacubitril-valsartan  1 tablet Oral BID   sodium chloride flush  3 mL Intravenous Q12H   spironolactone  25 mg Oral Daily   torsemide  40 mg Oral Daily    Infusions:  sodium chloride Stopped (04/30/21 1000)   sodium chloride 10 mL/hr at 05/03/21 1014   methocarbamol (ROBAXIN) IV      PRN Medications: sodium chloride, bisacodyl, HYDROmorphone (DILAUDID) injection,  methocarbamol **OR** methocarbamol (ROBAXIN) IV, ondansetron **OR** ondansetron (ZOFRAN) IV, oxyCODONE, oxyCODONE, polyethylene glycol    Patient Profile   The patient is a 50 y/o male with a medical history of CAD s/p PCI/stent 2007 in [place]. 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  Assessment/Plan   1. Acute on chronic HFrEF:  With prior concern for low output HF.  Ischemic cardiomyopathy.  ? If frequent PVCs also contributing. Initial echo in 2/22 EF 40-45%.  Echo in 7/22 EF 30-35%.  Echo in 8/22 EF down to 25% with mild RV dysfunction.  Admitted in 08/22 with a/c CHF.  Co-ox off tunneled catheter (which was removed) was 54.8% on 03/24/21, suggesting at least borderline low output.  RHC 8/22 with preserved CO, mildly elevated PCWP, mild pulmonary hypertension.  Torsemide recently restarted in outpatient setting after he had not been taking at home. Had been taking an old supply of furosemide. He was admitted with volume overloaded, has diuresed well and weight down about 16 lbs.  - Continue torsemide 40 mg daily.  - Continue spiro 25 mg daily  - Metoprolol xl held last night d/t ? Tachycardia and low BP. Unable to confirm episode of tachycardia on tele. BP okay. Add back metoprolol xl at 25 mg BID.  - Continue Entresto 24/26 bid - Avoid SGLT2i with history of necrotizing fasciitis and poor hygiene. - Will arrange for LifeVest today d/t severely reduced LV function and very frequent PVCs. Discussed with [employer] rep. 2. CAD: LHC in 2/22 with 50% dLM, 70% ramus, 70% pLCx, totally occluded proximal RCA, 90% stenosis ostial to mid LAD, 90% mid LAD, totally occluded distal LAD.  Cardiac MRI in 4/22 showed EF 40% with substantial viable myocardium, only delayed enhancement was subendocardial in the mid to apical inferior wall.  CABG had  been planned in 8/22, but patient was admitted left foot diabetic foot infection that progressed to necrotizing fasciitis s/p debridement and IV abx.  Now in with left 5th metatarsal osteomyelitis and still has healing surgical wound left foot/lower leg. Ideally, would have CABG.  However, need to have further healing of the left lower leg.  Repeat coronary angiography (9/22) showed minimal change from prior study in 2/22. No chest pain.  - Has outpatient f/u with CT surgery later this month.  - Continue ASA 81 daily.  - Continue atorvastatin 80 mg daily.  3. Frequent PVCs: Reviewed telemetry, up to 25/min  -Restart beta blocker.  - Keep K > 4 and Mag > 2 - Now on mexiletine 200  mg BID - LifeVest as above 4. CKD, Stage 3a:  Scr had been in 2s with improvement to ~1.4 to 1.5, but now stable at 1.7-1.8 5. GI bleeding: History of erosive esophagitis earlier in 8/22.  Had been started on protonix during prior admit but no longer taking.  Hgb stable.  - Resume Protonix.  6. Left leg diabetic foot infection => necrotizing fasciitis in August 2022.  Currently admitted with left 5th metatarsal osteomyelitis s/p amputation. Completed cefepime and vancomycin on 10/7.  Prior surgical incision LLE healing. 7. DM2: Per primary team 8. SDOH:  -He has filled out paperwork for disability and medicaid. Difficulty paying for medications. -Mother helps with transportation to appts, has been given [facility] number for future appts. -TOC assisting with setting up PCP. -Lives in [place] so no Paramedicine. -TOC consult. - Plan to send HF meds to [facility] at discharge.  He should go home on torsemide 40 daily, ASA 81, atorvastatin 80, Entresto 24/26 bid, spironolactone 25 daily, Toprol XL 25 bid, mexiletine 200 bid, KCl 20 daily.   Followup CHF clinic arranged.   Should get LifeVest applied prior to discharge, rep will likely arrive this afternoon.     Length of Stay: 8  FINCH, LINDSAY N, PA-C  05/06/2021,  8:55 AM  Advanced Heart Failure Team Pager [phone] (M-F; 7a - 5p)  Please contact [facility] for night-coverage after hours (5p -7a ) and weekends on [email or web address]   Patient seen with PA, agree with the above note.   Stable today, denies dyspnea.  Creatinine 1.7.  Still with frequent PVCs.   General: NAD Neck: JVP 8 cm, no thyromegaly or thyroid nodule.  Lungs: Clear to auscultation bilaterally with normal respiratory effort. CV: Nondisplaced PMI.  Heart regular S1/S2, no S3/S4, no murmur.  No peripheral edema.   Abdomen: Soft, nontender, no hepatosplenomegaly, no distention.  Skin: Intact without lesions or rashes.  Neurologic: Alert and oriented x 3.  Psych: Normal affect. Extremities: No clubbing or cyanosis. Left leg in boot.  HEENT: Normal.   Volume status looks ok on po torsemide.  I think he is ready to go home from a cardiac standpoint.  He will be discharged on the above medications.  With frequent PVCs and ischemic cardiomyopathy, we are going to arrange for Lifevest to be worn after discharge until he is able to have his CABG.   Marca Ancona 05/06/2021 1:10 PM

## 2021-05-06 NOTE — TOC Progression Note (Addendum)
Transition of Care [facility]) - Progression Note    Patient Details  Name: Adam James MRN: [id number] Date of Birth: Oct 24, 1970  Transition of Care [facility]) CM/SW Contact  Adam James, LCSWA Phone Number: 05/06/2021, 12:03 PM  Clinical Narrative:    HF CSW spoke with [facility] and updated the ride to the first appointment at community health and wellness at 1:50pm on 05/12/21 Monday and then to the [facility] appointment at 3:30pm the same day. [name] has the number for [facility] to utilize the service.  CSW will continue to follow throughout discharge.   Expected Discharge Plan: Home w Home Health Services Barriers to Discharge: Continued Medical Work up  Expected Discharge Plan and Services Expected Discharge Plan: Home w Home Health Services In-house Referral: Clinical Social Work Discharge Planning Services: CM Consult, Follow-up appt scheduled Post Acute Care Choice: Home Health Living arrangements for the past 2 months: Apartment Expected Discharge Date: 04/30/21                         HH Arranged: RN HH Agency: [facility]         Social Determinants of Health (SDOH) Interventions Food Insecurity Interventions: Other (Comment) (pt receives SNAP benefits at this time) Financial Strain Interventions: Artist, Other (Comment) (CAFA application provided, Disability pending with [employer]) Housing Interventions: Intervention Not Indicated Transportation Interventions: [facility]  Readmission Risk Interventions No flowsheet data found.  Adam James, MSW, LCSWA [phone] Heart Failure Social Worker

## 2021-05-06 NOTE — Progress Notes (Signed)
Patient ID: Adam James, male   DOB: 08/12/1970, 50 y.o.   MRN: [id number] Patient is seen prior to discharge.  Discussed the importance of minimizing weightbearing and maximizing elevation.  Recommended washing his foot with soap and water or other wound care cleaning products daily apply a dry dressing.  Discussed the risk of wound dehiscence with weightbearing or swelling.  I will follow-up in the office in 1 week.  He will call if there is any questions.

## 2021-05-06 NOTE — TOC CM/SW Note (Addendum)
HF TOC CM referral received for Life Vest. Spoke to [facility], Alvino Chapel with new referral. Faxed facesheet, progress note, orders, and test results.   Contacted [facility] rep, Stacie with new referral. [facility] accepted referral for [facility].   Isidoro Donning RN3 CCM, Heart Failure TOC CM [phone]

## 2021-05-06 NOTE — Progress Notes (Signed)
HD 8 SUBJECTIVE:  Patient Summary: Adam James is a 50 y.o. male with a pertinent PMH of CAD, diabetes, HFrEF (25%, 2022) who presented with a left foot wound with osteomyelitis and admitted for acute CHF and diabetic foot wound with osteomyelitis.  Overnight Events: Home metoprolol was held last night due to hypotension. It was resumed this AM, as BP are stable.  Interim History:  Adam James was seen and evaluated at the bedside this morning. States that he does not feel fluid overloaded. States that he saw the cardiology team and orthopedic team, this AM.  He understands that he may be staying the night for the LifeVest placement, and the reason for touch only weight bearing instruction from ortho. States that his daughter is picking him up, and that she needs to be leaving the hospital by 12p to pick up his nephew. He denies CP, palpitations, SOB, and pain.  OBJECTIVE:  Vital Signs: Vitals:   05/05/21 2134 05/06/21 0028 05/06/21 0526 05/06/21 0852  BP: 101/73 105/65 106/74 112/77  Pulse:   99 98  Resp:   18 16  Temp:   98.3 F (36.8 C) 98.3 F (36.8 C)  TempSrc:   Oral Oral  SpO2:   97% 97%  Weight:   109 kg    Supplemental O2:  Room Air   Filed Weights   05/04/21 0253 05/05/21 0618 05/06/21 0526  Weight: 110.5 kg 109.6 kg 109 kg  Dry wt: 109kg   Intake/Output Summary (Last 24 hours) at 05/06/2021 1151 Last data filed at 05/06/2021 0853 Gross per 24 hour  Intake 963 ml  Output 1450 ml  Net -487 ml   Net IO Since Admission: -9,098.8 mL [05/06/21 1151]  Physical Exam: Physical Exam Vitals and nursing note reviewed.  Constitutional:      General: He is not in acute distress.    Appearance: He is not ill-appearing.  Cardiovascular:     Rate and Rhythm: Normal rate. Rhythm irregular.     Pulses:          Dorsalis pedis pulses are 1+ on the right side.  Pulmonary:     Effort: Pulmonary effort is normal.     Breath sounds: Normal breath sounds. No  rales.  Abdominal:     General: There is no distension.  Musculoskeletal:     Right lower leg: No edema.     Comments: Left lower extremity is inside boot, wound vac removed 10/10. Distal left lower extremity warm to touch  No depended edema noted on the thighs or peri-popliteal region bilaterally     Right Lower Extremity: (5th metatarsal)    Left Lower Extremity: (5th metatarsal) Skin:    General: Skin is warm and dry.  Neurological:     Mental Status: He is alert and oriented to person, place, and time. Mental status is at baseline.  Psychiatric:     Comments: Mood and affect dysphoric, but cooperative with encounter      ASSESSMENT/PLAN:  Assessment: Principal Problem:   Acute exacerbation of CHF (congestive heart failure) (HCC) Active Problems:   Diabetic foot ulcer (HCC)   CAD (coronary artery disease)   Osteomyelitis (HCC)   Acute osteomyelitis of left foot (HCC)  Adam James is a 50 y.o. male with a pertinent PMH of CAD, diabetes, HFrEF (25%, 2022) who is currently admitted for acute CHF exacerbation and diabetic foot wound complicated by fifth metatarsal osteomyelitis, now s/p ray amputation.  Plan: Acute on chronic HFrEf 2/2 Ischemic  Cardiomyopathy  Obstructive CAD History NSTEMI August 2022 Currently below presumed dry weight, at 109 kg. - Heart Failure Team following  - Continue Torsemide 40 mg daily  - Continue home spironolactone 12.5mg  daily - Continue home metoprolol 25 mg daily - Continue Entresto BID - Strict I's and O's, monitor daily weights  Osteomyelitis of left fifth metatarsal head s/p ray amputation (04/30/2021), 6 Days Post-Op  Wound VAC removed, now in cam boot. Left foot drop 2/2 to surgery. Patient declined PT/O and crutches. States that he will use his home walker. - Wound VAC removed 10/10 - Touchdown weightbearing in a cam boot, per ortho - We will have patient follow up with ortho within 1 week of discharge.  AKI  Hx of CKD   Stable.  - Continue to monitor renal function  - Strict in/out   High PVC Burden  Likely 2/2 to HFrEF, ischemic cardiomyopathy, obstructive CAD. Cardiology recommended fitting patient for a life vest. Will have patient follow-up with cardiology OP after discharge - Continue mexiletine started on 10/09 - Life Vest ordered  Type 2 diabetes - Sliding scale insulin (moderate)  Iron-Deficient Anemia - History of UGIB 2/2 erosive esophagitis in Aug 2022.    Best Practice: Diet: Cardiac diet and Renal diet IVF: Fluids: NS 10cc/hr VTE: Heparin Code: Full Therapy Recs: Patient denied PT/OT DISPO: Anticipated discharge on 10/12  Signature: Princess Bruins, DO Psychiatry Resident, PGY-1 [facility] Teaching Service Pager: [phone]  After 5pm on weekdays and 1pm on weekends: On Call pager [phone]  05/06/2021, 11:51 AM

## 2021-05-06 NOTE — Telephone Encounter (Signed)
Dr. Lajoyce Corners addressed. He is going home today.

## 2021-05-06 NOTE — Discharge Instructions (Addendum)
Dear Mr. Cowley, I am so glad you are feeling better and can be discharged today (05/06/2021)! You were admitted because of having too much fluid on your lung making it hard to breathe.   Please see the following instructions: For your: Please see below for your HOSPITAL FOLLOW-UP APPOINTMENTS with your doctors. Please call Dr. Audrie Lia office to make an appointment in 1 week Follow up appointments are already scheduled with your primary care doctor, cardiothoracic surgeon, and cardiologist.  WOUND Do not bear weight on left foot and wash with soap and water daily. Then re-wrap with dry bandage.  Do not submerge wound in water Please call your doctor if there are any signs of pus, odor, worsening redness, worsening pain,    It was a pleasure meeting you, Mr. Harr.  I wish you and your family the best, and hope you stay happy and healthy!  Princess Bruins, DO

## 2021-05-07 ENCOUNTER — Other Ambulatory Visit (HOSPITAL_COMMUNITY): Payer: Self-pay

## 2021-05-07 LAB — BASIC METABOLIC PANEL
Anion gap: 8 (ref 5–15)
BUN: 17 mg/dL (ref 6–20)
CO2: 27 mmol/L (ref 22–32)
Calcium: 8.4 mg/dL — ABNORMAL LOW (ref 8.9–10.3)
Chloride: 100 mmol/L (ref 98–111)
Creatinine, Ser: 1.65 mg/dL — ABNORMAL HIGH (ref 0.61–1.24)
GFR, Estimated: 51 mL/min — ABNORMAL LOW (ref >60)
Glucose, Bld: 194 mg/dL — ABNORMAL HIGH (ref 70–99)
Potassium: 4.3 mmol/L (ref 3.5–5.1)
Sodium: 135 mmol/L (ref 135–145)

## 2021-05-07 LAB — GLUCOSE, CAPILLARY
Glucose-Capillary: 207 mg/dL — ABNORMAL HIGH (ref 70–99)
Glucose-Capillary: 217 mg/dL — ABNORMAL HIGH (ref 70–99)
Glucose-Capillary: 220 mg/dL — ABNORMAL HIGH (ref 70–99)

## 2021-05-07 MED ORDER — SACUBITRIL-VALSARTAN 24-26 MG PO TABS
1.0000 | ORAL_TABLET | Freq: Two times a day (BID) | ORAL | 0 refills | Status: DC
  Filled 2021-05-07: qty 60, 30d supply, fill #0

## 2021-05-07 MED ORDER — METOPROLOL SUCCINATE ER 25 MG PO TB24
25.0000 mg | ORAL_TABLET | Freq: Every evening | ORAL | 0 refills | Status: DC
  Filled 2021-05-07: qty 30, 30d supply, fill #0

## 2021-05-07 MED ORDER — POTASSIUM CHLORIDE CRYS ER 20 MEQ PO TBCR
20.0000 meq | EXTENDED_RELEASE_TABLET | Freq: Every day | ORAL | 0 refills | Status: DC
  Filled 2021-05-07: qty 30, 30d supply, fill #0

## 2021-05-07 MED ORDER — ASPIRIN 81 MG PO TBEC
81.0000 mg | DELAYED_RELEASE_TABLET | Freq: Every day | ORAL | 0 refills | Status: DC
  Filled 2021-05-07: qty 30, 30d supply, fill #0

## 2021-05-07 MED ORDER — ATORVASTATIN CALCIUM 80 MG PO TABS
80.0000 mg | ORAL_TABLET | Freq: Every day | ORAL | 0 refills | Status: DC
  Filled 2021-05-07: qty 30, 30d supply, fill #0

## 2021-05-07 MED ORDER — TORSEMIDE 20 MG PO TABS
40.0000 mg | ORAL_TABLET | Freq: Every day | ORAL | 0 refills | Status: DC
  Filled 2021-05-07: qty 60, 30d supply, fill #0

## 2021-05-07 MED ORDER — METOPROLOL SUCCINATE ER 25 MG PO TB24: 25.0000 mg | ORAL_TABLET | Freq: Every evening | ORAL | Status: DC

## 2021-05-07 MED ORDER — SPIRONOLACTONE 25 MG PO TABS
25.0000 mg | ORAL_TABLET | Freq: Every day | ORAL | 0 refills | Status: DC
  Filled 2021-05-07: qty 30, 30d supply, fill #0

## 2021-05-07 MED ORDER — SPIRONOLACTONE 25 MG PO TABS: 12.5000 mg | ORAL_TABLET | Freq: Every day | ORAL | 0 refills | Status: DC

## 2021-05-07 MED ORDER — MEXILETINE HCL 200 MG PO CAPS
200.0000 mg | ORAL_CAPSULE | Freq: Two times a day (BID) | ORAL | 0 refills | Status: DC
  Filled 2021-05-07: qty 60, 30d supply, fill #0

## 2021-05-07 MED ORDER — PANTOPRAZOLE SODIUM 40 MG PO TBEC
40.0000 mg | DELAYED_RELEASE_TABLET | Freq: Every day | ORAL | Status: DC
  Administered 2021-05-07: 40 mg via ORAL
  Filled 2021-05-07: qty 1

## 2021-05-07 NOTE — Plan of Care (Signed)

## 2021-05-07 NOTE — Progress Notes (Signed)
Patient ID: Adam James, male   DOB: [birth year], 50 y.o.   MRN: [id number]     Advanced Heart Failure Rounding Note  PCP-Cardiologist: Norman Herrlich, MD   Subjective:    No complaints today.  SBP 90s-100s, not dizzy.    No labs.   Objective:   Weight Range: 109.7 kg Body mass index is 30.24 kg/m.   Vital Signs:   Temp:  [97.2 F (36.2 C)-98.4 F (36.9 C)] 97.8 F (36.6 C) (10/12 0728) Pulse Rate:  [87-98] 95 (10/12 0728) Resp:  [16-20] 16 (10/12 0543) BP: (91-112)/(61-77) 100/70 (10/12 0728) SpO2:  [96 %-99 %] 96 % (10/12 0728) Weight:  [109.7 kg] 109.7 kg (10/12 0543) Last BM Date: 05/06/21  Weight change: Filed Weights   05/05/21 0618 05/06/21 0526 05/07/21 0543  Weight: 109.6 kg 109 kg 109.7 kg    Intake/Output:   Intake/Output Summary (Last 24 hours) at 05/07/2021 0751 Last data filed at 05/07/2021 0600 Gross per 24 hour  Intake 1203 ml  Output 1475 ml  Net -272 ml      Physical Exam    General: NAD Neck: No JVD, no thyromegaly or thyroid nodule.  Lungs: Clear to auscultation bilaterally with normal respiratory effort. CV: Nondisplaced PMI.  Heart regular S1/S2, no S3/S4, no murmur.  No peripheral edema.   Abdomen: Soft, nontender, no hepatosplenomegaly, no distention.  Skin: Intact without lesions or rashes.  Neurologic: Alert and oriented x 3.  Psych: Normal affect. Extremities: No clubbing or cyanosis. Left leg in boot.  HEENT: Normal.     Telemetry   NSR 90s with PVCs (personally reviewed)   Labs    CBC No results for input(s): WBC, NEUTROABS, HGB, HCT, MCV, PLT in the last 72 hours.  Basic Metabolic Panel Recent Labs    85/88/50 0350 05/06/21 0233  NA 135 136  K 4.1 4.1  CL 98 100  CO2 30 30  GLUCOSE 207* 202*  BUN 18 16  CREATININE 1.61* 1.70*  CALCIUM 8.5* 8.3*  MG  --  1.8   Liver Function Tests No results for input(s): AST, ALT, ALKPHOS, BILITOT, PROT, ALBUMIN in the last 72 hours. No results for input(s):  LIPASE, AMYLASE in the last 72 hours. Cardiac Enzymes No results for input(s): CKTOTAL, CKMB, CKMBINDEX, TROPONINI in the last 72 hours.  BNP: BNP (last 3 results) Recent Labs    03/23/21 0045 04/25/21 1505 04/28/21 1047  BNP 1,129.9* 1,691.7* 1,072.2*    ProBNP (last 3 results) Recent Labs    10/07/20 1004  PROBNP 468*     D-Dimer No results for input(s): DDIMER in the last 72 hours. Hemoglobin A1C No results for input(s): HGBA1C in the last 72 hours. Fasting Lipid Panel No results for input(s): CHOL, HDL, LDLCALC, TRIG, CHOLHDL, LDLDIRECT in the last 72 hours. Thyroid Function Tests No results for input(s): TSH, T4TOTAL, T3FREE, THYROIDAB in the last 72 hours.  Invalid input(s): FREET3  Other results:   Imaging    No results found.   Medications:     Scheduled Medications:  acetaminophen  1,000 mg Oral TID   aspirin EC  81 mg Oral Daily   atorvastatin  80 mg Oral q1800   docusate sodium  100 mg Oral BID   heparin  5,000 Units Subcutaneous Q8H   insulin aspart  0-15 Units Subcutaneous TID WC   metoprolol succinate  25 mg Oral QPM   mexiletine  200 mg Oral BID   potassium chloride  20 mEq Oral Daily  sacubitril-valsartan  1 tablet Oral BID   sodium chloride flush  3 mL Intravenous Q12H   spironolactone  25 mg Oral Daily   torsemide  40 mg Oral Daily    Infusions:  sodium chloride Stopped (04/30/21 1000)   sodium chloride 10 mL/hr at 05/03/21 1014   methocarbamol (ROBAXIN) IV      PRN Medications: sodium chloride, bisacodyl, HYDROmorphone (DILAUDID) injection, methocarbamol **OR** methocarbamol (ROBAXIN) IV, ondansetron **OR** ondansetron (ZOFRAN) IV, oxyCODONE, oxyCODONE, polyethylene glycol    Patient Profile   The patient is a 50 y/o male with a medical history of CAD s/p PCI/stent 2007 in [place]. 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  Assessment/Plan   1. Acute on chronic HFrEF:  With prior concern for low output HF.  Ischemic cardiomyopathy.  ? If frequent PVCs also contributing. Initial echo in 2/22 EF 40-45%.  Echo in 7/22 EF 30-35%.  Echo in 8/22 EF down to 25% with mild RV dysfunction.  Admitted in 08/22 with a/c CHF.  Co-ox off tunneled catheter (which was removed) was 54.8% on 03/24/21, suggesting at least borderline low output.  RHC 8/22 with preserved CO, mildly elevated PCWP, mild pulmonary hypertension.  Torsemide recently restarted in outpatient setting after he had not been taking at home. Had been taking an old supply of furosemide. He was admitted with volume overloaded, has diuresed well and weight down.  Volume status looks good today.  No BP room to increase meds. - Continue torsemide 40 mg daily.  - Continue spiro 25 mg daily  - Drop Toprol XL dose to 25 mg daily (take in the pm with soft BP).  - Continue Entresto 24/26 bid - Avoid SGLT2i with history of necrotizing fasciitis and poor hygiene. - Discharge with Lifevest due to severely reduced LV function and very frequent PVCs. Discussed with [employer] rep. 2. CAD: LHC in 2/22 with 50% dLM, 70% ramus, 70% pLCx, totally occluded proximal RCA, 90% stenosis ostial to mid LAD, 90% mid LAD, totally occluded distal LAD.  Cardiac MRI in 4/22 showed EF 40% with substantial viable myocardium, only delayed enhancement was subendocardial in the mid to apical inferior wall.  CABG had been planned in 8/22, but patient was admitted left foot diabetic foot infection that progressed to necrotizing fasciitis s/p debridement and IV abx.  Now in with left 5th metatarsal osteomyelitis and still has healing surgical wound left foot/lower leg. Ideally, would have CABG.  However, need to have further healing of the left lower leg.  Repeat coronary angiography (9/22) showed minimal change from prior study in 2/22. No chest pain.   - Has outpatient f/u with CT surgery later this month.  - Continue ASA 81 daily.  - Continue atorvastatin 80 mg daily.  3. 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   - Continue Toprol XL at 25 mg daily.  - Keep K > 4 and Mag > 2 - Now on mexiletine 200 mg BID - LifeVest as above 4. CKD, Stage 3a:  Scr had been in 2s with improvement to ~1.4 to 1.5, but now stable at 1.7-1.8.  No BMET today.  5. GI bleeding: History of erosive esophagitis earlier in 8/22.  Had been started on protonix during prior admit but no longer taking.  Hgb stable.  - Resume Protonix.  6. Left leg diabetic foot infection => necrotizing fasciitis in August 2022.  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 Completed cefepime and vancomycin on 10/7.  Prior surgical incision LLE healing. 7. DM2: Per primary team 8. SDOH:  -He has filled out paperwork for disability and medicaid. Difficulty paying for medications. -Mother helps with transportation to appts, has been given [facility] number for future appts. -TOC assisting with setting up PCP. -Lives in [place] so no Paramedicine. -TOC consult. - Plan to send HF meds to [facility] at discharge.  He should go home on torsemide 40 daily, ASA 81, atorvastatin 80, Entresto 24/26 bid, spironolactone 25 daily, Toprol XL 25 daily, mexiletine 200 bid, KCl 20 daily.   Followup CHF clinic arranged.   Should get LifeVest applied prior to discharge.     Length of Stay: 9  Marca Ancona, MD  05/07/2021, 7:51 AM  Advanced Heart Failure Team Pager [phone] (M-F; 7a - 5p)  Please contact [facility] for night-coverage after hours (5p -7a ) and weekends on [email or web address]

## 2021-05-07 NOTE — Progress Notes (Signed)
Inpatient Diabetes Program Recommendations  AACE/ADA: New Consensus Statement on Inpatient Glycemic Control   Target Ranges:  Prepandial:   less than 140 mg/dL      Peak postprandial:   less than 180 mg/dL (1-2 hours)      Critically ill patients:  140 - 180 mg/dL   Review of Glycemic Control Results for Adam James, Adam James (MRN [id number]) as of 05/07/2021 09:03  Ref. Range 05/06/2021 05:23 05/06/2021 11:31 05/06/2021 15:00 05/06/2021 17:08 05/07/2021 05:50  Glucose-Capillary Latest Ref Range: 70 - 99 mg/dL 993 (H) 716 (H) 967 (H) 235 (H) 217 (H)   Diabetes history: DM2 Outpatient Diabetes medications: Glyburide 5 mg BID, Metformin 500 mg BID Current orders for Inpatient glycemic control: Novolog 0-15 units TID with meals  Inpatient Diabetes Program Recommendations:    If pt is not d/c'd today consider: -  Novolog 0-5 units QHS for bedtime correction  -  Novolog 3 units TID with meals for meal coverage if patient eats at least 50% of meals.  Thanks, Christena Deem RN, MSN, BC-ADM Inpatient Diabetes Coordinator Team Pager [phone] (8a-5p)

## 2021-05-09 ENCOUNTER — Telehealth (HOSPITAL_COMMUNITY): Payer: Self-pay

## 2021-05-09 NOTE — Telephone Encounter (Signed)
Called patient however, no answerer  to confirm/remind patient of their appointment at the [facility] on 05/12/21.

## 2021-05-12 ENCOUNTER — Other Ambulatory Visit: Payer: Self-pay

## 2021-05-12 ENCOUNTER — Encounter: Payer: Self-pay | Admitting: Physician Assistant

## 2021-05-12 ENCOUNTER — Encounter (HOSPITAL_COMMUNITY): Payer: Medicaid Other

## 2021-05-12 ENCOUNTER — Ambulatory Visit: Payer: Self-pay | Attending: Physician Assistant | Admitting: Physician Assistant

## 2021-05-12 VITALS — BP 111/77 | HR 96 | Temp 98.7°F | Resp 18 | Ht 72.0 in | Wt 243.0 lb

## 2021-05-12 DIAGNOSIS — I5043 Acute on chronic combined systolic (congestive) and diastolic (congestive) heart failure: Secondary | ICD-10-CM

## 2021-05-12 DIAGNOSIS — E11622 Type 2 diabetes mellitus with other skin ulcer: Secondary | ICD-10-CM

## 2021-05-12 DIAGNOSIS — M86172 Other acute osteomyelitis, left ankle and foot: Secondary | ICD-10-CM

## 2021-05-12 MED ORDER — TRUE METRIX METER W/DEVICE KIT
1.0000 [IU] | PACK | Freq: Once | 0 refills | Status: AC
  Filled 2021-05-12: qty 1, 30d supply, fill #0

## 2021-05-12 MED ORDER — METFORMIN HCL 500 MG PO TABS
500.0000 mg | ORAL_TABLET | Freq: Two times a day (BID) | ORAL | 1 refills | Status: DC
  Filled 2021-05-12: qty 60, 30d supply, fill #0

## 2021-05-12 MED ORDER — TRUEPLUS LANCETS 28G MISC
1.0000 | Freq: Two times a day (BID) | 11 refills | Status: AC
  Filled 2021-05-12: qty 100, 50d supply, fill #0

## 2021-05-12 MED ORDER — GLYBURIDE 5 MG PO TABS
5.0000 mg | ORAL_TABLET | Freq: Two times a day (BID) | ORAL | 1 refills | Status: DC
  Filled 2021-05-12: qty 60, 30d supply, fill #0

## 2021-05-12 MED ORDER — TRUE METRIX BLOOD GLUCOSE TEST VI STRP
1.0000 | ORAL_STRIP | Freq: Two times a day (BID) | 12 refills | Status: AC
  Filled 2021-05-12: qty 100, 50d supply, fill #0

## 2021-05-12 MED ORDER — ACETAMINOPHEN-CODEINE #3 300-30 MG PO TABS
1.0000 | ORAL_TABLET | ORAL | 0 refills | Status: AC | PRN
Start: 2021-05-12 — End: 2021-05-17
  Filled 2021-05-12: qty 30, 3d supply, fill #0

## 2021-05-12 NOTE — Progress Notes (Signed)
Established Patient Office Visit  Subjective:  Patient ID: Artice Bergerson, male    DOB: 25-Oct-1970  Age: 50 y.o. MRN: [id number]  CC:  Chief Complaint  Patient presents with   Hospitalization Follow-up    HPI Yaakov Saindon states that he was hospitalized from October 3 through May 07, 2021.  Hospital course    Hospital Course by problem list: Tali Coster is a 50 y.o. male with a pertinent PMH of CAD, diabetes, HFrEF (25%, 2022) who is currently admitted for acute CHF exacerbation and diabetic foot wound complicated by fifth metatarsal osteomyelitis, now s/p ray amputation. LOS: 9 days   Patient was admitted for management of acute on chronic heart failure exacerbation, also presented with osteomyelitis of left fifth metatarsal.  Patient was diuresed with IV Lasix, however became hypotensive while still hypervolemic.  Heart failure team was consulted.  Home metoprolol 50 mg was decreased to 25 mg.  Restarted patient's spironolactone 12.5 mg, started metolazone 2.5 mg x2 days.  Ray amputation was on 10/5, had resultant foot drop.  No surgical complications.  Patient did refuse PT/OT, stating that he is clumsy on crutches.   Consults: Heart failure team, orthopedic   Plan: Acute on chronic HFrEf 2/2 Ischemic Cardiomyopathy  Obstructive CAD History NSTEMI August 2022 Currently below presumed dry weight, at 109 kg. - Heart Failure Team following  - Continue Torsemide 40 mg daily  - Continue home spironolactone 12.22m daily - Continue home metoprolol 25 mg daily - Continue Entresto BID - Strict I's and O's, monitor daily weights - Avoid SGLT2i with history of necrotizing fasciitis and poor hygiene.   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 Left foot drop 2/2 to surgery.  Was on IV vancomycin and cefepime for 3 days post procedure.  Had a wound VAC for 1 week post procedure.  Patient declined PT/O and  crutches. States that he will use his home walker.  - Wound VAC removed 10/10 - IV vancomycin and cefepime completed on 10/8 - Touchdown weightbearing in a cam boot, per ortho - We will have patient follow up with ortho within 1 week of discharge.   AKI  Hx of CKD  Stable.  - Continue to monitor renal function  - Strict in/out    High PVC Burden  Likely 2/2 to HFrEF, ischemic cardiomyopathy, obstructive CAD. Cardiology recommended fitting patient for a life vest. Will have patient follow-up with cardiology OP after discharge - Continue mexiletine started on 10/09 - Life Vest ordered  Type 2 diabetes - Sliding scale insulin (moderate)  Iron-Deficient Anemia - History of UGIB 2/2 erosive esophagitis in Aug 2022.     States today that he has been having pain in his foot, has not been taking anything for relief.  States that he has been keeping his wound clean and dry.  States that he has not made an appointment to follow-up with the surgeon yet.  Reports that he has been feeling overwhelmed with his current health and all of the office visits and paperwork that comes along with it. Does have the paperwork for CAFA, is working on filling it out.  Does not have a blood glucose monitor     Past Medical History:  Diagnosis Date   CAD (coronary artery disease)    Card[place]omy[place] ([place]    Diabetic foot ulcer ([place]    Dyslipidemia    Heart failure ([place]    [place]ocardial infarction ([place]  Type 2 diabetes mellitus ([place])     Past Surgical History:  Procedure Laterality Date   AMPUTATION Left 04/30/2021   Procedure: LEFT FOOT 5TH RAY AMPUTATION APPLICATION OF A-CELL POWDER;  Surgeon: Newt Minion, MD;  Location: [facility];  Service: Orthopedics;  Laterality: Left;   APPENDECTOMY     APPLICATION OF WOUND VAC Left 03/03/2021   Procedure: APPLICATION OF WOUND VAC;  Surgeon: Leandrew Koyanagi, MD;  Location: [facility];  Service: Orthopedics;  Laterality: Left;   APPLICATION OF WOUND VAC  03/07/2021    Procedure: APPLICATION OF WOUND VAC;  Surgeon: Newt Minion, MD;  Location: [facility];  Service: Orthopedics;;   CARDIAC CATHETERIZATION     COLON SURGERY     ESOPHAGOGASTRODUODENOSCOPY (EGD) WITH PROPOFOL N/A 03/12/2021   Procedure: ESOPHAGOGASTRODUODENOSCOPY (EGD) WITH PROPOFOL;  Surgeon: Daryel November, MD;  Location: [facility];  Service: Gastroenterology;  Laterality: N/A;   I & D EXTREMITY Left 02/24/2021   Procedure: IRRIGATION AND DEBRIDEMENT EXTREMITY;  Surgeon: Altamese [place], MD;  Location: [facility];  Service: Orthopedics;  Laterality: Left;   I & D EXTREMITY Left 02/26/2021   Procedure: IRRIGATION AND DEBRIDEMENT OF LEG;  Surgeon: Newt Minion, MD;  Location: [facility];  Service: Orthopedics;  Laterality: Left;   I & D EXTREMITY Left 03/03/2021   Procedure: LEFT LEG IRRIGATION AND DEBRIDEMENT;  Surgeon: Leandrew Koyanagi, MD;  Location: [facility];  Service: Orthopedics;  Laterality: Left;   I & D EXTREMITY Left 03/07/2021   Procedure: REPEAT DEBRIDEMENT LEFT LEG;  Surgeon: Newt Minion, MD;  Location: [facility];  Service: Orthopedics;  Laterality: Left;   IR FLUORO GUIDE CV LINE RIGHT  03/10/2021   IR REMOVAL TUN CV CATH W/O FL  03/24/2021   IR US GUIDE VASC ACCESS RIGHT  03/10/2021   LEFT HEART CATH AND CORONARY ANGIOGRAPHY N/A 09/13/2020   Procedure: LEFT HEART CATH AND CORONARY ANGIOGRAPHY;  Surgeon: Troy Sine, MD;  Location: [facility];  Service: Cardiovascular;  Laterality: N/A;   LEFT HEART CATH AND CORONARY ANGIOGRAPHY N/A 03/28/2021   Procedure: LEFT HEART CATH AND CORONARY ANGIOGRAPHY;  Surgeon: Larey Dresser, MD;  Location: [facility];  Service: Cardiovascular;  Laterality: N/A;   RIGHT HEART CATH N/A 03/26/2021   Procedure: RIGHT HEART CATH;  Surgeon: Larey Dresser, MD;  Location: [facility];  Service: Cardiovascular;  Laterality: N/A;   TOE AMPUTATION  2020    Family History  Problem Relation Age of Onset   Hypertension Mother    Heart failure Father     Hypertension Brother    Congestive Heart Failure Brother     Social History   Socioeconomic History   Marital status: Single    Spouse name: Not on file   Number of children: 1   Years of education: Not on file   Highest education level: 8th grade  Occupational History   Occupation: none    Comment: former "Photographer  Tobacco Use   Smoking status: Former    Packs/day: 3.00    Years: 36.00    Pack years: 108.00    Types: Cigarettes    Quit date: 08/27/2020    Years since quitting: 0.7   Smokeless tobacco: Never   Tobacco comments:    5-6 daily  Vaping Use   Vaping Use: Never used  Substance and Sexual Activity   Alcohol use: Not Currently    Comment: Very rare   Drug  use: Not Currently    Types: Marijuana   Sexual activity: Not on file  Other Topics Concern   Not on file  Social History Narrative   Not on file   Social Determinants of Health   Financial Resource Strain: High Risk   Difficulty of Paying Living Expenses: Very hard  Food Insecurity: No Food Insecurity   Worried About Running Out of Food in the Last Year: Never true   Ran Out of Food in the Last Year: Never true  Transportation Needs: Unmet Transportation Needs   Lack of Transportation (Medical): Yes   Lack of Transportation (Non-Medical): Yes  Physical Activity: Not on file  Stress: Not on file  Social Connections: Not on file  Intimate Partner Violence: Not on file    Outpatient Medications Prior to Visit  Medication Sig Dispense Refill   aspirin 81 MG EC tablet Take 1 tablet (81 mg total) by mouth daily. 30 tablet 0   atorvastatin (LIPITOR) 80 MG tablet Take 1 tablet (80 mg total) by mouth daily. 30 tablet 0   metoprolol succinate (TOPROL-XL) 25 MG 24 hr tablet Take 1 tablet (25 mg total) by mouth every evening. 30 tablet 0   mexiletine (MEXITIL) 200 MG capsule Take 1 capsule (200 mg total) by mouth 2 (two) times daily. 60 capsule 0   potassium chloride SA (KLOR-CON) 20 MEQ tablet Take 1  tablet (20 mEq total) by mouth daily. 30 tablet 0   sacubitril-valsartan (ENTRESTO) 24-26 MG Take 1 tablet by mouth 2 (two) times daily. 60 tablet 0   spironolactone (ALDACTONE) 25 MG tablet Take 1 tablet (25 mg total) by mouth daily. 30 tablet 0   torsemide (DEMADEX) 20 MG tablet Take 2 tablets (40 mg total) by mouth daily. 60 tablet 0   glyBURIDE (DIABETA) 5 MG tablet Take 5 mg by mouth 2 (two) times daily.     metFORMIN (GLUCOPHAGE) 500 MG tablet Take 500 mg by mouth 2 (two) times daily.     No facility-administered medications prior to visit.    Allergies  Allergen Reactions   Morphine Itching    ROS Review of Systems  Constitutional:  Negative for chills and fatigue.  HENT: Negative.    Eyes: Negative.   Respiratory:  Negative for shortness of breath.   Cardiovascular:  Negative for chest pain.  Gastrointestinal: Negative.   Endocrine: Negative.   Genitourinary: Negative.   Musculoskeletal:  Positive for gait problem.  Skin:  Positive for wound.  Allergic/Immunologic: Negative.   Neurological:  Negative for weakness and headaches.  Hematological: Negative.   Psychiatric/Behavioral: Negative.       Objective:    Physical Exam Vitals and nursing note reviewed.  Constitutional:      General: He is not in acute distress.    Appearance: Normal appearance. He is not ill-appearing.  HENT:     Head: Normocephalic and atraumatic.     Right Ear: External ear normal.     Left Ear: External ear normal.     Nose: Nose normal.     Mouth/Throat:     Mouth: Mucous membranes are moist.     Pharynx: Oropharynx is clear.  Eyes:     Extraocular Movements: Extraocular movements intact.     Conjunctiva/sclera: Conjunctivae normal.     Pupils: Pupils are equal, round, and reactive to light.  Cardiovascular:     Rate and Rhythm: Normal rate and regular rhythm.     Pulses: Normal pulses.     Heart sounds:  Normal heart sounds.  Pulmonary:     Effort: Pulmonary effort is normal.      Breath sounds: Normal breath sounds.  Musculoskeletal:        General: Normal range of motion.     Cervical back: Normal range of motion and neck supple.     Comments: In walking boot  Skin:    General: Skin is warm and dry.  Neurological:     General: No focal deficit present.     Mental Status: He is alert and oriented to person, place, and time.  Psychiatric:        Mood and Affect: Mood normal.        Behavior: Behavior normal.        Thought Content: Thought content normal.        Judgment: Judgment normal.    BP 111/77 (BP Location: Left Arm, Patient Position: Sitting, Cuff Size: Large)   Pulse 96   Temp 98.7 F (37.1 C) (Oral)   Resp 18   Ht 6' (1.829 m)   Wt 243 lb (110.2 kg)   SpO2 97%   BMI 32.96 kg/m  Wt Readings from Last 3 Encounters:  05/12/21 243 lb (110.2 kg)  05/07/21 241 lb 14.4 oz (109.7 kg)  04/25/21 269 lb 6.4 oz (122.2 kg)     Health Maintenance Due  Topic Date Due   COVID-19 Vaccine (1) Never done   FOOT EXAM  Never done   OPHTHALMOLOGY EXAM  Never done   Hepatitis C Screening  Never done   TETANUS/TDAP  Never done   COLONOSCOPY (Pts 45-25yr Insurance coverage will need to be confirmed)  Never done   INFLUENZA VACCINE  Never done    There are no preventive care reminders to display for this patient.  No results found for: TSH Lab Results  Component Value Date   WBC 10.1 05/12/2021   HGB 9.9 (L) 05/12/2021   HCT 30.9 (L) 05/12/2021   MCV 89 05/12/2021   PLT 272 05/12/2021   Lab Results  Component Value Date   NA 142 05/12/2021   K 4.8 05/12/2021   CO2 21 05/12/2021   GLUCOSE 134 (H) 05/12/2021   BUN 22 05/12/2021   CREATININE 1.51 (H) 05/12/2021   BILITOT 0.7 04/28/2021   ALKPHOS 72 04/28/2021   AST 12 (L) 04/28/2021   ALT 10 04/28/2021   PROT 6.2 (L) 04/28/2021   ALBUMIN 2.8 (L) 04/28/2021   CALCIUM 8.7 05/12/2021   ANIONGAP 8 05/07/2021   EGFR 57 (L) 05/12/2021   Lab Results  Component Value Date   CHOL 167  10/07/2020   Lab Results  Component Value Date   HDL 36 (L) 10/07/2020   Lab Results  Component Value Date   LDLCALC 108 (H) 10/07/2020   Lab Results  Component Value Date   TRIG 125 10/07/2020   Lab Results  Component Value Date   CHOLHDL 4.6 10/07/2020   Lab Results  Component Value Date   HGBA1C 6.1 (H) 04/28/2021      Assessment & Plan:   Problem List Items Addressed This Visit       Cardiovascular and Mediastinum   Acute on chronic combined systolic and diastolic CHF (congestive heart failure) (HCC)     Endocrine   Type 2 diabetes mellitus (HCC)   Relevant Medications   glyBURIDE (DIABETA) 5 MG tablet   metFORMIN (GLUCOPHAGE) 500 MG tablet   glucose blood (TRUE METRIX BLOOD GLUCOSE TEST) test strip   Blood  Glucose Monitoring Suppl (TRUE METRIX METER) w/Device KIT   TRUEplus Lancets 28G MISC   Other Relevant Orders   Magnesium (Completed)   Basic metabolic panel (Completed)   CBC with Differential/Platelet (Completed)     Musculoskeletal and Integument   Acute osteomyelitis of left foot (HCC) - Primary   Relevant Medications   acetaminophen-codeine (TYLENOL #3) 300-30 MG tablet    Meds ordered this encounter  Medications   glyBURIDE (DIABETA) 5 MG tablet    Sig: Take 1 tablet (5 mg total) by mouth 2 (two) times daily.    Dispense:  60 tablet    Refill:  1    Order Specific Question:   Supervising Provider    Answer:   Asencion Noble E [[id number]]   metFORMIN (GLUCOPHAGE) 500 MG tablet    Sig: Take 1 tablet (500 mg total) by mouth 2 (two) times daily.    Dispense:  60 tablet    Refill:  1    Order Specific Question:   Supervising Provider    Answer:   Asencion Noble E [[id number]]   acetaminophen-codeine (TYLENOL #3) 300-30 MG tablet    Sig: Take 1-2 tablets by mouth every 4 (four) hours as needed for up to 5 days for moderate pain.    Dispense:  30 tablet    Refill:  0    Order Specific Question:   Supervising Provider    Answer:   Joya Gaskins, PATRICK E  [[id number]]   glucose blood (TRUE METRIX BLOOD GLUCOSE TEST) test strip    Sig: Use as instructed twice daily    Dispense:  100 each    Refill:  12    Order Specific Question:   Supervising Provider    Answer:   Asencion Noble E [[id number]]   Blood Glucose Monitoring Suppl (TRUE METRIX METER) w/Device KIT    Sig: Use as directed    Dispense:  1 kit    Refill:  0    Order Specific Question:   Supervising Provider    Answer:   Joya Gaskins, PATRICK E [[id number]]   TRUEplus Lancets 28G MISC    Sig: Use as directed twice daily at 8 am and 10 pm.    Dispense:  100 each    Refill:  11    Order Specific Question:   Supervising Provider    Answer:   Asencion Noble E [[id number]]  1. Acute osteomyelitis of left foot [facility]) Patient encouraged to make prompt follow-up with surgeon, patient understands and agrees.  Trial Tylenol 3 for severe pain, check of [place] controlled substance registry appropriate.  Patient will be given appointment to [facility].  Patient encouraged to continue application for [place] financial assistance.  Red flags given for prompt reevaluation  - acetaminophen-codeine (TYLENOL #3) 300-30 MG tablet; Take 1-2 tablets by mouth every 4 (four) hours as needed for up to 5 days for moderate pain.  Dispense: 30 tablet; Refill: 0  2. Acute on chronic combined systolic and diastolic CHF (congestive heart failure) ([place]) Continue follow-up with cardiology as scheduled  3. Type 2 diabetes mellitus with other skin ulcer, without long-term current use of insulin ([place]) Patient encouraged to check blood glucose levels at home on a daily basis, keep a written log and have available for all office visits.  Red flags given for prompt reevaluation - Magnesium - Basic metabolic panel - CBC with Differential/Platelet - glyBURIDE (DIABETA) 5 MG tablet; Take 1 tablet (  5 mg total) by mouth 2 (two) times daily.  Dispense: 60 tablet;  Refill: 1 - metFORMIN (GLUCOPHAGE) 500 MG tablet; Take 1 tablet (500 mg total) by mouth 2 (two) times daily.  Dispense: 60 tablet; Refill: 1 - glucose blood (TRUE METRIX BLOOD GLUCOSE TEST) test strip; Use as instructed twice daily  Dispense: 100 each; Refill: 12 - Blood Glucose Monitoring Suppl (TRUE METRIX METER) w/Device KIT; Use as directed  Dispense: 1 kit; Refill: 0 - TRUEplus Lancets 28G MISC; Use as directed twice daily at 8 am and 10 pm.  Dispense: 100 each; Refill: 11   I have reviewed the patient's medical history (PMH, PSH, Social History, Family History, Medications, and allergies) , and have been updated if relevant. I spent 30 minutes reviewing chart and  face to face time with patient.   Follow-up: Return for To establish PCP, At [facility].    Loraine Grip Mayers, PA-C

## 2021-05-12 NOTE — Patient Instructions (Signed)
To help with your pain, you can use Tylenol 3.  I strongly encourage you to schedule your follow-up appointment with your surgeon as soon as possible.  We will call you with today's lab results, please let us know if there is anything else we can do for you.  Roney Jaffe, PA-C Physician Assistant [facility] [email or web address]   Acetaminophen; Codeine Tablets What is this medication? ACETAMINOPHEN; CODEINE (a set a MEE noe fen; KOE deen) treats moderate pain. It is prescribed when other pain medications have not worked or cannot be tolerated. It works by blocking pain signals in the brain. This medication is a combination of acetaminophen and an opioid. This medicine may be used for other purposes; ask your health care provider or pharmacist if you have questions. COMMON BRAND NAME(S): Cocet, Cocet Plus, Tylenol with Codeine No.3, Tylenol with Codeine No.4, Vopac What should I tell my care team before I take this medication? They need to know if you have any of these conditions: Brain tumor Drug abuse or addiction Head injury Heart disease If you often drink alcohol Kidney disease Liver disease Low adrenal gland function Lung disease, asthma, or breathing problems Seizures Stomach or intestine problems Taken an MAOI like Marplan, Nardil, or Parnate in the last 14 days An unusual or allergic reaction to acetaminophen, codeine, other medications, foods, dyes, or preservatives Pregnant or trying to get pregnant Breast-feeding How should I use this medication? Take this medication by mouth with a full glass of water. Follow the directions on the prescription label. You can take it with or without food. If it upsets your stomach, take the medication with food. Do not take your medication more often than directed. A special MedGuide will be given to you by the pharmacist with each prescription and refill. Be sure to read this  information carefully each time. Talk to your care team regarding the use of this medication in children. This medication is not for use in children less than 28 years of age. Do not give this medication to a child younger than 19 years of age after surgery to remove the tonsils and/or adenoids. Overdosage: If you think you have taken too much of this medicine contact a poison control center or emergency room at once. NOTE: This medicine is only for you. Do not share this medicine with others. What if I miss a dose? If you miss a dose, take it as soon as you can. If it is almost time for your next dose, take only that dose. Do not take double or extra doses. What may interact with this medication? Do not take this medication with any of the following: Linezolid MAOIs like Marplan, Nardil, and Parnate Methylene blue Ozanimod Samidorphan This medication may also interact with the following: Alcohol Amiodarone Antihistamines for allergy, cough, and cold Atropine Certain antibiotics like clarithromycin, erythromycin, rifampin Certain antivirals for HIV or hepatitis Certain medications for anxiety or sleep Certain medications for bladder problems like oxybutynin, tolterodine Certain medications for depression like amitriptyline, bupropion, fluoxetine, paroxetine, sertraline, mirtazapine, trazodone Certain medications for fungal infections like ketoconazole, itraconazole, and posaconazole Certain medications for migraine headache like almotriptan, eletriptan, frovatriptan, naratriptan, rizatriptan, sumatriptan, zolmitriptan Certain medications for nausea or vomiting like dolasetron, granisetron, ondansetron, palonosetron Certain medications for Parkinson's disease like benztropine, trihexyphenidyl Certain medications for seizures like carbamazepine, phenobarbital, phenytoin, primidone Certain medications for stomach problems like dicyclomine, hyoscyamine Certain medications for travel sickness  like scopolamine Diuretics General anesthetics like halothane, isoflurane, methoxyflurane, propofol Ipratropium  Medications that relax muscles Other medications with acetaminophen Other narcotic medications for pain or cough Phenothiazines like chlorpromazine, mesoridazine, prochlorperazine, thioridazine Quinidine This list may not describe all possible interactions. Give your health care provider a list of all the medicines, herbs, non-prescription drugs, or dietary supplements you use. Also tell them if you smoke, drink alcohol, or use illegal drugs. Some items may interact with your medicine. What should I watch for while using this medication? Tell your care team if your pain does not go away, if it gets worse, or if you have new or a different type of pain. You may develop tolerance to this medication. Tolerance means that you will need a higher dose of the medication for pain relief. Tolerance is normal and is expected if you take this medication for a long time. There are different types of narcotic medications (opioids) for pain. If you take more than one type at the same time, you may have more side effects. Give your care team a list of all medications you use. They will tell you how much medication to take. Do not take more medication than directed. Call emergency services if you have problems breathing. Do not suddenly stop taking your medication because you may develop a severe reaction. Your body becomes used to the medication. This does NOT mean you are addicted. Addiction is a behavior related to getting and using a medication for a nonmedical reason. If you have pain, you have a medical reason to take pain medication. Your care team will tell you how much medication to take. If your care team wants you to stop the medication, the dose will be slowly lowered over time to avoid any side effects. Talk to your care team about naloxone and how to get it. Naloxone is an emergency medication  used for an opioid overdose. An overdose can happen if you take too much opioid. It can also happen if an opioid is taken with some other medications or substances, like alcohol. Know the symptoms of an overdose, like trouble breathing, unusually tired or sleepy, or not being able to respond or wake up. Make sure to tell caregivers and close contacts where it is stored. Make sure they know how to use it. After naloxone is given, you must call emergency services. Naloxone is a temporary treatment. Repeat doses may be needed. Children may be at higher risk for side effects. If your child has slow breathing, noisy breathing, confusion, or unusual sleepiness, stop giving this medication and get emergency help right away. Do not take other medications that contain acetaminophen with this medication. Many non-prescription medications contain acetaminophen. Always read labels carefully. If you have questions, ask your care team. If you take too much acetaminophen, get medical help right away. Too much acetaminophen can be very dangerous and cause liver damage. Even if you do not have symptoms, it is important to get help right away. You may get drowsy or dizzy. Do not drive, use machinery, or do anything that needs mental alertness until you know how this medication affects you. Do not stand up or sit up quickly, especially if you are an older patient. This reduces the risk of dizzy or fainting spells. Alcohol may interfere with the effect of this medication. Avoid alcoholic drinks. This medication will cause constipation. If you do not have a bowel movement for 3 days, call your care team. Your mouth may get dry. Chewing sugarless gum or sucking hard candy and drinking plenty of water may help.  Contact your care team if the problem does not go away or is severe. What side effects may I notice from receiving this medication? Side effects that you should report to your care team as soon as possible: Allergic  reactions-skin rash, itching, hives, swelling of the face, lips, tongue, or throat CNS depression-slow or shallow breathing, shortness of breath, feeling faint, dizziness, confusion, difficulty staying awake Liver injury-right upper belly pain, loss of appetite, nausea, light-colored stool, dark yellow or brown urine, yellowing skin or eyes, unusual weakness or fatigue Low adrenal gland function-nausea, vomiting, loss of appetite, unusual weakness or fatigue, dizziness Low blood pressure-dizziness, feeling faint or lightheaded, blurry vision Redness, blistering, peeling, or loosening of the skin, including inside the mouth Side effects that usually do not require medical attention (report to your care team if they continue or are bothersome): Constipation Dizziness Drowsiness Dry mouth Headache Nausea Trouble sleeping Upset stomach Vomiting This list may not describe all possible side effects. Call your doctor for medical advice about side effects. You may report side effects to FDA at 1-800-FDA-1088. Where should I keep my medication? Keep out of the reach of children and pets. This medication can be abused. Keep your medication in a safe place to protect it from theft. Do not share this medication with anyone. Selling or giving away this medication is dangerous and against the law. Store at room temperature between 15 and 30 degrees C (59 and 86 degrees F). This medication may cause accidental overdose and death if taken by other adults, children, or pets. Mix any unused medication with a substance like cat litter or coffee grounds. Then throw the medication away in a sealed container like a sealed bag or a coffee can with a lid. Do not use the medication after the expiration date. NOTE: This sheet is a summary. It may not cover all possible information. If you have questions about this medicine, talk to your doctor, pharmacist, or health care provider.  2022 Elsevier/Gold Standard  (2020-07-25 12:46:02)

## 2021-05-12 NOTE — Progress Notes (Signed)
Patient has had coffee with creamer. Patient has taken medication today. Last taken DM medication last night and needs refills. Patient reports throbbing and aching in the foot that experienced surgery.

## 2021-05-13 ENCOUNTER — Encounter: Payer: Self-pay | Admitting: Physician Assistant

## 2021-05-13 LAB — CBC WITH DIFFERENTIAL/PLATELET
Basophils Absolute: 0.1 10*3/uL (ref 0.0–0.2)
Basos: 1 %
EOS (ABSOLUTE): 0.2 10*3/uL (ref 0.0–0.4)
Eos: 2 %
Hematocrit: 30.9 % — ABNORMAL LOW (ref 37.5–51.0)
Hemoglobin: 9.9 g/dL — ABNORMAL LOW (ref 13.0–17.7)
Immature Grans (Abs): 0 10*3/uL (ref 0.0–0.1)
Immature Granulocytes: 0 %
Lymphocytes Absolute: 4.2 10*3/uL — ABNORMAL HIGH (ref 0.7–3.1)
Lymphs: 41 %
MCH: 28.6 pg (ref 26.6–33.0)
MCHC: 32 g/dL (ref 31.5–35.7)
MCV: 89 fL (ref 79–97)
Monocytes Absolute: 0.6 10*3/uL (ref 0.1–0.9)
Monocytes: 6 %
Neutrophils Absolute: 5.1 10*3/uL (ref 1.4–7.0)
Neutrophils: 50 %
Platelets: 272 10*3/uL (ref 150–450)
RBC: 3.46 x10E6/uL — ABNORMAL LOW (ref 4.14–5.80)
RDW: 14.1 % (ref 11.6–15.4)
WBC: 10.1 10*3/uL (ref 3.4–10.8)

## 2021-05-13 LAB — BASIC METABOLIC PANEL
BUN/Creatinine Ratio: 15 (ref 9–20)
BUN: 22 mg/dL (ref 6–24)
CO2: 21 mmol/L (ref 20–29)
Calcium: 8.7 mg/dL (ref 8.7–10.2)
Chloride: 106 mmol/L (ref 96–106)
Creatinine, Ser: 1.51 mg/dL — ABNORMAL HIGH (ref 0.76–1.27)
Glucose: 134 mg/dL — ABNORMAL HIGH (ref 70–99)
Potassium: 4.8 mmol/L (ref 3.5–5.2)
Sodium: 142 mmol/L (ref 134–144)
eGFR: 57 mL/min/{1.73_m2} — ABNORMAL LOW (ref >59)

## 2021-05-13 LAB — MAGNESIUM: Magnesium: 1.5 mg/dL — ABNORMAL LOW (ref 1.6–2.3)

## 2021-05-14 ENCOUNTER — Telehealth: Payer: Self-pay | Admitting: Licensed Clinical Social Worker

## 2021-05-14 NOTE — H&P (View-Only) (Signed)
[address]       [place] [address]             [phone]        [name] [facility] Medical Record #[id number] Date of Birth: 07-04-1971  Referring: Jacklynn Ganong, FNP Primary Care: Pcp, No Primary Cardiologist:Brian Dulce Sellar, MD  Chief Complaint:    Chief Complaint  Patient presents with   Coronary Artery Disease    Surgical consult, Cardiac Cath 03/28/21, 03/26/21, ECHO 03/24/21    History of Present Illness:     Is a 50 year old gentleman that presents in follow-up.  He was originally seen for coronary artery bypass grafting in the setting of congestive heart failure.  His surgery was postponed due to pretty significant lower extremity wound infection.  This has been healing well.  He now states that he is ready to undergo open heart surgery.  He denies any shortness of breath or anginal symptoms.  He did undergo a cardiac MRI previously which showed good viability.  He also underwent a another left heart cath last month which redemonstrated previous disease.    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 APPLICATION OF A-CELL POWDER;  Surgeon: [name], MD;  Location: [facility] OR;  Service: Orthopedics;  Laterality: Left;   APPENDECTOMY     APPLICATION OF WOUND VAC Left 03/03/2021   Procedure: APPLICATION OF WOUND VAC;  Surgeon: Tarry Kos, MD;  Location: [facility] OR;  Service: Orthopedics;  Laterality: Left;   APPLICATION OF WOUND VAC  03/07/2021   Procedure: APPLICATION OF WOUND VAC;  Surgeon: [name], MD;  Location: [facility] OR;  Service: Orthopedics;;   CARDIAC CATHETERIZATION     COLON SURGERY     ESOPHAGOGASTRODUODENOSCOPY (EGD)  WITH PROPOFOL N/A 03/12/2021   Procedure: ESOPHAGOGASTRODUODENOSCOPY (EGD) WITH PROPOFOL;  Surgeon: Jenel Lucks, MD;  Location: [facility] ENDOSCOPY;  Service: Gastroenterology;  Laterality: N/A;   I & D EXTREMITY Left 02/24/2021   Procedure: IRRIGATION AND DEBRIDEMENT EXTREMITY;  Surgeon: Myrene Galas, MD;  Location: [facility] OR;  Service: Orthopedics;  Laterality: Left;   I & D EXTREMITY Left 02/26/2021   Procedure: IRRIGATION AND DEBRIDEMENT OF LEG;  Surgeon: [name], MD;  Location: [facility] OR;  Service: Orthopedics;  Laterality: Left;   I & D EXTREMITY Left 03/03/2021   Procedure: LEFT LEG IRRIGATION AND DEBRIDEMENT;  Surgeon: Tarry Kos, MD;  Location: [facility] OR;  Service: Orthopedics;  Laterality: Left;   I & D EXTREMITY Left 03/07/2021   Procedure: REPEAT DEBRIDEMENT LEFT LEG;  Surgeon: [name], MD;  Location: [facility] OR;  Service: Orthopedics;  Laterality: Left;   IR FLUORO GUIDE CV LINE RIGHT  03/10/2021   IR REMOVAL TUN CV CATH W/O FL  03/24/2021   IR US GUIDE VASC ACCESS RIGHT  03/10/2021   LEFT HEART CATH AND CORONARY ANGIOGRAPHY N/A 09/13/2020   Procedure: LEFT HEART CATH AND CORONARY ANGIOGRAPHY;  Surgeon: Lennette Bihari, MD;  Location: [facility] INVASIVE CV LAB;  Service: Cardiovascular;  Laterality: N/A;   LEFT HEART CATH AND CORONARY ANGIOGRAPHY N/A  03/28/2021   Procedure: LEFT HEART CATH AND CORONARY ANGIOGRAPHY;  Surgeon: Laurey Morale, MD;  Location: [facility] INVASIVE CV LAB;  Service: Cardiovascular;  Laterality: N/A;   RIGHT HEART CATH N/A 03/26/2021   Procedure: RIGHT HEART CATH;  Surgeon: Laurey Morale, MD;  Location: [facility] INVASIVE CV LAB;  Service: Cardiovascular;  Laterality: N/A;   TOE AMPUTATION  2020    Social History: Support: Lives with his daughter.  Social History   Tobacco Use  Smoking Status Former   Packs/day: 3.00   Years: 36.00   Pack years: 108.00   Types: Cigarettes   Quit date: 08/27/2020   Years since quitting: 0.7  Smokeless Tobacco Never  Tobacco Comments   5-6  daily    Social History   Substance and Sexual Activity  Alcohol Use Not Currently   Comment: Very rare     Allergies  Allergen Reactions   Morphine Itching      Current Outpatient Medications  Medication Sig Dispense Refill   acetaminophen-codeine (TYLENOL #3) 300-30 MG tablet Take 1-2 tablets by mouth every 4 (four) hours as needed for up to 5 days for moderate pain. 30 tablet 0   aspirin 81 MG EC tablet Take 1 tablet (81 mg total) by mouth daily. 30 tablet 0   atorvastatin (LIPITOR) 80 MG tablet Take 1 tablet (80 mg total) by mouth daily. 30 tablet 0   glucose blood (TRUE METRIX BLOOD GLUCOSE TEST) test strip Use as instructed twice daily 100 each 12   glyBURIDE (DIABETA) 5 MG tablet Take 1 tablet (5 mg total) by mouth 2 (two) times daily. 60 tablet 1   metFORMIN (GLUCOPHAGE) 500 MG tablet Take 1 tablet (500 mg total) by mouth 2 (two) times daily. 60 tablet 1   metoprolol succinate (TOPROL-XL) 25 MG 24 hr tablet Take 1 tablet (25 mg total) by mouth every evening. 30 tablet 0   mexiletine (MEXITIL) 200 MG capsule Take 1 capsule (200 mg total) by mouth 2 (two) times daily. 60 capsule 0   potassium chloride SA (KLOR-CON) 20 MEQ tablet Take 1 tablet (20 mEq total) by mouth daily. 30 tablet 0   sacubitril-valsartan (ENTRESTO) 24-26 MG Take 1 tablet by mouth 2 (two) times daily. 60 tablet 0   spironolactone (ALDACTONE) 25 MG tablet Take 1 tablet (25 mg total) by mouth daily. 30 tablet 0   torsemide (DEMADEX) 20 MG tablet Take 2 tablets (40 mg total) by mouth daily. 60 tablet 0   TRUEplus Lancets 28G MISC Use as directed twice daily at 8 am and 10 pm. 100 each 11   No current facility-administered medications for this visit.    (Not in a hospital admission)   Family History  Problem Relation Age of Onset   Hypertension Mother    Heart failure Father    Hypertension Brother    Congestive Heart Failure Brother      Review of Systems:   Review of Systems  Constitutional:   Positive for malaise/fatigue.  Respiratory:  Positive for shortness of breath.   Cardiovascular:  Positive for leg swelling. Negative for chest pain.     Physical Exam: BP 100/73   Pulse 96   Resp 20   Ht 6' (1.829 m)   Wt 243 lb (110.2 kg)   SpO2 97% Comment: RA  BMI 32.96 kg/m  Physical Exam Constitutional:      General: He is not in acute distress.    Appearance: He is ill-appearing.  HENT:  Head: Normocephalic and atraumatic.  Cardiovascular:     Rate and Rhythm: Normal rate.  Pulmonary:     Effort: Pulmonary effort is normal. No respiratory distress.  Abdominal:     General: Abdomen is flat. There is no distension.  Musculoskeletal:        General: Normal range of motion.     Cervical back: Normal range of motion.  Skin:    General: Skin is warm.  Neurological:     General: No focal deficit present.     Mental Status: He is alert and oriented to person, place, and time.      Diagnostic Studies & Laboratory data:     I have independently reviewed the above radiologic studies and discussed with the patient   Recent Lab Findings: Lab Results  Component Value Date   WBC 10.1 05/12/2021   HGB 9.9 (L) 05/12/2021   HCT 30.9 (L) 05/12/2021   PLT 272 05/12/2021   GLUCOSE 134 (H) 05/12/2021   CHOL 167 10/07/2020   TRIG 125 10/07/2020   HDL 36 (L) 10/07/2020   LDLCALC 108 (H) 10/07/2020   ALT 10 04/28/2021   AST 12 (L) 04/28/2021   NA 142 05/12/2021   K 4.8 05/12/2021   CL 106 05/12/2021   CREATININE 1.51 (H) 05/12/2021   BUN 22 05/12/2021   CO2 21 05/12/2021   INR 1.3 (H) 02/21/2021   HGBA1C 6.1 (H) 04/28/2021      Assessment / Plan:   50 year old male with severe three-vessel coronary artery disease, and congestive heart failure with an EF of 25%, and mildly reduced right ventricular heart function.  There is no significant valvular disease.  On review of his left heart cath, he does not have a good target in his LAD.  All of his other vessels are  somewhat small but would be amenable for surgical revascularization.  He was previously seen back and February of this year with mostly heart failure symptoms.  He did undergo a cardiac MRI and April which did show good viability.  He has been admitted to the hospital since then for management of heart failure exacerbation, and he also has a large wound on his foot that was being treated.  I previously had plan to perform surgical revascularization to his ramus branch with LIMA, and vein to the PDA, LAD, and obtuse marginal branches.  Given his age we will consider using a radial artery.  We will not be able to use the left leg for vein harvesting.  He is agreeable to proceed and scheduled for the next week.     I  spent 20 minutes counseling the patient face to face.   Corliss Skains 05/16/2021 5:02 PM

## 2021-05-14 NOTE — Telephone Encounter (Signed)
LCSW attempted to f/u with pt as he has been given CAFA form multiple times yet by this writer, inpatient [facility] team and financial counseling and has not yet completed formal f/u with [facility] clinic. No answer at [phone], rang busy with no option to leave voicemail.   Adam James, MSW, LCSW [facility] Heart/Vascular Care Navigation  [phone]

## 2021-05-14 NOTE — Progress Notes (Signed)
[address]       [place] [address]             [phone]        [facility] Medical Record #[id number] Date of Birth: 07-04-1971  Referring: Jacklynn Ganong, FNP Primary Care: Pcp, No Primary Cardiologist:Brian Dulce Sellar, MD  Chief Complaint:    Chief Complaint  Patient presents with   Coronary Artery Disease    Surgical consult, Cardiac Cath 03/28/21, 03/26/21, ECHO 03/24/21    History of Present Illness:     Is a 50 year old gentleman that presents in follow-up.  He was originally seen for coronary artery bypass grafting in the setting of congestive heart failure.  His surgery was postponed due to pretty significant lower extremity wound infection.  This has been healing well.  He now states that he is ready to undergo open heart surgery.  He denies any shortness of breath or anginal symptoms.  He did undergo a cardiac MRI previously which showed good viability.  He also underwent a another left heart cath last month which redemonstrated previous disease.    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 APPLICATION OF A-CELL POWDER;  Surgeon: [name], MD;  Location: [facility] OR;  Service: Orthopedics;  Laterality: Left;   APPENDECTOMY     APPLICATION OF WOUND VAC Left 03/03/2021   Procedure: APPLICATION OF WOUND VAC;  Surgeon: Tarry Kos, MD;  Location: [facility] OR;  Service: Orthopedics;  Laterality: Left;   APPLICATION OF WOUND VAC  03/07/2021   Procedure: APPLICATION OF WOUND VAC;  Surgeon: [name], MD;  Location: [facility] OR;  Service: Orthopedics;;   CARDIAC CATHETERIZATION     COLON SURGERY     ESOPHAGOGASTRODUODENOSCOPY (EGD)  WITH PROPOFOL N/A 03/12/2021   Procedure: ESOPHAGOGASTRODUODENOSCOPY (EGD) WITH PROPOFOL;  Surgeon: Jenel Lucks, MD;  Location: [facility] ENDOSCOPY;  Service: Gastroenterology;  Laterality: N/A;   I & D EXTREMITY Left 02/24/2021   Procedure: IRRIGATION AND DEBRIDEMENT EXTREMITY;  Surgeon: Myrene Galas, MD;  Location: [facility] OR;  Service: Orthopedics;  Laterality: Left;   I & D EXTREMITY Left 02/26/2021   Procedure: IRRIGATION AND DEBRIDEMENT OF LEG;  Surgeon: [name], MD;  Location: [facility] OR;  Service: Orthopedics;  Laterality: Left;   I & D EXTREMITY Left 03/03/2021   Procedure: LEFT LEG IRRIGATION AND DEBRIDEMENT;  Surgeon: Tarry Kos, MD;  Location: [facility] OR;  Service: Orthopedics;  Laterality: Left;   I & D EXTREMITY Left 03/07/2021   Procedure: REPEAT DEBRIDEMENT LEFT LEG;  Surgeon: [name], MD;  Location: [facility] OR;  Service: Orthopedics;  Laterality: Left;   IR FLUORO GUIDE CV LINE RIGHT  03/10/2021   IR REMOVAL TUN CV CATH W/O FL  03/24/2021   IR US GUIDE VASC ACCESS RIGHT  03/10/2021   LEFT HEART CATH AND CORONARY ANGIOGRAPHY N/A 09/13/2020   Procedure: LEFT HEART CATH AND CORONARY ANGIOGRAPHY;  Surgeon: Lennette Bihari, MD;  Location: [facility] INVASIVE CV LAB;  Service: Cardiovascular;  Laterality: N/A;   LEFT HEART CATH AND CORONARY ANGIOGRAPHY N/A  03/28/2021   Procedure: LEFT HEART CATH AND CORONARY ANGIOGRAPHY;  Surgeon: McLean, Dalton S, MD;  Location: [facility] INVASIVE CV LAB;  Service: Cardiovascular;  Laterality: N/A;   RIGHT HEART CATH N/A 03/26/2021   Procedure: RIGHT HEART CATH;  Surgeon: McLean, Dalton S, MD;  Location: [facility] INVASIVE CV LAB;  Service: Cardiovascular;  Laterality: N/A;   TOE AMPUTATION  2020    Social History: Support: Lives with his daughter.  Social History   Tobacco Use  Smoking Status Former   Packs/day: 3.00   Years: 36.00   Pack years: 108.00   Types: Cigarettes   Quit date: 08/27/2020   Years since quitting: 0.7  Smokeless Tobacco Never  Tobacco Comments   5-6  daily    Social History   Substance and Sexual Activity  Alcohol Use Not Currently   Comment: Very rare     Allergies  Allergen Reactions   Morphine Itching      Current Outpatient Medications  Medication Sig Dispense Refill   acetaminophen-codeine (TYLENOL #3) 300-30 MG tablet Take 1-2 tablets by mouth every 4 (four) hours as needed for up to 5 days for moderate pain. 30 tablet 0   aspirin 81 MG EC tablet Take 1 tablet (81 mg total) by mouth daily. 30 tablet 0   atorvastatin (LIPITOR) 80 MG tablet Take 1 tablet (80 mg total) by mouth daily. 30 tablet 0   glucose blood (TRUE METRIX BLOOD GLUCOSE TEST) test strip Use as instructed twice daily 100 each 12   glyBURIDE (DIABETA) 5 MG tablet Take 1 tablet (5 mg total) by mouth 2 (two) times daily. 60 tablet 1   metFORMIN (GLUCOPHAGE) 500 MG tablet Take 1 tablet (500 mg total) by mouth 2 (two) times daily. 60 tablet 1   metoprolol succinate (TOPROL-XL) 25 MG 24 hr tablet Take 1 tablet (25 mg total) by mouth every evening. 30 tablet 0   mexiletine (MEXITIL) 200 MG capsule Take 1 capsule (200 mg total) by mouth 2 (two) times daily. 60 capsule 0   potassium chloride SA (KLOR-CON) 20 MEQ tablet Take 1 tablet (20 mEq total) by mouth daily. 30 tablet 0   sacubitril-valsartan (ENTRESTO) 24-26 MG Take 1 tablet by mouth 2 (two) times daily. 60 tablet 0   spironolactone (ALDACTONE) 25 MG tablet Take 1 tablet (25 mg total) by mouth daily. 30 tablet 0   torsemide (DEMADEX) 20 MG tablet Take 2 tablets (40 mg total) by mouth daily. 60 tablet 0   TRUEplus Lancets 28G MISC Use as directed twice daily at 8 am and 10 pm. 100 each 11   No current facility-administered medications for this visit.    (Not in a hospital admission)   Family History  Problem Relation Age of Onset   Hypertension Mother    Heart failure Father    Hypertension Brother    Congestive Heart Failure Brother      Review of Systems:   Review of Systems  Constitutional:   Positive for malaise/fatigue.  Respiratory:  Positive for shortness of breath.   Cardiovascular:  Positive for leg swelling. Negative for chest pain.     Physical Exam: BP 100/73   Pulse 96   Resp 20   Ht 6' (1.829 m)   Wt 243 lb (110.2 kg)   SpO2 97% Comment: RA  BMI 32.96 kg/m  Physical Exam Constitutional:      General: He is not in acute distress.    Appearance: He is ill-appearing.  HENT:       Head: Normocephalic and atraumatic.  Cardiovascular:     Rate and Rhythm: Normal rate.  Pulmonary:     Effort: Pulmonary effort is normal. No respiratory distress.  Abdominal:     General: Abdomen is flat. There is no distension.  Musculoskeletal:        General: Normal range of motion.     Cervical back: Normal range of motion.  Skin:    General: Skin is warm.  Neurological:     General: No focal deficit present.     Mental Status: He is alert and oriented to person, place, and time.      Diagnostic Studies & Laboratory data:     I have independently reviewed the above radiologic studies and discussed with the patient   Recent Lab Findings: Lab Results  Component Value Date   WBC 10.1 05/12/2021   HGB 9.9 (L) 05/12/2021   HCT 30.9 (L) 05/12/2021   PLT 272 05/12/2021   GLUCOSE 134 (H) 05/12/2021   CHOL 167 10/07/2020   TRIG 125 10/07/2020   HDL 36 (L) 10/07/2020   LDLCALC 108 (H) 10/07/2020   ALT 10 04/28/2021   AST 12 (L) 04/28/2021   NA 142 05/12/2021   K 4.8 05/12/2021   CL 106 05/12/2021   CREATININE 1.51 (H) 05/12/2021   BUN 22 05/12/2021   CO2 21 05/12/2021   INR 1.3 (H) 02/21/2021   HGBA1C 6.1 (H) 04/28/2021      Assessment / Plan:   49-year-old male with severe three-vessel coronary artery disease, and congestive heart failure with an EF of 25%, and mildly reduced right ventricular heart function.  There is no significant valvular disease.  On review of his left heart cath, he does not have a good target in his LAD.  All of his other vessels are  somewhat small but would be amenable for surgical revascularization.  He was previously seen back and February of this year with mostly heart failure symptoms.  He did undergo a cardiac MRI and April which did show good viability.  He has been admitted to the hospital since then for management of heart failure exacerbation, and he also has a large wound on his foot that was being treated.  I previously had plan to perform surgical revascularization to his ramus branch with LIMA, and vein to the PDA, LAD, and obtuse marginal branches.  Given his age we will consider using a radial artery.  We will not be able to use the left leg for vein harvesting.  He is agreeable to proceed and scheduled for the next week.     I  spent 20 minutes counseling the patient face to face.   Davyn Elsasser O Malvern Kadlec 05/16/2021 5:02 PM       

## 2021-05-15 ENCOUNTER — Telehealth: Payer: Self-pay | Admitting: Licensed Clinical Social Worker

## 2021-05-15 NOTE — Telephone Encounter (Signed)
Pt texted this writer back on work phone to confirm he has a ride tomorrow for his CABG consultation. I f/u and provided pt with AHF clinic number and encouraged him to call and get his appts rescheduled since he has missed two since hospital. Pt w/ multiple admissions of hospital bills and outpatient visits- he would benefit from completing CAFA forms which he has been provided multiple times. I reminded him that social work team remains available to assist him with completing those applications as needed.   Octavio Graves, MSW, LCSW [facility] Heart/Vascular Care Navigation  [phone]

## 2021-05-15 NOTE — Telephone Encounter (Signed)
LCSW attempted again to reach out to pt to ensure he has a ride to appt tomorrow. Called [phone], not able to leave voicemail. Sent text message with my name and number inquiring if pt has ride. Pt has been enrolled in [facility] and can utilize as needed.   Octavio Graves, MSW, LCSW [facility] Heart/Vascular Care Navigation  [phone]

## 2021-05-16 ENCOUNTER — Ambulatory Visit (INDEPENDENT_AMBULATORY_CARE_PROVIDER_SITE_OTHER): Payer: Self-pay | Admitting: Thoracic Surgery (Cardiothoracic Vascular Surgery)

## 2021-05-16 ENCOUNTER — Other Ambulatory Visit: Payer: Self-pay

## 2021-05-16 ENCOUNTER — Encounter (HOSPITAL_COMMUNITY): Payer: Medicaid Other

## 2021-05-16 ENCOUNTER — Other Ambulatory Visit: Payer: Self-pay | Admitting: *Deleted

## 2021-05-16 VITALS — BP 100/73 | HR 96 | Resp 20 | Ht 72.0 in | Wt 243.0 lb

## 2021-05-16 DIAGNOSIS — I25119 Atherosclerotic heart disease of native coronary artery with unspecified angina pectoris: Secondary | ICD-10-CM

## 2021-05-19 ENCOUNTER — Telehealth: Payer: Self-pay | Admitting: *Deleted

## 2021-05-19 ENCOUNTER — Telehealth: Payer: Self-pay | Admitting: Orthopedic Surgery

## 2021-05-19 NOTE — Telephone Encounter (Signed)
Can you please call and make an appt for follow up with Erin next week or week after?   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 Lajoyce Corners in the hospital // dry dressing changes daily. Pt was to follow up in the office but has now been sch for heart surgery this Wednesday. HHN advised only approved 3 visits as the wound is healed and that they will eval x 3 and call with any questions and we will follow up for appt after his surgery this week.

## 2021-05-19 NOTE — Progress Notes (Signed)
Surgical Instructions    Your procedure is scheduled on Wednesday, October 26th.  Report to [facility] Main Entrance "A" at 5:30 A.M., then check in with the Admitting office.  Call this number if you have problems the morning of surgery:  [phone]   If you have any questions prior to your surgery date call [phone]: Open Monday-Friday 8am-4pm    Remember:  Do not eat or drink after midnight the night before your surgery    Take these medicines the morning of surgery with A SIP OF WATER   Atorvastatin (Lipitor)  Metoprolol Succinate   Mexiletine (Mexitil)  Torsemide (Demadex)  Follow your surgeon's instructions on when to stop Aspirin.  If no instructions were given by your surgeon then you will need to call the office to get those instructions.    As of today, STOP taking Aleve, Naproxen, Ibuprofen, Motrin, Advil, Goody's, BC's, all herbal medications, fish oil, and all vitamins.  WHAT DO I DO ABOUT MY DIABETES MEDICATION?   Do not take oral diabetes medicines (pills) the morning of surgery - Glyburide or Metformin  THE NIGHT BEFORE SURGERY, do NOT take evening dose of Glyburide (Diabeta)     HOW TO MANAGE YOUR DIABETES BEFORE AND AFTER SURGERY  Why is it important to control my blood sugar before and after surgery? Improving blood sugar levels before and after surgery helps healing and can limit problems. A way of improving blood sugar control is eating a healthy diet by:  Eating less sugar and carbohydrates  Increasing activity/exercise  Talking with your doctor about reaching your blood sugar goals High blood sugars (greater than 180 mg/dL) can raise your risk of infections and slow your recovery, so you will need to focus on controlling your diabetes during the weeks before surgery. Make sure that the doctor who takes care of your diabetes knows about your planned surgery including the date and location.  How do I manage my blood sugar before surgery? Check  your blood sugar at least 4 times a day, starting 2 days before surgery, to make sure that the level is not too high or low.  Check your blood sugar the morning of your surgery when you wake up and every 2 hours until you get to the Short Stay unit.  If your blood sugar is less than 70 mg/dL, you will need to treat for low blood sugar: Do not take insulin. Treat a low blood sugar (less than 70 mg/dL) with  cup of clear juice (cranberry or apple), 4 glucose tablets, OR glucose gel. Recheck blood sugar in 15 minutes after treatment (to make sure it is greater than 70 mg/dL). If your blood sugar is not greater than 70 mg/dL on recheck, call [phone] for further instructions. Report your blood sugar to the short stay nurse when you get to Short Stay.  If you are admitted to the hospital after surgery: Your blood sugar will be checked by the staff and you will probably be given insulin after surgery (instead of oral diabetes medicines) to make sure you have good blood sugar levels. The goal for blood sugar control after surgery is 80-180 mg/dL.    DAY OF SURGERY:         Do not wear jewelry Do not wear lotions, powders, colognes, or deodorant. Men may shave face and neck. Do not bring valuables to the hospital.             [facility] is not responsible for any belongings  or valuables.  Do NOT Smoke (Tobacco/Vaping)  24 hours prior to your procedure  If you use a CPAP at night, you may bring your mask for your overnight stay.   Contacts, glasses, hearing aids, dentures or partials may not be worn into surgery, please bring cases for these belongings   For patients admitted to the hospital, discharge time will be determined by your treatment team.   Patients discharged the day of surgery will not be allowed to drive home, and someone needs to stay with them for 24 hours.  NO VISITORS WILL BE ALLOWED IN PRE-OP WHERE PATIENTS ARE PREPPED FOR SURGERY.  ONLY 1 SUPPORT PERSON MAY BE  PRESENT IN THE WAITING ROOM WHILE YOU ARE IN SURGERY.  IF YOU ARE TO BE ADMITTED, ONCE YOU ARE IN YOUR ROOM YOU WILL BE ALLOWED TWO (2) VISITORS. 1 (ONE) VISITOR MAY STAY OVERNIGHT BUT MUST ARRIVE TO THE ROOM BY 8pm.  Minor children may have two parents present. Special consideration for safety and communication needs will be reviewed on a case by case basis.  Special instructions:    Oral Hygiene is also important to reduce your risk of infection.  Remember - BRUSH YOUR TEETH THE MORNING OF SURGERY WITH YOUR REGULAR TOOTHPASTE   [facility]- Preparing For Surgery  Before surgery, you can play an important role. Because skin is not sterile, your skin needs to be as free of germs as possible. You can reduce the number of germs on your skin by washing with CHG (chlorahexidine gluconate) Soap before surgery.  CHG is an antiseptic cleaner which kills germs and bonds with the skin to continue killing germs even after washing.     Please do not use if you have an allergy to CHG or antibacterial soaps. If your skin becomes reddened/irritated stop using the CHG.  Do not shave (including legs and underarms) for at least 48 hours prior to first CHG shower. It is OK to shave your face.  Please follow these instructions carefully.     Shower the NIGHT BEFORE SURGERY and the MORNING OF SURGERY with CHG Soap.   If you chose to wash your hair, wash your hair first as usual with your normal shampoo. After you shampoo, rinse your hair and body thoroughly to remove the shampoo.  Then [employer] and genitals (private parts) with your normal soap and rinse thoroughly to remove soap.  After that Use CHG Soap as you would any other liquid soap. You can apply CHG directly to the skin and wash gently with a scrungie or a clean washcloth.   Apply the CHG Soap to your body ONLY FROM THE NECK DOWN.  Do not use on open wounds or open sores. Avoid contact with your eyes, ears, mouth and genitals (private parts). Wash Face  and genitals (private parts)  with your normal soap.   Wash thoroughly, paying special attention to the area where your surgery will be performed.  Thoroughly rinse your body with warm water from the neck down.  DO NOT shower/wash with your normal soap after using and rinsing off the CHG Soap.  Pat yourself dry with a CLEAN TOWEL.  Wear CLEAN PAJAMAS to bed the night before surgery  Place CLEAN SHEETS on your bed the night before your surgery  DO NOT SLEEP WITH PETS.   Day of Surgery:  Take a shower with CHG soap. Wear Clean/Comfortable clothing the morning of surgery Do not apply any deodorants/lotions.   Remember to brush your  teeth WITH YOUR REGULAR TOOTHPASTE.   Please read over the following fact sheets that you were given.

## 2021-05-19 NOTE — Telephone Encounter (Signed)
Patient verified DOB Patient is aware of labs being stable. Patient has preadmission testing on tomorrow for scheduled heart surgery Wednesday. Patient will follow up as needed along with ortho after procedure.

## 2021-05-19 NOTE — Telephone Encounter (Signed)
-----   Message from Roney Jaffe, [place] sent at 05/13/2021  1:12 PM EDT ----- Please call patient and let him know that his kidney function is still decreased however stable, hemoglobin levels are stable as well, his magnesium is slightly below normal limits, he does need to continue to have this monitored.

## 2021-05-19 NOTE — Telephone Encounter (Signed)
Received call from Caron Presume nurse with [facility] she advised start of care was 05/16/2021 and she need verbal orders to continue wound care  1 Day 1, 0 wk 1, 1 wk 7 and 2 PRNs. Rinaldo Cloud said patient is having pre-op for open heart surgery. Rinaldo Cloud said the surgery is suppose to be done Wednesday 05/21/2021.   The number to contact Rinaldo Cloud is [phone] or [phone]    The fax# is [phone]

## 2021-05-20 ENCOUNTER — Encounter: Payer: Self-pay | Admitting: Orthopedic Surgery

## 2021-05-20 ENCOUNTER — Encounter (HOSPITAL_COMMUNITY): Payer: Self-pay

## 2021-05-20 ENCOUNTER — Encounter (HOSPITAL_COMMUNITY)
Admission: RE | Admit: 2021-05-20 | Discharge: 2021-05-20 | Disposition: A | Discharge status: Home / Self Care | Payer: Self-pay | Source: Ambulatory Visit | Attending: Thoracic Surgery (Cardiothoracic Vascular Surgery) | Admitting: Thoracic Surgery (Cardiothoracic Vascular Surgery)

## 2021-05-20 ENCOUNTER — Ambulatory Visit (HOSPITAL_COMMUNITY)
Admission: RE | Admit: 2021-05-20 | Discharge: 2021-05-20 | Disposition: A | Discharge status: Home / Self Care | Payer: Self-pay | Source: Ambulatory Visit | Attending: Thoracic Surgery (Cardiothoracic Vascular Surgery) | Admitting: Thoracic Surgery (Cardiothoracic Vascular Surgery)

## 2021-05-20 ENCOUNTER — Other Ambulatory Visit: Payer: Self-pay

## 2021-05-20 VITALS — BP 115/68 | HR 99 | Temp 98.7°F | Resp 19 | Ht 75.0 in | Wt 240.0 lb

## 2021-05-20 DIAGNOSIS — Z01818 Encounter for other preprocedural examination: Secondary | ICD-10-CM

## 2021-05-20 DIAGNOSIS — Z20822 Contact with and (suspected) exposure to covid-19: Secondary | ICD-10-CM | POA: Insufficient documentation

## 2021-05-20 DIAGNOSIS — Z951 Presence of aortocoronary bypass graft: Secondary | ICD-10-CM | POA: Insufficient documentation

## 2021-05-20 DIAGNOSIS — I25119 Atherosclerotic heart disease of native coronary artery with unspecified angina pectoris: Secondary | ICD-10-CM | POA: Insufficient documentation

## 2021-05-20 HISTORY — DX: Chronic kidney disease, unspecified: N18.9

## 2021-05-20 HISTORY — DX: Heart failure, unspecified: I50.9

## 2021-05-20 HISTORY — DX: Anemia, unspecified: D64.9

## 2021-05-20 LAB — COMPREHENSIVE METABOLIC PANEL
ALT: 11 U/L (ref 0–44)
AST: 11 U/L — ABNORMAL LOW (ref 15–41)
Albumin: 2.6 g/dL — ABNORMAL LOW (ref 3.5–5.0)
Alkaline Phosphatase: 53 U/L (ref 38–126)
Anion gap: 10 (ref 5–15)
BUN: 23 mg/dL — ABNORMAL HIGH (ref 6–20)
CO2: 21 mmol/L — ABNORMAL LOW (ref 22–32)
Calcium: 8.3 mg/dL — ABNORMAL LOW (ref 8.9–10.3)
Chloride: 104 mmol/L (ref 98–111)
Creatinine, Ser: 1.49 mg/dL — ABNORMAL HIGH (ref 0.61–1.24)
GFR, Estimated: 58 mL/min — ABNORMAL LOW (ref >60)
Glucose, Bld: 167 mg/dL — ABNORMAL HIGH (ref 70–99)
Potassium: 4 mmol/L (ref 3.5–5.1)
Sodium: 135 mmol/L (ref 135–145)
Total Bilirubin: 0.7 mg/dL (ref 0.3–1.2)
Total Protein: 6.3 g/dL — ABNORMAL LOW (ref 6.5–8.1)

## 2021-05-20 LAB — APTT: aPTT: 37 seconds — ABNORMAL HIGH (ref 24–36)

## 2021-05-20 LAB — PROTIME-INR
INR: 1.1 (ref 0.8–1.2)
Prothrombin Time: 14.4 seconds (ref 11.4–15.2)

## 2021-05-20 LAB — BLOOD GAS, ARTERIAL
Acid-base deficit: 1.2 mmol/L (ref 0.0–2.0)
Bicarbonate: 22.6 mmol/L (ref 20.0–28.0)
FIO2: 21
O2 Saturation: 98.1 %
Patient temperature: 37
pCO2 arterial: 35.3 mmHg (ref 32.0–48.0)
pH, Arterial: 7.422 (ref 7.350–7.450)
pO2, Arterial: 98.7 mmHg (ref 83.0–108.0)

## 2021-05-20 LAB — HEMOGLOBIN A1C
Hgb A1c MFr Bld: 6.5 % — ABNORMAL HIGH (ref 4.8–5.6)
Mean Plasma Glucose: 139.85 mg/dL

## 2021-05-20 LAB — CBC
HCT: 29.9 % — ABNORMAL LOW (ref 39.0–52.0)
Hemoglobin: 9.5 g/dL — ABNORMAL LOW (ref 13.0–17.0)
MCH: 28.9 pg (ref 26.0–34.0)
MCHC: 31.8 g/dL (ref 30.0–36.0)
MCV: 90.9 fL (ref 80.0–100.0)
Platelets: 273 10*3/uL (ref 150–400)
RBC: 3.29 MIL/uL — ABNORMAL LOW (ref 4.22–5.81)
RDW: 13.2 % (ref 11.5–15.5)
WBC: 9.6 10*3/uL (ref 4.0–10.5)
nRBC: 0 % (ref 0.0–0.2)

## 2021-05-20 LAB — URINALYSIS, ROUTINE W REFLEX MICROSCOPIC
Bilirubin Urine: NEGATIVE
Glucose, UA: 50 mg/dL — AB
Ketones, ur: NEGATIVE mg/dL
Leukocytes,Ua: NEGATIVE
Nitrite: NEGATIVE
Protein, ur: >=300 mg/dL — AB
RBC / HPF: >50 RBC/hpf — ABNORMAL HIGH (ref 0–5)
Specific Gravity, Urine: 1.012 (ref 1.005–1.030)
pH: 5 (ref 5.0–8.0)

## 2021-05-20 LAB — GLUCOSE, CAPILLARY: Glucose-Capillary: 151 mg/dL — ABNORMAL HIGH (ref 70–99)

## 2021-05-20 LAB — SARS CORONAVIRUS 2 (TAT 6-24 HRS): SARS Coronavirus 2: NEGATIVE

## 2021-05-20 IMAGING — CR DG CHEST 2V
2 series · 2 of 2 positions shown · non-contrast
Comparison: X-ray chest [DATE].

CLINICAL DATA: Pre-admit for coronary artery bypass grafting x3.
Patient is in the setting of congestive heart failure. History of
coronary artery disease, myocardial infarction and diabetes.

EXAM:
CHEST - 2 VIEW

[w chest pa]
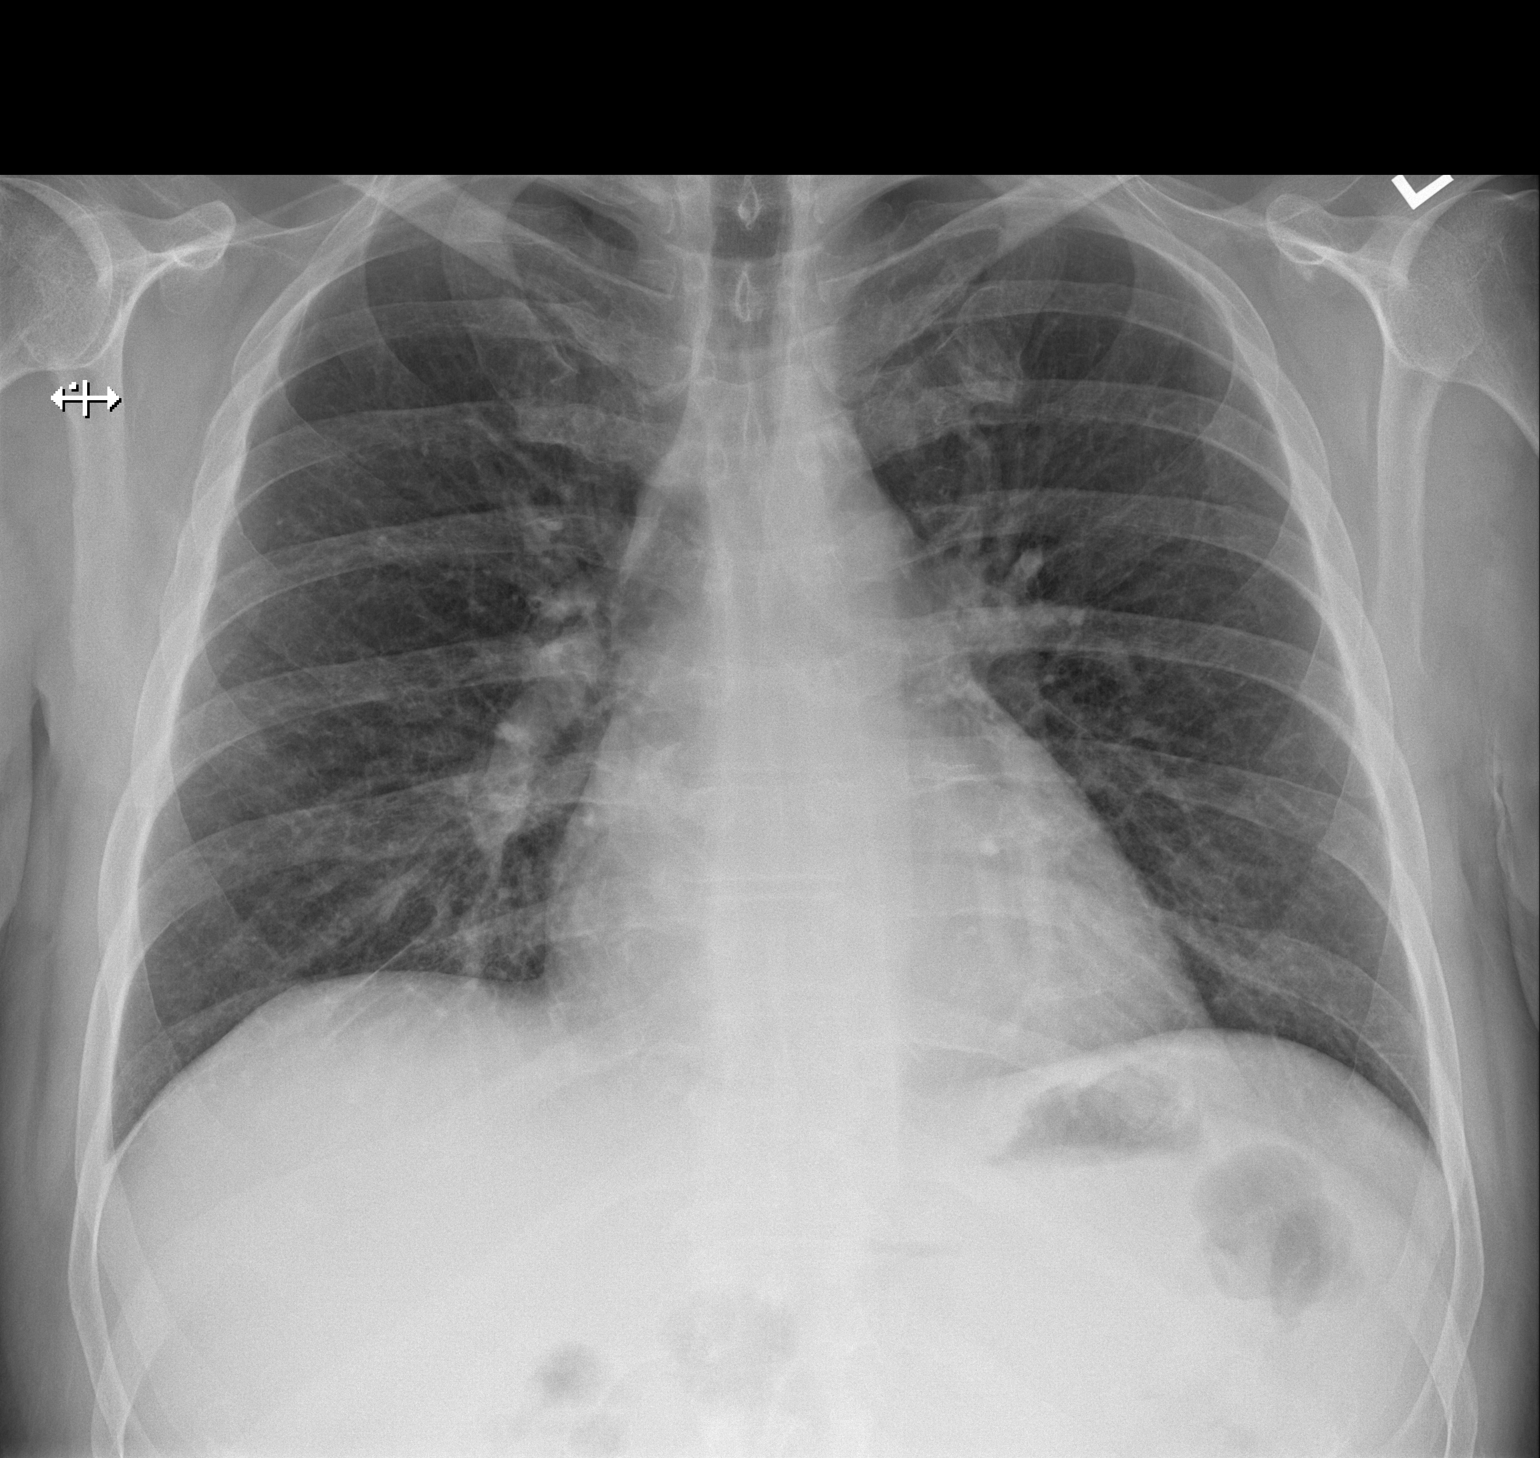

[w chest lat]
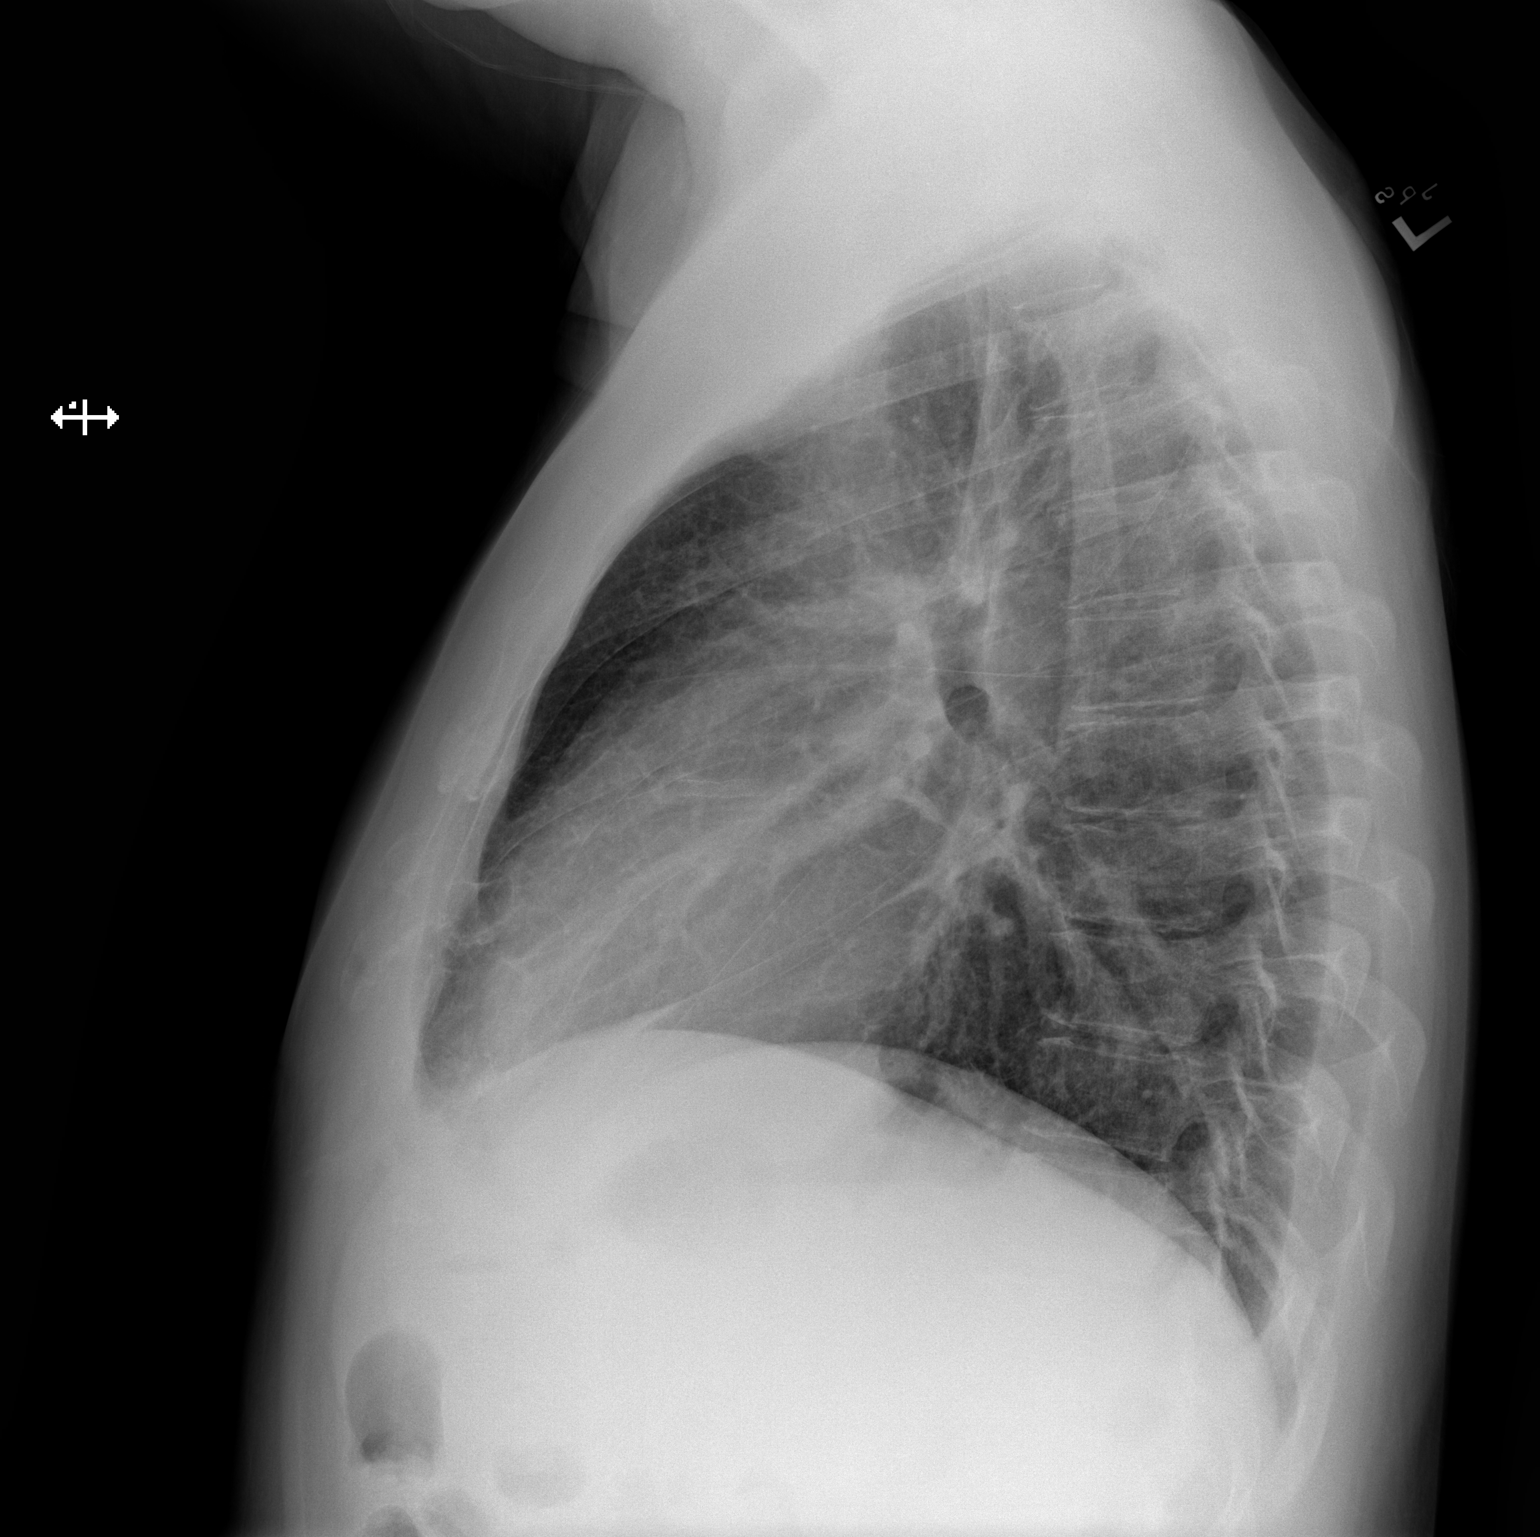

[2 of 2 positions shown; findings below may reference images not displayed]

FINDINGS: The heart size and mediastinal contours are within normal limits.
Both lungs are clear. The visualized skeletal structures are
unremarkable.
IMPRESSION: No active cardiopulmonary disease.

## 2021-05-20 NOTE — Anesthesia Preprocedure Evaluation (Addendum)
Anesthesia Evaluation  Patient identified by MRN, date of birth, ID band Patient awake    Reviewed: Allergy & Precautions, NPO status , Patient's Chart, lab work & pertinent test results  Airway Mallampati: II  TM Distance: >3 FB Neck ROM: Full    Dental  (+) Dental Advisory Given, Teeth Intact, Loose,    Pulmonary neg pulmonary ROS, former smoker,    Pulmonary exam normal breath sounds clear to auscultation- rhonchi       Cardiovascular + angina + CAD, + Past MI and +CHF  Normal cardiovascular exam Rhythm:Regular Rate:Normal     Neuro/Psych  Neuromuscular disease    GI/Hepatic negative GI ROS, Neg liver ROS,   Endo/Other  diabetes  Renal/GU Renal disease     Musculoskeletal negative musculoskeletal ROS (+)   Abdominal   Peds  Hematology  (+) Blood dyscrasia, anemia ,   Anesthesia Other Findings   Reproductive/Obstetrics                                                            Anesthesia Evaluation  Patient identified by MRN, date of birth, ID band Patient awake    Reviewed: Allergy & Precautions, NPO status , Patient's Chart, lab work & pertinent test results, reviewed documented beta blocker date and time   Airway Mallampati: III  TM Distance: >3 FB Neck ROM: Full    Dental  (+) Loose, Dental Advisory Given,    Pulmonary neg pulmonary ROS, former smoker,    Pulmonary exam normal breath sounds clear to auscultation       Cardiovascular + angina + CAD, + Past MI and +CHF  Normal cardiovascular exam Rhythm:Regular Rate:Normal  TTE 2022 1. Left ventricular ejection fraction, by estimation, is 25%. The left  ventricle has severely decreased function. The left ventricle demonstrates  global hypokinesis. The left ventricular internal cavity size was mildly  dilated. Left ventricular  diastolic parameters are consistent with Grade II diastolic dysfunction   (pseudonormalization).  2. Right ventricular systolic function is mildly reduced. The right  ventricular size is normal. The estimated right ventricular systolic  pressure is 59.1 mmHg.  3. Left atrial size was moderately dilated.  4. The mitral valve is normal in structure. Mild mitral valve  regurgitation. No evidence of mitral stenosis.  5. The aortic valve is tricuspid. Aortic valve regurgitation is mild. No  aortic stenosis is present.  6. The inferior vena cava is dilated in size with <50% respiratory  variability, suggesting right atrial pressure of 15 mmHg.  7. There is a left-sided pleural effusion.   Cath 2022 .  Ost LAD to Mid LAD lesion is 90% stenosed. .  Mid LAD to Dist LAD lesion is 90% stenosed. Jorene Minors LAD lesion is 100% stenosed. .  Prox Cx lesion is 70% stenosed. .  Prox RCA to Mid RCA lesion is 100% stenosed. .  Dist RCA lesion is 100% stenosed. .  Ramus lesion is 70% stenosed. .  1st Mrg lesion is 50% stenosed. .  Mid LM to Dist LM lesion is 60% stenosed.  Coronary angiography looks very similar to prior study in February.  Will review with Dr. Kipp Brood, plan for eventual CABG when leg lesions have healed    Neuro/Psych negative neurological ROS  negative psych ROS   GI/Hepatic  negative GI ROS, Neg liver ROS,   Endo/Other  diabetes, Type 2, Oral Hypoglycemic Agents  Renal/GU Renal InsufficiencyRenal diseaseLab Results      Component                Value               Date                      CREATININE               1.42 (H)            04/30/2021                BUN                      16                  04/30/2021                NA                       137                 04/30/2021                K                        3.8                 04/30/2021                CL                       104                 04/30/2021                CO2                      24                  04/30/2021             negative genitourinary    Musculoskeletal negative musculoskeletal ROS (+)   Abdominal   Peds  Hematology  (+) Blood dyscrasia, anemia , Lab Results      Component                Value               Date                      WBC                      6.9                 04/30/2021                HGB                      8.8 (L)             04/30/2021                HCT  27.8 (L)            04/30/2021                MCV                      90.8                04/30/2021                PLT                      180                 04/30/2021              Anesthesia Other Findings Left foot wound with osteomyelitis  Reproductive/Obstetrics                           Anesthesia Physical Anesthesia Plan  ASA: 4  Anesthesia Plan: MAC   Post-op Pain Management:    Induction: Intravenous  PONV Risk Score and Plan: 1 and Propofol infusion, Treatment may vary due to age or medical condition, Midazolam and Ondansetron  Airway Management Planned: Natural Airway  Additional Equipment:   Intra-op Plan:   Post-operative Plan:   Informed Consent: I have reviewed the patients History and Physical, chart, labs and discussed the procedure including the risks, benefits and alternatives for the proposed anesthesia with the patient or authorized representative who has indicated his/her understanding and acceptance.     Dental advisory given  Plan Discussed with: CRNA  Anesthesia Plan Comments:        Anesthesia Quick Evaluation  Anesthesia Physical Anesthesia Plan  ASA: 4  Anesthesia Plan: General   Post-op Pain Management:    Induction: Intravenous  PONV Risk Score and Plan: 3 and Treatment may vary due to age or medical condition and Midazolam  Airway Management Planned: Oral ETT  Additional Equipment: Arterial line, CVP, PA Cath, TEE and Ultrasound Guidance Line Placement  Intra-op Plan:   Post-operative Plan: Post-operative  intubation/ventilation  Informed Consent: I have reviewed the patients History and Physical, chart, labs and discussed the procedure including the risks, benefits and alternatives for the proposed anesthesia with the patient or authorized representative who has indicated his/her understanding and acceptance.     Dental advisory given  Plan Discussed with: CRNA  Anesthesia Plan Comments: (PAT note written 05/20/2021 by Myra Gianotti, PA-C. )      Anesthesia Quick Evaluation

## 2021-05-20 NOTE — Progress Notes (Signed)
Anesthesia Chart Review:  Case: [id number] Date/Time: 05/22/21 0714   Procedures:      CORONARY ARTERY BYPASS GRAFTING (CABG) (Chest)     RADIAL ARTERY HARVEST (Right: Arm Lower)     TRANSESOPHAGEAL ECHOCARDIOGRAM (TEE)   Anesthesia type: General   Pre-op diagnosis:      CAD     CHF   [facility] / [facility]   Surgeons: Corliss Skains, MD       DISCUSSION: Patient is a 50 year old male scheduled for the above procedure. Surgery was initially planned on 02/25/21, but he required admission 02/24/21 for severe sepsis with LLE gas gangrene/necrotizing fasciitis requiring multiple LLE surgeries. He was also treated for anemia with reflux esophagitis. CABG postponed until LLE wounds healing. 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  - [facility] Admission 02/24/21-03/15/21 for severe sepsis with AKI/septic shock with LLE gas gangrene/necrotizing fasciitis s/p multiple LLE surgeries for debridement. Antibiotics per ID. GI Tiajuana Amass, MD consulted on 03/11/21 for acute anemia with coffee ground emesis. 03/12/21 EGD showed grade D reflux esophagitis with ulceration/adherent clot and recent bleeding, but no active bleeding. Protonix 40 mg BID x 8 week, sucralfate QID x 2 week, repeat EGD in 8 weeks recommended. Dr. Tomasa Rand also noted, "Given his significant CAD and other risk factors, and the fact that his bleeding is from reflux, not PUD, the benefits of aspirin  likely outweigh the risks."  - [facility] admission 03/22/21-03/29/21 for acute on chronic CHF. NSTEMI. LHC 03/28/21. CABG recommended ideally once LLE infection improving.    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 S/p left foot 5th ray amputation 04/30/21. Discharged with Lifevest and mexiletine due to severely reduced LV function and high PVC burden.  Preoperative labs show BUN of 23, creatinine 1.49, hemoglobin 9.5, hematocrit 29.9.  These results are all consistent with most recent prior.  HGB communicated to RN Ryan at [facility]. 05/20/2021 presurgical COVID-19 test negative.  05/20/2021 chest x-ray is in process.  Anesthesia team to evaluate on the day of surgery.   VS: BP 115/68   Pulse 99   Temp 37.1 C (Oral)   Resp 19   Ht 6\' 3"  (1.905 m)   Wt 108.9 kg   SpO2 99%   BMI 30.00 kg/m    PROVIDERS: [facility], MD is cardiologist Norman Herrlich, MD is HF cardiologist Marca Ancona, MD is GI Tiajuana Amass, MD is orthopedic surgeon   LABS: Preoperative labs noted.  See DISCUSSION.  (all labs ordered are listed, but only abnormal results are displayed)  Labs Reviewed  GLUCOSE, CAPILLARY - Abnormal; Notable for the following components:      Result Value   Glucose-Capillary 151 (*)    All other components within normal limits  CBC - Abnormal; Notable for the following components:   RBC 3.29 (*)    Hemoglobin 9.5 (*)    HCT 29.9 (*)    All other components within normal limits  COMPREHENSIVE METABOLIC PANEL - Abnormal; Notable for the following components:   CO2 21 (*)    Glucose, Bld 167 (*)    BUN 23 (*)    Creatinine, Ser 1.49 (*)    Calcium 8.3 (*)    Total Protein 6.3 (*)    Albumin 2.6 (*)    AST 11 (*)    GFR, Estimated 58 (*)    All other components within normal limits  APTT - Abnormal; Notable for the following components:   aPTT 37 (*)    All other  components within normal limits  URINALYSIS, ROUTINE W REFLEX MICROSCOPIC - Abnormal; Notable for the following components:   Glucose, UA 50 (*)    Hgb urine dipstick LARGE (*)    Protein, ur >=300 (*)    RBC / HPF >50 (*)    Bacteria, UA RARE (*)    All other components within normal limits  HEMOGLOBIN A1C - Abnormal; Notable for the following components:   Hgb A1c MFr Bld 6.5 (*)    All other components within normal limits  SARS CORONAVIRUS 2 (TAT 6-24 HRS)  PROTIME-INR  BLOOD GAS, ARTERIAL  TYPE AND SCREEN    EGD 03/12/21 (for acute hemorrhagic anemia, coffee-ground emesis): IMPRESSION: - The examined portions of the nasopharynx, oropharynx and larynx were normal. - LA Grade D reflux esophagitis with ulceration/adherent clot and recent bleeding, but no active bleeding during the procedure - Clotted blood in the gastric body, otherwise normal stomach. - Normal examined duodenum. - No specimens collected.   IMAGES: CXR 05/20/21: In process.   EKG: 05/06/21: Sinus rhythm with occasional Premature ventricular complexes and Fusion complexes Anterior infarct , age undetermined ST-t wave abnormality Septal leads consider Serial changes of evolving Anteroseptal infarct - consider Anteroseptal ischemia ST & T wave abnormality, consider lateral ischemia Abnormal ECG Since previous tracing Septal ST-T waves now biphasic with TWI in V3 - concerning for Evolutionary changes of Ant-Septal MI. Confirmed by Bryan Lemma ([id number]) on 05/06/2021 11:36:20 PM  CV: Echo 03/24/21: IMPRESSIONS   1. Left ventricular ejection fraction, by estimation, is 25%. The left  ventricle has severely decreased function. The left ventricle demonstrates  global hypokinesis. The left ventricular internal cavity size was mildly  dilated. Left ventricular  diastolic parameters are consistent with Grade II diastolic dysfunction  (pseudonormalization).   2. Right ventricular systolic function is mildly  reduced. The right  ventricular size is normal. The estimated right ventricular systolic  pressure is 42.7 mmHg.   3. Left atrial size was moderately dilated.   4. The mitral valve is normal in structure. Mild mitral valve  regurgitation. No evidence of mitral stenosis.   5. The aortic valve is tricuspid. Aortic valve regurgitation is mild. No  aortic stenosis is present.   6. The inferior vena cava is dilated in size with <50% respiratory  variability, suggesting right atrial pressure of 15 mmHg.   7. There is a left-sided pleural effusion.  - Comparison: LVEF 30-35%, anterior, anteroseptal and inferoseptal hypokinesis 02/20/21; LVEF 40-45%, global LV hypokinesis 09/13/20; LVEF 30% 08/31/20   LHC 03/28/21:   Ost LAD to Mid LAD lesion is 90% stenosed.   Mid LAD to Dist LAD lesion is 90% stenosed.   Dist LAD lesion is 100% stenosed.   Prox Cx lesion is 70% stenosed.   Prox RCA to Mid RCA lesion is 100% stenosed.   Dist RCA lesion is 100% stenosed.   Ramus lesion is 70% stenosed.   1st Mrg lesion  is 50% stenosed.   Mid LM to Dist LM lesion is 60% stenosed.   Coronary angiography looks very similar to prior study in February.  Will review with Dr. Cliffton Asters, plan for eventual CABG when leg lesions have healed.    RHC 03/26/21: Hemodynamics (mmHg) RA mean 7 RV 41/10 PA 36/13, mean 27 PCWP mean 21  Oxygen saturations: PA 61% AO 95%  Cardiac Output (Fick) 9.33  Cardiac Index (Fick) 3.74 PVR < 1 WU  1. Preserved cardiac output.  2. Mildly elevated PCWP.  3. Mild pulmonary venous hypertension.     US Carotid 02/20/21: Summary:  Right Carotid: Velocities in the right ICA are consistent with a 1-39%  stenosis.  Left Carotid: Velocities in the left ICA are consistent with a 1-39%  stenosis.  Vertebrals:  Bilateral vertebral arteries demonstrate antegrade flow.  Subclavians: Normal flow hemodynamics were seen in bilateral subclavian arteries   MRI Cardiac  11/13/20: IMPRESSION: 1. Mild LVE with RWMAls distal septum anterior wall inferior apex and mid inferior wall. EF 40% 2. Viable myocardium No areas of thinning or akinesis. Small area of subendocardial scar in the mid and apical inferior wall    Past Medical History:  Diagnosis Date   Anemia    GI bleed; 03/13/11 EGD: relux esophagitis with ulceration/clot   CAD (coronary artery disease)    Cardiomyopathy (HCC)    CHF (congestive heart failure) (HCC)    CKD (chronic kidney disease)    Diabetic foot ulcer (HCC)    Dyslipidemia    Heart failure (HCC)    Myocardial infarction (HCC)    Type 2 diabetes mellitus (HCC)     Past Surgical History:  Procedure Laterality Date   AMPUTATION Left 04/30/2021   Procedure: LEFT FOOT 5TH RAY AMPUTATION APPLICATION OF A-CELL POWDER;  Surgeon: [name], MD;  Location[facility];  Service: Orthopedics;  Laterality: Left;   APPENDECTOMY     APPLICATION OF WOUND VAC Left 03/03/2021   Procedure: APPLICATION OF WOUND VAC;  Surgeon: Tarry Kos, MD;  Location: [facility];  Service: Orthopedics;  Laterality: Left;   APPLICATION OF WOUND VAC  03/07/2021   Procedure: APPLICATION OF WOUND VAC;  Surgeon: [name], MD;  Location: [facility];  Service: Orthopedics;;   CARDIAC CATHETERIZATION     COLON SURGERY     ESOPHAGOGASTRODUODENOSCOPY (EGD) WITH PROPOFOL N/A 03/12/2021   Procedure: ESOPHAGOGASTRODUODENOSCOPY (EGD) WITH PROPOFOL;  Surgeon: Jenel Lucks, MD;  Location: [facility] ENDOSCOPY;  Service: Gastroenterology;  Laterality: N/A;   I & D EXTREMITY Left 02/24/2021   Procedure: IRRIGATION AND DEBRIDEMENT EXTREMITY;  Surgeon: Myrene Galas, MD;  Location: [facility] OR;  Service: Orthopedics;  Laterality: Left;   I & D EXTREMITY Left 02/26/2021   Procedure: IRRIGATION AND DEBRIDEMENT OF LEG;  Surgeon: [name], MD;  Location: [facility] OR;  Service: Orthopedics;  Laterality: Left;   I & D EXTREMITY Left 03/03/2021   Procedure: LEFT LEG IRRIGATION AND DEBRIDEMENT;  Surgeon:  Tarry Kos, MD;  Location[facility];  Service: Orthopedics;  Laterality: Left;   I & D EXTREMITY Left 03/07/2021   Procedure: REPEAT DEBRIDEMENT LEFT LEG;  Surgeon: [name], MD;  Location: [facility] OR;  Service: Orthopedics;  Laterality: Left;   IR FLUORO GUIDE CV LINE RIGHT  03/10/2021   IR REMOVAL TUN CV CATH W/O FL  03/24/2021   IR US GUIDE VASC ACCESS RIGHT  03/10/2021   LEFT HEART CATH AND CORONARY ANGIOGRAPHY N/A 09/13/2020  Procedure: LEFT HEART CATH AND CORONARY ANGIOGRAPHY;  Surgeon: Lennette Bihari, MD;  Location: [facility] INVASIVE CV LAB;  Service: Cardiovascular;  Laterality: N/A;   LEFT HEART CATH AND CORONARY ANGIOGRAPHY N/A 03/28/2021   Procedure: LEFT HEART CATH AND CORONARY ANGIOGRAPHY;  Surgeon: Laurey Morale, MD;  Location: [facility] INVASIVE CV LAB;  Service: Cardiovascular;  Laterality: N/A;   RIGHT HEART CATH N/A 03/26/2021   Procedure: RIGHT HEART CATH;  Surgeon: Laurey Morale, MD;  Location: [facility] INVASIVE CV LAB;  Service: Cardiovascular;  Laterality: N/A;   TOE AMPUTATION  2020    MEDICATIONS:  aspirin 81 MG EC tablet   atorvastatin (LIPITOR) 80 MG tablet   glucose blood (TRUE METRIX BLOOD GLUCOSE TEST) test strip   glyBURIDE (DIABETA) 5 MG tablet   metFORMIN (GLUCOPHAGE) 500 MG tablet   metoprolol succinate (TOPROL-XL) 25 MG 24 hr tablet   mexiletine (MEXITIL) 200 MG capsule   potassium chloride SA (KLOR-CON) 20 MEQ tablet   sacubitril-valsartan (ENTRESTO) 24-26 MG   spironolactone (ALDACTONE) 25 MG tablet   torsemide (DEMADEX) 20 MG tablet   TRUEplus Lancets 28G MISC   No current facility-administered medications for this encounter.    Shonna Chock, PA-C Surgical Short Stay/Anesthesiology [facility] Phone [phone] [facility] Phone [phone] 05/20/2021 3:52 PM

## 2021-05-20 NOTE — Progress Notes (Signed)
Office Visit Note   Patient: Adam James           Date of Birth: February 26, 1971           MRN: [id number] Visit Date: 04/21/2021              Requested by: No referring provider defined for this encounter. PCP: Pcp, No  Chief Complaint  Patient presents with   Left Leg - Routine Post Op    03/07/2021 repeat debridement left leg, application of wound vac      HPI: Patient is a 50 year old gentleman who is 5 weeks status post repeat debridement left leg.  Patient states that he is healing well states he is unable to get the compression sock on.  He is been wrapping his leg.  Patient states he does have heart failure has shortness of breath and states that he needs a triple bypass.  Assessment & Plan: Visit Diagnoses: No diagnosis found.  Plan: Will wrap his leg with a Dynaflex compression wrap and follow-up weekly.  Follow-Up Instructions: No follow-ups on file.   Ortho Exam  Patient is alert, oriented, no adenopathy, well-dressed, normal affect, normal respiratory effort. Examination the extensile incision shows good granulation tissue over the dorsum of the foot.  Patient does have swelling of both lower extremities.  The extensile incision is healing well however patient does have a large ulcer beneath the fifth metatarsal head.  Imaging: No results found.     Labs: Lab Results  Component Value Date   HGBA1C 6.1 (H) 04/28/2021   HGBA1C 8.8 (H) 02/21/2021   ESRSEDRATE 25 (H) 03/14/2021   ESRSEDRATE >140 (H) 02/25/2021   CRP 1.6 (H) 03/14/2021   REPTSTATUS 05/06/2021 FINAL 04/30/2021   GRAMSTAIN NO WBC SEEN NO ORGANISMS SEEN  04/30/2021   CULT  04/30/2021    RARE PROVIDENCIA STUARTII RARE ALCALIGENES FAECALIS RARE NORMAL SKIN FLORA CRITICAL RESULT CALLED TO, READ BACK BY AND VERIFIED WITH: RN MARY S. [id number] FCP NO ANAEROBES ISOLATED Performed at [facility], [address] [place], [place] [address]    LABORGA PROVIDENCIA STUARTII 04/30/2021    LABORGA ALCALIGENES FAECALIS 04/30/2021     Lab Results  Component Value Date   ALBUMIN 2.8 (L) 04/28/2021   ALBUMIN 1.7 (L) 03/24/2021   ALBUMIN 1.5 (L) 03/15/2021    Lab Results  Component Value Date   MG 1.5 (L) 05/12/2021   MG 1.8 05/06/2021   MG 2.0 05/04/2021   No results found for: VD25OH  No results found for: PREALBUMIN CBC EXTENDED Latest Ref Rng & Units 05/12/2021 05/04/2021 05/03/2021  WBC 3.4 - 10.8 x10E3/uL 10.1 9.6 7.1  RBC 4.14 - 5.80 x10E6/uL 3.46(L) 3.28(L) 2.87(L)  HGB 13.0 - 17.7 g/dL 4.6(N) 6.2(X) 5.2(W)  HCT 37.5 - 51.0 % 30.9(L) 29.6(L) 25.9(L)  PLT 150 - 450 x10E3/uL 272 217 164  NEUTROABS 1.4 - 7.0 x10E3/uL 5.1 - 3.6  LYMPHSABS 0.7 - 3.1 x10E3/uL 4.2(H) - 2.7     Body mass index is 31.25 kg/m.  Orders:  No orders of the defined types were placed in this encounter.  No orders of the defined types were placed in this encounter.    Procedures: No procedures performed  Clinical Data: No additional findings.  ROS:  All other systems negative, except as noted in the HPI. Review of Systems  Objective: Vital Signs: Ht 6\' 3"  (1.905 m)   Wt 250 lb (113.4 kg)   BMI 31.25 kg/m   Specialty  Comments:  No specialty comments available.  PMFS History: Patient Active Problem List   Diagnosis Date Noted   Acute osteomyelitis of left foot (HCC)    Acute exacerbation of CHF (congestive heart failure) (HCC) 04/28/2021   Venous stasis ulcer of right calf limited to breakdown of skin without varicose veins (HCC)    Non-pressure chronic ulcer of other part of left foot limited to breakdown of skin (HCC)    Osteomyelitis (HCC)    Acute on chronic combined systolic and diastolic CHF (congestive heart failure) (HCC)    Acute on chronic heart failure (HCC) 03/23/2021   NSTEMI (non-ST elevated myocardial infarction) (HCC)    Gastroesophageal reflux disease with esophagitis and hemorrhage    Anemia, posthemorrhagic, acute    Acute esophagitis     Gastrointestinal hemorrhage    Necrotizing fasciitis (HCC)    Left leg cellulitis    Diabetic polyneuropathy associated with type 2 diabetes mellitus (HCC)    Severe protein-calorie malnutrition (HCC)    Gangrene of left foot (HCC) 02/25/2021   Type 2 diabetes mellitus (HCC)    Heart failure (HCC)    Dyslipidemia    Diabetic foot ulcer (HCC)    CAD (coronary artery disease)    Cardiomyopathy (HCC)    Coronary artery disease involving native coronary artery of native heart with angina pectoris [facility])    Past Medical History:  Diagnosis Date   CAD (coronary artery disease)    Cardiomyopathy (HCC)    Diabetic foot ulcer (HCC)    Dyslipidemia    Heart failure (HCC)    Myocardial infarction (HCC)    Type 2 diabetes mellitus (HCC)     Family History  Problem Relation Age of Onset   Hypertension Mother    Heart failure Father    Hypertension Brother    Congestive Heart Failure Brother     Past Surgical History:  Procedure Laterality Date   AMPUTATION Left 04/30/2021   Procedure: LEFT FOOT 5TH RAY AMPUTATION APPLICATION OF A-CELL POWDER;  Surgeon: [name], MD;  Location: [facility] OR;  Service: Orthopedics;  Laterality: Left;   APPENDECTOMY     APPLICATION OF WOUND VAC Left 03/03/2021   Procedure: APPLICATION OF WOUND VAC;  Surgeon: Tarry Kos, MD;  Location: [facility] OR;  Service: Orthopedics;  Laterality: Left;   APPLICATION OF WOUND VAC  03/07/2021   Procedure: APPLICATION OF WOUND VAC;  Surgeon: [name], MD;  Location: [facility] OR;  Service: Orthopedics;;   CARDIAC CATHETERIZATION     COLON SURGERY     ESOPHAGOGASTRODUODENOSCOPY (EGD) WITH PROPOFOL N/A 03/12/2021   Procedure: ESOPHAGOGASTRODUODENOSCOPY (EGD) WITH PROPOFOL;  Surgeon: Jenel Lucks, MD;  Location[facility] ENDOSCOPY;  Service: Gastroenterology;  Laterality: N/A;   I & D EXTREMITY Left 02/24/2021   Procedure: IRRIGATION AND DEBRIDEMENT EXTREMITY;  Surgeon: Myrene Galas, MD;  Location: [facility] OR;  Service: Orthopedics;   Laterality: Left;   I & D EXTREMITY Left 02/26/2021   Procedure: IRRIGATION AND DEBRIDEMENT OF LEG;  Surgeon: [name], MD;  Location: [facility] OR;  Service: Orthopedics;  Laterality: Left;   I & D EXTREMITY Left 03/03/2021   Procedure: LEFT LEG IRRIGATION AND DEBRIDEMENT;  Surgeon: Tarry Kos, MD;  Location: [facility] OR;  Service: Orthopedics;  Laterality: Left;   I & D EXTREMITY Left 03/07/2021   Procedure: REPEAT DEBRIDEMENT LEFT LEG;  Surgeon: [name], MD;  Location: [facility] OR;  Service: Orthopedics;  Laterality: Left;   IR FLUORO GUIDE CV LINE  RIGHT  03/10/2021   IR REMOVAL TUN CV CATH W/O FL  03/24/2021   IR US GUIDE VASC ACCESS RIGHT  03/10/2021   LEFT HEART CATH AND CORONARY ANGIOGRAPHY N/A 09/13/2020   Procedure: LEFT HEART CATH AND CORONARY ANGIOGRAPHY;  Surgeon: Lennette Bihari, MD;  Location: [facility] INVASIVE CV LAB;  Service: Cardiovascular;  Laterality: N/A;   LEFT HEART CATH AND CORONARY ANGIOGRAPHY N/A 03/28/2021   Procedure: LEFT HEART CATH AND CORONARY ANGIOGRAPHY;  Surgeon: Laurey Morale, MD;  Location: [facility] INVASIVE CV LAB;  Service: Cardiovascular;  Laterality: N/A;   RIGHT HEART CATH N/A 03/26/2021   Procedure: RIGHT HEART CATH;  Surgeon: Laurey Morale, MD;  Location: [facility] INVASIVE CV LAB;  Service: Cardiovascular;  Laterality: N/A;   TOE AMPUTATION  2020   Social History   Occupational History   Occupation: none    Comment: former [employer]"  Tobacco Use   Smoking status: Former    Packs/day: 3.00    Years: 36.00    Pack years: 108.00    Types: Cigarettes    Quit date: 08/27/2020    Years since quitting: 0.7   Smokeless tobacco: Never   Tobacco comments:    5-6 daily  Vaping Use   Vaping Use: Never used  Substance and Sexual Activity   Alcohol use: Not Currently    Comment: Very rare   Drug use: Not Currently    Types: Marijuana   Sexual activity: Not on file

## 2021-05-20 NOTE — Progress Notes (Addendum)
PCP - denies Cardiologist - [place]  Chest x-ray - 05/20/21 EKG - 05/06/21 ECHO - 03/24/21 Cardiac Cath - 03/28/21  DM - type 2, does not check fasting CBGs per patient   COVID TEST- 05/20/21 (surgery admit)   Anesthesia review: yes, heart history Abnormal labs Hct 29.9 Hgb 9.5  Patient denies shortness of breath, fever, cough and chest pain at PAT appointment   All instructions explained to the patient, with a verbal understanding of the material. Patient agrees to go over the instructions while at home for a better understanding. Patient also instructed to self quarantine after being tested for COVID-19. The opportunity to ask questions was provided.

## 2021-05-21 MED ORDER — MANNITOL 20 % IV SOLN
INTRAVENOUS | Status: DC
  Filled 2021-05-21: qty 13

## 2021-05-21 MED ORDER — NITROGLYCERIN IN D5W 200-5 MCG/ML-% IV SOLN
2.0000 ug/min | INTRAVENOUS | Status: DC
  Filled 2021-05-21: qty 250

## 2021-05-21 MED ORDER — PLASMA-LYTE A IV SOLN
INTRAVENOUS | Status: DC
  Filled 2021-05-21: qty 5

## 2021-05-21 MED ORDER — TRANEXAMIC ACID 1000 MG/10ML IV SOLN
1.5000 mg/kg/h | INTRAVENOUS | Status: AC
  Administered 2021-05-22: 1.5 mg/kg/h via INTRAVENOUS
  Filled 2021-05-21: qty 25

## 2021-05-21 MED ORDER — TRANEXAMIC ACID (OHS) PUMP PRIME SOLUTION
2.0000 mg/kg | INTRAVENOUS | Status: DC
  Filled 2021-05-21: qty 2.18

## 2021-05-21 MED ORDER — VANCOMYCIN HCL 1500 MG/300ML IV SOLN
1500.0000 mg | INTRAVENOUS | Status: AC
  Administered 2021-05-22: 1500 mg via INTRAVENOUS
  Filled 2021-05-21: qty 300

## 2021-05-21 MED ORDER — POTASSIUM CHLORIDE 2 MEQ/ML IV SOLN
80.0000 meq | INTRAVENOUS | Status: DC
  Filled 2021-05-21: qty 40

## 2021-05-21 MED ORDER — PHENYLEPHRINE HCL-NACL 20-0.9 MG/250ML-% IV SOLN
30.0000 ug/min | INTRAVENOUS | Status: AC
  Administered 2021-05-22: 20 ug/min via INTRAVENOUS
  Filled 2021-05-21: qty 250

## 2021-05-21 MED ORDER — TRANEXAMIC ACID (OHS) BOLUS VIA INFUSION
15.0000 mg/kg | INTRAVENOUS | Status: AC
  Administered 2021-05-22: 1633.5 mg via INTRAVENOUS
  Filled 2021-05-21: qty 1634

## 2021-05-21 MED ORDER — EPINEPHRINE HCL 5 MG/250ML IV SOLN IN NS
0.0000 ug/min | INTRAVENOUS | Status: DC
  Filled 2021-05-21: qty 250

## 2021-05-21 MED ORDER — DEXMEDETOMIDINE HCL IN NACL 400 MCG/100ML IV SOLN
0.1000 ug/kg/h | INTRAVENOUS | Status: AC
  Administered 2021-05-22: .4 ug/kg/h via INTRAVENOUS
  Filled 2021-05-21: qty 100

## 2021-05-21 MED ORDER — NOREPINEPHRINE 4 MG/250ML-% IV SOLN
0.0000 ug/min | INTRAVENOUS | Status: AC
  Administered 2021-05-22: 2 ug/min via INTRAVENOUS
  Filled 2021-05-21: qty 250

## 2021-05-21 MED ORDER — CEFAZOLIN SODIUM-DEXTROSE 2-4 GM/100ML-% IV SOLN
2.0000 g | INTRAVENOUS | Status: AC
  Administered 2021-05-22: 2 g via INTRAVENOUS
  Filled 2021-05-21: qty 100

## 2021-05-21 MED ORDER — MILRINONE LACTATE IN DEXTROSE 20-5 MG/100ML-% IV SOLN
0.3000 ug/kg/min | INTRAVENOUS | Status: DC
  Filled 2021-05-21: qty 100

## 2021-05-21 MED ORDER — INSULIN REGULAR(HUMAN) IN NACL 100-0.9 UT/100ML-% IV SOLN
INTRAVENOUS | Status: AC
  Administered 2021-05-22: 4.6 [IU]/h via INTRAVENOUS
  Filled 2021-05-21: qty 100

## 2021-05-21 MED ORDER — HEPARIN 30,000 UNITS/1000 ML (OHS) CELLSAVER SOLUTION
Status: DC
  Filled 2021-05-21: qty 1000

## 2021-05-22 ENCOUNTER — Inpatient Hospital Stay (HOSPITAL_COMMUNITY): Payer: Self-pay

## 2021-05-22 ENCOUNTER — Other Ambulatory Visit: Payer: Self-pay

## 2021-05-22 ENCOUNTER — Encounter (HOSPITAL_COMMUNITY): Payer: Self-pay | Admitting: Thoracic Surgery (Cardiothoracic Vascular Surgery)

## 2021-05-22 ENCOUNTER — Inpatient Hospital Stay (HOSPITAL_COMMUNITY)
Admission: RE | Disposition: A | Discharge status: Home / Self Care | Payer: Self-pay | Source: Home / Self Care | Attending: Thoracic Surgery (Cardiothoracic Vascular Surgery)

## 2021-05-22 ENCOUNTER — Inpatient Hospital Stay (HOSPITAL_COMMUNITY): Payer: Self-pay | Admitting: Anesthesiology

## 2021-05-22 ENCOUNTER — Inpatient Hospital Stay (HOSPITAL_COMMUNITY)
Admission: RE | Admit: 2021-05-22 | Discharge: 2021-05-29 | DRG: 235 | Disposition: A | Discharge status: Home / Self Care | Payer: Self-pay | Attending: Thoracic Surgery (Cardiothoracic Vascular Surgery) | Admitting: Thoracic Surgery (Cardiothoracic Vascular Surgery)

## 2021-05-22 DIAGNOSIS — Z09 Encounter for follow-up examination after completed treatment for conditions other than malignant neoplasm: Secondary | ICD-10-CM

## 2021-05-22 DIAGNOSIS — E785 Hyperlipidemia, unspecified: Secondary | ICD-10-CM | POA: Diagnosis present

## 2021-05-22 DIAGNOSIS — E11628 Type 2 diabetes mellitus with other skin complications: Secondary | ICD-10-CM | POA: Diagnosis present

## 2021-05-22 DIAGNOSIS — Z8719 Personal history of other diseases of the digestive system: Secondary | ICD-10-CM

## 2021-05-22 DIAGNOSIS — E1165 Type 2 diabetes mellitus with hyperglycemia: Secondary | ICD-10-CM | POA: Diagnosis present

## 2021-05-22 DIAGNOSIS — I2510 Atherosclerotic heart disease of native coronary artery without angina pectoris: Secondary | ICD-10-CM

## 2021-05-22 DIAGNOSIS — I5022 Chronic systolic (congestive) heart failure: Secondary | ICD-10-CM

## 2021-05-22 DIAGNOSIS — J939 Pneumothorax, unspecified: Secondary | ICD-10-CM

## 2021-05-22 DIAGNOSIS — E871 Hypo-osmolality and hyponatremia: Secondary | ICD-10-CM | POA: Diagnosis not present

## 2021-05-22 DIAGNOSIS — Z6831 Body mass index (BMI) 31.0-31.9, adult: Secondary | ICD-10-CM

## 2021-05-22 DIAGNOSIS — Z955 Presence of coronary angioplasty implant and graft: Secondary | ICD-10-CM

## 2021-05-22 DIAGNOSIS — I272 Pulmonary hypertension, unspecified: Secondary | ICD-10-CM | POA: Diagnosis present

## 2021-05-22 DIAGNOSIS — L97529 Non-pressure chronic ulcer of other part of left foot with unspecified severity: Secondary | ICD-10-CM | POA: Diagnosis present

## 2021-05-22 DIAGNOSIS — M869 Osteomyelitis, unspecified: Secondary | ICD-10-CM | POA: Diagnosis present

## 2021-05-22 DIAGNOSIS — Z8249 Family history of ischemic heart disease and other diseases of the circulatory system: Secondary | ICD-10-CM

## 2021-05-22 DIAGNOSIS — I5023 Acute on chronic systolic (congestive) heart failure: Secondary | ICD-10-CM | POA: Diagnosis present

## 2021-05-22 DIAGNOSIS — E1122 Type 2 diabetes mellitus with diabetic chronic kidney disease: Secondary | ICD-10-CM | POA: Diagnosis present

## 2021-05-22 DIAGNOSIS — D62 Acute posthemorrhagic anemia: Secondary | ICD-10-CM | POA: Diagnosis not present

## 2021-05-22 DIAGNOSIS — Z951 Presence of aortocoronary bypass graft: Secondary | ICD-10-CM

## 2021-05-22 DIAGNOSIS — R570 Cardiogenic shock: Secondary | ICD-10-CM | POA: Diagnosis not present

## 2021-05-22 DIAGNOSIS — J90 Pleural effusion, not elsewhere classified: Secondary | ICD-10-CM

## 2021-05-22 DIAGNOSIS — I472 Ventricular tachycardia, unspecified: Secondary | ICD-10-CM | POA: Diagnosis not present

## 2021-05-22 DIAGNOSIS — I255 Ischemic cardiomyopathy: Secondary | ICD-10-CM | POA: Diagnosis present

## 2021-05-22 DIAGNOSIS — N170 Acute kidney failure with tubular necrosis: Secondary | ICD-10-CM | POA: Diagnosis not present

## 2021-05-22 DIAGNOSIS — J9601 Acute respiratory failure with hypoxia: Secondary | ICD-10-CM | POA: Diagnosis present

## 2021-05-22 DIAGNOSIS — I25119 Atherosclerotic heart disease of native coronary artery with unspecified angina pectoris: Secondary | ICD-10-CM

## 2021-05-22 DIAGNOSIS — I130 Hypertensive heart and chronic kidney disease with heart failure and stage 1 through stage 4 chronic kidney disease, or unspecified chronic kidney disease: Secondary | ICD-10-CM | POA: Diagnosis present

## 2021-05-22 DIAGNOSIS — I252 Old myocardial infarction: Secondary | ICD-10-CM

## 2021-05-22 DIAGNOSIS — Z89429 Acquired absence of other toe(s), unspecified side: Secondary | ICD-10-CM

## 2021-05-22 DIAGNOSIS — Z87891 Personal history of nicotine dependence: Secondary | ICD-10-CM

## 2021-05-22 DIAGNOSIS — D509 Iron deficiency anemia, unspecified: Secondary | ICD-10-CM | POA: Diagnosis present

## 2021-05-22 DIAGNOSIS — Z79899 Other long term (current) drug therapy: Secondary | ICD-10-CM

## 2021-05-22 DIAGNOSIS — N1832 Chronic kidney disease, stage 3b: Secondary | ICD-10-CM | POA: Diagnosis present

## 2021-05-22 HISTORY — PX: TEE WITHOUT CARDIOVERSION: SHX5443

## 2021-05-22 HISTORY — PX: CORONARY ARTERY BYPASS GRAFT: SHX141

## 2021-05-22 HISTORY — PX: ENDOVEIN HARVEST OF GREATER SAPHENOUS VEIN: SHX5059

## 2021-05-22 LAB — POCT I-STAT 7, (LYTES, BLD GAS, ICA,H+H)
Acid-Base Excess: 1 mmol/L (ref 0.0–2.0)
Acid-Base Excess: 1 mmol/L (ref 0.0–2.0)
Acid-Base Excess: 1 mmol/L (ref 0.0–2.0)
Acid-Base Excess: 2 mmol/L (ref 0.0–2.0)
Acid-Base Excess: 1 mmol/L (ref 0.0–2.0)
Acid-Base Excess: 0 mmol/L (ref 0.0–2.0)
Acid-Base Excess: 0 mmol/L (ref 0.0–2.0)
Acid-base deficit: 2 mmol/L (ref 0.0–2.0)
Acid-base deficit: 2 mmol/L (ref 0.0–2.0)
Acid-base deficit: 2 mmol/L (ref 0.0–2.0)
Bicarbonate: 26.6 mmol/L (ref 20.0–28.0)
Bicarbonate: 26.4 mmol/L (ref 20.0–28.0)
Bicarbonate: 26.9 mmol/L (ref 20.0–28.0)
Bicarbonate: 26.9 mmol/L (ref 20.0–28.0)
Bicarbonate: 25.3 mmol/L (ref 20.0–28.0)
Bicarbonate: 26 mmol/L (ref 20.0–28.0)
Bicarbonate: 25 mmol/L (ref 20.0–28.0)
Bicarbonate: 24.9 mmol/L (ref 20.0–28.0)
Bicarbonate: 23.4 mmol/L (ref 20.0–28.0)
Bicarbonate: 23.7 mmol/L (ref 20.0–28.0)
Calcium, Ion: 1.18 mmol/L (ref 1.15–1.40)
Calcium, Ion: 1.02 mmol/L — ABNORMAL LOW (ref 1.15–1.40)
Calcium, Ion: 1.05 mmol/L — ABNORMAL LOW (ref 1.15–1.40)
Calcium, Ion: 1.07 mmol/L — ABNORMAL LOW (ref 1.15–1.40)
Calcium, Ion: 1.04 mmol/L — ABNORMAL LOW (ref 1.15–1.40)
Calcium, Ion: 1.26 mmol/L (ref 1.15–1.40)
Calcium, Ion: 1.25 mmol/L (ref 1.15–1.40)
Calcium, Ion: 1.22 mmol/L (ref 1.15–1.40)
Calcium, Ion: 1.22 mmol/L (ref 1.15–1.40)
Calcium, Ion: 1.24 mmol/L (ref 1.15–1.40)
HCT: 25 % — ABNORMAL LOW (ref 39.0–52.0)
HCT: 24 % — ABNORMAL LOW (ref 39.0–52.0)
HCT: 25 % — ABNORMAL LOW (ref 39.0–52.0)
HCT: 27 % — ABNORMAL LOW (ref 39.0–52.0)
HCT: 24 % — ABNORMAL LOW (ref 39.0–52.0)
HCT: 24 % — ABNORMAL LOW (ref 39.0–52.0)
HCT: 27 % — ABNORMAL LOW (ref 39.0–52.0)
HCT: 25 % — ABNORMAL LOW (ref 39.0–52.0)
HCT: 26 % — ABNORMAL LOW (ref 39.0–52.0)
HCT: 26 % — ABNORMAL LOW (ref 39.0–52.0)
Hemoglobin: 8.5 g/dL — ABNORMAL LOW (ref 13.0–17.0)
Hemoglobin: 8.2 g/dL — ABNORMAL LOW (ref 13.0–17.0)
Hemoglobin: 8.5 g/dL — ABNORMAL LOW (ref 13.0–17.0)
Hemoglobin: 9.2 g/dL — ABNORMAL LOW (ref 13.0–17.0)
Hemoglobin: 8.2 g/dL — ABNORMAL LOW (ref 13.0–17.0)
Hemoglobin: 8.2 g/dL — ABNORMAL LOW (ref 13.0–17.0)
Hemoglobin: 9.2 g/dL — ABNORMAL LOW (ref 13.0–17.0)
Hemoglobin: 8.5 g/dL — ABNORMAL LOW (ref 13.0–17.0)
Hemoglobin: 8.8 g/dL — ABNORMAL LOW (ref 13.0–17.0)
Hemoglobin: 8.8 g/dL — ABNORMAL LOW (ref 13.0–17.0)
O2 Saturation: 100 %
O2 Saturation: 100 %
O2 Saturation: 100 %
O2 Saturation: 72 %
O2 Saturation: 100 %
O2 Saturation: 99 %
O2 Saturation: 99 %
O2 Saturation: 100 %
O2 Saturation: 99 %
O2 Saturation: 99 %
Patient temperature: 37
Patient temperature: 37
Patient temperature: 37.3
Patient temperature: 37.3
Potassium: 3.7 mmol/L (ref 3.5–5.1)
Potassium: 4 mmol/L (ref 3.5–5.1)
Potassium: 4.1 mmol/L (ref 3.5–5.1)
Potassium: 4.4 mmol/L (ref 3.5–5.1)
Potassium: 5.9 mmol/L — ABNORMAL HIGH (ref 3.5–5.1)
Potassium: 5 mmol/L (ref 3.5–5.1)
Potassium: 5 mmol/L (ref 3.5–5.1)
Potassium: 5.2 mmol/L — ABNORMAL HIGH (ref 3.5–5.1)
Potassium: 5.2 mmol/L — ABNORMAL HIGH (ref 3.5–5.1)
Potassium: 5.4 mmol/L — ABNORMAL HIGH (ref 3.5–5.1)
Sodium: 139 mmol/L (ref 135–145)
Sodium: 139 mmol/L (ref 135–145)
Sodium: 140 mmol/L (ref 135–145)
Sodium: 141 mmol/L (ref 135–145)
Sodium: 138 mmol/L (ref 135–145)
Sodium: 139 mmol/L (ref 135–145)
Sodium: 138 mmol/L (ref 135–145)
Sodium: 136 mmol/L (ref 135–145)
Sodium: 136 mmol/L (ref 135–145)
Sodium: 136 mmol/L (ref 135–145)
TCO2: 28 mmol/L (ref 22–32)
TCO2: 28 mmol/L (ref 22–32)
TCO2: 28 mmol/L (ref 22–32)
TCO2: 28 mmol/L (ref 22–32)
TCO2: 26 mmol/L (ref 22–32)
TCO2: 27 mmol/L (ref 22–32)
TCO2: 27 mmol/L (ref 22–32)
TCO2: 26 mmol/L (ref 22–32)
TCO2: 25 mmol/L (ref 22–32)
TCO2: 25 mmol/L (ref 22–32)
pCO2 arterial: 47.6 mmHg (ref 32.0–48.0)
pCO2 arterial: 43.1 mmHg (ref 32.0–48.0)
pCO2 arterial: 45 mmHg (ref 32.0–48.0)
pCO2 arterial: 52.7 mmHg — ABNORMAL HIGH (ref 32.0–48.0)
pCO2 arterial: 35.7 mmHg (ref 32.0–48.0)
pCO2 arterial: 49.6 mmHg — ABNORMAL HIGH (ref 32.0–48.0)
pCO2 arterial: 52.1 mmHg — ABNORMAL HIGH (ref 32.0–48.0)
pCO2 arterial: 42.6 mmHg (ref 32.0–48.0)
pCO2 arterial: 42.2 mmHg (ref 32.0–48.0)
pCO2 arterial: 43.4 mmHg (ref 32.0–48.0)
pH, Arterial: 7.355 (ref 7.350–7.450)
pH, Arterial: 7.316 — ABNORMAL LOW (ref 7.350–7.450)
pH, Arterial: 7.376 (ref 7.350–7.450)
pH, Arterial: 7.404 (ref 7.350–7.450)
pH, Arterial: 7.459 — ABNORMAL HIGH (ref 7.350–7.450)
pH, Arterial: 7.327 — ABNORMAL LOW (ref 7.350–7.450)
pH, Arterial: 7.289 — ABNORMAL LOW (ref 7.350–7.450)
pH, Arterial: 7.376 (ref 7.350–7.450)
pH, Arterial: 7.353 (ref 7.350–7.450)
pH, Arterial: 7.348 — ABNORMAL LOW (ref 7.350–7.450)
pO2, Arterial: 277 mmHg — ABNORMAL HIGH (ref 83.0–108.0)
pO2, Arterial: 279 mmHg — ABNORMAL HIGH (ref 83.0–108.0)
pO2, Arterial: 357 mmHg — ABNORMAL HIGH (ref 83.0–108.0)
pO2, Arterial: 42 mmHg — ABNORMAL LOW (ref 83.0–108.0)
pO2, Arterial: 336 mmHg — ABNORMAL HIGH (ref 83.0–108.0)
pO2, Arterial: 174 mmHg — ABNORMAL HIGH (ref 83.0–108.0)
pO2, Arterial: 139 mmHg — ABNORMAL HIGH (ref 83.0–108.0)
pO2, Arterial: 176 mmHg — ABNORMAL HIGH (ref 83.0–108.0)
pO2, Arterial: 129 mmHg — ABNORMAL HIGH (ref 83.0–108.0)
pO2, Arterial: 147 mmHg — ABNORMAL HIGH (ref 83.0–108.0)

## 2021-05-22 LAB — BASIC METABOLIC PANEL
Anion gap: 7 (ref 5–15)
BUN: 25 mg/dL — ABNORMAL HIGH (ref 6–20)
CO2: 22 mmol/L (ref 22–32)
Calcium: 8.2 mg/dL — ABNORMAL LOW (ref 8.9–10.3)
Chloride: 106 mmol/L (ref 98–111)
Creatinine, Ser: 1.69 mg/dL — ABNORMAL HIGH (ref 0.61–1.24)
GFR, Estimated: 49 mL/min — ABNORMAL LOW (ref >60)
Glucose, Bld: 130 mg/dL — ABNORMAL HIGH (ref 70–99)
Potassium: 5.3 mmol/L — ABNORMAL HIGH (ref 3.5–5.1)
Sodium: 135 mmol/L (ref 135–145)

## 2021-05-22 LAB — CBC
HCT: 27.4 % — ABNORMAL LOW (ref 39.0–52.0)
HCT: 28.5 % — ABNORMAL LOW (ref 39.0–52.0)
Hemoglobin: 8.7 g/dL — ABNORMAL LOW (ref 13.0–17.0)
Hemoglobin: 9.3 g/dL — ABNORMAL LOW (ref 13.0–17.0)
MCH: 28.3 pg (ref 26.0–34.0)
MCH: 28.7 pg (ref 26.0–34.0)
MCHC: 31.8 g/dL (ref 30.0–36.0)
MCHC: 32.6 g/dL (ref 30.0–36.0)
MCV: 89.3 fL (ref 80.0–100.0)
MCV: 88 fL (ref 80.0–100.0)
Platelets: 220 10*3/uL (ref 150–400)
Platelets: 284 10*3/uL (ref 150–400)
RBC: 3.07 MIL/uL — ABNORMAL LOW (ref 4.22–5.81)
RBC: 3.24 MIL/uL — ABNORMAL LOW (ref 4.22–5.81)
RDW: 13.3 % (ref 11.5–15.5)
RDW: 13.4 % (ref 11.5–15.5)
WBC: 17.7 10*3/uL — ABNORMAL HIGH (ref 4.0–10.5)
WBC: 15.7 10*3/uL — ABNORMAL HIGH (ref 4.0–10.5)
nRBC: 0 % (ref 0.0–0.2)
nRBC: 0 % (ref 0.0–0.2)

## 2021-05-22 LAB — POCT I-STAT, CHEM 8
BUN: 22 mg/dL — ABNORMAL HIGH (ref 6–20)
BUN: 21 mg/dL — ABNORMAL HIGH (ref 6–20)
BUN: 21 mg/dL — ABNORMAL HIGH (ref 6–20)
Calcium, Ion: 1.19 mmol/L (ref 1.15–1.40)
Calcium, Ion: 1.08 mmol/L — ABNORMAL LOW (ref 1.15–1.40)
Calcium, Ion: 1.25 mmol/L (ref 1.15–1.40)
Chloride: 101 mmol/L (ref 98–111)
Chloride: 102 mmol/L (ref 98–111)
Chloride: 103 mmol/L (ref 98–111)
Creatinine, Ser: 1.4 mg/dL — ABNORMAL HIGH (ref 0.61–1.24)
Creatinine, Ser: 1.3 mg/dL — ABNORMAL HIGH (ref 0.61–1.24)
Creatinine, Ser: 1.4 mg/dL — ABNORMAL HIGH (ref 0.61–1.24)
Glucose, Bld: 134 mg/dL — ABNORMAL HIGH (ref 70–99)
Glucose, Bld: 142 mg/dL — ABNORMAL HIGH (ref 70–99)
Glucose, Bld: 148 mg/dL — ABNORMAL HIGH (ref 70–99)
HCT: 23 % — ABNORMAL LOW (ref 39.0–52.0)
HCT: 24 % — ABNORMAL LOW (ref 39.0–52.0)
HCT: 22 % — ABNORMAL LOW (ref 39.0–52.0)
Hemoglobin: 7.8 g/dL — ABNORMAL LOW (ref 13.0–17.0)
Hemoglobin: 8.2 g/dL — ABNORMAL LOW (ref 13.0–17.0)
Hemoglobin: 7.5 g/dL — ABNORMAL LOW (ref 13.0–17.0)
Potassium: 3.7 mmol/L (ref 3.5–5.1)
Potassium: 4.2 mmol/L (ref 3.5–5.1)
Potassium: 5 mmol/L (ref 3.5–5.1)
Sodium: 138 mmol/L (ref 135–145)
Sodium: 138 mmol/L (ref 135–145)
Sodium: 138 mmol/L (ref 135–145)
TCO2: 27 mmol/L (ref 22–32)
TCO2: 27 mmol/L (ref 22–32)
TCO2: 26 mmol/L (ref 22–32)

## 2021-05-22 LAB — APTT: aPTT: 31 seconds (ref 24–36)

## 2021-05-22 LAB — GLUCOSE, CAPILLARY
Glucose-Capillary: 137 mg/dL — ABNORMAL HIGH (ref 70–99)
Glucose-Capillary: 154 mg/dL — ABNORMAL HIGH (ref 70–99)
Glucose-Capillary: 127 mg/dL — ABNORMAL HIGH (ref 70–99)
Glucose-Capillary: 115 mg/dL — ABNORMAL HIGH (ref 70–99)
Glucose-Capillary: 138 mg/dL — ABNORMAL HIGH (ref 70–99)
Glucose-Capillary: 142 mg/dL — ABNORMAL HIGH (ref 70–99)
Glucose-Capillary: 131 mg/dL — ABNORMAL HIGH (ref 70–99)
Glucose-Capillary: 128 mg/dL — ABNORMAL HIGH (ref 70–99)
Glucose-Capillary: 130 mg/dL — ABNORMAL HIGH (ref 70–99)
Glucose-Capillary: 131 mg/dL — ABNORMAL HIGH (ref 70–99)

## 2021-05-22 LAB — ECHO INTRAOPERATIVE TEE
Height: 75 in
Weight: 3840 oz

## 2021-05-22 LAB — PROTIME-INR
INR: 1.4 — ABNORMAL HIGH (ref 0.8–1.2)
Prothrombin Time: 16.8 seconds — ABNORMAL HIGH (ref 11.4–15.2)

## 2021-05-22 LAB — PREPARE RBC (CROSSMATCH)

## 2021-05-22 LAB — HEMOGLOBIN AND HEMATOCRIT, BLOOD
HCT: 22.9 % — ABNORMAL LOW (ref 39.0–52.0)
Hemoglobin: 7.6 g/dL — ABNORMAL LOW (ref 13.0–17.0)

## 2021-05-22 LAB — MAGNESIUM: Magnesium: 3.2 mg/dL — ABNORMAL HIGH (ref 1.7–2.4)

## 2021-05-22 LAB — PLATELET COUNT: Platelets: 224 10*3/uL (ref 150–400)

## 2021-05-22 IMAGING — DX DG CHEST 1V PORT
1 series · 1 of 1 positions shown · non-contrast
Comparison: Chest radiograph dated [DATE].

CLINICAL DATA: Status post coronary artery bypass graft (CABG).

EXAM:
PORTABLE CHEST 1 VIEW

[chest]
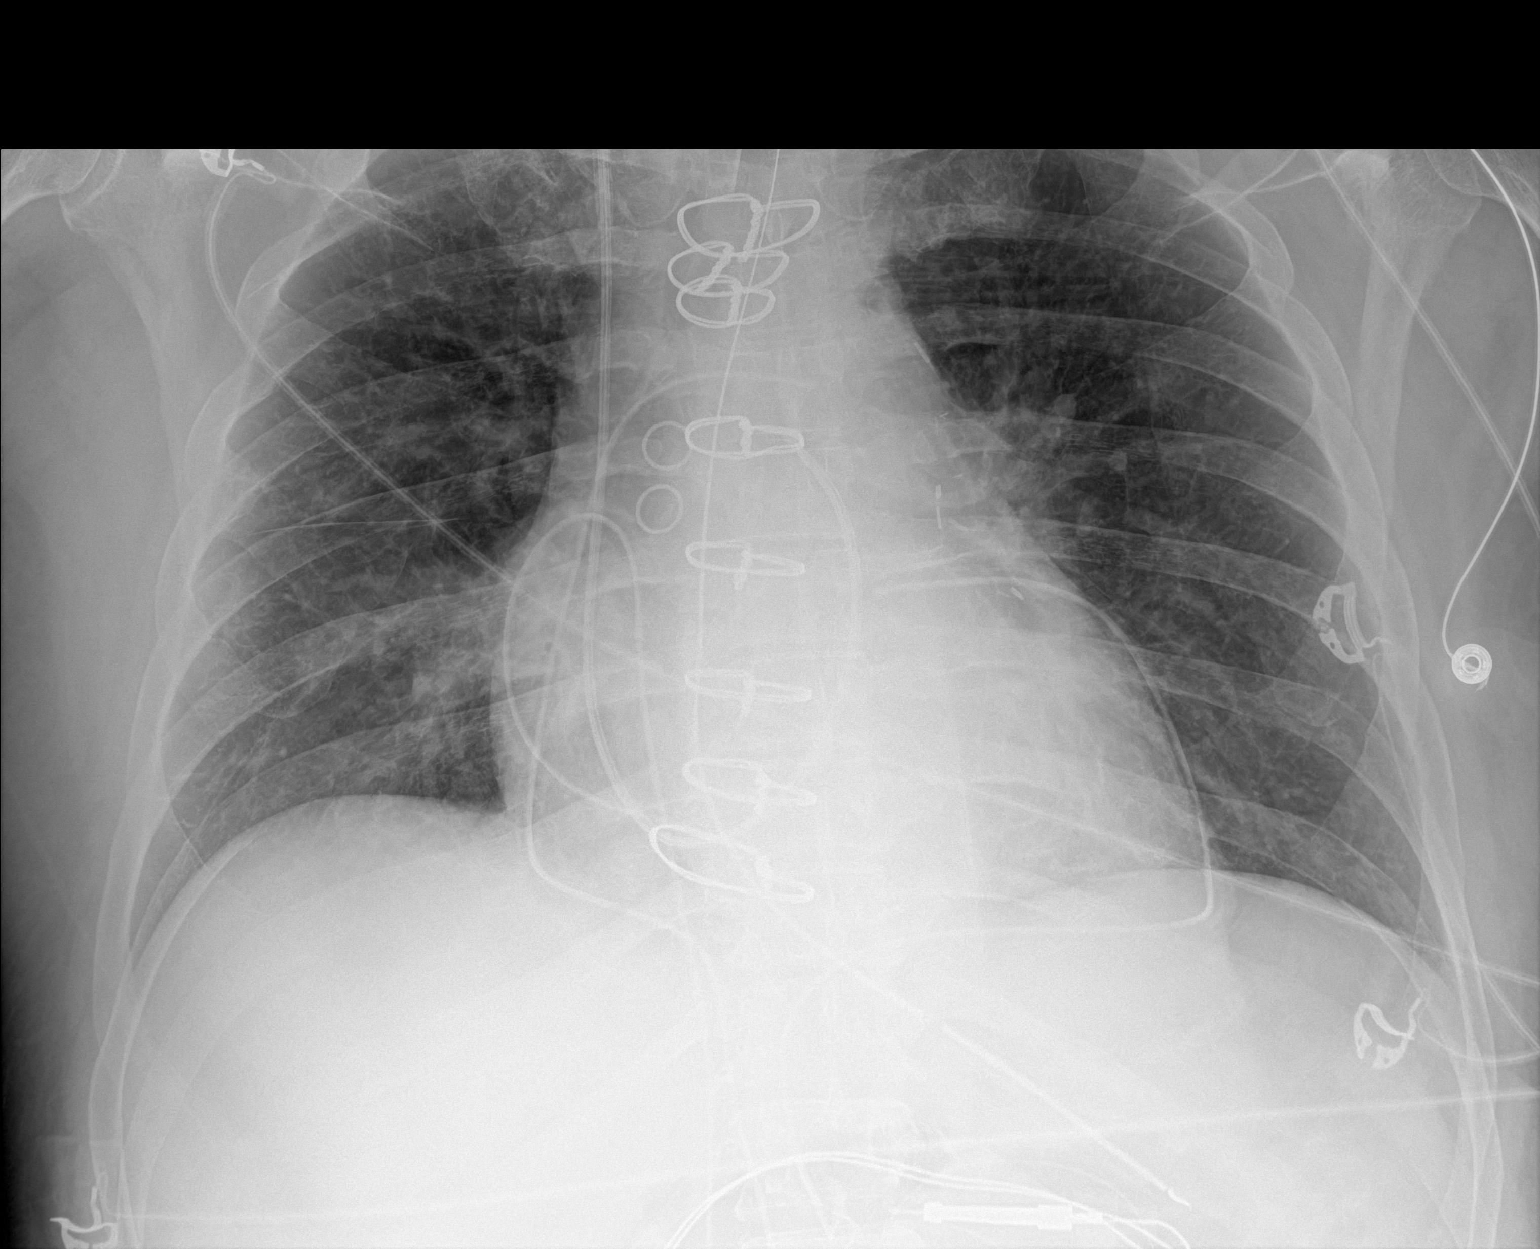

[1 of 1 positions shown; findings below may reference images not displayed]

FINDINGS: An endotracheal tube terminates in the midthoracic trachea. An
enteric tube terminates in the stomach with the side port near the
gastroesophageal junction. A right internal jugular swans Ganz
catheter tip overlies the main pulmonary artery. Three mediastinal
drains are noted.

The lungs are clear without pleural effusion. There is suggestion of
a trace pneumothorax in the left lung apex which may be
postoperative in nature. There is no significant pneumothorax on the
right. Median sternotomy wires are noted.
IMPRESSION: Suggestion of a trace pneumothorax in the left lung apex may be
postoperative.

These results will be called to the ordering clinician or
representative by the Radiologist Assistant, and communication
documented in the PACS or [REDACTED].

## 2021-05-22 SURGERY — CORONARY ARTERY BYPASS GRAFTING (CABG)
Anesthesia: General | Site: Chest | Laterality: Right

## 2021-05-22 MED ORDER — AMIODARONE HCL IN DEXTROSE 360-4.14 MG/200ML-% IV SOLN
30.0000 mg/h | INTRAVENOUS | Status: DC
  Administered 2021-05-22 – 2021-05-23 (×2): 30 mg/h via INTRAVENOUS
  Filled 2021-05-22 (×2): qty 200

## 2021-05-22 MED ORDER — LACTATED RINGERS IV SOLN: INTRAVENOUS | Status: DC | PRN

## 2021-05-22 MED ORDER — SODIUM CHLORIDE 0.9% FLUSH: 3.0000 mL | INTRAVENOUS | Status: DC | PRN

## 2021-05-22 MED ORDER — ROCURONIUM BROMIDE 10 MG/ML (PF) SYRINGE
PREFILLED_SYRINGE | INTRAVENOUS | Status: AC
  Filled 2021-05-22: qty 10

## 2021-05-22 MED ORDER — ORAL CARE MOUTH RINSE
15.0000 mL | OROMUCOSAL | Status: DC
  Administered 2021-05-22: 15 mL via OROMUCOSAL

## 2021-05-22 MED ORDER — METOPROLOL TARTRATE 12.5 MG HALF TABLET: 12.5000 mg | ORAL_TABLET | Freq: Once | ORAL | Status: DC

## 2021-05-22 MED ORDER — AMIODARONE HCL IN DEXTROSE 360-4.14 MG/200ML-% IV SOLN
INTRAVENOUS | Status: DC | PRN
  Administered 2021-05-22: 60 mg/h via INTRAVENOUS

## 2021-05-22 MED ORDER — PROTAMINE SULFATE 10 MG/ML IV SOLN
INTRAVENOUS | Status: AC
  Filled 2021-05-22: qty 5

## 2021-05-22 MED ORDER — ARTIFICIAL TEARS OPHTHALMIC OINT
TOPICAL_OINTMENT | OPHTHALMIC | Status: DC | PRN
Start: 2021-05-22 — End: 2021-05-22
  Administered 2021-05-22: 1 via OPHTHALMIC

## 2021-05-22 MED ORDER — DEXTROSE 50 % IV SOLN: 0.0000 mL | INTRAVENOUS | Status: DC | PRN

## 2021-05-22 MED ORDER — AMIODARONE HCL IN DEXTROSE 360-4.14 MG/200ML-% IV SOLN
30.0000 mg/h | INTRAVENOUS | Status: DC
  Filled 2021-05-22: qty 200

## 2021-05-22 MED ORDER — ROCURONIUM BROMIDE 100 MG/10ML IV SOLN
INTRAVENOUS | Status: DC | PRN
  Administered 2021-05-22: 50 mg via INTRAVENOUS
  Administered 2021-05-22: 100 mg via INTRAVENOUS
  Administered 2021-05-22: 50 mg via INTRAVENOUS

## 2021-05-22 MED ORDER — BISACODYL 10 MG RE SUPP: 10.0000 mg | Freq: Every day | RECTAL | Status: DC

## 2021-05-22 MED ORDER — METOPROLOL TARTRATE 12.5 MG HALF TABLET
12.5000 mg | ORAL_TABLET | Freq: Two times a day (BID) | ORAL | Status: DC
  Administered 2021-05-22 – 2021-05-23 (×2): 12.5 mg via ORAL
  Filled 2021-05-22 (×3): qty 1

## 2021-05-22 MED ORDER — PROPOFOL 10 MG/ML IV BOLUS
INTRAVENOUS | Status: DC | PRN
  Administered 2021-05-22: 50 mg via INTRAVENOUS
  Administered 2021-05-22: 20 mg via INTRAVENOUS

## 2021-05-22 MED ORDER — CEFAZOLIN SODIUM-DEXTROSE 2-4 GM/100ML-% IV SOLN
2.0000 g | Freq: Three times a day (TID) | INTRAVENOUS | Status: AC
Start: 2021-05-22 — End: 2021-05-24
  Administered 2021-05-22 – 2021-05-24 (×6): 2 g via INTRAVENOUS
  Filled 2021-05-22 (×6): qty 100

## 2021-05-22 MED ORDER — INSULIN REGULAR(HUMAN) IN NACL 100-0.9 UT/100ML-% IV SOLN: INTRAVENOUS | Status: DC

## 2021-05-22 MED ORDER — DOCUSATE SODIUM 100 MG PO CAPS
200.0000 mg | ORAL_CAPSULE | Freq: Every day | ORAL | Status: DC
  Filled 2021-05-22 (×5): qty 2

## 2021-05-22 MED ORDER — SODIUM CHLORIDE 0.9% FLUSH: 10.0000 mL | INTRAVENOUS | Status: DC | PRN

## 2021-05-22 MED ORDER — SODIUM CHLORIDE 0.9 % IV BOLUS
250.0000 mL | INTRAVENOUS | Status: DC | PRN
Start: 2021-05-22 — End: 2021-05-29

## 2021-05-22 MED ORDER — SODIUM CHLORIDE (PF) 0.9 % IJ SOLN
OROMUCOSAL | Status: DC | PRN
  Administered 2021-05-22 (×2): 4 mL via TOPICAL

## 2021-05-22 MED ORDER — SODIUM CHLORIDE 0.9% FLUSH
10.0000 mL | Freq: Two times a day (BID) | INTRAVENOUS | Status: DC
  Administered 2021-05-23 – 2021-05-28 (×11): 10 mL

## 2021-05-22 MED ORDER — ALBUMIN HUMAN 25 % IV SOLN
12.5000 g | INTRAVENOUS | Status: DC | PRN
  Filled 2021-05-22: qty 50

## 2021-05-22 MED ORDER — NICARDIPINE HCL IN NACL 20-0.86 MG/200ML-% IV SOLN
0.0000 mg/h | INTRAVENOUS | Status: DC
Start: 2021-05-22 — End: 2021-05-23

## 2021-05-22 MED ORDER — CHLORHEXIDINE GLUCONATE CLOTH 2 % EX PADS
6.0000 | MEDICATED_PAD | Freq: Every day | CUTANEOUS | Status: DC
  Administered 2021-05-23 – 2021-05-29 (×6): 6 via TOPICAL

## 2021-05-22 MED ORDER — ACETAMINOPHEN 650 MG RE SUPP
650.0000 mg | Freq: Once | RECTAL | Status: AC
  Administered 2021-05-22: 650 mg via RECTAL

## 2021-05-22 MED ORDER — MAGNESIUM SULFATE 4 GM/100ML IV SOLN
4.0000 g | Freq: Once | INTRAVENOUS | Status: AC
  Administered 2021-05-22: 4 g via INTRAVENOUS
  Filled 2021-05-22: qty 100

## 2021-05-22 MED ORDER — PROPOFOL 10 MG/ML IV BOLUS
INTRAVENOUS | Status: AC
  Filled 2021-05-22: qty 20

## 2021-05-22 MED ORDER — MIDAZOLAM HCL 2 MG/2ML IJ SOLN
2.0000 mg | INTRAMUSCULAR | Status: DC | PRN
  Administered 2021-05-22: 2 mg via INTRAVENOUS
  Filled 2021-05-22: qty 2

## 2021-05-22 MED ORDER — SODIUM CHLORIDE 0.9% FLUSH
3.0000 mL | Freq: Two times a day (BID) | INTRAVENOUS | Status: DC
  Administered 2021-05-23 – 2021-05-29 (×10): 3 mL via INTRAVENOUS

## 2021-05-22 MED ORDER — DEXMEDETOMIDINE HCL IN NACL 400 MCG/100ML IV SOLN
0.0000 ug/kg/h | INTRAVENOUS | Status: DC
  Administered 2021-05-22: 0.7 ug/kg/h via INTRAVENOUS
  Filled 2021-05-22: qty 100

## 2021-05-22 MED ORDER — MIDAZOLAM HCL 2 MG/2ML IJ SOLN
INTRAMUSCULAR | Status: AC
  Filled 2021-05-22: qty 2

## 2021-05-22 MED ORDER — FENTANYL CITRATE PF 50 MCG/ML IJ SOSY
50.0000 ug | PREFILLED_SYRINGE | INTRAMUSCULAR | Status: DC | PRN
  Administered 2021-05-22: 100 ug via INTRAVENOUS
  Administered 2021-05-22 – 2021-05-24 (×6): 50 ug via INTRAVENOUS
  Filled 2021-05-22 (×2): qty 1
  Filled 2021-05-22: qty 2
  Filled 2021-05-22 (×2): qty 1
  Filled 2021-05-22: qty 2
  Filled 2021-05-22: qty 1

## 2021-05-22 MED ORDER — CHLORHEXIDINE GLUCONATE 0.12 % MT SOLN
15.0000 mL | Freq: Once | OROMUCOSAL | Status: AC
  Administered 2021-05-22: 15 mL via OROMUCOSAL
  Filled 2021-05-22: qty 15

## 2021-05-22 MED ORDER — MIDAZOLAM HCL (PF) 10 MG/2ML IJ SOLN
INTRAMUSCULAR | Status: AC
  Filled 2021-05-22: qty 2

## 2021-05-22 MED ORDER — PHENYLEPHRINE HCL-NACL 20-0.9 MG/250ML-% IV SOLN: 0.0000 ug/min | INTRAVENOUS | Status: DC

## 2021-05-22 MED ORDER — ALBUMIN HUMAN 5 % IV SOLN: INTRAVENOUS | Status: DC | PRN

## 2021-05-22 MED ORDER — NITROGLYCERIN IN D5W 200-5 MCG/ML-% IV SOLN
0.0000 ug/min | INTRAVENOUS | Status: DC
Start: 2021-05-22 — End: 2021-05-23

## 2021-05-22 MED ORDER — SODIUM CHLORIDE 0.9 % IV SOLN: 250.0000 mL | INTRAVENOUS | Status: DC

## 2021-05-22 MED ORDER — CHLORHEXIDINE GLUCONATE CLOTH 2 % EX PADS
6.0000 | MEDICATED_PAD | Freq: Every day | CUTANEOUS | Status: DC
  Administered 2021-05-22 – 2021-05-28 (×6): 6 via TOPICAL

## 2021-05-22 MED ORDER — MANNITOL 20 % IV SOLN: INTRAVENOUS | Status: DC

## 2021-05-22 MED ORDER — SODIUM CHLORIDE 0.9 % IV SOLN
INTRAVENOUS | Status: DC | PRN
Start: 2021-05-22 — End: 2021-05-22

## 2021-05-22 MED ORDER — FENTANYL CITRATE (PF) 250 MCG/5ML IJ SOLN
INTRAMUSCULAR | Status: AC
  Filled 2021-05-22: qty 25

## 2021-05-22 MED ORDER — ARTIFICIAL TEARS OPHTHALMIC OINT
TOPICAL_OINTMENT | OPHTHALMIC | Status: AC
  Filled 2021-05-22: qty 3.5

## 2021-05-22 MED ORDER — LACTATED RINGERS IV SOLN: 500.0000 mL | Freq: Once | INTRAVENOUS | Status: DC | PRN

## 2021-05-22 MED ORDER — HEPARIN SODIUM (PORCINE) 1000 UNIT/ML IJ SOLN
INTRAMUSCULAR | Status: DC | PRN
  Administered 2021-05-22: 38000 [IU] via INTRAVENOUS

## 2021-05-22 MED ORDER — 0.9 % SODIUM CHLORIDE (POUR BTL) OPTIME
TOPICAL | Status: DC | PRN
  Administered 2021-05-22: 5000 mL

## 2021-05-22 MED ORDER — ACETAMINOPHEN 500 MG PO TABS
1000.0000 mg | ORAL_TABLET | Freq: Four times a day (QID) | ORAL | Status: AC
Start: 2021-05-23 — End: 2021-05-27
  Administered 2021-05-22 – 2021-05-27 (×17): 1000 mg via ORAL
  Filled 2021-05-22 (×18): qty 2

## 2021-05-22 MED ORDER — LACTATED RINGERS IV SOLN: INTRAVENOUS | Status: DC

## 2021-05-22 MED ORDER — METOPROLOL TARTRATE 5 MG/5ML IV SOLN: 2.5000 mg | INTRAVENOUS | Status: DC | PRN

## 2021-05-22 MED ORDER — PANTOPRAZOLE SODIUM 40 MG PO TBEC
40.0000 mg | DELAYED_RELEASE_TABLET | Freq: Every day | ORAL | Status: DC
  Administered 2021-05-24 – 2021-05-29 (×6): 40 mg via ORAL
  Filled 2021-05-22 (×6): qty 1

## 2021-05-22 MED ORDER — ONDANSETRON HCL 4 MG/2ML IJ SOLN
4.0000 mg | Freq: Four times a day (QID) | INTRAMUSCULAR | Status: DC | PRN
  Administered 2021-05-23 – 2021-05-26 (×7): 4 mg via INTRAVENOUS
  Filled 2021-05-22 (×6): qty 2

## 2021-05-22 MED ORDER — FENTANYL CITRATE (PF) 250 MCG/5ML IJ SOLN
INTRAMUSCULAR | Status: DC | PRN
  Administered 2021-05-22: 100 ug via INTRAVENOUS
  Administered 2021-05-22: 50 ug via INTRAVENOUS
  Administered 2021-05-22: 100 ug via INTRAVENOUS
  Administered 2021-05-22: 25 ug via INTRAVENOUS
  Administered 2021-05-22: 150 ug via INTRAVENOUS
  Administered 2021-05-22: 300 ug via INTRAVENOUS
  Administered 2021-05-22: 100 ug via INTRAVENOUS
  Administered 2021-05-22: 150 ug via INTRAVENOUS
  Administered 2021-05-22: 125 ug via INTRAVENOUS

## 2021-05-22 MED ORDER — AMIODARONE HCL IN DEXTROSE 360-4.14 MG/200ML-% IV SOLN
60.0000 mg/h | INTRAVENOUS | Status: AC
  Administered 2021-05-22 (×2): 60 mg/h via INTRAVENOUS
  Filled 2021-05-22: qty 200

## 2021-05-22 MED ORDER — PHENYLEPHRINE 40 MCG/ML (10ML) SYRINGE FOR IV PUSH (FOR BLOOD PRESSURE SUPPORT)
PREFILLED_SYRINGE | INTRAVENOUS | Status: DC | PRN
  Administered 2021-05-22: 40 ug via INTRAVENOUS
  Administered 2021-05-22: 80 ug via INTRAVENOUS
  Administered 2021-05-22: 40 ug via INTRAVENOUS

## 2021-05-22 MED ORDER — ASPIRIN 81 MG PO CHEW: 324.0000 mg | CHEWABLE_TABLET | Freq: Every day | ORAL | Status: DC

## 2021-05-22 MED ORDER — ACETAMINOPHEN 160 MG/5ML PO SOLN
1000.0000 mg | Freq: Four times a day (QID) | ORAL | Status: AC
Start: 2021-05-23 — End: 2021-05-27

## 2021-05-22 MED ORDER — FAMOTIDINE IN NACL 20-0.9 MG/50ML-% IV SOLN
20.0000 mg | Freq: Two times a day (BID) | INTRAVENOUS | Status: DC
  Administered 2021-05-22: 20 mg via INTRAVENOUS
  Filled 2021-05-22: qty 50

## 2021-05-22 MED ORDER — VANCOMYCIN HCL IN DEXTROSE 1-5 GM/200ML-% IV SOLN
1000.0000 mg | Freq: Once | INTRAVENOUS | Status: AC
  Administered 2021-05-22: 1000 mg via INTRAVENOUS
  Filled 2021-05-22: qty 200

## 2021-05-22 MED ORDER — LIDOCAINE 2% (20 MG/ML) 5 ML SYRINGE
INTRAMUSCULAR | Status: AC
  Filled 2021-05-22: qty 5

## 2021-05-22 MED ORDER — METOPROLOL TARTRATE 25 MG/10 ML ORAL SUSPENSION
12.5000 mg | Freq: Two times a day (BID) | ORAL | Status: DC
Start: 2021-05-22 — End: 2021-05-24

## 2021-05-22 MED ORDER — ALBUMIN HUMAN 5 % IV SOLN: 250.0000 mL | INTRAVENOUS | Status: DC | PRN

## 2021-05-22 MED ORDER — BISACODYL 5 MG PO TBEC
10.0000 mg | DELAYED_RELEASE_TABLET | Freq: Every day | ORAL | Status: DC
  Filled 2021-05-22 (×5): qty 2

## 2021-05-22 MED ORDER — PLASMA-LYTE A IV SOLN
INTRAVENOUS | Status: DC | PRN
  Administered 2021-05-22: 1000 mL

## 2021-05-22 MED ORDER — DOBUTAMINE IN D5W 4-5 MG/ML-% IV SOLN: 0.0000 ug/kg/min | INTRAVENOUS | Status: DC

## 2021-05-22 MED ORDER — NOREPINEPHRINE 4 MG/250ML-% IV SOLN
0.0000 ug/min | INTRAVENOUS | Status: DC
  Administered 2021-05-23: 4 ug/min via INTRAVENOUS
  Filled 2021-05-22: qty 250

## 2021-05-22 MED ORDER — ~~LOC~~ CARDIAC SURGERY, PATIENT & FAMILY EDUCATION
Freq: Once | Status: DC
  Filled 2021-05-22: qty 1

## 2021-05-22 MED ORDER — TRAMADOL HCL 50 MG PO TABS
50.0000 mg | ORAL_TABLET | ORAL | Status: DC | PRN
  Administered 2021-05-22 – 2021-05-29 (×22): 100 mg via ORAL
  Filled 2021-05-22 (×22): qty 2

## 2021-05-22 MED ORDER — ACETAMINOPHEN 160 MG/5ML PO SOLN: 650.0000 mg | Freq: Once | ORAL | Status: AC

## 2021-05-22 MED ORDER — POTASSIUM CHLORIDE 10 MEQ/50ML IV SOLN: 10.0000 meq | INTRAVENOUS | Status: AC

## 2021-05-22 MED ORDER — PROTAMINE SULFATE 10 MG/ML IV SOLN
INTRAVENOUS | Status: DC | PRN
  Administered 2021-05-22: 30 mg via INTRAVENOUS
  Administered 2021-05-22: 320 mg via INTRAVENOUS

## 2021-05-22 MED ORDER — ASPIRIN EC 325 MG PO TBEC
325.0000 mg | DELAYED_RELEASE_TABLET | Freq: Every day | ORAL | Status: DC
  Administered 2021-05-23 – 2021-05-29 (×7): 325 mg via ORAL
  Filled 2021-05-22 (×7): qty 1

## 2021-05-22 MED ORDER — SODIUM CHLORIDE 0.9 % IV SOLN: INTRAVENOUS | Status: DC

## 2021-05-22 MED ORDER — CHLORHEXIDINE GLUCONATE 0.12% ORAL RINSE (MEDLINE KIT): 15.0000 mL | Freq: Two times a day (BID) | OROMUCOSAL | Status: DC

## 2021-05-22 MED ORDER — CHLORHEXIDINE GLUCONATE 0.12 % MT SOLN
15.0000 mL | OROMUCOSAL | Status: AC
  Administered 2021-05-22: 15 mL via OROMUCOSAL

## 2021-05-22 MED ORDER — SODIUM CHLORIDE 0.45 % IV SOLN: INTRAVENOUS | Status: DC | PRN

## 2021-05-22 MED ORDER — MIDAZOLAM HCL 5 MG/5ML IJ SOLN
INTRAMUSCULAR | Status: DC | PRN
  Administered 2021-05-22: 1 mg via INTRAVENOUS
  Administered 2021-05-22 (×3): 2 mg via INTRAVENOUS
  Administered 2021-05-22: 1 mg via INTRAVENOUS
  Administered 2021-05-22: 2 mg via INTRAVENOUS
  Administered 2021-05-22: 4 mg via INTRAVENOUS

## 2021-05-22 MED ORDER — CHLORHEXIDINE GLUCONATE 4 % EX LIQD: 30.0000 mL | CUTANEOUS | Status: DC

## 2021-05-22 SURGICAL SUPPLY — 107 items
APPLIER CLIP 9.375 SM OPEN (CLIP)
BAG DECANTER FOR FLEXI CONT (MISCELLANEOUS) ×6 IMPLANT
BLADE CLIPPER SURG (BLADE) ×12 IMPLANT
BLADE STERNUM SYSTEM 6 (BLADE) ×6 IMPLANT
BLADE SURG 15 STRL LF DISP TIS (BLADE) ×5 IMPLANT
BLADE SURG 15 STRL SS (BLADE) ×1
BLOOD HAEMOCONCENTR 700 MIDI (MISCELLANEOUS) ×6 IMPLANT
BNDG ELASTIC 4X5.8 VLCR STR LF (GAUZE/BANDAGES/DRESSINGS) ×6 IMPLANT
BNDG ELASTIC 6X5.8 VLCR STR LF (GAUZE/BANDAGES/DRESSINGS) ×6 IMPLANT
BNDG GAUZE ELAST 4 BULKY (GAUZE/BANDAGES/DRESSINGS) ×6 IMPLANT
CABLE SURGICAL S-101-97-12 (CABLE) ×6 IMPLANT
CANISTER SUCT 3000ML PPV (MISCELLANEOUS) ×6 IMPLANT
CANNULA MC2 2 STG 29/37 NON-V (CANNULA) ×5 IMPLANT
CANNULA MC2 2 STG 32/40 NON-V (CANNULA) ×5 IMPLANT
CANNULA MC2 TWO STAGE (CANNULA) ×1
CANNULA NON VENT 20FR 12 (CANNULA) IMPLANT
CANNULA NON VENT 22FR 12 (CANNULA) ×6 IMPLANT
CANNULA VENOUS 2 STG 32/40 (CANNULA) ×1
CATH ROBINSON RED A/P 18FR (CATHETERS) ×12 IMPLANT
CLIP APPLIE 9.375 SM OPEN (CLIP) IMPLANT
CLIP RETRACTION 3.0MM CORONARY (MISCELLANEOUS) IMPLANT
CLIP VESOCCLUDE MED 24/CT (CLIP) IMPLANT
CLIP VESOCCLUDE SM WIDE 24/CT (CLIP) IMPLANT
CONN ST 1/2X1/2  BEN (MISCELLANEOUS) ×1
CONN ST 1/2X1/2 BEN (MISCELLANEOUS) ×5 IMPLANT
CONNECTOR BLAKE 2:1 CARIO BLK (MISCELLANEOUS) ×6 IMPLANT
CONTAINER PROTECT SURGISLUSH (MISCELLANEOUS) ×6 IMPLANT
COVER MAYO STAND STRL (DRAPES) IMPLANT
CUFF TOURN SGL QUICK 18X4 (TOURNIQUET CUFF) IMPLANT
CUFF TOURN SGL QUICK 24 (TOURNIQUET CUFF)
CUFF TRNQT CYL 24X4X16.5-23 (TOURNIQUET CUFF) IMPLANT
DERMABOND ADVANCED (GAUZE/BANDAGES/DRESSINGS) ×3
DERMABOND ADVANCED .7 DNX12 (GAUZE/BANDAGES/DRESSINGS) ×15 IMPLANT
DRAIN CHANNEL 19F RND (DRAIN) ×18 IMPLANT
DRAIN CONNECTOR BLAKE 1:1 (MISCELLANEOUS) ×6 IMPLANT
DRAPE CARDIOVASCULAR INCISE (DRAPES) ×1
DRAPE EXTREMITY T 121X128X90 (DISPOSABLE) IMPLANT
DRAPE HALF SHEET 40X57 (DRAPES) ×6 IMPLANT
DRAPE INCISE IOBAN 66X45 STRL (DRAPES) IMPLANT
DRAPE SRG 135X102X78XABS (DRAPES) ×5 IMPLANT
DRAPE WARM FLUID 44X44 (DRAPES) ×6 IMPLANT
DRSG AQUACEL AG ADV 3.5X10 (GAUZE/BANDAGES/DRESSINGS) ×6 IMPLANT
DRSG AQUACEL AG ADV 3.5X14 (GAUZE/BANDAGES/DRESSINGS) ×6 IMPLANT
DRSG COVADERM 4X14 (GAUZE/BANDAGES/DRESSINGS) ×6 IMPLANT
ELECT BLADE 4.0 EZ CLEAN MEGAD (MISCELLANEOUS) ×6
ELECT REM PT RETURN 9FT ADLT (ELECTROSURGICAL) ×12
ELECTRODE BLDE 4.0 EZ CLN MEGD (MISCELLANEOUS) ×5 IMPLANT
ELECTRODE REM PT RTRN 9FT ADLT (ELECTROSURGICAL) ×10 IMPLANT
FELT TEFLON 1X6 (MISCELLANEOUS) ×6 IMPLANT
GAUZE 4X4 16PLY ~~LOC~~+RFID DBL (SPONGE) ×6 IMPLANT
GAUZE SPONGE 4X4 12PLY STRL (GAUZE/BANDAGES/DRESSINGS) ×12 IMPLANT
GAUZE SPONGE 4X4 12PLY STRL LF (GAUZE/BANDAGES/DRESSINGS) ×12 IMPLANT
GEL ULTRASOUND 20GR AQUASONIC (MISCELLANEOUS) IMPLANT
GLOVE SURG ENC MOIS LTX SZ7 (GLOVE) ×12 IMPLANT
GLOVE SURG ENC TEXT LTX SZ7.5 (GLOVE) ×12 IMPLANT
GLOVE SURG MICRO LTX SZ6 (GLOVE) ×24 IMPLANT
GLOVE SURG MICRO LTX SZ6.5 (GLOVE) ×6 IMPLANT
GLOVE SURG MICRO LTX SZ7.5 (GLOVE) ×12 IMPLANT
GOWN STRL REUS W/ TWL LRG LVL3 (GOWN DISPOSABLE) ×50 IMPLANT
GOWN STRL REUS W/ TWL XL LVL3 (GOWN DISPOSABLE) ×10 IMPLANT
GOWN STRL REUS W/TWL LRG LVL3 (GOWN DISPOSABLE) ×10
GOWN STRL REUS W/TWL XL LVL3 (GOWN DISPOSABLE) ×2
HEMOSTAT POWDER SURGIFOAM 1G (HEMOSTASIS) ×12 IMPLANT
INSERT SUTURE HOLDER (MISCELLANEOUS) ×6 IMPLANT
KIT BASIN OR (CUSTOM PROCEDURE TRAY) ×6 IMPLANT
KIT SUCTION CATH 14FR (SUCTIONS) ×6 IMPLANT
KIT TURNOVER KIT B (KITS) ×12 IMPLANT
KIT VASOVIEW HEMOPRO 2 VH 4000 (KITS) ×6 IMPLANT
LEAD PACING MYOCARDI (MISCELLANEOUS) ×6 IMPLANT
MARKER GRAFT CORONARY BYPASS (MISCELLANEOUS) ×18 IMPLANT
NS IRRIG 1000ML POUR BTL (IV SOLUTION) ×30 IMPLANT
PACK ACCESSORY CANNULA KIT (KITS) ×6 IMPLANT
PACK E OPEN HEART (SUTURE) ×6 IMPLANT
PACK OPEN HEART (CUSTOM PROCEDURE TRAY) ×6 IMPLANT
PAD ARMBOARD 7.5X6 YLW CONV (MISCELLANEOUS) ×24 IMPLANT
PAD ELECT DEFIB RADIOL ZOLL (MISCELLANEOUS) ×6 IMPLANT
PENCIL BUTTON HOLSTER BLD 10FT (ELECTRODE) ×6 IMPLANT
POSITIONER HEAD DONUT 9IN (MISCELLANEOUS) ×6 IMPLANT
PUNCH AORTIC ROTATE 4.0MM (MISCELLANEOUS) ×6 IMPLANT
SET MPS 3-ND DEL (MISCELLANEOUS) ×6 IMPLANT
SHEARS HARMONIC 9CM CVD (BLADE) IMPLANT
SPONGE T-LAP 18X18 ~~LOC~~+RFID (SPONGE) ×24 IMPLANT
STOCKINETTE IMPERVIOUS LG (DRAPES) ×6 IMPLANT
SUPPORT HEART JANKE-BARRON (MISCELLANEOUS) ×6 IMPLANT
SUT BONE WAX W31G (SUTURE) ×6 IMPLANT
SUT ETHIBOND X763 2 0 SH 1 (SUTURE) ×12 IMPLANT
SUT MNCRL AB 3-0 PS2 18 (SUTURE) ×12 IMPLANT
SUT PDS AB 1 CTX 36 (SUTURE) ×12 IMPLANT
SUT PROLENE 4 0 SH DA (SUTURE) ×6 IMPLANT
SUT PROLENE 5 0 C 1 36 (SUTURE) ×18 IMPLANT
SUT PROLENE 7 0 BV1 MDA (SUTURE) ×12 IMPLANT
SUT STEEL 6MS V (SUTURE) ×12 IMPLANT
SUT VIC AB 2-0 CT1 27 (SUTURE) ×1
SUT VIC AB 2-0 CT1 TAPERPNT 27 (SUTURE) ×5 IMPLANT
SUT VIC AB 3-0 SH 27 (SUTURE)
SUT VIC AB 3-0 SH 27X BRD (SUTURE) IMPLANT
SUT VIC AB 3-0 X1 27 (SUTURE) ×6 IMPLANT
SYR 50ML SLIP (SYRINGE) IMPLANT
SYSTEM SAHARA CHEST DRAIN ATS (WOUND CARE) ×6 IMPLANT
TAPE CLOTH SURG 4X10 WHT LF (GAUZE/BANDAGES/DRESSINGS) ×12 IMPLANT
TAPE PAPER 2X10 WHT MICROPORE (GAUZE/BANDAGES/DRESSINGS) ×6 IMPLANT
TOWEL GREEN STERILE (TOWEL DISPOSABLE) ×12 IMPLANT
TOWEL GREEN STERILE FF (TOWEL DISPOSABLE) ×12 IMPLANT
TRAY FOLEY SLVR 16FR TEMP STAT (SET/KITS/TRAYS/PACK) ×6 IMPLANT
TUBING LAP HI FLOW INSUFFLATIO (TUBING) ×6 IMPLANT
UNDERPAD 30X36 HEAVY ABSORB (UNDERPADS AND DIAPERS) ×12 IMPLANT
WATER STERILE IRR 1000ML POUR (IV SOLUTION) ×12 IMPLANT

## 2021-05-22 NOTE — Interval H&P Note (Signed)
History and Physical Interval Note:  05/22/2021 6:53 AM  Adam James  has presented today for surgery, with the diagnosis of CAD CHF.  The various methods of treatment have been discussed with the patient and family. After consideration of risks, benefits and other options for treatment, the patient has consented to  Procedure(s): CORONARY ARTERY BYPASS GRAFTING (CABG) (N/A) RADIAL ARTERY HARVEST (Right) TRANSESOPHAGEAL ECHOCARDIOGRAM (TEE) (N/A) as a surgical intervention.  The patient's history has been reviewed, patient examined, no change in status, stable for surgery.  I have reviewed the patient's chart and labs.  Questions were answered to the patient's satisfaction.     Correction. We will not harvest the right radial artery.  We will only harvest saphenous vein and the LIMA  Jhoselin Crume O Crosley Stejskal

## 2021-05-22 NOTE — Anesthesia Procedure Notes (Signed)
Arterial Line Insertion Start/End10/27/2022 6:55 AM, 05/22/2021 7:05 AM Performed by: Arville Lime, CRNA, CRNA  Patient location: Pre-op. Preanesthetic checklist: patient identified, IV checked, site marked, risks and benefits discussed, surgical consent, monitors and equipment checked, pre-op evaluation, timeout performed and anesthesia consent Lidocaine 1% used for infiltration Left, radial was placed Catheter size: 20 G Hand hygiene performed  and maximum sterile barriers used   Attempts: 1 Procedure performed without using ultrasound guided technique. Following insertion, dressing applied and Biopatch. Post procedure assessment: normal and unchanged

## 2021-05-22 NOTE — Hospital Course (Addendum)
History of Present Illness:     The patient is a 50 year old male who was referred to Dr. Cliffton Asters for consideration of coronary artery bypass grafting.  The patient has multiple cardiac risk factors and comorbidities including congestive heart failure with cardiomyopathy, type 2 diabetes with known chronic left diabetic foot ulcer, dyslipidemia as well as previous history of myocardial infarction.  His original surgery was postponed due to the infection in the lower extremity.  This has been showing steady healing and he was felt to be ready to proceed with surgical intervention and was admitted this hospitalization for the procedure.  It is noted he did undergo a cardiac MRI previously which showed good viability.  He also underwent a repeat left heart catheterization which redemonstrated the previous findings.  Hospital Course:  The patient was admitted on 05/22/2021 and taken to the operating room at which time he underwent coronary artery bypass grafting x3.  He tolerated the procedure well and was taken to the ICU in stable condition. He was extubated the evening of surgery without difficulty. Theone Murdoch, a line, and foley were removed early in his post operative course. Chest tubes remained for a few days and once output decreased, were removed. He was on Amiodarone drip (for VT in OR) and Nor Epinephrine drip. He has a history of CKD (stage III).  He developed an elevation in his creatinine level to 2.29.  Heart failure assisted with post op management. He had his 5th ray amputated (had necrotizing fascitis) on 10/05 and is still on antibiotics. He has a history of erosive esophagitis so is on Protonix. He was weaned off the Insulin drip. His pre op HGA1C was 6.5. He will be restarted on Glyburide and Metformin. Epicardial pacing wires were removed without difficulty.  He was able to be weaned off drips,  however his Co ox was low and Milrinone was initiated by the AHF team on 05/25/2021.   He was  started on Spironolactone and Digoxin. He was diuresed for volume overload state.    His creatinine did continue to decrease. It was down to 1.63 on 11/01.  Most recent creatinine dated 11/03 was decreased to 1.38. Milrinone drip was stopped 11/01.  On 05/28/2021 his IV Lasix was discontinued and he was transitioned to oral torsemide.  He is additionally continued on his spironolactone and digoxin.  Losartan was added with plans to transition to [facility] on 11/03.  His ventricular ectopy has stopped and continues on oral amiodarone for this.  He does have an expected acute blood loss anemia which did require transfusion during the postoperative period but has stabilized. He was surgically stable for discharge and heart failure in agreement on 11/03. Unfortunately, because of no insurance he was not able to be discharged to SNF. Rolling walker was arranged.

## 2021-05-22 NOTE — Anesthesia Procedure Notes (Signed)
Central Venous Catheter Insertion Performed by: Nolon Nations, MD, anesthesiologist Start/End10/27/2022 6:45 AM, 05/22/2021 7:05 AM Patient location: Pre-op. Preanesthetic checklist: patient identified, IV checked, site marked, risks and benefits discussed, surgical consent, monitors and equipment checked, pre-op evaluation, timeout performed and anesthesia consent Position: Trendelenburg Lidocaine 1% used for infiltration and patient sedated Hand hygiene performed  and maximum sterile barriers used  Catheter size: 9 Fr MAC introducer Swan type:thermodilution PA Cath depth:48 Procedure performed using ultrasound guided technique. Ultrasound Notes:anatomy identified, needle tip was noted to be adjacent to the nerve/plexus identified, no ultrasound evidence of intravascular and/or intraneural injection and image(s) printed for medical record Attempts: 1 Following insertion, line sutured, dressing applied and Biopatch. Post procedure assessment: blood return through all ports, free fluid flow and no air  Patient tolerated the procedure well with no immediate complications.

## 2021-05-22 NOTE — Procedures (Signed)
Extubation Procedure Note  Patient Details:   Name: Monique Gift DOB: 10/03/1970 MRN: [id number]   Airway Documentation:    Vent end date: 05/22/21 Vent end time: 1736   Evaluation  O2 sats: stable throughout Complications: No apparent complications Patient did tolerate procedure well. Bilateral Breath Sounds: Clear, Diminished   Yes  Audible cuff leak was heard prior extubation. NIF -25 and VC 1125. Pt is currently is talking on 4L [place] and is currently stable. RT will continue to monitor.  Jolayne Panther 05/22/2021, 5:38 PM

## 2021-05-22 NOTE — Transfer of Care (Signed)
Immediate Anesthesia Transfer of Care Note  Patient: Adam James  Procedure(s) Performed: CORONARY ARTERY BYPASS GRAFTING (CABG) X 3 USING LEFT INTERNAL MAMMARY ARTERY AND RIGHT ENDOSCOPIC GREATER SAPHENOUS VEIN CONDUITS (Chest) TRANSESOPHAGEAL ECHOCARDIOGRAM (TEE) APPLICATION OF CELL SAVER ENDOVEIN HARVEST OF GREATER SAPHENOUS VEIN (Right)  Patient Location: ICU  Anesthesia Type:General  Level of Consciousness: Patient remains intubated per anesthesia plan  Airway & Oxygen Therapy: Patient remains intubated per anesthesia plan and Patient placed on Ventilator (see vital sign flow sheet for setting)  Post-op Assessment: Report given to RN and Post -op Vital signs reviewed and stable  Post vital signs: Reviewed and stable  Last Vitals:  Vitals Value Taken Time  BP 122/64 (80)   Temp 37 C 05/22/21 1302  Pulse 84 05/22/21 1302  Resp 12 05/22/21 1302  SpO2 100 % 05/22/21 1302  Vitals shown include unvalidated device data.  Last Pain:  Vitals:   05/22/21 0613  TempSrc:   PainSc: 7       Patients Stated Pain Goal: 2 (05/22/21 3614)  Complications: No notable events documented.

## 2021-05-22 NOTE — Brief Op Note (Signed)
05/22/2021  12:53 PM  PATIENT:  Adam James  50 y.o. male  PRE-OPERATIVE DIAGNOSIS:  CORONARY ARTERY DISEASE, CONGESTIVE HEART FAILURE  POST-OPERATIVE DIAGNOSIS:  CORONARY ARTERY DISEASE, CONGESTIVE HEART FAILURE  PROCEDURE:  Procedure(s): CORONARY ARTERY BYPASS GRAFTING (CABG) X 3 USING LEFT INTERNAL MAMMARY ARTERY AND RIGHT ENDOSCOPIC GREATER SAPHENOUS VEIN CONDUITS (N/A) TRANSESOPHAGEAL ECHOCARDIOGRAM (TEE) (N/A) APPLICATION OF CELL SAVER ENDOVEIN HARVEST OF GREATER SAPHENOUS VEIN (Right) LIMA-LAD SVG-OM3 SVG-OM EVH 60 MIN  SURGEON:  Surgeon(s) and Role:    * Lightfoot, Eliezer Lofts, MD - Primary  PHYSICIAN ASSISTANT: Kodie Pick PA-C  ASSISTANTS: SRAFF   ANESTHESIA:   general  EBL:  500 mL   BLOOD ADMINISTERED:none  DRAINS:  3 DRAINS IN  LEFT PLEURA AND MEDIASTINUM    LOCAL MEDICATIONS USED:  NONE  SPECIMEN:  No Specimen  DISPOSITION OF SPECIMEN:  N/A  COUNTS:  YES  TOURNIQUET:  * No tourniquets in log *  DICTATION: .Dragon Dictation  PLAN OF CARE: Admit to inpatient   PATIENT DISPOSITION:  ICU - intubated and hemodynamically stable.   Delay start of Pharmacological VTE agent (>24hrs) due to surgical blood loss or risk of bleeding: yes  COMPLICATIONS: NO KNOWN

## 2021-05-22 NOTE — Anesthesia Procedure Notes (Signed)
Procedure Name: Intubation Date/Time: 05/22/2021 7:50 AM Performed by: Ardyth Harps, CRNA Pre-anesthesia Checklist: Patient identified, Emergency Drugs available, Suction available and Patient being monitored Patient Re-evaluated:Patient Re-evaluated prior to induction Oxygen Delivery Method: Circle System Utilized Preoxygenation: Pre-oxygenation with 100% oxygen Induction Type: IV induction Ventilation: Mask ventilation without difficulty Laryngoscope Size: Mac and 4 Grade View: Grade I Tube type: Oral Number of attempts: 1 Airway Equipment and Method: Stylet and Oral airway Placement Confirmation: ETT inserted through vocal cords under direct vision, positive ETCO2 and breath sounds checked- equal and bilateral Secured at: 24 cm Tube secured with: Tape Dental Injury: Teeth and Oropharynx as per pre-operative assessment

## 2021-05-22 NOTE — Anesthesia Procedure Notes (Signed)
Central Venous Catheter Insertion Performed by: Lewie Loron, MD, anesthesiologist Start/End10/27/2022 7:05 AM, 05/22/2021 7:15 AM Patient location: Pre-op. Preanesthetic checklist: patient identified, IV checked, site marked, risks and benefits discussed, surgical consent, monitors and equipment checked, pre-op evaluation, timeout performed and anesthesia consent Hand hygiene performed  and maximum sterile barriers used  PA cath was placed.Swan type:thermodilution PA Cath depth:48 Procedure performed without using ultrasound guided technique. Attempts: 1 Patient tolerated the procedure well with no immediate complications.

## 2021-05-22 NOTE — Op Note (Signed)
[address]       [place] James [id number]             [id number]                                          05/22/2021 Patient:  Adam James Pre-Op Dx: 3V CAD CHF with EF of 25% DM    Post-op Dx:  same Procedure: CABG X 3.  LIMA LAD, RSVG OM1 and OM3   Endoscopic greater saphenous vein harvest on the right   Surgeon and Role:      * Keighan Amezcua, Eliezer Lofts, MD - Primary    Webb Laws, PA-C - assisting for vein harvest  Anesthesia  general EBL:  Blood Administration: 2 units pRBCs Xclamp Time:  56 min Pump Time:   Drains: 41 F blake drain:  R, L, mediastinal  Wires: ventricular Counts: correct   Indications: 50 year old male with severe three-vessel coronary artery disease, and congestive heart failure with an EF of 25%, and mildly reduced right ventricular heart function.  There is no significant valvular disease.  On review of his left heart cath, he does not have a good target in his LAD.  All of his other vessels are somewhat small but would be amenable for surgical revascularization.  He was previously seen back and February of this year with mostly heart failure symptoms.  He did undergo a cardiac MRI and April which did show good viability.  He has been admitted to the hospital since then for management of heart failure exacerbation, and he also has a large wound on his foot that was being treated.  I previously had plan to perform surgical revascularization to his ramus branch with LIMA, and vein to the PDA, LAD, and obtuse marginal branches.  Findings: Varicosed being.  Good LIMA.  PDA was too small for bypass.  OM3 was a better target.  LAD was heavily calcified.  OM1 and Ramus covered overlapping territories.  OM1 was a better target.  Operative Technique: All invasive lines were placed in pre-op holding.  After the risks, benefits and alternatives were thoroughly discussed, the patient was brought to the operative theatre.   Anesthesia was induced, and the patient was prepped and draped in normal sterile fashion.  An appropriate surgical pause was performed, and pre-operative antibiotics were dosed accordingly.  We began with simultaneous incisions along the right leg for harvesting of the greater saphenous vein and the chest for the sternotomy.  In regards to the sternotomy, this was carried down with bovie cautery, and the sternum was divided with a reciprocating saw.  Meticulous hemostasis was obtained.  The left internal thoracic artery was exposed and harvested in in pedicled fashion.  The patient was systemically heparinized, and the artery was divided distally, and placed in a papaverine sponge.    The sternal elevator was removed, and a retractor was placed.  The pericardium was divided in the midline and fashioned into a cradle with pericardial stitches.   After we confirmed an appropriate ACT, the ascending aorta was cannulated in standard fashion.  The right atrial appendage was used for venous cannulation site.  Cardiopulmonary bypass was initiated, and the heart retractor was placed. The cross clamp was applied, and a dose of anterograde cardioplegia was given with good arrest of the heart.  We moved to  the posterior wall of the heart, and found a good target on the OM3.  An arteriotomy was made, and the vein graft was anastomosed to it in an end to side fashion.  Next we exposed the lateral wall, and found a good target on the OM1.  An end to side anastomosis with the vein graft was then created.  Finally, we exposed a good target on the LAD, and fashioned an end to side anastomosis between it and the LITA.  We began to re-warm, and a re-animation dose of cardioplegia was given.  The heart was de-aired, and the cross clamp was removed.  Meticulous hemostasis was obtained.    A partial occludding clamp was then placed on the ascending aorta, and we created an end to side anastomosis between it and the proximal vein  grafts.  The proximal sites were marked with rings.  Hemostasis was obtained, and we separated from cardiopulmonary bypass without event.  The heparin was reversed with protamine.  Chest tubes and wires were placed, and the sternum was re-approximated with sternal wires.  The soft tissue and skin were re-approximated wth absorbable suture.    The patient tolerated the procedure without any immediate complications, and was transferred to the ICU in guarded condition.  Adam James

## 2021-05-22 NOTE — Consult Note (Addendum)
Advanced Heart Failure Team Consult Note   Primary Physician: Pcp, No PCP-Cardiologist:  Norman Herrlich, MD HF MD: Dr Shirlee Latch  Ortho: Dr Lajoyce Corners.   Reason for Consultation: Heart Failure   HPI:    Ramelo Oetken is seen today for evaluation of heart failure  at the request of Dr Cliffton Asters.   Mr Ambler is a 50 year old with history of CAD s/p PCI/stent 2007 in [place]. 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   Had [facility] 08/2020 that showed multivessel CAD. Saw Dr Cliffton Asters in the office with plans for CMRI. He was referred to SW due to financial concerns and no payor source. CMRI showed EF 40% and viable myocardium.     Saw Dr Cliffton Asters in July and was set up for CABG in August. Pre CABG labs completed and showed WBC 25. He was advised to go to the ED. Admitted 02/25/2021 with left foot infection complicated by osteomyelitis and necrotizing fasciitis. Multiple debridements for limb salvage. Hospital course complicated by erosive esophagitis on EGD. Recommendations for protonix 40 mg twice a day + repeat EGD in 8 week. .Received multiple transfusions. Discharged 03/15/21   Admitted in 03/23/2021 with a/c CHF. RHC 8/22 with preserved CO, mildly elevated PCWP, mild pulmonary hypertension.  Diuresed with IV lasix. Discharged 03/29/2021.     Seen in HF clinic 09/30. Had not been taking Torsemide after discharge, had been using an old supply of furosemide. Weight 122 Kg, had been 113 kg at discharge.  Torsemide restarted at 40 mg BID X 3 days then 40 mg daily.   Admitted 04/28/2021 with A/C HFeEF and left foot infection. 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 Diuresed with IV lasix and was on IV antibiotics. GDMT adjusted.   Admitted today for scheduled CABG. CABG x3. Coming off pump had VT. Given lidocaine and shocked x3. Arrived to ICU sedated on vent. On Norepi 5 mcg + amio 30 mg . Has been in SR.   Swan  # PA 30/16 (22)  CO 4.7 CI 2.   Cardiac Testing Echo 03/24/21 EF 25%  Grade II DD RV mildly reduced.  Echo 01/2021-EF 30-35% Grade I DD RV normal  Echo 08/2020- EF 40-45% Grade 1DD RV normal    CMRI 10/2020 -EF40%. Viable myocardium.    LHC 08/2020  Mid LM to Dist LM lesion is 50% stenosed. Ramus lesion is 70% stenosed. 1st Mrg lesion is 50% stenosed. Prox Cx lesion is 70% stenosed. Prox RCA to Mid RCA lesion is 100% stenosed. Dist RCA lesion is 100% stenosed. Ost LAD to Mid LAD lesion is 90% stenosed. Dist LAD lesion is 100% stenosed. Mid LAD to Dist LAD lesion is 90% stenosed.  Severe multivessel coronary obstructive disease with smooth 50% distal left main stenosis prior to trifurcating into the LAD, ramus intermediate, and left circumflex vessel.      Review of Systems: [y] = yes, [ ]  = no  Patient is encephalopathic and or intubated. Therefore history has been obtained from chart review.    General: Weight gain [ ] ; Weight loss [ ] ; Anorexia [ ] ; Fatigue [ ] ; Fever [ ] ; Chills [ ] ; Weakness [ Y]  Cardiac: Chest pain/pressure [ ] ; Resting SOB [ ] ; Exertional SOB [ ] ; Orthopnea [ ] ; Pedal Edema [ ] ; Palpitations [ ] ; Syncope [ ] ; Presyncope [ ] ; Paroxysmal nocturnal dyspnea[ ]   Pulmonary: Cough [ ] ; Wheezing[ ] ; Hemoptysis[ ] ; Sputum [ ] ;  Snoring   GI: Vomiting[ ] ; Dysphagia[ ] ; Melena[ ] ; Hematochezia ; Heartburn[ ] ; Abdominal pain ; Constipation ; Diarrhea ; BRBPR   GU: Hematuria[ ] ; Dysuria ; Nocturia[ ]   Vascular: Pain in legs with walking [Y ]; Pain in feet with lying flat ; Non-healing sores ; Stroke ; TIA ; Slurred speech ;  Neuro: Headaches[ ] ; Vertigo[ ] ; Seizures[ ] ; Paresthesias[ ] ;Blurred vision ; Diplopia ; Vision changes   Ortho/Skin: Arthritis ; Joint pain [Y ]; Muscle pain ; Joint swelling ; Back Pain ; Rash   Psych: Depression[ Y]; Anxiety[ ]   Heme: Bleeding problems ; Clotting disorders ; Anemia    Endocrine: Diabetes [ Y]; Thyroid dysfunction[ ]   Home Medications Prior to Admission medications   Medication Sig Start Date End Date Taking? Authorizing Provider  aspirin 81 MG EC tablet Take 1 tablet (81 mg total) by mouth daily. 05/07/21  Yes Verdene Lennert, MD  atorvastatin (LIPITOR) 80 MG tablet Take 1 tablet (80 mg total) by mouth daily. 05/07/21  Yes Verdene Lennert, MD  glucose blood (TRUE METRIX BLOOD GLUCOSE TEST) test strip Use as instructed twice daily 05/12/21  Yes Mayers, Cari S, PA-C  glyBURIDE (DIABETA) 5 MG tablet Take 1 tablet (5 mg total) by mouth 2 (two) times daily. 05/12/21  Yes Mayers, Cari S, PA-C  metFORMIN (GLUCOPHAGE) 500 MG tablet Take 1 tablet (500 mg total) by mouth 2 (two) times daily. 05/12/21  Yes Mayers, Cari S, PA-C  metoprolol succinate (TOPROL-XL) 25 MG 24 hr tablet Take 1 tablet (25 mg total) by mouth every evening. 05/07/21 06/06/21 Yes Princess Bruins, DO  mexiletine (MEXITIL) 200 MG capsule Take 1 capsule (200 mg total) by mouth 2 (two) times daily. 05/07/21 06/06/21 Yes Princess Bruins, DO  potassium chloride SA (KLOR-CON) 20 MEQ tablet Take 1 tablet (20 mEq total) by mouth daily. 05/07/21  Yes Verdene Lennert, MD  sacubitril-valsartan (ENTRESTO) 24-26 MG Take 1 tablet by mouth 2 (two) times daily. 05/07/21 06/06/21 Yes Princess Bruins, DO  spironolactone (ALDACTONE) 25 MG tablet Take 1 tablet (25 mg total) by mouth daily. 05/07/21 06/06/21 Yes Laurey Morale, MD  torsemide (DEMADEX) 20 MG tablet Take 2 tablets (40 mg total) by mouth daily. 05/08/21 06/07/21 Yes Princess Bruins, DO  TRUEplus Lancets 28G MISC Use as directed twice daily at 8 am and 10 pm. 05/12/21  Yes Mayers, Kasandra Knudsen, PA-C    Past Medical History: Past Medical History:  Diagnosis Date   Anemia    GI bleed; 03/13/11 EGD: relux esophagitis with ulceration/clot   CAD (coronary artery disease)    Cardiomyopathy (HCC)    CHF (congestive heart failure) (HCC)    CKD (chronic kidney disease)     Diabetic foot ulcer (HCC)    Dyslipidemia    Heart failure (HCC)    Myocardial infarction (HCC)    Type 2 diabetes mellitus (HCC)     Past Surgical History: Past Surgical History:  Procedure Laterality Date   AMPUTATION Left 04/30/2021   Procedure: LEFT FOOT 5TH RAY AMPUTATION APPLICATION OF A-CELL POWDER;  Surgeon: [name], MD;  Location: [facility] OR;  Service: Orthopedics;  Laterality: Left;   APPENDECTOMY     APPLICATION OF WOUND VAC Left 03/03/2021   Procedure: APPLICATION OF WOUND VAC;  Surgeon: Tarry Kos, MD;  Location: [facility] OR;  Service: Orthopedics;  Laterality: Left;  APPLICATION OF WOUND VAC  03/07/2021   Procedure: APPLICATION OF WOUND VAC;  Surgeon: [name], MD;  Location: [facility] OR;  Service: Orthopedics;;   CARDIAC CATHETERIZATION     COLON SURGERY     ESOPHAGOGASTRODUODENOSCOPY (EGD) WITH PROPOFOL N/A 03/12/2021   Procedure: ESOPHAGOGASTRODUODENOSCOPY (EGD) WITH PROPOFOL;  Surgeon: Jenel Lucks, MD;  Location: [facility] ENDOSCOPY;  Service: Gastroenterology;  Laterality: N/A;   I & D EXTREMITY Left 02/24/2021   Procedure: IRRIGATION AND DEBRIDEMENT EXTREMITY;  Surgeon: Myrene Galas, MD;  Location[facility] OR;  Service: Orthopedics;  Laterality: Left;   I & D EXTREMITY Left 02/26/2021   Procedure: IRRIGATION AND DEBRIDEMENT OF LEG;  Surgeon: [name], MD;  Location: [facility] OR;  Service: Orthopedics;  Laterality: Left;   I & D EXTREMITY Left 03/03/2021   Procedure: LEFT LEG IRRIGATION AND DEBRIDEMENT;  Surgeon: Tarry Kos, MD;  Location: [facility] OR;  Service: Orthopedics;  Laterality: Left;   I & D EXTREMITY Left 03/07/2021   Procedure: REPEAT DEBRIDEMENT LEFT LEG;  Surgeon: [name], MD;  Location: [facility] OR;  Service: Orthopedics;  Laterality: Left;   IR FLUORO GUIDE CV LINE RIGHT  03/10/2021   IR REMOVAL TUN CV CATH W/O FL  03/24/2021   IR US GUIDE VASC ACCESS RIGHT  03/10/2021   LEFT HEART CATH AND CORONARY ANGIOGRAPHY N/A 09/13/2020   Procedure: LEFT HEART CATH AND  CORONARY ANGIOGRAPHY;  Surgeon: Lennette Bihari, MD;  Location: [facility] INVASIVE CV LAB;  Service: Cardiovascular;  Laterality: N/A;   LEFT HEART CATH AND CORONARY ANGIOGRAPHY N/A 03/28/2021   Procedure: LEFT HEART CATH AND CORONARY ANGIOGRAPHY;  Surgeon: Laurey Morale, MD;  Location: [facility] INVASIVE CV LAB;  Service: Cardiovascular;  Laterality: N/A;   RIGHT HEART CATH N/A 03/26/2021   Procedure: RIGHT HEART CATH;  Surgeon: Laurey Morale, MD;  Location: [facility] INVASIVE CV LAB;  Service: Cardiovascular;  Laterality: N/A;   TOE AMPUTATION  2020    Family History: Family History  Problem Relation Age of Onset   Hypertension Mother    Heart failure Father    Hypertension Brother    Congestive Heart Failure Brother     Social History: Social History   Socioeconomic History   Marital status: Single    Spouse name: Not on file   Number of children: 1   Years of education: Not on file   Highest education level: 8th grade  Occupational History   Occupation: none    Comment: former "[employer]"  Tobacco Use   Smoking status: Former    Packs/day: 3.00    Years: 36.00    Pack years: 108.00    Types: Cigarettes    Quit date: 08/27/2020    Years since quitting: 0.7   Smokeless tobacco: Never   Tobacco comments:    5-6 daily  Vaping Use   Vaping Use: Never used  Substance and Sexual Activity   Alcohol use: Not Currently    Comment: Very rare   Drug use: Not Currently    Types: Marijuana   Sexual activity: Not on file  Other Topics Concern   Not on file  Social History Narrative   Not on file   Social Determinants of Health   Financial Resource Strain: High Risk   Difficulty of Paying Living Expenses: Very hard  Food Insecurity: No Food Insecurity   Worried About Running Out of Food in the Last Year: Never true   Ran Out of Food in the Last  Year: Never true  Transportation Needs: Unmet Transportation Needs   Lack of Transportation (Medical): Yes   Lack of Transportation (Non-Medical):  Yes  Physical Activity: Not on file  Stress: Not on file  Social Connections: Not on file    Allergies:  Allergies  Allergen Reactions   Morphine Itching    Objective:    Vital Signs:   Temp:  [98.4 F (36.9 C)] 98.4 F (36.9 C) (10/27 0543) Pulse Rate:  [103] 103 (10/27 0543) Resp:  [18] 18 (10/27 0543) BP: (116)/(68) 116/68 (10/27 0543) SpO2:  [100 %] 100 % (10/27 0543) Weight:  [108.9 kg] 108.9 kg (10/27 0543)    Weight change: Filed Weights   05/22/21 0543  Weight: 108.9 kg    Intake/Output:   Intake/Output Summary (Last 24 hours) at 05/22/2021 1259 Last data filed at 05/22/2021 1234 Gross per 24 hour  Intake 2600 ml  Output 1380 ml  Net 1220 ml      Physical Exam    General:  Intubated/sedated  HEENT: normal Neck: supple. JVP difficult to assess . Carotids 2+ bilat; no bruits. No lymphadenopathy or thyromegaly appreciated. RIJ  Cor: PMI nondisplaced. Regular rate & rhythm. No rubs, gallops or murmurs. Pacing wires. MT x3 Lungs: clear Abdomen: soft, nontender, nondistended. No hepatosplenomegaly. No bruits or masses. Good bowel sounds. Extremities: no cyanosis, clubbing, rash, edema. RLE ace wrap. LLE with dressing place.  Neuro: intubated//sedated Telemetry   Sr 80s   EKG  N/A  Labs   Basic Metabolic Panel: Recent Labs  Lab 05/20/21 1000 05/22/21 0806 05/22/21 0925 05/22/21 0954 05/22/21 1022 05/22/21 1037 05/22/21 1104 05/22/21 1148 05/22/21 1152  NA 135   < > 138   < > 139 138 138 139 138  K 4.0   < > 3.7   < > 4.4 4.2 5.9* 5.0 5.0  CL 104  --  101  --   --  102  --   --  103  CO2 21*  --   --   --   --   --   --   --   --   GLUCOSE 167*  --  134*  --   --  142*  --   --  148*  BUN 23*  --  22*  --   --  21*  --   --  21*  CREATININE 1.49*  --  1.40*  --   --  1.30*  --   --  1.40*  CALCIUM 8.3*  --   --   --   --   --   --   --   --    < > = values in this interval not displayed.    Liver Function Tests: Recent Labs  Lab  05/20/21 1000  AST 11*  ALT 11  ALKPHOS 53  BILITOT 0.7  PROT 6.3*  ALBUMIN 2.6*   No results for input(s): LIPASE, AMYLASE in the last 168 hours. No results for input(s): AMMONIA in the last 168 hours.  CBC: Recent Labs  Lab 05/20/21 1000 05/22/21 0806 05/22/21 1037 05/22/21 1104 05/22/21 1106 05/22/21 1148 05/22/21 1152  WBC 9.6  --   --   --   --   --   --   HGB 9.5*   < > 8.2* 8.2* 7.6* 8.2* 7.5*  HCT 29.9*   < > 24.0* 24.0* 22.9* 24.0* 22.0*  MCV 90.9  --   --   --   --   --   --  PLT 273  --   --   --  224  --   --    < > = values in this interval not displayed.    Cardiac Enzymes: No results for input(s): CKTOTAL, CKMB, CKMBINDEX, TROPONINI in the last 168 hours.  BNP: BNP (last 3 results) Recent Labs    03/23/21 0045 04/25/21 1505 04/28/21 1047  BNP 1,129.9* 1,691.7* 1,072.2*    ProBNP (last 3 results) Recent Labs    10/07/20 1004  PROBNP 468*     CBG: Recent Labs  Lab 05/20/21 0914 05/22/21 0548  GLUCAP 151* 137*    Coagulation Studies: Recent Labs    05/20/21 1000  LABPROT 14.4  INR 1.1     Imaging   No results found.   Medications:     Current Medications:  [START ON 05/23/2021] acetaminophen  1,000 mg Oral Q6H   Or   [START ON 05/23/2021] acetaminophen (TYLENOL) oral liquid 160 mg/5 mL  1,000 mg Per Tube Q6H   acetaminophen (TYLENOL) oral liquid 160 mg/5 mL  650 mg Per Tube Once   Or   acetaminophen  650 mg Rectal Once   [START ON 05/23/2021] aspirin EC  325 mg Oral Daily   Or   [START ON 05/23/2021] aspirin  324 mg Per Tube Daily   [START ON 05/23/2021] bisacodyl  10 mg Oral Daily   Or   [START ON 05/23/2021] bisacodyl  10 mg Rectal Daily   chlorhexidine  15 mL Mouth/Throat NOW   [START ON 05/23/2021] docusate sodium  200 mg Oral Daily   metoprolol tartrate  12.5 mg Oral BID   Or   metoprolol tartrate  12.5 mg Per Tube BID   [START ON 05/24/2021] pantoprazole  40 mg Oral Daily   [START ON 05/23/2021] sodium  chloride flush  3 mL Intravenous Q12H    Infusions:  sodium chloride     [START ON 05/23/2021] sodium chloride     sodium chloride     albumin human     amiodarone     amiodarone      ceFAZolin (ANCEF) IV     dexmedetomidine (PRECEDEX) IV infusion     famotidine (PEPCID) IV     insulin     lactated ringers     lactated ringers     lactated ringers     magnesium sulfate     nitroGLYCERIN     norepinephrine (LEVOPHED) Adult infusion     phenylephrine (NEO-SYNEPHRINE) Adult infusion     potassium chloride     vancomycin        Patient Profile  Mr Anctil is a 50 year old with history of CAD s/p PCI/stent 2007 in [place]. DMII, CKD Stage IIIa, HTN, hyperlipidemia, DMII, 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   Admitted for scheduled CABG  Assessment/Plan  S/P CABG x3  - LHC 08/2020-->50% dLM, 70% ramus, 70% pLCx, totally occluded proximal RCA, 90% stenosis ostial to mid LAD, 90% mid LAD, totally occluded distal LAD.  Cardiac MRI in 4/22 showed EF 40% with substantial viable myocardium, only delayed enhancement was subendocardial in the mid to apical inferior wall.   -Currently on Norepi 5 mcg. Swan #s ok. Set up CVP and follow daily CO-OX  - Remains intubated  - Immediate post op care per Dr Cliffton Asters.   2. VT  - H/O frequent PVCs  -Shock x3  in SR now.  -Continue amio drip   3. Chronic HFrEF, ICM  -  Most recent ECHO 02/2021 EF 25% with mild RV dysfunciton  - As above on Norepi at 5 mcg follow CVP and CO-OX - Hold off on GDMT   4. CKD Stage IIIb -Creatinine baseline 1.3-1.5 -Follow renal function closely   5. DMII  6. H/O GI Bleed in the setting of erosive esophagitis  -On PPI  7. 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     Length of Stay: 0  Tonye Becket, NP  05/22/2021, 12:59 PM  Advanced Heart Failure Team Pager [phone] (M-F; 7a - 5p)  Please  contact [facility] for night-coverage after hours (4p -7a ) and weekends on [email or web address]   Agree with above.   50 y/o male with severe CAD, DM2 with recent LLE osteo c/b necrotizing fasciitis and systolic HF due to iCM EF 40%.   Recently admitted LLE infection with multiple debridements for limb salvage. Has recovered well.   Underwent planned CABG x 3 today with Dr. Cliffton Asters. Course c/b VT x 2 coming off pump requiring shock and lidocaine gtt.   Now awake on vent. Hemodynamics stable on NE 5  General:  Awake on vent following commands HEENT: normal + ETT Neck: supple. RIJ swan Carotids 2+ bilat; no bruits. No lymphadenopathy or thryomegaly appreciated. Cor: Sternal dressing and CTs in place. Regular rate & rhythm. No rubs, gallops or murmurs. Lungs: clear Abdomen: soft, nontender, nondistended. No hepatosplenomegaly. No bruits or masses. Hypoactive bowel sounds. Extremities: no cyanosis, clubbing, rash, tr edema LLE wrapped  Neuro: awake on vent following commands  He is immediately post-op. Rhythm now stable after defibrillation for VT. Continue NE for support. Continue vent wean. Will need to follow hemodynamics closely. May need low-dose milrinone.   CRITICAL CARE Performed by: Arvilla Meres  Total critical care time: 45 minutes  Critical care time was exclusive of separately billable procedures and treating other patients.  Critical care was necessary to treat or prevent imminent or life-threatening deterioration.  Critical care was time spent personally by me (independent of midlevel providers or residents) on the following activities: development of treatment plan with patient and/or surrogate as well as nursing, discussions with consultants, evaluation of patient's response to treatment, examination of patient, obtaining history from patient or surrogate, ordering and performing treatments and interventions, ordering and review of laboratory studies, ordering and  review of radiographic studies, pulse oximetry and re-evaluation of patient's condition.  Arvilla Meres, MD  6:52 PM

## 2021-05-23 ENCOUNTER — Inpatient Hospital Stay (HOSPITAL_COMMUNITY): Payer: Self-pay

## 2021-05-23 ENCOUNTER — Encounter (HOSPITAL_COMMUNITY): Payer: Self-pay | Admitting: Thoracic Surgery (Cardiothoracic Vascular Surgery)

## 2021-05-23 DIAGNOSIS — J9601 Acute respiratory failure with hypoxia: Secondary | ICD-10-CM

## 2021-05-23 DIAGNOSIS — D509 Iron deficiency anemia, unspecified: Secondary | ICD-10-CM

## 2021-05-23 DIAGNOSIS — I5043 Acute on chronic combined systolic (congestive) and diastolic (congestive) heart failure: Secondary | ICD-10-CM

## 2021-05-23 DIAGNOSIS — R570 Cardiogenic shock: Secondary | ICD-10-CM

## 2021-05-23 DIAGNOSIS — I472 Ventricular tachycardia, unspecified: Secondary | ICD-10-CM

## 2021-05-23 DIAGNOSIS — Z951 Presence of aortocoronary bypass graft: Secondary | ICD-10-CM

## 2021-05-23 LAB — CBC
HCT: 26.6 % — ABNORMAL LOW (ref 39.0–52.0)
HCT: 26.1 % — ABNORMAL LOW (ref 39.0–52.0)
Hemoglobin: 8.8 g/dL — ABNORMAL LOW (ref 13.0–17.0)
Hemoglobin: 8.4 g/dL — ABNORMAL LOW (ref 13.0–17.0)
MCH: 28.9 pg (ref 26.0–34.0)
MCH: 29 pg (ref 26.0–34.0)
MCHC: 33.1 g/dL (ref 30.0–36.0)
MCHC: 32.2 g/dL (ref 30.0–36.0)
MCV: 87.2 fL (ref 80.0–100.0)
MCV: 90 fL (ref 80.0–100.0)
Platelets: 264 10*3/uL (ref 150–400)
Platelets: 219 10*3/uL (ref 150–400)
RBC: 3.05 MIL/uL — ABNORMAL LOW (ref 4.22–5.81)
RBC: 2.9 MIL/uL — ABNORMAL LOW (ref 4.22–5.81)
RDW: 13.9 % (ref 11.5–15.5)
RDW: 14.2 % (ref 11.5–15.5)
WBC: 16.1 10*3/uL — ABNORMAL HIGH (ref 4.0–10.5)
WBC: 14.5 10*3/uL — ABNORMAL HIGH (ref 4.0–10.5)
nRBC: 0 % (ref 0.0–0.2)
nRBC: 0 % (ref 0.0–0.2)

## 2021-05-23 LAB — BASIC METABOLIC PANEL
Anion gap: 6 (ref 5–15)
Anion gap: 8 (ref 5–15)
BUN: 26 mg/dL — ABNORMAL HIGH (ref 6–20)
BUN: 28 mg/dL — ABNORMAL HIGH (ref 6–20)
CO2: 22 mmol/L (ref 22–32)
CO2: 22 mmol/L (ref 22–32)
Calcium: 8 mg/dL — ABNORMAL LOW (ref 8.9–10.3)
Calcium: 7.8 mg/dL — ABNORMAL LOW (ref 8.9–10.3)
Chloride: 104 mmol/L (ref 98–111)
Chloride: 95 mmol/L — ABNORMAL LOW (ref 98–111)
Creatinine, Ser: 1.64 mg/dL — ABNORMAL HIGH (ref 0.61–1.24)
Creatinine, Ser: 1.92 mg/dL — ABNORMAL HIGH (ref 0.61–1.24)
GFR, Estimated: 51 mL/min — ABNORMAL LOW (ref >60)
GFR, Estimated: 42 mL/min — ABNORMAL LOW (ref >60)
Glucose, Bld: 105 mg/dL — ABNORMAL HIGH (ref 70–99)
Glucose, Bld: 183 mg/dL — ABNORMAL HIGH (ref 70–99)
Potassium: 4.6 mmol/L (ref 3.5–5.1)
Potassium: 4.9 mmol/L (ref 3.5–5.1)
Sodium: 132 mmol/L — ABNORMAL LOW (ref 135–145)
Sodium: 125 mmol/L — ABNORMAL LOW (ref 135–145)

## 2021-05-23 LAB — GLUCOSE, CAPILLARY
Glucose-Capillary: 106 mg/dL — ABNORMAL HIGH (ref 70–99)
Glucose-Capillary: 113 mg/dL — ABNORMAL HIGH (ref 70–99)
Glucose-Capillary: 119 mg/dL — ABNORMAL HIGH (ref 70–99)
Glucose-Capillary: 123 mg/dL — ABNORMAL HIGH (ref 70–99)
Glucose-Capillary: 126 mg/dL — ABNORMAL HIGH (ref 70–99)
Glucose-Capillary: 134 mg/dL — ABNORMAL HIGH (ref 70–99)
Glucose-Capillary: 135 mg/dL — ABNORMAL HIGH (ref 70–99)
Glucose-Capillary: 157 mg/dL — ABNORMAL HIGH (ref 70–99)
Glucose-Capillary: 170 mg/dL — ABNORMAL HIGH (ref 70–99)
Glucose-Capillary: 178 mg/dL — ABNORMAL HIGH (ref 70–99)

## 2021-05-23 LAB — COOXEMETRY PANEL
Carboxyhemoglobin: 1.4 % (ref 0.5–1.5)
Methemoglobin: 1.1 % (ref 0.0–1.5)
O2 Saturation: 54.8 %
Total hemoglobin: 9 g/dL — ABNORMAL LOW (ref 12.0–16.0)

## 2021-05-23 LAB — MAGNESIUM
Magnesium: 2.9 mg/dL — ABNORMAL HIGH (ref 1.7–2.4)
Magnesium: 2.7 mg/dL — ABNORMAL HIGH (ref 1.7–2.4)

## 2021-05-23 MED ORDER — AMIODARONE HCL 200 MG PO TABS
400.0000 mg | ORAL_TABLET | Freq: Two times a day (BID) | ORAL | Status: DC
  Administered 2021-05-23 – 2021-05-26 (×8): 400 mg via ORAL
  Filled 2021-05-23 (×8): qty 2

## 2021-05-23 MED ORDER — ATORVASTATIN CALCIUM 80 MG PO TABS
80.0000 mg | ORAL_TABLET | Freq: Every day | ORAL | Status: DC
  Administered 2021-05-23 – 2021-05-29 (×7): 80 mg via ORAL
  Filled 2021-05-23 (×7): qty 1

## 2021-05-23 MED ORDER — DIGOXIN 125 MCG PO TABS
0.1250 mg | ORAL_TABLET | Freq: Every day | ORAL | Status: DC
  Administered 2021-05-23: 0.125 mg via ORAL
  Filled 2021-05-23: qty 1

## 2021-05-23 MED ORDER — INSULIN ASPART 100 UNIT/ML IJ SOLN
0.0000 [IU] | INTRAMUSCULAR | Status: DC
Start: 2021-05-23 — End: 2021-05-28
  Administered 2021-05-23 (×2): 4 [IU] via SUBCUTANEOUS
  Administered 2021-05-23 (×2): 2 [IU] via SUBCUTANEOUS
  Administered 2021-05-24: 8 [IU] via SUBCUTANEOUS
  Administered 2021-05-24: 4 [IU] via SUBCUTANEOUS
  Administered 2021-05-24: 2 [IU] via SUBCUTANEOUS
  Administered 2021-05-24: 8 [IU] via SUBCUTANEOUS
  Administered 2021-05-24 – 2021-05-25 (×2): 2 [IU] via SUBCUTANEOUS
  Administered 2021-05-25 (×2): 4 [IU] via SUBCUTANEOUS
  Administered 2021-05-25 – 2021-05-26 (×3): 2 [IU] via SUBCUTANEOUS
  Administered 2021-05-26 (×2): 4 [IU] via SUBCUTANEOUS
  Administered 2021-05-27 (×2): 2 [IU] via SUBCUTANEOUS
  Administered 2021-05-27: 4 [IU] via SUBCUTANEOUS
  Administered 2021-05-28: 2 [IU] via SUBCUTANEOUS

## 2021-05-23 MED FILL — Lidocaine HCl (Cardiac) IV PF Soln 100 MG/5ML (2%): INTRAVENOUS | Qty: 5 | Status: AC

## 2021-05-23 MED FILL — Heparin Sodium (Porcine) Inj 1000 Unit/ML: INTRAMUSCULAR | Qty: 10 | Status: AC

## 2021-05-23 MED FILL — Electrolyte-R (PH 7.4) Solution: INTRAVENOUS | Qty: 3000 | Status: AC

## 2021-05-23 MED FILL — Sodium Bicarbonate IV Soln 8.4%: INTRAVENOUS | Qty: 50 | Status: AC

## 2021-05-23 MED FILL — Calcium Chloride Inj 10%: INTRAVENOUS | Qty: 10 | Status: AC

## 2021-05-23 MED FILL — Potassium Chloride Inj 2 mEq/ML: INTRAVENOUS | Qty: 40 | Status: AC

## 2021-05-23 MED FILL — Heparin Sodium (Porcine) Inj 1000 Unit/ML: Qty: 1000 | Status: AC

## 2021-05-23 MED FILL — Sodium Chloride IV Soln 0.9%: INTRAVENOUS | Qty: 2000 | Status: AC

## 2021-05-23 MED FILL — Lidocaine HCl Local Preservative Free (PF) Inj 2%: INTRAMUSCULAR | Qty: 15 | Status: AC

## 2021-05-23 MED FILL — Mannitol IV Soln 20%: INTRAVENOUS | Qty: 500 | Status: AC

## 2021-05-23 NOTE — Consult Note (Signed)
NAME:  Adam James, MRN:  [id number], DOB:  1971/02/21, LOS: 1 ADMISSION DATE:  05/22/2021, CONSULTATION DATE:  05/23/21 REFERRING MD:  Cliffton Asters, CHIEF COMPLAINT:  post-CABG   History of Present Illness:  Adam James is a 50 year old gentleman with a history of chronic heart failure and coronary artery disease who was admitted on 05/22/21 for CABG.  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  He has a history of tobacco abuse, hypertension, diabetes.  He has biventricular, left greater than right cardiomyopathy due to ischemia.  Cardiac MRI demonstrated myocardial viability.  He underwent three-vessel CABG with LIMA to LAD, R SVG to OM1 and OM 3.  With greater saphenous vein harvest on the right.  EBL 500cc, 2 units RBCs were administered intraoperatively.  Transitioning off bypass he had episodes of ventricular tachycardia, status post loading with amiodarone.  He was extubated per postop vent weaning protocol. Today he complaints of chest pain with movement and coughing but denies shortness of breath.   Pertinent  Medical History  CAD CKD Diabetic foot ulcer Type 2 diabetes Anemia Hyperlipidemia  Significant Hospital Events: Including procedures, antibiotic start and stop dates in addition to other pertinent events   10/27 three-vessel CABG, extubated postoperatively 10/28 removing arterial line and Foley catheter  Interim History / Subjective:    Objective   Blood pressure 116/68, pulse 91, temperature 98.4 F (36.9 C), resp. rate (!) 22, height  (1.905 m), weight 109.6 kg, SpO2 100 %. PAP: (19-48)/(9-22) 26/15 CVP:  [0 mmHg-9 mmHg] 5 mmHg CO:  [4.3 L/min-6.2 L/min] 6.2 L/min CI:  [1.8 L/min/m2-2.6 L/min/m2] 2.6 L/min/m2  Vent Mode: CPAP;PSV FiO2 (%):  [50 %] 50 % Set Rate:  [12 bmp-16 bmp] 16 bmp Vt Set:  [670 mL] 670 mL PEEP:  [5 cmH20] 5  cmH20 Pressure Support:  [10 cmH20] 10 cmH20 Plateau Pressure:  [16 cmH20] 16 cmH20   Intake/Output Summary (Last 24 hours) at 05/23/2021 0908 Last data filed at 05/23/2021 0800 Gross per 24 hour  Intake 5771.94 ml  Output 2370 ml  Net 3401.94 ml   Filed Weights   05/22/21 0543 05/23/21 0645  Weight: 108.9 kg 109.6 kg    Examination: General: middle aged man lying in bed in NAD HENT: [place]/AT, eyes anicteric Lungs: breathing comfortably on [place], minimal basilar rhales, no conversational dyspnea Cardiovascular: S1S2, RRR, intact sternal dressing, mediastinal and chest tube with bloody output Abdomen: soft, NT Extremities: left foot bandaged post-amputation, venous graft side on  RLE without erythema Neuro: awake and alert, moving all extremities GU: foley draining clear yellow urine  [place] 55% on NE CXR personally reviewed> left effusion, mild pulm edema Na+ 132 BUN 26 Cr 1.64 WBC 16.1 H/H 8.8/26.6 EKG> NSR, left axis, normal intervals. TWI in I, AVL.  Resolved Hospital Problem list      Assessment & Plan:  CAD, acute on chronic HFrEF due to ischemic cardiomyopathy Ventricular tachycardia postoperatively - Aspirin, statin.  Planning to start Plavix later this admission. - Routine postop care per TCTS - Maintain electrolytes per protocol. - Con't amiodarone - Continue digoxin; monitoring per pharmacy -Tele monitoring -Norepi as required to maintain MAP greater than 65 and appropriate.  Wean off as tolerated.  Serial cooximetry. Appreciate cardiology's management -Metoprolol once able to maintain cardiac output off inotropic support -Worry about mobility limitations with concern of foot wound dehiscence and maintaining sternal precautions.  Worry that he may need rehab after discharge.  Acute respiratory failure with hypoxia - Pulmonary hygiene; use pillow to splint with coughing - Wean supplemental oxygen as able to maintain SPO2 greater than 90%. -Diuresis per T  CTS.  CKD IIIb -Strict I's/O - Renally dose meds and avoid nephrotoxic meds - Continue to monitor - Maintain adequate renal perfusion  Chronic iron deficiency anemia, likely also component of chronic iron deficiency -Transfuse for hemoglobin less than 7 or hemodynamically significant bleeding. - Continue to monitor - Needs enteral iron supplements.  DM2 -Insulin infusion protocol, transition to basal bolus on appropriate - Goal blood glucose less than 180.  History of GI bleed with erosive esophagitis - Continue PPI  Left leg diabetic foot infection-necrotizing fasciitis of the left leg in August 2022 Readmission for osteomyelitis, status post fifth ray amputation on the left foot in October 2022 - Completed antibiotics on 05/03/2021 -Minimize weightbearing due to risk of wound dehiscence per Ortho in 05/06/2021 note.  May need to reconsult if additional mobility recommendations are required.   Helpful to remove chest tubes and pacing wires tomorrow.  Best Practice (right click and "Reselect all SmartList Selections" daily)   Diet/type: Regular consistency (see orders) DVT prophylaxis: SCD GI prophylaxis: PPI Lines: Central line and yes and it is still needed Foley:  Yes, and it is no longer needed and removal ordered  Code Status:  full code Last date of multidisciplinary goals of care discussion [ ]   Labs   CBC: Recent Labs  Lab 05/20/21 1000 05/22/21 0806 05/22/21 1106 05/22/21 1148 05/22/21 1300 05/22/21 1302 05/22/21 1453 05/22/21 1639 05/22/21 1833 05/22/21 1846 05/23/21 0443  WBC 9.6  --   --   --  17.7*  --   --   --   --  15.7* 16.1*  HGB 9.5*   < > 7.6*   < > 8.7*   < > 8.5* 8.8* 8.8* 9.3* 8.8*  HCT 29.9*   < > 22.9*   < > 27.4*   < > 25.0* 26.0* 26.0* 28.5* 26.6*  MCV 90.9  --   --   --  89.3  --   --   --   --  88.0 87.2  PLT 273  --  224  --  220  --   --   --   --  284 264   < > = values in this interval not displayed.    Basic Metabolic  Panel: Recent Labs  Lab 05/20/21 1000 05/22/21 0806 05/22/21 0925 05/22/21 0954 05/22/21 1037 05/22/21 1104 05/22/21 1152 05/22/21 1302 05/22/21 1453 05/22/21 1639 05/22/21 1833 05/22/21 1846 05/23/21 0443  NA 135   < > 138   < > 138   < > 138   < > 136 136 136 135 132*  K 4.0   < > 3.7   < > 4.2   < > 5.0   < > 5.2* 5.2* 5.4* 5.3* 4.6  CL 104  --  101  --  102  --  103  --   --   --   --  106 104  CO2 21*  --   --   --   --   --   --   --   --   --   --  22 22  GLUCOSE 167*  --  134*  --  142*  --  148*  --   --   --   --  130* 105*  BUN 23*  --  22*  --  21*  --  21*  --   --   --   --  25* 26*  CREATININE 1.49*  --  1.40*  --  1.30*  --  1.40*  --   --   --   --  1.69* 1.64*  CALCIUM 8.3*  --   --   --   --   --   --   --   --   --   --  8.2* 8.0*  MG  --   --   --   --   --   --   --   --   --   --   --  3.2* 2.9*   < > = values in this interval not displayed.   GFR: Estimated Creatinine Clearance: 73.6 mL/min (A) (by C-G formula based on SCr of 1.64 mg/dL (H)). Recent Labs  Lab 05/20/21 1000 05/22/21 1300 05/22/21 1846 05/23/21 0443  WBC 9.6 17.7* 15.7* 16.1*    Liver Function Tests: Recent Labs  Lab 05/20/21 1000  AST 11*  ALT 11  ALKPHOS 53  BILITOT 0.7  PROT 6.3*  ALBUMIN 2.6*   No results for input(s): LIPASE, AMYLASE in the last 168 hours. No results for input(s): AMMONIA in the last 168 hours.  ABG    Component Value Date/Time   PHART 7.348 (L) 05/22/2021 1833   PCO2ART 43.4 05/22/2021 1833   PO2ART 147 (H) 05/22/2021 1833   HCO3 23.7 05/22/2021 1833   TCO2 25 05/22/2021 1833   ACIDBASEDEF 2.0 05/22/2021 1833   O2SAT 54.8 05/23/2021 0725     Coagulation Profile: Recent Labs  Lab 05/20/21 1000 05/22/21 1300  INR 1.1 1.4*    Cardiac Enzymes: No results for input(s): CKTOTAL, CKMB, CKMBINDEX, TROPONINI in the last 168 hours.  HbA1C: Hgb A1c MFr Bld  Date/Time Value Ref Range Status  05/20/2021 09:30 AM 6.5 (H) 4.8 - 5.6 % Final     Comment:    (NOTE) Pre diabetes:          5.7%-6.4%  Diabetes:              >6.4%  Glycemic control for   <7.0% adults with diabetes   04/28/2021 09:54 PM 6.1 (H) 4.8 - 5.6 % Final    Comment:    (NOTE) Pre diabetes:          5.7%-6.4%  Diabetes:              >6.4%  Glycemic control for   <7.0% adults with diabetes     CBG: Recent Labs  Lab 05/23/21 0030 05/23/21 0201 05/23/21 0442 05/23/21 0609 05/23/21 0802  GLUCAP 126* 123* 106* 113* 135*    Review of Systems:   Review of Systems  Constitutional:  Negative for chills and fever.  HENT:  Negative for congestion and nosebleeds.   Eyes: Negative.   Respiratory:  Negative for cough, sputum production and shortness of breath.   Cardiovascular:  Negative for leg swelling.       CP with coughing and breathing deeply  Gastrointestinal:  Negative for nausea and vomiting.  Genitourinary: Negative.   Musculoskeletal:  Negative for back pain and myalgias.  Skin:  Negative for rash.  Neurological:  Negative for focal weakness and headaches.  Endo/Heme/Allergies: Negative.     Past Medical History:  He,  has a past medical history of Anemia, CAD (coronary artery disease), Cardiomyopathy (  HCC), CHF (congestive heart failure) (HCC), CKD (chronic kidney disease), Diabetic foot ulcer (HCC), Dyslipidemia, Heart failure (HCC), Myocardial infarction (HCC), and Type 2 diabetes mellitus (HCC).   Surgical History:   Past Surgical History:  Procedure Laterality Date   AMPUTATION Left 04/30/2021   Procedure: LEFT FOOT 5TH RAY AMPUTATION APPLICATION OF A-CELL POWDER;  Surgeon: [name], MD;  Location: [facility] OR;  Service: Orthopedics;  Laterality: Left;   APPENDECTOMY     APPLICATION OF WOUND VAC Left 03/03/2021   Procedure: APPLICATION OF WOUND VAC;  Surgeon: Tarry Kos, MD;  Location: [facility] OR;  Service: Orthopedics;  Laterality: Left;   APPLICATION OF WOUND VAC  03/07/2021   Procedure: APPLICATION OF WOUND VAC;  Surgeon: [name], MD;  Location: [facility] OR;  Service: Orthopedics;;   CARDIAC CATHETERIZATION     COLON SURGERY     ESOPHAGOGASTRODUODENOSCOPY (EGD) WITH PROPOFOL N/A 03/12/2021   Procedure: ESOPHAGOGASTRODUODENOSCOPY (EGD) WITH PROPOFOL;  Surgeon: Jenel Lucks, MD;  Location: [facility] ENDOSCOPY;  Service: Gastroenterology;  Laterality: N/A;   I & D EXTREMITY Left 02/24/2021   Procedure: IRRIGATION AND DEBRIDEMENT EXTREMITY;  Surgeon: Myrene Galas, MD;  Location: [facility] OR;  Service: Orthopedics;  Laterality: Left;   I & D EXTREMITY Left 02/26/2021   Procedure: IRRIGATION AND DEBRIDEMENT OF LEG;  Surgeon: [name], MD;  Location: [facility] OR;  Service: Orthopedics;  Laterality: Left;   I & D EXTREMITY Left 03/03/2021   Procedure: LEFT LEG IRRIGATION AND DEBRIDEMENT;  Surgeon: Tarry Kos, MD;  Location: [facility] OR;  Service: Orthopedics;  Laterality: Left;   I & D EXTREMITY Left 03/07/2021   Procedure: REPEAT DEBRIDEMENT LEFT LEG;  Surgeon: [name], MD;  Location: [facility] OR;  Service: Orthopedics;  Laterality: Left;   IR FLUORO GUIDE CV LINE RIGHT  03/10/2021   IR REMOVAL TUN CV CATH W/O FL  03/24/2021   IR US GUIDE VASC ACCESS RIGHT  03/10/2021   LEFT HEART CATH AND CORONARY ANGIOGRAPHY N/A 09/13/2020   Procedure: LEFT HEART CATH AND CORONARY ANGIOGRAPHY;  Surgeon: Lennette Bihari, MD;  Location: [facility] INVASIVE CV LAB;  Service: Cardiovascular;  Laterality: N/A;   LEFT HEART CATH AND CORONARY ANGIOGRAPHY N/A 03/28/2021   Procedure: LEFT HEART CATH AND CORONARY ANGIOGRAPHY;  Surgeon: Laurey Morale, MD;  Location: [facility] INVASIVE CV LAB;  Service: Cardiovascular;  Laterality: N/A;   RIGHT HEART CATH N/A 03/26/2021   Procedure: RIGHT HEART CATH;  Surgeon: Laurey Morale, MD;  Location: [facility] INVASIVE CV LAB;  Service: Cardiovascular;  Laterality: N/A;   TOE AMPUTATION  2020     Social History:   reports that he quit smoking about 8 months ago. His smoking use included cigarettes. He has a 108.00 pack-year smoking history.  He has never used smokeless tobacco. He reports that he does not currently use alcohol. He reports that he does not currently use drugs after having used the following drugs: Marijuana.   Family History:  His family history includes Congestive Heart Failure in his brother; Heart failure in his father; Hypertension in his brother and mother.   Allergies Allergies  Allergen Reactions   Morphine Itching     Home Medications  Prior to Admission medications   Medication Sig Start Date End Date Taking? Authorizing Provider  aspirin 81 MG EC tablet Take 1 tablet (81 mg total) by mouth daily. 05/07/21  Yes Verdene Lennert, MD  atorvastatin (LIPITOR) 80 MG tablet Take 1 tablet (80 mg  total) by mouth daily. 05/07/21  Yes Verdene Lennert, MD  glucose blood (TRUE METRIX BLOOD GLUCOSE TEST) test strip Use as instructed twice daily 05/12/21  Yes Mayers, Cari S, PA-C  glyBURIDE (DIABETA) 5 MG tablet Take 1 tablet (5 mg total) by mouth 2 (two) times daily. 05/12/21  Yes Mayers, Cari S, PA-C  metFORMIN (GLUCOPHAGE) 500 MG tablet Take 1 tablet (500 mg total) by mouth 2 (two) times daily. 05/12/21  Yes Mayers, Cari S, PA-C  metoprolol succinate (TOPROL-XL) 25 MG 24 hr tablet Take 1 tablet (25 mg total) by mouth every evening. 05/07/21 06/06/21 Yes Princess Bruins, DO  mexiletine (MEXITIL) 200 MG capsule Take 1 capsule (200 mg total) by mouth 2 (two) times daily. 05/07/21 06/06/21 Yes Princess Bruins, DO  potassium chloride SA (KLOR-CON) 20 MEQ tablet Take 1 tablet (20 mEq total) by mouth daily. 05/07/21  Yes Verdene Lennert, MD  sacubitril-valsartan (ENTRESTO) 24-26 MG Take 1 tablet by mouth 2 (two) times daily. 05/07/21 06/06/21 Yes Princess Bruins, DO  spironolactone (ALDACTONE) 25 MG tablet Take 1 tablet (25 mg total) by mouth daily. 05/07/21 06/06/21 Yes Laurey Morale, MD  torsemide (DEMADEX) 20 MG tablet Take 2 tablets (40 mg total) by mouth daily. 05/08/21 06/07/21 Yes Princess Bruins, DO  TRUEplus Lancets  28G MISC Use as directed twice daily at 8 am and 10 pm. 05/12/21  Yes Mayers, Cari S, PA-C     Critical care time: 36 min.    Steffanie Dunn, DO 05/23/21 3:19 PM [facility]

## 2021-05-23 NOTE — Progress Notes (Addendum)
Advanced Heart Failure Rounding Note  PCP-Cardiologist: Norman Herrlich, MD   Subjective:   10/27: CABG X 3. VT coming off pump s/p defibrillation  No recurrent VT overnight, on 30 IV amio  Extubated yesterday evening. O2 sats stable on 3L [place]  Reports some sternal pain with deep inspiration otherwise feels okay  Scr stable at 1.64  WBC 9.6 preop, 18 > 16   CVP 4-5 SWAN #s PA 21/12 CO 6.2 CI 2.6    Objective:   Weight Range: 109.6 kg Body mass index is 30.2 kg/m.   Vital Signs:   Temp:  [97.7 F (36.5 C)-99.5 F (37.5 C)] 98.6 F (37 C) (10/28 0645) Pulse Rate:  [61-106] 101 (10/28 0645) Resp:  [12-37] 27 (10/28 0645) SpO2:  [97 %-100 %] 97 % (10/28 0645) Arterial Line BP: (74-273)/(30-79) 148/71 (10/28 0645) FiO2 (%):  [50 %] 50 % (10/27 1300) Weight:  [109.6 kg] 109.6 kg (10/28 0645) Last BM Date: 05/21/21  Weight change: Filed Weights   05/22/21 0543 05/23/21 0645  Weight: 108.9 kg 109.6 kg    Intake/Output:   Intake/Output Summary (Last 24 hours) at 05/23/2021 0703 Last data filed at 05/23/2021 0700 Gross per 24 hour  Intake 5862.81 ml  Output 2895 ml  Net 2967.81 ml      Physical Exam   CVP 4-5 General:  Well appearing. No resp difficulty HEENT: Normal Neck: Supple. No JVD. Carotids 2+ bilat; no bruits. RIJ  Cor: PMI nondisplaced. Regular rate & rhythm. Dressing over sternal incision Lungs: Clear anteriorly. R&L sided chest tubes Abdomen: Soft, nontender, nondistended. No hepatosplenomegaly. No bruits or masses. Good bowel sounds. Extremities: No cyanosis, clubbing, rash, no edema, left radial Aline Neuro: Alert & orientedx3, cranial nerves grossly intact. moves all 4 extremities w/o difficulty. Affect pleasant   Telemetry   NSR 80s, no recurrent VT  Labs    CBC Recent Labs    05/22/21 1846 05/23/21 0443  WBC 15.7* 16.1*  HGB 9.3* 8.8*  HCT 28.5* 26.6*  MCV 88.0 87.2  PLT 284 264   Basic Metabolic Panel Recent Labs     05/22/21 1846 05/23/21 0443  NA 135 132*  K 5.3* 4.6  CL 106 104  CO2 22 22  GLUCOSE 130* 105*  BUN 25* 26*  CREATININE 1.69* 1.64*  CALCIUM 8.2* 8.0*  MG 3.2* 2.9*   Liver Function Tests Recent Labs    05/20/21 1000  AST 11*  ALT 11  ALKPHOS 53  BILITOT 0.7  PROT 6.3*  ALBUMIN 2.6*   No results for input(s): LIPASE, AMYLASE in the last 72 hours. Cardiac Enzymes No results for input(s): CKTOTAL, CKMB, CKMBINDEX, TROPONINI in the last 72 hours.  BNP: BNP (last 3 results) Recent Labs    03/23/21 0045 04/25/21 1505 04/28/21 1047  BNP 1,129.9* 1,691.7* 1,072.2*    ProBNP (last 3 results) Recent Labs    10/07/20 1004  PROBNP 468*     D-Dimer No results for input(s): DDIMER in the last 72 hours. Hemoglobin A1C Recent Labs    05/20/21 0930  HGBA1C 6.5*   Fasting Lipid Panel No results for input(s): CHOL, HDL, LDLCALC, TRIG, CHOLHDL, LDLDIRECT in the last 72 hours. Thyroid Function Tests No results for input(s): TSH, T4TOTAL, T3FREE, THYROIDAB in the last 72 hours.  Invalid input(s): FREET3  Other results:   Imaging    DG Chest Port 1 View  Result Date: 05/22/2021 CLINICAL DATA:  Status post coronary artery bypass graft (CABG). EXAM: PORTABLE CHEST  1 VIEW COMPARISON:  Chest radiograph dated 05/20/2021. FINDINGS: An endotracheal tube terminates in the midthoracic trachea. An enteric tube terminates in the stomach with the side port near the gastroesophageal junction. A right internal jugular swans Ganz catheter tip overlies the main pulmonary artery. Three mediastinal drains are noted. The lungs are clear without pleural effusion. There is suggestion of a trace pneumothorax in the left lung apex which may be postoperative in nature. There is no significant pneumothorax on the right. Median sternotomy wires are noted. IMPRESSION: Suggestion of a trace pneumothorax in the left lung apex may be postoperative. These results will be called to the ordering  clinician or representative by the Radiologist Assistant, and communication documented in the PACS or [employer]. Electronically Signed   By: Romona Curls M.D.   On: 05/22/2021 13:29   ECHO INTRAOPERATIVE TEE  Result Date: 05/22/2021  *INTRAOPERATIVE TRANSESOPHAGEAL REPORT *  Patient Name:   Adam James Date of Exam: 05/22/2021 Medical Rec #:  [id number]        Height:       75.0 in Accession #:    [id number]       Weight:       240.0 lb Date of Birth:  [birth year]        BSA:          2.37 m Patient Age:    49 years         BP:           108/78 mmHg Patient Gender: M                HR:           91 bpm. Exam Location:  Inpatient Transesophogeal exam was perform intraoperatively during surgical procedure. Patient was closely monitored under general anesthesia during the entirety of examination. Indications:     CABG Performing Phys: [id number] Eliezer Lofts LIGHTFOOT Diagnosing Phys: Lewie Loron MD Complications: No known complications during this procedure. POST-OP IMPRESSIONS _ Left Ventricle: The left ventricle is unchanged from pre-bypass. _ Right Ventricle: The right ventricle appears unchanged from pre-bypass. _ Aorta: The aorta appears unchanged from pre-bypass. _ Left Atrial Appendage: The left atrial appendage appears unchanged from pre-bypass. _ Aortic Valve: The aortic valve appears unchanged from pre-bypass. _ Mitral Valve: The mitral valve appears unchanged from pre-bypass. _ Tricuspid Valve: The tricuspid valve appears unchanged from pre-bypass. _ Pulmonic Valve: The pulmonic valve appears unchanged from pre-bypass. _ Interatrial Septum: The interatrial septum appears unchanged from pre-bypass. PRE-OP FINDINGS  Left Ventricle: The left ventricle has severely reduced systolic function, with an ejection fraction of 20-30%. The cavity size was severely dilated. Right Ventricle: The right ventricle has normal systolic function. The cavity was normal. There is no increase in right ventricular  wall thickness. Left Atrium: Left atrial size was dilated. No left atrial/left atrial appendage thrombus was detected. Right Atrium: Right atrial size was normal in size. Interatrial Septum: No atrial level shunt detected by color flow Doppler. There is no evidence of a patent foramen ovale. Pericardium: There is no evidence of pericardial effusion. Mitral Valve: The mitral valve is normal in structure. Mitral valve regurgitation is moderate by color flow Doppler. There is no evidence of mitral valve vegetation. Tricuspid Valve: The tricuspid valve was normal in structure. Tricuspid valve regurgitation is mild by color flow Doppler. Aortic Valve: The aortic valve is tricuspid Aortic valve regurgitation is mild by color flow Doppler. There is no stenosis of the aortic valve. There  is no evidence of aortic valve vegetation. Pulmonic Valve: The pulmonic valve was normal in structure. Pulmonic valve regurgitation is trivial by color flow Doppler. Aorta: The aortic root and ascending aorta are normal in size and structure. There is evidence of plaque in the descending aorta; Grade I, measuring 1-23mm in size. Shunts: There is no evidence of an atrial septal defect.  Lewie Loron MD Electronically signed by Lewie Loron MD Signature Date/Time: 05/22/2021/5:53:42 PM    Final      Medications:     Scheduled Medications:  acetaminophen  1,000 mg Oral Q6H   Or   acetaminophen (TYLENOL) oral liquid 160 mg/5 mL  1,000 mg Per Tube Q6H   aspirin EC  325 mg Oral Daily   Or   aspirin  324 mg Per Tube Daily   bisacodyl  10 mg Oral Daily   Or   bisacodyl  10 mg Rectal Daily   Chlorhexidine Gluconate Cloth  6 each Topical Daily   Chlorhexidine Gluconate Cloth  6 each Topical Q0600   docusate sodium  200 mg Oral Daily   metoprolol tartrate  12.5 mg Oral BID   Or   metoprolol tartrate  12.5 mg Per Tube BID   [START ON 05/24/2021] pantoprazole  40 mg Oral Daily   sodium chloride flush  10-40 mL Intracatheter  Q12H   sodium chloride flush  3 mL Intravenous Q12H    Infusions:  sodium chloride 20 mL/hr at 05/23/21 0700   sodium chloride     sodium chloride 20 mL/hr at 05/22/21 1250   albumin human     And   sodium chloride     amiodarone 30 mg/hr (05/23/21 0700)    ceFAZolin (ANCEF) IV Stopped (05/23/21 0636)   dexmedetomidine (PRECEDEX) IV infusion Stopped (05/23/21 0610)   DOBUTamine     insulin 1.8 Units/hr (05/23/21 0700)   lactated ringers     lactated ringers     lactated ringers 20 mL/hr at 05/23/21 0700   niCARDipine     nitroGLYCERIN     norepinephrine (LEVOPHED) Adult infusion 3 mcg/min (05/23/21 0700)   phenylephrine (NEO-SYNEPHRINE) Adult infusion      PRN Medications: sodium chloride, albumin human **AND** sodium chloride, dextrose, fentaNYL (SUBLIMAZE) injection, lactated ringers, metoprolol tartrate, midazolam, ondansetron (ZOFRAN) IV, sodium chloride flush, sodium chloride flush, traMADol    Patient Profile   Adam James is a 50 year old with history of CAD s/p PCI/stent 2007 in [place], chronic HFrEF/ICM, CKD Stage IIIa, HTN, hyperlipidemia, DMII, 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    Admitted for scheduled CABG    Assessment/Plan   S/P CABG x3  10/27  - LHC 08/2020--> 50% dLM, 70% ramus, 70% pLCx, totally occluded proximal RCA, 90% ostial to mid LAD, 90% mid LAD, totally occluded distal LAD.   - Cardiac MRI in 4/22 showed EF 40% with substantial viable myocardium, only delayed enhancement was subendocardial in the mid to apical inferior wall.   -Currently on Norepi 3 mcg. Swan #s ok. CVP 4-5. Co-ox pending - Extubated overnight. 02 sats stable on 3L - Immediate post op care per Dr Cliffton Asters.  - WBC 16 post-op, trend. Hgb stable upper 8s-low 9s   2. VT  - H/O frequent PVCs  - Had LifeVest placed during most recent admit a few weeks ago but took it off d/t frequent alarms - Had VT coming off pump, s/p lidocaine  and defibrillation. No recurrent overnight. - Continue amio drip  3. Chronic HFrEF, ICM  -Most recent ECHO 02/2021 EF 25% with mild RV dysfunciton  - On NE 3, checking Co-ox.  - CVP 4-5 - Hold off on GDMT    4. CKD Stage IIIb -Creatinine baseline 1.3-1.5, Scr 1.6 this am -Follow renal function closely    5. DMII -Per primary team   6. H/O GI Bleed in the setting of erosive esophagitis  -Continue PPI   7. 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    Length of Stay: 1  FINCH, LINDSAY N, PA-C  05/23/2021, 7:03 AM  Advanced Heart Failure Team Pager [phone] (M-F; 7a - 5p)  Please contact [facility] for night-coverage after hours (5p -7a ) and weekends on [email or web address]    Patient seen with PA, agree with the above note.   Complains of pain this morning.  CI 2.6, CVP 4, co-ox 55%.  Creatinine stable 1.64.  He is currently in NSR on amiodarone (episode of VT coming off pump).  He remains on NE 3.   General: NAD Neck: No JVD, no thyromegaly or thyroid nodule.  Lungs: Clear to auscultation bilaterally with normal respiratory effort. CV: Nondisplaced PMI.  Heart regular S1/S2, no S3/S4, no murmur.  Trace lower leg edema.   Abdomen: Soft, nontender, no hepatosplenomegaly, no distention.  Skin: Intact without lesions or rashes.  Neurologic: Alert and oriented x 3.  Psych: Normal affect. Extremities: LLE wrapped.  HEENT: Normal.   1. Acute on chronic HFrEF:  With prior concern for low output HF.  Ischemic cardiomyopathy.  ? If frequent PVCs also contributing. Initial echo in 2/22 EF 40-45%.  Echo in 7/22 EF 30-35%.  Echo in 8/22 EF down to 25% with mild RV dysfunction.  Now s/p CABG, stable currently on NE 3 with CI 2.6 off Swan, co-ox 55%, CVP 4.  - OK to remove Swan.  Follow CVP/co-ox off CVL.  - Slowly wean off NE.  - Can add digoxin for the time being as long as creatinine remains stable.   - No diuresis yet.   2. CAD: LHC in 2/22 with 50% dLM, 70% ramus, 70% pLCx, totally occluded proximal RCA, 90% stenosis ostial to mid LAD, 90% mid LAD, totally occluded distal LAD.  Cardiac MRI in 4/22 showed EF 40% with substantial viable myocardium, only delayed enhancement was subendocardial in the mid to apical inferior wall.  CABG had been planned in 8/22, but patient was admitted left foot diabetic foot infection that progressed to necrotizing fasciitis s/p debridement and IV abx.  Repeat coronary angiography (9/22) showed minimal change from prior study in 2/22. Now s/p CABG x 3 with LIMA-LAD, SVG-OM1, SVG-OM3.  PDA too small to graft.  - Continue ASA.  - Restart atorvastatin 80 mg daily.  3. Frequent PVCs/VT perioperatively:  On amiodarone gtt.  - Can transition to po amiodarone.  4. CKD, Stage 3a:  Creatinine stable at 1.6.   5. H/o GI bleeding: History of erosive esophagitis in 8/22.   - Continue Protonix.  6. Left leg diabetic foot infection => necrotizing fasciitis in August 2022.  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 Completed abx. 7. DM2  CRITICAL CARE Performed by: Marca Ancona  Total critical care time: 35 minutes  Critical care time was exclusive of separately billable procedures and treating other patients.  Critical care was necessary to treat or prevent imminent or life-threatening deterioration.  Critical care was time spent personally by me on  the following activities: development of treatment plan with patient and/or surrogate as well as nursing, discussions with consultants, evaluation of patient's response to treatment, examination of patient, obtaining history from patient or surrogate, ordering and performing treatments and interventions, ordering and review of laboratory studies, ordering and review of radiographic studies, pulse oximetry and re-evaluation of patient's condition.  Marca Ancona 05/23/2021 8:36 AM

## 2021-05-23 NOTE — Discharge Instructions (Signed)

## 2021-05-23 NOTE — Progress Notes (Addendum)
[address]       [place] [address]             [phone]      1 Day Post-Op  Procedure(s) (LRB): CORONARY ARTERY BYPASS GRAFTING (CABG) X 3 USING LEFT INTERNAL MAMMARY ARTERY AND RIGHT ENDOSCOPIC GREATER SAPHENOUS VEIN CONDUITS (N/A) TRANSESOPHAGEAL ECHOCARDIOGRAM (TEE) (N/A) APPLICATION OF CELL SAVER ENDOVEIN HARVEST OF GREATER SAPHENOUS VEIN (Right)   Total Length of Stay:  LOS: 1 day    SUBJECTIVE:  Vitals:   05/23/21 1800 05/23/21 1900  BP: 104/75 101/68  Pulse: 85 87  Resp: 20 16  Temp:    SpO2: 94% 93%    Intake/Output      10/28 0701 10/29 0700   P.O. 390   I.V. (mL/kg) 589.7 (5.4)   Blood    IV Piggyback 100   Total Intake(mL/kg) 1079.7 (9.9)   Urine (mL/kg/hr) 610 (0.5)   Blood    Chest Tube 50   Total Output 660   Net +419.7           sodium chloride Stopped (05/23/21 0936)   sodium chloride     sodium chloride 20 mL/hr at 05/22/21 1250   albumin human     And   sodium chloride      ceFAZolin (ANCEF) IV Stopped (05/23/21 1434)   insulin Stopped (05/23/21 1355)   lactated ringers     lactated ringers     lactated ringers 20 mL/hr at 05/23/21 1600   norepinephrine (LEVOPHED) Adult infusion Stopped (05/23/21 1400)    CBC    Component Value Date/Time   WBC 14.5 (H) 05/23/2021 1709   RBC 2.90 (L) 05/23/2021 1709   HGB 8.4 (L) 05/23/2021 1709   HGB 9.9 (L) 05/12/2021 1502   HCT 26.1 (L) 05/23/2021 1709   HCT 30.9 (L) 05/12/2021 1502   PLT 219 05/23/2021 1709   PLT 272 05/12/2021 1502   MCV 90.0 05/23/2021 1709   MCV 89 05/12/2021 1502   MCH 29.0 05/23/2021 1709   MCHC 32.2 05/23/2021 1709   RDW 14.2 05/23/2021 1709   RDW 14.1 05/12/2021 1502   LYMPHSABS 4.2 (H) 05/12/2021 1502   MONOABS 0.4 05/03/2021 0301   EOSABS 0.2 05/12/2021 1502   BASOSABS 0.1 05/12/2021 1502   CMP     Component Value Date/Time   NA 125 (L) 05/23/2021 1709   NA 142 05/12/2021 1502   K 4.9 05/23/2021 1709   CL 95 (L)  05/23/2021 1709   CO2 22 05/23/2021 1709   GLUCOSE 183 (H) 05/23/2021 1709   BUN 28 (H) 05/23/2021 1709   BUN 22 05/12/2021 1502   CREATININE 1.92 (H) 05/23/2021 1709   CALCIUM 7.8 (L) 05/23/2021 1709   PROT 6.3 (L) 05/20/2021 1000   ALBUMIN 2.6 (L) 05/20/2021 1000   AST 11 (L) 05/20/2021 1000   ALT 11 05/20/2021 1000   ALKPHOS 53 05/20/2021 1000   BILITOT 0.7 05/20/2021 1000   GFRNONAA 42 (L) 05/23/2021 1709   GFRAA 86 09/09/2020 1341   ABG    Component Value Date/Time   PHART 7.348 (L) 05/22/2021 1833   PCO2ART 43.4 05/22/2021 1833   PO2ART 147 (H) 05/22/2021 1833   HCO3 23.7 05/22/2021 1833   TCO2 25 05/22/2021 1833   ACIDBASEDEF 2.0 05/22/2021 1833   O2SAT 54.8 05/23/2021 0725   CBG (last 3)  Recent Labs    05/23/21 1200 05/23/21 1414 05/23/21 1658  GLUCAP 119* 157* 178*  ASSESSMENT:  Stable day, no drips... EPW not removed, will plan to remove in AM   Erin Barrett, PA-C 7:22 PM 05/23/21    Agree with above.  Janal Haak Keane Scrape

## 2021-05-23 NOTE — Anesthesia Postprocedure Evaluation (Signed)
Anesthesia Post Note  Patient: Adam James  Procedure(s) Performed: CORONARY ARTERY BYPASS GRAFTING (CABG) X 3 USING LEFT INTERNAL MAMMARY ARTERY AND RIGHT ENDOSCOPIC GREATER SAPHENOUS VEIN CONDUITS (Chest) TRANSESOPHAGEAL ECHOCARDIOGRAM (TEE) APPLICATION OF CELL SAVER ENDOVEIN HARVEST OF GREATER SAPHENOUS VEIN (Right)     Patient location during evaluation: PACU Anesthesia Type: General Level of consciousness: sedated and patient remains intubated per anesthesia plan Pain management: pain level controlled Vital Signs Assessment: post-procedure vital signs reviewed and stable Respiratory status: patient remains intubated per anesthesia plan Cardiovascular status: stable Anesthetic complications: no   No notable events documented.  Last Vitals:  Vitals:   05/23/21 0645 05/23/21 0800  BP:    Pulse: (!) 101 91  Resp: (!) 27 (!) 22  Temp: 37 C 36.9 C  SpO2: 97% 100%    Last Pain:  Vitals:   05/23/21 0800  TempSrc:   PainSc: 0-No pain                 Lewie Loron

## 2021-05-23 NOTE — Discharge Summary (Signed)
[address]       [place] [address]             [phone]    Physician Discharge Summary  Patient ID: Adam James MRN: [id number] DOB/AGE: 49/03/1971 49 y.o.  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   Acute osteomyelitis of left foot (HCC)    Acute exacerbation of CHF (congestive heart failure) (HCC) 04/28/2021   Venous stasis ulcer of right calf limited to breakdown of skin without varicose veins (HCC)    Non-pressure chronic ulcer of other part of left foot limited to breakdown of skin (HCC)    Osteomyelitis (HCC)    Acute on chronic combined systolic and diastolic CHF (congestive heart failure) (HCC)    Acute on chronic heart failure (HCC) 03/23/2021   NSTEMI (non-ST elevated myocardial infarction) (HCC)    Gastroesophageal reflux disease with esophagitis and hemorrhage    Anemia, posthemorrhagic, acute    Acute esophagitis    Gastrointestinal hemorrhage    Necrotizing fasciitis (HCC)    Left leg cellulitis    Diabetic polyneuropathy associated with type 2 diabetes mellitus (HCC)    Severe protein-calorie malnutrition (HCC)    Gangrene of left foot (HCC) 02/25/2021   Type 2 diabetes mellitus (HCC)    Heart failure (HCC)    Dyslipidemia    Diabetic foot ulcer (HCC)    CAD (coronary artery disease)    Cardiomyopathy (HCC)    Coronary artery disease involving native coronary artery of native heart with angina pectoris [facility])      Discharge Diagnoses:  Patient Active Problem List   Diagnosis Date Noted   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   Acute osteomyelitis of left foot (HCC)    Acute exacerbation of CHF (congestive heart failure) (HCC) 04/28/2021   Venous stasis ulcer of right calf limited to breakdown of skin without varicose veins (HCC)    Non-pressure chronic ulcer of other part of left foot limited to breakdown of skin (HCC)     Osteomyelitis (HCC)    Acute on chronic combined systolic and diastolic CHF (congestive heart failure) (HCC)    Acute on chronic heart failure (HCC) 03/23/2021   NSTEMI (non-ST elevated myocardial infarction) (HCC)    Gastroesophageal reflux disease with esophagitis and hemorrhage    Anemia, posthemorrhagic, acute    Acute esophagitis    Gastrointestinal hemorrhage    Necrotizing fasciitis (HCC)    Left leg cellulitis    Diabetic polyneuropathy associated with type 2 diabetes mellitus (HCC)    Severe protein-calorie malnutrition (HCC)    Gangrene of left foot (HCC) 02/25/2021   Type 2 diabetes mellitus (HCC)    Heart failure (HCC)    Dyslipidemia    Diabetic foot ulcer (HCC)    CAD (coronary artery disease)    Cardiomyopathy (HCC)    Coronary artery disease involving native coronary artery of native heart with angina pectoris (HCC)      Discharged Condition: stable  History of Present Illness:     The patient is a 50 year old male who was referred to Dr. Cliffton Asters for consideration of coronary artery bypass grafting.  The patient has multiple cardiac risk factors and comorbidities including congestive heart failure with cardiomyopathy, type 2 diabetes with known chronic left diabetic foot ulcer, dyslipidemia as well as previous history of myocardial infarction.  His original surgery was postponed  due to the infection in the lower extremity.  This has been showing steady healing and he was felt to be ready to proceed with surgical intervention and was admitted this hospitalization for the procedure.  It is noted he did undergo a cardiac MRI previously which showed good viability.  He also underwent a repeat left heart catheterization which redemonstrated the previous findings.  Hospital Course:  The patient was admitted on 05/22/2021 and taken to the operating room at which time he underwent coronary artery bypass grafting x3.  He tolerated the procedure well and was taken to the ICU in  stable condition. He was extubated the evening of surgery without difficulty. Theone Murdoch, a line, and foley were removed early in his post operative course. Chest tubes remained for a few days and once output decreased, were removed. He was on Amiodarone drip (for VT in OR) and Nor Epinephrine drip. He has a history of CKD (stage III).  He developed an elevation in his creatinine level to 2.29.  Heart failure assisted with post op management. He had his 5th ray amputated (had necrotizing fascitis) on 10/05 and is still on antibiotics. He has a history of erosive esophagitis so is on Protonix. He was weaned off the Insulin drip. His pre op HGA1C was 6.5. He will be restarted on Glyburide and Metformin. Epicardial pacing wires were removed without difficulty.  He was able to be weaned off drips,  however his Co ox was low and Milrinone was initiated by the AHF team on 05/25/2021.   He was started on Spironolactone and Digoxin. He was diuresed for volume overload state.    His creatinine did continue to decrease. It was down to 1.63 on 11/01.  Most recent creatinine dated 11/03 was decreased to 1.38. Milrinone drip was stopped 11/01.  On 05/28/2021 his IV Lasix was discontinued and he was transitioned to oral torsemide.  He is additionally continued on his spironolactone and digoxin.  Losartan was added with plans to transition to [facility] on 11/03.  His ventricular ectopy has stopped and continues on oral amiodarone for this.  He does have an expected acute blood loss anemia which did require transfusion during the postoperative period but has stabilized. He was surgically stable for discharge and heart failure in agreement on 11/03. Unfortunately, because of no insurance he was not able to be discharged to SNF. Rolling walker was arranged.  Consults: None  Significant Diagnostic Studies: DG Chest 2 View  Result Date: 05/29/2021 CLINICAL DATA:  CABG. EXAM: CHEST - 2 VIEW COMPARISON:  05/27/2021 FINDINGS: Right  jugular central venous catheter tip remains in the right innominate vein unchanged. No pneumothorax Postop CABG. Left lower lobe atelectasis and small effusion unchanged. Minimal right pleural effusion. No evidence of heart failure or edema. IMPRESSION: Left lower lobe atelectasis and effusion unchanged. Slight right pleural effusion has developed in the interval. No heart failure or pneumothorax. Electronically Signed   By: Marlan Palau M.D.   On: 05/29/2021 08:48   DG Chest 2 View  Result Date: 05/22/2021 CLINICAL DATA:  Pre-admit for coronary artery bypass grafting x3. Patient is in the setting of congestive heart failure. History of coronary artery disease, myocardial infarction and diabetes. EXAM: CHEST - 2 VIEW COMPARISON:  X-ray chest 03/22/2021. FINDINGS: The heart size and mediastinal contours are within normal limits. Both lungs are clear. The visualized skeletal structures are unremarkable. IMPRESSION: No active cardiopulmonary disease. Electronically Signed   By: Signa Kell M.D.   On: 05/22/2021  13:41   DG CHEST PORT 1 VIEW  Result Date: 05/27/2021 CLINICAL DATA:  Shortness of breath 50 year old male, history of pleural effusion. EXAM: PORTABLE CHEST 1 VIEW COMPARISON:  Comparison made with May 24, 2021. FINDINGS: RIGHT IJ vascular sheath again terminating in the proximal superior vena cava. EKG leads projecting over the chest. Post median sternotomy for CABG. Cardiomediastinal contours and hilar structures are stable. Increased LEFT basilar opacity following removal of chest support tubes present on the previous radiograph. Obscured LEFT hemidiaphragm. No pneumothorax. On limited assessment there is no acute skeletal process. IMPRESSION: Increased LEFT basilar opacity following removal of chest support tubes. Findings likely reflect effusion and basilar volume loss. Correlate with any signs of infection. Electronically Signed   By: Donzetta Kohut M.D.   On: 05/27/2021 08:40   DG  Chest Port 1 View  Result Date: 05/24/2021 CLINICAL DATA:  Shortness of breath EXAM: PORTABLE CHEST 1 VIEW COMPARISON:  Previous studies including the examination of 05/23/2021 FINDINGS: Transverse diameter of heart is increased. There is evidence of previous coronary bypass surgery. There is a large caliber central venous catheter with its tip in the region of superior vena cava. There is interval removal of Swan-Ganz catheter seen in the region of main pulmonary artery. There are no signs of alveolar pulmonary edema. Increased density is seen in the left lower lung fields. There are possible surgical drains in the mediastinum. IMPRESSION: Cardiomegaly. Increased density in the left lower lung fields may be due to pleural effusion and possibly underlying atelectasis. This finding has not changed significantly. There are no signs of alveolar pulmonary edema or new focal infiltrates. Electronically Signed   By: Ernie Avena M.D.   On: 05/24/2021 11:43   DG Chest Port 1 View  Result Date: 05/23/2021 CLINICAL DATA:  Status post CABG. EXAM: PORTABLE CHEST 1 VIEW COMPARISON:  05/22/2021 FINDINGS: Swan-Ganz catheter tip is in the expected location of the right ventricular outflow tract. There is a left chest tube in place. No pneumothorax. Decreased aeration to the left lung base is noted which is favored to represent a combination of new left pleural effusion with left lower lobe atelectasis. Right lung is clear. No edema. IMPRESSION: Decreased aeration to the left lung base which is favored to represent a combination of new left pleural effusion and left lower lobe atelectasis. Electronically Signed   By: Signa Kell M.D.   On: 05/23/2021 08:47   DG Chest Port 1 View  Result Date: 05/22/2021 CLINICAL DATA:  Status post coronary artery bypass graft (CABG). EXAM: PORTABLE CHEST 1 VIEW COMPARISON:  Chest radiograph dated 05/20/2021. FINDINGS: An endotracheal tube terminates in the midthoracic  trachea. An enteric tube terminates in the stomach with the side port near the gastroesophageal junction. A right internal jugular swans Ganz catheter tip overlies the main pulmonary artery. Three mediastinal drains are noted. The lungs are clear without pleural effusion. There is suggestion of a trace pneumothorax in the left lung apex which may be postoperative in nature. There is no significant pneumothorax on the right. Median sternotomy wires are noted. IMPRESSION: Suggestion of a trace pneumothorax in the left lung apex may be postoperative. These results will be called to the ordering clinician or representative by the Radiologist Assistant, and communication documented in the PACS or [employer]. Electronically Signed   By: Romona Curls M.D.   On: 05/22/2021 13:29   ECHO INTRAOPERATIVE TEE  Result Date: 05/22/2021  *INTRAOPERATIVE TRANSESOPHAGEAL REPORT *  Patient Name:   [facility]  Kohl Date of Exam: 05/22/2021 Medical Rec #:  [id number]        Height:       75.0 in Accession #:    [id number]       Weight:       240.0 lb Date of Birth:  [birth year]        BSA:          2.37 m Patient Age:    49 years         BP:           108/78 mmHg Patient Gender: M                HR:           91 bpm. Exam Location:  Inpatient Transesophogeal exam was perform intraoperatively during surgical procedure. Patient was closely monitored under general anesthesia during the entirety of examination. Indications:     CABG Performing Phys: [id number] Eliezer Lofts LIGHTFOOT Diagnosing Phys: Lewie Loron MD Complications: No known complications during this procedure. POST-OP IMPRESSIONS _ Left Ventricle: The left ventricle is unchanged from pre-bypass. _ Right Ventricle: The right ventricle appears unchanged from pre-bypass. _ Aorta: The aorta appears unchanged from pre-bypass. _ Left Atrial Appendage: The left atrial appendage appears unchanged from pre-bypass. _ Aortic Valve: The aortic valve appears unchanged from  pre-bypass. _ Mitral Valve: The mitral valve appears unchanged from pre-bypass. _ Tricuspid Valve: The tricuspid valve appears unchanged from pre-bypass. _ Pulmonic Valve: The pulmonic valve appears unchanged from pre-bypass. _ Interatrial Septum: The interatrial septum appears unchanged from pre-bypass. PRE-OP FINDINGS  Left Ventricle: The left ventricle has severely reduced systolic function, with an ejection fraction of 20-30%. The cavity size was severely dilated. Right Ventricle: The right ventricle has normal systolic function. The cavity was normal. There is no increase in right ventricular wall thickness. Left Atrium: Left atrial size was dilated. No left atrial/left atrial appendage thrombus was detected. Right Atrium: Right atrial size was normal in size. Interatrial Septum: No atrial level shunt detected by color flow Doppler. There is no evidence of a patent foramen ovale. Pericardium: There is no evidence of pericardial effusion. Mitral Valve: The mitral valve is normal in structure. Mitral valve regurgitation is moderate by color flow Doppler. There is no evidence of mitral valve vegetation. Tricuspid Valve: The tricuspid valve was normal in structure. Tricuspid valve regurgitation is mild by color flow Doppler. Aortic Valve: The aortic valve is tricuspid Aortic valve regurgitation is mild by color flow Doppler. There is no stenosis of the aortic valve. There is no evidence of aortic valve vegetation. Pulmonic Valve: The pulmonic valve was normal in structure. Pulmonic valve regurgitation is trivial by color flow Doppler. Aorta: The aortic root and ascending aorta are normal in size and structure. There is evidence of plaque in the descending aorta; Grade I, measuring 1-28mm in size. Shunts: There is no evidence of an atrial septal defect.  Lewie Loron MD Electronically signed by Lewie Loron MD Signature Date/Time: 05/22/2021/5:53:42 PM    Final       Treatments: surgery:    05/22/2021 Patient:  Adam James Pre-Op Dx: 3V CAD CHF with EF of 25% DM    Post-op Dx:  same Procedure: CABG X 3.  LIMA LAD, RSVG OM1 and OM3   Endoscopic greater saphenous vein harvest on the right     Surgeon and Role:      * Lightfoot, Eliezer Lofts, MD - Primary    Webb Laws, PA-C -  assisting for vein harvest Discharge Exam: Blood pressure 134/89, pulse 99, temperature 97.9 F (36.6 C), temperature source Oral, resp. rate 19, height  (1.905 m), weight 106.9 kg, SpO2 94 %. Cardiovascular: RRR Pulmonary: Clear to auscultation on right;slightly diminished left base Abdomen: Soft, non tender, bowel sounds present. Extremities: Trace RLE;Boot on left lower extremity  Wounds: Clean and dry.  No erythema or signs of infection.   Discharge Medications:  The patient has been discharged on:   1.Beta Blocker:  Yes [   x]                              No   [   ]                              If No, reason:  2.Ace Inhibitor/ARB: Yes [  x ]                                     No  [    ]                                     If No, reason:  3.Statin:   Yes [  x ]                  No  [   ]                  If No, reason:  4.Ecasa:  Yes  [   x]                  No   [   ]                  If No, reason:  Patient had ACS upon admission:No  Plavix/P2Y12 inhibitor: Yes [   ]                                      No  [  x ]   Allergies as of 05/29/2021       Reactions   Morphine Itching        Medication List     STOP taking these medications    mexiletine 200 MG capsule Commonly known as: MEXITIL   potassium chloride SA 20 MEQ tablet Commonly known as: KLOR-CON       TAKE these medications    amiodarone 200 MG tablet Commonly known as: PACERONE Take 1 tablet (200 mg total) by mouth daily.   aspirin 325 MG EC tablet Take 1 tablet (325 mg total) by mouth daily. What changed:  medication strength how much to take   atorvastatin 80 MG  tablet Commonly known as: LIPITOR Take 1 tablet (80 mg total) by mouth daily.   digoxin 0.125 MG tablet Commonly known as: LANOXIN Take 1 tablet (0.125 mg total) by mouth daily.   Entresto 24-26 MG Generic drug: sacubitril-valsartan Take 1 tablet by mouth 2 (two) times daily.   ferrous sulfate 325 (65 FE) MG tablet Take 1 tablet (325 mg total) by mouth daily with supper. For one  month then stop. If develops constipation, may take stool softener or stop ferrous before one month   glyBURIDE 5 MG tablet Commonly known as: DIABETA Take 1 tablet (5 mg total) by mouth 2 (two) times daily.   metFORMIN 500 MG tablet Commonly known as: GLUCOPHAGE Take 1 tablet (500 mg total) by mouth 2 (two) times daily.   metoprolol succinate 25 MG 24 hr tablet Commonly known as: TOPROL-XL Take 1/2 tablet (12.5 mg total) by mouth at bedtime. What changed:  how much to take when to take this   spironolactone 25 MG tablet Commonly known as: ALDACTONE Take 1 tablet (25 mg total) by mouth daily.   torsemide 20 MG tablet Commonly known as: DEMADEX Take 1 tablet (20 mg total) by mouth daily. What changed: how much to take   traMADol 50 MG tablet Commonly known as: ULTRAM Take 1 tablet (50 mg total) by mouth every 6 (six) hours as needed for moderate pain.   True Metrix Blood Glucose Test test strip Generic drug: glucose blood Use as instructed twice daily   TRUEplus Lancets 28G Misc Use as directed twice daily at 8 am and 10 pm.               Durable Medical Equipment  (From admission, onward)           Start     Ordered   05/28/21 0806  For home use only DME Shower stool  Once        05/28/21 0805            Follow-up Information     Corliss Skains, MD Follow up.   Specialty: Cardiothoracic Surgery Why: Dr. Cliffton Asters will call you on 11/04 at 2;30;please do NOT go to the office Contact information: [address] [place]  [address] [phone]         Laurey Morale, MD. Go on 07/04/2021.   Specialty: Cardiology Why: Appointment time is at 1:40 pm Contact information: [address] [place] [address] [phone]                 Signed: Elenore Rota  05/29/2021, 10:28 AM

## 2021-05-23 NOTE — Progress Notes (Addendum)
TCTS DAILY ICU PROGRESS NOTE                   [address]            [place] [address]          [phone]   1 Day Post-Op Procedure(s) (LRB): CORONARY ARTERY BYPASS GRAFTING (CABG) X 3 USING LEFT INTERNAL MAMMARY ARTERY AND RIGHT ENDOSCOPIC GREATER SAPHENOUS VEIN CONDUITS (N/A) TRANSESOPHAGEAL ECHOCARDIOGRAM (TEE) (N/A) APPLICATION OF CELL SAVER ENDOVEIN HARVEST OF GREATER SAPHENOUS VEIN (Right)  Total Length of Stay:  LOS: 1 day   Subjective: Patient has complaints of incisional pain this am.  Objective: Vital signs in last 24 hours: Temp:  [97.7 F (36.5 C)-99.5 F (37.5 C)] 98.6 F (37 C) (10/28 0645) Pulse Rate:  [61-106] 101 (10/28 0645) Cardiac Rhythm: Normal sinus rhythm (10/27 2000) Resp:  [12-37] 27 (10/28 0645) SpO2:  [97 %-100 %] 97 % (10/28 0645) Arterial Line BP: (74-273)/(30-79) 148/71 (10/28 0645) FiO2 (%):  [50 %] 50 % (10/27 1300) Weight:  [109.6 kg] 109.6 kg (10/28 0645)  Filed Weights   05/22/21 0543 05/23/21 0645  Weight: 108.9 kg 109.6 kg    Weight change: 0.737 kg   Hemodynamic parameters for last 24 hours: PAP: (19-48)/(9-22) 29/13 CVP:  [0 mmHg-9 mmHg] 6 mmHg CO:  [4.3 L/min-6.2 L/min] 6.2 L/min CI:  [1.8 L/min/m2-2.6 L/min/m2] 2.6 L/min/m2  Intake/Output from previous day: 10/27 0701 - 10/28 0700 In: 5862.8 [P.O.:960; I.V.:3355; Blood:250; IV Piggyback:1297.8] Out: 2895 [Urine:2195; Blood:500; Chest Tube:200]  Intake/Output this shift: No intake/output data recorded.  Current Meds: Scheduled Meds:  acetaminophen  1,000 mg Oral Q6H   Or   acetaminophen (TYLENOL) oral liquid 160 mg/5 mL  1,000 mg Per Tube Q6H   aspirin EC  325 mg Oral Daily   Or   aspirin  324 mg Per Tube Daily   bisacodyl  10 mg Oral Daily   Or   bisacodyl  10 mg Rectal Daily   Chlorhexidine Gluconate Cloth  6 each Topical Daily   Chlorhexidine Gluconate Cloth  6 each Topical Q0600   docusate sodium  200 mg Oral Daily   metoprolol  tartrate  12.5 mg Oral BID   Or   metoprolol tartrate  12.5 mg Per Tube BID   [START ON 05/24/2021] pantoprazole  40 mg Oral Daily   sodium chloride flush  10-40 mL Intracatheter Q12H   sodium chloride flush  3 mL Intravenous Q12H   Continuous Infusions:  sodium chloride 20 mL/hr at 05/23/21 0700   sodium chloride     sodium chloride 20 mL/hr at 05/22/21 1250   albumin human     And   sodium chloride     amiodarone 30 mg/hr (05/23/21 0700)    ceFAZolin (ANCEF) IV Stopped (05/23/21 0636)   dexmedetomidine (PRECEDEX) IV infusion Stopped (05/23/21 0610)   DOBUTamine     insulin 1.8 Units/hr (05/23/21 0700)   lactated ringers     lactated ringers     lactated ringers 20 mL/hr at 05/23/21 0700   niCARDipine     nitroGLYCERIN     norepinephrine (LEVOPHED) Adult infusion 3 mcg/min (05/23/21 0700)   phenylephrine (NEO-SYNEPHRINE) Adult infusion     PRN Meds:.sodium chloride, albumin human **AND** sodium chloride, dextrose, fentaNYL (SUBLIMAZE) injection, lactated ringers, metoprolol tartrate, midazolam, ondansetron (ZOFRAN) IV, sodium chloride flush, sodium chloride flush, traMADol  General appearance: alert, cooperative, and no distress Neurologic: intact Heart: RRR, rub with chest tubes  in place Lungs: Diminished bibasilar breath sounds Abdomen: Soft, non tender, sporadic bowel sounds present Extremities: Mild LE edema Wound: Aquacel intact. RLE dressing partially removed-wound is clean and dry.  Lab Results: CBC: Recent Labs    05/22/21 1846 05/23/21 0443  WBC 15.7* 16.1*  HGB 9.3* 8.8*  HCT 28.5* 26.6*  PLT 284 264   BMET:  Recent Labs    05/22/21 1846 05/23/21 0443  NA 135 132*  K 5.3* 4.6  CL 106 104  CO2 22 22  GLUCOSE 130* 105*  BUN 25* 26*  CREATININE 1.69* 1.64*  CALCIUM 8.2* 8.0*    CMET: Lab Results  Component Value Date   WBC 16.1 (H) 05/23/2021   HGB 8.8 (L) 05/23/2021   HCT 26.6 (L) 05/23/2021   PLT 264 05/23/2021   GLUCOSE 105 (H)  05/23/2021   CHOL 167 10/07/2020   TRIG 125 10/07/2020   HDL 36 (L) 10/07/2020   LDLCALC 108 (H) 10/07/2020   ALT 11 05/20/2021   AST 11 (L) 05/20/2021   NA 132 (L) 05/23/2021   K 4.6 05/23/2021   CL 104 05/23/2021   CREATININE 1.64 (H) 05/23/2021   BUN 26 (H) 05/23/2021   CO2 22 05/23/2021   INR 1.4 (H) 05/22/2021   HGBA1C 6.5 (H) 05/20/2021      PT/INR:  Recent Labs    05/22/21 1300  LABPROT 16.8*  INR 1.4*   Radiology: [facility] Chest Port 1 View  Result Date: 05/22/2021 CLINICAL DATA:  Status post coronary artery bypass graft (CABG). EXAM: PORTABLE CHEST 1 VIEW COMPARISON:  Chest radiograph dated 05/20/2021. FINDINGS: An endotracheal tube terminates in the midthoracic trachea. An enteric tube terminates in the stomach with the side port near the gastroesophageal junction. A right internal jugular swans Ganz catheter tip overlies the main pulmonary artery. Three mediastinal drains are noted. The lungs are clear without pleural effusion. There is suggestion of a trace pneumothorax in the left lung apex which may be postoperative in nature. There is no significant pneumothorax on the right. Median sternotomy wires are noted. IMPRESSION: Suggestion of a trace pneumothorax in the left lung apex may be postoperative. These results will be called to the ordering clinician or representative by the Radiologist Assistant, and communication documented in the PACS or [employer]. Electronically Signed   By: Romona Curls M.D.   On: 05/22/2021 13:29   ECHO INTRAOPERATIVE TEE  Result Date: 05/22/2021  *INTRAOPERATIVE TRANSESOPHAGEAL REPORT *  Patient Name:   Adam James Date of Exam: 05/22/2021 Medical Rec #:  [id number]        Height:       75.0 in Accession #:    [id number]       Weight:       240.0 lb Date of Birth:  23-Mar-1971        BSA:          2.37 m Patient Age:    49 years         BP:           108/78 mmHg Patient Gender: M                HR:           91 bpm. Exam Location:   Inpatient Transesophogeal exam was perform intraoperatively during surgical procedure. Patient was closely monitored under general anesthesia during the entirety of examination. Indications:     CABG Performing Phys: [id number] Eliezer Lofts Heiress Williamson Diagnosing Phys: Lewie Loron MD Complications:  No known complications during this procedure. POST-OP IMPRESSIONS _ Left Ventricle: The left ventricle is unchanged from pre-bypass. _ Right Ventricle: The right ventricle appears unchanged from pre-bypass. _ Aorta: The aorta appears unchanged from pre-bypass. _ Left Atrial Appendage: The left atrial appendage appears unchanged from pre-bypass. _ Aortic Valve: The aortic valve appears unchanged from pre-bypass. _ Mitral Valve: The mitral valve appears unchanged from pre-bypass. _ Tricuspid Valve: The tricuspid valve appears unchanged from pre-bypass. _ Pulmonic Valve: The pulmonic valve appears unchanged from pre-bypass. _ Interatrial Septum: The interatrial septum appears unchanged from pre-bypass. PRE-OP FINDINGS  Left Ventricle: The left ventricle has severely reduced systolic function, with an ejection fraction of 20-30%. The cavity size was severely dilated. Right Ventricle: The right ventricle has normal systolic function. The cavity was normal. There is no increase in right ventricular wall thickness. Left Atrium: Left atrial size was dilated. No left atrial/left atrial appendage thrombus was detected. Right Atrium: Right atrial size was normal in size. Interatrial Septum: No atrial level shunt detected by color flow Doppler. There is no evidence of a patent foramen ovale. Pericardium: There is no evidence of pericardial effusion. Mitral Valve: The mitral valve is normal in structure. Mitral valve regurgitation is moderate by color flow Doppler. There is no evidence of mitral valve vegetation. Tricuspid Valve: The tricuspid valve was normal in structure. Tricuspid valve regurgitation is mild by color flow Doppler.  Aortic Valve: The aortic valve is tricuspid Aortic valve regurgitation is mild by color flow Doppler. There is no stenosis of the aortic valve. There is no evidence of aortic valve vegetation. Pulmonic Valve: The pulmonic valve was normal in structure. Pulmonic valve regurgitation is trivial by color flow Doppler. Aorta: The aortic root and ascending aorta are normal in size and structure. There is evidence of plaque in the descending aorta; Grade I, measuring 1-52mm in size. Shunts: There is no evidence of an atrial septal defect.  Lewie Loron MD Electronically signed by Lewie Loron MD Signature Date/Time: 05/22/2021/5:53:42 PM    Final      Assessment/Plan: S/P Procedure(s) (LRB): CORONARY ARTERY BYPASS GRAFTING (CABG) X 3 USING LEFT INTERNAL MAMMARY ARTERY AND RIGHT ENDOSCOPIC GREATER SAPHENOUS VEIN CONDUITS (N/A) TRANSESOPHAGEAL ECHOCARDIOGRAM (TEE) (N/A) APPLICATION OF CELL SAVER ENDOVEIN HARVEST OF GREATER SAPHENOUS VEIN (Right) CV-CO/CI 6.2/2.6. Had VT so on Amiodarone drip. Will transition to oral Amiodarone. SR with HR in the 90's. On Nor Epinephrine drip and Lopressor 12.5 mg bid. Will remove EPW in am. Pulmonary-on 3 liters of oxygen via [place]. Will wean as able over next several days. Chest tubes with 200 cc last 24 hours. CXR this am appears to show ? Trace left pneumothorax, left base atelectasis, and cardiomegaly. Encourage incentive spirometer. Volume overload-per heart failure. Dr. Cliffton Asters recommends no diuresis this am Expected post op blood loss anemia-H and H this am slightly decreased to 8.8 and 26.6 5. DM-CBGs 123/106/113. Pre op HGA1C 6.5. Wean off Insulin and start scheduled Insulin. Will restart Glyburide and Metformin closer/at discharge. 6. CKD-creatinine this am slightly decreased to 1.64. Chronic Kidney Disease   Stage I     GFR >90  Stage II    GFR 60-89  Stage IIIA GFR 45-59  Stage IIIB GFR 30-44  Stage IV   GFR 15-29  Stage V    GFR  <15  Lab Results   Component Value Date   CREATININE 1.64 (H) 05/23/2021   Estimated Creatinine Clearance: 73.6 mL/min (A) (by C-G formula based on SCr of 1.64 mg/dL (  H)).  7. Remove a line and foley;please see progression orders   Donielle Margaretann Loveless PA-C 05/23/2021 7:10 AM   Agree with above. Overall doing well.  Cloris Flippo Keane Scrape

## 2021-05-24 ENCOUNTER — Inpatient Hospital Stay (HOSPITAL_COMMUNITY): Payer: Self-pay

## 2021-05-24 DIAGNOSIS — R739 Hyperglycemia, unspecified: Secondary | ICD-10-CM

## 2021-05-24 LAB — CBC
HCT: 24.8 % — ABNORMAL LOW (ref 39.0–52.0)
Hemoglobin: 8.1 g/dL — ABNORMAL LOW (ref 13.0–17.0)
MCH: 29.2 pg (ref 26.0–34.0)
MCHC: 32.7 g/dL (ref 30.0–36.0)
MCV: 89.5 fL (ref 80.0–100.0)
Platelets: 200 10*3/uL (ref 150–400)
RBC: 2.77 MIL/uL — ABNORMAL LOW (ref 4.22–5.81)
RDW: 14.1 % (ref 11.5–15.5)
WBC: 11.6 10*3/uL — ABNORMAL HIGH (ref 4.0–10.5)
nRBC: 0 % (ref 0.0–0.2)

## 2021-05-24 LAB — BPAM RBC
Blood Product Expiration Date: 202211032359
Blood Product Expiration Date: 202211052359
Blood Product Expiration Date: 202211052359
Blood Product Expiration Date: 202211072359
Blood Product Expiration Date: 202211082359
Blood Product Expiration Date: 202211082359
ISSUE DATE / TIME: 202210270800
ISSUE DATE / TIME: 202210270800
ISSUE DATE / TIME: 202210270800
ISSUE DATE / TIME: 202210270800
Unit Type and Rh: 600
Unit Type and Rh: 600
Unit Type and Rh: 600
Unit Type and Rh: 600
Unit Type and Rh: 600
Unit Type and Rh: 600

## 2021-05-24 LAB — TYPE AND SCREEN
ABO/RH(D): A NEG
Antibody Screen: NEGATIVE
Unit division: 0
Unit division: 0
Unit division: 0
Unit division: 0
Unit division: 0
Unit division: 0

## 2021-05-24 LAB — GLUCOSE, CAPILLARY
Glucose-Capillary: 129 mg/dL — ABNORMAL HIGH (ref 70–99)
Glucose-Capillary: 142 mg/dL — ABNORMAL HIGH (ref 70–99)
Glucose-Capillary: 149 mg/dL — ABNORMAL HIGH (ref 70–99)
Glucose-Capillary: 173 mg/dL — ABNORMAL HIGH (ref 70–99)
Glucose-Capillary: 204 mg/dL — ABNORMAL HIGH (ref 70–99)
Glucose-Capillary: 210 mg/dL — ABNORMAL HIGH (ref 70–99)
Glucose-Capillary: 99 mg/dL (ref 70–99)

## 2021-05-24 LAB — COOXEMETRY PANEL
Carboxyhemoglobin: 1.4 % (ref 0.5–1.5)
Carboxyhemoglobin: 1.1 % (ref 0.5–1.5)
Methemoglobin: 1 % (ref 0.0–1.5)
Methemoglobin: 0.9 % (ref 0.0–1.5)
O2 Saturation: 49.2 %
O2 Saturation: 54 %
Total hemoglobin: 9.7 g/dL — ABNORMAL LOW (ref 12.0–16.0)
Total hemoglobin: 10.6 g/dL — ABNORMAL LOW (ref 12.0–16.0)

## 2021-05-24 LAB — BASIC METABOLIC PANEL
Anion gap: 9 (ref 5–15)
BUN: 30 mg/dL — ABNORMAL HIGH (ref 6–20)
CO2: 23 mmol/L (ref 22–32)
Calcium: 8.3 mg/dL — ABNORMAL LOW (ref 8.9–10.3)
Chloride: 97 mmol/L — ABNORMAL LOW (ref 98–111)
Creatinine, Ser: 2.13 mg/dL — ABNORMAL HIGH (ref 0.61–1.24)
GFR, Estimated: 37 mL/min — ABNORMAL LOW (ref >60)
Glucose, Bld: 159 mg/dL — ABNORMAL HIGH (ref 70–99)
Potassium: 4.7 mmol/L (ref 3.5–5.1)
Sodium: 129 mmol/L — ABNORMAL LOW (ref 135–145)

## 2021-05-24 IMAGING — DX DG CHEST 1V PORT
1 series · 1 of 1 positions shown · non-contrast
Comparison: Previous studies including the examination of
[DATE]

CLINICAL DATA: Shortness of breath

EXAM:
PORTABLE CHEST 1 VIEW

[chest ap]
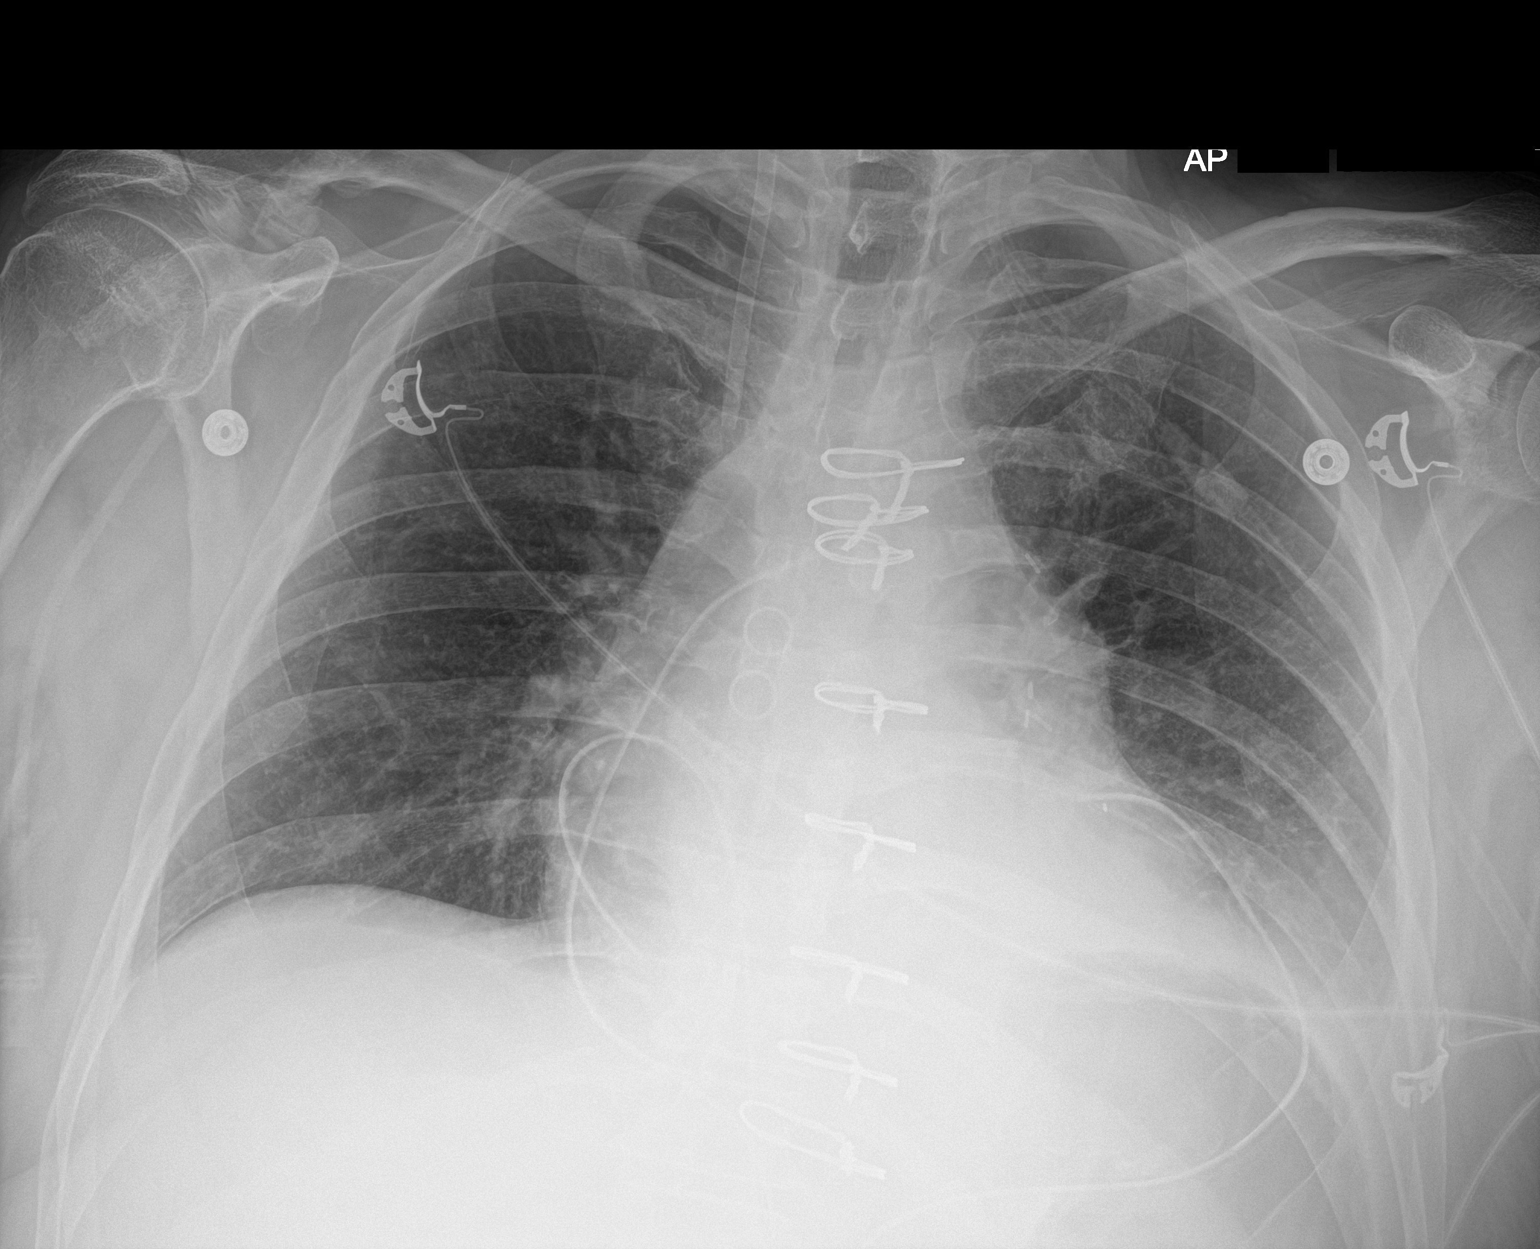

[1 of 1 positions shown; findings below may reference images not displayed]

FINDINGS: Transverse diameter of heart is increased. There is evidence of
previous coronary bypass surgery. There is a large caliber central
venous catheter with its tip in the region of superior vena cava.
There is interval removal of Swan-Ganz catheter seen in the region
of main pulmonary artery. There are no signs of alveolar pulmonary
edema. Increased density is seen in the left lower lung fields.
There are possible surgical drains in the mediastinum.
IMPRESSION: Cardiomegaly. Increased density in the left lower lung fields may be
due to pleural effusion and possibly underlying atelectasis. This
finding has not changed significantly. There are no signs of
alveolar pulmonary edema or new focal infiltrates.

## 2021-05-24 MED ORDER — FUROSEMIDE 10 MG/ML IJ SOLN
40.0000 mg | Freq: Once | INTRAMUSCULAR | Status: AC
  Administered 2021-05-24: 40 mg via INTRAVENOUS
  Filled 2021-05-24: qty 4

## 2021-05-24 MED ORDER — FERROUS SULFATE 325 (65 FE) MG PO TABS
325.0000 mg | ORAL_TABLET | Freq: Every day | ORAL | Status: DC
  Administered 2021-05-24 – 2021-05-27 (×4): 325 mg via ORAL
  Filled 2021-05-24 (×4): qty 1

## 2021-05-24 MED ORDER — POLYETHYLENE GLYCOL 3350 17 G PO PACK
17.0000 g | PACK | Freq: Every day | ORAL | Status: DC
  Filled 2021-05-24 (×3): qty 1

## 2021-05-24 MED ORDER — INSULIN DETEMIR 100 UNIT/ML ~~LOC~~ SOLN
8.0000 [IU] | Freq: Every day | SUBCUTANEOUS | Status: DC
  Administered 2021-05-24 – 2021-05-25 (×2): 8 [IU] via SUBCUTANEOUS
  Filled 2021-05-24 (×2): qty 0.08

## 2021-05-24 MED ORDER — INSULIN ASPART 100 UNIT/ML IJ SOLN
2.0000 [IU] | Freq: Three times a day (TID) | INTRAMUSCULAR | Status: DC
  Administered 2021-05-24 – 2021-05-29 (×13): 2 [IU] via SUBCUTANEOUS

## 2021-05-24 NOTE — Plan of Care (Signed)
  Problem: Clinical Measurements: Goal: Ability to maintain clinical measurements within normal limits will improve Outcome: Progressing Goal: Will remain free from infection Outcome: Progressing Goal: Diagnostic test results will improve Outcome: Progressing Goal: Respiratory complications will improve Outcome: Progressing Goal: Cardiovascular complication will be avoided Outcome: Progressing   Problem: Health Behavior/Discharge Planning: Goal: Ability to manage health-related needs will improve Outcome: Progressing   Problem: Elimination: Goal: Will not experience complications related to bowel motility Outcome: Progressing Goal: Will not experience complications related to urinary retention Outcome: Progressing   

## 2021-05-24 NOTE — Progress Notes (Signed)
      [address]       [place] [address]             [phone]                 2 Days Post-Op Procedure(s) (LRB): CORONARY ARTERY BYPASS GRAFTING (CABG) X 3 USING LEFT INTERNAL MAMMARY ARTERY AND RIGHT ENDOSCOPIC GREATER SAPHENOUS VEIN CONDUITS (N/A) TRANSESOPHAGEAL ECHOCARDIOGRAM (TEE) (N/A) APPLICATION OF CELL SAVER ENDOVEIN HARVEST OF GREATER SAPHENOUS VEIN (Right)   Events: No events _______________________________________________________________ Vitals: BP 99/65   Pulse 89   Temp 97.7 F (36.5 C) (Oral)   Resp 12   Ht 6\' 3"  (1.905 m)   Wt 110.9 kg   SpO2 91%   BMI 30.56 kg/m  Filed Weights   05/22/21 0543 05/23/21 0645 05/24/21 0600  Weight: 108.9 kg 109.6 kg 110.9 kg     - Neuro: alert NAD  - Cardiovascular: sinus  Drips: none.   CVP:  [10 mmHg] 10 mmHg  - Pulm: EWOB    ABG    Component Value Date/Time   PHART 7.348 (L) 05/22/2021 1833   PCO2ART 43.4 05/22/2021 1833   PO2ART 147 (H) 05/22/2021 1833   HCO3 23.7 05/22/2021 1833   TCO2 25 05/22/2021 1833   ACIDBASEDEF 2.0 05/22/2021 1833   O2SAT 54.0 05/24/2021 0939    - Abd: ND - Extremity: warm  .Intake/Output      10/28 0701 10/29 0700 10/29 0701 10/30 0700   P.O. 390    I.V. (mL/kg) 589.7 (5.3)    Blood     IV Piggyback 100    Total Intake(mL/kg) 1079.7 (9.7)    Urine (mL/kg/hr) 810 (0.3)    Blood     Chest Tube 140    Total Output 950    Net +129.7            _______________________________________________________________ Labs: CBC Latest Ref Rng & Units 05/24/2021 05/23/2021 05/23/2021  WBC 4.0 - 10.5 K/uL 11.6(H) 14.5(H) 16.1(H)  Hemoglobin 13.0 - 17.0 g/dL 8.1(L) 8.4(L) 8.8(L)  Hematocrit 39.0 - 52.0 % 24.8(L) 26.1(L) 26.6(L)  Platelets 150 - 400 K/uL 200 219 264   CMP Latest Ref Rng & Units 05/24/2021 05/23/2021 05/23/2021  Glucose 70 - 99 mg/dL 05/25/2021) 341(P) 379(K)  BUN 6 - 20 mg/dL 240(X) 73(Z) 32(D)  Creatinine 0.61 - 1.24 mg/dL 92(E) 2.68(T)  4.19(Q)  Sodium 135 - 145 mmol/L 129(L) 125(L) 132(L)  Potassium 3.5 - 5.1 mmol/L 4.7 4.9 4.6  Chloride 98 - 111 mmol/L 97(L) 95(L) 104  CO2 22 - 32 mmol/L 23 22 22   Calcium 8.9 - 10.3 mg/dL 8.3(L) 7.8(L) 8.0(L)  Total Protein 6.5 - 8.1 g/dL - - -  Total Bilirubin 0.3 - 1.2 mg/dL - - -  Alkaline Phos 38 - 126 U/L - - -  AST 15 - 41 U/L - - -  ALT 0 - 44 U/L - - -    CXR: stable  _______________________________________________________________  Assessment and Plan: POD 2 s/p CABG  Neuro: pain controlled CV: off all gtts.  Will remove wires Pulm: will remove CTs Renal: creat up.  Will continue to follow GI: on diet Heme: stable ID: [email or web address] Endo: SSI Dispo: continue ICU care for renal monitoring   2.22(L 05/24/2021 11:36 AM

## 2021-05-24 NOTE — Progress Notes (Signed)
PT Cancellation Note  Patient Details Name: Adam James MRN: [id number] DOB: 06/08/1971   Cancelled Treatment:    Reason Eval/Treat Not Completed: Patient declined, no reason specified.  Pt reports getting nauseated every time he moves.  Pt defers evaluation today. 05/24/2021  Jacinto Halim., PT Acute Rehabilitation Services [phone]  (pager) [phone]  (office)   Adam James 05/24/2021, 4:28 PM

## 2021-05-24 NOTE — Progress Notes (Signed)
NAME:  Adam James, MRN:  [id number], DOB:  Dec 04, 1970, LOS: 2 ADMISSION DATE:  05/22/2021, CONSULTATION DATE:  05/23/21 REFERRING MD:  Cliffton Asters, CHIEF COMPLAINT:  post-CABG   History of Present Illness:  Adam James is a 50 year old gentleman with a history of chronic heart failure and coronary artery disease who was admitted on 05/22/21 for CABG.  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  He has a history of tobacco abuse, hypertension, diabetes.  He has biventricular, left greater than right cardiomyopathy due to ischemia.  Cardiac MRI demonstrated myocardial viability.  He underwent three-vessel CABG with LIMA to LAD, R SVG to OM1 and OM 3.  With greater saphenous vein harvest on the right.  EBL 500cc, 2 units RBCs were administered intraoperatively.  Transitioning off bypass he had episodes of ventricular tachycardia, status post loading with amiodarone.  He was extubated per postop vent weaning protocol. Today he complaints of chest pain with movement and coughing but denies shortness of breath.   Pertinent  Medical History  CAD CKD Diabetic foot ulcer Type 2 diabetes Anemia Hyperlipidemia  Significant Hospital Events: Including procedures, antibiotic start and stop dates in addition to other pertinent events   10/27 three-vessel CABG, extubated postoperatively 10/28 removing arterial line and Foley catheter 10/29 weaned off NE, borderline coox  Interim History / Subjective:  He complains of nausea when he moves and hiccups.  Objective   Blood pressure 99/65, pulse 89, temperature 97.7 F (36.5 C), temperature source Oral, resp. rate 12, height 6\' 3"  (1.905 m), weight 110.9 kg, SpO2 91 %. CVP:  [10 mmHg] 10 mmHg      Intake/Output Summary (Last 24 hours) at 05/24/2021 1119 Last data filed at 05/24/2021 0400 Gross per 24 hour  Intake 646.53 ml   Output 710 ml  Net -63.47 ml    Filed Weights   05/22/21 0543 05/23/21 0645 05/24/21 0600  Weight: 108.9 kg 109.6 kg 110.9 kg    Examination: General: chronically ill appearing man lying in bed in NAD HENT: [name], eyes anicteric Lungs: breathing comfortably on [place], CTAB, breathing comfortably Cardiovascular: S1S2, RRR, sternal dressing in place Abdomen: soft, NT, ND. Visibly having nausea and hiccups after sitting up and repositioning in bed. Extremities: left leg bandaged. No peripheral edema, no cyanosis or clubbing. Neuro: awake, alert, moving all extremities Derm: warm, dry  I/O +130cc, net +3.1 L for admission  Coox  49>54% CXR personally reviewed> left effusion, left pleural tube and mediastinal drain Na+ 129 BUN 30 Cr 2.13 WBC 11.6 H/H 8.1/24.8   Resolved Hospital Problem list      Assessment & Plan:  CAD, acute on chronic HFrEF due to ischemic cardiomyopathy Ventricular tachycardia postoperatively - daily aspirin and statin - post-op care per TCTS. Pulling chest tube and mediastinal drain. Sternal precautions. -ongoing management per cardiology - electrolyte repletion per protocol - Switching IV to enteral amiodarone --lasix today - Con't digoxin -Tele monitoring -Metoprolol on hold; hopefully will be able to start GDMT in the coming days -mobilize as able  Acute respiratory failure with hypoxia - supplemental O2 as required to maintain SpO2 >90% -pulmonary hygiene, OOB mobility  AKI on CKD IIIb -strict I/O -renally dose meds, avoid nephrotoxic meds -con't to monitor  Hyponatremia, concern for hypervolemic hyponatremia -diuresis -con't to monitor  Chronic iron deficiency anemia, likely also component of chronic iron deficiency -transfuse for Hb <7 or hemodynamically  significant bleeding - daily CBC - Adding enteral iron supplements BIDAC. Adding miralax daily to prevent constipation.  DM2 with uncontrolled hyperglycemia --levemir 8 units  daily -adding 2 units TIDAC -SSI PRN -goal BG <180  History of GI bleed with erosive esophagitis - con't PPI  Left leg diabetic foot infection-necrotizing fasciitis of the left leg in August 2022 Readmission for osteomyelitis, status post fifth ray amputation on the left foot in October 2022 - Completed antibiotics on 05/03/2021 -Has been walking with boot.   Best Practice (right click and "Reselect all SmartList Selections" daily)   Diet/type: Regular consistency (see orders) DVT prophylaxis: SCD GI prophylaxis: PPI Lines: Central line and yes and it is still needed Foley:  Yes, and it is no longer needed and removal ordered  Code Status:  full code Last date of multidisciplinary goals of care discussion [ ]   Labs   CBC: Recent Labs  Lab 05/22/21 1300 05/22/21 1302 05/22/21 1833 05/22/21 1846 05/23/21 0443 05/23/21 1709 05/24/21 0454  WBC 17.7*  --   --  15.7* 16.1* 14.5* 11.6*  HGB 8.7*   < > 8.8* 9.3* 8.8* 8.4* 8.1*  HCT 27.4*   < > 26.0* 28.5* 26.6* 26.1* 24.8*  MCV 89.3  --   --  88.0 87.2 90.0 89.5  PLT 220  --   --  284 264 219 200   < > = values in this interval not displayed.     Basic Metabolic Panel: Recent Labs  Lab 05/20/21 1000 05/22/21 0806 05/22/21 1152 05/22/21 1302 05/22/21 1833 05/22/21 1846 05/23/21 0443 05/23/21 1709 05/24/21 0454  NA 135   < > 138   < > 136 135 132* 125* 129*  K 4.0   < > 5.0   < > 5.4* 5.3* 4.6 4.9 4.7  CL 104   < > 103  --   --  106 104 95* 97*  CO2 21*  --   --   --   --  22 22 22 23   GLUCOSE 167*   < > 148*  --   --  130* 105* 183* 159*  BUN 23*   < > 21*  --   --  25* 26* 28* 30*  CREATININE 1.49*   < > 1.40*  --   --  1.69* 1.64* 1.92* 2.13*  CALCIUM 8.3*  --   --   --   --  8.2* 8.0* 7.8* 8.3*  MG  --   --   --   --   --  3.2* 2.9* 2.7*  --    < > = values in this interval not displayed.    GFR: Estimated Creatinine Clearance: 57.1 mL/min (A) (by C-G formula based on SCr of 2.13 mg/dL (H)). Recent Labs   Lab 05/22/21 1846 05/23/21 0443 05/23/21 1709 05/24/21 0454  WBC 15.7* 16.1* 14.5* 11.6*     Liver Function Tests: Recent Labs  Lab 05/20/21 1000  AST 11*  ALT 11  ALKPHOS 53  BILITOT 0.7  PROT 6.3*  ALBUMIN 2.6*    No results for input(s): LIPASE, AMYLASE in the last 168 hours. No results for input(s): AMMONIA in the last 168 hours.  ABG    Component Value Date/Time   PHART 7.348 (L) 05/22/2021 1833   PCO2ART 43.4 05/22/2021 1833   PO2ART 147 (H) 05/22/2021 1833   HCO3 23.7 05/22/2021 1833   TCO2 25 05/22/2021 1833   ACIDBASEDEF 2.0 05/22/2021 1833   O2SAT 54.0 05/24/2021 0939  Coagulation Profile: Recent Labs  Lab 05/20/21 1000 05/22/21 1300  INR 1.1 1.4*     Steffanie Dunn, DO 05/24/21 3:16 PM [facility]

## 2021-05-24 NOTE — Progress Notes (Signed)
Patient ID: Adam James, male   DOB: 1971-04-27, 50 y.o.   MRN: [id number]     Advanced Heart Failure Rounding Note  PCP-Cardiologist: Adam Herrlich, MD   Subjective:    10/27: CABG X 3. VT coming off pump s/p defibrillation  No further VT, now on po amiodarone.   [place]wan out, co-ox 49% this morning.  CVP not hooked up.  Weight up about 4 lbs. MAP 70s, off NE now.   Pain improving.  Walked yesterday.   Creatinine up 1.6 => 1.9 => 2.1.    Objective:   Weight Range: 110.9 kg Body mass index is 30.56 kg/m.   Vital Signs:   Temp:  [97.6 F (36.4 C)-98.2 F (36.8 C)] 97.7 F (36.5 C) (10/29 0400) Pulse Rate:  [80-94] 92 (10/29 0800) Resp:  [12-22] 12 (10/29 0800) BP: (83-106)/(56-77) 100/67 (10/29 0800) SpO2:  [91 %-100 %] 91 % (10/29 0800) Arterial Line BP: (94-112)/(51-61) 97/51 (10/28 1600) Weight:  [110.9 kg] 110.9 kg (10/29 0600) Last BM Date: 05/21/21  Weight change: Filed Weights   05/22/21 0543 05/23/21 0645 05/24/21 0600  Weight: 108.9 kg 109.6 kg 110.9 kg    Intake/Output:   Intake/Output Summary (Last 24 hours) at 05/24/2021 0850 Last data filed at 05/24/2021 0400 Gross per 24 hour  Intake 770.55 ml  Output 900 ml  Net -129.45 ml      Physical Exam   General: NAD Neck: JVP 8-9 cm, no thyromegaly or thyroid nodule.  Lungs: Clear to auscultation bilaterally with normal respiratory effort. CV: Nondisplaced PMI.  Heart regular S1/S2, no S3/S4, no murmur.  1+ lower leg edema.  Abdomen: Soft, nontender, no hepatosplenomegaly, no distention.  Skin: Intact without lesions or rashes.  Neurologic: Alert and oriented x 3.  Psych: Normal affect. Extremities: No clubbing or cyanosis.  HEENT: Normal.    Telemetry   NSR 80s, no recurrent VT  Labs    CBC Recent Labs    05/23/21 1709 05/24/21 0454  WBC 14.5* 11.6*  HGB 8.4* 8.1*  HCT 26.1* 24.8*  MCV 90.0 89.5  PLT 219 200   Basic Metabolic Panel Recent Labs    28/41/32 0443 05/23/21 1709  05/24/21 0454  NA 132* 125* 129*  K 4.6 4.9 4.7  CL 104 95* 97*  CO2 22 22 23   GLUCOSE 105* 183* 159*  BUN 26* 28* 30*  CREATININE 1.64* 1.92* 2.13*  CALCIUM 8.0* 7.8* 8.3*  MG 2.9* 2.7*  --    Liver Function Tests No results for input(s): AST, ALT, ALKPHOS, BILITOT, PROT, ALBUMIN in the last 72 hours.  No results for input(s): LIPASE, AMYLASE in the last 72 hours. Cardiac Enzymes No results for input(s): CKTOTAL, CKMB, CKMBINDEX, TROPONINI in the last 72 hours.  BNP: BNP (last 3 results) Recent Labs    03/23/21 0045 04/25/21 1505 04/28/21 1047  BNP 1,129.9* 1,691.7* 1,072.2*    ProBNP (last 3 results) Recent Labs    10/07/20 1004  PROBNP 468*     D-Dimer No results for input(s): DDIMER in the last 72 hours. Hemoglobin A1C No results for input(s): HGBA1C in the last 72 hours.  Fasting Lipid Panel No results for input(s): CHOL, HDL, LDLCALC, TRIG, CHOLHDL, LDLDIRECT in the last 72 hours. Thyroid Function Tests No results for input(s): TSH, T4TOTAL, T3FREE, THYROIDAB in the last 72 hours.  Invalid input(s): FREET3  Other results:   Imaging    No results found.   Medications:     Scheduled Medications:  acetaminophen  1,000  mg Oral Q6H   Or   acetaminophen (TYLENOL) oral liquid 160 mg/5 mL  1,000 mg Per Tube Q6H   amiodarone  400 mg Oral BID   aspirin EC  325 mg Oral Daily   Or   aspirin  324 mg Per Tube Daily   atorvastatin  80 mg Oral Daily   bisacodyl  10 mg Oral Daily   Or   bisacodyl  10 mg Rectal Daily   Chlorhexidine Gluconate Cloth  6 each Topical Daily   Chlorhexidine Gluconate Cloth  6 each Topical Q0600   docusate sodium  200 mg Oral Daily   furosemide  40 mg Intravenous Once   insulin aspart  0-24 Units Subcutaneous Q4H   pantoprazole  40 mg Oral Daily   sodium chloride flush  10-40 mL Intracatheter Q12H   sodium chloride flush  3 mL Intravenous Q12H    Infusions:  sodium chloride Stopped (05/23/21 0936)   sodium chloride      sodium chloride 20 mL/hr at 05/22/21 1250   albumin human     And   sodium chloride     insulin Stopped (05/23/21 1355)   lactated ringers     lactated ringers     lactated ringers 20 mL/hr at 05/23/21 1600   norepinephrine (LEVOPHED) Adult infusion Stopped (05/23/21 1400)    PRN Medications: sodium chloride, albumin human **AND** sodium chloride, dextrose, fentaNYL (SUBLIMAZE) injection, lactated ringers, metoprolol tartrate, midazolam, ondansetron (ZOFRAN) IV, sodium chloride flush, sodium chloride flush, traMADol    Patient Profile   [name] is a 50 year old with history of CAD s/p PCI/stent 2007 in [place], chronic HFrEF/ICM, CKD Stage IIIa, HTN, hyperlipidemia, DMII, 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    Admitted for scheduled CABG    Assessment/Plan   1. Acute on chronic HFrEF:  With prior concern for low output HF.  Ischemic cardiomyopathy.  ? If frequent PVCs also contributing. Initial echo in 2/22 EF 40-45%.  Echo in 7/22 EF 30-35%.  Echo in 8/22 EF down to 25% with mild RV dysfunction.  Now s/p CABG, off NE.  Co-ox 49% today, weight up 4 lbs, no CVP.  Creatinine higher 2.1.  - Follow CVP.  Will give 1 dose of Lasix 40 mg IV this morning.  - Resend co-ox, would like to avoid inotrope if possible.  - Hold digoxin with rise in creatinine.  2. CAD: LHC in 2/22 with 50% dLM, 70% ramus, 70% pLCx, totally occluded proximal RCA, 90% stenosis ostial to mid LAD, 90% mid LAD, totally occluded distal LAD.  Cardiac MRI in 4/22 showed EF 40% with substantial viable myocardium, only delayed enhancement was subendocardial in the mid to apical inferior wall.  CABG had been planned in 8/22, but patient was admitted left foot diabetic foot infection that progressed to necrotizing fasciitis s/p debridement and IV abx.  Repeat coronary angiography (9/22) showed minimal change from prior study in 2/22. Now s/p CABG x 3 with LIMA-LAD, SVG-OM1,  SVG-OM3.  PDA too small to graft.  - Continue ASA.  - Restart atorvastatin 80 mg daily.  3. Frequent PVCs/VT perioperatively: Continue po amiodarone.  4. AKI on CKD Stage 3: Creatinine up to 2.1 post-op.  Likely a degree of ATN from hypotension peri-op.  Hopefully will stabilize.  Mild volume overload. MAP stable.  - 1 dose IV Lasix as above.  5. H/o GI bleeding: History of erosive esophagitis in 8/22.   - Continue Protonix.  6.  Left leg diabetic foot infection => necrotizing fasciitis in August 2022.  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 Completed abx. 7. DM2  CRITICAL CARE Performed by: Marca Ancona  Total critical care time: 35 minutes  Critical care time was exclusive of separately billable procedures and treating other patients.  Critical care was necessary to treat or prevent imminent or life-threatening deterioration.  Critical care was time spent personally by me on the following activities: development of treatment plan with patient and/or surrogate as well as nursing, discussions with consultants, evaluation of patient's response to treatment, examination of patient, obtaining history from patient or surrogate, ordering and performing treatments and interventions, ordering and review of laboratory studies, ordering and review of radiographic studies, pulse oximetry and re-evaluation of patient's condition.  Marca Ancona 05/24/2021 8:50 AM

## 2021-05-24 NOTE — Progress Notes (Signed)
Patient refused mobilizing to the chair prior to chest drain removal, Dr Cliffton Asters notified. Patient educated regarding benefits of mobilization.   Patient refusing stool softeners and laxatives, as per patient he has history of diarrhea and does not want to move to the when he has a bowel movement because he is in pain.

## 2021-05-25 DIAGNOSIS — E1152 Type 2 diabetes mellitus with diabetic peripheral angiopathy with gangrene: Secondary | ICD-10-CM

## 2021-05-25 DIAGNOSIS — E871 Hypo-osmolality and hyponatremia: Secondary | ICD-10-CM

## 2021-05-25 LAB — GLUCOSE, CAPILLARY
Glucose-Capillary: 109 mg/dL — ABNORMAL HIGH (ref 70–99)
Glucose-Capillary: 138 mg/dL — ABNORMAL HIGH (ref 70–99)
Glucose-Capillary: 189 mg/dL — ABNORMAL HIGH (ref 70–99)
Glucose-Capillary: 180 mg/dL — ABNORMAL HIGH (ref 70–99)
Glucose-Capillary: 113 mg/dL — ABNORMAL HIGH (ref 70–99)
Glucose-Capillary: 159 mg/dL — ABNORMAL HIGH (ref 70–99)

## 2021-05-25 LAB — BASIC METABOLIC PANEL
Anion gap: 8 (ref 5–15)
BUN: 38 mg/dL — ABNORMAL HIGH (ref 6–20)
CO2: 23 mmol/L (ref 22–32)
Calcium: 8.1 mg/dL — ABNORMAL LOW (ref 8.9–10.3)
Chloride: 96 mmol/L — ABNORMAL LOW (ref 98–111)
Creatinine, Ser: 2.29 mg/dL — ABNORMAL HIGH (ref 0.61–1.24)
GFR, Estimated: 34 mL/min — ABNORMAL LOW (ref >60)
Glucose, Bld: 174 mg/dL — ABNORMAL HIGH (ref 70–99)
Potassium: 4.6 mmol/L (ref 3.5–5.1)
Sodium: 127 mmol/L — ABNORMAL LOW (ref 135–145)

## 2021-05-25 LAB — COOXEMETRY PANEL
Carboxyhemoglobin: 1.3 % (ref 0.5–1.5)
Carboxyhemoglobin: 1.5 % (ref 0.5–1.5)
Methemoglobin: 0.9 % (ref 0.0–1.5)
Methemoglobin: 0.8 % (ref 0.0–1.5)
O2 Saturation: 43.3 %
O2 Saturation: 68 %
Total hemoglobin: 7.6 g/dL — ABNORMAL LOW (ref 12.0–16.0)
Total hemoglobin: 7.8 g/dL — ABNORMAL LOW (ref 12.0–16.0)

## 2021-05-25 LAB — CBC
HCT: 23 % — ABNORMAL LOW (ref 39.0–52.0)
Hemoglobin: 7.5 g/dL — ABNORMAL LOW (ref 13.0–17.0)
MCH: 29.4 pg (ref 26.0–34.0)
MCHC: 32.6 g/dL (ref 30.0–36.0)
MCV: 90.2 fL (ref 80.0–100.0)
Platelets: 227 10*3/uL (ref 150–400)
RBC: 2.55 MIL/uL — ABNORMAL LOW (ref 4.22–5.81)
RDW: 13.9 % (ref 11.5–15.5)
WBC: 11.2 10*3/uL — ABNORMAL HIGH (ref 4.0–10.5)
nRBC: 0 % (ref 0.0–0.2)

## 2021-05-25 LAB — PREPARE RBC (CROSSMATCH)

## 2021-05-25 MED ORDER — MILRINONE LACTATE IN DEXTROSE 20-5 MG/100ML-% IV SOLN
0.1250 ug/kg/min | INTRAVENOUS | Status: DC
  Administered 2021-05-25 – 2021-05-26 (×3): 0.25 ug/kg/min via INTRAVENOUS
  Administered 2021-05-27: 0.125 ug/kg/min via INTRAVENOUS
  Filled 2021-05-25 (×4): qty 100

## 2021-05-25 MED ORDER — INSULIN DETEMIR 100 UNIT/ML ~~LOC~~ SOLN
8.0000 [IU] | Freq: Two times a day (BID) | SUBCUTANEOUS | Status: DC
  Administered 2021-05-25 – 2021-05-29 (×8): 8 [IU] via SUBCUTANEOUS
  Filled 2021-05-25 (×10): qty 0.08

## 2021-05-25 MED ORDER — FUROSEMIDE 10 MG/ML IJ SOLN
40.0000 mg | Freq: Two times a day (BID) | INTRAMUSCULAR | Status: DC
  Administered 2021-05-25 (×2): 40 mg via INTRAVENOUS
  Filled 2021-05-25 (×2): qty 4

## 2021-05-25 MED ORDER — MILRINONE LACTATE IN DEXTROSE 20-5 MG/100ML-% IV SOLN: 0.2500 ug/kg/min | INTRAVENOUS | Status: DC

## 2021-05-25 MED ORDER — SODIUM CHLORIDE 0.9% IV SOLUTION: Freq: Once | INTRAVENOUS | Status: AC

## 2021-05-25 NOTE — Progress Notes (Signed)
Patient ID: Adam James, male   DOB: [birth year], 50 y.o.   MRN: [id number]     Advanced Heart Failure Rounding Note  PCP-Cardiologist: Adam Herrlich, MD   Subjective:    10/27: CABG X 3. VT coming off pump s/p defibrillation  No further VT, now on po amiodarone.   MAP stable, off pressors.  Co-ox remains low at 43% this morning.  CVP 11.  Did not get BMET yet.   No complaints this morning.   Creatinine up 1.6 => 1.9 => 2.1 => pending.   Hgb 7.6 off co-ox but full CBC not sent.   Objective:   Weight Range: 111.4 kg Body mass index is 30.7 kg/m.   Vital Signs:   Temp:  [97.6 F (36.4 C)-98.1 F (36.7 C)] 97.8 F (36.6 C) (10/30 0754) Pulse Rate:  [89-100] 98 (10/30 0700) Resp:  [11-24] 18 (10/30 0700) BP: (89-115)/(65-80) 103/70 (10/30 0700) SpO2:  [90 %-94 %] 93 % (10/30 0700) Weight:  [111.4 kg] 111.4 kg (10/30 0500) Last BM Date: 05/21/21  Weight change: Filed Weights   05/23/21 0645 05/24/21 0600 05/25/21 0500  Weight: 109.6 kg 110.9 kg 111.4 kg    Intake/Output:   Intake/Output Summary (Last 24 hours) at 05/25/2021 0836 Last data filed at 05/25/2021 0800 Gross per 24 hour  Intake 470.92 ml  Output 900 ml  Net -429.08 ml      Physical Exam   General: NAD Neck: JVP 10-12 cm, no thyromegaly or thyroid nodule.  Lungs: Clear to auscultation bilaterally with normal respiratory effort. CV: Nondisplaced PMI.  Heart regular S1/S2, no S3/S4, no murmur.  1+ edema to knee on right.  Abdomen: Soft, nontender, no hepatosplenomegaly, no distention.  Skin: Intact without lesions or rashes.  Neurologic: Alert and oriented x 3.  Psych: Normal affect. Extremities: No clubbing or cyanosis. Left leg wrapped.  HEENT: Normal.    Telemetry   NSR 80s, no recurrent VT  Labs    CBC Recent Labs    05/23/21 1709 05/24/21 0454  WBC 14.5* 11.6*  HGB 8.4* 8.1*  HCT 26.1* 24.8*  MCV 90.0 89.5  PLT 219 200   Basic Metabolic Panel Recent Labs     05/23/21 0443 05/23/21 1709 05/24/21 0454  NA 132* 125* 129*  K 4.6 4.9 4.7  CL 104 95* 97*  CO2 22 22 23   GLUCOSE 105* 183* 159*  BUN 26* 28* 30*  CREATININE 1.64* 1.92* 2.13*  CALCIUM 8.0* 7.8* 8.3*  MG 2.9* 2.7*  --    Liver Function Tests No results for input(s): AST, ALT, ALKPHOS, BILITOT, PROT, ALBUMIN in the last 72 hours.  No results for input(s): LIPASE, AMYLASE in the last 72 hours. Cardiac Enzymes No results for input(s): CKTOTAL, CKMB, CKMBINDEX, TROPONINI in the last 72 hours.  BNP: BNP (last 3 results) Recent Labs    03/23/21 0045 04/25/21 1505 04/28/21 1047  BNP 1,129.9* 1,691.7* 1,072.2*    ProBNP (last 3 results) Recent Labs    10/07/20 1004  PROBNP 468*     D-Dimer No results for input(s): DDIMER in the last 72 hours. Hemoglobin A1C No results for input(s): HGBA1C in the last 72 hours.  Fasting Lipid Panel No results for input(s): CHOL, HDL, LDLCALC, TRIG, CHOLHDL, LDLDIRECT in the last 72 hours. Thyroid Function Tests No results for input(s): TSH, T4TOTAL, T3FREE, THYROIDAB in the last 72 hours.  Invalid input(s): FREET3  Other results:   Imaging    No results found.   Medications:  Scheduled Medications:  acetaminophen  1,000 mg Oral Q6H   Or   acetaminophen (TYLENOL) oral liquid 160 mg/5 mL  1,000 mg Per Tube Q6H   amiodarone  400 mg Oral BID   aspirin EC  325 mg Oral Daily   Or   aspirin  324 mg Per Tube Daily   atorvastatin  80 mg Oral Daily   bisacodyl  10 mg Oral Daily   Or   bisacodyl  10 mg Rectal Daily   Chlorhexidine Gluconate Cloth  6 each Topical Daily   Chlorhexidine Gluconate Cloth  6 each Topical Q0600   docusate sodium  200 mg Oral Daily   ferrous sulfate  325 mg Oral Q supper   insulin aspart  0-24 Units Subcutaneous Q4H   insulin aspart  2 Units Subcutaneous TID WC   insulin detemir  8 Units Subcutaneous Daily   pantoprazole  40 mg Oral Daily   polyethylene glycol  17 g Oral Daily   sodium  chloride flush  10-40 mL Intracatheter Q12H   sodium chloride flush  3 mL Intravenous Q12H    Infusions:  sodium chloride Stopped (05/23/21 0936)   sodium chloride     sodium chloride 20 mL/hr at 05/22/21 1250   albumin human     And   sodium chloride     lactated ringers     lactated ringers     lactated ringers Stopped (05/24/21 0003)   milrinone     norepinephrine (LEVOPHED) Adult infusion Stopped (05/23/21 1400)    PRN Medications: sodium chloride, albumin human **AND** sodium chloride, dextrose, fentaNYL (SUBLIMAZE) injection, lactated ringers, metoprolol tartrate, midazolam, ondansetron (ZOFRAN) IV, sodium chloride flush, sodium chloride flush, traMADol    Patient Profile   [name] is a 50 year old with history of CAD s/p PCI/stent 2007 in [place], chronic HFrEF/ICM, CKD Stage IIIa, HTN, hyperlipidemia, DMII, 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    Admitted for scheduled CABG    Assessment/Plan   1. Acute on chronic systolic CHF:  With prior concern for low output HF.  Ischemic cardiomyopathy.  ? If frequent PVCs also contributing. Initial echo in 2/22 EF 40-45%.  Echo in 7/22 EF 30-35%.  Echo in 8/22 EF down to 25% with mild RV dysfunction.  Now s/p CABG, off NE.  Co-ox 43% today, CVP 11.  Creatinine higher 2.1 yesterday, BMET not done yet today. - Stat BMET.   - Lasix 40 mg IV bid today but need BMET as above. - With low co-ox, will start on milrinone 0.25 (anemia may be playing a role with this).  - Hold digoxin with rise in creatinine.  2. CAD: LHC in 2/22 with 50% dLM, 70% ramus, 70% pLCx, totally occluded proximal RCA, 90% stenosis ostial to mid LAD, 90% mid LAD, totally occluded distal LAD.  Cardiac MRI in 4/22 showed EF 40% with substantial viable myocardium, only delayed enhancement was subendocardial in the mid to apical inferior wall.  CABG had been planned in 8/22, but patient was admitted left foot diabetic foot  infection that progressed to necrotizing fasciitis s/p debridement and IV abx.  Repeat coronary angiography (9/22) showed minimal change from prior study in 2/22. Now s/p CABG x 3 with LIMA-LAD, SVG-OM1, SVG-OM3.  PDA too small to graft.  - Continue ASA.  - Restart atorvastatin 80 mg daily.  3. Frequent PVCs/VT perioperatively: Resolved.  Continue po amiodarone.  4. AKI on CKD Stage 3: Creatinine up to 2.1 post-op.  Likely a degree of ATN from hypotension peri-op.  Hopefully will stabilize.  Mild volume overload. MAP stable.  - Need BMET today as above.  5. H/o GI bleeding: History of erosive esophagitis in 8/22.   - Continue Protonix.  6. Left leg diabetic foot infection => necrotizing fasciitis in August 2022.  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 Completed abx. 7. DM2 8. Anemia: post-op, hgb 7.6 off co-ox.  - CBC stat, if confirm hgb < 8, will transfuse 1 unit PRBCs.   CRITICAL CARE Performed by: Marca Ancona  Total critical care time: 35 minutes  Critical care time was exclusive of separately billable procedures and treating other patients.  Critical care was necessary to treat or prevent imminent or life-threatening deterioration.  Critical care was time spent personally by me on the following activities: development of treatment plan with patient and/or surrogate as well as nursing, discussions with consultants, evaluation of patient's response to treatment, examination of patient, obtaining history from patient or surrogate, ordering and performing treatments and interventions, ordering and review of laboratory studies, ordering and review of radiographic studies, pulse oximetry and re-evaluation of patient's condition.  Marca Ancona 05/25/2021 8:36 AM

## 2021-05-25 NOTE — Progress Notes (Signed)
NAME:  Adam James, MRN:  [id number], DOB:  August 24, 1970, LOS: 3 ADMISSION DATE:  05/22/2021, CONSULTATION DATE:  05/23/21 REFERRING MD:  Cliffton Asters, CHIEF COMPLAINT:  post-CABG   History of Present Illness:  Adam James is a 50 year old gentleman with a history of chronic heart failure and coronary artery disease who was admitted on 05/22/21 for CABG.  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  He has a history of tobacco abuse, hypertension, diabetes.  He has biventricular, left greater than right cardiomyopathy due to ischemia.  Cardiac MRI demonstrated myocardial viability.  He underwent three-vessel CABG with LIMA to LAD, R SVG to OM1 and OM 3.  With greater saphenous vein harvest on the right.  EBL 500cc, 2 units RBCs were administered intraoperatively.  Transitioning off bypass he had episodes of ventricular tachycardia, status post loading with amiodarone.  He was extubated per postop vent weaning protocol. Today he complaints of chest pain with movement and coughing but denies shortness of breath.   Pertinent  Medical History  CAD CKD Diabetic foot ulcer Type 2 diabetes Anemia Hyperlipidemia  Significant Hospital Events: Including procedures, antibiotic start and stop dates in addition to other pertinent events   10/27 three-vessel CABG, extubated postoperatively 10/28 removing arterial line and Foley catheter 10/29 weaned off NE, borderline coox 10/30 started on milrinone  Interim History / Subjective:  Nausea better, still hiccups when drinking cold drinks. Getting 1 unit pRBCs today.  Objective   Blood pressure 109/65, pulse 92, temperature 97.8 F (36.6 C), temperature source Oral, resp. rate 11, height 6\' 3"  (1.905 m), weight 111.4 kg, SpO2 95 %. CVP:  [8 mmHg-68 mmHg] 20 mmHg      Intake/Output Summary (Last 24 hours) at 05/25/2021 1159 Last  data filed at 05/25/2021 1100 Gross per 24 hour  Intake 719.27 ml  Output 1100 ml  Net -380.73 ml    Filed Weights   05/23/21 0645 05/24/21 0600 05/25/21 0500  Weight: 109.6 kg 110.9 kg 111.4 kg    Examination: General: chronically ill appearing man sitting up in bed watching TV HENT: [place]/AT, eyes anicteric Lungs: breathing comfortably on [place], no conversational dyspnea. Cardiovascular: S1S2, RRR, medistinal drains in place Abdomen: soft, NT Extremities: LLE bandaged, RLE no significant edema. No clubbing or cyanosis. Neuro: awake and alert, moving all extremities, answering questions appropriately Derm: warm, dry, no rashes. Sternal wound without erythema or drainage  I/O -310cc, net +2.8 L for admission  Coox  49>54%> 43% Na+ 127 BUN 38 Cr 2.29 WBC 11.1 H/H 7.5/23.0   Resolved Hospital Problem list      Assessment & Plan:  CAD, acute on chronic HFrEF due to ischemic cardiomyopathy Ventricular tachycardia postoperatively Cardiogenic shock -starting milrinone; follow up coox significantly improved - daily aspirin and statin - post-op care per TCTS, sternal precautions -ongoing management per cardiology-- adding milrinone, stopping digoxin today - electrolyte repletion per protocol - Switching IV to enteral amiodarone --lasix 40mg  BID -Tele monitoring -cont to hold metoprolol until shock resolved -con't OOB mobility  Acute respiratory failure with hypoxia - diuresis -wean supplemental O2 as able to maintain SpO2 >90% -pulmonary hygiene, OOB mobility  AKI on CKD IIIb, worsening -strict I/Os -renally dose meds, avoid nephrotoxic meds -con't to monitor daily -maintain adequate renal perfusion -goal euvolemia  Hyponatremia, concern for hypervolemic hyponatremia -cont' diuresis -con't to monitor  Chronic iron deficiency anemia, likely also component of  chronic iron deficiency -1 unit pRBCs today to improve O2 carrying capacity -daily CBC - Con't enteral  iron daily  DM2 with uncontrolled hyperglycemia --levemir 8 units -increase to BID -adding 2 units insulin aspart TIDAC -SSI PRN -goal BG <180  History of GI bleed with erosive esophagitis - PPI daily  Left leg diabetic foot infection-necrotizing fasciitis of the left leg in August 2022 Readmission for osteomyelitis, status post fifth ray amputation on the left foot in October 2022 - Completed antibiotics on 10/8 -Has been walking with boot. OOB mobility as able.   Best Practice (right click and "Reselect all SmartList Selections" daily)   Diet/type: Regular consistency (see orders) DVT prophylaxis: SCD GI prophylaxis: PPI Lines: Central line and yes and it is still needed Foley:  N/A Code Status:  full code Last date of multidisciplinary goals of care discussion [ ]   Labs   CBC: Recent Labs  Lab 05/22/21 1846 05/23/21 0443 05/23/21 1709 05/24/21 0454 05/25/21 0845  WBC 15.7* 16.1* 14.5* 11.6* 11.2*  HGB 9.3* 8.8* 8.4* 8.1* 7.5*  HCT 28.5* 26.6* 26.1* 24.8* 23.0*  MCV 88.0 87.2 90.0 89.5 90.2  PLT 284 264 219 200 227     Basic Metabolic Panel: Recent Labs  Lab 05/22/21 1846 05/23/21 0443 05/23/21 1709 05/24/21 0454 05/25/21 0845  NA 135 132* 125* 129* 127*  K 5.3* 4.6 4.9 4.7 4.6  CL 106 104 95* 97* 96*  CO2 22 22 22 23 23   GLUCOSE 130* 105* 183* 159* 174*  BUN 25* 26* 28* 30* 38*  CREATININE 1.69* 1.64* 1.92* 2.13* 2.29*  CALCIUM 8.2* 8.0* 7.8* 8.3* 8.1*  MG 3.2* 2.9* 2.7*  --   --     GFR: Estimated Creatinine Clearance: 53.2 mL/min (A) (by C-G formula based on SCr of 2.29 mg/dL (H)). Recent Labs  Lab 05/23/21 0443 05/23/21 1709 05/24/21 0454 05/25/21 0845  WBC 16.1* 14.5* 11.6* 11.2*     Liver Function Tests: Recent Labs  Lab 05/20/21 1000  AST 11*  ALT 11  ALKPHOS 53  BILITOT 0.7  PROT 6.3*  ALBUMIN 2.6*    No results for input(s): LIPASE, AMYLASE in the last 168 hours. No results for input(s): AMMONIA in the last 168  hours.  ABG    Component Value Date/Time   PHART 7.348 (L) 05/22/2021 1833   PCO2ART 43.4 05/22/2021 1833   PO2ART 147 (H) 05/22/2021 1833   HCO3 23.7 05/22/2021 1833   TCO2 25 05/22/2021 1833   ACIDBASEDEF 2.0 05/22/2021 1833   O2SAT 43.3 05/25/2021 0515      Coagulation Profile: Recent Labs  Lab 05/20/21 1000 05/22/21 1300  INR 1.1 1.4*     05/22/21, DO 05/25/21 11:59 AM [facility]

## 2021-05-25 NOTE — Progress Notes (Signed)
      [address]       [place] [address]             [phone]                 3 Days Post-Op Procedure(s) (LRB): CORONARY ARTERY BYPASS GRAFTING (CABG) X 3 USING LEFT INTERNAL MAMMARY ARTERY AND RIGHT ENDOSCOPIC GREATER SAPHENOUS VEIN CONDUITS (N/A) TRANSESOPHAGEAL ECHOCARDIOGRAM (TEE) (N/A) APPLICATION OF CELL SAVER ENDOVEIN HARVEST OF GREATER SAPHENOUS VEIN (Right)   Events: No events _______________________________________________________________ Vitals: BP 109/65   Pulse 92   Temp 97.8 F (36.6 C) (Oral)   Resp 11   Ht 6\' 3"  (1.905 m)   Wt 111.4 kg   SpO2 95%   BMI 30.70 kg/m  Filed Weights   05/23/21 0645 05/24/21 0600 05/25/21 0500  Weight: 109.6 kg 110.9 kg 111.4 kg     - Neuro: alert NAD  - Cardiovascular: sinus  Drips: Milrinone 0.25 CVP:  [8 mmHg-68 mmHg] 20 mmHg  - Pulm: EWOB    ABG    Component Value Date/Time   PHART 7.348 (L) 05/22/2021 1833   PCO2ART 43.4 05/22/2021 1833   PO2ART 147 (H) 05/22/2021 1833   HCO3 23.7 05/22/2021 1833   TCO2 25 05/22/2021 1833   ACIDBASEDEF 2.0 05/22/2021 1833   O2SAT 43.3 05/25/2021 0515    - Abd: ND - Extremity: warm  .Intake/Output      10/29 0701 10/30 0700 10/30 0701 10/31 0700   P.O. 590 240   I.V. (mL/kg)  39.3 (0.4)   IV Piggyback     Total Intake(mL/kg) 590 (5.3) 279.3 (2.5)   Urine (mL/kg/hr) 900 (0.3) 200 (0.4)   Chest Tube     Total Output 900 200   Net -310 +79.3           _______________________________________________________________ Labs: CBC Latest Ref Rng & Units 05/25/2021 05/24/2021 05/23/2021  WBC 4.0 - 10.5 K/uL 11.2(H) 11.6(H) 14.5(H)  Hemoglobin 13.0 - 17.0 g/dL 7.5(L) 8.1(L) 8.4(L)  Hematocrit 39.0 - 52.0 % 23.0(L) 24.8(L) 26.1(L)  Platelets 150 - 400 K/uL 227 200 219   CMP Latest Ref Rng & Units 05/25/2021 05/24/2021 05/23/2021  Glucose 70 - 99 mg/dL 05/25/2021) 856(D) 149(F)  BUN 6 - 20 mg/dL 026(V) 78(H) 88(F)  Creatinine 0.61 - 1.24 mg/dL  02(D) 7.41(O) 8.78(M)  Sodium 135 - 145 mmol/L 127(L) 129(L) 125(L)  Potassium 3.5 - 5.1 mmol/L 4.6 4.7 4.9  Chloride 98 - 111 mmol/L 96(L) 97(L) 95(L)  CO2 22 - 32 mmol/L 23 23 22   Calcium 8.9 - 10.3 mg/dL 8.1(L) 8.3(L) 7.8(L)  Total Protein 6.5 - 8.1 g/dL - - -  Total Bilirubin 0.3 - 1.2 mg/dL - - -  Alkaline Phos 38 - 126 U/L - - -  AST 15 - 41 U/L - - -  ALT 0 - 44 U/L - - -    CXR: stable  _______________________________________________________________  Assessment and Plan: POD 3 s/p CABG  Neuro: pain controlled CV: On milrinone today.  Will receive 1 unit of packed red blood cells  Pulm: Continue pulmonary hygiene Renal: creat up.  Will continue to follow GI: on diet Heme: stable ID: [email or web address] Endo: SSI Dispo: continue ICU care for renal monitoring   Nykerria Macconnell O Maninder Deboer 05/25/2021 11:54 AM

## 2021-05-25 NOTE — Progress Notes (Signed)
PT Cancellation Note  Patient Details Name: Adam James MRN: [id number] DOB: [birth year]   Cancelled Treatment:    Reason Eval/Treat Not Completed: Patient declined, no reason specified. The pt provided all information regarding prior level of function and mobility, but declined to participate in any mobility this afternoon stating his chest is sore (at surgical site), and that he had walked earlier in the day with the RN. I attempted to educate pt on importance of mobilizing more than once each day and risks of continued bed rest, but this caused slight agitation. Will continue to follow and re-attempt evaluation as patient agreeable.   Vickki Muff, PT, DPT   Acute Rehabilitation Department Pager #: [phone]   Ronnie Derby 05/25/2021, 4:49 PM

## 2021-05-26 DIAGNOSIS — J9601 Acute respiratory failure with hypoxia: Secondary | ICD-10-CM

## 2021-05-26 LAB — GLUCOSE, CAPILLARY
Glucose-Capillary: 120 mg/dL — ABNORMAL HIGH (ref 70–99)
Glucose-Capillary: 168 mg/dL — ABNORMAL HIGH (ref 70–99)
Glucose-Capillary: 112 mg/dL — ABNORMAL HIGH (ref 70–99)
Glucose-Capillary: 108 mg/dL — ABNORMAL HIGH (ref 70–99)
Glucose-Capillary: 159 mg/dL — ABNORMAL HIGH (ref 70–99)
Glucose-Capillary: 172 mg/dL — ABNORMAL HIGH (ref 70–99)

## 2021-05-26 LAB — CBC
HCT: 21.4 % — ABNORMAL LOW (ref 39.0–52.0)
Hemoglobin: 7 g/dL — ABNORMAL LOW (ref 13.0–17.0)
MCH: 28.9 pg (ref 26.0–34.0)
MCHC: 32.7 g/dL (ref 30.0–36.0)
MCV: 88.4 fL (ref 80.0–100.0)
Platelets: 207 10*3/uL (ref 150–400)
RBC: 2.42 MIL/uL — ABNORMAL LOW (ref 4.22–5.81)
RDW: 14.3 % (ref 11.5–15.5)
WBC: 8 10*3/uL (ref 4.0–10.5)
nRBC: 0 % (ref 0.0–0.2)

## 2021-05-26 LAB — BASIC METABOLIC PANEL
Anion gap: 7 (ref 5–15)
BUN: 38 mg/dL — ABNORMAL HIGH (ref 6–20)
CO2: 22 mmol/L (ref 22–32)
Calcium: 7.5 mg/dL — ABNORMAL LOW (ref 8.9–10.3)
Chloride: 99 mmol/L (ref 98–111)
Creatinine, Ser: 1.93 mg/dL — ABNORMAL HIGH (ref 0.61–1.24)
GFR, Estimated: 42 mL/min — ABNORMAL LOW (ref >60)
Glucose, Bld: 128 mg/dL — ABNORMAL HIGH (ref 70–99)
Potassium: 4.1 mmol/L (ref 3.5–5.1)
Sodium: 128 mmol/L — ABNORMAL LOW (ref 135–145)

## 2021-05-26 LAB — COOXEMETRY PANEL
Carboxyhemoglobin: 2 % — ABNORMAL HIGH (ref 0.5–1.5)
Methemoglobin: 0.9 % (ref 0.0–1.5)
O2 Saturation: 76.5 %
Total hemoglobin: 7.2 g/dL — ABNORMAL LOW (ref 12.0–16.0)

## 2021-05-26 LAB — PREPARE RBC (CROSSMATCH)

## 2021-05-26 LAB — MAGNESIUM: Magnesium: 2.4 mg/dL (ref 1.7–2.4)

## 2021-05-26 MED ORDER — ~~LOC~~ CARDIAC SURGERY, PATIENT & FAMILY EDUCATION: Freq: Once | Status: DC

## 2021-05-26 MED ORDER — FUROSEMIDE 10 MG/ML IJ SOLN
60.0000 mg | Freq: Two times a day (BID) | INTRAMUSCULAR | Status: DC
  Administered 2021-05-26 – 2021-05-27 (×4): 60 mg via INTRAVENOUS
  Filled 2021-05-26 (×4): qty 6

## 2021-05-26 MED ORDER — SODIUM CHLORIDE 0.9% FLUSH: 3.0000 mL | INTRAVENOUS | Status: DC | PRN

## 2021-05-26 MED ORDER — SODIUM CHLORIDE 0.9 % IV SOLN: 250.0000 mL | INTRAVENOUS | Status: DC | PRN

## 2021-05-26 MED ORDER — SPIRONOLACTONE 12.5 MG HALF TABLET
12.5000 mg | ORAL_TABLET | Freq: Every day | ORAL | Status: DC
  Administered 2021-05-26: 12.5 mg via ORAL
  Filled 2021-05-26: qty 1

## 2021-05-26 MED ORDER — SODIUM CHLORIDE 0.9% FLUSH
3.0000 mL | Freq: Two times a day (BID) | INTRAVENOUS | Status: DC
  Administered 2021-05-26 – 2021-05-28 (×4): 3 mL via INTRAVENOUS

## 2021-05-26 MED ORDER — SODIUM CHLORIDE 0.9% IV SOLUTION: Freq: Once | INTRAVENOUS | Status: AC

## 2021-05-26 NOTE — Progress Notes (Signed)
Patient ID: [name], male   DOB: June 18, 1971, 50 y.o.   MRN: [id number] Patient ID: Adam James, male   DOB: 1970/11/07, 50 y.o.   MRN: [id number]     Advanced Heart Failure Rounding Note  PCP-Cardiologist: Shirlee More, MD   Subjective:    10/27: CABG X 3. VT coming off pump s/p defibrillation  No further VT, now on po amiodarone.   MAP stable, off pressors.  Co-ox 77% with CVP 10-11 on milrinone 0.25.   No complaints this morning, feeling better.  He has walked in the hall.   Creatinine 1.6 => 1.9 => 2.1 => 1.93   Hgb 7 today. No overt bleeding.   Objective:   Weight Range: 114.4 kg Body mass index is 31.52 kg/m.   Vital Signs:   Temp:  [97.6 F (36.4 C)-98.1 F (36.7 C)] 98.1 F (36.7 C) (10/31 0400) Pulse Rate:  [90-123] 104 (10/31 0700) Resp:  [11-26] 11 (10/31 0500) BP: (92-128)/(57-83) 110/67 (10/31 0700) SpO2:  [91 %-98 %] 98 % (10/31 0700) Weight:  [114.4 kg] 114.4 kg (10/31 0646) Last BM Date: 05/21/21  Weight change: Filed Weights   05/24/21 0600 05/25/21 0500 05/26/21 0646  Weight: 110.9 kg 111.4 kg 114.4 kg    Intake/Output:   Intake/Output Summary (Last 24 hours) at 05/26/2021 0816 Last data filed at 05/26/2021 0700 Gross per 24 hour  Intake 597.3 ml  Output 1425 ml  Net -827.7 ml      Physical Exam   General: NAD Neck: JVP 10-12, no thyromegaly or thyroid nodule.  Lungs: Clear to auscultation bilaterally with normal respiratory effort. CV: Nondisplaced PMI.  Heart regular S1/S2, no S3/S4, no murmur.  1+ edema right lower leg, left lower leg wrapped.  Abdomen: Soft, nontender, no hepatosplenomegaly, no distention.  Skin: Intact without lesions or rashes.  Neurologic: Alert and oriented x 3.  Psych: Normal affect. Extremities: No clubbing or cyanosis.  HEENT: Normal.    Telemetry   NSR around 100, no recurrent VT  Labs    CBC Recent Labs    05/25/21 0845 05/26/21 0451  WBC 11.2* 8.0  HGB 7.5* 7.0*  HCT 23.0*  21.4*  MCV 90.2 88.4  PLT 227 [id number]   Basic Metabolic Panel Recent Labs    05/23/21 1709 05/24/21 0454 05/25/21 0845 05/26/21 0451  NA 125*   < > 127* 128*  K 4.9   < > 4.6 4.1  CL 95*   < > 96* 99  CO2 22   < > 23 22  GLUCOSE 183*   < > 174* 128*  BUN 28*   < > 38* 38*  CREATININE 1.92*   < > 2.29* 1.93*  CALCIUM 7.8*   < > 8.1* 7.5*  MG 2.7*  --   --  2.4   < > = values in this interval not displayed.   Liver Function Tests No results for input(s): AST, ALT, ALKPHOS, BILITOT, PROT, ALBUMIN in the last 72 hours.  No results for input(s): LIPASE, AMYLASE in the last 72 hours. Cardiac Enzymes No results for input(s): CKTOTAL, CKMB, CKMBINDEX, TROPONINI in the last 72 hours.  BNP: BNP (last 3 results) Recent Labs    03/23/21 0045 04/25/21 1505 04/28/21 1047  BNP 1,129.9* 1,691.7* 1,072.2*    ProBNP (last 3 results) Recent Labs    10/07/20 1004  PROBNP 468*     D-Dimer No results for input(s): DDIMER in the last 72 hours. Hemoglobin A1C No results for input(s): HGBA1C in  the last 72 hours.  Fasting Lipid Panel No results for input(s): CHOL, HDL, LDLCALC, TRIG, CHOLHDL, LDLDIRECT in the last 72 hours. Thyroid Function Tests No results for input(s): TSH, T4TOTAL, T3FREE, THYROIDAB in the last 72 hours.  Invalid input(s): FREET3  Other results:   Imaging    No results found.   Medications:     Scheduled Medications:  acetaminophen  1,000 mg Oral Q6H   Or   acetaminophen (TYLENOL) oral liquid 160 mg/5 mL  1,000 mg Per Tube Q6H   amiodarone  400 mg Oral BID   aspirin EC  325 mg Oral Daily   Or   aspirin  324 mg Per Tube Daily   atorvastatin  80 mg Oral Daily   bisacodyl  10 mg Oral Daily   Or   bisacodyl  10 mg Rectal Daily   Chlorhexidine Gluconate Cloth  6 each Topical Daily   Chlorhexidine Gluconate Cloth  6 each Topical Q0600   docusate sodium  200 mg Oral Daily   ferrous sulfate  325 mg Oral Q supper   furosemide  60 mg Intravenous BID    insulin aspart  0-24 Units Subcutaneous Q4H   insulin aspart  2 Units Subcutaneous TID WC   insulin detemir  8 Units Subcutaneous BID   pantoprazole  40 mg Oral Daily   polyethylene glycol  17 g Oral Daily   sodium chloride flush  10-40 mL Intracatheter Q12H   sodium chloride flush  3 mL Intravenous Q12H   spironolactone  12.5 mg Oral Daily    Infusions:  sodium chloride Stopped (05/23/21 0936)   sodium chloride     sodium chloride 20 mL/hr at 05/22/21 1250   albumin human     And   sodium chloride     lactated ringers     lactated ringers     lactated ringers Stopped (05/24/21 0003)   milrinone 0.25 mcg/kg/min (05/26/21 0649)    PRN Medications: sodium chloride, albumin human **AND** sodium chloride, dextrose, fentaNYL (SUBLIMAZE) injection, lactated ringers, metoprolol tartrate, ondansetron (ZOFRAN) IV, sodium chloride flush, sodium chloride flush, traMADol    Patient Profile   Adam James is a 50 year old with history of CAD s/p PCI/stent 2007 in [place], chronic HFrEF/ICM, CKD Stage IIIa, HTN, hyperlipidemia, DMII, 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    Admitted for scheduled CABG    Assessment/Plan   1. Acute on chronic systolic CHF:  With prior concern for low output HF.  Ischemic cardiomyopathy.  ? If frequent PVCs also contributing. Initial echo in 2/22 EF 40-45%.  Echo in 7/22 EF 30-35%.  Echo in 8/22 EF down to 25% with mild RV dysfunction.  Now s/p CABG, off NE but remains on milrinone 0.25.  Co-ox 77% today, CVP 10-11.  Creatinine down to 1.93.  - Lasix 60 mg IV bid today and add spironolactone 12.5 daily.  - Decrease milrinone to 0.125 mcg/kg/min.  - If creatinine continues to trend down, restart digoxin.   2. CAD: LHC in 2/22 with 50% dLM, 70% ramus, 70% pLCx, totally occluded proximal RCA, 90% stenosis ostial to mid LAD, 90% mid LAD, totally occluded distal LAD.  Cardiac MRI in 4/22 showed EF 40% with substantial  viable myocardium, only delayed enhancement was subendocardial in the mid to apical inferior wall.  CABG had been planned in 8/22, but patient was admitted left foot diabetic foot infection that progressed to necrotizing fasciitis s/p debridement and IV abx.  Repeat  coronary angiography (9/22) showed minimal change from prior study in 2/22. Now s/p CABG x 3 with LIMA-LAD, SVG-OM1, SVG-OM3.  PDA too small to graft.  - Continue ASA.  - Atorvastatin 80 mg daily.  3. Frequent PVCs/VT perioperatively: Resolved.  Continue po amiodarone.  4. AKI on CKD Stage 3: Creatinine up to 2.1 post-op.  Likely a degree of ATN from hypotension peri-op.  Creatinine down to 1.93 today.  5. H/o GI bleeding: History of erosive esophagitis in 8/22.   - Continue Protonix.  6. Left leg diabetic foot infection => necrotizing fasciitis in August 2022.  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 Completed abx. 7. DM2 8. Anemia: post-op, hgb 7.0 today with no overt bleeding.   - Will transfuse 1 unit PRBCs.   OK for step down.   CRITICAL CARE Performed by: Loralie Champagne  Total critical care time: 35 minutes  Critical care time was exclusive of separately billable procedures and treating other patients.  Critical care was necessary to treat or prevent imminent or life-threatening deterioration.  Critical care was time spent personally by me on the following activities: development of treatment plan with patient and/or surrogate as well as nursing, discussions with consultants, evaluation of patient's response to treatment, examination of patient, obtaining history from patient or surrogate, ordering and performing treatments and interventions, ordering and review of laboratory studies, ordering and review of radiographic studies, pulse oximetry and re-evaluation of patient's condition.  Loralie Champagne 05/26/2021 8:16 AM

## 2021-05-26 NOTE — Progress Notes (Signed)
NAME:  Adam James, MRN:  [id number], DOB:  1970/12/08, LOS: 4 ADMISSION DATE:  05/22/2021, CONSULTATION DATE:  05/23/21 REFERRING MD:  Kipp Brood, CHIEF COMPLAINT:  post-CABG   History of Present Illness:  Adam James is a 50 year old gentleman with a history of chronic heart failure and coronary artery disease who was admitted on 05/22/21 for CABG.  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  He has a history of tobacco abuse, hypertension, diabetes.  He has biventricular, left greater than right cardiomyopathy due to ischemia.  Cardiac MRI demonstrated myocardial viability.  He underwent three-vessel CABG with LIMA to LAD, R SVG to OM1 and OM 3.  With greater saphenous vein harvest on the right.  EBL 500cc, 2 units RBCs were administered intraoperatively.  Transitioning off bypass he had episodes of ventricular tachycardia, status post loading with amiodarone.  He was extubated per postop vent weaning protocol. Today he complaints of chest pain with movement and coughing but denies shortness of breath.   Pertinent  Medical History  CAD CKD Diabetic foot ulcer Type 2 diabetes Anemia Hyperlipidemia  Significant Hospital Events: Including procedures, antibiotic start and stop dates in addition to other pertinent events   10/27 three-vessel CABG, extubated postoperatively 10/28 removing arterial line and Foley catheter 10/29 weaned off NE, borderline coox 10/30 started on milrinone 10/31 hgb 7. Getting 1 PRBC Stable for progressive   Interim History / Subjective:  1 PRBC ordered this morning for hgb 7   Objective   Blood pressure 110/67, pulse (!) 104, temperature 98.1 F (36.7 C), temperature source Oral, resp. rate 11, height 6\' 3"  (1.905 m), weight 114.4 kg, SpO2 98 %. CVP:  [8 mmHg-21 mmHg] 8 mmHg      Intake/Output Summary (Last 24 hours) at 05/26/2021  0733 Last data filed at 05/26/2021 0600 Gross per 24 hour  Intake 611.51 ml  Output 1525 ml  Net -913.49 ml   Filed Weights   05/24/21 0600 05/25/21 0500 05/26/21 0646  Weight: 110.9 kg 111.4 kg 114.4 kg    Examination: General: WDWN ill appearing middle aged M reclined in bed NAD  HENT: NCAT anicteric sclera  Lungs: CTAb symmetrical chest expansion  Cardiovascular: Ss1s2 no rgm  Abdomen: soft, NT Extremities: no acute joint deformity. BLE edema. LLE boot Neuro: AAOx4 following commands  Derm: sternal wound well approximated c/d    Resolved Hospital Problem list     Acute respiratory failure with hypoxia   Assessment & Plan:   CAD Acute on chronic HFrEF ICM Post op VT Cardiogenic Shock -- improving  P -post op per CVTS -Milrinone -ASA, statin  -BID lasix 60mg  and spiro  -amio   AKI on CKD IIIb Hyponatremia, likely hypervolemic hyponatremia  P -strict I/Os -renally dose meds, avoid nephrotoxic meds -con't to monitor daily -maintain adequate renal perfusion  Acute anemia on chronic iron deficiency anemia   P -cont Fe  -Hgb goal > 8 -1 PRBC 10/31  DM2 with hyperglycemia P -q4hr rSSI -TID with meals 2unit novolog -Levemir 8units BID   Hx GIB Hx erosive esophagitis  P - PPI daily  Left leg diabetic foot infection-necrotizing fasciitis of the left leg in August 2022 Readmission for osteomyelitis, status post fifth ray amputation on the left foot in October 2022 - Completed antibiotics on 10/8 -ambulating, cont PT     Patient to transfer out of ICU 10/31 and to  progressive. CCM with will sign off, please let us know if we can be of further assistance or if pt status changes   Best Practice (right click and "Reselect all SmartList Selections" daily)   Diet/type: Regular consistency (see orders) DVT prophylaxis: SCD GI prophylaxis: PPI Lines: Central line and yes and it is still needed Foley:  N/A Code Status:  full code Last date of  multidisciplinary goals of care discussion [ per primary ]  Labs   CBC: Recent Labs  Lab 05/23/21 0443 05/23/21 1709 05/24/21 0454 05/25/21 0845 05/26/21 0451  WBC 16.1* 14.5* 11.6* 11.2* 8.0  HGB 8.8* 8.4* 8.1* 7.5* 7.0*  HCT 26.6* 26.1* 24.8* 23.0* 21.4*  MCV 87.2 90.0 89.5 90.2 88.4  PLT 264 219 200 227 207    Basic Metabolic Panel: Recent Labs  Lab 05/22/21 1846 05/23/21 0443 05/23/21 1709 05/24/21 0454 05/25/21 0845 05/26/21 0451  NA 135 132* 125* 129* 127* 128*  K 5.3* 4.6 4.9 4.7 4.6 4.1  CL 106 104 95* 97* 96* 99  CO2 22 22 22 23 23 22   GLUCOSE 130* 105* 183* 159* 174* 128*  BUN 25* 26* 28* 30* 38* 38*  CREATININE 1.69* 1.64* 1.92* 2.13* 2.29* 1.93*  CALCIUM 8.2* 8.0* 7.8* 8.3* 8.1* 7.5*  MG 3.2* 2.9* 2.7*  --   --  2.4   GFR: Estimated Creatinine Clearance: 63.9 mL/min (A) (by C-G formula based on SCr of 1.93 mg/dL (H)). Recent Labs  Lab 05/23/21 1709 05/24/21 0454 05/25/21 0845 05/26/21 0451  WBC 14.5* 11.6* 11.2* 8.0    Liver Function Tests: Recent Labs  Lab 05/20/21 1000  AST 11*  ALT 11  ALKPHOS 53  BILITOT 0.7  PROT 6.3*  ALBUMIN 2.6*   No results for input(s): LIPASE, AMYLASE in the last 168 hours. No results for input(s): AMMONIA in the last 168 hours.  ABG    Component Value Date/Time   PHART 7.348 (L) 05/22/2021 1833   PCO2ART 43.4 05/22/2021 1833   PO2ART 147 (H) 05/22/2021 1833   HCO3 23.7 05/22/2021 1833   TCO2 25 05/22/2021 1833   ACIDBASEDEF 2.0 05/22/2021 1833   O2SAT 76.5 05/26/2021 0451      Coagulation Profile: Recent Labs  Lab 05/20/21 1000 05/22/21 1300  INR 1.1 1.4*     CCT: n/a   05/24/21 MSN, AGACNP-BC [facility] Amion for pager  05/26/2021, 10:06 AM

## 2021-05-26 NOTE — Progress Notes (Signed)
Patient ID: Adam James, male   DOB: June 05, 1971, 50 y.o.   MRN: [id number] [facility] Evening Rounds:  Hemodynamically stable on milrinone 0.125.   CVP 11  Good urine output today.  -900 cc so far.  Ambulated with PT.

## 2021-05-26 NOTE — Progress Notes (Addendum)
[facility] DAILY ICU PROGRESS NOTE                   [address]            [place] [address]          [phone]   4 Days Post-Op Procedure(s) (LRB): CORONARY ARTERY BYPASS GRAFTING (CABG) X 3 USING LEFT INTERNAL MAMMARY ARTERY AND RIGHT ENDOSCOPIC GREATER SAPHENOUS VEIN CONDUITS (N/A) TRANSESOPHAGEAL ECHOCARDIOGRAM (TEE) (N/A) APPLICATION OF CELL SAVER ENDOVEIN HARVEST OF GREATER SAPHENOUS VEIN (Right)  Total Length of Stay:  LOS: 4 days   Subjective: Patient has not had a bowel movement but does not want laxative.  Objective: Vital signs in last 24 hours: Temp:  [97.6 F (36.4 C)-98.1 F (36.7 C)] 98.1 F (36.7 C) (10/31 0400) Pulse Rate:  [90-123] 104 (10/31 0700) Cardiac Rhythm: Normal sinus rhythm (10/31 0400) Resp:  [11-26] 11 (10/31 0500) BP: (92-128)/(57-83) 110/67 (10/31 0700) SpO2:  [87 %-98 %] 98 % (10/31 0700) Weight:  [114.4 kg] 114.4 kg (10/31 0646)  Filed Weights   05/24/21 0600 05/25/21 0500 05/26/21 0646  Weight: 110.9 kg 111.4 kg 114.4 kg    Weight change: 3 kg   Hemodynamic parameters for last 24 hours: CVP:  [8 mmHg-21 mmHg] 8 mmHg  Intake/Output from previous day: 10/30 0701 - 10/31 0700 In: 611.5 [P.O.:240; I.V.:189.5; Blood:182] Out: 1525 [Urine:1525]  Intake/Output this shift: No intake/output data recorded.  Current Meds: Scheduled Meds:  acetaminophen  1,000 mg Oral Q6H   Or   acetaminophen (TYLENOL) oral liquid 160 mg/5 mL  1,000 mg Per Tube Q6H   amiodarone  400 mg Oral BID   aspirin EC  325 mg Oral Daily   Or   aspirin  324 mg Per Tube Daily   atorvastatin  80 mg Oral Daily   bisacodyl  10 mg Oral Daily   Or   bisacodyl  10 mg Rectal Daily   Chlorhexidine Gluconate Cloth  6 each Topical Daily   Chlorhexidine Gluconate Cloth  6 each Topical Q0600   docusate sodium  200 mg Oral Daily   ferrous sulfate  325 mg Oral Q supper   furosemide  40 mg Intravenous BID   insulin aspart  0-24 Units Subcutaneous Q4H    insulin aspart  2 Units Subcutaneous TID WC   insulin detemir  8 Units Subcutaneous BID   pantoprazole  40 mg Oral Daily   polyethylene glycol  17 g Oral Daily   sodium chloride flush  10-40 mL Intracatheter Q12H   sodium chloride flush  3 mL Intravenous Q12H   Continuous Infusions:  sodium chloride Stopped (05/23/21 0936)   sodium chloride     sodium chloride 20 mL/hr at 05/22/21 1250   albumin human     And   sodium chloride     lactated ringers     lactated ringers     lactated ringers Stopped (05/24/21 0003)   milrinone 0.25 mcg/kg/min (05/26/21 0649)   PRN Meds:.sodium chloride, albumin human **AND** sodium chloride, dextrose, fentaNYL (SUBLIMAZE) injection, lactated ringers, metoprolol tartrate, ondansetron (ZOFRAN) IV, sodium chloride flush, sodium chloride flush, traMADol  General appearance: alert, cooperative, and no distress Heart: RRR Lungs: Diminished bibasilar breath sounds Abdomen: Soft, non tender, sporadic bowel sounds present Extremities: Mild LE edema Wound: Sternal wound is clean and dry.  Lab Results: CBC: Recent Labs    05/25/21 0845 05/26/21 0451  WBC 11.2* 8.0  HGB 7.5* 7.0*  HCT 23.0*  21.4*  PLT 227 207    BMET:  Recent Labs    05/25/21 0845 05/26/21 0451  NA 127* 128*  K 4.6 4.1  CL 96* 99  CO2 23 22  GLUCOSE 174* 128*  BUN 38* 38*  CREATININE 2.29* 1.93*  CALCIUM 8.1* 7.5*     CMET: Lab Results  Component Value Date   WBC 8.0 05/26/2021   HGB 7.0 (L) 05/26/2021   HCT 21.4 (L) 05/26/2021   PLT 207 05/26/2021   GLUCOSE 128 (H) 05/26/2021   CHOL 167 10/07/2020   TRIG 125 10/07/2020   HDL 36 (L) 10/07/2020   LDLCALC 108 (H) 10/07/2020   ALT 11 05/20/2021   AST 11 (L) 05/20/2021   NA 128 (L) 05/26/2021   K 4.1 05/26/2021   CL 99 05/26/2021   CREATININE 1.93 (H) 05/26/2021   BUN 38 (H) 05/26/2021   CO2 22 05/26/2021   INR 1.4 (H) 05/22/2021   HGBA1C 6.5 (H) 05/20/2021      PT/INR:  No results for input(s): LABPROT,  INR in the last 72 hours.  Radiology: No results found.   Assessment/Plan: S/P Procedure(s) (LRB): CORONARY ARTERY BYPASS GRAFTING (CABG) X 3 USING LEFT INTERNAL MAMMARY ARTERY AND RIGHT ENDOSCOPIC GREATER SAPHENOUS VEIN CONDUITS (N/A) TRANSESOPHAGEAL ECHOCARDIOGRAM (TEE) (N/A) APPLICATION OF CELL SAVER ENDOVEIN HARVEST OF GREATER SAPHENOUS VEIN (Right) CV-Had VT previously. Slightly tachycardic this am.  On Milrinone drip and Amiodarone 400 mg bid. Co ox this am up to 76.5. Hope to start weaning Milrinone soon. Pulmonary-on room air.  Check CXR in am. Encourage incentive spirometer. Volume overload-on Lasix 40 mg IV bid Expected post op blood loss anemia-H and H this am slightly decreased to 7 and 21.4. Continue oral Ferrous 5. DM-CBGs 113/159/120. Pre op HGA1C 6.5. On Insulin. Will likely restart Glyburide and Metformin closer/at discharge. 6. CKD-creatinine this am slightly decreased to 1.93. Chronic Kidney Disease   Stage I     GFR >90  Stage II    GFR 60-89  Stage IIIA GFR 45-59  Stage IIIB GFR 30-44  Stage IV   GFR 15-29  Stage V    GFR  <15  Lab Results  Component Value Date   CREATININE 1.93 (H) 05/26/2021   Estimated Creatinine Clearance: 63.9 mL/min (A) (by C-G formula based on SCr of 1.93 mg/dL (H)).  7. Deconditioned-continue with PT 8. S/p fifth ray amputation (osteomyelitis). He finished antibiotic earlier this month   Agree with above. Creatinine trending down. Will wean milrinone. Transferred to [place].  Adam James

## 2021-05-26 NOTE — Evaluation (Signed)
Physical Therapy Evaluation Patient Details Name: Adam James MRN: [id number] DOB: 1970/10/28 Today's Date: 05/26/2021  History of Present Illness  The pt is a 50 yo male presenting 10/27 for CABG x3 due to CAD. Of note, pt with recent admission 10/3-10/12 for CHF exacerbation with LLE osteomyelitis and 5th ray amputation. PMH includes: CHF with EF of 25%, DM II.   Clinical Impression  Pt in bed upon arrival of PT, agreeable to evaluation at this time. Prior to admission the pt was mobilizing independently without need for AD or assist at home, but suspect he was not following weight bearing precaution of TDWB LLE from last admission. The pt does live in a home with a daughter who can assist intermittently as needed. The pt now presents with limitations in functional mobility, power, dynamic stability, and endurance due to above dx, and will continue to benefit from skilled PT to address these deficits. The pt was agreeable to brief session of ambulation with use of eva walker, but continued to wt bear on LLE with all ambulation. The pt prefers to mobilize without cues or instructions from therapist and was not receptive to corrections at this time. The pt did complete 125 ft hallway ambulation, but had x3 stumbles with LLE requiring minA to recover. Will continue to benefit from skilled PT to progress stability and endurance to allow for return home.         Recommendations for follow up therapy are one component of a multi-disciplinary discharge planning process, led by the attending physician.  Recommendations may be updated based on patient status, additional functional criteria and insurance authorization.  Follow Up Recommendations No PT follow up    Assistance Recommended at Discharge Set up Supervision/Assistance  Functional Status Assessment Patient has had a recent decline in their functional status and demonstrates the ability to make significant improvements in function in a  reasonable and predictable amount of time.  Equipment Recommendations   (shower chair)    Recommendations for Other Services       Precautions / Restrictions Precautions Precautions: Sternal Precaution Booklet Issued: No Required Braces or Orthoses: Other Brace Other Brace: LLE CAM boot from last admission Restrictions Weight Bearing Restrictions: Yes LLE Weight Bearing: Touchdown weight bearing Other Position/Activity Restrictions: from last admission (10/3) but pt not maintaining      Mobility  Bed Mobility Overal bed mobility: Modified Independent             General bed mobility comments: pt able to complete without assist, prefers to make all movements and adjustments without assist. HOB elevated    Transfers Overall transfer level: Needs assistance Equipment used: None Transfers: Sit to/from Stand Sit to Stand: Min guard;+2 safety/equipment           General transfer comment: pt asking for assist of 2, able to complete without UE support  and no physical assist, but did wt bear on LLE    Ambulation/Gait Ambulation/Gait assistance: Min guard;Min assist;+2 safety/equipment Gait Distance (Feet): 125 Feet Assistive device: Fara Boros Gait Pattern/deviations: Step-through pattern;Decreased stride length;Trunk flexed   Gait velocity interpretation: <1.31 ft/sec, indicative of household ambulator General Gait Details: x3 stumbles with LLE, pt wt bearing on LLE but prefers to walk without cues or instructions from therapist      Balance Overall balance assessment: Mild deficits observed, not formally tested  Pertinent Vitals/Pain Pain Assessment: Faces Faces Pain Scale: Hurts a little bit Pain Location: chest incision Pain Descriptors / Indicators: Discomfort Pain Intervention(s): Limited activity within patient's tolerance;Monitored during session;Repositioned    Home Living Family/patient  expects to be discharged to:: Private residence Living Arrangements: Children Available Help at Discharge: Family;Available PRN/intermittently Type of Home: Mobile home Home Access: Stairs to enter Entrance Stairs-Rails: Right;Left Entrance Stairs-Number of Steps: 5-6   Home Layout: One level Home Equipment: Agricultural consultant (2 wheels);Wheelchair - manual Additional Comments: Lives with 54 y.o. daughter who works night shift    Prior Function Prior Level of Function : Independent/Modified Independent             Mobility Comments: pt reports he had returned to full independence after last hospitalization.       Hand Dominance   Dominant Hand: Right    Extremity/Trunk Assessment   Upper Extremity Assessment Upper Extremity Assessment: Overall WFL for tasks assessed    Lower Extremity Assessment Lower Extremity Assessment: LLE deficits/detail LLE Deficits / Details: pt in CAM boot from last admission (5th ray amputaion). toes slightly discolored, pt states he manages the CAM boot without assist or issue at home. from last admission he refused to follow wt bearing instructions so presume he has been fully wt bearing on his LLE. LLE:  (unable to fully assess as pt declined) LLE Coordination: decreased gross motor    Cervical / Trunk Assessment Cervical / Trunk Assessment: Other exceptions Cervical / Trunk Exceptions: thoracic surgery  Communication   Communication: No difficulties  Cognition Arousal/Alertness: Awake/alert Behavior During Therapy: Flat affect;Agitated Overall Cognitive Status: Within Functional Limits for tasks assessed                                 General Comments: pt able to follow commands but prefers to be in charge of session. does not respond well to encouragement or instructions regarding mobility or wt bearing precautions        General Comments General comments (skin integrity, edema, etc.): VSS on 2L, maintained 94% on 2L  with gait        Assessment/Plan    PT Assessment Patient needs continued PT services  PT Problem List Decreased strength;Decreased range of motion;Decreased activity tolerance;Decreased balance;Decreased mobility;Decreased coordination;Decreased safety awareness       PT Treatment Interventions DME instruction;Gait training;Stair training;Functional mobility training;Therapeutic activities;Therapeutic exercise;Patient/family education    PT Goals (Current goals can be found in the Care Plan section)  Acute Rehab PT Goals Patient Stated Goal: return home PT Goal Formulation: With patient Time For Goal Achievement: 06/09/21 Potential to Achieve Goals: Fair    Frequency Min 3X/week   Barriers to discharge Inaccessible home environment pt with steps to enter his home       AM-PAC PT "6 Clicks" Mobility  Outcome Measure Help needed turning from your back to your side while in a flat bed without using bedrails?: A Little Help needed moving from lying on your back to sitting on the side of a flat bed without using bedrails?: A Little Help needed moving to and from a bed to a chair (including a wheelchair)?: A Little Help needed standing up from a chair using your arms (e.g., wheelchair or bedside chair)?: A Little Help needed to walk in hospital room?: A Little Help needed climbing 3-5 steps with a railing? : A Little 6 Click Score: 18    End of  Session Equipment Utilized During Treatment: Oxygen Activity Tolerance: Patient tolerated treatment well Patient left: in bed;with call bell/phone within reach;with nursing/sitter in room Nurse Communication: Mobility status PT Visit Diagnosis: Other abnormalities of gait and mobility (R26.89);Pain Pain - part of body:  (chest)    Time: 0272-5366 PT Time Calculation (min) (ACUTE ONLY): 17 min   Charges:   PT Evaluation $PT Eval Low Complexity: 1 Low          Vickki Muff, PT, DPT   Acute Rehabilitation Department Pager #:  [phone]  Ronnie Derby 05/26/2021, 5:17 PM

## 2021-05-27 ENCOUNTER — Inpatient Hospital Stay (HOSPITAL_COMMUNITY): Payer: Self-pay

## 2021-05-27 LAB — BPAM RBC
Blood Product Expiration Date: 202211052359
Blood Product Expiration Date: 202211072359
ISSUE DATE / TIME: 202210301219
ISSUE DATE / TIME: 202210310917
Unit Type and Rh: 600
Unit Type and Rh: 600

## 2021-05-27 LAB — TYPE AND SCREEN
ABO/RH(D): A NEG
Antibody Screen: NEGATIVE
Unit division: 0
Unit division: 0

## 2021-05-27 LAB — BASIC METABOLIC PANEL
Anion gap: 5 (ref 5–15)
BUN: 36 mg/dL — ABNORMAL HIGH (ref 6–20)
CO2: 25 mmol/L (ref 22–32)
Calcium: 8.2 mg/dL — ABNORMAL LOW (ref 8.9–10.3)
Chloride: 102 mmol/L (ref 98–111)
Creatinine, Ser: 1.65 mg/dL — ABNORMAL HIGH (ref 0.61–1.24)
GFR, Estimated: 51 mL/min — ABNORMAL LOW (ref >60)
Glucose, Bld: 126 mg/dL — ABNORMAL HIGH (ref 70–99)
Potassium: 4.5 mmol/L (ref 3.5–5.1)
Sodium: 132 mmol/L — ABNORMAL LOW (ref 135–145)

## 2021-05-27 LAB — CBC
HCT: 26.4 % — ABNORMAL LOW (ref 39.0–52.0)
Hemoglobin: 8.6 g/dL — ABNORMAL LOW (ref 13.0–17.0)
MCH: 28.7 pg (ref 26.0–34.0)
MCHC: 32.6 g/dL (ref 30.0–36.0)
MCV: 88 fL (ref 80.0–100.0)
Platelets: 291 10*3/uL (ref 150–400)
RBC: 3 MIL/uL — ABNORMAL LOW (ref 4.22–5.81)
RDW: 14.7 % (ref 11.5–15.5)
WBC: 8.1 10*3/uL (ref 4.0–10.5)
nRBC: 0 % (ref 0.0–0.2)

## 2021-05-27 LAB — COOXEMETRY PANEL
Carboxyhemoglobin: 1.7 % — ABNORMAL HIGH (ref 0.5–1.5)
Methemoglobin: 0.7 % (ref 0.0–1.5)
O2 Saturation: 59.6 %
Total hemoglobin: 10.1 g/dL — ABNORMAL LOW (ref 12.0–16.0)

## 2021-05-27 LAB — GLUCOSE, CAPILLARY
Glucose-Capillary: 117 mg/dL — ABNORMAL HIGH (ref 70–99)
Glucose-Capillary: 162 mg/dL — ABNORMAL HIGH (ref 70–99)
Glucose-Capillary: 115 mg/dL — ABNORMAL HIGH (ref 70–99)
Glucose-Capillary: 138 mg/dL — ABNORMAL HIGH (ref 70–99)
Glucose-Capillary: 142 mg/dL — ABNORMAL HIGH (ref 70–99)

## 2021-05-27 IMAGING — DX DG CHEST 1V PORT
1 series · 1 of 1 positions shown · non-contrast
Comparison: Comparison made with [DATE].

CLINICAL DATA: Shortness of breath 49-year-old male, history of
pleural effusion.

EXAM:
PORTABLE CHEST 1 VIEW

[chest ap]
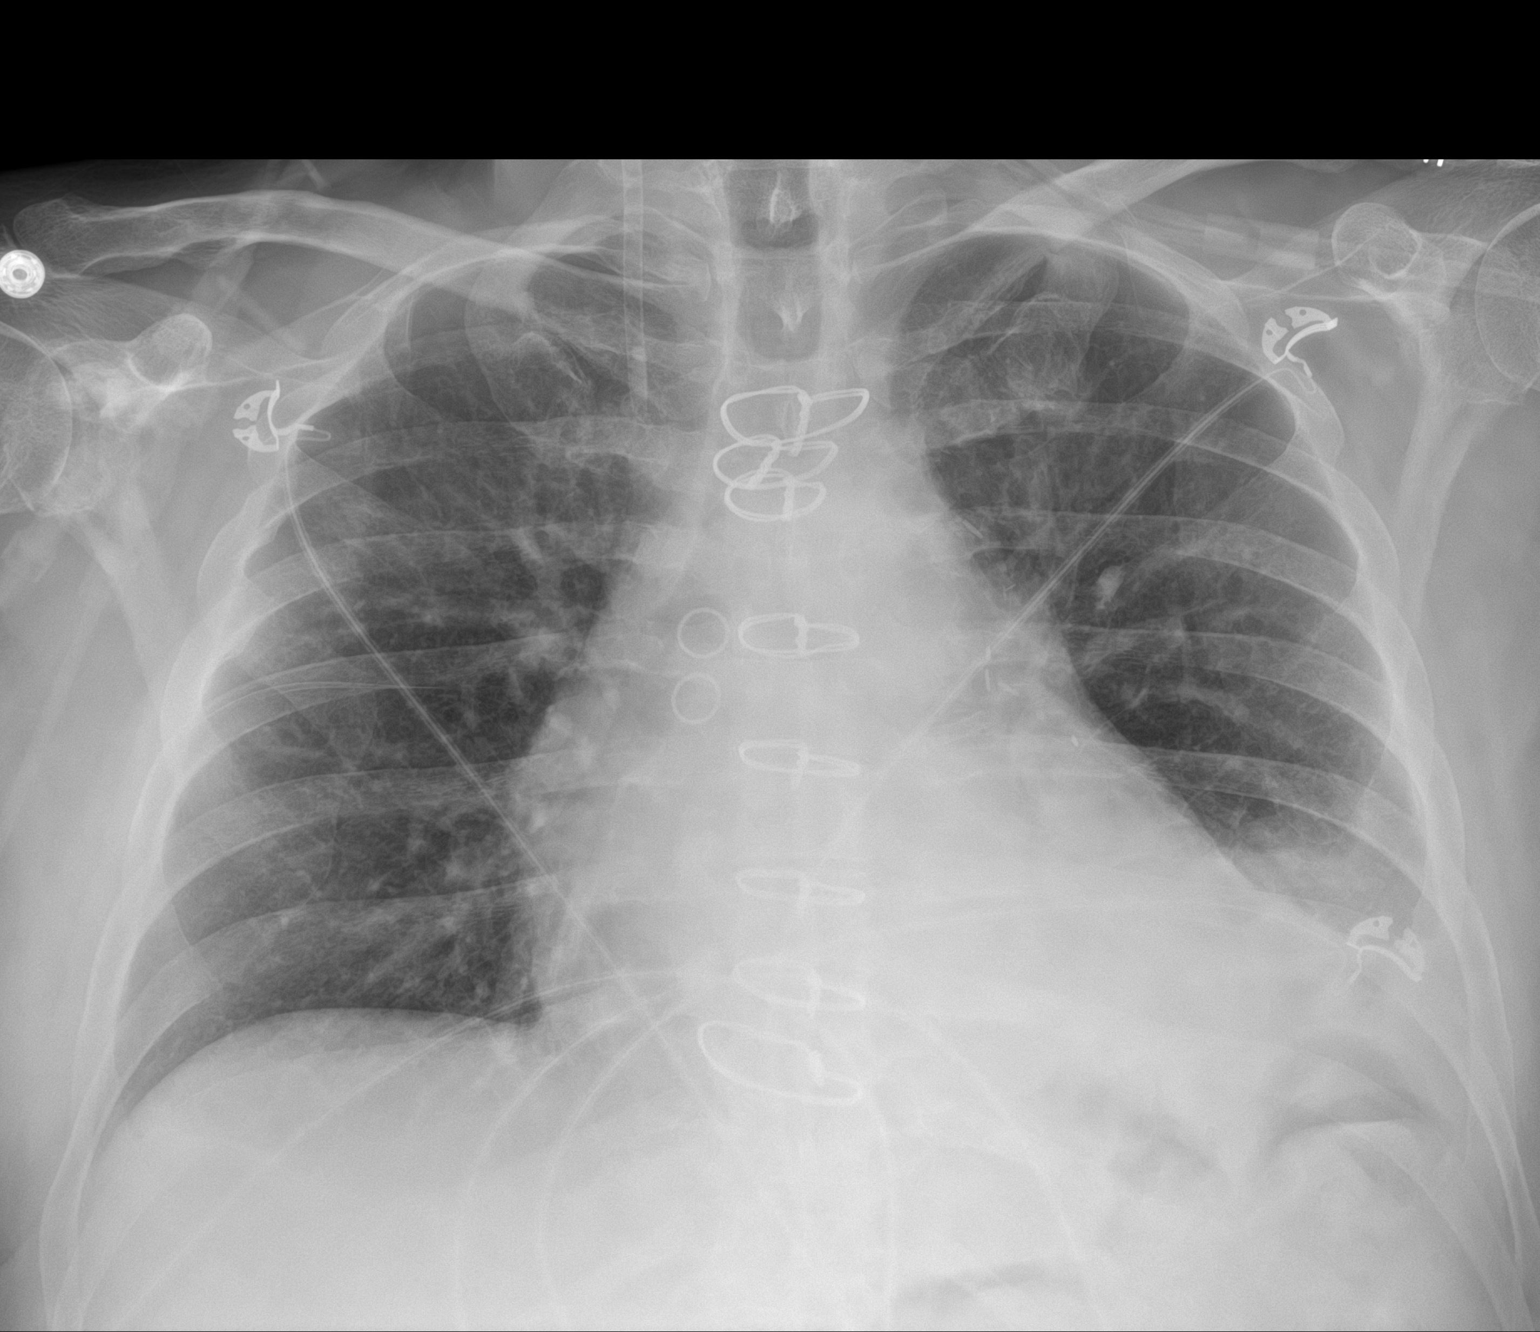

[1 of 1 positions shown; findings below may reference images not displayed]

FINDINGS: RIGHT IJ vascular sheath again terminating in the proximal superior
vena cava. EKG leads projecting over the chest.

Post median sternotomy for CABG.

Cardiomediastinal contours and hilar structures are stable.

Increased LEFT basilar opacity following removal of chest support
tubes present on the previous radiograph. Obscured LEFT
hemidiaphragm. No pneumothorax.

On limited assessment there is no acute skeletal process.
IMPRESSION: Increased LEFT basilar opacity following removal of chest support
tubes. Findings likely reflect effusion and basilar volume loss.
Correlate with any signs of infection.

## 2021-05-27 MED ORDER — SPIRONOLACTONE 25 MG PO TABS
25.0000 mg | ORAL_TABLET | Freq: Every day | ORAL | Status: DC
  Administered 2021-05-27 – 2021-05-29 (×3): 25 mg via ORAL
  Filled 2021-05-27 (×3): qty 1

## 2021-05-27 MED ORDER — AMIODARONE HCL 200 MG PO TABS
200.0000 mg | ORAL_TABLET | Freq: Two times a day (BID) | ORAL | Status: DC
  Administered 2021-05-27 – 2021-05-28 (×4): 200 mg via ORAL
  Filled 2021-05-27 (×4): qty 1

## 2021-05-27 MED ORDER — DIGOXIN 125 MCG PO TABS
0.1250 mg | ORAL_TABLET | Freq: Every day | ORAL | Status: DC
  Administered 2021-05-27 – 2021-05-29 (×3): 0.125 mg via ORAL
  Filled 2021-05-27 (×3): qty 1

## 2021-05-27 NOTE — Progress Notes (Addendum)
[facility] DAILY ICU PROGRESS NOTE                   [address]            [place] [address]          [phone]   5 Days Post-Op Procedure(s) (LRB): CORONARY ARTERY BYPASS GRAFTING (CABG) X 3 USING LEFT INTERNAL MAMMARY ARTERY AND RIGHT ENDOSCOPIC GREATER SAPHENOUS VEIN CONDUITS (N/A) TRANSESOPHAGEAL ECHOCARDIOGRAM (TEE) (N/A) APPLICATION OF CELL SAVER ENDOVEIN HARVEST OF GREATER SAPHENOUS VEIN (Right)  Total Length of Stay:  LOS: 5 days   Subjective: Patient has not had a bowel movement but does not want laxative.  Objective: Vital signs in last 24 hours: Temp:  [97.8 F (36.6 C)-98.4 F (36.9 C)] 97.8 F (36.6 C) (11/01 0400) Pulse Rate:  [88-109] 101 (11/01 0700) Cardiac Rhythm: Normal sinus rhythm (11/01 0400) Resp:  [11-28] 17 (11/01 0700) BP: (96-133)/(54-98) 128/82 (11/01 0700) SpO2:  [82 %-99 %] 96 % (11/01 0700) Weight:  [112.3 kg] 112.3 kg (11/01 0500)  Filed Weights   05/25/21 0500 05/26/21 0646 05/27/21 0500  Weight: 111.4 kg 114.4 kg 112.3 kg    Weight change: -2.1 kg   Hemodynamic parameters for last 24 hours: CVP:  [3 mmHg-50 mmHg] 19 mmHg  Intake/Output from previous day: 10/31 0701 - 11/01 0700 In: 379.1 [I.V.:110.1; Blood:269] Out: 3725 [Urine:3725]  Intake/Output this shift: No intake/output data recorded.  Current Meds: Scheduled Meds:  acetaminophen  1,000 mg Oral Q6H   Or   acetaminophen (TYLENOL) oral liquid 160 mg/5 mL  1,000 mg Per Tube Q6H   amiodarone  200 mg Oral BID   aspirin EC  325 mg Oral Daily   Or   aspirin  324 mg Per Tube Daily   atorvastatin  80 mg Oral Daily   bisacodyl  10 mg Oral Daily   Or   bisacodyl  10 mg Rectal Daily   Chlorhexidine Gluconate Cloth  6 each Topical Daily   Chlorhexidine Gluconate Cloth  6 each Topical Q0600   [facility], Patient & Family Education   Does not apply Once   digoxin  0.125 mg Oral Daily   docusate sodium  200 mg Oral Daily   ferrous sulfate   325 mg Oral Q supper   furosemide  60 mg Intravenous BID   insulin aspart  0-24 Units Subcutaneous Q4H   insulin aspart  2 Units Subcutaneous TID WC   insulin detemir  8 Units Subcutaneous BID   pantoprazole  40 mg Oral Daily   polyethylene glycol  17 g Oral Daily   sodium chloride flush  10-40 mL Intracatheter Q12H   sodium chloride flush  3 mL Intravenous Q12H   sodium chloride flush  3 mL Intravenous Q12H   spironolactone  25 mg Oral Daily   Continuous Infusions:  sodium chloride Stopped (05/23/21 0936)   sodium chloride     sodium chloride 20 mL/hr at 05/22/21 1250   sodium chloride     albumin human     And   sodium chloride     lactated ringers     lactated ringers     lactated ringers Stopped (05/24/21 0003)   PRN Meds:.sodium chloride, sodium chloride, albumin human **AND** sodium chloride, dextrose, fentaNYL (SUBLIMAZE) injection, lactated ringers, metoprolol tartrate, ondansetron (ZOFRAN) IV, sodium chloride flush, sodium chloride flush, sodium chloride flush, traMADol  General appearance: alert, cooperative, and no distress Heart: RRR Lungs: Diminished bibasilar  breath sounds Abdomen: Soft, non tender, sporadic bowel sounds present Extremities: Mild LE edema. LLE wrapped (precious ray amputation) Wound: Sternal and RLE wounds are clean and dry.  Lab Results: CBC: Recent Labs    05/26/21 0451 05/27/21 0415  WBC 8.0 8.1  HGB 7.0* 8.6*  HCT 21.4* 26.4*  PLT 207 291    BMET:  Recent Labs    05/26/21 0451 05/27/21 0415  NA 128* 132*  K 4.1 4.5  CL 99 102  CO2 22 25  GLUCOSE 128* 126*  BUN 38* 36*  CREATININE 1.93* 1.65*  CALCIUM 7.5* 8.2*     CMET: Lab Results  Component Value Date   WBC 8.1 05/27/2021   HGB 8.6 (L) 05/27/2021   HCT 26.4 (L) 05/27/2021   PLT 291 05/27/2021   GLUCOSE 126 (H) 05/27/2021   CHOL 167 10/07/2020   TRIG 125 10/07/2020   HDL 36 (L) 10/07/2020   LDLCALC 108 (H) 10/07/2020   ALT 11 05/20/2021   AST 11 (L)  05/20/2021   NA 132 (L) 05/27/2021   K 4.5 05/27/2021   CL 102 05/27/2021   CREATININE 1.65 (H) 05/27/2021   BUN 36 (H) 05/27/2021   CO2 25 05/27/2021   INR 1.4 (H) 05/22/2021   HGBA1C 6.5 (H) 05/20/2021      PT/INR:  No results for input(s): LABPROT, INR in the last 72 hours.  Radiology: No results found.   Assessment/Plan: S/P Procedure(s) (LRB): CORONARY ARTERY BYPASS GRAFTING (CABG) X 3 USING LEFT INTERNAL MAMMARY ARTERY AND RIGHT ENDOSCOPIC GREATER SAPHENOUS VEIN CONDUITS (N/A) TRANSESOPHAGEAL ECHOCARDIOGRAM (TEE) (N/A) APPLICATION OF CELL SAVER ENDOVEIN HARVEST OF GREATER SAPHENOUS VEIN (Right) CV-Had VT previously. Slightly tachycardic this am.  On Milrinone drip, Digoxin 0.125 mg daily, Amiodarone 400 mg bid, and Spironolactone 25 mg diaiy Co ox this am up to 59.6 Per heart failure, stop Milrinone drip. Pulmonary-on room air.  CXR appears stable. Encourage incentive spirometer. Volume overload-on Lasix 60 mg IV bid Expected post op blood loss anemia-H and H this am slightly increased to 8.6 and 26.4. Continue oral Ferrous 5. DM-CBGs 159/172/117. Pre op HGA1C 6.5. On Insulin. Will likely restart Glyburide and Metformin closer/at discharge. 6. CKD-creatinine this am slightly decreased from 1.93 to 1.63. Chronic Kidney Disease   Stage I     GFR >90  Stage II    GFR 60-89  Stage IIIA GFR 45-59  Stage IIIB GFR 30-44  Stage IV   GFR 15-29  Stage V    GFR  <15  Lab Results  Component Value Date   CREATININE 1.65 (H) 05/27/2021   Estimated Creatinine Clearance: 74 mL/min (A) (by C-G formula based on SCr of 1.65 mg/dL (H)).  7. Deconditioned-continue with PT 8. S/p fifth ray amputation (osteomyelitis). He finished antibiotic earlier this month   Agree with above Creat stable Weaning milr Awaiting floor bed  Adam James Adam James

## 2021-05-27 NOTE — Progress Notes (Signed)
Patient ID: Adam James, male   DOB: 1970/10/03, 50 y.o.   MRN: [id number]     Advanced Heart Failure Rounding Note  PCP-Cardiologist: Norman Herrlich, MD   Subjective:    10/27: CABG X 3. VT coming off pump s/p defibrillation  No further VT, now on po amiodarone.   MAP stable, off pressors.  Co-ox 60% with CVP 10 on milrinone 0.125.  Good diuresis yesterday, weight down 5 lbs.   No complaints this morning, feeling better.  He has walked in the hall.   Creatinine 1.6 => 1.9 => 2.1 => 1.93 => 1.65   Hgb 7 => 8.6 today after 1 unit PRBCs   Objective:   Weight Range: 112.3 kg Body mass index is 30.94 kg/m.   Vital Signs:   Temp:  [97.8 F (36.6 C)-98.4 F (36.9 C)] 97.8 F (36.6 C) (11/01 0400) Pulse Rate:  [88-109] 101 (11/01 0700) Resp:  [11-28] 17 (11/01 0700) BP: (96-133)/(54-98) 128/82 (11/01 0700) SpO2:  [82 %-99 %] 96 % (11/01 0700) Weight:  [112.3 kg] 112.3 kg (11/01 0500) Last BM Date: 05/21/21  Weight change: Filed Weights   05/25/21 0500 05/26/21 0646 05/27/21 0500  Weight: 111.4 kg 114.4 kg 112.3 kg    Intake/Output:   Intake/Output Summary (Last 24 hours) at 05/27/2021 0751 Last data filed at 05/27/2021 0700 Gross per 24 hour  Intake 379.08 ml  Output 3725 ml  Net -3345.92 ml      Physical Exam   General: NAD Neck: JVP 10 cm, no thyromegaly or thyroid nodule.  Lungs: Clear to auscultation bilaterally with normal respiratory effort. CV: Nondisplaced PMI.  Heart regular S1/S2, no S3/S4, no murmur.  1+ edema to knee on right.  Abdomen: Soft, nontender, no hepatosplenomegaly, no distention.  Skin: Intact without lesions or rashes.  Neurologic: Alert and oriented x 3.  Psych: Normal affect. Extremities: No clubbing or cyanosis. Left leg in boot.  HEENT: Normal.    Telemetry   NSR around 100, no recurrent VT (personally reviewed)  Labs    CBC Recent Labs    05/26/21 0451 05/27/21 0415  WBC 8.0 8.1  HGB 7.0* 8.6*  HCT 21.4* 26.4*   MCV 88.4 88.0  PLT 207 291   Basic Metabolic Panel Recent Labs    32/67/12 0451 05/27/21 0415  NA 128* 132*  K 4.1 4.5  CL 99 102  CO2 22 25  GLUCOSE 128* 126*  BUN 38* 36*  CREATININE 1.93* 1.65*  CALCIUM 7.5* 8.2*  MG 2.4  --    Liver Function Tests No results for input(s): AST, ALT, ALKPHOS, BILITOT, PROT, ALBUMIN in the last 72 hours.  No results for input(s): LIPASE, AMYLASE in the last 72 hours. Cardiac Enzymes No results for input(s): CKTOTAL, CKMB, CKMBINDEX, TROPONINI in the last 72 hours.  BNP: BNP (last 3 results) Recent Labs    03/23/21 0045 04/25/21 1505 04/28/21 1047  BNP 1,129.9* 1,691.7* 1,072.2*    ProBNP (last 3 results) Recent Labs    10/07/20 1004  PROBNP 468*     D-Dimer No results for input(s): DDIMER in the last 72 hours. Hemoglobin A1C No results for input(s): HGBA1C in the last 72 hours.  Fasting Lipid Panel No results for input(s): CHOL, HDL, LDLCALC, TRIG, CHOLHDL, LDLDIRECT in the last 72 hours. Thyroid Function Tests No results for input(s): TSH, T4TOTAL, T3FREE, THYROIDAB in the last 72 hours.  Invalid input(s): FREET3  Other results:   Imaging    No results found.  Medications:     Scheduled Medications:  acetaminophen  1,000 mg Oral Q6H   Or   acetaminophen (TYLENOL) oral liquid 160 mg/5 mL  1,000 mg Per Tube Q6H   amiodarone  400 mg Oral BID   aspirin EC  325 mg Oral Daily   Or   aspirin  324 mg Per Tube Daily   atorvastatin  80 mg Oral Daily   bisacodyl  10 mg Oral Daily   Or   bisacodyl  10 mg Rectal Daily   Chlorhexidine Gluconate Cloth  6 each Topical Daily   Chlorhexidine Gluconate Cloth  6 each Topical Q0600   [facility], Patient & Family Education   Does not apply Once   docusate sodium  200 mg Oral Daily   ferrous sulfate  325 mg Oral Q supper   furosemide  60 mg Intravenous BID   insulin aspart  0-24 Units Subcutaneous Q4H   insulin aspart  2 Units Subcutaneous TID WC    insulin detemir  8 Units Subcutaneous BID   pantoprazole  40 mg Oral Daily   polyethylene glycol  17 g Oral Daily   sodium chloride flush  10-40 mL Intracatheter Q12H   sodium chloride flush  3 mL Intravenous Q12H   sodium chloride flush  3 mL Intravenous Q12H   spironolactone  12.5 mg Oral Daily    Infusions:  sodium chloride Stopped (05/23/21 0936)   sodium chloride     sodium chloride 20 mL/hr at 05/22/21 1250   sodium chloride     albumin human     And   sodium chloride     lactated ringers     lactated ringers     lactated ringers Stopped (05/24/21 0003)   milrinone 0.125 mcg/kg/min (05/27/21 0700)    PRN Medications: sodium chloride, sodium chloride, albumin human **AND** sodium chloride, dextrose, fentaNYL (SUBLIMAZE) injection, lactated ringers, metoprolol tartrate, ondansetron (ZOFRAN) IV, sodium chloride flush, sodium chloride flush, sodium chloride flush, traMADol    Patient Profile   Adam James is a 50 year old with history of CAD s/p PCI/stent 2007 in [place], chronic HFrEF/ICM, CKD Stage IIIa, HTN, hyperlipidemia, DMII, 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    Admitted for scheduled CABG    Assessment/Plan   1. Acute on chronic systolic CHF:  With prior concern for low output HF.  Ischemic cardiomyopathy.  ? If frequent PVCs also contributing. Initial echo in 2/22 EF 40-45%.  Echo in 7/22 EF 30-35%.  Echo in 8/22 EF down to 25% with mild RV dysfunction.  Now s/p CABG, off NE but remains on milrinone 0.125.  Co-ox 60% today, CVP 10.  Creatinine down to 1.65.  - Lasix 60 mg IV bid today and increase spironolactone to 25 daily.  - Stop milrinone today and start back on digoxin 0.125.    2. CAD: LHC in 2/22 with 50% dLM, 70% ramus, 70% pLCx, totally occluded proximal RCA, 90% stenosis ostial to mid LAD, 90% mid LAD, totally occluded distal LAD.  Cardiac MRI in 4/22 showed EF 40% with substantial viable myocardium, only  delayed enhancement was subendocardial in the mid to apical inferior wall.  CABG had been planned in 8/22, but patient was admitted left foot diabetic foot infection that progressed to necrotizing fasciitis s/p debridement and IV abx.  Repeat coronary angiography (9/22) showed minimal change from prior study in 2/22. Now s/p CABG x 3 with LIMA-LAD, SVG-OM1, SVG-OM3.  PDA too small  to graft.  - Continue ASA.  - Atorvastatin 80 mg daily.  3. Frequent PVCs/VT perioperatively: Resolved.  Continue po amiodarone.  4. AKI on CKD Stage 3: Creatinine up to 2.1 post-op.  Likely a degree of ATN from hypotension peri-op.  Creatinine down to 1.65 today.  5. H/o GI bleeding: History of erosive esophagitis in 8/22.   - Continue Protonix.  6. Left leg diabetic foot infection => necrotizing fasciitis in August 2022.  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 Completed abx. 7. DM2 8. Anemia: post-op, had appropriate response to 1 unit PRBCs on 10/31.   OK for step down.   Loralie Champagne 05/27/2021 7:51 AM

## 2021-05-28 LAB — GLUCOSE, CAPILLARY
Glucose-Capillary: 111 mg/dL — ABNORMAL HIGH (ref 70–99)
Glucose-Capillary: 132 mg/dL — ABNORMAL HIGH (ref 70–99)
Glucose-Capillary: 157 mg/dL — ABNORMAL HIGH (ref 70–99)
Glucose-Capillary: 161 mg/dL — ABNORMAL HIGH (ref 70–99)
Glucose-Capillary: 167 mg/dL — ABNORMAL HIGH (ref 70–99)

## 2021-05-28 LAB — CBC
HCT: 27.6 % — ABNORMAL LOW (ref 39.0–52.0)
Hemoglobin: 9 g/dL — ABNORMAL LOW (ref 13.0–17.0)
MCH: 28.8 pg (ref 26.0–34.0)
MCHC: 32.6 g/dL (ref 30.0–36.0)
MCV: 88.5 fL (ref 80.0–100.0)
Platelets: 330 10*3/uL (ref 150–400)
RBC: 3.12 MIL/uL — ABNORMAL LOW (ref 4.22–5.81)
RDW: 14.6 % (ref 11.5–15.5)
WBC: 7.8 10*3/uL (ref 4.0–10.5)
nRBC: 0 % (ref 0.0–0.2)

## 2021-05-28 LAB — BASIC METABOLIC PANEL
Anion gap: 5 (ref 5–15)
BUN: 28 mg/dL — ABNORMAL HIGH (ref 6–20)
CO2: 27 mmol/L (ref 22–32)
Calcium: 8.3 mg/dL — ABNORMAL LOW (ref 8.9–10.3)
Chloride: 102 mmol/L (ref 98–111)
Creatinine, Ser: 1.42 mg/dL — ABNORMAL HIGH (ref 0.61–1.24)
GFR, Estimated: >60 mL/min (ref >60)
Glucose, Bld: 121 mg/dL — ABNORMAL HIGH (ref 70–99)
Potassium: 4.4 mmol/L (ref 3.5–5.1)
Sodium: 134 mmol/L — ABNORMAL LOW (ref 135–145)

## 2021-05-28 LAB — COOXEMETRY PANEL
Carboxyhemoglobin: 1.7 % — ABNORMAL HIGH (ref 0.5–1.5)
Methemoglobin: 0.8 % (ref 0.0–1.5)
O2 Saturation: 60.6 %
Total hemoglobin: 9.2 g/dL — ABNORMAL LOW (ref 12.0–16.0)

## 2021-05-28 MED ORDER — TORSEMIDE 20 MG PO TABS: 40.0000 mg | ORAL_TABLET | Freq: Every day | ORAL | Status: DC

## 2021-05-28 MED ORDER — INSULIN ASPART 100 UNIT/ML IJ SOLN
0.0000 [IU] | Freq: Three times a day (TID) | INTRAMUSCULAR | Status: DC
  Administered 2021-05-28 (×2): 4 [IU] via SUBCUTANEOUS
  Administered 2021-05-29: 2 [IU] via SUBCUTANEOUS

## 2021-05-28 MED ORDER — TORSEMIDE 20 MG PO TABS
20.0000 mg | ORAL_TABLET | Freq: Every day | ORAL | Status: DC
  Administered 2021-05-28 – 2021-05-29 (×2): 20 mg via ORAL
  Filled 2021-05-28 (×2): qty 1

## 2021-05-28 MED ORDER — LOSARTAN POTASSIUM 25 MG PO TABS
12.5000 mg | ORAL_TABLET | Freq: Two times a day (BID) | ORAL | Status: DC
  Administered 2021-05-28 (×2): 12.5 mg via ORAL
  Filled 2021-05-28 (×2): qty 1

## 2021-05-28 NOTE — Progress Notes (Signed)
Patient ID: Adam James, male   DOB: 1971/04/13, 50 y.o.   MRN: [id number]      Advanced Heart Failure Rounding Note  PCP-Cardiologist: Norman Herrlich, MD   Subjective:    10/27: CABG X 3. VT coming off pump s/p defibrillation  No further VT, now on po amiodarone.   He is now off milrinone and pressors, co-ox 61% today. CVP 6. MAP stable.   No complaints this morning, feeling better.  He has walked in the hall.   Creatinine 1.6 => 1.9 => 2.1 => 1.93 => 1.65 => 1.42  Hgb stable 9  Objective:   Weight Range: 107.5 kg Body mass index is 29.62 kg/m.   Vital Signs:   Temp:  [98.3 F (36.8 C)-98.8 F (37.1 C)] 98.3 F (36.8 C) (11/02 0743) Pulse Rate:  [93-104] 98 (11/02 0743) Resp:  [15-21] 17 (11/02 0743) BP: (109-134)/(65-88) 130/79 (11/02 0743) SpO2:  [91 %-95 %] 93 % (11/02 0743) Weight:  [107.5 kg] 107.5 kg (11/02 0300) Last BM Date: 05/27/21  Weight change: Filed Weights   05/26/21 0646 05/27/21 0500 05/28/21 0300  Weight: 114.4 kg 112.3 kg 107.5 kg    Intake/Output:   Intake/Output Summary (Last 24 hours) at 05/28/2021 0814 Last data filed at 05/28/2021 0020 Gross per 24 hour  Intake 637.45 ml  Output 2850 ml  Net -2212.55 ml      Physical Exam   General: NAD Neck: No JVD, no thyromegaly or thyroid nodule.  Lungs: Clear to auscultation bilaterally with normal respiratory effort. CV: Nondisplaced PMI.  Heart regular S1/S2, no S3/S4, no murmur.  No peripheral edema.   Abdomen: Soft, nontender, no hepatosplenomegaly, no distention.  Skin: Intact without lesions or rashes.  Neurologic: Alert and oriented x 3.  Psych: Normal affect. Extremities: left leg in boot.  HEENT: Normal.    Telemetry   NSR around 100, no recurrent VT (personally reviewed)  Labs    CBC Recent Labs    05/27/21 0415 05/28/21 0418  WBC 8.1 7.8  HGB 8.6* 9.0*  HCT 26.4* 27.6*  MCV 88.0 88.5  PLT 291 330   Basic Metabolic Panel Recent Labs    96/75/91 0451  05/27/21 0415 05/28/21 0418  NA 128* 132* 134*  K 4.1 4.5 4.4  CL 99 102 102  CO2 22 25 27   GLUCOSE 128* 126* 121*  BUN 38* 36* 28*  CREATININE 1.93* 1.65* 1.42*  CALCIUM 7.5* 8.2* 8.3*  MG 2.4  --   --    Liver Function Tests No results for input(s): AST, ALT, ALKPHOS, BILITOT, PROT, ALBUMIN in the last 72 hours.  No results for input(s): LIPASE, AMYLASE in the last 72 hours. Cardiac Enzymes No results for input(s): CKTOTAL, CKMB, CKMBINDEX, TROPONINI in the last 72 hours.  BNP: BNP (last 3 results) Recent Labs    03/23/21 0045 04/25/21 1505 04/28/21 1047  BNP 1,129.9* 1,691.7* 1,072.2*    ProBNP (last 3 results) Recent Labs    10/07/20 1004  PROBNP 468*     D-Dimer No results for input(s): DDIMER in the last 72 hours. Hemoglobin A1C No results for input(s): HGBA1C in the last 72 hours.  Fasting Lipid Panel No results for input(s): CHOL, HDL, LDLCALC, TRIG, CHOLHDL, LDLDIRECT in the last 72 hours. Thyroid Function Tests No results for input(s): TSH, T4TOTAL, T3FREE, THYROIDAB in the last 72 hours.  Invalid input(s): FREET3  Other results:   Imaging    No results found.   Medications:     Scheduled  Medications:  amiodarone  200 mg Oral BID   aspirin EC  325 mg Oral Daily   Or   aspirin  324 mg Per Tube Daily   atorvastatin  80 mg Oral Daily   bisacodyl  10 mg Oral Daily   Or   bisacodyl  10 mg Rectal Daily   Chlorhexidine Gluconate Cloth  6 each Topical Daily   Chlorhexidine Gluconate Cloth  6 each Topical Q0600   [place] [facility], Patient & Family Education   Does not apply Once   digoxin  0.125 mg Oral Daily   docusate sodium  200 mg Oral Daily   ferrous sulfate  325 mg Oral Q supper   insulin aspart  0-24 Units Subcutaneous Q4H   insulin aspart  2 Units Subcutaneous TID WC   insulin detemir  8 Units Subcutaneous BID   losartan  12.5 mg Oral BID   pantoprazole  40 mg Oral Daily   polyethylene glycol  17 g Oral Daily    sodium chloride flush  10-40 mL Intracatheter Q12H   sodium chloride flush  3 mL Intravenous Q12H   sodium chloride flush  3 mL Intravenous Q12H   spironolactone  25 mg Oral Daily   torsemide  20 mg Oral Daily    Infusions:  sodium chloride Stopped (05/23/21 0936)   sodium chloride     sodium chloride 20 mL/hr at 05/22/21 1250   sodium chloride     albumin human     And   sodium chloride     lactated ringers     lactated ringers     lactated ringers Stopped (05/24/21 0003)    PRN Medications: sodium chloride, sodium chloride, albumin human **AND** sodium chloride, dextrose, fentaNYL (SUBLIMAZE) injection, lactated ringers, metoprolol tartrate, ondansetron (ZOFRAN) IV, sodium chloride flush, sodium chloride flush, sodium chloride flush, traMADol    Patient Profile   Mr Treadwell is a 50 year old with history of CAD s/p PCI/stent 2007 in [place], chronic HFrEF/ICM, CKD Stage IIIa, HTN, hyperlipidemia, DMII, 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    Admitted for scheduled CABG    Assessment/Plan   1. Acute on chronic systolic CHF:  With prior concern for low output HF.  Ischemic cardiomyopathy.  ? If frequent PVCs also contributing. Initial echo in 2/22 EF 40-45%.  Echo in 7/22 EF 30-35%.  Echo in 8/22 EF down to 25% with mild RV dysfunction.  Now s/p CABG, off NE and milrinone.  Co-ox 61% today, CVP 6.  Creatinine down to 1.42  - Stop IV Lasix, start torsemide 20 mg daily.  - Continue spironolactone 25 daily.  - Continue digoxin 0.125.    - Add losartan 12.5 mg bid, if MAP and creatinine remain stable, will transition to [place].  2. CAD: LHC in 2/22 with 50% dLM, 70% ramus, 70% pLCx, totally occluded proximal RCA, 90% stenosis ostial to mid LAD, 90% mid LAD, totally occluded distal LAD.  Cardiac MRI in 4/22 showed EF 40% with substantial viable myocardium, only delayed enhancement was subendocardial in the mid to apical inferior wall.   CABG had been planned in 8/22, but patient was admitted left foot diabetic foot infection that progressed to necrotizing fasciitis s/p debridement and IV abx.  Repeat coronary angiography (9/22) showed minimal change from prior study in 2/22. Now s/p CABG x 3 with LIMA-LAD, SVG-OM1, SVG-OM3.  PDA too small to graft.  - Continue ASA.  - Atorvastatin 80 mg daily.  3. Frequent PVCs/VT perioperatively: Resolved.  Continue po amiodarone.  4. AKI on CKD Stage 3: Creatinine up to 2.1 post-op.  Likely a degree of ATN from hypotension peri-op.  Creatinine down to 1.42 today.  5. H/o GI bleeding: History of erosive esophagitis in 8/22.   - Continue Protonix.  6. Left leg diabetic foot infection => necrotizing fasciitis in August 2022.  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 Completed abx. 7. DM2 8. Anemia: post-op, had appropriate response to 1 unit PRBCs on 10/31. Up to 9.1 today.   Mobilize   Loralie Champagne 05/28/2021 8:14 AM

## 2021-05-28 NOTE — Progress Notes (Signed)
Physical Therapy Treatment Patient Details Name: Adam James MRN: [id number] DOB: 12-Sep-1970 Today's Date: 05/28/2021   History of Present Illness The pt is a 50 yo male presenting 10/27 for CABG x3 due to CAD. Of note, pt with recent admission 10/3-10/12 for CHF exacerbation with LLE osteomyelitis and 5th ray amputation. PMH includes: CHF with EF of 25%, DM II.    PT Comments    Pt tolerates treatment well, ambulating for household distances. Pt demonstrates limited recall of sternal precautions during session, requiring frequent cueing from PT to maintain. Pt also reports not abiding by WB precautions to LLE since last admission, ambulating on LLE as his walker would not fit easily in his home. PT provides education on the importance of abiding by both sets of precautions to allow for wound healing, especially as the pt has diabetes. Pt will benefit from continued mobilization and reinforcement of precautions during this admission. Pt may benefit from more narrow RW to return home with at time of discharge, reporting the one he was provided with last admission was very wide. PT recommends discharge home with assistance of daughter when medically ready.    Recommendations for follow up therapy are one component of a multi-disciplinary discharge planning process, led by the attending physician.  Recommendations may be updated based on patient status, additional functional criteria and insurance authorization.  Follow Up Recommendations  No PT follow up     Assistance Recommended at Discharge Set up Supervision/Assistance  Equipment Recommendations  Rolling walker (2 wheels) (pt reports his walker and wheelchair are wider than typical (although his description does not sound like bariatric RW), would benefit from standard size RW upon returning home)    Recommendations for Other Services       Precautions / Restrictions Precautions Precautions: Sternal Precaution Booklet Issued:  No Precaution Comments: PT verbally reviews and demonstrates sternal precautions. PT also reviews TDWB precautions from previous admission Required Braces or Orthoses: Other Brace Other Brace: LLE CAM boot from last admission Restrictions Weight Bearing Restrictions: Yes LLE Weight Bearing: Touchdown weight bearing Other Position/Activity Restrictions: pt reports not following TDWB precautions from previous admission, does not attempt to follow them during this session despite PT encouragement     Mobility  Bed Mobility Overal bed mobility: Needs Assistance Bed Mobility: Supine to Sit     Supine to sit: Supervision;HOB elevated     General bed mobility comments: cues to maintain sternal precautions and avoid excessive shoulder horizontal abduction    Transfers Overall transfer level: Needs assistance Equipment used: Rolling walker (2 wheels) Transfers: Sit to/from Stand Sit to Stand: Min guard           General transfer comment: cues for hand placement on knees or directly adjacent to legs to push up from bed rather than pushing on rail and horizontally abducting shoulders    Ambulation/Gait Ambulation/Gait assistance: Min guard Gait Distance (Feet): 150 Feet Assistive device: Rolling walker (2 wheels) Gait Pattern/deviations: Step-through pattern Gait velocity: reduced Gait velocity interpretation: 1.31 - 2.62 ft/sec, indicative of limited community ambulator General Gait Details: pt with slowed step-through gait, pt does not attempt to follow TDWB through LLE during session despite PT encouragement   Stairs             Wheelchair Mobility    Modified Rankin (Stroke Patients Only)       Balance Overall balance assessment: Needs assistance Sitting-balance support: No upper extremity supported;Feet supported Sitting balance-[name] Scale: Good     Standing balance  support: Single extremity supported;Bilateral upper extremity supported Standing  balance-[name] Scale: Poor Standing balance comment: benefits from UE support of walker                            Cognition Arousal/Alertness: Awake/alert Behavior During Therapy: WFL for tasks assessed/performed Overall Cognitive Status: Impaired/Different from baseline Area of Impairment: Memory;Awareness                     Memory: Decreased recall of precautions     Awareness: Emergent   General Comments: pt demonstrates impaired understanding on need for sternal and WB precautions        Exercises      General Comments General comments (skin integrity, edema, etc.): VSS on RA, tachycardia to 120 with activity      Pertinent Vitals/Pain Pain Assessment: Faces Faces Pain Scale: Hurts even more Pain Location: R groin at incision Pain Descriptors / Indicators: Sore Pain Intervention(s): Monitored during session    Home Living                          Prior Function            PT Goals (current goals can now be found in the care plan section) Acute Rehab PT Goals Patient Stated Goal: return home Progress towards PT goals: Progressing toward goals    Frequency    Min 3X/week      PT Plan Current plan remains appropriate    Co-evaluation              AM-PAC PT "6 Clicks" Mobility   Outcome Measure  Help needed turning from your back to your side while in a flat bed without using bedrails?: A Little Help needed moving from lying on your back to sitting on the side of a flat bed without using bedrails?: A Little Help needed moving to and from a bed to a chair (including a wheelchair)?: A Little Help needed standing up from a chair using your arms (e.g., wheelchair or bedside chair)?: A Little Help needed to walk in hospital room?: A Little Help needed climbing 3-5 steps with a railing? : A Little 6 Click Score: 18    End of Session   Activity Tolerance: Patient tolerated treatment well Patient left: in bed;with  call bell/phone within reach Nurse Communication: Mobility status PT Visit Diagnosis: Other abnormalities of gait and mobility (R26.89);Pain Pain - part of body:  (groin)     Time: 1733-1750 PT Time Calculation (min) (ACUTE ONLY): 17 min  Charges:  $Gait Training: 8-22 mins                     Arlyss Gandy, PT, DPT Acute Rehabilitation Pager: [phone] Office [phone]    Arlyss Gandy 05/28/2021, 6:03 PM

## 2021-05-28 NOTE — Progress Notes (Addendum)
[address]       [place] [address]             [phone]      6 Days Post-Op Procedure(s) (LRB): CORONARY ARTERY BYPASS GRAFTING (CABG) X 3 USING LEFT INTERNAL MAMMARY ARTERY AND RIGHT ENDOSCOPIC GREATER SAPHENOUS VEIN CONDUITS (N/A) TRANSESOPHAGEAL ECHOCARDIOGRAM (TEE) (N/A) APPLICATION OF CELL SAVER ENDOVEIN HARVEST OF GREATER SAPHENOUS VEIN (Right) Subjective: Conts to feel better  Objective: Vital signs in last 24 hours: Temp:  [98.3 F (36.8 C)-98.8 F (37.1 C)] 98.7 F (37.1 C) (11/02 0007) Pulse Rate:  [93-104] 97 (11/02 0300) Cardiac Rhythm: Normal sinus rhythm (11/02 0700) Resp:  [15-21] 19 (11/02 0300) BP: (109-137)/(65-89) 121/72 (11/02 0300) SpO2:  [91 %-95 %] 93 % (11/02 0300) Weight:  [107.5 kg] 107.5 kg (11/02 0300)  Hemodynamic parameters for last 24 hours: CVP:  [7 mmHg-20 mmHg] 10 mmHg  Intake/Output from previous day: 11/01 0701 - 11/02 0700 In: 637.5 [P.O.:630; I.V.:7.5] Out: 2850 [Urine:2850] Intake/Output this shift: No intake/output data recorded.  General appearance: alert, cooperative, and no distress Heart: regular rate and rhythm Lungs: dim left base Abdomen: benign Extremities: no edema Wound: incis healing well  Lab Results: Recent Labs    05/27/21 0415 05/28/21 0418  WBC 8.1 7.8  HGB 8.6* 9.0*  HCT 26.4* 27.6*  PLT 291 330   BMET:  Recent Labs    05/27/21 0415 05/28/21 0418  NA 132* 134*  K 4.5 4.4  CL 102 102  CO2 25 27  GLUCOSE 126* 121*  BUN 36* 28*  CREATININE 1.65* 1.42*  CALCIUM 8.2* 8.3*    PT/INR: No results for input(s): LABPROT, INR in the last 72 hours. ABG    Component Value Date/Time   PHART 7.348 (L) 05/22/2021 1833   HCO3 23.7 05/22/2021 1833   TCO2 25 05/22/2021 1833   ACIDBASEDEF 2.0 05/22/2021 1833   O2SAT 60.6 05/28/2021 0418   CBG (last 3)  Recent Labs    05/27/21 2019 05/28/21 0006 05/28/21 0408  GLUCAP 142* 132* 111*    Meds Scheduled Meds:   amiodarone  200 mg Oral BID   aspirin EC  325 mg Oral Daily   Or   aspirin  324 mg Per Tube Daily   atorvastatin  80 mg Oral Daily   bisacodyl  10 mg Oral Daily   Or   bisacodyl  10 mg Rectal Daily   Chlorhexidine Gluconate Cloth  6 each Topical Daily   Chlorhexidine Gluconate Cloth  6 each Topical Q0600   [place] [facility], Patient & Family Education   Does not apply Once   digoxin  0.125 mg Oral Daily   docusate sodium  200 mg Oral Daily   ferrous sulfate  325 mg Oral Q supper   furosemide  60 mg Intravenous BID   insulin aspart  0-24 Units Subcutaneous Q4H   insulin aspart  2 Units Subcutaneous TID WC   insulin detemir  8 Units Subcutaneous BID   pantoprazole  40 mg Oral Daily   polyethylene glycol  17 g Oral Daily   sodium chloride flush  10-40 mL Intracatheter Q12H   sodium chloride flush  3 mL Intravenous Q12H   sodium chloride flush  3 mL Intravenous Q12H   spironolactone  25 mg Oral Daily   Continuous Infusions:  sodium chloride Stopped (05/23/21 0936)   sodium chloride     sodium chloride 20 mL/hr at 05/22/21 1250  sodium chloride     albumin human     And   sodium chloride     lactated ringers     lactated ringers     lactated ringers Stopped (05/24/21 0003)   PRN Meds:.sodium chloride, sodium chloride, albumin human **AND** sodium chloride, dextrose, fentaNYL (SUBLIMAZE) injection, lactated ringers, metoprolol tartrate, ondansetron (ZOFRAN) IV, sodium chloride flush, sodium chloride flush, sodium chloride flush, traMADol  Xrays DG CHEST PORT 1 VIEW  Result Date: 05/27/2021 CLINICAL DATA:  Shortness of breath 50 year old male, history of pleural effusion. EXAM: PORTABLE CHEST 1 VIEW COMPARISON:  Comparison made with May 24, 2021. FINDINGS: RIGHT IJ vascular sheath again terminating in the proximal superior vena cava. EKG leads projecting over the chest. Post median sternotomy for CABG. Cardiomediastinal contours and hilar structures are stable.  Increased LEFT basilar opacity following removal of chest support tubes present on the previous radiograph. Obscured LEFT hemidiaphragm. No pneumothorax. On limited assessment there is no acute skeletal process. IMPRESSION: Increased LEFT basilar opacity following removal of chest support tubes. Findings likely reflect effusion and basilar volume loss. Correlate with any signs of infection. Electronically Signed   By: Zetta Bills M.D.   On: 05/27/2021 08:40    Assessment/Plan: S/P Procedure(s) (LRB): CORONARY ARTERY BYPASS GRAFTING (CABG) X 3 USING LEFT INTERNAL MAMMARY ARTERY AND RIGHT ENDOSCOPIC GREATER SAPHENOUS VEIN CONDUITS (N/A) TRANSESOPHAGEAL ECHOCARDIOGRAM (TEE) (N/A) APPLICATION OF CELL SAVER ENDOVEIN HARVEST OF GREATER SAPHENOUS VEIN (Right)  1 afeb, VSS, NSR, Co-OX 60- AHF team assisting with medical management- no further VT 2 sats good on RA 3 excellent UOP, weight appears below preop - diuretics as per AHF 4 H/H stable, slightly improved, no further transfusion at this time 5 creat trending lower now 1.42 6 BS adeq control- prob restart home meds at d/c if creat allows 7 cont pulm toilet/rehab 8 repeat CXR in am to monitor effusion 9 [facility]     LOS: 6 days    John Giovanni PA-C Pager [phone] 05/28/2021    Agree with above [place]

## 2021-05-28 NOTE — Progress Notes (Signed)
Received a call from blood bank that blood is ready. Iv team at bedside in process to insert iv line.

## 2021-05-28 NOTE — TOC CM/SW Note (Addendum)
HF TOC CM contacted [employer] rep, Stacie to see if pt was active for [facility]. States pt utilized maximum 3 visits under charity program through the agency. Will arrange [facility] with agency providing charity at dc. Will continue to follow for dc needs. Meds will come up from [facility] pharmacy at dc. Will use MATCH/HF fund for meds.   Isidoro Donning RN3 CCM, Heart Failure TOC CM [phone]

## 2021-05-29 ENCOUNTER — Inpatient Hospital Stay (HOSPITAL_COMMUNITY): Payer: Self-pay

## 2021-05-29 ENCOUNTER — Other Ambulatory Visit (HOSPITAL_COMMUNITY): Payer: Self-pay

## 2021-05-29 ENCOUNTER — Telehealth (HOSPITAL_COMMUNITY): Payer: Self-pay | Admitting: Pharmacy Technician

## 2021-05-29 ENCOUNTER — Other Ambulatory Visit (HOSPITAL_COMMUNITY): Payer: Self-pay | Admitting: *Deleted

## 2021-05-29 LAB — BASIC METABOLIC PANEL
Anion gap: 5 (ref 5–15)
BUN: 22 mg/dL — ABNORMAL HIGH (ref 6–20)
CO2: 28 mmol/L (ref 22–32)
Calcium: 8.2 mg/dL — ABNORMAL LOW (ref 8.9–10.3)
Chloride: 100 mmol/L (ref 98–111)
Creatinine, Ser: 1.38 mg/dL — ABNORMAL HIGH (ref 0.61–1.24)
GFR, Estimated: >60 mL/min (ref >60)
Glucose, Bld: 136 mg/dL — ABNORMAL HIGH (ref 70–99)
Potassium: 4.2 mmol/L (ref 3.5–5.1)
Sodium: 133 mmol/L — ABNORMAL LOW (ref 135–145)

## 2021-05-29 LAB — GLUCOSE, CAPILLARY
Glucose-Capillary: 141 mg/dL — ABNORMAL HIGH (ref 70–99)
Glucose-Capillary: 162 mg/dL — ABNORMAL HIGH (ref 70–99)

## 2021-05-29 LAB — CBC
HCT: 28.2 % — ABNORMAL LOW (ref 39.0–52.0)
Hemoglobin: 9.1 g/dL — ABNORMAL LOW (ref 13.0–17.0)
MCH: 28.3 pg (ref 26.0–34.0)
MCHC: 32.3 g/dL (ref 30.0–36.0)
MCV: 87.9 fL (ref 80.0–100.0)
Platelets: 321 10*3/uL (ref 150–400)
RBC: 3.21 MIL/uL — ABNORMAL LOW (ref 4.22–5.81)
RDW: 14 % (ref 11.5–15.5)
WBC: 8.7 10*3/uL (ref 4.0–10.5)
nRBC: 0 % (ref 0.0–0.2)

## 2021-05-29 LAB — COOXEMETRY PANEL
Carboxyhemoglobin: 1.8 % — ABNORMAL HIGH (ref 0.5–1.5)
Methemoglobin: 0.7 % (ref 0.0–1.5)
O2 Saturation: 58.9 %
Total hemoglobin: 10.4 g/dL — ABNORMAL LOW (ref 12.0–16.0)

## 2021-05-29 IMAGING — DX DG CHEST 2V
2 series · 2 of 2 positions shown · non-contrast
Comparison: [DATE]

CLINICAL DATA: CABG.

EXAM:
CHEST - 2 VIEW

[chest pa]
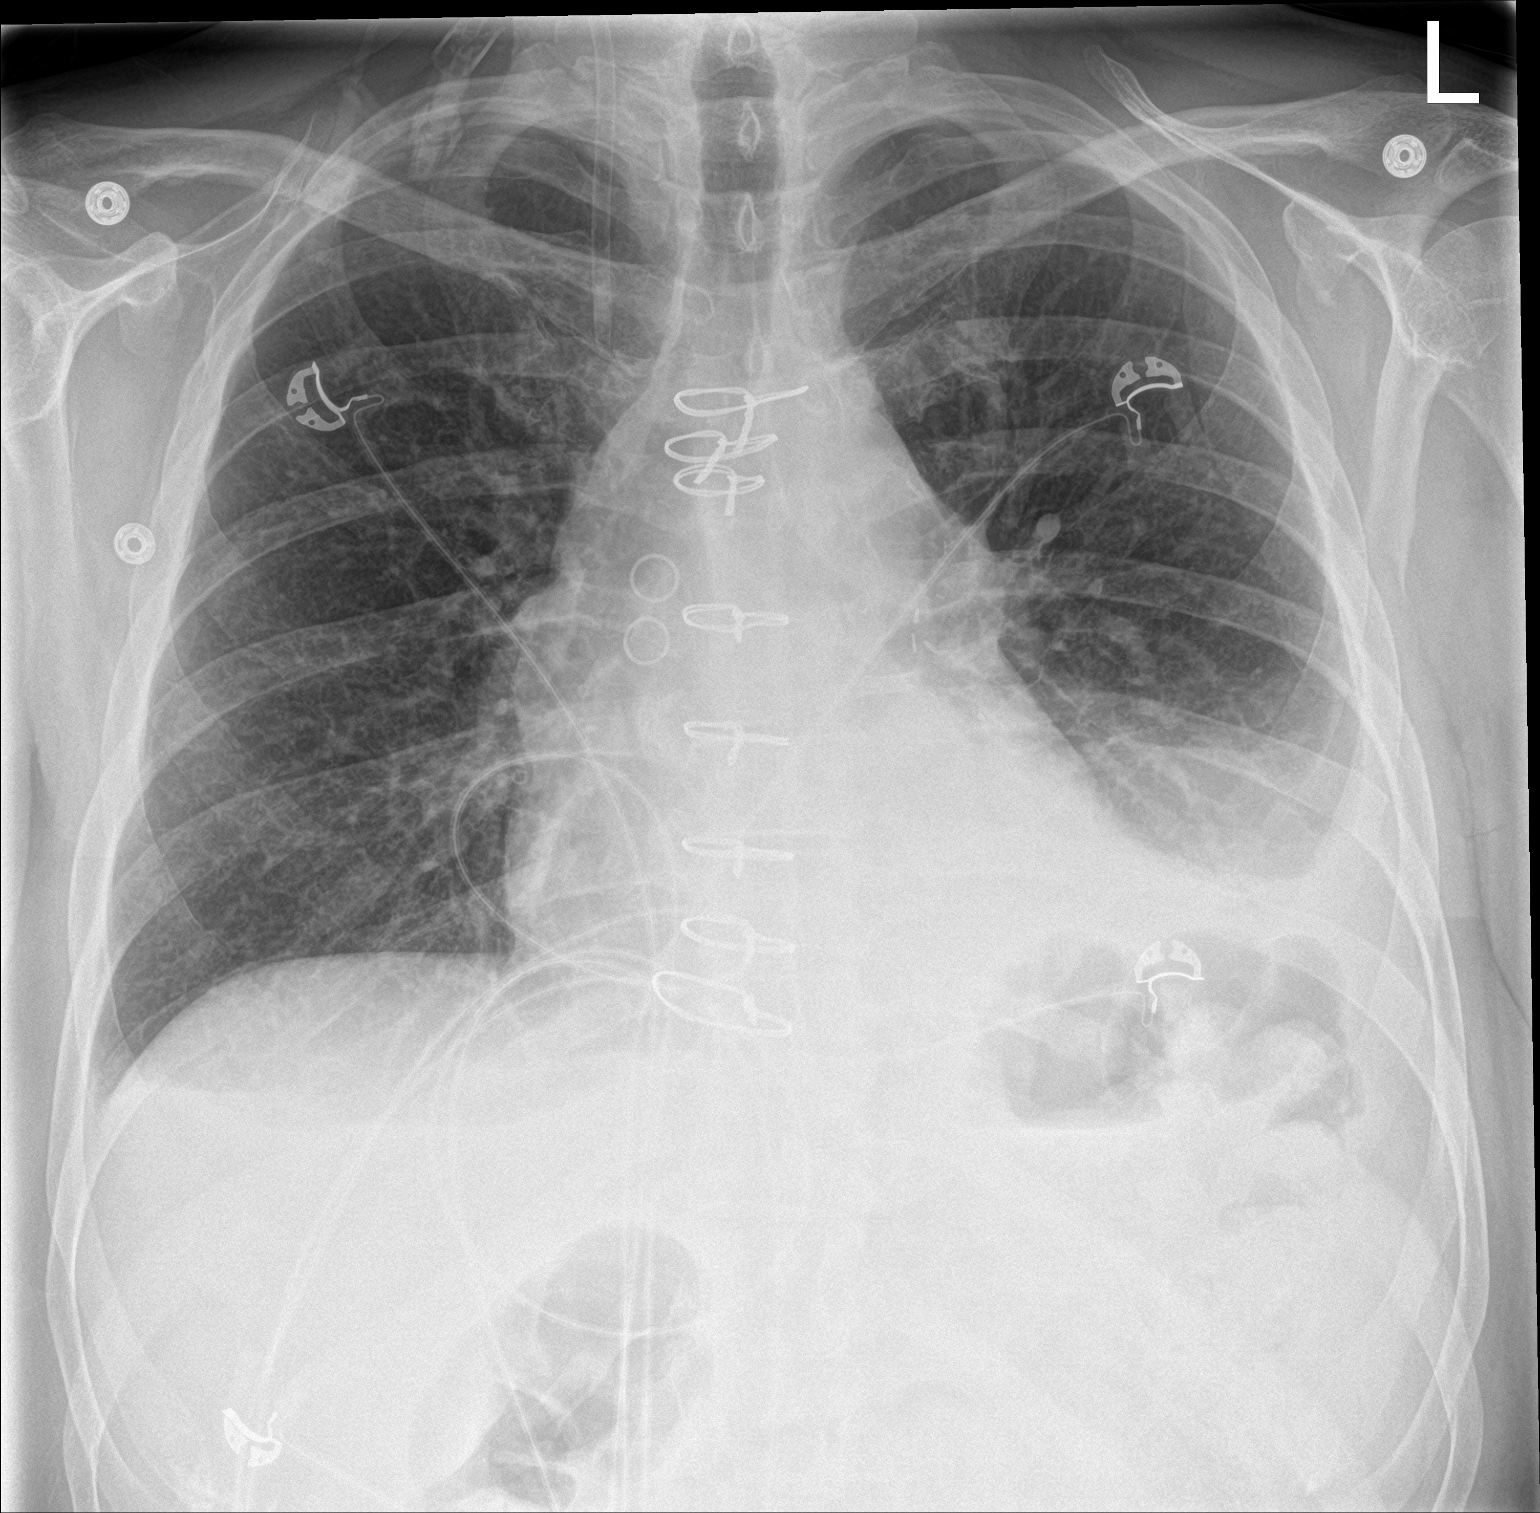

[chest lat]
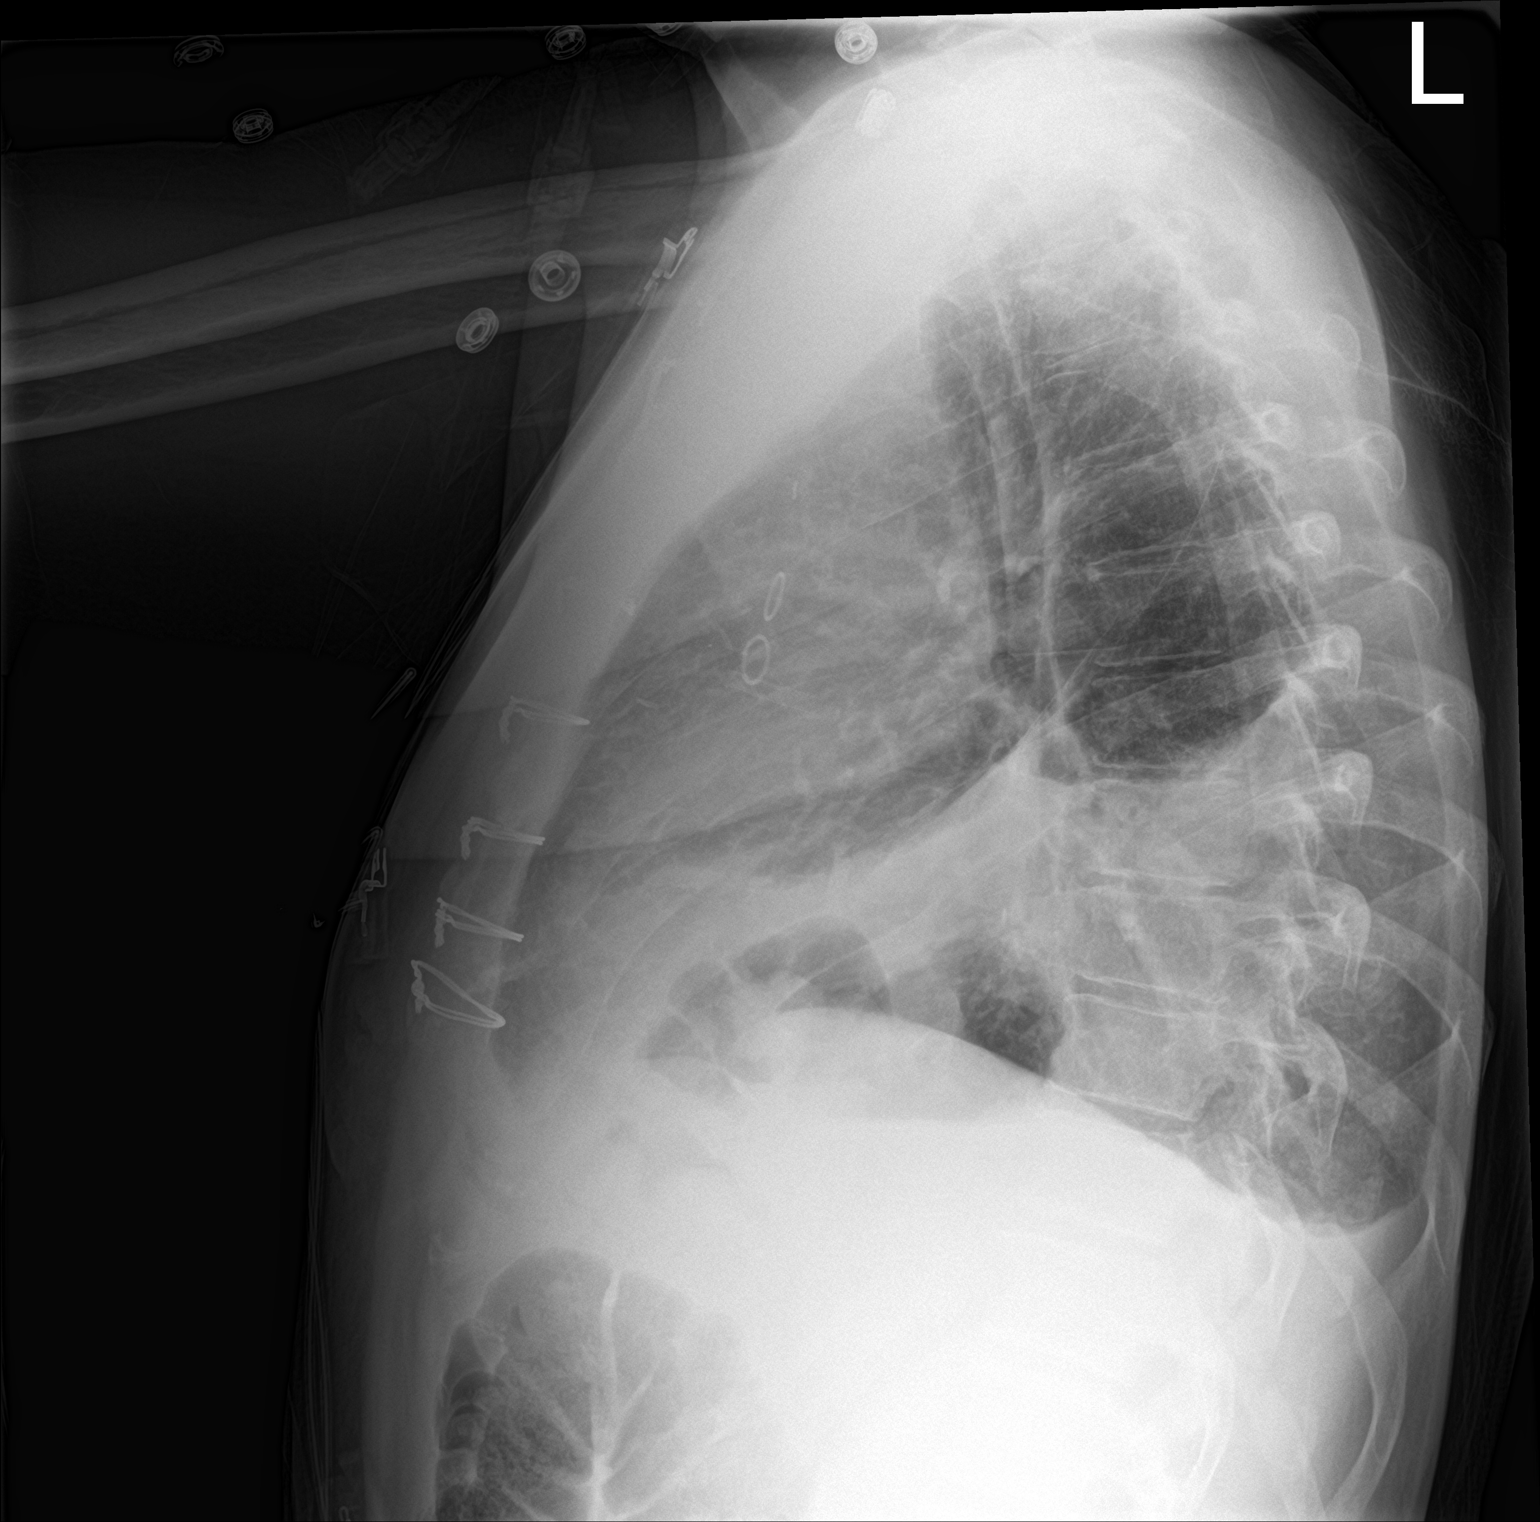

[2 of 2 positions shown; findings below may reference images not displayed]

FINDINGS: Right jugular central venous catheter tip remains in the right
innominate vein unchanged. No pneumothorax

Postop CABG. Left lower lobe atelectasis and small effusion
unchanged. Minimal right pleural effusion. No evidence of heart
failure or edema.
IMPRESSION: Left lower lobe atelectasis and effusion unchanged. Slight right
pleural effusion has developed in the interval. No heart failure or
pneumothorax.

## 2021-05-29 MED ORDER — TORSEMIDE 20 MG PO TABS: 20.0000 mg | ORAL_TABLET | Freq: Every day | ORAL | 0 refills | Status: DC

## 2021-05-29 MED ORDER — DIGOXIN 125 MCG PO TABS
0.1250 mg | ORAL_TABLET | Freq: Every day | ORAL | 1 refills | Status: DC
  Filled 2021-05-29: qty 30, 30d supply, fill #0

## 2021-05-29 MED ORDER — METOPROLOL SUCCINATE ER 25 MG PO TB24: 12.5000 mg | ORAL_TABLET | Freq: Every day | ORAL | Status: DC

## 2021-05-29 MED ORDER — TRAMADOL HCL 50 MG PO TABS
50.0000 mg | ORAL_TABLET | Freq: Four times a day (QID) | ORAL | 0 refills | Status: DC | PRN
  Filled 2021-05-29: qty 28, 7d supply, fill #0

## 2021-05-29 MED ORDER — ASPIRIN 325 MG PO TBEC: 325.0000 mg | DELAYED_RELEASE_TABLET | Freq: Every day | ORAL | 0 refills | Status: DC

## 2021-05-29 MED ORDER — FERROUS SULFATE 325 (65 FE) MG PO TABS: 325.0000 mg | ORAL_TABLET | Freq: Every day | ORAL | 3 refills | Status: DC

## 2021-05-29 MED ORDER — AMIODARONE HCL 200 MG PO TABS
200.0000 mg | ORAL_TABLET | Freq: Every day | ORAL | Status: DC
  Administered 2021-05-29: 200 mg via ORAL
  Filled 2021-05-29: qty 1

## 2021-05-29 MED ORDER — SACUBITRIL-VALSARTAN 24-26 MG PO TABS: 1.0000 | ORAL_TABLET | Freq: Two times a day (BID) | ORAL | 3 refills | Status: DC

## 2021-05-29 MED ORDER — AMIODARONE HCL 200 MG PO TABS
200.0000 mg | ORAL_TABLET | Freq: Every day | ORAL | 1 refills | Status: DC
  Filled 2021-05-29: qty 30, 30d supply, fill #0

## 2021-05-29 MED ORDER — METOPROLOL SUCCINATE ER 25 MG PO TB24
12.5000 mg | ORAL_TABLET | Freq: Every day | ORAL | 1 refills | Status: DC
  Filled 2021-05-29: qty 15, 30d supply, fill #0

## 2021-05-29 MED ORDER — SACUBITRIL-VALSARTAN 24-26 MG PO TABS
1.0000 | ORAL_TABLET | Freq: Two times a day (BID) | ORAL | Status: DC
  Administered 2021-05-29: 1 via ORAL
  Filled 2021-05-29: qty 1

## 2021-05-29 NOTE — Progress Notes (Addendum)
[address]       [place] [address]             [phone]        7 Days Post-Op Procedure(s) (LRB): CORONARY ARTERY BYPASS GRAFTING (CABG) X 3 USING LEFT INTERNAL MAMMARY ARTERY AND RIGHT ENDOSCOPIC GREATER SAPHENOUS VEIN CONDUITS (N/A) TRANSESOPHAGEAL ECHOCARDIOGRAM (TEE) (N/A) APPLICATION OF CELL SAVER ENDOVEIN HARVEST OF GREATER SAPHENOUS VEIN (Right)  Subjective: Patient waiting for a cup of coffee.   Objective: Vital signs in last 24 hours: Temp:  [98.3 F (36.8 C)-98.8 F (37.1 C)] 98.5 F (36.9 C) (11/03 0328) Pulse Rate:  [91-107] 91 (11/03 0328) Cardiac Rhythm: Normal sinus rhythm (11/02 2300) Resp:  [15-22] 16 (11/03 0328) BP: (111-132)/(74-92) 130/89 (11/03 0328) SpO2:  [93 %-94 %] 93 % (11/03 0328) Weight:  [106.9 kg] 106.9 kg (11/03 0608)  Pre op weight  108.9 kg Current Weight  05/29/21 106.9 kg    Hemodynamic parameters for last 24 hours: CVP:  [5 mmHg-6 mmHg] 5 mmHg  Intake/Output from previous day: 11/02 0701 - 11/03 0700 In: 880 [P.O.:880] Out: 2300 [Urine:2300]   Physical Exam:  Cardiovascular: RRR Pulmonary: Clear to auscultation on right;slightly diminished left base Abdomen: Soft, non tender, bowel sounds present. Extremities: Trace RLE;Boot on left lower extremity  Wounds: Clean and dry.  No erythema or signs of infection.  Lab Results: CBC: Recent Labs    05/28/21 0418 05/29/21 0400  WBC 7.8 8.7  HGB 9.0* 9.1*  HCT 27.6* 28.2*  PLT 330 321   BMET:  Recent Labs    05/28/21 0418 05/29/21 0400  NA 134* 133*  K 4.4 4.2  CL 102 100  CO2 27 28  GLUCOSE 121* 136*  BUN 28* 22*  CREATININE 1.42* 1.38*  CALCIUM 8.3* 8.2*    PT/INR:  Lab Results  Component Value Date   INR 1.4 (H) 05/22/2021   INR 1.1 05/20/2021   INR 1.3 (H) 02/21/2021   ABG:  INR: Will add last result for INR, ABG once components are confirmed Will add last 4 CBG results once components are  confirmed  Assessment/Plan: CV-Had VT previously. SR with HR in the 90's this am. On Amiodarone 400 mg bid, Digoxin 0.125 mg daily, Losartan 12.5 mg daily. Co ox this am 58.9 Hope to start weaning Milrinone soon. Pulmonary-on room air.  CXR this am appears stable (left base opacity-small pleural effusion/atelectasis). Will discuss with Dr. Cliffton Asters if needs thoracentesis. Encourage incentive spirometer. Acute on chronic systolic CHF-on Spironolactone 25 mg daily and Torsemide 20 mg daily. AHF following. Expected post op blood loss anemia-H and H this am stable at 9.1 and 28.2. Continue oral Ferrous 5. DM-CBGs 167/157/141. Pre op HGA1C 6.5. On Insulin. Will likely restart Glyburide and Metformin closer/at discharge. 6. CKD (stage II)-creatinine this am slightly decreased to 1.38. Chronic Kidney Disease   Stage I     GFR >90  Stage II    GFR 60-89  Stage IIIA GFR 45-59  Stage IIIB GFR 30-44  Stage IV   GFR 15-29  Stage V    GFR  <15  Lab Results  Component Value Date   CREATININE 1.38 (H) 05/29/2021   Estimated Creatinine Clearance: 86.6 mL/min (A) (by C-G formula based on SCr of 1.38 mg/dL (H)).  8. S/p fifth ray amputation (osteomyelitis). He finished antibiotic earlier this month 9. As discussed with Dr. Cliffton Asters, patient is very deconditioned and would benefit from rehab. Will order PT/OT  consults to obtain SNF   Donielle Liston Alba PA-C 05/29/2021,6:52 AM    Agree with above [place]

## 2021-05-29 NOTE — Progress Notes (Addendum)
Patient ID: Adam James, male   DOB: 1971/06/13, 50 y.o.   MRN: [id number]      Advanced Heart Failure Rounding Note  PCP-Cardiologist: Norman Herrlich, MD   Subjective:    10/27: CABG X 3. VT coming off pump s/p defibrillation  No further VT, now on po amiodarone.   CO-OX remains stable--> 59%.   Creatinine peaked at 2.1 => 1.4 today.   Feels better. Complaining of chest soreness.   Objective:   Weight Range: 106.9 kg Body mass index is 29.46 kg/m.   Vital Signs:   Temp:  [98.3 F (36.8 C)-98.8 F (37.1 C)] 98.5 F (36.9 C) (11/03 0328) Pulse Rate:  [91-107] 91 (11/03 0328) Resp:  [15-22] 16 (11/03 0328) BP: (111-132)/(74-92) 130/89 (11/03 0328) SpO2:  [93 %-94 %] 93 % (11/03 0328) Weight:  [106.9 kg] 106.9 kg (11/03 0608) Last BM Date: 05/27/21  Weight change: Filed Weights   05/27/21 0500 05/28/21 0300 05/29/21 0608  Weight: 112.3 kg 107.5 kg 106.9 kg    Intake/Output:   Intake/Output Summary (Last 24 hours) at 05/29/2021 0728 Last data filed at 05/29/2021 2025 Gross per 24 hour  Intake 880 ml  Output 2300 ml  Net -1420 ml      Physical Exam   General: . No resp difficulty HEENT: normal Neck: supple. NVP 5-6  Carotids 2+ bilat; no bruits. No lymphadenopathy or thryomegaly appreciated. RIJ  Cor: PMI nondisplaced. Regular rate & rhythm. No rubs, gallops or murmurs. Lungs: clear Abdomen: soft, nontender, nondistended. No hepatosplenomegaly. No bruits or masses. Good bowel sounds. Extremities: no cyanosis, clubbing, rash, edema. RLE boot Neuro: alert & orientedx3, cranial nerves grossly intact. moves all 4 extremities w/o difficulty. Affect pleasant  Telemetry   SR-ST 90-100s   Labs    CBC Recent Labs    05/28/21 0418 05/29/21 0400  WBC 7.8 8.7  HGB 9.0* 9.1*  HCT 27.6* 28.2*  MCV 88.5 87.9  PLT 330 321   Basic Metabolic Panel Recent Labs    42/70/62 0418 05/29/21 0400  NA 134* 133*  K 4.4 4.2  CL 102 100  CO2 27 28  GLUCOSE  121* 136*  BUN 28* 22*  CREATININE 1.42* 1.38*  CALCIUM 8.3* 8.2*   Liver Function Tests No results for input(s): AST, ALT, ALKPHOS, BILITOT, PROT, ALBUMIN in the last 72 hours.  No results for input(s): LIPASE, AMYLASE in the last 72 hours. Cardiac Enzymes No results for input(s): CKTOTAL, CKMB, CKMBINDEX, TROPONINI in the last 72 hours.  BNP: BNP (last 3 results) Recent Labs    03/23/21 0045 04/25/21 1505 04/28/21 1047  BNP 1,129.9* 1,691.7* 1,072.2*    ProBNP (last 3 results) Recent Labs    10/07/20 1004  PROBNP 468*     D-Dimer No results for input(s): DDIMER in the last 72 hours. Hemoglobin A1C No results for input(s): HGBA1C in the last 72 hours.  Fasting Lipid Panel No results for input(s): CHOL, HDL, LDLCALC, TRIG, CHOLHDL, LDLDIRECT in the last 72 hours. Thyroid Function Tests No results for input(s): TSH, T4TOTAL, T3FREE, THYROIDAB in the last 72 hours.  Invalid input(s): FREET3  Other results:   Imaging    No results found.   Medications:     Scheduled Medications:  amiodarone  200 mg Oral BID   aspirin EC  325 mg Oral Daily   Or   aspirin  324 mg Per Tube Daily   atorvastatin  80 mg Oral Daily   bisacodyl  10 mg Oral Daily  Or   bisacodyl  10 mg Rectal Daily   Chlorhexidine Gluconate Cloth  6 each Topical Daily   Chlorhexidine Gluconate Cloth  6 each Topical Q0600   [facility], Patient & Family Education   Does not apply Once   digoxin  0.125 mg Oral Daily   docusate sodium  200 mg Oral Daily   ferrous sulfate  325 mg Oral Q supper   insulin aspart  0-24 Units Subcutaneous TID WC   insulin aspart  2 Units Subcutaneous TID WC   insulin detemir  8 Units Subcutaneous BID   losartan  12.5 mg Oral BID   pantoprazole  40 mg Oral Daily   polyethylene glycol  17 g Oral Daily   sodium chloride flush  10-40 mL Intracatheter Q12H   sodium chloride flush  3 mL Intravenous Q12H   sodium chloride flush  3 mL Intravenous Q12H    spironolactone  25 mg Oral Daily   torsemide  20 mg Oral Daily    Infusions:  sodium chloride Stopped (05/23/21 0936)   sodium chloride     sodium chloride 20 mL/hr at 05/22/21 1250   sodium chloride     albumin human     And   sodium chloride     lactated ringers     lactated ringers     lactated ringers Stopped (05/24/21 0003)    PRN Medications: sodium chloride, sodium chloride, albumin human **AND** sodium chloride, dextrose, fentaNYL (SUBLIMAZE) injection, lactated ringers, metoprolol tartrate, ondansetron (ZOFRAN) IV, sodium chloride flush, sodium chloride flush, sodium chloride flush, traMADol    Patient Profile   Mr Donini is a 50 year old with history of CAD s/p PCI/stent 2007 in [place], chronic HFrEF/ICM, CKD Stage IIIa, HTN, hyperlipidemia, DMII, 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    Admitted for scheduled CABG    Assessment/Plan   1. Acute on chronic systolic CHF:  With prior concern for low output HF.  Ischemic cardiomyopathy.  ? If frequent PVCs also contributing. Initial echo in 2/22 EF 40-45%.  Echo in 7/22 EF 30-35%.  Echo in 8/22 EF down to 25% with mild RV dysfunction.  Now s/p CABG, off NE and milrinone.   Co-ox 59%. CVP is not hooked up today. He does not appear volume overloaded.  Renal function stable.   - Continue  torsemide 20 mg daily.  - Add 12.5 mg Toprol XL in the evening.  - Continue spironolactone 25 daily.  - Continue digoxin 0.125 mg daily,   check dig level now.  - Stop losartan and start entresto 24-26 mg twice a day.  2. CAD: LHC in 2/22 with 50% dLM, 70% ramus, 70% pLCx, totally occluded proximal RCA, 90% stenosis ostial to mid LAD, 90% mid LAD, totally occluded distal LAD.  Cardiac MRI in 4/22 showed EF 40% with substantial viable myocardium, only delayed enhancement was subendocardial in the mid to apical inferior wall.  CABG had been planned in 8/22, but patient was admitted left foot  diabetic foot infection that progressed to necrotizing fasciitis s/p debridement and IV abx.  Repeat coronary angiography (9/22) showed minimal change from prior study in 2/22. Now s/p CABG x 3 with LIMA-LAD, SVG-OM1, SVG-OM3.  PDA too small to graft.  - Continue ASA.  - Atorvastatin 80 mg daily.  3. Frequent PVCs/VT perioperatively: Resolved.  Cut back amio 200 mg daily.   4. AKI on CKD Stage 3: Creatinine up to 2.1 post-op.  Likely  a degree of ATN from hypotension peri-op.  Creatinine stable 1.4 today.   5. H/o GI bleeding: History of erosive esophagitis in 8/22.   - Continue Protonix.  6. Left leg diabetic foot infection => necrotizing fasciitis in August 2022.  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 Completed abx. 7. DM2 8. Anemia: post-op, had appropriate response to 1 unit PRBCs on 10/31. Stable 9.1.   Social- Needs help with transportation. Will pass on the HFSW Team. He has been working on disability. Uninsured. Medicaid pending. Has food stamps.     Discussed with pharmacy team to help with medications at discharge. He will need meds filled in TOC.    Amy Clegg NP-C  05/29/2021 7:28 AM  Patient seenw with NP, agree with the above note.   He is doing well today.  CVP has been in the 5 range on po torsemide.  Co-ox 59%.  Creatinine 1.4.   No complaints.   General: NAD Neck: No JVD, no thyromegaly or thyroid nodule.  Lungs: Clear to auscultation bilaterally with normal respiratory effort. CV: Nondisplaced PMI.  Heart regular S1/S2, no S3/S4, no murmur.  No peripheral edema.   Abdomen: Soft, nontender, no hepatosplenomegaly, no distention.  Skin: Intact without lesions or rashes.  Neurologic: Alert and oriented x 3.  Psych: Normal affect. Extremities: No clubbing or cyanosis. Left leg in boot.  HEENT: Normal.   Can continue current torsemide for home.  Stop losartan, start Entresto 24/26 bid.  Add low dose Toprol XL.    Minimal ventricular  ectopy, decrease amiodarone to 200 mg daily.  Would not continue long-term (stop at 1 month post-op if stable).  Should be ready for discharge soon.   Loralie Champagne 05/29/2021 8:24 AM

## 2021-05-29 NOTE — Progress Notes (Signed)
CVC removed per order, patient tolerated well. Site clean dry and intact. Patient resting in bed.

## 2021-05-29 NOTE — Progress Notes (Addendum)
CARDIAC REHAB PHASE I   PRE:  Rate/Rhythm: 99 SR    BP: sitting 129/87    SaO2: 95 RA  MODE:  Ambulation: 270 ft   POST:  Rate/Rhythm: 114 ST    BP: sitting 131/94     SaO2: 95 RA  Discussed with pt the implications for following sternal precautions but he sts he can't and won't follow abide. He likes to be independent and self reliant. Ambulated with RW. Initially wide base due to leg pain. Eventually stepped closer in to RW with increased pressure on arms. Increased distance today, sts 8/10 pain and asked for pain meds, no other c/o. To bed after walk (declines chair). Encouraged IS. He voices some resistance to education. Will f/u tomorrow for ed. He plans to go home. His daughter works but will be there some. He sts he does not know anyone that would/could come to be with him when his daughter is working. PT recommends no f/u. [phone]  Harriet Masson CES, ACSM 05/29/2021 10:21 AM  Addendum: pt discharged without formal education. This morning when the topic of education came up, pt stated he did not need education, that he knew what to eat and how to care for himself. Will not refer for CRPII as he declined instruction and lack of insurance. Ethelda Chick CES, ACSM 1:08 PM 05/29/2021

## 2021-05-29 NOTE — TOC Transition Note (Signed)
Transition of Care [facility]) - CM/SW Discharge Note   Patient Details  Name: Navi Erber MRN: [id number] Date of Birth: [birth year]  Transition of Care [facility]) CM/SW Contact:  Ronin Crager, LCSWA Phone Number: 05/29/2021, 12:02 PM   Clinical Narrative:    HF CSW spoke with Mr. Mccalla at bedside to confirm that he is enrolled in [facility] and provided him again with their number [phone]. Mr. Hoar reported that [facility] gave him a hard time the last time he tried to schedule with them. CSW called [facility] to verify that Mr. Daluz is in fact enrolled with [facility] and they confirmed that he is and the issue was that they were using a 3rd party service which is why the ride was getting cancelled but he is now enrolled with [facility] and not the third party and shouldn't happen again. Mr. Sires has a ride home with his daughter for discharge today. Yesterday the [facility] set him up with home health services.  CSW will sign off for now as social work intervention is no longer needed. Please consult [place] again if new needs arise.   Final next level of care: Home w Home Health Services Barriers to Discharge: No Barriers Identified   Patient Goals and CMS Choice        Discharge Placement                       Discharge Plan and Services                          HH Arranged: RN [facility] Agency: [facility]        Social Determinants of Health (SDOH) Interventions     Readmission Risk Interventions No flowsheet data found.    Wilhemina Grall, MSW, LCSWA [phone] Heart Failure Social Worker

## 2021-05-29 NOTE — Progress Notes (Signed)
April RN aware of order to d/c [facility] 

## 2021-05-29 NOTE — Progress Notes (Signed)
Discharge instructions given to patient, he understands all follow up appointments and med changes. 1 PIV removed from left hand without complication. Patients daughter transporting patient home.

## 2021-05-29 NOTE — Telephone Encounter (Signed)
Advanced Heart Failure Patient Advocate Encounter  Patient is being discharged on Entresto and is currently uninsured. Started an Landscape architect for [employer] assistance.  Will fax in once signatures are obtained.

## 2021-05-29 NOTE — TOC Transition Note (Addendum)
Transition of Care [facility]) - CM/SW Discharge Note   Patient Details  Name: Tarrell Debes MRN: [id number] Date of Birth: 21-Oct-1970  Transition of Care [facility]) CM/SW Contact:  Leone Haven, RN Phone Number: 05/29/2021, 12:53 PM   Clinical Narrative:    NCM contacted Pearson Grippe with [facility] for charity [facility],  she spoke with patient and he told her he was  not sure what his household income is he will have to ask his daughter about it and her to call him back.   NCM tried to speak with him but he has already left. Will try to contact his daughter. NCM contacted daughter she states household income is 0. She is not working.  NCM relayed this information to [place] with [facility]. Pearson Grippe states she was able to get the [facility] set up for him.   Final next level of care: Home w Home Health Services Barriers to Discharge: Inadequate or no insurance   Patient Goals and CMS Choice        Discharge Placement                       Discharge Plan and Services                          HH Arranged: RN [facility] Agency: [facility] Date HH Agency Contacted: 05/29/21 Time HH Agency Contacted: 1253 Representative spoke with at [facility] Agency: Pearson Grippe  Social Determinants of Health (SDOH) Interventions     Readmission Risk Interventions No flowsheet data found.

## 2021-05-29 NOTE — Telephone Encounter (Signed)
Found out patient has [employer] [employer] approval through July 2023. Sent 90 day RX request to [facility] to send in.   No need to apply for manufacturer assistance at this time.  Archer Asa, CPhT

## 2021-05-30 ENCOUNTER — Telehealth: Payer: Medicaid Other | Admitting: Thoracic Surgery (Cardiothoracic Vascular Surgery)

## 2021-05-30 ENCOUNTER — Other Ambulatory Visit (HOSPITAL_COMMUNITY): Payer: Self-pay | Admitting: *Deleted

## 2021-05-30 MED ORDER — SACUBITRIL-VALSARTAN 24-26 MG PO TABS
1.0000 | ORAL_TABLET | Freq: Two times a day (BID) | ORAL | 3 refills | Status: AC
Start: 2021-05-30 — End: 2021-06-29

## 2021-06-03 ENCOUNTER — Telehealth: Payer: Self-pay | Admitting: Licensed Clinical Social Worker

## 2021-06-03 NOTE — Telephone Encounter (Signed)
Called to touch base with pt this afternoon to confirm he is aware of appt w/ Heart Failure team on 11/10 and that he is aware he needs to schedule a ride. He was reached at [phone], he is aware and will bring his medications and any questions- plans to call Transportation Services tomorrow, I encouraged him to call sooner since he does not live in [place] to schedule ride. No additional questions/concerns at this time, states he has an eye exam tomorrow for his disability claim and is aware Medicaid has been submitted for him.   Octavio Graves, MSW, LCSW [facility] Heart/Vascular Care Navigation  [phone]

## 2021-06-04 ENCOUNTER — Telehealth (HOSPITAL_COMMUNITY): Payer: Self-pay

## 2021-06-04 NOTE — Telephone Encounter (Signed)
Called and was not able to leave a voice message to confirm/remind patient of their appointment at the Advanced Heart Failure Clinic on 06/05/21.

## 2021-06-05 ENCOUNTER — Encounter (HOSPITAL_COMMUNITY): Payer: Medicaid Other

## 2021-06-06 ENCOUNTER — Other Ambulatory Visit: Payer: Self-pay

## 2021-06-06 ENCOUNTER — Ambulatory Visit (INDEPENDENT_AMBULATORY_CARE_PROVIDER_SITE_OTHER): Payer: Self-pay | Admitting: Thoracic Surgery (Cardiothoracic Vascular Surgery)

## 2021-06-06 DIAGNOSIS — Z951 Presence of aortocoronary bypass graft: Secondary | ICD-10-CM

## 2021-06-06 NOTE — Progress Notes (Signed)
     [address]       [name] [address]             [phone]       Patient: Home Provider: Office Consent for Telemedicine visit obtained.  Today's visit was completed via a real-time telehealth (see specific modality noted below). The patient/authorized person provided oral consent at the time of the visit to engage in a telemedicine encounter with the present provider at [facility]. The patient/authorized person was informed of the potential benefits, limitations, and risks of telemedicine. The patient/authorized person expressed understanding that the laws that protect confidentiality also apply to telemedicine. The patient/authorized person acknowledged understanding that telemedicine does not provide emergency services and that he or she would need to call 911 or proceed to the nearest hospital for help if such a need arose.   Total time spent in the clinical discussion 10 minutes.  Telehealth Modality: Phone visit (audio only)  I had a telephone visit with  Markees Carns who is s/p CABG.  Overall doing well.   Pain is minimal.  Ambulating well.  He missed his appointment with heart care.   I have made another referral to heart care to see him in next week.  He is scheduled to follow-up with heart failure clinic in the beginning of December.  Haru Shaff will see [place] back in 1 month with a chest x-ray for cardiac rehab clearance.  Lianne Carreto Keane Scrape

## 2021-07-01 ENCOUNTER — Other Ambulatory Visit: Payer: Self-pay

## 2021-07-01 ENCOUNTER — Ambulatory Visit (INDEPENDENT_AMBULATORY_CARE_PROVIDER_SITE_OTHER): Payer: Self-pay | Admitting: Orthopedic Surgery

## 2021-07-01 DIAGNOSIS — M726 Necrotizing fasciitis: Secondary | ICD-10-CM

## 2021-07-01 DIAGNOSIS — M86272 Subacute osteomyelitis, left ankle and foot: Secondary | ICD-10-CM

## 2021-07-01 DIAGNOSIS — Z89422 Acquired absence of other left toe(s): Secondary | ICD-10-CM

## 2021-07-04 ENCOUNTER — Ambulatory Visit (HOSPITAL_COMMUNITY)
Admission: RE | Admit: 2021-07-04 | Discharge: 2021-07-04 | Disposition: A | Discharge status: Home / Self Care | Payer: Medicaid Other | Source: Ambulatory Visit | Attending: Cardiology | Admitting: Cardiology

## 2021-07-04 ENCOUNTER — Encounter (HOSPITAL_COMMUNITY): Payer: Self-pay | Admitting: Cardiology

## 2021-07-04 ENCOUNTER — Other Ambulatory Visit: Payer: Self-pay

## 2021-07-04 VITALS — BP 80/50 | HR 99

## 2021-07-04 DIAGNOSIS — I2510 Atherosclerotic heart disease of native coronary artery without angina pectoris: Secondary | ICD-10-CM | POA: Insufficient documentation

## 2021-07-04 DIAGNOSIS — Z7982 Long term (current) use of aspirin: Secondary | ICD-10-CM | POA: Insufficient documentation

## 2021-07-04 DIAGNOSIS — Z951 Presence of aortocoronary bypass graft: Secondary | ICD-10-CM | POA: Insufficient documentation

## 2021-07-04 DIAGNOSIS — I130 Hypertensive heart and chronic kidney disease with heart failure and stage 1 through stage 4 chronic kidney disease, or unspecified chronic kidney disease: Secondary | ICD-10-CM | POA: Insufficient documentation

## 2021-07-04 DIAGNOSIS — E11628 Type 2 diabetes mellitus with other skin complications: Secondary | ICD-10-CM | POA: Insufficient documentation

## 2021-07-04 DIAGNOSIS — E1122 Type 2 diabetes mellitus with diabetic chronic kidney disease: Secondary | ICD-10-CM | POA: Insufficient documentation

## 2021-07-04 DIAGNOSIS — E785 Hyperlipidemia, unspecified: Secondary | ICD-10-CM | POA: Insufficient documentation

## 2021-07-04 DIAGNOSIS — L089 Local infection of the skin and subcutaneous tissue, unspecified: Secondary | ICD-10-CM | POA: Insufficient documentation

## 2021-07-04 DIAGNOSIS — N183 Chronic kidney disease, stage 3 unspecified: Secondary | ICD-10-CM | POA: Insufficient documentation

## 2021-07-04 DIAGNOSIS — I5022 Chronic systolic (congestive) heart failure: Secondary | ICD-10-CM

## 2021-07-04 DIAGNOSIS — I5042 Chronic combined systolic (congestive) and diastolic (congestive) heart failure: Secondary | ICD-10-CM

## 2021-07-04 DIAGNOSIS — Z79899 Other long term (current) drug therapy: Secondary | ICD-10-CM | POA: Insufficient documentation

## 2021-07-04 LAB — COMPREHENSIVE METABOLIC PANEL
ALT: 12 U/L (ref 0–44)
AST: 17 U/L (ref 15–41)
Albumin: 2.6 g/dL — ABNORMAL LOW (ref 3.5–5.0)
Alkaline Phosphatase: 95 U/L (ref 38–126)
Anion gap: 9 (ref 5–15)
BUN: 29 mg/dL — ABNORMAL HIGH (ref 6–20)
CO2: 23 mmol/L (ref 22–32)
Calcium: 8.2 mg/dL — ABNORMAL LOW (ref 8.9–10.3)
Chloride: 97 mmol/L — ABNORMAL LOW (ref 98–111)
Creatinine, Ser: 1.94 mg/dL — ABNORMAL HIGH (ref 0.61–1.24)
GFR, Estimated: 42 mL/min — ABNORMAL LOW (ref >60)
Glucose, Bld: 259 mg/dL — ABNORMAL HIGH (ref 70–99)
Potassium: 4.4 mmol/L (ref 3.5–5.1)
Sodium: 129 mmol/L — ABNORMAL LOW (ref 135–145)
Total Bilirubin: 0.7 mg/dL (ref 0.3–1.2)
Total Protein: 6.3 g/dL — ABNORMAL LOW (ref 6.5–8.1)

## 2021-07-04 LAB — BRAIN NATRIURETIC PEPTIDE: B Natriuretic Peptide: 180.2 pg/mL — ABNORMAL HIGH (ref 0.0–100.0)

## 2021-07-04 LAB — TSH: TSH: 4.869 u[IU]/mL — ABNORMAL HIGH (ref 0.350–4.500)

## 2021-07-04 LAB — DIGOXIN LEVEL: Digoxin Level: <0.2 ng/mL — ABNORMAL LOW (ref 0.8–2.0)

## 2021-07-04 MED ORDER — AMIODARONE HCL 200 MG PO TABS
200.0000 mg | ORAL_TABLET | Freq: Every day | ORAL | 3 refills | Status: DC
Start: 2021-07-04 — End: 2021-07-09

## 2021-07-04 MED ORDER — METOPROLOL SUCCINATE ER 25 MG PO TB24: 12.5000 mg | ORAL_TABLET | Freq: Every day | ORAL | 3 refills | Status: DC

## 2021-07-04 MED ORDER — DIGOXIN 125 MCG PO TABS: 0.1250 mg | ORAL_TABLET | Freq: Every day | ORAL | 3 refills | Status: DC

## 2021-07-04 MED ORDER — ATORVASTATIN CALCIUM 80 MG PO TABS: 80.0000 mg | ORAL_TABLET | Freq: Every day | ORAL | 3 refills | Status: DC

## 2021-07-04 MED ORDER — TORSEMIDE 20 MG PO TABS: 40.0000 mg | ORAL_TABLET | Freq: Every day | ORAL | 3 refills | Status: DC

## 2021-07-04 MED ORDER — SPIRONOLACTONE 25 MG PO TABS: 25.0000 mg | ORAL_TABLET | Freq: Every day | ORAL | 3 refills | Status: DC

## 2021-07-04 NOTE — Patient Instructions (Signed)
Medication Changes:  Restart all meds, EXCEPT ENTRESTO, prescriptions have been sent in  Lab Work:  Done today, we will call you for abnormal results  Testing/Procedures:  Your physician has requested that you have an echocardiogram. Echocardiography is a painless test that uses sound waves to create images of your heart. It provides your doctor with information about the size and shape of your heart and how well your heart's chambers and valves are working. This procedure takes approximately one hour. There are no restrictions for this procedure. [place]  Referrals:  NONE  Special Instructions // Education:  Do the following things EVERYDAY: Weigh yourself in the morning before breakfast. Write it down and keep it in a log. Take your medicines as prescribed Eat low salt foods--Limit salt (sodium) to 2000 mg per day.  Stay as active as you can everyday Limit all fluids for the day to less than 2 liters   Follow-Up in: 1 week  At the Advanced Heart Failure Clinic, you and your health needs are our priority. We have a designated team specialized in the treatment of Heart Failure. This Care Team includes your primary Heart Failure Specialized Cardiologist (physician), Advanced Practice Providers (APPs- Physician Assistants and Nurse Practitioners), and Pharmacist who all work together to provide you with the care you need, when you need it.   You may see any of the following providers on your designated Care Team at your next follow up:  Dr Arvilla Meres Dr Carron Curie, NP Robbie Lis, [name] [facility], [name] Karle Plumber, PharmD   Please be sure to bring in all your medications bottles to every appointment.   Need to Contact us:  If you have any questions or concerns before your next appointment please send [place] a message through [place] or call our office at [phone].    TO LEAVE A MESSAGE FOR THE NURSE SELECT OPTION 2, PLEASE  LEAVE A MESSAGE INCLUDING: YOUR NAME DATE OF BIRTH CALL BACK NUMBER REASON FOR CALL**this is important as we prioritize the call backs  YOU WILL RECEIVE A CALL BACK THE SAME DAY AS LONG AS YOU CALL BEFORE 4:00 PM

## 2021-07-04 NOTE — Progress Notes (Signed)
CSW scheduled ride home for pt using [facility]   Burna Sis, LCSW Clinical Social Worker Advanced [facility] Failure Clinic Desk#: [phone] Cell#: [phone]

## 2021-07-06 NOTE — Progress Notes (Signed)
Advanced Heart Failure Clinic Note    PCP: Pcp, No PCP-Cardiologist: Adam More, MD  HF Cardiologist: Dr. Aundra James  HPI: [name] is a 50 y.o.with a history CAD, Had PCI 2007 in [place]. 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   Admitted to [facility] in February 2022 with lower extremity edema and foot ulcer. BNP and HS Trop elevated and felt to be non-ACS.  Cardiology consulted and he was placed on lasix.    He had follow up with Dr Adam James 09/09/20 and was set up for cath. Cath with multivessel CAD. Saw Dr Adam James in the office with plans for CMRI. He was referred to SW due to financial concerns and no payor source. CMRI showed EF 40% and viable myocardium.     Saw Dr Adam James in July 2022 and was set up for CABG in August. Pre CABG labs completed and showed WBC 25. He was advised to go to the ED.    Admitted 02/25/2021 with left foot infection complicated by osteomyelitis and necrotizing fasciitis. 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 Hospital course complicated by erosive esophagitis on EGD. Recommendations for protonix 40 mg bid + repeat EGD in 8 weeks. Received multiple transfusions. Discharged 03/15/21.    Admitted 03/23/21 with A/C CHF exacerbation. Started on IV lasix. Cardiology consulted. CO-OX 55%. James 80 mg IV x1. V lasix increased to 100 mg tid and started on metolazone.  Repeat echo on 03/23/21 with LVEF of 25%, LV demonstrating global hypokinesis, and LV diastolic parameters consistent with grade II diastolic dysfunction. RHC performed 03/26/21 showed preserved cardiac output, mildly elevated PCWP, and mild pulmonary venous hypertension. LHC performed on 03/28/21 showed  minimal change from prior study in 2/22. Ost LAD to Mid LAD 90% stenosed, mid LAD to Dist LAD 90% stenosed, dist LAD lesion 100% stenosed.prox Cx  70% stenosed, prox RCA to Mid RCA 100% stenosed, dist RCA 100% stenosed, ramus 70% stenosed, 1st Mrg 50% stenosed, mid LM to Dist LM 60% stenosed. Hospitalization c/b AKI on CKD 3.   After full treatment of foot infection, he was admitted in 10/22 for CABG with LIMA-LAD, SVG-OM1, SVG-OM3.  PDA too small to graft.   He returns today for followup of CHF, he missed his initial post-hospital appt.  He has not smoked since CABG.  He is living with his daughter.  He is wearing a boot on his left foot. Dr. Sharol James is following the wound. He walks around the house without problems.  He is short of breath walking longer distances.  Today, BP is quite low.  He is lightheaded but has not passed out.  Prior to today, BP has been normal.  No chest pain, no orthopnea/PND.    Labs (11/22): K 4.2, creatinine 1.38  ECG (personally reviewed): NSR, LVH with repolarization abnormality  PMH: 1. GI bleeding: Erosive esophagitis 8/22.  2. CKD stage 3 3. Type 2 diabetes 4. Hyperlipidemia 5. Left foot/leg diabetic infection with necrotizing fasciitis: 8/22, debridements were done and the left 5th metatarsal was amputated.   6. Prior smoker 7. H/o PVC, VT: On amiodarone.  8. CAD: LHC in 9/22 with severe 3 vessel disease.  - CABG (10/22) with LIMA-LAD, SVG-OM2, SVG-OM3.  9. Chronic systolic CHF: Ischemic cardiomyopathy.  - Echo (8/22): LVEF of 25%, LV demonstrating global hypokinesis, and LV diastolic parameters consistent with grade II diastolic dysfunction.   Review of Systems: All systems reviewed  and negative except as per HPI.    Current Outpatient Medications  Medication Sig Dispense Refill   aspirin EC 325 MG EC tablet Take 1 tablet (325 mg total) by mouth daily. 30 tablet 0   glucose blood (TRUE METRIX BLOOD GLUCOSE TEST) test strip Use as instructed twice daily 100 each 12   glyBURIDE (DIABETA) 5 MG tablet Take 1 tablet (5 mg total) by mouth 2 (two) times daily. 60 tablet 1   metFORMIN (GLUCOPHAGE) 500 MG  tablet Take 1 tablet (500 mg total) by mouth 2 (two) times daily. 60 tablet 1   traMADol (ULTRAM) 50 MG tablet Take 1 tablet (50 mg total) by mouth every 6 (six) hours as needed for moderate pain. 28 tablet 0   TRUEplus Lancets 28G MISC Use as directed twice daily at 8 am and 10 pm. 100 each 11   amiodarone (PACERONE) 200 MG tablet Take 1 tablet (200 mg total) by mouth daily. 30 tablet 3   atorvastatin (LIPITOR) 80 MG tablet Take 1 tablet (80 mg total) by mouth daily. 30 tablet 3   digoxin (LANOXIN) 0.125 MG tablet Take 1 tablet (0.125 mg total) by mouth daily. 30 tablet 3   ferrous sulfate 325 (65 FE) MG tablet Take 1 tablet (325 mg total) by mouth daily with supper. For one month then stop. If develops constipation, may take stool softener or stop ferrous before one month (Patient not taking: Reported on 07/04/2021)  3   metoprolol succinate (TOPROL-XL) 25 MG 24 hr tablet Take 1/2 tablet (12.5 mg total) by mouth at bedtime. 30 tablet 3   spironolactone (ALDACTONE) 25 MG tablet Take 1 tablet (25 mg total) by mouth daily. 30 tablet 3   torsemide (DEMADEX) 20 MG tablet Take 2 tablets (40 mg total) by mouth daily. 60 tablet 3   No current facility-administered medications for this encounter.   Allergies  Allergen Reactions   Morphine Itching   Social History   Socioeconomic History   Marital status: Single    Spouse name: Not on file   Number of children: 1   Years of education: Not on file   Highest education level: 8th grade  Occupational History   Occupation: none    Comment: former "[employer]"  Tobacco Use   Smoking status: Former    Packs/day: 3.00    Years: 36.00    Pack years: 108.00    Types: Cigarettes    Quit date: 08/27/2020    Years since quitting: 0.8   Smokeless tobacco: Never   Tobacco comments:    5-6 daily  Vaping Use   Vaping Use: Never used  Substance and Sexual Activity   Alcohol use: Not Currently    Comment: Very rare   Drug use: Not Currently    Types:  Marijuana   Sexual activity: Not on file  Other Topics Concern   Not on file  Social History Narrative   Not on file   Social Determinants of Health   Financial Resource Strain: High Risk   Difficulty of Paying Living Expenses: Very hard  Food Insecurity: No Food Insecurity   Worried About Programme researcher, broadcasting/film/video in the Last Year: Never true   Ran Out of Food in the Last Year: Never true  Transportation Needs: Unmet Transportation Needs   Lack of Transportation (Medical): Yes   Lack of Transportation (Non-Medical): Yes  Physical Activity: Not on file  Stress: Not on file  Social Connections: Not on file  Intimate Partner Violence:  Not on file   Family History  Problem Relation Age of Onset   Hypertension Mother    Heart failure Father    Hypertension Brother    Congestive Heart Failure Brother    BP (!) 80/50   Pulse 99   SpO2 99%   Wt Readings from Last 3 Encounters:  05/29/21 106.9 kg (235 lb 10.8 oz)  05/20/21 108.9 kg (240 lb)  05/16/21 110.2 kg (243 lb)   PHYSICAL EXAM: General: NAD Neck: No JVD, no thyromegaly or thyroid nodule.  Lungs: Clear to auscultation bilaterally with normal respiratory effort. CV: Nondisplaced PMI.  Heart regular S1/S2, no S3/S4, no murmur.  No peripheral edema.  No carotid bruit.  Difficult to palpate pedal pulses.  Abdomen: Soft, nontender, no hepatosplenomegaly, no distention.  Skin: Intact without lesions or rashes.  Neurologic: Alert and oriented x 3.  Psych: Normal affect. Extremities: No clubbing or cyanosis. Left lower leg in boot.  HEENT: Normal.   ASSESSMENT & PLAN: 1. CAD: LHC in 2/22 with 50% dLM, 70% ramus, 70% pLCx, totally occluded proximal RCA, 90% stenosis ostial to mid LAD, 90% mid LAD, totally occluded distal LAD.  Cardiac MRI in 4/22 showed EF 40% with substantial viable myocardium, only delayed enhancement was subendocardial in the mid to apical inferior wall.  With this information, CABG had been planned in 8/22, but  patient was admitted prior with left foot diabetic foot infection that progressed to necrotizing fasciitis.  Patient is now s/p extensive left lower leg debridement.  Repeat coronary angiography (9/22) showed minimal change from prior study in 2/22.  CABG x 3 with LIMA-LAD, SVG-OM2, and SVG-OM3 was finally done in 10/22. No chest pain.  - Continue ASA 81 daily.  - Continue atorvastatin 80 mg daily. Check lipids next appt.  2. Chronic systolic CHF: Ischemic cardiomyopathy.  Initial echo in 2/22 with EF 40-45%.  Echo in 7/22 with EF 30-35%.  Echo in 8/22 with EF down to 25% with mild RV dysfunction and dilated IVC.  [place] 8/22 with preserved CO, mildly elevated PCWP, mild pulmonary hypertension.  NYHA class II symptoms, James limited by left leg.  BP is low today with lightheadedness though he reports that it has not been low prior. He is not volume overloaded on exam.  - Continue torsemide 40 mg daily.  BMET/BNP today.  - Continue spironolactone 25 mg daily.  - Continue Toprol XL 12.5 mg qhs  - Continue digoxin 0.125 mg daily.  Check digoxin level.  - With very low BP, I will have him stop Entresto for now.  Follow BP going forwards to see if he will be able to restart.  - Have avoided SGLT2i with history of necrotizing fasciitis, but think he may be able to start in future.  - Echo in 2/23 to decide on ICD.  3. CKD: Stage 3.  BMET today. 4. GI bleeding: History of erosive esophagitis earlier in 8/22.  He is now on Protonix.   - CBC today. 5. 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  - Follows with Dr. Sharol James for wound care. - Needs PT at home to help mobilize him.  6. DM2: Needs a PCP/endocrinologist.   With markedly low BP today, I am going to have him stop Entresto and followup with APP next week.  He will need echo in 2/23 with ICD if EF remains low.    Loralie Champagne, MD  07/06/21  

## 2021-07-06 NOTE — Addendum Note (Signed)
Encounter addended by: Laurey Morale, MD on: 07/06/2021 11:37 PM  Actions taken: Level of Service modified, Visit diagnoses modified, Clinical Note Signed

## 2021-07-07 ENCOUNTER — Ambulatory Visit (INDEPENDENT_AMBULATORY_CARE_PROVIDER_SITE_OTHER): Payer: Self-pay | Admitting: Physician Assistant

## 2021-07-07 ENCOUNTER — Telehealth (HOSPITAL_COMMUNITY): Payer: Self-pay | Admitting: Licensed Clinical Social Worker

## 2021-07-07 ENCOUNTER — Other Ambulatory Visit: Payer: Self-pay

## 2021-07-07 ENCOUNTER — Ambulatory Visit
Admission: RE | Admit: 2021-07-07 | Discharge: 2021-07-07 | Disposition: A | Discharge status: Home / Self Care | Payer: Medicaid Other | Source: Ambulatory Visit | Attending: Thoracic Surgery (Cardiothoracic Vascular Surgery) | Admitting: Thoracic Surgery (Cardiothoracic Vascular Surgery)

## 2021-07-07 ENCOUNTER — Other Ambulatory Visit: Payer: Self-pay | Admitting: Thoracic Surgery (Cardiothoracic Vascular Surgery)

## 2021-07-07 VITALS — BP 77/52 | HR 110 | Resp 20 | Ht 75.0 in | Wt 223.0 lb

## 2021-07-07 DIAGNOSIS — Z951 Presence of aortocoronary bypass graft: Secondary | ICD-10-CM

## 2021-07-07 IMAGING — DX DG CHEST 2V
2 series · 2 of 2 positions shown · non-contrast
Comparison: Chest radiographs [DATE]

CLINICAL DATA: Status post CABG.  Shortness of breath.

EXAM:
CHEST - 2 VIEW

[dg chest 2 view (1 of 2)]
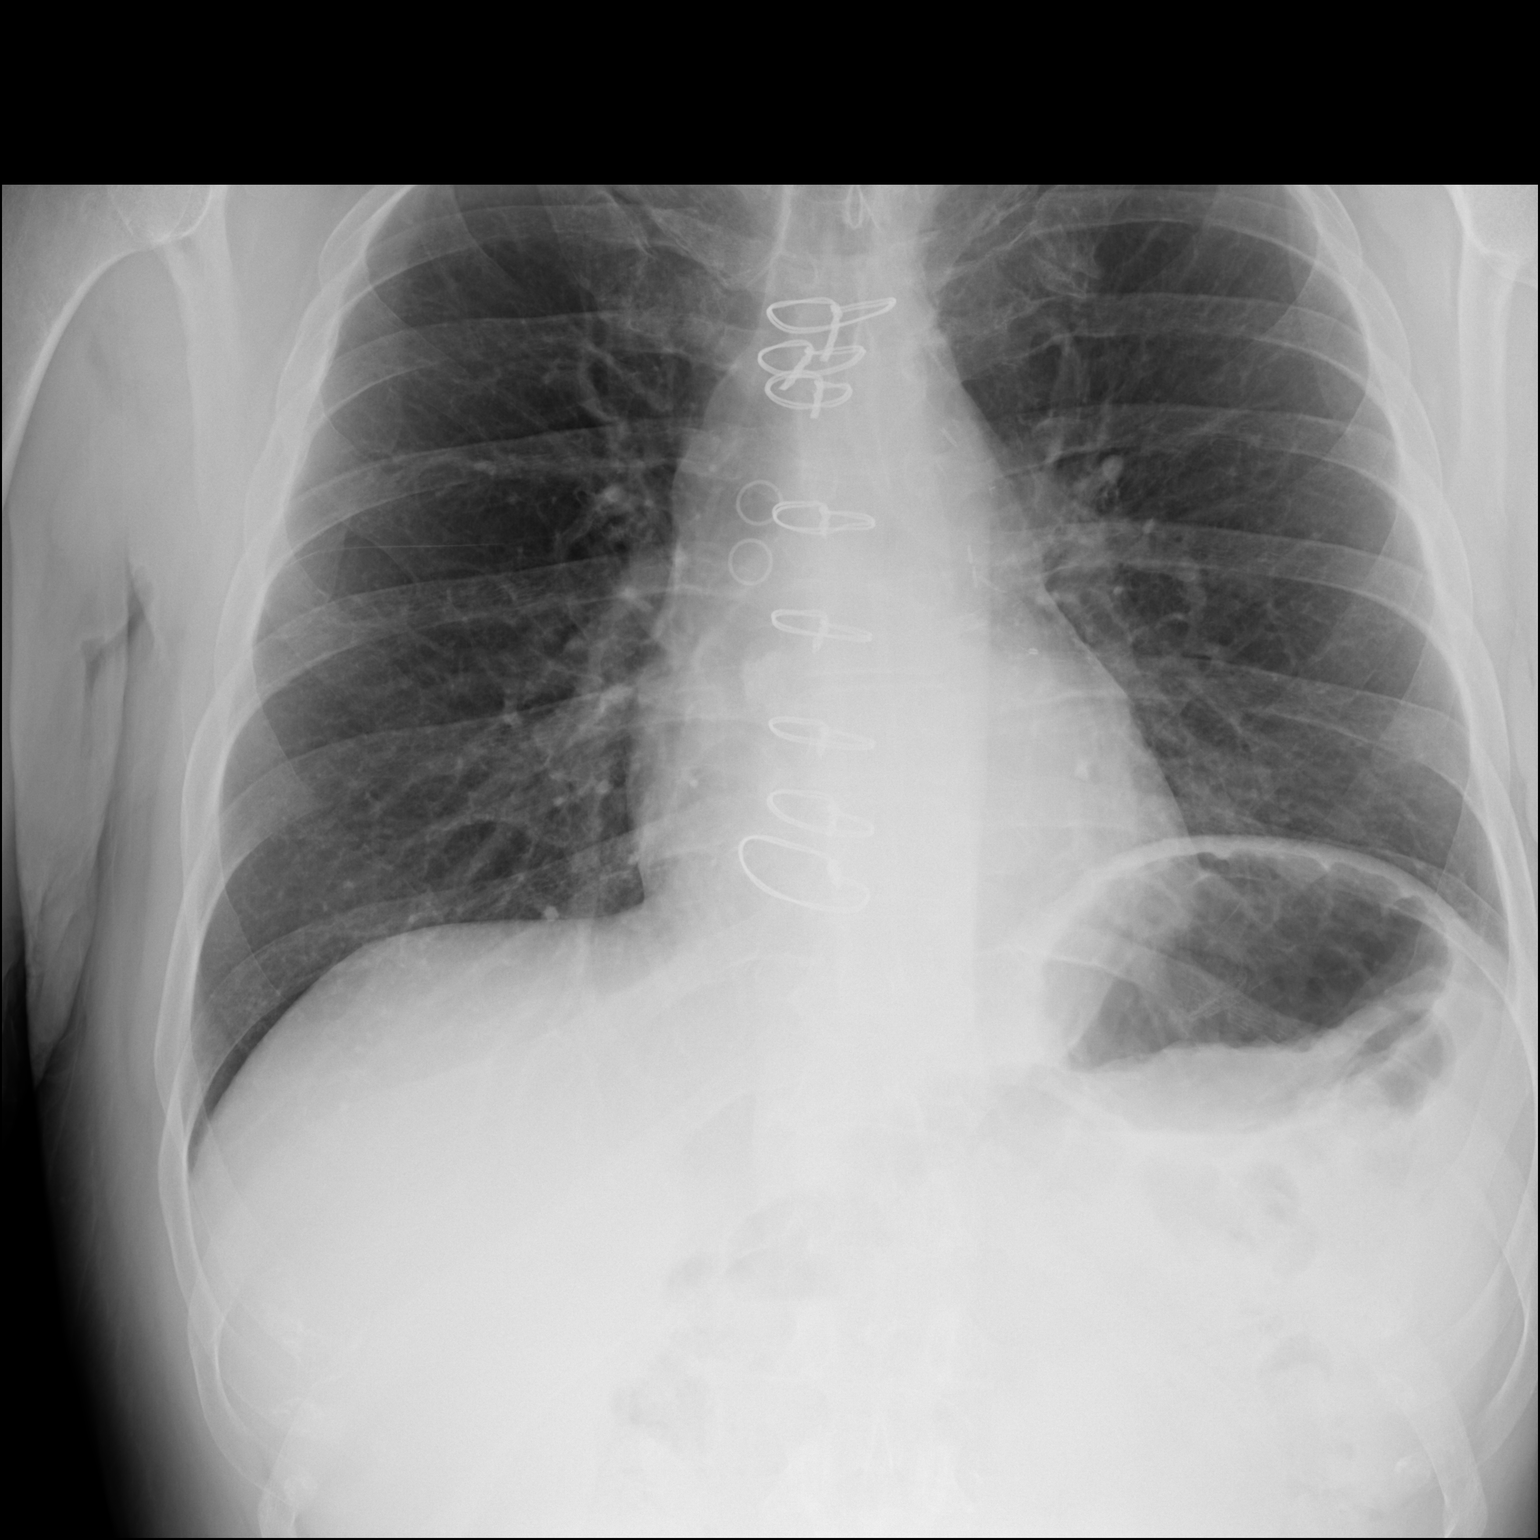

[dg chest 2 view (2 of 2)]
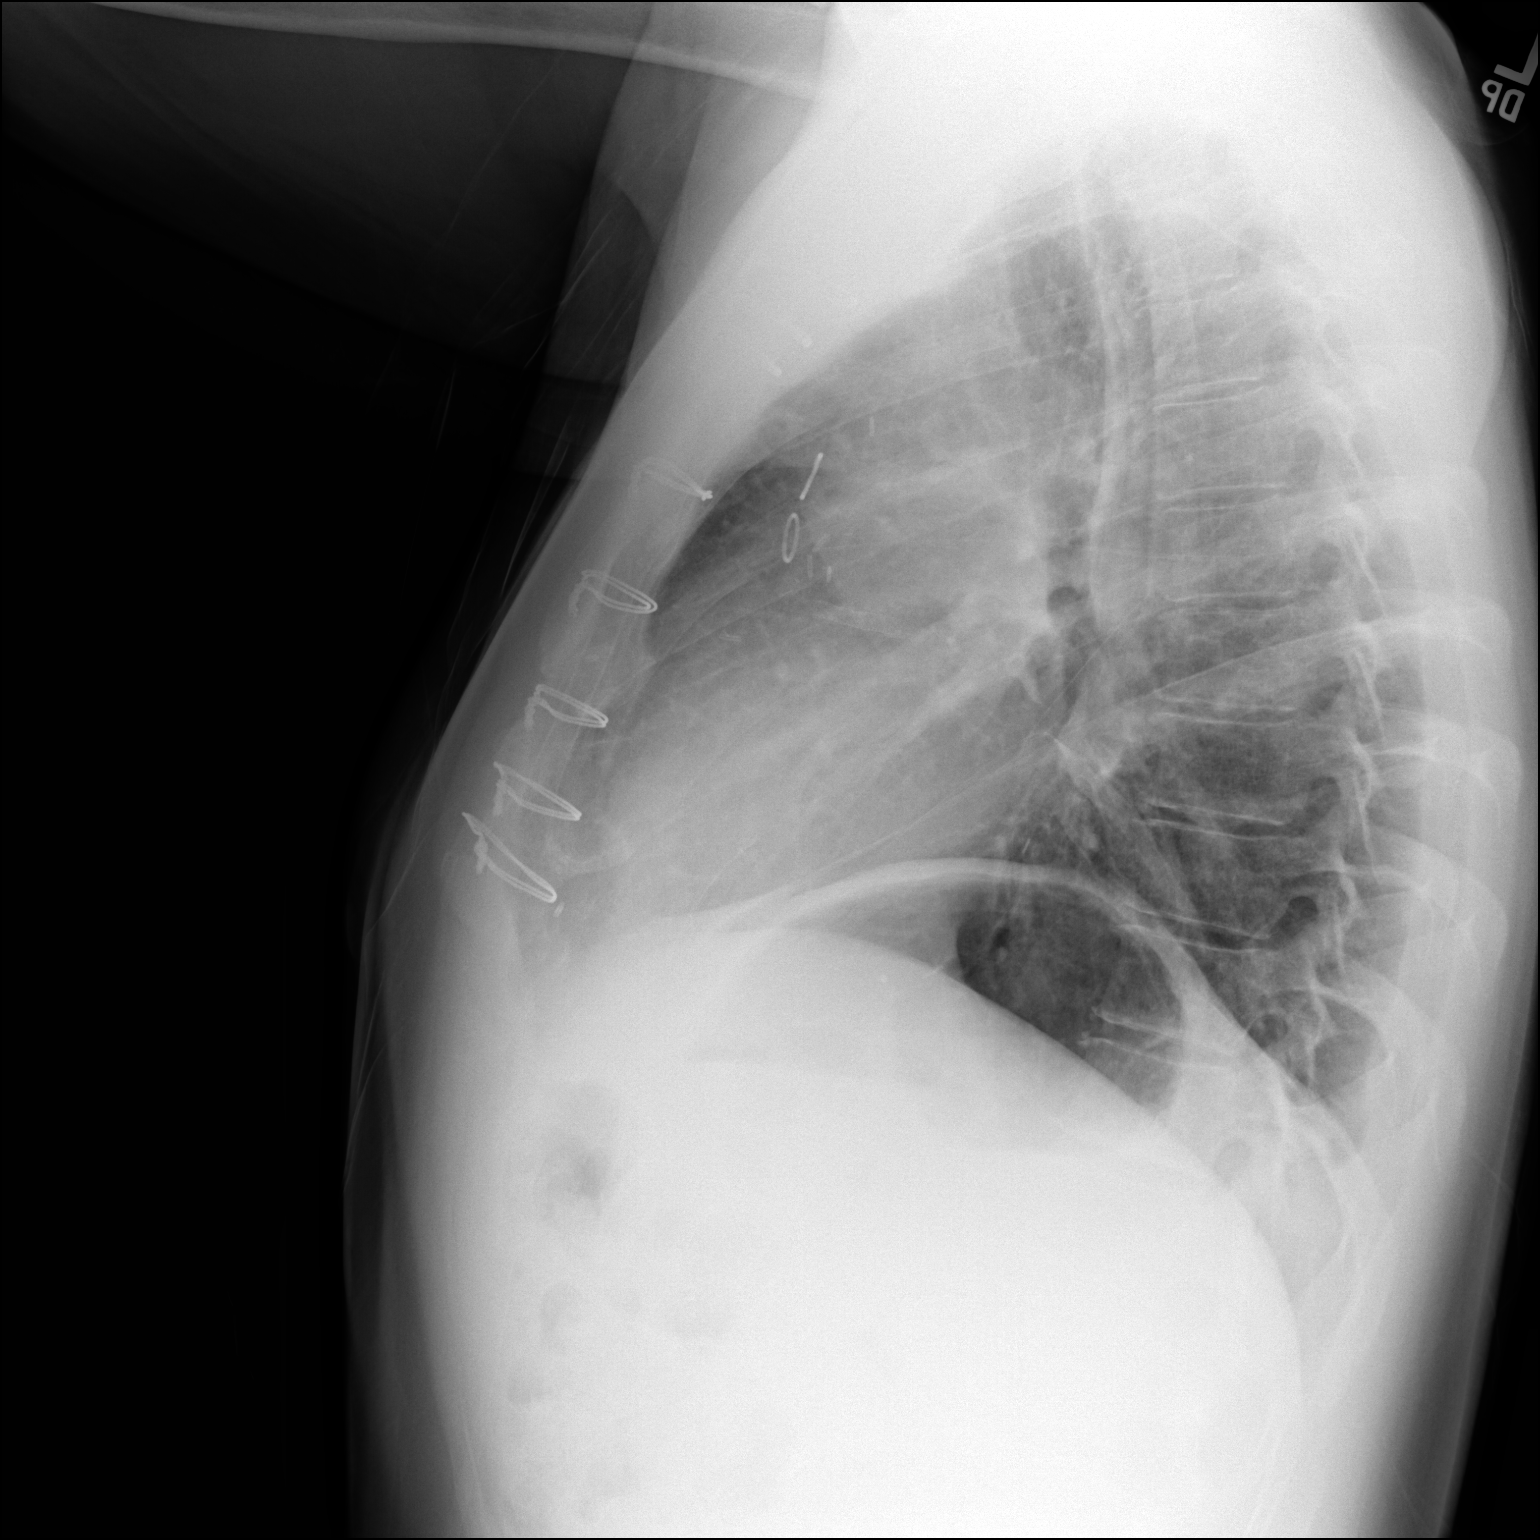

[2 of 2 positions shown; findings below may reference images not displayed]

FINDINGS: Sequelae of CABG are again identified. The cardiomediastinal
silhouette is unchanged with normal heart size. A right jugular
sheath has been removed. Pleural effusions on the prior study have
resolved. Left basilar airspace opacity on the prior study has
resolved, and the lungs are currently clear. No pneumothorax or
acute osseous abnormality is seen.
IMPRESSION: Interval resolution of pleural effusions with improved lung
aeration. No evidence of active cardiopulmonary disease.

## 2021-07-07 NOTE — Patient Instructions (Addendum)
You may return to driving an automobile as long as you are no longer requiring oral narcotic pain relievers during the daytime.  It would be wise to start driving only short distances during the daylight and gradually increase from there as you feel comfortable.   You may continue to gradually increase your physical activity as tolerated.  Refrain from any heavy lifting or strenuous use of your arms and shoulders until at least 8 weeks from the time of your surgery, and avoid activities that cause increased pain in your chest on the side of your surgical incision.  Otherwise you may continue to increase activities without any particular limitations.  Increase the intensity and duration of physical activity gradually.   You are encouraged to enroll and participate in the outpatient cardiac rehab program beginning as soon as practical. Make every effort to keep your diabetes under very tight control.  Follow up closely with your primary care physician or endocrinologist and strive to keep their hemoglobin A1c levels as low as possible, preferably near or below 6.0.  The long term benefits of strict control of diabetes are far reaching and critically important for your overall health and survival.  

## 2021-07-07 NOTE — Progress Notes (Signed)
cxr 

## 2021-07-07 NOTE — Telephone Encounter (Signed)
CSW called to set up ride home through [employer]- ride requested  Burna Sis, LCSW Clinical Social Worker Advanced Heart Failure Clinic Desk#: [phone] Cell#: [phone]

## 2021-07-07 NOTE — Progress Notes (Addendum)
[address]       [place] [address]             [phone]       HPI: This is a 50 year old male with a past medical history of hypertension, Hyperlipidemia, diabetes mellitus, CKD (stage IIIb), HFrEF, ICM,  5th ray amputation (for osteomyelitis) who underwent coronary artery bypass grafting surgery x 3 on 05/22/2021 by Dr. Kipp Brood. Post op course was significant for VT (while in the OR) and Amiodarone resolved this. He also had anemia which required a transfusion. Advanced heart failure assisted in his care post op. He was discharged in stable condition on 05/29/2021. He presents today for routine follow up. Patient states he felt a little dizzy earlier going up steps. He denies shortness of breath or chest pain. He is ambulating a little as he states Dr. Sharol Given told him to limit activity as left 5th ray amputation still healing.  Current Outpatient Medications  Medication Sig Dispense Refill   amiodarone (PACERONE) 200 MG tablet Take 1 tablet (200 mg total) by mouth daily. 30 tablet 3   aspirin EC 325 MG EC tablet Take 1 tablet (325 mg total) by mouth daily. 30 tablet 0   atorvastatin (LIPITOR) 80 MG tablet Take 1 tablet (80 mg total) by mouth daily. 30 tablet 3   digoxin (LANOXIN) 0.125 MG tablet Take 1 tablet (0.125 mg total) by mouth daily. 30 tablet 3   ferrous sulfate 325 (65 FE) MG tablet Take 1 tablet (325 mg total) by mouth daily with supper. For one month then stop. If develops constipation, may take stool softener or stop ferrous before one month (Patient not taking: Reported on 07/04/2021)  3   glucose blood (TRUE METRIX BLOOD GLUCOSE TEST) test strip Use as instructed twice daily 100 each 12   glyBURIDE (DIABETA) 5 MG tablet Take 1 tablet (5 mg total) by mouth 2 (two) times daily. 60 tablet 1   metFORMIN (GLUCOPHAGE) 500 MG tablet Take 1 tablet (500 mg total) by mouth 2 (two) times daily. 60 tablet 1   metoprolol succinate (TOPROL-XL) 25 MG 24 hr  tablet Take 1/2 tablet (12.5 mg total) by mouth at bedtime. 30 tablet 3   spironolactone (ALDACTONE) 25 MG tablet Take 1 tablet (25 mg total) by mouth daily. 30 tablet 3   torsemide (DEMADEX) 20 MG tablet Take 2 tablets (40 mg total) by mouth daily. 60 tablet 3   traMADol (ULTRAM) 50 MG tablet Take 1 tablet (50 mg total) by mouth every 6 (six) hours as needed for moderate pain. 28 tablet 0   TRUEplus Lancets 28G MISC Use as directed twice daily at 8 am and 10 pm. 100 each 11  Vital Signs: Vitals:   07/07/21 1516 07/07/21 1520  BP: (!) 78/48 (!) 77/52  Pulse: (!) 110   Resp: 20   SpO2: 96%     Physical Exam: CV -RRR Pulmonary-Clear to auscultation bilaterally Extremities-Mild RLE edema. LLE in boot Wounds-Clean and dry  Diagnostic Tests:   CLINICAL DATA:  Status post CABG.  Shortness of breath.   EXAM: CHEST - 2 VIEW   COMPARISON:  Chest radiographs 05/29/2021   FINDINGS: Sequelae of CABG are again identified. The cardiomediastinal silhouette is unchanged with normal heart size. A right jugular sheath has been removed. Pleural effusions on the prior study have resolved. Left basilar airspace opacity on the prior study has resolved, and the lungs are currently clear. No pneumothorax  or acute osseous abnormality is seen.   IMPRESSION: Interval resolution of pleural effusions with improved lung aeration. No evidence of active cardiopulmonary disease.     Electronically Signed   By: Sebastian Ache M.D.   On: 07/07/2021 15:15    Impression and Plan: He was spoken to by Dr. Cliffton Asters on 06/06/2021. Patient was not having any problem at that time. He was then seen by Dr. Shirlee Latch on 07/04/2021 and  Sherryll Burger was stopped secondary to low BP.  His BP is again low. I asked him to contact heart failure clinic to see if another medication should be stopped. Of note,  he has another appointment to see advanced heart failure on 12/16. I did ask if he stopped smoking and he said  yes;however, the other day his daughter made him stressed so he had a few puffs. We discussed the importance of total smoking cessation. Also, he needs to obtain a PCP for further management of his diabetes And surveillance of HGA1C 6.5. Once he is no longer taking pain medication and ok with Dr. Lajoyce Corners,  he was instructed it is ok to begin driving short distances (I.e. no more than  30 minutes during the day) for the next several days and he may increase  the frequency and duration as tolerates. Also, sternal precautions (no more than lifting 10 pounds) are to continue for a minimum of 8 weeks. Participation in cardiac rehab was highly encouraged. Our office will refer him to [facility], if he is ok to participate per Dr. Lajoyce Corners. He will return to see Dr. Cliffton Asters PRN  Jerline Pain Margaretann Loveless, PA-C [facility] [phone]

## 2021-07-09 ENCOUNTER — Other Ambulatory Visit (HOSPITAL_COMMUNITY): Payer: Self-pay

## 2021-07-09 ENCOUNTER — Telehealth (HOSPITAL_COMMUNITY): Payer: Self-pay | Admitting: *Deleted

## 2021-07-09 MED ORDER — AMIODARONE HCL 200 MG PO TABS: 200.0000 mg | ORAL_TABLET | Freq: Every day | ORAL | 6 refills | Status: DC

## 2021-07-09 MED ORDER — METOPROLOL SUCCINATE ER 25 MG PO TB24
12.5000 mg | ORAL_TABLET | Freq: Every day | ORAL | 1 refills | Status: DC
  Filled 2021-07-09: qty 15, 30d supply, fill #0

## 2021-07-09 MED ORDER — ATORVASTATIN CALCIUM 80 MG PO TABS: 80.0000 mg | ORAL_TABLET | Freq: Every day | ORAL | 3 refills | Status: DC

## 2021-07-09 MED ORDER — SPIRONOLACTONE 25 MG PO TABS: 12.5000 mg | ORAL_TABLET | Freq: Every day | ORAL | 3 refills | Status: DC

## 2021-07-09 MED ORDER — DIGOXIN 125 MCG PO TABS
0.1250 mg | ORAL_TABLET | Freq: Every day | ORAL | 1 refills | Status: DC
  Filled 2021-07-09: qty 30, 30d supply, fill #0

## 2021-07-09 MED ORDER — TORSEMIDE 20 MG PO TABS
40.0000 mg | ORAL_TABLET | Freq: Every day | ORAL | 1 refills | Status: DC
  Filled 2021-07-09: qty 60, 30d supply, fill #0

## 2021-07-09 NOTE — Telephone Encounter (Signed)
Called and spoke w/pt regarding recommendations and lab results.  Pt aware to decrease Cleda Daub, he states he is unsure about digoxin. He states he has never received what we sent in for him last week and the pharmacy, [facility] told him they can not fill rx's but he doesn't know why.  I called and spoke w/[facility], they state pt has not gotten prescriptions filled through them since Oct, she does see that we sent in rx's for Torsemide, digoxin, and metoprolol succ on 12/9 however they do not carry these medications so they were cancelled. Advised I would send in rx's that pt needed filled there  Called and spoke w/pt again, he has no insurance and [facility] mail them to his home for free. Offered to use [facility] but he states transportation is a real issue for him so it's hard to get to the pharmacy. Pt is sch to see [place] on Fri 12/16, advised I would send in new rx for Amio, atorvastatin, and spiro to [facility] and will get torsemide, dig, and metoprolol succ filled at [facility] and have them here for him at his appt. We will make decisions about going forward at that time and will CSW meet with him as well. He tells me he has spironolactone and metoprolol now but is not sure what else he has.

## 2021-07-09 NOTE — Telephone Encounter (Signed)
Pt left vm stating he had an appointment with another office Monday and his bp was 77/52. He was told to ask if there were any medications he should decrease or stop taking.   Routed to Dr.McLean for advice

## 2021-07-09 NOTE — Telephone Encounter (Signed)
-----   Message from Laurey Morale, MD sent at 07/07/2021 12:11 AM EST ----- Make sure he is taking digoxin.

## 2021-07-09 NOTE — Telephone Encounter (Signed)
Decrease spironolactone to 12.5 mg and take qhs.

## 2021-07-10 ENCOUNTER — Telehealth (HOSPITAL_COMMUNITY): Payer: Self-pay

## 2021-07-10 NOTE — Progress Notes (Signed)
Advanced Heart Failure Clinic Note    PCP: Pcp, No PCP-Cardiologist: Norman Herrlich, MD  HF Cardiologist: Dr. Shirlee Latch  HPI: [name] is a 50 y.o.with a history CAD, Had PCI 2007 in [place]. 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   Admitted to [facility] in February 2022 with lower extremity edema and foot ulcer. BNP and HS Trop elevated and felt to be non-ACS.  Cardiology consulted and he was placed on lasix.    He had follow up with Dr Dulce Sellar 09/09/20 and was set up for cath. Cath with multivessel CAD. Saw Dr Cliffton Asters in the office with plans for CMRI. He was referred to SW due to financial concerns and no payor source. CMRI showed EF 40% and viable myocardium.     Saw Dr Cliffton Asters in July 2022 and was set up for CABG in August. Pre CABG labs completed and showed WBC 25. He was advised to go to the ED.    Admitted 02/25/2021 with left foot infection complicated by osteomyelitis and necrotizing fasciitis. 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 Hospital course complicated by erosive esophagitis on EGD. Recommendations for protonix 40 mg bid + repeat EGD in 8 weeks. Received multiple transfusions. Discharged 03/15/21.    Admitted 03/23/21 with A/C CHF exacerbation. Started on IV lasix. Cardiology consulted. CO-OX 55%. Given 80 mg IV x1. V lasix increased to 100 mg tid and started on metolazone.  Repeat echo on 03/23/21 with LVEF of 25%, LV demonstrating global hypokinesis, and LV diastolic parameters consistent with grade II diastolic dysfunction. RHC performed 03/26/21 showed preserved cardiac output, mildly elevated PCWP, and mild pulmonary venous hypertension. LHC performed on 03/28/21 showed  minimal change from prior study in 2/22. Ost LAD to Mid LAD 90% stenosed, mid LAD to Dist LAD 90% stenosed, dist LAD lesion 100% stenosed.prox Cx  70% stenosed, prox RCA to Mid RCA 100% stenosed, dist RCA 100% stenosed, ramus 70% stenosed, 1st Mrg 50% stenosed, mid LM to Dist LM 60% stenosed. Hospitalization c/b AKI on CKD 3.   After full treatment of foot infection, he was admitted in 10/22 for CABG with LIMA-LAD, SVG-OM1, SVG-OM3.  PDA too small to graft.   Today he returns for HF follow up. BP was low last visit and Entresto was stopped. Does not want to take spiro anymore because he read it blocks testosterone. He has been off all meds for a week. No SOB with walking on flat ground. Overall feeling fine. Denies CP, dizziness, edema, or PND/Orthopnea. Appetite ok. No fever or chills. Does not weigh at home. He lives with his daughter, uses Public librarian. Continues to wear a boot and is followed by Dr. Lajoyce Corners.   ReDs: 24%  Labs (11/22): K 4.2, creatinine 1.38 Labs (12/22): K 4.4, creatinine 1.94  ECG (personally reviewed): none ordered today.  PMH: 1. GI bleeding: Erosive esophagitis 8/22.  2. CKD stage 3 3. Type 2 diabetes 4. Hyperlipidemia 5. Left foot/leg diabetic infection with necrotizing fasciitis: 8/22, debridements were done and the left 5th metatarsal was amputated.   6. Prior smoker 7. H/o PVC, VT: On amiodarone.  8. CAD: LHC in 9/22 with severe 3 vessel disease.  - CABG (10/22) with LIMA-LAD, SVG-OM2, SVG-OM3.  9. Chronic systolic CHF: Ischemic cardiomyopathy.  - Echo (8/22): LVEF of 25%, LV demonstrating global hypokinesis, and LV diastolic parameters consistent with grade II diastolic dysfunction.   Review of Systems: All  systems reviewed and negative except as per HPI.    Current Outpatient Medications  Medication Sig Dispense Refill   amiodarone (PACERONE) 200 MG tablet Take 1 tablet (200 mg total) by mouth daily. 30 tablet 6   aspirin EC 325 MG EC tablet Take 1 tablet (325 mg total) by mouth daily. 30 tablet 0   atorvastatin (LIPITOR) 80 MG tablet Take 1 tablet (80 mg total) by mouth daily. 30 tablet 3    digoxin (LANOXIN) 0.125 MG tablet Take 1 tablet (0.125 mg total) by mouth daily. 30 tablet 1   ferrous sulfate 325 (65 FE) MG tablet Take 1 tablet (325 mg total) by mouth daily with supper. For one month then stop. If develops constipation, may take stool softener or stop ferrous before one month  3   glucose blood (TRUE METRIX BLOOD GLUCOSE TEST) test strip Use as instructed twice daily 100 each 12   glyBURIDE (DIABETA) 5 MG tablet Take 1 tablet (5 mg total) by mouth 2 (two) times daily. 60 tablet 1   metFORMIN (GLUCOPHAGE) 500 MG tablet Take 1 tablet (500 mg total) by mouth 2 (two) times daily. 60 tablet 1   metoprolol succinate (TOPROL-XL) 25 MG 24 hr tablet Take 1/2 tablet (12.5 mg total) by mouth at bedtime. 15 tablet 1   torsemide (DEMADEX) 20 MG tablet Take 2 tablets (40 mg total) by mouth daily. 60 tablet 1   traMADol (ULTRAM) 50 MG tablet Take 1 tablet (50 mg total) by mouth every 6 (six) hours as needed for moderate pain. 28 tablet 0   TRUEplus Lancets 28G MISC Use as directed twice daily at 8 am and 10 pm. 100 each 11   spironolactone (ALDACTONE) 25 MG tablet Take 0.5 tablets (12.5 mg total) by mouth at bedtime. 15 tablet 3   No current facility-administered medications for this encounter.   Allergies  Allergen Reactions   Morphine Itching   Social History   Socioeconomic History   Marital status: Single    Spouse name: Not on file   Number of children: 1   Years of education: Not on file   Highest education level: 8th grade  Occupational History   Occupation: none    Comment: former "[employer]"  Tobacco Use   Smoking status: Former    Packs/day: 3.00    Years: 36.00    Pack years: 108.00    Types: Cigarettes    Quit date: 08/27/2020    Years since quitting: 0.8   Smokeless tobacco: Never   Tobacco comments:    5-6 daily  Vaping Use   Vaping Use: Never used  Substance and Sexual Activity   Alcohol use: Not Currently    Comment: Very rare   Drug use: Not Currently     Types: Marijuana   Sexual activity: Not on file  Other Topics Concern   Not on file  Social History Narrative   Not on file   Social Determinants of Health   Financial Resource Strain: High Risk   Difficulty of Paying Living Expenses: Very hard  Food Insecurity: No Food Insecurity   Worried About Charity fundraiser in the Last Year: Never true   Ran Out of Food in the Last Year: Never true  Transportation Needs: Unmet Transportation Needs   Lack of Transportation (Medical): Yes   Lack of Transportation (Non-Medical): Yes  Physical Activity: Not on file  Stress: Not on file  Social Connections: Not on file  Intimate Partner Violence: Not on file  Family History  Problem Relation Age of Onset   Hypertension Mother    Heart failure Father    Hypertension Brother    Congestive Heart Failure Brother    BP 110/70    Pulse 96    Wt 102.9 kg (226 lb 12.8 oz)    SpO2 98%    BMI 28.35 kg/m   Wt Readings from Last 3 Encounters:  07/11/21 102.9 kg (226 lb 12.8 oz)  07/07/21 101.2 kg (223 lb)  05/29/21 106.9 kg (235 lb 10.8 oz)   PHYSICAL EXAM: General:  NAD. No resp difficulty HEENT: Normal Neck: Supple. No JVD. Carotids 2+ bilat; no bruits. No lymphadenopathy or thryomegaly appreciated. Cor: PMI nondisplaced. Regular rate & rhythm. No rubs, gallops or murmurs. Lungs: Clear Abdomen: Soft, nontender, nondistended. No hepatosplenomegaly. No bruits or masses. Good bowel sounds. Extremities: No cyanosis, clubbing, rash, trace BLE edema, ortho boot left leg Neuro: Alert & oriented x 3, cranial nerves grossly intact. Moves all 4 extremities w/o difficulty. Affect pleasant.  ASSESSMENT & PLAN: 1. CAD: LHC in 2/22 with 50% dLM, 70% ramus, 70% pLCx, totally occluded proximal RCA, 90% stenosis ostial to mid LAD, 90% mid LAD, totally occluded distal LAD.  Cardiac MRI in 4/22 showed EF 40% with substantial viable myocardium, only delayed enhancement was subendocardial in the mid to apical  inferior wall.  With this information, CABG had been planned in 8/22, but patient was admitted prior with left foot diabetic foot infection that progressed to necrotizing fasciitis.  Patient is now s/p extensive left lower leg debridement.  Repeat coronary angiography (9/22) showed minimal change from prior study in 2/22.  CABG x 3 with LIMA-LAD, SVG-OM2, and SVG-OM3 was finally done in 10/22. No chest pain.  - Restart ASA 81 daily.  - Restart atorvastatin 80 mg daily. Check lipids next appt.  2. Chronic systolic CHF: Ischemic cardiomyopathy.  Initial echo in 2/22 with EF 40-45%.  Echo in 7/22 with EF 30-35%.  Echo in 8/22 with EF down to 25% with mild RV dysfunction and dilated IVC.  [place] 8/22 with preserved CO, mildly elevated PCWP, mild pulmonary hypertension.  NYHA class II symptoms, more limited by left leg.  BP is better today off Entresto. He is not volume overloaded on exam, ReDs 24%, I am concerned he may be slightly hypovolemic. - Restart Toprol XL 12.5 mg q hs. - Restart digoxin 0.125 mg daily. Dig level in 1 week. - Stop spiro. Plan to add back next, but will switch to eplerenone.  - Remain off torsemide until next visit. Plan to add this back as well. He needs to weigh daily and notify clinic if he starts swelling or becomes SOB. - Remain off Entresto. - Have avoided SGLT2i with history of necrotizing fasciitis, but think he may be able to start in future.  - Echo in 2/23 to decide on ICD.  3. CKD: Stage 3.  BMET today. 4. GI bleeding: History of erosive esophagitis earlier in 8/22.  He is now on Protonix.   - CBC today. 5. 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  - Follows with Dr. Sharol Given for wound care. - Needs PT at home to help mobilize him.  6. DM2: Needs a PCP/endocrinologist.  7. SDOH: Will ask HFSW to help with medications. Should be able to get all heart medications through [facility]  outpatient pharmacy delivered to his home.  - May benefit  from paramedicine.  Follow up with PharmD in 3 weeks for med titration (add eplerenone 12.5 if BP allows and torsemide 20 mg daily, will need BMET), and APP in 6 weeks.  Keep scheduled appointment for echo 08/2021.   [place], FNP 07/11/21

## 2021-07-10 NOTE — Telephone Encounter (Signed)
Called to confirm/remind patient of their appointment at the [facility] Clinic on 07/11/21.  ° °Patient reminded to bring all medications and/or complete list. ° °Confirmed patient has transportation. Gave directions, instructed to utilize valet parking. ° °Confirmed appointment prior to ending call.  ° ° °

## 2021-07-11 ENCOUNTER — Ambulatory Visit (HOSPITAL_COMMUNITY)
Admission: RE | Admit: 2021-07-11 | Discharge: 2021-07-11 | Disposition: A | Discharge status: Home / Self Care | Payer: Self-pay | Source: Ambulatory Visit | Attending: Family Medicine | Admitting: Family Medicine

## 2021-07-11 ENCOUNTER — Other Ambulatory Visit: Payer: Self-pay

## 2021-07-11 ENCOUNTER — Encounter (HOSPITAL_COMMUNITY): Payer: Self-pay

## 2021-07-11 VITALS — BP 110/70 | HR 96 | Wt 226.8 lb

## 2021-07-11 DIAGNOSIS — Z139 Encounter for screening, unspecified: Secondary | ICD-10-CM

## 2021-07-11 DIAGNOSIS — Z79899 Other long term (current) drug therapy: Secondary | ICD-10-CM | POA: Insufficient documentation

## 2021-07-11 DIAGNOSIS — I5042 Chronic combined systolic (congestive) and diastolic (congestive) heart failure: Secondary | ICD-10-CM | POA: Insufficient documentation

## 2021-07-11 DIAGNOSIS — I2510 Atherosclerotic heart disease of native coronary artery without angina pectoris: Secondary | ICD-10-CM | POA: Insufficient documentation

## 2021-07-11 DIAGNOSIS — E11621 Type 2 diabetes mellitus with foot ulcer: Secondary | ICD-10-CM | POA: Insufficient documentation

## 2021-07-11 DIAGNOSIS — I130 Hypertensive heart and chronic kidney disease with heart failure and stage 1 through stage 4 chronic kidney disease, or unspecified chronic kidney disease: Secondary | ICD-10-CM | POA: Insufficient documentation

## 2021-07-11 DIAGNOSIS — Z87891 Personal history of nicotine dependence: Secondary | ICD-10-CM | POA: Insufficient documentation

## 2021-07-11 DIAGNOSIS — E1169 Type 2 diabetes mellitus with other specified complication: Secondary | ICD-10-CM | POA: Insufficient documentation

## 2021-07-11 DIAGNOSIS — Z09 Encounter for follow-up examination after completed treatment for conditions other than malignant neoplasm: Secondary | ICD-10-CM | POA: Insufficient documentation

## 2021-07-11 DIAGNOSIS — Z951 Presence of aortocoronary bypass graft: Secondary | ICD-10-CM | POA: Insufficient documentation

## 2021-07-11 DIAGNOSIS — E11628 Type 2 diabetes mellitus with other skin complications: Secondary | ICD-10-CM

## 2021-07-11 DIAGNOSIS — L089 Local infection of the skin and subcutaneous tissue, unspecified: Secondary | ICD-10-CM | POA: Insufficient documentation

## 2021-07-11 DIAGNOSIS — Z8719 Personal history of other diseases of the digestive system: Secondary | ICD-10-CM

## 2021-07-11 DIAGNOSIS — Z7982 Long term (current) use of aspirin: Secondary | ICD-10-CM | POA: Insufficient documentation

## 2021-07-11 DIAGNOSIS — E1122 Type 2 diabetes mellitus with diabetic chronic kidney disease: Secondary | ICD-10-CM | POA: Insufficient documentation

## 2021-07-11 DIAGNOSIS — N183 Chronic kidney disease, stage 3 unspecified: Secondary | ICD-10-CM | POA: Insufficient documentation

## 2021-07-11 DIAGNOSIS — E118 Type 2 diabetes mellitus with unspecified complications: Secondary | ICD-10-CM

## 2021-07-11 DIAGNOSIS — I25118 Atherosclerotic heart disease of native coronary artery with other forms of angina pectoris: Secondary | ICD-10-CM

## 2021-07-11 DIAGNOSIS — Z9889 Other specified postprocedural states: Secondary | ICD-10-CM | POA: Insufficient documentation

## 2021-07-11 LAB — BASIC METABOLIC PANEL
Anion gap: 4 — ABNORMAL LOW (ref 5–15)
BUN: 26 mg/dL — ABNORMAL HIGH (ref 6–20)
CO2: 22 mmol/L (ref 22–32)
Calcium: 8.5 mg/dL — ABNORMAL LOW (ref 8.9–10.3)
Chloride: 107 mmol/L (ref 98–111)
Creatinine, Ser: 1.5 mg/dL — ABNORMAL HIGH (ref 0.61–1.24)
GFR, Estimated: 57 mL/min — ABNORMAL LOW (ref >60)
Glucose, Bld: 202 mg/dL — ABNORMAL HIGH (ref 70–99)
Potassium: 4.4 mmol/L (ref 3.5–5.1)
Sodium: 133 mmol/L — ABNORMAL LOW (ref 135–145)

## 2021-07-11 LAB — BRAIN NATRIURETIC PEPTIDE: B Natriuretic Peptide: 123.8 pg/mL — ABNORMAL HIGH (ref 0.0–100.0)

## 2021-07-11 MED ORDER — AMIODARONE HCL 200 MG PO TABS: 200.0000 mg | ORAL_TABLET | Freq: Every day | ORAL | 3 refills | Status: DC

## 2021-07-11 MED ORDER — ATORVASTATIN CALCIUM 80 MG PO TABS: 80.0000 mg | ORAL_TABLET | Freq: Every day | ORAL | 3 refills | Status: DC

## 2021-07-11 NOTE — Progress Notes (Signed)
Heart and Vascular Care Navigation  07/11/2021  Adam James [birth year] [id number]  Reason for Referral: concerns with access to medication/paramedicine assessment/lack of insurance   Engaged with patient face to face for follow up visit for Heart and Vascular Care Coordination.                                                                                                   Assessment:   CSW met with pt to assess for current social concerns and potential paramedicine referral.  Pt reports he lives with his Adam James daughter who supports him at this time.  Is applying for disability (submitted in March) and is awaiting determination- had phone call with them last week to update on medical appts so continues to be in contact with them.   Has not applied for Medicaid- was told by [facility] that he can't apply until Disability case has been determined on.  Pt reports he has worked since he was 50yo so will likely get SSDI so will need to meet income requirements for Medicaid in order to qualify.  Pt has apparently been without medications. Has some medications being provided through [place]- states another MD office helped him to order these- CSW provided with [place] [facility] number to call them when he runs low on these medications.  Pt gets other medications through HF fund which he was provided today- reports he will now have all his medications.  Takes them out of the bottles and states he feels confident he is taking appropriately.  CSW discussed community paramedicine program but pt is not interested in having someone come out to assist him.  CSW provided pt with CAFA to help with [place] bills- pt will plan to fill out and return.                                HRT/VAS Care Coordination     Patients Home Cardiology Office [facility]   Outpatient Care Team Social Worker   Social Worker Name: Adam Hummer, LCSW, [facility]   Living arrangements for the past 2 months  Apartment   Lives with: Adult Children   Patient Current Insurance Coverage Self-Pay   Patient Has Concern With Paying Medical Bills Yes   Patient Concerns With Medical Bills ongoing medical care, past unpaid expenses   Medical Bill Referrals: CAFA   Does Patient Have Prescription Coverage? No   Patient Prescription Assistance Programs [place] [facility]   [place] [facility] Medications pt application submitted, if approved will send medications   Other Assistance Programs Medications assistance financially provided at [employer] on 6/28   [place] (specify type)   DME Agency [employer]   [place] ([place])   Current home services DME  wheelchair, rolling walker, cane       Social History:  SDOH Screenings   Alcohol Screen: Low Risk    Last Alcohol Screening Score (AUDIT): 0  Depression (PHQ2-9): Low Risk    PHQ-2 Score: 1  Financial Resource Strain: High Risk   Difficulty of Paying Living Expenses: Hard  Food Insecurity: No Food Insecurity   Worried About Charity fundraiser in the Last Year: Never true   Ran Out of Food in the Last Year: Never true  Housing: Low Risk    Last Housing Risk Score: 0  Physical Activity: Not on file  Social Connections: Not on file  Stress: Not on file  Tobacco Use: Medium Risk   Smoking Tobacco Use: Former   Smokeless Tobacco Use: Never   Passive Exposure: Not on Pensions consultant Needs: Unmet Transportation Needs   Lack of Transportation (Medical): Yes   Lack of Transportation (Non-Medical): Yes    SDOH Interventions: Financial Resources:  Financial Strain Interventions: Other (Comment) (on [place] [facility], provided CAFA, provided medications through HF fund) Social Security for Licensed conveyancer Insecurity:  Food Insecurity Interventions: Other (Comment) (receiving food stamps- about $350/month states  this covers expenses)  Housing Insecurity:  Housing Interventions: Intervention Not Indicated  Transportation:       Follow-up plan:    Pt to work on [place] to help with [place] bills  Adam James, [place] Worker [facility] Desk#: [phone] Cell#: [phone]

## 2021-07-11 NOTE — Patient Instructions (Addendum)
RESTART Digoxin 0.125mg , one tab daily RESTART Atorvastatin 80 mg one daily RESTART Toprol XL one half tab nightly at bedtime RESTART Amiodarone 200 mg one tab daily RESTART Aspirin 81 mg, one tab daily STOP Spironolactone   Labs today We will only contact you if something comes back abnormal or we need to make some changes. Otherwise no news is good news!  Labs needed in 7-10 days  Your physician recommends that you schedule a follow-up appointment in: 3 weeks with the  pharmacy team and in 6 weeks  in the Advanced Practitioners (PA/NP) Clinic   Keep other cardiology appointments as scheduled  Do the following things EVERYDAY: Weigh yourself in the morning before breakfast. Write it down and keep it in a log. Take your medicines as prescribed Eat low salt foods--Limit salt (sodium) to 2000 mg per day.  Stay as active as you can everyday Limit all fluids for the day to less than 2 liters  At the Advanced Heart Failure Clinic, you and your health needs are our priority. As part of our continuing mission to provide you with exceptional heart care, we have created designated Provider Care Teams. These Care Teams include your primary Cardiologist (physician) and Advanced Practice Providers (APPs- Physician Assistants and Nurse Practitioners) who all work together to provide you with the care you need, when you need it.   You may see any of the following providers on your designated Care Team at your next follow up: Dr Arvilla Meres Dr Carron Curie, NP Robbie Lis, [name] [facility] [place], [name] Karle Plumber, PharmD   Please be sure to bring in all your medications bottles to every appointment.

## 2021-07-11 NOTE — Progress Notes (Signed)
ReDS Vest / Clip - 07/11/21 1200       ReDS Vest / Clip   Station Marker D    Ruler Value 32    ReDS Value Range Low volume    ReDS Actual Value 24

## 2021-07-16 ENCOUNTER — Encounter: Payer: Self-pay | Admitting: Orthopedic Surgery

## 2021-07-16 NOTE — Progress Notes (Signed)
Office Visit Note   Patient: Adam James           Date of Birth: 1970-08-04           MRN: [id number] Visit Date: 07/01/2021              Requested by: No referring provider defined for this encounter. PCP: Pcp, No  Chief Complaint  Patient presents with   Left Foot - Routine Post Op    04/30/2021 left foot 5th ray amputation application of a-cell powder       HPI: Patient is a 50 year old gentleman who is about 2 months status post left foot fifth ray amputation.  Patient is currently full weightbearing in a fracture boot with wound maceration and swelling and blisters on the toes.  Assessment & Plan: Visit Diagnoses:  1. Subacute osteomyelitis, left ankle and foot ([place])   2. 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    Follow-Up Instructions: Return in about 4 weeks (around 07/29/2021).   Ortho Exam  Patient is alert, oriented, no adenopathy, well-dressed, normal affect, normal respiratory effort. Patient has no active plantar flexion or dorsiflexion of the ankle.  He is status post recent CABG procedure the anterior and lateral compartment necrotizing fasciitis is well-healed.  He is developing a plantarflexion varus cavus deformity secondary to over pulling of the posterior tibial tendon.  Imaging: No results found. No images are attached to the encounter.  Labs: Lab Results  Component Value Date   HGBA1C 6.5 (H) 05/20/2021   HGBA1C 6.1 (H) 04/28/2021   HGBA1C 8.8 (H) 02/21/2021   ESRSEDRATE 25 (H) 03/14/2021   ESRSEDRATE >140 (H) 02/25/2021   CRP 1.6 (H) 03/14/2021   REPTSTATUS 05/06/2021 FINAL 04/30/2021   GRAMSTAIN NO WBC SEEN NO ORGANISMS SEEN  04/30/2021   CULT  04/30/2021    RARE PROVIDENCIA STUARTII RARE ALCALIGENES FAECALIS RARE NORMAL SKIN FLORA CRITICAL RESULT CALLED TO, READ BACK BY AND VERIFIED WITH: RN MARY S.  [id number] FCP NO ANAEROBES ISOLATED Performed at [facility], [place] [address], [place], [place] [address]    LABORGA PROVIDENCIA STUARTII 04/30/2021   LABORGA ALCALIGENES FAECALIS 04/30/2021     Lab Results  Component Value Date   ALBUMIN 2.6 (L) 07/04/2021   ALBUMIN 2.6 (L) 05/20/2021   ALBUMIN 2.8 (L) 04/28/2021    Lab Results  Component Value Date   MG 2.4 05/26/2021   MG 2.7 (H) 05/23/2021   MG 2.9 (H) 05/23/2021   No results found for: VD25OH  No results found for: PREALBUMIN CBC EXTENDED Latest Ref Rng & Units 05/29/2021 05/28/2021 05/27/2021  WBC 4.0 - 10.5 K/uL 8.7 7.8 8.1  RBC 4.22 - 5.81 MIL/uL 3.21(L) 3.12(L) 3.00(L)  HGB 13.0 - 17.0 g/dL 9.1(L) 9.0(L) 8.6(L)  HCT 39.0 - 52.0 % 28.2(L) 27.6(L) 26.4(L)  PLT 150 - 400 K/uL 321 330 291  NEUTROABS 1.4 - 7.0 x10E3/uL - - -  LYMPHSABS 0.7 - 3.1 x10E3/uL - - -     There is no height or weight on file to calculate BMI.  Orders:  No orders of the defined types were placed in this encounter.  No orders of the defined types were placed in this encounter.    Procedures: No procedures performed  Clinical Data: No additional findings.  ROS:  All other systems negative,  except as noted in the HPI. Review of Systems  Objective: Vital Signs: There were no vitals taken for this visit.  Specialty Comments:  No specialty comments available.  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   Acute osteomyelitis of left foot (HCC)    Acute exacerbation of CHF (congestive heart failure) ([place]) 04/28/2021   Venous stasis ulcer of right calf limited to breakdown of skin without varicose veins (HCC)    Non-pressure chronic ulcer of other part of left foot limited to breakdown of skin (HCC)    Osteomyelitis (HCC)    Acute on chronic combined systolic and diastolic CHF (congestive heart failure) (HCC)    Acute on chronic heart failure ([place]) 03/23/2021   NSTEMI  (non-ST elevated myocardial infarction) (HCC)    Gastroesophageal reflux disease with esophagitis and hemorrhage    Anemia, posthemorrhagic, acute    Acute esophagitis    Gastrointestinal hemorrhage    Necrotizing fasciitis (HCC)    Left leg cellulitis    Diabetic polyneuropathy associated with type 2 diabetes mellitus (HCC)    Severe protein-calorie malnutrition (HCC)    Gangrene of left foot ([place]) 02/25/2021   Type 2 diabetes mellitus (HCC)    Heart failure (HCC)    Dyslipidemia    Diabetic foot ulcer (HCC)    CAD (coronary artery disease)    Cardiomyopathy ([place])    Coronary artery disease involving native coronary artery of native heart with angina pectoris ([place])    Past Medical History:  Diagnosis Date   Anemia    GI bleed; 03/13/11 EGD: relux esophagitis with ulceration/clot   CAD (coronary artery disease)    Cardiomyopathy (HCC)    CHF (congestive heart failure) (HCC)    CKD (chronic kidney disease)    Diabetic foot ulcer (HCC)    Dyslipidemia    Heart failure (HCC)    Myocardial infarction ([place])    Type 2 diabetes mellitus ([place])     Family History  Problem Relation Age of Onset   Hypertension Mother    Heart failure Father    Hypertension Brother    Congestive Heart Failure Brother     Past Surgical History:  Procedure Laterality Date   AMPUTATION Left 04/30/2021   Procedure: LEFT FOOT 5TH RAY AMPUTATION APPLICATION OF A-CELL POWDER;  Surgeon: Newt Minion, MD;  Location: [place];  Service: Orthopedics;  Laterality: Left;   APPENDECTOMY     APPLICATION OF WOUND VAC Left 03/03/2021   Procedure: APPLICATION OF WOUND VAC;  Surgeon: Leandrew Koyanagi, MD;  Location: [place];  Service: Orthopedics;  Laterality: Left;   APPLICATION OF WOUND VAC  03/07/2021   Procedure: APPLICATION OF WOUND VAC;  Surgeon: Newt Minion, MD;  Location: [place];  Service: Orthopedics;;   CARDIAC CATHETERIZATION     COLON SURGERY     CORONARY ARTERY BYPASS GRAFT N/A 05/22/2021   Procedure:  CORONARY ARTERY BYPASS GRAFTING (CABG) X 3 USING LEFT INTERNAL MAMMARY ARTERY AND RIGHT ENDOSCOPIC GREATER SAPHENOUS VEIN CONDUITS;  Surgeon: Lajuana Matte, MD;  Location: [place];  Service: Open Heart Surgery;  Laterality: N/A;   ENDOVEIN HARVEST OF GREATER SAPHENOUS VEIN Right 05/22/2021   Procedure: ENDOVEIN HARVEST OF GREATER SAPHENOUS VEIN;  Surgeon: Lajuana Matte, MD;  Location: [place];  Service: Open Heart Surgery;  Laterality: Right;   ESOPHAGOGASTRODUODENOSCOPY (EGD) WITH PROPOFOL N/A 03/12/2021   Procedure: ESOPHAGOGASTRODUODENOSCOPY (EGD) WITH PROPOFOL;  Surgeon: Dustin Flock  E, MD;  Location: [facility];  Service: Gastroenterology;  Laterality: N/A;   I & D EXTREMITY Left 02/24/2021   Procedure: IRRIGATION AND DEBRIDEMENT EXTREMITY;  Surgeon: Myrene Galas, MD;  Location: [facility] OR;  Service: Orthopedics;  Laterality: Left;   I & D EXTREMITY Left 02/26/2021   Procedure: IRRIGATION AND DEBRIDEMENT OF LEG;  Surgeon: [name], MD;  Location: [facility] OR;  Service: Orthopedics;  Laterality: Left;   I & D EXTREMITY Left 03/03/2021   Procedure: LEFT LEG IRRIGATION AND DEBRIDEMENT;  Surgeon: Tarry Kos, MD;  Location: [facility] OR;  Service: Orthopedics;  Laterality: Left;   I & D EXTREMITY Left 03/07/2021   Procedure: REPEAT DEBRIDEMENT LEFT LEG;  Surgeon: [name], MD;  Location: [name] OR;  Service: Orthopedics;  Laterality: Left;   IR FLUORO GUIDE CV LINE RIGHT  03/10/2021   IR REMOVAL TUN CV CATH W/O FL  03/24/2021   IR US GUIDE VASC ACCESS RIGHT  03/10/2021   LEFT HEART CATH AND CORONARY ANGIOGRAPHY N/A 09/13/2020   Procedure: LEFT HEART CATH AND CORONARY ANGIOGRAPHY;  Surgeon: Lennette Bihari, MD;  [facility];  Service: Cardiovascular;  Laterality: N/A;   LEFT HEART CATH AND CORONARY ANGIOGRAPHY N/A 03/28/2021   Procedure: LEFT HEART CATH AND CORONARY ANGIOGRAPHY;  Surgeon: Laurey Morale, MD;  Location: [facility] INVASIVE CV LAB;  Service: Cardiovascular;  Laterality:  N/A;   RIGHT HEART CATH N/A 03/26/2021   Procedure: RIGHT HEART CATH;  Surgeon: Laurey Morale, MD;  Location: [facility] INVASIVE CV LAB;  Service: Cardiovascular;  Laterality: N/A;   TEE WITHOUT CARDIOVERSION N/A 05/22/2021   Procedure: TRANSESOPHAGEAL ECHOCARDIOGRAM (TEE);  Surgeon: Corliss Skains, MD;  Location: [facility] OR;  Service: Open Heart Surgery;  Laterality: N/A;   TOE AMPUTATION  2020   Social History   Occupational History   Occupation: none    Comment: former "[employer]"  Tobacco Use   Smoking status: Former    Packs/day: 3.00    Years: 36.00    Pack years: 108.00    Types: Cigarettes    Quit date: 08/27/2020    Years since quitting: 0.8   Smokeless tobacco: Never   Tobacco comments:    5-6 daily  Vaping Use   Vaping Use: Never used  Substance and Sexual Activity   Alcohol use: Not Currently    Comment: Very rare   Drug use: Not Currently    Types: Marijuana   Sexual activity: Not on file

## 2021-07-22 ENCOUNTER — Other Ambulatory Visit (HOSPITAL_COMMUNITY): Payer: Medicaid Other

## 2021-07-29 ENCOUNTER — Ambulatory Visit (INDEPENDENT_AMBULATORY_CARE_PROVIDER_SITE_OTHER): Payer: Self-pay | Admitting: Orthopedic Surgery

## 2021-07-29 ENCOUNTER — Encounter (HOSPITAL_COMMUNITY): Payer: Self-pay | Admitting: Orthopedic Surgery

## 2021-07-29 ENCOUNTER — Other Ambulatory Visit: Payer: Self-pay

## 2021-07-29 ENCOUNTER — Encounter: Payer: Self-pay | Admitting: Orthopedic Surgery

## 2021-07-29 DIAGNOSIS — M86272 Subacute osteomyelitis, left ankle and foot: Secondary | ICD-10-CM

## 2021-07-29 DIAGNOSIS — M726 Necrotizing fasciitis: Secondary | ICD-10-CM

## 2021-07-29 NOTE — Progress Notes (Signed)
Office Visit Note   Patient: Adam James           Date of Birth: 1971-06-19           MRN: [id number] Visit Date: 07/29/2021              Requested by: No referring provider defined for this encounter. PCP: Pcp, No  Chief Complaint  Patient presents with   Left Foot - Follow-up      HPI: Patient is a 51 year old gentleman who is status post fifth ray amputation left foot.  Patient initially had necrotizing fasciitis involving the foot and leg.  The leg has completely healed however patient had progressive ulceration beneath the fifth metatarsal head necessitating 1/5 ray amputation.  Patient states he has been having increasing instability with his foot and states he is basely walking over the lateral malleolus.  Patient is also status post coronary artery bypass surgery.  Assessment & Plan: Visit Diagnoses:  1. Subacute osteomyelitis, left ankle and foot ([place])   2. Necrotizing fasciitis ([place])     Plan: With the progressive deformity of the ankle ambulation on the fibula with exposed tibial talar joint patient will require a transtibial amputation.  Patient would like to proceed with surgery tomorrow we will set this up at [facility].  Follow-Up Instructions: Return in about 2 weeks (around 08/12/2021).   Ortho Exam  Patient is alert, oriented, no adenopathy, well-dressed, normal affect, normal respiratory effort. Examination patient has necrotic ulcer lateral to the ankle.  Patient has an unstable ankle with cavovarus collapse of his foot and basically walking along the fibula.  The tibiotalar joint is exposed there is necrotic tissue distally the calf is soft nontender is not involved with the infection.  Imaging: No results found. No images are attached to the encounter.  Labs: Lab Results  Component Value Date   HGBA1C 6.5 (H) 05/20/2021   HGBA1C 6.1 (H) 04/28/2021   HGBA1C 8.8 (H) 02/21/2021   ESRSEDRATE 25 (H) 03/14/2021   ESRSEDRATE >140 (H) 02/25/2021   CRP 1.6  (H) 03/14/2021   REPTSTATUS 05/06/2021 FINAL 04/30/2021   GRAMSTAIN NO WBC SEEN NO ORGANISMS SEEN  04/30/2021   CULT  04/30/2021    RARE PROVIDENCIA STUARTII RARE ALCALIGENES FAECALIS RARE NORMAL SKIN FLORA CRITICAL RESULT CALLED TO, READ BACK BY AND VERIFIED WITH: RN MARY S. 1129 [id number] FCP NO ANAEROBES ISOLATED Performed at [facility], [place] [address] [place], [place] [address]    LABORGA PROVIDENCIA STUARTII 04/30/2021   LABORGA ALCALIGENES FAECALIS 04/30/2021     Lab Results  Component Value Date   ALBUMIN 2.6 (L) 07/04/2021   ALBUMIN 2.6 (L) 05/20/2021   ALBUMIN 2.8 (L) 04/28/2021    Lab Results  Component Value Date   MG 2.4 05/26/2021   MG 2.7 (H) 05/23/2021   MG 2.9 (H) 05/23/2021   No results found for: VD25OH  No results found for: PREALBUMIN CBC EXTENDED Latest Ref Rng & Units 05/29/2021 05/28/2021 05/27/2021  WBC 4.0 - 10.5 K/uL 8.7 7.8 8.1  RBC 4.22 - 5.81 MIL/uL 3.21(L) 3.12(L) 3.00(L)  HGB 13.0 - 17.0 g/dL 9.1(L) 9.0(L) 8.6(L)  HCT 39.0 - 52.0 % 28.2(L) 27.6(L) 26.4(L)  PLT 150 - 400 K/uL 321 330 291  NEUTROABS 1.4 - 7.0 x10E3/uL - - -  LYMPHSABS 0.7 - 3.1 x10E3/uL - - -     There is no height or weight on file to calculate BMI.  Orders:  No orders of the defined types  were placed in this encounter.  No orders of the defined types were placed in this encounter.    Procedures: No procedures performed  Clinical Data: No additional findings.  ROS:  All other systems negative, except as noted in the HPI. Review of Systems  Objective: Vital Signs: There were no vitals taken for this visit.  Specialty Comments:  No specialty comments available.  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   Acute osteomyelitis of left foot (HCC)    Acute exacerbation of CHF (congestive heart failure) ([place]) 04/28/2021   Venous stasis ulcer of right calf limited to breakdown of skin without varicose  veins (HCC)    Non-pressure chronic ulcer of other part of left foot limited to breakdown of skin (HCC)    Osteomyelitis (HCC)    Acute on chronic combined systolic and diastolic CHF (congestive heart failure) (HCC)    Acute on chronic heart failure ([place]) 03/23/2021   NSTEMI (non-ST elevated myocardial infarction) (HCC)    Gastroesophageal reflux disease with esophagitis and hemorrhage    Anemia, posthemorrhagic, acute    Acute esophagitis    Gastrointestinal hemorrhage    Necrotizing fasciitis (HCC)    Left leg cellulitis    Diabetic polyneuropathy associated with type 2 diabetes mellitus (HCC)    Severe protein-calorie malnutrition (HCC)    Gangrene of left foot ([place]) 02/25/2021   Type 2 diabetes mellitus (HCC)    Heart failure (HCC)    Dyslipidemia    Diabetic foot ulcer (HCC)    CAD (coronary artery disease)    Cardiomyopathy ([place])    Coronary artery disease involving native coronary artery of native heart with angina pectoris ([place])    Past Medical History:  Diagnosis Date   Anemia    GI bleed; 03/13/11 EGD: relux esophagitis with ulceration/clot   CAD (coronary artery disease)    Cardiomyopathy (HCC)    CHF (congestive heart failure) (HCC)    CKD (chronic kidney disease)    Diabetic foot ulcer (HCC)    Dyslipidemia    Heart failure (HCC)    Myocardial infarction ([place])    Type 2 diabetes mellitus ([place])     Family History  Problem Relation Age of Onset   Hypertension Mother    Heart failure Father    Hypertension Brother    Congestive Heart Failure Brother     Past Surgical History:  Procedure Laterality Date   AMPUTATION Left 04/30/2021   Procedure: LEFT FOOT 5TH RAY AMPUTATION APPLICATION OF A-CELL POWDER;  Surgeon: Newt Minion, MD;  Location: [place];  Service: Orthopedics;  Laterality: Left;   APPENDECTOMY     APPLICATION OF WOUND VAC Left 03/03/2021   Procedure: APPLICATION OF WOUND VAC;  Surgeon: Leandrew Koyanagi, MD;  Location: [place];  Service: Orthopedics;   Laterality: Left;   APPLICATION OF WOUND VAC  03/07/2021   Procedure: APPLICATION OF WOUND VAC;  Surgeon: Newt Minion, MD;  Location: [place];  Service: Orthopedics;;   CARDIAC CATHETERIZATION     COLON SURGERY     CORONARY ARTERY BYPASS GRAFT N/A 05/22/2021   Procedure: CORONARY ARTERY BYPASS GRAFTING (CABG) X 3 USING LEFT INTERNAL MAMMARY ARTERY AND RIGHT ENDOSCOPIC GREATER SAPHENOUS VEIN CONDUITS;  Surgeon: Lajuana Matte, MD;  Location: [place];  Service: Open Heart Surgery;  Laterality: N/A;   ENDOVEIN HARVEST OF GREATER SAPHENOUS VEIN Right 05/22/2021   Procedure: ENDOVEIN HARVEST OF GREATER SAPHENOUS VEIN;  Surgeon: Lajuana Matte, MD;  Location: [place];  Service: Open Heart Surgery;  Laterality: Right;   ESOPHAGOGASTRODUODENOSCOPY (EGD) WITH PROPOFOL N/A 03/12/2021   Procedure: ESOPHAGOGASTRODUODENOSCOPY (EGD) WITH PROPOFOL;  Surgeon: Daryel November, MD;  Location: [place];  Service: Gastroenterology;  Laterality: N/A;   I & D EXTREMITY Left 02/24/2021   Procedure: IRRIGATION AND DEBRIDEMENT EXTREMITY;  Surgeon: Altamese Seville, MD;  Location: [place];  Service: Orthopedics;  Laterality: Left;   I & D EXTREMITY Left 02/26/2021   Procedure: IRRIGATION AND DEBRIDEMENT OF LEG;  Surgeon: Newt Minion, MD;  Location: [place];  Service: Orthopedics;  Laterality: Left;   I & D EXTREMITY Left 03/03/2021   Procedure: LEFT LEG IRRIGATION AND DEBRIDEMENT;  Surgeon: Leandrew Koyanagi, MD;  Location: [place];  Service: Orthopedics;  Laterality: Left;   I & D EXTREMITY Left 03/07/2021   Procedure: REPEAT DEBRIDEMENT LEFT LEG;  Surgeon: Newt Minion, MD;  Location: [place];  Service: Orthopedics;  Laterality: Left;   IR FLUORO GUIDE CV LINE RIGHT  03/10/2021   IR REMOVAL TUN CV CATH W/O FL  03/24/2021   IR US GUIDE VASC ACCESS RIGHT  03/10/2021   LEFT HEART CATH AND CORONARY ANGIOGRAPHY N/A 09/13/2020   Procedure: LEFT HEART CATH AND CORONARY ANGIOGRAPHY;  Surgeon: Troy Sine, MD;  Location: [place] [facility] LAB;  Service: Cardiovascular;  Laterality: N/A;   LEFT HEART CATH AND CORONARY ANGIOGRAPHY N/A 03/28/2021   Procedure: LEFT HEART CATH AND CORONARY ANGIOGRAPHY;  Surgeon: Larey Dresser, MD;  Location: [place] [facility];  Service: Cardiovascular;  Laterality: N/A;   RIGHT HEART CATH N/A 03/26/2021   Procedure: RIGHT HEART CATH;  Surgeon: Larey Dresser, MD;  Location: [place] [facility];  Service: Cardiovascular;  Laterality: N/A;   TEE WITHOUT CARDIOVERSION N/A 05/22/2021   Procedure: TRANSESOPHAGEAL ECHOCARDIOGRAM (TEE);  Surgeon: Lajuana Matte, MD;  Location: [place];  Service: Open Heart Surgery;  Laterality: N/A;   TOE AMPUTATION  2020   Social History   Occupational History   Occupation: none    Comment: former "[employer]"  Tobacco Use   Smoking status: Former    Packs/day: 3.00    Years: 36.00    Pack years: 108.00    Types: Cigarettes    Quit date: 08/27/2020    Years since quitting: 0.9   Smokeless tobacco: Never   Tobacco comments:    5-6 daily  Vaping Use   Vaping Use: Never used  Substance and Sexual Activity   Alcohol use: Not Currently    Comment: Very rare   Drug use: Not Currently    Types: Marijuana   Sexual activity: Not on file

## 2021-07-29 NOTE — Addendum Note (Signed)
Addended by: Meridee Score on: 07/29/2021 02:20 PM   Modules accepted: Orders

## 2021-07-29 NOTE — Progress Notes (Signed)
Spoke with pt for pre-op call. Pt recently had CABG in October, 2022. Pt states he is feeling shortness of breath, states his fluid pills were stopped because his blood pressure was low. Denies any chest pain. Pt is a type 2 diabetic. Last A1C was 6.5 on 05/20/21. States his fasting blood sugar is usually around 150. Instructed pt to hold his Glyburide this evening and in the AM. He is to hold Metformin in the AM. Instructed pt to check his blood sugar when he gets up in the AM and every 2 hours until he leaves for the hospital.  If blood sugar is 70 or below, treat with 1/2 cup of clear juice (apple or cranberry) and recheck blood sugar 15 minutes after drinking juice. If blood sugar continues to be 70 or below, call the Short Stay department and ask to speak to a nurse. Pt voiced understanding.  Pt will need Covid testing on arrival.

## 2021-07-30 ENCOUNTER — Inpatient Hospital Stay (HOSPITAL_COMMUNITY)
Admission: RE | Admit: 2021-07-30 | Discharge: 2021-08-05 | DRG: 616 | Disposition: A | Discharge status: Home / Self Care | Payer: Self-pay | Attending: Orthopedic Surgery | Admitting: Orthopedic Surgery

## 2021-07-30 ENCOUNTER — Encounter (HOSPITAL_COMMUNITY): Payer: Self-pay | Admitting: Orthopedic Surgery

## 2021-07-30 ENCOUNTER — Inpatient Hospital Stay (HOSPITAL_COMMUNITY): Payer: Self-pay | Admitting: Anesthesiology

## 2021-07-30 ENCOUNTER — Other Ambulatory Visit: Payer: Self-pay

## 2021-07-30 ENCOUNTER — Encounter (HOSPITAL_COMMUNITY)
Admission: RE | Disposition: A | Discharge status: Home / Self Care | Payer: Self-pay | Source: Home / Self Care | Attending: Orthopedic Surgery

## 2021-07-30 DIAGNOSIS — I130 Hypertensive heart and chronic kidney disease with heart failure and stage 1 through stage 4 chronic kidney disease, or unspecified chronic kidney disease: Secondary | ICD-10-CM | POA: Diagnosis present

## 2021-07-30 DIAGNOSIS — M726 Necrotizing fasciitis: Secondary | ICD-10-CM | POA: Diagnosis present

## 2021-07-30 DIAGNOSIS — I252 Old myocardial infarction: Secondary | ICD-10-CM

## 2021-07-30 DIAGNOSIS — S88112A Complete traumatic amputation at level between knee and ankle, left lower leg, initial encounter: Secondary | ICD-10-CM | POA: Diagnosis present

## 2021-07-30 DIAGNOSIS — E1122 Type 2 diabetes mellitus with diabetic chronic kidney disease: Secondary | ICD-10-CM | POA: Diagnosis present

## 2021-07-30 DIAGNOSIS — E1169 Type 2 diabetes mellitus with other specified complication: Principal | ICD-10-CM | POA: Diagnosis present

## 2021-07-30 DIAGNOSIS — I2510 Atherosclerotic heart disease of native coronary artery without angina pectoris: Secondary | ICD-10-CM | POA: Diagnosis present

## 2021-07-30 DIAGNOSIS — Z89421 Acquired absence of other right toe(s): Secondary | ICD-10-CM

## 2021-07-30 DIAGNOSIS — M86272 Subacute osteomyelitis, left ankle and foot: Secondary | ICD-10-CM

## 2021-07-30 DIAGNOSIS — E785 Hyperlipidemia, unspecified: Secondary | ICD-10-CM | POA: Diagnosis present

## 2021-07-30 DIAGNOSIS — Z8249 Family history of ischemic heart disease and other diseases of the circulatory system: Secondary | ICD-10-CM

## 2021-07-30 DIAGNOSIS — E11622 Type 2 diabetes mellitus with other skin ulcer: Secondary | ICD-10-CM

## 2021-07-30 DIAGNOSIS — Y838 Other surgical procedures as the cause of abnormal reaction of the patient, or of later complication, without mention of misadventure at the time of the procedure: Secondary | ICD-10-CM | POA: Diagnosis present

## 2021-07-30 DIAGNOSIS — Z885 Allergy status to narcotic agent status: Secondary | ICD-10-CM

## 2021-07-30 DIAGNOSIS — N189 Chronic kidney disease, unspecified: Secondary | ICD-10-CM | POA: Diagnosis present

## 2021-07-30 DIAGNOSIS — Z951 Presence of aortocoronary bypass graft: Secondary | ICD-10-CM

## 2021-07-30 DIAGNOSIS — Z20822 Contact with and (suspected) exposure to covid-19: Secondary | ICD-10-CM | POA: Diagnosis present

## 2021-07-30 DIAGNOSIS — I509 Heart failure, unspecified: Secondary | ICD-10-CM | POA: Diagnosis present

## 2021-07-30 DIAGNOSIS — Z9049 Acquired absence of other specified parts of digestive tract: Secondary | ICD-10-CM

## 2021-07-30 DIAGNOSIS — M86172 Other acute osteomyelitis, left ankle and foot: Secondary | ICD-10-CM

## 2021-07-30 DIAGNOSIS — T8130XA Disruption of wound, unspecified, initial encounter: Secondary | ICD-10-CM | POA: Diagnosis present

## 2021-07-30 DIAGNOSIS — Z87891 Personal history of nicotine dependence: Secondary | ICD-10-CM

## 2021-07-30 HISTORY — DX: Essential (primary) hypertension: I10

## 2021-07-30 HISTORY — PX: APPLICATION OF WOUND VAC: SHX5189

## 2021-07-30 HISTORY — DX: Dyspnea, unspecified: R06.00

## 2021-07-30 HISTORY — PX: AMPUTATION: SHX166

## 2021-07-30 LAB — SURGICAL PCR SCREEN
MRSA, PCR: NEGATIVE
Staphylococcus aureus: NEGATIVE

## 2021-07-30 LAB — GLUCOSE, CAPILLARY
Glucose-Capillary: 143 mg/dL — ABNORMAL HIGH (ref 70–99)
Glucose-Capillary: 169 mg/dL — ABNORMAL HIGH (ref 70–99)
Glucose-Capillary: 186 mg/dL — ABNORMAL HIGH (ref 70–99)
Glucose-Capillary: 266 mg/dL — ABNORMAL HIGH (ref 70–99)

## 2021-07-30 LAB — SARS CORONAVIRUS 2 BY RT PCR (HOSPITAL ORDER, PERFORMED IN ~~LOC~~ HOSPITAL LAB): SARS Coronavirus 2: NEGATIVE

## 2021-07-30 LAB — POCT I-STAT, CHEM 8
BUN: 41 mg/dL — ABNORMAL HIGH (ref 6–20)
Calcium, Ion: 1.06 mmol/L — ABNORMAL LOW (ref 1.15–1.40)
Chloride: 100 mmol/L (ref 98–111)
Creatinine, Ser: 2.6 mg/dL — ABNORMAL HIGH (ref 0.61–1.24)
Glucose, Bld: 152 mg/dL — ABNORMAL HIGH (ref 70–99)
HCT: 27 % — ABNORMAL LOW (ref 39.0–52.0)
Hemoglobin: 9.2 g/dL — ABNORMAL LOW (ref 13.0–17.0)
Potassium: 3.8 mmol/L (ref 3.5–5.1)
Sodium: 131 mmol/L — ABNORMAL LOW (ref 135–145)
TCO2: 20 mmol/L — ABNORMAL LOW (ref 22–32)

## 2021-07-30 LAB — HEMOGLOBIN A1C
Hgb A1c MFr Bld: 7.9 % — ABNORMAL HIGH (ref 4.8–5.6)
Mean Plasma Glucose: 180.03 mg/dL

## 2021-07-30 SURGERY — AMPUTATION BELOW KNEE
Anesthesia: Regional | Site: Knee | Laterality: Left

## 2021-07-30 MED ORDER — ACETAMINOPHEN 325 MG PO TABS
325.0000 mg | ORAL_TABLET | Freq: Four times a day (QID) | ORAL | Status: DC | PRN
  Administered 2021-07-30 – 2021-07-31 (×3): 325 mg via ORAL
  Filled 2021-07-30 (×3): qty 1

## 2021-07-30 MED ORDER — MEPERIDINE HCL 25 MG/ML IJ SOLN: 6.2500 mg | INTRAMUSCULAR | Status: DC | PRN

## 2021-07-30 MED ORDER — ATORVASTATIN CALCIUM 80 MG PO TABS
80.0000 mg | ORAL_TABLET | Freq: Every day | ORAL | Status: DC
  Administered 2021-07-30 – 2021-08-05 (×7): 80 mg via ORAL
  Filled 2021-07-30 (×7): qty 1

## 2021-07-30 MED ORDER — OXYCODONE HCL 5 MG/5ML PO SOLN: 5.0000 mg | Freq: Once | ORAL | Status: DC | PRN

## 2021-07-30 MED ORDER — SODIUM CHLORIDE (PF) 0.9 % IJ SOLN
INTRAMUSCULAR | Status: DC | PRN
  Administered 2021-07-30: 20 mL via PERINEURAL

## 2021-07-30 MED ORDER — CHLORHEXIDINE GLUCONATE 4 % EX LIQD: 60.0000 mL | Freq: Once | CUTANEOUS | Status: DC

## 2021-07-30 MED ORDER — ALUM & MAG HYDROXIDE-SIMETH 200-200-20 MG/5ML PO SUSP: 15.0000 mL | ORAL | Status: DC | PRN

## 2021-07-30 MED ORDER — FENTANYL CITRATE (PF) 100 MCG/2ML IJ SOLN
INTRAMUSCULAR | Status: AC
  Filled 2021-07-30: qty 2

## 2021-07-30 MED ORDER — CHLORHEXIDINE GLUCONATE 0.12 % MT SOLN
OROMUCOSAL | Status: AC
  Administered 2021-07-30: 15 mL via OROMUCOSAL
  Filled 2021-07-30: qty 15

## 2021-07-30 MED ORDER — LABETALOL HCL 5 MG/ML IV SOLN: 10.0000 mg | INTRAVENOUS | Status: DC | PRN

## 2021-07-30 MED ORDER — GLYBURIDE 5 MG PO TABS
5.0000 mg | ORAL_TABLET | Freq: Two times a day (BID) | ORAL | Status: DC
  Administered 2021-07-31: 5 mg via ORAL
  Filled 2021-07-30 (×3): qty 1

## 2021-07-30 MED ORDER — BUPIVACAINE-EPINEPHRINE (PF) 0.5% -1:200000 IJ SOLN
INTRAMUSCULAR | Status: DC | PRN
  Administered 2021-07-30: 10 mL via PERINEURAL

## 2021-07-30 MED ORDER — MIDAZOLAM HCL 2 MG/2ML IJ SOLN
INTRAMUSCULAR | Status: DC | PRN
Start: 2021-07-30 — End: 2021-07-30
  Administered 2021-07-30: 2 mg via INTRAVENOUS

## 2021-07-30 MED ORDER — ACETAMINOPHEN 160 MG/5ML PO SOLN: 325.0000 mg | ORAL | Status: DC | PRN

## 2021-07-30 MED ORDER — CHLORHEXIDINE GLUCONATE 0.12 % MT SOLN: 15.0000 mL | Freq: Once | OROMUCOSAL | Status: AC

## 2021-07-30 MED ORDER — INSULIN ASPART 100 UNIT/ML IJ SOLN
4.0000 [IU] | Freq: Three times a day (TID) | INTRAMUSCULAR | Status: DC
  Administered 2021-07-30 – 2021-08-05 (×16): 4 [IU] via SUBCUTANEOUS

## 2021-07-30 MED ORDER — GUAIFENESIN-DM 100-10 MG/5ML PO SYRP: 15.0000 mL | ORAL_SOLUTION | ORAL | Status: DC | PRN

## 2021-07-30 MED ORDER — LACTATED RINGERS IV SOLN: INTRAVENOUS | Status: DC

## 2021-07-30 MED ORDER — DOCUSATE SODIUM 100 MG PO CAPS
100.0000 mg | ORAL_CAPSULE | Freq: Every day | ORAL | Status: DC
  Administered 2021-07-31 – 2021-08-02 (×3): 100 mg via ORAL
  Filled 2021-07-30 (×6): qty 1

## 2021-07-30 MED ORDER — ZINC SULFATE 220 (50 ZN) MG PO CAPS
220.0000 mg | ORAL_CAPSULE | Freq: Every day | ORAL | Status: DC
  Administered 2021-07-30 – 2021-08-05 (×7): 220 mg via ORAL
  Filled 2021-07-30 (×7): qty 1

## 2021-07-30 MED ORDER — DIGOXIN 125 MCG PO TABS
0.1250 mg | ORAL_TABLET | Freq: Every day | ORAL | Status: DC
  Administered 2021-07-30 – 2021-08-05 (×6): 0.125 mg via ORAL
  Filled 2021-07-30 (×7): qty 1

## 2021-07-30 MED ORDER — JUVEN PO PACK
1.0000 | PACK | Freq: Two times a day (BID) | ORAL | Status: DC
  Administered 2021-07-31 – 2021-08-03 (×8): 1 via ORAL
  Filled 2021-07-30 (×9): qty 1

## 2021-07-30 MED ORDER — ETOMIDATE 2 MG/ML IV SOLN
INTRAVENOUS | Status: AC
  Filled 2021-07-30: qty 10

## 2021-07-30 MED ORDER — HYDROMORPHONE HCL 1 MG/ML IJ SOLN
0.5000 mg | INTRAMUSCULAR | Status: DC | PRN
  Administered 2021-07-31: 1 mg via INTRAVENOUS
  Filled 2021-07-30: qty 1

## 2021-07-30 MED ORDER — PHENYLEPHRINE HCL-NACL 20-0.9 MG/250ML-% IV SOLN
INTRAVENOUS | Status: DC | PRN
Start: 2021-07-30 — End: 2021-07-30
  Administered 2021-07-30: 50 ug/min via INTRAVENOUS

## 2021-07-30 MED ORDER — METFORMIN HCL 500 MG PO TABS
500.0000 mg | ORAL_TABLET | Freq: Two times a day (BID) | ORAL | Status: DC
  Administered 2021-07-30 – 2021-07-31 (×2): 500 mg via ORAL
  Filled 2021-07-30 (×2): qty 1

## 2021-07-30 MED ORDER — CEFAZOLIN SODIUM-DEXTROSE 2-4 GM/100ML-% IV SOLN
2.0000 g | Freq: Three times a day (TID) | INTRAVENOUS | Status: AC
  Administered 2021-07-30 – 2021-07-31 (×2): 2 g via INTRAVENOUS
  Filled 2021-07-30 (×2): qty 100

## 2021-07-30 MED ORDER — ORAL CARE MOUTH RINSE: 15.0000 mL | Freq: Once | OROMUCOSAL | Status: AC

## 2021-07-30 MED ORDER — POTASSIUM CHLORIDE CRYS ER 20 MEQ PO TBCR: 20.0000 meq | EXTENDED_RELEASE_TABLET | Freq: Every day | ORAL | Status: DC | PRN

## 2021-07-30 MED ORDER — FENTANYL CITRATE (PF) 250 MCG/5ML IJ SOLN
INTRAMUSCULAR | Status: AC
  Filled 2021-07-30: qty 5

## 2021-07-30 MED ORDER — PROPOFOL 10 MG/ML IV BOLUS
INTRAVENOUS | Status: AC
  Filled 2021-07-30: qty 20

## 2021-07-30 MED ORDER — BUPIVACAINE LIPOSOME 1.3 % IJ SUSP
INTRAMUSCULAR | Status: DC | PRN
  Administered 2021-07-30: 10 mL via PERINEURAL

## 2021-07-30 MED ORDER — METOPROLOL SUCCINATE ER 25 MG PO TB24
ORAL_TABLET | ORAL | Status: AC
  Administered 2021-07-30: 12.5 mg via ORAL
  Filled 2021-07-30: qty 1

## 2021-07-30 MED ORDER — PROPOFOL 10 MG/ML IV BOLUS
INTRAVENOUS | Status: DC | PRN
Start: 2021-07-30 — End: 2021-07-30
  Administered 2021-07-30: 110 mg via INTRAVENOUS

## 2021-07-30 MED ORDER — MAGNESIUM CITRATE PO SOLN: 1.0000 | Freq: Once | ORAL | Status: DC | PRN

## 2021-07-30 MED ORDER — METOPROLOL TARTRATE 5 MG/5ML IV SOLN: 2.0000 mg | INTRAVENOUS | Status: DC | PRN

## 2021-07-30 MED ORDER — ASCORBIC ACID 500 MG PO TABS
1000.0000 mg | ORAL_TABLET | Freq: Every day | ORAL | Status: DC
  Administered 2021-07-30 – 2021-08-05 (×7): 1000 mg via ORAL
  Filled 2021-07-30 (×7): qty 2

## 2021-07-30 MED ORDER — CEFAZOLIN SODIUM-DEXTROSE 2-4 GM/100ML-% IV SOLN
2.0000 g | INTRAVENOUS | Status: AC
  Administered 2021-07-30: 2 g via INTRAVENOUS

## 2021-07-30 MED ORDER — INSULIN ASPART 100 UNIT/ML IJ SOLN
0.0000 [IU] | Freq: Three times a day (TID) | INTRAMUSCULAR | Status: DC
  Administered 2021-07-30 – 2021-07-31 (×2): 3 [IU] via SUBCUTANEOUS
  Administered 2021-07-31: 11 [IU] via SUBCUTANEOUS
  Administered 2021-07-31: 5 [IU] via SUBCUTANEOUS
  Administered 2021-08-01 – 2021-08-02 (×3): 2 [IU] via SUBCUTANEOUS
  Administered 2021-08-02 – 2021-08-03 (×3): 3 [IU] via SUBCUTANEOUS
  Administered 2021-08-04: 4 [IU] via SUBCUTANEOUS
  Administered 2021-08-04 (×2): 3 [IU] via SUBCUTANEOUS
  Administered 2021-08-05: 5 [IU] via SUBCUTANEOUS

## 2021-07-30 MED ORDER — MIDAZOLAM HCL 2 MG/2ML IJ SOLN
INTRAMUSCULAR | Status: AC
  Filled 2021-07-30: qty 2

## 2021-07-30 MED ORDER — POLYETHYLENE GLYCOL 3350 17 G PO PACK: 17.0000 g | PACK | Freq: Every day | ORAL | Status: DC | PRN

## 2021-07-30 MED ORDER — FENTANYL CITRATE (PF) 100 MCG/2ML IJ SOLN: 100.0000 ug | Freq: Once | INTRAMUSCULAR | Status: AC

## 2021-07-30 MED ORDER — METOPROLOL SUCCINATE ER 25 MG PO TB24
12.5000 mg | ORAL_TABLET | Freq: Every day | ORAL | Status: DC
  Administered 2021-08-03 – 2021-08-04 (×2): 12.5 mg via ORAL
  Filled 2021-07-30 (×3): qty 1

## 2021-07-30 MED ORDER — FENTANYL CITRATE (PF) 100 MCG/2ML IJ SOLN
INTRAMUSCULAR | Status: AC
  Administered 2021-07-30: 100 ug via INTRAVENOUS
  Filled 2021-07-30: qty 2

## 2021-07-30 MED ORDER — AMIODARONE HCL 200 MG PO TABS
200.0000 mg | ORAL_TABLET | Freq: Every day | ORAL | Status: DC
  Administered 2021-07-30 – 2021-08-05 (×7): 200 mg via ORAL
  Filled 2021-07-30 (×7): qty 1

## 2021-07-30 MED ORDER — ACETAMINOPHEN 325 MG PO TABS: 325.0000 mg | ORAL_TABLET | ORAL | Status: DC | PRN

## 2021-07-30 MED ORDER — FENTANYL CITRATE (PF) 250 MCG/5ML IJ SOLN
INTRAMUSCULAR | Status: DC | PRN
Start: 2021-07-30 — End: 2021-07-30
  Administered 2021-07-30: 50 ug via INTRAVENOUS

## 2021-07-30 MED ORDER — PHENOL 1.4 % MT LIQD: 1.0000 | OROMUCOSAL | Status: DC | PRN

## 2021-07-30 MED ORDER — OXYCODONE HCL 5 MG PO TABS
5.0000 mg | ORAL_TABLET | ORAL | Status: DC | PRN
  Administered 2021-08-01: 10 mg via ORAL
  Administered 2021-08-02: 5 mg via ORAL
  Administered 2021-08-02: 10 mg via ORAL
  Filled 2021-07-30 (×5): qty 2

## 2021-07-30 MED ORDER — FENTANYL CITRATE (PF) 100 MCG/2ML IJ SOLN
25.0000 ug | INTRAMUSCULAR | Status: DC | PRN
  Administered 2021-07-30 (×2): 50 ug via INTRAVENOUS
  Administered 2021-07-30: 25 ug via INTRAVENOUS

## 2021-07-30 MED ORDER — OXYCODONE HCL 5 MG PO TABS: 5.0000 mg | ORAL_TABLET | Freq: Once | ORAL | Status: DC | PRN

## 2021-07-30 MED ORDER — ONDANSETRON HCL 4 MG/2ML IJ SOLN: 4.0000 mg | Freq: Once | INTRAMUSCULAR | Status: DC | PRN

## 2021-07-30 MED ORDER — HYDRALAZINE HCL 20 MG/ML IJ SOLN: 5.0000 mg | INTRAMUSCULAR | Status: DC | PRN

## 2021-07-30 MED ORDER — CEFAZOLIN SODIUM-DEXTROSE 2-4 GM/100ML-% IV SOLN
INTRAVENOUS | Status: AC
  Filled 2021-07-30: qty 100

## 2021-07-30 MED ORDER — ONDANSETRON HCL 4 MG/2ML IJ SOLN
INTRAMUSCULAR | Status: DC | PRN
Start: 2021-07-30 — End: 2021-07-30
  Administered 2021-07-30: 4 mg via INTRAVENOUS

## 2021-07-30 MED ORDER — ROPIVACAINE HCL 5 MG/ML IJ SOLN: INTRAMUSCULAR | Status: DC | PRN

## 2021-07-30 MED ORDER — BISACODYL 5 MG PO TBEC: 5.0000 mg | DELAYED_RELEASE_TABLET | Freq: Every day | ORAL | Status: DC | PRN

## 2021-07-30 MED ORDER — OXYCODONE HCL 5 MG PO TABS
10.0000 mg | ORAL_TABLET | ORAL | Status: DC | PRN
  Administered 2021-07-30: 15 mg via ORAL
  Administered 2021-07-30 – 2021-07-31 (×3): 10 mg via ORAL
  Administered 2021-08-01: 15 mg via ORAL
  Administered 2021-08-01: 10 mg via ORAL
  Administered 2021-08-03: 15 mg via ORAL
  Filled 2021-07-30 (×3): qty 2
  Filled 2021-07-30 (×3): qty 3

## 2021-07-30 MED ORDER — MIDAZOLAM HCL 2 MG/2ML IJ SOLN
INTRAMUSCULAR | Status: AC
  Administered 2021-07-30: 2 mg via INTRAVENOUS
  Filled 2021-07-30: qty 2

## 2021-07-30 MED ORDER — 0.9 % SODIUM CHLORIDE (POUR BTL) OPTIME
TOPICAL | Status: DC | PRN
Start: 2021-07-30 — End: 2021-07-30
  Administered 2021-07-30: 1000 mL

## 2021-07-30 MED ORDER — MAGNESIUM SULFATE 2 GM/50ML IV SOLN: 2.0000 g | Freq: Every day | INTRAVENOUS | Status: DC | PRN

## 2021-07-30 MED ORDER — ONDANSETRON HCL 4 MG/2ML IJ SOLN: 4.0000 mg | Freq: Four times a day (QID) | INTRAMUSCULAR | Status: DC | PRN

## 2021-07-30 MED ORDER — DEXAMETHASONE SODIUM PHOSPHATE 10 MG/ML IJ SOLN
INTRAMUSCULAR | Status: DC | PRN
  Administered 2021-07-30: 10 mg via INTRAVENOUS

## 2021-07-30 MED ORDER — POVIDONE-IODINE 10 % EX SWAB
2.0000 "application " | Freq: Once | CUTANEOUS | Status: AC
  Administered 2021-07-30: 2 via TOPICAL

## 2021-07-30 MED ORDER — MIDAZOLAM HCL 2 MG/2ML IJ SOLN: 2.0000 mg | Freq: Once | INTRAMUSCULAR | Status: AC

## 2021-07-30 MED ORDER — PANTOPRAZOLE SODIUM 40 MG PO TBEC
40.0000 mg | DELAYED_RELEASE_TABLET | Freq: Every day | ORAL | Status: DC
  Administered 2021-07-30 – 2021-08-05 (×7): 40 mg via ORAL
  Filled 2021-07-30 (×7): qty 1

## 2021-07-30 MED ORDER — SODIUM CHLORIDE 0.9 % IV SOLN: INTRAVENOUS | Status: DC

## 2021-07-30 MED ORDER — FERROUS SULFATE 325 (65 FE) MG PO TABS
325.0000 mg | ORAL_TABLET | Freq: Every day | ORAL | Status: DC
  Administered 2021-07-30 – 2021-08-01 (×3): 325 mg via ORAL
  Filled 2021-07-30 (×4): qty 1

## 2021-07-30 MED ORDER — KETAMINE HCL 10 MG/ML IJ SOLN
INTRAMUSCULAR | Status: DC | PRN
  Administered 2021-07-30: 50 mg via INTRAVENOUS

## 2021-07-30 MED ORDER — LIDOCAINE 2% (20 MG/ML) 5 ML SYRINGE
INTRAMUSCULAR | Status: DC | PRN
Start: 2021-07-30 — End: 2021-07-30
  Administered 2021-07-30: 100 mg via INTRAVENOUS

## 2021-07-30 MED ORDER — METOPROLOL SUCCINATE ER 25 MG PO TB24
12.5000 mg | ORAL_TABLET | Freq: Every day | ORAL | Status: DC
  Administered 2021-07-30: 12.5 mg via ORAL

## 2021-07-30 MED ORDER — KETAMINE HCL 50 MG/5ML IJ SOSY
PREFILLED_SYRINGE | INTRAMUSCULAR | Status: AC
  Filled 2021-07-30: qty 5

## 2021-07-30 SURGICAL SUPPLY — 38 items
BAG COUNTER SPONGE SURGICOUNT (BAG) ×1 IMPLANT
BLADE SAW RECIP 87.9 MT (BLADE) ×3 IMPLANT
BLADE SURG 21 STRL SS (BLADE) ×3 IMPLANT
BNDG COHESIVE 6X5 TAN NS LF (GAUZE/BANDAGES/DRESSINGS) ×1 IMPLANT
BNDG COHESIVE 6X5 TAN STRL LF (GAUZE/BANDAGES/DRESSINGS) IMPLANT
CANISTER WOUND CARE 500ML ATS (WOUND CARE) ×3 IMPLANT
COVER SURGICAL LIGHT HANDLE (MISCELLANEOUS) ×3 IMPLANT
CUFF TOURN SGL QUICK 34 (TOURNIQUET CUFF) ×1
CUFF TRNQT CYL 34X4.125X (TOURNIQUET CUFF) ×2 IMPLANT
DRAPE DERMATAC (DRAPES) ×2 IMPLANT
DRAPE INCISE IOBAN 66X45 STRL (DRAPES) ×3 IMPLANT
DRAPE U-SHAPE 47X51 STRL (DRAPES) ×3 IMPLANT
DRESSING PREVENA PLUS CUSTOM (GAUZE/BANDAGES/DRESSINGS) ×2 IMPLANT
DRSG PREVENA PLUS CUSTOM (GAUZE/BANDAGES/DRESSINGS) ×3
DURAPREP 26ML APPLICATOR (WOUND CARE) ×3 IMPLANT
ELECT REM PT RETURN 9FT ADLT (ELECTROSURGICAL) ×3
ELECTRODE REM PT RTRN 9FT ADLT (ELECTROSURGICAL) ×2 IMPLANT
GLOVE SURG ORTHO LTX SZ9 (GLOVE) ×3 IMPLANT
GLOVE SURG UNDER POLY LF SZ9 (GLOVE) ×3 IMPLANT
GOWN STRL REUS W/ TWL XL LVL3 (GOWN DISPOSABLE) ×4 IMPLANT
GOWN STRL REUS W/TWL XL LVL3 (GOWN DISPOSABLE) ×2
KIT BASIN OR (CUSTOM PROCEDURE TRAY) ×3 IMPLANT
KIT TURNOVER KIT B (KITS) ×3 IMPLANT
MANIFOLD NEPTUNE II (INSTRUMENTS) ×3 IMPLANT
NS IRRIG 1000ML POUR BTL (IV SOLUTION) ×3 IMPLANT
PACK ORTHO EXTREMITY (CUSTOM PROCEDURE TRAY) ×3 IMPLANT
PAD ARMBOARD 7.5X6 YLW CONV (MISCELLANEOUS) ×3 IMPLANT
PREVENA RESTOR ARTHOFORM 46X30 (CANNISTER) ×3 IMPLANT
SPONGE T-LAP 18X18 ~~LOC~~+RFID (SPONGE) ×2 IMPLANT
STAPLER VISISTAT 35W (STAPLE) ×1 IMPLANT
STOCKINETTE IMPERVIOUS LG (DRAPES) ×3 IMPLANT
SUT ETHILON 2 0 PSLX (SUTURE) IMPLANT
SUT SILK 2 0 (SUTURE) ×1
SUT SILK 2-0 18XBRD TIE 12 (SUTURE) ×2 IMPLANT
SUT VIC AB 1 CTX 27 (SUTURE) ×6 IMPLANT
TOWEL GREEN STERILE (TOWEL DISPOSABLE) ×3 IMPLANT
TUBE CONNECTING 12X1/4 (SUCTIONS) ×3 IMPLANT
YANKAUER SUCT BULB TIP NO VENT (SUCTIONS) ×3 IMPLANT

## 2021-07-30 NOTE — Plan of Care (Signed)

## 2021-07-30 NOTE — H&P (Signed)
Adam James is an 51 y.o. male.   Chief Complaint: Progressive dehiscence left foot wound with progressive deformity left ankle. HPI: Patient is a 51 year old gentleman who is status post fifth ray amputation left foot.  Patient initially had necrotizing fasciitis involving the foot and leg.  The leg has completely healed however patient had progressive ulceration beneath the fifth metatarsal head necessitating 1/5 ray amputation.  Patient states he has been having increasing instability with his foot and states he is basely walking over the lateral malleolus.  Patient is also status post coronary artery bypass surgery.  Past Medical History:  Diagnosis Date   Anemia    GI bleed; 03/13/11 EGD: relux esophagitis with ulceration/clot   CAD (coronary artery disease)    Cardiomyopathy (HCC)    CHF (congestive heart failure) (HCC)    CKD (chronic kidney disease)    Diabetic foot ulcer (HCC)    Dyslipidemia    Dyspnea    Heart failure (HCC)    Hypertension    Myocardial infarction (HCC)    Type 2 diabetes mellitus (HCC)     Past Surgical History:  Procedure Laterality Date   AMPUTATION Left 04/30/2021   Procedure: LEFT FOOT 5TH RAY AMPUTATION APPLICATION OF A-CELL POWDER;  Surgeon: [name], MD;  Location: [facility] OR;  Service: Orthopedics;  Laterality: Left;   APPENDECTOMY     APPLICATION OF WOUND VAC Left 03/03/2021   Procedure: APPLICATION OF WOUND VAC;  Surgeon: Tarry Kos, MD;  Location: [facility] OR;  Service: Orthopedics;  Laterality: Left;   APPLICATION OF WOUND VAC  03/07/2021   Procedure: APPLICATION OF WOUND VAC;  Surgeon: [name], MD;  Location: [facility] OR;  Service: Orthopedics;;   CARDIAC CATHETERIZATION     COLON SURGERY     CORONARY ARTERY BYPASS GRAFT N/A 05/22/2021   Procedure: CORONARY ARTERY BYPASS GRAFTING (CABG) X 3 USING LEFT INTERNAL MAMMARY ARTERY AND RIGHT ENDOSCOPIC GREATER SAPHENOUS VEIN CONDUITS;  Surgeon: Corliss Skains, MD;  Location: [facility] OR;  Service:  Open Heart Surgery;  Laterality: N/A;   ENDOVEIN HARVEST OF GREATER SAPHENOUS VEIN Right 05/22/2021   Procedure: ENDOVEIN HARVEST OF GREATER SAPHENOUS VEIN;  Surgeon: Corliss Skains, MD;  Location: [facility] OR;  Service: Open Heart Surgery;  Laterality: Right;   ESOPHAGOGASTRODUODENOSCOPY (EGD) WITH PROPOFOL N/A 03/12/2021   Procedure: ESOPHAGOGASTRODUODENOSCOPY (EGD) WITH PROPOFOL;  Surgeon: Jenel Lucks, MD;  Location: [facility] ENDOSCOPY;  Service: Gastroenterology;  Laterality: N/A;   I & D EXTREMITY Left 02/24/2021   Procedure: IRRIGATION AND DEBRIDEMENT EXTREMITY;  Surgeon: Myrene Galas, MD;  Location: [facility] OR;  Service: Orthopedics;  Laterality: Left;   I & D EXTREMITY Left 02/26/2021   Procedure: IRRIGATION AND DEBRIDEMENT OF LEG;  Surgeon: [name], MD;  Location: [facility] OR;  Service: Orthopedics;  Laterality: Left;   I & D EXTREMITY Left 03/03/2021   Procedure: LEFT LEG IRRIGATION AND DEBRIDEMENT;  Surgeon: Tarry Kos, MD;  Location: [facility] OR;  Service: Orthopedics;  Laterality: Left;   I & D EXTREMITY Left 03/07/2021   Procedure: REPEAT DEBRIDEMENT LEFT LEG;  Surgeon: [name], MD;  Location: [facility] OR;  Service: Orthopedics;  Laterality: Left;   IR FLUORO GUIDE CV LINE RIGHT  03/10/2021   IR REMOVAL TUN CV CATH W/O FL  03/24/2021   IR US GUIDE VASC ACCESS RIGHT  03/10/2021   LEFT HEART CATH AND CORONARY ANGIOGRAPHY N/A 09/13/2020   Procedure: LEFT HEART CATH AND CORONARY ANGIOGRAPHY;  Surgeon: Tresa Endo,  Clovis Pu, MD;  Location: [facility] INVASIVE CV LAB;  Service: Cardiovascular;  Laterality: N/A;   LEFT HEART CATH AND CORONARY ANGIOGRAPHY N/A 03/28/2021   Procedure: LEFT HEART CATH AND CORONARY ANGIOGRAPHY;  Surgeon: Laurey Morale, MD;  Location: [facility] INVASIVE CV LAB;  Service: Cardiovascular;  Laterality: N/A;   RIGHT HEART CATH N/A 03/26/2021   Procedure: RIGHT HEART CATH;  Surgeon: Laurey Morale, MD;  Location: [facility] INVASIVE CV LAB;  Service: Cardiovascular;  Laterality: N/A;   TEE WITHOUT  CARDIOVERSION N/A 05/22/2021   Procedure: TRANSESOPHAGEAL ECHOCARDIOGRAM (TEE);  Surgeon: Corliss Skains, MD;  Location: [facility] OR;  Service: Open Heart Surgery;  Laterality: N/A;   TOE AMPUTATION  2020    Family History  Problem Relation Age of Onset   Hypertension Mother    Heart failure Father    Hypertension Brother    Congestive Heart Failure Brother    Social History:  reports that he quit smoking about 11 months ago. His smoking use included cigarettes. He has a 108.00 pack-year smoking history. He has never used smokeless tobacco. He reports that he does not currently use alcohol. He reports that he does not currently use drugs after having used the following drugs: Marijuana.  Allergies:  Allergies  Allergen Reactions   Morphine Itching    No medications prior to admission.    No results found for this or any previous visit (from the past 48 hour(s)). No results found.  Review of Systems  All other systems reviewed and are negative.  There were no vitals taken for this visit. Physical Exam  Patient is alert, oriented, no adenopathy, well-dressed, normal affect, normal respiratory effort. Examination patient has necrotic ulcer lateral to the ankle.  Patient has an unstable ankle with cavovarus collapse of his foot and basically walking along the fibula.  The tibiotalar joint is exposed there is necrotic tissue distally the calf is soft nontender is not involved with the infection. Assessment/Plan 1. Subacute osteomyelitis, left ankle and foot (HCC)   2. Necrotizing fasciitis (HCC)       Plan: With the progressive deformity of the ankle ambulation on the fibula with exposed tibial talar joint patient will require a transtibial amputation.  [name], MD 07/30/2021, 7:40 AM

## 2021-07-30 NOTE — Anesthesia Preprocedure Evaluation (Addendum)
Anesthesia Evaluation  Patient identified by MRN, date of birth, ID band Patient awake    Reviewed: Allergy & Precautions, H&P , NPO status , Patient's Chart, lab work & pertinent test results, reviewed documented beta blocker date and time   Airway Mallampati: II  TM Distance: >3 FB Neck ROM: full    Dental no notable dental hx. (+) Poor Dentition, Chipped, Missing, Loose,    Pulmonary shortness of breath, former smoker,    Pulmonary exam normal breath sounds clear to auscultation       Cardiovascular Exercise Tolerance: Good hypertension, Pt. on medications + angina + CAD, + Past MI, + CABG and +CHF   Rhythm:regular Rate:Normal  echo 10/22 Left Ventricle: The left ventricle has severely reduced systolic  function, with an ejection fraction of 20-30%. The cavity size was  severely dilated.    Neuro/Psych  Neuromuscular disease negative psych ROS   GI/Hepatic negative GI ROS, Neg liver ROS,   Endo/Other  negative endocrine ROSdiabetes, Type 2, Oral Hypoglycemic Agents  Renal/GU CRFRenal disease  negative genitourinary   Musculoskeletal   Abdominal   Peds  Hematology  (+) Blood dyscrasia, anemia ,   Anesthesia Other Findings   Reproductive/Obstetrics negative OB ROS                          Anesthesia Physical Anesthesia Plan  ASA: 4  Anesthesia Plan: General and Regional   Post-op Pain Management:    Induction: Intravenous  PONV Risk Score and Plan: 2  Airway Management Planned: LMA  Additional Equipment:   Intra-op Plan:   Post-operative Plan: Extubation in OR  Informed Consent: I have reviewed the patients History and Physical, chart, labs and discussed the procedure including the risks, benefits and alternatives for the proposed anesthesia with the patient or authorized representative who has indicated his/her understanding and acceptance.     Dental Advisory  Given  Plan Discussed with: CRNA and Anesthesiologist  Anesthesia Plan Comments: ( )        Anesthesia Quick Evaluation

## 2021-07-30 NOTE — Progress Notes (Signed)
TOC awaiting PT/OT recommendations.  Pt noted to be uninsured with medicaid pending.  Financial counseling asked to review pt insurance status. Daleen Squibb, MSW, LCSW 1/4/20233:59 PM

## 2021-07-30 NOTE — Anesthesia Postprocedure Evaluation (Signed)
Anesthesia Post Note  Patient: Adam James  Procedure(s) Performed: LEFT BELOW KNEE AMPUTATION (Left: Knee) APPLICATION OF WOUND VAC     Patient location during evaluation: PACU Anesthesia Type: Regional Level of consciousness: awake and alert Pain management: pain level controlled Vital Signs Assessment: post-procedure vital signs reviewed and stable Respiratory status: spontaneous breathing, nonlabored ventilation, respiratory function stable and patient connected to nasal cannula oxygen Cardiovascular status: blood pressure returned to baseline and stable Postop Assessment: no apparent nausea or vomiting Anesthetic complications: no   No notable events documented.  Last Vitals:  Vitals:   07/30/21 1500 07/30/21 1532  BP: 111/68 113/76  Pulse: 81 86  Resp: 14 17  Temp: 36.4 C (!) 36.3 C  SpO2: 95% 97%    Last Pain:  Vitals:   07/30/21 1532  TempSrc: Oral  PainSc:                  Gabrielle Mester

## 2021-07-30 NOTE — Progress Notes (Signed)
Orthopedic Tech Progress Note Patient Details:  Adam James May 26, 1971 [id number]  Called in order to [employer] for an AMPUSHIELD BK with [facility]  Patient ID: Adam James, male   DOB: 08-13-1970, 51 y.o.   MRN: [id number]  [name] 07/30/2021, 1:24 PM

## 2021-07-30 NOTE — Anesthesia Procedure Notes (Signed)
Procedure Name: LMA Insertion Date/Time: 07/30/2021 1:16 PM Performed by: Macie Burows, CRNA Pre-anesthesia Checklist: Patient identified, Emergency Drugs available, Suction available and Patient being monitored Patient Re-evaluated:Patient Re-evaluated prior to induction Oxygen Delivery Method: Circle system utilized Preoxygenation: Pre-oxygenation with 100% oxygen Induction Type: IV induction Ventilation: Mask ventilation without difficulty LMA: LMA inserted LMA Size: 4.0 Number of attempts: 1 Placement Confirmation: positive ETCO2 and breath sounds checked- equal and bilateral Tube secured with: Tape Dental Injury: Teeth and Oropharynx as per pre-operative assessment

## 2021-07-30 NOTE — Anesthesia Procedure Notes (Signed)
Anesthesia Regional Block: Popliteal block   Pre-Anesthetic Checklist: , timeout performed,  Correct Patient, Correct Site, Correct Laterality,  Correct Procedure, Correct Position, site marked,  Risks and benefits discussed,  Surgical consent,  Pre-op evaluation,  At surgeon's request and post-op pain management  Laterality: Left  Prep: chloraprep       Needles:  Injection technique: Single-shot  Needle Type: Echogenic Stimulator Needle     Needle Length: 5cm  Needle Gauge: 22     Additional Needles:   Procedures:, nerve stimulator,,, ultrasound used (permanent image in chart),,     Nerve Stimulator or Paresthesia:  Response: foot, 0.45 mA  Additional Responses:   Narrative:  Start time: 07/30/2021 11:15 AM End time: 07/30/2021 12:00 PM Injection made incrementally with aspirations every 5 mL.  Performed by: Personally  Anesthesiologist: Bethena Midget, MD  Additional Notes: Functioning IV was confirmed and monitors were applied.  A 105mm 22ga Arrow echogenic stimulator needle was used. Sterile prep and drape,hand hygiene and sterile gloves were used. Ultrasound guidance: relevant anatomy identified, needle position confirmed, local anesthetic spread visualized around nerve(s)., vascular puncture avoided.  Image printed for medical record. Negative aspiration and negative test dose prior to incremental administration of local anesthetic. The patient tolerated the procedure well.

## 2021-07-30 NOTE — Op Note (Signed)
° °  Date of Surgery: 07/30/2021  INDICATIONS: [name] is a 51 y.o.-year-old male who has undergone foot salvage intervention for the left foot.  Patient has had progressive wound dehiscence and an unstable ankle now with exposed tibial talar joint with a nonplantigrade foot.  He has failed conservative therapy and presents at this time for transtibial amputation.Marland Kitchen  PREOPERATIVE DIAGNOSIS: Osteomyelitis abscess and unstable left foot  POSTOPERATIVE DIAGNOSIS: Same.  PROCEDURE: Transtibial amputation Application of Prevena wound VAC  SURGEON: Lajoyce Corners, M.D.  ANESTHESIA:  general  IV FLUIDS AND URINE: See anesthesia records.  ESTIMATED BLOOD LOSS: See anesthesia records.  COMPLICATIONS: None.  DESCRIPTION OF PROCEDURE: The patient was brought to the operating room after undergoing regional anesthetic. After adequate levels of anesthesia were obtained patient's lower extremity was prepped using DuraPrep draped into a sterile field. A timeout was called. The foot was draped out of the sterile field with impervious stockinette. A transverse incision was made 11 cm distal to the tibial tubercle. This curved proximally and a large posterior flap was created. The tibia was transected 1 cm proximal to the skin incision. The fibula was transected just proximal to the tibial incision. The tibia was beveled anteriorly. A large posterior flap was created. The sciatic nerve was pulled cut and allowed to retract. The vascular bundles were suture ligated with 2-0 silk. The deep and superficial fascial layers were closed using #1 Vicryl. The skin was closed using staples and 2-0 nylon. The wound was covered with a Prevena customizable and arthroform wound VAC.  The dressing was sealed with dermatac there was a good suction fit. A prosthetic shrinker and limb protector were applied. Patient was taken to the PACU in stable condition.   DISCHARGE PLANNING:  Antibiotic duration: 24 hours  Weightbearing:  Nonweightbearing on the operative extremity  Pain medication: Opioid pathway  Dressing care/ Wound VAC: Continue wound VAC for 1 week after discharge  Discharge to: Discharge planning based on therapy's recommendations for possible inpatient rehabilitation, outpatient rehabilitation, or discharge to home with therapy  Follow-up: In the office 1 week post operative.  Aldean Baker, MD [facility] 1:51 PM

## 2021-07-30 NOTE — Transfer of Care (Signed)
Immediate Anesthesia Transfer of Care Note  Patient: Adam James  Procedure(s) Performed: LEFT BELOW KNEE AMPUTATION (Left: Knee) APPLICATION OF WOUND VAC  Patient Location: PACU  Anesthesia Type:General  Level of Consciousness: awake, alert  and oriented  Airway & Oxygen Therapy: Patient Spontanous Breathing and Patient connected to nasal cannula oxygen  Post-op Assessment: Report given to RN and Post -op Vital signs reviewed and stable  Post vital signs: Reviewed and stable  Last Vitals:  Vitals Value Taken Time  BP 108/68 07/30/21 1430  Temp 36.1 C 07/30/21 1345  Pulse 83 07/30/21 1439  Resp 14 07/30/21 1439  SpO2 95 % 07/30/21 1439  Vitals shown include unvalidated device data.  Last Pain:  Vitals:   07/30/21 1413  TempSrc:   PainSc: 4       Patients Stated Pain Goal: 0 (07/30/21 1042)  Complications: No notable events documented.

## 2021-07-30 NOTE — Anesthesia Procedure Notes (Signed)
Anesthesia Regional Block: Adductor canal block   Pre-Anesthetic Checklist: , timeout performed,  Correct Patient, Correct Site, Correct Laterality,  Correct Procedure, Correct Position, site marked,  Risks and benefits discussed,  Surgical consent,  Pre-op evaluation,  At surgeon's request and post-op pain management  Laterality: Left  Prep: chloraprep       Needles:  Injection technique: Single-shot  Needle Type: Echogenic Stimulator Needle     Needle Length: 5cm  Needle Gauge: 22     Additional Needles:   Procedures:, nerve stimulator,,, ultrasound used (permanent image in chart),,    Narrative:  Start time: 07/30/2021 12:35 PM End time: 07/30/2021 11:32 AM Injection made incrementally with aspirations every 5 mL.  Performed by: Personally   Additional Notes: Functioning IV was confirmed and monitors were applied.  A 58mm 22ga Arrow echogenic stimulator needle was used. Sterile prep and drape,hand hygiene and sterile gloves were used. Ultrasound guidance: relevant anatomy identified, needle position confirmed, local anesthetic spread visualized around nerve(s)., vascular puncture avoided.  Image printed for medical record. Negative aspiration and negative test dose prior to incremental administration of local anesthetic. The patient tolerated the procedure well.   NO PIK

## 2021-07-31 ENCOUNTER — Telehealth (HOSPITAL_COMMUNITY): Payer: Self-pay | Admitting: Family Medicine

## 2021-07-31 ENCOUNTER — Encounter (HOSPITAL_COMMUNITY): Payer: Self-pay | Admitting: Orthopedic Surgery

## 2021-07-31 LAB — BASIC METABOLIC PANEL
Anion gap: 10 (ref 5–15)
BUN: 51 mg/dL — ABNORMAL HIGH (ref 6–20)
CO2: 21 mmol/L — ABNORMAL LOW (ref 22–32)
Calcium: 7.5 mg/dL — ABNORMAL LOW (ref 8.9–10.3)
Chloride: 97 mmol/L — ABNORMAL LOW (ref 98–111)
Creatinine, Ser: 2.45 mg/dL — ABNORMAL HIGH (ref 0.61–1.24)
GFR, Estimated: 31 mL/min — ABNORMAL LOW (ref >60)
Glucose, Bld: 328 mg/dL — ABNORMAL HIGH (ref 70–99)
Potassium: 5 mmol/L (ref 3.5–5.1)
Sodium: 128 mmol/L — ABNORMAL LOW (ref 135–145)

## 2021-07-31 LAB — CBC
HCT: 24.4 % — ABNORMAL LOW (ref 39.0–52.0)
Hemoglobin: 7.8 g/dL — ABNORMAL LOW (ref 13.0–17.0)
MCH: 26.6 pg (ref 26.0–34.0)
MCHC: 32 g/dL (ref 30.0–36.0)
MCV: 83.3 fL (ref 80.0–100.0)
Platelets: 264 10*3/uL (ref 150–400)
RBC: 2.93 MIL/uL — ABNORMAL LOW (ref 4.22–5.81)
RDW: 13.8 % (ref 11.5–15.5)
WBC: 9.1 10*3/uL (ref 4.0–10.5)
nRBC: 0 % (ref 0.0–0.2)

## 2021-07-31 LAB — GLUCOSE, CAPILLARY
Glucose-Capillary: 317 mg/dL — ABNORMAL HIGH (ref 70–99)
Glucose-Capillary: 284 mg/dL — ABNORMAL HIGH (ref 70–99)
Glucose-Capillary: 220 mg/dL — ABNORMAL HIGH (ref 70–99)
Glucose-Capillary: 188 mg/dL — ABNORMAL HIGH (ref 70–99)
Glucose-Capillary: 222 mg/dL — ABNORMAL HIGH (ref 70–99)

## 2021-07-31 MED ORDER — INSULIN GLARGINE-YFGN 100 UNIT/ML ~~LOC~~ SOLN
12.0000 [IU] | Freq: Every day | SUBCUTANEOUS | Status: DC
  Administered 2021-07-31 – 2021-08-05 (×6): 12 [IU] via SUBCUTANEOUS
  Filled 2021-07-31 (×6): qty 0.12

## 2021-07-31 NOTE — Plan of Care (Signed)

## 2021-07-31 NOTE — TOC Progression Note (Signed)
Transition of Care [facility]) - Progression Note    Patient Details  Name: Jaison Petraglia MRN: [id number] Date of Birth: [birth year]  [facility] CM/SW Contact  Epifanio Lesches, RN Phone Number: 07/31/2021, 4:29 PM  Clinical Narrative:    PT evaluation pending...pt refused initial session with PT. CIR following for potential admit. Referral made with [facility] (charity care) as a backup if d/c to home. CSW monitoring for SNF need. TOC team will continue to monitor and assist with needs.   Expected Discharge Plan: IP Rehab Facility Barriers to Discharge: Continued Medical Work up  Expected Discharge Plan and Services Expected Discharge Plan: IP Rehab Facility In-house Referral: [facility] (Medicaid pending, need bank statements submitted  to DSS per [facility]) Discharge Planning Services: CM Consult                               HH Arranged: PT [facility] Agency: [facility] Date [facility] Agency Contacted: 07/31/21 Time HH Agency Contacted: [phone] Representative spoke with at [facility] Agency: referral made via voice message , message left with Marylene Land Croon,[phone] for charity care HHPT   Social Determinants of Health (SDOH) Interventions    Readmission Risk Interventions No flowsheet data found.

## 2021-07-31 NOTE — Progress Notes (Signed)
Inpatient Rehabilitation Admissions Coordinator   I have received rehab consult. I await therapy evaluations to assist with planning rehab venue options.  Ottie Glazier, RN, MSN Rehab Admissions Coordinator [phone] 07/31/2021 8:13 AM

## 2021-07-31 NOTE — Progress Notes (Signed)
Patient ID: Adam James, male   DOB: 19-Nov-1970, 51 y.o.   MRN: [id number] Patient is a 51 year old gentleman who is postoperative day 1 left transtibial amputation.  Patient has no complaints.  The wound VAC has a good suction fit no drainage.  Plan for physical therapy today, inpatient versus outpatient rehab based on therapy recommendations.

## 2021-07-31 NOTE — Progress Notes (Signed)
Inpatient Rehabilitation Admissions Coordinator   I met at bedside with patient for rehab assessment. I discussed goals and expectations of a possible CIR admit. I also gave him an Estimate of Cost of Care for a possible Cir admit. He has applied for disability March 3/22 and he states he has neither been denied or approved. He states he can not afford CIR and Home health can not go to his home. He would like to discuss his DME needs and today prefers to go home. I will follow up with him tomorrow to clarify if he has changed his preference for CIR admit.  Danne Baxter, RN, MSN Rehab Admissions Coordinator [phone] 07/31/2021 4:30 PM

## 2021-07-31 NOTE — Progress Notes (Addendum)
Inpatient Diabetes Program Recommendations  AACE/ADA: New Consensus Statement on Inpatient Glycemic Control (2015)  Target Ranges:  Prepandial:   less than 140 mg/dL      Peak postprandial:   less than 180 mg/dL (1-2 hours)      Critically ill patients:  140 - 180 mg/dL   Lab Results  Component Value Date   GLUCAP 220 (H) 07/31/2021   HGBA1C 7.9 (H) 07/30/2021    Review of Glycemic Control  Latest Reference Range & Units 07/30/21 16:47 07/30/21 20:42 07/31/21 08:07 07/31/21 10:05 07/31/21 12:23  Glucose-Capillary 70 - 99 mg/dL 186 (H) 266 (H) 317 (H) 284 (H) 220 (H)   Diabetes history: DM 2 Outpatient Diabetes medications:  Glyburide 5 mg bid, Metformin 500 mg bid Current orders for Inpatient glycemic control:  Novolog moderate tid with meals  Novolog 4 units tid with meals Glyburide 5 mg bid, Metformin 500 mg bid Inpatient Diabetes Program Recommendations:    While in the hospital, consider holding oral DM agents.  Blood sugars increased today (patient received Decadron 10 mg on 07/30/21 prior to surgery).   Consider adding Semglee 12 units daily while patient is in the hospital.  Likely will not need insulin at home based on A1C, but may need adjustments in home oral medications.   Thanks,  Adah Perl, RN, BC-ADM Inpatient Diabetes Coordinator Pager [phone]  (8a-5p)

## 2021-07-31 NOTE — Telephone Encounter (Signed)
Pt called to notify clinic staff that he is currently a patient here at the hospital, please advise

## 2021-07-31 NOTE — Progress Notes (Signed)
PT Cancellation Note  Patient Details Name: Adam James MRN: [id number] DOB: Jul 15, 1971   Cancelled Treatment:    Reason Eval/Treat Not Completed: Other (comment) (Pt. refuses therapy at this time.  States he "just did that with OT".  PT explains the difference in therapist roles but pt. states that "therapy is therapy and he already did that today".)  Pt is agitated and PT takes inc time in pt room to assist getting him the things he needs, explaining importance of therapy for rehab placement, etc.  Pt is agreeable to work with PT tomorrow.  It would be best if following PT does not re-ask any PLOF questions as pt becomes annoyed with being asked more than once (by OT and then PT).  Info gathered is as follows: lives in mobile home with daughter, daughter not available to help.  Usually independent, 6 steps to enter mobile home with rails.  Jhada Risk A. Arnette Driggs, PT, DPT Acute Rehabilitation Services Office: [place] 07/31/2021, 12:24 PM

## 2021-07-31 NOTE — Evaluation (Signed)
Occupational Therapy Evaluation Patient Details Name: Adam James MRN: [id number] DOB: Jun 11, 1971 Today's Date: 07/31/2021   History of Present Illness Pt is a 51 y/o M admitted for progressive ulceration of lateral ankle, reporting instability with ambulation. 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 PMH includes CABG (04/2021), CAD, CHF, CKD, HTN, DM2, and anemia.   Clinical Impression   Pt reports independence at baseline with ADLs and mobility, lives with daughter who is unable to assist at d/c. Pt currently min-mod A for ADLs, supervision for bed mobility and min guard-min A for sit to stand transfer x2. Pt anxious during session and hesitant to attempt transfers/mobility, requires mod encouragement. Pt reports increased 8/10 fatigue after completing transfers and shuffling R foot to move L towards HOB. Educated pt on limb desensitization strategies for pain management, pt verbalizes understanding but appears preoccupied and may need reinforcement. Pt presenting with impairments listed below, will continue to follow acutely. Recommend AIR/CIR at d/c.      Recommendations for follow up therapy are one component of a multi-disciplinary discharge planning process, led by the attending physician.  Recommendations may be updated based on patient status, additional functional criteria and insurance authorization.   Follow Up Recommendations  Acute inpatient rehab (3hours/day)    Assistance Recommended at Discharge Intermittent Supervision/Assistance  Patient can return home with the following Two people to help with walking and/or transfers;A lot of help with walking and/or transfers;A lot of help with bathing/dressing/bathroom;Assistance with cooking/housework;Assist for transportation;Help with stairs or ramp for entrance    Functional Status Assessment  Patient has had a recent decline in their functional status and demonstrates the ability to make significant improvements in  function in a reasonable and predictable amount of time.  Equipment Recommendations  Other (comment) (Defer to next venue of care)    Recommendations for Other Services PT consult;Rehab consult     Precautions / Restrictions Precautions Precautions: Fall Required Braces or Orthoses: Other Brace (L) Other Brace: Limb protector Restrictions Weight Bearing Restrictions: Yes LLE Weight Bearing: Non weight bearing      Mobility Bed Mobility Overal bed mobility: Needs Assistance Bed Mobility: Supine to Sit;Sit to Supine     Supine to sit: Supervision;HOB elevated Sit to supine: Supervision;HOB elevated        Transfers Overall transfer level: Needs assistance Equipment used: Rolling walker (2 wheels) Transfers: Sit to/from Stand Sit to Stand: Min guard;Min assist           General transfer comment: pt declining chair transfer at this time, able to perform sit to stand transfer x2, shuffled R foot to move ~73ft toward [facility]      Balance Overall balance assessment: Needs assistance Sitting-balance support: Feet supported;No upper extremity supported Sitting balance-[name] Scale: Good     Standing balance support: During functional activity;Reliant on assistive device for balance Standing balance-[name] Scale: Poor                             ADL either performed or assessed with clinical judgement   ADL Overall ADL's : Needs assistance/impaired Eating/Feeding: Independent;Sitting   Grooming: Set up;Sitting   Upper Body Bathing: Minimal assistance;Sitting   Lower Body Bathing: Minimal assistance;Sitting/lateral leans   Upper Body Dressing : Set up;Sitting Upper Body Dressing Details (indicate cue type and reason): dons gown around backside sitting EOB Lower Body Dressing: Moderate assistance;Sitting/lateral leans;Sit to/from stand   Toilet Transfer: Moderate assistance;Stand-pivot;Rolling walker (2 wheels);BSC/3in1  Toileting- Clothing Manipulation  and Hygiene: Moderate assistance;Sit to/from stand       Functional mobility during ADLs: Minimal assistance;Rolling walker (2 wheels);Cueing for safety       Vision Baseline Vision/History: 1 Wears glasses Vision Assessment?: No apparent visual deficits     Perception     Praxis      Pertinent Vitals/Pain Pain Assessment: Faces Pain Score: 2  Faces Pain Scale: Hurts a little bit Pain Location: LLE Pain Descriptors / Indicators: Discomfort;Grimacing;Guarding Pain Intervention(s): Limited activity within patient's tolerance;Monitored during session;Premedicated before session;Repositioned     Hand Dominance Right   Extremity/Trunk Assessment Upper Extremity Assessment Upper Extremity Assessment: Overall WFL for tasks assessed   Lower Extremity Assessment Lower Extremity Assessment: Defer to PT evaluation   Cervical / Trunk Assessment Cervical / Trunk Assessment: Normal   Communication Communication Communication: No difficulties   Cognition Arousal/Alertness: Awake/alert Behavior During Therapy: Restless;Agitated;Anxious;WFL for tasks assessed/performed Overall Cognitive Status: Within Functional Limits for tasks assessed                                 General Comments: pt anxious that he feels sweaty/clammy waiting for NT to take blood sugar     General Comments  Pt reporting 8/10 fatigue after 2 transfers and short side shuffling with RW, slight pain in LLE with transfer. Educated pt on limb desensitization techniques for pain reduction, however appeared preoccupied and may need reinforcement.    Exercises     Shoulder Instructions      Home Living Family/patient expects to be discharged to:: Private residence Living Arrangements: Children Available Help at Discharge: Other (Comment) (daughter works night shift and sleeps all day per pt, unavailable to help at d/c.) Type of Home: Mobile home Home Access: Stairs to enter [employer] of Steps: 5-6 Entrance Stairs-Rails: Right;Left Home Layout: One level     Bathroom Shower/Tub: IT trainer: Standard         Additional Comments: Lives with 63 y.o. daughter who works night shift      Prior Functioning/Environment Prior Level of Function : Independent/Modified Independent               ADLs Comments: does IADLs, reports taking care of daughter's dogs        OT Problem List: Decreased strength;Decreased range of motion;Decreased activity tolerance;Impaired balance (sitting and/or standing);Decreased safety awareness;Decreased knowledge of use of DME or AE;Decreased knowledge of precautions;Cardiopulmonary status limiting activity      OT Treatment/Interventions: Self-care/ADL training;Therapeutic exercise;DME and/or AE instruction;Energy conservation;Therapeutic activities;Patient/family education;Balance training    OT Goals(Current goals can be found in the care plan section) Acute Rehab OT Goals Patient Stated Goal: none stated OT Goal Formulation: With patient Time For Goal Achievement: 08/14/21 Potential to Achieve Goals: Good ADL Goals Pt Will Perform Lower Body Dressing: with min assist;sit to/from stand Pt Will Transfer to Toilet: with supervision;regular height toilet;ambulating Pt Will Perform Tub/Shower Transfer: with supervision;ambulating;rolling walker  OT Frequency: Min 3X/week    Co-evaluation              AM-PAC OT "6 Clicks" Daily Activity     Outcome Measure Help from another person eating meals?: None Help from another person taking care of personal grooming?: A Little Help from another person toileting, which includes using toliet, bedpan, or urinal?: A Lot Help from another person bathing (including washing, rinsing, drying)?: A Lot Help from another  person to put on and taking off regular upper body clothing?: A Little Help from another person to put on and taking off regular  lower body clothing?: A Lot 6 Click Score: 16   End of Session Equipment Utilized During Treatment: Gait belt;Rolling walker (2 wheels) Nurse Communication: Mobility status  Activity Tolerance: Patient limited by fatigue Patient left: in bed;with call bell/phone within reach;with bed alarm set  OT Visit Diagnosis: Unsteadiness on feet (R26.81);Other abnormalities of gait and mobility (R26.89);Muscle weakness (generalized) (M62.81)                Time: [id number] OT Time Calculation (min): 32 min Charges:  OT General Charges $OT Visit: 1 Visit OT Evaluation $OT Eval Low Complexity: 1 Low OT Treatments $Self Care/Home Management : 8-22 mins  Alfonzo BeersNatalie Xue Low, OTD, OTR/L Acute Rehab [phone]   Mayer Maskeratalie M Manny Vitolo 07/31/2021, 1:41 PM

## 2021-07-31 NOTE — Plan of Care (Signed)

## 2021-07-31 NOTE — Progress Notes (Signed)
Patient's pain has been controlled by the use of elevation and prn pain meds. Pt is still struggling to come to terms with his amputation, but has been cooperative and involved in his care. Please note that pt likes to be informed on everything that is going on regarding his care. Upon assessing pt's IV site, it was noted that the IV catheter was partially out and had a kink in it. Pt stated that I should not take it out, although he was informed that the IV was essentially useless. I was asked to leave it in because he is a "difficult stick". Pt's request was met and his IV fluids were stopped since she was his oral intake was good. Pt has been informed of the possible need to start a new IV if needed.

## 2021-08-01 LAB — BASIC METABOLIC PANEL
Anion gap: 7 (ref 5–15)
BUN: 61 mg/dL — ABNORMAL HIGH (ref 6–20)
CO2: 25 mmol/L (ref 22–32)
Calcium: 7.3 mg/dL — ABNORMAL LOW (ref 8.9–10.3)
Chloride: 92 mmol/L — ABNORMAL LOW (ref 98–111)
Creatinine, Ser: 2.2 mg/dL — ABNORMAL HIGH (ref 0.61–1.24)
GFR, Estimated: 36 mL/min — ABNORMAL LOW (ref >60)
Glucose, Bld: 213 mg/dL — ABNORMAL HIGH (ref 70–99)
Potassium: 4.2 mmol/L (ref 3.5–5.1)
Sodium: 124 mmol/L — ABNORMAL LOW (ref 135–145)

## 2021-08-01 LAB — SURGICAL PATHOLOGY

## 2021-08-01 LAB — GLUCOSE, CAPILLARY
Glucose-Capillary: 197 mg/dL — ABNORMAL HIGH (ref 70–99)
Glucose-Capillary: 180 mg/dL — ABNORMAL HIGH (ref 70–99)
Glucose-Capillary: 143 mg/dL — ABNORMAL HIGH (ref 70–99)
Glucose-Capillary: 118 mg/dL — ABNORMAL HIGH (ref 70–99)

## 2021-08-01 LAB — CBC
HCT: 22.4 % — ABNORMAL LOW (ref 39.0–52.0)
Hemoglobin: 7.5 g/dL — ABNORMAL LOW (ref 13.0–17.0)
MCH: 27.3 pg (ref 26.0–34.0)
MCHC: 33.5 g/dL (ref 30.0–36.0)
MCV: 81.5 fL (ref 80.0–100.0)
Platelets: 299 10*3/uL (ref 150–400)
RBC: 2.75 MIL/uL — ABNORMAL LOW (ref 4.22–5.81)
RDW: 13.7 % (ref 11.5–15.5)
WBC: 13.3 10*3/uL — ABNORMAL HIGH (ref 4.0–10.5)
nRBC: 0 % (ref 0.0–0.2)

## 2021-08-01 NOTE — Evaluation (Signed)
Physical Therapy Evaluation Patient Details Name: Adam James MRN: [id number] DOB: [birth year] Today's Date: 08/01/2021  History of Present Illness  Pt. is 51 yr old M admitted on 07/30/21.  Pt. underwent L 5th ray amp in Oct 22, but has had progressive deformity of ankle with difficulty amb resulting in need for L BKA.  Underwent sx on day of admission. PMH: CHF with EF of 25%, DM II, MI, OM, L 5th ray amputation, CABG, CKD, HTN  Clinical Impression  Pt is highly agitated.  Does not want PT's help with activity, but PT encourages pt to let her help him to the [facility] so she can educate on proper transfer technique.  Pt is tyipically independent with functional mobility, but is currently requiring min A and use of RW to stand from elevated height and pivot over to [facility].  Pt demos dec L LE strength/ROM, transfer difficulty, dec safety awareness, dec balance and gait deviations and would benefit from skilled PT to address deficits indicated in initial eval.  Pt has refused CIR due to cost and is interested in going home.  In order to safely return home, pt will need min A for transfers and be able to negotiate 6 steps into home (either with physical assist/handrails or use of w/c transfer technique).  PT to continue to work on stair training/education with patient.     Recommendations for follow up therapy are one component of a multi-disciplinary discharge planning process, led by the attending physician.  Recommendations may be updated based on patient status, additional functional criteria and insurance authorization.  Follow Up Recommendations Home health PT (CIR was recommended but pt. refuses.  Wants to return home.  In order to safely return home will need to be able to negotiate 6 steps or have assist to do w/c transfer into home.)    Assistance Recommended at Discharge Intermittent Supervision/Assistance  Patient can return home with the following  A little help with walking and/or transfers;A  little help with bathing/dressing/bathroom;Assistance with cooking/housework;Assist for transportation;Help with stairs or ramp for entrance    Equipment Recommendations Rolling walker (2 wheels);Wheelchair (measurements PT)  Recommendations for Other Services       Functional Status Assessment Patient has had a recent decline in their functional status and demonstrates the ability to make significant improvements in function in a reasonable and predictable amount of time.     Precautions / Restrictions Precautions Precautions: Fall Restrictions Weight Bearing Restrictions: Yes LLE Weight Bearing: Non weight bearing      Mobility  Bed Mobility Overal bed mobility: Independent Bed Mobility: Supine to Sit     Supine to sit: Independent     General bed mobility comments: Transfers to EOB independently.  Demos good sitting balance at EOB. Patient Response: Cooperative  Transfers Overall transfer level: Needs assistance Equipment used: Rolling walker (2 wheels) Transfers: Sit to/from Stand;Bed to chair/wheelchair/BSC Sit to Stand: Min assist Stand pivot transfers: Min guard         General transfer comment: Prior to transfer, PT demonstrates pivoting on R foot as pt is reluctant to attempt any hopping.  Pt. requires min A for sit > stand from elevated height of bed, primarily for PT to stabilize RW as pt. does not feel comfortable pushing up with both hands on bed and leaves 1 hand on RW during transfer. Pt. able to pivot on R LE to transfer to [facility] with CGA and PT managing wound vac line. Pt. asks that PT leave so that he  can have privacy to use the commode.  Is left with call bell in reach.    Ambulation/Gait                  Stairs            Wheelchair Mobility    Modified Rankin (Stroke Patients Only)       Balance Overall balance assessment: Needs assistance   Sitting balance-[name] Scale: Good     Standing balance support: Bilateral upper  extremity supported;During functional activity Standing balance-[name] Scale: Poor                               Pertinent Vitals/Pain Pain Assessment: 0-10 Pain Score: 8  Pain Location: L LE Pain Descriptors / Indicators: Aching;Throbbing;Discomfort Pain Intervention(s): Limited activity within patient's tolerance;Premedicated before session    [place] expects to be discharged to:: Private residence Living Arrangements: Children Available Help at Discharge:  (Lives with daughter but states she will not help him during his recovery) Type of Home: Mobile home Home Access: Stairs to enter Entrance Stairs-Rails: Can reach both Entrance Stairs-Number of Steps: 6   Home Layout: One level        Prior Function Prior Level of Function : Independent/Modified Independent                     Hand Dominance        Extremity/Trunk Assessment   Upper Extremity Assessment Upper Extremity Assessment: Defer to OT evaluation    Lower Extremity Assessment Lower Extremity Assessment: LLE deficits/detail LLE Deficits / Details: Grossly 2-/5 LLE: Unable to fully assess due to pain       Communication   Communication: No difficulties  Cognition Arousal/Alertness: Awake/alert Behavior During Therapy: Agitated Overall Cognitive Status: Within Functional Limits for tasks assessed                                 General Comments: Pt supine in bed when PT arrives.  States he wants pain meds prior to eval, NSG notified.  PT returns 1 hr later and pt. states he got meds but threw them all up.  Wants to get to [facility].  Reluctant to let PT assist.        General Comments General comments (skin integrity, edema, etc.): Dr. Kipp Brood has verbally cleared pt for [facility] through [facility] for functional mobility.  Pt. is informed of Dr's clearance.    Exercises     Assessment/Plan    PT Assessment Patient needs continued PT services  PT Problem  List Decreased mobility;Decreased strength;Decreased safety awareness;Decreased range of motion;Decreased activity tolerance;Pain;Decreased knowledge of use of DME;Decreased balance       PT Treatment Interventions DME instruction;Therapeutic exercise;Gait training;Balance training;Wheelchair mobility training;Stair training;Functional mobility training;Therapeutic activities;Patient/family education    PT Goals (Current goals can be found in the Care Plan section)  Acute Rehab PT Goals Patient Stated Goal: Pt's goal is to decrease pain. PT Goal Formulation: With patient Time For Goal Achievement: 08/15/21 Potential to Achieve Goals: Good    Frequency Min 3X/week     Co-evaluation               AM-PAC PT "6 Clicks" Mobility  Outcome Measure Help needed turning from your back to your side while in a flat bed without using bedrails?: None Help needed moving from  lying on your back to sitting on the side of a flat bed without using bedrails?: None Help needed moving to and from a bed to a chair (including a wheelchair)?: A Little Help needed standing up from a chair using your arms (e.g., wheelchair or bedside chair)?: A Little Help needed to walk in hospital room?: A Little Help needed climbing 3-5 steps with a railing? : A Lot 6 Click Score: 19    End of Session Equipment Utilized During Treatment: Gait belt Activity Tolerance: Patient tolerated treatment well Patient left: Other (comment) (On BSC, NT aware, call bell within reach)   PT Visit Diagnosis: Unsteadiness on feet (R26.81);Difficulty in walking, not elsewhere classified (R26.2);Pain Pain - Right/Left: Left Pain - part of body: Leg    Time: [id number] PT Time Calculation (min) (ACUTE ONLY): 20 min   Charges:   PT Evaluation $PT Eval Low Complexity: 1 Low          Kimmi Acocella A. Maridee Slape, PT, DPT Acute Rehabilitation Services Office: [place] 08/01/2021, 10:42 AM

## 2021-08-01 NOTE — Progress Notes (Signed)
Inpatient Rehabilitation Admissions Coordinator   I met at bedside with patient to follow up on rehab discussions from yesterday. He is not in agreement to CIR admit due to cost estimate. He would like to discharge home in a few days and requests DME . I will alert acute team and TOC. We will sign off at this time.  Danne Baxter, RN, MSN Rehab Admissions Coordinator [phone] 08/01/2021 11:09 AM

## 2021-08-01 NOTE — TOC Initial Note (Signed)
Transition of Care [facility]) - Initial/Assessment Note    Patient Details  Name: Adam James MRN: [id number] Date of Birth: 1971/01/10  Transition of [facility]) CM/SW Contact:    Joanne Chars, LCSW Phone Number: 08/01/2021, 2:40 PM  Clinical Narrative:    CSW met with pt regarding discharge plan.  Pt confirms that he cannot afford CIR, discussed that there are similar payment issues with SNF.  Pt lives at home with adult daughter, agreeable to [facility].  Discussed that it will be limited sessions due to no payer.  Discussed DME.  Pt has walker and wheelchair currently, pt says wheelchair is much to big, would like smaller one.  CSW spoke with Freda Munro at [facility], they did provide bariatric wheelchair in 9/22.  At current weight, pt would be appropriate for regular size wheelchair and they can make this exchange when pt gets home.  CSW informed pt and placed phone number on AVS.                  Expected Discharge Plan: [place] Barriers to Discharge: Continued Medical Work up   Patient Goals and CMS Choice     Choice offered to / list presented to : Patient  Expected Discharge Plan and Services Expected Discharge Plan: [place] In-house Referral: Development worker, community (Medicaid pending, need bank statements submitted  to DSS per [employer]) Discharge Planning Services: CM Consult Post Acute Care Choice: [place] arrangements for the past 2 months: Single Family Home                 DME Arranged: Wheelchair manual (Pt was given a bariatric wheelchair in 8/22.  Freda Munro at [facility] said they can exchange for a regular wheelchair due to pt current weight.  Pt informed.) DME Agency: [employer] Date DME Agency Contacted: 08/01/21 Time DME Agency Contacted: [phone] Representative spoke with at DME Agency: Freda Munro HH Arranged: PT [place]: [facility] Date [place]: 07/31/21 Time [place]: [phone] Representative spoke with at  [place]: referral made via voice message , message left with Angela Croon,[phone] for charity care HHPT  Prior Living Arrangements/Services Living arrangements for the past 2 months: Single Family Home Lives with:: Adult Children Patient language and need for interpreter reviewed:: Yes Do you feel safe going back to the place where you live?: Yes      Need for Family Participation in Patient Care: Yes (Comment) Care giver support system in place?: Yes (comment) Current home services: Other (comment) (none) Criminal Activity/Legal Involvement Pertinent to Current Situation/Hospitalization: No - Comment as needed  Activities of Daily Living Home Assistive Devices/Equipment: Eyeglasses, Dentures (specify type), CBG Meter, Other (Comment) (boot) ADL Screening (condition at time of admission) Patient's cognitive ability adequate to safely complete daily activities?: Yes Is the patient deaf or have difficulty hearing?: No Does the patient have difficulty seeing, even when wearing glasses/contacts?: Yes Does the patient have difficulty concentrating, remembering, or making decisions?: No Patient able to express need for assistance with ADLs?: Yes Does the patient have difficulty dressing or bathing?: Yes Independently performs ADLs?: Yes (appropriate for developmental age) Does the patient have difficulty walking or climbing stairs?: Yes Weakness of Legs: None Weakness of Arms/Hands: None  Permission Sought/Granted Permission sought to share information with : Family Supports Permission granted to share information with : Yes, Verbal Permission Granted  Share Information with NAME: daughter [place]  Emotional Assessment Appearance:: Appears stated age Attitude/Demeanor/Rapport: Engaged Affect (typically observed): Appropriate, Pleasant Orientation: : Oriented to Self, Oriented to Place, Oriented to  Time, Oriented to Situation Alcohol / Substance Use: Not  Applicable Psych Involvement: No (comment)  Admission diagnosis:  Below-knee amputation of left lower extremity ([place]) [Q67.619J] Patient Active Problem List   Diagnosis Date Noted   Below-knee amputation of left lower extremity ([place]) 07/30/2021   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   Acute osteomyelitis of left foot ([place])    Acute exacerbation of CHF (congestive heart failure) ([place]) 04/28/2021   Venous stasis ulcer of right calf limited to breakdown of skin without varicose veins (HCC)    Non-pressure chronic ulcer of other part of left foot limited to breakdown of skin (HCC)    Subacute osteomyelitis, left ankle and foot (HCC)    Acute on chronic combined systolic and diastolic CHF (congestive heart failure) (HCC)    Acute on chronic heart failure ([place]) 03/23/2021   NSTEMI (non-ST elevated myocardial infarction) ([place])    Gastroesophageal reflux disease with esophagitis and hemorrhage    Anemia, posthemorrhagic, acute    Acute esophagitis    Gastrointestinal hemorrhage    Necrotizing fasciitis ([place])    Left leg cellulitis    Diabetic polyneuropathy associated with type 2 diabetes mellitus ([place])    Severe protein-calorie malnutrition ([place])    Gangrene of left foot ([place]) 02/25/2021   Type 2 diabetes mellitus (HCC)    Heart failure (HCC)    Dyslipidemia    Diabetic foot ulcer (HCC)    CAD (coronary artery disease)    Cardiomyopathy ([place])    Coronary artery disease involving native coronary artery of native heart with angina pectoris [facility])    PCP:  Pcp, No Pharmacy:   [facility], [address], [place] [address] Phone: [phone] Fax: [phone]     Social Determinants of Health (SDOH) Interventions    Readmission Risk Interventions No flowsheet data found.

## 2021-08-02 LAB — GLUCOSE, CAPILLARY
Glucose-Capillary: 150 mg/dL — ABNORMAL HIGH (ref 70–99)
Glucose-Capillary: 151 mg/dL — ABNORMAL HIGH (ref 70–99)
Glucose-Capillary: 116 mg/dL — ABNORMAL HIGH (ref 70–99)
Glucose-Capillary: 130 mg/dL — ABNORMAL HIGH (ref 70–99)
Glucose-Capillary: 99 mg/dL (ref 70–99)
Glucose-Capillary: 160 mg/dL — ABNORMAL HIGH (ref 70–99)

## 2021-08-02 NOTE — Progress Notes (Signed)
Physical Therapy Treatment Patient Details Name: Adam James MRN: [id number] DOB: 07-Jul-1971 Today's Date: 08/02/2021   History of Present Illness 51 yr old M admitted on 07/30/21.  Pt. underwent L 5th ray amp in Oct 22, but has had progressive deformity of ankle with difficulty amb resulting in need for L BKA.  Underwent sx on day of admission. 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  Vitals did not reveal a reason for feeling this way, but pt states he is not sitting up in his chair at all yet.  Follow along for goals of PT which are currently HHPT due to patient insisting on going home, but pt is not making physical progress to get there.  CIR is not in the works, but may need to plan SNF if he is not going to be up in chair and standing more.  Follow for acute PT goals as are ordered.   Recommendations for follow up therapy are one component of a multi-disciplinary discharge planning process, led by the attending physician.  Recommendations may be updated based on patient status, additional functional criteria and insurance authorization.  Follow Up Recommendations  Home health PT     Assistance Recommended at Discharge Intermittent Supervision/Assistance  Patient can return home with the following A little help with walking and/or transfers;A little help with bathing/dressing/bathroom;Assistance with cooking/housework;Assist for transportation;Help with stairs or ramp for entrance   Equipment Recommendations  Rolling walker (2 wheels);Wheelchair (measurements PT)    Recommendations for Other Services       Precautions / Restrictions Precautions Precautions: Fall Required Braces or Orthoses: Other Brace Other Brace: Limb protector Restrictions Weight Bearing Restrictions: Yes LLE Weight Bearing: Non weight bearing Other  Position/Activity Restrictions: elevate LLE in bed     Mobility  Bed Mobility Overal bed mobility: Modified Independent Bed Mobility: Supine to Sit;Sit to Supine     Supine to sit: Supervision Sit to supine: Supervision   General bed mobility comments: able to assist his LLE to move    Transfers Overall transfer level: Needs assistance Equipment used: 1 person hand held assist Transfers: Bed to chair/wheelchair/BSC Sit to Stand:  (declined over light headed feeling)          Lateral/Scoot Transfers: Min guard General transfer comment: talked with pt about not having walked yet and having stairs to enter house.  He is not seeing the issue, feels he can get up steps fine even if he cant walk.    Ambulation/Gait               General Gait Details: deferred over light headed feeling   Stairs             Wheelchair Mobility    Modified Rankin (Stroke Patients Only)       Balance Overall balance assessment: Needs assistance Sitting-balance support: Feet supported;Bilateral upper extremity supported Sitting balance-[name] Scale: Good         Standing balance comment: pt did not fully stand but scooted up bed with RLE and UE's                            Cognition Arousal/Alertness: Awake/alert Behavior During Therapy: Anxious Overall Cognitive Status: No family/caregiver present to determine baseline cognitive functioning  General Comments: pt is objecting to all his care, feels nothing has gone well.  Received in bed and got to side of bed, pt light headed but BP and sats, pulse were all [facility]        Exercises      General Comments General comments (skin integrity, edema, etc.): pt was able to sit up finally but with complaints of being light headed, BP was 107/69 sitting and sat 99%      Pertinent Vitals/Pain Pain Assessment: Faces Faces Pain Scale: Hurts a little bit Breathing:  normal Negative Vocalization: none Facial Expression: smiling or inexpressive Body Language: relaxed Consolability: no need to console PAINAD Score: 0 Pain Location: L LE Pain Descriptors / Indicators: Guarding Pain Intervention(s): Limited activity within patient's tolerance;Monitored during session;Repositioned    Home Living                          Prior Function            PT Goals (current goals can now be found in the care plan section) Acute Rehab PT Goals Patient Stated Goal: Pt's goal is to decrease pain. Progress towards PT goals: Not progressing toward goals - comment    Frequency    Min 3X/week      PT Plan Current plan remains appropriate    Co-evaluation              AM-PAC PT "6 Clicks" Mobility   Outcome Measure  Help needed turning from your back to your side while in a flat bed without using bedrails?: None Help needed moving from lying on your back to sitting on the side of a flat bed without using bedrails?: None Help needed moving to and from a bed to a chair (including a wheelchair)?: A Little Help needed standing up from a chair using your arms (e.g., wheelchair or bedside chair)?: A Little Help needed to walk in hospital room?: A Little Help needed climbing 3-5 steps with a railing? : Total 6 Click Score: 18    End of Session Equipment Utilized During Treatment: Gait belt Activity Tolerance: Treatment limited secondary to medical complications (Comment) Patient left: in bed;with call bell/phone within reach;with bed alarm set Nurse Communication: Mobility status PT Visit Diagnosis: Unsteadiness on feet (R26.81);Difficulty in walking, not elsewhere classified (R26.2);Pain Pain - Right/Left: Left Pain - part of body: Leg     Time: 1430-1453 PT Time Calculation (min) (ACUTE ONLY): 23 min  Charges:  $Therapeutic Activity: 23-37 mins                 Ramond Dial 08/02/2021, 6:07 PM  Mee Hives, PT PhD Acute Rehab Dept.  Number: [place]

## 2021-08-02 NOTE — Progress Notes (Signed)
Patient ID: Adam James, male   DOB: 1971/01/23, 51 y.o.   MRN: [id number] Patient is status post left transtibial amputation.  There is no drainage in the wound VAC canister and a good suction fit.  Secondary to finances patient will be unable to go to skilled nursing or inpatient rehab.  Again discussed the importance of working with therapy in the hospital for discharge to home.  Patient states that he does not want home health physical therapy coming into his home.  I will reevaluate for discharge to home on Monday.

## 2021-08-03 LAB — GLUCOSE, CAPILLARY
Glucose-Capillary: 146 mg/dL — ABNORMAL HIGH (ref 70–99)
Glucose-Capillary: 162 mg/dL — ABNORMAL HIGH (ref 70–99)
Glucose-Capillary: 156 mg/dL — ABNORMAL HIGH (ref 70–99)
Glucose-Capillary: 194 mg/dL — ABNORMAL HIGH (ref 70–99)
Glucose-Capillary: 188 mg/dL — ABNORMAL HIGH (ref 70–99)

## 2021-08-03 NOTE — Progress Notes (Signed)
Patient is a 51 year old male who is POD 4 s/p left transtibial amputation.  Reports he is doing well and pain is improved compared with yesterday.  He has not worked with physical therapy yet today but his goal is to transfer from the bed to the chair in the room.  Denies any fevers, chills, night sweats.  No chest pain, shortness of breath, abdominal pain.  Vital signs are stable with some elevation of diastolic BP.  No symptoms concerning at this time.  Suction intact with wound VAC sponge.  No drainage in wound VAC canister.  Patient does not appear ill.  Plan is to continue with wound VAC and dressing and reevaluation for potential discharge home by Dr. Lajoyce Corners tomorrow morning.

## 2021-08-04 ENCOUNTER — Inpatient Hospital Stay (HOSPITAL_COMMUNITY): Admission: RE | Admit: 2021-08-04 | Payer: Medicaid Other | Source: Ambulatory Visit

## 2021-08-04 LAB — GLUCOSE, CAPILLARY
Glucose-Capillary: 149 mg/dL — ABNORMAL HIGH (ref 70–99)
Glucose-Capillary: 154 mg/dL — ABNORMAL HIGH (ref 70–99)
Glucose-Capillary: 159 mg/dL — ABNORMAL HIGH (ref 70–99)
Glucose-Capillary: 163 mg/dL — ABNORMAL HIGH (ref 70–99)
Glucose-Capillary: 170 mg/dL — ABNORMAL HIGH (ref 70–99)
Glucose-Capillary: 184 mg/dL — ABNORMAL HIGH (ref 70–99)

## 2021-08-04 NOTE — Plan of Care (Signed)

## 2021-08-04 NOTE — Progress Notes (Signed)
Patient ID: Adam James, male   DOB: 1971/07/15, 51 y.o.   MRN: [id number] Therapy states that patient was unable to participate with therapy on Saturday due to feeling lightheaded.  Patient states that therapy did not come by Sunday.  Plan for physical therapy today.  Possible discharge to home on Tuesday.  Patient states that due to animals at home he would like the wound VAC dressing removed prior to discharge to home.  I discussed the importance of maintaining the sterile dressing while in an environment with multiple potential sources of contamination.

## 2021-08-04 NOTE — Plan of Care (Signed)

## 2021-08-04 NOTE — TOC Progression Note (Addendum)
Transition of Care [facility]) - Progression Note    Patient Details  Name: Tyreak Reagle MRN: [id number] Date of Birth: Jul 14, 1971  [facility] CM/SW Contact  Bess Kinds, RN Phone Number: [phone] 08/04/2021, 8:36 AM  Clinical Narrative:     Noted patient nearing readiness to transition home. Reached out to [place] at [facility] to verify charity acceptance for PT and SW - confirmed. MD - please place HH orders for PT and SW with Face to Face. TOC following for transition needs.   Update 11:15 am: Spoke with representative at [facility] in [place]. They can accept patient for primary care, but patient needs to call to schedule an appointment himself. Contact information provided on AVS.   Expected Discharge Plan: Home w Home Health Services Barriers to Discharge: Continued Medical Work up  Expected Discharge Plan and Services Expected Discharge Plan: Home w Home Health Services In-house Referral: [facility] (Medicaid pending, need bank statements submitted  to DSS per [facility]) Discharge Planning Services: CM Consult Post Acute Care Choice: Home Health Living arrangements for the past 2 months: Single Family Home                 DME Arranged: Wheelchair manual (Pt was given a bariatric wheelchair in 8/22.  Velna Hatchet at [employer] said they can exchange for a regular wheelchair due to pt current weight.  Pt informed.) DME Agency: [employer] Date DME Agency Contacted: 08/01/21 Time DME Agency Contacted: [phone] Representative spoke with at DME Agency: Francesco Sor Arranged: PT, Social Work [facility] Agency: [facility] Date HH Agency Contacted: 08/04/21 Time HH Agency Contacted: [phone] Representative spoke with at [facility] Agency: Marylene Land   Social Determinants of Health (SDOH) Interventions    Readmission Risk Interventions No flowsheet data found.

## 2021-08-05 LAB — GLUCOSE, CAPILLARY: Glucose-Capillary: 204 mg/dL — ABNORMAL HIGH (ref 70–99)

## 2021-08-05 MED ORDER — OXYCODONE-ACETAMINOPHEN 5-325 MG PO TABS
1.0000 | ORAL_TABLET | ORAL | 0 refills | Status: DC | PRN
Start: 2021-08-05 — End: 2021-11-05

## 2021-08-05 MED ORDER — GLYBURIDE 5 MG PO TABS: 5.0000 mg | ORAL_TABLET | Freq: Two times a day (BID) | ORAL | 1 refills | Status: DC

## 2021-08-05 MED ORDER — OXYCODONE-ACETAMINOPHEN 5-325 MG PO TABS: 1.0000 | ORAL_TABLET | ORAL | 0 refills | Status: DC | PRN

## 2021-08-05 NOTE — Progress Notes (Signed)
Patient given discharge instructions and stated understanding. 

## 2021-08-05 NOTE — Discharge Summary (Signed)
Discharge Diagnoses:  Principal Problem:   Below-knee amputation of left lower extremity ([place]) Active Problems:   Subacute osteomyelitis, left ankle and foot ([place])   Surgeries: Procedure(s): LEFT BELOW KNEE AMPUTATION APPLICATION OF WOUND VAC on 07/30/2021    Consultants:   Discharged Condition: Improved  Hospital Course: Adonijah Kubly is an 51 y.o. male who was admitted 07/30/2021 with a chief complaint of abscess osteomyelitis left foot, with a final diagnosis of osteomyelitis left foot.  Patient was brought to the operating room on 07/30/2021 and underwent Procedure(s): LEFT BELOW KNEE AMPUTATION APPLICATION OF WOUND VAC.    Patient was given perioperative antibiotics:  Anti-infectives (From admission, onward)    Start     Dose/Rate Route Frequency Ordered Stop   07/30/21 1645  ceFAZolin (ANCEF) IVPB 2g/100 mL premix        2 g 200 mL/hr over 30 Minutes Intravenous Every 8 hours 07/30/21 1552 07/31/21 0044   07/30/21 1030  ceFAZolin (ANCEF) IVPB 2g/100 mL premix        2 g 200 mL/hr over 30 Minutes Intravenous On call to O.R. 07/30/21 1020 07/30/21 1300   07/30/21 1027  ceFAZolin (ANCEF) 2-4 GM/100ML-% IVPB       Note to Pharmacy: Odelia Gage E: cabinet override      07/30/21 1027 07/30/21 1311     .  Patient was given sequential compression devices, early ambulation, and aspirin for DVT prophylaxis.  Recent vital signs: Patient Vitals for the past 24 hrs:  BP Temp Temp src Pulse Resp SpO2  08/05/21 0741 (!) 153/77 98 F (36.7 C) Oral 85 18 97 %  08/04/21 1937 (!) 146/79 98.1 F (36.7 C) Oral 88 16 98 %  .  Recent laboratory studies: No results found.  Discharge Medications:   Allergies as of 08/05/2021       Reactions   Morphine Itching        Medication List     STOP taking these medications    ferrous sulfate 325 (65 FE) MG tablet   torsemide 20 MG tablet Commonly known as: DEMADEX   traMADol 50 MG tablet Commonly known as: ULTRAM        TAKE these medications    amiodarone 200 MG tablet Commonly known as: PACERONE Take 1 tablet (200 mg total) by mouth daily.   aspirin EC 81 MG tablet Take 81 mg by mouth daily. Swallow whole. What changed: Another medication with the same name was removed. Continue taking this medication, and follow the directions you see here.   atorvastatin 80 MG tablet Commonly known as: LIPITOR Take 1 tablet (80 mg total) by mouth daily.   digoxin 0.125 MG tablet Commonly known as: LANOXIN Take 1 tablet (0.125 mg total) by mouth daily.   glyBURIDE 5 MG tablet Commonly known as: DIABETA Take 1 tablet (5 mg total) by mouth 2 (two) times daily with a meal. What changed: when to take this   metFORMIN 500 MG tablet Commonly known as: GLUCOPHAGE Take 1 tablet (500 mg total) by mouth 2 (two) times daily.   metoprolol succinate 25 MG 24 hr tablet Commonly known as: TOPROL-XL Take 1/2 tablet (12.5 mg total) by mouth at bedtime.   oxyCODONE-acetaminophen 5-325 MG tablet Commonly known as: PERCOCET/ROXICET Take 1 tablet by mouth every 4 (four) hours as needed.   oxyCODONE-acetaminophen 5-325 MG tablet Commonly known as: PERCOCET/ROXICET Take 1 tablet by mouth every 4 (four) hours as needed.   True Metrix Blood Glucose Test test strip Generic  drug: glucose blood Use as instructed twice daily   TRUEplus Lancets 28G Misc Use as directed twice daily at 8 am and 10 pm.        Diagnostic Studies: DG Chest 2 View  Result Date: 07/07/2021 CLINICAL DATA:  Status post CABG.  Shortness of breath. EXAM: CHEST - 2 VIEW COMPARISON:  Chest radiographs 05/29/2021 FINDINGS: Sequelae of CABG are again identified. The cardiomediastinal silhouette is unchanged with normal heart size. A right jugular sheath has been removed. Pleural effusions on the prior study have resolved. Left basilar airspace opacity on the prior study has resolved, and the lungs are currently clear. No pneumothorax or acute osseous  abnormality is seen. IMPRESSION: Interval resolution of pleural effusions with improved lung aeration. No evidence of active cardiopulmonary disease. Electronically Signed   By: Logan Bores M.D.   On: 07/07/2021 15:15    Patient benefited maximally from their hospital stay and there were no complications.     Disposition: Discharge disposition: 01-Home or Self Care      Discharge Instructions     Call MD / Call 911   Complete by: As directed    If you experience chest pain or shortness of breath, CALL 911 and be transported to the hospital emergency room.  If you develope a fever above 101 F, pus (white drainage) or increased drainage or redness at the wound, or calf pain, call your surgeon's office.   Constipation Prevention   Complete by: As directed    Drink plenty of fluids.  Prune juice may be helpful.  You may use a stool softener, such as Colace (over the counter) 100 mg twice a day.  Use MiraLax (over the counter) for constipation as needed.   Diet - low sodium heart healthy   Complete by: As directed    Increase activity slowly as tolerated   Complete by: As directed    Negative Pressure Wound Therapy - Incisional   Complete by: As directed    Post-operative opioid taper instructions:   Complete by: As directed    POST-OPERATIVE OPIOID TAPER INSTRUCTIONS: It is important to wean off of your opioid medication as soon as possible. If you do not need pain medication after your surgery it is ok to stop day one. Opioids include: Codeine, Hydrocodone(Norco, Vicodin), Oxycodone(Percocet, oxycontin) and hydromorphone amongst others.  Long term and even short term use of opiods can cause: Increased pain response Dependence Constipation Depression Respiratory depression And more.  Withdrawal symptoms can include Flu like symptoms Nausea, vomiting And more Techniques to manage these symptoms Hydrate well Eat regular healthy meals Stay active Use relaxation techniques(deep  breathing, meditating, yoga) Do Not substitute Alcohol to help with tapering If you have been on opioids for less than two weeks and do not have pain than it is ok to stop all together.  Plan to wean off of opioids This plan should start within one week post op of your joint replacement. Maintain the same interval or time between taking each dose and first decrease the dose.  Cut the total daily intake of opioids by one tablet each day Next start to increase the time between doses. The last dose that should be eliminated is the evening dose.          Follow-up Information     Newt Minion, MD Follow up in 1 week(s).   Specialty: Orthopedic Surgery Contact information: [address] [place] [address] [phone]  [facility], [employer]. Call.   Why: This company, now called [employer], will exchange your current wheelchair for a smaller one.  Please call them once you get home to set this up. Contact information: [place] [address] [phone]         [facility] Follow up.   Specialty: [place] Why: the office will call to schedule home health visits Contact information: [address] [place] [address] [place]. Call.   Why: Call to schedule an appointment to establish for primary care - appointments currently booked out a couple months so call ASAP Contact information: [place]. [place], [place]                 Signed: Newt Minion 08/05/2021, 7:47 AM

## 2021-08-05 NOTE — Progress Notes (Signed)
Patient discharged against physical therapy note about not being prepared to leave. Adam James his daughter was on the way and demanded to be released

## 2021-08-05 NOTE — Progress Notes (Signed)
Patient ID: Adam James, male   DOB: [birth year], 51 y.o.   MRN: [id number] Plan for discharge to home today.  Face-to-face completed.  Patient is set up with a home health agency as well as set up with a primary care physician that he will need to call for follow-up.  Plan for discharge with the portable Praveena wound VAC pump.  Therapy has not been by to see the patient in 2 days.

## 2021-08-05 NOTE — Progress Notes (Signed)
Physical Therapy Treatment Patient Details Name: Lathyn Picciotto MRN: [id number] DOB: 05-25-1971 Today's Date: 08/05/2021   History of Present Illness 51 yr old M admitted on 07/30/21.  Pt. underwent L 5th ray amp in Oct 22, but has had progressive deformity of ankle with difficulty amb resulting in need for L BKA.  Underwent sx on day of admission. 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  Recommendations may be updated based on patient status, additional functional criteria and insurance authorization.  Follow Up Recommendations  Home health PT     Assistance Recommended at Discharge Intermittent Supervision/Assistance  Patient can return home with the following A little help with walking and/or transfers;A little help with bathing/dressing/bathroom;Assistance with cooking/housework;Assist for transportation;Help with stairs or ramp for entrance   Equipment Recommendations  Rolling walker (2 wheels);Wheelchair (measurements PT)    Recommendations for Other Services       Precautions / Restrictions Precautions Precautions: Fall Required Braces or Orthoses: Other Brace Other Brace: Limb protector Restrictions LLE Weight Bearing: Non weight bearing Other Position/Activity Restrictions: elevate LLE in bed     Mobility  Bed Mobility Overal bed mobility: Modified Independent Bed Mobility: Supine to Sit;Sit to Supine      Supine to sit: Modified independent (Device/Increase time) Sit to supine: Modified independent (Device/Increase time)   General bed mobility comments: able to move LLE independently to EOB with limb protector donned; with HOB elevated    Transfers Overall transfer level: Needs assistance Equipment used: Rolling walker (2 wheels) Transfers: Sit to/from Stand Sit to Stand: Min guard           General transfer comment: pt ignores safety cues during session for hand placement, states he will " have to manage this way at home"    Ambulation/Gait               General Gait Details: Declined gait training   Stairs         General stair comments: Declined stair training; states he has a "sturdy pole" he can hold onto while hopping up; Offered concerns and to show him options to do stairs but he declined further practice or consideration   Wheelchair Mobility    Modified Rankin (Stroke Patients Only)       Balance     Sitting balance-[name] Scale: Good       Standing balance-[name] Scale: Poor                              Cognition Arousal/Alertness: Awake/alert Behavior During Therapy: Restless;Anxious;Impulsive Overall Cognitive Status: No family/caregiver present to determine baseline cognitive functioning                                 General Comments: pt has very poor insight to his limitations  at this point, states he will "be able to manage just fine at home "        Exercises      General Comments General comments (skin integrity, edema, etc.): Discussed need for stretching hamstrings and hip flexors and strengthening knee and hip extensors in prep for prosthesis      Pertinent Vitals/Pain Pain Assessment: No/denies pain    Home Living                          Prior Function            PT Goals (current goals can now be found in the care plan section) Acute Rehab PT Goals Patient Stated Goal: To  go home PT Goal Formulation: Patient unable to participate in goal setting Time For Goal Achievement: 08/15/21 Potential to Achieve Goals: Good Progress towards PT goals: Progressing toward goals (Slowly, pt self-limiting and not very receptive to suggestions)    Frequency    Min 3X/week      PT Plan Current plan remains appropriate    Co-evaluation              AM-PAC PT "6 Clicks" Mobility   Outcome Measure  Help needed turning from your back to your side while in a flat bed without using bedrails?: None Help needed moving from lying on your back to sitting on the side of a flat bed without using bedrails?: None Help needed moving to and from a bed to a chair (including a wheelchair)?: A Little Help needed standing up from a chair using your arms (e.g., wheelchair or bedside chair)?: A Little Help needed to walk in hospital room?: A Little Help needed climbing 3-5 steps with a railing? : Total 6 Click Score: 18    End of Session   Activity Tolerance: Other (comment) (Limited by decr receptiveness to training and education) Patient left: in bed;with call bell/phone within reach Nurse Communication: Mobility status PT Visit Diagnosis: Unsteadiness on feet (R26.81);Difficulty in walking, not elsewhere classified (R26.2);Pain Pain - Right/Left: Left Pain - part of body: Leg     Time: [id number] PT Time Calculation (min) (ACUTE ONLY): 21 min  Charges:  $Therapeutic Activity: 8-22 mins                     Roney Marion, PT  Acute Rehabilitation Services Pager [phone] Office [place] 08/05/2021, 4:22 PM

## 2021-08-05 NOTE — Progress Notes (Signed)
Occupational Therapy Treatment Patient Details Name: Adam James MRN: [id number] DOB: 18-Aug-1970 Today's Date: 08/05/2021   History of present illness 51 yr old M admitted on 07/30/21.  Pt. underwent L 5th ray amp in Oct 22, but has had progressive deformity of ankle with difficulty amb resulting in need for L BKA.  Underwent sx on day of admission. 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 Pt still has decreased awareness of limitations and poor safety awareness/judgement. Pt impulsive with transfers despite cues for safe hand placement. Reviewed/continued education on limb desensitization, however pt still does not seem receptive to education, states he has not been doing it. Pt presenting with impairments listed below, will continue to follow acutely. Recommend d/c home with assistance and HHOT.   Recommendations for follow up therapy are one component of a multi-disciplinary discharge planning process, led by the attending physician.  Recommendations may be updated based on patient status, additional functional criteria and insurance authorization.    Follow Up Recommendations  Home health OT    Assistance Recommended at Discharge Set up Supervision/Assistance  Patient can return home with the following  A little help with walking and/or transfers;A little help with bathing/dressing/bathroom;Assistance with cooking/housework;Help with stairs or ramp for entrance;Assist for transportation   Equipment Recommendations  Wheelchair (measurements OT);Wheelchair cushion (measurements OT);BSC/3in1 (BSC dual use as shower seat if needed)    Recommendations for Other Services PT consult    Precautions / Restrictions Precautions Precautions: Fall Required Braces or Orthoses: Other Brace Other Brace: Limb  protector Restrictions Weight Bearing Restrictions: No LLE Weight Bearing: Non weight bearing Other Position/Activity Restrictions: elevate LLE in bed       Mobility Bed Mobility Overal bed mobility: Modified Independent Bed Mobility: Supine to Sit;Sit to Supine     Supine to sit: Modified independent (Device/Increase time) Sit to supine: Modified independent (Device/Increase time)   General bed mobility comments: able to move LLE independently to EOB with limb protector donned    Transfers Overall transfer level: Needs assistance Equipment used: Rolling walker (2 wheels) Transfers: Sit to/from Stand Sit to Stand: Min guard           General transfer comment: pt ignores safety cues during session for hand placement, states he will " have to manage this way at home"     Balance Overall balance assessment: Needs assistance Sitting-balance support: Feet supported;Bilateral upper extremity supported Sitting balance-[name] Scale: Good     Standing balance support: Bilateral upper extremity supported;During functional activity Standing balance-[name] Scale: Poor                             ADL either performed or assessed with clinical judgement   ADL Overall ADL's : Needs assistance/impaired Eating/Feeding: Independent;Sitting               Upper Body Dressing : Supervision/safety;Sitting Upper Body Dressing Details (indicate cue type and reason): dons gown around backside sitting EOB     Toilet Transfer: Min guard;Ambulation;Regular Teacher, adult education Details (indicate cue type and reason): simulated in room         Functional mobility during ADLs: Min guard      Extremity/Trunk Assessment Upper Extremity Assessment Upper Extremity Assessment: Overall WFL for tasks assessed   Lower Extremity  Assessment Lower Extremity Assessment: Defer to PT evaluation        Vision   Vision Assessment?: No apparent visual deficits   Perception  Perception Perception: Not tested   Praxis Praxis Praxis: Not tested    Cognition Arousal/Alertness: Awake/alert Behavior During Therapy: Restless;Anxious;Impulsive Overall Cognitive Status: No family/caregiver present to determine baseline cognitive functioning                                 General Comments: pt has very poor insight to his limitations at this point, states he will "be able to manage just fine at home "          Exercises     Shoulder Instructions       General Comments no dizzy/lightheded complaints during session, pt improving with activity tolerance, states he went into bathroom earlier with NT    Pertinent Vitals/ Pain       Pain Assessment: No/denies pain  Home Living                                          Prior Functioning/Environment              Frequency  Min 3X/week        Progress Toward Goals  OT Goals(current goals can now be found in the care plan section)  Progress towards OT goals: Progressing toward goals  Acute Rehab OT Goals Patient Stated Goal: to go home OT Goal Formulation: With patient Time For Goal Achievement: 08/14/21 Potential to Achieve Goals: Good ADL Goals Pt Will Perform Lower Body Dressing: with min assist;sit to/from stand Pt Will Transfer to Toilet: with supervision;regular height toilet;ambulating Pt Will Perform Tub/Shower Transfer: with supervision;ambulating;rolling walker  Plan Discharge plan remains appropriate;Frequency remains appropriate    Co-evaluation                 AM-PAC OT "6 Clicks" Daily Activity     Outcome Measure   Help from another person eating meals?: None Help from another person taking care of personal grooming?: A Little Help from another person toileting, which includes using toliet, bedpan, or urinal?: A Lot Help from another person bathing (including washing, rinsing, drying)?: A Lot Help from another person to put on and  taking off regular upper body clothing?: None Help from another person to put on and taking off regular lower body clothing?: A Little 6 Click Score: 18    End of Session Equipment Utilized During Treatment: Gait belt;Rolling walker (2 wheels)  OT Visit Diagnosis: Unsteadiness on feet (R26.81);Other abnormalities of gait and mobility (R26.89);Muscle weakness (generalized) (M62.81)   Activity Tolerance Treatment limited secondary to agitation   Patient Left in bed;with call bell/phone within reach;with bed alarm set   Nurse Communication Mobility status        Time: 5681-2751 OT Time Calculation (min): 16 min  Charges: OT Treatments $Self Care/Home Management : 8-22 mins  Alfonzo Beers, OTD, OTR/L Acute Rehab [phone]) [phone]  Mayer Masker 08/05/2021, 9:33 AM

## 2021-08-05 NOTE — TOC Transition Note (Addendum)
Transition of Care [facility]) - CM/SW Discharge Note   Patient Details  Name: Adam James MRN: [id number] Date of Birth: [birth year]  Transition of Care [facility]) CM/SW Contact:  Epifanio Lesches, RN Phone Number: 08/05/2021, 11:24 AM   Clinical Narrative:    Patient will DC to: home Anticipated DC date: 08/05/2021 Family notified: yes, daughter Transport by: car        s/p left transtibial amputation, 1/4 Per MD patient ready for DC today. RN, patient, patient's daughter and  Marylene Land / [facility] Agency notified of DC ( will f/u within 24-48 hrs).[facility] to f/u with pt for W/C exchange.  Pt without Rx med concerns.  Post hospital f/u noted on AVS.  RNCM will sign off for now as intervention is no longer needed. Please consult [place] again if new needs arise.   Final next level of care: Home w Home Health Services Barriers to Discharge: Continued Medical Work up   Patient Goals and CMS Choice Patient states their goals for this hospitalization and ongoing recovery are:: return home   Choice offered to / list presented to : Patient  Discharge Placement                       Discharge Plan and Services In-house Referral: [facility] (Medicaid pending, need bank statements submitted  to DSS per [facility] [employer]) Discharge Planning Services: CM Consult Post Acute Care Choice: Home Health          DME Arranged: Wheelchair manual (Pt was given a bariatric wheelchair in 8/22.  Velna Hatchet at [facility] said they can exchange for a regular wheelchair due to pt current weight.  Pt informed.) DME Agency: [facility] Date DME Agency Contacted: 08/01/21 Time DME Agency Contacted: [phone] Representative spoke with at DME Agency: Francesco Sor Arranged: PT, Social Work [facility] Agency: [facility] Date HH Agency Contacted: 08/04/21 Time HH Agency Contacted: [phone] Representative spoke with at [facility] Agency: Marylene Land  Social Determinants of Health (SDOH) Interventions     Readmission  Risk Interventions No flowsheet data found.

## 2021-08-07 ENCOUNTER — Encounter: Payer: Self-pay | Admitting: Orthopedic Surgery

## 2021-08-12 ENCOUNTER — Ambulatory Visit (INDEPENDENT_AMBULATORY_CARE_PROVIDER_SITE_OTHER): Payer: Self-pay | Admitting: Family

## 2021-08-12 ENCOUNTER — Encounter: Payer: Self-pay | Admitting: Family

## 2021-08-12 DIAGNOSIS — M86272 Subacute osteomyelitis, left ankle and foot: Secondary | ICD-10-CM

## 2021-08-12 NOTE — Progress Notes (Signed)
Post-Op Visit Note   Patient: Adam ParrMichael Paul           Date of Birth: 09/30/1970           MRN: [id number] Visit Date: 08/12/2021 PCP: Pcp, No  Chief Complaint:  Chief Complaint  Patient presents with   Left Leg - Routine Post Op    Left BKA 07/30/21 Wound vac removed today and no drainage in vac Wearing shrinker and stump protector Photos taken today    HPI:  HPI The patient is a 51 year old gentleman seen status post left below knee amputation. Ortho Exam Staples in place. Incision is well approximated with staples. There is no erythema, edema, drainage. No sign of infection  Visit Diagnoses: No diagnosis found.  Plan: continue daily dial soap cleansing, dry dressings. Shrinker. Follow up in 1 week for staple removal  Follow-Up Instructions: Return in about 1 week (around 08/19/2021).   Imaging: No results found.  Orders:  No orders of the defined types were placed in this encounter.  No orders of the defined types were placed in this encounter.    PMFS History: Patient Active Problem List   Diagnosis Date Noted   Below-knee amputation of left lower extremity (HCC) 07/30/2021   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   Acute osteomyelitis of left foot (HCC)    Acute exacerbation of CHF (congestive heart failure) (HCC) 04/28/2021   Venous stasis ulcer of right calf limited to breakdown of skin without varicose veins (HCC)    Non-pressure chronic ulcer of other part of left foot limited to breakdown of skin (HCC)    Subacute osteomyelitis, left ankle and foot (HCC)    Acute on chronic combined systolic and diastolic CHF (congestive heart failure) (HCC)    Acute on chronic heart failure (HCC) 03/23/2021   NSTEMI (non-ST elevated myocardial infarction) (HCC)    Gastroesophageal reflux disease with esophagitis and hemorrhage    Anemia, posthemorrhagic, acute    Acute esophagitis    Gastrointestinal hemorrhage    Necrotizing fasciitis (HCC)    Left leg cellulitis    Diabetic  polyneuropathy associated with type 2 diabetes mellitus (HCC)    Severe protein-calorie malnutrition (HCC)    Gangrene of left foot (HCC) 02/25/2021   Type 2 diabetes mellitus (HCC)    Heart failure (HCC)    Dyslipidemia    Diabetic foot ulcer (HCC)    CAD (coronary artery disease)    Cardiomyopathy (HCC)    Coronary artery disease involving native coronary artery of native heart with angina pectoris (HCC)    Past Medical History:  Diagnosis Date   Anemia    GI bleed; 03/13/11 EGD: relux esophagitis with ulceration/clot   CAD (coronary artery disease)    Cardiomyopathy (HCC)    CHF (congestive heart failure) (HCC)    CKD (chronic kidney disease)    Diabetic foot ulcer (HCC)    Dyslipidemia    Dyspnea    Heart failure (HCC)    Hypertension    Myocardial infarction (HCC)    Type 2 diabetes mellitus (HCC)     Family History  Problem Relation Age of Onset   Hypertension Mother    Heart failure Father    Hypertension Brother    Congestive Heart Failure Brother     Past Surgical History:  Procedure Laterality Date   AMPUTATION Left 04/30/2021   Procedure: LEFT FOOT 5TH RAY AMPUTATION APPLICATION OF A-CELL POWDER;  Surgeon: Nadara Mustarduda, Marcus V, MD;  Location: [facility] OR;  Service: Orthopedics;  Laterality: Left;   AMPUTATION Left 07/30/2021   Procedure: LEFT BELOW KNEE AMPUTATION;  Surgeon: [name], MD;  Location: [facility] OR;  Service: Orthopedics;  Laterality: Left;   APPENDECTOMY     APPLICATION OF WOUND VAC Left 03/03/2021   Procedure: APPLICATION OF WOUND VAC;  Surgeon: Tarry Kos, MD;  Location: [facility] OR;  Service: Orthopedics;  Laterality: Left;   APPLICATION OF WOUND VAC  03/07/2021   Procedure: APPLICATION OF WOUND VAC;  Surgeon: [name], MD;  Location: [facility] OR;  Service: Orthopedics;;   APPLICATION OF WOUND VAC  07/30/2021   Procedure: APPLICATION OF WOUND VAC;  Surgeon: [name], MD;  Location: [facility] OR;  Service: Orthopedics;;   CARDIAC CATHETERIZATION     COLON SURGERY      CORONARY ARTERY BYPASS GRAFT N/A 05/22/2021   Procedure: CORONARY ARTERY BYPASS GRAFTING (CABG) X 3 USING LEFT INTERNAL MAMMARY ARTERY AND RIGHT ENDOSCOPIC GREATER SAPHENOUS VEIN CONDUITS;  Surgeon: Corliss Skains, MD;  Location: [facility] OR;  Service: Open Heart Surgery;  Laterality: N/A;   ENDOVEIN HARVEST OF GREATER SAPHENOUS VEIN Right 05/22/2021   Procedure: ENDOVEIN HARVEST OF GREATER SAPHENOUS VEIN;  Surgeon: Corliss Skains, MD;  Location: [facility] OR;  Service: Open Heart Surgery;  Laterality: Right;   ESOPHAGOGASTRODUODENOSCOPY (EGD) WITH PROPOFOL N/A 03/12/2021   Procedure: ESOPHAGOGASTRODUODENOSCOPY (EGD) WITH PROPOFOL;  Surgeon: Jenel Lucks, MD;  Location[facility] ENDOSCOPY;  Service: Gastroenterology;  Laterality: N/A;   I & D EXTREMITY Left 02/24/2021   Procedure: IRRIGATION AND DEBRIDEMENT EXTREMITY;  Surgeon: Myrene Galas, MD;  Location[facility] OR;  Service: Orthopedics;  Laterality: Left;   I & D EXTREMITY Left 02/26/2021   Procedure: IRRIGATION AND DEBRIDEMENT OF LEG;  Surgeon: [name], MD;  Location: [facility] OR;  Service: Orthopedics;  Laterality: Left;   I & D EXTREMITY Left 03/03/2021   Procedure: LEFT LEG IRRIGATION AND DEBRIDEMENT;  Surgeon: Tarry Kos, MD;  Location: [facility] OR;  Service: Orthopedics;  Laterality: Left;   I & D EXTREMITY Left 03/07/2021   Procedure: REPEAT DEBRIDEMENT LEFT LEG;  Surgeon: [name], MD;  Location: [facility] OR;  Service: Orthopedics;  Laterality: Left;   IR FLUORO GUIDE CV LINE RIGHT  03/10/2021   IR REMOVAL TUN CV CATH W/O FL  03/24/2021   IR US GUIDE VASC ACCESS RIGHT  03/10/2021   LEFT HEART CATH AND CORONARY ANGIOGRAPHY N/A 09/13/2020   Procedure: LEFT HEART CATH AND CORONARY ANGIOGRAPHY;  Surgeon: Lennette Bihari, MD;  Location: [facility] INVASIVE CV LAB;  Service: Cardiovascular;  Laterality: N/A;   LEFT HEART CATH AND CORONARY ANGIOGRAPHY N/A 03/28/2021   Procedure: LEFT HEART CATH AND CORONARY ANGIOGRAPHY;  Surgeon: Laurey Morale, MD;  Location:  [facility] INVASIVE CV LAB;  Service: Cardiovascular;  Laterality: N/A;   RIGHT HEART CATH N/A 03/26/2021   Procedure: RIGHT HEART CATH;  Surgeon: Laurey Morale, MD;  Location: [facility] INVASIVE CV LAB;  Service: Cardiovascular;  Laterality: N/A;   TEE WITHOUT CARDIOVERSION N/A 05/22/2021   Procedure: TRANSESOPHAGEAL ECHOCARDIOGRAM (TEE);  Surgeon: Corliss Skains, MD;  Location: [facility] OR;  Service: Open Heart Surgery;  Laterality: N/A;   TOE AMPUTATION  2020   Social History   Occupational History   Occupation: none    Comment: former "[employer]"  Tobacco Use   Smoking status: Former    Packs/day: 3.00    Years: 36.00    Pack years: 108.00    Types: Cigarettes  Quit date: 08/27/2020    Years since quitting: 0.9   Smokeless tobacco: Never   Tobacco comments:    5-6 daily  Vaping Use   Vaping Use: Never used  Substance and Sexual Activity   Alcohol use: Not Currently    Comment: Very rare   Drug use: Not Currently    Types: Marijuana   Sexual activity: Not on file

## 2021-08-13 ENCOUNTER — Encounter: Payer: Self-pay | Admitting: Family

## 2021-08-20 ENCOUNTER — Encounter: Payer: Self-pay | Admitting: Family

## 2021-08-22 ENCOUNTER — Encounter (HOSPITAL_COMMUNITY): Payer: Medicaid Other

## 2021-08-22 NOTE — Progress Notes (Incomplete)
Advanced Heart Failure Clinic Note    PCP: Pcp, No PCP-Cardiologist: Adam Herrlich, MD  HF Cardiologist: Dr. Shirlee James  HPI: [name] is a 51 y.o.with a history CAD, Had PCI 2007 in [place]. 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   Admitted to [facility] in February 2022 with lower extremity edema and foot ulcer. BNP and HS Trop elevated and felt to be non-ACS.  Cardiology consulted and he was placed on lasix.    He had follow up with Dr Adam James 09/09/20 and was set up for cath. Cath with multivessel CAD. Saw Dr Adam James in the office with plans for CMRI. He was referred to SW due to financial concerns and no payor source. CMRI showed EF 40% and viable myocardium.     Saw Dr Adam James in July 2022 and was set up for CABG in August. Pre CABG labs completed and showed WBC 25. He was advised to go to the ED.    Admitted 02/25/2021 with left foot infection complicated by osteomyelitis and necrotizing fasciitis. 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 Hospital course complicated by erosive esophagitis on EGD. Recommendations for protonix 40 mg bid + repeat EGD in 8 weeks. Received multiple transfusions. Discharged 03/15/21.    Admitted 03/23/21 with A/C CHF exacerbation. Started on IV lasix. Cardiology consulted. CO-OX 55%. Given 80 mg IV x1. V lasix increased to 100 mg tid and started on metolazone.  Repeat echo on 03/23/21 with LVEF of 25%, LV demonstrating global hypokinesis, and LV diastolic parameters consistent with grade II diastolic dysfunction. RHC performed 03/26/21 showed preserved cardiac output, mildly elevated PCWP, and mild pulmonary venous hypertension. LHC performed on 03/28/21 showed  minimal change from prior study in 2/22. Ost LAD to Mid LAD 90% stenosed, mid LAD to Dist LAD 90% stenosed, dist LAD lesion 100% stenosed.prox Cx  70% stenosed, prox RCA to Mid RCA 100% stenosed, dist RCA 100% stenosed, ramus 70% stenosed, 1st Mrg 50% stenosed, mid LM to Dist LM 60% stenosed. Hospitalization c/b AKI on CKD 3.   After full treatment of foot infection, he was admitted in 10/22 for CABG with LIMA-LAD, SVG-OM1, SVG-OM3.  PDA too small to graft.   Today he returns for HF follow up. BP was low last visit and Entresto was stopped. Does not want to take spiro anymore because he read it blocks testosterone. He has been off all meds for a week. No SOB with walking on flat ground. Overall feeling fine. Denies CP, dizziness, edema, or PND/Orthopnea. Appetite ok. No fever or chills. Does not weigh at home. He lives with his daughter, uses [employer]. Continues to wear a boot and is followed by Dr. Lajoyce James.   ReDs: 24%  Labs (11/22): K 4.2, creatinine 1.38 Labs (12/22): K 4.4, creatinine 1.94  ECG (personally reviewed): none ordered today.  PMH: 1. GI bleeding: Erosive esophagitis 8/22.  2. CKD stage 3 3. Type 2 diabetes 4. Hyperlipidemia 5. Left foot/leg diabetic infection with necrotizing fasciitis: 8/22, debridements were done and the left 5th metatarsal was amputated.   6. Prior smoker 7. H/o PVC, VT: On amiodarone.  8. CAD: LHC in 9/22 with severe 3 vessel disease.  - CABG (10/22) with LIMA-LAD, SVG-OM2, SVG-OM3.  9. Chronic systolic CHF: Ischemic cardiomyopathy.  - Echo (8/22): LVEF of 25%, LV demonstrating global hypokinesis, and LV diastolic parameters consistent with grade II diastolic dysfunction.   Review of Systems: All  systems reviewed and negative except as per HPI.    Current Outpatient Medications  Medication Sig Dispense Refill   amiodarone (PACERONE) 200 MG tablet Take 1 tablet (200 mg total) by mouth daily. 30 tablet 3   aspirin EC 81 MG tablet Take 81 mg by mouth daily. Swallow whole.     atorvastatin (LIPITOR) 80 MG tablet Take 1 tablet (80 mg total) by mouth daily. 30 tablet 3   digoxin (LANOXIN)  0.125 MG tablet Take 1 tablet (0.125 mg total) by mouth daily. 30 tablet 1   glucose blood (TRUE METRIX BLOOD GLUCOSE TEST) test strip Use as instructed twice daily 100 each 12   glyBURIDE (DIABETA) 5 MG tablet Take 1 tablet (5 mg total) by mouth 2 (two) times daily with a meal. 60 tablet 1   metFORMIN (GLUCOPHAGE) 500 MG tablet Take 1 tablet (500 mg total) by mouth 2 (two) times daily. 60 tablet 1   metoprolol succinate (TOPROL-XL) 25 MG 24 hr tablet Take 1/2 tablet (12.5 mg total) by mouth at bedtime. 15 tablet 1   oxyCODONE-acetaminophen (PERCOCET/ROXICET) 5-325 MG tablet Take 1 tablet by mouth every 4 (four) hours as needed. 30 tablet 0   oxyCODONE-acetaminophen (PERCOCET/ROXICET) 5-325 MG tablet Take 1 tablet by mouth every 4 (four) hours as needed. 30 tablet 0   TRUEplus Lancets 28G MISC Use as directed twice daily at 8 am and 10 pm. 100 each 11   No current facility-administered medications for this visit.   Allergies  Allergen Reactions   Morphine Itching   Social History   Socioeconomic History   Marital status: Single    Spouse name: Not on file   Number of children: 1   Years of education: Not on file   Highest education level: 8th grade  Occupational History   Occupation: none    Comment: former "[employer]"  Tobacco Use   Smoking status: Former    Packs/day: 3.00    Years: 36.00    Pack years: 108.00    Types: Cigarettes    Quit date: 08/27/2020    Years since quitting: 0.9   Smokeless tobacco: Never   Tobacco comments:    5-6 daily  Vaping Use   Vaping Use: Never used  Substance and Sexual Activity   Alcohol use: Not Currently    Comment: Very rare   Drug use: Not Currently    Types: Marijuana   Sexual activity: Not on file  Other Topics Concern   Not on file  Social History Narrative   Not on file   Social Determinants of Health   Financial Resource Strain: High Risk   Difficulty of Paying Living Expenses: Hard  Food Insecurity: No Food Insecurity    Worried About Charity fundraiser in the Last Year: Never true   Ran Out of Food in the Last Year: Never true  Transportation Needs: Unmet Transportation Needs   Lack of Transportation (Medical): Yes   Lack of Transportation (Non-Medical): Yes  Physical Activity: Not on file  Stress: Not on file  Social Connections: Not on file  Intimate Partner Violence: Not on file   Family History  Problem Relation Age of Onset   Hypertension Mother    Heart failure Father    Hypertension Brother    Congestive Heart Failure Brother    There were no vitals taken for this visit.  Wt Readings from Last 3 Encounters:  07/30/21 99.8 kg (220 lb)  07/11/21 102.9 kg (226 lb 12.8 oz)  07/07/21 101.2 kg (223 lb)   PHYSICAL EXAM: General:  NAD. No resp difficulty HEENT: Normal Neck: Supple. No JVD. Carotids 2+ bilat; no bruits. No lymphadenopathy or thryomegaly appreciated. Cor: PMI nondisplaced. Regular rate & rhythm. No rubs, gallops or murmurs. Lungs: Clear Abdomen: Soft, nontender, nondistended. No hepatosplenomegaly. No bruits or masses. Good bowel sounds. Extremities: No cyanosis, clubbing, rash, trace BLE edema, ortho boot left leg Neuro: Alert & oriented x 3, cranial nerves grossly intact. Moves all 4 extremities w/o difficulty. Affect pleasant.  ASSESSMENT & PLAN: 1. CAD: LHC in 2/22 with 50% dLM, 70% ramus, 70% pLCx, totally occluded proximal RCA, 90% stenosis ostial to mid LAD, 90% mid LAD, totally occluded distal LAD.  Cardiac MRI in 4/22 showed EF 40% with substantial viable myocardium, only delayed enhancement was subendocardial in the mid to apical inferior wall.  With this information, CABG had been planned in 8/22, but patient was admitted prior with left foot diabetic foot infection that progressed to necrotizing fasciitis.  Patient is now s/p extensive left lower leg debridement.  Repeat coronary angiography (9/22) showed minimal change from prior study in 2/22.  CABG x 3 with LIMA-LAD,  SVG-OM2, and SVG-OM3 was finally done in 10/22. No chest pain.  - Restart ASA 81 daily.  - Restart atorvastatin 80 mg daily. Check lipids next appt.  2. Chronic systolic CHF: Ischemic cardiomyopathy.  Initial echo in 2/22 with EF 40-45%.  Echo in 7/22 with EF 30-35%.  Echo in 8/22 with EF down to 25% with mild RV dysfunction and dilated IVC.  [place] 8/22 with preserved CO, mildly elevated PCWP, mild pulmonary hypertension.  NYHA class II symptoms, more limited by left leg.  BP is better today off Entresto. He is not volume overloaded on exam, ReDs 24%, I am concerned he may be slightly hypovolemic. - Restart Toprol XL 12.5 mg q hs. - Restart digoxin 0.125 mg daily. Dig level in 1 week. - Stop spiro. Plan to add back next, but will switch to eplerenone.  - Remain off torsemide until next visit. Plan to add this back as well. He needs to weigh daily and notify clinic if he starts swelling or becomes SOB. - Remain off Entresto. - Have avoided SGLT2i with history of necrotizing fasciitis, but think he may be able to start in future.  - Echo in 2/23 to decide on ICD.  3. CKD: Stage 3.  BMET today. 4. GI bleeding: History of erosive esophagitis earlier in 8/22.  He is now on Protonix.   - CBC today. 5. 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  - Follows with Dr. Sharol Given for wound care. - Needs PT at home to help mobilize him.  6. DM2: Needs a PCP/endocrinologist.  7. SDOH: Will ask HFSW to help with medications. Should be able to get all heart medications through [facility] outpatient pharmacy delivered to his home.  - May benefit from paramedicine.  Follow up with PharmD in 3 weeks for med titration (add eplerenone 12.5 if BP allows and torsemide 20 mg daily, will need BMET), and APP in 6 weeks.  Keep scheduled appointment for echo 08/2021.   [place], FNP 08/22/21

## 2021-08-27 ENCOUNTER — Other Ambulatory Visit: Payer: Self-pay

## 2021-08-27 ENCOUNTER — Ambulatory Visit (INDEPENDENT_AMBULATORY_CARE_PROVIDER_SITE_OTHER): Payer: Self-pay | Admitting: Family

## 2021-08-27 ENCOUNTER — Encounter: Payer: Self-pay | Admitting: Family

## 2021-08-27 DIAGNOSIS — Z89512 Acquired absence of left leg below knee: Secondary | ICD-10-CM

## 2021-08-27 NOTE — Progress Notes (Signed)
Post-Op Visit Note   Patient: Adam James           Date of Birth: 10/25/1970           MRN: [id number] Visit Date: 08/27/2021 PCP: Pcp, No  Chief Complaint: No chief complaint on file.   HPI:  HPI The patient is a 51 year old gentleman seen status post left below-knee amputation.  Staples are in place today. Ortho Exam On examination of the left residual limb this is well consolidated well-healed staples without incident  Visit Diagnoses:  1. History of left below knee amputation (HCC)     Plan: Staples harvested today.  Patient will follow-up in the office in 4 weeks.  Given an order for his prosthesis set up  Follow-Up Instructions: No follow-ups on file.   Imaging: No results found.  Orders:  No orders of the defined types were placed in this encounter.  No orders of the defined types were placed in this encounter.    PMFS History: Patient Active Problem List   Diagnosis Date Noted   Below-knee amputation of left lower extremity (HCC) 07/30/2021   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   Acute osteomyelitis of left foot (HCC)    Acute exacerbation of CHF (congestive heart failure) (HCC) 04/28/2021   Venous stasis ulcer of right calf limited to breakdown of skin without varicose veins (HCC)    Non-pressure chronic ulcer of other part of left foot limited to breakdown of skin (HCC)    Subacute osteomyelitis, left ankle and foot (HCC)    Acute on chronic combined systolic and diastolic CHF (congestive heart failure) (HCC)    Acute on chronic heart failure (HCC) 03/23/2021   NSTEMI (non-ST elevated myocardial infarction) (HCC)    Gastroesophageal reflux disease with esophagitis and hemorrhage    Anemia, posthemorrhagic, acute    Acute esophagitis    Gastrointestinal hemorrhage    Necrotizing fasciitis (HCC)    Left leg cellulitis    Diabetic polyneuropathy associated with type 2 diabetes mellitus (HCC)    Severe protein-calorie malnutrition (HCC)    Gangrene of left  foot (HCC) 02/25/2021   Type 2 diabetes mellitus (HCC)    Heart failure (HCC)    Dyslipidemia    Diabetic foot ulcer (HCC)    CAD (coronary artery disease)    Cardiomyopathy (HCC)    Coronary artery disease involving native coronary artery of native heart with angina pectoris (HCC)    Past Medical History:  Diagnosis Date   Anemia    GI bleed; 03/13/11 EGD: relux esophagitis with ulceration/clot   CAD (coronary artery disease)    Cardiomyopathy (HCC)    CHF (congestive heart failure) (HCC)    CKD (chronic kidney disease)    Diabetic foot ulcer (HCC)    Dyslipidemia    Dyspnea    Heart failure (HCC)    Hypertension    Myocardial infarction (HCC)    Type 2 diabetes mellitus (HCC)     Family History  Problem Relation Age of Onset   Hypertension Mother    Heart failure Father    Hypertension Brother    Congestive Heart Failure Brother     Past Surgical History:  Procedure Laterality Date   AMPUTATION Left 04/30/2021   Procedure: LEFT FOOT 5TH RAY AMPUTATION APPLICATION OF A-CELL POWDER;  Surgeon: Nadara Mustarduda, Marcus V, MD;  Location: [facility] OR;  Service: Orthopedics;  Laterality: Left;   AMPUTATION Left 07/30/2021   Procedure: LEFT BELOW KNEE AMPUTATION;  Surgeon: Nadara Mustarduda, Marcus V, MD;  Location: [facility] OR;  Service: Orthopedics;  Laterality: Left;   APPENDECTOMY     APPLICATION OF WOUND VAC Left 03/03/2021   Procedure: APPLICATION OF WOUND VAC;  Surgeon: Tarry Kos, MD;  Location: [facility] OR;  Service: Orthopedics;  Laterality: Left;   APPLICATION OF WOUND VAC  03/07/2021   Procedure: APPLICATION OF WOUND VAC;  Surgeon: [name], MD;  Location: [facility] OR;  Service: Orthopedics;;   APPLICATION OF WOUND VAC  07/30/2021   Procedure: APPLICATION OF WOUND VAC;  Surgeon: [name], MD;  Location: [facility] OR;  Service: Orthopedics;;   CARDIAC CATHETERIZATION     COLON SURGERY     CORONARY ARTERY BYPASS GRAFT N/A 05/22/2021   Procedure: CORONARY ARTERY BYPASS GRAFTING (CABG) X 3 USING LEFT INTERNAL  MAMMARY ARTERY AND RIGHT ENDOSCOPIC GREATER SAPHENOUS VEIN CONDUITS;  Surgeon: Corliss Skains, MD;  Location: [facility] OR;  Service: Open Heart Surgery;  Laterality: N/A;   ENDOVEIN HARVEST OF GREATER SAPHENOUS VEIN Right 05/22/2021   Procedure: ENDOVEIN HARVEST OF GREATER SAPHENOUS VEIN;  Surgeon: Corliss Skains, MD;  Location: [facility] OR;  Service: Open Heart Surgery;  Laterality: Right;   ESOPHAGOGASTRODUODENOSCOPY (EGD) WITH PROPOFOL N/A 03/12/2021   Procedure: ESOPHAGOGASTRODUODENOSCOPY (EGD) WITH PROPOFOL;  Surgeon: Jenel Lucks, MD;  Location: [facility] ENDOSCOPY;  Service: Gastroenterology;  Laterality: N/A;   I & D EXTREMITY Left 02/24/2021   Procedure: IRRIGATION AND DEBRIDEMENT EXTREMITY;  Surgeon: Myrene Galas, MD;  Location: [facility] OR;  Service: Orthopedics;  Laterality: Left;   I & D EXTREMITY Left 02/26/2021   Procedure: IRRIGATION AND DEBRIDEMENT OF LEG;  Surgeon: [name], MD;  Location: [facility] OR;  Service: Orthopedics;  Laterality: Left;   I & D EXTREMITY Left 03/03/2021   Procedure: LEFT LEG IRRIGATION AND DEBRIDEMENT;  Surgeon: Tarry Kos, MD;  Location: [facility] OR;  Service: Orthopedics;  Laterality: Left;   I & D EXTREMITY Left 03/07/2021   Procedure: REPEAT DEBRIDEMENT LEFT LEG;  Surgeon: [name], MD;  Location: [facility] OR;  Service: Orthopedics;  Laterality: Left;   IR FLUORO GUIDE CV LINE RIGHT  03/10/2021   IR REMOVAL TUN CV CATH W/O FL  03/24/2021   IR US GUIDE VASC ACCESS RIGHT  03/10/2021   LEFT HEART CATH AND CORONARY ANGIOGRAPHY N/A 09/13/2020   Procedure: LEFT HEART CATH AND CORONARY ANGIOGRAPHY;  Surgeon: Lennette Bihari, MD;  Location: [facility] INVASIVE CV LAB;  Service: Cardiovascular;  Laterality: N/A;   LEFT HEART CATH AND CORONARY ANGIOGRAPHY N/A 03/28/2021   Procedure: LEFT HEART CATH AND CORONARY ANGIOGRAPHY;  Surgeon: Laurey Morale, MD;  Location: [facility] INVASIVE CV LAB;  Service: Cardiovascular;  Laterality: N/A;   RIGHT HEART CATH N/A 03/26/2021   Procedure: RIGHT HEART  CATH;  Surgeon: Laurey Morale, MD;  Location: [facility] INVASIVE CV LAB;  Service: Cardiovascular;  Laterality: N/A;   TEE WITHOUT CARDIOVERSION N/A 05/22/2021   Procedure: TRANSESOPHAGEAL ECHOCARDIOGRAM (TEE);  Surgeon: Corliss Skains, MD;  Location: [facility] OR;  Service: Open Heart Surgery;  Laterality: N/A;   TOE AMPUTATION  2020   Social History   Occupational History   Occupation: none    Comment: former "Warden/ranger  Tobacco Use   Smoking status: Former    Packs/day: 3.00    Years: 36.00    Pack years: 108.00    Types: Cigarettes    Quit date: 08/27/2020    Years since quitting: 1.0   Smokeless tobacco: Never   Tobacco comments:  5-6 daily  Vaping Use   Vaping Use: Never used  Substance and Sexual Activity   Alcohol use: Not Currently    Comment: Very rare   Drug use: Not Currently    Types: Marijuana   Sexual activity: Not on file

## 2021-08-28 ENCOUNTER — Ambulatory Visit (HOSPITAL_COMMUNITY): Admission: RE | Admit: 2021-08-28 | Payer: Self-pay | Source: Ambulatory Visit

## 2021-10-05 ENCOUNTER — Emergency Department (HOSPITAL_BASED_OUTPATIENT_CLINIC_OR_DEPARTMENT_OTHER): Payer: Self-pay

## 2021-10-05 ENCOUNTER — Emergency Department (HOSPITAL_COMMUNITY)
Admission: EM | Admit: 2021-10-05 | Discharge: 2021-10-05 | Disposition: A | Discharge status: Home / Self Care | Payer: Self-pay | Attending: Emergency Medicine | Admitting: Emergency Medicine

## 2021-10-05 ENCOUNTER — Other Ambulatory Visit: Payer: Self-pay

## 2021-10-05 ENCOUNTER — Emergency Department (HOSPITAL_COMMUNITY): Payer: Self-pay

## 2021-10-05 DIAGNOSIS — Z7984 Long term (current) use of oral hypoglycemic drugs: Secondary | ICD-10-CM | POA: Insufficient documentation

## 2021-10-05 DIAGNOSIS — M7989 Other specified soft tissue disorders: Secondary | ICD-10-CM | POA: Insufficient documentation

## 2021-10-05 DIAGNOSIS — E1122 Type 2 diabetes mellitus with diabetic chronic kidney disease: Secondary | ICD-10-CM | POA: Insufficient documentation

## 2021-10-05 DIAGNOSIS — N189 Chronic kidney disease, unspecified: Secondary | ICD-10-CM | POA: Insufficient documentation

## 2021-10-05 DIAGNOSIS — I509 Heart failure, unspecified: Secondary | ICD-10-CM | POA: Insufficient documentation

## 2021-10-05 DIAGNOSIS — U071 COVID-19: Secondary | ICD-10-CM | POA: Insufficient documentation

## 2021-10-05 DIAGNOSIS — I2510 Atherosclerotic heart disease of native coronary artery without angina pectoris: Secondary | ICD-10-CM | POA: Insufficient documentation

## 2021-10-05 DIAGNOSIS — Z79899 Other long term (current) drug therapy: Secondary | ICD-10-CM | POA: Insufficient documentation

## 2021-10-05 DIAGNOSIS — R000 Tachycardia, unspecified: Secondary | ICD-10-CM | POA: Insufficient documentation

## 2021-10-05 DIAGNOSIS — Z7982 Long term (current) use of aspirin: Secondary | ICD-10-CM | POA: Insufficient documentation

## 2021-10-05 DIAGNOSIS — R609 Edema, unspecified: Secondary | ICD-10-CM

## 2021-10-05 LAB — CBC
HCT: 37 % — ABNORMAL LOW (ref 39.0–52.0)
Hemoglobin: 11.4 g/dL — ABNORMAL LOW (ref 13.0–17.0)
MCH: 27.7 pg (ref 26.0–34.0)
MCHC: 30.8 g/dL (ref 30.0–36.0)
MCV: 90 fL (ref 80.0–100.0)
Platelets: 247 10*3/uL (ref 150–400)
RBC: 4.11 MIL/uL — ABNORMAL LOW (ref 4.22–5.81)
RDW: 15.1 % (ref 11.5–15.5)
WBC: 7.1 10*3/uL (ref 4.0–10.5)
nRBC: 0 % (ref 0.0–0.2)

## 2021-10-05 LAB — TROPONIN I (HIGH SENSITIVITY)
Troponin I (High Sensitivity): 35 ng/L — ABNORMAL HIGH (ref <18)
Troponin I (High Sensitivity): 33 ng/L — ABNORMAL HIGH (ref <18)

## 2021-10-05 LAB — BASIC METABOLIC PANEL
Anion gap: 8 (ref 5–15)
BUN: 14 mg/dL (ref 6–20)
CO2: 26 mmol/L (ref 22–32)
Calcium: 8.3 mg/dL — ABNORMAL LOW (ref 8.9–10.3)
Chloride: 104 mmol/L (ref 98–111)
Creatinine, Ser: 1.44 mg/dL — ABNORMAL HIGH (ref 0.61–1.24)
GFR, Estimated: 60 mL/min — ABNORMAL LOW (ref >60)
Glucose, Bld: 241 mg/dL — ABNORMAL HIGH (ref 70–99)
Potassium: 3.7 mmol/L (ref 3.5–5.1)
Sodium: 138 mmol/L (ref 135–145)

## 2021-10-05 LAB — RESP PANEL BY RT-PCR (FLU A&B, COVID) ARPGX2
Influenza A by PCR: NEGATIVE
Influenza B by PCR: NEGATIVE
SARS Coronavirus 2 by RT PCR: POSITIVE — AB

## 2021-10-05 LAB — BRAIN NATRIURETIC PEPTIDE: B Natriuretic Peptide: 933.3 pg/mL — ABNORMAL HIGH (ref 0.0–100.0)

## 2021-10-05 IMAGING — CR DG CHEST 2V
2 series · 2 of 2 positions shown · non-contrast
Comparison: [DATE]

CLINICAL DATA: Shortness of breath, leg swelling

EXAM:
CHEST - 2 VIEW

[chest lat]
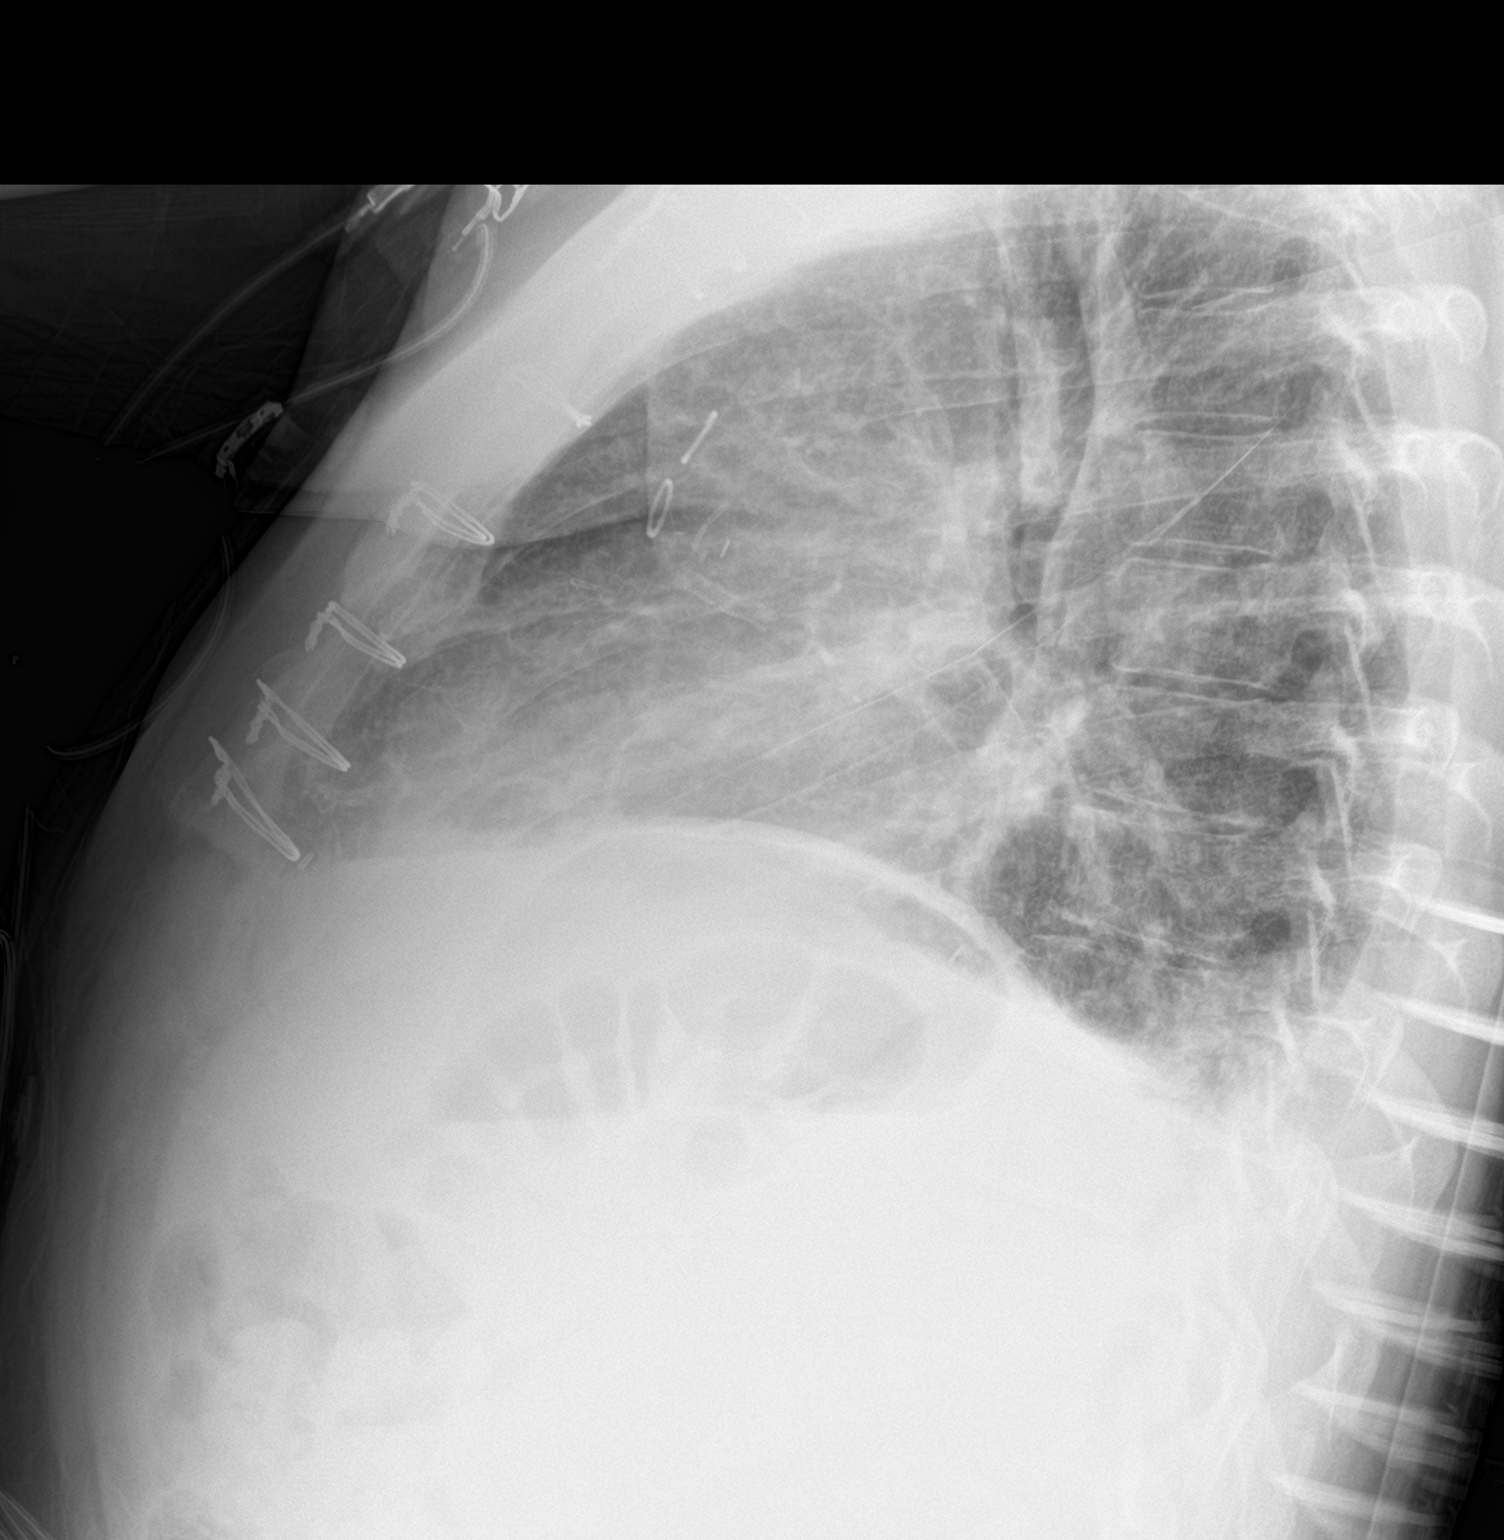

[chest ap]
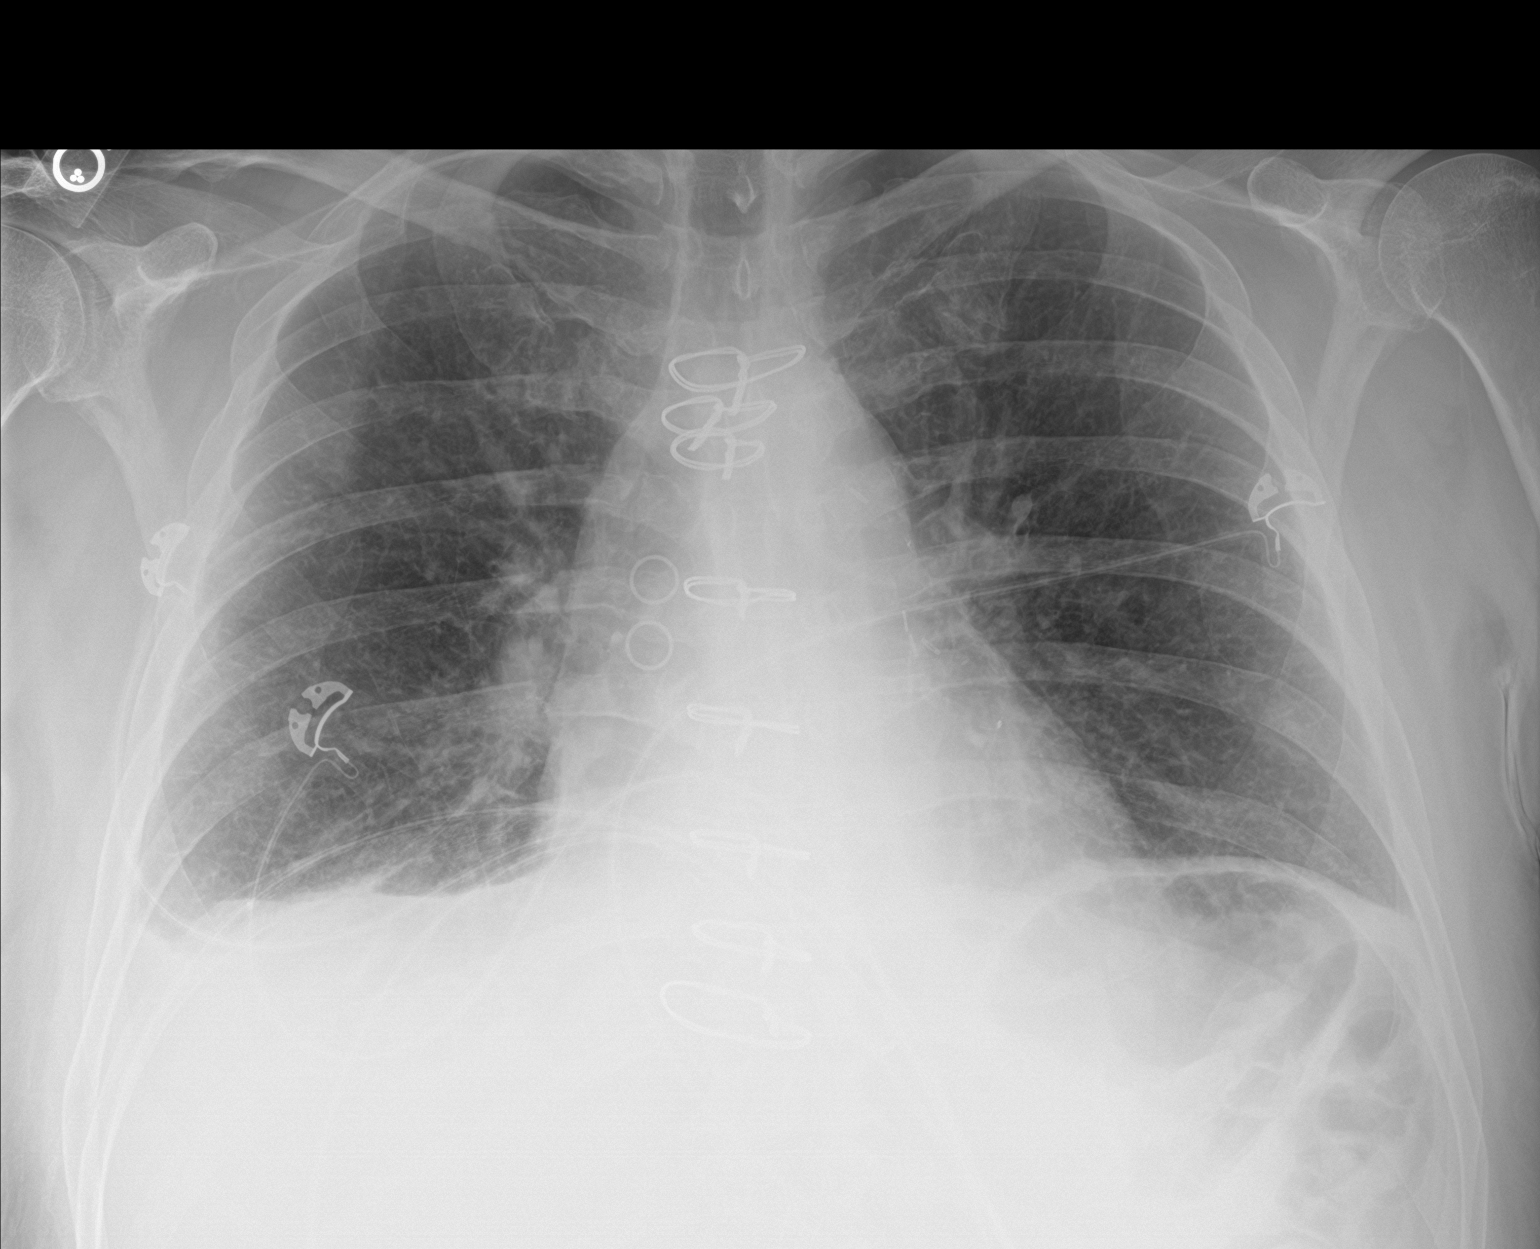

[2 of 2 positions shown; findings below may reference images not displayed]

FINDINGS: Cardiomegaly status post median sternotomy and CABG. Small bilateral
pleural effusions. No acute airspace opacity. Osseous structures are
unremarkable.
IMPRESSION: Cardiomegaly with small bilateral pleural effusions.

## 2021-10-05 MED ORDER — FUROSEMIDE 10 MG/ML IJ SOLN
40.0000 mg | Freq: Once | INTRAMUSCULAR | Status: AC
  Administered 2021-10-05: 40 mg via INTRAVENOUS
  Filled 2021-10-05: qty 4

## 2021-10-05 MED ORDER — TORSEMIDE 20 MG PO TABS: 20.0000 mg | ORAL_TABLET | Freq: Every day | ORAL | 0 refills | Status: DC

## 2021-10-05 NOTE — ED Triage Notes (Signed)
Pt here for shob w/ generalized edema fore "a few days", pt reports dyspnea at rest, feels like he can't take a deep breath. Pt has hx of CHF, has been off lasix x1 month per cardiologist. ?

## 2021-10-05 NOTE — ED Provider Notes (Signed)
[facility] EMERGENCY DEPARTMENT Provider Note   CSN: [id number] Arrival date & time: 10/05/21  1442     History  Chief Complaint  Patient presents with   Shortness of Breath   Leg Swelling    Adam James is a 51 y.o. male.   Shortness of Breath Patient has history of diabetes, dyslipidemia, coronary artery disease, cardiomyopathy, heart failure, MI, chronic kidney disease, CHF Patient presented to ED for evaluation of shortness of breath.  Patient states he has been having issues with increased leg swelling over the last several weeks.  Couple weeks ago he had symptoms of cough congestion and fever.  He also felt like his taste was affected.  Those symptoms resolved but he has had progressive worsening of shortness of breath since then.  Patient feels like whenever he lies flat it is worse.  He does have history of CHF but has not been taking his Lasix for the last month.  Patient states he has never had issues with his breathing before associated with his CHF  Outpatient records reviewed.  It appears an outpatient echocardiogram was ordered and there is a no-show in the chart dated February 2 Last visit in the cardiology office was on December 9 and December 16.  Per records patient was post to be taking Toprol digoxin and he was taken off his torsemide into the next visit, does appear he is post to follow-up the end of January early February and I do not see any cardiology office notes in the records. Home Medications Prior to Admission medications   Medication Sig Start Date End Date Taking? Authorizing Provider  torsemide (DEMADEX) 20 MG tablet Take 1 tablet (20 mg total) by mouth daily. 10/05/21  Yes Dorie Rank, MD  amiodarone (PACERONE) 200 MG tablet Take 1 tablet (200 mg total) by mouth daily. 07/11/21   Rafael Bihari, FNP  aspirin EC 81 MG tablet Take 81 mg by mouth daily. Swallow whole.    [provider]  atorvastatin (LIPITOR) 80 MG  tablet Take 1 tablet (80 mg total) by mouth daily. 07/11/21   [place], Maricela Bo, FNP  digoxin (LANOXIN) 0.125 MG tablet Take 1 tablet (0.125 mg total) by mouth daily. 07/09/21   [place], Maricela Bo, FNP  glucose blood (TRUE METRIX BLOOD GLUCOSE TEST) test strip Use as instructed twice daily 05/12/21   Mayers, Cari S, PA-C  glyBURIDE (DIABETA) 5 MG tablet Take 1 tablet (5 mg total) by mouth 2 (two) times daily with a meal. 08/05/21   Newt Minion, MD  metFORMIN (GLUCOPHAGE) 500 MG tablet Take 1 tablet (500 mg total) by mouth 2 (two) times daily. 05/12/21   Mayers, Cari S, PA-C  metoprolol succinate (TOPROL-XL) 25 MG 24 hr tablet Take 1/2 tablet (12.5 mg total) by mouth at bedtime. 07/09/21   Rafael Bihari, FNP  oxyCODONE-acetaminophen (PERCOCET/ROXICET) 5-325 MG tablet Take 1 tablet by mouth every 4 (four) hours as needed. 08/05/21   Newt Minion, MD  oxyCODONE-acetaminophen (PERCOCET/ROXICET) 5-325 MG tablet Take 1 tablet by mouth every 4 (four) hours as needed. 08/05/21   Newt Minion, MD  TRUEplus Lancets 28G MISC Use as directed twice daily at 8 am and 10 pm. 05/12/21   Mayers, Cari S, PA-C      Allergies    Morphine    Review of Systems   Review of Systems  Respiratory:  Positive for shortness of breath.    Physical Exam Updated Vital Signs BP 117/66  Pulse (!) 104    Temp 98.1 F (36.7 C) (Oral)    Resp 15    SpO2 95%  Physical Exam Vitals and nursing note reviewed.  Constitutional:      General: He is not in acute distress.    Appearance: He is well-developed.  HENT:     Head: Normocephalic and atraumatic.     Right Ear: External ear normal.     Left Ear: External ear normal.  Eyes:     General: No scleral icterus.       Right eye: No discharge.        Left eye: No discharge.     Conjunctiva/sclera: Conjunctivae normal.  Neck:     Trachea: No tracheal deviation.  Cardiovascular:     Rate and Rhythm: Normal rate and regular rhythm.  Pulmonary:     Effort:  Pulmonary effort is normal. No respiratory distress.     Breath sounds: Normal breath sounds. No stridor. No wheezing or rales.  Abdominal:     General: Bowel sounds are normal. There is no distension.     Palpations: Abdomen is soft.     Tenderness: There is no abdominal tenderness. There is no guarding or rebound.  Musculoskeletal:        General: No tenderness or deformity.     Cervical back: Neck supple.     Right lower leg: Edema present.     Comments: Status post BKA left lower extremity  Skin:    General: Skin is warm and dry.     Findings: No rash.  Neurological:     General: No focal deficit present.     Mental Status: He is alert.     Cranial Nerves: No cranial nerve deficit (no facial droop, extraocular movements intact, no slurred speech).     Sensory: No sensory deficit.     Motor: No abnormal muscle tone or seizure activity.     Coordination: Coordination normal.  Psychiatric:        Mood and Affect: Mood normal.    ED Results / Procedures / Treatments   Labs (all labs ordered are listed, but only abnormal results are displayed) Labs Reviewed  RESP PANEL BY RT-PCR (FLU A&B, COVID) ARPGX2 - Abnormal; Notable for the following components:      Result Value   SARS Coronavirus 2 by RT PCR POSITIVE (*)    All other components within normal limits  BASIC METABOLIC PANEL - Abnormal; Notable for the following components:   Glucose, Bld 241 (*)    Creatinine, Ser 1.44 (*)    Calcium 8.3 (*)    GFR, Estimated 60 (*)    All other components within normal limits  CBC - Abnormal; Notable for the following components:   RBC 4.11 (*)    Hemoglobin 11.4 (*)    HCT 37.0 (*)    All other components within normal limits  BRAIN NATRIURETIC PEPTIDE - Abnormal; Notable for the following components:   B Natriuretic Peptide 933.3 (*)    All other components within normal limits  TROPONIN I (HIGH SENSITIVITY) - Abnormal; Notable for the following components:   Troponin I (High  Sensitivity) 35 (*)    All other components within normal limits  TROPONIN I (HIGH SENSITIVITY) - Abnormal; Notable for the following components:   Troponin I (High Sensitivity) 33 (*)    All other components within normal limits    EKG EKG Interpretation  Date/Time:  Sunday October 05 2021 14:53:51 EDT Ventricular  Rate:  105 PR Interval:  136 QRS Duration: 96 QT Interval:  366 QTC Calculation: 483 R Axis:   13 Text Interpretation: Sinus tachycardia Left ventricular hypertrophy with repolarization abnormality ( Cornell product ) Abnormal ECG When compared with ECG of 04-Jul-2021 14:40, No significant change since last tracing Confirmed by Dorie Rank [phone]) on 10/05/2021 3:07:24 PM  Radiology DG Chest 2 View  Result Date: 10/05/2021 CLINICAL DATA:  Shortness of breath, leg swelling EXAM: CHEST - 2 VIEW COMPARISON:  07/07/2021 FINDINGS: Cardiomegaly status post median sternotomy and CABG. Small bilateral pleural effusions. No acute airspace opacity. Osseous structures are unremarkable. IMPRESSION: Cardiomegaly with small bilateral pleural effusions. Electronically Signed   By: Delanna Ahmadi M.D.   On: 10/05/2021 15:57   VAS [place] LOWER EXTREMITY VENOUS (DVT) (7a-7p)  Result Date: 10/05/2021  Lower Venous DVT Study Patient Name:  Adam James  Date of Exam:   10/05/2021 Medical Rec #: [id number]         Accession #:    [id number] Date of Birth: 28-Mar-1971         Patient Gender: M Patient Age:   21 years Exam Location:  [facility] Procedure:      VAS [place] LOWER EXTREMITY VENOUS (DVT) Referring Phys: Wille Glaser Tannisha Kennington --------------------------------------------------------------------------------  Indications: Edema. Other Indications: Patient previously on Lasix. Comparison Study: No prior studies. Performing Technologist: Darlin Coco RDMS, RVT  Examination Guidelines: A complete evaluation includes B-mode imaging, spectral Doppler, color Doppler, and power Doppler as needed of all accessible  portions of each vessel. Bilateral testing is considered an integral part of a complete examination. Limited examinations for reoccurring indications may be performed as noted. The reflux portion of the exam is performed with the patient in reverse Trendelenburg.  +---------+---------------+---------+-----------+----------+--------------+  RIGHT     Compressibility Phasicity Spontaneity Properties Thrombus Aging  +---------+---------------+---------+-----------+----------+--------------+  CFV       Full            Yes       Yes                                    +---------+---------------+---------+-----------+----------+--------------+  SFJ       Full                                                             +---------+---------------+---------+-----------+----------+--------------+  FV Prox   Full                                                             +---------+---------------+---------+-----------+----------+--------------+  FV Mid    Full                                                             +---------+---------------+---------+-----------+----------+--------------+  FV Distal Full                                                             +---------+---------------+---------+-----------+----------+--------------+  PFV       Full                                                             +---------+---------------+---------+-----------+----------+--------------+  POP       Full            Yes       Yes                                    +---------+---------------+---------+-----------+----------+--------------+  PTV       Full                                                             +---------+---------------+---------+-----------+----------+--------------+  PERO      Full                                                             +---------+---------------+---------+-----------+----------+--------------+  Gastroc   Full                                                              +---------+---------------+---------+-----------+----------+--------------+   +----+---------------+---------+-----------+----------+--------------+  LEFT Compressibility Phasicity Spontaneity Properties Thrombus Aging  +----+---------------+---------+-----------+----------+--------------+  CFV  Full            Yes       Yes                                    +----+---------------+---------+-----------+----------+--------------+    Summary: RIGHT: - There is no evidence of deep vein thrombosis in the lower extremity.  - No cystic structure found in the popliteal fossa.  LEFT: - No evidence of common femoral vein obstruction.  *See table(s) above for measurements and observations.    Preliminary     Procedures Procedures    Medications Ordered in ED Medications  furosemide (LASIX) injection 40 mg (40 mg Intravenous Given 10/05/21 1730)    ED Course/ Medical Decision Making/ A&P Clinical Course as of 10/05/21 1831  Sun Oct 05, 2021  1654 DVT study negative [JK]  [id number] Basic metabolic panel(!) Renal function improved since last [JK]  1656 Brain natriuretic peptide(!) BNP increased since last  [JK]  1657 Troponin I (High Sensitivity)(!) TROP increased since last tracing [JK]  1657 DG Chest 2 View X-ray images and radiology report reviewed.  Cardiomegaly with small bilateral pleural effusions [JK]  1742 Discussed with Dr Humphrey Rolls, lasix IV and if he responds well continue with outpatient 40 mg lasix and follow up in clinic 1 week [JK]  1828 Patient is having good  urine output from his Lasix.  Discussed starting on 40 mg of Lasix daily and the patient is concerned about filling that prescription.  He does however have the 20 mg torsemide tablets at home.  He had been taking 40 mg of that.  We will have the patient take 20 mg daily instead of the 40 as the 40 mg contributed to his worsening renal function previously [JK]    Clinical Course User Index [JK] Dorie Rank, MD                            Medical Decision Making Amount and/or Complexity of Data Reviewed Labs: ordered. Decision-making details documented in ED Course. Radiology: ordered. Decision-making details documented in ED Course.  Risk Prescription drug management.   Shortness of breath. Patient presented with complaints of worsening shortness of breath and orthopnea.  Records reviewed and patient does have history of CHF.  He had his diuretics discontinued at his last visit in December because of increasing renal insufficiency.  However patient was supposed to follow-up with an echocardiogram as well as an outpatient visit and he missed those appointments.  Patient does have increasing peripheral edema.  He does have small pleural effusions on chest x-ray.  I suspect his symptoms are related to worsening CHF.  No signs of severe pulmonary edema and he does not have an oxygen requirement .  I have ordered a dose of IV Lasix we will plan on consultation with cardiology to discuss outpatient management with close follow-up versus inpatient treatment.  Patient would prefer to go home if possible  Case was discussed with cardiology patient has responded well to diuresis.  Plan is for discharge and close outpatient follow-up.  Patient did not think he could afford the 40 mg Lasix tablets.  He was on 40 mg of torsemide previously and he has those tablets at home.  We will have him resume at 20 mg.  Chronic kidney disease Appears to be improving with discontinuation of his Lasix.  However he will need to restart diuretics.    Medical noncompliance Patient is not taking all of his cardiology medications regularly.  He states she does not like having to try to get out of the house after his amputation so he has been not taking his medications regularly in order to have to not go back to the doctor.        Final Clinical Impression(s) / ED Diagnoses Final diagnoses:  Acute on chronic congestive heart failure, unspecified heart  failure type ([place])  Chronic kidney disease, unspecified CKD stage    Rx / DC Orders ED Discharge Orders          Ordered    torsemide (DEMADEX) 20 MG tablet  Daily        10/05/21 1828              Dorie Rank, MD 10/05/21 1831

## 2021-10-05 NOTE — Discharge Instructions (Addendum)
Resume taking 20 mg of your torsemide daily.  Call the cardiologist office today to schedule a follow-up appointment.  Make sure to regularly take all your blood pressure medications. ? ?(A new prescription was not sent in since you have it at home already) ?

## 2021-10-05 NOTE — ED Notes (Signed)
Pt verbalizes understanding of discharge instructions. Opportunity for questions and answers were provided. Pt discharged from the ED.   ?

## 2021-10-05 NOTE — Progress Notes (Signed)
Lower extremity venous RT study completed. ? ?Preliminary results relayed to Lynelle Doctor, MD. ? ?See CV Proc for preliminary results report.  ? ?Jean Rosenthal, RDMS, RVT ? ?

## 2021-10-20 ENCOUNTER — Encounter: Payer: Self-pay | Admitting: Orthopedic Surgery

## 2021-10-29 ENCOUNTER — Ambulatory Visit (HOSPITAL_COMMUNITY): Payer: Self-pay

## 2021-10-29 ENCOUNTER — Encounter (HOSPITAL_COMMUNITY): Payer: Self-pay

## 2021-11-04 ENCOUNTER — Encounter (HOSPITAL_COMMUNITY): Payer: Self-pay | Admitting: Emergency Medicine

## 2021-11-04 ENCOUNTER — Inpatient Hospital Stay (HOSPITAL_COMMUNITY)
Admission: EM | Admit: 2021-11-04 | Discharge: 2021-11-08 | DRG: 291 | Disposition: A | Discharge status: Home / Self Care | Payer: Self-pay | Attending: Family Medicine | Admitting: Family Medicine

## 2021-11-04 ENCOUNTER — Emergency Department (HOSPITAL_COMMUNITY): Payer: Self-pay

## 2021-11-04 DIAGNOSIS — E11622 Type 2 diabetes mellitus with other skin ulcer: Secondary | ICD-10-CM

## 2021-11-04 DIAGNOSIS — Z9119 Patient's noncompliance with other medical treatment and regimen due to financial hardship: Secondary | ICD-10-CM

## 2021-11-04 DIAGNOSIS — Z8616 Personal history of COVID-19: Secondary | ICD-10-CM

## 2021-11-04 DIAGNOSIS — S88112A Complete traumatic amputation at level between knee and ankle, left lower leg, initial encounter: Secondary | ICD-10-CM | POA: Diagnosis present

## 2021-11-04 DIAGNOSIS — T501X6A Underdosing of loop [high-ceiling] diuretics, initial encounter: Secondary | ICD-10-CM | POA: Diagnosis present

## 2021-11-04 DIAGNOSIS — E1122 Type 2 diabetes mellitus with diabetic chronic kidney disease: Secondary | ICD-10-CM | POA: Diagnosis present

## 2021-11-04 DIAGNOSIS — E785 Hyperlipidemia, unspecified: Secondary | ICD-10-CM | POA: Diagnosis present

## 2021-11-04 DIAGNOSIS — I471 Supraventricular tachycardia: Secondary | ICD-10-CM | POA: Diagnosis present

## 2021-11-04 DIAGNOSIS — Z89512 Acquired absence of left leg below knee: Secondary | ICD-10-CM

## 2021-11-04 DIAGNOSIS — Z7984 Long term (current) use of oral hypoglycemic drugs: Secondary | ICD-10-CM

## 2021-11-04 DIAGNOSIS — Z9861 Coronary angioplasty status: Secondary | ICD-10-CM

## 2021-11-04 DIAGNOSIS — N1832 Chronic kidney disease, stage 3b: Secondary | ICD-10-CM | POA: Diagnosis present

## 2021-11-04 DIAGNOSIS — I255 Ischemic cardiomyopathy: Secondary | ICD-10-CM | POA: Diagnosis present

## 2021-11-04 DIAGNOSIS — I5023 Acute on chronic systolic (congestive) heart failure: Secondary | ICD-10-CM | POA: Diagnosis present

## 2021-11-04 DIAGNOSIS — N179 Acute kidney failure, unspecified: Secondary | ICD-10-CM

## 2021-11-04 DIAGNOSIS — Z8249 Family history of ischemic heart disease and other diseases of the circulatory system: Secondary | ICD-10-CM

## 2021-11-04 DIAGNOSIS — Z7982 Long term (current) use of aspirin: Secondary | ICD-10-CM

## 2021-11-04 DIAGNOSIS — I2510 Atherosclerotic heart disease of native coronary artery without angina pectoris: Secondary | ICD-10-CM | POA: Diagnosis present

## 2021-11-04 DIAGNOSIS — E1165 Type 2 diabetes mellitus with hyperglycemia: Secondary | ICD-10-CM | POA: Diagnosis present

## 2021-11-04 DIAGNOSIS — N189 Chronic kidney disease, unspecified: Secondary | ICD-10-CM

## 2021-11-04 DIAGNOSIS — E1151 Type 2 diabetes mellitus with diabetic peripheral angiopathy without gangrene: Secondary | ICD-10-CM | POA: Diagnosis present

## 2021-11-04 DIAGNOSIS — T383X6A Underdosing of insulin and oral hypoglycemic [antidiabetic] drugs, initial encounter: Secondary | ICD-10-CM | POA: Diagnosis present

## 2021-11-04 DIAGNOSIS — Z597 Insufficient social insurance and welfare support: Secondary | ICD-10-CM

## 2021-11-04 DIAGNOSIS — E119 Type 2 diabetes mellitus without complications: Secondary | ICD-10-CM

## 2021-11-04 DIAGNOSIS — Z951 Presence of aortocoronary bypass graft: Secondary | ICD-10-CM

## 2021-11-04 DIAGNOSIS — Z8719 Personal history of other diseases of the digestive system: Secondary | ICD-10-CM

## 2021-11-04 DIAGNOSIS — Z87891 Personal history of nicotine dependence: Secondary | ICD-10-CM

## 2021-11-04 DIAGNOSIS — Z79899 Other long term (current) drug therapy: Secondary | ICD-10-CM

## 2021-11-04 DIAGNOSIS — I872 Venous insufficiency (chronic) (peripheral): Secondary | ICD-10-CM | POA: Diagnosis present

## 2021-11-04 DIAGNOSIS — I130 Hypertensive heart and chronic kidney disease with heart failure and stage 1 through stage 4 chronic kidney disease, or unspecified chronic kidney disease: Principal | ICD-10-CM | POA: Diagnosis present

## 2021-11-04 DIAGNOSIS — I252 Old myocardial infarction: Secondary | ICD-10-CM

## 2021-11-04 HISTORY — DX: Acute on chronic systolic (congestive) heart failure: I50.23

## 2021-11-04 LAB — TROPONIN I (HIGH SENSITIVITY)
Troponin I (High Sensitivity): 27 ng/L — ABNORMAL HIGH (ref <18)
Troponin I (High Sensitivity): 27 ng/L — ABNORMAL HIGH (ref <18)

## 2021-11-04 LAB — CBC WITH DIFFERENTIAL/PLATELET
Abs Immature Granulocytes: 0.03 10*3/uL (ref 0.00–0.07)
Basophils Absolute: 0.1 10*3/uL (ref 0.0–0.1)
Basophils Relative: 1 %
Eosinophils Absolute: 0.2 10*3/uL (ref 0.0–0.5)
Eosinophils Relative: 2 %
HCT: 34.1 % — ABNORMAL LOW (ref 39.0–52.0)
Hemoglobin: 10.9 g/dL — ABNORMAL LOW (ref 13.0–17.0)
Immature Granulocytes: 0 %
Lymphocytes Relative: 29 %
Lymphs Abs: 2.2 10*3/uL (ref 0.7–4.0)
MCH: 27.9 pg (ref 26.0–34.0)
MCHC: 32 g/dL (ref 30.0–36.0)
MCV: 87.4 fL (ref 80.0–100.0)
Monocytes Absolute: 0.4 10*3/uL (ref 0.1–1.0)
Monocytes Relative: 5 %
Neutro Abs: 4.7 10*3/uL (ref 1.7–7.7)
Neutrophils Relative %: 63 %
Platelets: 260 10*3/uL (ref 150–400)
RBC: 3.9 MIL/uL — ABNORMAL LOW (ref 4.22–5.81)
RDW: 14.5 % (ref 11.5–15.5)
WBC: 7.5 10*3/uL (ref 4.0–10.5)
nRBC: 0 % (ref 0.0–0.2)

## 2021-11-04 LAB — COMPREHENSIVE METABOLIC PANEL
ALT: 10 U/L (ref 0–44)
AST: 13 U/L — ABNORMAL LOW (ref 15–41)
Albumin: 2.4 g/dL — ABNORMAL LOW (ref 3.5–5.0)
Alkaline Phosphatase: 81 U/L (ref 38–126)
Anion gap: 7 (ref 5–15)
BUN: 13 mg/dL (ref 6–20)
CO2: 27 mmol/L (ref 22–32)
Calcium: 8.4 mg/dL — ABNORMAL LOW (ref 8.9–10.3)
Chloride: 107 mmol/L (ref 98–111)
Creatinine, Ser: 1.59 mg/dL — ABNORMAL HIGH (ref 0.61–1.24)
GFR, Estimated: 53 mL/min — ABNORMAL LOW (ref >60)
Glucose, Bld: 266 mg/dL — ABNORMAL HIGH (ref 70–99)
Potassium: 3.9 mmol/L (ref 3.5–5.1)
Sodium: 141 mmol/L (ref 135–145)
Total Bilirubin: 0.7 mg/dL (ref 0.3–1.2)
Total Protein: 5.4 g/dL — ABNORMAL LOW (ref 6.5–8.1)

## 2021-11-04 LAB — CBG MONITORING, ED: Glucose-Capillary: 269 mg/dL — ABNORMAL HIGH (ref 70–99)

## 2021-11-04 LAB — RESP PANEL BY RT-PCR (FLU A&B, COVID) ARPGX2
Influenza A by PCR: NEGATIVE
Influenza B by PCR: NEGATIVE
SARS Coronavirus 2 by RT PCR: NEGATIVE

## 2021-11-04 LAB — BRAIN NATRIURETIC PEPTIDE: B Natriuretic Peptide: 1131.2 pg/mL — ABNORMAL HIGH (ref 0.0–100.0)

## 2021-11-04 IMAGING — DX DG CHEST 2V
2 series · 2 of 2 positions shown · non-contrast
Comparison: [DATE]

CLINICAL DATA: Shortness of breath.

EXAM:
CHEST - 2 VIEW

[chest lat]
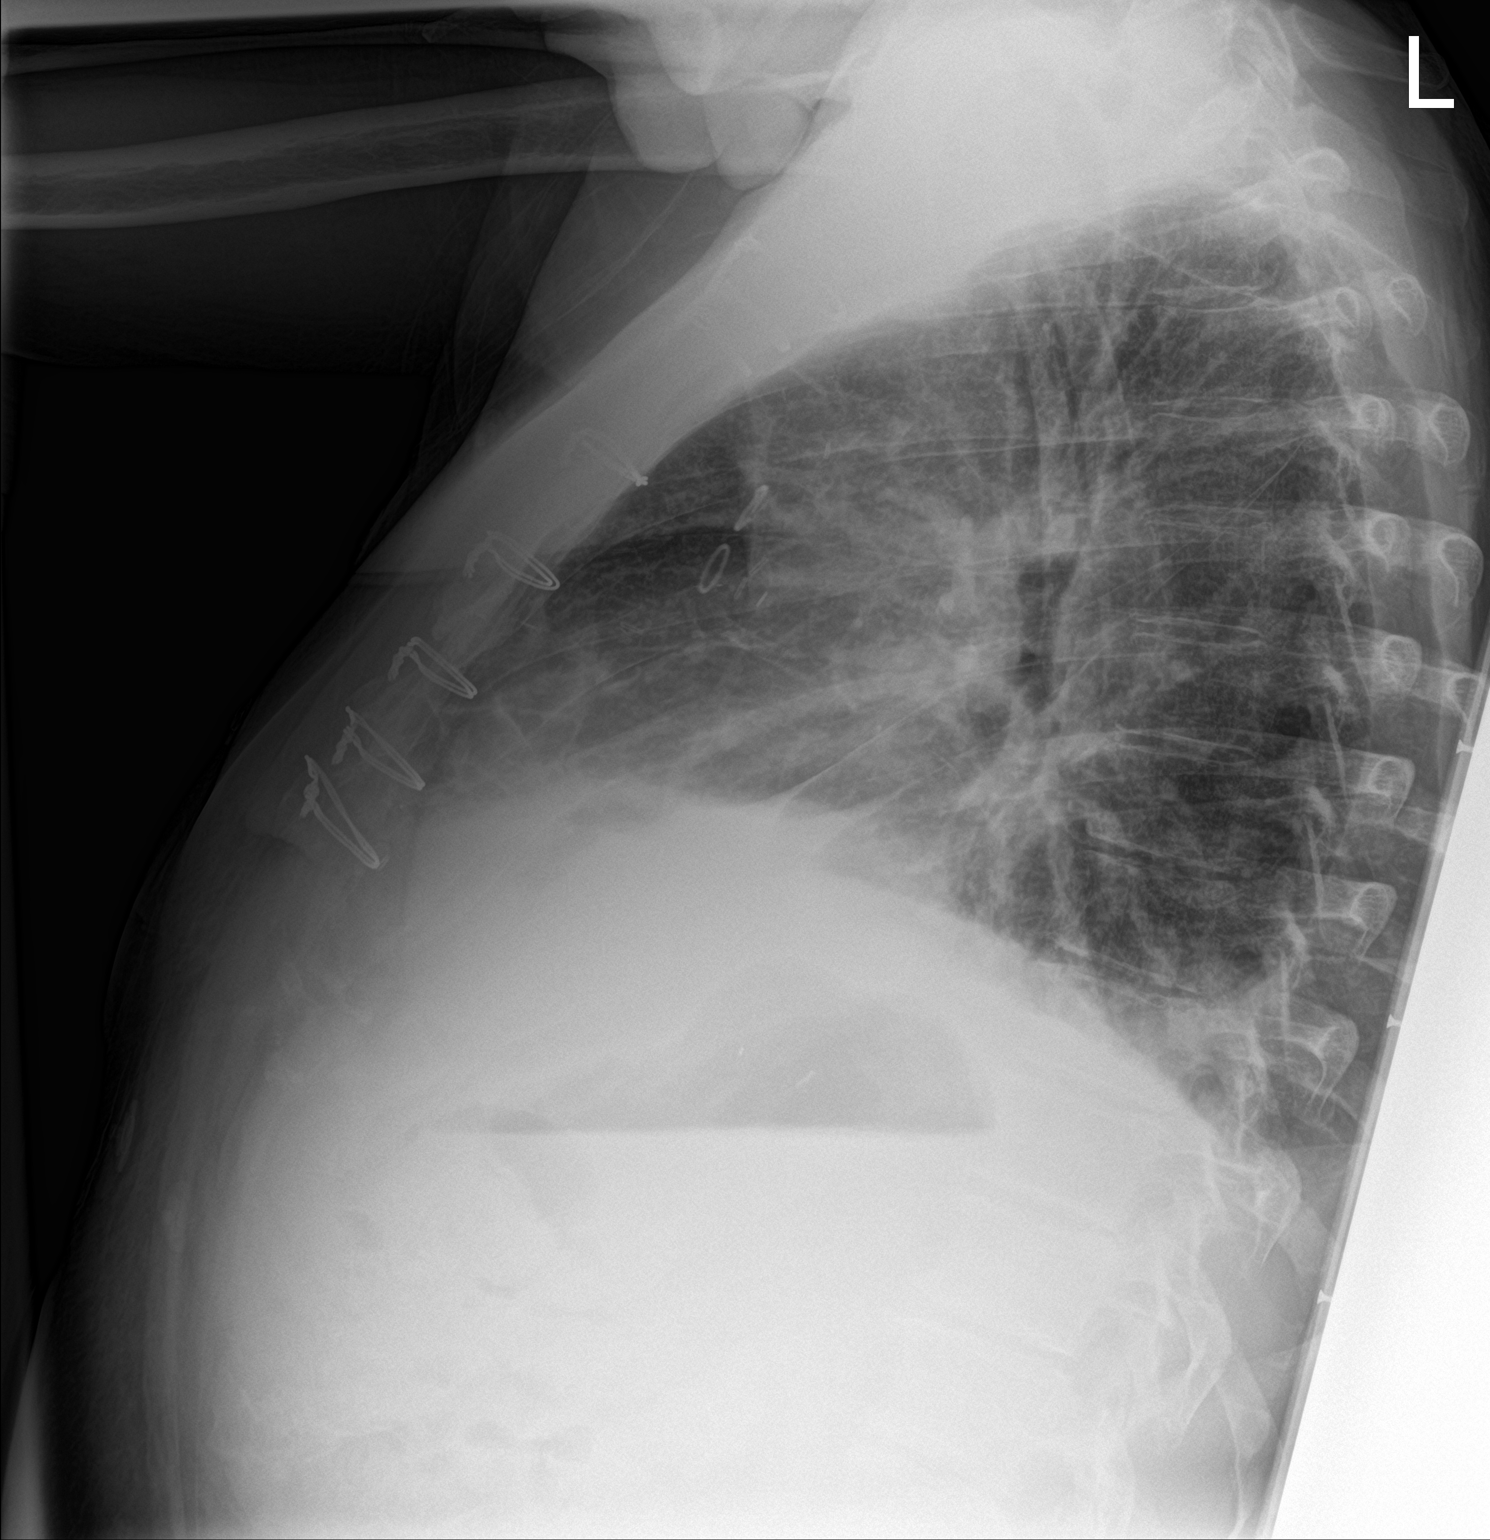

[chest ap]
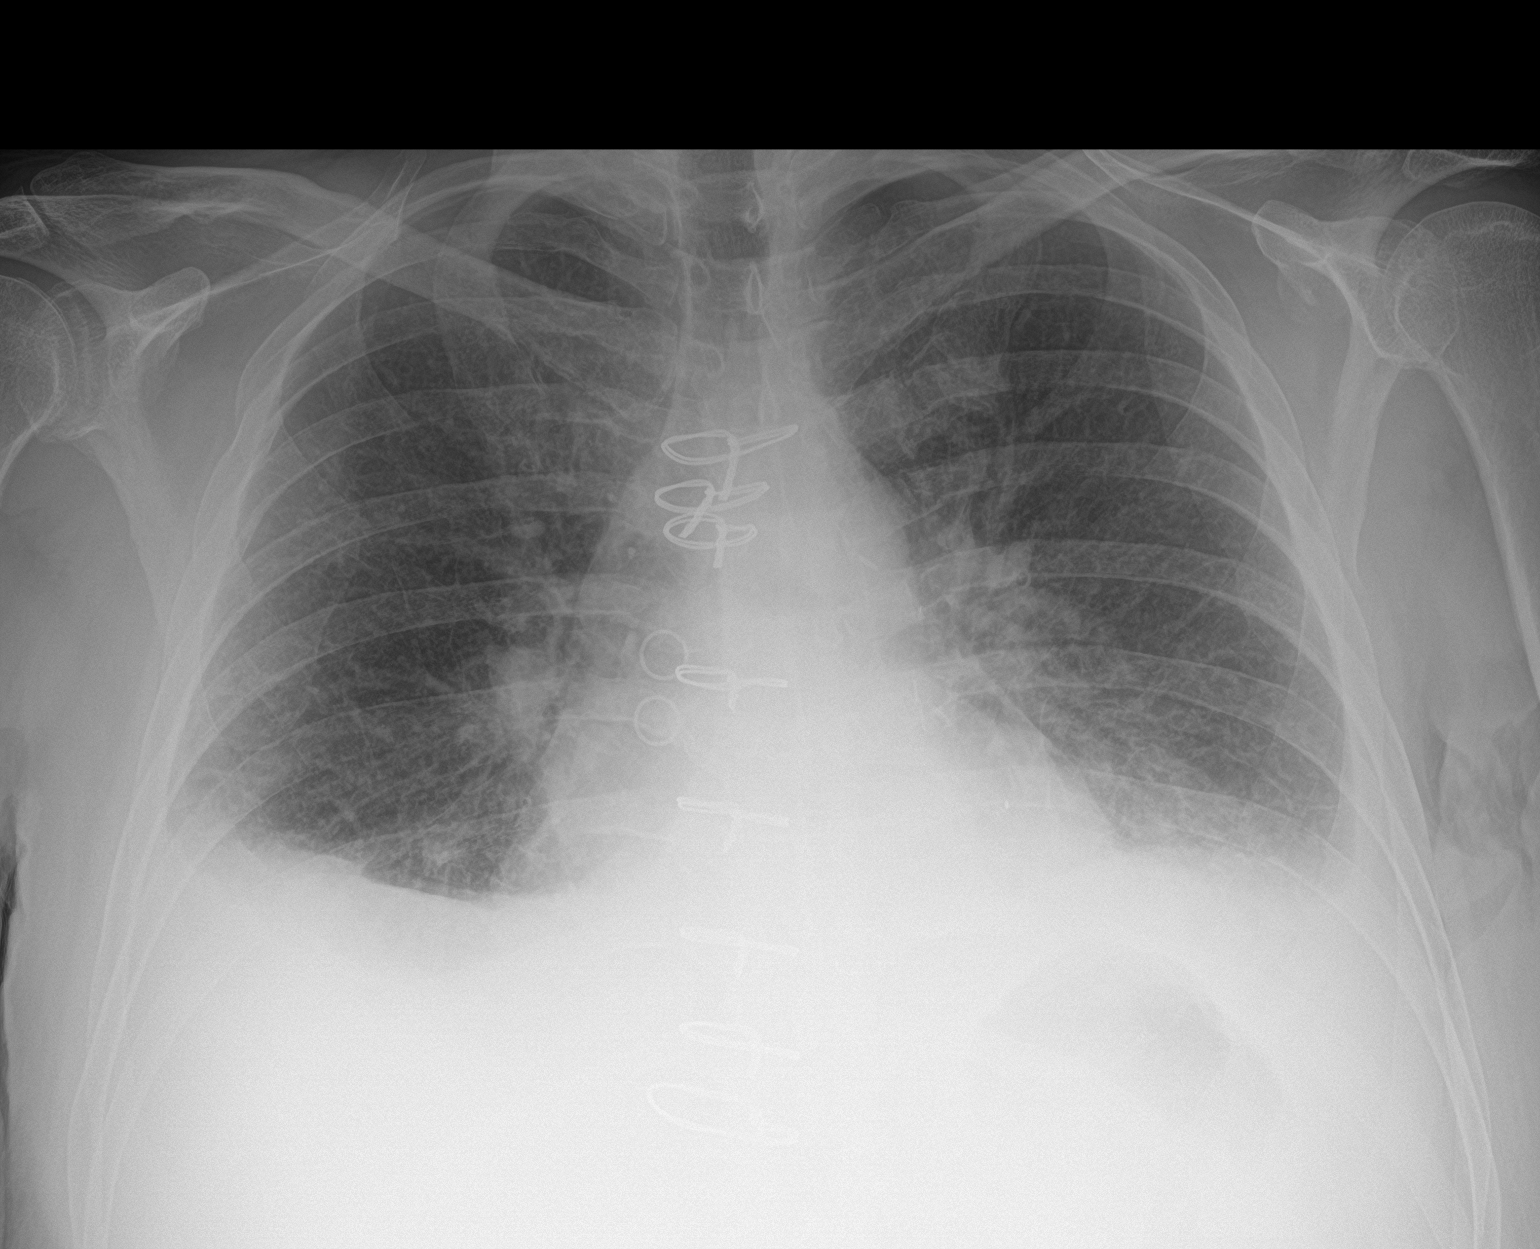

[2 of 2 positions shown; findings below may reference images not displayed]

FINDINGS: The cardiac silhouette, mediastinal and hilar contours are within
normal limits and stable. Stable surgical changes from bypass
surgery.

Increased interstitial markings and mild peribronchial thickening
suggesting interstitial pulmonary edema. There are also small
bilateral pleural effusions. No pulmonary infiltrates or
pneumothorax. The bony thorax is intact.
IMPRESSION: Interstitial pulmonary edema and bilateral pleural effusions.

## 2021-11-04 MED ORDER — IPRATROPIUM-ALBUTEROL 0.5-2.5 (3) MG/3ML IN SOLN
3.0000 mL | Freq: Once | RESPIRATORY_TRACT | Status: AC
  Administered 2021-11-04: 3 mL via RESPIRATORY_TRACT
  Filled 2021-11-04: qty 3

## 2021-11-04 MED ORDER — ATORVASTATIN CALCIUM 80 MG PO TABS
80.0000 mg | ORAL_TABLET | Freq: Every day | ORAL | Status: DC
  Administered 2021-11-05 – 2021-11-08 (×4): 80 mg via ORAL
  Filled 2021-11-04 (×3): qty 1
  Filled 2021-11-04: qty 2

## 2021-11-04 MED ORDER — FUROSEMIDE 10 MG/ML IJ SOLN
60.0000 mg | Freq: Two times a day (BID) | INTRAMUSCULAR | Status: DC
  Administered 2021-11-05 – 2021-11-07 (×5): 60 mg via INTRAVENOUS
  Filled 2021-11-04 (×5): qty 6

## 2021-11-04 MED ORDER — INSULIN ASPART 100 UNIT/ML IJ SOLN
0.0000 [IU] | Freq: Three times a day (TID) | INTRAMUSCULAR | Status: DC
  Administered 2021-11-05: 3 [IU] via SUBCUTANEOUS
  Administered 2021-11-05: 5 [IU] via SUBCUTANEOUS
  Administered 2021-11-05: 8 [IU] via SUBCUTANEOUS
  Administered 2021-11-06: 3 [IU] via SUBCUTANEOUS
  Administered 2021-11-06 – 2021-11-07 (×3): 2 [IU] via SUBCUTANEOUS
  Administered 2021-11-07 (×2): 3 [IU] via SUBCUTANEOUS
  Administered 2021-11-08: 2 [IU] via SUBCUTANEOUS
  Administered 2021-11-08: 5 [IU] via SUBCUTANEOUS

## 2021-11-04 MED ORDER — ENOXAPARIN SODIUM 40 MG/0.4ML IJ SOSY
40.0000 mg | PREFILLED_SYRINGE | INTRAMUSCULAR | Status: DC
  Administered 2021-11-04 – 2021-11-07 (×4): 40 mg via SUBCUTANEOUS
  Filled 2021-11-04 (×4): qty 0.4

## 2021-11-04 MED ORDER — FUROSEMIDE 10 MG/ML IJ SOLN
40.0000 mg | Freq: Once | INTRAMUSCULAR | Status: AC
  Administered 2021-11-04: 40 mg via INTRAVENOUS
  Filled 2021-11-04: qty 4

## 2021-11-04 NOTE — Assessment & Plan Note (Signed)
Continue statin. 

## 2021-11-04 NOTE — Assessment & Plan Note (Signed)
Stable and asymptomatic.  Troponin is flat at 27x2.  No significant findings on EKG. ?

## 2021-11-04 NOTE — ED Provider Notes (Signed)
?[address] ?Provider Note ? ? ?CSN: [id number] ?Arrival date & time: 11/04/21  1328 ? ?  ? ?History ? ?Chief Complaint  ?Patient presents with  ? Leg Swelling  ? ? ?Adam James is a 51 y.o. male.  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 ? ?Patient states that over the past 3 weeks he has had progressively worsening shortness of breath.  States that over the past 1 month he has had progressive lower extremity swelling.  Endorses worsening PND and orthopnea which is interfering with his sleep.  He states that he has been getting about 2 hours of sleep at night due to his orthopnea.  He states that he was "seen last month for same."  He states that at an appointment he was told to stop taking his torsemide due to his renal function.  He states that he stopped this for 1 month, but was only supposed to stop for 2 weeks. States since restarting his medication has continued to have lower extremity swelling and shortness of breath. He does not wear home O2. Denies any fever or productive cough. Denies chest pain, palpitations, lightheadedness.  ? ?On chart review, appears patient was seen in December by Cardiology and told to stop his Torsemide. He was to restart this at a follow-up appointment but do not see any appointment since. He was admitted in January for LLE BKA. Appears he was no-show to echo in February. Presented to the ED on 3/12 with almost identical complaint as today. He was given 40 IV Lasix. Team consulted cardiology who requested OP f/u appointment which does not appear to have happened. Patient states he has been taking 40mg  PO Lasix daily since most recent ER visit with worsening symptoms.  ? ?HPI ? ?  ? ?Home Medications ?Prior to Admission medications   ?Medication Sig Start Date End  Date Taking? Authorizing Provider  ?amiodarone (PACERONE) 200 MG tablet Take 1 tablet (200 mg total) by mouth daily. 07/11/21   Rafael Bihari, FNP  ?aspirin EC 81 MG tablet Take 81 mg by mouth daily. Swallow whole.    [provider]  ?atorvastatin (LIPITOR) 80 MG tablet Take 1 tablet (80 mg total) by mouth daily. 07/11/21   Rafael Bihari, FNP  ?digoxin (LANOXIN) 0.125 MG tablet Take 1 tablet (0.125 mg total) by mouth daily. 07/09/21   Rafael Bihari, FNP  ?glucose blood (TRUE METRIX BLOOD GLUCOSE TEST) test strip Use as instructed twice daily 05/12/21   Mayers, Cari S, PA-C  ?glyBURIDE (DIABETA) 5 MG tablet Take 1 tablet (5 mg total) by mouth 2 (two) times daily with a meal. 08/05/21   Newt Minion, MD  ?metFORMIN (GLUCOPHAGE) 500 MG tablet Take 1 tablet (500 mg total) by mouth 2 (two) times daily. 05/12/21   Mayers, Cari S, PA-C  ?metoprolol succinate (TOPROL-XL) 25 MG 24 hr tablet Take 1/2 tablet (12.5 mg total) by mouth at bedtime. 07/09/21   Rafael Bihari, FNP  ?oxyCODONE-acetaminophen (PERCOCET/ROXICET) 5-325 MG tablet Take 1 tablet by mouth every 4 (four) hours as needed. 08/05/21   Newt Minion, MD  ?oxyCODONE-acetaminophen (PERCOCET/ROXICET) 5-325 MG tablet Take 1 tablet by mouth every 4 (four) hours as needed. 08/05/21   Newt Minion, MD  ?torsemide (DEMADEX) 20 MG tablet Take 1 tablet (  20 mg total) by mouth daily. 10/05/21   Dorie Rank, MD  ?TRUEplus Lancets 28G MISC Use as directed twice daily at 8 am and 10 pm. 05/12/21   Mayers, Loraine Grip, PA-C  ?   ? ?Allergies    ?Morphine   ? ?Review of Systems   ?Review of Systems  ?Constitutional:  Negative for diaphoresis and fever.  ?Respiratory:  Positive for shortness of breath and wheezing. Negative for cough.   ?Cardiovascular:  Positive for leg swelling.  ?Gastrointestinal:  Negative for nausea.  ?Neurological:  Negative for dizziness and light-headedness.  ?All other systems reviewed and are negative. ? ?Physical Exam ?Updated  Vital Signs ?BP (!) 148/101 (BP Location: Right Arm)   Pulse 100   Temp 99 ?F (37.2 ?C) (Oral)   Resp 20   SpO2 95%  ?Physical Exam ?Vitals and nursing note reviewed.  ?Constitutional:   ?   General: He is not in acute distress. ?   Appearance: Normal appearance. He is obese. He is ill-appearing. He is not toxic-appearing.  ?HENT:  ?   Head: Normocephalic and atraumatic.  ?   Nose: Nose normal.  ?   Mouth/Throat:  ?   Mouth: Mucous membranes are moist.  ?   Pharynx: Oropharynx is clear.  ?Eyes:  ?   General: No scleral icterus. ?   Extraocular Movements: Extraocular movements intact.  ?   Pupils: Pupils are equal, round, and reactive to light.  ?Cardiovascular:  ?   Rate and Rhythm: Regular rhythm. Tachycardia present.  ?   Pulses:     ?     Radial pulses are 2+ on the right side and 2+ on the left side.  ?      Left popliteal pulse not accessible.  ?     Dorsalis pedis pulses are 1+ on the right side.  ?   Heart sounds: Normal heart sounds. No murmur heard. ?Pulmonary:  ?   Effort: No respiratory distress.  ?   Breath sounds: Wheezing and rales present.  ?Chest:  ?   Chest wall: No tenderness.  ?Abdominal:  ?   General: Bowel sounds are normal. There is no distension.  ?   Palpations: Abdomen is soft.  ?   Tenderness: There is no abdominal tenderness.  ?Musculoskeletal:     ?   General: No tenderness. Normal range of motion.  ?   Cervical back: Neck supple.  ?   Right lower leg: 2+ Pitting Edema present.  ?   Left lower leg: 2+ Pitting Edema present.  ?   Left Lower Extremity: Left leg is amputated below knee.  ?Skin: ?   General: Skin is warm and dry.  ?   Capillary Refill: Capillary refill takes less than 2 seconds.  ?Neurological:  ?   General: No focal deficit present.  ?   Mental Status: He is alert and oriented to person, place, and time. Mental status is at baseline.  ?Psychiatric:     ?   Mood and Affect: Mood normal.     ?   Behavior: Behavior normal.     ?   Thought Content: Thought content normal.      ?   Judgment: Judgment normal.  ? ? ?ED Results / Procedures / Treatments   ?Labs ?(all labs ordered are listed, but only abnormal results are displayed) ?Labs Reviewed  ?COMPREHENSIVE METABOLIC PANEL - Abnormal; Notable for the following components:  ?    Result Value  ? Glucose, Bld  266 (*)   ? Creatinine, Ser 1.59 (*)   ? Calcium 8.4 (*)   ? Total Protein 5.4 (*)   ? Albumin 2.4 (*)   ? AST 13 (*)   ? GFR, Estimated 53 (*)   ? All other components within normal limits  ?BRAIN NATRIURETIC PEPTIDE - Abnormal; Notable for the following components:  ? B Natriuretic Peptide 1,131.2 (*)   ? All other components within normal limits  ?CBC WITH DIFFERENTIAL/PLATELET - Abnormal; Notable for the following components:  ? RBC 3.90 (*)   ? Hemoglobin 10.9 (*)   ? HCT 34.1 (*)   ? All other components within normal limits  ?TROPONIN I (HIGH SENSITIVITY) - Abnormal; Notable for the following components:  ? Troponin I (High Sensitivity) 27 (*)   ? All other components within normal limits  ?TROPONIN I (HIGH SENSITIVITY) - Abnormal; Notable for the following components:  ? Troponin I (High Sensitivity) 27 (*)   ? All other components within normal limits  ?RESP PANEL BY RT-PCR (FLU A&B, COVID) ARPGX2  ? ? ?EKG ?None ? ?Radiology ?DG Chest 2 View ? ?Result Date: 11/04/2021 ?CLINICAL DATA:  Shortness of breath. EXAM: CHEST - 2 VIEW COMPARISON:  10/05/2021 FINDINGS: The cardiac silhouette, mediastinal and hilar contours are within normal limits and stable. Stable surgical changes from bypass surgery. Increased interstitial markings and mild peribronchial thickening suggesting interstitial pulmonary edema. There are also small bilateral pleural effusions. No pulmonary infiltrates or pneumothorax. The bony thorax is intact. IMPRESSION: Interstitial pulmonary edema and bilateral pleural effusions. Electronically Signed   By: Marijo Sanes M.D.   On: 11/04/2021 15:13   ? ?Procedures ?Procedures  ? ?Medications Ordered in ED ?Medications   ?furosemide (LASIX) injection 60 mg (has no administration in time range)  ?ipratropium-albuterol (DUONEB) 0.5-2.5 (3) MG/3ML nebulizer solution 3 mL (3 mLs Nebulization Given 11/04/21 1735)  ?furosemide

## 2021-11-04 NOTE — ED Notes (Signed)
Pt is in room 36 & [place] is at bedside.  ?

## 2021-11-04 NOTE — ED Triage Notes (Signed)
Patient here after discharge one month ago for CHF exacerbation, patient complains of lower extremity edema and shortness of breath over the last three weeks. Patient is alert, oriented, and in no apparen tdistress at this time. ?

## 2021-11-04 NOTE — H&P (Signed)
?History and Physical  ? ? ?Patient: Adam James [id number] DOB: [birth year] ?DOA: 11/04/2021 ?DOS: the patient was seen and examined on 11/04/2021 ?PCP: Pcp, No  ?Patient coming from: Home ? ?Chief Complaint:  ?Chief Complaint  ?Patient presents with  ? Leg Swelling  ? ?HPI: Boy Fabry is a 51 y.o. male with medical history significant of CABG x3 in 04/2021, ischemic cardiomyopathy, hypertension, hyperlipidemia, type 2 diabetes, CKD stage III and recent left BKA in 07/2021 who presents with concerns of increasing shortness of breath, orthopnea and lower extremity edema. ? ?Patient reports symptoms for the past month.  Earlier in February he had issues with worsening renal insufficiency and was told by cardiology to hold his Lasix.  However there was a misunderstanding and he held Lasix for longer than intended.  He presented to the ED with dyspnea and lower extremity edema on 3/12 and was given IV Lasix and sent home to continue on torsemide 20 mg daily. ? ?However, since then continued to have increasing shortness of breath with orthopnea and paroxysmal nocturnal dyspnea.  He is not able to weigh himself at home since he is unable to stand with recent left BKA in January. ? ?In the ED, he was afebrile and normotensive on room air.  No leukocytosis, hemoglobin of 10.9.  Creatinine is mildly elevated at 1.56 from a prior of 1.44 a month ago. ?BMP of 1136, troponin flat at 27 ?EKG on my review shows left axis deviation, LVH with prolonged QT C of 502 and nonspecific T wave inversions in V5 to V6 ? ?Chest x-ray shows interstitial pulmonary edema and bilateral pleural effusion. ? ?Cardiology was consulted by ED PA and has evaluated patient and recommends BID IV Lasix and hospitalist admission.  ? ?review of Systems: As mentioned in the history of present illness. All other systems reviewed and are negative. ?Past Medical History:  ?Diagnosis Date  ? Anemia   ? GI bleed; 03/13/11 EGD: relux esophagitis with  ulceration/clot  ? CAD (coronary artery disease)   ? Cardiomyopathy ([place])   ? CHF (congestive heart failure) ([place])   ? CKD (chronic kidney disease)   ? Diabetic foot ulcer ([place])   ? Dyslipidemia   ? Dyspnea   ? Heart failure ([place])   ? Hypertension   ? Myocardial infarction [facility])   ? Type 2 diabetes mellitus ([place])   ? ?Past Surgical History:  ?Procedure Laterality Date  ? AMPUTATION Left 04/30/2021  ? Procedure: LEFT FOOT 5TH RAY AMPUTATION APPLICATION OF A-CELL POWDER;  Surgeon: Newt Minion, MD;  Location: [place];  Service: Orthopedics;  Laterality: Left;  ? AMPUTATION Left 07/30/2021  ? Procedure: LEFT BELOW KNEE AMPUTATION;  Surgeon: Newt Minion, MD;  Location: [place];  Service: Orthopedics;  Laterality: Left;  ? APPENDECTOMY    ? APPLICATION OF WOUND VAC Left 03/03/2021  ? Procedure: APPLICATION OF WOUND VAC;  Surgeon: Leandrew Koyanagi, MD;  Location: [place];  Service: Orthopedics;  Laterality: Left;  ? APPLICATION OF WOUND VAC  03/07/2021  ? Procedure: APPLICATION OF WOUND VAC;  Surgeon: Newt Minion, MD;  Location: [place];  Service: Orthopedics;;  ? APPLICATION OF WOUND VAC  07/30/2021  ? Procedure: APPLICATION OF WOUND VAC;  Surgeon: Newt Minion, MD;  Location: [place];  Service: Orthopedics;;  ? CARDIAC CATHETERIZATION    ? COLON SURGERY    ? CORONARY ARTERY BYPASS GRAFT N/A 05/22/2021  ? Procedure: CORONARY ARTERY BYPASS GRAFTING (CABG) X 3 USING LEFT INTERNAL  MAMMARY ARTERY AND RIGHT ENDOSCOPIC GREATER SAPHENOUS VEIN CONDUITS;  Surgeon: Lajuana Matte, MD;  Location: [place];  Service: Open Heart Surgery;  Laterality: N/A;  ? ENDOVEIN HARVEST OF GREATER SAPHENOUS VEIN Right 05/22/2021  ? Procedure: ENDOVEIN HARVEST OF GREATER SAPHENOUS VEIN;  Surgeon: Lajuana Matte, MD;  Location: [place];  Service: Open Heart Surgery;  Laterality: Right;  ? ESOPHAGOGASTRODUODENOSCOPY (EGD) WITH PROPOFOL N/A 03/12/2021  ? Procedure: ESOPHAGOGASTRODUODENOSCOPY (EGD) WITH PROPOFOL;  Surgeon: Daryel November, MD;   Location: [place];  Service: Gastroenterology;  Laterality: N/A;  ? I & D EXTREMITY Left 02/24/2021  ? Procedure: IRRIGATION AND DEBRIDEMENT EXTREMITY;  Surgeon: Altamese Timberon, MD;  Location: [place];  Service: Orthopedics;  Laterality: Left;  ? I & D EXTREMITY Left 02/26/2021  ? Procedure: IRRIGATION AND DEBRIDEMENT OF LEG;  Surgeon: Newt Minion, MD;  Location: [place];  Service: Orthopedics;  Laterality: Left;  ? I & D EXTREMITY Left 03/03/2021  ? Procedure: LEFT LEG IRRIGATION AND DEBRIDEMENT;  Surgeon: Leandrew Koyanagi, MD;  Location: [place];  Service: Orthopedics;  Laterality: Left;  ? I & D EXTREMITY Left 03/07/2021  ? Procedure: REPEAT DEBRIDEMENT LEFT LEG;  Surgeon: Newt Minion, MD;  Location: [place];  Service: Orthopedics;  Laterality: Left;  ? IR FLUORO GUIDE CV LINE RIGHT  03/10/2021  ? IR REMOVAL TUN CV CATH W/O FL  03/24/2021  ? IR US GUIDE VASC ACCESS RIGHT  03/10/2021  ? LEFT HEART CATH AND CORONARY ANGIOGRAPHY N/A 09/13/2020  ? Procedure: LEFT HEART CATH AND CORONARY ANGIOGRAPHY;  Surgeon: Troy Sine, MD;  Location: [place] [facility];  Service: Cardiovascular;  Laterality: N/A;  ? LEFT HEART CATH AND CORONARY ANGIOGRAPHY N/A 03/28/2021  ? Procedure: LEFT HEART CATH AND CORONARY ANGIOGRAPHY;  Surgeon: Larey Dresser, MD;  Location: [place] [facility];  Service: Cardiovascular;  Laterality: N/A;  ? RIGHT HEART CATH N/A 03/26/2021  ? Procedure: RIGHT HEART CATH;  Surgeon: Larey Dresser, MD;  Location: [place] [facility];  Service: Cardiovascular;  Laterality: N/A;  ? TEE WITHOUT CARDIOVERSION N/A 05/22/2021  ? Procedure: TRANSESOPHAGEAL ECHOCARDIOGRAM (TEE);  Surgeon: Lajuana Matte, MD;  Location: [place];  Service: Open Heart Surgery;  Laterality: N/A;  ? TOE AMPUTATION  2020  ? ?Social History:  reports that he quit smoking about 14 months ago. His smoking use included cigarettes. He has a 108.00 pack-year smoking history. He has never used smokeless tobacco. He reports that he does not  currently use alcohol. He reports that he does not currently use drugs after having used the following drugs: Marijuana. ? ?Allergies  ?Allergen Reactions  ? Morphine Itching  ? ? ?Family History  ?Problem Relation Age of Onset  ? Hypertension Mother   ? Heart failure Father   ? Hypertension Brother   ? Congestive Heart Failure Brother   ? ? ?Prior to Admission medications   ?Medication Sig Start Date End Date Taking? Authorizing Provider  ?amiodarone (PACERONE) 200 MG tablet Take 1 tablet (200 mg total) by mouth daily. 07/11/21   Rafael Bihari, FNP  ?aspirin EC 81 MG tablet Take 81 mg by mouth daily. Swallow whole.    [provider]  ?atorvastatin (LIPITOR) 80 MG tablet Take 1 tablet (80 mg total) by mouth daily. 07/11/21   Rafael Bihari, FNP  ?digoxin (LANOXIN) 0.125 MG tablet Take 1 tablet (0.125 mg total) by mouth daily. 07/09/21   Rafael Bihari, FNP  ?  glucose blood (TRUE METRIX BLOOD GLUCOSE TEST) test strip Use as instructed twice daily 05/12/21   Mayers, Cari S, PA-C  ?glyBURIDE (DIABETA) 5 MG tablet Take 1 tablet (5 mg total) by mouth 2 (two) times daily with a meal. 08/05/21   Newt Minion, MD  ?metFORMIN (GLUCOPHAGE) 500 MG tablet Take 1 tablet (500 mg total) by mouth 2 (two) times daily. 05/12/21   Mayers, Cari S, PA-C  ?metoprolol succinate (TOPROL-XL) 25 MG 24 hr tablet Take 1/2 tablet (12.5 mg total) by mouth at bedtime. 07/09/21   Rafael Bihari, FNP  ?oxyCODONE-acetaminophen (PERCOCET/ROXICET) 5-325 MG tablet Take 1 tablet by mouth every 4 (four) hours as needed. 08/05/21   Newt Minion, MD  ?oxyCODONE-acetaminophen (PERCOCET/ROXICET) 5-325 MG tablet Take 1 tablet by mouth every 4 (four) hours as needed. 08/05/21   Newt Minion, MD  ?torsemide (DEMADEX) 20 MG tablet Take 1 tablet (20 mg total) by mouth daily. 10/05/21   Dorie Rank, MD  ?TRUEplus Lancets 28G MISC Use as directed twice daily at 8 am and 10 pm. 05/12/21   Mayers, Loraine Grip, PA-C  ? ? ?Physical Exam: ?Vitals:   ? 11/04/21 1357 11/04/21 1610  ?BP: 131/88 (!) 148/101  ?Pulse: 97 100  ?Resp: 20 20  ?Temp: 99 ?F (37.2 ?C) 99 ?F (37.2 ?C)  ?TempSrc: Oral Oral  ?SpO2: 98% 95%  ? ?Constitutional: NAD, middle-age male sitting uprig

## 2021-11-04 NOTE — Consult Note (Signed)
CARDIOLOGY CONSULT NOTE  ? ? ? ? ? ?Patient ID: ?Adam James ?MRN: [id number] ?DOB/AGE: Dec 19, 1970 51 y.o. ? ?Admit date: 11/04/2021 ?Referring Physician: [place] ?Primary Physician: Pcp, No ?Primary Cardiologist: [place] ?Reason for Consultation: CHF ? ?Active Problems: ?  * No active hospital problems. * ? ? ?HPI:  51 y.o. with CAD/CABG 10/22 with LIMA to LAD, SVG OM, SVG OM3 and PDA too small to graft. Prior to CABG had LLE cellulitis and required left BKA with Dr Sharol Given. EF 25% He has DM, CKD 3 Cr about 1.5 on amiodarone for PVCls He does not weigh himself at home as he can't stand on scale with one leg. Has had 2 weeks of PND/Orthopne, dyspnea and increased RLE edema Compliant with meds. CXR with interstitial pulmonary edema and bilateral pleural effusions. Sats ok BNP 1131 BUN 13 Cr 1.59 Normally takes demadex 20 mg daily No chest pain palpitations or syncope  ? ?ROS ?All other systems reviewed and negative except as noted above ? ?Past Medical History:  ?Diagnosis Date  ? Anemia   ? GI bleed; 03/13/11 EGD: relux esophagitis with ulceration/clot  ? CAD (coronary artery disease)   ? Cardiomyopathy ([place])   ? CHF (congestive heart failure) ([place])   ? CKD (chronic kidney disease)   ? Diabetic foot ulcer ([place])   ? Dyslipidemia   ? Dyspnea   ? Heart failure ([place])   ? Hypertension   ? Myocardial infarction [facility])   ? Type 2 diabetes mellitus ([place])   ?  ?Family History  ?Problem Relation Age of Onset  ? Hypertension Mother   ? Heart failure Father   ? Hypertension Brother   ? Congestive Heart Failure Brother   ?  ?Social History  ? ?Socioeconomic History  ? Marital status: Single  ?  Spouse name: Not on file  ? Number of children: 1  ? Years of education: Not on file  ? Highest education level: 8th grade  ?Occupational History  ? Occupation: none  ?  Comment: former "[employer]"  ?Tobacco Use  ? Smoking status: Former  ?  Packs/day: 3.00  ?  Years: 36.00  ?  Pack years: 108.00  ?  Types: Cigarettes  ?  Quit date: 08/27/2020  ?   Years since quitting: 1.1  ? Smokeless tobacco: Never  ? Tobacco comments:  ?  5-6 daily  ?Vaping Use  ? Vaping Use: Never used  ?Substance and Sexual Activity  ? Alcohol use: Not Currently  ?  Comment: Very rare  ? Drug use: Not Currently  ?  Types: Marijuana  ? Sexual activity: Not on file  ?Other Topics Concern  ? Not on file  ?Social History Narrative  ? Not on file  ? ?Social Determinants of Health  ? ?Financial Resource Strain: High Risk  ? Difficulty of Paying Living Expenses: Hard  ?Food Insecurity: No Food Insecurity  ? Worried About Charity fundraiser in the Last Year: Never true  ? Ran Out of Food in the Last Year: Never true  ?Transportation Needs: Unmet Transportation Needs  ? Lack of Transportation (Medical): Yes  ? Lack of Transportation (Non-Medical): Yes  ?Physical Activity: Not on file  ?Stress: Not on file  ?Social Connections: Not on file  ?Intimate Partner Violence: Not on file  ?  ?Past Surgical History:  ?Procedure Laterality Date  ? AMPUTATION Left 04/30/2021  ? Procedure: LEFT FOOT 5TH RAY AMPUTATION APPLICATION OF A-CELL POWDER;  Surgeon: Newt Minion, MD;  Location: [facility];  Service: Orthopedics;  Laterality: Left;  ? AMPUTATION Left 07/30/2021  ? Procedure: LEFT BELOW KNEE AMPUTATION;  Surgeon: Newt Minion, MD;  Location: [place];  Service: Orthopedics;  Laterality: Left;  ? APPENDECTOMY    ? APPLICATION OF WOUND VAC Left 03/03/2021  ? Procedure: APPLICATION OF WOUND VAC;  Surgeon: Leandrew Koyanagi, MD;  Location: [place];  Service: Orthopedics;  Laterality: Left;  ? APPLICATION OF WOUND VAC  03/07/2021  ? Procedure: APPLICATION OF WOUND VAC;  Surgeon: Newt Minion, MD;  Location: [place];  Service: Orthopedics;;  ? APPLICATION OF WOUND VAC  07/30/2021  ? Procedure: APPLICATION OF WOUND VAC;  Surgeon: Newt Minion, MD;  Location: [place];  Service: Orthopedics;;  ? CARDIAC CATHETERIZATION    ? COLON SURGERY    ? CORONARY ARTERY BYPASS GRAFT N/A 05/22/2021  ? Procedure: CORONARY ARTERY BYPASS  GRAFTING (CABG) X 3 USING LEFT INTERNAL MAMMARY ARTERY AND RIGHT ENDOSCOPIC GREATER SAPHENOUS VEIN CONDUITS;  Surgeon: Lajuana Matte, MD;  Location: [place];  Service: Open Heart Surgery;  Laterality: N/A;  ? ENDOVEIN HARVEST OF GREATER SAPHENOUS VEIN Right 05/22/2021  ? Procedure: ENDOVEIN HARVEST OF GREATER SAPHENOUS VEIN;  Surgeon: Lajuana Matte, MD;  Location: [place];  Service: Open Heart Surgery;  Laterality: Right;  ? ESOPHAGOGASTRODUODENOSCOPY (EGD) WITH PROPOFOL N/A 03/12/2021  ? Procedure: ESOPHAGOGASTRODUODENOSCOPY (EGD) WITH PROPOFOL;  Surgeon: Daryel November, MD;  Location: [place];  Service: Gastroenterology;  Laterality: N/A;  ? I & D EXTREMITY Left 02/24/2021  ? Procedure: IRRIGATION AND DEBRIDEMENT EXTREMITY;  Surgeon: Altamese Sunflower, MD;  Location: [place];  Service: Orthopedics;  Laterality: Left;  ? I & D EXTREMITY Left 02/26/2021  ? Procedure: IRRIGATION AND DEBRIDEMENT OF LEG;  Surgeon: Newt Minion, MD;  Location: [place];  Service: Orthopedics;  Laterality: Left;  ? I & D EXTREMITY Left 03/03/2021  ? Procedure: LEFT LEG IRRIGATION AND DEBRIDEMENT;  Surgeon: Leandrew Koyanagi, MD;  Location: [place];  Service: Orthopedics;  Laterality: Left;  ? I & D EXTREMITY Left 03/07/2021  ? Procedure: REPEAT DEBRIDEMENT LEFT LEG;  Surgeon: Newt Minion, MD;  Location: [place];  Service: Orthopedics;  Laterality: Left;  ? IR FLUORO GUIDE CV LINE RIGHT  03/10/2021  ? IR REMOVAL TUN CV CATH W/O FL  03/24/2021  ? IR US GUIDE VASC ACCESS RIGHT  03/10/2021  ? LEFT HEART CATH AND CORONARY ANGIOGRAPHY N/A 09/13/2020  ? Procedure: LEFT HEART CATH AND CORONARY ANGIOGRAPHY;  Surgeon: Troy Sine, MD;  Location: [place] [facility];  Service: Cardiovascular;  Laterality: N/A;  ? LEFT HEART CATH AND CORONARY ANGIOGRAPHY N/A 03/28/2021  ? Procedure: LEFT HEART CATH AND CORONARY ANGIOGRAPHY;  Surgeon: Larey Dresser, MD;  Location: [place] [facility];  Service: Cardiovascular;  Laterality: N/A;  ? RIGHT HEART CATH  N/A 03/26/2021  ? Procedure: RIGHT HEART CATH;  Surgeon: Larey Dresser, MD;  Location: [place] [facility];  Service: Cardiovascular;  Laterality: N/A;  ? TEE WITHOUT CARDIOVERSION N/A 05/22/2021  ? Procedure: TRANSESOPHAGEAL ECHOCARDIOGRAM (TEE);  Surgeon: Lajuana Matte, MD;  Location: [place];  Service: Open Heart Surgery;  Laterality: N/A;  ? TOE AMPUTATION  2020  ?  ? ?No current facility-administered medications for this encounter. ? ?Current Outpatient Medications:  ?  amiodarone (PACERONE) 200 MG tablet, Take 1 tablet (200 mg total) by mouth daily., Disp: 30 tablet, Rfl: 3 ?  aspirin EC 81 MG tablet, Take  81 mg by mouth daily. Swallow whole., Disp: , Rfl:  ?  atorvastatin (LIPITOR) 80 MG tablet, Take 1 tablet (80 mg total) by mouth daily., Disp: 30 tablet, Rfl: 3 ?  digoxin (LANOXIN) 0.125 MG tablet, Take 1 tablet (0.125 mg total) by mouth daily., Disp: 30 tablet, Rfl: 1 ?  glucose blood (TRUE METRIX BLOOD GLUCOSE TEST) test strip, Use as instructed twice daily, Disp: 100 each, Rfl: 12 ?  glyBURIDE (DIABETA) 5 MG tablet, Take 1 tablet (5 mg total) by mouth 2 (two) times daily with a meal., Disp: 60 tablet, Rfl: 1 ?  metFORMIN (GLUCOPHAGE) 500 MG tablet, Take 1 tablet (500 mg total) by mouth 2 (two) times daily., Disp: 60 tablet, Rfl: 1 ?  metoprolol succinate (TOPROL-XL) 25 MG 24 hr tablet, Take 1/2 tablet (12.5 mg total) by mouth at bedtime., Disp: 15 tablet, Rfl: 1 ?  oxyCODONE-acetaminophen (PERCOCET/ROXICET) 5-325 MG tablet, Take 1 tablet by mouth every 4 (four) hours as needed., Disp: 30 tablet, Rfl: 0 ?  oxyCODONE-acetaminophen (PERCOCET/ROXICET) 5-325 MG tablet, Take 1 tablet by mouth every 4 (four) hours as needed., Disp: 30 tablet, Rfl: 0 ?  torsemide (DEMADEX) 20 MG tablet, Take 1 tablet (20 mg total) by mouth daily., Disp: 30 tablet, Rfl: 0 ?  TRUEplus Lancets 28G MISC, Use as directed twice daily at 8 am and 10 pm., Disp: 100 each, Rfl: 11 ? ? ? ?Physical Exam: ?Blood pressure (!) 148/101,  pulse 100, temperature 99 ?F (37.2 ?C), temperature source Oral, resp. rate 20, SpO2 95 %.   ? ?Chronically ill male ?Basilar rales ?JVP elevated  ?Abdomen benign HJR positive ?Left BKA ?Plus 2-3 right LE ed

## 2021-11-04 NOTE — Progress Notes (Signed)
Lower extremity venous attempted. Patient refused. Would like to go home. PA made aware. ? ?Darlin Coco, RDMS, RVT ?
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
## 2021-11-04 NOTE — Assessment & Plan Note (Signed)
AKI on CKD stage IIIb.   ?Creatinine is mildly elevated 1.56 from prior of 1.44 just a month ago.  Unclear baseline.  Suspect likely due to cardiorenal syndrome ?-Monitor closely while receiving IV diuretics ?

## 2021-11-04 NOTE — Assessment & Plan Note (Signed)
Presented with hyperglycemia of 266 ?Check HbA1C ?Start moderate SSI ?

## 2021-11-04 NOTE — Assessment & Plan Note (Signed)
Presented with symptoms of dyspnea, orthopnea/PND, lower extremity edema with chest x-ray finding of interstitial edema and bilateral pleural effusion. ?- Last echocardiogram performed on 02/2021 showed EF of 25% with global hypokinesis and grade 2 diastolic dysfunction.  Mild mitral and aortic valve regurgitation.  Will repeat echo. ?-Cardiology has evaluated and recommending IV 60mg  Lasix daily. Patient to be evaluated by CHF team in the morning.   ?-Monitor intake and output, daily weights ?-Resume home medications ?

## 2021-11-05 ENCOUNTER — Inpatient Hospital Stay (HOSPITAL_COMMUNITY): Payer: Self-pay

## 2021-11-05 DIAGNOSIS — I5021 Acute systolic (congestive) heart failure: Secondary | ICD-10-CM

## 2021-11-05 LAB — BASIC METABOLIC PANEL
Anion gap: 9 (ref 5–15)
BUN: 15 mg/dL (ref 6–20)
CO2: 24 mmol/L (ref 22–32)
Calcium: 8.2 mg/dL — ABNORMAL LOW (ref 8.9–10.3)
Chloride: 103 mmol/L (ref 98–111)
Creatinine, Ser: 1.58 mg/dL — ABNORMAL HIGH (ref 0.61–1.24)
GFR, Estimated: 53 mL/min — ABNORMAL LOW (ref >60)
Glucose, Bld: 345 mg/dL — ABNORMAL HIGH (ref 70–99)
Potassium: 3.8 mmol/L (ref 3.5–5.1)
Sodium: 136 mmol/L (ref 135–145)

## 2021-11-05 LAB — GLUCOSE, CAPILLARY
Glucose-Capillary: 172 mg/dL — ABNORMAL HIGH (ref 70–99)
Glucose-Capillary: 209 mg/dL — ABNORMAL HIGH (ref 70–99)
Glucose-Capillary: 170 mg/dL — ABNORMAL HIGH (ref 70–99)

## 2021-11-05 LAB — ECHOCARDIOGRAM COMPLETE
Area-P 1/2: 6.57 cm2
Calc EF: 21.5 %
MV M vel: 4.82 m/s
MV Peak grad: 92.9 mmHg
Radius: 0.6 cm
Single Plane A2C EF: 11.1 %
Single Plane A4C EF: 30.7 %
Weight: 3912 oz

## 2021-11-05 LAB — CBC
HCT: 32.6 % — ABNORMAL LOW (ref 39.0–52.0)
Hemoglobin: 10.5 g/dL — ABNORMAL LOW (ref 13.0–17.0)
MCH: 28.2 pg (ref 26.0–34.0)
MCHC: 32.2 g/dL (ref 30.0–36.0)
MCV: 87.6 fL (ref 80.0–100.0)
Platelets: 201 10*3/uL (ref 150–400)
RBC: 3.72 MIL/uL — ABNORMAL LOW (ref 4.22–5.81)
RDW: 14.6 % (ref 11.5–15.5)
WBC: 6.5 10*3/uL (ref 4.0–10.5)
nRBC: 0 % (ref 0.0–0.2)

## 2021-11-05 LAB — HEMOGLOBIN A1C
Hgb A1c MFr Bld: 10.1 % — ABNORMAL HIGH (ref 4.8–5.6)
Mean Plasma Glucose: 243.17 mg/dL

## 2021-11-05 LAB — CBG MONITORING, ED: Glucose-Capillary: 294 mg/dL — ABNORMAL HIGH (ref 70–99)

## 2021-11-05 MED ORDER — AMIODARONE HCL 200 MG PO TABS
200.0000 mg | ORAL_TABLET | Freq: Every day | ORAL | Status: DC
  Administered 2021-11-05 – 2021-11-08 (×4): 200 mg via ORAL
  Filled 2021-11-05 (×4): qty 1

## 2021-11-05 MED ORDER — PERFLUTREN LIPID MICROSPHERE
1.0000 mL | INTRAVENOUS | Status: AC | PRN
  Administered 2021-11-05: 2 mL via INTRAVENOUS
  Filled 2021-11-05: qty 10

## 2021-11-05 MED ORDER — DIGOXIN 125 MCG PO TABS
0.1250 mg | ORAL_TABLET | Freq: Every day | ORAL | Status: DC
  Administered 2021-11-05 – 2021-11-08 (×4): 0.125 mg via ORAL
  Filled 2021-11-05 (×4): qty 1

## 2021-11-05 MED ORDER — SPIRONOLACTONE 12.5 MG HALF TABLET
12.5000 mg | ORAL_TABLET | Freq: Every day | ORAL | Status: DC
  Filled 2021-11-05: qty 1

## 2021-11-05 MED ORDER — GLIPIZIDE 5 MG PO TABS
2.5000 mg | ORAL_TABLET | Freq: Two times a day (BID) | ORAL | Status: DC
  Administered 2021-11-05 – 2021-11-07 (×4): 2.5 mg via ORAL
  Filled 2021-11-05 (×3): qty 0.5
  Filled 2021-11-05: qty 1
  Filled 2021-11-05: qty 0.5

## 2021-11-05 MED ORDER — EMPAGLIFLOZIN 10 MG PO TABS
10.0000 mg | ORAL_TABLET | Freq: Every day | ORAL | Status: DC
  Administered 2021-11-05 – 2021-11-08 (×4): 10 mg via ORAL
  Filled 2021-11-05 (×4): qty 1

## 2021-11-05 MED ORDER — ASPIRIN EC 81 MG PO TBEC
81.0000 mg | DELAYED_RELEASE_TABLET | Freq: Every day | ORAL | Status: DC
Start: 2021-11-05 — End: 2021-11-08
  Administered 2021-11-05 – 2021-11-08 (×4): 81 mg via ORAL
  Filled 2021-11-05 (×4): qty 1

## 2021-11-05 MED ORDER — EPLERENONE 25 MG PO TABS
25.0000 mg | ORAL_TABLET | Freq: Every day | ORAL | Status: DC
  Administered 2021-11-05 – 2021-11-08 (×4): 25 mg via ORAL
  Filled 2021-11-05 (×4): qty 1

## 2021-11-05 MED ORDER — NON FORMULARY
25.0000 mg | Freq: Every day | Status: DC
Start: 2021-11-05 — End: 2021-11-05

## 2021-11-05 NOTE — Progress Notes (Addendum)
?PROGRESS NOTE ? ? ?Adam James  [id number] DOB: Jun 18, 1971 DOA: 11/04/2021 ?PCP: Pcp, No  ?Brief Narrative:  ? ?51 year old home dwelling white male CABG 04/2021, HFrEF EF 25% on Demadex, SVT on amiodarone DM TY 2, CKD 3 (baseline creatinine 1.5) BKA left side Dr. Sharol Given 07/2021 ?Missed several doses of Demadex secondary to miscommunication and patient held the same for over 1 month ?Presented to ED with overall worsening of shortness of breath ?BNP 1130 troponin gray zone 27 (prior troponin 35 10/05/2021) ?Found to be in acute heart failure  ?Cardiology consulted for recommendations and management ? ?Hospital-Problem based course ? ?Acute decompensated systolic heart failure-last EF 25% (dry weight 242 pounds?) ?CABG 04/2021 with no targets for revascularization ?Secondary to noncompliance on Demadex ?Cardiology recommends: IV Lasix 60 mg twice daily-advanced heart failure team consult ?SVT on amiodarone ?Resume amiodarone 200 daily--resume digoxin 0.125 ?At this time because of acute heart failure physiology hold Toprol-XL 12.5 ?CKD 3A with underlying diabetes hypertension and peripheral vascular disease ?Prior BKA left side Dr. Sharol Given 07/2021 ?Continue Lasix as above ?Poorly controlled diabetes mellitus type ?CBGs ranging 269 345-patient is on borderline with regards to use of metformin so this has been held ?We will discontinue glyburide--Starting Jardiance 10 mg--have to ensure affordable ?Add long-acting as tolerable ?Left-sided BKA Dr. Sharol Given ?Stump seems clean-reassess as outpatient ? ?DVT prophylaxis: Lovenox ?Code Status: Full ?Family Communication: None present ?Disposition:  ?Status is: Inpatient ?Remains inpatient appropriate because:  ? ?Has heart failure and is not euvolemic yet ?  ?Consultants:  ?Cardiology ? ?Procedures: None ? ?Antimicrobials: None yet ? ? ?Subjective: ?"I need privacy-I cannot go to the bathroom like this" ?Patient upset at lack of privacy in the emergency room in the hallway ?No  chest pain no fever no chills no nausea no vomiting ?Still feels swollen-tells me that his weight at home is about to 20 pounds but cannot recall the last time he checked it-here his weight is 242 pounds-worries about the effect of Lasix on his kidneys-reassured that this needs to be titrated and managed carefully and that we will be monitoring him in the hospital ? ?Objective: ?Vitals:  ? 11/05/21 0455 11/05/21 0835 11/05/21 0922 11/05/21 1059  ?BP: 123/83 128/80  132/86  ?Pulse: 93 90  92  ?Resp: 17 18  17   ?Temp: 97.7 ?F (36.5 ?C)     ?TempSrc: Oral     ?SpO2: 94% 95%  96%  ?Weight:   110.9 kg   ? ? ?Intake/Output Summary (Last 24 hours) at 11/05/2021 1135 ?Last data filed at 11/05/2021 0858 ?Gross per 24 hour  ?Intake --  ?Output 600 ml  ?Net -600 ml  ? ?Filed Weights  ? 11/05/21 0922  ?Weight: 110.9 kg  ? ? ?Examination: ?Awake coherent ?Thick neck Mallampati 3 ?Multiple tattoos ?S1-S2 no murmur ?JVD halfway up neck ?Abdomen obese cannot appreciate HSM ?BKA noted ?Other legs seem slightly red with stasis dermatitis as well as grade 2-3 edema ? ? ?Data Reviewed: personally reviewed  ? ?CBC ?   ?Component Value Date/Time  ? WBC 6.5 11/05/2021 0840  ? RBC 3.72 (L) 11/05/2021 0840  ? HGB 10.5 (L) 11/05/2021 0840  ? HGB 9.9 (L) 05/12/2021 1502  ? HCT 32.6 (L) 11/05/2021 0840  ? HCT 30.9 (L) 05/12/2021 1502  ? PLT 201 11/05/2021 0840  ? PLT 272 05/12/2021 1502  ? MCV 87.6 11/05/2021 0840  ? MCV 89 05/12/2021 1502  ? MCH 28.2 11/05/2021 0840  ? MCHC 32.2 11/05/2021 0840  ?  RDW 14.6 11/05/2021 0840  ? RDW 14.1 05/12/2021 1502  ? LYMPHSABS 2.2 11/04/2021 1435  ? LYMPHSABS 4.2 (H) 05/12/2021 1502  ? MONOABS 0.4 11/04/2021 1435  ? EOSABS 0.2 11/04/2021 1435  ? EOSABS 0.2 05/12/2021 1502  ? BASOSABS 0.1 11/04/2021 1435  ? BASOSABS 0.1 05/12/2021 1502  ? ? ?  Latest Ref Rng & Units 11/05/2021  ?  8:40 AM 11/04/2021  ?  2:35 PM 10/05/2021  ?  3:06 PM  ?CMP  ?Glucose 70 - 99 mg/dL 345   266   241    ?BUN 6 - 20 mg/dL 15   13   14      ?Creatinine 0.61 - 1.24 mg/dL 1.58   1.59   1.44    ?Sodium 135 - 145 mmol/L 136   141   138    ?Potassium 3.5 - 5.1 mmol/L 3.8   3.9   3.7    ?Chloride 98 - 111 mmol/L 103   107   104    ?CO2 22 - 32 mmol/L 24   27   26     ?Calcium 8.9 - 10.3 mg/dL 8.2   8.4   8.3    ?Total Protein 6.5 - 8.1 g/dL  5.4     ?Total Bilirubin 0.3 - 1.2 mg/dL  0.7     ?Alkaline Phos 38 - 126 U/L  81     ?AST 15 - 41 U/L  13     ?ALT 0 - 44 U/L  10     ? ? ? ?Radiology Studies: ?DG Chest 2 View ? ?Result Date: 11/04/2021 ?CLINICAL DATA:  Shortness of breath. EXAM: CHEST - 2 VIEW COMPARISON:  10/05/2021 FINDINGS: The cardiac silhouette, mediastinal and hilar contours are within normal limits and stable. Stable surgical changes from bypass surgery. Increased interstitial markings and mild peribronchial thickening suggesting interstitial pulmonary edema. There are also small bilateral pleural effusions. No pulmonary infiltrates or pneumothorax. The bony thorax is intact. IMPRESSION: Interstitial pulmonary edema and bilateral pleural effusions. Electronically Signed   By: Marijo Sanes M.D.   On: 11/04/2021 15:13   ? ? ?Scheduled Meds: ? atorvastatin  80 mg Oral Daily  ? enoxaparin (LOVENOX) injection  40 mg Subcutaneous Q24H  ? furosemide  60 mg Intravenous BID  ? insulin aspart  0-15 Units Subcutaneous TID WC  ? ?Continuous Infusions: ? ? LOS: 1 day  ? ?Time spent: 23 ? ?Nita Sells, MD ?[facility] ?To contact the attending provider between 7A-7P or the covering provider during after hours 7P-7A, please log into the web site [email or web address] and access using universal [place] password for that web site. If you do not have the password, please call the hospital operator. ? ?11/05/2021, 11:35 AM  ? ? ?

## 2021-11-05 NOTE — Progress Notes (Signed)
PT Cancellation Note ? ?Patient Details ?Name: Adam James ?MRN: [id number] ?DOB: Nov 25, 1970 ? ? ?Cancelled Treatment:    Reason Eval/Treat Not Completed: Other (comment) As soon as PT entered room and introduced self, pt became very hostile and began raising his voice and yelling, adamantly refusing physical therapy evaluation. Pt reports he is modI with mobility. Does endorse shortness of breath with activity, but continues to refuse assessment. Notified HF team. Will sign off at this time. ? ?Lillia Pauls, PT, DPT ?Acute Rehabilitation Services ?Pager [phone] ?Office [phone] ? ? ? ?Carloine Ernestina Penna ?11/05/2021, 2:04 PM ?

## 2021-11-05 NOTE — ED Notes (Signed)
Pt given urinals to use , and privacy screens at this time  ?

## 2021-11-05 NOTE — ED Notes (Signed)
Admit MD at bedside

## 2021-11-05 NOTE — Plan of Care (Signed)
  Problem: Education: Goal: Knowledge of General Education information will improve Description Including pain rating scale, medication(s)/side effects and non-pharmacologic comfort measures Outcome: Progressing   Problem: Health Behavior/Discharge Planning: Goal: Ability to manage health-related needs will improve Outcome: Progressing   

## 2021-11-05 NOTE — Progress Notes (Signed)
Heart Failure Navigator Progress Note ? ?Assessed for Heart & Vascular TOC clinic readiness.  ?Patient does not meet criteria due to being a patient of the [facility]. Post-hosp appt sch with them for 11/19/21 at 2:30 pm  ? ?Navigator available for reassessment of patient.  ? ?Meredith Staggers, RN, BSN, CHFN ?Heart Failure Navigator ?Heart & Vascular Care Navigation Team ? ? ?

## 2021-11-05 NOTE — Progress Notes (Signed)
OT Cancellation Note ? ?Patient Details ?Name: Quadir Muns ?MRN: [id number] ?DOB: 05-06-1971 ? ? ?Cancelled Treatment:    Reason Eval/Treat Not Completed: Other (comment) ?Patient was in bed when approached for session. Patient was notably irritated with idea of needing therapy at this time. Patient was angry stating that " I can get around just fine by myself, how do you think I was doing it at home". Patient declined to participate in therapy at this time or in the future. OT signing off at this time. ? ?Yeraldin Litzenberger OTR/L, MS ?Acute Rehabilitation Department ?Office# [phone] ?Pager# [phone] ? ?11/05/2021, 4:10 PM ?

## 2021-11-05 NOTE — Plan of Care (Signed)
°  Problem: Health Behavior/Discharge Planning: °Goal: Ability to manage health-related needs will improve °Outcome: Progressing °  °Problem: Clinical Measurements: °Goal: Respiratory complications will improve °Outcome: Progressing °  °Problem: Clinical Measurements: °Goal: Cardiovascular complication will be avoided °Outcome: Progressing °  °

## 2021-11-05 NOTE — ED Notes (Signed)
Pt updated on POC, pt given PO food/fluids. GCS 15 ?

## 2021-11-05 NOTE — Consult Note (Addendum)
?  ?Advanced Heart Failure Team Consult Note ? ? ?Primary Physician: Pcp, No ?PCP-Cardiologist:  Shirlee More, MD ? ?Reason for Consultation: acute on chronic systolic CHF ? ?HPI:   ? ?Adam James is seen today for evaluation of acute on chronic systolic CHF at the request of Dr. Johnsie Cancel with Cardiology.  ? ?51 y.o.with a history CAD, Had PCI 2007 in [place]. 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 ?  ?Admitted to [facility] in February 2022 with lower extremity edema and foot ulcer. BNP and HS Trop elevated and felt to be non-ACS.  Cardiology consulted and he was placed on lasix.  ?  ?He had follow up with Dr Bettina Gavia 09/09/20 and was set up for cath. Cath with multivessel CAD. Saw Dr Kipp Brood in the office with plans for CMRI. He was referred to SW due to financial concerns and no payor source. CMRI showed EF 40% and viable myocardium.   ?  ?Saw Dr Kipp Brood in July 2022 and was set up for CABG in August. Pre CABG labs completed and showed WBC 25. He was advised to go to the ED.  ?  ?Admitted 02/25/2021 with left foot infection complicated by osteomyelitis and necrotizing fasciitis. 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 Hospital course complicated by erosive esophagitis on EGD. Recommendations for protonix 40 mg bid + repeat EGD in 8 weeks. Received multiple transfusions. Discharged 03/15/21.  ?  ?Admitted 03/23/21 with A/C CHF exacerbation. Diuresed with IV lasix. Cardiology consulted. CO-OX 55%. Echo 03/23/21 with LVEF of 25%. [place] 03/26/21 showed preserved cardiac output, mildly elevated PCWP, and mild pulmonary venous hypertension. LHC performed on 03/28/21 showed minimal change from prior study in 2/22.  ?  ?After full treatment of foot infection, he was admitted in 10/22 for CABG with LIMA-LAD, SVG-OM1, SVG-OM3.  PDA too small to graft.  ? ?Seen  for f/u 12/22. Volume looked good, actually hypovolemic. Arlyce Harman and torsemide stopped. He was to be seen for f/u in 3 weeks but did not show up for the appointment.  ? ?He underwent BKA on left 01/23 d/t osteomyelitis and abscess. ? ?Patient seen in ED on 10/05/21 with worsening dyspnea and leg edema. Felt to be volume overloaded. COVID 19 +. Given 40 mg lasix IV and instructed to take 20 mg Torsemide daily. He was felt to be stable for discharge home. ? ?Cancelled his f/u and echo on 10/29/21. ? ?Presented to ED 04/11 with worsening dyspnea, lower extremity edema, orthopnea and PND.  Labs significant for BNP 1,131 (123 in 12/22, 933 03/23), Hgb 10.9, HS troponin 27>27, Scr 1.59.  CXR consistent with pulmonary edema and bilateral pleural effusions.  He was admitted to hospitalist service for management of a/c systolic CHF. He was given 40 mg Lasix IV. Cardiology consulted and started on IV lasix 60 mg BID.  ? ?Echo pending ? ?600 cc UOP has been charted since arrival. He reports 2L UOP so far. Scr 1. 58, K 3.8 and CO2 24.  ? ?He lives in [place], [place]. Currently uninsured. Reports disability and medicaid were previously denied. Missed his last 2 appointments d/t transportation issues. Has been unable to afford refills and ran out of several of his heart failure medicines as well as his diabetes medications. Feels helpless and depressed by his situation. ? ?Review of Systems: [y] = yes, [ ]  = no  ? ?General: Weight gain [Y]; Weight loss [ ] ;  Anorexia [ ] ; Fatigue [ ] ; Fever [ ] ; Chills [ ] ; Weakness [ ]   ?Cardiac: Chest pain/pressure [ ] ; Resting SOB [Y]; Exertional SOB [Y]; Orthopnea [Y]; Pedal Edema [Y]; Palpitations [ ] ; Syncope [ ] ; Presyncope [ ] ; Paroxysmal nocturnal dyspnea[Y]  ?Pulmonary: Cough [ ] ; Wheezing[ ] ; Hemoptysis[ ] ; Sputum [ ] ; Snoring [ ]   ?GI: Vomiting[ ] ; Dysphagia[ ] ; Melena[ ] ; Hematochezia [ ] ; Heartburn[ ] ; Abdominal pain [ ] ; Constipation [ ] ; Diarrhea [ ] ; BRBPR [ ]   ?GU: Hematuria[ ] ;  Dysuria [ ] ; Nocturia[ ]   ?Vascular: Pain in legs with walking [ ] ; Pain in feet with lying flat [ ] ; Non-healing sores [ ] ; Stroke [ ] ; TIA [ ] ; Slurred speech [ ] ;  ?Neuro: Headaches[ ] ; Vertigo[ ] ; Seizures[ ] ; Paresthesias[ ] ;Blurred vision [ ] ; Diplopia [ ] ; Vision changes [ ]   ?Ortho/Skin: Arthritis [ ] ; Joint pain [ ] ; Muscle pain [ ] ; Joint swelling [ ] ; Back Pain [ ] ; Rash [ ]   ?Psych: Depression[ ] ; Anxiety[ ]   ?Heme: Bleeding problems [Y]; Clotting disorders [ ] ; Anemia [Y]  ?Endocrine: Diabetes [Y]; Thyroid dysfunction[ ]  ? ?Home Medications ?Prior to Admission medications   ?Medication Sig Start Date End Date Taking? Authorizing Provider  ?albuterol (VENTOLIN HFA) 108 (90 Base) MCG/ACT inhaler Inhale 2 puffs into the lungs every 4 (four) hours as needed for shortness of breath or wheezing. 10/09/21   [provider]  ?amiodarone (PACERONE) 200 MG tablet Take 1 tablet (200 mg total) by mouth daily. 07/11/21   Rafael Bihari, FNP  ?aspirin EC 81 MG tablet Take 81 mg by mouth daily. Swallow whole.    [provider]  ?atorvastatin (LIPITOR) 80 MG tablet Take 1 tablet (80 mg total) by mouth daily. 07/11/21   Rafael Bihari, FNP  ?digoxin (LANOXIN) 0.125 MG tablet Take 1 tablet (0.125 mg total) by mouth daily. 07/09/21   Rafael Bihari, FNP  ?furosemide (LASIX) 40 MG tablet Take 40-80 mg by mouth See admin instructions. 40mg  alternating 80mg  every other day 10/09/21   [provider]  ?glucose blood (TRUE METRIX BLOOD GLUCOSE TEST) test strip Use as instructed twice daily 05/12/21   Mayers, Cari S, PA-C  ?glyBURIDE (DIABETA) 5 MG tablet Take 1 tablet (5 mg total) by mouth 2 (two) times daily with a meal. 08/05/21   Newt Minion, MD  ?metFORMIN (GLUCOPHAGE) 500 MG tablet Take 1 tablet (500 mg total) by mouth 2 (two) times daily. 05/12/21   Mayers, Cari S, PA-C  ?metoprolol succinate (TOPROL-XL) 25 MG 24 hr tablet Take 1/2 tablet (12.5 mg total) by mouth at bedtime.  07/09/21   Rafael Bihari, FNP  ?oxyCODONE-acetaminophen (PERCOCET/ROXICET) 5-325 MG tablet Take 1 tablet by mouth every 4 (four) hours as needed. ?Patient not taking: Reported on 11/05/2021 08/05/21   Newt Minion, MD  ?torsemide (DEMADEX) 20 MG tablet Take 1 tablet (20 mg total) by mouth daily. 10/05/21   Dorie Rank, MD  ?TRUEplus Lancets 28G MISC Use as directed twice daily at 8 am and 10 pm. 05/12/21   Mayers, Loraine Grip, PA-C  ? ? ?Past Medical History: ?Past Medical History:  ?Diagnosis Date  ? Anemia   ? GI bleed; 03/13/11 EGD: relux esophagitis with ulceration/clot  ? CAD (coronary artery disease)   ? Cardiomyopathy ([place])   ? CHF (congestive heart failure) ([place])   ? CKD (chronic kidney disease)   ? Diabetic foot ulcer ([place])   ? Dyslipidemia   ? Dyspnea   ?  Heart failure ([place])   ? Hypertension   ? Myocardial infarction [facility])   ? Type 2 diabetes mellitus ([place])   ? ? ?Past Surgical History: ?Past Surgical History:  ?Procedure Laterality Date  ? AMPUTATION Left 04/30/2021  ? Procedure: LEFT FOOT 5TH RAY AMPUTATION APPLICATION OF A-CELL POWDER;  Surgeon: Newt Minion, MD;  Location: [place];  Service: Orthopedics;  Laterality: Left;  ? AMPUTATION Left 07/30/2021  ? Procedure: LEFT BELOW KNEE AMPUTATION;  Surgeon: Newt Minion, MD;  Location: [place];  Service: Orthopedics;  Laterality: Left;  ? APPENDECTOMY    ? APPLICATION OF WOUND VAC Left 03/03/2021  ? Procedure: APPLICATION OF WOUND VAC;  Surgeon: Leandrew Koyanagi, MD;  Location: [place];  Service: Orthopedics;  Laterality: Left;  ? APPLICATION OF WOUND VAC  03/07/2021  ? Procedure: APPLICATION OF WOUND VAC;  Surgeon: Newt Minion, MD;  Location: [place];  Service: Orthopedics;;  ? APPLICATION OF WOUND VAC  07/30/2021  ? Procedure: APPLICATION OF WOUND VAC;  Surgeon: Newt Minion, MD;  Location: [place];  Service: Orthopedics;;  ? CARDIAC CATHETERIZATION    ? COLON SURGERY    ? CORONARY ARTERY BYPASS GRAFT N/A 05/22/2021  ? Procedure: CORONARY ARTERY BYPASS GRAFTING (CABG)  X 3 USING LEFT INTERNAL MAMMARY ARTERY AND RIGHT ENDOSCOPIC GREATER SAPHENOUS VEIN CONDUITS;  Surgeon: Lajuana Matte, MD;  Location: [place];  Service: Open Heart Surgery;  Laterality: N/A;  ? ENDOVEIN HARVEST

## 2021-11-06 LAB — GLUCOSE, CAPILLARY
Glucose-Capillary: 132 mg/dL — ABNORMAL HIGH (ref 70–99)
Glucose-Capillary: 140 mg/dL — ABNORMAL HIGH (ref 70–99)
Glucose-Capillary: 164 mg/dL — ABNORMAL HIGH (ref 70–99)
Glucose-Capillary: 248 mg/dL — ABNORMAL HIGH (ref 70–99)

## 2021-11-06 LAB — LIPID PANEL
Cholesterol: 171 mg/dL (ref 0–200)
HDL: 39 mg/dL — ABNORMAL LOW (ref >40)
LDL Cholesterol: 112 mg/dL — ABNORMAL HIGH (ref 0–99)
Total CHOL/HDL Ratio: 4.4 RATIO
Triglycerides: 100 mg/dL (ref <150)
VLDL: 20 mg/dL (ref 0–40)

## 2021-11-06 LAB — BASIC METABOLIC PANEL
Anion gap: 6 (ref 5–15)
BUN: 16 mg/dL (ref 6–20)
CO2: 32 mmol/L (ref 22–32)
Calcium: 8.5 mg/dL — ABNORMAL LOW (ref 8.9–10.3)
Chloride: 101 mmol/L (ref 98–111)
Creatinine, Ser: 1.72 mg/dL — ABNORMAL HIGH (ref 0.61–1.24)
GFR, Estimated: 48 mL/min — ABNORMAL LOW (ref >60)
Glucose, Bld: 149 mg/dL — ABNORMAL HIGH (ref 70–99)
Potassium: 4 mmol/L (ref 3.5–5.1)
Sodium: 139 mmol/L (ref 135–145)

## 2021-11-06 LAB — CBC WITH DIFFERENTIAL/PLATELET
Abs Immature Granulocytes: 0.02 10*3/uL (ref 0.00–0.07)
Basophils Absolute: 0.1 10*3/uL (ref 0.0–0.1)
Basophils Relative: 1 %
Eosinophils Absolute: 0.3 10*3/uL (ref 0.0–0.5)
Eosinophils Relative: 3 %
HCT: 30.5 % — ABNORMAL LOW (ref 39.0–52.0)
Hemoglobin: 9.9 g/dL — ABNORMAL LOW (ref 13.0–17.0)
Immature Granulocytes: 0 %
Lymphocytes Relative: 35 %
Lymphs Abs: 3 10*3/uL (ref 0.7–4.0)
MCH: 28 pg (ref 26.0–34.0)
MCHC: 32.5 g/dL (ref 30.0–36.0)
MCV: 86.4 fL (ref 80.0–100.0)
Monocytes Absolute: 0.5 10*3/uL (ref 0.1–1.0)
Monocytes Relative: 6 %
Neutro Abs: 4.7 10*3/uL (ref 1.7–7.7)
Neutrophils Relative %: 55 %
Platelets: 246 10*3/uL (ref 150–400)
RBC: 3.53 MIL/uL — ABNORMAL LOW (ref 4.22–5.81)
RDW: 14.6 % (ref 11.5–15.5)
WBC: 8.6 10*3/uL (ref 4.0–10.5)
nRBC: 0 % (ref 0.0–0.2)

## 2021-11-06 LAB — MAGNESIUM: Magnesium: 1.6 mg/dL — ABNORMAL LOW (ref 1.7–2.4)

## 2021-11-06 LAB — RENAL FUNCTION PANEL
Albumin: 2.1 g/dL — ABNORMAL LOW (ref 3.5–5.0)
Anion gap: 7 (ref 5–15)
BUN: 19 mg/dL (ref 6–20)
CO2: 27 mmol/L (ref 22–32)
Calcium: 8.2 mg/dL — ABNORMAL LOW (ref 8.9–10.3)
Chloride: 104 mmol/L (ref 98–111)
Creatinine, Ser: 1.63 mg/dL — ABNORMAL HIGH (ref 0.61–1.24)
GFR, Estimated: 51 mL/min — ABNORMAL LOW (ref >60)
Glucose, Bld: 114 mg/dL — ABNORMAL HIGH (ref 70–99)
Phosphorus: 4.1 mg/dL (ref 2.5–4.6)
Potassium: 3.5 mmol/L (ref 3.5–5.1)
Sodium: 138 mmol/L (ref 135–145)

## 2021-11-06 LAB — TSH: TSH: 3.942 u[IU]/mL (ref 0.350–4.500)

## 2021-11-06 LAB — MRSA NEXT GEN BY PCR, NASAL: MRSA by PCR Next Gen: NOT DETECTED

## 2021-11-06 LAB — DIGOXIN LEVEL: Digoxin Level: 0.7 ng/mL — ABNORMAL LOW (ref 0.8–2.0)

## 2021-11-06 MED ORDER — MAGNESIUM SULFATE 4 GM/100ML IV SOLN
4.0000 g | Freq: Once | INTRAVENOUS | Status: AC
  Administered 2021-11-06: 4 g via INTRAVENOUS
  Filled 2021-11-06: qty 100

## 2021-11-06 MED ORDER — POTASSIUM CHLORIDE CRYS ER 20 MEQ PO TBCR
40.0000 meq | EXTENDED_RELEASE_TABLET | ORAL | Status: AC
  Administered 2021-11-06 (×2): 40 meq via ORAL
  Filled 2021-11-06 (×2): qty 2

## 2021-11-06 MED ORDER — LOSARTAN POTASSIUM 25 MG PO TABS
25.0000 mg | ORAL_TABLET | Freq: Every day | ORAL | Status: DC
  Administered 2021-11-06 – 2021-11-08 (×3): 25 mg via ORAL
  Filled 2021-11-06 (×3): qty 1

## 2021-11-06 MED ORDER — LIVING WELL WITH DIABETES BOOK
Freq: Once | Status: DC
  Filled 2021-11-06: qty 1

## 2021-11-06 MED ORDER — MELATONIN 3 MG PO TABS
3.0000 mg | ORAL_TABLET | Freq: Every evening | ORAL | Status: DC | PRN
  Filled 2021-11-06: qty 1

## 2021-11-06 NOTE — Progress Notes (Signed)
?PROGRESS NOTE ? ? ?Adam James  QMG:[id number] DOB: 1970/12/01 DOA: 11/04/2021 ?PCP: Pcp, No  ?Brief Narrative:  ? ?51 year old home dwelling white male CABG 04/2021, HFrEF EF 25% on Demadex, SVT on amiodarone DM TY 2, CKD 3 (baseline creatinine 1.5) BKA left side Dr. Lajoyce James 07/2021 ?Missed several doses of Demadex secondary to miscommunication and patient held the same for over 1 month ?Presented to ED with overall worsening of shortness of breath ?BNP 1130 troponin gray zone 27 (prior troponin 35 10/05/2021) ?Found to be in acute heart failure  ?Cardiology consulted for recommendations and management ? ?Hospital-Problem based course ? ?Acute decompensated systolic heart failure-last EF 25% (dry weight 242 pounds?) ?CABG 04/2021 with no targets for revascularization ?Secondary to noncompliance on Demadex ?Cardiology recommends: IV Lasix 60 mg bid ?TOC will be consulted to ensure affordability of meds is not an issue ?-4.4 L today weight down from 110 to 106 kg ?SVT on amiodarone/digoxin ?Resume amiodarone 200 daily--resume digoxin 0.125 ?Would check digoxin level 4/14 ?Resume beta-blocker as per heart failure team-hold Toprol-XL 12.5 ?CKD 3A with underlying diabetes hypertension and peripheral vascular disease ?Prior BKA left side Dr. Lajoyce James 07/2021 ?Continue Lasix as above ?Poorly controlled diabetes mellitus type A1c 10.1 not on insulin prior to admission ?Jardiance 10 mg-is $4 according to pharmacist ?Not a good candidate for metformin given rising creatinine-continue sliding scale at this time ?Started this admit glipizide 2.5  bd instead of glyburide ?CBGs seem better controlled today ?Will need discussion about initiation of insulin-diabetic coordinator to see- ?Left-sided BKA Dr. Lajoyce James ?Stump seems clean-reassess as outpatient ?Non-compliance secondary to affordability of meds ? ? ?DVT prophylaxis: Lovenox ?Code Status: Full ?Family Communication: None present ?Disposition:  ?Status is: Inpatient ?Remains  inpatient appropriate because:  ? ?Has heart failure and is not euvolemic yet ?  ?Consultants:  ?Cardiology ? ?Procedures: None ? ?Antimicrobials: None yet ? ? ?Subjective: ?Oral for use his breathing is improved still has lower extremity swelling- ?No chest pain no fever-no cough ? ?Objective: ?Vitals:  ? 11/06/21 0010 11/06/21 0640 11/06/21 0730 11/06/21 0848  ?BP: 131/85 130/88 (!) 142/93   ?Pulse: 92 93 97 94  ?Resp: 20 20 19    ?Temp: 98.2 ?F (36.8 ?C) 98 ?F (36.7 ?C) 97.8 ?F (36.6 ?C)   ?TempSrc: Oral Oral Oral   ?SpO2: 95% 93% 93%   ?Weight:  106 kg    ? ? ?Intake/Output Summary (Last 24 hours) at 11/06/2021 0952 ?Last data filed at 11/06/2021 0732 ?Gross per 24 hour  ?Intake 1200 ml  ?Output 5075 ml  ?Net -3875 ml  ? ? ?Filed Weights  ? 11/05/21 3267 11/06/21 0640  ?Weight: 110.9 kg 106 kg  ? ? ?Examination: ?Awake coherent ?Thick neck Mallampati 3 ?Multiple tattoos ?S1-S2 cannot really appreciate any murmur he seems to be in sinus rhythm with PVCs ?Right lower extremity is less swollen than prior-I did not examine his left stump ?Neuro is intact ?He is euthymic ? ?Data Reviewed: personally reviewed  ? ?CBC ?   ?Component Value Date/Time  ? WBC 8.6 11/06/2021 0120  ? RBC 3.53 (L) 11/06/2021 0120  ? HGB 9.9 (L) 11/06/2021 0120  ? HGB 9.9 (L) 05/12/2021 1502  ? HCT 30.5 (L) 11/06/2021 0120  ? HCT 30.9 (L) 05/12/2021 1502  ? PLT 246 11/06/2021 0120  ? PLT 272 05/12/2021 1502  ? MCV 86.4 11/06/2021 0120  ? MCV 89 05/12/2021 1502  ? MCH 28.0 11/06/2021 0120  ? MCHC 32.5 11/06/2021 0120  ? RDW 14.6 11/06/2021  0120  ? RDW 14.1 05/12/2021 1502  ? LYMPHSABS 3.0 11/06/2021 0120  ? LYMPHSABS 4.2 (H) 05/12/2021 1502  ? MONOABS 0.5 11/06/2021 0120  ? EOSABS 0.3 11/06/2021 0120  ? EOSABS 0.2 05/12/2021 1502  ? BASOSABS 0.1 11/06/2021 0120  ? BASOSABS 0.1 05/12/2021 1502  ? ? ?  Latest Ref Rng & Units 11/06/2021  ?  1:20 AM 11/05/2021  ?  8:40 AM 11/04/2021  ?  2:35 PM  ?CMP  ?Glucose 70 - 99 mg/dL 114   345   266    ?BUN 6 - 20  mg/dL 19   15   13     ?Creatinine 0.61 - 1.24 mg/dL 1.63   1.58   1.59    ?Sodium 135 - 145 mmol/L 138   136   141    ?Potassium 3.5 - 5.1 mmol/L 3.5   3.8   3.9    ?Chloride 98 - 111 mmol/L 104   103   107    ?CO2 22 - 32 mmol/L 27   24   27     ?Calcium 8.9 - 10.3 mg/dL 8.2   8.2   8.4    ?Total Protein 6.5 - 8.1 g/dL   5.4    ?Total Bilirubin 0.3 - 1.2 mg/dL   0.7    ?Alkaline Phos 38 - 126 U/L   81    ?AST 15 - 41 U/L   13    ?ALT 0 - 44 U/L   10    ? ? ? ?Radiology Studies: ?DG Chest 2 View ? ?Result Date: 11/04/2021 ?CLINICAL DATA:  Shortness of breath. EXAM: CHEST - 2 VIEW COMPARISON:  10/05/2021 FINDINGS: The cardiac silhouette, mediastinal and hilar contours are within normal limits and stable. Stable surgical changes from bypass surgery. Increased interstitial markings and mild peribronchial thickening suggesting interstitial pulmonary edema. There are also small bilateral pleural effusions. No pulmonary infiltrates or pneumothorax. The bony thorax is intact. IMPRESSION: Interstitial pulmonary edema and bilateral pleural effusions. Electronically Signed   By: Marijo Sanes M.D.   On: 11/04/2021 15:13  ? ?ECHOCARDIOGRAM COMPLETE ? ?Result Date: 11/05/2021 ?   ECHOCARDIOGRAM REPORT   Patient Name:   Adam James Date of Exam: 11/05/2021 Medical Rec #:  [id number]        Height:       75.0 in Accession #:    [id number]       Weight:       220.0 lb Date of Birth:  1971-06-19        BSA:          2.285 m? Patient Age:    46 years         BP:           133/92 mmHg Patient Gender: M                HR:           99 bpm. Exam Location:  Inpatient Procedure: 2D Echo, Cardiac Doppler, Color Doppler and Intracardiac            Opacification Agent Indications:    CHF-Acute Systolic [id number]  History:        Patient has prior history of Echocardiogram examinations, most                 recent 05/22/2021. CHF, Previous Myocardial Infarction, Prior                 CABG; Risk Factors:Hypertension, Diabetes  and Dyslipidemia.                  Chronic kidney disease. Ischemic cardiomyopathy,.  Sonographer:    Darlina Sicilian RDCS Referring Phys: [id number] [place]  1. Left ventricular ejection fraction, by estimation, is 20 to 25%. The left ventricle has severely decreased function. The left ventricle demonstrates global hypokinesis. Left ventricular diastolic function could not be evaluated.  2. Right ventricular systolic function is moderately reduced. The right ventricular size is normal. There is moderately elevated pulmonary artery systolic pressure. The estimated right ventricular systolic pressure is [id number] mmHg.  3. Left atrial size was mildly dilated.  4. The mitral valve is normal in structure. Mild to moderate mitral valve regurgitation. No evidence of mitral stenosis.  5. The aortic valve is normal in structure. Aortic valve regurgitation is trivial. No aortic stenosis is present.  6. The inferior vena cava is normal in size with greater than 50% respiratory variability, suggesting right atrial pressure of 3 mmHg. FINDINGS  Left Ventricle: Left ventricular ejection fraction, by estimation, is 20 to 25%. The left ventricle has severely decreased function. The left ventricle demonstrates global hypokinesis. Definity contrast agent was given IV to delineate the left ventricular endocardial borders. The left ventricular internal cavity size was normal in size. There is no left ventricular hypertrophy. Abnormal (paradoxical) septal motion, consistent with left bundle branch block. Left ventricular diastolic function could not be evaluated. Normal left ventricular filling pressure. Right Ventricle: The right ventricular size is normal. No increase in right ventricular wall thickness. Right ventricular systolic function is moderately reduced. There is moderately elevated pulmonary artery systolic pressure. The tricuspid regurgitant velocity is 3.31 m/s, and with an assumed right atrial pressure of 3 mmHg, the estimated right  ventricular systolic pressure is [id number] mmHg. Left Atrium: Left atrial size was mildly dilated. Right Atrium: Right atrial size was normal in size. Pericardium: There is no evidence of pericardial effusion. Mi

## 2021-11-06 NOTE — Progress Notes (Signed)
Patient wishes to speak with physician regarding feelings of depression and hopelessness related to physical condition and socioeconomic hardship. ?

## 2021-11-06 NOTE — Progress Notes (Addendum)
? ? Advanced Heart Failure Rounding Note ? ?PCP-Cardiologist: Shirlee More, MD  ? ?Subjective:   ? ? ?Good diuresis yesterday, 5.5L UOP with IV lasix. ? ?Weight down 11 lb.  ? ?Scr stable, 1.58>1.63. Mag 1.6, K 3.5. ? ?BP stable. ? ?Feeling better today, notes brisk output. Pleased with improvement in LE edema and dyspnea. ? ? ?Echo 11/06/21: ?LVEF 20-25%, RV moderately reduced, RVSP 47 mmHg, mild to moderate MR ? ?Objective:   ?Weight Range: ?106 kg ?Body mass index is 29.2 kg/m?.  ? ?Vital Signs:   ?Temp:  [97.3 ?F (36.3 ?C)-98.7 ?F (37.1 ?C)] 97.8 ?F (36.6 ?C) (04/13 0730) ?Pulse Rate:  [92-98] 94 (04/13 0848) ?Resp:  [16-20] 19 (04/13 0730) ?BP: (130-142)/(80-93) 142/93 (04/13 0730) ?SpO2:  [92 %-96 %] 93 % (04/13 0730) ?Weight:  [106 kg] 106 kg (04/13 0640) ?Last BM Date : 11/05/21 ? ?Weight change: ?Filed Weights  ? 11/05/21 0922 11/06/21 0640  ?Weight: 110.9 kg 106 kg  ? ? ?Intake/Output:  ? ?Intake/Output Summary (Last 24 hours) at 11/06/2021 0940 ?Last data filed at 11/06/2021 0732 ?Gross per 24 hour  ?Intake 1200 ml  ?Output 5075 ml  ?Net -3875 ml  ?  ? ? ?Physical Exam  ?  ?General:  No distress. Sitting up in bed. ?HEENT: Normal ?Neck: Supple. JVP to jaw. Carotids 2+ bilat; no bruits.  ?Cor: PMI nondisplaced. Regular rate & rhythm. No rubs, gallops or murmurs. ?Lungs: Clear ?Abdomen: Soft, nontender, nondistended.  ?Extremities: No cyanosis, clubbing, rash, 3+ edema into thighs ?Neuro: Alert & orientedx3, cranial nerves grossly intact. moves all 4 extremities w/o difficulty. Affect pleasant ? ? ?Telemetry  ? ?SR 90s (Personally reviewed) ? ?Labs  ?  ?CBC ?Recent Labs  ?  11/04/21 ?1435 11/05/21 ?0840 11/06/21 ?0120  ?WBC 7.5 6.5 8.6  ?NEUTROABS 4.7  --  4.7  ?HGB 10.9* 10.5* 9.9*  ?HCT 34.1* 32.6* 30.5*  ?MCV 87.4 87.6 86.4  ?PLT 260 201 246  ? ?Basic Metabolic Panel ?Recent Labs  ?  11/05/21 ?0840 11/06/21 ?0120  ?NA 136 138  ?K 3.8 3.5  ?CL 103 104  ?CO2 24 27  ?GLUCOSE 345* 114*  ?BUN 15 19   ?CREATININE 1.58* 1.63*  ?CALCIUM 8.2* 8.2*  ?MG  --  1.6*  ?PHOS  --  4.1  ? ?Liver Function Tests ?Recent Labs  ?  11/04/21 ?1435 11/06/21 ?0120  ?AST 13*  --   ?ALT 10  --   ?ALKPHOS 81  --   ?BILITOT 0.7  --   ?PROT 5.4*  --   ?ALBUMIN 2.4* 2.1*  ? ?No results for input(s): LIPASE, AMYLASE in the last 72 hours. ?Cardiac Enzymes ?No results for input(s): CKTOTAL, CKMB, CKMBINDEX, TROPONINI in the last 72 hours. ? ?BNP: ?BNP (last 3 results) ?Recent Labs  ?  07/11/21 ?1253 10/05/21 ?1506 11/04/21 ?1435  ?BNP 123.8* 933.3* 1,131.2*  ? ? ?ProBNP (last 3 results) ?No results for input(s): PROBNP in the last 8760 hours. ? ? ?D-Dimer ?No results for input(s): DDIMER in the last 72 hours. ?Hemoglobin A1C ?Recent Labs  ?  11/05/21 ?0840  ?HGBA1C 10.1*  ? ?Fasting Lipid Panel ?Recent Labs  ?  11/06/21 ?0120  ?CHOL 171  ?HDL 39*  ?LDLCALC 112*  ?TRIG 100  ?CHOLHDL 4.4  ? ?Thyroid Function Tests ?Recent Labs  ?  11/06/21 ?0120  ?TSH 3.942  ? ? ?Other results: ? ? ?Imaging  ? ? ?ECHOCARDIOGRAM COMPLETE ? ?Result Date: 11/05/2021 ?   ECHOCARDIOGRAM REPORT  Patient Name:   Adam James Date of Exam: 11/05/2021 Medical Rec #:  [id number]        Height:       75.0 in Accession #:    [id number]       Weight:       220.0 lb Date of Birth:  1971/07/16        BSA:          2.285 m? Patient Age:    51 years         BP:           133/92 mmHg Patient Gender: M                HR:           99 bpm. Exam Location:  Inpatient Procedure: 2D Echo, Cardiac Doppler, Color Doppler and Intracardiac            Opacification Agent Indications:    CHF-Acute Systolic [id number]  History:        Patient has prior history of Echocardiogram examinations, most                 recent 05/22/2021. CHF, Previous Myocardial Infarction, Prior                 CABG; Risk Factors:Hypertension, Diabetes and Dyslipidemia.                 Chronic kidney disease. Ischemic cardiomyopathy,.  Sonographer:    Darlina Sicilian RDCS Referring Phys: [id number] [place]  1. Left ventricular ejection fraction, by estimation, is 20 to 25%. The left ventricle has severely decreased function. The left ventricle demonstrates global hypokinesis. Left ventricular diastolic function could not be evaluated.  2. Right ventricular systolic function is moderately reduced. The right ventricular size is normal. There is moderately elevated pulmonary artery systolic pressure. The estimated right ventricular systolic pressure is [id number] mmHg.  3. Left atrial size was mildly dilated.  4. The mitral valve is normal in structure. Mild to moderate mitral valve regurgitation. No evidence of mitral stenosis.  5. The aortic valve is normal in structure. Aortic valve regurgitation is trivial. No aortic stenosis is present.  6. The inferior vena cava is normal in size with greater than 50% respiratory variability, suggesting right atrial pressure of 3 mmHg. FINDINGS  Left Ventricle: Left ventricular ejection fraction, by estimation, is 20 to 25%. The left ventricle has severely decreased function. The left ventricle demonstrates global hypokinesis. Definity contrast agent was given IV to delineate the left ventricular endocardial borders. The left ventricular internal cavity size was normal in size. There is no left ventricular hypertrophy. Abnormal (paradoxical) septal motion, consistent with left bundle branch block. Left ventricular diastolic function could not be evaluated. Normal left ventricular filling pressure. Right Ventricle: The right ventricular size is normal. No increase in right ventricular wall thickness. Right ventricular systolic function is moderately reduced. There is moderately elevated pulmonary artery systolic pressure. The tricuspid regurgitant velocity is 3.31 m/s, and with an assumed right atrial pressure of 3 mmHg, the estimated right ventricular systolic pressure is [id number] mmHg. Left Atrium: Left atrial size was mildly dilated. Right Atrium: Right atrial size was normal  in size. Pericardium: There is no evidence of pericardial effusion. Mitral Valve: The mitral valve is normal in structure. Mild to moderate mitral valve regurgitation. No evidence of mitral valve stenosis. Tricuspid Valve: The tricuspid valve is normal in structure. Tricuspid valve regurgitation is mild . No  evidence of tricuspid stenosis. Aortic Valve: The aortic valve is normal in structure. Aortic valve regurgitation is trivial. No aortic stenosis is present. Pulmonic Valve: The pulmonic valve was normal in structure. Pulmonic valve regurgitation is not visualized. No evidence of pulmonic stenosis. Aorta: The aortic root is normal in size and structure. Venous: The inferior vena cava is normal in size with greater than 50% respiratory variability, suggesting right atrial pressure of 3 mmHg. IAS/Shunts: No atrial level shunt detected by color flow Doppler.   LV Volumes (MOD) LV vol d, MOD A2C: 270.0 ml Diastology LV vol d, MOD A4C: 287.0 ml LV e' medial:    4.60 cm/s LV vol s, MOD A2C: 240.0 ml LV E/e' medial:  17.8 LV vol s, MOD A4C: 199.0 ml LV e' lateral:   9.44 cm/s LV SV MOD A2C:     30.0 ml  LV E/e' lateral: 8.7 LV SV MOD A4C:     287.0 ml LV SV MOD BP:      59.7 ml RIGHT VENTRICLE RV S prime:     7.05 cm/s TAPSE (M-mode): 1.2 cm LEFT ATRIUM             Index        RIGHT ATRIUM           Index LA Vol (A2C):   77.8 ml 34.07 ml/m?  RA Area:     18.10 cm? LA Vol (A4C):   80.9 ml 35.40 ml/m?  RA Volume:   51.50 ml  22.54 ml/m? LA Biplane Vol: 86.0 ml 37.64 ml/m?  AORTIC VALVE LVOT Vmax:   77.00 cm/s LVOT Vmean:  54.500 cm/s LVOT VTI:    0.123 m MITRAL VALVE                  TRICUSPID VALVE MV Area (PHT): 6.57 cm?       TR Peak grad:   43.8 mmHg MV Decel Time: 116 msec       TR Vmax:        331.00 cm/s MR Peak grad:    92.9 mmHg MR Mean grad:    62.0 mmHg    SHUNTS MR Vmax:         482.00 cm/s  Systemic VTI: 0.12 m MR Vmean:        376.0 cm/s MR PISA:         2.26 cm? MR PISA Eff ROA: 16 mm? MR PISA Radius:   0.60 cm MV E velocity: 81.85 cm/s Fransico Him MD Electronically signed by Fransico Him MD Signature Date/Time: 11/05/2021/2:42:57 PM    Final    ? ? ?Medications:   ? ? ?Scheduled Medications: ? amiodarone  200 mg Oral Neoma Laming

## 2021-11-06 NOTE — TOC Initial Note (Signed)
Transition of Care (TOC) - Initial/Assessment Note  ? ? ?Patient Details  ?Name: Adam James ?MRN: [id number] ?Date of Birth: [birth year] ? ?Transition of Care (TOC) CM/SW Contact:    ?Ryan Palermo, LCSW ?Phone Number: ?11/06/2021, 2:11 PM ? ?Clinical Narrative:                 ?HF CSW spoke with Mr. Lenn at bedside and he presented frustrated and reported that he just sent the Medicaid person with [facility] out of the room. Mr. Wherley reported that he is depressed and that he was denied for Medicaid and disability. Mr. Radel reported that he was denied for disability because his amputation is below the knee however if it had been above the knee he would have qualified for disability. Mr. Ziemann reported that he is working with a Regulatory affairs officer and is appealing the denial as of February this year. Mr. Patlan reported to be in debt from all of the medical bills and is extremely frustrated with his situation. Mr. Samaan reported that he has most needed DME except that he needs a shower chair. ? ?CSW will continue to follow throughout discharge. ? ?  ?Barriers to Discharge: Continued Medical Work up ? ? ?Patient Goals and CMS Choice ?  ?  ?  ? ?Expected Discharge Plan and Services ?  ?In-house Referral: Clinical Social Work ?  ?  ?  ?                ?  ?  ?  ?  ?  ?  ?  ?  ?  ?  ? ?Prior Living Arrangements/Services ?  ?Lives with:: Self ?Patient language and need for interpreter reviewed:: Yes ?       ?Need for Family Participation in Patient Care: No (Comment) ?Care giver support system in place?: No (comment) ?  ?Criminal Activity/Legal Involvement Pertinent to Current Situation/Hospitalization: No - Comment as needed ? ?Activities of Daily Living ?  ?  ? ?Permission Sought/Granted ?  ?  ?   ?   ?   ?   ? ?Emotional Assessment ?Appearance:: Appears stated age ?Attitude/Demeanor/Rapport: Engaged ?Affect (typically observed): Depressed ?Orientation: : Oriented to Self, Oriented to Place, Oriented to   Time, Oriented to Situation ?  ?Psych Involvement: No (comment) ? ?Admission diagnosis:  Acute on chronic systolic heart failure (HCC) [I50.23] ?Acute on chronic systolic CHF (congestive heart failure) (HCC) [I50.23] ?Patient Active Problem List  ? Diagnosis Date Noted  ? Acute on chronic systolic CHF (congestive heart failure) (HCC) 11/04/2021  ? Acute kidney injury superimposed on chronic kidney disease (HCC) 11/04/2021  ? Below-knee amputation of left lower extremity (HCC) 07/30/2021  ? 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 Acute osteomyelitis of left foot (HCC)   ? Acute exacerbation of CHF (congestive heart failure) (HCC) 04/28/2021  ? Venous stasis ulcer of right calf limited to breakdown of skin without varicose veins (HCC)   ? Non-pressure chronic ulcer of other part of left foot limited to breakdown of skin (HCC)   ? Subacute osteomyelitis, left ankle and foot (HCC)   ? Acute on chronic combined systolic and diastolic CHF (congestive heart failure) (HCC)   ? Acute on chronic heart failure (HCC) 03/23/2021  ? NSTEMI (non-ST elevated myocardial infarction) (HCC)   ? Gastroesophageal reflux disease with esophagitis and hemorrhage   ? Anemia, posthemorrhagic, acute   ? Acute esophagitis   ? Gastrointestinal hemorrhage   ? Necrotizing fasciitis (HCC)   ? Left leg  cellulitis   ? Diabetic polyneuropathy associated with type 2 diabetes mellitus (HCC)   ? Severe protein-calorie malnutrition (HCC)   ? Gangrene of left foot (HCC) 02/25/2021  ? Type 2 diabetes mellitus (HCC)   ? Heart failure (HCC)   ? Dyslipidemia   ? Diabetic foot ulcer (HCC)   ? CAD (coronary artery disease)   ? Cardiomyopathy (HCC)   ? Coronary artery disease involving native coronary artery of native heart with angina pectoris (HCC)   ? ?PCP:  Pcp, No ?Pharmacy:   ?[facility], [place] - [address] ?[address] ?[place] [address] ?Phone: [phone] Fax: [phone] ? ? ? ? ?Social Determinants of  Health (SDOH) Interventions ?Financial Strain Interventions: Other (Comment) (HF fund and [place] [facility] referral) ?Housing Interventions: Intervention Not Indicated ? ?Readmission Risk Interventions ?   ? View : No data to display.  ?  ?  ?  ? ?Kelsi Benham, MSW, LCSW ?[phone] ?Heart Failure Social Worker  ? ?

## 2021-11-06 NOTE — Progress Notes (Signed)
?Heart and Vascular Care Navigation ? ?11/06/2021 ? ?Adam James ?[birth year] ?[id number] ? ?Reason for Referral: Outpatient HF paramedicine enrollment ? ?  ?Engaged with patient face to face for initial visit for Heart and Vascular Care Coordination. ?                                                                                                  ?Assessment:   Met with pt to discuss above.  Pt hesitant at first due to not wanting people in the house but eventually became agreeable to give the program a try. ? ?Paramedicine Initial Assessment: ? ?Housing:  ?In what kind of housing do you live? House/apt/trailer/shelter? trailer ? ?Do you live with anyone? dtr ? ?Are you currently worried about losing your housing? no ? ?Social:  ?Do you have any children? dtr ? ?Do you have family or friends who live locally? Dtr who he lives with and mother who lives locally ? ? ?Income:  ?What is your current source of income? Pending disability case ? ?Insurance:  ?Are you currently insured? no ? ?Do you have prescription coverage? No- gets [facility] ? ?If no insurance, have you applied for coverage (Medicaid, disability, marketplace etc)? Planning to apply for Medicaid but was denied- screened per Adam James note and not found to have any changes since last denial- pt also not cooperative with obtaining necessary documents during last attempt. ? ?Transportation:  ?Do you have transportation to your medical appointments?dtr drives him ? ? ? Daily Health Needs: ?Do you have a working scale at home? yes ? ?How do you manage your medications at home? Organizes and takes him himself- states no problems with this ? ?Do you have issues affording your medications? Yes- gets meds through [place] [facility] but not all of them can be filled this way ? ?If yes, has this ever prevented you from obtaining medications? Yes has been without diabetes medications for past 2 months- had them filled last hospital stay then had no way to obtain  them after ? ?Do you have any concerns with mobility at home? Reports he completes ADLs independently ? ?Do you use any assistive devices at home or have PCS at home? Wheelchair and walker ? ?Do you have a PCP? No- discussed referral to [place] office but pt not sure if he wants to have to come to [facility] for another thing. ? ?                                  ? ?HRT/VAS Care Coordination   ? ? Patients Home Cardiology Office [facility]  ? Outpatient Care Team Social Worker  ? Social Worker Name: Adam Hummer, LCSW, [facility]  ? Living arrangements for the past 2 months Single Family Home  ? Lives with: Adult Children  ? Patient Current Insurance Coverage Self-Pay  ? Patient Has Concern With Paying Medical Bills Yes  ? Patient Concerns With Medical Bills ongoing medical care, past unpaid expenses  ? Medical Bill Referrals: [facility]  ? Does Patient  Have Prescription Coverage? No  ? Patient Prescription Assistance Programs [facility]  ? [facility] Medications pt application submitted, if approved will send medications  ? Other Assistance Programs Medications assistance financially provided at [facility] on 6/28  ? Home Assistive Devices/Equipment Eyeglasses; Dentures (specify type); CBG Meter; Other (Comment)  boot  ? DME Agency [employer]  ? [address]  ? Current home services Other (comment)  none  ? ?  ? ? ?Social History:                                                                             ?SDOH Screenings  ? ?Alcohol Screen: Low Risk   ? Last Alcohol Screening Score (AUDIT): 0  ?Depression (PHQ2-9): Low Risk   ? PHQ-2 Score: 1  ?Financial Resource Strain: High Risk  ? Difficulty of Paying Living Expenses: Hard  ?Food Insecurity: No Food Insecurity  ? Worried About Charity fundraiser in the Last Year: Never true  ? Ran Out of Food in the Last Year: Never true  ?Housing: Low Risk   ? Last Housing Risk Score: 0  ?Physical Activity: Not on file  ?Social Connections: Not on  file  ?Stress: Not on file  ?Tobacco Use: Medium Risk  ? Smoking Tobacco Use: Former  ? Smokeless Tobacco Use: Never  ? Passive Exposure: Not on file  ?Transportation Needs: No Transportation Needs  ? Lack of Transportation (Medical): No  ? Lack of Transportation (Non-Medical): No  ? ? ?SDOH Interventions: ?Financial Resources:  Financial Strain Interventions: Other (Comment) (HF fund and [place] [facility] referral) ?Working on Environmental manager- states that it is in Psychologist, forensic and he is working with the help of a Chief Executive Officer  ?Food Insecurity:   None reported  ?Housing Insecurity:  Housing Interventions: Intervention Not Indicated  ?Transportation:    Dtr drives him to appts  ? ? ?Follow-up plan:   ? ?CSW sent out referral to [facility] Paramedics for assignment ? ?Adam Ny, LCSW ?Clinical Social Worker ?[facility] ?Desk#: [phone] ?Cell#: [phone] ? ? ? ?

## 2021-11-06 NOTE — Progress Notes (Signed)
Pt was measured for knee high TED hose for R leg. Size XX large long not in stock and has been ordered, Licensed conveyancer states SPD will have it tomorrow. Pt informed and on coming RN informed.  ?

## 2021-11-07 ENCOUNTER — Other Ambulatory Visit (HOSPITAL_COMMUNITY): Payer: Self-pay

## 2021-11-07 LAB — GLUCOSE, CAPILLARY
Glucose-Capillary: 160 mg/dL — ABNORMAL HIGH (ref 70–99)
Glucose-Capillary: 172 mg/dL — ABNORMAL HIGH (ref 70–99)
Glucose-Capillary: 142 mg/dL — ABNORMAL HIGH (ref 70–99)
Glucose-Capillary: 210 mg/dL — ABNORMAL HIGH (ref 70–99)

## 2021-11-07 LAB — COMPREHENSIVE METABOLIC PANEL
ALT: 9 U/L (ref 0–44)
AST: 12 U/L — ABNORMAL LOW (ref 15–41)
Albumin: 2.2 g/dL — ABNORMAL LOW (ref 3.5–5.0)
Alkaline Phosphatase: 79 U/L (ref 38–126)
Anion gap: 7 (ref 5–15)
BUN: 16 mg/dL (ref 6–20)
CO2: 29 mmol/L (ref 22–32)
Calcium: 8.4 mg/dL — ABNORMAL LOW (ref 8.9–10.3)
Chloride: 97 mmol/L — ABNORMAL LOW (ref 98–111)
Creatinine, Ser: 1.87 mg/dL — ABNORMAL HIGH (ref 0.61–1.24)
GFR, Estimated: 44 mL/min — ABNORMAL LOW (ref >60)
Glucose, Bld: 167 mg/dL — ABNORMAL HIGH (ref 70–99)
Potassium: 4.2 mmol/L (ref 3.5–5.1)
Sodium: 133 mmol/L — ABNORMAL LOW (ref 135–145)
Total Bilirubin: 0.5 mg/dL (ref 0.3–1.2)
Total Protein: 5.4 g/dL — ABNORMAL LOW (ref 6.5–8.1)

## 2021-11-07 LAB — MAGNESIUM: Magnesium: 2.2 mg/dL (ref 1.7–2.4)

## 2021-11-07 MED ORDER — VENLAFAXINE HCL ER 75 MG PO CP24
75.0000 mg | ORAL_CAPSULE | Freq: Every day | ORAL | Status: DC
  Administered 2021-11-08: 75 mg via ORAL
  Filled 2021-11-07: qty 1

## 2021-11-07 MED ORDER — ATORVASTATIN CALCIUM 80 MG PO TABS
80.0000 mg | ORAL_TABLET | Freq: Every day | ORAL | 3 refills | Status: DC
  Filled 2021-11-07: qty 30, 30d supply, fill #0

## 2021-11-07 MED ORDER — LOSARTAN POTASSIUM 25 MG PO TABS
25.0000 mg | ORAL_TABLET | Freq: Every day | ORAL | 11 refills | Status: DC
  Filled 2021-11-07: qty 30, 30d supply, fill #0

## 2021-11-07 MED ORDER — FUROSEMIDE 10 MG/ML IJ SOLN
60.0000 mg | Freq: Once | INTRAMUSCULAR | Status: AC
  Administered 2021-11-07: 60 mg via INTRAVENOUS
  Filled 2021-11-07: qty 6

## 2021-11-07 MED ORDER — VENLAFAXINE HCL ER 75 MG PO CP24
75.0000 mg | ORAL_CAPSULE | Freq: Every day | ORAL | 2 refills | Status: DC
  Filled 2021-11-07: qty 30, 30d supply, fill #0

## 2021-11-07 MED ORDER — GLIPIZIDE 5 MG PO TABS
5.0000 mg | ORAL_TABLET | Freq: Two times a day (BID) | ORAL | 2 refills | Status: DC
  Filled 2021-11-07: qty 60, 30d supply, fill #0

## 2021-11-07 MED ORDER — GLIPIZIDE 5 MG PO TABS
5.0000 mg | ORAL_TABLET | Freq: Two times a day (BID) | ORAL | Status: DC
Start: 2021-11-07 — End: 2021-11-08
  Administered 2021-11-07 – 2021-11-08 (×2): 5 mg via ORAL
  Filled 2021-11-07 (×3): qty 1

## 2021-11-07 MED ORDER — FUROSEMIDE 40 MG PO TABS
60.0000 mg | ORAL_TABLET | Freq: Every day | ORAL | Status: DC
  Administered 2021-11-08: 60 mg via ORAL
  Filled 2021-11-07: qty 1

## 2021-11-07 MED ORDER — FUROSEMIDE 40 MG PO TABS
60.0000 mg | ORAL_TABLET | Freq: Every day | ORAL | 11 refills | Status: DC
  Filled 2021-11-07: qty 45, 30d supply, fill #0

## 2021-11-07 MED ORDER — ASPIRIN 81 MG PO TBEC
81.0000 mg | DELAYED_RELEASE_TABLET | Freq: Every day | ORAL | 11 refills | Status: DC
  Filled 2021-11-07: qty 30, 30d supply, fill #0
  Filled 2022-02-09: qty 30, 30d supply, fill #1

## 2021-11-07 MED ORDER — EMPAGLIFLOZIN 10 MG PO TABS
10.0000 mg | ORAL_TABLET | Freq: Every day | ORAL | 11 refills | Status: DC
Start: 2021-11-08 — End: 2021-12-24
  Filled 2021-11-07: qty 30, 30d supply, fill #0

## 2021-11-07 MED ORDER — DIGOXIN 125 MCG PO TABS
0.1250 mg | ORAL_TABLET | Freq: Every day | ORAL | 1 refills | Status: DC
Start: 2021-11-07 — End: 2021-12-24
  Filled 2021-11-07: qty 30, 30d supply, fill #0

## 2021-11-07 MED ORDER — EPLERENONE 25 MG PO TABS
25.0000 mg | ORAL_TABLET | Freq: Every day | ORAL | 11 refills | Status: DC
  Filled 2021-11-07: qty 30, 30d supply, fill #0

## 2021-11-07 MED ORDER — AMIODARONE HCL 200 MG PO TABS
200.0000 mg | ORAL_TABLET | Freq: Every day | ORAL | 3 refills | Status: DC
  Filled 2021-11-07: qty 30, 30d supply, fill #0

## 2021-11-07 NOTE — Progress Notes (Addendum)
? ? Advanced Heart Failure Rounding Note ? ?PCP-Cardiologist: Norman Herrlich, MD  ? ?Subjective:   ?Admit weight 244--->221 today  ? ?Continues diurese well with IV lasix.  ? ? ?Creatinine starting to trend up. 1.6>1.7>1.9  ? ?Denies SOB. Says he has click when he is breathing. Doesn't hurt but he wanted to mention it.  ? ? ?Echo 11/06/21: ?LVEF 20-25%, RV moderately reduced, RVSP 47 mmHg, mild to moderate MR ? ?Objective:   ?Weight Range: ?100.2 kg ?Body mass index is 27.62 kg/m?.  ? ?Vital Signs:   ?Temp:  [97.5 ?F (36.4 ?C)-98.1 ?F (36.7 ?C)] 97.5 ?F (36.4 ?C) (04/14 0409) ?Pulse Rate:  [88-94] 89 (04/14 0409) ?Resp:  [19-20] 19 (04/14 0409) ?BP: (112-125)/(79-84) 121/84 (04/14 0409) ?SpO2:  [91 %-94 %] 91 % (04/14 0409) ?Weight:  [100.2 kg] 100.2 kg (04/14 0409) ?Last BM Date : 11/06/21 ? ?Weight change: ?Filed Weights  ? 11/05/21 0922 11/06/21 0640 11/07/21 0409  ?Weight: 110.9 kg 106 kg 100.2 kg  ? ? ?Intake/Output:  ? ?Intake/Output Summary (Last 24 hours) at 11/07/2021 0829 ?Last data filed at 11/07/2021 0417 ?Gross per 24 hour  ?Intake 2424 ml  ?Output 8625 ml  ?Net -6201 ml  ?  ? ? ?Physical Exam  ?General:  In bed. No resp difficulty ?HEENT: normal ?Neck: supple. JVP 6-7 . Carotids 2+ bilat; no bruits. No lymphadenopathy or thryomegaly appreciated. ?Cor: PMI nondisplaced. Regular rate & rhythm. No rubs, gallops or murmurs. ?Lungs: clear ?Abdomen: soft, nontender, nondistended. No hepatosplenomegaly. No bruits or masses. Good bowel sounds. ?Extremities: no cyanosis, clubbing, rash, RLE 1+ edema LBKA  ?Neuro: alert & orientedx3, cranial nerves grossly intact. moves all 4 extremities w/o difficulty. Affect pleasant ? ?Telemetry  ? ?SR 80-90s personally checked.  ? ?Labs  ?  ?CBC ?Recent Labs  ?  11/04/21 ?1435 11/05/21 ?0840 11/06/21 ?0120  ?WBC 7.5 6.5 8.6  ?NEUTROABS 4.7  --  4.7  ?HGB 10.9* 10.5* 9.9*  ?HCT 34.1* 32.6* 30.5*  ?MCV 87.4 87.6 86.4  ?PLT 260 201 246  ? ?Basic Metabolic Panel ?Recent Labs  ?   11/06/21 ?0120 11/06/21 ?1102 11/07/21 ?0335  ?NA 138 139 133*  ?K 3.5 4.0 4.2  ?CL 104 101 97*  ?CO2 27 32 29  ?GLUCOSE 114* 149* 167*  ?BUN 19 16 16   ?CREATININE 1.63* 1.72* 1.87*  ?CALCIUM 8.2* 8.5* 8.4*  ?MG 1.6*  --  2.2  ?PHOS 4.1  --   --   ? ?Liver Function Tests ?Recent Labs  ?  11/04/21 ?1435 11/06/21 ?0120 11/07/21 ?0335  ?AST 13*  --  12*  ?ALT 10  --  9  ?ALKPHOS 81  --  79  ?BILITOT 0.7  --  0.5  ?PROT 5.4*  --  5.4*  ?ALBUMIN 2.4* 2.1* 2.2*  ? ?No results for input(s): LIPASE, AMYLASE in the last 72 hours. ?Cardiac Enzymes ?No results for input(s): CKTOTAL, CKMB, CKMBINDEX, TROPONINI in the last 72 hours. ? ?BNP: ?BNP (last 3 results) ?Recent Labs  ?  07/11/21 ?1253 10/05/21 ?1506 11/04/21 ?1435  ?BNP 123.8* 933.3* 1,131.2*  ? ? ?ProBNP (last 3 results) ?No results for input(s): PROBNP in the last 8760 hours. ? ? ?D-Dimer ?No results for input(s): DDIMER in the last 72 hours. ?Hemoglobin A1C ?Recent Labs  ?  11/05/21 ?0840  ?HGBA1C 10.1*  ? ?Fasting Lipid Panel ?Recent Labs  ?  11/06/21 ?0120  ?CHOL 171  ?HDL 39*  ?LDLCALC 112*  ?TRIG 100  ?CHOLHDL 4.4  ? ?  Thyroid Function Tests ?Recent Labs  ?  11/06/21 ?0120  ?TSH 3.942  ? ? ?Other results: ? ? ?Imaging  ? ? ?No results found. ? ? ?Medications:   ? ? ?Scheduled Medications: ? amiodarone  200 mg Oral Daily  ? aspirin EC  81 mg Oral Daily  ? atorvastatin  80 mg Oral Daily  ? digoxin  0.125 mg Oral Daily  ? empagliflozin  10 mg Oral Daily  ? enoxaparin (LOVENOX) injection  40 mg Subcutaneous Q24H  ? eplerenone  25 mg Oral Daily  ? furosemide  60 mg Intravenous BID  ? glipiZIDE  2.5 mg Oral BID AC  ? insulin aspart  0-15 Units Subcutaneous TID WC  ? living well with diabetes book   Does not apply Once  ? losartan  25 mg Oral Daily  ? ? ?Infusions: ? ? ?PRN Medications: ? ? ? ? ?Patient Profile  ? ?51 y.o. male with history of chronic systolic CHF, CAD s/p PCI 2007 and CABG 10/22, uncontrolled DM, left foot infection c/b osteomyelitis and multiple  debridements with ultimate BKA in 01/23, nonadherence with medical therapy. Now admitted with a/c systolic CHF. ? ?Assessment/Plan  ? ?1. Acute on chronic systolic CHF: Ischemic cardiomyopathy.  Initial echo in 2/22 with EF 40-45%.  Echo in 7/22 with EF 30-35%.  Echo 8/22 EF down to 25% with mild RV dysfunction and dilated IVC.  RHC 8/22 with preserved CO, mildly elevated PCWP, mild pulmonary hypertension.  ?- S/p CABG 10/22 ?- Had not been taking diuretics since 12/22. Missed f/u visit in 01/23.  Seen in ED 03/23 with a/c CHF and restarted diuretic. Cancelled f/u 04/05.  ?- Echo this admit: EF 20-25%, RV moderately reduced, mild to moderate MR ?- Suspect some of the reason EF not improving is d/t noncompliance. ?- Give last dose of IV lasix this morning.  Tomorrow start lasix 60 mg daily.  ?- No beta blocker for now with a/c systolic CHF  ?- Continue Jardiance 10 mg daily ?- Does not want to take spiro d/t concerns it may lower testosterone levels, started eplerenone 25 mg daily ?- Continue digoxin 0.125 mg daily ?- Continue losartan 25 mg daily. Entresto not on HF fund. ?2. CAD: Hx PCI 2007. S/p CABG x 3 with LIMA-LAD, SVG-OM2, and SVG-OM3 10/22. No chest pain.  ?- Restart ASA 81 daily.  ?- Continue Atorvastatin 80 mg daily ?- HS troponin mildly elevated with no trend. Doubt ACS. Continue medical management. ?3. PVCs/VT: ?- Noted 10/22 post CABG. ?- Suppressed this admit ?- Has been on 200 mg Amiodarone daily. Continue  ?4. CKD, 3a:  ?- Creatinine variable over last year but baseline seems to be 1.4-1.7, 1.87 today. ?- Daily BMET.  ?5. GI bleeding: History of erosive esophagitis earlier in 8/22.  He is now on Protonix.   ?- 4/13 Hgb stable at 9.9 ?6. 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 Underwent left BKA in 01/23. ?- Plans to get fitted for prosthesis ?7. DM2: Needs a PCP/endocrinologist.  ?- A1c 10.1.  ?- Ran out of medications at home ?- Management per  primary ?8. SDOH:  ?-Uninsured. Reports disability and medicaid were previously denied. Engage HF TOC CSW to assist. ?-Lives in [place], [place] [facility]). Discussed enrollment in paramedicine program, refused. ?-Will need medications sent to [facility] at discharge through HF fund ?-Reports his daughter is able to provide reliable transportation in the future ? ?Refused PT/OT evaluation. ? ?Will need meds prior to  d/c  ? ?HF F/U placed on chart.  ? ?HF Meds for d/c  ?Lasix 60 mg daily  ?Amiodarone 200 mg daily  ?Aspirin 81 mg daily  ?Atorvastatin 80 mg daily ?Digoxin 0.125 mg daily  ?Jardiance 10 mg daily  ?Losartan 25 mg daily  ?Eplerenone 25 mg daily  ? ?Length of Stay: 3 ? ?Tonye BecketAmy Clegg, NP  ?11/07/2021, 8:29 AM ? ?Advanced Heart Failure Team ?Pager [phone] (M-F; 7a - 5p)  ?Please contact [facility] for night-coverage after hours (5p -7a ) and weekends on [email or web address] ?  ?Patient seen and examined with the above-signed Advanced Practice Provider and/or Housestaff. I personally reviewed laboratory data, imaging studies and relevant notes. I independently examined the patient and formulated the important aspects of the plan. I have edited the note to reflect any of my changes or salient points. I have personally discussed the plan with the patient and/or family. ? ?Denies CP or SOB. Diuresing well with IV lasix. Renal function stable ? ?General:  Sitting up in bed No resp difficulty ?HEENT: normal ?Neck: supple. JVP 8-9 Carotids 2+ bilat; no bruits. No lymphadenopathy or thryomegaly appreciated. ?Cor: PMI nondisplaced. Regular rate & rhythm. No rubs, gallops or murmurs. ?Lungs: clear ?Abdomen: soft, nontender, nondistended. No hepatosplenomegaly. No bruits or masses. Good bowel sounds. ?Extremities: no cyanosis, clubbing, rash, 1+ edema on RLE. S/p L BKA ?Neuro: alert & orientedx3, cranial nerves grossly intact. moves all 4 extremities w/o difficulty. Affect pleasant ? ?Diuresing well. Continue IV lasix for one more  day. Possible d/c in am.  ? ?Arvilla Meresaniel Laurynn Mccorvey, MD  ?6:25 PM ? ? ?

## 2021-11-07 NOTE — Progress Notes (Signed)
Inpatient Diabetes Program Recommendations ? ?AACE/ADA: New Consensus Statement on Inpatient Glycemic Control (2015) ? ?Target Ranges:  Prepandial:   less than 140 mg/dL ?     Peak postprandial:   less than 180 mg/dL (1-2 hours) ?     Critically ill patients:  140 - 180 mg/dL  ? ?Lab Results  ?Component Value Date  ? GLUCAP 160 (H) 11/07/2021  ? HGBA1C 10.1 (H) 11/05/2021  ? ? ?Review of Glycemic Control ? ?Diabetes history: DM2 ?Outpatient Diabetes medications: Glyburide 5 mg BID, Metformin 500 mg BID ?Current orders for Inpatient glycemic control: Glipizide 2.5 mg BID, Jardiance 10 mg QD, Novolog 0-15 units TID ? ?Inpatient Diabetes Program Recommendations:   ? ?For DC would recommend Glipizide 5 mg BID ? ?Spoke with patient at bedside.  He has not taken DM medications for more than 2 months because he is unemployeed and cannot afford them.  He has a functioning glucometer and does not check his blood sugar.  A1C was 7.9% in January of this year and is now 10.1%.  Reviewed patient's current A1c;  explained what a A1c is and what it measures. Also reviewed goal A1c with patient, importance of good glucose control @ home, and blood sugar goals.  Discussed long and short term complications of uncontrolled BG.   ? ?He states he feels worthless because he does not have insurance and cannot afford medications.   ? ?He does not have a PCP.  He has been seen at [facility] here at [facility] in the past.  He states he owes the pharmacy here $50 and cannot afford to pay.  Will reach out to [facility] for assistance with meds and a f/u appointment.   ? ?He does not want to start on insulin at home.  Discussed importance of obtaining medications and taking as prescribed.  He is aware of hypoglycemia, signs, symptoms and treatments.   ? ?He lives in [place] and his daughter is able to drive him to appointments.  Discussed The Plate Method, CHO's and portion control.  He does not drink any beverages with sugar.    ? ?Will continue to follow while inpatient. ? ?Thank you, ?Dulce Sellar, MSN, RN ?Diabetes Coordinator ?Inpatient Diabetes Program ?[phone] (team pager from 8a-5p) ? ? ? ?

## 2021-11-07 NOTE — Progress Notes (Signed)
?PROGRESS NOTE ? ? ?Adam James  [id number] DOB: 07-Nov-1970 DOA: 11/04/2021 ?PCP: Pcp, No  ?Brief Narrative:  ? ?51 year old home dwelling white male CABG 04/2021, HFrEF EF 25% on Demadex, SVT on amiodarone DM TY 2, CKD 3 (baseline creatinine 1.5) BKA left side Dr. Sharol Given 07/2021 ?Missed several doses of Demadex secondary to miscommunication and patient held the same for over 1 month ?Presented to ED with overall worsening of shortness of breath ?BNP 1130 troponin gray zone 27 (prior troponin 35 10/05/2021) ?Found to be in acute heart failure  ?Cardiology consulted for recommendations and management ? ?Hospital-Problem based course ? ?Acute decompensated systolic heart failure-last EF 25% (dry weight 242 pounds?) ?CABG 04/2021 with no targets for revascularization ?Secondary to noncompliance on Demadex ?Cardiology recommends: IV Lasix 60 mg bid has now been transitioned to Lasix orally 60 mg given appropriate rise in creatinine which is expected ?TOC will be consulted to ensure affordability of meds is not an issue ?-14 L till date, weight = 110 to 100 kg ?SVT on amiodarone/digoxin ?Resume amiodarone 200 daily--resume digoxin 0.125 ?Resume beta-blocker as per heart failure team-hold Toprol-XL 12.5 ?CKD 3A with underlying diabetes hypertension and peripheral vascular disease ?Prior BKA left side Dr. Sharol Given 07/2021 ?Continue Lasix as above ?Poorly controlled diabetes mellitus type A1c 10.1 not on insulin prior to admission ?Jardiance 10 mg-is $4 according to pharmacist ?Not a good candidate for metformin given rising creatinine-continue sliding scale at this time ?Started this admit glipizide 2.5 and titrated to 5 mg twice daily on 4/14 ?CBGs = 1 60-1 72 and patient does not wish to use any insulin according to diabetic coordinator input ?Left-sided BKA Dr. Sharol Given ?Stump seems clean-reassess as outpatient ?Non-compliance secondary to affordability of meds ? ? ?DVT prophylaxis: Lovenox ?Code Status: Full ?Family  Communication: None present ?Disposition:  ?Status is: Inpatient ?Remains inpatient appropriate because:  ? ?Has heart failure and is not euvolemic yet ?  ?Consultants:  ?Cardiology ? ?Procedures: None ? ?Antimicrobials: None yet ? ? ?Subjective: ? ?With regards to several psychosocial stressors-feels that he may benefit from counseling if he can get the same  ?we had a good robust discussion about getting him into community health and wellness for follow-up routine care-I did also mention to him that counseling if he wants it should be available and that that could be coordinated at that time ?He tells me in the past he has used Effexor and seems to want to resume this and trial this ? ? ?Objective: ?Vitals:  ? 11/06/21 2018 11/07/21 0409 11/07/21 0846 11/07/21 1133  ?BP: 116/79 121/84 (!) 124/92 112/73  ?Pulse: 89 89 89 89  ?Resp: 19 19 20 20   ?Temp: 98 ?F (36.7 ?C) (!) 97.5 ?F (36.4 ?C) 97.6 ?F (36.4 ?C) 97.6 ?F (36.4 ?C)  ?TempSrc: Oral Oral Oral   ?SpO2: 94% 91% 94% 90%  ?Weight:  100.2 kg    ? ? ?Intake/Output Summary (Last 24 hours) at 11/07/2021 1512 ?Last data filed at 11/07/2021 1352 ?Gross per 24 hour  ?Intake 1837 ml  ?Output 9500 ml  ?Net -7663 ml  ? ? ?Filed Weights  ? 11/05/21 0922 11/06/21 0640 11/07/21 0409  ?Weight: 110.9 kg 106 kg 100.2 kg  ? ? ?Examination: ? ?Awake coherent no distress ?No JVD ?Chest clear no rales rhonchi ?On the right side his leg seems less swollen than it has been-I have not examined his stump ?Animated ? ?Data Reviewed: personally reviewed  ? ?CBC ?   ?Component Value Date/Time  ?  WBC 8.6 11/06/2021 0120  ? RBC 3.53 (L) 11/06/2021 0120  ? HGB 9.9 (L) 11/06/2021 0120  ? HGB 9.9 (L) 05/12/2021 1502  ? HCT 30.5 (L) 11/06/2021 0120  ? HCT 30.9 (L) 05/12/2021 1502  ? PLT 246 11/06/2021 0120  ? PLT 272 05/12/2021 1502  ? MCV 86.4 11/06/2021 0120  ? MCV 89 05/12/2021 1502  ? MCH 28.0 11/06/2021 0120  ? MCHC 32.5 11/06/2021 0120  ? RDW 14.6 11/06/2021 0120  ? RDW 14.1 05/12/2021 1502   ? LYMPHSABS 3.0 11/06/2021 0120  ? LYMPHSABS 4.2 (H) 05/12/2021 1502  ? MONOABS 0.5 11/06/2021 0120  ? EOSABS 0.3 11/06/2021 0120  ? EOSABS 0.2 05/12/2021 1502  ? BASOSABS 0.1 11/06/2021 0120  ? BASOSABS 0.1 05/12/2021 1502  ? ? ?  Latest Ref Rng & Units 11/07/2021  ?  3:35 AM 11/06/2021  ? 11:02 AM 11/06/2021  ?  1:20 AM  ?CMP  ?Glucose 70 - 99 mg/dL 167   149   114    ?BUN 6 - 20 mg/dL 16   16   19     ?Creatinine 0.61 - 1.24 mg/dL 1.87   1.72   1.63    ?Sodium 135 - 145 mmol/L 133   139   138    ?Potassium 3.5 - 5.1 mmol/L 4.2   4.0   3.5    ?Chloride 98 - 111 mmol/L 97   101   104    ?CO2 22 - 32 mmol/L 29   32   27    ?Calcium 8.9 - 10.3 mg/dL 8.4   8.5   8.2    ?Total Protein 6.5 - 8.1 g/dL 5.4      ?Total Bilirubin 0.3 - 1.2 mg/dL 0.5      ?Alkaline Phos 38 - 126 U/L 79      ?AST 15 - 41 U/L 12      ?ALT 0 - 44 U/L 9      ? ? ? ?Radiology Studies: ?No results found. ? ? ?Scheduled Meds: ? amiodarone  200 mg Oral Daily  ? aspirin EC  81 mg Oral Daily  ? atorvastatin  80 mg Oral Daily  ? digoxin  0.125 mg Oral Daily  ? empagliflozin  10 mg Oral Daily  ? enoxaparin (LOVENOX) injection  40 mg Subcutaneous Q24H  ? eplerenone  25 mg Oral Daily  ? [START ON 11/08/2021] furosemide  60 mg Oral Daily  ? glipiZIDE  5 mg Oral BID AC  ? insulin aspart  0-15 Units Subcutaneous TID WC  ? living well with diabetes book   Does not apply Once  ? losartan  25 mg Oral Daily  ? [START ON 11/08/2021] venlafaxine XR  75 mg Oral Q breakfast  ? ?Continuous Infusions: ? ? LOS: 3 days  ? ?Time spent: 81 ? ?Nita Sells, MD ?[facility] ?To contact the attending provider between 7A-7P or the covering provider during after hours 7P-7A, please log into the web site [email or web address] and access using universal [place] password for that web site. If you do not have the password, please call the hospital operator. ? ?11/07/2021, 3:12 PM  ? ? ?

## 2021-11-07 NOTE — TOC Initial Note (Addendum)
Transition of Care (TOC) - Initial/Assessment Note  ? ? ?Patient Details  ?Name: Adam James ?MRN: [id number] ?Date of Birth: [birth year] ? ?Transition of Care [facility]) CM/SW Contact:    ?Mariea Stable Davene Costain, RN ?Phone Number: [phone] ?11/07/2021, 12:23 PM ? ?Clinical Narrative:                 ? ?HF TOC CM spoke to pt at bedside. States is currently living with dtr. States he is working on disability with an Pensions consultant. He was denied in Feb. Dtr will assist pt with appts. Appt arranged with PCP at [facility] on Dec 08, 2021 at 230 pm. He will be able to utilize the [facility] for his medications at discounted rate. Will assist pt with meds through MATCH/HF Funds. Pt requesting a tub bench. Attending updated.  ? ?Contacted [employer] for tub bench to be delivered to room. Meds will come up from [facility] pharmacy and hold for possible dc tomorrow.  ? ? ? ?Expected Discharge Plan: Home/Self Care ?Barriers to Discharge: Continued Medical Work up ? ? ?Patient Goals and CMS Choice ?  ?CMS Medicare.gov Compare Post Acute Care list provided to:: Patient ?Choice offered to / list presented to : Patient ? ?Expected Discharge Plan and Services ?Expected Discharge Plan: Home/Self Care ?In-house Referral: Clinical Social Work ?Discharge Planning Services: CM Consult ?  ?Living arrangements for the past 2 months: Apartment ?                ?DME Arranged: Tub bench ?DME Agency: [employer] ?Date DME Agency Contacted: 11/07/21 ?  ?Representative spoke with at DME Agency: Velna Hatchet ?  ?  ?  ?  ?  ? ?Prior Living Arrangements/Services ?Living arrangements for the past 2 months: Apartment ?Lives with:: Adult Children ?Patient language and need for interpreter reviewed:: Yes ?Do you feel safe going back to the place where you live?: Yes      ?Need for Family Participation in Patient Care: Yes (Comment) ?Care giver support system in place?: Yes (comment) ?Current home services: DME (rolling walker, wheelchair) ?Criminal  Activity/Legal Involvement Pertinent to Current Situation/Hospitalization: No - Comment as needed ? ?Activities of Daily Living ?  ?  ? ?Permission Sought/Granted ?Permission sought to share information with : Case Manager, Family Supports, PCP ?Permission granted to share information with : Yes, Verbal Permission Granted ? Share Information with NAME: [facility] ?   ? Permission granted to share info w Relationship: daughter ? Permission granted to share info w Contact Information: [phone] ? ?Emotional Assessment ?Appearance:: Appears stated age ?Attitude/Demeanor/Rapport: Engaged ?Affect (typically observed): Accepting ?Orientation: : Oriented to Self, Oriented to Place, Oriented to  Time, Oriented to Situation ?  ?Psych Involvement: No (comment) ? ?Admission diagnosis:  Acute on chronic systolic heart failure (HCC) [I50.23] ?Acute on chronic systolic CHF (congestive heart failure) (HCC) [I50.23] ?Patient Active Problem List  ? Diagnosis Date Noted  ? Acute on chronic systolic CHF (congestive heart failure) (HCC) 11/04/2021  ? Acute kidney injury superimposed on chronic kidney disease (HCC) 11/04/2021  ? Below-knee amputation of left lower extremity (HCC) 07/30/2021  ? 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 Acute osteomyelitis of left foot (HCC)   ? Acute exacerbation of CHF (congestive heart failure) (HCC) 04/28/2021  ? Venous stasis ulcer of right calf limited to breakdown of skin without varicose veins (HCC)   ? Non-pressure chronic ulcer of other part of left foot limited to breakdown of skin (HCC)   ? Subacute osteomyelitis,  left ankle and foot (HCC)   ? Acute on chronic combined systolic and diastolic CHF (congestive heart failure) (HCC)   ? Acute on chronic heart failure (HCC) 03/23/2021  ? NSTEMI (non-ST elevated myocardial infarction) (HCC)   ? Gastroesophageal reflux disease with esophagitis and hemorrhage   ? Anemia, posthemorrhagic, acute   ? Acute esophagitis   ? Gastrointestinal hemorrhage   ?  Necrotizing fasciitis (HCC)   ? Left leg cellulitis   ? Diabetic polyneuropathy associated with type 2 diabetes mellitus (HCC)   ? Severe protein-calorie malnutrition (HCC)   ? Gangrene of left foot (HCC) 02/25/2021  ? Type 2 diabetes mellitus (HCC)   ? Heart failure (HCC)   ? Dyslipidemia   ? Diabetic foot ulcer (HCC)   ? CAD (coronary artery disease)   ? Cardiomyopathy (HCC)   ? Coronary artery disease involving native coronary artery of native heart with angina pectoris (HCC)   ? ?PCP:  Pcp, No ?Pharmacy:   ?[facility] [place] [place] - [address] ?[address] ?[place] [address] ?Phone: [phone] Fax: [phone] ? ?[facility] ?[address] ?[place] [address] ?Phone: [phone] Fax: [phone] ? ? ? ? ?Social Determinants of Health (SDOH) Interventions ?Financial Strain Interventions: Other (Comment) (HF fund and [facility] referral) ?Housing Interventions: Intervention Not Indicated ? ?Readmission Risk Interventions ?   ? View : No data to display.  ?  ?  ?  ? ? ? ?

## 2021-11-08 LAB — BASIC METABOLIC PANEL
Anion gap: 6 (ref 5–15)
BUN: 20 mg/dL (ref 6–20)
CO2: 31 mmol/L (ref 22–32)
Calcium: 8.3 mg/dL — ABNORMAL LOW (ref 8.9–10.3)
Chloride: 101 mmol/L (ref 98–111)
Creatinine, Ser: 2.1 mg/dL — ABNORMAL HIGH (ref 0.61–1.24)
GFR, Estimated: 38 mL/min — ABNORMAL LOW (ref >60)
Glucose, Bld: 237 mg/dL — ABNORMAL HIGH (ref 70–99)
Potassium: 4.2 mmol/L (ref 3.5–5.1)
Sodium: 138 mmol/L (ref 135–145)

## 2021-11-08 LAB — GLUCOSE, CAPILLARY
Glucose-Capillary: 246 mg/dL — ABNORMAL HIGH (ref 70–99)
Glucose-Capillary: 125 mg/dL — ABNORMAL HIGH (ref 70–99)

## 2021-11-08 NOTE — Progress Notes (Signed)
Patient being discharged home. TOC medications delivered to room by RN. Discharge instructions reviewed and questions answered. Daughter to provide transportation. ?

## 2021-11-08 NOTE — Progress Notes (Signed)
? ? Advanced Heart Failure Rounding Note ? ?PCP-Cardiologist: Shirlee More, MD  ? ?Subjective:   ? ?Remains on IV lasix. Diuresed another 12 pounds. Weight down 35 pounds total ? ?Denies SOB, orthopnea or PND.  ? ?Scr 1.9 -> 2.1 ? ?Echo 11/06/21: ?LVEF 20-25%, RV moderately reduced, RVSP 47 mmHg, mild to moderate MR ? ?Objective:   ?Weight Range: ?95 kg ?Body mass index is 26.17 kg/m?.  ? ?Vital Signs:   ?Temp:  [97.6 ?F (36.4 ?C)-97.8 ?F (36.6 ?C)] 97.8 ?F (36.6 ?C) (04/15 0439) ?Pulse Rate:  [84-89] 84 (04/15 0439) ?Resp:  [19-20] 19 (04/14 2051) ?BP: (112-124)/(73-82) 119/74 (04/15 [id number]) ?SpO2:  [90 %-95 %] 95 % (04/15 0439) ?Weight:  [95 kg] 95 kg (04/15 0439) ?Last BM Date : 11/07/21 ? ?Weight change: ?Filed Weights  ? 11/06/21 0640 11/07/21 0409 11/08/21 0439  ?Weight: 106 kg 100.2 kg 95 kg  ? ? ?Intake/Output:  ? ?Intake/Output Summary (Last 24 hours) at 11/08/2021 1118 ?Last data filed at 11/08/2021 0836 ?Gross per 24 hour  ?Intake 1320 ml  ?Output 7250 ml  ?Net -5930 ml  ? ?  ? ? ?Physical Exam  ? ?General:  In bed. No resp difficulty ?HEENT: normal ?Neck: supple. no JVD. Carotids 2+ bilat; no bruits. No lymphadenopathy or thryomegaly appreciated. ?Cor: PMI nondisplaced. Regular rate & rhythm. No rubs, gallops or murmurs. ?Lungs: clear ?Abdomen: soft, nontender, nondistended. No hepatosplenomegaly. No bruits or masses. Good bowel sounds. ?Extremities: no cyanosis, clubbing, rash, edema s/p LBKA ?Neuro: alert & orientedx3, cranial nerves grossly intact. moves all 4 extremities w/o difficulty. Affect pleasant ? ? ?Telemetry  ? ?SR 80s personally checked.  ? ?Labs  ?  ?CBC ?Recent Labs  ?  11/06/21 ?0120  ?WBC 8.6  ?NEUTROABS 4.7  ?HGB 9.9*  ?HCT 30.5*  ?MCV 86.4  ?PLT 246  ? ? ?Basic Metabolic Panel ?Recent Labs  ?  11/06/21 ?0120 11/06/21 ?1102 11/07/21 ?[id number] 11/08/21 ?0354  ?NA 138   < > 133* 138  ?K 3.5   < > 4.2 4.2  ?CL 104   < > 97* 101  ?CO2 27   < > 29 31  ?GLUCOSE 114*   < > 167* 237*  ?BUN 19   < >  16 20  ?CREATININE 1.63*   < > 1.87* 2.10*  ?CALCIUM 8.2*   < > 8.4* 8.3*  ?MG 1.6*  --  2.2  --   ?PHOS 4.1  --   --   --   ? < > = values in this interval not displayed.  ? ? ?Liver Function Tests ?Recent Labs  ?  11/06/21 ?0120 11/07/21 ?0335  ?AST  --  12*  ?ALT  --  9  ?ALKPHOS  --  79  ?BILITOT  --  0.5  ?PROT  --  5.4*  ?ALBUMIN 2.1* 2.2*  ? ? ?No results for input(s): LIPASE, AMYLASE in the last 72 hours. ?Cardiac Enzymes ?No results for input(s): CKTOTAL, CKMB, CKMBINDEX, TROPONINI in the last 72 hours. ? ?BNP: ?BNP (last 3 results) ?Recent Labs  ?  07/11/21 ?1253 10/05/21 ?1506 11/04/21 ?1435  ?BNP 123.8* 933.3* 1,131.2*  ? ? ? ?ProBNP (last 3 results) ?No results for input(s): PROBNP in the last 8760 hours. ? ? ?D-Dimer ?No results for input(s): DDIMER in the last 72 hours. ?Hemoglobin A1C ?No results for input(s): HGBA1C in the last 72 hours. ? ?Fasting Lipid Panel ?Recent Labs  ?  11/06/21 ?0120  ?CHOL 171  ?HDL 39*  ?  LDLCALC 112*  ?TRIG 100  ?CHOLHDL 4.4  ? ? ?Thyroid Function Tests ?Recent Labs  ?  11/06/21 ?0120  ?TSH 3.942  ? ? ? ?Other results: ? ? ?Imaging  ? ? ?No results found. ? ? ?Medications:   ? ? ?Scheduled Medications: ? amiodarone  200 mg Oral Daily  ? aspirin EC  81 mg Oral Daily  ? atorvastatin  80 mg Oral Daily  ? digoxin  0.125 mg Oral Daily  ? empagliflozin  10 mg Oral Daily  ? enoxaparin (LOVENOX) injection  40 mg Subcutaneous Q24H  ? eplerenone  25 mg Oral Daily  ? furosemide  60 mg Oral Daily  ? glipiZIDE  5 mg Oral BID AC  ? insulin aspart  0-15 Units Subcutaneous TID WC  ? living well with diabetes book   Does not apply Once  ? losartan  25 mg Oral Daily  ? venlafaxine XR  75 mg Oral Q breakfast  ? ? ?Infusions: ? ? ?PRN Medications: ? ? ? ? ?Patient Profile  ? ?51 y.o. male with history of chronic systolic CHF, CAD s/p PCI 2007 and CABG 10/22, uncontrolled DM, left foot infection c/b osteomyelitis and multiple debridements with ultimate BKA in 01/23, nonadherence with medical  therapy. Now admitted with a/c systolic CHF. ? ?Assessment/Plan  ? ?1. Acute on chronic systolic CHF: Ischemic cardiomyopathy.  Initial echo in 2/22 with EF 40-45%.  Echo in 7/22 with EF 30-35%.  Echo 8/22 EF down to 25% with mild RV dysfunction and dilated IVC.  RHC 8/22 with preserved CO, mildly elevated PCWP, mild pulmonary hypertension.  ?- S/p CABG 10/22 ?- Had not been taking diuretics since 12/22. Missed f/u visit in 01/23.  Seen in ED 03/23 with a/c CHF and restarted diuretic. Cancelled f/u 04/05.  ?- Echo this admit: EF 20-25%, RV moderately reduced, mild to moderate MR ?- Suspect some of the reason EF not improving is d/t noncompliance. ?- Has diuresed well. Now dry. Scr up a bit.  ?- No beta blocker for now with a/c systolic CHF  ?- Continue Jardiance 10 mg daily ?- Does not want to take spiro d/t concerns it may lower testosterone levels, started eplerenone 25 mg daily ?- Continue digoxin 0.125 mg daily ?- Continue losartan 25 mg daily. Entresto not on HF fund. ?- Ok for d/c today. Ia sked him to hold lasix for 2 days and restart on Tuesday ?2. CAD: Hx PCI 2007. S/p CABG x 3 with LIMA-LAD, SVG-OM2, and SVG-OM3 10/22. No chest pain.  ?- Continue ASA 81 daily.  ?- Continue Atorvastatin 80 mg daily ?- HS troponin mildly elevated with no trend. Doubt ACS. Continue medical management. ?3. PVCs/VT: ?- Noted 10/22 post CABG. ?- Suppressed this admit ?- Has been on 200 mg Amiodarone daily. Continue  ?4. AKI on CKD, 3a:  ?- Creatinine variable over last year but baseline seems to be 1.4-1.7,  up slightly today in setting of diuresis. Hold diuretics for 1-2 days then resume po  ?5. GI bleeding: History of erosive esophagitis earlier in 8/22.  He is now on Protonix.   ?- 4/13 Hgb stable at 9.9 ?6. Left leg diabetic foot infection ?- Underwent left BKA in 01/23. ?- Plans to get fitted for prosthesis ?7. DM2: Needs a PCP/endocrinologist.  ?- A1c 10.1.  ?- Ran out of medications at home ?- Management per primary ?8.  SDOH:  ?-Uninsured. Reports disability and medicaid were previously denied. Engage HF TOC CSW to assist. ?-Lives in [place],  [place] [facility] Discussed enrollment in paramedicine program, refused. ?-Will need medications sent to [facility] at discharge through HF fund ?-Reports his daughter is able to provide reliable transportation in the future ? ?Refused PT/OT evaluation. ? ?HF F/U placed on chart.  ? ?HF Meds for d/c  ?Lasix 60 mg daily (hold for 1-2 days post discharge then start) - I discussed with him prior to d/c ?Amiodarone 200 mg daily  ?Aspirin 81 mg daily  ?Atorvastatin 80 mg daily ?Digoxin 0.125 mg daily  ?Jardiance 10 mg daily  ?Losartan 25 mg daily  ?Eplerenone 25 mg daily  ? ?Length of Stay: 4 ? ?Glori Bickers, MD  ?11/08/2021, 11:18 AM ? ?Advanced Heart Failure Team ?Pager [phone] (M-F; 7a - 5p)  ?Please contact [facility] for night-coverage after hours (5p -7a ) and weekends on [email or web address] ?  ? ? ?

## 2021-11-08 NOTE — Discharge Summary (Signed)
Physician Discharge Summary  ?Adam James [id number] DOB: 13-Dec-1970 DOA: 11/04/2021 ? ?PCP: Pcp, No ? ?Admit date: 11/04/2021 ?Discharge date: 11/08/2021 ? ?Time spent: 46 minutes ? ?Recommendations for Outpatient Follow-up:  ?Requires Chem-12 CBC magnesium 1 week outpatient setting ?TOC has arranged outpatient follow-up community health and wellness which patient should keep appointment for and states that he does have transport--- also appreciate involvement arranging labs letter and meds for the patient and he should follow-up with health and wellness ?Please screen in the outpatient setting in about 4 weeks with PHQ-9 or other device as he was restarted on Effexor which she has used in the past before ? ?Discharge Diagnoses:  ?MAIN problem for hospitalization ? ? decompensated heart failure ? ?Please see below for itemized issues addressed in HOpsital- ?refer to other progress notes for clarity if needed ? ?Discharge Condition: Improved ? ?Diet recommendation: Heart healthy low-salt ? ?Filed Weights  ? 11/06/21 0640 11/07/21 0409 11/08/21 0439  ?Weight: 106 kg 100.2 kg 95 kg  ? ? ?51 year old home dwelling white male CABG 04/2021, HFrEF EF 25% on Demadex, SVT on amiodarone DM TY 2, CKD 3 (baseline creatinine 1.5) BKA left side Dr. Sharol Given 07/2021 ?Missed several doses of Demadex secondary to miscommunication and patient held the same for over 1 month ?Presented to ED with overall worsening of shortness of breath ?BNP 1130 troponin gray zone 27 (prior troponin 35 10/05/2021) ?Found to be in acute heart failure  ?Cardiology consulted for recommendations and management ?  ?Hospital-Problem based course ?  ?Acute decompensated systolic heart failure-last EF 25% (dry weight 242 pounds?) ?CABG 04/2021 with no targets for revascularization ?Secondary to noncompliance on Demadex ?Cardiology recommends: IV Lasix 60 mg bid -->Lasix orally 60 mg ?started also on Jardiance 10 losartan 25 eplerenone 25 ?He will need labs in  a week inclusive of magnesium and it would be preferable to keep him dry at the expense of mild volume depletion to keep him out of the hospital ?-17 L till date, weight = 110 to new dry weight of 95 kg on discharge ?SVT on amiodarone/digoxin ?Resume amiodarone 200 daily--resume digoxin 0.125 ?Beta-blocker held per cardiology (Toprol-XL 12.5) ?CKD 3A with underlying diabetes hypertension and peripheral vascular disease ?Prior BKA left side Dr. Sharol Given 07/2021 ?Continue Lasix as above ?Poorly controlled diabetes mellitus type A1c 10.1 not on insulin prior to admission ?Jardiance 10 mg-is $4 according to pharmacist ?Not a good candidate for metformin given rising creatinine-continue sliding scale at this time ?Started this admit glipizide 2.5 and titrated to 5 mg twice daily on 4/14 ?CBGs = moderately controlled and patient does not wish to use any insulin  ?Left-sided BKA Dr. Sharol Given ?Stump seems clean-reassess as outpatient ?Non-compliance secondary to affordability of meds ?Moderate to severe depression secondary to all of the above ?resumed his Effexor 37.5 this admission ? ?Has follow-up appointment with community health and wellness ?Dec 08, 2021 at 230 pm ? ? ?Discharge Exam: ?Vitals:  ? 11/08/21 0439 11/08/21 0806  ?BP: 124/80 119/74  ?Pulse: 84   ?Resp:    ?Temp: 97.8 ?F (36.6 ?C)   ?SpO2: 95%   ? ? ?Subj on day of d/c ?  ?Awake alert coherent and better mood and spirits ? ?General Exam on discharge ? ?NCAT no focal deficit ?No icterus no pallor ?Chest clear no rales or rhonchi ?Rate controlled A-fib with PVCs ?Abdomen soft nontender no guarding ?ROM intact ? ? ?Discharge Instructions ? ? ?Discharge Instructions   ? ? Diet - low sodium heart  healthy   Complete by: As directed ?  ? Discharge instructions   Complete by: As directed ?  ? Make sure you take ur meds as indicated ?Get labs ~ 1 week--make sure u adhere to fluid restrictions ?Please follow up with Community health and wellness for further care  ? Increase  activity slowly   Complete by: As directed ?  ? ?  ? ?Allergies as of 11/08/2021   ? ?   Reactions  ? Morphine Itching  ? ?  ? ?  ?Medication List  ?  ? ?STOP taking these medications   ? ?glyBURIDE 5 MG tablet ?Commonly known as: DIABETA ?  ?metFORMIN 500 MG tablet ?Commonly known as: GLUCOPHAGE ?  ?metoprolol succinate 25 MG 24 hr tablet ?Commonly known as: TOPROL-XL ?  ?torsemide 20 MG tablet ?Commonly known as: DEMADEX ?  ? ?  ? ?TAKE these medications   ? ?albuterol 108 (90 Base) MCG/ACT inhaler ?Commonly known as: VENTOLIN HFA ?Inhale 2 puffs into the lungs every 4 (four) hours as needed for shortness of breath or wheezing. ?  ?amiodarone 200 MG tablet ?Commonly known as: PACERONE ?Take 1 tablet (200 mg total) by mouth daily. ?  ?Aspirin Low Dose 81 MG EC tablet ?Generic drug: aspirin ?Take 1 tablet (81 mg total) by mouth daily. ?What changed: additional instructions ?  ?atorvastatin 80 MG tablet ?Commonly known as: LIPITOR ?Take 1 tablet (80 mg total) by mouth daily. ?  ?digoxin 0.125 MG tablet ?Commonly known as: LANOXIN ?Take 1 tablet (0.125 mg total) by mouth daily. ?  ?eplerenone 25 MG tablet ?Commonly known as: INSPRA ?Take 1 tablet (25 mg total) by mouth daily. ?  ?furosemide 40 MG tablet ?Commonly known as: LASIX ?Take 1.5 tablets (60 mg total) by mouth daily. ?What changed: how much to take ?  ?glipiZIDE 5 MG tablet ?Commonly known as: GLUCOTROL ?Take 1 tablet (5 mg total) by mouth 2 (two) times daily before a meal. ?  ?Jardiance 10 MG Tabs tablet ?Generic drug: empagliflozin ?Take 1 tablet (10 mg total) by mouth daily. ?  ?losartan 25 MG tablet ?Commonly known as: COZAAR ?Take 1 tablet (25 mg total) by mouth daily. ?  ?True Metrix Blood Glucose Test test strip ?Generic drug: glucose blood ?Use as instructed twice daily ?  ?TRUEplus Lancets 28G Misc ?Use as directed twice daily at 8 am and 10 pm. ?  ?venlafaxine XR 75 MG 24 hr capsule ?Commonly known as: EFFEXOR-XR ?Take 1 capsule (75 mg total) by  mouth daily with breakfast. ?  ? ?  ? ?  ?  ? ? ?  ?Durable Medical Equipment  ?(From admission, onward)  ?  ? ? ?  ? ?  Start     Ordered  ? 11/07/21 1248  For home use only DME Tub bench  Once       ? 11/07/21 1248  ? ?  ?  ? ?  ? ?Allergies  ?Allergen Reactions  ? Morphine Itching  ? ? Follow-up Information   ? ? [facility]. Go on 11/19/2021.   ?Specialty: Cardiology ?Why: 2:30 PM, Advanced Heart Failure Clinic, Entrance C, parking code 1104 ?Contact information: ?[address] ?[id number] mc ?[place] ?[phone] ? ?  ?  ? ?  ?  ? ?  ? ? ? ?The results of significant diagnostics from this hospitalization (including imaging, microbiology, ancillary and laboratory) are listed below for reference.   ? ?Significant  Diagnostic Studies: ?DG Chest 2 View ? ?Result Date: 11/04/2021 ?CLINICAL DATA:  Shortness of breath. EXAM: CHEST - 2 VIEW COMPARISON:  10/05/2021 FINDINGS: The cardiac silhouette, mediastinal and hilar contours are within normal limits and stable. Stable surgical changes from bypass surgery. Increased interstitial markings and mild peribronchial thickening suggesting interstitial pulmonary edema. There are also small bilateral pleural effusions. No pulmonary infiltrates or pneumothorax. The bony thorax is intact. IMPRESSION: Interstitial pulmonary edema and bilateral pleural effusions. Electronically Signed   By: Marijo Sanes M.D.   On: 11/04/2021 15:13  ? ?ECHOCARDIOGRAM COMPLETE ? ?Result Date: 11/05/2021 ?   ECHOCARDIOGRAM REPORT   Patient Name:   Adam James Date of Exam: 11/05/2021 Medical Rec #:  [id number]        Height:       75.0 in Accession #:    [id number]       Weight:       220.0 lb Date of Birth:  1970-11-09        BSA:          2.285 m? Patient Age:    8 years         BP:           133/92 mmHg Patient Gender: M                HR:           99 bpm. Exam Location:  Inpatient Procedure: 2D Echo, Cardiac Doppler,  Color Doppler and Intracardiac            Opacification Agent Indications:    CHF-Acute Systolic [id number]  History:        Patient has prior history of Echocardiogram examinations, most                 recent

## 2021-11-17 ENCOUNTER — Telehealth (HOSPITAL_COMMUNITY): Payer: Self-pay

## 2021-11-17 NOTE — Telephone Encounter (Signed)
Called to confirm/remind patient of their appointment at the Advanced [facility] Failure Clinic on 11/19/21.  ? ?Patient reminded to bring all medications and/or complete list. ? ?Confirmed patient has transportation. Gave directions, instructed to utilize valet parking. ? ?Confirmed appointment prior to ending call.  ? ?

## 2021-11-19 ENCOUNTER — Ambulatory Visit (HOSPITAL_COMMUNITY)
Admit: 2021-11-19 | Discharge: 2021-11-19 | Disposition: A | Discharge status: Home / Self Care | Payer: Self-pay | Attending: Family Medicine | Admitting: Family Medicine

## 2021-11-19 ENCOUNTER — Other Ambulatory Visit (HOSPITAL_COMMUNITY): Payer: Self-pay | Admitting: Family Medicine

## 2021-11-19 ENCOUNTER — Other Ambulatory Visit (HOSPITAL_COMMUNITY): Payer: Self-pay

## 2021-11-19 ENCOUNTER — Encounter (HOSPITAL_COMMUNITY): Payer: Self-pay

## 2021-11-19 VITALS — BP 112/68 | HR 88 | Ht 75.0 in | Wt 197.8 lb

## 2021-11-19 DIAGNOSIS — E118 Type 2 diabetes mellitus with unspecified complications: Secondary | ICD-10-CM

## 2021-11-19 DIAGNOSIS — I5022 Chronic systolic (congestive) heart failure: Secondary | ICD-10-CM | POA: Insufficient documentation

## 2021-11-19 DIAGNOSIS — L089 Local infection of the skin and subcutaneous tissue, unspecified: Secondary | ICD-10-CM | POA: Insufficient documentation

## 2021-11-19 DIAGNOSIS — Z89619 Acquired absence of unspecified leg above knee: Secondary | ICD-10-CM

## 2021-11-19 DIAGNOSIS — Z951 Presence of aortocoronary bypass graft: Secondary | ICD-10-CM | POA: Insufficient documentation

## 2021-11-19 DIAGNOSIS — E785 Hyperlipidemia, unspecified: Secondary | ICD-10-CM | POA: Insufficient documentation

## 2021-11-19 DIAGNOSIS — E11628 Type 2 diabetes mellitus with other skin complications: Secondary | ICD-10-CM | POA: Insufficient documentation

## 2021-11-19 DIAGNOSIS — I5043 Acute on chronic combined systolic (congestive) and diastolic (congestive) heart failure: Secondary | ICD-10-CM

## 2021-11-19 DIAGNOSIS — I25118 Atherosclerotic heart disease of native coronary artery with other forms of angina pectoris: Secondary | ICD-10-CM

## 2021-11-19 DIAGNOSIS — Z7982 Long term (current) use of aspirin: Secondary | ICD-10-CM | POA: Insufficient documentation

## 2021-11-19 DIAGNOSIS — Z8719 Personal history of other diseases of the digestive system: Secondary | ICD-10-CM

## 2021-11-19 DIAGNOSIS — N183 Chronic kidney disease, stage 3 unspecified: Secondary | ICD-10-CM

## 2021-11-19 DIAGNOSIS — Z7984 Long term (current) use of oral hypoglycemic drugs: Secondary | ICD-10-CM | POA: Insufficient documentation

## 2021-11-19 DIAGNOSIS — Z79899 Other long term (current) drug therapy: Secondary | ICD-10-CM | POA: Insufficient documentation

## 2021-11-19 DIAGNOSIS — Z139 Encounter for screening, unspecified: Secondary | ICD-10-CM

## 2021-11-19 DIAGNOSIS — N1831 Chronic kidney disease, stage 3a: Secondary | ICD-10-CM | POA: Insufficient documentation

## 2021-11-19 DIAGNOSIS — I130 Hypertensive heart and chronic kidney disease with heart failure and stage 1 through stage 4 chronic kidney disease, or unspecified chronic kidney disease: Secondary | ICD-10-CM | POA: Insufficient documentation

## 2021-11-19 DIAGNOSIS — I272 Pulmonary hypertension, unspecified: Secondary | ICD-10-CM | POA: Insufficient documentation

## 2021-11-19 DIAGNOSIS — I493 Ventricular premature depolarization: Secondary | ICD-10-CM | POA: Insufficient documentation

## 2021-11-19 DIAGNOSIS — E1122 Type 2 diabetes mellitus with diabetic chronic kidney disease: Secondary | ICD-10-CM | POA: Insufficient documentation

## 2021-11-19 DIAGNOSIS — E11621 Type 2 diabetes mellitus with foot ulcer: Secondary | ICD-10-CM | POA: Insufficient documentation

## 2021-11-19 DIAGNOSIS — E1169 Type 2 diabetes mellitus with other specified complication: Secondary | ICD-10-CM | POA: Insufficient documentation

## 2021-11-19 DIAGNOSIS — I2510 Atherosclerotic heart disease of native coronary artery without angina pectoris: Secondary | ICD-10-CM | POA: Insufficient documentation

## 2021-11-19 DIAGNOSIS — Z89512 Acquired absence of left leg below knee: Secondary | ICD-10-CM | POA: Insufficient documentation

## 2021-11-19 DIAGNOSIS — I255 Ischemic cardiomyopathy: Secondary | ICD-10-CM | POA: Insufficient documentation

## 2021-11-19 LAB — BASIC METABOLIC PANEL
Anion gap: 6 (ref 5–15)
BUN: 63 mg/dL — ABNORMAL HIGH (ref 6–20)
CO2: 24 mmol/L (ref 22–32)
Calcium: 9.3 mg/dL (ref 8.9–10.3)
Chloride: 106 mmol/L (ref 98–111)
Creatinine, Ser: 2.08 mg/dL — ABNORMAL HIGH (ref 0.61–1.24)
GFR, Estimated: 38 mL/min — ABNORMAL LOW (ref >60)
Glucose, Bld: 173 mg/dL — ABNORMAL HIGH (ref 70–99)
Potassium: 4.9 mmol/L (ref 3.5–5.1)
Sodium: 136 mmol/L (ref 135–145)

## 2021-11-19 LAB — DIGOXIN LEVEL: Digoxin Level: 0.8 ng/mL (ref 0.8–2.0)

## 2021-11-19 MED ORDER — FUROSEMIDE 40 MG PO TABS: 40.0000 mg | ORAL_TABLET | Freq: Every day | ORAL | 11 refills | Status: DC

## 2021-11-19 NOTE — Patient Instructions (Signed)
There has been no changes to your medications. ? ?Labs done today, your results will be available in MyChart, we will contact you for abnormal readings. ? ?Your physician recommends that you schedule a follow-up appointment in: 4 weeks with the Nurse Practitioner and 3 months with Dr. Aundra Dubin ? ?If you have any questions or concerns before your next appointment please send [place] a message through [place] or call our office at [phone].   ? ?TO LEAVE A MESSAGE FOR THE NURSE SELECT OPTION 2, PLEASE LEAVE A MESSAGE INCLUDING: ?YOUR NAME ?DATE OF BIRTH ?CALL BACK NUMBER ?REASON FOR CALL**this is important as we prioritize the call backs ? ?YOU WILL RECEIVE A CALL BACK THE SAME DAY AS LONG AS YOU CALL BEFORE 4:00 PM ? ?At the [facility], you and your health needs are our priority. As part of our continuing mission to provide you with exceptional heart care, we have created designated Provider Care Teams. These Care Teams include your primary Cardiologist (physician) and Advanced Practice Providers (APPs- Physician Assistants and Nurse Practitioners) who all work together to provide you with the care you need, when you need it.  ? ?You may see any of the following providers on your designated Care Team at your next follow up: ?Dr Glori Bickers ?Dr Loralie Champagne ?Darrick Grinder, NP ?Lyda Jester, PA ?Jessica Milford,NP ?Marlyce Huge, PA ?Audry Riles, PharmD ? ? ?Please be sure to bring in all your medications bottles to every appointment.  ?  ? ?

## 2021-11-19 NOTE — Progress Notes (Signed)
?Advanced [facility] Failure Clinic Note  ? ? ?PCP: [place] ?PCP-Cardiologist: Shirlee More, MD  ?HF Cardiologist: Dr. Aundra Dubin ? ?HPI: ?[name] is a 51 y.o.with a history CAD, Had PCI 2007 in [place]. 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 ?  ?Admitted to [facility] in February 2022 with lower extremity edema and foot ulcer. BNP and HS Trop elevated and felt to be non-ACS.  Cardiology consulted and he was placed on lasix.  ?  ?He had follow up with Dr Bettina Gavia 09/09/20 and was set up for cath. Cath with multivessel CAD. Saw Dr Kipp Brood in the office with plans for CMRI. He was referred to SW due to financial concerns and no payor source. CMRI showed EF 40% and viable myocardium.   ?  ?Saw Dr Kipp Brood in July 2022 and was set up for CABG in August. Pre CABG labs completed and showed WBC 25. He was advised to go to the ED.  ?  ?Admitted 02/25/2021 with left foot infection complicated by osteomyelitis and necrotizing fasciitis. 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 Hospital course complicated by erosive esophagitis on EGD. Recommendations for protonix 40 mg bid + repeat EGD in 8 weeks. Received multiple transfusions. Discharged 03/15/21.  ?  ?Admitted 03/23/21 with A/C CHF exacerbation. Started on IV lasix. Cardiology consulted. CO-OX 55%. Given 80 mg IV x1. V lasix increased to 100 mg tid and started on metolazone.  Repeat echo on 03/23/21 with LVEF of 25%, LV demonstrating global hypokinesis, and LV diastolic parameters consistent with grade II diastolic dysfunction. RHC performed 03/26/21 showed preserved cardiac output, mildly elevated PCWP, and mild pulmonary venous hypertension. LHC performed on 03/28/21 showed  minimal change from prior study in 2/22. Ost LAD to Mid LAD 90% stenosed, mid LAD to Dist LAD 90% stenosed, dist LAD lesion  100% stenosed.prox Cx 70% stenosed, prox RCA to Mid RCA 100% stenosed, dist RCA 100% stenosed, ramus 70% stenosed, 1st Mrg 50% stenosed, mid LM to Dist LM 60% stenosed. Hospitalization c/b AKI on CKD 3.  ? ?After full treatment of foot infection, he was admitted in 10/22 for CABG with LIMA-LAD, SVG-OM1, SVG-OM3.  PDA too small to graft.  ? ?Follow up 12/22, he was hypovolemic and spiro and torsemide stopped. He was to be seen for f/u in 3 weeks but did not show up for the appointment.  ?  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 ? ?Admitted 4/23 with a/c CHF. Diuresed with IV lasix. Echo showed EF 20-25%, RV moderately reduced, mild to moderate MR. GDMT restarted. Discharged home, weight 209 lbs. Refused paramedicine and PT/OT. ? ?Today he returns for post hospital HF follow up with his daughter. Overall feeling fine, mainly fatigued. Gets around the house OK with walker, no significant dyspnea. Denies palpitations, abnormal bleeding, CP, dizziness, edema, or PND/Orthopnea. Appetite ok. No fever or chills. Does not weigh at home. Taking all medications. He lives with his daughter, she helps with transportation. ? ?ECG (personally reviewed): NSR + LVH 93 bpm ? ?Labs (11/22): K 4.2, creatinine 1.38 ?Labs (12/22): K 4.4, creatinine 1.94 ?Labs (4/23): K 4.2, creatinine 2.1 ? ?PMH: ?1. GI bleeding: Erosive esophagitis 8/22.  ?2. CKD stage 3 ?3. Type 2 diabetes ?4. Hyperlipidemia ?5. Left foot/leg diabetic infection with necrotizing fasciitis: 8/22, debridements were done and the left 5th metatarsal was amputated.   ?6. Prior smoker ?7. H/o PVC, VT: On  amiodarone.  ?8. CAD: LHC in 9/22 with severe 3 vessel disease.  ?- CABG (10/22) with LIMA-LAD, SVG-OM2, SVG-OM3.  ?9. Chronic systolic CHF: Ischemic cardiomyopathy.  ?- Echo (8/22): LVEF of 25%, LV demonstrating global hypokinesis, and LV diastolic parameters consistent with grade II diastolic dysfunction.  ?- Echo (4/23): EF 20-25%, severe LV dysfunction, RV  moderately reduced, RVSP 46.8 mmHg, mild to moderate MR ? ?Review of Systems: All systems reviewed and negative except as per HPI.  ? ? ?Current Outpatient Medications  ?Medication Sig Dispense Refill  ? amiodarone (PACERONE) 200 MG tablet Take 1 tablet (200 mg total) by mouth daily. 30 tablet 3  ? aspirin 81 MG EC tablet Take 1 tablet (81 mg total) by mouth daily. 30 tablet 11  ? atorvastatin (LIPITOR) 80 MG tablet Take 1 tablet (80 mg total) by mouth daily. 30 tablet 3  ? digoxin (LANOXIN) 0.125 MG tablet Take 1 tablet (0.125 mg total) by mouth daily. 30 tablet 1  ? empagliflozin (JARDIANCE) 10 MG TABS tablet Take 1 tablet (10 mg total) by mouth daily. 30 tablet 11  ? eplerenone (INSPRA) 25 MG tablet Take 1 tablet (25 mg total) by mouth daily. 30 tablet 11  ? furosemide (LASIX) 40 MG tablet Take 1.5 tablets (60 mg total) by mouth daily. 45 tablet 11  ? glipiZIDE (GLUCOTROL) 5 MG tablet Take 1 tablet (5 mg total) by mouth 2 (two) times daily before a meal. 60 tablet 2  ? glucose blood (TRUE METRIX BLOOD GLUCOSE TEST) test strip Use as instructed twice daily 100 each 12  ? losartan (COZAAR) 25 MG tablet Take 1 tablet (25 mg total) by mouth daily. 30 tablet 11  ? TRUEplus Lancets 28G MISC Use as directed twice daily at 8 am and 10 pm. 100 each 11  ? venlafaxine XR (EFFEXOR-XR) 75 MG 24 hr capsule Take 1 capsule (75 mg total) by mouth daily with breakfast. 30 capsule 2  ? albuterol (VENTOLIN HFA) 108 (90 Base) MCG/ACT inhaler Inhale 2 puffs into the lungs every 4 (four) hours as needed for shortness of breath or wheezing. (Patient not taking: Reported on 11/19/2021)    ? ?No current facility-administered medications for this encounter.  ? ?Allergies  ?Allergen Reactions  ? Morphine Itching  ? ?Social History  ? ?Socioeconomic History  ? Marital status: Single  ?  Spouse name: Not on file  ? Number of children: 1  ? Years of education: Not on file  ? Highest education level: 8th grade  ?Occupational History  ?  Occupation: none  ?  Comment: former "[employer]"  ?Tobacco Use  ? Smoking status: Former  ?  Packs/day: 3.00  ?  Years: 36.00  ?  Pack years: 108.00  ?  Types: Cigarettes  ?  Quit date: 08/27/2020  ?  Years since quitting: 1.2  ? Smokeless tobacco: Never  ? Tobacco comments:  ?  5-6 daily  ?Vaping Use  ? Vaping Use: Never used  ?Substance and Sexual Activity  ? Alcohol use: Not Currently  ?  Comment: Very rare  ? Drug use: Not Currently  ?  Types: Marijuana  ? Sexual activity: Not on file  ?Other Topics Concern  ? Not on file  ?Social History Narrative  ? Not on file  ? ?Social Determinants of Health  ? ?Financial Resource Strain: High Risk  ? Difficulty of Paying Living Expenses: Hard  ?Food Insecurity: No Food Insecurity  ? Worried About Charity fundraiser in the Last  Year: Never true  ? Ran Out of Food in the Last Year: Never true  ?Transportation Needs: No Transportation Needs  ? Lack of Transportation (Medical): No  ? Lack of Transportation (Non-Medical): No  ?Physical Activity: Not on file  ?Stress: Not on file  ?Social Connections: Not on file  ?Intimate Partner Violence: Not on file  ? ?Family History  ?Problem Relation Age of Onset  ? Hypertension Mother   ? Heart failure Father   ? Hypertension Brother   ? Congestive Heart Failure Brother   ? ?BP 112/68   Pulse 88   Ht 6\' 3"  (1.905 m)   Wt 89.7 kg   SpO2 98%   BMI 24.72 kg/m?  ? ?Wt Readings from Last 3 Encounters:  ?11/19/21 89.7 kg  ?11/08/21 95 kg  ?07/30/21 99.8 kg  ? ?PHYSICAL EXAM: ?General:  NAD. No resp difficulty, arrived in [facility], chronically-ill appearing. ?HEENT: Normal ?Neck: Supple. No JVD. Carotids 2+ bilat; no bruits. No lymphadenopathy or thryomegaly appreciated. ?Cor: PMI nondisplaced. Regular rate & rhythm. No rubs, gallops or murmurs. ?Lungs: Clear ?Abdomen: Soft, nontender, nondistended. No hepatosplenomegaly. No bruits or masses. Good bowel sounds. ?Extremities: No cyanosis, clubbing, rash, edema s/p L BKA ?Neuro: Alert & oriented x 3,  cranial nerves grossly intact. Moves all 4 extremities w/o difficulty. Affect pleasant. ? ?ASSESSMENT & PLAN: ?1. Chronic systolic CHF: Ischemic cardiomyopathy.  Initial echo in 2/22 with EF 40-45%.  Echo in 7/22 with

## 2021-11-19 NOTE — Progress Notes (Signed)
CSW and paramedic met with pt in clinic to discuss paramedicine enrollment.  Pt is not agreeable at this time- doesn't feel as if he needs it.   ? ?CSW discussed with provider- will not enroll at this time ? ?[name], LCSW ?Clinical Social Worker ?[facility] Failure [facility] ?Desk#: [phone] ?Cell#: [phone] ? ?

## 2021-11-19 NOTE — Progress Notes (Signed)
Paramedicine Encounter ? ? ? Patient ID: Adam James, male    DOB: July 14, 1971, 51 y.o.   MRN: [id number] ? ? ?Met with [name] in the clinic today with Tammy Sours LCSW. We discussed paramedicine services with him and he refused me to come out and states he did not want anyone coming out to check on him. Refusal noted and patient will be removed from paramedicine list.  ? ? ? ? ?ACTION: ?Next visit planned for -refused services. ? ? ? ? ? ? ?

## 2021-11-26 ENCOUNTER — Telehealth: Payer: Self-pay | Admitting: Orthopedic Surgery

## 2021-11-26 NOTE — Telephone Encounter (Signed)
Called to sch follow up with Duda/Erin per AF notes ? ?"Can you please call the pt and sch follow up with Sharol Given or Junie Panning next week or so? He has not been in since Feb and has not shown for his prosthetic eval. Thanks!" ?

## 2021-12-01 ENCOUNTER — Other Ambulatory Visit (HOSPITAL_COMMUNITY): Payer: Medicaid Other

## 2021-12-08 ENCOUNTER — Ambulatory Visit: Payer: Self-pay | Attending: Family Medicine | Admitting: Family Medicine

## 2021-12-08 ENCOUNTER — Encounter: Payer: Self-pay | Admitting: Family Medicine

## 2021-12-08 ENCOUNTER — Other Ambulatory Visit: Payer: Self-pay

## 2021-12-08 VITALS — BP 106/75 | HR 91

## 2021-12-08 DIAGNOSIS — I5022 Chronic systolic (congestive) heart failure: Secondary | ICD-10-CM | POA: Insufficient documentation

## 2021-12-08 DIAGNOSIS — I130 Hypertensive heart and chronic kidney disease with heart failure and stage 1 through stage 4 chronic kidney disease, or unspecified chronic kidney disease: Secondary | ICD-10-CM | POA: Insufficient documentation

## 2021-12-08 DIAGNOSIS — G546 Phantom limb syndrome with pain: Secondary | ICD-10-CM

## 2021-12-08 DIAGNOSIS — Z951 Presence of aortocoronary bypass graft: Secondary | ICD-10-CM | POA: Insufficient documentation

## 2021-12-08 DIAGNOSIS — N183 Chronic kidney disease, stage 3 unspecified: Secondary | ICD-10-CM | POA: Insufficient documentation

## 2021-12-08 DIAGNOSIS — I080 Rheumatic disorders of both mitral and aortic valves: Secondary | ICD-10-CM | POA: Insufficient documentation

## 2021-12-08 DIAGNOSIS — Z89512 Acquired absence of left leg below knee: Secondary | ICD-10-CM | POA: Insufficient documentation

## 2021-12-08 DIAGNOSIS — E1142 Type 2 diabetes mellitus with diabetic polyneuropathy: Secondary | ICD-10-CM | POA: Insufficient documentation

## 2021-12-08 DIAGNOSIS — Z1211 Encounter for screening for malignant neoplasm of colon: Secondary | ICD-10-CM

## 2021-12-08 DIAGNOSIS — N179 Acute kidney failure, unspecified: Secondary | ICD-10-CM | POA: Insufficient documentation

## 2021-12-08 DIAGNOSIS — N189 Chronic kidney disease, unspecified: Secondary | ICD-10-CM

## 2021-12-08 DIAGNOSIS — Z1159 Encounter for screening for other viral diseases: Secondary | ICD-10-CM

## 2021-12-08 DIAGNOSIS — E1159 Type 2 diabetes mellitus with other circulatory complications: Secondary | ICD-10-CM

## 2021-12-08 DIAGNOSIS — E1122 Type 2 diabetes mellitus with diabetic chronic kidney disease: Secondary | ICD-10-CM | POA: Insufficient documentation

## 2021-12-08 DIAGNOSIS — Z79899 Other long term (current) drug therapy: Secondary | ICD-10-CM | POA: Insufficient documentation

## 2021-12-08 DIAGNOSIS — S88112A Complete traumatic amputation at level between knee and ankle, left lower leg, initial encounter: Secondary | ICD-10-CM

## 2021-12-08 LAB — GLUCOSE, POCT (MANUAL RESULT ENTRY): POC Glucose: 124 mg/dl — AB (ref 70–99)

## 2021-12-08 MED ORDER — GABAPENTIN 300 MG PO CAPS: 300.0000 mg | ORAL_CAPSULE | Freq: Every day | ORAL | 1 refills | Status: DC

## 2021-12-08 NOTE — Progress Notes (Signed)
Medication refill

## 2021-12-08 NOTE — Progress Notes (Signed)
? ?Subjective:  ?Patient ID: Adam James, male    DOB: 02/29/1971  Age: 51 y.o. MRN: [id number] ? ?CC: Hospitalization Follow-up ? ? ?HPI ?Adam James is a 51 y.o. year old male with a history of HFrEF (EF 20-25% from echo 10/2021), CABG (in 04/2022), SVT, type 2 diabetes mellitus (A1c 10.1), stage III CKD, left BKA (in 07/2021), depression. ?He was hospitalized last month for acute CHF exacerbation and had been out of his diuretic.  He was treated with IV diuretics and resumed his medications.  Labs had revealed acute on chronic injury with creatinine trending up to 2.1 from a baseline of 1.6. ?Echocardiogram revealed: ?IMPRESSIONS  ? ? ? 1. Left ventricular ejection fraction, by estimation, is 20 to 25%. The  ?left ventricle has severely decreased function. The left ventricle  ?demonstrates global hypokinesis. Left ventricular diastolic function could  ?not be evaluated.  ? 2. Right ventricular systolic function is moderately reduced. The right  ?ventricular size is normal. There is moderately elevated pulmonary artery  ?systolic pressure. The estimated right ventricular systolic pressure is  ?[id number] mmHg.  ? 3. Left atrial size was mildly dilated.  ? 4. The mitral valve is normal in structure. Mild to moderate mitral valve  ?regurgitation. No evidence of mitral stenosis.  ? 5. The aortic valve is normal in structure. Aortic valve regurgitation is  ?trivial. No aortic stenosis is present.  ? 6. The inferior vena cava is normal in size with greater than 50%  ?respiratory variability, suggesting right atrial pressure of 3 mmHg.  ? ? ?Interval History: ?Today he reports doing well. ?He has no dyspnea, pedal edema, weight gain or chest pain. ?He sees Cardiology later this month.   ? 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  Currently in the process of being set up for fitting for prosthesis.  He needs to call for an appointment with Dr Sharol Given ? ?Blood sugars have been 150-170. When his sugars get as low as 80  he states he experiences hypoglycemic symptoms. ?He takes Glipizide qd rather than bid so he does not run out. He also never picked up Jardiance from the Pharmacy. ?He has numbness in his left foot and is currently not on anything for this.  Denies hypoglycemic symptoms. ?Last eye exam was last year when he did it for Disability ?Past Medical History:  ?Diagnosis Date  ? Anemia   ? GI bleed; 03/13/11 EGD: relux esophagitis with ulceration/clot  ? CAD (coronary artery disease)   ? Cardiomyopathy ([place])   ? CHF (congestive heart failure) ([place])   ? CKD (chronic kidney disease)   ? Diabetic foot ulcer ([place])   ? Dyslipidemia   ? Dyspnea   ? Heart failure ([place])   ? Hypertension   ? Myocardial infarction [facility])   ? Type 2 diabetes mellitus ([place])   ? ? ?Past Surgical History:  ?Procedure Laterality Date  ? AMPUTATION Left 04/30/2021  ? Procedure: LEFT FOOT 5TH RAY AMPUTATION APPLICATION OF A-CELL POWDER;  Surgeon: Newt Minion, MD;  Location: [place];  Service: Orthopedics;  Laterality: Left;  ? AMPUTATION Left 07/30/2021  ? Procedure: LEFT BELOW KNEE AMPUTATION;  Surgeon: Newt Minion, MD;  Location: [place];  Service: Orthopedics;  Laterality: Left;  ? APPENDECTOMY    ? APPLICATION OF WOUND VAC Left 03/03/2021  ? Procedure: APPLICATION OF WOUND VAC;  Surgeon: Leandrew Koyanagi, MD;  Location: [place];  Service: Orthopedics;  Laterality: Left;  ? APPLICATION OF WOUND VAC  03/07/2021  ?  Procedure: APPLICATION OF WOUND VAC;  Surgeon: Newt Minion, MD;  Location: [place];  Service: Orthopedics;;  ? APPLICATION OF WOUND VAC  07/30/2021  ? Procedure: APPLICATION OF WOUND VAC;  Surgeon: Newt Minion, MD;  Location: [place];  Service: Orthopedics;;  ? CARDIAC CATHETERIZATION    ? COLON SURGERY    ? CORONARY ARTERY BYPASS GRAFT N/A 05/22/2021  ? Procedure: CORONARY ARTERY BYPASS GRAFTING (CABG) X 3 USING LEFT INTERNAL MAMMARY ARTERY AND RIGHT ENDOSCOPIC GREATER SAPHENOUS VEIN CONDUITS;  Surgeon: Lajuana Matte, MD;  Location: [place];   Service: Open Heart Surgery;  Laterality: N/A;  ? ENDOVEIN HARVEST OF GREATER SAPHENOUS VEIN Right 05/22/2021  ? Procedure: ENDOVEIN HARVEST OF GREATER SAPHENOUS VEIN;  Surgeon: Lajuana Matte, MD;  Location: [place];  Service: Open Heart Surgery;  Laterality: Right;  ? ESOPHAGOGASTRODUODENOSCOPY (EGD) WITH PROPOFOL N/A 03/12/2021  ? Procedure: ESOPHAGOGASTRODUODENOSCOPY (EGD) WITH PROPOFOL;  Surgeon: Daryel November, MD;  Location: [place];  Service: Gastroenterology;  Laterality: N/A;  ? I & D EXTREMITY Left 02/24/2021  ? Procedure: IRRIGATION AND DEBRIDEMENT EXTREMITY;  Surgeon: Altamese Bell, MD;  Location: [place];  Service: Orthopedics;  Laterality: Left;  ? I & D EXTREMITY Left 02/26/2021  ? Procedure: IRRIGATION AND DEBRIDEMENT OF LEG;  Surgeon: Newt Minion, MD;  Location: [place];  Service: Orthopedics;  Laterality: Left;  ? I & D EXTREMITY Left 03/03/2021  ? Procedure: LEFT LEG IRRIGATION AND DEBRIDEMENT;  Surgeon: Leandrew Koyanagi, MD;  Location: [place];  Service: Orthopedics;  Laterality: Left;  ? I & D EXTREMITY Left 03/07/2021  ? Procedure: REPEAT DEBRIDEMENT LEFT LEG;  Surgeon: Newt Minion, MD;  Location: [place];  Service: Orthopedics;  Laterality: Left;  ? IR FLUORO GUIDE CV LINE RIGHT  03/10/2021  ? IR REMOVAL TUN CV CATH W/O FL  03/24/2021  ? IR US GUIDE VASC ACCESS RIGHT  03/10/2021  ? LEFT HEART CATH AND CORONARY ANGIOGRAPHY N/A 09/13/2020  ? Procedure: LEFT HEART CATH AND CORONARY ANGIOGRAPHY;  Surgeon: Troy Sine, MD;  Location: [place] [facility];  Service: Cardiovascular;  Laterality: N/A;  ? LEFT HEART CATH AND CORONARY ANGIOGRAPHY N/A 03/28/2021  ? Procedure: LEFT HEART CATH AND CORONARY ANGIOGRAPHY;  Surgeon: Larey Dresser, MD;  Location: [place] [facility];  Service: Cardiovascular;  Laterality: N/A;  ? RIGHT HEART CATH N/A 03/26/2021  ? Procedure: RIGHT HEART CATH;  Surgeon: Larey Dresser, MD;  Location: [place] [facility];  Service: Cardiovascular;  Laterality: N/A;  ? TEE  WITHOUT CARDIOVERSION N/A 05/22/2021  ? Procedure: TRANSESOPHAGEAL ECHOCARDIOGRAM (TEE);  Surgeon: Lajuana Matte, MD;  Location: [place];  Service: Open Heart Surgery;  Laterality: N/A;  ? TOE AMPUTATION  2020  ? ? ?Family History  ?Problem Relation Age of Onset  ? Hypertension Mother   ? Heart failure Father   ? Hypertension Brother   ? Congestive Heart Failure Brother   ? ? ?Social History  ? ?Socioeconomic History  ? Marital status: Single  ?  Spouse name: Not on file  ? Number of children: 1  ? Years of education: Not on file  ? Highest education level: 8th grade  ?Occupational History  ? Occupation: none  ?  Comment: former "[employer]"  ?Tobacco Use  ? Smoking status: Former  ?  Packs/day: 3.00  ?  Years: 36.00  ?  Pack years: 108.00  ?  Types: Cigarettes  ?  Quit date: 08/27/2020  ?  Years since quitting: 1.2  ? Smokeless tobacco: Never  ? Tobacco comments:  ?  5-6 daily  ?Vaping Use  ? Vaping Use: Never used  ?Substance and Sexual Activity  ? Alcohol use: Not Currently  ?  Comment: Very rare  ? Drug use: Not Currently  ?  Types: Marijuana  ? Sexual activity: Not on file  ?Other Topics Concern  ? Not on file  ?Social History Narrative  ? Not on file  ? ?Social Determinants of Health  ? ?Financial Resource Strain: High Risk  ? Difficulty of Paying Living Expenses: Hard  ?Food Insecurity: No Food Insecurity  ? Worried About Charity fundraiser in the Last Year: Never true  ? Ran Out of Food in the Last Year: Never true  ?Transportation Needs: No Transportation Needs  ? Lack of Transportation (Medical): No  ? Lack of Transportation (Non-Medical): No  ?Physical Activity: Not on file  ?Stress: Not on file  ?Social Connections: Not on file  ? ? ?Allergies  ?Allergen Reactions  ? Morphine Itching  ? ? ?Outpatient Medications Prior to Visit  ?Medication Sig Dispense Refill  ? albuterol (VENTOLIN HFA) 108 (90 Base) MCG/ACT inhaler Inhale 2 puffs into the lungs every 4 (four) hours as needed for shortness of breath or  wheezing.    ? amiodarone (PACERONE) 200 MG tablet Take 1 tablet (200 mg total) by mouth daily. 30 tablet 3  ? aspirin 81 MG EC tablet Take 1 tablet (81 mg total) by mouth daily. 30 tablet 11  ? atorvastatin (LIPIT

## 2021-12-08 NOTE — Patient Instructions (Signed)
Phantom Limb Pain Phantom limb pain is pain in a body part that no longer exists. It usually happens in an arm or leg after it has been surgically removed (amputated). Most cases of phantom limb pain are brief. However, it can last for years, and it may be severe and disabling. The exact mechanism of how phantom limb pain occurs is not known. The problem may start in a part of the brain that processes feelings and awareness (sensations) from the rest of the body (sensory cortex). When a body part is lost, the sensory cortex may not be able to handle the loss and may reorganize (rewire) itself to make up for the lost signals. What are the causes? This condition only happens in patients with amputations, but the cause is not known. It may be caused by: Damaged nerve endings. Scar tissue. Rewiring of nerves in the brain or spine. What are the signs or symptoms? Symptoms vary and are related to the lost limb or the remaining stump. Stump pain may be mistaken for phantom limb pain, or you may have both at the same time. Symptoms include: Phantom pain. Pain often feels like the pain you had before the amputation. It usually comes and goes, and it gets better over time. The pain may feel like: Burning. Stabbing. Throbbing. Cramping. Prickling. Crushing. Telescoping. This is when the pain moves over time from the farthest part of the amputated limb (fingers or toes) up to the site of amputation, as if the limb is shrinking. Phantom sensation. This is a feeling other than pain, as if the limb is still part of the body. Physical or emotional factors can trigger or worsen pain sensations. Those factors may include: Weather changes. Stress. Strong emotions. Certain positions or movements of the body. Pressure on the affected area. How is this diagnosed? Diagnosis is based on your history of amputation and symptoms that you have after surgery. Your health care provider may: Do a physical exam. Talk  with you about your symptoms and past history of pain. You may have imaging tests to examine your stump, such as X-rays or CT scan. How is this treated? There are different therapies and medicines that may give you relief. Treatment options may include: Pain medicine. Medicine can be given for pain right after surgery (acute pain) and for pain that goes on for some time (chronic pain). Commonly used medicines include: Antidepressant medicine. Anticonvulsant medicine. Narcotics, analgesics, or anti-inflammatory medicine. Nerve blocks. Techniques that help to retrain the brain and nervous system (movement representation techniques), such as: Looking at your unaffected limb in a mirror and thinking about painless movement of your extremity (mirror therapy). Thinking about moving your limbs without actual movement (motor therapy). Watching and sensing the movement of other people (action observation). Attaching a myoelectric sensor on the stump to detect the muscle potential and predict the motion the person wants to perform (virtual reality). Sensory discrimination training. For this treatment, painless stimulation is applied to different parts of your stump and you describe what you feel. This may help with nerve rewiring. Physical therapy involving the stump, which may include: Exercise. This may be physical movement, or it may involve applying sound waves (ultrasound) or tapping (percussion therapy) to the stump. These exercises may help to heal and retrain tissue and nerves. Massage. Stump massage creates new sensations, breaks up scar tissue, and prepares the stump for an artificial limb (prosthesis). Heat or cold treatment. This can improve blood flow and reduce inflammation. Applying painless electrical   pulses to the skin to prevent sensations of pain from reaching the brain (transcutaneous electrical nerve stimulation, TENS). Complementary therapies, such as: Acupuncture. Relaxation  techniques. These often involve hypnosis, guided imagery, deep breathing, and muscle relaxation exercises. Biofeedback. This involves using monitors that alert you to changes in your breathing, heart rate, skin temperature, or muscle activity, and using relaxation techniques to reverse those changes. Biofeedback tells you if the techniques you are using are effective. Follow these instructions at home:  Take over-the-counter and prescription medicines only as told by your health care provider. Ask your health care provider if the medicine prescribed to you requires you to avoid driving or using machinery. Do not use any products that contain nicotine or tobacco. These products include cigarettes, chewing tobacco, and vaping devices, such as e-cigarettes. If you need help quitting, ask your health care provider. Join a support group. Express your feelings and talk with someone you trust. Seek counseling or talk therapy with a mental health professional. This may be helpful if you are having trouble managing your emotions about the amputation. Keep all follow-up visits. This is important. Contact a health care provider if: You have a sore on your stump that does not get better with treatment. Your pain does not improve with medicine or treatment. Get help right away if: You have suicidal thoughts. If you ever feel like you may hurt yourself or others, or have thoughts about taking your own life, get help right away. Go to your nearest emergency department or: Call your local emergency services (911 in the [place] Call a suicide crisis helpline, such as the National Suicide Prevention Lifeline at 1-800-273-8255 or 988 in the [place] This is open 24 hours a day in the [place] Text the Crisis Text Line at 741741 (in the [place] Summary Phantom limb pain is pain in a body part that no longer exists. It happens in an arm or leg (extremity) after it has been surgically removed (amputated). Medicines or  techniques that help to retrain the brain and nervous system (movement representation techniques) may help to relieve symptoms. Physical therapy for phantom limb pain may involve exercise, massage, heat or cold therapy, or painless stimulation of the skin (transcutaneous electrical nerve stimulation, TENS). This information is not intended to replace advice given to you by your health care provider. Make sure you discuss any questions you have with your health care provider. Document Revised: 02/05/2021 Document Reviewed: 11/28/2020 Elsevier Patient Education  2023 [employer]  

## 2021-12-09 LAB — CMP14+EGFR
ALT: 17 IU/L (ref 0–44)
AST: 13 IU/L (ref 0–40)
Albumin/Globulin Ratio: 1.4 (ref 1.2–2.2)
Albumin: 3.9 g/dL — ABNORMAL LOW (ref 4.0–5.0)
Alkaline Phosphatase: 91 IU/L (ref 44–121)
BUN/Creatinine Ratio: 11 (ref 9–20)
BUN: 17 mg/dL (ref 6–24)
Bilirubin Total: 0.2 mg/dL (ref 0.0–1.2)
CO2: 23 mmol/L (ref 20–29)
Calcium: 9.1 mg/dL (ref 8.7–10.2)
Chloride: 102 mmol/L (ref 96–106)
Creatinine, Ser: 1.6 mg/dL — ABNORMAL HIGH (ref 0.76–1.27)
Globulin, Total: 2.8 g/dL (ref 1.5–4.5)
Glucose: 115 mg/dL — ABNORMAL HIGH (ref 70–99)
Potassium: 4.7 mmol/L (ref 3.5–5.2)
Sodium: 139 mmol/L (ref 134–144)
Total Protein: 6.7 g/dL (ref 6.0–8.5)
eGFR: 52 mL/min/{1.73_m2} — ABNORMAL LOW (ref >59)

## 2021-12-09 LAB — CBC WITH DIFFERENTIAL/PLATELET
Basophils Absolute: 0.1 10*3/uL (ref 0.0–0.2)
Basos: 1 %
EOS (ABSOLUTE): 0.2 10*3/uL (ref 0.0–0.4)
Eos: 2 %
Hematocrit: 38 % (ref 37.5–51.0)
Hemoglobin: 13.2 g/dL (ref 13.0–17.7)
Immature Grans (Abs): 0 10*3/uL (ref 0.0–0.1)
Immature Granulocytes: 0 %
Lymphocytes Absolute: 3.6 10*3/uL — ABNORMAL HIGH (ref 0.7–3.1)
Lymphs: 37 %
MCH: 28.6 pg (ref 26.6–33.0)
MCHC: 34.7 g/dL (ref 31.5–35.7)
MCV: 82 fL (ref 79–97)
Monocytes Absolute: 0.5 10*3/uL (ref 0.1–0.9)
Monocytes: 6 %
Neutrophils Absolute: 5.3 10*3/uL (ref 1.4–7.0)
Neutrophils: 54 %
Platelets: 261 10*3/uL (ref 150–450)
RBC: 4.62 x10E6/uL (ref 4.14–5.80)
RDW: 14.2 % (ref 11.6–15.4)
WBC: 9.8 10*3/uL (ref 3.4–10.8)

## 2021-12-09 LAB — MAGNESIUM: Magnesium: 2.1 mg/dL (ref 1.6–2.3)

## 2021-12-09 LAB — HCV INTERPRETATION

## 2021-12-09 LAB — HCV AB W REFLEX TO QUANT PCR: HCV Ab: NONREACTIVE

## 2021-12-11 ENCOUNTER — Telehealth: Payer: Self-pay | Admitting: Orthopedic Surgery

## 2021-12-11 NOTE — Telephone Encounter (Signed)
Pt called and states that he missed his fitting for his prothesis and would like to know if we can fax a new script?  FAX [phone]

## 2021-12-12 NOTE — Telephone Encounter (Signed)
Our office called him on 11/26/21 to schedule him another f/u due to him not being seen since February and he missed his prosthetic fitting. We need to have him come into office for a new script since he missed fitting. Can you call him back to schedule an appt? Thank you

## 2021-12-17 ENCOUNTER — Ambulatory Visit: Payer: Medicaid Other | Admitting: Family

## 2021-12-17 ENCOUNTER — Encounter: Payer: Self-pay | Admitting: Family

## 2021-12-17 ENCOUNTER — Ambulatory Visit (INDEPENDENT_AMBULATORY_CARE_PROVIDER_SITE_OTHER): Payer: Self-pay | Admitting: Family

## 2021-12-17 DIAGNOSIS — Z89512 Acquired absence of left leg below knee: Secondary | ICD-10-CM

## 2021-12-17 NOTE — Progress Notes (Signed)
Office Visit Note   Patient: Adam James           Date of Birth: 04/28/1971           MRN: [id number] Visit Date: 12/17/2021              Requested by: Hoy RegisterNewlin, Enobong, MD [address] [place] [id number] PCP: Hoy RegisterNewlin, Enobong, MD  Chief Complaint  Patient presents with   Left Leg - Follow-up    08/05/21 left BKA prosthetic eval       HPI: The patient is a 51 year old gentleman who is status post left below-knee amputation in January of this year unfortunately he has not yet been set up with his prosthesis he presents today requesting an order for 1 he is eager to get set up with a prosthetic so he can resume ambulation and activities of daily living.  Patient is a new left transtibial  amputee.  Patient's current comorbidities are not expected to impact the ability to function with the prescribed prosthesis. Patient verbally communicates a strong desire to use a prosthesis. Patient currently requires mobility aids to ambulate without a prosthesis.  Expects not to use mobility aids with a new prosthesis.  Patient is a K2 level ambulator that will use a prosthesis to walk around their home and the community over low level environmental barriers.      Assessment & Plan: Visit Diagnoses: No diagnosis found.  Plan: Given an order for his prosthesis set up.  We will fax this to [facility] at his request.  Follow-Up Instructions: No follow-ups on file.   Ortho Exam  Patient is alert, oriented, no adenopathy, well-dressed, normal affect, normal respiratory effort. On examination of the left residual limb this is well consolidated well-healed there is no gaping no erythema no cyst callus or skin breakdown  Imaging: No results found. No images are attached to the encounter.  Labs: Lab Results  Component Value Date   HGBA1C 10.1 (H) 11/05/2021   HGBA1C 7.9 (H) 07/30/2021   HGBA1C 6.5 (H) 05/20/2021   ESRSEDRATE 25 (H) 03/14/2021   ESRSEDRATE >140  (H) 02/25/2021   CRP 1.6 (H) 03/14/2021   REPTSTATUS 05/06/2021 FINAL 04/30/2021   GRAMSTAIN NO WBC SEEN NO ORGANISMS SEEN  04/30/2021   CULT  04/30/2021    RARE PROVIDENCIA STUARTII RARE ALCALIGENES FAECALIS RARE NORMAL SKIN FLORA CRITICAL RESULT CALLED TO, READ BACK BY AND VERIFIED WITH: RN MARY S. [id number] [phone] FCP NO ANAEROBES ISOLATED Performed at [facility], [address] [place], [place] [id number]    LABORGA PROVIDENCIA STUARTII 04/30/2021   LABORGA ALCALIGENES FAECALIS 04/30/2021     Lab Results  Component Value Date   ALBUMIN 3.9 (L) 12/08/2021   ALBUMIN 2.2 (L) 11/07/2021   ALBUMIN 2.1 (L) 11/06/2021    Lab Results  Component Value Date   MG 2.1 12/08/2021   MG 2.2 11/07/2021   MG 1.6 (L) 11/06/2021   No results found for: VD25OH  No results found for: PREALBUMIN    Latest Ref Rng & Units 12/08/2021    3:09 PM 11/06/2021    1:20 AM 11/05/2021    8:40 AM  CBC EXTENDED  WBC 3.4 - 10.8 x10E3/uL 9.8   8.6   6.5    RBC 4.14 - 5.80 x10E6/uL 4.62   3.53   3.72    Hemoglobin 13.0 - 17.7 g/dL 13.013.2   9.9   86.510.5    HCT 37.5 - 51.0 %  38.0   30.5   32.6    Platelets 150 - 450 x10E3/uL 261   246   201    NEUT# 1.4 - 7.0 x10E3/uL 5.3   4.7     Lymph# 0.7 - 3.1 x10E3/uL 3.6   3.0        There is no height or weight on file to calculate BMI.  Orders:  No orders of the defined types were placed in this encounter.  No orders of the defined types were placed in this encounter.    Procedures: No procedures performed  Clinical Data: No additional findings.  ROS:  All other systems negative, except as noted in the HPI. Review of Systems  Constitutional:  Negative for chills and fever.  Musculoskeletal:  Negative for arthralgias and myalgias.  Skin:  Negative for color change and wound.  Neurological:  Negative for weakness.   Objective: Vital Signs: There were no vitals taken for this visit.  Specialty Comments:  No specialty comments  available.  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   Acute osteomyelitis of left foot (HCC)    Acute exacerbation of CHF (congestive heart failure) (HCC) 04/28/2021   Venous stasis ulcer of right calf limited to breakdown of skin without varicose veins (HCC)    Non-pressure chronic ulcer of other part of left foot limited to breakdown of skin (HCC)    Subacute osteomyelitis, left ankle and foot (HCC)    Acute on chronic combined systolic and diastolic CHF (congestive heart failure) (HCC)    Acute on chronic heart failure (HCC) 03/23/2021   NSTEMI (non-ST elevated myocardial infarction) (HCC)    Gastroesophageal reflux disease with esophagitis and hemorrhage    Anemia, posthemorrhagic, acute    Acute esophagitis    Gastrointestinal hemorrhage    Necrotizing fasciitis (HCC)    Left leg cellulitis    Diabetic polyneuropathy associated with type 2 diabetes mellitus (HCC)    Severe protein-calorie malnutrition (HCC)    Gangrene of left foot (HCC) 02/25/2021   Type 2 diabetes mellitus (HCC)    Heart failure (HCC)    Dyslipidemia    Diabetic foot ulcer (HCC)    CAD (coronary artery disease)    Cardiomyopathy (HCC)    Coronary artery disease involving native coronary artery of native heart with angina pectoris (HCC)    Past Medical History:  Diagnosis Date   Anemia    GI bleed; 03/13/11 EGD: relux esophagitis with ulceration/clot   CAD (coronary artery disease)    Cardiomyopathy (HCC)    CHF (congestive heart failure) (HCC)    CKD (chronic kidney disease)    Diabetic foot ulcer (HCC)    Dyslipidemia    Dyspnea    Heart failure (HCC)    Hypertension    Myocardial infarction (HCC)     Type 2 diabetes mellitus (HCC)     Family History  Problem Relation Age of Onset   Hypertension Mother    Heart failure Father    Hypertension Brother    Congestive Heart Failure Brother     Past Surgical History:  Procedure Laterality Date   AMPUTATION Left 04/30/2021   Procedure: LEFT FOOT 5TH RAY AMPUTATION APPLICATION OF A-CELL POWDER;  Surgeon: [name], MD;  Location: [facility] OR;  Service: Orthopedics;  Laterality: Left;   AMPUTATION Left 07/30/2021   Procedure: LEFT BELOW KNEE AMPUTATION;  Surgeon: [name], MD;  [facility] OR;  Service: Orthopedics;  Laterality: Left;   APPENDECTOMY     APPLICATION OF WOUND VAC Left 03/03/2021   Procedure: APPLICATION OF WOUND VAC;  Surgeon: Tarry Kos, MD;  Location: [facility] OR;  Service: Orthopedics;  Laterality: Left;   APPLICATION OF WOUND VAC  03/07/2021   Procedure: APPLICATION OF WOUND VAC;  Surgeon: [name], MD;  Location: [facility] OR;  Service: Orthopedics;;   APPLICATION OF WOUND VAC  07/30/2021   Procedure: APPLICATION OF WOUND VAC;  Surgeon: [name], MD;  Location: [facility] OR;  Service: Orthopedics;;   CARDIAC CATHETERIZATION     COLON SURGERY     CORONARY ARTERY BYPASS GRAFT N/A 05/22/2021   Procedure: CORONARY ARTERY BYPASS GRAFTING (CABG) X 3 USING LEFT INTERNAL MAMMARY ARTERY AND RIGHT ENDOSCOPIC GREATER SAPHENOUS VEIN CONDUITS;  Surgeon: Corliss Skains, MD;  Location: [facility] OR;  Service: Open Heart Surgery;  Laterality: N/A;   ENDOVEIN HARVEST OF GREATER SAPHENOUS VEIN Right 05/22/2021   Procedure: ENDOVEIN HARVEST OF GREATER SAPHENOUS VEIN;  Surgeon: Corliss Skains, MD;  Location: [facility] OR;  Service: Open Heart Surgery;  Laterality: Right;   ESOPHAGOGASTRODUODENOSCOPY (EGD) WITH PROPOFOL N/A 03/12/2021   Procedure: ESOPHAGOGASTRODUODENOSCOPY (EGD) WITH PROPOFOL;  Surgeon: Jenel Lucks, MD;  Location: [facility] ENDOSCOPY;  Service: Gastroenterology;  Laterality: N/A;   I & D EXTREMITY Left 02/24/2021   Procedure:  IRRIGATION AND DEBRIDEMENT EXTREMITY;  Surgeon: Myrene Galas, MD;  Location: [facility] OR;  Service: Orthopedics;  Laterality: Left;   I & D EXTREMITY Left 02/26/2021   Procedure: IRRIGATION AND DEBRIDEMENT OF LEG;  Surgeon: [name], MD;  Location: [facility] OR;  Service: Orthopedics;  Laterality: Left;   I & D EXTREMITY Left 03/03/2021   Procedure: LEFT LEG IRRIGATION AND DEBRIDEMENT;  Surgeon: Tarry Kos, MD;  Location: [facility] OR;  Service: Orthopedics;  Laterality: Left;   I & D EXTREMITY Left 03/07/2021   Procedure: REPEAT DEBRIDEMENT LEFT LEG;  Surgeon: [name], MD;  Location: [facility] OR;  Service: Orthopedics;  Laterality: Left;   IR FLUORO GUIDE CV LINE RIGHT  03/10/2021   IR REMOVAL TUN CV CATH W/O FL  03/24/2021   IR US GUIDE VASC ACCESS RIGHT  03/10/2021   LEFT HEART CATH AND CORONARY ANGIOGRAPHY N/A 09/13/2020   Procedure: LEFT HEART CATH AND CORONARY ANGIOGRAPHY;  Surgeon: Lennette Bihari, MD;  Location: [facility] INVASIVE CV LAB;  Service: Cardiovascular;  Laterality: N/A;   LEFT HEART CATH AND CORONARY ANGIOGRAPHY N/A 03/28/2021   Procedure: LEFT HEART CATH AND CORONARY ANGIOGRAPHY;  Surgeon: Laurey Morale, MD;  Location: [facility] INVASIVE CV LAB;  Service: Cardiovascular;  Laterality: N/A;   RIGHT HEART CATH N/A 03/26/2021   Procedure: RIGHT HEART CATH;  Surgeon: Laurey Morale, MD;  [facility] INVASIVE CV LAB;  Service: Cardiovascular;  Laterality: N/A;   TEE WITHOUT CARDIOVERSION N/A 05/22/2021   Procedure: TRANSESOPHAGEAL ECHOCARDIOGRAM (TEE);  Surgeon: Corliss Skains, MD;  Location: [facility] OR;  Service: Open Heart Surgery;  Laterality: N/A;   TOE AMPUTATION  2020   Social History   Occupational History   Occupation: none    Comment: former "[employer]"  Tobacco Use   Smoking status: Former    Packs/day: 3.00    Years: 36.00  Pack years: 108.00    Types: Cigarettes    Quit date: 08/27/2020    Years since quitting: 1.3   Smokeless tobacco: Never   Tobacco comments:    5-6 daily  Vaping  Use   Vaping Use: Never used  Substance and Sexual Activity   Alcohol use: Not Currently    Comment: Very rare   Drug use: Not Currently    Types: Marijuana   Sexual activity: Not on file

## 2021-12-18 ENCOUNTER — Encounter (HOSPITAL_COMMUNITY): Payer: Medicaid Other

## 2021-12-23 ENCOUNTER — Telehealth (HOSPITAL_COMMUNITY): Payer: Self-pay

## 2021-12-23 NOTE — Telephone Encounter (Signed)
Called to confirm/remind patient of their appointment at the [facility] on 12/24/21.   Patient reminded to bring all medications and/or complete list.  Confirmed patient has transportation. Gave directions, instructed to utilize valet parking.  Confirmed appointment prior to ending call.   

## 2021-12-23 NOTE — Progress Notes (Signed)
[facility] Note    PCP: Hoy Register, MD PCP-Cardiologist: Norman Herrlich, MD  HF Cardiologist: Dr. Shirlee Latch  HPI: [name] is a 51 y.o.with a history CAD, Had PCI 2007 in [place]. 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   Admitted to [name] in February 2022 with lower extremity edema and foot ulcer. BNP and HS Trop elevated and felt to be non-ACS.  Cardiology consulted and he was placed on lasix.    He had follow up with Dr Dulce Sellar 09/09/20 and was set up for cath. Cath with multivessel CAD. Saw Dr Cliffton Asters in the office with plans for CMRI. He was referred to SW due to financial concerns and no payor source. CMRI showed EF 40% and viable myocardium.     Saw Dr Cliffton Asters in July 2022 and was set up for CABG in August. Pre CABG labs completed and showed WBC 25. He was advised to go to the ED.    Admitted 02/25/2021 with left foot infection complicated by osteomyelitis and necrotizing fasciitis. 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 Hospital course complicated by erosive esophagitis on EGD. Recommendations for protonix 40 mg bid + repeat EGD in 8 weeks. Received multiple transfusions. Discharged 03/15/21.    Admitted 03/23/21 with A/C CHF exacerbation. Started on IV lasix. Cardiology consulted. CO-OX 55%. Given 80 mg IV x1. V lasix increased to 100 mg tid and started on metolazone.  Repeat echo on 03/23/21 with LVEF of 25%, LV demonstrating global hypokinesis, and LV diastolic parameters consistent with grade II diastolic dysfunction. RHC performed 03/26/21 showed preserved cardiac output, mildly elevated PCWP, and mild pulmonary venous hypertension. LHC performed on 03/28/21 showed  minimal change from prior study in 2/22. Ost LAD to Mid LAD 90% stenosed, mid LAD to Dist LAD 90% stenosed, dist LAD lesion 100%  stenosed.prox Cx 70% stenosed, prox RCA to Mid RCA 100% stenosed, dist RCA 100% stenosed, ramus 70% stenosed, 1st Mrg 50% stenosed, mid LM to Dist LM 60% stenosed. Hospitalization c/b AKI on CKD 3.   After full treatment of foot infection, he was admitted in 10/22 for CABG with LIMA-LAD, SVG-OM1, SVG-OM3.  PDA too small to graft.   Follow up 12/22, he was hypovolemic and spiro and torsemide stopped. He was to be seen for f/u in 3 weeks but did not show up for the appointment.    Underwent L BKA 1/23 d/t osteomyelitis and abscess.  Admitted 4/23 with a/c CHF. Diuresed with IV lasix. Echo showed EF 20-25%, RV moderately reduced, mild to moderate MR. GDMT restarted. Discharged home, weight 209 lbs. Refused paramedicine and PT/OT.  Today he returns for HF follow up with his daughter. Overall feeling fine. Planning to get fitted for prosthetic tomorrow. Denies palpitations, CP, dizziness, edema, or PND/Orthopnea. Not very active physically. Appetite ok. No fever or chills. He does not weigh at home.  He has been off most GDMT except Lasix x 1 week, says he ran out. Lives with daughter who helps with transportation.  ECG (personally reviewed): none ordered today.  Labs (11/22): K 4.2, creatinine 1.38 Labs (12/22): K 4.4, creatinine 1.94 Labs (4/23): K 4.2, creatinine 2.1 Labs (5/23): K 4.7, creatinine 1.6  PMH: 1. GI bleeding: Erosive esophagitis 8/22.  2. CKD stage 3 3. Type 2 diabetes 4. Hyperlipidemia 5. Left foot/leg diabetic infection with necrotizing fasciitis: 8/22, debridements were done and the left 5th metatarsal was amputated.  6. Prior smoker 7. H/o PVC, VT: On amiodarone.  8. CAD: LHC in 9/22 with severe 3 vessel disease.  - CABG (10/22) with LIMA-LAD, SVG-OM2, SVG-OM3.  9. Chronic systolic CHF: Ischemic cardiomyopathy.  - Echo (8/22): LVEF of 25%, LV demonstrating global hypokinesis, and LV diastolic parameters consistent with grade II diastolic dysfunction.  - Echo (4/23): EF  20-25%, severe LV dysfunction, RV moderately reduced, RVSP 46.8 mmHg, mild to moderate MR  Review of Systems: All systems reviewed and negative except as per HPI.   Current Outpatient Medications  Medication Sig Dispense Refill   albuterol (VENTOLIN HFA) 108 (90 Base) MCG/ACT inhaler Inhale 2 puffs into the lungs every 4 (four) hours as needed for shortness of breath or wheezing.     amiodarone (PACERONE) 200 MG tablet Take 1 tablet (200 mg total) by mouth daily. 30 tablet 3   aspirin 81 MG EC tablet Take 1 tablet (81 mg total) by mouth daily. 30 tablet 11   atorvastatin (LIPITOR) 80 MG tablet Take 1 tablet (80 mg total) by mouth daily. 30 tablet 3   furosemide (LASIX) 40 MG tablet Take 1 tablet (40 mg total) by mouth daily. 45 tablet 11   glipiZIDE (GLUCOTROL) 5 MG tablet Take 1 tablet (5 mg total) by mouth 2 (two) times daily before a meal. 60 tablet 2   glucose blood (TRUE METRIX BLOOD GLUCOSE TEST) test strip Use as instructed twice daily 100 each 12   digoxin (LANOXIN) 0.125 MG tablet Take 1 tablet (0.125 mg total) by mouth daily. (Patient not taking: Reported on 12/24/2021) 30 tablet 1   empagliflozin (JARDIANCE) 10 MG TABS tablet Take 1 tablet (10 mg total) by mouth daily. (Patient not taking: Reported on 12/24/2021) 30 tablet 11   eplerenone (INSPRA) 25 MG tablet Take 1 tablet (25 mg total) by mouth daily. (Patient not taking: Reported on 12/24/2021) 30 tablet 11   gabapentin (NEURONTIN) 300 MG capsule Take 1 capsule (300 mg total) by mouth at bedtime. (Patient not taking: Reported on 12/24/2021) 90 capsule 1   losartan (COZAAR) 25 MG tablet Take 1 tablet (25 mg total) by mouth daily. (Patient not taking: Reported on 12/08/2021) 30 tablet 11   TRUEplus Lancets 28G MISC Use as directed twice daily at 8 am and 10 pm. 100 each 11   venlafaxine XR (EFFEXOR-XR) 75 MG 24 hr capsule Take 1 capsule (75 mg total) by mouth daily with breakfast. (Patient not taking: Reported on 12/24/2021) 30 capsule 2    No current facility-administered medications for this encounter.   Allergies  Allergen Reactions   Morphine Itching   Social History   Socioeconomic History   Marital status: Single    Spouse name: Not on file   Number of children: 1   Years of education: Not on file   Highest education level: 8th grade  Occupational History   Occupation: none    Comment: former "[employer]"  Tobacco Use   Smoking status: Former    Packs/day: 3.00    Years: 36.00    Pack years: 108.00    Types: Cigarettes    Quit date: 08/27/2020    Years since quitting: 1.3   Smokeless tobacco: Never   Tobacco comments:    5-6 daily  Vaping Use   Vaping Use: Never used  Substance and Sexual Activity   Alcohol use: Not Currently    Comment: Very rare   Drug use: Not Currently    Types: Marijuana   Sexual activity: Not on  file  Other Topics Concern   Not on file  Social History Narrative   Not on file   Social Determinants of Health   Financial Resource Strain: High Risk   Difficulty of Paying Living Expenses: Hard  Food Insecurity: No Food Insecurity   Worried About Running Out of Food in the Last Year: Never true   Ran Out of Food in the Last Year: Never true  Transportation Needs: No Transportation Needs   Lack of Transportation (Medical): No   Lack of Transportation (Non-Medical): No  Physical Activity: Not on file  Stress: Not on file  Social Connections: Not on file  Intimate Partner Violence: Not on file   Family History  Problem Relation Age of Onset   Hypertension Mother    Heart failure Father    Hypertension Brother    Congestive Heart Failure Brother    BP 126/88   Pulse 95   Ht 6\' 3"  (1.905 m)   Wt 97.7 kg (215 lb 6.4 oz)   SpO2 97%   BMI 26.92 kg/m   Wt Readings from Last 3 Encounters:  12/24/21 97.7 kg (215 lb 6.4 oz)  11/19/21 89.7 kg (197 lb 12.8 oz)  11/08/21 95 kg (209 lb 6.4 oz)   PHYSICAL EXAM: General:  NAD. No resp difficulty, arrived in [facility],  chronically-ill appearing  HEENT: Normal Neck: Supple. No JVD. Carotids 2+ bilat; no bruits. No lymphadenopathy or thryomegaly appreciated. Cor: PMI nondisplaced. Regular rate & rhythm. No rubs, gallops or murmurs. Lungs: Clear Abdomen: Soft, nontender, nondistended. No hepatosplenomegaly. No bruits or masses. Good bowel sounds. Extremities: No cyanosis, clubbing, rash, edema; s/p L BKA Neuro: Alert & oriented x 3, cranial nerves grossly intact. Moves all 4 extremities w/o difficulty. Affect pleasant.  ASSESSMENT & PLAN: 1. Chronic systolic CHF: Ischemic cardiomyopathy.  Initial echo in 2/22 with EF 40-45%.  Echo in 7/22 with EF 30-35%.  Echo 8/22 EF down to 25% with mild RV dysfunction and dilated IVC.  [place] 8/22 with preserved CO, mildly elevated PCWP, mild pulmonary hypertension. S/p CABG 10/22. Readmitted 4/23 with CHF, had been off diuretics and missed follow up. Echo this admit (4/23): EF 20-25%, RV moderately reduced, mild to moderate MR. Suspect some of the reason EF not improving is d/t noncompliance. NYHA II, functional class difficult due to BKA. He is not volume overloaded on exam. - Continue Lasix 40 mg daily. BMET today. - Restart Jardiance 10 mg daily. - Restart eplerenone 25 mg daily (refused spiro after reading it can block testosterone) - Restart digoxin 0.125 mg daily. Hold dig day of follow up appt to check trough. - Restart losartan 25 mg daily. (Entresto not on HF fund). - Add beta blocker next. 2. CAD: Hx PCI 2007. S/p CABG x 3 with LIMA-LAD, SVG-OM2, and SVG-OM3 10/22. No chest pain.  - Continue ASA 81 daily.  - Continue atorvastatin 80 mg daily. 3. PVCs/VT: Noted 10/22 post CABG.  - Continue amiodarone 200 mg daily. 4. CKD, 3a: Baseline 1.4-1.7 - BMET today. 5. GI bleeding: History of erosive esophagitis earlier in 8/22.  He is now on Protonix.   6. Left leg diabetic foot infection: Underwent left BKA 1/23. - Plans to get fitted for prosthesis soon. 7. DM2: A1c  10.1.  - PCP managing. 8. SDOH: Uninsured. Reports disability and medicaid were previously denied. HFSW assisting. - Lives in [place], [place] He refused paramedicine in the past, but is now agreeable. - Rx through HF fund. - Reports his  daughter is able to provide reliable transportation in the future  He is at high risk for re-admission with non compliance and limited understanding of severity of his heart failure.    Follow up with PharmD in 2 weeks (add back low dose Toprol and check dig trough) and 2 months with Dr. Aundra Dubin, as scheduled.  Adam Katz, FNP-BC 12/24/21

## 2021-12-23 NOTE — Telephone Encounter (Signed)
Called and spoke to patient's husband.to confirm/remind patient of their appointment at the [facility] on 12/24/21.

## 2021-12-24 ENCOUNTER — Ambulatory Visit (HOSPITAL_COMMUNITY)
Admission: RE | Admit: 2021-12-24 | Discharge: 2021-12-24 | Disposition: A | Discharge status: Home / Self Care | Payer: Medicaid Other | Source: Ambulatory Visit | Attending: Family Medicine | Admitting: Family Medicine

## 2021-12-24 ENCOUNTER — Telehealth (HOSPITAL_COMMUNITY): Payer: Self-pay | Admitting: Cardiology

## 2021-12-24 ENCOUNTER — Encounter (HOSPITAL_COMMUNITY): Payer: Self-pay

## 2021-12-24 ENCOUNTER — Telehealth: Payer: Self-pay

## 2021-12-24 VITALS — BP 126/88 | HR 95 | Ht 75.0 in | Wt 215.4 lb

## 2021-12-24 DIAGNOSIS — N183 Chronic kidney disease, stage 3 unspecified: Secondary | ICD-10-CM

## 2021-12-24 DIAGNOSIS — Z89619 Acquired absence of unspecified leg above knee: Secondary | ICD-10-CM

## 2021-12-24 DIAGNOSIS — L089 Local infection of the skin and subcutaneous tissue, unspecified: Secondary | ICD-10-CM | POA: Insufficient documentation

## 2021-12-24 DIAGNOSIS — N1831 Chronic kidney disease, stage 3a: Secondary | ICD-10-CM | POA: Insufficient documentation

## 2021-12-24 DIAGNOSIS — M868X7 Other osteomyelitis, ankle and foot: Secondary | ICD-10-CM | POA: Insufficient documentation

## 2021-12-24 DIAGNOSIS — K2210 Ulcer of esophagus without bleeding: Secondary | ICD-10-CM | POA: Insufficient documentation

## 2021-12-24 DIAGNOSIS — Z7984 Long term (current) use of oral hypoglycemic drugs: Secondary | ICD-10-CM | POA: Insufficient documentation

## 2021-12-24 DIAGNOSIS — Z7902 Long term (current) use of antithrombotics/antiplatelets: Secondary | ICD-10-CM | POA: Insufficient documentation

## 2021-12-24 DIAGNOSIS — E118 Type 2 diabetes mellitus with unspecified complications: Secondary | ICD-10-CM

## 2021-12-24 DIAGNOSIS — I255 Ischemic cardiomyopathy: Secondary | ICD-10-CM | POA: Insufficient documentation

## 2021-12-24 DIAGNOSIS — K922 Gastrointestinal hemorrhage, unspecified: Secondary | ICD-10-CM | POA: Insufficient documentation

## 2021-12-24 DIAGNOSIS — Z7982 Long term (current) use of aspirin: Secondary | ICD-10-CM | POA: Insufficient documentation

## 2021-12-24 DIAGNOSIS — I5022 Chronic systolic (congestive) heart failure: Secondary | ICD-10-CM

## 2021-12-24 DIAGNOSIS — Z597 Insufficient social insurance and welfare support: Secondary | ICD-10-CM | POA: Insufficient documentation

## 2021-12-24 DIAGNOSIS — E1122 Type 2 diabetes mellitus with diabetic chronic kidney disease: Secondary | ICD-10-CM | POA: Insufficient documentation

## 2021-12-24 DIAGNOSIS — I25118 Atherosclerotic heart disease of native coronary artery with other forms of angina pectoris: Secondary | ICD-10-CM

## 2021-12-24 DIAGNOSIS — Z7901 Long term (current) use of anticoagulants: Secondary | ICD-10-CM | POA: Insufficient documentation

## 2021-12-24 DIAGNOSIS — I130 Hypertensive heart and chronic kidney disease with heart failure and stage 1 through stage 4 chronic kidney disease, or unspecified chronic kidney disease: Secondary | ICD-10-CM | POA: Insufficient documentation

## 2021-12-24 DIAGNOSIS — Z89512 Acquired absence of left leg below knee: Secondary | ICD-10-CM | POA: Insufficient documentation

## 2021-12-24 DIAGNOSIS — Z8719 Personal history of other diseases of the digestive system: Secondary | ICD-10-CM

## 2021-12-24 DIAGNOSIS — Z79899 Other long term (current) drug therapy: Secondary | ICD-10-CM | POA: Insufficient documentation

## 2021-12-24 DIAGNOSIS — E785 Hyperlipidemia, unspecified: Secondary | ICD-10-CM | POA: Insufficient documentation

## 2021-12-24 DIAGNOSIS — Z139 Encounter for screening, unspecified: Secondary | ICD-10-CM

## 2021-12-24 DIAGNOSIS — I493 Ventricular premature depolarization: Secondary | ICD-10-CM

## 2021-12-24 DIAGNOSIS — Z951 Presence of aortocoronary bypass graft: Secondary | ICD-10-CM | POA: Insufficient documentation

## 2021-12-24 DIAGNOSIS — I2510 Atherosclerotic heart disease of native coronary artery without angina pectoris: Secondary | ICD-10-CM | POA: Insufficient documentation

## 2021-12-24 DIAGNOSIS — I272 Pulmonary hypertension, unspecified: Secondary | ICD-10-CM | POA: Insufficient documentation

## 2021-12-24 DIAGNOSIS — E11621 Type 2 diabetes mellitus with foot ulcer: Secondary | ICD-10-CM | POA: Insufficient documentation

## 2021-12-24 DIAGNOSIS — Z8679 Personal history of other diseases of the circulatory system: Secondary | ICD-10-CM | POA: Insufficient documentation

## 2021-12-24 LAB — BASIC METABOLIC PANEL
Anion gap: 5 (ref 5–15)
BUN: 34 mg/dL — ABNORMAL HIGH (ref 6–20)
CO2: 25 mmol/L (ref 22–32)
Calcium: 8.8 mg/dL — ABNORMAL LOW (ref 8.9–10.3)
Chloride: 106 mmol/L (ref 98–111)
Creatinine, Ser: 1.34 mg/dL — ABNORMAL HIGH (ref 0.61–1.24)
GFR, Estimated: >60 mL/min (ref >60)
Glucose, Bld: 227 mg/dL — ABNORMAL HIGH (ref 70–99)
Potassium: 5.3 mmol/L — ABNORMAL HIGH (ref 3.5–5.1)
Sodium: 136 mmol/L (ref 135–145)

## 2021-12-24 MED ORDER — LOSARTAN POTASSIUM 25 MG PO TABS: 25.0000 mg | ORAL_TABLET | Freq: Every day | ORAL | 2 refills | Status: DC

## 2021-12-24 MED ORDER — EPLERENONE 25 MG PO TABS: 25.0000 mg | ORAL_TABLET | Freq: Every day | ORAL | 6 refills | Status: DC

## 2021-12-24 MED ORDER — EMPAGLIFLOZIN 10 MG PO TABS: 10.0000 mg | ORAL_TABLET | Freq: Every day | ORAL | 6 refills | Status: DC

## 2021-12-24 MED ORDER — DIGOXIN 125 MCG PO TABS: 0.1250 mg | ORAL_TABLET | Freq: Every day | ORAL | 6 refills | Status: DC

## 2021-12-24 NOTE — Telephone Encounter (Signed)
Dr Margarita Rana- I received a message from Tammy Sours, LCSW/HF Clinic  inquiring if you would be able to prescribe his effexor going forward.

## 2021-12-24 NOTE — Telephone Encounter (Signed)
-----   Message from Jacklynn Ganong, [place] sent at 12/24/2021  3:34 PM EDT ----- K is elevated. Do not start losartan or eplerenone. OK to restart Jardiance and digoxin as discussed today.

## 2021-12-24 NOTE — Telephone Encounter (Signed)
Patient aware and voiced understanding

## 2021-12-24 NOTE — Patient Instructions (Addendum)
Thank you for coming in today  Labs were done today, if any labs are abnormal the clinic will call you No news is good news  RESTART: Losartan 25 mg daily  Jardiance 10 mg daily Digoxin 0.125 mg daily  Epelerone 25 mg daily   You have been referred to paramedicine    Do the following things EVERYDAY: Weigh yourself in the morning before breakfast. Write it down and keep it in a log. Take your medicines as prescribed Eat low salt foods--Limit salt (sodium) to 2000 mg per day.  Stay as active as you can everyday Limit all fluids for the day to less than 2 liters   Your physician recommends that you schedule a follow-up appointment in:  [place] DR. Aundra Dubin  At the [facility], you and your health needs are our priority. As part of our continuing mission to provide you with exceptional heart care, we have created designated Provider Care Teams. These Care Teams include your primary Cardiologist (physician) and Advanced Practice Providers (APPs- Physician Assistants and Nurse Practitioners) who all work together to provide you with the care you need, when you need it.   You may see any of the following providers on your designated Care Team at your next follow up: Dr Glori Bickers Dr Haynes Kerns, NP Lyda Jester, [place] [facility] [place], [place] Audry Riles, PharmD   Please be sure to bring in all your medications bottles to every appointment.   If you have any questions or concerns before your next appointment please send [place] a message through [place] or call our office at [phone].    TO LEAVE A MESSAGE FOR THE NURSE SELECT OPTION 2, PLEASE LEAVE A MESSAGE INCLUDING: YOUR NAME DATE OF BIRTH CALL BACK NUMBER REASON FOR CALL**this is important as we prioritize the call backs  YOU WILL RECEIVE A CALL BACK THE SAME DAY AS LONG AS YOU CALL BEFORE 4:00 PM

## 2021-12-24 NOTE — Telephone Encounter (Signed)
DOCUMENTED A ERROR

## 2021-12-24 NOTE — Progress Notes (Signed)
Heart and Vascular Care Navigation  12/24/2021  Adam James [birth year] [id number]  Reason for Referral: Paramedicine referral   Engaged with patient face to face for follow up visit for Heart and Vascular Care Coordination.                                                                                                   Assessment:    pt had been worked up for paramedicine last month but then refused services.  Pt now back for appt and has been without medications for a week- endorses that he would benefit from paramedic to help him manage his meds alongside his MDs- feels as if he ran out of meds due to poor communication from MD offices.  No new needs at this time.  Pt utilizes [place] [facility] assist for most medications- willing to consider other options including coming to [facility] if he can get medication assistance                                  HRT/VAS Care Coordination     Patients Home Cardiology Office [facility]   Outpatient Care Team Social Worker   Social Worker Name: Octavio Graves, LCSW, [facility]   Living arrangements for the past 2 months Apartment   Lives with: Adult Children   Patient Current Insurance Coverage Self-Pay   Patient Has Concern With Paying Medical Bills Yes   Patient Concerns With Medical Bills ongoing medical care, past unpaid expenses   Medical Bill Referrals: [facility]   Does Patient Have Prescription Coverage? No   Patient Prescription Assistance Programs [place] [facility]   [place] [facility] Medications pt application submitted, if approved will send medications   Other Assistance Programs Medications assistance financially provided at [facility] on 6/28   Home Assistive Devices/Equipment Eyeglasses; Dentures (specify type); CBG Meter; Other (Comment)  boot   DME Agency [employer]   [facility] Agency [facility]   Current home services DME  rolling walker, wheelchair       Social History:                                                                              SDOH Screenings   Alcohol Screen: Low Risk    Last Alcohol Screening Score (AUDIT): 0  Depression (PHQ2-9): Medium Risk   PHQ-2 Score: 17  Financial Resource Strain: High Risk   Difficulty of Paying Living Expenses: Hard  Food Insecurity: No Food Insecurity   Worried About Programme researcher, broadcasting/film/video in the Last Year: Never true   Ran Out of Food in the Last Year: Never true  Housing: Low Risk    Last Housing Risk Score: 0  Physical Activity: Not on file  Social Connections:  Not on file  Stress: Not on file  Tobacco Use: Medium Risk   Smoking Tobacco Use: Former   Smokeless Tobacco Use: Never   Passive Exposure: Not on file  Transportation Needs: No Transportation Needs   Lack of Transportation (Medical): No   Lack of Transportation (Non-Medical): No    SDOH Interventions: Financial Resources:    Tree surgeon for Disability application assistance- pending app  Food Insecurity:  Not assessed  Housing Insecurity:  Not assessed  Transportation:   Not assessed      Follow-up plan:    Referral sent out to paramedic for review and assignment  Will continue to follow and assist as needed  Burna Sis, LCSW Clinical Social Worker Advanced Heart Failure Clinic Desk#: [phone] Cell#: [phone]

## 2021-12-25 NOTE — Telephone Encounter (Signed)
I shared this response with Rosetta Posner, LCSW

## 2021-12-30 ENCOUNTER — Telehealth (HOSPITAL_COMMUNITY): Payer: Self-pay

## 2021-12-30 NOTE — Telephone Encounter (Signed)
Spoke to Adam James who is agreeable to paramedicine home visit next week on Wednesday. I will call and confirm a time with him early next week. Call complete.

## 2021-12-31 ENCOUNTER — Other Ambulatory Visit (HOSPITAL_COMMUNITY): Payer: Self-pay

## 2021-12-31 ENCOUNTER — Other Ambulatory Visit (HOSPITAL_BASED_OUTPATIENT_CLINIC_OR_DEPARTMENT_OTHER): Payer: Self-pay

## 2021-12-31 MED ORDER — AMIODARONE HCL 200 MG PO TABS
200.0000 mg | ORAL_TABLET | Freq: Every day | ORAL | 3 refills | Status: DC
  Filled 2021-12-31 (×2): qty 30, 30d supply, fill #0

## 2021-12-31 MED ORDER — ATORVASTATIN CALCIUM 80 MG PO TABS
80.0000 mg | ORAL_TABLET | Freq: Every day | ORAL | 3 refills | Status: DC
Start: 2021-12-31 — End: 2022-01-07
  Filled 2021-12-31 (×2): qty 30, 30d supply, fill #0

## 2021-12-31 MED ORDER — DIGOXIN 125 MCG PO TABS
0.1250 mg | ORAL_TABLET | Freq: Every day | ORAL | 6 refills | Status: DC
  Filled 2021-12-31 (×2): qty 30, 30d supply, fill #0

## 2021-12-31 MED ORDER — EMPAGLIFLOZIN 10 MG PO TABS
10.0000 mg | ORAL_TABLET | Freq: Every day | ORAL | 6 refills | Status: DC
  Filled 2021-12-31 (×2): qty 30, 30d supply, fill #0

## 2021-12-31 MED ORDER — FUROSEMIDE 40 MG PO TABS: 40.0000 mg | ORAL_TABLET | Freq: Every day | ORAL | 11 refills | Status: DC

## 2021-12-31 NOTE — Progress Notes (Incomplete)
***In Progress***    Advanced Heart Failure Clinic Note   PCP: Newlin, Enobong, MD PCP-Cardiologist: Brian Munley, MD  HF Cardiologist: Dr. McLean  HPI:  Adam James is a 50 y.o.with a history CAD, Had PCI 2007 in [place]. 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   Admitted to [facility] in February 2022 with lower extremity edema and foot ulcer. BNP and HS Trop elevated and felt to be non-ACS.  Cardiology consulted and he was placed on Lasix.    He had follow up with Dr Munley 09/09/20 and was set up for cath. Cath with multivessel CAD. Saw Dr Lightfoot in the office with plans for CMRI. He was referred to SW due to financial concerns and no payor source. CMRI showed EF 40% and viable myocardium.     Saw Dr Lightfoot in July 2022 and was set up for CABG in August. Pre CABG labs completed and showed WBC 25. He was advised to go to the ED.    Admitted 02/25/2021 with left foot infection complicated by osteomyelitis and necrotizing fasciitis. 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 Hospital course complicated by erosive esophagitis on EGD. Recommendations for protonix 40 mg BID + repeat EGD in 8 weeks. Received multiple transfusions. Discharged 03/15/21.    Admitted 03/23/21 with A/C CHF exacerbation. Started on IV Lasix. Cardiology consulted. CO-OX 55%. Given Lasix 80 mg IV x1. IV Lasix increased to 100 mg TID and started on metolazone.  Repeat echo on 03/23/21 with LVEF of 25%, LV demonstrating global hypokinesis, and LV diastolic parameters consistent with grade II diastolic dysfunction. RHC performed 03/26/21 showed preserved cardiac output, mildly elevated PCWP, and mild pulmonary venous hypertension. LHC performed on 03/28/21 showed  minimal change from prior study in 08/2020. Ost LAD to Mid LAD 90% stenosed, mid LAD to Dist LAD 90%  stenosed, dist LAD lesion 100% stenosed.prox Cx 70% stenosed, prox RCA to Mid RCA 100% stenosed, dist RCA 100% stenosed, ramus 70% stenosed, 1st Mrg 50% stenosed, mid LM to Dist LM 60% stenosed. Hospitalization c/b AKI on CKD 3.    After full treatment of foot infection, he was admitted in 04/2021 for CABG with LIMA-LAD, SVG-OM1, SVG-OM3.  PDA too small to graft.    Follow up 06/2021, he was hypovolemic and spironolactone and torsemide stopped. He was to be seen for f/u in 3 weeks but did not show up for the appointment.    Underwent L BKA 07/2021 d/t osteomyelitis and abscess.   Admitted 10/2021 with a/c CHF. Diuresed with IV Lasix. Echo showed EF 20-25%, RV moderately reduced, mild to moderate Adam. GDMT restarted. Discharged home, weight 209 lbs. Refused paramedicine and PT/OT.   Returned to AHF Clinic for HF follow up with his daughter 12/24/21. Overall was feeling fine. Had plans to get fitted for prosthetic the following day. Denied palpitations, CP, dizziness, edema, or PND/Orthopnea. Reported he had not been very active physically. Appetite was ok. No fever or chills. He reported that he does not weigh at home.  He had been off most GDMT except Lasix x 1 week, stated he ran out. Lives with daughter who helps with transportation.    Today he returns to HF clinic for pharmacist medication titration. At last visit with APP, Jardiance, eplerenone, digoxin and losartan were restarted. Labs returned after visit with elevated K so he was instructed not to start losartan or eplerenone.   Shortness of   breath/dyspnea on exertion? {YES NO:22349}  Orthopnea/PND? {YES NO:22349} Edema? {YES NO:22349} Lightheadedness/dizziness? {YES NO:22349} Daily weights at home? {YES NO:22349} Blood pressure/heart rate monitoring at home? {YES NO:22349} Following low-sodium/fluid-restricted diet? {YES NO:22349}  HF Medications: Jardiance 10 mg daily Digoxin 0.125 mg daily Lasix 40 mg daily  Has the patient been  experiencing any side effects to the medications prescribed?  {YES NO:22349}  Does the patient have any problems obtaining medications due to transportation or finances?   {YES NO:22349}  Understanding of regimen: {excellent/good/fair/poor:19665} Understanding of indications: {excellent/good/fair/poor:19665} Potential of compliance: {excellent/good/fair/poor:19665} Patient understands to avoid NSAIDs. Patient understands to avoid decongestants.    Pertinent Lab Values: Serum creatinine ***, BUN ***, Potassium ***, Sodium ***, BNP ***, Magnesium ***, Digoxin ***   Vital Signs: Weight: *** (last clinic weight: ***) Blood pressure: ***  Heart rate: ***   Assessment/Plan: 1. Chronic systolic CHF: Ischemic cardiomyopathy.  Initial echo in 08/2020 with EF 40-45%.  Echo in 01/2021 with EF 30-35%.  Echo 02/2021 EF down to 25% with mild RV dysfunction and dilated IVC.  RHC 02/2021 with preserved CO, mildly elevated PCWP, mild pulmonary hypertension. S/p CABG 04/2021. Readmitted 10/2021 with CHF, had been off diuretics and missed follow up. Echo this admit (10/2021): EF 20-25%, RV moderately reduced, mild to moderate Adam. Suspect some of the reason EF not improving is d/t noncompliance.  - NYHA II, functional class difficult due to BKA. He is not volume overloaded on exam. - Continue Lasix 40 mg daily.  - Continue Jardiance 10 mg daily. - Continue digoxin 0.125 mg daily.  2. CAD: Hx PCI 2007. S/p CABG x 3 with LIMA-LAD, SVG-OM2, and SVG-OM3 04/2021. No chest pain.  - Continue ASA 81 daily.  - Continue atorvastatin 80 mg daily. 3. PVCs/VT: Noted 04/2021 post CABG.  - Continue amiodarone 200 mg daily. 4. CKD, 3a: Baseline 1.4-1.7 - BMET today. 5. GI bleeding: History of erosive esophagitis earlier in 02/2021.  He is now on Protonix.   6. 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 - Plans to get fitted for prosthesis soon. 7. DM2: A1c 10.1.  - PCP managing. 8. SDOH: Uninsured.  Reports disability and medicaid were previously denied. HFSW assisting. - Lives in [place], [place] He refused paramedicine in the past, but is now agreeable. - Rx through HF fund. He also has [place] [facility] - Reports his daughter is able to provide reliable transportation in the future  Follow up ***   Jazzmyne Rasnick, PharmD, BCPS, BCCP, CPP Heart Failure Clinic Pharmacist [phone] 

## 2022-01-01 ENCOUNTER — Other Ambulatory Visit (HOSPITAL_COMMUNITY): Payer: Self-pay

## 2022-01-02 ENCOUNTER — Other Ambulatory Visit (HOSPITAL_BASED_OUTPATIENT_CLINIC_OR_DEPARTMENT_OTHER): Payer: Self-pay

## 2022-01-02 ENCOUNTER — Other Ambulatory Visit: Payer: Self-pay

## 2022-01-02 ENCOUNTER — Other Ambulatory Visit: Payer: Self-pay | Admitting: Family Medicine

## 2022-01-02 DIAGNOSIS — G546 Phantom limb syndrome with pain: Secondary | ICD-10-CM

## 2022-01-02 DIAGNOSIS — E1142 Type 2 diabetes mellitus with diabetic polyneuropathy: Secondary | ICD-10-CM

## 2022-01-02 MED ORDER — VENLAFAXINE HCL ER 75 MG PO CP24
75.0000 mg | ORAL_CAPSULE | Freq: Every day | ORAL | 2 refills | Status: DC
Start: 2022-01-02 — End: 2022-03-31
  Filled 2022-01-02 (×2): qty 30, 30d supply, fill #0
  Filled 2022-02-09: qty 30, 30d supply, fill #1
  Filled 2022-03-16: qty 30, 30d supply, fill #2

## 2022-01-02 MED ORDER — GLIPIZIDE 5 MG PO TABS
5.0000 mg | ORAL_TABLET | Freq: Two times a day (BID) | ORAL | 2 refills | Status: DC
Start: 2022-01-02 — End: 2022-03-31
  Filled 2022-01-02 (×2): qty 60, 30d supply, fill #0
  Filled 2022-02-09: qty 60, 30d supply, fill #1
  Filled 2022-03-16: qty 60, 30d supply, fill #2

## 2022-01-02 MED ORDER — GABAPENTIN 300 MG PO CAPS
300.0000 mg | ORAL_CAPSULE | Freq: Every day | ORAL | 1 refills | Status: DC
  Filled 2022-01-02: qty 30, 30d supply, fill #0
  Filled 2022-01-02: qty 90, 90d supply, fill #0
  Filled 2022-02-09: qty 30, 30d supply, fill #1
  Filled 2022-03-16: qty 30, 30d supply, fill #2
  Filled 2022-04-23 (×2): qty 30, 30d supply, fill #3
  Filled 2022-06-15: qty 30, 30d supply, fill #4

## 2022-01-02 NOTE — Telephone Encounter (Signed)
Change in pharmacy- Rx forwarded to requested phramacy Requested Prescriptions  Pending Prescriptions Disp Refills  . gabapentin (NEURONTIN) 300 MG capsule 90 capsule 1    Sig: Take 1 capsule (300 mg total) by mouth at bedtime.     Neurology: Anticonvulsants - gabapentin Failed - 01/02/2022 10:34 AM      Failed - Cr in normal range and within 360 days    Creatinine, Ser  Date Value Ref Range Status  12/24/2021 1.34 (H) 0.61 - 1.24 mg/dL Final   Creatinine, Urine  Date Value Ref Range Status  03/02/2021 100.20 mg/dL Final         Passed - Completed PHQ-2 or PHQ-9 in the last 360 days      Passed - Valid encounter within last 12 months    Recent Outpatient Visits          3 weeks ago S/P CABG x 3   [facility] [place], Guadalupe, MD   7 months ago Acute osteomyelitis of left foot [facility])   [facility] [place], Kasandra Knudsen, PA-C      Future Appointments            In 2 months Hoy Register, MD [facility]           . glipiZIDE (GLUCOTROL) 5 MG tablet 60 tablet 2    Sig: Take 1 tablet (5 mg total) by mouth 2 (two) times daily before a meal.     Endocrinology:  Diabetes - Sulfonylureas Failed - 01/02/2022 10:34 AM      Failed - HBA1C is between 0 and 7.9 and within 180 days    Hgb A1c MFr Bld  Date Value Ref Range Status  11/05/2021 10.1 (H) 4.8 - 5.6 % Final    Comment:    (NOTE) Pre diabetes:          5.7%-6.4%  Diabetes:              >6.4%  Glycemic control for   <7.0% adults with diabetes          Failed - Cr in normal range and within 360 days    Creatinine, Ser  Date Value Ref Range Status  12/24/2021 1.34 (H) 0.61 - 1.24 mg/dL Final   Creatinine, Urine  Date Value Ref Range Status  03/02/2021 100.20 mg/dL Final         Passed - Valid encounter within last 6 months    Recent Outpatient Visits          3 weeks ago S/P CABG x 3   [facility]  [place], Victor, MD   7 months ago Acute osteomyelitis of left foot [facility])   [place] [facility] [place], Kasandra Knudsen, PA-C      Future Appointments            In 2 months Hoy Register, MD [facility]           . venlafaxine XR (EFFEXOR-XR) 75 MG 24 hr capsule 30 capsule 2    Sig: Take 1 capsule (75 mg total) by mouth daily with breakfast.     Psychiatry: Antidepressants - SNRI - desvenlafaxine & venlafaxine Failed - 01/02/2022 10:34 AM      Failed - Cr in normal range and within 360 days    Creatinine, Ser  Date Value Ref Range Status  12/24/2021 1.34 (H) 0.61 -  1.24 mg/dL Final   Creatinine, Urine  Date Value Ref Range Status  03/02/2021 100.20 mg/dL Final         Failed - Lipid Panel in normal range within the last 12 months    Cholesterol, Total  Date Value Ref Range Status  10/07/2020 167 100 - 199 mg/dL Final   Cholesterol  Date Value Ref Range Status  11/06/2021 171 0 - 200 mg/dL Final   LDL Chol Calc (NIH)  Date Value Ref Range Status  10/07/2020 108 (H) 0 - 99 mg/dL Final   LDL Cholesterol  Date Value Ref Range Status  11/06/2021 112 (H) 0 - 99 mg/dL Final    Comment:           Total Cholesterol/HDL:CHD Risk Coronary Heart Disease Risk Table                     Men   Women  1/2 Average Risk   3.4   3.3  Average Risk       5.0   4.4  2 X Average Risk   9.6   7.1  3 X Average Risk  23.4   11.0        Use the calculated Patient Ratio above and the CHD Risk Table to determine the patient's CHD Risk.        ATP III CLASSIFICATION (LDL):  <100     mg/dL   Optimal  626-948  mg/dL   Near or Above                    Optimal  130-159  mg/dL   Borderline  546-270  mg/dL   High  >350     mg/dL   Very High Performed at [facility], [address] [place], [place] [address]    HDL  Date Value Ref Range Status  11/06/2021 39 (L) >40 mg/dL Final  82/99/3716 36 (L) >39 mg/dL Final   Triglycerides   Date Value Ref Range Status  11/06/2021 100 <150 mg/dL Final         Passed - Last BP in normal range    BP Readings from Last 1 Encounters:  12/24/21 126/88         Passed - Valid encounter within last 6 months    Recent Outpatient Visits          3 weeks ago S/P CABG x 3   [facility] [place], Odette Horns, MD   7 months ago Acute osteomyelitis of left foot [facility])    [facility] [place], Kasandra Knudsen, PA-C      Future Appointments            In 2 months Hoy Register, MD [facility]

## 2022-01-02 NOTE — Telephone Encounter (Signed)
Pt called in and stated that gabapentin (NEURONTIN) 300 MG capsule can not be filled at [facility] and would like medication sent to [place]   He would also like a refill of medication glipiZIDE (GLUCOTROL) 5 MG tablet venlafaxine XR (EFFEXOR-XR) 75 MG 24 hr capsule  States provider in the hospital filled these.   [place] [facility] Phone:  [phone]  Fax:  [phone]

## 2022-01-02 NOTE — Telephone Encounter (Signed)
Requested medication (s) are due for refill today - no  Requested medication (s) are on the active medication list -yes  Future visit scheduled -yes  Last refill: 11/08/21  Notes to clinic: Rx transfer to different pharmacy- outside provider( hospital)  Requested Prescriptions  Pending Prescriptions Disp Refills   glipiZIDE (GLUCOTROL) 5 MG tablet 60 tablet 2    Sig: Take 1 tablet (5 mg total) by mouth 2 (two) times daily before a meal.     Endocrinology:  Diabetes - Sulfonylureas Failed - 01/02/2022 10:34 AM      Failed - HBA1C is between 0 and 7.9 and within 180 days    Hgb A1c MFr Bld  Date Value Ref Range Status  11/05/2021 10.1 (H) 4.8 - 5.6 % Final    Comment:    (NOTE) Pre diabetes:          5.7%-6.4%  Diabetes:              >6.4%  Glycemic control for   <7.0% adults with diabetes          Failed - Cr in normal range and within 360 days    Creatinine, Ser  Date Value Ref Range Status  12/24/2021 1.34 (H) 0.61 - 1.24 mg/dL Final   Creatinine, Urine  Date Value Ref Range Status  03/02/2021 100.20 mg/dL Final         Passed - Valid encounter within last 6 months    Recent Outpatient Visits           3 weeks ago S/P CABG x 3   [facility] [place], MD   7 months ago Acute osteomyelitis of left foot (HCC)    [facility], Cari S, PA-C       Future Appointments             In 2 months Hoy RegisterNewlin, Enobong, MD [facility]             venlafaxine XR (EFFEXOR-XR) 75 MG 24 hr capsule 30 capsule 2    Sig: Take 1 capsule (75 mg total) by mouth daily with breakfast.     Psychiatry: Antidepressants - SNRI - desvenlafaxine & venlafaxine Failed - 01/02/2022 10:34 AM      Failed - Cr in normal range and within 360 days    Creatinine, Ser  Date Value Ref Range Status  12/24/2021 1.34 (H) 0.61 - 1.24 mg/dL Final   Creatinine, Urine  Date Value Ref Range Status   03/02/2021 100.20 mg/dL Final         Failed - Lipid Panel in normal range within the last 12 months    Cholesterol, Total  Date Value Ref Range Status  10/07/2020 167 100 - 199 mg/dL Final   Cholesterol  Date Value Ref Range Status  11/06/2021 171 0 - 200 mg/dL Final   LDL Chol Calc (NIH)  Date Value Ref Range Status  10/07/2020 108 (H) 0 - 99 mg/dL Final   LDL Cholesterol  Date Value Ref Range Status  11/06/2021 112 (H) 0 - 99 mg/dL Final    Comment:           Total Cholesterol/HDL:CHD Risk Coronary Heart Disease Risk Table                     Men   Women  1/2 Average Risk   3.4   3.3  Average Risk  5.0   4.4  2 X Average Risk   9.6   7.1  3 X Average Risk  23.4   11.0        Use the calculated Patient Ratio above and the CHD Risk Table to determine the patient's CHD Risk.        ATP III CLASSIFICATION (LDL):  <100     mg/dL   Optimal  749-449  mg/dL   Near or Above                    Optimal  130-159  mg/dL   Borderline  675-916  mg/dL   High  >384     mg/dL   Very High Performed at [facility], [address], [place], [place] [address]    HDL  Date Value Ref Range Status  11/06/2021 39 (L) >40 mg/dL Final  35/70/1779 36 (L) >39 mg/dL Final   Triglycerides  Date Value Ref Range Status  11/06/2021 100 <150 mg/dL Final         Passed - Last BP in normal range    BP Readings from Last 1 Encounters:  12/24/21 126/88         Passed - Valid encounter within last 6 months    Recent Outpatient Visits           3 weeks ago S/P CABG x 3   [facility] [place], [place], MD   7 months ago Acute osteomyelitis of left foot [facility])   [place] [address] [facility] Mayers, Cari S, PA-C       Future Appointments             In 2 months Hoy Register, MD [facility]            Signed Prescriptions Disp Refills   gabapentin (NEURONTIN) 300 MG capsule 90 capsule 1     Sig: Take 1 capsule (300 mg total) by mouth at bedtime.     Neurology: Anticonvulsants - gabapentin Failed - 01/02/2022 10:34 AM      Failed - Cr in normal range and within 360 days    Creatinine, Ser  Date Value Ref Range Status  12/24/2021 1.34 (H) 0.61 - 1.24 mg/dL Final   Creatinine, Urine  Date Value Ref Range Status  03/02/2021 100.20 mg/dL Final         Passed - Completed PHQ-2 or PHQ-9 in the last 360 days      Passed - Valid encounter within last 12 months    Recent Outpatient Visits           3 weeks ago S/P CABG x 3   [facility] [place], [place], MD   7 months ago Acute osteomyelitis of left foot [facility])   [place] [facility] Mayers, Cari S, PA-C       Future Appointments             In 2 months Hoy Register, MD [facility]               Requested Prescriptions  Pending Prescriptions Disp Refills   glipiZIDE (GLUCOTROL) 5 MG tablet 60 tablet 2    Sig: Take 1 tablet (5 mg total) by mouth 2 (two) times daily before a meal.     Endocrinology:  Diabetes - Sulfonylureas Failed - 01/02/2022 10:34 AM  Failed - HBA1C is between 0 and 7.9 and within 180 days    Hgb A1c MFr Bld  Date Value Ref Range Status  11/05/2021 10.1 (H) 4.8 - 5.6 % Final    Comment:    (NOTE) Pre diabetes:          5.7%-6.4%  Diabetes:              >6.4%  Glycemic control for   <7.0% adults with diabetes          Failed - Cr in normal range and within 360 days    Creatinine, Ser  Date Value Ref Range Status  12/24/2021 1.34 (H) 0.61 - 1.24 mg/dL Final   Creatinine, Urine  Date Value Ref Range Status  03/02/2021 100.20 mg/dL Final         Passed - Valid encounter within last 6 months    Recent Outpatient Visits           3 weeks ago S/P CABG x 3   [facility] [place], [place], MD   7 months ago Acute osteomyelitis of left foot (HCC)     [facility], Cari S, PA-C       Future Appointments             In 2 months Hoy Register, MD [facility]             venlafaxine XR (EFFEXOR-XR) 75 MG 24 hr capsule 30 capsule 2    Sig: Take 1 capsule (75 mg total) by mouth daily with breakfast.     Psychiatry: Antidepressants - SNRI - desvenlafaxine & venlafaxine Failed - 01/02/2022 10:34 AM      Failed - Cr in normal range and within 360 days    Creatinine, Ser  Date Value Ref Range Status  12/24/2021 1.34 (H) 0.61 - 1.24 mg/dL Final   Creatinine, Urine  Date Value Ref Range Status  03/02/2021 100.20 mg/dL Final         Failed - Lipid Panel in normal range within the last 12 months    Cholesterol, Total  Date Value Ref Range Status  10/07/2020 167 100 - 199 mg/dL Final   Cholesterol  Date Value Ref Range Status  11/06/2021 171 0 - 200 mg/dL Final   LDL Chol Calc (NIH)  Date Value Ref Range Status  10/07/2020 108 (H) 0 - 99 mg/dL Final   LDL Cholesterol  Date Value Ref Range Status  11/06/2021 112 (H) 0 - 99 mg/dL Final    Comment:           Total Cholesterol/HDL:CHD Risk Coronary Heart Disease Risk Table                     Men   Women  1/2 Average Risk   3.4   3.3  Average Risk       5.0   4.4  2 X Average Risk   9.6   7.1  3 X Average Risk  23.4   11.0        Use the calculated Patient Ratio above and the CHD Risk Table to determine the patient's CHD Risk.        ATP III CLASSIFICATION (LDL):  <100     mg/dL   Optimal  161-096  mg/dL   Near or Above  Optimal  130-159  mg/dL   Borderline  176-160  mg/dL   High  >737     mg/dL   Very High Performed at [facility], [address] [place], [place] [address]    HDL  Date Value Ref Range Status  11/06/2021 39 (L) >40 mg/dL Final  94/85/4627 36 (L) >39 mg/dL Final   Triglycerides  Date Value Ref Range Status  11/06/2021 100 <150 mg/dL Final         Passed -  Last BP in normal range    BP Readings from Last 1 Encounters:  12/24/21 126/88         Passed - Valid encounter within last 6 months    Recent Outpatient Visits           3 weeks ago S/P CABG x 3   [facility] [place], [place], MD   7 months ago Acute osteomyelitis of left foot [facility])   [place] [address] [facility] Mayers, Cari S, PA-C       Future Appointments             In 2 months Hoy Register, MD [facility]            Signed Prescriptions Disp Refills   gabapentin (NEURONTIN) 300 MG capsule 90 capsule 1    Sig: Take 1 capsule (300 mg total) by mouth at bedtime.     Neurology: Anticonvulsants - gabapentin Failed - 01/02/2022 10:34 AM      Failed - Cr in normal range and within 360 days    Creatinine, Ser  Date Value Ref Range Status  12/24/2021 1.34 (H) 0.61 - 1.24 mg/dL Final   Creatinine, Urine  Date Value Ref Range Status  03/02/2021 100.20 mg/dL Final         Passed - Completed PHQ-2 or PHQ-9 in the last 360 days      Passed - Valid encounter within last 12 months    Recent Outpatient Visits           3 weeks ago S/P CABG x 3   [facility] [place], [place], MD   7 months ago Acute osteomyelitis of left foot [facility])   [place] [facility] [place], Kasandra Knudsen, PA-C       Future Appointments             In 2 months Hoy Register, MD [facility]

## 2022-01-05 ENCOUNTER — Other Ambulatory Visit: Payer: Self-pay

## 2022-01-07 ENCOUNTER — Other Ambulatory Visit: Payer: Self-pay

## 2022-01-07 ENCOUNTER — Other Ambulatory Visit (HOSPITAL_COMMUNITY): Payer: Self-pay | Admitting: *Deleted

## 2022-01-07 ENCOUNTER — Other Ambulatory Visit (HOSPITAL_COMMUNITY): Payer: Self-pay

## 2022-01-07 MED ORDER — EMPAGLIFLOZIN 10 MG PO TABS
10.0000 mg | ORAL_TABLET | Freq: Every day | ORAL | 3 refills | Status: DC
  Filled 2022-01-07: qty 30, 30d supply, fill #0
  Filled 2022-02-09: qty 30, 30d supply, fill #1
  Filled 2022-03-17: qty 30, 30d supply, fill #2
  Filled 2022-04-20: qty 30, 30d supply, fill #3
  Filled 2022-04-20: qty 14, 14d supply, fill #0
  Filled 2022-06-15: qty 14, 14d supply, fill #1

## 2022-01-07 MED ORDER — DIGOXIN 125 MCG PO TABS
0.1250 mg | ORAL_TABLET | Freq: Every day | ORAL | 3 refills | Status: DC
Start: 2022-01-07 — End: 2022-06-15
  Filled 2022-01-07: qty 30, 30d supply, fill #0
  Filled 2022-02-09: qty 30, 30d supply, fill #1
  Filled 2022-03-16: qty 30, 30d supply, fill #2
  Filled 2022-04-20 – 2022-04-23 (×2): qty 30, 30d supply, fill #3
  Filled 2022-04-29: qty 30, 30d supply, fill #0

## 2022-01-07 MED ORDER — ATORVASTATIN CALCIUM 80 MG PO TABS
80.0000 mg | ORAL_TABLET | Freq: Every day | ORAL | 3 refills | Status: DC
  Filled 2022-01-07: qty 30, 30d supply, fill #0
  Filled 2022-02-09: qty 30, 30d supply, fill #1
  Filled 2022-03-16: qty 30, 30d supply, fill #2
  Filled 2022-04-20 – 2022-04-23 (×2): qty 30, 30d supply, fill #3
  Filled 2022-04-29: qty 30, 30d supply, fill #0

## 2022-01-07 MED ORDER — AMIODARONE HCL 200 MG PO TABS
200.0000 mg | ORAL_TABLET | Freq: Every day | ORAL | 3 refills | Status: DC
  Filled 2022-01-07: qty 30, 30d supply, fill #0
  Filled 2022-02-09: qty 30, 30d supply, fill #1

## 2022-01-07 MED ORDER — FUROSEMIDE 40 MG PO TABS
40.0000 mg | ORAL_TABLET | Freq: Every day | ORAL | 3 refills | Status: DC
  Filled 2022-01-07: qty 30, 30d supply, fill #0
  Filled 2022-02-09: qty 30, 30d supply, fill #1
  Filled 2022-03-16: qty 30, 30d supply, fill #2
  Filled 2022-04-20 – 2022-04-23 (×2): qty 30, 30d supply, fill #3

## 2022-01-08 ENCOUNTER — Other Ambulatory Visit (HOSPITAL_COMMUNITY): Payer: Self-pay

## 2022-01-08 NOTE — Progress Notes (Signed)
Paramedicine Encounter    Patient ID: Adam James, male    DOB: [birth year], 51 y.o.   MRN: [id number]   Arrived for home visit for Adam James who reports he is feeling good with no complaints today. He met me outside on the porch of his home at his request. HE denied any shortness of breath, dizziness or chest pain. Lower right leg noted to be swollen on exam. He reports he is compliant with his medications. I reminded him of limiting salt and fluid intake.  picked up all prescriptions today as they were due for refill. He prefers to take them from the bottles and does not wish to use a pill box.   He and I reviewed upcoming appointments and he wishes to have pharmacy visit rescheduled for an early morning time as his daughter has returned to work and she is his means of transportation to and from appointments. I will reach out to clinic staff to have this changed.   He also is wanting a repeat ECHO completed. I advised him that typically this is scheduled by provider and I would inquire for him.   He also reports that currently he is waiting on disability to be approved so he can have his medicaid approved. His disability was denied initially but he reports having a lawyer who is helping with his case.   He also stated he receives food stamps but is unsure of how much but would appreciate some help with food insecurity.  I will reach out to [place] LCSW about same.   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 He says the back porch has a ramp but it is falling apart. I will seek resources in community to see if there are any ways to get porch/ramp rebuilt as he says his landlord has not offered to fix it.   He agreed with bi-weekly visits. I will follow up on above info and relay it back to him. He is grateful for same.   I also mailed off  letter for him today of some documents disability office needed for his case.   Home visit complete. I will follow up in two weeks.   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   Acute exacerbation of CHF (congestive heart failure) ([place]) 04/28/2021   Venous stasis ulcer of right calf limited to breakdown of skin without varicose veins (HCC)    Acute on chronic combined systolic and diastolic CHF (congestive heart failure) ([place])    Acute on chronic heart failure ([place]) 03/23/2021   NSTEMI (non-ST elevated myocardial infarction) ([place])    Gastroesophageal reflux disease with esophagitis and hemorrhage    Anemia, posthemorrhagic, acute    Acute esophagitis    Gastrointestinal hemorrhage    Necrotizing fasciitis ([place])  Diabetic polyneuropathy associated with type 2 diabetes mellitus (HCC)    Severe protein-calorie malnutrition (HCC)    Type 2 diabetes mellitus ([place])    Heart failure (HCC)    Dyslipidemia    CAD (coronary artery disease)    Cardiomyopathy ([place])    Coronary artery disease involving native coronary artery of native heart with angina pectoris (HCC)     Current Outpatient Medications:    albuterol (VENTOLIN HFA) 108 (90 Base) MCG/ACT inhaler, Inhale 2 puffs into the lungs every 4 (four) hours as needed for shortness of breath or wheezing., Disp: , Rfl:    amiodarone (PACERONE) 200 MG tablet, Take 1 tablet (200 mg total) by  mouth daily., Disp: 30 tablet, Rfl: 3   aspirin 81 MG EC tablet, Take 1 tablet (81 mg total) by mouth daily., Disp: 30 tablet, Rfl: 11   atorvastatin (LIPITOR) 80 MG tablet, Take 1 tablet (80 mg total) by mouth daily., Disp: 30 tablet, Rfl: 3   digoxin (LANOXIN) 0.125 MG tablet, Take 1 tablet (0.125 mg total) by mouth daily., Disp: 30 tablet, Rfl: 3   empagliflozin (JARDIANCE) 10 MG TABS tablet, Take 1 tablet (10 mg total) by mouth daily., Disp: 30 tablet, Rfl: 3   furosemide (LASIX) 40 MG tablet, Take 1 tablet (40 mg total) by mouth daily., Disp: 30 tablet, Rfl: 3   gabapentin (NEURONTIN) 300 MG capsule, Take 1 capsule (300 mg total) by mouth at bedtime., Disp: 90 capsule, Rfl: 1   glipiZIDE (GLUCOTROL) 5 MG tablet, Take 1 tablet (5 mg total) by mouth 2 (two) times daily before a meal., Disp: 60 tablet, Rfl: 2   glucose blood (TRUE METRIX BLOOD GLUCOSE TEST) test strip, Use as instructed twice daily, Disp: 100 each, Rfl: 12   TRUEplus Lancets 28G MISC, Use as directed twice daily at 8 am and 10 pm., Disp: 100 each, Rfl: 11   venlafaxine XR (EFFEXOR-XR) 75 MG 24 hr capsule, Take 1 capsule (75 mg total) by mouth daily with breakfast., Disp: 30 capsule, Rfl: 2 Allergies  Allergen Reactions   Morphine Itching     Social History   Socioeconomic History   Marital status: Single    Spouse name: Not on file   Number of children: 1   Years of education: Not on file   Highest education level: 8th grade  Occupational History   Occupation: none    Comment: former "[employer]"  Tobacco Use   Smoking status: Former    Packs/day: 3.00    Years: 36.00    Total pack years: 108.00    Types: Cigarettes    Quit date: 08/27/2020    Years since quitting: 1.3   Smokeless tobacco: Never   Tobacco comments:    5-6 daily  Vaping Use   Vaping Use: Never used  Substance and Sexual Activity   Alcohol use: Not Currently    Comment: Very rare   Drug use: Not Currently    Types: Marijuana   Sexual activity:  Not on file  Other Topics Concern   Not on file  Social History Narrative   Not on file   Social Determinants of Health   Financial Resource Strain: High Risk (07/11/2021)   Overall Financial Resource Strain (CARDIA)    Difficulty of Paying Living Expenses: Hard  Food Insecurity: No Food Insecurity (07/11/2021)   Hunger Vital Sign    Worried About Running Out of Food in the Last Year: Never true    Ran Out of Food in the  Last Year: Never true  Transportation Needs: No Transportation Needs (11/06/2021)   PRAPARE - Hydrologist (Medical): No    Lack of Transportation (Non-Medical): No  Physical Activity: Not on file  Stress: Not on file  Social Connections: Not on file  Intimate Partner Violence: Not on file    Physical Exam      Future Appointments  Date Time Provider [place]  01/15/2022  1:00 PM [place] None  02/23/2022  2:00 PM Larey Dresser, MD [facility] None  03/31/2022  3:10 PM Charlott Rakes, MD [facility] None     ACTION: Home visit completed

## 2022-01-09 NOTE — Progress Notes (Incomplete)
***In Progress***    Advanced Heart Failure Clinic Note   PCP: Adam Register, MD PCP-Cardiologist: Adam Herrlich, MD  HF Cardiologist: Dr. Shirlee James  HPI:  [name] is a 51 y.o.with a history CAD, Had PCI 2007 in [place]. 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   Admitted to [facility] in February 2022 with lower extremity edema and foot ulcer. BNP and HS Trop elevated and felt to be non-ACS.  Cardiology consulted and he was placed on Lasix.    He had follow up with Dr Adam James 09/09/20 and was set up for cath. Cath with multivessel CAD. Saw Dr Adam James in the office with plans for CMRI. He was referred to SW due to financial concerns and no payor source. CMRI showed EF 40% and viable myocardium.     Saw Dr Adam James in July 2022 and was set up for CABG in August. Pre CABG labs completed and showed WBC 25. He was advised to go to the ED.    Admitted 02/25/2021 with left foot infection complicated by osteomyelitis and necrotizing fasciitis. 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 Hospital course complicated by erosive esophagitis on EGD. Recommendations for protonix 40 mg BID + repeat EGD in 8 weeks. Received multiple transfusions. Discharged 03/15/21.    Admitted 03/23/21 with A/C CHF exacerbation. Started on IV Lasix. Cardiology consulted. CO-OX 55%. Given Lasix 80 mg IV x1. IV Lasix increased to 100 mg TID and started on metolazone.  Repeat echo on 03/23/21 with LVEF of 25%, LV demonstrating global hypokinesis, and LV diastolic parameters consistent with grade II diastolic dysfunction. RHC performed 03/26/21 showed preserved cardiac output, mildly elevated PCWP, and mild pulmonary venous hypertension. LHC performed on 03/28/21 showed  minimal change from prior study in 08/2020. Ost LAD to Mid LAD 90% stenosed, mid LAD to Dist LAD 90%  stenosed, dist LAD lesion 100% stenosed.prox Cx 70% stenosed, prox RCA to Mid RCA 100% stenosed, dist RCA 100% stenosed, ramus 70% stenosed, 1st Mrg 50% stenosed, mid LM to Dist LM 60% stenosed. Hospitalization c/b AKI on CKD 3.    After full treatment of foot infection, he was admitted in 04/2021 for CABG with LIMA-LAD, SVG-OM1, SVG-OM3.  PDA too small to graft.    Follow up 06/2021, he was hypovolemic and spironolactone and torsemide stopped. He was to be seen for f/u in 3 weeks but did not show up for the appointment.    Underwent L BKA 07/2021 d/t osteomyelitis and abscess.   Admitted 10/2021 with a/c CHF. Diuresed with IV Lasix. Echo showed EF 20-25%, RV moderately reduced, mild to moderate MR. GDMT restarted. Discharged home, weight 209 lbs. Refused paramedicine and PT/OT.   [facility]ed to [facility] for HF follow up with his daughter 12/24/21. Overall was feeling fine. Had plans to get fitted for prosthetic the following day. Denied palpitations, CP, dizziness, edema, or PND/Orthopnea. Reported he had not been very active physically. Appetite was ok. No fever or chills. He reported that he does not weigh at home.  He had been off most GDMT except Lasix x 1 week, stated he ran out. Lives with daughter who helps with transportation.    Today he returns to HF clinic for pharmacist medication titration. At last visit with APP, Jardiance, eplerenone, digoxin and losartan were restarted. Labs returned after visit with elevated K so he was instructed not to start losartan or eplerenone.   Shortness of  breath/dyspnea on exertion? {YES NO:22349}  Orthopnea/PND? {YES NO:22349} Edema? {YES NO:22349} Lightheadedness/dizziness? {YES NO:22349} Daily weights at home? {YES NO:22349} Blood pressure/heart rate monitoring at home? {YES [id number] Following low-sodium/fluid-restricted diet? {YES NO:22349}  HF Medications: Jardiance 10 mg daily Digoxin 0.125 mg daily Lasix 40 mg daily  Has the patient been  experiencing any side effects to the medications prescribed?  {YES NO:22349}  Does the patient have any problems obtaining medications due to transportation or finances?   {YES NO:22349}  Understanding of regimen: {excellent/good/fair/poor:19665} Understanding of indications: {excellent/good/fair/poor:19665} Potential of compliance: {excellent/good/fair/poor:19665} Patient understands to avoid NSAIDs. Patient understands to avoid decongestants.    Pertinent Lab Values: Serum creatinine ***, BUN ***, Potassium ***, Sodium ***, BNP ***, Magnesium ***, Digoxin ***   Vital Signs: Weight: *** (last clinic weight: ***) Blood pressure: ***  Heart rate: ***   Assessment/Plan: 1. Chronic systolic CHF: Ischemic cardiomyopathy.  Initial echo in 08/2020 with EF 40-45%.  Echo in 01/2021 with EF 30-35%.  Echo 02/2021 EF down to 25% with mild RV dysfunction and dilated IVC.  RHC 02/2021 with preserved CO, mildly elevated PCWP, mild pulmonary hypertension. S/p CABG 04/2021. Readmitted 10/2021 with CHF, had been off diuretics and missed follow up. Echo this admit (10/2021): EF 20-25%, RV moderately reduced, mild to moderate MR. Suspect some of the reason EF not improving is d/t noncompliance.  - NYHA II, functional class difficult due to BKA. He is not volume overloaded on exam. - Continue Lasix 40 mg daily.  - Continue Jardiance 10 mg daily. - Continue digoxin 0.125 mg daily.  2. CAD: Hx PCI 2007. S/p CABG x 3 with LIMA-LAD, SVG-OM2, and SVG-OM3 04/2021. No chest pain.  - Continue ASA 81 daily.  - Continue atorvastatin 80 mg daily. 3. PVCs/VT: Noted 04/2021 post CABG.  - Continue amiodarone 200 mg daily. 4. CKD, 3a: Baseline 1.4-1.7 - BMET today. 5. GI bleeding: History of erosive esophagitis earlier in 02/2021.  He is now on Protonix.   6. 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 - Plans to get fitted for prosthesis soon. 7. DM2: A1c 10.1.  - PCP managing. 8. SDOH: Uninsured.  Reports disability and medicaid were previously denied. HFSW assisting. - Lives in [place], [place] He refused paramedicine in the past, but is now agreeable. - Rx through HF fund. He also has [facility] - Reports his daughter is able to provide reliable transportation in the future  Follow up ***   Karle Plumber, PharmD, BCPS, BCCP, CPP Heart Failure Clinic Pharmacist [phone]

## 2022-01-12 ENCOUNTER — Telehealth (HOSPITAL_COMMUNITY): Payer: Self-pay | Admitting: Licensed Clinical Social Worker

## 2022-01-12 NOTE — Telephone Encounter (Signed)
H&V Care Navigation CSW Progress Note  Clinical Social Worker contacted patient by phone to discuss concerns with obtaining food and with home repairs.  SDOH Screenings   Alcohol Screen: Low Risk  (03/26/2021)   Alcohol Screen    Last Alcohol Screening Score (AUDIT): 0  Depression (PHQ2-9): Medium Risk (12/08/2021)   Depression (PHQ2-9)    PHQ-2 Score: 17  Financial Resource Strain: High Risk (07/11/2021)   Overall Financial Resource Strain (CARDIA)    Difficulty of Paying Living Expenses: Hard  Food Insecurity: Food Insecurity Present (01/12/2022)   Hunger Vital Sign    Worried About Running Out of Food in the Last Year: Sometimes true    Ran Out of Food in the Last Year: Sometimes true  Housing: Low Risk  (11/06/2021)   Housing    Last Housing Risk Score: 0  Physical Activity: Not on file  Social Connections: Not on file  Stress: Not on file  Tobacco Use: Medium Risk (12/24/2021)   Patient History    Smoking Tobacco Use: Former    Smokeless Tobacco Use: Never    Passive Exposure: Not on file  Transportation Needs: No Transportation Needs (11/06/2021)   PRAPARE - Administrator, Civil Service (Medical): No    Lack of Transportation (Non-Medical): No   Pt reports he lost food stamps after problem with recertification- he has reapplied but struggling somewhat in the meantime.  CSW discussed pt going to food pantries but not agreeable at this time.  CSW will plan to discuss clinic food pantry during visit on Thursday and assess for other options.  Pt also utilizing wheelchair but does not have access to a safe ramp- his back porch needs repairs.  CSW made referral to resource finder in [place] through [facility]- awaiting referral response- continuing to look into other options as well.  Burna Sis, LCSW Clinical Social Worker [facility] Desk#: [phone] Cell#: [phone]

## 2022-01-15 ENCOUNTER — Inpatient Hospital Stay (HOSPITAL_COMMUNITY)
Admission: RE | Admit: 2022-01-15 | Discharge: 2022-01-15 | Disposition: A | Discharge status: Home / Self Care | Payer: Medicaid Other | Source: Ambulatory Visit

## 2022-01-15 ENCOUNTER — Inpatient Hospital Stay (HOSPITAL_COMMUNITY): Admission: RE | Admit: 2022-01-15 | Payer: Medicaid Other | Source: Ambulatory Visit

## 2022-01-22 ENCOUNTER — Telehealth (HOSPITAL_COMMUNITY): Payer: Self-pay

## 2022-01-22 NOTE — Telephone Encounter (Signed)
Attempted to reach Mr. Lillette Boxer for paramedicine outreach with no success. I will attempt again when I return from vacation on week of 7/10.   Call complete.    Maralyn Sago, EMT-Paramedic [phone] 01/22/2022

## 2022-02-02 NOTE — Addendum Note (Signed)
Encounter addended by: Evon Slack, RPH-CPP on: 02/02/2022 1:16 PM  Actions taken: Pend clinical note, Clinical Note Signed, Delete clinical note

## 2022-02-04 ENCOUNTER — Telehealth (HOSPITAL_COMMUNITY): Payer: Self-pay

## 2022-02-04 ENCOUNTER — Telehealth (HOSPITAL_COMMUNITY): Payer: Self-pay | Admitting: Licensed Clinical Social Worker

## 2022-02-04 NOTE — Telephone Encounter (Signed)
H&V Care Navigation CSW Progress Note  Clinical Social Worker contacted patient by phone to discuss food insecurity.   SDOH Screenings   Alcohol Screen: Low Risk  (03/26/2021)   Alcohol Screen    Last Alcohol Screening Score (AUDIT): 0  Depression (PHQ2-9): Medium Risk (12/08/2021)   Depression (PHQ2-9)    PHQ-2 Score: 17  Financial Resource Strain: High Risk (07/11/2021)   Overall Financial Resource Strain (CARDIA)    Difficulty of Paying Living Expenses: Hard  Food Insecurity: Food Insecurity Present (02/04/2022)   Hunger Vital Sign    Worried About Running Out of Food in the Last Year: Sometimes true    Ran Out of Food in the Last Year: Sometimes true  Housing: Low Risk  (11/06/2021)   Housing    Last Housing Risk Score: 0  Physical Activity: Not on file  Social Connections: Not on file  Stress: Not on file  Tobacco Use: Medium Risk (12/24/2021)   Patient History    Smoking Tobacco Use: Former    Smokeless Tobacco Use: Never    Passive Exposure: Not on file  Transportation Needs: No Transportation Needs (11/06/2021)   PRAPARE - Administrator, Civil Service (Medical): No    Lack of Transportation (Non-Medical): No    CSW informed by [employer] that pt is experiencing food insecurity at this time.  Pt was just approved for food stamps in May and reports he got it May and June but didn't get it this month (was supposed to get it on the 5th). He has been trying to get a hold of his case worker but has been unable to and now is very low on food.  Has accessed local food pantry but unaware of all available options- CSW sent list.  CSW also offered to send food bag and gift cards with paramedic for their medical visit next week as pt does not have transportation to get to clinic- pt agreeable and grateful for this option.  Will continue to follow and assist as needed  Burna Sis, LCSW Clinical Social Worker [facility] Desk#:  [phone] Cell#: [phone]

## 2022-02-04 NOTE — Telephone Encounter (Signed)
Sent text to Srijan to set up home visit for next Tuesday for paramedicine visit and delivering food items from HF clinic LCSW. Pending his response I will plan to see him on Tuesday July 18th.    Maralyn Sago, EMT-Paramedic [phone] 02/04/2022

## 2022-02-05 ENCOUNTER — Other Ambulatory Visit (HOSPITAL_COMMUNITY): Payer: Medicaid Other

## 2022-02-05 ENCOUNTER — Telehealth: Payer: Self-pay | Admitting: Orthopedic Surgery

## 2022-02-05 NOTE — Telephone Encounter (Signed)
Patient called asked if  he need to come into the office to be seen. Patient said he fell 2 week ago and there is a place on the stump that 's open and fluid is draining out of it sometimes. Patient said the end of the stump is red.   Patient asked if a letter can be written to DSS in [facility] stating that he he can not work.  Patient said he does not have his prosthetic leg yet.   The number to contact patient is [phone]

## 2022-02-06 ENCOUNTER — Other Ambulatory Visit: Payer: Self-pay

## 2022-02-06 ENCOUNTER — Other Ambulatory Visit: Payer: Self-pay | Admitting: Family

## 2022-02-06 MED ORDER — SULFAMETHOXAZOLE-TRIMETHOPRIM 800-160 MG PO TABS
1.0000 | ORAL_TABLET | Freq: Two times a day (BID) | ORAL | 0 refills | Status: DC
  Filled 2022-02-06: qty 20, 10d supply, fill #0

## 2022-02-06 NOTE — Telephone Encounter (Signed)
SW pt, he says area is red and tender like it is bruised. Fall happened on 01/12/22 and went to [facility] ED, had xray done, nothing broken, still hurting him though. Area on tip of stump is red w/ some clear drainage, flat no scabbing. No bleeding. He denies fever and chills. Will consult with Denny Peon to see what she would like to do for him.

## 2022-02-06 NOTE — Telephone Encounter (Signed)
Scheduled 02/10/22. Please send in abx, thank you! [facility] [address].

## 2022-02-06 NOTE — Telephone Encounter (Signed)
Denny Peon is sending in Abx and he can f/u next week with [place].

## 2022-02-09 ENCOUNTER — Telehealth (HOSPITAL_COMMUNITY): Payer: Self-pay

## 2022-02-09 ENCOUNTER — Other Ambulatory Visit (HOSPITAL_COMMUNITY): Payer: Self-pay

## 2022-02-09 ENCOUNTER — Other Ambulatory Visit: Payer: Self-pay

## 2022-02-09 NOTE — Telephone Encounter (Signed)
Reached out to [facility] and [facility] for refills for [name] as he texted me over the weekend reporting he would be running out of them this week. Both pharmacies will have prescriptions ready this afternoon, I will pick up and deliver to him tomorrow. Call complete.   Maralyn Sago, EMT-Paramedic [phone] 02/09/2022

## 2022-02-10 ENCOUNTER — Ambulatory Visit: Payer: Medicaid Other | Admitting: Family

## 2022-02-10 ENCOUNTER — Other Ambulatory Visit (HOSPITAL_COMMUNITY): Payer: Self-pay

## 2022-02-10 NOTE — Progress Notes (Signed)
Paramedicine Encounter    Patient ID: Adam James, male    DOB: [birth year], 51 y.o.   MRN: [id number]   Arrived for home visit for Adam James  reports feeling good with no complaints of shortness of breath, dizziness or chest pain. He reports he has been med compliant and needed refills on all meds so I picked them up at [facility] delivering same today. He is taking meds from pill bottles and does not want a pill box.   Vitals obtained: BP-160/100 HR- 88 O2- 98% RR- 18   No lower leg edema noted.  Lungs clear.   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   I brought food and [employer] Gift Cards from HF clinic today and had Meir sign appropriate forms for same.   Hernandez reports no other needs at this time. I advised him if any needs arise to call me and he agreed. I plan to see [place] in two weeks in clinic.   Home visit complete.   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   Acute exacerbation of CHF (congestive heart failure) (HCC) 04/28/2021   Venous stasis ulcer of right calf limited to breakdown of skin without varicose veins (HCC)    Acute on chronic combined systolic and diastolic CHF (congestive heart failure) (HCC)    Acute on chronic heart failure (HCC) 03/23/2021   NSTEMI (non-ST  elevated myocardial infarction) (HCC)    Gastroesophageal reflux disease with esophagitis and hemorrhage    Anemia, posthemorrhagic, acute    Acute esophagitis    Gastrointestinal hemorrhage    Necrotizing fasciitis (HCC)    Diabetic polyneuropathy associated with type 2 diabetes mellitus (HCC)    Severe protein-calorie malnutrition (HCC)    Type 2 diabetes mellitus (HCC)    Heart failure (HCC)    Dyslipidemia    CAD (coronary artery disease)    Cardiomyopathy (HCC)    Coronary artery disease involving native coronary artery of native heart with angina pectoris (HCC)     Current Outpatient Medications:    albuterol (VENTOLIN HFA) 108 (90 Base) MCG/ACT inhaler, Inhale 2 puffs into the lungs every 4 (four) hours as needed for shortness of breath or wheezing., Disp: , Rfl:    amiodarone (PACERONE) 200 MG tablet, Take 1 tablet (200 mg total) by mouth daily., Disp: 30 tablet, Rfl: 3   aspirin EC 81 MG tablet, Take 1 tablet (81 mg total) by mouth daily., Disp: 30 tablet, Rfl: 11   atorvastatin (LIPITOR) 80 MG tablet, Take 1 tablet (80 mg total) by mouth daily., Disp: 30 tablet, Rfl: 3   digoxin (LANOXIN) 0.125 MG tablet, Take 1 tablet (0.125 mg total) by mouth daily., Disp: 30 tablet, Rfl: 3   empagliflozin (JARDIANCE) 10 MG TABS tablet, Take 1 tablet (10 mg total) by mouth daily., Disp: 30 tablet, Rfl: 3  furosemide (LASIX) 40 MG tablet, Take 1 tablet (40 mg total) by mouth daily., Disp: 30 tablet, Rfl: 3   gabapentin (NEURONTIN) 300 MG capsule, Take 1 capsule (300 mg total) by mouth at bedtime., Disp: 90 capsule, Rfl: 1   glipiZIDE (GLUCOTROL) 5 MG tablet, Take 1 tablet (5 mg total) by mouth 2 (two) times daily before a meal., Disp: 60 tablet, Rfl: 2   glucose blood (TRUE METRIX BLOOD GLUCOSE TEST) test strip, Use as instructed twice daily, Disp: 100 each, Rfl: 12   sulfamethoxazole-trimethoprim (BACTRIM DS) 800-160 MG tablet, Take 1 tablet by mouth 2 (two) times daily., Disp: 20 tablet, Rfl:  0   TRUEplus Lancets 28G MISC, Use as directed twice daily at 8 am and 10 pm., Disp: 100 each, Rfl: 11   venlafaxine XR (EFFEXOR-XR) 75 MG 24 hr capsule, Take 1 capsule (75 mg total) by mouth daily with breakfast., Disp: 30 capsule, Rfl: 2 Allergies  Allergen Reactions   Morphine Itching     Social History   Socioeconomic History   Marital status: Single    Spouse name: Not on file   Number of children: 1   Years of education: Not on file   Highest education level: 8th grade  Occupational History   Occupation: none    Comment: former "[employer]"  Tobacco Use   Smoking status: Former    Packs/day: 3.00    Years: 36.00    Total pack years: 108.00    Types: Cigarettes    Quit date: 08/27/2020    Years since quitting: 1.4   Smokeless tobacco: Never   Tobacco comments:    5-6 daily  Vaping Use   Vaping Use: Never used  Substance and Sexual Activity   Alcohol use: Not Currently    Comment: Very rare   Drug use: Not Currently    Types: Marijuana   Sexual activity: Not on file  Other Topics Concern   Not on file  Social History Narrative   Not on file   Social Determinants of Health   Financial Resource Strain: High Risk (07/11/2021)   Overall Financial Resource Strain (CARDIA)    Difficulty of Paying Living Expenses: Hard  Food Insecurity: Food Insecurity Present (02/04/2022)   Hunger Vital Sign    Worried About Running Out of Food in the Last Year: Sometimes true    Ran Out of Food in the Last Year: Sometimes true  Transportation Needs: No Transportation Needs (11/06/2021)   PRAPARE - Administrator, Civil Service (Medical): No    Lack of Transportation (Non-Medical): No  Physical Activity: Not on file  Stress: Not on file  Social Connections: Not on file  Intimate Partner Violence: Not on file    Physical Exam      Future Appointments  Date Time Provider Department Center  02/23/2022  2:00 PM Laurey Morale, MD [facility] None  03/31/2022  3:10 PM  Hoy Register, MD [facility] None     ACTION: Home visit completed

## 2022-02-11 ENCOUNTER — Telehealth (HOSPITAL_COMMUNITY): Payer: Self-pay | Admitting: Licensed Clinical Social Worker

## 2022-02-11 NOTE — Telephone Encounter (Signed)
HF Paramedicine Team Based Care Meeting  HF MD- NA  HF NP - Amy Clegg NP-C   [facility] HF Paramedicine  Katie Vicente Males  [facility] admit within the last 30 days for heart failure?   Medications concerns? Won't allow me to do pill box  Transportation issues ? Yes but a little limited- dtr has car  SDOH - food insecurity- has been given food pantry list and food bag  Eligible for discharge? Should be soon- will help establish him with delivery for meds prior to DC  Burna Sis, LCSW Clinical Social Worker [facility] Desk#: [phone] Cell#: [phone]

## 2022-02-12 ENCOUNTER — Telehealth (HOSPITAL_COMMUNITY): Payer: Self-pay

## 2022-02-12 ENCOUNTER — Other Ambulatory Visit: Payer: Self-pay

## 2022-02-12 NOTE — Telephone Encounter (Signed)
No need to apologize. We can send the HF fund covered medications to [facility] to be mailed but not all of the scripts are covered. The patient would be charged for the medications that are outside of the HF fund list. Are we filling them all of just the ones covered on the HF fund?  With that being said, does he have income? We have new requirements on AR accounts and they are starting to close accounts that have no payments after a certain amount of time.  Thank you  Marisue Ivan

## 2022-02-12 NOTE — Telephone Encounter (Signed)
Spoke with Barbaraann Barthel at HF Paramedicine meeting where we discussed moving [name] towards discharge from program however I wanted to ensure he has a way to get his medications and she advised that he could get his medications delivered through HF Fund at [facility] which is where he is currently getting his HF meds as he is uninsured and has limited finances and resources for transportation and lives in [place].   Laurel advised me to reach out to [facility] for further assistance with setting up delivery for meds. I will forward message to her to have her set this up.   Note- he just received 30 day supply of meds on 7/18.   I will reach out to [facility] to have them set up delivery of meds for his non- HF meds through their pharmacy.   Maralyn Sago, EMT-Paramedic [phone] 02/12/2022

## 2022-02-12 NOTE — Telephone Encounter (Signed)
Sandi Mariscal,   For this patient, is he set up for delivery for the medications filled here by Dr. Alvis Lemmings?

## 2022-02-12 NOTE — Telephone Encounter (Signed)
Does this patient still have [facility]?

## 2022-02-12 NOTE — Telephone Encounter (Signed)
Med assistance for Mr. Lillette Boxer-  [place] this to [place] at [facility] to have him assist me in setting up delivery of meds for a patient who has limited transportation resources- [place] and is currently [place]. He is getting HF meds through HF Fund at [facility] and I am working on getting delivery set up for those meds but he will need his other meds from [facility]. Note he just received 30 day supply of meds on 7/18.   I will continue to follow up.   Maralyn Sago, EMT-Paramedic [phone] 02/12/2022

## 2022-02-12 NOTE — Telephone Encounter (Signed)
Sorry I'm digging to find more information before I advise what we should do. Im not as familiar with [facility]. Does the patient no longer have coverage and needs to renew or does he need to request a refill before they will deliver?  Thanks

## 2022-02-13 ENCOUNTER — Other Ambulatory Visit (HOSPITAL_COMMUNITY): Payer: Self-pay

## 2022-02-13 NOTE — Telephone Encounter (Signed)
Sorry Adam James, I dont know if I routed the message back to you so I'm doing it again.

## 2022-02-18 ENCOUNTER — Telehealth (HOSPITAL_COMMUNITY): Payer: Self-pay

## 2022-02-18 NOTE — Telephone Encounter (Signed)
Attempted to speak to [name] in regards to following up on medication management for delivery. Both [facility] are able to deliver meds but need a card on file for billing. He did not answer so I left a message and also sent him a text informing him of this. I will continue to follow up.   Maralyn Sago, EMT-Paramedic [phone] 02/18/2022

## 2022-02-19 ENCOUNTER — Other Ambulatory Visit: Payer: Self-pay

## 2022-02-19 ENCOUNTER — Telehealth (HOSPITAL_COMMUNITY): Payer: Self-pay | Admitting: Licensed Clinical Social Worker

## 2022-02-20 ENCOUNTER — Other Ambulatory Visit: Payer: Self-pay

## 2022-02-20 NOTE — Telephone Encounter (Signed)
H&V Care Navigation CSW Progress Note  Clinical Social Worker informed by [employer] that pt needing help with getting medications.  Lives in [place] so hard for him to get to the pharmacy here in [place]- paramedic wants to set pt up with mail order but they won't do it without having card on file which pt does not have.  CSW spoke with pharmacy and confirmed we could pay copays in advance then meds could be mailed to him- no meds due until mid August so told pt to call CSW when they are ready to mail and let me know the copays and I could assist for a month or so while we he awaits disability determination.  SDOH Screenings   Alcohol Screen: Low Risk  (03/26/2021)   Alcohol Screen    Last Alcohol Screening Score (AUDIT): 0  Depression (PHQ2-9): Medium Risk (12/08/2021)   Depression (PHQ2-9)    PHQ-2 Score: 17  Financial Resource Strain: High Risk (02/20/2022)   Overall Financial Resource Strain (CARDIA)    Difficulty of Paying Living Expenses: Hard  Food Insecurity: Food Insecurity Present (02/04/2022)   Hunger Vital Sign    Worried About Running Out of Food in the Last Year: Sometimes true    Ran Out of Food in the Last Year: Sometimes true  Housing: Low Risk  (11/06/2021)   Housing    Last Housing Risk Score: 0  Physical Activity: Not on file  Social Connections: Not on file  Stress: Not on file  Tobacco Use: Medium Risk (12/24/2021)   Patient History    Smoking Tobacco Use: Former    Smokeless Tobacco Use: Never    Passive Exposure: Not on file  Transportation Needs: No Transportation Needs (11/06/2021)   PRAPARE - Administrator, Civil Service (Medical): No    Lack of Transportation (Non-Medical): No    Burna Sis, LCSW Clinical Social Worker [facility] Desk#: [phone] Cell#: [phone]

## 2022-02-23 ENCOUNTER — Ambulatory Visit (HOSPITAL_COMMUNITY)
Admission: RE | Admit: 2022-02-23 | Discharge: 2022-02-23 | Disposition: A | Discharge status: Home / Self Care | Payer: Medicaid Other | Source: Ambulatory Visit | Attending: Cardiology | Admitting: Cardiology

## 2022-02-23 ENCOUNTER — Other Ambulatory Visit (HOSPITAL_COMMUNITY): Payer: Self-pay

## 2022-02-23 ENCOUNTER — Encounter (HOSPITAL_COMMUNITY): Payer: Self-pay | Admitting: Cardiology

## 2022-02-23 VITALS — BP 110/72 | HR 91

## 2022-02-23 DIAGNOSIS — Z89512 Acquired absence of left leg below knee: Secondary | ICD-10-CM | POA: Insufficient documentation

## 2022-02-23 DIAGNOSIS — Z597 Insufficient social insurance and welfare support: Secondary | ICD-10-CM | POA: Insufficient documentation

## 2022-02-23 DIAGNOSIS — I255 Ischemic cardiomyopathy: Secondary | ICD-10-CM | POA: Insufficient documentation

## 2022-02-23 DIAGNOSIS — Z7982 Long term (current) use of aspirin: Secondary | ICD-10-CM | POA: Insufficient documentation

## 2022-02-23 DIAGNOSIS — E11628 Type 2 diabetes mellitus with other skin complications: Secondary | ICD-10-CM | POA: Insufficient documentation

## 2022-02-23 DIAGNOSIS — Z951 Presence of aortocoronary bypass graft: Secondary | ICD-10-CM | POA: Insufficient documentation

## 2022-02-23 DIAGNOSIS — I25118 Atherosclerotic heart disease of native coronary artery with other forms of angina pectoris: Secondary | ICD-10-CM

## 2022-02-23 DIAGNOSIS — I272 Pulmonary hypertension, unspecified: Secondary | ICD-10-CM | POA: Insufficient documentation

## 2022-02-23 DIAGNOSIS — I130 Hypertensive heart and chronic kidney disease with heart failure and stage 1 through stage 4 chronic kidney disease, or unspecified chronic kidney disease: Secondary | ICD-10-CM | POA: Insufficient documentation

## 2022-02-23 DIAGNOSIS — I5022 Chronic systolic (congestive) heart failure: Secondary | ICD-10-CM | POA: Insufficient documentation

## 2022-02-23 DIAGNOSIS — Z79899 Other long term (current) drug therapy: Secondary | ICD-10-CM | POA: Insufficient documentation

## 2022-02-23 DIAGNOSIS — Z7984 Long term (current) use of oral hypoglycemic drugs: Secondary | ICD-10-CM | POA: Insufficient documentation

## 2022-02-23 DIAGNOSIS — L089 Local infection of the skin and subcutaneous tissue, unspecified: Secondary | ICD-10-CM | POA: Insufficient documentation

## 2022-02-23 DIAGNOSIS — E1122 Type 2 diabetes mellitus with diabetic chronic kidney disease: Secondary | ICD-10-CM | POA: Insufficient documentation

## 2022-02-23 DIAGNOSIS — I2510 Atherosclerotic heart disease of native coronary artery without angina pectoris: Secondary | ICD-10-CM | POA: Insufficient documentation

## 2022-02-23 DIAGNOSIS — N183 Chronic kidney disease, stage 3 unspecified: Secondary | ICD-10-CM | POA: Insufficient documentation

## 2022-02-23 DIAGNOSIS — I493 Ventricular premature depolarization: Secondary | ICD-10-CM | POA: Insufficient documentation

## 2022-02-23 MED ORDER — CARVEDILOL 3.125 MG PO TABS
3.1250 mg | ORAL_TABLET | Freq: Two times a day (BID) | ORAL | 3 refills | Status: DC
  Filled 2022-02-23: qty 60, 30d supply, fill #0
  Filled 2022-04-20 – 2022-04-23 (×2): qty 60, 30d supply, fill #1
  Filled 2022-04-29: qty 60, 30d supply, fill #0
  Filled 2022-06-15: qty 60, 30d supply, fill #1

## 2022-02-23 MED ORDER — AMIODARONE HCL 200 MG PO TABS
100.0000 mg | ORAL_TABLET | Freq: Every day | ORAL | 3 refills | Status: DC
  Filled 2022-02-23 – 2022-03-16 (×2): qty 30, 60d supply, fill #0
  Filled 2022-04-20 – 2022-06-15 (×2): qty 30, 60d supply, fill #1

## 2022-02-23 NOTE — Progress Notes (Signed)
Paramedicine Encounter    Patient ID: Adam James, male    DOB: [birth year], 51 y.o.   MRN: [id number]  Met with Adam James in clinic today where he was seen by Dr. Aundra Dubin. Adam James denied any complaints stated he feels fine. He reports he has all meds at home but didn't bring them today.   I talked with Dr. Aundra Dubin about discharging Adam James from paramedicine as he has his meds being delivered currently [place] and him having limited transportation. He James with this plan and Adam James. Adam James set up pharmacy delivery with [facility] and HF Fund at [facility].   Today Dr. Aundra Dubin added Carvedilol 3.115m BID and decreased his Amiodarone to 1035mdaily.   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   Adam James sending letter to case worker for food stamps.   Disability is still pending.   He was denied medicaid, plans to reapply once disability is approved.   Adam James/c from paramedicine as of today.   Patient is now discharged from [employer] Patient has/has not met the following goals:  Yes :Patient expresses basic understanding of medications and what they are for Yes :Patient able to verbalize heart failure specific dietary/fluid restrictions Yes :Patient is aware of who to call if they have medical concerns or if they need to schedule or change appts Yes :Patient has a scale for daily weights and weighs regularly No :Patient able to verbalize concerning symptoms when they should call the HF clinic (weight gain ranges, etc) Yes :Patient has a PCP and has seen within the past year or has upcoming appt Yes :Patient has reliable access to getting their medications Yes :Patient has shown they are able to reorder medications reliably Yes :Patient has had admission in past 30 days- if yes how many? No :Patient has had admission in past 90 days- if yes how many?         ACTION: Home visit completed     HeSalena [id number]/31/2023

## 2022-02-23 NOTE — Progress Notes (Signed)
Advanced Heart Failure Clinic Note    PCP: Charlott Rakes, MD PCP-Cardiologist: Shirlee More, MD  HF Cardiologist: Dr. Aundra Dubin  HPI: [name] is a 51 y.o.with a history CAD, Had PCI 2007 in [place]. 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   Admitted to [facility] in February 2022 with lower extremity edema and foot ulcer. BNP and HS Trop elevated and felt to be non-ACS.  Cardiology consulted and he was placed on lasix.    He had follow up with Dr Bettina Gavia 09/09/20 and was set up for cath. Cath with multivessel CAD. Saw Dr Kipp Brood in the office with plans for CMRI. He was referred to SW due to financial concerns and no payor source. CMRI showed EF 40% and viable myocardium.     Saw Dr Kipp Brood in July 2022 and was set up for CABG in August. Pre CABG labs completed and showed WBC 25. He was advised to go to the ED.    Admitted 02/25/2021 with left foot infection complicated by osteomyelitis and necrotizing fasciitis. 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 Hospital course complicated by erosive esophagitis on EGD. Recommendations for protonix 40 mg bid + repeat EGD in 8 weeks. Received multiple transfusions. Discharged 03/15/21.    Admitted 03/23/21 with A/C CHF exacerbation. Started on IV lasix. Cardiology consulted. CO-OX 55%. Given 80 mg IV x1. V lasix increased to 100 mg tid and started on metolazone.  Repeat echo on 03/23/21 with LVEF of 25%, LV demonstrating global hypokinesis, and LV diastolic parameters consistent with grade II diastolic dysfunction. RHC performed 03/26/21 showed preserved cardiac output, mildly elevated PCWP, and mild pulmonary venous hypertension. LHC performed on 03/28/21 showed  minimal change from prior study in 2/22. Ost LAD to Mid LAD 90% stenosed, mid LAD to Dist LAD 90% stenosed, dist LAD lesion 100%  stenosed.prox Cx 70% stenosed, prox RCA to Mid RCA 100% stenosed, dist RCA 100% stenosed, ramus 70% stenosed, 1st Mrg 50% stenosed, mid LM to Dist LM 60% stenosed. Hospitalization c/b AKI on CKD 3.   After full treatment of foot infection, he was admitted in 10/22 for CABG with LIMA-LAD, SVG-OM1, SVG-OM3.  PDA too small to graft.   Underwent L BKA 1/23 due to osteomyelitis and abscess.  Admitted 4/23 with acute on chronic CHF. Diuresed with IV lasix. Echo in 4/23 showed EF 20-25%, RV moderately reduced, mild to moderate MR. GDMT restarted. Discharged home, weight 209 lbs. Refused paramedicine and PT/OT.  Today he returns for HF follow up with his daughter.  He had trauma to his left-sided BKA site recently and reports clear drainage.  No fever or redness at site.  He is off Entresto due to low BP.  He is no longer taking eplerenone. He is using a wheelchair and can go short distances with his walker.  He does not have a prosthesis yet.  No chest pain. No dyspnea wheeling his chair. No orthopnea/PND.  He is not smoking.   ECG (personally reviewed): NSR, LVH with repolarization abnormality.   Labs (11/22): K 4.2, creatinine 1.38 Labs (12/22): K 4.4, creatinine 1.94 Labs (4/23): K 4.2, creatinine 2.1 Labs (5/23): K 4.7 => 5.3, creatinine 1.6 => 1.34  PMH: 1. GI bleeding: Erosive esophagitis 8/22.  2. CKD stage 3 3. Type 2 diabetes 4. Hyperlipidemia 5. Left foot/leg diabetic infection with necrotizing fasciitis: 8/22, debridements were done and the left 5th metatarsal was amputated.  1/23 had left BKA.  6. Prior smoker 7. H/o PVC, VT: On amiodarone.  8. CAD: LHC in 9/22 with severe 3 vessel disease.  - CABG (10/22) with LIMA-LAD, SVG-OM2, SVG-OM3.  9. Chronic systolic CHF: Ischemic cardiomyopathy.  - Echo (8/22): LVEF of 25%, LV demonstrating global hypokinesis, and LV diastolic parameters consistent with grade II diastolic dysfunction.  - Echo (4/23): EF 20-25%, severe LV dysfunction, RV  moderately reduced, RVSP 46.8 mmHg, mild to moderate MR  Review of Systems: All systems reviewed and negative except as per HPI.   Current Outpatient Medications  Medication Sig Dispense Refill   albuterol (VENTOLIN HFA) 108 (90 Base) MCG/ACT inhaler Inhale 2 puffs into the lungs every 4 (four) hours as needed for shortness of breath or wheezing.     aspirin EC 81 MG tablet Take 1 tablet (81 mg total) by mouth daily. 30 tablet 11   atorvastatin (LIPITOR) 80 MG tablet Take 1 tablet (80 mg total) by mouth daily. 30 tablet 3   carvedilol (COREG) 3.125 MG tablet Take 1 tablet (3.125 mg total) by mouth 2 (two) times daily. 180 tablet 3   digoxin (LANOXIN) 0.125 MG tablet Take 1 tablet (0.125 mg total) by mouth daily. 30 tablet 3   empagliflozin (JARDIANCE) 10 MG TABS tablet Take 1 tablet (10 mg total) by mouth daily. 30 tablet 3   furosemide (LASIX) 40 MG tablet Take 1 tablet (40 mg total) by mouth daily. 30 tablet 3   gabapentin (NEURONTIN) 300 MG capsule Take 1 capsule (300 mg total) by mouth at bedtime. 90 capsule 1   glipiZIDE (GLUCOTROL) 5 MG tablet Take 1 tablet (5 mg total) by mouth 2 (two) times daily before a meal. 60 tablet 2   glucose blood (TRUE METRIX BLOOD GLUCOSE TEST) test strip Use as instructed twice daily 100 each 12   TRUEplus Lancets 28G MISC Use as directed twice daily at 8 am and 10 pm. 100 each 11   venlafaxine XR (EFFEXOR-XR) 75 MG 24 hr capsule Take 1 capsule (75 mg total) by mouth daily with breakfast. 30 capsule 2   amiodarone (PACERONE) 200 MG tablet Take 0.5 tablets (100 mg total) by mouth daily. 30 tablet 3   No current facility-administered medications for this encounter.   Allergies  Allergen Reactions   Morphine Itching   Social History   Socioeconomic History   Marital status: Single    Spouse name: Not on file   Number of children: 1   Years of education: Not on file   Highest education level: 8th grade  Occupational History   Occupation: none     Comment: former "[employer]"  Tobacco Use   Smoking status: Former    Packs/day: 3.00    Years: 36.00    Total pack years: 108.00    Types: Cigarettes    Quit date: 08/27/2020    Years since quitting: 1.4   Smokeless tobacco: Never   Tobacco comments:    5-6 daily  Vaping Use   Vaping Use: Never used  Substance and Sexual Activity   Alcohol use: Not Currently    Comment: Very rare   Drug use: Not Currently    Types: Marijuana   Sexual activity: Not on file  Other Topics Concern   Not on file  Social History Narrative   Not on file   Social Determinants of Health   Financial Resource Strain: High Risk (02/20/2022)   Overall Financial Resource Strain (CARDIA)    Difficulty of Paying  Living Expenses: Hard  Food Insecurity: Food Insecurity Present (02/04/2022)   Hunger Vital Sign    Worried About Running Out of Food in the Last Year: Sometimes true    Ran Out of Food in the Last Year: Sometimes true  Transportation Needs: No Transportation Needs (11/06/2021)   PRAPARE - Hydrologist (Medical): No    Lack of Transportation (Non-Medical): No  Physical Activity: Not on file  Stress: Not on file  Social Connections: Not on file  Intimate Partner Violence: Not on file   Family History  Problem Relation Age of Onset   Hypertension Mother    Heart failure Father    Hypertension Brother    Congestive Heart Failure Brother    BP 110/72   Pulse 91   SpO2 97%   Wt Readings from Last 3 Encounters:  12/24/21 97.7 kg (215 lb 6.4 oz)  11/19/21 89.7 kg (197 lb 12.8 oz)  11/08/21 95 kg (209 lb 6.4 oz)   PHYSICAL EXAM: General: NAD Neck: No JVD, no thyromegaly or thyroid nodule.  Lungs: Clear to auscultation bilaterally with normal respiratory effort. CV: Nondisplaced PMI.  Heart regular S1/S2, no S3/S4, no murmur.  No peripheral edema.  No carotid bruit.  Difficult to palpate right pedal pulses.  Abdomen: Soft, nontender, no hepatosplenomegaly, no  distention.  Skin: Intact without lesions or rashes.  Neurologic: Alert and oriented x 3.  Psych: Normal affect. Extremities: No clubbing or cyanosis. Left BKA.  HEENT: Normal.   ASSESSMENT & PLAN: 1. Chronic systolic CHF: Ischemic cardiomyopathy.  Initial echo in 2/22 with EF 40-45%.  Echo in 7/22 with EF 30-35%.  Echo 8/22 EF down to 25% with mild RV dysfunction and dilated IVC.  [place] 8/22 with preserved CO, mildly elevated PCWP, mild pulmonary hypertension. S/p CABG 10/22. Readmitted 4/23 with CHF, had been off diuretics and missed follow up. Echo in 4/23 with EF 20-25%, RV moderately reduced, mild to moderate MR. NYHA II though functional class difficult due to L BKA.  He is not volume overloaded on exam.  - Continue Lasix 40 mg daily. BMET today. - Continue Jardiance 10 mg daily. - He is not taking eplerenone, will not start until I see that K is controlled (mildly high on 5/23 BMET).  - Continue digoxin 0.125 mg daily. Check digoxin level today.  - He is off Entresto due to soft BP.  - Start Coreg 3.125 mg bid.  - He qualifies for an ICD but does not want to get one until echo is repeated again. I will repeat echo at 2 months after ongoing medication titration to determine need for ICD.  2. CAD: Hx PCI 2007. S/p CABG x 3 with LIMA-LAD, SVG-OM2, and SVG-OM3 10/22. No chest pain.  - Continue ASA 81 daily.  - Continue atorvastatin 80 mg daily. 3. PVCs/VT: Noted 10/22 post CABG.  - Decrease amiodarone to 100 mg daily.  Check TSH and LFTs, he will need regular eye exam.  4. CKD stage 3: BMET today.  5. GI bleeding: History of erosive esophagitis earlier in 8/22.  He is now on Protonix.   6. Left leg diabetic foot infection: Underwent left BKA 1/23. He has some drainage at the site, missed his last appointment with Dr. Sharol Given.  - Needs to reschedule appt with Dr. Sharol Given.  - He needs to eventually be fitted with prosthesis.  7. DM2:  PCP managing. 8. SDOH: Uninsured. Reports disability and  Medicaid were previously denied. HFSW  assisting. - Lives in [place]).  - Rx through HF fund. - Reports his daughter is able to provide reliable transportation in the future   Follow up with PharmD in 3 weeks x 2 visits for medication titration.  See me in 2 months with echo.   Marca Ancona 02/23/22

## 2022-02-23 NOTE — Patient Instructions (Signed)
Decrease amiodarone to 100mg  daily.  Start Carvedilol 3.125mg  Twice daily  Your physician has requested that you have an echocardiogram. Echocardiography is a painless test that uses sound waves to create images of your heart. It provides your doctor with information about the size and shape of your heart and how well your heart's chambers and valves are working. This procedure takes approximately one hour. There are no restrictions for this procedure.  Please follow up with Dr ASAP to have your stump re checked.  Please follow up with our heart failure pharmacist in 3  weeks.  Your physician recommends that you schedule a follow-up appointment in: 2 months with echocardiogram   If you have any questions or concerns before your next appointment please send Lajoyce Corners a message through [place] or call our office at [phone].    TO LEAVE A MESSAGE FOR THE NURSE SELECT OPTION 2, PLEASE LEAVE A MESSAGE INCLUDING: YOUR NAME DATE OF BIRTH CALL BACK NUMBER REASON FOR CALL**this is important as we prioritize the call backs  YOU WILL RECEIVE A CALL BACK THE SAME DAY AS LONG AS YOU CALL BEFORE 4:00 PM  At the [facility] Clinic, you and your health needs are our priority. As part of our continuing mission to provide you with exceptional heart care, we have created designated Provider Care Teams. These Care Teams include your primary Cardiologist (physician) and Advanced Practice Providers (APPs- Physician Assistants and Nurse Practitioners) who all work together to provide you with the care you need, when you need it.   You may see any of the following providers on your designated Care Team at your next follow up: Dr [phone] Dr Arvilla Meres, NP Carron Curie, Robbie Lis [facility] [place], [name], PharmD   Please be sure to bring in all your medications bottles to every appointment.

## 2022-02-23 NOTE — Progress Notes (Signed)
H&V Care Navigation CSW Progress Note  Clinical Social Worker met with pt in clinic to discuss issues with food stamps.  Pt reports that his food stamps are going to be cut off in Sept unless he gets letter from MD stating that he is unable to work.  CSW able to obtain letter and faxed to case worker.  SDOH Screenings   Alcohol Screen: Low Risk  (03/26/2021)   Alcohol Screen    Last Alcohol Screening Score (AUDIT): 0  Depression (PHQ2-9): Medium Risk (12/08/2021)   Depression (PHQ2-9)    PHQ-2 Score: 17  Financial Resource Strain: High Risk (02/20/2022)   Overall Financial Resource Strain (CARDIA)    Difficulty of Paying Living Expenses: Hard  Food Insecurity: Food Insecurity Present (02/04/2022)   Hunger Vital Sign    Worried About Running Out of Food in the Last Year: Sometimes true    Ran Out of Food in the Last Year: Sometimes true  Housing: Low Risk  (11/06/2021)   Housing    Last Housing Risk Score: 0  Physical Activity: Not on file  Social Connections: Not on file  Stress: Not on file  Tobacco Use: Medium Risk (02/23/2022)   Patient History    Smoking Tobacco Use: Former    Smokeless Tobacco Use: Never    Passive Exposure: Not on file  Transportation Needs: No Transportation Needs (11/06/2021)   PRAPARE - Transportation    Lack of Transportation (Medical): No    Lack of Transportation (Non-Medical): No    Will continue to follow and assist as needed  [name], [place] [facility] Desk#: [phone] Cell#: [phone]

## 2022-03-03 ENCOUNTER — Ambulatory Visit (INDEPENDENT_AMBULATORY_CARE_PROVIDER_SITE_OTHER): Payer: Self-pay | Admitting: Family

## 2022-03-03 ENCOUNTER — Ambulatory Visit: Payer: Self-pay

## 2022-03-03 ENCOUNTER — Encounter: Payer: Self-pay | Admitting: Family

## 2022-03-03 DIAGNOSIS — S81802A Unspecified open wound, left lower leg, initial encounter: Secondary | ICD-10-CM

## 2022-03-03 NOTE — Progress Notes (Unsigned)
Office Visit Note   Patient: Adam James           Date of Birth: [birth year]           MRN: [id number] Visit Date: 03/03/2022              Requested by: Hoy Register, MD [address] [place],  [place] [address] PCP: Hoy Register, MD  Chief Complaint  Patient presents with   Left Leg - Follow-up    08/05/21 left BKA      HPI: The patient is a 51 year old gentleman who presents today for initial evaluation of a wound to his left shin.  He is status post left below-knee amputation and states he had a fall in the bathroom and landed directly on top of the toilet bowl.  He has a puncture wound this has been ongoing for about 3 weeks without improvement he has recently completed a course of Bactrim for the same  There is associated pain and clear drainage no redness or swelling  Assessment & Plan: Visit Diagnoses: No diagnosis found.  Plan: Will place the patient on a course of Augmentin.  He will pack open the wound with mupirocin and gauze.  Continue shrinker.  Discussed return precautions will follow-up in the office with Dr. Lajoyce Corners in 2 weeks.  Discussed with patient concerning for extension to bone possibility of further limb salvage surgery with revision below-knee amputation  Follow-Up Instructions: No follow-ups on file.   Ortho Exam  Patient is alert, oriented, no adenopathy, well-dressed, normal affect, normal respiratory effort. On examination of the left residual limb just lateral to the distal tibia he has ulceration this is 6 mm in diameter this does probe 1 cm deep.  There is flat pink tissue in the wound bed.  No active drainage mild surrounding erythema and tenderness  Imaging: No results found.   Labs: Lab Results  Component Value Date   HGBA1C 10.1 (H) 11/05/2021   HGBA1C 7.9 (H) 07/30/2021   HGBA1C 6.5 (H) 05/20/2021   ESRSEDRATE 25 (H) 03/14/2021   ESRSEDRATE >140 (H) 02/25/2021   CRP 1.6 (H) 03/14/2021   REPTSTATUS 05/06/2021  FINAL 04/30/2021   GRAMSTAIN NO WBC SEEN NO ORGANISMS SEEN  04/30/2021   CULT  04/30/2021    RARE PROVIDENCIA STUARTII RARE ALCALIGENES FAECALIS RARE NORMAL SKIN FLORA CRITICAL RESULT CALLED TO, READ BACK BY AND VERIFIED WITH: RN MARY S. [id number] FCP NO ANAEROBES ISOLATED Performed at [facility], [address] [place], [place] [address]    LABORGA PROVIDENCIA STUARTII 04/30/2021   LABORGA ALCALIGENES FAECALIS 04/30/2021     Lab Results  Component Value Date   ALBUMIN 3.9 (L) 12/08/2021   ALBUMIN 2.2 (L) 11/07/2021   ALBUMIN 2.1 (L) 11/06/2021    Lab Results  Component Value Date   MG 2.1 12/08/2021   MG 2.2 11/07/2021   MG 1.6 (L) 11/06/2021   No results found for: "VD25OH"  No results found for: "PREALBUMIN"    Latest Ref Rng & Units 12/08/2021    3:09 PM 11/06/2021    1:20 AM 11/05/2021    8:40 AM  CBC EXTENDED  WBC 3.4 - 10.8 x10E3/uL 9.8  8.6  6.5   RBC 4.14 - 5.80 x10E6/uL 4.62  3.53  3.72   Hemoglobin 13.0 - 17.7 g/dL 13.2  9.9  44.0   HCT 10.2 - 51.0 % 38.0  30.5  32.6   Platelets 150 - 450 x10E3/uL 261  246  201   NEUT# 1.4 - 7.0 x10E3/uL 5.3  4.7    Lymph# 0.7 - 3.1 x10E3/uL 3.6  3.0       There is no height or weight on file to calculate BMI.  Orders:  No orders of the defined types were placed in this encounter.  No orders of the defined types were placed in this encounter.    Procedures: No procedures performed  Clinical Data: No additional findings.  ROS:  All other systems negative, except as noted in the HPI. Review of Systems  Objective: Vital Signs: There were no vitals taken for this visit.  Specialty Comments:  No specialty comments available.  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   Acute exacerbation of CHF (congestive heart failure) (HCC) 04/28/2021   Venous stasis ulcer of right calf limited to breakdown of skin without varicose veins (HCC)    Acute on chronic combined systolic and diastolic CHF (congestive heart failure) (HCC)    Acute on chronic heart failure (HCC) 03/23/2021   NSTEMI (non-ST elevated myocardial infarction) (HCC)    Gastroesophageal reflux disease with esophagitis and hemorrhage    Anemia, posthemorrhagic, acute    Acute esophagitis    Gastrointestinal hemorrhage    Necrotizing fasciitis (HCC)    Diabetic polyneuropathy associated with type 2 diabetes mellitus (HCC)    Severe protein-calorie malnutrition (HCC)    Type 2 diabetes mellitus (HCC)    Heart failure (HCC)    Dyslipidemia    CAD (coronary artery disease)    Cardiomyopathy (HCC)    Coronary artery disease involving native coronary artery of native heart with angina pectoris (HCC)    Past Medical History:  Diagnosis Date   Anemia    GI bleed; 03/13/11 EGD: relux esophagitis with ulceration/clot   CAD (coronary artery disease)    Cardiomyopathy (HCC)    CHF (congestive heart failure) (HCC)    CKD (chronic kidney disease)    Diabetic foot ulcer (HCC)    Dyslipidemia    Dyspnea    Heart failure (HCC)    Hypertension    Myocardial infarction (HCC)    Type 2 diabetes mellitus (HCC)     Family History  Problem Relation Age of Onset   Hypertension Mother    Heart failure Father    Hypertension Brother    Congestive Heart Failure Brother     Past Surgical History:  Procedure Laterality Date   AMPUTATION Left 04/30/2021   Procedure: LEFT FOOT 5TH RAY AMPUTATION APPLICATION OF A-CELL POWDER;  Surgeon: [name], MD;  Location: [facility] OR;  Service: Orthopedics;  Laterality: Left;   AMPUTATION Left 07/30/2021   Procedure: LEFT BELOW KNEE AMPUTATION;  Surgeon: [name], MD;  Location: [facility] OR;   Service: Orthopedics;  Laterality: Left;   APPENDECTOMY     APPLICATION OF WOUND VAC Left 03/03/2021   Procedure: APPLICATION OF WOUND VAC;  Surgeon: Tarry Kos, MD;  Location: [facility] OR;  Service: Orthopedics;  Laterality: Left;   APPLICATION OF WOUND VAC  03/07/2021   Procedure: APPLICATION OF WOUND VAC;  Surgeon: [name], MD;  Location[facility] OR;  Service: Orthopedics;;   APPLICATION OF WOUND VAC  07/30/2021   Procedure: APPLICATION OF WOUND VAC;  Surgeon: [name], MD;  Location: [facility] OR;  Service: Orthopedics;;   CARDIAC CATHETERIZATION     COLON SURGERY     CORONARY ARTERY BYPASS GRAFT N/A 05/22/2021   Procedure: CORONARY ARTERY BYPASS GRAFTING (CABG) X 3 USING LEFT INTERNAL MAMMARY ARTERY AND RIGHT ENDOSCOPIC GREATER SAPHENOUS VEIN CONDUITS;  Surgeon: Corliss Skains, MD;  Location: [facility] OR;  Service: Open Heart Surgery;  Laterality: N/A;   ENDOVEIN HARVEST OF GREATER SAPHENOUS VEIN Right 05/22/2021   Procedure: ENDOVEIN HARVEST OF GREATER SAPHENOUS VEIN;  Surgeon: Corliss Skains, MD;  Location: [facility] OR;  Service: Open Heart Surgery;  Laterality: Right;   ESOPHAGOGASTRODUODENOSCOPY (EGD) WITH PROPOFOL N/A 03/12/2021   Procedure: ESOPHAGOGASTRODUODENOSCOPY (EGD) WITH PROPOFOL;  Surgeon: Jenel Lucks, MD;  Location: [facility] ENDOSCOPY;  Service: Gastroenterology;  Laterality: N/A;   I & D EXTREMITY Left 02/24/2021   Procedure: IRRIGATION AND DEBRIDEMENT EXTREMITY;  Surgeon: Myrene Galas, MD;  Location: [facility] OR;  Service: Orthopedics;  Laterality: Left;   I & D EXTREMITY Left 02/26/2021   Procedure: IRRIGATION AND DEBRIDEMENT OF LEG;  Surgeon: [name], MD;  Location: [facility] OR;  Service: Orthopedics;  Laterality: Left;   I & D EXTREMITY Left 03/03/2021   Procedure: LEFT LEG IRRIGATION AND DEBRIDEMENT;  Surgeon: Tarry Kos, MD;  Location: [facility] OR;  Service: Orthopedics;  Laterality: Left;   I & D EXTREMITY Left 03/07/2021   Procedure: REPEAT DEBRIDEMENT LEFT LEG;  Surgeon: [name], MD;  Location: [facility] OR;  Service: Orthopedics;  Laterality: Left;   IR FLUORO GUIDE CV LINE RIGHT  03/10/2021   IR REMOVAL TUN CV CATH W/O FL  03/24/2021   IR US GUIDE VASC ACCESS RIGHT  03/10/2021   LEFT HEART CATH AND CORONARY ANGIOGRAPHY N/A 09/13/2020   Procedure: LEFT HEART CATH AND CORONARY ANGIOGRAPHY;  Surgeon: Lennette Bihari, MD;  Location: [facility] INVASIVE CV LAB;  Service: Cardiovascular;  Laterality: N/A;   LEFT HEART CATH AND CORONARY ANGIOGRAPHY N/A 03/28/2021   Procedure: LEFT HEART CATH AND CORONARY ANGIOGRAPHY;  Surgeon: Laurey Morale, MD;  Location: [facility] INVASIVE CV LAB;  Service: Cardiovascular;  Laterality: N/A;   RIGHT HEART CATH N/A 03/26/2021   Procedure: RIGHT HEART CATH;  Surgeon: Laurey Morale, MD;  [facility] INVASIVE CV LAB;  Service: Cardiovascular;  Laterality: N/A;   TEE WITHOUT CARDIOVERSION N/A 05/22/2021   Procedure: TRANSESOPHAGEAL ECHOCARDIOGRAM (TEE);  Surgeon: Corliss Skains, MD;  Location: [facility] OR;  Service: Open Heart Surgery;  Laterality: N/A;   TOE AMPUTATION  2020   Social History   Occupational History   Occupation: none    Comment: former "[employer]"  Tobacco Use   Smoking status: Former    Packs/day: 3.00    Years: 36.00    Total pack years: 108.00    Types: Cigarettes    Quit date: 08/27/2020    Years since quitting: 1.5   Smokeless tobacco: Never   Tobacco comments:    5-6 daily  Vaping Use   Vaping Use: Never used  Substance and Sexual Activity   Alcohol use: Not Currently    Comment: Very rare   Drug use: Not Currently    Types: Marijuana   Sexual activity: Not on file

## 2022-03-04 ENCOUNTER — Other Ambulatory Visit: Payer: Self-pay

## 2022-03-04 MED ORDER — AMOXICILLIN-POT CLAVULANATE 500-125 MG PO TABS
1.0000 | ORAL_TABLET | Freq: Three times a day (TID) | ORAL | 0 refills | Status: DC
  Filled 2022-03-04: qty 30, 10d supply, fill #0

## 2022-03-04 MED ORDER — SULFAMETHOXAZOLE-TRIMETHOPRIM 800-160 MG PO TABS
1.0000 | ORAL_TABLET | Freq: Two times a day (BID) | ORAL | 0 refills | Status: DC
  Filled 2022-03-04: qty 20, 10d supply, fill #0

## 2022-03-05 ENCOUNTER — Other Ambulatory Visit: Payer: Self-pay

## 2022-03-13 ENCOUNTER — Other Ambulatory Visit: Payer: Self-pay

## 2022-03-16 ENCOUNTER — Inpatient Hospital Stay (HOSPITAL_COMMUNITY): Admission: RE | Admit: 2022-03-16 | Payer: Medicaid Other | Source: Ambulatory Visit

## 2022-03-16 ENCOUNTER — Other Ambulatory Visit: Payer: Self-pay

## 2022-03-17 ENCOUNTER — Other Ambulatory Visit: Payer: Self-pay

## 2022-03-17 ENCOUNTER — Other Ambulatory Visit (HOSPITAL_COMMUNITY): Payer: Self-pay

## 2022-03-19 ENCOUNTER — Ambulatory Visit (INDEPENDENT_AMBULATORY_CARE_PROVIDER_SITE_OTHER): Payer: Self-pay | Admitting: Orthopedic Surgery

## 2022-03-19 DIAGNOSIS — S81802A Unspecified open wound, left lower leg, initial encounter: Secondary | ICD-10-CM

## 2022-03-19 DIAGNOSIS — Z89512 Acquired absence of left leg below knee: Secondary | ICD-10-CM

## 2022-03-23 ENCOUNTER — Inpatient Hospital Stay (HOSPITAL_COMMUNITY): Admission: RE | Admit: 2022-03-23 | Payer: Medicaid Other | Source: Ambulatory Visit

## 2022-03-24 ENCOUNTER — Encounter: Payer: Self-pay | Admitting: Orthopedic Surgery

## 2022-03-24 NOTE — Progress Notes (Signed)
Office Visit Note   Patient: Adam James           Date of Birth: Sep 27, 1970           MRN: [id number] Visit Date: 03/19/2022              Requested by: Hoy Register, MD [address] [place] [address] [place],  [place] [address] PCP: Hoy Register, MD  Chief Complaint  Patient presents with   Left Leg - Follow-up    08/05/21 left BKA       HPI: Patient is a 51 year old gentleman who is 7 months status post a left below-knee amputation.  Patient states he fell on the residual limb on a bolt in the bathroom.  Patient states a prescription for Augmentin was mailed to the wrong address and he will receive it today.  Currently using Bactroban and gauze and a shrinker.  Patient states he is still waiting on Medicaid.  Assessment & Plan: Visit Diagnoses:  1. Traumatic open wound of left lower leg, initial encounter   2. History of left below knee amputation (HCC)     Plan: Continue with wound care start the antibiotics.  Follow-Up Instructions: Return in about 1 week (around 03/26/2022).   Ortho Exam  Patient is alert, oriented, no adenopathy, well-dressed, normal affect, normal respiratory effort. Examination patient has a traumatic wound of the residual limb that is 10 mm in diameter.  There is no exposed bone or tendon no abscess.  Patient's most recent hemoglobin A1c is 10.1.  Imaging: No results found. No images are attached to the encounter.  Labs: Lab Results  Component Value Date   HGBA1C 10.1 (H) 11/05/2021   HGBA1C 7.9 (H) 07/30/2021   HGBA1C 6.5 (H) 05/20/2021   ESRSEDRATE 25 (H) 03/14/2021   ESRSEDRATE >140 (H) 02/25/2021   CRP 1.6 (H) 03/14/2021   REPTSTATUS 05/06/2021 FINAL 04/30/2021   GRAMSTAIN NO WBC SEEN NO ORGANISMS SEEN  04/30/2021   CULT  04/30/2021    RARE PROVIDENCIA STUARTII RARE ALCALIGENES FAECALIS RARE NORMAL SKIN FLORA CRITICAL RESULT CALLED TO, READ BACK BY AND VERIFIED WITH: RN MARY S. [id number] FCP NO ANAEROBES  ISOLATED Performed at [facility], [address], [place], [place] [address]    LABORGA PROVIDENCIA STUARTII 04/30/2021   LABORGA ALCALIGENES FAECALIS 04/30/2021     Lab Results  Component Value Date   ALBUMIN 3.9 (L) 12/08/2021   ALBUMIN 2.2 (L) 11/07/2021   ALBUMIN 2.1 (L) 11/06/2021    Lab Results  Component Value Date   MG 2.1 12/08/2021   MG 2.2 11/07/2021   MG 1.6 (L) 11/06/2021   No results found for: "VD25OH"  No results found for: "PREALBUMIN"    Latest Ref Rng & Units 12/08/2021    3:09 PM 11/06/2021    1:20 AM 11/05/2021    8:40 AM  CBC EXTENDED  WBC 3.4 - 10.8 x10E3/uL 9.8  8.6  6.5   RBC 4.14 - 5.80 x10E6/uL 4.62  3.53  3.72   Hemoglobin 13.0 - 17.7 g/dL 28.3  9.9  15.1   HCT 76.1 - 51.0 % 38.0  30.5  32.6   Platelets 150 - 450 x10E3/uL 261  246  201   NEUT# 1.4 - 7.0 x10E3/uL 5.3  4.7    Lymph# 0.7 - 3.1 x10E3/uL 3.6  3.0       There is no height or weight on file to calculate BMI.  Orders:  No orders of the defined  types were placed in this encounter.  No orders of the defined types were placed in this encounter.    Procedures: No procedures performed  Clinical Data: No additional findings.  ROS:  All other systems negative, except as noted in the HPI. Review of Systems  Objective: Vital Signs: There were no vitals taken for this visit.  Specialty Comments:  No specialty comments available.  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   Acute exacerbation of CHF (congestive heart failure) (HCC) 04/28/2021   Venous stasis ulcer of right calf limited to breakdown of skin without varicose veins (HCC)    Acute on chronic combined  systolic and diastolic CHF (congestive heart failure) (HCC)    Acute on chronic heart failure (HCC) 03/23/2021   NSTEMI (non-ST elevated myocardial infarction) (HCC)    Gastroesophageal reflux disease with esophagitis and hemorrhage    Anemia, posthemorrhagic, acute    Acute esophagitis    Gastrointestinal hemorrhage    Necrotizing fasciitis (HCC)    Diabetic polyneuropathy associated with type 2 diabetes mellitus (HCC)    Severe protein-calorie malnutrition (HCC)    Type 2 diabetes mellitus (HCC)    Heart failure (HCC)    Dyslipidemia    CAD (coronary artery disease)    Cardiomyopathy (HCC)    Coronary artery disease involving native coronary artery of native heart with angina pectoris (HCC)    Past Medical History:  Diagnosis Date   Anemia    GI bleed; 03/13/11 EGD: relux esophagitis with ulceration/clot   CAD (coronary artery disease)    Cardiomyopathy (HCC)    CHF (congestive heart failure) (HCC)    CKD (chronic kidney disease)    Diabetic foot ulcer (HCC)    Dyslipidemia    Dyspnea    Heart failure (HCC)    Hypertension    Myocardial infarction (HCC)    Type 2 diabetes mellitus (HCC)     Family History  Problem Relation Age of Onset   Hypertension Mother    Heart failure Father    Hypertension Brother    Congestive Heart Failure Brother     Past Surgical History:  Procedure Laterality Date   AMPUTATION Left 04/30/2021   Procedure: LEFT FOOT 5TH RAY AMPUTATION APPLICATION OF A-CELL POWDER;  Surgeon: [name], MD;  [facility];  Service: Orthopedics;  Laterality: Left;   AMPUTATION Left 07/30/2021   Procedure: LEFT BELOW KNEE AMPUTATION;  Surgeon: [name], MD;  Location: [facility] OR;  Service: Orthopedics;  Laterality: Left;   APPENDECTOMY     APPLICATION OF WOUND VAC Left 03/03/2021   Procedure: APPLICATION OF WOUND VAC;  Surgeon: Tarry Kos, MD;  Location: [facility] OR;  Service: Orthopedics;  Laterality: Left;   APPLICATION OF WOUND VAC  03/07/2021   Procedure:  APPLICATION OF WOUND VAC;  Surgeon: [name], MD;  Location: [facility] OR;  Service: Orthopedics;;   APPLICATION OF WOUND VAC  07/30/2021   Procedure: APPLICATION OF WOUND VAC;  Surgeon: [name], MD;  [facility];  Service: Orthopedics;;   CARDIAC CATHETERIZATION     COLON SURGERY     CORONARY ARTERY BYPASS GRAFT N/A 05/22/2021   Procedure: CORONARY ARTERY BYPASS GRAFTING (  CABG) X 3 USING LEFT INTERNAL MAMMARY ARTERY AND RIGHT ENDOSCOPIC GREATER SAPHENOUS VEIN CONDUITS;  Surgeon: Corliss Skains, MD;  Location[facility];  Service: Open Heart Surgery;  Laterality: N/A;   ENDOVEIN HARVEST OF GREATER SAPHENOUS VEIN Right 05/22/2021   Procedure: ENDOVEIN HARVEST OF GREATER SAPHENOUS VEIN;  Surgeon: Corliss Skains, MD;  [facility];  Service: Open Heart Surgery;  Laterality: Right;   ESOPHAGOGASTRODUODENOSCOPY (EGD) WITH PROPOFOL N/A 03/12/2021   Procedure: ESOPHAGOGASTRODUODENOSCOPY (EGD) WITH PROPOFOL;  Surgeon: Jenel Lucks, MD;  Location: [facility] ENDOSCOPY;  Service: Gastroenterology;  Laterality: N/A;   I & D EXTREMITY Left 02/24/2021   Procedure: IRRIGATION AND DEBRIDEMENT EXTREMITY;  Surgeon: Myrene Galas, MD;  Location: [facility] OR;  Service: Orthopedics;  Laterality: Left;   I & D EXTREMITY Left 02/26/2021   Procedure: IRRIGATION AND DEBRIDEMENT OF LEG;  Surgeon: [name], MD;  Location: [facility] OR;  Service: Orthopedics;  Laterality: Left;   I & D EXTREMITY Left 03/03/2021   Procedure: LEFT LEG IRRIGATION AND DEBRIDEMENT;  Surgeon: Tarry Kos, MD;  [facility];  Service: Orthopedics;  Laterality: Left;   I & D EXTREMITY Left 03/07/2021   Procedure: REPEAT DEBRIDEMENT LEFT LEG;  Surgeon: [name], MD;  Location: [facility] OR;  Service: Orthopedics;  Laterality: Left;   IR FLUORO GUIDE CV LINE RIGHT  03/10/2021   IR REMOVAL TUN CV CATH W/O FL  03/24/2021   IR US GUIDE VASC ACCESS RIGHT  03/10/2021   LEFT HEART CATH AND CORONARY ANGIOGRAPHY N/A 09/13/2020   Procedure: LEFT  HEART CATH AND CORONARY ANGIOGRAPHY;  Surgeon: Lennette Bihari, MD;  [facility] INVASIVE CV LAB;  Service: Cardiovascular;  Laterality: N/A;   LEFT HEART CATH AND CORONARY ANGIOGRAPHY N/A 03/28/2021   Procedure: LEFT HEART CATH AND CORONARY ANGIOGRAPHY;  Surgeon: Laurey Morale, MD;  Location: [facility] INVASIVE CV LAB;  Service: Cardiovascular;  Laterality: N/A;   RIGHT HEART CATH N/A 03/26/2021   Procedure: RIGHT HEART CATH;  Surgeon: Laurey Morale, MD;  Location: [facility] INVASIVE CV LAB;  Service: Cardiovascular;  Laterality: N/A;   TEE WITHOUT CARDIOVERSION N/A 05/22/2021   Procedure: TRANSESOPHAGEAL ECHOCARDIOGRAM (TEE);  Surgeon: Corliss Skains, MD;  Location: [facility] OR;  Service: Open Heart Surgery;  Laterality: N/A;   TOE AMPUTATION  2020   Social History   Occupational History   Occupation: none    Comment: former "[employer]"  Tobacco Use   Smoking status: Former    Packs/day: 3.00    Years: 36.00    Total pack years: 108.00    Types: Cigarettes    Quit date: 08/27/2020    Years since quitting: 1.5   Smokeless tobacco: Never   Tobacco comments:    5-6 daily  Vaping Use   Vaping Use: Never used  Substance and Sexual Activity   Alcohol use: Not Currently    Comment: Very rare   Drug use: Not Currently    Types: Marijuana   Sexual activity: Not on file

## 2022-03-26 ENCOUNTER — Encounter: Payer: Self-pay | Admitting: Orthopedic Surgery

## 2022-03-26 ENCOUNTER — Ambulatory Visit (INDEPENDENT_AMBULATORY_CARE_PROVIDER_SITE_OTHER): Payer: Self-pay | Admitting: Orthopedic Surgery

## 2022-03-26 DIAGNOSIS — Z89512 Acquired absence of left leg below knee: Secondary | ICD-10-CM

## 2022-03-26 DIAGNOSIS — S81802A Unspecified open wound, left lower leg, initial encounter: Secondary | ICD-10-CM

## 2022-03-26 NOTE — Progress Notes (Signed)
Office Visit Note   Patient: Adam James           Date of Birth: 09-01-1970           MRN: [id number] Visit Date: 03/26/2022              Requested by: Hoy Register, MD [address] [place] [address] [place],  [place] [address] PCP: Hoy Register, MD  Chief Complaint  Patient presents with   Left Leg - Follow-up    08/05/21 left BKA ulcer       HPI: Patient is a 51 year old gentleman who is almost 8 months status post left transtibial amputation.  Patient has a persistent wound.  Patient is still on Augmentin.  Assessment & Plan: Visit Diagnoses:  1. Traumatic open wound of left lower leg, initial encounter   2. History of left below knee amputation (HCC)     Plan: Continue with wound care and use his compression sleeve  Follow-Up Instructions: No follow-ups on file.   Ortho Exam  Patient is alert, oriented, no adenopathy, well-dressed, normal affect, normal respiratory effort. Examination patient has a wound over the distal tibia that is 1 cm diameter 1 mm deep with healthy granulation tissue.  Pain states he lost his compression sock.  There is no surrounding cellulitis no drainage no undermining.  Imaging: No results found. No images are attached to the encounter.  Labs: Lab Results  Component Value Date   HGBA1C 10.1 (H) 11/05/2021   HGBA1C 7.9 (H) 07/30/2021   HGBA1C 6.5 (H) 05/20/2021   ESRSEDRATE 25 (H) 03/14/2021   ESRSEDRATE >140 (H) 02/25/2021   CRP 1.6 (H) 03/14/2021   REPTSTATUS 05/06/2021 FINAL 04/30/2021   GRAMSTAIN NO WBC SEEN NO ORGANISMS SEEN  04/30/2021   CULT  04/30/2021    RARE PROVIDENCIA STUARTII RARE ALCALIGENES FAECALIS RARE NORMAL SKIN FLORA CRITICAL RESULT CALLED TO, READ BACK BY AND VERIFIED WITH: RN MARY S. [id number] FCP NO ANAEROBES ISOLATED Performed at [facility], [address] [place], [place] [address]    LABORGA PROVIDENCIA STUARTII 04/30/2021   LABORGA ALCALIGENES FAECALIS 04/30/2021     Lab  Results  Component Value Date   ALBUMIN 3.9 (L) 12/08/2021   ALBUMIN 2.2 (L) 11/07/2021   ALBUMIN 2.1 (L) 11/06/2021    Lab Results  Component Value Date   MG 2.1 12/08/2021   MG 2.2 11/07/2021   MG 1.6 (L) 11/06/2021   No results found for: "VD25OH"  No results found for: "PREALBUMIN"    Latest Ref Rng & Units 12/08/2021    3:09 PM 11/06/2021    1:20 AM 11/05/2021    8:40 AM  CBC EXTENDED  WBC 3.4 - 10.8 x10E3/uL 9.8  8.6  6.5   RBC 4.14 - 5.80 x10E6/uL 4.62  3.53  3.72   Hemoglobin 13.0 - 17.7 g/dL 13.0  9.9  86.5   HCT 78.4 - 51.0 % 38.0  30.5  32.6   Platelets 150 - 450 x10E3/uL 261  246  201   NEUT# 1.4 - 7.0 x10E3/uL 5.3  4.7    Lymph# 0.7 - 3.1 x10E3/uL 3.6  3.0       There is no height or weight on file to calculate BMI.  Orders:  No orders of the defined types were placed in this encounter.  No orders of the defined types were placed in this encounter.    Procedures: No procedures performed  Clinical Data: No additional findings.  ROS:  All  other systems negative, except as noted in the HPI. Review of Systems  Objective: Vital Signs: There were no vitals taken for this visit.  Specialty Comments:  No specialty comments available.  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   Acute exacerbation of CHF (congestive heart failure) (HCC) 04/28/2021   Venous stasis ulcer of right calf limited to breakdown of skin without varicose veins (HCC)    Acute on chronic combined systolic and diastolic CHF (congestive heart failure) (HCC)    Acute on chronic heart failure (HCC) 03/23/2021   NSTEMI (non-ST elevated myocardial infarction) (HCC)    Gastroesophageal  reflux disease with esophagitis and hemorrhage    Anemia, posthemorrhagic, acute    Acute esophagitis    Gastrointestinal hemorrhage    Necrotizing fasciitis (HCC)    Diabetic polyneuropathy associated with type 2 diabetes mellitus (HCC)    Severe protein-calorie malnutrition (HCC)    Type 2 diabetes mellitus (HCC)    Heart failure (HCC)    Dyslipidemia    CAD (coronary artery disease)    Cardiomyopathy (HCC)    Coronary artery disease involving native coronary artery of native heart with angina pectoris (HCC)    Past Medical History:  Diagnosis Date   Anemia    GI bleed; 03/13/11 EGD: relux esophagitis with ulceration/clot   CAD (coronary artery disease)    Cardiomyopathy (HCC)    CHF (congestive heart failure) (HCC)    CKD (chronic kidney disease)    Diabetic foot ulcer (HCC)    Dyslipidemia    Dyspnea    Heart failure (HCC)    Hypertension    Myocardial infarction (HCC)    Type 2 diabetes mellitus (HCC)     Family History  Problem Relation Age of Onset   Hypertension Mother    Heart failure Father    Hypertension Brother    Congestive Heart Failure Brother     Past Surgical History:  Procedure Laterality Date   AMPUTATION Left 04/30/2021   Procedure: LEFT FOOT 5TH RAY AMPUTATION APPLICATION OF A-CELL POWDER;  Surgeon: [name], MD;  Location: [facility];  Service: Orthopedics;  Laterality: Left;   AMPUTATION Left 07/30/2021   Procedure: LEFT BELOW KNEE AMPUTATION;  Surgeon: [name], MD;  Location: [facility] OR;  Service: Orthopedics;  Laterality: Left;   APPENDECTOMY     APPLICATION OF WOUND VAC Left 03/03/2021   Procedure: APPLICATION OF WOUND VAC;  Surgeon: Tarry Kos, MD;  Location: [facility];  Service: Orthopedics;  Laterality: Left;   APPLICATION OF WOUND VAC  03/07/2021   Procedure: APPLICATION OF WOUND VAC;  Surgeon: [name], MD;  Location: [facility] OR;  Service: Orthopedics;;   APPLICATION OF WOUND VAC  07/30/2021   Procedure: APPLICATION OF WOUND VAC;  Surgeon:  [name], MD;  Location: [facility];  Service: Orthopedics;;   CARDIAC CATHETERIZATION     COLON SURGERY     CORONARY ARTERY BYPASS GRAFT N/A 05/22/2021   Procedure: CORONARY ARTERY BYPASS GRAFTING (CABG) X 3 USING LEFT INTERNAL MAMMARY ARTERY AND RIGHT ENDOSCOPIC GREATER SAPHENOUS VEIN CONDUITS;  Surgeon: Corliss Skains, MD;  Location[facility];  Service: Open Heart Surgery;  Laterality: N/A;  ENDOVEIN HARVEST OF GREATER SAPHENOUS VEIN Right 05/22/2021   Procedure: ENDOVEIN HARVEST OF GREATER SAPHENOUS VEIN;  Surgeon: Corliss Skains, MD;  [facility];  Service: Open Heart Surgery;  Laterality: Right;   ESOPHAGOGASTRODUODENOSCOPY (EGD) WITH PROPOFOL N/A 03/12/2021   Procedure: ESOPHAGOGASTRODUODENOSCOPY (EGD) WITH PROPOFOL;  Surgeon: Jenel Lucks, MD;  Location: [facility] ENDOSCOPY;  Service: Gastroenterology;  Laterality: N/A;   I & D EXTREMITY Left 02/24/2021   Procedure: IRRIGATION AND DEBRIDEMENT EXTREMITY;  Surgeon: Myrene Galas, MD;  Location: [facility] OR;  Service: Orthopedics;  Laterality: Left;   I & D EXTREMITY Left 02/26/2021   Procedure: IRRIGATION AND DEBRIDEMENT OF LEG;  Surgeon: [name], MD;  Location: [facility] OR;  Service: Orthopedics;  Laterality: Left;   I & D EXTREMITY Left 03/03/2021   Procedure: LEFT LEG IRRIGATION AND DEBRIDEMENT;  Surgeon: Tarry Kos, MD;  Location[facility];  Service: Orthopedics;  Laterality: Left;   I & D EXTREMITY Left 03/07/2021   Procedure: REPEAT DEBRIDEMENT LEFT LEG;  Surgeon: [name], MD;  Location: [facility] OR;  Service: Orthopedics;  Laterality: Left;   IR FLUORO GUIDE CV LINE RIGHT  03/10/2021   IR REMOVAL TUN CV CATH W/O FL  03/24/2021   IR US GUIDE VASC ACCESS RIGHT  03/10/2021   LEFT HEART CATH AND CORONARY ANGIOGRAPHY N/A 09/13/2020   Procedure: LEFT HEART CATH AND CORONARY ANGIOGRAPHY;  Surgeon: Lennette Bihari, MD;  [facility] INVASIVE CV LAB;  Service: Cardiovascular;  Laterality: N/A;   LEFT HEART CATH AND CORONARY ANGIOGRAPHY N/A  03/28/2021   Procedure: LEFT HEART CATH AND CORONARY ANGIOGRAPHY;  Surgeon: Laurey Morale, MD;  Location: [facility] INVASIVE CV LAB;  Service: Cardiovascular;  Laterality: N/A;   RIGHT HEART CATH N/A 03/26/2021   Procedure: RIGHT HEART CATH;  Surgeon: Laurey Morale, MD;  Location: [facility] INVASIVE CV LAB;  Service: Cardiovascular;  Laterality: N/A;   TEE WITHOUT CARDIOVERSION N/A 05/22/2021   Procedure: TRANSESOPHAGEAL ECHOCARDIOGRAM (TEE);  Surgeon: Corliss Skains, MD;  Location: [facility] OR;  Service: Open Heart Surgery;  Laterality: N/A;   TOE AMPUTATION  2020   Social History   Occupational History   Occupation: none    Comment: former "[employer]"  Tobacco Use   Smoking status: Former    Packs/day: 3.00    Years: 36.00    Total pack years: 108.00    Types: Cigarettes    Quit date: 08/27/2020    Years since quitting: 1.5   Smokeless tobacco: Never   Tobacco comments:    5-6 daily  Vaping Use   Vaping Use: Never used  Substance and Sexual Activity   Alcohol use: Not Currently    Comment: Very rare   Drug use: Not Currently    Types: Marijuana   Sexual activity: Not on file

## 2022-03-31 ENCOUNTER — Other Ambulatory Visit (HOSPITAL_COMMUNITY): Payer: Self-pay

## 2022-03-31 ENCOUNTER — Ambulatory Visit: Payer: Medicaid Other | Attending: Family Medicine | Admitting: Family Medicine

## 2022-03-31 ENCOUNTER — Encounter: Payer: Self-pay | Admitting: Family Medicine

## 2022-03-31 VITALS — BP 110/73 | HR 89 | Temp 98.0°F | Ht 75.0 in

## 2022-03-31 DIAGNOSIS — Z79899 Other long term (current) drug therapy: Secondary | ICD-10-CM

## 2022-03-31 DIAGNOSIS — F32A Depression, unspecified: Secondary | ICD-10-CM | POA: Insufficient documentation

## 2022-03-31 DIAGNOSIS — I5022 Chronic systolic (congestive) heart failure: Secondary | ICD-10-CM

## 2022-03-31 DIAGNOSIS — S88112A Complete traumatic amputation at level between knee and ankle, left lower leg, initial encounter: Secondary | ICD-10-CM

## 2022-03-31 DIAGNOSIS — E1159 Type 2 diabetes mellitus with other circulatory complications: Secondary | ICD-10-CM

## 2022-03-31 DIAGNOSIS — M19041 Primary osteoarthritis, right hand: Secondary | ICD-10-CM

## 2022-03-31 DIAGNOSIS — Z951 Presence of aortocoronary bypass graft: Secondary | ICD-10-CM

## 2022-03-31 DIAGNOSIS — F3289 Other specified depressive episodes: Secondary | ICD-10-CM

## 2022-03-31 DIAGNOSIS — M19042 Primary osteoarthritis, left hand: Secondary | ICD-10-CM

## 2022-03-31 LAB — POCT GLYCOSYLATED HEMOGLOBIN (HGB A1C): HbA1c, POC (controlled diabetic range): 8.2 % — AB (ref 0.0–7.0)

## 2022-03-31 LAB — GLUCOSE, POCT (MANUAL RESULT ENTRY): POC Glucose: 150 mg/dl — AB (ref 70–99)

## 2022-03-31 MED ORDER — GLIPIZIDE 5 MG PO TABS
5.0000 mg | ORAL_TABLET | Freq: Every day | ORAL | 2 refills | Status: DC
  Filled 2022-03-31 – 2022-04-23 (×5): qty 30, 30d supply, fill #0

## 2022-03-31 MED ORDER — OZEMPIC (0.25 OR 0.5 MG/DOSE) 2 MG/3ML ~~LOC~~ SOPN
0.2500 mg | PEN_INJECTOR | SUBCUTANEOUS | 6 refills | Status: DC
  Filled 2022-03-31: qty 3, 28d supply, fill #0
  Filled 2022-04-20: qty 3, 56d supply, fill #0
  Filled 2022-06-16: qty 3, 56d supply, fill #1
  Filled 2022-08-21: qty 6, 112d supply, fill #2

## 2022-03-31 MED ORDER — VENLAFAXINE HCL ER 75 MG PO CP24
75.0000 mg | ORAL_CAPSULE | Freq: Every day | ORAL | 2 refills | Status: DC
  Filled 2022-03-31 – 2022-04-23 (×5): qty 30, 30d supply, fill #0
  Filled 2022-06-15: qty 30, 30d supply, fill #1

## 2022-03-31 MED ORDER — DICLOFENAC SODIUM 1 % EX GEL
4.0000 g | Freq: Four times a day (QID) | CUTANEOUS | 1 refills | Status: DC
  Filled 2022-03-31: qty 100, 20d supply, fill #0

## 2022-03-31 NOTE — Progress Notes (Signed)
Has trouble gripping things with hands Knot on inner left hand.

## 2022-03-31 NOTE — Patient Instructions (Signed)
Semaglutide Injection What is this medication? SEMAGLUTIDE (SEM a GLOO tide) treats type 2 diabetes. It works by increasing insulin levels in your body, which decreases your blood sugar (glucose). It also reduces the amount of sugar released into the blood and slows down your digestion. It can also be used to lower the risk of heart attack and stroke in people with type 2 diabetes. Changes to diet and exercise are often combined with this medication. This medicine may be used for other purposes; ask your health care provider or pharmacist if you have questions. COMMON BRAND NAME(S): OZEMPIC What should I tell my care team before I take this medication? They need to know if you have any of these conditions: Endocrine tumors (MEN 2) or if someone in your family had these tumors Eye disease, vision problems History of pancreatitis Kidney disease Stomach problems Thyroid cancer or if someone in your family had thyroid cancer An unusual or allergic reaction to semaglutide, other medications, foods, dyes, or preservatives Pregnant or trying to get pregnant Breast-feeding How should I use this medication? This medication is for injection under the skin of your upper leg (thigh), stomach area, or upper arm. It is given once every week (every 7 days). You will be taught how to prepare and give this medication. Use exactly as directed. Take your medication at regular intervals. Do not take it more often than directed. If you use this medication with insulin, you should inject this medication and the insulin separately. Do not mix them together. Do not give the injections right next to each other. Change (rotate) injection sites with each injection. It is important that you put your used needles and syringes in a special sharps container. Do not put them in a trash can. If you do not have a sharps container, call your pharmacist or care team to get one. A special MedGuide will be given to you by the  pharmacist with each prescription and refill. Be sure to read this information carefully each time. This medication comes with INSTRUCTIONS FOR USE. Ask your pharmacist for directions on how to use this medication. Read the information carefully. Talk to your pharmacist or care team if you have questions. Talk to your care team about the use of this medication in children. Special care may be needed. Overdosage: If you think you have taken too much of this medicine contact a poison control center or emergency room at once. NOTE: This medicine is only for you. Do not share this medicine with others. What if I miss a dose? If you miss a dose, take it as soon as you can within 5 days after the missed dose. Then take your next dose at your regular weekly time. If it has been longer than 5 days after the missed dose, do not take the missed dose. Take the next dose at your regular time. Do not take double or extra doses. If you have questions about a missed dose, contact your care team for advice. What may interact with this medication? Other medications for diabetes Many medications may cause changes in blood sugar, these include: Alcohol containing beverages Antiviral medications for HIV or AIDS Aspirin and aspirin-like medications Certain medications for blood pressure, heart disease, irregular heart beat Chromium Diuretics Male hormones, such as estrogens or progestins, birth control pills Fenofibrate Gemfibrozil Isoniazid Lanreotide Male hormones or anabolic steroids MAOIs like Carbex, Eldepryl, Marplan, Nardil, and Parnate Medications for weight loss Medications for allergies, asthma, cold, or cough Medications for depression,   anxiety, or psychotic disturbances Niacin Nicotine NSAIDs, medications for pain and inflammation, like ibuprofen or naproxen Octreotide Pasireotide Pentamidine Phenytoin Probenecid Quinolone antibiotics such as ciprofloxacin, levofloxacin, ofloxacin Some  herbal dietary supplements Steroid medications such as prednisone or cortisone Sulfamethoxazole; trimethoprim Thyroid hormones Some medications can hide the warning symptoms of low blood sugar (hypoglycemia). You may need to monitor your blood sugar more closely if you are taking one of these medications. These include: Beta-blockers, often used for high blood pressure or heart problems (examples include atenolol, metoprolol, propranolol) Clonidine Guanethidine Reserpine This list may not describe all possible interactions. Give your health care provider a list of all the medicines, herbs, non-prescription drugs, or dietary supplements you use. Also tell them if you smoke, drink alcohol, or use illegal drugs. Some items may interact with your medicine. What should I watch for while using this medication? Visit your care team for regular checks on your progress. Drink plenty of fluids while taking this medication. Check with your care team if you get an attack of severe diarrhea, nausea, and vomiting. The loss of too much body fluid can make it dangerous for you to take this medication. A test called the HbA1C (A1C) will be monitored. This is a simple blood test. It measures your blood sugar control over the last 2 to 3 months. You will receive this test every 3 to 6 months. Learn how to check your blood sugar. Learn the symptoms of low and high blood sugar and how to manage them. Always carry a quick-source of sugar with you in case you have symptoms of low blood sugar. Examples include hard sugar candy or glucose tablets. Make sure others know that you can choke if you eat or drink when you develop serious symptoms of low blood sugar, such as seizures or unconsciousness. They must get medical help at once. Tell your care team if you have high blood sugar. You might need to change the dose of your medication. If you are sick or exercising more than usual, you might need to change the dose of your  medication. Do not skip meals. Ask your care team if you should avoid alcohol. Many nonprescription cough and cold products contain sugar or alcohol. These can affect blood sugar. Pens should never be shared. Even if the needle is changed, sharing may result in passing of viruses like hepatitis or HIV. Wear a medical ID bracelet or chain, and carry a card that describes your disease and details of your medication and dosage times. Do not become pregnant while taking this medication. Women should inform their care team if they wish to become pregnant or think they might be pregnant. There is a potential for serious side effects to an unborn child. Talk to your care team for more information. What side effects may I notice from receiving this medication? Side effects that you should report to your care team as soon as possible: Allergic reactions--skin rash, itching, hives, swelling of the face, lips, tongue, or throat Change in vision Dehydration--increased thirst, dry mouth, feeling faint or lightheaded, headache, dark yellow or brown urine Gallbladder problems--severe stomach pain, nausea, vomiting, fever Heart palpitations--rapid, pounding, or irregular heartbeat Kidney injury--decrease in the amount of urine, swelling of the ankles, hands, or feet Pancreatitis--severe stomach pain that spreads to your back or gets worse after eating or when touched, fever, nausea, vomiting Thyroid cancer--new mass or lump in the neck, pain or trouble swallowing, trouble breathing, hoarseness Side effects that usually do not require medical   attention (report to your care team if they continue or are bothersome): Diarrhea Loss of appetite Nausea Stomach pain Vomiting This list may not describe all possible side effects. Call your doctor for medical advice about side effects. You may report side effects to FDA at 1-800-FDA-1088. Where should I keep my medication? Keep out of the reach of children. Store  unopened pens in a refrigerator between 2 and 8 degrees C (36 and 46 degrees F). Do not freeze. Protect from light and heat. After you first use the pen, it can be stored for 56 days at room temperature between 15 and 30 degrees C (59 and 86 degrees F) or in a refrigerator. Throw away your used pen after 56 days or after the expiration date, whichever comes first. Do not store your pen with the needle attached. If the needle is left on, medication may leak from the pen. NOTE: This sheet is a summary. It may not cover all possible information. If you have questions about this medicine, talk to your doctor, pharmacist, or health care provider.  2023 Elsevier/Gold Standard (2020-09-26 00:00:00)  

## 2022-03-31 NOTE — Progress Notes (Signed)
Subjective:  Patient ID: Adam James, male    DOB: Sep 08, 1970  Age: 51 y.o. MRN: [id number]  CC: Diabetes   HPI Adam James is a 51 y.o. year old male with a history of HFrEF (EF 20-25% from echo 10/2021), CABG (in 04/2022), SVT, type 2 diabetes mellitus (A1c 10.1), stage III CKD, left BKA (in 07/2021), depression. He is accompanied by his daughter.  Interval History: A1c is 8.2 down from 10.1 previously He takes Glipizide daily because he has had some hypoglycemia in the past when he took it twice daily.  He had a cardiology visit at the CHF clinic 6 weeks ago and Coreg was initiated, amiodarone dose decreased.  Notes reviewed indicate need for ICD will be determined after his echocardiogram.  Seen by Dr. Sharol Given last week as he does have an ulcer on his stump after he fell and injured it.  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 His depression is controlled on Effexor. He complains of difficulty with his grip in both hands and he has had difficulty chopping with a knife or holding things but he has no pain.  He is right-hand dominant. He is currently working on obtaining disability and has been denied once but had to appeal. Past Medical History:  Diagnosis Date   Anemia    GI bleed; 03/13/11 EGD: relux esophagitis with ulceration/clot   CAD (coronary artery disease)    Cardiomyopathy (HCC)    CHF (congestive heart failure) (HCC)    CKD (chronic kidney disease)    Diabetic foot ulcer (HCC)    Dyslipidemia    Dyspnea    Heart failure ([place])    Hypertension    Myocardial infarction ([place])    Type 2 diabetes mellitus ([place])     Past Surgical History:  Procedure Laterality Date   AMPUTATION Left 04/30/2021   Procedure: LEFT FOOT 5TH RAY AMPUTATION APPLICATION OF A-CELL POWDER;  Surgeon: Newt Minion, MD;  Location: [place];  Service: Orthopedics;  Laterality: Left;   AMPUTATION Left 07/30/2021   Procedure: LEFT BELOW KNEE AMPUTATION;  Surgeon: Newt Minion, MD;   Location: [place];  Service: Orthopedics;  Laterality: Left;   APPENDECTOMY     APPLICATION OF WOUND VAC Left 03/03/2021   Procedure: APPLICATION OF WOUND VAC;  Surgeon: Leandrew Koyanagi, MD;  Location: [place];  Service: Orthopedics;  Laterality: Left;   APPLICATION OF WOUND VAC  03/07/2021   Procedure: APPLICATION OF WOUND VAC;  Surgeon: Newt Minion, MD;  Location: [place];  Service: Orthopedics;;   APPLICATION OF WOUND VAC  07/30/2021   Procedure: APPLICATION OF WOUND VAC;  Surgeon: Newt Minion, MD;  Location: [place];  Service: Orthopedics;;   CARDIAC CATHETERIZATION     COLON SURGERY     CORONARY ARTERY BYPASS GRAFT N/A 05/22/2021   Procedure: CORONARY ARTERY BYPASS GRAFTING (CABG) X 3 USING LEFT INTERNAL MAMMARY ARTERY AND RIGHT ENDOSCOPIC GREATER SAPHENOUS VEIN CONDUITS;  Surgeon: Lajuana Matte, MD;  Location: [place];  Service: Open Heart Surgery;  Laterality: N/A;   ENDOVEIN HARVEST OF GREATER SAPHENOUS VEIN Right 05/22/2021   Procedure: ENDOVEIN HARVEST OF GREATER SAPHENOUS VEIN;  Surgeon: Lajuana Matte, MD;  Location: [place];  Service: Open Heart Surgery;  Laterality: Right;   ESOPHAGOGASTRODUODENOSCOPY (EGD) WITH PROPOFOL N/A 03/12/2021   Procedure: ESOPHAGOGASTRODUODENOSCOPY (EGD) WITH PROPOFOL;  Surgeon: Daryel November, MD;  Location: [place];  Service: Gastroenterology;  Laterality: N/A;   I & D EXTREMITY Left  02/24/2021   Procedure: IRRIGATION AND DEBRIDEMENT EXTREMITY;  Surgeon: Altamese Foster, MD;  Location: [place];  Service: Orthopedics;  Laterality: Left;   I & D EXTREMITY Left 02/26/2021   Procedure: IRRIGATION AND DEBRIDEMENT OF LEG;  Surgeon: Newt Minion, MD;  Location: [place];  Service: Orthopedics;  Laterality: Left;   I & D EXTREMITY Left 03/03/2021   Procedure: LEFT LEG IRRIGATION AND DEBRIDEMENT;  Surgeon: Leandrew Koyanagi, MD;  Location: [place];  Service: Orthopedics;  Laterality: Left;   I & D EXTREMITY Left 03/07/2021   Procedure: REPEAT DEBRIDEMENT LEFT LEG;   Surgeon: Newt Minion, MD;  Location: [place];  Service: Orthopedics;  Laterality: Left;   IR FLUORO GUIDE CV LINE RIGHT  03/10/2021   IR REMOVAL TUN CV CATH W/O FL  03/24/2021   IR US GUIDE VASC ACCESS RIGHT  03/10/2021   LEFT HEART CATH AND CORONARY ANGIOGRAPHY N/A 09/13/2020   Procedure: LEFT HEART CATH AND CORONARY ANGIOGRAPHY;  Surgeon: Troy Sine, MD;  Location: [place] [facility];  Service: Cardiovascular;  Laterality: N/A;   LEFT HEART CATH AND CORONARY ANGIOGRAPHY N/A 03/28/2021   Procedure: LEFT HEART CATH AND CORONARY ANGIOGRAPHY;  Surgeon: Larey Dresser, MD;  Location: [place] [facility];  Service: Cardiovascular;  Laterality: N/A;   RIGHT HEART CATH N/A 03/26/2021   Procedure: RIGHT HEART CATH;  Surgeon: Larey Dresser, MD;  Location: [place] [facility];  Service: Cardiovascular;  Laterality: N/A;   TEE WITHOUT CARDIOVERSION N/A 05/22/2021   Procedure: TRANSESOPHAGEAL ECHOCARDIOGRAM (TEE);  Surgeon: Lajuana Matte, MD;  Location: [place];  Service: Open Heart Surgery;  Laterality: N/A;   TOE AMPUTATION  2020    Family History  Problem Relation Age of Onset   Hypertension Mother    Heart failure Father    Hypertension Brother    Congestive Heart Failure Brother     Social History   Socioeconomic History   Marital status: Single    Spouse name: Not on file   Number of children: 1   Years of education: Not on file   Highest education level: 8th grade  Occupational History   Occupation: none    Comment: former "[employer]"  Tobacco Use   Smoking status: Former    Packs/day: 3.00    Years: 36.00    Total pack years: 108.00    Types: Cigarettes    Quit date: 08/27/2020    Years since quitting: 1.5   Smokeless tobacco: Never   Tobacco comments:    5-6 daily  Vaping Use   Vaping Use: Never used  Substance and Sexual Activity   Alcohol use: Not Currently    Comment: Very rare   Drug use: Not Currently    Types: Marijuana   Sexual activity: Not on file  Other  Topics Concern   Not on file  Social History Narrative   Not on file   Social Determinants of Health   Financial Resource Strain: High Risk (02/20/2022)   Overall Financial Resource Strain (CARDIA)    Difficulty of Paying Living Expenses: Hard  Food Insecurity: Food Insecurity Present (02/04/2022)   Hunger Vital Sign    Worried About Running Out of Food in the Last Year: Sometimes true    Ran Out of Food in the Last Year: Sometimes true  Transportation Needs: No Transportation Needs (11/06/2021)   PRAPARE - Hydrologist (Medical): No    Lack of Transportation (Non-Medical): No  Physical Activity: Not on file  Stress: Not on file  Social Connections: Not on file    Allergies  Allergen Reactions   Morphine Itching    Outpatient Medications Prior to Visit  Medication Sig Dispense Refill   albuterol (VENTOLIN HFA) 108 (90 Base) MCG/ACT inhaler Inhale 2 puffs into the lungs every 4 (four) hours as needed for shortness of breath or wheezing.     amiodarone (PACERONE) 200 MG tablet Take 0.5 tablets (100 mg total) by mouth daily. 30 tablet 3   aspirin EC 81 MG tablet Take 1 tablet (81 mg total) by mouth daily. 30 tablet 11   atorvastatin (LIPITOR) 80 MG tablet Take 1 tablet (80 mg total) by mouth daily. 30 tablet 3   carvedilol (COREG) 3.125 MG tablet Take 1 tablet (3.125 mg total) by mouth 2 (two) times daily. 180 tablet 3   digoxin (LANOXIN) 0.125 MG tablet Take 1 tablet (0.125 mg total) by mouth daily. 30 tablet 3   empagliflozin (JARDIANCE) 10 MG TABS tablet Take 1 tablet (10 mg total) by mouth daily. 30 tablet 3   furosemide (LASIX) 40 MG tablet Take 1 tablet (40 mg total) by mouth daily. 30 tablet 3   gabapentin (NEURONTIN) 300 MG capsule Take 1 capsule (300 mg total) by mouth at bedtime. 90 capsule 1   glucose blood (TRUE METRIX BLOOD GLUCOSE TEST) test strip Use as instructed twice daily 100 each 12   TRUEplus Lancets 28G MISC Use as directed twice  daily at 8 am and 10 pm. 100 each 11   glipiZIDE (GLUCOTROL) 5 MG tablet Take 1 tablet (5 mg total) by mouth 2 (two) times daily before a meal. 60 tablet 2   venlafaxine XR (EFFEXOR-XR) 75 MG 24 hr capsule Take 1 capsule (75 mg total) by mouth daily with breakfast. 30 capsule 2   amoxicillin-clavulanate (AUGMENTIN) 500-125 MG tablet Take 1 tablet (500 mg total) by mouth 3 (three) times daily. (Patient not taking: Reported on 03/31/2022) 30 tablet 0   No facility-administered medications prior to visit.     ROS Review of Systems  Constitutional:  Negative for activity change and appetite change.  HENT:  Negative for sinus pressure and sore throat.   Respiratory:  Negative for chest tightness, shortness of breath and wheezing.   Cardiovascular:  Negative for chest pain and palpitations.  Gastrointestinal:  Negative for abdominal distention, abdominal pain and constipation.  Genitourinary: Negative.   Musculoskeletal:        See HPI  Psychiatric/Behavioral:  Negative for behavioral problems and dysphoric mood.     Objective:  BP 110/73   Pulse 89   Temp 98 F (36.7 C) (Oral)   Ht 6' 3" (1.905 m)   SpO2 99%   BMI 26.92 kg/m      03/31/2022    3:31 PM 02/23/2022    2:28 PM 02/10/2022   10:15 AM  BP/Weight  Systolic BP [phone]  Diastolic BP 73 72 275      Physical Exam Constitutional:      Appearance: He is well-developed.  Cardiovascular:     Rate and Rhythm: Normal rate.     Heart sounds: Normal heart sounds. No murmur heard. Pulmonary:     Effort: Pulmonary effort is normal.     Breath sounds: Normal breath sounds. No wheezing or rales.  Chest:     Chest wall: No tenderness.  Abdominal:     General: Bowel sounds are normal. There is no distension.  Palpations: Abdomen is soft. There is no mass.     Tenderness: There is no abdominal tenderness.  Musculoskeletal:     Right lower leg: No edema.     Comments: Left BKA   Able to make a fist bilaterally   Neurological:     Mental Status: He is alert and oriented to person, place, and time.  Psychiatric:        Mood and Affect: Mood normal.        Latest Ref Rng & Units 12/24/2021    2:48 PM 12/08/2021    3:09 PM 11/19/2021    3:05 PM  CMP  Glucose 70 - 99 mg/dL 227  115  173   BUN 6 - 20 mg/dL 34  17  63   Creatinine 0.61 - 1.24 mg/dL 1.34  1.60  2.08   Sodium 135 - 145 mmol/L 136  139  136   Potassium 3.5 - 5.1 mmol/L 5.3  4.7  4.9   Chloride 98 - 111 mmol/L 106  102  106   CO2 22 - 32 mmol/L _0 Calcium 8.9 - 10.3 mg/dL 8.8  9.1  9.3   Total Protein 6.0 - 8.5 g/dL  6.7    Total Bilirubin 0.0 - 1.2 mg/dL  0.2    Alkaline Phos 44 - 121 IU/L  91    AST 0 - 40 IU/L  13    ALT 0 - 44 IU/L  17      Lipid Panel     Component Value Date/Time   CHOL 171 11/06/2021 0120   CHOL 167 10/07/2020 1004   TRIG 100 11/06/2021 0120   HDL 39 (L) 11/06/2021 0120   HDL 36 (L) 10/07/2020 1004   CHOLHDL 4.4 11/06/2021 0120   VLDL 20 11/06/2021 0120   LDLCALC 112 (H) 11/06/2021 0120   LDLCALC 108 (H) 10/07/2020 1004    CBC    Component Value Date/Time   WBC 9.8 12/08/2021 1509   WBC 8.6 11/06/2021 0120   RBC 4.62 12/08/2021 1509   RBC 3.53 (L) 11/06/2021 0120   HGB 13.2 12/08/2021 1509   HCT 38.0 12/08/2021 1509   PLT 261 12/08/2021 1509   MCV 82 12/08/2021 1509   MCH 28.6 12/08/2021 1509   MCH 28.0 11/06/2021 0120   MCHC 34.7 12/08/2021 1509   MCHC 32.5 11/06/2021 0120   RDW 14.2 12/08/2021 1509   LYMPHSABS 3.6 (H) 12/08/2021 1509   MONOABS 0.5 11/06/2021 0120   EOSABS 0.2 12/08/2021 1509   BASOSABS 0.1 12/08/2021 1509    Lab Results  Component Value Date   HGBA1C 8.2 (A) 03/31/2022    Lab Results  Component Value Date   TSH 3.942 11/06/2021    Assessment & Plan:  1. Type 2 diabetes mellitus with other circulatory complication, without long-term current use of insulin (HCC) Uncontrolled with A1c of 8.2 which has improved from 10.1 previously He will  remain on glipizide daily rather than twice daily to prevent hypoglycemia, continue Jardiance GLP-1 RA added to regimen after shared decision making due to cardiovascular and weight loss benefit Counseled on Diabetic diet, my plate method, 810 minutes of moderate intensity exercise/week Blood sugar logs with fasting goals of 80-120 mg/dl, random of less than 180 and in the event of sugars less than 60 mg/dl or greater than 400 mg/dl encouraged to notify the clinic. Advised on the need for annual eye exams, annual foot exams, Pneumonia vaccine. - POCT glucose (  manual entry) - POCT glycosylated hemoglobin (Hb A1C) - CMP14+EGFR - Ambulatory referral to Ophthalmology - Microalbumin/Creatinine Ratio, Urine - Semaglutide,0.25 or 0.5MG/DOS, (OZEMPIC, 0.25 OR 0.5 MG/DOSE,) 2 MG/3ML SOPN; Inject 0.25 mg into the skin once a week.  Dispense: 3 mL; Refill: 6 - glipiZIDE (GLUCOTROL) 5 MG tablet; Take 1 tablet (5 mg total) by mouth daily before breakfast.  Dispense: 30 tablet; Refill: 2  2. Osteoarthritis of both hands, unspecified osteoarthritis type Could explain hand symptoms - diclofenac Sodium (VOLTAREN) 1 % GEL; Apply 4 g topically 4 (four) times daily.  Dispense: 100 g; Refill: 1  3. High risk medication use Due to amiodarone use will need to check thyroid labs Consider PFT at next visit - T3, free - T4, free - TSH  4. Chronic systolic heart failure (HCC) EF of 20 to 25% from echo of 10/2021 Euvolemic Continue GDMT He is due for repeat echo after which need for ICD would to be determined Follow-up with CHF clinic  5. 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 Other depression Controlled - venlafaxine XR (EFFEXOR-XR) 75 MG 24 hr capsule; Take 1 capsule (75 mg total) by mouth daily with breakfast.  Dispense: 30 capsule; Refill: 2  7. S/P CABG x 3 Risk factor modification    Meds ordered this encounter  Medications    diclofenac Sodium (VOLTAREN) 1 % GEL    Sig: Apply 4 g topically 4 (four) times daily.    Dispense:  100 g    Refill:  1   Semaglutide,0.25 or 0.5MG/DOS, (OZEMPIC, 0.25 OR 0.5 MG/DOSE,) 2 MG/3ML SOPN    Sig: Inject 0.25 mg into the skin once a week.    Dispense:  3 mL    Refill:  6   glipiZIDE (GLUCOTROL) 5 MG tablet    Sig: Take 1 tablet (5 mg total) by mouth daily before breakfast.    Dispense:  30 tablet    Refill:  2   venlafaxine XR (EFFEXOR-XR) 75 MG 24 hr capsule    Sig: Take 1 capsule (75 mg total) by mouth daily with breakfast.    Dispense:  30 capsule    Refill:  2    Follow-up: Return in about 3 months (around 06/30/2022) for Chronic medical conditions.       Charlott Rakes, MD, FAAFP. [facility] and [facility], [place]   03/31/2022, 4:39 PM

## 2022-04-01 LAB — CMP14+EGFR
ALT: 19 IU/L (ref 0–44)
AST: 17 IU/L (ref 0–40)
Albumin/Globulin Ratio: 1.5 (ref 1.2–2.2)
Albumin: 4.3 g/dL (ref 4.1–5.1)
Alkaline Phosphatase: 89 IU/L (ref 44–121)
BUN/Creatinine Ratio: 19 (ref 9–20)
BUN: 38 mg/dL — ABNORMAL HIGH (ref 6–24)
Bilirubin Total: 0.3 mg/dL (ref 0.0–1.2)
CO2: 22 mmol/L (ref 20–29)
Calcium: 9.9 mg/dL (ref 8.7–10.2)
Chloride: 99 mmol/L (ref 96–106)
Creatinine, Ser: 1.96 mg/dL — ABNORMAL HIGH (ref 0.76–1.27)
Globulin, Total: 2.8 g/dL (ref 1.5–4.5)
Glucose: 177 mg/dL — ABNORMAL HIGH (ref 70–99)
Potassium: 5.3 mmol/L — ABNORMAL HIGH (ref 3.5–5.2)
Sodium: 139 mmol/L (ref 134–144)
Total Protein: 7.1 g/dL (ref 6.0–8.5)
eGFR: 41 mL/min/{1.73_m2} — ABNORMAL LOW (ref >59)

## 2022-04-01 LAB — MICROALBUMIN / CREATININE URINE RATIO
Creatinine, Urine: 72.2 mg/dL
Microalb/Creat Ratio: 1351 mg/g creat — ABNORMAL HIGH (ref 0–29)
Microalbumin, Urine: 975.5 ug/mL

## 2022-04-01 LAB — TSH: TSH: 2.16 u[IU]/mL (ref 0.450–4.500)

## 2022-04-01 LAB — T4, FREE: Free T4: 1.39 ng/dL (ref 0.82–1.77)

## 2022-04-01 LAB — T3, FREE: T3, Free: 2.2 pg/mL (ref 2.0–4.4)

## 2022-04-02 ENCOUNTER — Other Ambulatory Visit: Payer: Self-pay | Admitting: Family Medicine

## 2022-04-02 DIAGNOSIS — N1832 Chronic kidney disease, stage 3b: Secondary | ICD-10-CM

## 2022-04-20 ENCOUNTER — Other Ambulatory Visit (HOSPITAL_COMMUNITY): Payer: Self-pay

## 2022-04-20 ENCOUNTER — Other Ambulatory Visit: Payer: Self-pay

## 2022-04-21 ENCOUNTER — Other Ambulatory Visit: Payer: Self-pay

## 2022-04-23 ENCOUNTER — Other Ambulatory Visit (HOSPITAL_COMMUNITY): Payer: Self-pay

## 2022-04-23 ENCOUNTER — Other Ambulatory Visit: Payer: Self-pay

## 2022-04-24 ENCOUNTER — Other Ambulatory Visit (HOSPITAL_COMMUNITY): Payer: Self-pay

## 2022-04-28 ENCOUNTER — Other Ambulatory Visit: Payer: Self-pay

## 2022-04-28 ENCOUNTER — Ambulatory Visit (HOSPITAL_BASED_OUTPATIENT_CLINIC_OR_DEPARTMENT_OTHER)
Admission: RE | Admit: 2022-04-28 | Discharge: 2022-04-28 | Disposition: A | Discharge status: Home / Self Care | Payer: Medicaid Other | Source: Ambulatory Visit | Attending: Cardiology | Admitting: Cardiology

## 2022-04-28 ENCOUNTER — Telehealth (HOSPITAL_COMMUNITY): Payer: Self-pay

## 2022-04-28 ENCOUNTER — Ambulatory Visit: Payer: Medicaid Other | Admitting: Family

## 2022-04-28 ENCOUNTER — Ambulatory Visit (HOSPITAL_COMMUNITY)
Admission: RE | Admit: 2022-04-28 | Discharge: 2022-04-28 | Disposition: A | Discharge status: Home / Self Care | Payer: Medicaid Other | Source: Ambulatory Visit | Attending: Family Medicine | Admitting: Family Medicine

## 2022-04-28 ENCOUNTER — Other Ambulatory Visit (HOSPITAL_COMMUNITY): Payer: Self-pay

## 2022-04-28 VITALS — BP 130/80 | HR 88

## 2022-04-28 DIAGNOSIS — I2510 Atherosclerotic heart disease of native coronary artery without angina pectoris: Secondary | ICD-10-CM | POA: Insufficient documentation

## 2022-04-28 DIAGNOSIS — I5022 Chronic systolic (congestive) heart failure: Secondary | ICD-10-CM | POA: Insufficient documentation

## 2022-04-28 DIAGNOSIS — Z7984 Long term (current) use of oral hypoglycemic drugs: Secondary | ICD-10-CM | POA: Insufficient documentation

## 2022-04-28 DIAGNOSIS — E785 Hyperlipidemia, unspecified: Secondary | ICD-10-CM

## 2022-04-28 DIAGNOSIS — Z7982 Long term (current) use of aspirin: Secondary | ICD-10-CM | POA: Diagnosis not present

## 2022-04-28 DIAGNOSIS — Z951 Presence of aortocoronary bypass graft: Secondary | ICD-10-CM | POA: Insufficient documentation

## 2022-04-28 DIAGNOSIS — I130 Hypertensive heart and chronic kidney disease with heart failure and stage 1 through stage 4 chronic kidney disease, or unspecified chronic kidney disease: Secondary | ICD-10-CM | POA: Insufficient documentation

## 2022-04-28 DIAGNOSIS — E1122 Type 2 diabetes mellitus with diabetic chronic kidney disease: Secondary | ICD-10-CM | POA: Insufficient documentation

## 2022-04-28 DIAGNOSIS — I351 Nonrheumatic aortic (valve) insufficiency: Secondary | ICD-10-CM | POA: Diagnosis not present

## 2022-04-28 DIAGNOSIS — I255 Ischemic cardiomyopathy: Secondary | ICD-10-CM | POA: Insufficient documentation

## 2022-04-28 DIAGNOSIS — Z89512 Acquired absence of left leg below knee: Secondary | ICD-10-CM | POA: Diagnosis not present

## 2022-04-28 DIAGNOSIS — I272 Pulmonary hypertension, unspecified: Secondary | ICD-10-CM | POA: Insufficient documentation

## 2022-04-28 DIAGNOSIS — Z79899 Other long term (current) drug therapy: Secondary | ICD-10-CM | POA: Diagnosis not present

## 2022-04-28 DIAGNOSIS — I493 Ventricular premature depolarization: Secondary | ICD-10-CM | POA: Diagnosis not present

## 2022-04-28 LAB — COMPREHENSIVE METABOLIC PANEL
ALT: 17 U/L (ref 0–44)
AST: 17 U/L (ref 15–41)
Albumin: 3.5 g/dL (ref 3.5–5.0)
Alkaline Phosphatase: 90 U/L (ref 38–126)
Anion gap: 10 (ref 5–15)
BUN: 32 mg/dL — ABNORMAL HIGH (ref 6–20)
CO2: 23 mmol/L (ref 22–32)
Calcium: 9.4 mg/dL (ref 8.9–10.3)
Chloride: 108 mmol/L (ref 98–111)
Creatinine, Ser: 1.97 mg/dL — ABNORMAL HIGH (ref 0.61–1.24)
GFR, Estimated: 41 mL/min — ABNORMAL LOW (ref >60)
Glucose, Bld: 120 mg/dL — ABNORMAL HIGH (ref 70–99)
Potassium: 4.4 mmol/L (ref 3.5–5.1)
Sodium: 141 mmol/L (ref 135–145)
Total Bilirubin: 0.5 mg/dL (ref 0.3–1.2)
Total Protein: 7 g/dL (ref 6.5–8.1)

## 2022-04-28 LAB — ECHOCARDIOGRAM COMPLETE
Calc EF: 45 %
S' Lateral: 5 cm
Single Plane A2C EF: 48.7 %
Single Plane A4C EF: 41.1 %

## 2022-04-28 LAB — LIPID PANEL
Cholesterol: 167 mg/dL (ref 0–200)
HDL: 36 mg/dL — ABNORMAL LOW (ref >40)
LDL Cholesterol: 99 mg/dL (ref 0–99)
Total CHOL/HDL Ratio: 4.6 RATIO
Triglycerides: 159 mg/dL — ABNORMAL HIGH (ref <150)
VLDL: 32 mg/dL (ref 0–40)

## 2022-04-28 LAB — DIGOXIN LEVEL: Digoxin Level: <0.2 ng/mL — ABNORMAL LOW (ref 0.8–2.0)

## 2022-04-28 LAB — BRAIN NATRIURETIC PEPTIDE: B Natriuretic Peptide: 76.3 pg/mL (ref 0.0–100.0)

## 2022-04-28 MED ORDER — ENTRESTO 24-26 MG PO TABS: 1.0000 | ORAL_TABLET | Freq: Two times a day (BID) | ORAL | 3 refills | Status: DC

## 2022-04-28 MED ORDER — FUROSEMIDE 40 MG PO TABS
20.0000 mg | ORAL_TABLET | Freq: Every day | ORAL | 3 refills | Status: DC
  Filled 2022-04-28 – 2022-04-29 (×2): qty 30, 60d supply, fill #0
  Filled 2022-06-15: qty 30, 60d supply, fill #1

## 2022-04-28 MED ORDER — ENTRESTO 24-26 MG PO TABS: 1.0000 | ORAL_TABLET | Freq: Two times a day (BID) | ORAL | 11 refills | Status: DC

## 2022-04-28 NOTE — Progress Notes (Signed)
Advanced Heart Failure Clinic Note    PCP: Charlott Rakes, MD PCP-Cardiologist: Shirlee More, MD  HF Cardiologist: Dr. Aundra Dubin  HPI: [name] is a 51 y.o.with a history CAD, Had PCI 2007 in [place]. 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   Admitted to [facility] in February 2022 with lower extremity edema and foot ulcer. BNP and HS Trop elevated and felt to be non-ACS.  Cardiology consulted and he was placed on lasix.    He had follow up with Dr Bettina Gavia 09/09/20 and was set up for cath. Cath with multivessel CAD. Saw Dr Kipp Brood in the office with plans for CMRI. He was referred to SW due to financial concerns and no payor source. CMRI showed EF 40% and viable myocardium.     Saw Dr Kipp Brood in July 2022 and was set up for CABG in August. Pre CABG labs completed and showed WBC 25. He was advised to go to the ED.    Admitted 02/25/2021 with left foot infection complicated by osteomyelitis and necrotizing fasciitis. 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 Hospital course complicated by erosive esophagitis on EGD. Recommendations for protonix 40 mg bid + repeat EGD in 8 weeks. Received multiple transfusions. Discharged 03/15/21.    Admitted 03/23/21 with A/C CHF exacerbation. Started on IV lasix. Cardiology consulted. CO-OX 55%. Given 80 mg IV x1. V lasix increased to 100 mg tid and started on metolazone.  Repeat echo on 03/23/21 with LVEF of 25%, LV demonstrating global hypokinesis, and LV diastolic parameters consistent with grade II diastolic dysfunction. RHC performed 03/26/21 showed preserved cardiac output, mildly elevated PCWP, and mild pulmonary venous hypertension. LHC performed on 03/28/21 showed  minimal change from prior study in 2/22. Ost LAD to Mid LAD 90% stenosed, mid LAD to Dist LAD 90% stenosed, dist LAD lesion 100%  stenosed.prox Cx 70% stenosed, prox RCA to Mid RCA 100% stenosed, dist RCA 100% stenosed, ramus 70% stenosed, 1st Mrg 50% stenosed, mid LM to Dist LM 60% stenosed. Hospitalization c/b AKI on CKD 3.   After full treatment of foot infection, he was admitted in 10/22 for CABG with LIMA-LAD, SVG-OM1, SVG-OM3.  PDA too small to graft.   Underwent L BKA 1/23 due to osteomyelitis and abscess.  Admitted 4/23 with acute on chronic CHF. Diuresed with IV lasix. Echo in 4/23 showed EF 20-25%, RV moderately reduced, mild to moderate MR. GDMT restarted. Discharged home, weight 209 lbs. Refused paramedicine and PT/OT.  Echo was done today and reviewed, EF 30%, septal hypokinesis with septal-lateral dyssynchrony, mildly decreased RV systolic function, IVC normal.   Today he returns for HF follow up with his daughter.  He still has a persistent wound on his left stump that is slowly healing.  He uses a wheelchair generally but can maneuver a walker for short distances. No dyspnea or chest pain with his usual activities.  No orthopnea/PND.  He is not smoking. No palpitations or  lightheadedness.                                                             ECG (personally reviewed): NSR, LVH with repolarization abnormality.   Labs (11/22): K 4.2, creatinine 1.38 Labs (12/22): K 4.4,  creatinine 1.94 Labs (4/23): K 4.2, creatinine 2.1, LDL 112 Labs (5/23): K 4.7 => 5.3, creatinine 1.6 => 1.34 Labs (9/23): TSH nl, K 5.3, creatinine 1.96, LFTs normal  PMH: 1. GI bleeding: Erosive esophagitis 8/22.  2. CKD stage 3 3. Type 2 diabetes 4. Hyperlipidemia 5. Left foot/leg diabetic infection with necrotizing fasciitis: 8/22, debridements were done and the left 5th metatarsal was amputated.  1/23 had left BKA.  6. Prior smoker 7. H/o PVC, VT: On amiodarone.  8. CAD: LHC in 9/22 with severe 3 vessel disease.  - CABG (10/22) with LIMA-LAD, SVG-OM2, SVG-OM3.  9. Chronic systolic CHF: Ischemic cardiomyopathy.  - Echo  (8/22): LVEF of 25%, LV demonstrating global hypokinesis, and LV diastolic parameters consistent with grade II diastolic dysfunction.  - Echo (4/23): EF 20-25%, severe LV dysfunction, RV moderately reduced, RVSP 46.8 mmHg, mild to moderate MR - Echo (10/23): EF 30%, septal hypokinesis with septal-lateral dyssynchrony, mildly decreased RV systolic function, IVC normal.   Review of Systems: All systems reviewed and negative except as per HPI.   Current Outpatient Medications  Medication Sig Dispense Refill   albuterol (VENTOLIN HFA) 108 (90 Base) MCG/ACT inhaler Inhale 2 puffs into the lungs every 4 (four) hours as needed for shortness of breath or wheezing.     amiodarone (PACERONE) 200 MG tablet Take 0.5 tablets (100 mg total) by mouth daily. 30 tablet 3   aspirin EC 81 MG tablet Take 1 tablet (81 mg total) by mouth daily. 30 tablet 11   atorvastatin (LIPITOR) 80 MG tablet Take 1 tablet (80 mg total) by mouth daily. 30 tablet 3   carvedilol (COREG) 3.125 MG tablet Take 1 tablet (3.125 mg total) by mouth 2 (two) times daily. 180 tablet 3   digoxin (LANOXIN) 0.125 MG tablet Take 1 tablet (0.125 mg total) by mouth daily. 30 tablet 3   empagliflozin (JARDIANCE) 10 MG TABS tablet Take 1 tablet (10 mg total) by mouth daily. 30 tablet 3   gabapentin (NEURONTIN) 300 MG capsule Take 1 capsule (300 mg total) by mouth at bedtime. 90 capsule 1   glipiZIDE (GLUCOTROL) 5 MG tablet Take 1 tablet (5 mg total) by mouth daily before breakfast. 30 tablet 2   glucose blood (TRUE METRIX BLOOD GLUCOSE TEST) test strip Use as instructed twice daily 100 each 12   Semaglutide,0.25 or 0.5MG /DOS, (OZEMPIC, 0.25 OR 0.5 MG/DOSE,) 2 MG/3ML SOPN Inject 0.25 mg into the skin once a week. 3 mL 6   TRUEplus Lancets 28G MISC Use as directed twice daily at 8 am and 10 pm. 100 each 11   venlafaxine XR (EFFEXOR-XR) 75 MG 24 hr capsule Take 1 capsule (75 mg total) by mouth daily with breakfast. 30 capsule 2   amoxicillin-clavulanate  (AUGMENTIN) 500-125 MG tablet Take 1 tablet (500 mg total) by mouth 3 (three) times daily. (Patient not taking: Reported on 03/31/2022) 30 tablet 0   diclofenac Sodium (VOLTAREN) 1 % GEL Apply 4 g topically 4 (four) times daily. (Patient not taking: Reported on 04/28/2022) 100 g 1   furosemide (LASIX) 40 MG tablet Take 0.5 tablets (20 mg total) by mouth daily. 30 tablet 3   sacubitril-valsartan (ENTRESTO) 24-26 MG Take 1 tablet by mouth 2 (two) times daily. 180 tablet 3   No current facility-administered medications for this encounter.   Allergies  Allergen Reactions   Morphine Itching   Social History   Socioeconomic History   Marital status: Single    Spouse name: Not on file  Number of children: 1   Years of education: Not on file   Highest education level: 8th grade  Occupational History   Occupation: none    Comment: former "[employer]"  Tobacco Use   Smoking status: Former    Packs/day: 3.00    Years: 36.00    Total pack years: 108.00    Types: Cigarettes    Quit date: 08/27/2020    Years since quitting: 1.6   Smokeless tobacco: Never   Tobacco comments:    5-6 daily  Vaping Use   Vaping Use: Never used  Substance and Sexual Activity   Alcohol use: Not Currently    Comment: Very rare   Drug use: Not Currently    Types: Marijuana   Sexual activity: Not on file  Other Topics Concern   Not on file  Social History Narrative   Not on file   Social Determinants of Health   Financial Resource Strain: High Risk (02/20/2022)   Overall Financial Resource Strain (CARDIA)    Difficulty of Paying Living Expenses: Hard  Food Insecurity: Food Insecurity Present (02/04/2022)   Hunger Vital Sign    Worried About Running Out of Food in the Last Year: Sometimes true    Ran Out of Food in the Last Year: Sometimes true  Transportation Needs: No Transportation Needs (11/06/2021)   PRAPARE - Hydrologist (Medical): No    Lack of Transportation (Non-Medical):  No  Physical Activity: Not on file  Stress: Not on file  Social Connections: Not on file  Intimate Partner Violence: Not on file   Family History  Problem Relation Age of Onset   Hypertension Mother    Heart failure Father    Hypertension Brother    Congestive Heart Failure Brother    BP 130/80   Pulse 88   SpO2 98%   Wt Readings from Last 3 Encounters:  12/24/21 97.7 kg (215 lb 6.4 oz)  11/19/21 89.7 kg (197 lb 12.8 oz)  11/08/21 95 kg (209 lb 6.4 oz)   PHYSICAL EXAM: General: NAD Neck: No JVD, no thyromegaly or thyroid nodule.  Lungs: Clear to auscultation bilaterally with normal respiratory effort. CV: Nondisplaced PMI.  Heart regular S1/S2, no S3/S4, no murmur.  No peripheral edema.  No carotid bruit.  Difficult to palpate right pedal pulses.  Abdomen: Soft, nontender, no hepatosplenomegaly, no distention.  Skin: Intact without lesions or rashes.  Neurologic: Alert and oriented x 3.  Psych: Normal affect. Extremities: No clubbing or cyanosis. Left BKA.  HEENT: Normal.   ASSESSMENT & PLAN: 1. Chronic systolic CHF: Ischemic cardiomyopathy.  Initial echo in 2/22 with EF 40-45%.  Echo in 7/22 with EF 30-35%.  Echo 8/22 EF down to 25% with mild RV dysfunction and dilated IVC.  [place] 8/22 with preserved CO, mildly elevated PCWP, mild pulmonary hypertension. S/p CABG 10/22. Readmitted 4/23 with CHF, had been off diuretics and missed follow up. Echo in 4/23 with EF 20-25%, RV moderately reduced, mild to moderate MR. Echo today showed EF 30%, septal hypokinesis with septal-lateral dyssynchrony, mildly decreased RV systolic function, IVC normal.  NYHA II though functional class difficult due to L BKA.  He is not volume overloaded on exam.  - Decrease Lasix to 20 mg daily.  - Start Entresto 24/26 bid, BMET/BNP today and BMET in 10 days.  If K is high, would add Lokelma to bind.  - Continue Jardiance 10 mg daily. - He is not taking eplerenone, will not start  until I see that K is  controlled (mildly high on 9/23 BMET).  - Continue digoxin 0.125 mg daily. Check digoxin level today.  - Continue Coreg 3.125 mg bid.  - He qualifies for an ICD but not CRT with narrow QRS.  He agreeable to ICD, but I think that his open stump wound needs to heal first due to infectious risk.  2. CAD: Hx PCI 2007. S/p CABG x 3 with LIMA-LAD, SVG-OM2, and SVG-OM3 10/22. No chest pain.  - Continue ASA 81 daily.  - Continue atorvastatin 80 mg daily. 3. PVCs/VT: Noted 10/22 post CABG.  - Continue amiodarone 100 mg daily.  Check TSH and LFTs, he will need regular eye exam.  4. CKD stage 3: BMET today.  5. GI bleeding: History of erosive esophagitis earlier in 8/22.  He is now on Protonix.   6. Left leg diabetic foot infection: Underwent left BKA 1/23. Still with open wound at stump.  - Followup with with Dr. Sharol Given.  - He needs to eventually be fitted with prosthesis.  7. DM2:  PCP managing.  Followup with APP in 2 months.    Loralie Champagne 04/28/22

## 2022-04-28 NOTE — Telephone Encounter (Signed)
Advanced Heart Failure Patient Advocate Encounter  Patient was seen in clinic today and started on Entresto [employer]).   The patient is currently uninsured.  Faxed application to [employer] 04/28/2022.   Clista Bernhardt, CPhT Rx Patient Advocate Phone: [phone]

## 2022-04-28 NOTE — Patient Instructions (Signed)
Start entresto 24/26 Twice daily  Change Lasix to 20 mg daily  Labs done today, your results will be available in MyChart, we will contact you for abnormal readings.  Repeat blood work in 10 days  Please follow up with our heart failure pharmacist in 3 weeks  Your physician recommends that you schedule a follow-up appointment in: 2 months  If you have any questions or concerns before your next appointment please send [place] a message through [place] or call our office at [phone].    TO LEAVE A MESSAGE FOR THE NURSE SELECT OPTION 2, PLEASE LEAVE A MESSAGE INCLUDING: YOUR NAME DATE OF BIRTH CALL BACK NUMBER REASON FOR CALL**this is important as we prioritize the call backs  YOU WILL RECEIVE A CALL BACK THE SAME DAY AS LONG AS YOU CALL BEFORE 4:00 PM  At the [facility], you and your health needs are our priority. As part of our continuing mission to provide you with exceptional heart care, we have created designated Provider Care Teams. These Care Teams include your primary Cardiologist (physician) and Advanced Practice Providers (APPs- Physician Assistants and Nurse Practitioners) who all work together to provide you with the care you need, when you need it.   You may see any of the following providers on your designated Care Team at your next follow up: Dr Glori Bickers Dr Loralie Champagne Dr. Roxana Hires, NP Lyda Jester, [place] [facility] [place], [place] Forestine Na, NP Audry Riles, PharmD   Please be sure to bring in all your medications bottles to every appointment.

## 2022-04-28 NOTE — Telephone Encounter (Signed)
Meds ordered this encounter  Medications   sacubitril-valsartan (ENTRESTO) 24-26 MG    Sig: Take 1 tablet by mouth 2 (two) times daily.    Dispense:  180 tablet    Refill:  3

## 2022-04-28 NOTE — Progress Notes (Signed)
Medication Samples have been provided to the patient.  Drug name: Delene Loll       Strength: 24/26        Qty: 2 bottles  LOT: [id number]  Exp.Date: 08/25  Dosing instructions: take 1 tablet Twice daily   The patient has been instructed regarding the correct time, dose, and frequency of taking this medication, including desired effects and most common side effects.   Veena Sturgess M Nazareth Norenberg 10:58 AM 04/28/2022

## 2022-04-28 NOTE — Progress Notes (Signed)
  Echocardiogram 2D Echocardiogram has been performed.  Adam James M 04/28/2022, 9:56 AM

## 2022-04-29 ENCOUNTER — Other Ambulatory Visit (HOSPITAL_COMMUNITY): Payer: Self-pay

## 2022-04-29 ENCOUNTER — Encounter: Payer: Self-pay | Admitting: Family

## 2022-04-29 ENCOUNTER — Ambulatory Visit (INDEPENDENT_AMBULATORY_CARE_PROVIDER_SITE_OTHER): Payer: Medicaid Other | Admitting: Family

## 2022-04-29 ENCOUNTER — Other Ambulatory Visit: Payer: Self-pay

## 2022-04-29 DIAGNOSIS — M86162 Other acute osteomyelitis, left tibia and fibula: Secondary | ICD-10-CM | POA: Diagnosis not present

## 2022-04-29 MED ORDER — DOXYCYCLINE HYCLATE 100 MG PO TABS
100.0000 mg | ORAL_TABLET | Freq: Two times a day (BID) | ORAL | 0 refills | Status: DC
  Filled 2022-04-29 (×2): qty 28, 14d supply, fill #0

## 2022-04-29 NOTE — Progress Notes (Signed)
Office Visit Note   Patient: Adam James           Date of Birth: Dec 27, 1970           MRN: [id number] Visit Date: 04/29/2022              Requested by: Charlott Rakes, MD [address],  [place] [id number] PCP: Charlott Rakes, MD  Chief Complaint  Patient presents with   Left Leg - Follow-up    07/30/2021 left BKA       HPI: The patient is a 51 year old gentleman who is seen today for concern of exposed bone to his left tibia.  He is status post below-knee amputation on the left he has been doing Dial soap cleansing he states while doing his wound care he noticed he could touch down to his tibia.  Assessment & Plan: Visit Diagnoses: No diagnosis found.  Plan: Exposed bone left tibia we will set him up for revision left below-knee amputation.  The patient requested same-day surgery he is also hopeful he can avoid general anesthesia Have called in a course of doxycycline.  He will continue with dry dressings.  Follow-Up Instructions: No follow-ups on file.   Ortho Exam  Patient is alert, oriented, no adenopathy, well-dressed, normal affect, normal respiratory effort. On examination of the left residual limb there is a nickel sized ulcer over the distal tibia from end bearing this does probe to bone.  There is no surrounding erythema no drainage no odor  Imaging: ECHOCARDIOGRAM COMPLETE  Result Date: 04/28/2022    ECHOCARDIOGRAM REPORT   Patient Name:   Adam James Date of Exam: 04/28/2022 Medical Rec #:  [id number]        Height:       75.0 in Accession #:    [id number]       Weight:       215.4 lb Date of Birth:  12-12-1970        BSA:          2.265 m Patient Age:    23 years         BP:           110/70 mmHg Patient Gender: M                HR:           93 bpm. Exam Location:  Outpatient Procedure: 2D Echo, Cardiac Doppler, Color Doppler and Strain Analysis Indications:    Congestive Heart Failure I50.9  History:        Patient has prior history  of Echocardiogram examinations, most                 recent 11/05/2021. Cardiomyopathy, CAD, Prior CABG; Risk                 Factors:Hypertension, Diabetes and Dyslipidemia. Chronic kidney                 disease.  Sonographer:    Darlina Sicilian RDCS Referring Phys: [place]  1. Left ventricular ejection fraction, by estimation, is 30%. The left ventricle has severely decreased function. The left ventricle demonstrates regional wall motion abnormalities with septal-lateral dyssynchrony and severe septal hypokinesis. The left  ventricular internal cavity size was mildly dilated. Left ventricular diastolic parameters are consistent with Grade I diastolic dysfunction (impaired relaxation).  2. Right ventricular systolic function is mildly reduced. The right ventricular size is normal. There is  normal pulmonary artery systolic pressure. The estimated right ventricular systolic pressure is 123XX123 mmHg.  3. The aortic valve is tricuspid. Aortic valve regurgitation is mild. No aortic stenosis is present.  4. The mitral valve is normal in structure. No evidence of mitral valve regurgitation. No evidence of mitral stenosis.  5. The inferior vena cava is normal in size with greater than 50% respiratory variability, suggesting right atrial pressure of 3 mmHg. FINDINGS  Left Ventricle: Left ventricular ejection fraction, by estimation, is 30%. The left ventricle has severely decreased function. The left ventricle demonstrates regional wall motion abnormalities. The left ventricular internal cavity size was mildly dilated. There is no left ventricular hypertrophy. Left ventricular diastolic parameters are consistent with Grade I diastolic dysfunction (impaired relaxation). Right Ventricle: The right ventricular size is normal. No increase in right ventricular wall thickness. Right ventricular systolic function is mildly reduced. There is normal pulmonary artery systolic pressure. The tricuspid regurgitant  velocity is 1.58 m/s, and with an assumed right atrial pressure of 3 mmHg, the estimated right ventricular systolic pressure is 123XX123 mmHg. Left Atrium: Left atrial size was normal in size. Right Atrium: Right atrial size was normal in size. Pericardium: There is no evidence of pericardial effusion. Mitral Valve: The mitral valve is normal in structure. No evidence of mitral valve regurgitation. No evidence of mitral valve stenosis. Tricuspid Valve: The tricuspid valve is normal in structure. Tricuspid valve regurgitation is trivial. Aortic Valve: The aortic valve is tricuspid. Aortic valve regurgitation is mild. No aortic stenosis is present. Pulmonic Valve: The pulmonic valve was normal in structure. Pulmonic valve regurgitation is not visualized. Aorta: The aortic root is normal in size and structure. Venous: The inferior vena cava is normal in size with greater than 50% respiratory variability, suggesting right atrial pressure of 3 mmHg. IAS/Shunts: No atrial level shunt detected by color flow Doppler.  LEFT VENTRICLE PLAX 2D LVIDd:         5.50 cm LVIDs:         5.00 cm LV PW:         0.90 cm LV IVS:        1.00 cm LVOT diam:     2.40 cm LV SV:         66 LV SV Index:   29 LVOT Area:     4.52 cm  LV Volumes (MOD) LV vol d, MOD A2C: 185.0 ml LV vol d, MOD A4C: 185.0 ml LV vol s, MOD A2C: 94.9 ml LV vol s, MOD A4C: 109.0 ml LV SV MOD A2C:     90.1 ml LV SV MOD A4C:     185.0 ml LV SV MOD BP:      84.3 ml RIGHT VENTRICLE RV S prime:     5.78 cm/s LEFT ATRIUM             Index LA diam:        4.10 cm 1.81 cm/m LA Vol (A2C):   38.8 ml 17.13 ml/m LA Vol (A4C):   36.2 ml 15.98 ml/m LA Biplane Vol: 38.0 ml 16.78 ml/m  AORTIC VALVE LVOT Vmax:   91.80 cm/s LVOT Vmean:  59.100 cm/s LVOT VTI:    0.145 m  AORTA Ao Root diam: 3.60 cm Ao Asc diam:  3.70 cm TRICUSPID VALVE TR Peak grad:   10.0 mmHg TR Vmax:        158.00 cm/s  SHUNTS Systemic VTI:  0.14 m Systemic Diam: 2.40 cm [employer] Electronically signed by  Dalton McleanMD Signature Date/Time: 04/28/2022/10:30:01 AM    Final    No images are attached to the encounter.  Labs: Lab Results  Component Value Date   HGBA1C 8.2 (A) 03/31/2022   HGBA1C 10.1 (H) 11/05/2021   HGBA1C 7.9 (H) 07/30/2021   ESRSEDRATE 25 (H) 03/14/2021   ESRSEDRATE >140 (H) 02/25/2021   CRP 1.6 (H) 03/14/2021   REPTSTATUS 05/06/2021 FINAL 04/30/2021   GRAMSTAIN NO WBC SEEN NO ORGANISMS SEEN  04/30/2021   CULT  04/30/2021    RARE PROVIDENCIA STUARTII RARE ALCALIGENES FAECALIS RARE NORMAL SKIN FLORA CRITICAL RESULT CALLED TO, READ BACK BY AND VERIFIED WITH: RN MARY S. [id number] FCP NO ANAEROBES ISOLATED Performed at [facility], [place] [address], [place], [place] [address]    LABORGA PROVIDENCIA STUARTII 04/30/2021   LABORGA ALCALIGENES FAECALIS 04/30/2021     Lab Results  Component Value Date   ALBUMIN 3.5 04/28/2022   ALBUMIN 4.3 03/31/2022   ALBUMIN 3.9 (L) 12/08/2021    Lab Results  Component Value Date   MG 2.1 12/08/2021   MG 2.2 11/07/2021   MG 1.6 (L) 11/06/2021   No results found for: "VD25OH"  No results found for: "PREALBUMIN"    Latest Ref Rng & Units 12/08/2021    3:09 PM 11/06/2021    1:20 AM 11/05/2021    8:40 AM  CBC EXTENDED  WBC 3.4 - 10.8 x10E3/uL 9.8  8.6  6.5   RBC 4.14 - 5.80 x10E6/uL 4.62  3.53  3.72   Hemoglobin 13.0 - 17.7 g/dL 13.2  9.9  10.5   HCT 37.5 - 51.0 % 38.0  30.5  32.6   Platelets 150 - 450 x10E3/uL 261  246  201   NEUT# 1.4 - 7.0 x10E3/uL 5.3  4.7    Lymph# 0.7 - 3.1 x10E3/uL 3.6  3.0       There is no height or weight on file to calculate BMI.  Orders:  No orders of the defined types were placed in this encounter.  Meds ordered this encounter  Medications   doxycycline (VIBRA-TABS) 100 MG tablet    Sig: Take 1 tablet (100 mg total) by mouth 2 (two) times daily.    Dispense:  28 tablet    Refill:  0     Procedures: No procedures performed  Clinical Data: No additional  findings.  ROS:  All other systems negative, except as noted in the HPI. Review of Systems  Objective: Vital Signs: There were no vitals taken for this visit.  Specialty Comments:  No specialty comments available.  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   Acute exacerbation of CHF (congestive heart failure) ([place]) 04/28/2021   Venous stasis ulcer of right calf limited to breakdown of skin without varicose veins (HCC)    Acute on chronic combined systolic and diastolic CHF (congestive heart failure) ([place])    Acute on chronic heart failure ([place]) 03/23/2021   NSTEMI (non-ST elevated myocardial infarction) ([place])    Gastroesophageal reflux disease with esophagitis and hemorrhage    Anemia, posthemorrhagic, acute    Acute esophagitis    Gastrointestinal hemorrhage    Necrotizing fasciitis ([place])  Diabetic polyneuropathy associated with type 2 diabetes mellitus (HCC)    Severe protein-calorie malnutrition (HCC)    Type 2 diabetes mellitus ([place])    Heart failure (HCC)    Dyslipidemia    CAD (coronary artery disease)    Cardiomyopathy ([place])    Coronary artery disease involving native coronary artery of native heart with angina pectoris [facility])    Past Medical History:  Diagnosis Date   Anemia    GI bleed; 03/13/11 EGD: relux esophagitis with ulceration/clot   CAD (coronary artery disease)    Cardiomyopathy (HCC)    CHF (congestive heart failure) (HCC)    CKD (chronic kidney disease)    Diabetic foot ulcer ([place])    Dyslipidemia    Dyspnea    Heart failure (HCC)    Hypertension    Myocardial infarction ([place])    Type 2 diabetes mellitus ([place])     Family  History  Problem Relation Age of Onset   Hypertension Mother    Heart failure Father    Hypertension Brother    Congestive Heart Failure Brother     Past Surgical History:  Procedure Laterality Date   AMPUTATION Left 04/30/2021   Procedure: LEFT FOOT 5TH RAY AMPUTATION APPLICATION OF A-CELL POWDER;  Surgeon: Newt Minion, MD;  Location: [facility];  Service: Orthopedics;  Laterality: Left;   AMPUTATION Left 07/30/2021   Procedure: LEFT BELOW KNEE AMPUTATION;  Surgeon: Newt Minion, MD;  Location: [facility];  Service: Orthopedics;  Laterality: Left;   APPENDECTOMY     APPLICATION OF WOUND VAC Left 03/03/2021   Procedure: APPLICATION OF WOUND VAC;  Surgeon: Leandrew Koyanagi, MD;  Location: [facility];  Service: Orthopedics;  Laterality: Left;   APPLICATION OF WOUND VAC  03/07/2021   Procedure: APPLICATION OF WOUND VAC;  Surgeon: Newt Minion, MD;  Location: [facility];  Service: Orthopedics;;   APPLICATION OF WOUND VAC  07/30/2021   Procedure: APPLICATION OF WOUND VAC;  Surgeon: Newt Minion, MD;  Location: [facility];  Service: Orthopedics;;   CARDIAC CATHETERIZATION     COLON SURGERY     CORONARY ARTERY BYPASS GRAFT N/A 05/22/2021   Procedure: CORONARY ARTERY BYPASS GRAFTING (CABG) X 3 USING LEFT INTERNAL MAMMARY ARTERY AND RIGHT ENDOSCOPIC GREATER SAPHENOUS VEIN CONDUITS;  Surgeon: Lajuana Matte, MD;  Location: [facility];  Service: Open Heart Surgery;  Laterality: N/A;   ENDOVEIN HARVEST OF GREATER SAPHENOUS VEIN Right 05/22/2021   Procedure: ENDOVEIN HARVEST OF GREATER SAPHENOUS VEIN;  Surgeon: Lajuana Matte, MD;  Location: [facility];  Service: Open Heart Surgery;  Laterality: Right;   ESOPHAGOGASTRODUODENOSCOPY (EGD) WITH PROPOFOL N/A 03/12/2021   Procedure: ESOPHAGOGASTRODUODENOSCOPY (EGD) WITH PROPOFOL;  Surgeon: Daryel November, MD;  Location: [facility];  Service: Gastroenterology;  Laterality: N/A;   I & D EXTREMITY Left 02/24/2021   Procedure: IRRIGATION AND DEBRIDEMENT EXTREMITY;  Surgeon:  Altamese Kingston, MD;  Location: [facility];  Service: Orthopedics;  Laterality: Left;   I & D EXTREMITY Left 02/26/2021   Procedure: IRRIGATION AND DEBRIDEMENT OF LEG;  Surgeon: Newt Minion, MD;  Location: [facility];  Service: Orthopedics;  Laterality: Left;   I & D EXTREMITY Left 03/03/2021   Procedure: LEFT LEG IRRIGATION AND DEBRIDEMENT;  Surgeon: Leandrew Koyanagi, MD;  Location: [facility];  Service: Orthopedics;  Laterality: Left;   I & D EXTREMITY Left 03/07/2021   Procedure: REPEAT DEBRIDEMENT LEFT LEG;  Surgeon: Newt Minion, MD;  Location: [facility];  Service:  Orthopedics;  Laterality: Left;   IR FLUORO GUIDE CV LINE RIGHT  03/10/2021   IR REMOVAL TUN CV CATH W/O FL  03/24/2021   IR US GUIDE VASC ACCESS RIGHT  03/10/2021   LEFT HEART CATH AND CORONARY ANGIOGRAPHY N/A 09/13/2020   Procedure: LEFT HEART CATH AND CORONARY ANGIOGRAPHY;  Surgeon: Troy Sine, MD;  Location: [facility];  Service: Cardiovascular;  Laterality: N/A;   LEFT HEART CATH AND CORONARY ANGIOGRAPHY N/A 03/28/2021   Procedure: LEFT HEART CATH AND CORONARY ANGIOGRAPHY;  Surgeon: Larey Dresser, MD;  Location: [facility];  Service: Cardiovascular;  Laterality: N/A;   RIGHT HEART CATH N/A 03/26/2021   Procedure: RIGHT HEART CATH;  Surgeon: Larey Dresser, MD;  Location: [facility];  Service: Cardiovascular;  Laterality: N/A;   TEE WITHOUT CARDIOVERSION N/A 05/22/2021   Procedure: TRANSESOPHAGEAL ECHOCARDIOGRAM (TEE);  Surgeon: Lajuana Matte, MD;  Location: [facility];  Service: Open Heart Surgery;  Laterality: N/A;   TOE AMPUTATION  2020   Social History   Occupational History   Occupation: none    Comment: former "[employer]"  Tobacco Use   Smoking status: Former    Packs/day: 3.00    Years: 36.00    Total pack years: 108.00    Types: Cigarettes    Quit date: 08/27/2020    Years since quitting: 1.6   Smokeless tobacco: Never   Tobacco comments:    5-6 daily  Vaping Use   Vaping Use: Never used  Substance  and Sexual Activity   Alcohol use: Not Currently    Comment: Very rare   Drug use: Not Currently    Types: Marijuana   Sexual activity: Not on file

## 2022-04-30 ENCOUNTER — Telehealth (HOSPITAL_COMMUNITY): Payer: Self-pay | Admitting: Licensed Clinical Social Worker

## 2022-04-30 ENCOUNTER — Other Ambulatory Visit: Payer: Self-pay

## 2022-04-30 ENCOUNTER — Telehealth (HOSPITAL_COMMUNITY): Payer: Self-pay

## 2022-04-30 ENCOUNTER — Other Ambulatory Visit (HOSPITAL_COMMUNITY): Payer: Self-pay

## 2022-04-30 NOTE — Telephone Encounter (Signed)
H&V Care Navigation CSW Progress Note  Clinical Social Worker received call from pt reporting issues getting his medications and needing assistance.  Spoke with CHW regarding medications he gets there and they report he needs to pay before having them shipped because he doesn't have payment method on file- CSW assisted in paying $18 copays.  CSW then called [facility] outpatient pharmacy and they confirm they already shipped medications yesterday.  Pt informed of above- no further concerns at this time   [place] Present (02/04/2022)  Housing: Low Risk  (11/06/2021)  Transportation Needs: No Transportation Needs (11/06/2021)  Alcohol Screen: Low Risk  (03/26/2021)  Depression (PHQ2-9): High Risk (03/31/2022)  Financial Resource Strain: High Risk (02/20/2022)  Tobacco Use: Medium Risk (04/29/2022)   [name], [facility] Desk#: [phone] Cell#: [phone]

## 2022-04-30 NOTE — Telephone Encounter (Signed)
Advanced Heart Failure Patient Advocate Encounter  Review of patient chart due to changes with the HF Fund. Need to submit new application to [facility] for [place]. Left message on patient voicemail.  Clista Bernhardt, CPhT Rx Patient Advocate Phone: [phone]

## 2022-05-05 ENCOUNTER — Other Ambulatory Visit: Payer: Self-pay

## 2022-05-05 ENCOUNTER — Encounter (HOSPITAL_COMMUNITY): Payer: Self-pay | Admitting: Orthopedic Surgery

## 2022-05-05 NOTE — Telephone Encounter (Signed)
Advanced Heart Failure Patient Advocate Encounter  Application was filled out and submitted from another office.  Review of media tab confirms that this patient has been approved for [place] assistance.  Effective dates: 0/09/2021-04/28/2023  Clista Bernhardt, CPhT Rx Patient Advocate Phone: [phone]

## 2022-05-05 NOTE — Progress Notes (Signed)
Anesthesia Chart Review: SAME DAY WORK-UP  Case: [id number] Date/Time: 05/06/22 1105   Procedure: REVISION LEFT BELOW KNEE AMPUTATION (Left)   Anesthesia type: Choice   Pre-op diagnosis: Osteomyelitis Left Tibia   Location: [facility] OR ROOM 04 / [place] [facility]   Surgeons: Newt Minion, MD       DISCUSSION: Patient is a 51 year old male scheduled for the above procedure. 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 By 04/29/22 orthopedic surgery follow-up, he has exposed tibial and revision recommended.   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  Last HF cardiology follow-up with Dr. Aundra Dubin was on 04/28/22. He noted, "Echo today showed EF 30% [previously 20-25%], septal hypokinesis with septal-lateral dyssynchrony, mildly decreased RV systolic function, IVC normal.  NYHA II though functional class difficult due to L BKA.  He is not volume overloaded on exam." Lasix decreased to 20 mg daily and started on Entresto 24/26/ mg BID. Continue Jardiance and Coreg. Eplerenone on hold due to hyperkalemia. He noted that patient qualifies for an ICD but not CRT due to narrow QRS; however, he can not proceed with ICD until his stump wound is healed due to infectious risk. No chest pain, continue ASA 81 mg and atorvastatin. On amiodarone for PVCs/VT. He is also on digoxin. Two month follow-up recommended.    A1c 8.2% 03/31/22 (down from 10.1% on 11/05/21). He is on semaglutide  0.25 mg weekly, glipizide 5 mg daily, and Jardiance 10 mg daily. He is a same day work-up, scheduled for surgery tomorrow (05/06/22) and he had not received prior instructions to hold Jardiance or Ozempic. Last Ozempic reported was on Wednesday 04/29/22 and last Jardiance 05/04/22. Anesthesia team to evaluate on the day of surgery.     VS:  BP Readings from Last 3 Encounters:  04/28/22 130/80  03/31/22 110/73  02/23/22 110/72   Pulse Readings from Last 3 Encounters:  04/28/22 88  03/31/22 89  02/23/22 91     PROVIDERS: Charlott Rakes, MD is PCP [facility]) Shirlee More, MD is cardiologist. Last primary cardiology note was on 11/04/21 by Jenkins Rouge, MD during CHF exacerbation admission. He is primarily followed by Dr. Aundra Dubin currently. Loralie Champagne, MD is HF cardiologist Dustin Flock, MD is GI Meridee Score, MD is orthopedic surgeon Melodie Bouillon, MD is CT surgeon   LABS: For day of surgery as indicated. Last labs as of 04/28/22 include: Lab Results  Component Value Date   WBC 9.8 12/08/2021   HGB 13.2 12/08/2021   HCT 38.0 12/08/2021   PLT 261 12/08/2021   GLUCOSE 120 (H) 04/28/2022   ALT 17 04/28/2022   AST 17 04/28/2022   NA 141 04/28/2022   K 4.4 04/28/2022   CL 108 04/28/2022   CREATININE 1.97 (H) 04/28/2022   BUN 32 (H) 04/28/2022   CO2 23 04/28/2022   TSH 2.160 03/31/2022   HGBA1C 8.2 (A) 03/31/2022   Previous Creatinine trends: 03/31/22: 1.96 12/24/21: 1.34 12/08/21: 1.60 11/19/21: 2.08 11/08/21: 2.10 11/07/21: 1.87  OTHER: EGD 03/12/21 (for acute hemorrhagic anemia, coffee-ground emesis): IMPRESSION: - The examined portions of the nasopharynx, oropharynx and larynx were normal. - LA Grade D reflux esophagitis with ulceration/adherent clot and recent bleeding, but no active bleeding during the procedure - Clotted blood in the gastric body, otherwise normal stomach. - Normal examined duodenum. - No specimens  collected. Recommendations: Protonix 40 mg BID x 8 week, sucralfate QID x 2 week, repeat EGD in 8 weeks recommend.    IMAGES: CXR 11/04/21: IMPRESSION: Interstitial pulmonary edema and small bilateral pleural effusions.     EKG: 04/28/22 ([facility]): Normal sinus rhythm Left ventricular hypertrophy with repolarization abnormality ( R in aVL , Sokolow-Lyon , Romhilt-Estes ) Abnormal ECG When compared with ECG of 23-Feb-2022 14:40, No significant change was found No significant change since last tracing Confirmed by Glenetta Hew [id number]) on 04/28/2022 11:14:41 PM    CV: Echo 04/28/22: IMPRESSIONS   1. Left ventricular ejection fraction, by estimation, is 30%. The left  ventricle has severely decreased function. The left ventricle demonstrates  regional wall motion abnormalities with septal-lateral dyssynchrony and  severe septal hypokinesis. The left   ventricular internal cavity size was mildly dilated. Left ventricular  diastolic parameters are consistent with Grade I diastolic dysfunction  (impaired relaxation).   2. Right ventricular systolic function is mildly reduced. The right  ventricular size is normal. There is normal pulmonary artery systolic  pressure. The estimated right ventricular systolic pressure is 123XX123 mmHg.   3. The aortic valve is tricuspid. Aortic valve regurgitation is mild. No  aortic stenosis is present.   4. The mitral valve is normal in structure. No evidence of mitral valve  regurgitation. No evidence of mitral stenosis.   5. The inferior vena cava is normal in size with greater than 50%  respiratory variability, suggesting right atrial pressure of 3 mmHg.  - Comparison: LVEF 20-25%, global LV hypokinesis, moderately reduced RVSF, moderate elevated PASP, RVSP 46.8 mmHg, mild-moderate MR, trivial AI, mild TR 11/05/21; LVEF 25% 03/24/21; LVEF 30-35%, anterior, anteroseptal and inferoseptal hypokinesis 02/20/21; LVEF 40-45%, global LV hypokinesis 09/13/20;  LVEF 30% 08/31/20     LHC 03/28/21 (PRE-CABG):   Ost LAD to Mid LAD lesion is 90% stenosed.   Mid LAD to Dist LAD lesion is 90% stenosed.   Dist LAD lesion is 100% stenosed.   Prox Cx lesion is 70% stenosed.   Prox RCA to Mid RCA lesion is 100% stenosed.   Dist RCA lesion is 100% stenosed.   Ramus lesion is 70% stenosed.   1st Mrg lesion is 50% stenosed.   Mid LM to Dist LM lesion is 60% stenosed. - S/p CABG 05/22/21: LIMA-LAD, SVG-OM1, SVG-OM3     RHC 03/26/21: Hemodynamics (mmHg) RA mean 7 RV 41/10 PA 36/13, mean 27 PCWP mean 21  Oxygen saturations: PA 61% AO 95%  Cardiac Output (Fick) 9.33  Cardiac Index (Fick) 3.74 PVR < 1 WU   1. Preserved cardiac output.  2. Mildly elevated PCWP.  3. Mild pulmonary venous hypertension.      US Carotid 02/20/21: Summary:  Right Carotid: Velocities in the right ICA are consistent with a 1-39%  stenosis.  Left Carotid: Velocities in the left ICA are consistent with a 1-39%  stenosis.  Vertebrals:  Bilateral vertebral arteries demonstrate antegrade flow.  Subclavians: Normal flow hemodynamics were seen in bilateral subclavian arteries     MRI Cardiac 11/13/20: IMPRESSION: 1. Mild LVE with RWMAls distal septum anterior wall inferior apex and mid inferior wall. EF  40% 2. Viable myocardium No areas of thinning or akinesis. Small area of subendocardial scar in the mid and apical inferior wall    Past Medical History:  Diagnosis Date   Anemia    GI bleed; 03/13/11 EGD: relux esophagitis with ulceration/clot   CAD (coronary artery disease)    Cardiomyopathy (HCC)    CHF (congestive heart failure) (HCC)    CKD (chronic kidney disease)    Diabetic foot ulcer ([place])    Dyslipidemia    Dyspnea    Dysrhythmia    PVCs   Heart failure ([place])    Hypertension    Myocardial infarction ([place])    Type 2 diabetes mellitus ([place])     Past Surgical History:  Procedure Laterality Date   AMPUTATION Left 04/30/2021   Procedure: LEFT FOOT 5TH  RAY AMPUTATION APPLICATION OF A-CELL POWDER;  Surgeon: Newt Minion, MD;  Location: [place];  Service: Orthopedics;  Laterality: Left;   AMPUTATION Left 07/30/2021   Procedure: LEFT BELOW KNEE AMPUTATION;  Surgeon: Newt Minion, MD;  Location: [place];  Service: Orthopedics;  Laterality: Left;   APPENDECTOMY     APPLICATION OF WOUND VAC Left 03/03/2021   Procedure: APPLICATION OF WOUND VAC;  Surgeon: Leandrew Koyanagi, MD;  Location: [place];  Service: Orthopedics;  Laterality: Left;   APPLICATION OF WOUND VAC  03/07/2021   Procedure: APPLICATION OF WOUND VAC;  Surgeon: Newt Minion, MD;  Location: [place];  Service: Orthopedics;;   APPLICATION OF WOUND VAC  07/30/2021   Procedure: APPLICATION OF WOUND VAC;  Surgeon: Newt Minion, MD;  Location: [place];  Service: Orthopedics;;   CARDIAC CATHETERIZATION     COLON SURGERY     CORONARY ARTERY BYPASS GRAFT N/A 05/22/2021   Procedure: CORONARY ARTERY BYPASS GRAFTING (CABG) X 3 USING LEFT INTERNAL MAMMARY ARTERY AND RIGHT ENDOSCOPIC GREATER SAPHENOUS VEIN CONDUITS;  Surgeon: Lajuana Matte, MD;  Location: [place];  Service: Open Heart Surgery;  Laterality: N/A;   ENDOVEIN HARVEST OF GREATER SAPHENOUS VEIN Right 05/22/2021   Procedure: ENDOVEIN HARVEST OF GREATER SAPHENOUS VEIN;  Surgeon: Lajuana Matte, MD;  Location: [place];  Service: Open Heart Surgery;  Laterality: Right;   ESOPHAGOGASTRODUODENOSCOPY (EGD) WITH PROPOFOL N/A 03/12/2021   Procedure: ESOPHAGOGASTRODUODENOSCOPY (EGD) WITH PROPOFOL;  Surgeon: Daryel November, MD;  Location: [place];  Service: Gastroenterology;  Laterality: N/A;   I & D EXTREMITY Left 02/24/2021   Procedure: IRRIGATION AND DEBRIDEMENT EXTREMITY;  Surgeon: Altamese Odon, MD;  Location: [place];  Service: Orthopedics;  Laterality: Left;   I & D EXTREMITY Left 02/26/2021   Procedure: IRRIGATION AND DEBRIDEMENT OF LEG;  Surgeon: Newt Minion, MD;  Location: [place];  Service: Orthopedics;  Laterality: Left;   I & D  EXTREMITY Left 03/03/2021   Procedure: LEFT LEG IRRIGATION AND DEBRIDEMENT;  Surgeon: Leandrew Koyanagi, MD;  Location: [place];  Service: Orthopedics;  Laterality: Left;   I & D EXTREMITY Left 03/07/2021   Procedure: REPEAT DEBRIDEMENT LEFT LEG;  Surgeon: Newt Minion, MD;  Location: [place];  Service: Orthopedics;  Laterality: Left;   IR FLUORO GUIDE CV LINE RIGHT  03/10/2021   IR REMOVAL TUN CV CATH W/O FL  03/24/2021   IR US GUIDE VASC ACCESS RIGHT  03/10/2021   LEFT HEART CATH AND CORONARY ANGIOGRAPHY N/A 09/13/2020   Procedure: LEFT HEART CATH AND CORONARY ANGIOGRAPHY;  Surgeon: Troy Sine, MD;  Location: [place] CV LAB;  Service:  Cardiovascular;  Laterality: N/A;   LEFT HEART CATH AND CORONARY ANGIOGRAPHY N/A 03/28/2021   Procedure: LEFT HEART CATH AND CORONARY ANGIOGRAPHY;  Surgeon: Larey Dresser, MD;  Location: [place] CV LAB;  Service: Cardiovascular;  Laterality: N/A;   RIGHT HEART CATH N/A 03/26/2021   Procedure: RIGHT HEART CATH;  Surgeon: Larey Dresser, MD;  Location: [place] CV LAB;  Service: Cardiovascular;  Laterality: N/A;   TEE WITHOUT CARDIOVERSION N/A 05/22/2021   Procedure: TRANSESOPHAGEAL ECHOCARDIOGRAM (TEE);  Surgeon: Lajuana Matte, MD;  Location: [place];  Service: Open Heart Surgery;  Laterality: N/A;   TOE AMPUTATION  2020    MEDICATIONS: No current facility-administered medications for this encounter.    albuterol (VENTOLIN HFA) 108 (90 Base) MCG/ACT inhaler   amiodarone (PACERONE) 200 MG tablet   amoxicillin-clavulanate (AUGMENTIN) 500-125 MG tablet   aspirin EC 81 MG tablet   atorvastatin (LIPITOR) 80 MG tablet   carvedilol (COREG) 3.125 MG tablet   diclofenac Sodium (VOLTAREN) 1 % GEL   digoxin (LANOXIN) 0.125 MG tablet   doxycycline (VIBRA-TABS) 100 MG tablet   empagliflozin (JARDIANCE) 10 MG TABS tablet   furosemide (LASIX) 40 MG tablet   gabapentin (NEURONTIN) 300 MG capsule   glipiZIDE (GLUCOTROL) 5 MG tablet   glucose blood (TRUE  METRIX BLOOD GLUCOSE TEST) test strip   sacubitril-valsartan (ENTRESTO) 24-26 MG   Semaglutide,0.25 or 0.5MG /DOS, (OZEMPIC, 0.25 OR 0.5 MG/DOSE,) 2 MG/3ML SOPN   TRUEplus Lancets 28G MISC   venlafaxine XR (EFFEXOR-XR) 75 MG 24 hr capsule    Myra Gianotti, PA-C Surgical Short Stay/Anesthesiology [facility] Phone [phone] [facility] Phone [phone] 05/05/2022 11:25 AM

## 2022-05-05 NOTE — Anesthesia Preprocedure Evaluation (Signed)
Anesthesia Evaluation  Patient identified by MRN, date of birth, ID band Patient awake    Reviewed: Allergy & Precautions, H&P , NPO status , Patient's Chart, lab work & pertinent test results  Airway Mallampati: II   Neck ROM: full    Dental   Pulmonary shortness of breath, former smoker,    breath sounds clear to auscultation       Cardiovascular hypertension, + CAD, + Past MI and +CHF  + dysrhythmias  Rhythm:regular Rate:Normal  TTE (04/28/22): EF 30%   Neuro/Psych PSYCHIATRIC DISORDERS Depression  Neuromuscular disease    GI/Hepatic   Endo/Other  diabetes, Type 2  Renal/GU Renal InsufficiencyRenal disease     Musculoskeletal   Abdominal   Peds  Hematology   Anesthesia Other Findings   Reproductive/Obstetrics                            Anesthesia Physical Anesthesia Plan  ASA: 3  Anesthesia Plan: General   Post-op Pain Management:    Induction: Intravenous  PONV Risk Score and Plan: 2 and Dexamethasone, Midazolam, Ondansetron and Treatment may vary due to age or medical condition  Airway Management Planned: LMA  Additional Equipment:   Intra-op Plan:   Post-operative Plan: Extubation in OR  Informed Consent: I have reviewed the patients History and Physical, chart, labs and discussed the procedure including the risks, benefits and alternatives for the proposed anesthesia with the patient or authorized representative who has indicated his/her understanding and acceptance.     Dental advisory given  Plan Discussed with: CRNA, Anesthesiologist and Surgeon  Anesthesia Plan Comments: (See PAT note written 05/05/2022 by Myra Gianotti, PA-C. )      Anesthesia Quick Evaluation

## 2022-05-05 NOTE — Progress Notes (Signed)
Adam James denies chest pain or shortness of breath.Patient denies having any s/s of Covid in his household, also denies any known exposure to Covid. Adam James reports that he was diagnosed with Covid, patient said , "I did not have Covid, they gave me an inhaler,I never use it."  Adam James has type II diabetes.  Patient takes  Glipizide, I instructed patient to not take tonight.  I asked if anyone had given him instructions regarding diabetic medications, patient said no. I instructed patient to not take Ozempic in am. I instructed Adam James to check CBG after awaking and every 2 hours until arrival  to the hospital.  I Instructed patient if CBG is less than 70 to take 4 Glucose Tablets or 1 tube of Glucose Gel or 1/2 cup of a clear juice. Recheck CBG in 15 minutes if CBG is not over 70 call, pre- op desk at [phone] for further instructions. Adam James states that he checks CBG every three days, it runs 100-130.  Adam James 's PCP is Dr.Enlbong Margarita Rana, cardiologist is Dr. Bettina Gavia, HF cardiologist is Dr. Aundra Dubin.

## 2022-05-06 ENCOUNTER — Other Ambulatory Visit (HOSPITAL_COMMUNITY): Payer: Self-pay

## 2022-05-06 ENCOUNTER — Ambulatory Visit (HOSPITAL_BASED_OUTPATIENT_CLINIC_OR_DEPARTMENT_OTHER): Payer: Medicaid Other | Admitting: Vascular Surgery

## 2022-05-06 ENCOUNTER — Encounter (HOSPITAL_COMMUNITY): Payer: Self-pay | Admitting: Orthopedic Surgery

## 2022-05-06 ENCOUNTER — Encounter (HOSPITAL_COMMUNITY)
Admission: RE | Disposition: A | Discharge status: Home / Self Care | Payer: Self-pay | Source: Home / Self Care | Attending: Orthopedic Surgery

## 2022-05-06 ENCOUNTER — Ambulatory Visit (HOSPITAL_COMMUNITY): Payer: Medicaid Other | Admitting: Vascular Surgery

## 2022-05-06 ENCOUNTER — Ambulatory Visit (HOSPITAL_COMMUNITY)
Admission: RE | Admit: 2022-05-06 | Discharge: 2022-05-06 | Disposition: A | Discharge status: Home / Self Care | Payer: Medicaid Other | Attending: Orthopedic Surgery | Admitting: Orthopedic Surgery

## 2022-05-06 ENCOUNTER — Other Ambulatory Visit: Payer: Self-pay

## 2022-05-06 DIAGNOSIS — T8781 Dehiscence of amputation stump: Secondary | ICD-10-CM

## 2022-05-06 DIAGNOSIS — I110 Hypertensive heart disease with heart failure: Secondary | ICD-10-CM

## 2022-05-06 DIAGNOSIS — N189 Chronic kidney disease, unspecified: Secondary | ICD-10-CM | POA: Diagnosis not present

## 2022-05-06 DIAGNOSIS — Z87891 Personal history of nicotine dependence: Secondary | ICD-10-CM | POA: Diagnosis not present

## 2022-05-06 DIAGNOSIS — M869 Osteomyelitis, unspecified: Secondary | ICD-10-CM | POA: Insufficient documentation

## 2022-05-06 DIAGNOSIS — I130 Hypertensive heart and chronic kidney disease with heart failure and stage 1 through stage 4 chronic kidney disease, or unspecified chronic kidney disease: Secondary | ICD-10-CM | POA: Diagnosis not present

## 2022-05-06 DIAGNOSIS — I252 Old myocardial infarction: Secondary | ICD-10-CM | POA: Diagnosis not present

## 2022-05-06 DIAGNOSIS — S88112A Complete traumatic amputation at level between knee and ankle, left lower leg, initial encounter: Secondary | ICD-10-CM

## 2022-05-06 DIAGNOSIS — E1122 Type 2 diabetes mellitus with diabetic chronic kidney disease: Secondary | ICD-10-CM | POA: Insufficient documentation

## 2022-05-06 DIAGNOSIS — I2510 Atherosclerotic heart disease of native coronary artery without angina pectoris: Secondary | ICD-10-CM | POA: Diagnosis not present

## 2022-05-06 DIAGNOSIS — I509 Heart failure, unspecified: Secondary | ICD-10-CM

## 2022-05-06 DIAGNOSIS — E1169 Type 2 diabetes mellitus with other specified complication: Secondary | ICD-10-CM | POA: Insufficient documentation

## 2022-05-06 DIAGNOSIS — Y835 Amputation of limb(s) as the cause of abnormal reaction of the patient, or of later complication, without mention of misadventure at the time of the procedure: Secondary | ICD-10-CM | POA: Diagnosis not present

## 2022-05-06 HISTORY — DX: Dehiscence of amputation stump: T87.81

## 2022-05-06 HISTORY — PX: STUMP REVISION: SHX6102

## 2022-05-06 HISTORY — DX: Cardiac arrhythmia, unspecified: I49.9

## 2022-05-06 LAB — GLUCOSE, CAPILLARY
Glucose-Capillary: 184 mg/dL — ABNORMAL HIGH (ref 70–99)
Glucose-Capillary: 169 mg/dL — ABNORMAL HIGH (ref 70–99)

## 2022-05-06 LAB — SURGICAL PCR SCREEN
MRSA, PCR: NEGATIVE
Staphylococcus aureus: NEGATIVE

## 2022-05-06 SURGERY — REVISION, AMPUTATION SITE
Anesthesia: General | Laterality: Left

## 2022-05-06 MED ORDER — ORAL CARE MOUTH RINSE: 15.0000 mL | Freq: Once | OROMUCOSAL | Status: AC

## 2022-05-06 MED ORDER — PHENYLEPHRINE 80 MCG/ML (10ML) SYRINGE FOR IV PUSH (FOR BLOOD PRESSURE SUPPORT)
PREFILLED_SYRINGE | INTRAVENOUS | Status: DC | PRN
  Administered 2022-05-06 (×3): 80 ug via INTRAVENOUS

## 2022-05-06 MED ORDER — MIDAZOLAM HCL 2 MG/2ML IJ SOLN
INTRAMUSCULAR | Status: AC
  Filled 2022-05-06: qty 2

## 2022-05-06 MED ORDER — FENTANYL CITRATE (PF) 100 MCG/2ML IJ SOLN
INTRAMUSCULAR | Status: AC
  Filled 2022-05-06: qty 2

## 2022-05-06 MED ORDER — ACETAMINOPHEN 10 MG/ML IV SOLN
INTRAVENOUS | Status: AC
  Filled 2022-05-06: qty 100

## 2022-05-06 MED ORDER — 0.9 % SODIUM CHLORIDE (POUR BTL) OPTIME
TOPICAL | Status: DC | PRN
  Administered 2022-05-06: 1000 mL

## 2022-05-06 MED ORDER — OXYCODONE HCL 5 MG PO TABS
5.0000 mg | ORAL_TABLET | Freq: Once | ORAL | Status: AC | PRN
  Administered 2022-05-06: 5 mg via ORAL

## 2022-05-06 MED ORDER — CHLORHEXIDINE GLUCONATE 0.12 % MT SOLN: 15.0000 mL | Freq: Once | OROMUCOSAL | Status: AC

## 2022-05-06 MED ORDER — LIDOCAINE 2% (20 MG/ML) 5 ML SYRINGE
INTRAMUSCULAR | Status: DC | PRN
  Administered 2022-05-06: 60 mg via INTRAVENOUS

## 2022-05-06 MED ORDER — CEFAZOLIN SODIUM-DEXTROSE 2-3 GM-%(50ML) IV SOLR
INTRAVENOUS | Status: DC | PRN
  Administered 2022-05-06: 2 g via INTRAVENOUS

## 2022-05-06 MED ORDER — SODIUM CHLORIDE 0.9 % IV SOLN: INTRAVENOUS | Status: DC

## 2022-05-06 MED ORDER — OXYCODONE HCL 5 MG/5ML PO SOLN: 5.0000 mg | Freq: Once | ORAL | Status: AC | PRN

## 2022-05-06 MED ORDER — CHLORHEXIDINE GLUCONATE 0.12 % MT SOLN
OROMUCOSAL | Status: AC
  Administered 2022-05-06: 15 mL via OROMUCOSAL
  Filled 2022-05-06: qty 15

## 2022-05-06 MED ORDER — ONDANSETRON HCL 4 MG/2ML IJ SOLN
INTRAMUSCULAR | Status: DC | PRN
  Administered 2022-05-06: 4 mg via INTRAVENOUS

## 2022-05-06 MED ORDER — HYDROCODONE-ACETAMINOPHEN 5-325 MG PO TABS
1.0000 | ORAL_TABLET | ORAL | 0 refills | Status: DC | PRN
  Filled 2022-05-06: qty 30, 5d supply, fill #0

## 2022-05-06 MED ORDER — ACETAMINOPHEN 10 MG/ML IV SOLN
INTRAVENOUS | Status: DC | PRN
  Administered 2022-05-06: 1000 mg via INTRAVENOUS

## 2022-05-06 MED ORDER — PROPOFOL 10 MG/ML IV BOLUS
INTRAVENOUS | Status: DC | PRN
  Administered 2022-05-06: 180 mg via INTRAVENOUS

## 2022-05-06 MED ORDER — ONDANSETRON HCL 4 MG/2ML IJ SOLN: 4.0000 mg | Freq: Four times a day (QID) | INTRAMUSCULAR | Status: DC | PRN

## 2022-05-06 MED ORDER — OXYCODONE HCL 5 MG PO TABS
ORAL_TABLET | ORAL | Status: AC
  Filled 2022-05-06: qty 1

## 2022-05-06 MED ORDER — FENTANYL CITRATE (PF) 100 MCG/2ML IJ SOLN
INTRAMUSCULAR | Status: DC | PRN
  Administered 2022-05-06: 25 ug via INTRAVENOUS
  Administered 2022-05-06: 50 ug via INTRAVENOUS
  Administered 2022-05-06: 25 ug via INTRAVENOUS

## 2022-05-06 MED ORDER — FENTANYL CITRATE (PF) 100 MCG/2ML IJ SOLN
25.0000 ug | INTRAMUSCULAR | Status: DC | PRN
  Administered 2022-05-06 (×2): 50 ug via INTRAVENOUS

## 2022-05-06 MED ORDER — MIDAZOLAM HCL 5 MG/5ML IJ SOLN
INTRAMUSCULAR | Status: DC | PRN
  Administered 2022-05-06: 2 mg via INTRAVENOUS

## 2022-05-06 SURGICAL SUPPLY — 35 items
BAG COUNTER SPONGE SURGICOUNT (BAG) ×1 IMPLANT
BAG SPNG CNTER NS LX DISP (BAG) ×1
BLADE SAW RECIP 87.9 MT (BLADE) IMPLANT
BLADE SURG 21 STRL SS (BLADE) ×1 IMPLANT
CANISTER WOUND CARE 500ML ATS (WOUND CARE) ×1 IMPLANT
COVER SURGICAL LIGHT HANDLE (MISCELLANEOUS) ×1 IMPLANT
DRAPE DERMATAC (DRAPES) IMPLANT
DRAPE EXTREMITY T 121X128X90 (DISPOSABLE) ×1 IMPLANT
DRAPE HALF SHEET 40X57 (DRAPES) ×1 IMPLANT
DRAPE INCISE IOBAN 66X45 STRL (DRAPES) ×1 IMPLANT
DRAPE U-SHAPE 47X51 STRL (DRAPES) ×2 IMPLANT
DRESSING PREVENA PLUS CUSTOM (GAUZE/BANDAGES/DRESSINGS) ×1 IMPLANT
DRSG PREVENA PLUS CUSTOM (GAUZE/BANDAGES/DRESSINGS) ×1
DURAPREP 26ML APPLICATOR (WOUND CARE) ×1 IMPLANT
ELECT REM PT RETURN 9FT ADLT (ELECTROSURGICAL) ×1
ELECTRODE REM PT RTRN 9FT ADLT (ELECTROSURGICAL) ×1 IMPLANT
GLOVE BIOGEL PI IND STRL 9 (GLOVE) ×1 IMPLANT
GLOVE SURG ORTHO 9.0 STRL STRW (GLOVE) ×1 IMPLANT
GOWN STRL REUS W/ TWL XL LVL3 (GOWN DISPOSABLE) ×2 IMPLANT
GOWN STRL REUS W/TWL XL LVL3 (GOWN DISPOSABLE) ×2
GRAFT SKIN WND MICRO 38 (Tissue) IMPLANT
KIT BASIN OR (CUSTOM PROCEDURE TRAY) ×1 IMPLANT
KIT DRSG PREVENA PLUS 7DAY 125 (MISCELLANEOUS) IMPLANT
KIT TURNOVER KIT B (KITS) ×1 IMPLANT
MANIFOLD NEPTUNE II (INSTRUMENTS) ×1 IMPLANT
NS IRRIG 1000ML POUR BTL (IV SOLUTION) ×1 IMPLANT
PACK GENERAL/GYN (CUSTOM PROCEDURE TRAY) ×1 IMPLANT
PAD ARMBOARD 7.5X6 YLW CONV (MISCELLANEOUS) ×1 IMPLANT
PREVENA RESTOR ARTHOFORM 46X30 (CANNISTER) ×1 IMPLANT
STAPLER VISISTAT 35W (STAPLE) IMPLANT
SUT ETHILON 2 0 PSLX (SUTURE) ×2 IMPLANT
SUT SILK 2 0 (SUTURE)
SUT SILK 2-0 18XBRD TIE 12 (SUTURE) IMPLANT
SUT VIC AB 1 CT1 36 (SUTURE) IMPLANT
TOWEL GREEN STERILE (TOWEL DISPOSABLE) ×1 IMPLANT

## 2022-05-06 NOTE — H&P (Signed)
Adam James is an 51 y.o. male.   Chief Complaint: Dehiscence left transtibial amputation. HPI: The patient is a 51 year old gentleman who is seen today for concern of exposed bone to his left tibia.  He is status post below-knee amputation on the left he has been doing Dial soap cleansing he states while doing his wound care he noticed he could touch down to his tibia.   Past Medical History:  Diagnosis Date   Anemia    GI bleed; 03/13/11 EGD: relux esophagitis with ulceration/clot   CAD (coronary artery disease)    Cardiomyopathy (HCC)    CHF (congestive heart failure) (HCC)    CKD (chronic kidney disease)    Diabetic foot ulcer (HCC)    Dyslipidemia    Dyspnea    08/05/21- not recently.   Dysrhythmia    PVCs   Heart failure (HCC)    Hypertension    Myocardial infarction (HCC)    Type 2 diabetes mellitus (HCC)     Past Surgical History:  Procedure Laterality Date   AMPUTATION Left 04/30/2021   Procedure: LEFT FOOT 5TH RAY AMPUTATION APPLICATION OF A-CELL POWDER;  Surgeon: [name], MD;  [facility];  Service: Orthopedics;  Laterality: Left;   AMPUTATION Left 07/30/2021   Procedure: LEFT BELOW KNEE AMPUTATION;  Surgeon: [name], MD;  Location: [facility] OR;  Service: Orthopedics;  Laterality: Left;   APPENDECTOMY     APPLICATION OF WOUND VAC Left 03/03/2021   Procedure: APPLICATION OF WOUND VAC;  Surgeon: Tarry Kos, MD;  [facility];  Service: Orthopedics;  Laterality: Left;   APPLICATION OF WOUND VAC  03/07/2021   Procedure: APPLICATION OF WOUND VAC;  Surgeon: [name], MD;  Location: [facility] OR;  Service: Orthopedics;;   APPLICATION OF WOUND VAC  07/30/2021   Procedure: APPLICATION OF WOUND VAC;  Surgeon: [name], MD;  [facility];  Service: Orthopedics;;   CARDIAC CATHETERIZATION     COLON SURGERY     CORONARY ARTERY BYPASS GRAFT N/A 05/22/2021   Procedure: CORONARY ARTERY BYPASS GRAFTING (CABG) X 3 USING LEFT INTERNAL MAMMARY ARTERY AND RIGHT  ENDOSCOPIC GREATER SAPHENOUS VEIN CONDUITS;  Surgeon: Corliss Skains, MD;  [facility];  Service: Open Heart Surgery;  Laterality: N/A;   ENDOVEIN HARVEST OF GREATER SAPHENOUS VEIN Right 05/22/2021   Procedure: ENDOVEIN HARVEST OF GREATER SAPHENOUS VEIN;  Surgeon: Corliss Skains, MD;  [facility];  Service: Open Heart Surgery;  Laterality: Right;   ESOPHAGOGASTRODUODENOSCOPY (EGD) WITH PROPOFOL N/A 03/12/2021   Procedure: ESOPHAGOGASTRODUODENOSCOPY (EGD) WITH PROPOFOL;  Surgeon: Jenel Lucks, MD;  Location: [facility] ENDOSCOPY;  Service: Gastroenterology;  Laterality: N/A;   I & D EXTREMITY Left 02/24/2021   Procedure: IRRIGATION AND DEBRIDEMENT EXTREMITY;  Surgeon: Myrene Galas, MD;  Location: [facility] OR;  Service: Orthopedics;  Laterality: Left;   I & D EXTREMITY Left 02/26/2021   Procedure: IRRIGATION AND DEBRIDEMENT OF LEG;  Surgeon: [name], MD;  Location: [facility] OR;  Service: Orthopedics;  Laterality: Left;   I & D EXTREMITY Left 03/03/2021   Procedure: LEFT LEG IRRIGATION AND DEBRIDEMENT;  Surgeon: Tarry Kos, MD;  [facility];  Service: Orthopedics;  Laterality: Left;   I & D EXTREMITY Left 03/07/2021   Procedure: REPEAT DEBRIDEMENT LEFT LEG;  Surgeon: [name], MD;  Location: [facility] OR;  Service: Orthopedics;  Laterality: Left;   IR FLUORO GUIDE CV LINE RIGHT  03/10/2021   IR REMOVAL TUN  CV CATH W/O FL  03/24/2021   IR US GUIDE VASC ACCESS RIGHT  03/10/2021   LEFT HEART CATH AND CORONARY ANGIOGRAPHY N/A 09/13/2020   Procedure: LEFT HEART CATH AND CORONARY ANGIOGRAPHY;  Surgeon: Troy Sine, MD;  Location: [place] CV LAB;  Service: Cardiovascular;  Laterality: N/A;   LEFT HEART CATH AND CORONARY ANGIOGRAPHY N/A 03/28/2021   Procedure: LEFT HEART CATH AND CORONARY ANGIOGRAPHY;  Surgeon: Larey Dresser, MD;  Location: [place] CV LAB;  Service: Cardiovascular;  Laterality: N/A;   RIGHT HEART CATH N/A 03/26/2021   Procedure: RIGHT HEART CATH;  Surgeon: Larey Dresser, MD;  Location: [place] CV LAB;  Service: Cardiovascular;  Laterality: N/A;   TEE WITHOUT CARDIOVERSION N/A 05/22/2021   Procedure: TRANSESOPHAGEAL ECHOCARDIOGRAM (TEE);  Surgeon: Lajuana Matte, MD;  Location: [place];  Service: Open Heart Surgery;  Laterality: N/A;   TOE AMPUTATION  2020    Family History  Problem Relation Age of Onset   Hypertension Mother    Heart failure Father    Hypertension Brother    Congestive Heart Failure Brother    Social History:  reports that he quit smoking about 20 months ago. His smoking use included cigarettes. He has a 108.00 pack-year smoking history. He has never used smokeless tobacco. He reports that he does not currently use alcohol. He reports that he does not currently use drugs after having used the following drugs: Marijuana.  Allergies:  Allergies  Allergen Reactions   Morphine Itching    No medications prior to admission.    No results found for this or any previous visit (from the past 48 hour(s)). No results found.  Review of Systems  All other systems reviewed and are negative.   Height 6\' 3"  (1.905 m), weight 106.6 kg. Physical Exam  Patient is alert, oriented, no adenopathy, well-dressed, normal affect, normal respiratory effort. On examination of the left residual limb there is a nickel sized ulcer over the distal tibia from end bearing this does probe to bone.  There is no surrounding erythema no drainage no odor Assessment/Plan Assessment: Dehiscence left transtibial amputation.  Plan: We will plan for revision left transtibial amputation.  Risk and benefits were discussed including infection neurovascular injury recurrent dehiscence need for additional surgery.  Patient states he understands wished to proceed at this time.  Newt Minion, MD 05/06/2022, 6:47 AM

## 2022-05-06 NOTE — Progress Notes (Signed)
Orthopedic Tech Progress Note Patient Details:  Adam James 09/03/1970 [id number]  Called in order to [employer] for a VIVE PROTOCOL   Patient ID: Adam James, male   DOB: [birth year], 51 y.o.   MRN: [id number]  [name] 05/06/2022, 10:26 AM

## 2022-05-06 NOTE — Op Note (Signed)
05/06/2022  10:39 AM  PATIENT:  Adam James    PRE-OPERATIVE DIAGNOSIS:  Osteomyelitis Left Tibia  POST-OPERATIVE DIAGNOSIS:  Same  PROCEDURE:  REVISION LEFT BELOW KNEE AMPUTATION Application of Kerecis micro powder 38 cm. Application of Prevena customizable and Arnell Sieving form wound VAC. SURGEON:  Newt Minion, MD  PHYSICIAN ASSISTANT:None ANESTHESIA:   General  PREOPERATIVE INDICATIONS:  [name] is a  51 y.o. male with a diagnosis of Osteomyelitis Left Tibia who failed conservative measures and elected for surgical management.    The risks benefits and alternatives were discussed with the patient preoperatively including but not limited to the risks of infection, bleeding, nerve injury, cardiopulmonary complications, the need for revision surgery, among others, and the patient was willing to proceed.  OPERATIVE IMPLANTS: Kerecis micro powder 38 cm to cover surface area of 200 cm.  @ENCIMAGES @  OPERATIVE FINDINGS: Tissue margins had good healthy bleeding there was no abscess.  OPERATIVE PROCEDURE: Patient brought the operating room and underwent a general anesthetic.  After adequate levels anesthesia were obtained patient's left lower extremity was prepped using DuraPrep draped into a sterile field a timeout was called.  A fishmouth incision was made around the ulcerative tissue this was carried sharply down to bone.  A reciprocating saw was used to resect the distal 2 cm of tibia this was beveled anteriorly.  Electrocautery was used for hemostasis.  The wound was irrigated with normal saline.  The soft tissue was reinforced with 38 cm of Kerecis micro powder for a wound surface area of 200 cm.  Drill holes were placed in the tibia and the fascial reconstruction was performed with fascial advancement and secured through the drill holes in the tibia.  The skin was closed using staples the Praveena customizable and Arnell Sieving form wound VAC was applied this had a good suction  fit patient was placed in the stump shrinker and the limb protector extubated taken the PACU in stable condition.   DISCHARGE PLANNING:  Antibiotic duration: Preoperative antibiotics  Weightbearing: Nonweightbearing on the left  Pain medication: Prescription for Vicodin  Dressing care/ Wound VAC: Continue wound VAC for 1 week  Ambulatory devices: Wheelchair  Discharge to: Home.  Follow-up: In the office 1 week post operative.

## 2022-05-06 NOTE — Transfer of Care (Signed)
Immediate Anesthesia Transfer of Care Note  Patient: Adam James  Procedure(s) Performed: REVISION LEFT BELOW KNEE AMPUTATION (Left)  Patient Location: PACU  Anesthesia Type:General  Level of Consciousness: awake and patient cooperative  Airway & Oxygen Therapy: Patient Spontanous Breathing  Post-op Assessment: Report given to RN and Post -op Vital signs reviewed and stable  Post vital signs: Reviewed and stable  Last Vitals:  Vitals Value Taken Time  BP 133/86 05/06/22 1034  Temp    Pulse 79 05/06/22 1036  Resp 12 05/06/22 1036  SpO2 96 % 05/06/22 1036  Vitals shown include unvalidated device data.  Last Pain:  Vitals:   05/06/22 0908  TempSrc:   PainSc: 0-No pain         Complications: No notable events documented.

## 2022-05-06 NOTE — Anesthesia Procedure Notes (Signed)
Procedure Name: LMA Insertion Date/Time: 05/06/2022 9:53 AM  Performed by: Gwyndolyn Saxon, CRNAPre-anesthesia Checklist: Patient identified, Emergency Drugs available, Suction available and Patient being monitored Patient Re-evaluated:Patient Re-evaluated prior to induction Oxygen Delivery Method: Circle system utilized Preoxygenation: Pre-oxygenation with 100% oxygen Induction Type: IV induction Ventilation: Mask ventilation without difficulty LMA: LMA inserted LMA Size: 5.0 Number of attempts: 1 Placement Confirmation: positive ETCO2 and breath sounds checked- equal and bilateral Tube secured with: Tape Dental Injury: Teeth and Oropharynx as per pre-operative assessment  Comments: Pt with loose tooth bottom center; intact after LMA insertion

## 2022-05-06 NOTE — Interval H&P Note (Signed)
History and Physical Interval Note:  05/06/2022 9:32 AM  Adam James  has presented today for surgery, with the diagnosis of Osteomyelitis Left Tibia.  The various methods of treatment have been discussed with the patient and family. After consideration of risks, benefits and other options for treatment, the patient has consented to  Procedure(s): REVISION LEFT BELOW KNEE AMPUTATION (Left) as a surgical intervention.  The patient's history has been reviewed, patient examined, no change in status, stable for surgery.  I have reviewed the patient's chart and labs.  Questions were answered to the patient's satisfaction.     Newt Minion

## 2022-05-07 ENCOUNTER — Encounter (HOSPITAL_COMMUNITY): Payer: Self-pay | Admitting: Orthopedic Surgery

## 2022-05-07 ENCOUNTER — Other Ambulatory Visit: Payer: Self-pay

## 2022-05-07 NOTE — Anesthesia Postprocedure Evaluation (Signed)
Anesthesia Post Note  Patient: Adam James  Procedure(s) Performed: REVISION LEFT BELOW KNEE AMPUTATION (Left)     Patient location during evaluation: PACU Anesthesia Type: General Level of consciousness: awake and alert Pain management: pain level controlled Vital Signs Assessment: post-procedure vital signs reviewed and stable Respiratory status: spontaneous breathing, nonlabored ventilation, respiratory function stable and patient connected to nasal cannula oxygen Cardiovascular status: blood pressure returned to baseline and stable Postop Assessment: no apparent nausea or vomiting Anesthetic complications: no   No notable events documented.  Last Vitals:  Vitals:   05/06/22 1050 05/06/22 1105  BP: (!) 147/87 (!) 146/98  Pulse: 79 80  Resp: 11 16  Temp:  36.7 C  SpO2: 95% 96%    Last Pain:  Vitals:   05/06/22 1105  TempSrc:   PainSc: [place]

## 2022-05-08 ENCOUNTER — Other Ambulatory Visit: Payer: Self-pay

## 2022-05-08 ENCOUNTER — Other Ambulatory Visit (HOSPITAL_COMMUNITY): Payer: Medicaid Other

## 2022-05-12 ENCOUNTER — Other Ambulatory Visit: Payer: Self-pay

## 2022-05-13 ENCOUNTER — Ambulatory Visit: Payer: Medicaid Other

## 2022-05-13 NOTE — Progress Notes (Signed)
Patient is s/p a left BKA revision with kerecis graft on 05/06/2022. He comes in today for vac removal. He is wearing his limb protector and shrinker. The vac was removed and there was no drainage in the canister. The incision is well approximated and staples are intact. The patient was advised to apply the shrinker with direct contact to the skin and was given gauze and ace bandage to use on the outside of the shrinker for any possible drainage. He is to change this daily. The patient voiced understanding and  will follow up in the office in 2 weeks or sooner should he have any concerns.   Elenna Spratling, [place], [employer]

## 2022-05-14 ENCOUNTER — Other Ambulatory Visit (HOSPITAL_COMMUNITY): Payer: Self-pay

## 2022-05-18 NOTE — Telephone Encounter (Signed)
Advanced Heart Failure Patient Advocate Encounter  [employer] has requested POI before making a determination. Spoke to patient on the phone, he has already requested an Attestation Letter to confirm that there is no income.  He has not received the letter at this time, but will let A[facility] know if assistance is needed with faxing/ returning the paperwork.  Clista Bernhardt, CPhT Rx Patient Advocate Phone: [phone]

## 2022-05-20 ENCOUNTER — Inpatient Hospital Stay (HOSPITAL_COMMUNITY): Admission: RE | Admit: 2022-05-20 | Payer: Medicaid Other | Source: Ambulatory Visit

## 2022-05-20 ENCOUNTER — Other Ambulatory Visit: Payer: Self-pay

## 2022-05-22 ENCOUNTER — Other Ambulatory Visit: Payer: Self-pay

## 2022-05-25 ENCOUNTER — Other Ambulatory Visit: Payer: Self-pay

## 2022-05-26 ENCOUNTER — Other Ambulatory Visit: Payer: Self-pay

## 2022-05-27 ENCOUNTER — Encounter: Payer: Self-pay | Admitting: Family

## 2022-05-27 ENCOUNTER — Ambulatory Visit (INDEPENDENT_AMBULATORY_CARE_PROVIDER_SITE_OTHER): Payer: Self-pay | Admitting: Family

## 2022-05-27 DIAGNOSIS — T8781 Dehiscence of amputation stump: Secondary | ICD-10-CM

## 2022-05-27 NOTE — Progress Notes (Signed)
Post-Op Visit Note   Patient: Adam James           Date of Birth: 1971/07/01           MRN: [id number] Visit Date: 05/27/2022 PCP: Charlott Rakes, MD  Chief Complaint: No chief complaint on file.   HPI:  HPI The patient is a 51 year old gentleman who is seen status post revision left below-knee amputation Ortho Exam On examination of the left residual limb this is well approximated staples healing well there is no gaping no drainage no sign of infection consolidating well  Visit Diagnoses: No diagnosis found.  Plan: Staples harvested.  Continue shrinker daily dose of cleansing continue limb protector.  Order faxed to [facility] for prosthesis set up pending his Medicaid approval.  Follow-Up Instructions: No follow-ups on file.   Imaging: No results found.  Orders:  No orders of the defined types were placed in this encounter.  No orders of the defined types were placed in this encounter.    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   Acute exacerbation of CHF (congestive heart failure) ([place]) 04/28/2021   Venous stasis ulcer of right calf limited to breakdown of skin without varicose veins (HCC)    Acute on chronic combined systolic and diastolic CHF (congestive heart failure) (HCC)    Acute on chronic heart failure ([place]) 03/23/2021   NSTEMI (non-ST elevated myocardial infarction) ([place])    Gastroesophageal reflux disease with esophagitis and hemorrhage    Anemia, posthemorrhagic, acute    Acute esophagitis    Gastrointestinal hemorrhage    Necrotizing fasciitis (HCC)     Diabetic polyneuropathy associated with type 2 diabetes mellitus (HCC)    Severe protein-calorie malnutrition (HCC)    Type 2 diabetes mellitus ([place])    Heart failure (HCC)    Dyslipidemia    CAD (coronary artery disease)    Cardiomyopathy ([place])    Coronary artery disease involving native coronary artery of native heart with angina pectoris [facility])    Past Medical History:  Diagnosis Date   Anemia    GI bleed; 03/13/11 EGD: relux esophagitis with ulceration/clot   CAD (coronary artery disease)    Cardiomyopathy (HCC)    CHF (congestive heart failure) (HCC)    CKD (chronic kidney disease)    Diabetic foot ulcer ([place])    Dyslipidemia    Dyspnea    08/05/21- not recently.   Dysrhythmia    PVCs   Heart failure (HCC)    Hypertension    Myocardial infarction ([place])    Type 2 diabetes mellitus ([place])     Family History  Problem Relation Age of Onset   Hypertension Mother    Heart failure Father    Hypertension Brother    Congestive Heart Failure Brother     Past Surgical History:  Procedure Laterality Date   AMPUTATION Left 04/30/2021   Procedure: LEFT FOOT 5TH RAY AMPUTATION APPLICATION OF A-CELL POWDER;  Surgeon: Newt Minion, MD;  Location: [place];  Service: Orthopedics;  Laterality: Left;   AMPUTATION Left 07/30/2021   Procedure: LEFT BELOW KNEE AMPUTATION;  Surgeon: Sharol Given,  Illene Regulus, MD;  Location: [place];  Service: Orthopedics;  Laterality: Left;   APPENDECTOMY     APPLICATION OF WOUND VAC Left 03/03/2021   Procedure: APPLICATION OF WOUND VAC;  Surgeon: Leandrew Koyanagi, MD;  Location: [place];  Service: Orthopedics;  Laterality: Left;   APPLICATION OF WOUND VAC  03/07/2021   Procedure: APPLICATION OF WOUND VAC;  Surgeon: Newt Minion, MD;  Location: [place];  Service: Orthopedics;;   APPLICATION OF WOUND VAC  07/30/2021   Procedure: APPLICATION OF WOUND VAC;  Surgeon: Newt Minion, MD;  Location: [place];  Service: Orthopedics;;   CARDIAC CATHETERIZATION     COLON SURGERY     CORONARY  ARTERY BYPASS GRAFT N/A 05/22/2021   Procedure: CORONARY ARTERY BYPASS GRAFTING (CABG) X 3 USING LEFT INTERNAL MAMMARY ARTERY AND RIGHT ENDOSCOPIC GREATER SAPHENOUS VEIN CONDUITS;  Surgeon: Lajuana Matte, MD;  Location: [place];  Service: Open Heart Surgery;  Laterality: N/A;   ENDOVEIN HARVEST OF GREATER SAPHENOUS VEIN Right 05/22/2021   Procedure: ENDOVEIN HARVEST OF GREATER SAPHENOUS VEIN;  Surgeon: Lajuana Matte, MD;  Location: [place];  Service: Open Heart Surgery;  Laterality: Right;   ESOPHAGOGASTRODUODENOSCOPY (EGD) WITH PROPOFOL N/A 03/12/2021   Procedure: ESOPHAGOGASTRODUODENOSCOPY (EGD) WITH PROPOFOL;  Surgeon: Daryel November, MD;  Location: [place];  Service: Gastroenterology;  Laterality: N/A;   I & D EXTREMITY Left 02/24/2021   Procedure: IRRIGATION AND DEBRIDEMENT EXTREMITY;  Surgeon: Altamese Manchester, MD;  Location: [place];  Service: Orthopedics;  Laterality: Left;   I & D EXTREMITY Left 02/26/2021   Procedure: IRRIGATION AND DEBRIDEMENT OF LEG;  Surgeon: Newt Minion, MD;  Location: [place];  Service: Orthopedics;  Laterality: Left;   I & D EXTREMITY Left 03/03/2021   Procedure: LEFT LEG IRRIGATION AND DEBRIDEMENT;  Surgeon: Leandrew Koyanagi, MD;  Location: [place];  Service: Orthopedics;  Laterality: Left;   I & D EXTREMITY Left 03/07/2021   Procedure: REPEAT DEBRIDEMENT LEFT LEG;  Surgeon: Newt Minion, MD;  Location: [place];  Service: Orthopedics;  Laterality: Left;   IR FLUORO GUIDE CV LINE RIGHT  03/10/2021   IR REMOVAL TUN CV CATH W/O FL  03/24/2021   IR US GUIDE VASC ACCESS RIGHT  03/10/2021   LEFT HEART CATH AND CORONARY ANGIOGRAPHY N/A 09/13/2020   Procedure: LEFT HEART CATH AND CORONARY ANGIOGRAPHY;  Surgeon: Troy Sine, MD;  Location: [place] CV LAB;  Service: Cardiovascular;  Laterality: N/A;   LEFT HEART CATH AND CORONARY ANGIOGRAPHY N/A 03/28/2021   Procedure: LEFT HEART CATH AND CORONARY ANGIOGRAPHY;  Surgeon: Larey Dresser, MD;  Location: [place]  CV LAB;  Service: Cardiovascular;  Laterality: N/A;   RIGHT HEART CATH N/A 03/26/2021   Procedure: RIGHT HEART CATH;  Surgeon: Larey Dresser, MD;  Location: [place] CV LAB;  Service: Cardiovascular;  Laterality: N/A;   STUMP REVISION Left 05/06/2022   Procedure: REVISION LEFT BELOW KNEE AMPUTATION;  Surgeon: Newt Minion, MD;  Location: [place];  Service: Orthopedics;  Laterality: Left;   TEE WITHOUT CARDIOVERSION N/A 05/22/2021   Procedure: TRANSESOPHAGEAL ECHOCARDIOGRAM (TEE);  Surgeon: Lajuana Matte, MD;  Location: [place];  Service: Open Heart Surgery;  Laterality: N/A;   TOE AMPUTATION  2020   Social History   Occupational History   Occupation: none    Comment: former "[employer]"  Tobacco Use   Smoking status: Former    Packs/day: 3.00    Years: 36.00  Total pack years: 108.00    Types: Cigarettes    Quit date: 08/27/2020    Years since quitting: 1.7   Smokeless tobacco: Never  Vaping Use   Vaping Use: Never used  Substance and Sexual Activity   Alcohol use: Not Currently    Comment: Very rare   Drug use: Not Currently    Types: Marijuana   Sexual activity: Not on file

## 2022-06-02 NOTE — Telephone Encounter (Signed)
Advanced Heart Failure Patient Surveyor, minerals for update, pt has not submitted attestation letter of no income. Representative said they will mail a new copy to the patient, should arrive in 7-10 business days.

## 2022-06-11 ENCOUNTER — Other Ambulatory Visit: Payer: Self-pay

## 2022-06-15 ENCOUNTER — Other Ambulatory Visit: Payer: Self-pay

## 2022-06-15 ENCOUNTER — Other Ambulatory Visit (HOSPITAL_COMMUNITY): Payer: Self-pay | Admitting: Cardiology

## 2022-06-15 ENCOUNTER — Other Ambulatory Visit (HOSPITAL_COMMUNITY): Payer: Self-pay

## 2022-06-15 ENCOUNTER — Telehealth (HOSPITAL_COMMUNITY): Payer: Self-pay | Admitting: Licensed Clinical Social Worker

## 2022-06-15 MED ORDER — ATORVASTATIN CALCIUM 80 MG PO TABS
80.0000 mg | ORAL_TABLET | Freq: Every day | ORAL | 3 refills | Status: DC
  Filled 2022-06-15: qty 30, 30d supply, fill #0

## 2022-06-15 MED ORDER — DIGOXIN 125 MCG PO TABS
0.1250 mg | ORAL_TABLET | Freq: Every day | ORAL | 3 refills | Status: DC
  Filled 2022-06-15: qty 30, 30d supply, fill #0

## 2022-06-15 NOTE — Telephone Encounter (Signed)
H&V Care Navigation CSW Progress Note  Clinical Social Worker received call from pt requesting help obtaining medications.  CSW able to call pharmacy and help order for him- also went to CHW to pay for non heart failure meds.   SDOH Screenings   Food Insecurity: Food Insecurity Present (02/04/2022)  Housing: Low Risk  (11/06/2021)  Transportation Needs: No Transportation Needs (11/06/2021)  Alcohol Screen: Low Risk  (03/26/2021)  Depression (PHQ2-9): High Risk (03/31/2022)  Financial Resource Strain: High Risk (02/20/2022)  Tobacco Use: Medium Risk (05/27/2022)   Burna Sis, LCSW Clinical Social Worker Advanced Heart Failure Clinic Desk#: [phone] Cell#: [phone]

## 2022-06-16 ENCOUNTER — Other Ambulatory Visit: Payer: Self-pay

## 2022-06-17 ENCOUNTER — Other Ambulatory Visit: Payer: Self-pay

## 2022-06-24 NOTE — Telephone Encounter (Signed)
Advanced Heart Failure Patient Nurse, children's for update. This is being escalated for denial review and is still in process.  Patient may have new insurance coverage that will be active as of 12/1. Will update with [employer] determination, or active insurance.

## 2022-06-26 ENCOUNTER — Encounter: Payer: Medicaid Other | Admitting: Family

## 2022-06-26 ENCOUNTER — Telehealth (HOSPITAL_COMMUNITY): Payer: Self-pay

## 2022-06-26 ENCOUNTER — Other Ambulatory Visit (HOSPITAL_COMMUNITY): Payer: Self-pay

## 2022-06-26 DIAGNOSIS — Z419 Encounter for procedure for purposes other than remedying health state, unspecified: Secondary | ICD-10-CM | POA: Diagnosis not present

## 2022-06-26 NOTE — Progress Notes (Incomplete)
Advanced Heart Failure Clinic Note    PCP: Charlott Rakes, MD PCP-Cardiologist: Shirlee More, MD  HF Cardiologist: Dr. Aundra Dubin  HPI: [name] is a 51 y.o.with a history CAD, Had PCI 2007 in [place]. 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   Admitted to [facility] in February 2022 with lower extremity edema and foot ulcer. BNP and HS Trop elevated and felt to be non-ACS.  Cardiology consulted and he was placed on lasix.    He had follow up with Dr Bettina Gavia 09/09/20 and was set up for cath. Cath with multivessel CAD. Saw Dr Kipp Brood in the office with plans for CMRI. He was referred to SW due to financial concerns and no payor source. CMRI showed EF 40% and viable myocardium.     Saw Dr Kipp Brood in July 2022 and was set up for CABG in August. Pre CABG labs completed and showed WBC 25. He was advised to go to the ED.    Admitted 02/25/2021 with left foot infection complicated by osteomyelitis and necrotizing fasciitis. 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 Hospital course complicated by erosive esophagitis on EGD. Recommendations for protonix 40 mg bid + repeat EGD in 8 weeks. Received multiple transfusions. Discharged 03/15/21.    Admitted 03/23/21 with A/C CHF exacerbation. Started on IV lasix. Cardiology consulted. CO-OX 55%. Given 80 mg IV x1. V lasix increased to 100 mg tid and started on metolazone.  Repeat echo on 03/23/21 with LVEF of 25%, LV demonstrating global hypokinesis, and LV diastolic parameters consistent with grade II diastolic dysfunction. RHC performed 03/26/21 showed preserved cardiac output, mildly elevated PCWP, and mild pulmonary venous hypertension. LHC performed on 03/28/21 showed  minimal change from prior study in 2/22. Ost LAD to Mid LAD 90% stenosed, mid LAD to Dist LAD 90% stenosed, dist LAD lesion 100%  stenosed.prox Cx 70% stenosed, prox RCA to Mid RCA 100% stenosed, dist RCA 100% stenosed, ramus 70% stenosed, 1st Mrg 50% stenosed, mid LM to Dist LM 60% stenosed. Hospitalization c/b AKI on CKD 3.   After full treatment of foot infection, he was admitted in 10/22 for CABG with LIMA-LAD, SVG-OM1, SVG-OM3.  PDA too small to graft.   Underwent L BKA 1/23 due to osteomyelitis and abscess.  Admitted 4/23 with acute on chronic CHF. Diuresed with IV lasix. Echo in 4/23 showed EF 20-25%, RV moderately reduced, mild to moderate MR. GDMT restarted. Discharged home, weight 209 lbs. Refused paramedicine and PT/OT.  Echo was done today and reviewed, EF 30%, septal hypokinesis with septal-lateral dyssynchrony, mildly decreased RV systolic function, IVC normal.   Today he returns for HF follow up with his daughter.  He still has a persistent wound on his left stump that is slowly healing.  He uses a wheelchair generally but can maneuver a walker for short distances. No dyspnea or chest pain with his usual activities.  No orthopnea/PND.  He is not smoking. No palpitations or  lightheadedness.                                                             ECG (personally reviewed): NSR, LVH with repolarization abnormality.   Labs (11/22): K 4.2, creatinine 1.38 Labs (12/22): K 4.4,  creatinine 1.94 Labs (4/23): K 4.2, creatinine 2.1, LDL 112 Labs (5/23): K 4.7 => 5.3, creatinine 1.6 => 1.34 Labs (9/23): TSH nl, K 5.3, creatinine 1.96, LFTs normal  PMH: 1. GI bleeding: Erosive esophagitis 8/22.  2. CKD stage 3 3. Type 2 diabetes 4. Hyperlipidemia 5. Left foot/leg diabetic infection with necrotizing fasciitis: 8/22, debridements were done and the left 5th metatarsal was amputated.  1/23 had left BKA.  6. Prior smoker 7. H/o PVC, VT: On amiodarone.  8. CAD: LHC in 9/22 with severe 3 vessel disease.  - CABG (10/22) with LIMA-LAD, SVG-OM2, SVG-OM3.  9. Chronic systolic CHF: Ischemic cardiomyopathy.  - Echo  (8/22): LVEF of 25%, LV demonstrating global hypokinesis, and LV diastolic parameters consistent with grade II diastolic dysfunction.  - Echo (4/23): EF 20-25%, severe LV dysfunction, RV moderately reduced, RVSP 46.8 mmHg, mild to moderate MR - Echo (10/23): EF 30%, septal hypokinesis with septal-lateral dyssynchrony, mildly decreased RV systolic function, IVC normal.   Review of Systems: All systems reviewed and negative except as per HPI.   Current Outpatient Medications  Medication Sig Dispense Refill   albuterol (VENTOLIN HFA) 108 (90 Base) MCG/ACT inhaler Inhale 2 puffs into the lungs every 4 (four) hours as needed for shortness of breath or wheezing.     amiodarone (PACERONE) 200 MG tablet Take 0.5 tablets (100 mg total) by mouth daily. 30 tablet 3   amoxicillin-clavulanate (AUGMENTIN) 500-125 MG tablet Take 1 tablet (500 mg total) by mouth 3 (three) times daily. (Patient not taking: Reported on 03/31/2022) 30 tablet 0   aspirin EC 81 MG tablet Take 1 tablet (81 mg total) by mouth daily. 30 tablet 11   atorvastatin (LIPITOR) 80 MG tablet Take 1 tablet (80 mg total) by mouth daily. 30 tablet 3   carvedilol (COREG) 3.125 MG tablet Take 1 tablet (3.125 mg total) by mouth 2 (two) times daily. 180 tablet 3   diclofenac Sodium (VOLTAREN) 1 % GEL Apply 4 g topically 4 (four) times daily. (Patient not taking: Reported on 04/28/2022) 100 g 1   digoxin (LANOXIN) 0.125 MG tablet Take 1 tablet (0.125 mg total) by mouth daily. 30 tablet 3   doxycycline (VIBRA-TABS) 100 MG tablet Take 1 tablet (100 mg total) by mouth 2 (two) times daily. 28 tablet 0   empagliflozin (JARDIANCE) 10 MG TABS tablet Take 1 tablet (10 mg total) by mouth daily. 30 tablet 3   furosemide (LASIX) 40 MG tablet Take 0.5 tablets (20 mg total) by mouth daily. 30 tablet 3   gabapentin (NEURONTIN) 300 MG capsule Take 1 capsule (300 mg total) by mouth at bedtime. 90 capsule 1   glipiZIDE (GLUCOTROL) 5 MG tablet Take 1 tablet (5 mg total) by  mouth daily before breakfast. 30 tablet 2   glucose blood (TRUE METRIX BLOOD GLUCOSE TEST) test strip Use as instructed twice daily 100 each 12   HYDROcodone-acetaminophen (NORCO/VICODIN) 5-325 MG tablet Take 1 tablet by mouth every 4 (four) hours as needed. 30 tablet 0   sacubitril-valsartan (ENTRESTO) 24-26 MG Take 1 tablet by mouth 2 (two) times daily. 180 tablet 3   Semaglutide,0.25 or 0.5MG /DOS, (OZEMPIC, 0.25 OR 0.5 MG/DOSE,) 2 MG/3ML SOPN Inject 0.25 mg into the skin once a week. (Patient taking differently: Inject 0.25 mg into the skin once a week. Takes on Wednesday) 3 mL 6   TRUEplus Lancets 28G MISC Use as directed twice daily at 8 am and 10 pm. 100 each 11   venlafaxine XR (EFFEXOR-XR) 75 MG  24 hr capsule Take 1 capsule (75 mg total) by mouth daily with breakfast. 30 capsule 2   No current facility-administered medications for this visit.   Allergies  Allergen Reactions   Morphine Itching   Social History   Socioeconomic History   Marital status: Single    Spouse name: Not on file   Number of children: 1   Years of education: Not on file   Highest education level: 8th grade  Occupational History   Occupation: none    Comment: former "[employer]"  Tobacco Use   Smoking status: Former    Packs/day: 3.00    Years: 36.00    Total pack years: 108.00    Types: Cigarettes    Quit date: 08/27/2020    Years since quitting: 1.8   Smokeless tobacco: Never  Vaping Use   Vaping Use: Never used  Substance and Sexual Activity   Alcohol use: Not Currently    Comment: Very rare   Drug use: Not Currently    Types: Marijuana   Sexual activity: Not on file  Other Topics Concern   Not on file  Social History Narrative   Not on file   Social Determinants of Health   Financial Resource Strain: High Risk (02/20/2022)   Overall Financial Resource Strain (CARDIA)    Difficulty of Paying Living Expenses: Hard  Food Insecurity: Food Insecurity Present (02/04/2022)   Hunger Vital Sign     Worried About Running Out of Food in the Last Year: Sometimes true    Ran Out of Food in the Last Year: Sometimes true  Transportation Needs: No Transportation Needs (11/06/2021)   PRAPARE - Hydrologist (Medical): No    Lack of Transportation (Non-Medical): No  Physical Activity: Not on file  Stress: Not on file  Social Connections: Not on file  Intimate Partner Violence: Not on file   Family History  Problem Relation Age of Onset   Hypertension Mother    Heart failure Father    Hypertension Brother    Congestive Heart Failure Brother    There were no vitals taken for this visit.  Wt Readings from Last 3 Encounters:  05/06/22 106.6 kg (235 lb)  12/24/21 97.7 kg (215 lb 6.4 oz)  11/19/21 89.7 kg (197 lb 12.8 oz)   PHYSICAL EXAM: General: NAD Neck: No JVD, no thyromegaly or thyroid nodule.  Lungs: Clear to auscultation bilaterally with normal respiratory effort. CV: Nondisplaced PMI.  Heart regular S1/S2, no S3/S4, no murmur.  No peripheral edema.  No carotid bruit.  Difficult to palpate right pedal pulses.  Abdomen: Soft, nontender, no hepatosplenomegaly, no distention.  Skin: Intact without lesions or rashes.  Neurologic: Alert and oriented x 3.  Psych: Normal affect. Extremities: No clubbing or cyanosis. Left BKA.  HEENT: Normal.   ASSESSMENT & PLAN: 1. Chronic systolic CHF: Ischemic cardiomyopathy.  Initial echo in 2/22 with EF 40-45%.  Echo in 7/22 with EF 30-35%.  Echo 8/22 EF down to 25% with mild RV dysfunction and dilated IVC.  [place] 8/22 with preserved CO, mildly elevated PCWP, mild pulmonary hypertension. S/p CABG 10/22. Readmitted 4/23 with CHF, had been off diuretics and missed follow up. Echo in 4/23 with EF 20-25%, RV moderately reduced, mild to moderate MR. Echo today showed EF 30%, septal hypokinesis with septal-lateral dyssynchrony, mildly decreased RV systolic function, IVC normal.  NYHA II though functional class difficult due to L  BKA.  He is not volume overloaded on exam.  -  Decrease Lasix to 20 mg daily.  - Start Entresto 24/26 bid, BMET/BNP today and BMET in 10 days.  If K is high, would add Lokelma to bind.  - Continue Jardiance 10 mg daily. - He is not taking eplerenone, will not start until I see that K is controlled (mildly high on 9/23 BMET).  - Continue digoxin 0.125 mg daily. Check digoxin level today.  - Continue Coreg 3.125 mg bid.  - He qualifies for an ICD but not CRT with narrow QRS.  He agreeable to ICD, but I think that his open stump wound needs to heal first due to infectious risk.  2. CAD: Hx PCI 2007. S/p CABG x 3 with LIMA-LAD, SVG-OM2, and SVG-OM3 10/22. No chest pain.  - Continue ASA 81 daily.  - Continue atorvastatin 80 mg daily. 3. PVCs/VT: Noted 10/22 post CABG.  - Continue amiodarone 100 mg daily.  Check TSH and LFTs, he will need regular eye exam.  4. CKD stage 3: BMET today.  5. GI bleeding: History of erosive esophagitis earlier in 8/22.  He is now on Protonix.   6. Left leg diabetic foot infection: Underwent left BKA 1/23. Still with open wound at stump.  - Followup with with Dr. Sharol Given.  - He needs to eventually be fitted with prosthesis.  7. DM2:  PCP managing.  Followup with APP in 2 months.    [place] 06/26/22

## 2022-06-26 NOTE — Telephone Encounter (Signed)
Advanced Heart Failure Patient Advocate Encounter  As of 06/26/2022, this patient has active Medicaid coverage and this will effect the patients needs for medication assistance.  Coverage information has been added to [facility].  Stephaine H, CPhT Rx Patient Advocate Phone: [phone] 

## 2022-06-29 ENCOUNTER — Encounter (HOSPITAL_COMMUNITY): Payer: Medicaid Other

## 2022-06-29 NOTE — Telephone Encounter (Signed)
Advanced Heart Failure Patient Advocate Encounter  New coverage is active and has been added to [facility]. This will effect the patients need for assistance programs.

## 2022-07-14 ENCOUNTER — Other Ambulatory Visit: Payer: Self-pay

## 2022-07-21 ENCOUNTER — Other Ambulatory Visit: Payer: Self-pay

## 2022-07-27 DIAGNOSIS — Z419 Encounter for procedure for purposes other than remedying health state, unspecified: Secondary | ICD-10-CM | POA: Diagnosis not present

## 2022-08-05 ENCOUNTER — Other Ambulatory Visit (HOSPITAL_COMMUNITY): Payer: Self-pay

## 2022-08-06 ENCOUNTER — Other Ambulatory Visit: Payer: Self-pay

## 2022-08-12 ENCOUNTER — Encounter: Payer: Self-pay | Admitting: Family

## 2022-08-12 ENCOUNTER — Ambulatory Visit (INDEPENDENT_AMBULATORY_CARE_PROVIDER_SITE_OTHER): Payer: Medicaid Other | Admitting: Family

## 2022-08-12 DIAGNOSIS — T8781 Dehiscence of amputation stump: Secondary | ICD-10-CM

## 2022-08-12 NOTE — Progress Notes (Addendum)
Post-Op Visit Note   Patient: Adam ParrMichael Vigilante           Date of Birth: 07/11/1971           MRN: [id number] Visit Date: 08/12/2022 PCP: Hoy RegisterNewlin, Enobong, MD  Chief Complaint:  Chief Complaint  Patient presents with   Left Leg - Follow-up    HPI:  HPI The patient is a 52 year old gentleman who presents in postoperative follow-up status post revision left below-knee amputation October 11  States he has recently gotten his Medicaid and hopes to be able to proceed with prosthesis set up  Patient is a new left transtibial  amputee.  Patient's current comorbidities are not expected to impact the ability to function with the prescribed prosthesis. Patient verbally communicates a strong desire to use a prosthesis. Patient currently requires mobility aids to ambulate without a prosthesis.  Expects not to use mobility aids with a new prosthesis.  Patient is a K2 level ambulator that will use a prosthesis to walk around their home and the community over low level environmental barriers.      Ortho Exam On examination of the left residual limb this is well consolidated well-healed there is no impending skin breakdown  Visit Diagnoses: No diagnosis found.  Plan: Proceed with prosthesis set up.  Continue shrinker.  Follow-up as needed.  Given an order for his handicap parking placard today.  Follow-Up Instructions: No follow-ups on file.   Imaging: No results found.  Orders:  No orders of the defined types were placed in this encounter.  No orders of the defined types were placed in this encounter.    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   Acute exacerbation of CHF (congestive heart failure) (HCC) 04/28/2021   Venous stasis ulcer of right calf limited to breakdown of skin without varicose veins (HCC)    Acute on chronic combined systolic and diastolic CHF (congestive heart failure) (HCC)    Acute on chronic heart failure (HCC) 03/23/2021   NSTEMI (non-ST elevated myocardial infarction) (HCC)    Gastroesophageal reflux disease with esophagitis and hemorrhage    Anemia, posthemorrhagic, acute    Acute esophagitis    Gastrointestinal hemorrhage    Necrotizing fasciitis (HCC)    Diabetic polyneuropathy associated with type 2 diabetes mellitus (HCC)    Severe protein-calorie malnutrition (HCC)    Type 2 diabetes mellitus (HCC)    Heart failure (HCC)    Dyslipidemia    CAD (coronary artery disease)    Cardiomyopathy (HCC)    Coronary artery disease involving native coronary artery of native heart with angina pectoris [facility](HCC)    Past Medical History:  Diagnosis Date   Anemia    GI bleed; 03/13/11 EGD: relux esophagitis with ulceration/clot   CAD (coronary artery disease)    Cardiomyopathy (HCC)    CHF (congestive heart failure) (HCC)    CKD (chronic kidney disease)    Diabetic foot ulcer (HCC)    Dyslipidemia    Dyspnea    08/05/21- not recently.   Dysrhythmia    PVCs   Heart failure (HCC)  Hypertension    Myocardial infarction (HCC)    Type 2 diabetes mellitus (HCC)     Family History  Problem Relation Age of Onset   Hypertension Mother    Heart failure Father    Hypertension Brother    Congestive Heart Failure Brother     Past Surgical History:  Procedure Laterality Date   AMPUTATION Left 04/30/2021   Procedure: LEFT FOOT 5TH RAY AMPUTATION APPLICATION OF A-CELL POWDER;  Surgeon: Nadara Mustarduda, Marcus V, MD;  Location: [facility] OR;  Service: Orthopedics;  Laterality: Left;   AMPUTATION Left 07/30/2021   Procedure: LEFT BELOW KNEE AMPUTATION;  Surgeon: Nadara Mustarduda,  Marcus V, MD;  [facility];  Service: Orthopedics;  Laterality: Left;   APPENDECTOMY     APPLICATION OF WOUND VAC Left 03/03/2021   Procedure: APPLICATION OF WOUND VAC;  Surgeon: Tarry KosXu, Naiping M, MD;  Location: [facility] OR;  Service: Orthopedics;  Laterality: Left;   APPLICATION OF WOUND VAC  03/07/2021   Procedure: APPLICATION OF WOUND VAC;  Surgeon: Nadara Mustarduda, Marcus V, MD;  [facility];  Service: Orthopedics;;   APPLICATION OF WOUND VAC  07/30/2021   Procedure: APPLICATION OF WOUND VAC;  Surgeon: Nadara Mustarduda, Marcus V, MD;  Location: [facility] OR;  Service: Orthopedics;;   CARDIAC CATHETERIZATION     COLON SURGERY     CORONARY ARTERY BYPASS GRAFT N/A 05/22/2021   Procedure: CORONARY ARTERY BYPASS GRAFTING (CABG) X 3 USING LEFT INTERNAL MAMMARY ARTERY AND RIGHT ENDOSCOPIC GREATER SAPHENOUS VEIN CONDUITS;  Surgeon: Corliss SkainsLightfoot, Harrell O, MD;  Location: [facility] OR;  Service: Open Heart Surgery;  Laterality: N/A;   ENDOVEIN HARVEST OF GREATER SAPHENOUS VEIN Right 05/22/2021   Procedure: ENDOVEIN HARVEST OF GREATER SAPHENOUS VEIN;  Surgeon: Corliss SkainsLightfoot, Harrell O, MD;  Location: [facility];  Service: Open Heart Surgery;  Laterality: Right;   ESOPHAGOGASTRODUODENOSCOPY (EGD) WITH PROPOFOL N/A 03/12/2021   Procedure: ESOPHAGOGASTRODUODENOSCOPY (EGD) WITH PROPOFOL;  Surgeon: Jenel Lucksunningham, Scott E, MD;  [facility];  Service: Gastroenterology;  Laterality: N/A;   I & D EXTREMITY Left 02/24/2021   Procedure: IRRIGATION AND DEBRIDEMENT EXTREMITY;  Surgeon: Myrene GalasHandy, Cayleb, MD;  [facility];  Service: Orthopedics;  Laterality: Left;   I & D EXTREMITY Left 02/26/2021   Procedure: IRRIGATION AND DEBRIDEMENT OF LEG;  Surgeon: Nadara Mustarduda, Marcus V, MD;  [facility];  Service: Orthopedics;  Laterality: Left;   I & D EXTREMITY Left 03/03/2021   Procedure: LEFT LEG IRRIGATION AND DEBRIDEMENT;  Surgeon: Tarry KosXu, Naiping M, MD;  [facility]: [facility];  Service: Orthopedics;  Laterality: Left;   I & D EXTREMITY Left 03/07/2021   Procedure: REPEAT  DEBRIDEMENT LEFT LEG;  Surgeon: Nadara Mustarduda, Marcus V, MD;  [facility];  Service: Orthopedics;  Laterality: Left;   IR FLUORO GUIDE CV LINE RIGHT  03/10/2021   IR REMOVAL TUN CV CATH W/O FL  03/24/2021   IR US GUIDE VASC ACCESS RIGHT  03/10/2021   LEFT HEART CATH AND CORONARY ANGIOGRAPHY N/A 09/13/2020   Procedure: LEFT HEART CATH AND CORONARY ANGIOGRAPHY;  Surgeon: Lennette BihariKelly, Thomas A, MD;  [facility]: [facility] INVASIVE CV LAB;  Service: Cardiovascular;  Laterality: N/A;   LEFT HEART CATH AND CORONARY ANGIOGRAPHY N/A 03/28/2021   Procedure: LEFT HEART CATH AND CORONARY ANGIOGRAPHY;  Surgeon: Laurey MoraleMcLean, Dalton S, MD;  [facility];  Service: Cardiovascular;  Laterality: N/A;   RIGHT HEART CATH N/A 03/26/2021   Procedure: RIGHT HEART CATH;  Surgeon: Laurey MoraleMcLean, Dalton S, MD;  [facility];  Service:  Cardiovascular;  Laterality: N/A;   STUMP REVISION Left 05/06/2022   Procedure: REVISION LEFT BELOW KNEE AMPUTATION;  Surgeon: [name], MD;  [facility];  Service: Orthopedics;  Laterality: Left;   TEE WITHOUT CARDIOVERSION N/A 05/22/2021   Procedure: TRANSESOPHAGEAL ECHOCARDIOGRAM (TEE);  Surgeon: Corliss Skains, MD;  Location: [facility] OR;  Service: Open Heart Surgery;  Laterality: N/A;   TOE AMPUTATION  2020   Social History   Occupational History   Occupation: none    Comment: former "[employer]"  Tobacco Use   Smoking status: Former    Packs/day: 3.00    Years: 36.00    Total pack years: 108.00    Types: Cigarettes    Quit date: 08/27/2020    Years since quitting: 1.9   Smokeless tobacco: Never  Vaping Use   Vaping Use: Never used  Substance and Sexual Activity   Alcohol use: Not Currently    Comment: Very rare   Drug use: Not Currently    Types: Marijuana   Sexual activity: Not on file

## 2022-08-18 ENCOUNTER — Telehealth (HOSPITAL_COMMUNITY): Payer: Self-pay

## 2022-08-18 NOTE — Telephone Encounter (Signed)
Called to confirm/remind patient of their appointment at the [facility] on 08/19/22.   Left message to confirm appointment.

## 2022-08-19 ENCOUNTER — Telehealth: Payer: Self-pay | Admitting: Orthopedic Surgery

## 2022-08-19 ENCOUNTER — Ambulatory Visit (HOSPITAL_COMMUNITY)
Admission: RE | Admit: 2022-08-19 | Discharge: 2022-08-19 | Disposition: A | Discharge status: Home / Self Care | Payer: Medicaid Other | Source: Ambulatory Visit | Attending: Family Medicine | Admitting: Family Medicine

## 2022-08-19 ENCOUNTER — Encounter (HOSPITAL_COMMUNITY): Payer: Self-pay

## 2022-08-19 ENCOUNTER — Other Ambulatory Visit (HOSPITAL_COMMUNITY): Payer: Self-pay

## 2022-08-19 VITALS — BP 142/100 | HR 91 | Wt 249.4 lb

## 2022-08-19 DIAGNOSIS — N183 Chronic kidney disease, stage 3 unspecified: Secondary | ICD-10-CM | POA: Insufficient documentation

## 2022-08-19 DIAGNOSIS — I2510 Atherosclerotic heart disease of native coronary artery without angina pectoris: Secondary | ICD-10-CM | POA: Insufficient documentation

## 2022-08-19 DIAGNOSIS — Z7982 Long term (current) use of aspirin: Secondary | ICD-10-CM | POA: Diagnosis not present

## 2022-08-19 DIAGNOSIS — I272 Pulmonary hypertension, unspecified: Secondary | ICD-10-CM | POA: Diagnosis not present

## 2022-08-19 DIAGNOSIS — E11628 Type 2 diabetes mellitus with other skin complications: Secondary | ICD-10-CM | POA: Diagnosis not present

## 2022-08-19 DIAGNOSIS — I130 Hypertensive heart and chronic kidney disease with heart failure and stage 1 through stage 4 chronic kidney disease, or unspecified chronic kidney disease: Secondary | ICD-10-CM | POA: Insufficient documentation

## 2022-08-19 DIAGNOSIS — Z89619 Acquired absence of unspecified leg above knee: Secondary | ICD-10-CM

## 2022-08-19 DIAGNOSIS — I255 Ischemic cardiomyopathy: Secondary | ICD-10-CM | POA: Insufficient documentation

## 2022-08-19 DIAGNOSIS — Z7984 Long term (current) use of oral hypoglycemic drugs: Secondary | ICD-10-CM | POA: Diagnosis not present

## 2022-08-19 DIAGNOSIS — I25118 Atherosclerotic heart disease of native coronary artery with other forms of angina pectoris: Secondary | ICD-10-CM

## 2022-08-19 DIAGNOSIS — Z951 Presence of aortocoronary bypass graft: Secondary | ICD-10-CM | POA: Diagnosis not present

## 2022-08-19 DIAGNOSIS — Z8719 Personal history of other diseases of the digestive system: Secondary | ICD-10-CM | POA: Diagnosis not present

## 2022-08-19 DIAGNOSIS — E118 Type 2 diabetes mellitus with unspecified complications: Secondary | ICD-10-CM | POA: Diagnosis not present

## 2022-08-19 DIAGNOSIS — Z79899 Other long term (current) drug therapy: Secondary | ICD-10-CM | POA: Insufficient documentation

## 2022-08-19 DIAGNOSIS — I5022 Chronic systolic (congestive) heart failure: Secondary | ICD-10-CM | POA: Diagnosis not present

## 2022-08-19 DIAGNOSIS — E1122 Type 2 diabetes mellitus with diabetic chronic kidney disease: Secondary | ICD-10-CM | POA: Insufficient documentation

## 2022-08-19 DIAGNOSIS — Z89512 Acquired absence of left leg below knee: Secondary | ICD-10-CM | POA: Diagnosis not present

## 2022-08-19 DIAGNOSIS — I493 Ventricular premature depolarization: Secondary | ICD-10-CM | POA: Insufficient documentation

## 2022-08-19 DIAGNOSIS — Z87891 Personal history of nicotine dependence: Secondary | ICD-10-CM | POA: Diagnosis not present

## 2022-08-19 LAB — BASIC METABOLIC PANEL
Anion gap: 7 (ref 5–15)
BUN: 28 mg/dL — ABNORMAL HIGH (ref 6–20)
CO2: 25 mmol/L (ref 22–32)
Calcium: 9 mg/dL (ref 8.9–10.3)
Chloride: 106 mmol/L (ref 98–111)
Creatinine, Ser: 1.63 mg/dL — ABNORMAL HIGH (ref 0.61–1.24)
GFR, Estimated: 51 mL/min — ABNORMAL LOW (ref >60)
Glucose, Bld: 210 mg/dL — ABNORMAL HIGH (ref 70–99)
Potassium: 4.6 mmol/L (ref 3.5–5.1)
Sodium: 138 mmol/L (ref 135–145)

## 2022-08-19 LAB — BRAIN NATRIURETIC PEPTIDE: B Natriuretic Peptide: 102.2 pg/mL — ABNORMAL HIGH (ref 0.0–100.0)

## 2022-08-19 MED ORDER — DIGOXIN 125 MCG PO TABS: 0.1250 mg | ORAL_TABLET | Freq: Every day | ORAL | 3 refills | Status: DC

## 2022-08-19 MED ORDER — ASPIRIN 81 MG PO TBEC: 81.0000 mg | DELAYED_RELEASE_TABLET | Freq: Every day | ORAL | 3 refills | Status: DC

## 2022-08-19 MED ORDER — ATORVASTATIN CALCIUM 80 MG PO TABS: 80.0000 mg | ORAL_TABLET | Freq: Every day | ORAL | 3 refills | Status: DC

## 2022-08-19 MED ORDER — FUROSEMIDE 40 MG PO TABS: 20.0000 mg | ORAL_TABLET | Freq: Every day | ORAL | 3 refills | Status: DC

## 2022-08-19 MED ORDER — AMIODARONE HCL 200 MG PO TABS: 100.0000 mg | ORAL_TABLET | Freq: Every day | ORAL | 3 refills | Status: DC

## 2022-08-19 MED ORDER — CARVEDILOL 3.125 MG PO TABS: 3.1250 mg | ORAL_TABLET | Freq: Two times a day (BID) | ORAL | 3 refills | Status: DC

## 2022-08-19 NOTE — Telephone Encounter (Signed)
Pt's daughter [place] called requesting a script and office notes be sent to [facility] for left prostatic leg. Please call daughter about this matter at [phone].

## 2022-08-19 NOTE — Progress Notes (Signed)
Advanced Heart Failure Clinic Note    PCP: Charlott Rakes, MD Primary Cardiologist: Shirlee More, MD  HF Cardiologist: Dr. Aundra Dubin  HPI: [name] is a 52 y.o.with a history CAD, Had PCI 2007 in [place]. 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   Admitted to [facility] 2022 with lower extremity edema and foot ulcer. BNP and HS Trop elevated and felt to be non-ACS.  Cardiology consulted and he was placed on lasix.    He had follow up with Dr Bettina Gavia 09/09/20 and was set up for cath. Cath with multivessel CAD. Saw Dr Kipp Brood in the office with plans for CMRI. He was referred to SW due to financial concerns and no payor source. CMRI showed EF 40% and viable myocardium.     Saw Dr Kipp Brood in July 2022 and was set up for CABG in August. Pre CABG labs completed and showed WBC 25. He was advised to go to the ED.    Admitted 02/25/2021 with left foot infection complicated by osteomyelitis and necrotizing fasciitis. 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 Hospital course complicated by erosive esophagitis on EGD. Recommendations for protonix 40 mg bid + repeat EGD in 8 weeks. Received multiple transfusions. Discharged 03/15/21.    Admitted 03/23/21 with A/C CHF exacerbation. Started on IV lasix. Cardiology consulted. CO-OX 55%. Given 80 mg IV x1. V lasix increased to 100 mg tid and started on metolazone.  Repeat echo on 03/23/21 with LVEF of 25%, LV demonstrating global hypokinesis, and LV diastolic parameters consistent with grade II diastolic dysfunction. RHC performed 03/26/21 showed preserved cardiac output, mildly elevated PCWP, and mild pulmonary venous hypertension. LHC performed on 03/28/21 showed  minimal change from prior study in 2/22. Ost LAD to Mid LAD 90% stenosed, mid LAD to Dist LAD 90% stenosed, dist LAD lesion 100%  stenosed.prox Cx 70% stenosed, prox RCA to Mid RCA 100% stenosed, dist RCA 100% stenosed, ramus 70% stenosed, 1st Mrg 50% stenosed, mid LM to Dist LM 60% stenosed. Hospitalization c/b AKI on CKD 3.   After full treatment of foot infection, he was admitted in 10/22 for CABG with LIMA-LAD, SVG-OM1, SVG-OM3.  PDA too small to graft.   Underwent L BKA 1/23 due to osteomyelitis and abscess.  Admitted 4/23 with acute on chronic CHF. Diuresed with IV lasix. Echo in 4/23 showed EF 20-25%, RV moderately reduced, mild to moderate MR. GDMT restarted. Discharged home, weight 209 lbs. Refused paramedicine and PT/OT.  Echo 10/23 showed EF 30%, septal hypokinesis with septal-lateral dyssynchrony, mildly decreased RV systolic function, IVC normal.   Follow up 10/23, NYHA II and volume stable. Entresto started and lasix decreased to 20 mg daily. S/p revision of left below knee amputation.  Today he returns for HF follow up with his daughter. Overall feeling fine. He is not SOB with transfers out of his WC.  He uses a wheelchair generally but can maneuver a walker for short distances. Denies palpitations, abnormal bleeding, CP, dizziness, edema, or PND/Orthopnea. Appetite ok. No fever or chills. He does not weigh at home. Left stump wound is now healed.  Out of meds x 1 week. Awaiting prosthesis. Quit smoking > 12 months ago, appetite has improved. Drinking >2L/day.  ECG (personally reviewed): none ordered today.  Labs (11/22): K 4.2, creatinine 1.38 Labs (12/22): K 4.4, creatinine 1.94 Labs (4/23): K 4.2, creatinine 2.1, LDL 112 Labs (5/23): K 4.7 => 5.3, creatinine 1.6 => 1.34 Labs (9/23): TSH nl, K 5.3, creatinine 1.96, LFTs normal Labs (10/23): K 4.4, creatinine 1.97, LDL 99, HDL 36  PMH: 1. GI bleeding: Erosive esophagitis 8/22.  2. CKD stage 3 3. Type 2 diabetes 4. Hyperlipidemia 5. Left foot/leg diabetic infection with necrotizing fasciitis: 8/22,  debridements were done and the left 5th metatarsal was amputated.  1/23 had left BKA.  6. Prior smoker 7. H/o PVC, VT: On amiodarone.  8. CAD: LHC in 9/22 with severe 3 vessel disease.  - CABG (10/22) with LIMA-LAD, SVG-OM2, SVG-OM3.  9. Chronic systolic CHF: Ischemic cardiomyopathy.  - Echo (8/22): LVEF of 25%, LV demonstrating global hypokinesis, and LV diastolic parameters consistent with grade II diastolic dysfunction.  - Echo (4/23): EF 20-25%, severe LV dysfunction, RV moderately reduced, RVSP 46.8 mmHg, mild to moderate MR - Echo (10/23): EF 30%, septal hypokinesis with septal-lateral dyssynchrony, mildly decreased RV systolic function, IVC normal.   Review of Systems: All systems reviewed and negative except as per HPI.   Current Outpatient Medications  Medication Sig Dispense Refill   albuterol (VENTOLIN HFA) 108 (90 Base) MCG/ACT inhaler Inhale 2 puffs into the lungs every 4 (four) hours as needed for shortness of breath or wheezing.     amiodarone (PACERONE) 200 MG tablet Take 0.5 tablets (100 mg total) by mouth daily. 30 tablet 3   aspirin EC 81 MG tablet Take 1 tablet (81 mg total) by mouth daily. 30 tablet 11   atorvastatin (LIPITOR) 80 MG tablet Take 1 tablet (80 mg total) by mouth daily. 30 tablet 3   carvedilol (COREG) 3.125 MG tablet Take 1 tablet (3.125 mg total) by mouth 2 (two) times daily. 180 tablet 3   digoxin (LANOXIN) 0.125 MG tablet Take 1 tablet (0.125 mg total) by mouth daily. 30 tablet 3   empagliflozin (JARDIANCE) 10 MG TABS tablet Take 1 tablet (10 mg total) by mouth daily. 30 tablet 3   furosemide (LASIX) 40 MG tablet Take 0.5 tablets (20 mg total) by mouth daily. 30 tablet 3   gabapentin (NEURONTIN) 300 MG capsule Take 1 capsule (300 mg total) by mouth at bedtime. 90 capsule 1   glipiZIDE (GLUCOTROL) 5 MG tablet Take 1 tablet (5 mg total) by mouth daily before breakfast. 30 tablet 2   glucose blood (TRUE METRIX BLOOD GLUCOSE TEST) test strip Use as instructed  twice daily 100 each 12   sacubitril-valsartan (ENTRESTO) 24-26 MG Take 1 tablet by mouth 2 (two) times daily. 180 tablet 3   Semaglutide,0.25 or 0.5MG /DOS, (OZEMPIC, 0.25 OR 0.5 MG/DOSE,) 2 MG/3ML SOPN Inject 0.25 mg into the skin once a week. (Patient taking differently: Inject 0.25 mg into the skin once a week. Takes on Wednesday) 3 mL 6   TRUEplus Lancets 28G MISC Use as directed twice daily at 8 am and 10 pm. 100 each 11   venlafaxine XR (EFFEXOR-XR) 75 MG 24 hr capsule Take 1 capsule (75 mg total) by mouth daily with breakfast. 30 capsule 2   No current facility-administered medications for this encounter.   Allergies  Allergen Reactions   Morphine Itching   Social History   Socioeconomic History   Marital status: Single    Spouse name: Not on file   Number of children: 1   Years of education: Not  on file   Highest education level: 8th grade  Occupational History   Occupation: none    Comment: former "[employer]"  Tobacco Use   Smoking status: Former    Packs/day: 3.00    Years: 36.00    Total pack years: 108.00    Types: Cigarettes    Quit date: 08/27/2020    Years since quitting: 1.9   Smokeless tobacco: Never  Vaping Use   Vaping Use: Never used  Substance and Sexual Activity   Alcohol use: Not Currently    Comment: Very rare   Drug use: Not Currently    Types: Marijuana   Sexual activity: Not on file  Other Topics Concern   Not on file  Social History Narrative   Not on file   Social Determinants of Health   Financial Resource Strain: High Risk (02/20/2022)   Overall Financial Resource Strain (CARDIA)    Difficulty of Paying Living Expenses: Hard  Food Insecurity: Food Insecurity Present (02/04/2022)   Hunger Vital Sign    Worried About Running Out of Food in the Last Year: Sometimes true    Ran Out of Food in the Last Year: Sometimes true  Transportation Needs: No Transportation Needs (11/06/2021)   PRAPARE - Hydrologist  (Medical): No    Lack of Transportation (Non-Medical): No  Physical Activity: Not on file  Stress: Not on file  Social Connections: Not on file  Intimate Partner Violence: Not on file   Family History  Problem Relation Age of Onset   Hypertension Mother    Heart failure Father    Hypertension Brother    Congestive Heart Failure Brother    BP (!) 142/100   Pulse 91   Wt 113.1 kg (249 lb 6.4 oz)   SpO2 97%   BMI 31.17 kg/m   Wt Readings from Last 3 Encounters:  08/19/22 113.1 kg (249 lb 6.4 oz)  05/06/22 106.6 kg (235 lb)  12/24/21 97.7 kg (215 lb 6.4 oz)   PHYSICAL EXAM: General:  NAD. No resp difficulty, arrived in [facility] HEENT: Normal Neck: Supple. No JVD. Carotids 2+ bilat; no bruits. No lymphadenopathy or thryomegaly appreciated. Cor: PMI nondisplaced. Regular rate & rhythm. No rubs, gallops or murmurs. Lungs: Clear Abdomen: Soft, nontender, nondistended. No hepatosplenomegaly. No bruits or masses. Good bowel sounds. Extremities: No cyanosis, clubbing, rash, edema; L BKA Neuro: Alert & oriented x 3, cranial nerves grossly intact. Moves all 4 extremities w/o difficulty. Affect pleasant.  ASSESSMENT & PLAN: 1. Chronic systolic CHF: Ischemic cardiomyopathy.  Initial echo in 2/22 with EF 40-45%.  Echo in 7/22 with EF 30-35%.  Echo 8/22 EF down to 25% with mild RV dysfunction and dilated IVC.  [place] 8/22 with preserved CO, mildly elevated PCWP, mild pulmonary hypertension. S/p CABG 10/22. Readmitted 4/23 with CHF, had been off diuretics and missed follow up. Echo in 4/23 with EF 20-25%, RV moderately reduced, mild to moderate MR. Echo 10/23 EF 30%, septal hypokinesis with septal-lateral dyssynchrony, mildly decreased RV systolic function, IVC normal.  NYHA II though functional class difficult due to L BKA.  He is not volume overloaded on exam although weight is up, though I think this is in part caloric as he has stopped smoking.  - Restart Lasix 20 mg daily.  - Restart Entresto 24/26  bid, BMET/BNP today. If K is high, would add Lokelma to bind.  - Restart Jardiance 10 mg daily. - He is off eplerenone, will not start until I  see that K is controlled (mildly high on 9/23 BMET).  - Restart digoxin 0.125 mg daily.  - Restart Coreg 3.125 mg bid.  - He qualifies for an ICD but not CRT with narrow QRS.  He agreeable to ICD, and his stump wound is now healed. Refer to EP to discuss ICD. 2. CAD: Hx PCI 2007. S/p CABG x 3 with LIMA-LAD, SVG-OM2, and SVG-OM3 10/22. No chest pain.  - Restart  ASA 81 daily.  - Restart atorvastatin 80 mg daily. LDL 99 (10/23). 3. PVCs/VT: Noted 10/22 post CABG.  - Continue amiodarone 100 mg daily.  Recent TSH and LFTs ok, he will need regular eye exam.  4. CKD stage 3: BMET today.  5. GI bleeding: History of erosive esophagitis earlier in 8/22.  He is now on Protonix.   6. Left leg diabetic foot infection: Underwent left BKA 1/23. And revision of L BKA 10/23. Left stump now healed.  - Followup with with Dr. Lajoyce Corners.  - Ortho helping with prosthesis.  7. DM2:  PCP managing.  Follow up in 2 weeks with APP for med check (LFTS, dig, TSH and EKG) and 3 months with Dr. Shirlee Latch. Refer to EP for ICD.  [facility] FNP-BC 08/19/22

## 2022-08-19 NOTE — Patient Instructions (Addendum)
Thank you for coming in today  Labs were done today, if any labs are abnormal the clinic will call you No news is good news  RESTART ALL Heart Failure medications   Your physician recommends that you schedule a follow-up appointment in:  2-3 weeks in clinic   You have been referred to EP clinic for ICD placement their clinic will contact you for further appointment details   Your physician recommends that you schedule a follow-up appointment in:  3 months with Dr. Kendall Flack will receive a reminder letter in the mail a few months in advance. If you don't receive a letter, please call our office to schedule the follow-up appointment.     Do the following things EVERYDAY: Weigh yourself in the morning before breakfast. Write it down and keep it in a log. Take your medicines as prescribed Eat low salt foods--Limit salt (sodium) to 2000 mg per day.  Stay as active as you can everyday Limit all fluids for the day to less than 2 liters  At the [facility], you and your health needs are our priority. As part of our continuing mission to provide you with exceptional heart care, we have created designated Provider Care Teams. These Care Teams include your primary Cardiologist (physician) and Advanced Practice Providers (APPs- Physician Assistants and Nurse Practitioners) who all work together to provide you with the care you need, when you need it.   You may see any of the following providers on your designated Care Team at your next follow up: Dr Glori Bickers Dr Loralie Champagne Dr. Roxana Hires, NP Lyda Jester, [place] [facility] [place], [place] Forestine Na, NP Audry Riles, PharmD   Please be sure to bring in all your medications bottles to every appointment.   If you have any questions or concerns before your next appointment please send [place] a message through [place] or call our office at [phone].    TO LEAVE A MESSAGE FOR THE NURSE  SELECT OPTION 2, PLEASE LEAVE A MESSAGE INCLUDING: YOUR NAME DATE OF BIRTH CALL BACK NUMBER REASON FOR CALL**this is important as we prioritize the call backs  YOU WILL RECEIVE A CALL BACK THE SAME DAY AS LONG AS YOU CALL BEFORE 4:00 PM

## 2022-08-19 NOTE — Progress Notes (Signed)
H&V Care Navigation CSW Progress Note  Clinical Social Worker informed pt copays are $113 at pharmacy and they can't afford- pt has medicaid so this should not be correct.  CSW called pharmacy and confirmed that is what pt will owe but this is because they do not have his Medicaid information.  CSW worked with patient advocate to get all his necessary medicaid numbers and provided to pharmacy- now they state copays are $24 but they report they will waive copays for those who can't afford them as long as they ask when they pick up the meds.  Pt informed of above.  Patient is participating in a Managed Medicaid Plan:  Yes  [place]: Food Insecurity Present (02/04/2022)  Housing: Low Risk  (11/06/2021)  Transportation Needs: No Transportation Needs (11/06/2021)  Alcohol Screen: Low Risk  (03/26/2021)  Depression (PHQ2-9): High Risk (03/31/2022)  Financial Resource Strain: High Risk (02/20/2022)  Tobacco Use: Medium Risk (08/12/2022)   [name], LCSW Clinical Social Worker Advanced Heart Failure Clinic Desk#: [phone] Cell#: [phone]

## 2022-08-19 NOTE — Telephone Encounter (Signed)
Pt s/p revision left BKA 05/06/2022 was just in the office last week. Can you write rx for prosthetic and supplies?

## 2022-08-20 ENCOUNTER — Other Ambulatory Visit (HOSPITAL_COMMUNITY): Payer: Self-pay

## 2022-08-21 ENCOUNTER — Other Ambulatory Visit: Payer: Self-pay

## 2022-08-21 NOTE — Telephone Encounter (Signed)
sent

## 2022-08-27 DIAGNOSIS — Z419 Encounter for procedure for purposes other than remedying health state, unspecified: Secondary | ICD-10-CM | POA: Diagnosis not present

## 2022-08-31 ENCOUNTER — Other Ambulatory Visit: Payer: Self-pay

## 2022-08-31 ENCOUNTER — Ambulatory Visit: Payer: Medicaid Other | Attending: Physician Assistant | Admitting: Physician Assistant

## 2022-08-31 ENCOUNTER — Encounter: Payer: Self-pay | Admitting: Physician Assistant

## 2022-08-31 VITALS — BP 100/80 | HR 86 | Ht 72.0 in | Wt 248.0 lb

## 2022-08-31 DIAGNOSIS — Z951 Presence of aortocoronary bypass graft: Secondary | ICD-10-CM | POA: Diagnosis not present

## 2022-08-31 DIAGNOSIS — E1142 Type 2 diabetes mellitus with diabetic polyneuropathy: Secondary | ICD-10-CM

## 2022-08-31 DIAGNOSIS — S88112A Complete traumatic amputation at level between knee and ankle, left lower leg, initial encounter: Secondary | ICD-10-CM

## 2022-08-31 DIAGNOSIS — G546 Phantom limb syndrome with pain: Secondary | ICD-10-CM | POA: Diagnosis not present

## 2022-08-31 DIAGNOSIS — F3289 Other specified depressive episodes: Secondary | ICD-10-CM | POA: Diagnosis not present

## 2022-08-31 DIAGNOSIS — E1159 Type 2 diabetes mellitus with other circulatory complications: Secondary | ICD-10-CM

## 2022-08-31 DIAGNOSIS — I5043 Acute on chronic combined systolic (congestive) and diastolic (congestive) heart failure: Secondary | ICD-10-CM

## 2022-08-31 LAB — POCT GLYCOSYLATED HEMOGLOBIN (HGB A1C): Hemoglobin A1C: 7.5 % — AB (ref 4.0–5.6)

## 2022-08-31 MED ORDER — GABAPENTIN 300 MG PO CAPS: 300.0000 mg | ORAL_CAPSULE | Freq: Every day | ORAL | 1 refills | Status: DC

## 2022-08-31 MED ORDER — VENLAFAXINE HCL ER 75 MG PO CP24: 75.0000 mg | ORAL_CAPSULE | Freq: Every day | ORAL | 1 refills | Status: DC

## 2022-08-31 MED ORDER — GLIPIZIDE 5 MG PO TABS: 5.0000 mg | ORAL_TABLET | Freq: Every day | ORAL | 2 refills | Status: DC

## 2022-08-31 MED ORDER — EMPAGLIFLOZIN 10 MG PO TABS: 10.0000 mg | ORAL_TABLET | Freq: Every day | ORAL | 3 refills | Status: DC

## 2022-08-31 MED ORDER — BLOOD PRESSURE CUFF MISC: 1.0000 | 0 refills | Status: DC

## 2022-08-31 NOTE — Patient Instructions (Signed)
Drink adequate water.  Check blood pressure daily.    Report low blood pressures to your cardiologist, especially if you have dizziness or weakness

## 2022-08-31 NOTE — Progress Notes (Signed)
Patient ID: Adam James, male   DOB: 1970-11-17, 52 y.o.   MRN: [id number]   Adam James, is a 52 y.o. male  [id number]  [id number]  DOB - 1971/07/22  Chief Complaint  Patient presents with   Medication Refill   Follow-up       Subjective:   Adam James is a 52 y.o. male here today for med RF.  Just saw cardiology 08/19/2022 and his medications were resumed.  He has been taking ozempic but was not taking jardiance or glipizide or carvedilol/entresto,lasix.  Labs done by cardiology  He saw his eye doctor last week and was told he has B cataracts.  He does not check blood pressures at home.  Despite BP being on the low side today, he says he feels fine.  No weakness/fatigue/SOB.  His daighter is here with him.  Still has phantom pain from LBKA.     No problems updated.  ALLERGIES: Allergies  Allergen Reactions   Morphine Itching    PAST MEDICAL HISTORY: Past Medical History:  Diagnosis Date   Anemia    GI bleed; 03/13/11 EGD: relux esophagitis with ulceration/clot   CAD (coronary artery disease)    Cardiomyopathy (HCC)    CHF (congestive heart failure) (HCC)    CKD (chronic kidney disease)    Diabetic foot ulcer ([place])    Dyslipidemia    Dyspnea    08/05/21- not recently.   Dysrhythmia    PVCs   Heart failure (HCC)    Hypertension    Myocardial infarction ([place])    Type 2 diabetes mellitus ([place])     MEDICATIONS AT HOME: Prior to Admission medications   Medication Sig Start Date End Date Taking? Authorizing Provider  albuterol (VENTOLIN HFA) 108 (90 Base) MCG/ACT inhaler Inhale 2 puffs into the lungs every 4 (four) hours as needed for shortness of breath or wheezing. 10/09/21  Yes [provider]  amiodarone (PACERONE) 200 MG tablet Take 0.5 tablets (100 mg total) by mouth daily. 08/19/22  Yes Milford, Maricela Bo, FNP  aspirin EC 81 MG tablet Take 1 tablet (81 mg total) by mouth daily. 08/19/22  Yes Milford, Maricela Bo, FNP  atorvastatin  (LIPITOR) 80 MG tablet Take 1 tablet (80 mg total) by mouth daily. 08/19/22  Yes Milford, Maricela Bo, FNP  Blood Pressure Monitoring (BLOOD PRESSURE CUFF) MISC 1 each by Does not apply route as directed. 08/31/22  Yes Freeman Caldron M, PA-C  carvedilol (COREG) 3.125 MG tablet Take 1 tablet (3.125 mg total) by mouth 2 (two) times daily. 08/19/22  Yes Milford, Maricela Bo, FNP  digoxin (LANOXIN) 0.125 MG tablet Take 1 tablet (0.125 mg total) by mouth daily. 08/19/22  Yes Milford, Maricela Bo, FNP  furosemide (LASIX) 40 MG tablet Take 0.5 tablets (20 mg total) by mouth daily. 08/19/22  Yes Milford, Jessica M, FNP  glucose blood (TRUE METRIX BLOOD GLUCOSE TEST) test strip Use as instructed twice daily 05/12/21  Yes Mayers, Cari S, PA-C  sacubitril-valsartan (ENTRESTO) 24-26 MG Take 1 tablet by mouth 2 (two) times daily. 04/28/22  Yes Larey Dresser, MD  Semaglutide,0.25 or 0.5MG /DOS, (OZEMPIC, 0.25 OR 0.5 MG/DOSE,) 2 MG/3ML SOPN Inject 0.25 mg into the skin once a week. 03/31/22  Yes Charlott Rakes, MD  TRUEplus Lancets 28G MISC Use as directed twice daily at 8 am and 10 pm. 05/12/21  Yes Mayers, Cari S, PA-C  empagliflozin (JARDIANCE) 10 MG TABS tablet Take 1 tablet (10 mg total) by mouth daily. 08/31/22   Rockelle Heuerman,  Dionne Bucy, PA-C  gabapentin (NEURONTIN) 300 MG capsule Take 1 capsule (300 mg total) by mouth at bedtime. 08/31/22   Argentina Donovan, PA-C  glipiZIDE (GLUCOTROL) 5 MG tablet Take 1 tablet (5 mg total) by mouth daily before breakfast. 08/31/22   Argentina Donovan, PA-C  venlafaxine XR (EFFEXOR-XR) 75 MG 24 hr capsule Take 1 capsule (75 mg total) by mouth daily with breakfast. 08/31/22   Hailei Besser, Dionne Bucy, PA-C    ROS: Neg HEENT Neg resp Neg cardiac Neg GI Neg GU Neg MS Neg psych Neg neuro  Objective:   Vitals:   08/31/22 0926 08/31/22 0957  BP: 95/68 100/80  Pulse: 86   SpO2: 96%   Weight: 248 lb (112.5 kg)   Height: 6' (1.829 m)    Exam General appearance : Awake, alert, not in any  distress. Speech Clear. Not toxic looking.  In wheelchair HEENT: Atraumatic and Normocephalic Neck: Supple, no JVD. No cervical lymphadenopathy.  Chest: Good air entry bilaterally, CTAB.  No rales/rhonchi/wheezing CVS: S1 S2 regular, no murmurs.  Neurology: Awake alert, and oriented X 3, CN II-XII intact, Non focal Skin: No Rash  Data Review Lab Results  Component Value Date   HGBA1C 7.5 (A) 08/31/2022   HGBA1C 8.2 (A) 03/31/2022   HGBA1C 10.1 (H) 11/05/2021    Assessment & Plan   1. Type 2 diabetes mellitus with other circulatory complication, without long-term current use of insulin (HCC) Improved but was out of jardiance and glipizide.  Continue ozempic - HgB A1c - empagliflozin (JARDIANCE) 10 MG TABS tablet; Take 1 tablet (10 mg total) by mouth daily.  Dispense: 30 tablet; Refill: 3 - glipiZIDE (GLUCOTROL) 5 MG tablet; Take 1 tablet (5 mg total) by mouth daily before breakfast.  Dispense: 90 tablet; Refill: 2  2. Diabetic polyneuropathy associated with type 2 diabetes mellitus (HCC) - gabapentin (NEURONTIN) 300 MG capsule; Take 1 capsule (300 mg total) by mouth at bedtime.  Dispense: 90 capsule; Refill: 1 - empagliflozin (JARDIANCE) 10 MG TABS tablet; Take 1 tablet (10 mg total) by mouth daily.  Dispense: 30 tablet; Refill: 3  3. Phantom pain (HCC) - gabapentin (NEURONTIN) 300 MG capsule; Take 1 capsule (300 mg total) by mouth at bedtime.  Dispense: 90 capsule; Refill: 1  4. Below-knee amputation of left lower extremity (HCC) stable  5. Other depression Stable.  Denies SI/HI - venlafaxine XR (EFFEXOR-XR) 75 MG 24 hr capsule; Take 1 capsule (75 mg total) by mouth daily with breakfast.  Dispense: 90 capsule; Refill: 1  6. Acute on chronic combined systolic and diastolic CHF (congestive heart failure) (HCC) - Blood Pressure Monitoring (BLOOD PRESSURE CUFF) MISC; 1 each by Does not apply route as directed.  Dispense: 1 each; Refill: 0  7. S/P CABG x 3 - Blood Pressure  Monitoring (BLOOD PRESSURE CUFF) MISC; 1 each by Does not apply route as directed.  Dispense: 1 each; Refill: 0 Drink adequate water. Check blood pressure daily.   Report low blood pressures to your cardiologist, especially if you have dizziness or weakness   Return in about 3 months (around 11/29/2022) for PCP for chronic conditions.  The patient was given clear instructions to go to ER or return to medical center if symptoms don't improve, worsen or new problems develop. The patient verbalized understanding. The patient was told to call to get lab results if they haven't heard anything in the next week.      Freeman Caldron, PA-C [place] [facility]  [place], [place]   08/31/2022, 10:03 AM

## 2022-09-01 ENCOUNTER — Other Ambulatory Visit: Payer: Self-pay

## 2022-09-01 NOTE — Progress Notes (Signed)
Advanced Heart Failure Clinic Note    PCP: Charlott Rakes, MD Primary Cardiologist: Shirlee More, MD  HF Cardiologist: Dr. Aundra Dubin  HPI: [name] is a 52 y.o.with a history CAD, Had PCI 2007 in [place]. 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   Admitted to [facility] in February 2022 with lower extremity edema and foot ulcer. BNP and HS Trop elevated and felt to be non-ACS.  Cardiology consulted and he was placed on lasix.    He had follow up with Dr Bettina Gavia 09/09/20 and was set up for cath. Cath with multivessel CAD. Saw Dr Kipp Brood in the office with plans for CMRI. He was referred to SW due to financial concerns and no payor source. CMRI showed EF 40% and viable myocardium.     Saw Dr Kipp Brood in July 2022 and was set up for CABG in August. Pre CABG labs completed and showed WBC 25. He was advised to go to the ED.    Admitted 02/25/2021 with left foot infection complicated by osteomyelitis and necrotizing fasciitis. 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 Hospital course complicated by erosive esophagitis on EGD. Recommendations for protonix 40 mg bid + repeat EGD in 8 weeks. Received multiple transfusions. Discharged 03/15/21.    Admitted 03/23/21 with A/C CHF exacerbation. Started on IV lasix. Cardiology consulted. CO-OX 55%. Given 80 mg IV x1. V lasix increased to 100 mg tid and started on metolazone.  Repeat echo on 03/23/21 with LVEF of 25%, LV demonstrating global hypokinesis, and LV diastolic parameters consistent with grade II diastolic dysfunction. RHC performed 03/26/21 showed preserved cardiac output, mildly elevated PCWP, and mild pulmonary venous hypertension. LHC performed on 03/28/21 showed  minimal change from prior study in 2/22. Ost LAD to Mid LAD 90% stenosed, mid LAD to Dist LAD 90% stenosed, dist LAD lesion 100%  stenosed.prox Cx 70% stenosed, prox RCA to Mid RCA 100% stenosed, dist RCA 100% stenosed, ramus 70% stenosed, 1st Mrg 50% stenosed, mid LM to Dist LM 60% stenosed. Hospitalization c/b AKI on CKD 3.   After full treatment of foot infection, he was admitted in 10/22 for CABG with LIMA-LAD, SVG-OM1, SVG-OM3.  PDA too small to graft.   Underwent L BKA 1/23 due to osteomyelitis and abscess.  Admitted 4/23 with acute on chronic CHF. Diuresed with IV lasix. Echo in 4/23 showed EF 20-25%, RV moderately reduced, mild to moderate MR. GDMT restarted. Discharged home, weight 209 lbs. Refused paramedicine and PT/OT.  Echo 10/23 showed EF 30%, septal hypokinesis with septal-lateral dyssynchrony, mildly decreased RV systolic function, IVC normal.   Follow up 10/23, NYHA II and volume stable. Entresto started and lasix decreased to 20 mg daily. S/p revision of left below knee amputation.  Today he returns for HF follow up with his daughter. Overall feeling fine. He is not short of breath with transfers out of his wheelchair. Denies palpitations, abnormal bleeding, CP, dizziness, edema, or PND/Orthopnea. Appetite ok. No fever or chills. Weight is down 5 lbs, blood sugar better controlled. Taking all medications. Awaiting prosthesis. Quit smoking > 12 months ago, no ETOH/drug use.                                       ECG (personally reviewed): NSR 99 bpm  Labs (11/22): K 4.2, creatinine 1.38 Labs (12/22): K 4.4,  creatinine 1.94 Labs (4/23): K 4.2, creatinine 2.1, LDL 112 Labs (5/23): K 4.7 => 5.3, creatinine 1.6 => 1.34 Labs (9/23): TSH nl, K 5.3, creatinine 1.96, LFTs normal Labs (10/23): K 4.4, creatinine 1.97, LDL 99, HDL 36 Labs (1/24): K 4.6, creatinine 1.63  PMH: 1. GI bleeding: Erosive esophagitis 8/22.  2. CKD stage 3 3. Type 2 diabetes 4. Hyperlipidemia 5. Left foot/leg diabetic infection with necrotizing fasciitis: 8/22, debridements were done and the left 5th metatarsal was amputated.  1/23  had left BKA.  6. Prior smoker 7. H/o PVC, VT: On amiodarone.  8. CAD: LHC in 9/22 with severe 3 vessel disease.  - CABG (10/22) with LIMA-LAD, SVG-OM2, SVG-OM3.  9. Chronic systolic CHF: Ischemic cardiomyopathy.  - Echo (8/22): LVEF of 25%, LV demonstrating global hypokinesis, and LV diastolic parameters consistent with grade II diastolic dysfunction.  - Echo (4/23): EF 20-25%, severe LV dysfunction, RV moderately reduced, RVSP 46.8 mmHg, mild to moderate MR - Echo (10/23): EF 30%, septal hypokinesis with septal-lateral dyssynchrony, mildly decreased RV systolic function, IVC normal.   Review of Systems: All systems reviewed and negative except as per HPI.   Current Outpatient Medications  Medication Sig Dispense Refill   albuterol (VENTOLIN HFA) 108 (90 Base) MCG/ACT inhaler Inhale 2 puffs into the lungs every 4 (four) hours as needed for shortness of breath or wheezing.     amiodarone (PACERONE) 200 MG tablet Take 0.5 tablets (100 mg total) by mouth daily. 30 tablet 3   aspirin EC 81 MG tablet Take 1 tablet (81 mg total) by mouth daily. 30 tablet 3   atorvastatin (LIPITOR) 80 MG tablet Take 1 tablet (80 mg total) by mouth daily. 30 tablet 3   Blood Pressure Monitoring (BLOOD PRESSURE CUFF) MISC 1 each by Does not apply route as directed. 1 each 0   carvedilol (COREG) 3.125 MG tablet Take 1 tablet (3.125 mg total) by mouth 2 (two) times daily. 60 tablet 3   digoxin (LANOXIN) 0.125 MG tablet Take 1 tablet (0.125 mg total) by mouth daily. 30 tablet 3   empagliflozin (JARDIANCE) 10 MG TABS tablet Take 1 tablet (10 mg total) by mouth daily. 30 tablet 3   furosemide (LASIX) 40 MG tablet Take 0.5 tablets (20 mg total) by mouth daily. 30 tablet 3   gabapentin (NEURONTIN) 300 MG capsule Take 1 capsule (300 mg total) by mouth at bedtime. 90 capsule 1   glipiZIDE (GLUCOTROL) 5 MG tablet Take 1 tablet (5 mg total) by mouth daily before breakfast. 90 tablet 2   glucose blood (TRUE METRIX BLOOD GLUCOSE  TEST) test strip Use as instructed twice daily 100 each 12   sacubitril-valsartan (ENTRESTO) 24-26 MG Take 1 tablet by mouth 2 (two) times daily. 180 tablet 3   Semaglutide,0.25 or 0.5MG /DOS, (OZEMPIC, 0.25 OR 0.5 MG/DOSE,) 2 MG/3ML SOPN Inject 0.25 mg into the skin once a week. 3 mL 6   TRUEplus Lancets 28G MISC Use as directed twice daily at 8 am and 10 pm. 100 each 11   venlafaxine XR (EFFEXOR-XR) 75 MG 24 hr capsule Take 1 capsule (75 mg total) by mouth daily with breakfast. 90 capsule 1   No current facility-administered medications for this encounter.   Allergies  Allergen Reactions   Morphine Itching   Social History   Socioeconomic History   Marital status: Single    Spouse name: Not on file   Number of children: 1   Years of education: Not on file   Highest  education level: 8th grade  Occupational History   Occupation: none    Comment: former "[employer]"  Tobacco Use   Smoking status: Former    Packs/day: 3.00    Years: 36.00    Total pack years: 108.00    Types: Cigarettes    Quit date: 08/27/2020    Years since quitting: 2.0   Smokeless tobacco: Never  Vaping Use   Vaping Use: Never used  Substance and Sexual Activity   Alcohol use: Not Currently    Comment: Very rare   Drug use: Not Currently    Types: Marijuana   Sexual activity: Not on file  Other Topics Concern   Not on file  Social History Narrative   Not on file   Social Determinants of Health   Financial Resource Strain: High Risk (02/20/2022)   Overall Financial Resource Strain (CARDIA)    Difficulty of Paying Living Expenses: Hard  Food Insecurity: Food Insecurity Present (02/04/2022)   Hunger Vital Sign    Worried About Running Out of Food in the Last Year: Sometimes true    Ran Out of Food in the Last Year: Sometimes true  Transportation Needs: No Transportation Needs (11/06/2021)   PRAPARE - Administrator, Civil Service (Medical): No    Lack of Transportation (Non-Medical): No   Physical Activity: Not on file  Stress: Not on file  Social Connections: Not on file  Intimate Partner Violence: Not on file   Family History  Problem Relation Age of Onset   Hypertension Mother    Heart failure Father    Hypertension Brother    Congestive Heart Failure Brother    BP 132/80   Pulse 95   Wt 110.7 kg (244 lb)   SpO2 95%   BMI 33.09 kg/m   Wt Readings from Last 3 Encounters:  09/02/22 110.7 kg (244 lb)  08/31/22 112.5 kg (248 lb)  08/19/22 113.1 kg (249 lb 6.4 oz)   PHYSICAL EXAM: General:  NAD. No resp difficulty, arrived in [facility] HEENT: Normal Neck: Supple. No JVD. Carotids 2+ bilat; no bruits. No lymphadenopathy or thryomegaly appreciated. Cor: PMI nondisplaced. Regular rate & rhythm. No rubs, gallops or murmurs. Lungs: Clear Abdomen: Soft, nontender, nondistended. No hepatosplenomegaly. No bruits or masses. Good bowel sounds. Extremities: No cyanosis, clubbing, rash, edema; s/p L BKA Neuro: Alert & oriented x 3, cranial nerves grossly intact. Moves all 4 extremities w/o difficulty. Affect pleasant.  ASSESSMENT & PLAN: 1. Chronic systolic CHF: Ischemic cardiomyopathy.  Initial echo in 2/22 with EF 40-45%.  Echo in 7/22 with EF 30-35%.  Echo 8/22 EF down to 25% with mild RV dysfunction and dilated IVC.  RHC 8/22 with preserved CO, mildly elevated PCWP, mild pulmonary hypertension. S/p CABG 10/22. Readmitted 4/23 with CHF, had been off diuretics and missed follow up. Echo in 4/23 with EF 20-25%, RV moderately reduced, mild to moderate MR. Echo 10/23 EF 30%, septal hypokinesis with septal-lateral dyssynchrony, mildly decreased RV systolic function, IVC normal.  NYHA II, though functional class difficult due to L BKA.  He is not volume overloaded on exam  - Restart eplerenone 25 mg daily and change lasix to 20 mg PRN. BMET/BNP today, repeat BMET in 1 week. - Continue Entresto 24/26 bid. If K is high on labs today, would add Lokelma to bind.  - Continue Jardiance 10  mg daily. No GU symptoms. - Continue digoxin 0.125 mg daily. Check dig level today. - Continue Coreg 3.125 mg bid.  - He  qualifies for an ICD but not CRT with narrow QRS.  He agreeable to ICD, and his stump wound is now healed. He has been referred to EP to discuss ICD. 2. CAD: Hx PCI 2007. S/p CABG x 3 with LIMA-LAD, SVG-OM2, and SVG-OM3 10/22. No chest pain.  - Continue  ASA 81 daily.  - Continue atorvastatin 80 mg daily. LDL 99 (10/23). 3. PVCs/VT: Noted 10/22 post CABG. No PVCs on ECG today. - Continue amiodarone 100 mg daily.  Check TSH and LFTs, he will need regular eye exam.  4. CKD stage 3: BMET today.  5. GI bleeding: History of erosive esophagitis earlier in 8/22.  He is now on Protonix.   6. Left leg diabetic foot infection: Underwent left BKA 1/23. And revision of L BKA 10/23. Left stump now healed.  - Followup with with Dr. Sharol Given.  - Ortho helping with prosthesis.  7. DM2:  hgb A1C 7.5 (2/24). PCP managing.  Follow up in 3 months with Dr. Aundra Dubin.  Maricela Bo [facility] FNP-BC 09/02/22

## 2022-09-02 ENCOUNTER — Encounter (HOSPITAL_COMMUNITY): Payer: Self-pay

## 2022-09-02 ENCOUNTER — Ambulatory Visit (HOSPITAL_COMMUNITY)
Admission: RE | Admit: 2022-09-02 | Discharge: 2022-09-02 | Disposition: A | Discharge status: Home / Self Care | Payer: Medicaid Other | Source: Ambulatory Visit | Attending: Family Medicine | Admitting: Family Medicine

## 2022-09-02 VITALS — BP 132/80 | HR 95 | Wt 244.0 lb

## 2022-09-02 DIAGNOSIS — Z79899 Other long term (current) drug therapy: Secondary | ICD-10-CM | POA: Insufficient documentation

## 2022-09-02 DIAGNOSIS — E11628 Type 2 diabetes mellitus with other skin complications: Secondary | ICD-10-CM | POA: Diagnosis not present

## 2022-09-02 DIAGNOSIS — I2510 Atherosclerotic heart disease of native coronary artery without angina pectoris: Secondary | ICD-10-CM

## 2022-09-02 DIAGNOSIS — E118 Type 2 diabetes mellitus with unspecified complications: Secondary | ICD-10-CM | POA: Diagnosis not present

## 2022-09-02 DIAGNOSIS — Z89512 Acquired absence of left leg below knee: Secondary | ICD-10-CM | POA: Diagnosis not present

## 2022-09-02 DIAGNOSIS — N179 Acute kidney failure, unspecified: Secondary | ICD-10-CM | POA: Insufficient documentation

## 2022-09-02 DIAGNOSIS — Z951 Presence of aortocoronary bypass graft: Secondary | ICD-10-CM | POA: Insufficient documentation

## 2022-09-02 DIAGNOSIS — Z7982 Long term (current) use of aspirin: Secondary | ICD-10-CM | POA: Diagnosis not present

## 2022-09-02 DIAGNOSIS — I255 Ischemic cardiomyopathy: Secondary | ICD-10-CM | POA: Insufficient documentation

## 2022-09-02 DIAGNOSIS — Z7984 Long term (current) use of oral hypoglycemic drugs: Secondary | ICD-10-CM | POA: Insufficient documentation

## 2022-09-02 DIAGNOSIS — I493 Ventricular premature depolarization: Secondary | ICD-10-CM | POA: Diagnosis not present

## 2022-09-02 DIAGNOSIS — N183 Chronic kidney disease, stage 3 unspecified: Secondary | ICD-10-CM

## 2022-09-02 DIAGNOSIS — Z8719 Personal history of other diseases of the digestive system: Secondary | ICD-10-CM

## 2022-09-02 DIAGNOSIS — I5022 Chronic systolic (congestive) heart failure: Secondary | ICD-10-CM | POA: Diagnosis not present

## 2022-09-02 DIAGNOSIS — E785 Hyperlipidemia, unspecified: Secondary | ICD-10-CM | POA: Insufficient documentation

## 2022-09-02 DIAGNOSIS — I272 Pulmonary hypertension, unspecified: Secondary | ICD-10-CM | POA: Diagnosis not present

## 2022-09-02 DIAGNOSIS — I130 Hypertensive heart and chronic kidney disease with heart failure and stage 1 through stage 4 chronic kidney disease, or unspecified chronic kidney disease: Secondary | ICD-10-CM | POA: Insufficient documentation

## 2022-09-02 DIAGNOSIS — Z89619 Acquired absence of unspecified leg above knee: Secondary | ICD-10-CM

## 2022-09-02 DIAGNOSIS — E1122 Type 2 diabetes mellitus with diabetic chronic kidney disease: Secondary | ICD-10-CM | POA: Insufficient documentation

## 2022-09-02 DIAGNOSIS — Z87891 Personal history of nicotine dependence: Secondary | ICD-10-CM | POA: Diagnosis not present

## 2022-09-02 LAB — BRAIN NATRIURETIC PEPTIDE: B Natriuretic Peptide: 105.2 pg/mL — ABNORMAL HIGH (ref 0.0–100.0)

## 2022-09-02 LAB — COMPREHENSIVE METABOLIC PANEL
ALT: 18 U/L (ref 0–44)
AST: 16 U/L (ref 15–41)
Albumin: 3.8 g/dL (ref 3.5–5.0)
Alkaline Phosphatase: 87 U/L (ref 38–126)
Anion gap: 11 (ref 5–15)
BUN: 27 mg/dL — ABNORMAL HIGH (ref 6–20)
CO2: 24 mmol/L (ref 22–32)
Calcium: 9.3 mg/dL (ref 8.9–10.3)
Chloride: 103 mmol/L (ref 98–111)
Creatinine, Ser: 1.78 mg/dL — ABNORMAL HIGH (ref 0.61–1.24)
GFR, Estimated: 46 mL/min — ABNORMAL LOW (ref >60)
Glucose, Bld: 204 mg/dL — ABNORMAL HIGH (ref 70–99)
Potassium: 4.4 mmol/L (ref 3.5–5.1)
Sodium: 138 mmol/L (ref 135–145)
Total Bilirubin: 0.3 mg/dL (ref 0.3–1.2)
Total Protein: 7.2 g/dL (ref 6.5–8.1)

## 2022-09-02 LAB — TSH: TSH: 0.81 u[IU]/mL (ref 0.350–4.500)

## 2022-09-02 LAB — DIGOXIN LEVEL: Digoxin Level: 0.3 ng/mL — ABNORMAL LOW (ref 0.8–2.0)

## 2022-09-02 MED ORDER — EPLERENONE 25 MG PO TABS: 25.0000 mg | ORAL_TABLET | Freq: Every day | ORAL | 3 refills | Status: DC

## 2022-09-02 MED ORDER — FUROSEMIDE 40 MG PO TABS: 40.0000 mg | ORAL_TABLET | ORAL | 3 refills | Status: DC | PRN

## 2022-09-02 NOTE — Addendum Note (Signed)
Encounter addended by: Domingo Sep, RN on: 09/02/2022 9:26 AM  Actions taken: Charge Capture section accepted

## 2022-09-02 NOTE — Patient Instructions (Signed)
Medication Changes:  START. Eplerenone 25mg . Take one tablet, one time a day.   CHANGE: Lasix 20-40mg . Take 0.5-1 tablet daily as needed for weight gain of 3 pounds over night or 5 pounds in one week or increased swelling in abdomen or lower extremities.   Lab Work:  Labs done today, we will contact you for abnormal readings.  You have a lab draw appointment for Wednesday, Feb 14 @ 1:45pm to recheck your blood work.   Special Instructions // Education:  Do the following things EVERYDAY: Weigh yourself in the morning before breakfast. Write it down and keep it in a log. Take your medicines as prescribed Eat low salt foods--Limit salt (sodium) to 2000 mg per day.  Stay as active as you can everyday Limit all fluids for the day to less than 2 liters  Follow-Up in: Please call our office in [facility] to schedule your next follow up appointment with Dr. Aundra Dubin for April.       At the [facility], you and your health needs are our priority. We have a designated team specialized in the treatment of Heart Failure. This Care Team includes your primary Heart Failure Specialized Cardiologist (physician), Advanced Practice Providers (APPs- Physician Assistants and Nurse Practitioners), and Pharmacist who all work together to provide you with the care you need, when you need it.   You may see any of the following providers on your designated Care Team at your next follow up:  Dr. Glori Bickers Dr. Loralie Champagne Dr. Roxana Hires, NP Lyda Jester, [facility] [place], [place] Forestine Na, NP Audry Riles, PharmD   Please be sure to bring in all your medications bottles to every appointment.   Need to Contact us:  If you have any questions or concerns before your next appointment please send [place] a message through [place] or call our office at [phone].    TO LEAVE A MESSAGE FOR THE NURSE SELECT OPTION 2, PLEASE LEAVE A MESSAGE  INCLUDING: YOUR NAME DATE OF BIRTH CALL BACK NUMBER REASON FOR CALL**this is important as we prioritize the call backs  YOU WILL RECEIVE A CALL BACK THE SAME DAY AS LONG AS YOU CALL BEFORE 4:00 PM

## 2022-09-04 DIAGNOSIS — H269 Unspecified cataract: Secondary | ICD-10-CM | POA: Diagnosis not present

## 2022-09-04 DIAGNOSIS — H2589 Other age-related cataract: Secondary | ICD-10-CM | POA: Diagnosis not present

## 2022-09-09 ENCOUNTER — Ambulatory Visit (HOSPITAL_COMMUNITY)
Admission: RE | Admit: 2022-09-09 | Discharge: 2022-09-09 | Disposition: A | Discharge status: Home / Self Care | Payer: Medicaid Other | Source: Ambulatory Visit | Attending: Internal Medicine | Admitting: Internal Medicine

## 2022-09-09 DIAGNOSIS — I5022 Chronic systolic (congestive) heart failure: Secondary | ICD-10-CM | POA: Diagnosis not present

## 2022-09-09 LAB — BASIC METABOLIC PANEL
Anion gap: 7 (ref 5–15)
BUN: 34 mg/dL — ABNORMAL HIGH (ref 6–20)
CO2: 24 mmol/L (ref 22–32)
Calcium: 8.8 mg/dL — ABNORMAL LOW (ref 8.9–10.3)
Chloride: 104 mmol/L (ref 98–111)
Creatinine, Ser: 1.83 mg/dL — ABNORMAL HIGH (ref 0.61–1.24)
GFR, Estimated: 44 mL/min — ABNORMAL LOW (ref >60)
Glucose, Bld: 159 mg/dL — ABNORMAL HIGH (ref 70–99)
Potassium: 5.1 mmol/L (ref 3.5–5.1)
Sodium: 135 mmol/L (ref 135–145)

## 2022-09-25 DIAGNOSIS — Z419 Encounter for procedure for purposes other than remedying health state, unspecified: Secondary | ICD-10-CM | POA: Diagnosis not present

## 2022-09-28 ENCOUNTER — Other Ambulatory Visit: Payer: Self-pay

## 2022-10-01 ENCOUNTER — Encounter: Payer: Self-pay | Admitting: Internal Medicine

## 2022-10-01 ENCOUNTER — Ambulatory Visit: Payer: Medicaid Other | Attending: Internal Medicine | Admitting: Internal Medicine

## 2022-10-01 VITALS — BP 108/66 | HR 97 | Ht 72.0 in | Wt 249.2 lb

## 2022-10-01 DIAGNOSIS — I5022 Chronic systolic (congestive) heart failure: Secondary | ICD-10-CM

## 2022-10-01 NOTE — Patient Instructions (Signed)
Medication Instructions:  Your physician recommends that you continue on your current medications as directed. Please refer to the Current Medication list given to you today.  *If you need a refill on your cardiac medications before your next appointment, please call your pharmacy*  Testing/Procedures: Your physician has requested that you have an echocardiogram. Echocardiography is a painless test that uses sound waves to create images of your heart. It provides your doctor with information about the size and shape of your heart and how well your heart's chambers and valves are working. This procedure takes approximately one hour. There are no restrictions for this procedure. Please do NOT wear cologne, perfume, aftershave, or lotions (deodorant is allowed). Please arrive 15 minutes prior to your appointment time.  Follow-Up: At [facility], you and your health needs are our priority.  As part of our continuing mission to provide you with exceptional heart care, we have created designated Provider Care Teams.  These Care Teams include your primary Cardiologist (physician) and Advanced Practice Providers (APPs -  Physician Assistants and Nurse Practitioners) who all work together to provide you with the care you need, when you need it.  Your next appointment:   Based on results of your testing.

## 2022-10-01 NOTE — Progress Notes (Signed)
ELECTROPHYSIOLOGY CONSULT NOTE  Patient ID: Adam James, MRN: [id number], DOB/AGE: [birth year] 51 y.o. Admit date: (Not on file) Date of Consult: 10/01/2022  Primary Physician: Charlott Rakes, MD Primary Cardiologist: DM     Adam James is a 51 y.o. male who is being seen today for the evaluation of ICD at the request of DM.    HPI Adam James is a 52 y.o. male referred for consideration of an ICD.  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  He is nonambulatory.  No chest pain or shortness of breath.  No palpitations or syncope.   DATE TEST EF   4/22 cMRI 40%   8/22 Echo  25%   4/23 Echo  25%   10/23 Echo  30%    Date Cr K Hgb Dig  5/23   13.2   2/24 1.83 5.1   0.3             Past Medical History:  Diagnosis Date   Anemia    GI bleed; 03/13/11 EGD: relux esophagitis with ulceration/clot   CAD (coronary artery disease)    Cardiomyopathy (HCC)    CHF (congestive heart failure) (HCC)    CKD (chronic kidney disease)    Diabetic foot ulcer ([place])    Dyslipidemia    Dyspnea    08/05/21- not recently.   Dysrhythmia    PVCs   Heart failure (HCC)    Hypertension    Myocardial infarction ([place])    Type 2 diabetes mellitus ([place])       Surgical History:  Past Surgical History:  Procedure Laterality Date   AMPUTATION Left 04/30/2021   Procedure: LEFT FOOT 5TH RAY AMPUTATION APPLICATION OF A-CELL POWDER;  Surgeon: Newt Minion, MD;  Location: [facility];  Service: Orthopedics;  Laterality: Left;   AMPUTATION Left 07/30/2021   Procedure: LEFT BELOW KNEE AMPUTATION;  Surgeon: Newt Minion, MD;  Location: [facility];  Service: Orthopedics;  Laterality: Left;   APPENDECTOMY     APPLICATION OF WOUND VAC Left 03/03/2021   Procedure: APPLICATION OF WOUND VAC;  Surgeon: Leandrew Koyanagi, MD;  Location: [facility];  Service:  Orthopedics;  Laterality: Left;   APPLICATION OF WOUND VAC  03/07/2021   Procedure: APPLICATION OF WOUND VAC;  Surgeon: Newt Minion, MD;  Location: [facility];  Service: Orthopedics;;   APPLICATION OF WOUND VAC  07/30/2021   Procedure: APPLICATION OF WOUND VAC;  Surgeon: Newt Minion, MD;  Location: [place] [facility];  Service: Orthopedics;;   CARDIAC CATHETERIZATION     COLON SURGERY     CORONARY ARTERY BYPASS GRAFT N/A 05/22/2021   Procedure: CORONARY ARTERY BYPASS GRAFTING (CABG) X 3 USING LEFT INTERNAL MAMMARY ARTERY AND RIGHT ENDOSCOPIC GREATER SAPHENOUS VEIN CONDUITS;  Surgeon: Lajuana Matte, MD;  Location: [facility];  Service: Open Heart Surgery;  Laterality: N/A;   ENDOVEIN HARVEST OF GREATER SAPHENOUS VEIN Right 05/22/2021   Procedure: ENDOVEIN HARVEST OF GREATER SAPHENOUS VEIN;  Surgeon: Lajuana Matte, MD;  Location: [facility];  Service: Open Heart Surgery;  Laterality: Right;   ESOPHAGOGASTRODUODENOSCOPY (EGD) WITH PROPOFOL N/A 03/12/2021   Procedure: ESOPHAGOGASTRODUODENOSCOPY (EGD) WITH PROPOFOL;  Surgeon: Daryel November, MD;  Location: [facility];  Service: Gastroenterology;  Laterality: N/A;   I & D EXTREMITY Left 02/24/2021   Procedure: IRRIGATION AND DEBRIDEMENT EXTREMITY;  Surgeon: Altamese Blodgett Mills, MD;  Location: [facility];  Service: Orthopedics;  Laterality: Left;   I & D EXTREMITY Left 02/26/2021   Procedure: IRRIGATION AND DEBRIDEMENT OF LEG;  Surgeon: Newt Minion, MD;  Location: [facility];  Service: Orthopedics;  Laterality: Left;   I & D EXTREMITY Left 03/03/2021   Procedure: LEFT LEG IRRIGATION AND DEBRIDEMENT;  Surgeon: Leandrew Koyanagi, MD;  Location: [facility];  Service: Orthopedics;  Laterality: Left;   I & D EXTREMITY Left 03/07/2021   Procedure: REPEAT DEBRIDEMENT LEFT LEG;  Surgeon: Newt Minion, MD;  Location: [facility];  Service: Orthopedics;  Laterality: Left;   IR FLUORO GUIDE CV LINE RIGHT  03/10/2021   IR REMOVAL TUN CV CATH W/O FL  03/24/2021   IR US GUIDE VASC ACCESS RIGHT   03/10/2021   LEFT HEART CATH AND CORONARY ANGIOGRAPHY N/A 09/13/2020   Procedure: LEFT HEART CATH AND CORONARY ANGIOGRAPHY;  Surgeon: Troy Sine, MD;  Location: [facility];  Service: Cardiovascular;  Laterality: N/A;   LEFT HEART CATH AND CORONARY ANGIOGRAPHY N/A 03/28/2021   Procedure: LEFT HEART CATH AND CORONARY ANGIOGRAPHY;  Surgeon: Larey Dresser, MD;  Location: [facility];  Service: Cardiovascular;  Laterality: N/A;   RIGHT HEART CATH N/A 03/26/2021   Procedure: RIGHT HEART CATH;  Surgeon: Larey Dresser, MD;  Location: [facility];  Service: Cardiovascular;  Laterality: N/A;   STUMP REVISION Left 05/06/2022   Procedure: REVISION LEFT BELOW KNEE AMPUTATION;  Surgeon: Newt Minion, MD;  Location: [facility];  Service: Orthopedics;  Laterality: Left;   TEE WITHOUT CARDIOVERSION N/A 05/22/2021   Procedure: TRANSESOPHAGEAL ECHOCARDIOGRAM (TEE);  Surgeon: Lajuana Matte, MD;  Location: [facility];  Service: Open Heart Surgery;  Laterality: N/A;   TOE AMPUTATION  2020     Home Meds: Current Meds  Medication Sig   albuterol (VENTOLIN HFA) 108 (90 Base) MCG/ACT inhaler Inhale 2 puffs into the lungs every 4 (four) hours as needed for shortness of breath or wheezing.   amiodarone (PACERONE) 200 MG tablet Take 0.5 tablets (100 mg total) by mouth daily.   aspirin EC 81 MG tablet Take 1 tablet (81 mg total) by mouth daily.   atorvastatin (LIPITOR) 80 MG tablet Take 1 tablet (80 mg total) by mouth daily.   Blood Pressure Monitoring (BLOOD PRESSURE CUFF) MISC 1 each by Does not apply route as directed.   carvedilol (COREG) 3.125 MG tablet Take 1 tablet (3.125 mg total) by mouth 2 (two) times daily.   digoxin (LANOXIN) 0.125 MG tablet Take 1 tablet (0.125 mg total) by mouth daily.   empagliflozin (JARDIANCE) 10 MG TABS tablet Take 1 tablet (10 mg total) by mouth daily.   eplerenone (INSPRA) 25 MG tablet Take 1 tablet (25 mg total) by mouth daily.   furosemide (LASIX) 40 MG  tablet Take 1 tablet (40 mg total) by mouth as needed (take 0.5-1 tablet as needed for weight gain of 3 pounds over night or 5 pounds in one week or increased swelling in lower extremities.).   gabapentin (NEURONTIN) 300 MG capsule Take 1 capsule (300 mg total) by mouth at bedtime.   glipiZIDE (GLUCOTROL) 5 MG tablet Take 1 tablet (5 mg total) by mouth daily before breakfast.   glucose blood (TRUE METRIX BLOOD GLUCOSE TEST) test strip Use as instructed twice daily   sacubitril-valsartan (ENTRESTO) 24-26 MG Take 1 tablet by mouth 2 (two) times daily.   Semaglutide,0.25 or 0.'5MG'$ /DOS, (  OZEMPIC, 0.25 OR 0.5 MG/DOSE,) 2 MG/3ML SOPN Inject 0.25 mg into the skin once a week.   TRUEplus Lancets 28G MISC Use as directed twice daily at 8 am and 10 pm.   venlafaxine XR (EFFEXOR-XR) 75 MG 24 hr capsule Take 1 capsule (75 mg total) by mouth daily with breakfast.    Allergies:  Allergies  Allergen Reactions   Morphine Itching    Social History   Socioeconomic History   Marital status: Single    Spouse name: Not on file   Number of children: 1   Years of education: Not on file   Highest education level: 8th grade  Occupational History   Occupation: none    Comment: former "[employer]"  Tobacco Use   Smoking status: Former    Packs/day: 3.00    Years: 36.00    Total pack years: 108.00    Types: Cigarettes    Quit date: 08/27/2020    Years since quitting: 2.0   Smokeless tobacco: Never  Vaping Use   Vaping Use: Never used  Substance and Sexual Activity   Alcohol use: Not Currently    Comment: Very rare   Drug use: Not Currently    Types: Marijuana   Sexual activity: Not on file  Other Topics Concern   Not on file  Social History Narrative   Not on file   Social Determinants of Health   Financial Resource Strain: High Risk (02/20/2022)   Overall Financial Resource Strain (CARDIA)    Difficulty of Paying Living Expenses: Hard  Food Insecurity: Food Insecurity Present (02/04/2022)   Hunger  Vital Sign    Worried About Running Out of Food in the Last Year: Sometimes true    Ran Out of Food in the Last Year: Sometimes true  Transportation Needs: No Transportation Needs (11/06/2021)   PRAPARE - Hydrologist (Medical): No    Lack of Transportation (Non-Medical): No  Physical Activity: Not on file  Stress: Not on file  Social Connections: Not on file  Intimate Partner Violence: Not on file     Family History  Problem Relation Age of Onset   Hypertension Mother    Heart failure Father    Hypertension Brother    Congestive Heart Failure Brother      ROS:  Please see the history of present illness.     All other systems reviewed and negative.    Physical Exam:  Blood pressure 108/66, pulse 97, height 6' (1.829 m), weight 249 lb 3.2 oz (113 kg), SpO2 98 %. General: Well developed, well nourished male in no acute distress. Malodor in the room, stump inspected and looked fine  not able to determine the source of odor Head: Normocephalic, atraumatic, sclera non-icteric, no xanthomas, nares are without discharge. EENT: normal  Lymph Nodes:  none Neck: Negative for carotid bruits. JVD not elevated. Back:without scoliosis kyphosis  Lungs: Clear bilaterally to auscultation without wheezes, rales, or rhonchi. Breathing is unlabored. Heart: RRR with S1 S2. No /6 systolic murmur . No rubs, or gallops appreciated. Abdomen: Soft, non-tender, non-distended with normoactive bowel sounds. No hepatomegaly. No rebound/guarding. No obvious abdominal masses. Msk:  Strength and tone appear normal for age. Extremities: No clubbing or cyanosis. No edema.  Distal pedal pulses are 2+ and equal bilaterally.  Status post right BKA Skin: Warm and Dry Neuro: Alert and oriented X 3. CN III-XII intact Grossly normal sensory and motor function . Psych:  Responds to questions appropriately with a  normal affect.        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  There had been interval improvement from 20-25%---30% and there is some reason to be optimistic that there has been further improvement in LV function.  Prior to proceeding with ICD, we need to clarify his LV function.  Will order an echo.  He takes amiodarone for his PVCs.  Surveillance laboratories were normal in 2/24    Virl Axe

## 2022-10-08 DIAGNOSIS — E7800 Pure hypercholesterolemia, unspecified: Secondary | ICD-10-CM | POA: Diagnosis not present

## 2022-10-08 DIAGNOSIS — Z79899 Other long term (current) drug therapy: Secondary | ICD-10-CM | POA: Diagnosis not present

## 2022-10-08 DIAGNOSIS — H21562 Pupillary abnormality, left eye: Secondary | ICD-10-CM | POA: Diagnosis not present

## 2022-10-08 DIAGNOSIS — Z87891 Personal history of nicotine dependence: Secondary | ICD-10-CM | POA: Diagnosis not present

## 2022-10-08 DIAGNOSIS — E1136 Type 2 diabetes mellitus with diabetic cataract: Secondary | ICD-10-CM | POA: Diagnosis not present

## 2022-10-08 DIAGNOSIS — Z885 Allergy status to narcotic agent status: Secondary | ICD-10-CM | POA: Diagnosis not present

## 2022-10-08 DIAGNOSIS — I2510 Atherosclerotic heart disease of native coronary artery without angina pectoris: Secondary | ICD-10-CM | POA: Diagnosis not present

## 2022-10-08 DIAGNOSIS — H2589 Other age-related cataract: Secondary | ICD-10-CM | POA: Diagnosis not present

## 2022-10-09 DIAGNOSIS — H25811 Combined forms of age-related cataract, right eye: Secondary | ICD-10-CM | POA: Diagnosis not present

## 2022-10-10 DIAGNOSIS — Z01810 Encounter for preprocedural cardiovascular examination: Secondary | ICD-10-CM | POA: Diagnosis not present

## 2022-10-10 DIAGNOSIS — I517 Cardiomegaly: Secondary | ICD-10-CM | POA: Diagnosis not present

## 2022-10-26 ENCOUNTER — Other Ambulatory Visit: Payer: Self-pay

## 2022-10-26 DIAGNOSIS — Z419 Encounter for procedure for purposes other than remedying health state, unspecified: Secondary | ICD-10-CM | POA: Diagnosis not present

## 2022-10-28 ENCOUNTER — Other Ambulatory Visit: Payer: Self-pay

## 2022-10-28 DIAGNOSIS — Z89512 Acquired absence of left leg below knee: Secondary | ICD-10-CM

## 2022-10-30 ENCOUNTER — Ambulatory Visit (HOSPITAL_COMMUNITY): Payer: Medicaid Other

## 2022-10-30 ENCOUNTER — Other Ambulatory Visit: Payer: Self-pay

## 2022-11-03 DIAGNOSIS — Z89512 Acquired absence of left leg below knee: Secondary | ICD-10-CM | POA: Diagnosis not present

## 2022-11-04 ENCOUNTER — Telehealth: Payer: Self-pay | Admitting: Internal Medicine

## 2022-11-04 NOTE — Telephone Encounter (Signed)
Adam James from [facility] called stating they need a copy of prior echos ("additional clinical information") for prior auth for pt's upcoming echo on  11/26/22. She ask that it be uploaded on [email or web address] or faxed   Fax: [phone]  Tracking number [id number]

## 2022-11-09 ENCOUNTER — Other Ambulatory Visit: Payer: Self-pay

## 2022-11-11 ENCOUNTER — Telehealth: Payer: Self-pay | Admitting: Orthopedic Surgery

## 2022-11-11 NOTE — Telephone Encounter (Signed)
Patient called in stating he would like his PT referral switched over to somewhere in [place] please advise it is closer for him to get to

## 2022-11-12 NOTE — Telephone Encounter (Signed)
Thi spt was referred to [facility] neuro rehab for prosthetic gait training. He states that he wants to go somewhere closer to [employer]. Do you know if there ie somewhere he can go that accepts medicaid insurance? Please advise.

## 2022-11-18 ENCOUNTER — Other Ambulatory Visit: Payer: Self-pay

## 2022-11-18 DIAGNOSIS — Z89512 Acquired absence of left leg below knee: Secondary | ICD-10-CM

## 2022-11-18 NOTE — Telephone Encounter (Signed)
What location he wants to go somewhere near [place]? What do I select in the referral?

## 2022-11-18 NOTE — Telephone Encounter (Signed)
done

## 2022-11-18 NOTE — Telephone Encounter (Signed)
Just do it as internal and comment somewhere in [place]- I will take care of it.

## 2022-11-19 DIAGNOSIS — E785 Hyperlipidemia, unspecified: Secondary | ICD-10-CM | POA: Diagnosis not present

## 2022-11-19 DIAGNOSIS — Z6831 Body mass index (BMI) 31.0-31.9, adult: Secondary | ICD-10-CM | POA: Diagnosis not present

## 2022-11-19 DIAGNOSIS — H25811 Combined forms of age-related cataract, right eye: Secondary | ICD-10-CM | POA: Diagnosis not present

## 2022-11-19 DIAGNOSIS — H21561 Pupillary abnormality, right eye: Secondary | ICD-10-CM | POA: Diagnosis not present

## 2022-11-19 DIAGNOSIS — E1136 Type 2 diabetes mellitus with diabetic cataract: Secondary | ICD-10-CM | POA: Diagnosis not present

## 2022-11-19 DIAGNOSIS — I2510 Atherosclerotic heart disease of native coronary artery without angina pectoris: Secondary | ICD-10-CM | POA: Diagnosis not present

## 2022-11-19 DIAGNOSIS — Z79899 Other long term (current) drug therapy: Secondary | ICD-10-CM | POA: Diagnosis not present

## 2022-11-19 DIAGNOSIS — Z87891 Personal history of nicotine dependence: Secondary | ICD-10-CM | POA: Diagnosis not present

## 2022-11-19 DIAGNOSIS — Z885 Allergy status to narcotic agent status: Secondary | ICD-10-CM | POA: Diagnosis not present

## 2022-11-25 DIAGNOSIS — Z419 Encounter for procedure for purposes other than remedying health state, unspecified: Secondary | ICD-10-CM | POA: Diagnosis not present

## 2022-11-26 ENCOUNTER — Ambulatory Visit (HOSPITAL_COMMUNITY): Payer: Medicaid Other | Attending: Internal Medicine

## 2022-11-26 DIAGNOSIS — I5022 Chronic systolic (congestive) heart failure: Secondary | ICD-10-CM | POA: Insufficient documentation

## 2022-11-26 LAB — ECHOCARDIOGRAM COMPLETE
Area-P 1/2: 4.35 cm2
S' Lateral: 4 cm

## 2022-11-26 MED ORDER — PERFLUTREN LIPID MICROSPHERE
1.0000 mL | INTRAVENOUS | Status: AC | PRN
  Administered 2022-11-26: 2 mL via INTRAVENOUS

## 2022-12-02 ENCOUNTER — Ambulatory Visit: Payer: Medicaid Other | Attending: Family Medicine | Admitting: Family Medicine

## 2022-12-02 ENCOUNTER — Emergency Department (HOSPITAL_COMMUNITY)
Admission: EM | Admit: 2022-12-02 | Discharge: 2022-12-02 | Disposition: A | Discharge status: Home / Self Care | Payer: Medicaid Other | Attending: Emergency Medicine | Admitting: Emergency Medicine

## 2022-12-02 ENCOUNTER — Other Ambulatory Visit: Payer: Self-pay

## 2022-12-02 ENCOUNTER — Emergency Department (HOSPITAL_COMMUNITY): Payer: Medicaid Other

## 2022-12-02 ENCOUNTER — Encounter: Payer: Self-pay | Admitting: Family Medicine

## 2022-12-02 VITALS — BP 90/65 | HR 146 | Ht 75.0 in | Wt 260.6 lb

## 2022-12-02 DIAGNOSIS — R000 Tachycardia, unspecified: Secondary | ICD-10-CM | POA: Diagnosis not present

## 2022-12-02 DIAGNOSIS — I2510 Atherosclerotic heart disease of native coronary artery without angina pectoris: Secondary | ICD-10-CM | POA: Insufficient documentation

## 2022-12-02 DIAGNOSIS — I4719 Other supraventricular tachycardia: Secondary | ICD-10-CM | POA: Diagnosis not present

## 2022-12-02 DIAGNOSIS — Z7982 Long term (current) use of aspirin: Secondary | ICD-10-CM | POA: Insufficient documentation

## 2022-12-02 DIAGNOSIS — I5022 Chronic systolic (congestive) heart failure: Secondary | ICD-10-CM

## 2022-12-02 DIAGNOSIS — I470 Re-entry ventricular arrhythmia: Secondary | ICD-10-CM | POA: Diagnosis not present

## 2022-12-02 DIAGNOSIS — I110 Hypertensive heart disease with heart failure: Secondary | ICD-10-CM | POA: Diagnosis not present

## 2022-12-02 DIAGNOSIS — I471 Supraventricular tachycardia, unspecified: Secondary | ICD-10-CM

## 2022-12-02 DIAGNOSIS — I509 Heart failure, unspecified: Secondary | ICD-10-CM | POA: Diagnosis not present

## 2022-12-02 DIAGNOSIS — E1142 Type 2 diabetes mellitus with diabetic polyneuropathy: Secondary | ICD-10-CM | POA: Diagnosis not present

## 2022-12-02 DIAGNOSIS — N1832 Chronic kidney disease, stage 3b: Secondary | ICD-10-CM | POA: Diagnosis not present

## 2022-12-02 DIAGNOSIS — I25118 Atherosclerotic heart disease of native coronary artery with other forms of angina pectoris: Secondary | ICD-10-CM

## 2022-12-02 DIAGNOSIS — I959 Hypotension, unspecified: Secondary | ICD-10-CM | POA: Diagnosis not present

## 2022-12-02 DIAGNOSIS — Z79899 Other long term (current) drug therapy: Secondary | ICD-10-CM | POA: Diagnosis not present

## 2022-12-02 DIAGNOSIS — I739 Peripheral vascular disease, unspecified: Secondary | ICD-10-CM

## 2022-12-02 DIAGNOSIS — R739 Hyperglycemia, unspecified: Secondary | ICD-10-CM | POA: Diagnosis not present

## 2022-12-02 DIAGNOSIS — F17210 Nicotine dependence, cigarettes, uncomplicated: Secondary | ICD-10-CM | POA: Diagnosis not present

## 2022-12-02 DIAGNOSIS — E1122 Type 2 diabetes mellitus with diabetic chronic kidney disease: Secondary | ICD-10-CM | POA: Insufficient documentation

## 2022-12-02 DIAGNOSIS — Z951 Presence of aortocoronary bypass graft: Secondary | ICD-10-CM | POA: Diagnosis not present

## 2022-12-02 DIAGNOSIS — Z7985 Long-term (current) use of injectable non-insulin antidiabetic drugs: Secondary | ICD-10-CM

## 2022-12-02 DIAGNOSIS — N189 Chronic kidney disease, unspecified: Secondary | ICD-10-CM | POA: Diagnosis not present

## 2022-12-02 DIAGNOSIS — D72829 Elevated white blood cell count, unspecified: Secondary | ICD-10-CM | POA: Diagnosis not present

## 2022-12-02 DIAGNOSIS — I4891 Unspecified atrial fibrillation: Secondary | ICD-10-CM | POA: Insufficient documentation

## 2022-12-02 DIAGNOSIS — I129 Hypertensive chronic kidney disease with stage 1 through stage 4 chronic kidney disease, or unspecified chronic kidney disease: Secondary | ICD-10-CM

## 2022-12-02 DIAGNOSIS — E1159 Type 2 diabetes mellitus with other circulatory complications: Secondary | ICD-10-CM | POA: Diagnosis not present

## 2022-12-02 DIAGNOSIS — Z7984 Long term (current) use of oral hypoglycemic drugs: Secondary | ICD-10-CM

## 2022-12-02 DIAGNOSIS — Z743 Need for continuous supervision: Secondary | ICD-10-CM | POA: Diagnosis not present

## 2022-12-02 DIAGNOSIS — R9431 Abnormal electrocardiogram [ECG] [EKG]: Secondary | ICD-10-CM | POA: Diagnosis not present

## 2022-12-02 LAB — DIGOXIN LEVEL: Digoxin Level: 0.2 ng/mL — ABNORMAL LOW (ref 0.8–2.0)

## 2022-12-02 LAB — BASIC METABOLIC PANEL
Anion gap: 11 (ref 5–15)
BUN: 25 mg/dL — ABNORMAL HIGH (ref 6–20)
CO2: 23 mmol/L (ref 22–32)
Calcium: 9.2 mg/dL (ref 8.9–10.3)
Chloride: 103 mmol/L (ref 98–111)
Creatinine, Ser: 1.81 mg/dL — ABNORMAL HIGH (ref 0.61–1.24)
GFR, Estimated: 45 mL/min — ABNORMAL LOW (ref >60)
Glucose, Bld: 234 mg/dL — ABNORMAL HIGH (ref 70–99)
Potassium: 4.3 mmol/L (ref 3.5–5.1)
Sodium: 137 mmol/L (ref 135–145)

## 2022-12-02 LAB — CBC
HCT: 47.2 % (ref 39.0–52.0)
Hemoglobin: 15.2 g/dL (ref 13.0–17.0)
MCH: 29.1 pg (ref 26.0–34.0)
MCHC: 32.2 g/dL (ref 30.0–36.0)
MCV: 90.2 fL (ref 80.0–100.0)
Platelets: 290 10*3/uL (ref 150–400)
RBC: 5.23 MIL/uL (ref 4.22–5.81)
RDW: 13.2 % (ref 11.5–15.5)
WBC: 12.9 10*3/uL — ABNORMAL HIGH (ref 4.0–10.5)
nRBC: 0 % (ref 0.0–0.2)

## 2022-12-02 LAB — TROPONIN I (HIGH SENSITIVITY)
Troponin I (High Sensitivity): 13 ng/L (ref <18)
Troponin I (High Sensitivity): 15 ng/L (ref <18)

## 2022-12-02 LAB — POCT GLYCOSYLATED HEMOGLOBIN (HGB A1C): HbA1c, POC (controlled diabetic range): 9.5 % — AB (ref 0.0–7.0)

## 2022-12-02 LAB — POCT ABI - SCREENING FOR PILOT NO CHARGE

## 2022-12-02 LAB — MAGNESIUM: Magnesium: 2 mg/dL (ref 1.7–2.4)

## 2022-12-02 MED ORDER — AMIODARONE LOAD VIA INFUSION
150.0000 mg | Freq: Once | INTRAVENOUS | Status: AC
  Administered 2022-12-02: 150 mg via INTRAVENOUS
  Filled 2022-12-02: qty 83.34

## 2022-12-02 MED ORDER — OZEMPIC (0.25 OR 0.5 MG/DOSE) 2 MG/3ML ~~LOC~~ SOPN: 0.5000 mg | PEN_INJECTOR | SUBCUTANEOUS | 0 refills | Status: DC

## 2022-12-02 MED ORDER — AMIODARONE HCL 200 MG PO TABS: 200.0000 mg | ORAL_TABLET | Freq: Every day | ORAL | 3 refills | Status: DC

## 2022-12-02 MED ORDER — AMIODARONE HCL IN DEXTROSE 360-4.14 MG/200ML-% IV SOLN
60.0000 mg/h | INTRAVENOUS | Status: DC
  Administered 2022-12-02: 60 mg/h via INTRAVENOUS
  Filled 2022-12-02: qty 200

## 2022-12-02 MED ORDER — AMIODARONE HCL IN DEXTROSE 360-4.14 MG/200ML-% IV SOLN: 30.0000 mg/h | INTRAVENOUS | Status: DC

## 2022-12-02 MED ORDER — SODIUM CHLORIDE 0.9 % IV BOLUS
500.0000 mL | Freq: Once | INTRAVENOUS | Status: AC
  Administered 2022-12-02: 500 mL via INTRAVENOUS

## 2022-12-02 MED ORDER — SEMAGLUTIDE (1 MG/DOSE) 4 MG/3ML ~~LOC~~ SOPN
1.0000 mg | PEN_INJECTOR | SUBCUTANEOUS | 3 refills | Status: DC
Start: 2022-12-02 — End: 2023-05-07

## 2022-12-02 NOTE — ED Triage Notes (Signed)
Patient is coming from PCP for tachycardia during visit for standard lab draw, 150 HR but asymptomatic, no chest pain, no complaints. Patient has extensive cardiac and medical history.

## 2022-12-02 NOTE — ED Provider Notes (Signed)
[facility] EMERGENCY DEPARTMENT AT [name] Provider Note   CSN: [id number] Arrival date & time: 12/02/22  1323     History  Chief Complaint  Patient presents with   Tachycardia    Alexys Orris is a 52 y.o. 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  Patient is asymptomatic.  He was at a routine visit and was getting triaged when he was noted to have heart rate in the 150s.  He states he does not have any shortness of breath or chest pain.  He was notably having soft blood pressures and was transported via EMS.  HPI     Home Medications Prior to Admission medications   Medication Sig Start Date End Date Taking? Authorizing Provider  albuterol (VENTOLIN HFA) 108 (90 Base) MCG/ACT inhaler Inhale 2 puffs into the lungs every 4 (four) hours as needed for shortness of breath or wheezing. 10/09/21   [provider]  amiodarone (PACERONE) 200 MG tablet Take 0.5 tablets (100 mg total) by mouth daily. 08/19/22   Jacklynn Ganong, FNP  aspirin EC 81 MG tablet Take 1 tablet (81 mg total) by mouth daily. 08/19/22   Milford, Anderson Malta, FNP  atorvastatin (LIPITOR) 80 MG tablet Take 1 tablet (80 mg total) by mouth daily. 08/19/22   Jacklynn Ganong, FNP  Blood Pressure Monitoring (BLOOD PRESSURE CUFF) MISC 1 each by Does not apply route as directed. 08/31/22   Anders Simmonds, PA-C  carvedilol (COREG) 3.125 MG tablet Take 1 tablet (3.125 mg total) by mouth 2 (two) times daily. 08/19/22   Milford, Anderson Malta, FNP  digoxin (LANOXIN) 0.125 MG tablet Take 1 tablet (0.125 mg total) by mouth daily. 08/19/22   Milford, Anderson Malta, FNP  empagliflozin (JARDIANCE) 10 MG TABS tablet Take 1 tablet (10 mg total) by mouth daily. 08/31/22   Anders Simmonds, PA-C  eplerenone (INSPRA) 25 MG  tablet Take 1 tablet (25 mg total) by mouth daily. 09/02/22   Milford, Anderson Malta, FNP  furosemide (LASIX) 40 MG tablet Take 1 tablet (40 mg total) by mouth as needed (take 0.5-1 tablet as needed for weight gain of 3 pounds over night or 5 pounds in one week or increased swelling in lower extremities.). 09/02/22 12/01/22  Jacklynn Ganong, FNP  gabapentin (NEURONTIN) 300 MG capsule Take 1 capsule (300 mg total) by mouth at bedtime. 08/31/22   Anders Simmonds, PA-C  glipiZIDE (GLUCOTROL) 5 MG tablet Take 1 tablet (5 mg total) by mouth daily before breakfast. 08/31/22   Sharon Seller, Marzella Schlein, PA-C  glucose blood (TRUE METRIX BLOOD GLUCOSE TEST) test strip Use as instructed twice daily 05/12/21   Mayers, Cari S, PA-C  sacubitril-valsartan (ENTRESTO) 24-26 MG Take 1 tablet by mouth 2 (two) times daily. 04/28/22   Laurey Morale, MD  Semaglutide, 1 MG/DOSE, 4 MG/3ML SOPN Inject 1 mg as directed once a week. 12/02/22   Hoy Register, MD  Semaglutide,0.25 or 0.5MG /DOS, (OZEMPIC, 0.25 OR 0.5 MG/DOSE,) 2 MG/3ML SOPN Inject 0.5 mg into the skin once a week. For 4 weeks then increase to 1mg  12/02/22   Hoy Register, MD  TRUEplus Lancets 28G MISC Use as directed twice daily at 8 am and 10 pm. 05/12/21   Mayers, Cari S, PA-C  venlafaxine XR (EFFEXOR-XR) 75 MG 24 hr  capsule Take 1 capsule (75 mg total) by mouth daily with breakfast. 08/31/22   Anders Simmonds, PA-C      Allergies    Morphine    Review of Systems   Review of Systems  Physical Exam Updated Vital Signs SpO2 99%  Physical Exam  ED Results / Procedures / Treatments   Labs (all labs ordered are listed, but only abnormal results are displayed) Labs Reviewed - No data to display  EKG None  Radiology No results found.  Procedures .Critical Care  Performed by: Arthor Captain, PA-C Authorized by: Arthor Captain, PA-C   Critical care provider statement:    Critical care time (minutes):  60   Critical care time was exclusive of:  Separately  billable procedures and treating other patients   Critical care was necessary to treat or prevent imminent or life-threatening deterioration of the following conditions:  Cardiac failure   Critical care was time spent personally by me on the following activities:  Development of treatment plan with patient or surrogate, discussions with consultants, evaluation of patient's response to treatment, examination of patient, ordering and review of laboratory studies, ordering and review of radiographic studies, ordering and performing treatments and interventions, pulse oximetry, re-evaluation of patient's condition and review of old charts     Medications Ordered in ED Medications - No data to display  ED Course/ Medical Decision Making/ A&P Clinical Course as of 12/02/22 1635  Wed Dec 02, 2022  1520 Creatinine(!): 1.81 Creatinine at baseline [AH]  1520 WBC(!): 12.9 Mild elevation in white count [AH]  1520 Troponin I (High Sensitivity): 13 Initial troponin within normal limits [AH]  1521 Sinus tachycardia at a rate of 153 [AH]    Clinical Course User Index [AH] Arthor Captain, PA-C                             Medical Decision Making 52 year old male who presents emergency department with chief complaint of tachycardia  Amount and/or Complexity of Data Reviewed Labs: ordered. Decision-making details documented in ED Course. Radiology: ordered.  Risk Prescription drug management.   This patient presents to the ED for concern of tachycardia, this involves an extensive number of treatment options, and is a complaint that carries with it a high risk of complications and morbidity.  SVT, A-fib with RVR, atrial flutter  Co morbidities:  extensive medical history including previous CABG/MI on amiodarone and dig  Social Determinants of Health:   strong family support  Additional history:  {Additional history obtained from EMR   Lab Tests:  I Ordered, and personally interpreted  labs.  The pertinent results include:   WBC 12.9 CR 1.81- baseline Troponin is negative  Imaging Studies:  I ordered imaging studies including portable 1 view chest x-ray I independently visualized and interpreted imaging which showed no acute finding I agree with the radiologist interpretation  Cardiac Monitoring/ECG:  The patient was maintained on a cardiac monitor.  I personally viewed and interpreted the cardiac monitored which showed an underlying rhythm of:  Sinus tachycardia, soft blood pressures  Medicines ordered and prescription drug management:  I ordered medication including  Medications  amiodarone (NEXTERONE) 1.8 mg/mL load via infusion 150 mg (150 mg Intravenous Bolus from Bag 12/02/22 1643)    Followed by  amiodarone (NEXTERONE PREMIX) 360-4.14 MG/200ML-% (1.8 mg/mL) IV infusion (0 mg/hr Intravenous Stopped 12/02/22 1852)    Followed by  amiodarone (NEXTERONE PREMIX) 360-4.14 MG/200ML-% (1.8 mg/mL) IV  infusion (has no administration in time range)  sodium chloride 0.9 % bolus 500 mL (0 mLs Intravenous Stopped 12/02/22 1549)   for tachycardia Reevaluation of the patient after these medicines showed that the patient resolved I have reviewed the patients home medicines and have made adjustments as needed  Test Considered:    Critical Interventions:  fluids amiodorone bolus and drip  Consultations Obtained: Cardiology-Case discussed with Dr. Bynum Bellows see.  After much discussion it appears the patient has AV nodal reentrant tachycardia and not atrial flutter.  Initial thoughts were to anticoagulate however this was stopped.  Problem List / ED Course:     ICD-10-CM   1. 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  Given fluids, amiodarone bolus and drip with conversion of his abnormal rhythm.  He has been asymptomatic throughout his visit.  Appreciate cardiology bedside consult.  Case discussed with PA  Rolly Salter and Dr. Eldridge Dace patient will be discharged on full dose amiodarone 200 mg daily with very close follow-up and strict return precautions Dispostion:  After consideration of the diagnostic results and the patients response to treatment, I feel that the patent would benefit from discharge.         Final Clinical Impression(s) / ED Diagnoses Final diagnoses:  None    Rx / DC Orders ED Discharge Orders     None         Arthor Captain, PA-C 12/02/22 1926    Linwood Dibbles, MD 12/03/22 [phone]

## 2022-12-02 NOTE — Progress Notes (Addendum)
Subjective:  Patient ID: Adam James, male    DOB: Jan 09, 1971  Age: 52 y.o. MRN: [id number]  CC: Diabetes   HPI Adam James is a 52 y.o. year old male with a history of  HFrEF (EF 25 to 30% from echo on 11/2022), CABG (in 04/2022), SVT, type 2 diabetes mellitus (A1c 9.5), stage III CKD, left BKA (in 07/2021), depression, nicotine dependence (quit in 2022)  Interval History:  His heart rate is elevated at 148 bpm and he states he is not used to his prosthesis and walked with it here today and he is not used to walking with it which could have caused an elevated heart rate.  His blood pressure is low and he denies feeling dizzy.  He has no chest pain or dyspnea.  He is scheduled for a cardiac evaluation to determine if he is a candidate for ICD. Echo from 11/2022 revealed EF of 25 to 30%, severely decreased LV function with left ventricular regional wall motion abnormality mild LVH, grade 1 DD  He is not happy that he has been unable to lose weight with Ozempic.  He remains on the 0.25 mg and was supposed to follow-up in 3 months but never did.  He does have ongoing neuropathy in his right foot but denies presence of sharp pains.  He remains on gabapentin. Past Medical History:  Diagnosis Date   Anemia    GI bleed; 03/13/11 EGD: relux esophagitis with ulceration/clot   CAD (coronary artery disease)    Cardiomyopathy (HCC)    CHF (congestive heart failure) (HCC)    CKD (chronic kidney disease)    Diabetic foot ulcer (HCC)    Dyslipidemia    Dyspnea    08/05/21- not recently.   Dysrhythmia    PVCs   Heart failure (HCC)    Hypertension    Myocardial infarction (HCC)    Type 2 diabetes mellitus (HCC)     Past Surgical History:  Procedure Laterality Date   AMPUTATION Left 04/30/2021   Procedure: LEFT FOOT 5TH RAY AMPUTATION APPLICATION OF A-CELL POWDER;  Surgeon: [name], MD;  Location: [facility] OR;  Service: Orthopedics;  Laterality: Left;   AMPUTATION Left 07/30/2021    Procedure: LEFT BELOW KNEE AMPUTATION;  Surgeon: [name], MD;  Location: [facility] OR;  Service: Orthopedics;  Laterality: Left;   APPENDECTOMY     APPLICATION OF WOUND VAC Left 03/03/2021   Procedure: APPLICATION OF WOUND VAC;  Surgeon: Tarry Kos, MD;  Location: [facility] OR;  Service: Orthopedics;  Laterality: Left;   APPLICATION OF WOUND VAC  03/07/2021   Procedure: APPLICATION OF WOUND VAC;  Surgeon: [name], MD;  Location: [facility] OR;  Service: Orthopedics;;   APPLICATION OF WOUND VAC  07/30/2021   Procedure: APPLICATION OF WOUND VAC;  Surgeon: [name], MD;  Location: [facility] OR;  Service: Orthopedics;;   CARDIAC CATHETERIZATION     COLON SURGERY     CORONARY ARTERY BYPASS GRAFT N/A 05/22/2021   Procedure: CORONARY ARTERY BYPASS GRAFTING (CABG) X 3 USING LEFT INTERNAL MAMMARY ARTERY AND RIGHT ENDOSCOPIC GREATER SAPHENOUS VEIN CONDUITS;  Surgeon: Corliss Skains, MD;  Location: [facility] OR;  Service: Open Heart Surgery;  Laterality: N/A;   ENDOVEIN HARVEST OF GREATER SAPHENOUS VEIN Right 05/22/2021   Procedure: ENDOVEIN HARVEST OF GREATER SAPHENOUS VEIN;  Surgeon: Corliss Skains, MD;  Location: [facility] OR;  Service: Open Heart Surgery;  Laterality: Right;   ESOPHAGOGASTRODUODENOSCOPY (EGD) WITH PROPOFOL N/A 03/12/2021  Procedure: ESOPHAGOGASTRODUODENOSCOPY (EGD) WITH PROPOFOL;  Surgeon: Jenel Lucks, MD;  Location: [facility] ENDOSCOPY;  Service: Gastroenterology;  Laterality: N/A;   I & D EXTREMITY Left 02/24/2021   Procedure: IRRIGATION AND DEBRIDEMENT EXTREMITY;  Surgeon: Myrene Galas, MD;  Location: [facility] OR;  Service: Orthopedics;  Laterality: Left;   I & D EXTREMITY Left 02/26/2021   Procedure: IRRIGATION AND DEBRIDEMENT OF LEG;  Surgeon: [name], MD;  Location: [facility] OR;  Service: Orthopedics;  Laterality: Left;   I & D EXTREMITY Left 03/03/2021   Procedure: LEFT LEG IRRIGATION AND DEBRIDEMENT;  Surgeon: Tarry Kos, MD;  Location: [facility] OR;  Service: Orthopedics;  Laterality: Left;   I & D  EXTREMITY Left 03/07/2021   Procedure: REPEAT DEBRIDEMENT LEFT LEG;  Surgeon: [name], MD;  Location: [facility] OR;  Service: Orthopedics;  Laterality: Left;   IR FLUORO GUIDE CV LINE RIGHT  03/10/2021   IR REMOVAL TUN CV CATH W/O FL  03/24/2021   IR US GUIDE VASC ACCESS RIGHT  03/10/2021   LEFT HEART CATH AND CORONARY ANGIOGRAPHY N/A 09/13/2020   Procedure: LEFT HEART CATH AND CORONARY ANGIOGRAPHY;  Surgeon: Lennette Bihari, MD;  Location: [facility] INVASIVE CV LAB;  Service: Cardiovascular;  Laterality: N/A;   LEFT HEART CATH AND CORONARY ANGIOGRAPHY N/A 03/28/2021   Procedure: LEFT HEART CATH AND CORONARY ANGIOGRAPHY;  Surgeon: Laurey Morale, MD;  Location: [facility] INVASIVE CV LAB;  Service: Cardiovascular;  Laterality: N/A;   RIGHT HEART CATH N/A 03/26/2021   Procedure: RIGHT HEART CATH;  Surgeon: Laurey Morale, MD;  Location: [facility] INVASIVE CV LAB;  Service: Cardiovascular;  Laterality: N/A;   STUMP REVISION Left 05/06/2022   Procedure: REVISION LEFT BELOW KNEE AMPUTATION;  Surgeon: [name], MD;  Location: [facility] OR;  Service: Orthopedics;  Laterality: Left;   TEE WITHOUT CARDIOVERSION N/A 05/22/2021   Procedure: TRANSESOPHAGEAL ECHOCARDIOGRAM (TEE);  Surgeon: Corliss Skains, MD;  Location: [facility] OR;  Service: Open Heart Surgery;  Laterality: N/A;   TOE AMPUTATION  2020    Family History  Problem Relation Age of Onset   Hypertension Mother    Heart failure Father    Hypertension Brother    Congestive Heart Failure Brother     Social History   Socioeconomic History   Marital status: Single    Spouse name: Not on file   Number of children: 1   Years of education: Not on file   Highest education level: 8th grade  Occupational History   Occupation: none    Comment: former "[employer]"  Tobacco Use   Smoking status: Former    Packs/day: 3.00    Years: 36.00    Additional pack years: 0.00    Total pack years: 108.00    Types: Cigarettes    Quit date: 08/27/2020    Years since quitting:  2.2   Smokeless tobacco: Never  Vaping Use   Vaping Use: Never used  Substance and Sexual Activity   Alcohol use: Not Currently    Comment: Very rare   Drug use: Not Currently    Types: Marijuana   Sexual activity: Not on file  Other Topics Concern   Not on file  Social History Narrative   Not on file   Social Determinants of Health   Financial Resource Strain: High Risk (02/20/2022)   Overall Financial Resource Strain (CARDIA)    Difficulty of Paying Living Expenses: Hard  Food Insecurity: Food Insecurity Present (02/04/2022)   Hunger Vital Sign  Worried About Programme researcher, broadcasting/film/video in the Last Year: Sometimes true    [employer] of Food in the Last Year: Sometimes true  Transportation Needs: No Transportation Needs (11/06/2021)   PRAPARE - Administrator, Civil Service (Medical): No    Lack of Transportation (Non-Medical): No  Physical Activity: Not on file  Stress: Not on file  Social Connections: Not on file    Allergies  Allergen Reactions   Morphine Itching    Outpatient Medications Prior to Visit  Medication Sig Dispense Refill   albuterol (VENTOLIN HFA) 108 (90 Base) MCG/ACT inhaler Inhale 2 puffs into the lungs every 4 (four) hours as needed for shortness of breath or wheezing.     amiodarone (PACERONE) 200 MG tablet Take 0.5 tablets (100 mg total) by mouth daily. 30 tablet 3   aspirin EC 81 MG tablet Take 1 tablet (81 mg total) by mouth daily. 30 tablet 3   atorvastatin (LIPITOR) 80 MG tablet Take 1 tablet (80 mg total) by mouth daily. 30 tablet 3   Blood Pressure Monitoring (BLOOD PRESSURE CUFF) MISC 1 each by Does not apply route as directed. 1 each 0   carvedilol (COREG) 3.125 MG tablet Take 1 tablet (3.125 mg total) by mouth 2 (two) times daily. 60 tablet 3   digoxin (LANOXIN) 0.125 MG tablet Take 1 tablet (0.125 mg total) by mouth daily. 30 tablet 3   empagliflozin (JARDIANCE) 10 MG TABS tablet Take 1 tablet (10 mg total) by mouth daily. 30 tablet 3    eplerenone (INSPRA) 25 MG tablet Take 1 tablet (25 mg total) by mouth daily. 30 tablet 3   gabapentin (NEURONTIN) 300 MG capsule Take 1 capsule (300 mg total) by mouth at bedtime. 90 capsule 1   glipiZIDE (GLUCOTROL) 5 MG tablet Take 1 tablet (5 mg total) by mouth daily before breakfast. 90 tablet 2   glucose blood (TRUE METRIX BLOOD GLUCOSE TEST) test strip Use as instructed twice daily 100 each 12   sacubitril-valsartan (ENTRESTO) 24-26 MG Take 1 tablet by mouth 2 (two) times daily. 180 tablet 3   TRUEplus Lancets 28G MISC Use as directed twice daily at 8 am and 10 pm. 100 each 11   venlafaxine XR (EFFEXOR-XR) 75 MG 24 hr capsule Take 1 capsule (75 mg total) by mouth daily with breakfast. 90 capsule 1   Semaglutide,0.25 or 0.5MG /DOS, (OZEMPIC, 0.25 OR 0.5 MG/DOSE,) 2 MG/3ML SOPN Inject 0.25 mg into the skin once a week. 3 mL 6   furosemide (LASIX) 40 MG tablet Take 1 tablet (40 mg total) by mouth as needed (take 0.5-1 tablet as needed for weight gain of 3 pounds over night or 5 pounds in one week or increased swelling in lower extremities.). 30 tablet 3   No facility-administered medications prior to visit.     ROS Review of Systems  Constitutional:  Negative for activity change and appetite change.  HENT:  Negative for sinus pressure and sore throat.   Respiratory:  Negative for chest tightness, shortness of breath and wheezing.   Cardiovascular:  Negative for chest pain and palpitations.  Gastrointestinal:  Negative for abdominal distention, abdominal pain and constipation.  Genitourinary: Negative.   Musculoskeletal: Negative.   Neurological:  Positive for numbness.  Psychiatric/Behavioral:  Negative for behavioral problems and dysphoric mood.     Objective:  BP 90/65   Pulse (!) 146   Ht 6\' 3"  (1.905 m)   Wt 260 lb 9.6 oz (118.2 kg)  SpO2 95%   BMI 32.57 kg/m      12/02/2022   11:28 AM 10/01/2022    2:51 PM 09/02/2022    8:42 AM  BP/Weight  Systolic BP 90 108 132   Diastolic BP 65 66 80  Wt. (Lbs) 260.6 249.2 244  BMI 32.57 kg/m2 33.8 kg/m2 33.09 kg/m2      Physical Exam Constitutional:      Appearance: He is well-developed.  Cardiovascular:     Rate and Rhythm: Tachycardia present.     Heart sounds: Normal heart sounds. No murmur heard. Pulmonary:     Effort: Pulmonary effort is normal.     Breath sounds: Normal breath sounds. No wheezing or rales.  Chest:     Chest wall: No tenderness.  Abdominal:     General: Bowel sounds are normal. There is no distension.     Palpations: Abdomen is soft. There is no mass.     Tenderness: There is no abdominal tenderness.  Musculoskeletal:        General: Normal range of motion.     Right lower leg: No edema.     Comments: Left BKA  Neurological:     Mental Status: He is alert and oriented to person, place, and time.  Psychiatric:        Mood and Affect: Mood normal.        Latest Ref Rng & Units 09/09/2022    1:59 PM 09/02/2022    9:13 AM 08/19/2022    9:13 AM  CMP  Glucose 70 - 99 mg/dL 829  562  130   BUN 6 - 20 mg/dL 34  27  28   Creatinine 0.61 - 1.24 mg/dL 8.65  7.84  6.96   Sodium 135 - 145 mmol/L 135  138  138   Potassium 3.5 - 5.1 mmol/L 5.1  4.4  4.6   Chloride 98 - 111 mmol/L 104  103  106   CO2 22 - 32 mmol/L 24  24  25    Calcium 8.9 - 10.3 mg/dL 8.8  9.3  9.0   Total Protein 6.5 - 8.1 g/dL  7.2    Total Bilirubin 0.3 - 1.2 mg/dL  0.3    Alkaline Phos 38 - 126 U/L  87    AST 15 - 41 U/L  16    ALT 0 - 44 U/L  18      Lipid Panel     Component Value Date/Time   CHOL 167 04/28/2022 1113   CHOL 167 10/07/2020 1004   TRIG 159 (H) 04/28/2022 1113   HDL 36 (L) 04/28/2022 1113   HDL 36 (L) 10/07/2020 1004   CHOLHDL 4.6 04/28/2022 1113   VLDL 32 04/28/2022 1113   LDLCALC 99 04/28/2022 1113   LDLCALC 108 (H) 10/07/2020 1004    CBC    Component Value Date/Time   WBC 9.8 12/08/2021 1509   WBC 8.6 11/06/2021 0120   RBC 4.62 12/08/2021 1509   RBC 3.53 (L) 11/06/2021  0120   HGB 13.2 12/08/2021 1509   HCT 38.0 12/08/2021 1509   PLT 261 12/08/2021 1509   MCV 82 12/08/2021 1509   MCH 28.6 12/08/2021 1509   MCH 28.0 11/06/2021 0120   MCHC 34.7 12/08/2021 1509   MCHC 32.5 11/06/2021 0120   RDW 14.2 12/08/2021 1509   LYMPHSABS 3.6 (H) 12/08/2021 1509   MONOABS 0.5 11/06/2021 0120   EOSABS 0.2 12/08/2021 1509   BASOSABS 0.1 12/08/2021 1509    Lab  Results  Component Value Date   HGBA1C 9.5 (A) 12/02/2022    Assessment & Plan:  1. Type 2 diabetes mellitus with other circulatory complication, without long-term current use of insulin (HCC) Uncontrolled with A1c of 9.5 Increase Ozempic from 0.25 to 0.5 mg and he will see the clinical pharmacist in 4 weeks to uptitrate He has also been referred for the [employer] study Counseled on Diabetic diet, my plate method, 161 minutes of moderate intensity exercise/week Blood sugar logs with fasting goals of 80-120 mg/dl, random of less than 096 and in the event of sugars less than 60 mg/dl or greater than 045 mg/dl encouraged to notify the clinic. Advised on the need for annual eye exams, annual foot exams, Pneumonia vaccine. - POCT glycosylated hemoglobin (Hb A1C) - AMB Referral to Pharmacy Medication Management - Semaglutide,0.25 or 0.5MG /DOS, (OZEMPIC, 0.25 OR 0.5 MG/DOSE,) 2 MG/3ML SOPN; Inject 0.5 mg into the skin once a week. For 4 weeks then increase to 1mg   Dispense: 3 mL; Refill: 0 - CMP14+EGFR - LP+Non-HDL Cholesterol - Semaglutide, 1 MG/DOSE, 4 MG/3ML SOPN; Inject 1 mg as directed once a week.  Dispense: 3 mL; Refill: 3  2. Smoking greater than 20 pack years Quit in 2022 Due to history of smoking he is a candidate for lung cancer screening until 2037 - CT CHEST LUNG CANCER SCREENING LOW DOSE WO CONTRAST; Future  3. 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 Diabetic polyneuropathy associated with type 2 diabetes mellitus (HCC) Uncontrolled Currently on Gabapentin  5. Hypertensive heart disease with chronic systolic congestive heart failure (HCC) EF of 25 to 30% Being evaluated to see if he is a candidate for ICD placement He remains on Entresto, SGLT2i, beta-blocker, Inspra Continue to follow-up with cardiology  6. Peripheral arterial disease (HCC) Noticed on in office ABI Prolonged smoking did place him at a high risk in addition to his left BKA - POCT ABI Screening for Pilot No Charge - Ambulatory referral to Vascular Surgery  7. SVT (supraventricular tachycardia) Heart rate of 148, repeat was 151 EKG revealed SVT, HR of 148 Currently in SVT, hypotensive Will refer to ED stat EMS called   Meds ordered this encounter  Medications   Semaglutide,0.25 or 0.5MG /DOS, (OZEMPIC, 0.25 OR 0.5 MG/DOSE,) 2 MG/3ML SOPN    Sig: Inject 0.5 mg into the skin once a week. For 4 weeks then increase to 1mg     Dispense:  3 mL    Refill:  0   Semaglutide, 1 MG/DOSE, 4 MG/3ML SOPN    Sig: Inject 1 mg as directed once a week.    Dispense:  3 mL    Refill:  3    Follow-up: Return in about 1 month (around 01/02/2023) for Blood sugar evaluation with Franky Macho, Chronic medical conditions in 3 months.    Visit required 49 minutes of patient care including median intraservice time, reviewing previous notes and test results, coordination of care, counseling the patient in addition to management of chronic medical conditions.Time also spent ordering medications, investigations and documenting in the chart.  All questions were answered to the patient's satisfaction    Hoy Register, MD, FAAFP. [facility] [place], [place] [phone]   12/02/2022, 12:47  PM

## 2022-12-02 NOTE — Discharge Instructions (Addendum)
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 You should follow-up very closely with Dr. Shirlee Latch as soon as possible. Please take your medication as directed.   Get help right away if: You have chest pain. Your symptoms get worse. You have trouble breathing. You have an episode of SVT that lasts longer than 20 minutes. You faint. These symptoms may represent a serious problem that is an emergency. Do not wait to see if the symptoms will go away. Get medical help right away. Call your local emergency services (911 in the [place] Do not drive yourself to the hospital.

## 2022-12-02 NOTE — Consult Note (Addendum)
Cardiology Consultation   Patient ID: Adam James MRN: [id number]; DOB: [birth year]  Admit date: 12/02/2022 Date of Consult: 12/02/2022  PCP:  Hoy Register, MD    [facility] Providers Cardiologist:  Norman Herrlich, MD  Electrophysiologist:  Sherryl Manges, MD  {  Patient Profile:   Adam James is a 52 y.o. 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 Jeraldine Loots.  History of Present Illness:   Mr. Radach has extensive cardiac history. In February 2022  had left heart catheterization indicating multivessel disease.  Cardiac MRI was ordered and showed viable myocardium with plans to undergo CABG. In the interim patient had left foot infection complicated by osteomyelitis and necrotizing fasciitis and had multiple limb debridement. Once stable patient underwent CABG surgery in October 2022 with LIMA-LAD, SVG-OM1, SVG-OM3. PDA too small to graft.  Post CABG patient developed PVCs/VT and has been on amiodarone ever since.  Since then patient has had no complaints and feels great. 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  Due to underlying CAD patient has had known ischemic cardiomyopathy with a last known echo done this month indicating LVEF of 25 to 30% that has remained reduced despite GDMT.  Although reduced EF,  patient has had no significant symptoms and no impairment with ADLs.  He has been off of his Lasix dosing and has not complained of any issues with fluid.  Due to reduced EF he was recommended for ICD.  He was last seen by Dr. Graciela Husbands in March 2024 who wanted to reevaluate his LVEF to make better determination.   Patient was seen today in office by his PCP who  noted elevated heart rate and performed EKG with concerns of Aflutter with rates 150-180.  On arrival to the ED EKG was reviewed by Dr. 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 He has been started on an amio bolus with a drip and converted back to normal sinus by the time I saw him.  Patient reports doing well with no symptoms whatsoever.  Denies any chest pain, palpitations, shortness of breath, swelling in the legs, dizziness. Past Medical History:  Diagnosis Date   Anemia    GI bleed; 03/13/11 EGD: relux esophagitis with ulceration/clot   CAD (coronary artery disease)    Cardiomyopathy (HCC)    CHF (congestive heart failure) (HCC)    CKD (chronic kidney disease)    Diabetic foot ulcer (HCC)    Dyslipidemia    Dyspnea    08/05/21- not recently.   Dysrhythmia    PVCs   Heart failure (HCC)    Hypertension    Myocardial infarction (HCC)    Type 2 diabetes mellitus (HCC)     Past Surgical History:  Procedure Laterality Date   AMPUTATION Left 04/30/2021   Procedure: LEFT FOOT 5TH RAY AMPUTATION APPLICATION OF A-CELL POWDER;  Surgeon: [name], MD;  Location: [facility] OR;  Service: Orthopedics;  Laterality: Left;   AMPUTATION Left 07/30/2021   Procedure: LEFT BELOW KNEE AMPUTATION;  Surgeon: [name], MD;  Location: [facility] OR;  Service: Orthopedics;  Laterality: Left;   APPENDECTOMY     APPLICATION OF WOUND VAC Left 03/03/2021   Procedure: APPLICATION OF WOUND VAC;  Surgeon:  Tarry Kos, MD;  Location: [facility] OR;  Service: Orthopedics;  Laterality: Left;   APPLICATION OF WOUND VAC  03/07/2021   Procedure: APPLICATION OF WOUND VAC;  Surgeon: [name], MD;  Location: [facility] OR;  Service: Orthopedics;;   APPLICATION OF WOUND VAC  07/30/2021   Procedure: APPLICATION OF WOUND VAC;  Surgeon: [name], MD;  Location: [facility] OR;  Service: Orthopedics;;   CARDIAC CATHETERIZATION     COLON SURGERY     CORONARY ARTERY BYPASS GRAFT N/A 05/22/2021   Procedure: CORONARY ARTERY BYPASS  GRAFTING (CABG) X 3 USING LEFT INTERNAL MAMMARY ARTERY AND RIGHT ENDOSCOPIC GREATER SAPHENOUS VEIN CONDUITS;  Surgeon: Corliss Skains, MD;  Location: [facility] OR;  Service: Open Heart Surgery;  Laterality: N/A;   ENDOVEIN HARVEST OF GREATER SAPHENOUS VEIN Right 05/22/2021   Procedure: ENDOVEIN HARVEST OF GREATER SAPHENOUS VEIN;  Surgeon: Corliss Skains, MD;  Location: [facility] OR;  Service: Open Heart Surgery;  Laterality: Right;   ESOPHAGOGASTRODUODENOSCOPY (EGD) WITH PROPOFOL N/A 03/12/2021   Procedure: ESOPHAGOGASTRODUODENOSCOPY (EGD) WITH PROPOFOL;  Surgeon: Jenel Lucks, MD;  Location: [facility] ENDOSCOPY;  Service: Gastroenterology;  Laterality: N/A;   I & D EXTREMITY Left 02/24/2021   Procedure: IRRIGATION AND DEBRIDEMENT EXTREMITY;  Surgeon: Myrene Galas, MD;  Location: [facility] OR;  Service: Orthopedics;  Laterality: Left;   I & D EXTREMITY Left 02/26/2021   Procedure: IRRIGATION AND DEBRIDEMENT OF LEG;  Surgeon: [name], MD;  Location: [facility] OR;  Service: Orthopedics;  Laterality: Left;   I & D EXTREMITY Left 03/03/2021   Procedure: LEFT LEG IRRIGATION AND DEBRIDEMENT;  Surgeon: Tarry Kos, MD;  Location: [facility] OR;  Service: Orthopedics;  Laterality: Left;   I & D EXTREMITY Left 03/07/2021   Procedure: REPEAT DEBRIDEMENT LEFT LEG;  Surgeon: [name], MD;  Location: [facility] OR;  Service: Orthopedics;  Laterality: Left;   IR FLUORO GUIDE CV LINE RIGHT  03/10/2021   IR REMOVAL TUN CV CATH W/O FL  03/24/2021   IR US GUIDE VASC ACCESS RIGHT  03/10/2021   LEFT HEART CATH AND CORONARY ANGIOGRAPHY N/A 09/13/2020   Procedure: LEFT HEART CATH AND CORONARY ANGIOGRAPHY;  Surgeon: Lennette Bihari, MD;  Location: [facility] INVASIVE CV LAB;  Service: Cardiovascular;  Laterality: N/A;   LEFT HEART CATH AND CORONARY ANGIOGRAPHY N/A 03/28/2021   Procedure: LEFT HEART CATH AND CORONARY ANGIOGRAPHY;  Surgeon: Laurey Morale, MD;  Location: [facility] INVASIVE CV LAB;  Service: Cardiovascular;  Laterality: N/A;   RIGHT HEART CATH  N/A 03/26/2021   Procedure: RIGHT HEART CATH;  Surgeon: Laurey Morale, MD;  Location: [facility] INVASIVE CV LAB;  Service: Cardiovascular;  Laterality: N/A;   STUMP REVISION Left 05/06/2022   Procedure: REVISION LEFT BELOW KNEE AMPUTATION;  Surgeon: [name], MD;  Location: [facility] OR;  Service: Orthopedics;  Laterality: Left;   TEE WITHOUT CARDIOVERSION N/A 05/22/2021   Procedure: TRANSESOPHAGEAL ECHOCARDIOGRAM (TEE);  Surgeon: Corliss Skains, MD;  Location: [facility] OR;  Service: Open Heart Surgery;  Laterality: N/A;   TOE AMPUTATION  2020     Inpatient Medications: Scheduled Meds:  amiodarone  150 mg Intravenous Once   Continuous Infusions:  amiodarone     Followed by   amiodarone     PRN Meds:   Allergies:    Allergies  Allergen Reactions   Morphine Itching    Social History:   Social History   Socioeconomic History   Marital status: Single    Spouse  name: Not on file   Number of children: 1   Years of education: Not on file   Highest education level: 8th grade  Occupational History   Occupation: none    Comment: former "[employer]"  Tobacco Use   Smoking status: Former    Packs/day: 3.00    Years: 36.00    Additional pack years: 0.00    Total pack years: 108.00    Types: Cigarettes    Quit date: 08/27/2020    Years since quitting: 2.2   Smokeless tobacco: Never  Vaping Use   Vaping Use: Never used  Substance and Sexual Activity   Alcohol use: Not Currently    Comment: Very rare   Drug use: Not Currently    Types: Marijuana   Sexual activity: Not on file  Other Topics Concern   Not on file  Social History Narrative   Not on file   Social Determinants of Health   Financial Resource Strain: High Risk (02/20/2022)   Overall Financial Resource Strain (CARDIA)    Difficulty of Paying Living Expenses: Hard  Food Insecurity: Food Insecurity Present (02/04/2022)   Hunger Vital Sign    Worried About Running Out of Food in the Last Year: Sometimes true    Ran Out  of Food in the Last Year: Sometimes true  Transportation Needs: No Transportation Needs (11/06/2021)   PRAPARE - Administrator, Civil Service (Medical): No    Lack of Transportation (Non-Medical): No  Physical Activity: Not on file  Stress: Not on file  Social Connections: Not on file  Intimate Partner Violence: Not on file    Family History:   Family History  Problem Relation Age of Onset   Hypertension Mother    Heart failure Father    Hypertension Brother    Congestive Heart Failure Brother      ROS:  Please see the history of present illness.  All other ROS reviewed and negative.     Physical Exam/Data:   Vitals:   12/02/22 1329 12/02/22 1344 12/02/22 1345 12/02/22 1530  BP:  91/66 91/66 107/63  Pulse:  (!) 153 (!) 152 (!) 145  Resp:  (!) 28 (!) 28 16  Temp:  99.9 F (37.7 C)    TempSrc:  Oral    SpO2: 99% 99% 97% 96%   No intake or output data in the 24 hours ending 12/02/22 1642    12/02/2022   11:28 AM 10/01/2022    2:51 PM 09/02/2022    8:42 AM  Last 3 Weights  Weight (lbs) 260 lb 9.6 oz 249 lb 3.2 oz 244 lb  Weight (kg) 118.207 kg 113.036 kg 110.678 kg     There is no height or weight on file to calculate BMI.  General:  Well nourished, well developed, in no acute distress HEENT: normal Neck: no JVD Vascular: No carotid bruits; Distal pulses 2+ bilaterally Cardiac:  normal S1, S2; RRR; no murmur  Lungs:  clear to auscultation bilaterally, no wheezing, rhonchi or rales  Abd: soft, nontender, no hepatomegaly  Ext: no edema Musculoskeletal: Left BKA Skin: warm and dry  Neuro:  CNs 2-12 intact, no focal abnormalities noted Psych:  Normal affect   EKG:  The EKG was personally reviewed and demonstrates: Initial EKG showing SVT (AVNRT).  Repeat EKG pending after seeing NSR on telemetry. Most recent EKG showing NSR HR 88. LBBB, similar to previous readings.  Telemetry:  Telemetry was personally reviewed and demonstrates: Normal sinus rhythm with a  heart rate of 88  Relevant CV Studies: Echocardiogram 11/26/2022 1. Anteroseptal, inferoseptal, and inferior hypokinesis. Left ventricular  ejection fraction, by estimation, is 25 to 30%. The left ventricle has  severely decreased function. The left ventricle demonstrates regional wall  motion abnormalities (see scoring   diagram/findings for description). There is mild concentric left  ventricular hypertrophy. Left ventricular diastolic parameters are  consistent with Grade I diastolic dysfunction (impaired relaxation).   2. Right ventricular systolic function is normal. The right ventricular  size is normal.   3. The mitral valve is normal in structure. No evidence of mitral valve  regurgitation. No evidence of mitral stenosis.   4. The aortic valve is tricuspid. Aortic valve regurgitation is mild. No  aortic stenosis is present.   5. The inferior vena cava is normal in size with greater than 50%  respiratory variability, suggesting right atrial pressure of 3 mmHg.   Left heart catheterization 03/28/2021  Ost LAD to Mid LAD lesion is 90% stenosed.   Mid LAD to Dist LAD lesion is 90% stenosed.   Dist LAD lesion is 100% stenosed.   Prox Cx lesion is 70% stenosed.   Prox RCA to Mid RCA lesion is 100% stenosed.   Dist RCA lesion is 100% stenosed.   Ramus lesion is 70% stenosed.   1st Mrg lesion is 50% stenosed.   Mid LM to Dist LM lesion is 60% stenosed.   Coronary angiography looks very similar to prior study in February.  Will review with Dr. Cliffton Asters, plan for eventual CABG when leg lesions have healed.   Laboratory Data:  High Sensitivity Troponin:   Recent Labs  Lab 12/02/22 1344  TROPONINIHS 13     Chemistry Recent Labs  Lab 12/02/22 1344  NA 137  K 4.3  CL 103  CO2 23  GLUCOSE 234*  BUN 25*  CREATININE 1.81*  CALCIUM 9.2  GFRNONAA 45*  ANIONGAP 11    No results for input(s): "PROT", "ALBUMIN", "AST", "ALT", "ALKPHOS", "BILITOT" in the last 168 hours. Lipids  No results for input(s): "CHOL", "TRIG", "HDL", "LABVLDL", "LDLCALC", "CHOLHDL" in the last 168 hours.  Hematology Recent Labs  Lab 12/02/22 1344  WBC 12.9*  RBC 5.23  HGB 15.2  HCT 47.2  MCV 90.2  MCH 29.1  MCHC 32.2  RDW 13.2  PLT 290   Thyroid No results for input(s): "TSH", "FREET4" in the last 168 hours.  BNPNo results for input(s): "BNP", "PROBNP" in the last 168 hours.  DDimer No results for input(s): "DDIMER" in the last 168 hours.   Radiology/Studies:  DG Chest Port 1 View  Result Date: 12/02/2022 CLINICAL DATA:  Tachycardia EXAM: PORTABLE CHEST 1 VIEW COMPARISON:  Portable exam 1404 hours compared to 11/04/2021 FINDINGS: Normal heart size post CABG. Mediastinal contours and pulmonary vascularity normal. Lungs clear. No pulmonary infiltrate, pleural effusion, or pneumothorax. Slight elevation of RIGHT diaphragm. Osseous structures unremarkable. IMPRESSION: No acute abnormalities. Electronically Signed   By: Ulyses Southward M.D.   On: 12/02/2022 14:15     Assessment and Plan:   SVT (AVNRT) Hx of VT post CABG Presented asymptomatically after EKG showed abnormality in PCPs office.  Heart rates were between 150-180.  Patient was started on Amio bolus and currently on a drip.  Has now converted back to normal sinus rhythm on telemetry/EKG.  Heart rate currently 80s. May need to increase oral amiodarone dosing. Recommend increase to full tablet. PTA amiodarone 200 mg half a tablet daily  Would finish infusion then  likely can be DC home since asx and conversion back to NSR.  CAD status post CABG in 2022 (LIMA-LAD, SVG-OM1, SVG-OM3. PDA too small to graft.) Stable disease without any complaints of chest pain or shortness of breath.  ADLs not significantly limited.   Continue aspirin 81 mg, atorvastatin 80 mg,  Chronic HFrEF Appears stable despite reduced function.  Echocardiogram this month indicating LVEF of 25 to 30%. Has plans for ICD and will follow up with Dr. Graciela Husbands.  Patient  asymptomatic and without any impairments in ADLs.  Euvolemic on exam. Continue Entresto 24-26 mg twice daily, carvedilol 3.125 mg twice daily, digoxin 0.125 mg daily, Jardiance 10 mg daily, eplerenone 25 mg daily Patient is to follow-up with Dr. Graciela Husbands for ICD consideration.  Will reach out to scheduler to get scheduled with electrophysiology.  Hyperlipidemia Continue atorvastatin 80 mg daily  Diabetes mellitus CKD stage III   Risk Assessment/Risk Scores:  New York Heart Association (NYHA) Functional Class NYHA Class II   For questions or updates, please contact [facility] Please consult [email or web address] for contact info under    Signed, Abagail Kitchens, PA-C  12/02/2022 4:42 PM   I have examined the patient and reviewed assessment and plan and discussed with patient.  Agree with above as stated.    Patient with tachycardia but was asymptomatic.  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  No need for anticoagulation.  He received IV amiodarone and converted to normal sinus rhythm.  Will increase his dose of amiodarone to 200 mg daily for at least a short time.  Likely, amiodarone dose at home was being limited to minimize risk of long-term toxicity.  He already sees electrophysiology as he is under consideration for AICD.  The AVNRT can be discussed at that time as well.  He is now asymptomatic.  He is back in sinus rhythm.  Okay for discharge.  Lance Muss

## 2022-12-03 LAB — CMP14+EGFR
ALT: 21 IU/L (ref 0–44)
AST: 21 IU/L (ref 0–40)
Albumin/Globulin Ratio: 1.5 (ref 1.2–2.2)
Albumin: 4 g/dL — ABNORMAL LOW (ref 4.1–5.1)
Alkaline Phosphatase: 120 IU/L (ref 44–121)
BUN/Creatinine Ratio: 15 (ref 9–20)
BUN: 25 mg/dL — ABNORMAL HIGH (ref 6–24)
Bilirubin Total: 0.4 mg/dL (ref 0.0–1.2)
CO2: 21 mmol/L (ref 20–29)
Calcium: 9.5 mg/dL (ref 8.7–10.2)
Chloride: 101 mmol/L (ref 96–106)
Creatinine, Ser: 1.62 mg/dL — ABNORMAL HIGH (ref 0.76–1.27)
Globulin, Total: 2.6 g/dL (ref 1.5–4.5)
Glucose: 246 mg/dL — ABNORMAL HIGH (ref 70–99)
Potassium: 4.8 mmol/L (ref 3.5–5.2)
Sodium: 139 mmol/L (ref 134–144)
Total Protein: 6.6 g/dL (ref 6.0–8.5)
eGFR: 51 mL/min/{1.73_m2} — ABNORMAL LOW (ref >59)

## 2022-12-03 LAB — LP+NON-HDL CHOLESTEROL
Cholesterol, Total: 209 mg/dL — ABNORMAL HIGH (ref 100–199)
HDL: 38 mg/dL — ABNORMAL LOW (ref >39)
LDL Chol Calc (NIH): 116 mg/dL — ABNORMAL HIGH (ref 0–99)
Total Non-HDL-Chol (LDL+VLDL): 171 mg/dL — ABNORMAL HIGH (ref 0–129)
Triglycerides: 318 mg/dL — ABNORMAL HIGH (ref 0–149)
VLDL Cholesterol Cal: 55 mg/dL — ABNORMAL HIGH (ref 5–40)

## 2022-12-04 ENCOUNTER — Other Ambulatory Visit: Payer: Self-pay | Admitting: Family Medicine

## 2022-12-04 ENCOUNTER — Other Ambulatory Visit (HOSPITAL_COMMUNITY): Payer: Self-pay

## 2022-12-04 ENCOUNTER — Other Ambulatory Visit: Payer: Self-pay

## 2022-12-04 DIAGNOSIS — I739 Peripheral vascular disease, unspecified: Secondary | ICD-10-CM

## 2022-12-04 MED ORDER — ROSUVASTATIN CALCIUM 20 MG PO TABS
20.0000 mg | ORAL_TABLET | Freq: Every day | ORAL | 1 refills | Status: DC
  Filled 2022-12-04 – 2022-12-08 (×2): qty 90, 90d supply, fill #0

## 2022-12-07 ENCOUNTER — Other Ambulatory Visit: Payer: Self-pay

## 2022-12-08 ENCOUNTER — Other Ambulatory Visit (HOSPITAL_COMMUNITY): Payer: Self-pay

## 2022-12-08 ENCOUNTER — Ambulatory Visit (HOSPITAL_COMMUNITY): Payer: Medicaid Other | Attending: Cardiology

## 2022-12-08 ENCOUNTER — Other Ambulatory Visit: Payer: Self-pay

## 2022-12-09 ENCOUNTER — Encounter: Payer: Medicaid Other | Admitting: Vascular Surgery

## 2022-12-09 ENCOUNTER — Telehealth: Payer: Self-pay | Admitting: Pharmacist

## 2022-12-09 NOTE — Telephone Encounter (Signed)
LIBERATE Study  Received referral for patient participation in the LIBERATE CGM Study. Contacted patient to discuss study and confirmed HIPAA identifiers. Confirmed patient was provided the LIBERATE Study Information Sheet and any questions were answered.   Confirmed that patient meets study criteria by having a diagnosis of Type 2 Diabetes, is not currently on insulin, and most recent A1c is >8%.  Patient provided verbal consent to participate in the study. Consent documented in electronic medical record.   @[email or web address] @  - Confirmed that patient has a compatible smart phone to [employer] app. ([email or web address]) - Asked to download and create a Josephine Igo account prior to first study visit.  - Scheduled first study visit on 12/15/2022. Confirmed patient has transportation to this appointment.  - Discussed use of MyChart in this study. Confirmed patient has an active MyChart account and is aware of their log in information.  Patient aware of pre-visit questionnaire that will be sent 2 days prior to their scheduled study visit and they will plan to complete before the visit.   Butch Penny, PharmD, Patsy Baltimore, CPP Clinical Pharmacist [facility] [phone]

## 2022-12-15 ENCOUNTER — Encounter: Payer: Medicaid Other | Admitting: Pharmacist

## 2022-12-16 ENCOUNTER — Ambulatory Visit (HOSPITAL_COMMUNITY)
Admission: RE | Admit: 2022-12-16 | Discharge: 2022-12-16 | Disposition: A | Discharge status: Home / Self Care | Payer: Medicaid Other | Source: Ambulatory Visit | Attending: Vascular Surgery | Admitting: Vascular Surgery

## 2022-12-16 ENCOUNTER — Encounter: Payer: Self-pay | Admitting: Vascular Surgery

## 2022-12-16 ENCOUNTER — Ambulatory Visit (INDEPENDENT_AMBULATORY_CARE_PROVIDER_SITE_OTHER): Payer: Medicaid Other | Admitting: Vascular Surgery

## 2022-12-16 VITALS — BP 114/82 | HR 88 | Temp 98.0°F | Resp 20 | Ht 75.0 in | Wt 260.0 lb

## 2022-12-16 DIAGNOSIS — Z89512 Acquired absence of left leg below knee: Secondary | ICD-10-CM

## 2022-12-16 DIAGNOSIS — I739 Peripheral vascular disease, unspecified: Secondary | ICD-10-CM

## 2022-12-16 LAB — VAS US ABI WITH/WO TBI: Right ABI: 1.02

## 2022-12-16 NOTE — Progress Notes (Signed)
Patient ID: Adam James, male   DOB: May 29, 1971, 52 y.o.   MRN: [id number]  Reason for Consult: New Patient (Initial Visit)   Referred by Hoy Register, MD  Subjective:     HPI:  Adam James is a 52 y.o. 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  He now walks with the help of a prosthetic.  He does not have any right lower extremity previous procedures in the wound.  Denies any swelling or pain from.  No previous history of vascular intervention.  He does have risk factors of hypertension, hyperlipidemia and diabetes and a history of an MI.  Point-of-care ABIs were abnormal   Past Medical History:  Diagnosis Date   Anemia    GI bleed; 03/13/11 EGD: relux esophagitis with ulceration/clot   CAD (coronary artery disease)    Cardiomyopathy (HCC)    CHF (congestive heart failure) (HCC)    CKD (chronic kidney disease)    Diabetic foot ulcer (HCC)    Dyslipidemia    Dyspnea    08/05/21- not recently.   Dysrhythmia    PVCs   Heart failure (HCC)    Hypertension    Myocardial infarction (HCC)    Type 2 diabetes mellitus (HCC)    Family History  Problem Relation Age of Onset   Hypertension Mother    Heart failure Father    Hypertension Brother    Congestive Heart Failure Brother    Past Surgical History:  Procedure Laterality Date   AMPUTATION Left 04/30/2021   Procedure: LEFT FOOT 5TH RAY AMPUTATION APPLICATION OF A-CELL POWDER;  Surgeon: [name], MD;  [facility];  Service: Orthopedics;  Laterality: Left;   AMPUTATION Left 07/30/2021   Procedure: LEFT BELOW KNEE AMPUTATION;  Surgeon: [name], MD;  Location: [facility];  Service: Orthopedics;  Laterality: Left;   APPENDECTOMY     APPLICATION OF WOUND VAC Left 03/03/2021   Procedure: APPLICATION OF WOUND VAC;  Surgeon: Tarry Kos, MD;  [facility] OR;  Service: Orthopedics;  Laterality: Left;   APPLICATION OF WOUND VAC  03/07/2021   Procedure: APPLICATION OF WOUND VAC;   Surgeon: [name], MD;  Location: [facility] OR;  Service: Orthopedics;;   APPLICATION OF WOUND VAC  07/30/2021   Procedure: APPLICATION OF WOUND VAC;  Surgeon: [name], MD;  [facility];  Service: Orthopedics;;   CARDIAC CATHETERIZATION     COLON SURGERY     CORONARY ARTERY BYPASS GRAFT N/A 05/22/2021   Procedure: CORONARY ARTERY BYPASS GRAFTING (CABG) X 3 USING LEFT INTERNAL MAMMARY ARTERY AND RIGHT ENDOSCOPIC GREATER SAPHENOUS VEIN CONDUITS;  Surgeon: Corliss Skains, MD;  [facility];  Service: Open Heart Surgery;  Laterality: N/A;   ENDOVEIN HARVEST OF GREATER SAPHENOUS VEIN Right 05/22/2021   Procedure: ENDOVEIN HARVEST OF GREATER SAPHENOUS VEIN;  Surgeon: Corliss Skains, MD;  [facility];  Service: Open Heart Surgery;  Laterality: Right;   ESOPHAGOGASTRODUODENOSCOPY (EGD) WITH PROPOFOL N/A 03/12/2021   Procedure: ESOPHAGOGASTRODUODENOSCOPY (EGD) WITH PROPOFOL;  Surgeon: Jenel Lucks, MD;  Location: [facility] ENDOSCOPY;  Service: Gastroenterology;  Laterality: N/A;   I & D EXTREMITY Left 02/24/2021   Procedure: IRRIGATION AND DEBRIDEMENT EXTREMITY;  Surgeon: Myrene Galas, MD;  Location: [facility] OR;  Service: Orthopedics;  Laterality: Left;   I & D EXTREMITY Left 02/26/2021   Procedure: IRRIGATION AND DEBRIDEMENT OF LEG;  Surgeon: [name], MD;  Location: [facility] OR;  Service: Orthopedics;  Laterality:  Left;   I & D EXTREMITY Left 03/03/2021   Procedure: LEFT LEG IRRIGATION AND DEBRIDEMENT;  Surgeon: Tarry Kos, MD;  [facility];  Service: Orthopedics;  Laterality: Left;   I & D EXTREMITY Left 03/07/2021   Procedure: REPEAT DEBRIDEMENT LEFT LEG;  Surgeon: [name], MD;  Location: [facility] OR;  Service: Orthopedics;  Laterality: Left;   IR FLUORO GUIDE CV LINE RIGHT  03/10/2021   IR REMOVAL TUN CV CATH W/O FL  03/24/2021   IR US GUIDE VASC ACCESS RIGHT  03/10/2021   LEFT HEART CATH AND CORONARY ANGIOGRAPHY N/A 09/13/2020   Procedure: LEFT HEART CATH AND CORONARY  ANGIOGRAPHY;  Surgeon: Lennette Bihari, MD;  [facility] INVASIVE CV LAB;  Service: Cardiovascular;  Laterality: N/A;   LEFT HEART CATH AND CORONARY ANGIOGRAPHY N/A 03/28/2021   Procedure: LEFT HEART CATH AND CORONARY ANGIOGRAPHY;  Surgeon: Laurey Morale, MD;  Location: [facility] INVASIVE CV LAB;  Service: Cardiovascular;  Laterality: N/A;   RIGHT HEART CATH N/A 03/26/2021   Procedure: RIGHT HEART CATH;  Surgeon: Laurey Morale, MD;  Location: [facility] INVASIVE CV LAB;  Service: Cardiovascular;  Laterality: N/A;   STUMP REVISION Left 05/06/2022   Procedure: REVISION LEFT BELOW KNEE AMPUTATION;  Surgeon: [name], MD;  Location: [facility] OR;  Service: Orthopedics;  Laterality: Left;   TEE WITHOUT CARDIOVERSION N/A 05/22/2021   Procedure: TRANSESOPHAGEAL ECHOCARDIOGRAM (TEE);  Surgeon: Corliss Skains, MD;  Location: [facility] OR;  Service: Open Heart Surgery;  Laterality: N/A;   TOE AMPUTATION  2020    Short Social History:  Social History   Tobacco Use   Smoking status: Former    Packs/day: 3.00    Years: 36.00    Additional pack years: 0.00    Total pack years: 108.00    Types: Cigarettes    Quit date: 08/27/2020    Years since quitting: 2.3   Smokeless tobacco: Never  Substance Use Topics   Alcohol use: Not Currently    Comment: Very rare    Allergies  Allergen Reactions   Morphine Itching    Current Outpatient Medications  Medication Sig Dispense Refill   albuterol (VENTOLIN HFA) 108 (90 Base) MCG/ACT inhaler Inhale 2 puffs into the lungs every 4 (four) hours as needed for shortness of breath or wheezing.     amiodarone (PACERONE) 200 MG tablet Take 1 tablet (200 mg total) by mouth daily. 30 tablet 3   aspirin EC 81 MG tablet Take 1 tablet (81 mg total) by mouth daily. 30 tablet 3   Blood Pressure Monitoring (BLOOD PRESSURE CUFF) MISC 1 each by Does not apply route as directed. 1 each 0   carvedilol (COREG) 3.125 MG tablet Take 1 tablet (3.125 mg total) by mouth 2 (two) times daily. 60  tablet 3   digoxin (LANOXIN) 0.125 MG tablet Take 1 tablet (0.125 mg total) by mouth daily. 30 tablet 3   empagliflozin (JARDIANCE) 10 MG TABS tablet Take 1 tablet (10 mg total) by mouth daily. 30 tablet 3   eplerenone (INSPRA) 25 MG tablet Take 1 tablet (25 mg total) by mouth daily. 30 tablet 3   gabapentin (NEURONTIN) 300 MG capsule Take 1 capsule (300 mg total) by mouth at bedtime. 90 capsule 1   glipiZIDE (GLUCOTROL) 5 MG tablet Take 1 tablet (5 mg total) by mouth daily before breakfast. 90 tablet 2   glucose blood (TRUE METRIX BLOOD GLUCOSE TEST) test strip Use as instructed twice daily 100 each  12   rosuvastatin (CRESTOR) 20 MG tablet Take 1 tablet (20 mg total) by mouth daily. 90 tablet 1   sacubitril-valsartan (ENTRESTO) 24-26 MG Take 1 tablet by mouth 2 (two) times daily. 180 tablet 3   Semaglutide, 1 MG/DOSE, 4 MG/3ML SOPN Inject 1 mg as directed once a week. 3 mL 3   Semaglutide,0.25 or 0.5MG /DOS, (OZEMPIC, 0.25 OR 0.5 MG/DOSE,) 2 MG/3ML SOPN Inject 0.5 mg into the skin once a week. For 4 weeks then increase to 1mg  3 mL 0   TRUEplus Lancets 28G MISC Use as directed twice daily at 8 am and 10 pm. 100 each 11   venlafaxine XR (EFFEXOR-XR) 75 MG 24 hr capsule Take 1 capsule (75 mg total) by mouth daily with breakfast. 90 capsule 1   furosemide (LASIX) 40 MG tablet Take 1 tablet (40 mg total) by mouth as needed (take 0.5-1 tablet as needed for weight gain of 3 pounds over night or 5 pounds in one week or increased swelling in lower extremities.). 30 tablet 3   No current facility-administered medications for this visit.    Review of Systems  Constitutional:  Constitutional negative. HENT: HENT negative.  Eyes: Eyes negative.  Respiratory: Respiratory negative.  Cardiovascular: Cardiovascular negative.  GI: Gastrointestinal negative.  Musculoskeletal: Musculoskeletal negative.  Skin: Skin negative.  Neurological: Neurological negative. Hematologic: Hematologic/lymphatic negative.   Psychiatric: Psychiatric negative.        Objective:  Objective   Vitals:   12/16/22 1555  BP: 114/82  Pulse: 88  Resp: 20  Temp: 98 F (36.7 C)  SpO2: 96%  Weight: 260 lb (117.9 kg)  Height: 6\' 3"  (1.905 m)   Body mass index is 32.5 kg/m.  Physical Exam HENT:     Head: Normocephalic.     Nose: Nose normal.     Mouth/Throat:     Mouth: Mucous membranes are moist.  Eyes:     Pupils: Pupils are equal, round, and reactive to light.  Cardiovascular:     Pulses:          Dorsalis pedis pulses are 2+ on the right side.       Posterior tibial pulses are 2+ on the right side.  Pulmonary:     Effort: Pulmonary effort is normal.  Abdominal:     General: Abdomen is flat.  Musculoskeletal:     Right lower leg: No edema.     Comments: Left lower extremity prosthetic  Neurological:     General: No focal deficit present.     Mental Status: He is alert.     Data: ABI Findings:  +---------+------------------+-----+---------+--------+  Right   Rt Pressure (mmHg)IndexWaveform Comment   +---------+------------------+-----+---------+--------+  Brachial 122                                       +---------+------------------+-----+---------+--------+  PTA     116               0.95 triphasic          +---------+------------------+-----+---------+--------+  DP      124               1.02 triphasic          +---------+------------------+-----+---------+--------+  Great Toe107               0.88                    +---------+------------------+-----+---------+--------+   +--------+------------------+-----+--------+-------+  Left   Lt Pressure (mmHg)IndexWaveformComment  +--------+------------------+-----+--------+-------+  [id number]                                    +--------+------------------+-----+--------+-------+   +-------+-----------+-----------+------------+------------+  ABI/TBIToday's ABIToday's TBIPrevious  ABIPrevious TBI  +-------+-----------+-----------+------------+------------+  Right 1.02       0.88                                 +-------+-----------+-----------+------------+------------+  Left  BKA                                             +-------+-----------+-----------+------------+------------+        Results of POCT ABI are not available.    Summary:  Right: Resting right ankle-brachial index is within normal range. The  right toe-brachial index is normal.      Assessment/Plan:    52 year old male with history of left lower extremity amputation now walking with prosthetic had an outpatient point-of-care ABI that was abnormal but ABIs are normal with palpable pulses.  I recommended protecting his right foot continuing activity as tolerated and he can see me on an as-needed basis.     Maeola Harman MD [facility]

## 2022-12-25 ENCOUNTER — Telehealth: Payer: Self-pay

## 2022-12-25 DIAGNOSIS — I429 Cardiomyopathy, unspecified: Secondary | ICD-10-CM

## 2022-12-25 DIAGNOSIS — Z01812 Encounter for preprocedural laboratory examination: Secondary | ICD-10-CM

## 2022-12-25 DIAGNOSIS — I25119 Atherosclerotic heart disease of native coronary artery with unspecified angina pectoris: Secondary | ICD-10-CM

## 2022-12-25 NOTE — Telephone Encounter (Signed)
Spoke with pt and advised ICD implant has been scheduled for 02/24/2023 with arrival at [facility] at 630am.  Pt has an appointment with Dr Graciela Husbands scheduled for 01/13/2023.  Pt advised will review full instructions and provide surgical scrub at that appointment. Pt verbalizes understanding and agrees with current plan.

## 2022-12-25 NOTE — Telephone Encounter (Signed)
-----   Message from Duke Salvia, MD sent at 12/20/2022  3:54 PM EDT -----  Please Inform Patient Echo showed  stable but unfortunately persistently depressed heart muscle function   It would be reasonable to proceed at this point with ICD implant- glad to either schedule and see in hospital or see again in office   Thanks

## 2022-12-25 NOTE — Telephone Encounter (Signed)
Spoke with pt and advised of echo results per Dr Graciela Husbands as below.  Pt would like to proceed with ICD implant.  Pt advised will schedule procedure and call with instructions.  Pt verbalizes understanding and agrees with current plan.

## 2022-12-26 DIAGNOSIS — Z419 Encounter for procedure for purposes other than remedying health state, unspecified: Secondary | ICD-10-CM | POA: Diagnosis not present

## 2022-12-31 DIAGNOSIS — Z89512 Acquired absence of left leg below knee: Secondary | ICD-10-CM | POA: Diagnosis not present

## 2022-12-31 DIAGNOSIS — M25662 Stiffness of left knee, not elsewhere classified: Secondary | ICD-10-CM | POA: Diagnosis not present

## 2022-12-31 DIAGNOSIS — R262 Difficulty in walking, not elsewhere classified: Secondary | ICD-10-CM | POA: Diagnosis not present

## 2023-01-04 ENCOUNTER — Telehealth: Payer: Self-pay | Admitting: Family Medicine

## 2023-01-04 NOTE — Telephone Encounter (Signed)
Medication Refill - Medication: Pt is requesting a refill on all of his medications.  Has the patient contacted their pharmacy? Yes.   Pharmacy advised pt that he has no refills on all of his medications to reach out to his PCP.    Preferred Pharmacy (with phone number or street name):   [id number] - [place], [place] - [address] AT [facility] [address]  Phone: [phone] Fax: [phone]  Has the patient been seen for an appointment in the last year OR does the patient have an upcoming appointment? Yes.    Agent: Please be advised that RX refills may take up to 3 business days. We ask that you follow-up with your pharmacy.

## 2023-01-04 NOTE — Telephone Encounter (Signed)
Called and spoke with the patient who stated that he has 2 refills left on his medications prescribed by Dr. Alvis Lemmings and would need a refill when those run out. Advised the patient to continue calling the pharmacy for his his refills at this time and the pharmacy will notify the office when he needs a new prescription for his medications. Patient verbalized understanding.

## 2023-01-06 ENCOUNTER — Encounter: Payer: Self-pay | Admitting: Family Medicine

## 2023-01-08 ENCOUNTER — Other Ambulatory Visit: Payer: Self-pay

## 2023-01-11 ENCOUNTER — Inpatient Hospital Stay: Admission: RE | Admit: 2023-01-11 | Payer: Medicaid Other | Source: Ambulatory Visit

## 2023-01-13 ENCOUNTER — Ambulatory Visit: Payer: Medicaid Other | Attending: Internal Medicine | Admitting: Internal Medicine

## 2023-01-13 ENCOUNTER — Encounter: Payer: Self-pay | Admitting: Internal Medicine

## 2023-01-13 NOTE — Progress Notes (Deleted)
Patient Care Team: Hoy Register, MD as PCP - General (Family Medicine) Baldo Daub, MD as PCP - Cardiology (Cardiology) Duke Salvia, MD as PCP - Electrophysiology (Cardiology) Marcy Siren, LCSW as Social Worker (Licensed Clinical Social Worker)   HPI  Adam James is a 52 y.o. male seen in follow-up for consultation for an ICD for primary prevention in the setting of coronary artery disease congestive heart failure and bypass 10/22; there has been interval improvement when seen 3/24 he wanted to wait to see how it was that his LV function might further improve.  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   Dysrhythmia    PVCs   Heart failure (HCC)    Hypertension    Myocardial infarction (HCC)    Type 2 diabetes mellitus (HCC)     Past Surgical History:  Procedure Laterality Date   AMPUTATION Left 04/30/2021   Procedure: LEFT FOOT 5TH RAY AMPUTATION APPLICATION OF A-CELL POWDER;  Surgeon: [name], MD;  Location: [facility] OR;  Service: Orthopedics;  Laterality: Left;   AMPUTATION Left 07/30/2021   Procedure: LEFT BELOW KNEE AMPUTATION;  Surgeon: [name], MD;  Location: [facility] OR;  Service: Orthopedics;  Laterality: Left;   APPENDECTOMY     APPLICATION OF WOUND VAC Left 03/03/2021   Procedure: APPLICATION OF  WOUND VAC;  Surgeon: Tarry Kos, MD;  Location[facility] OR;  Service: Orthopedics;  Laterality: Left;   APPLICATION OF WOUND VAC  03/07/2021   Procedure: APPLICATION OF WOUND VAC;  Surgeon: [name], MD;  Location: [facility] OR;  Service: Orthopedics;;   APPLICATION OF WOUND VAC  07/30/2021   Procedure: APPLICATION OF WOUND VAC;  Surgeon: [name], MD;  [facility];  Service: Orthopedics;;   CARDIAC CATHETERIZATION     COLON SURGERY     CORONARY ARTERY BYPASS GRAFT N/A 05/22/2021   Procedure: CORONARY ARTERY BYPASS GRAFTING (CABG) X 3 USING LEFT INTERNAL MAMMARY ARTERY AND RIGHT ENDOSCOPIC GREATER SAPHENOUS VEIN CONDUITS;  Surgeon: Corliss Skains, MD;  Location[facility] OR;  Service: Open Heart Surgery;  Laterality: N/A;   ENDOVEIN HARVEST OF GREATER SAPHENOUS VEIN Right 05/22/2021   Procedure: ENDOVEIN HARVEST OF GREATER SAPHENOUS VEIN;  Surgeon: Corliss Skains, MD;  [facility];  Service: Open Heart Surgery;  Laterality: Right;   ESOPHAGOGASTRODUODENOSCOPY (EGD) WITH PROPOFOL N/A 03/12/2021   Procedure: ESOPHAGOGASTRODUODENOSCOPY (EGD) WITH PROPOFOL;  Surgeon: Jenel Lucks, MD;  Location: [facility] ENDOSCOPY;  Service: Gastroenterology;  Laterality: N/A;  I & D EXTREMITY Left 02/24/2021   Procedure: IRRIGATION AND DEBRIDEMENT EXTREMITY;  Surgeon: Myrene Galas, MD;  Location: [facility] OR;  Service: Orthopedics;  Laterality: Left;   I & D EXTREMITY Left 02/26/2021   Procedure: IRRIGATION AND DEBRIDEMENT OF LEG;  Surgeon: [name], MD;  Location: [facility] OR;  Service: Orthopedics;  Laterality: Left;   I & D EXTREMITY Left 03/03/2021   Procedure: LEFT LEG IRRIGATION AND DEBRIDEMENT;  Surgeon: Tarry Kos, MD;  [facility] OR;  Service: Orthopedics;  Laterality: Left;   I & D EXTREMITY Left 03/07/2021   Procedure: REPEAT DEBRIDEMENT LEFT LEG;  Surgeon: [name], MD;  Location: [facility] OR;  Service: Orthopedics;  Laterality: Left;   IR FLUORO GUIDE CV LINE RIGHT  03/10/2021   IR REMOVAL TUN  CV CATH W/O FL  03/24/2021   IR US GUIDE VASC ACCESS RIGHT  03/10/2021   LEFT HEART CATH AND CORONARY ANGIOGRAPHY N/A 09/13/2020   Procedure: LEFT HEART CATH AND CORONARY ANGIOGRAPHY;  Surgeon: Lennette Bihari, MD;  [facility] INVASIVE CV LAB;  Service: Cardiovascular;  Laterality: N/A;   LEFT HEART CATH AND CORONARY ANGIOGRAPHY N/A 03/28/2021   Procedure: LEFT HEART CATH AND CORONARY ANGIOGRAPHY;  Surgeon: Laurey Morale, MD;  Location: [facility] INVASIVE CV LAB;  Service: Cardiovascular;  Laterality: N/A;   RIGHT HEART CATH N/A 03/26/2021   Procedure: RIGHT HEART CATH;  Surgeon: Laurey Morale, MD;  Location: [facility] INVASIVE CV LAB;  Service: Cardiovascular;  Laterality: N/A;   STUMP REVISION Left 05/06/2022   Procedure: REVISION LEFT BELOW KNEE AMPUTATION;  Surgeon: [name], MD;  Location: [facility] OR;  Service: Orthopedics;  Laterality: Left;   TEE WITHOUT CARDIOVERSION N/A 05/22/2021   Procedure: TRANSESOPHAGEAL ECHOCARDIOGRAM (TEE);  Surgeon: Corliss Skains, MD;  Location: [facility] OR;  Service: Open Heart Surgery;  Laterality: N/A;   TOE AMPUTATION  2020    No outpatient medications have been marked as taking for the 01/13/23 encounter (Appointment) with Duke Salvia, MD.    Allergies  Allergen Reactions   Morphine Itching      Review of Systems negative except from HPI and PMH  Physical Exam There were no vitals taken for this visit. Well developed and well nourished in no acute distress HENT normal E scleral and icterus clear Neck Supple JVP flat; carotids brisk and full Clear to ausculation {CARD RHYTHM:10874} ***Regular rate and rhythm, no murmurs gallops or rub Soft with active bowel sounds No clubbing cyanosis {Numbers; edema:17696} Edema Alert and oriented, grossly normal motor and sensory function Skin Warm and Dry  ECG ***  CrCl cannot be calculated (Patient's most recent lab result is older than the maximum 21 days allowed.).   Assessment and   Plan       Current medicines are reviewed at length with the patient today .  The patient does not*** have concerns regarding medicines.

## 2023-01-14 ENCOUNTER — Other Ambulatory Visit (HOSPITAL_COMMUNITY): Payer: Self-pay | Admitting: Family Medicine

## 2023-01-14 ENCOUNTER — Other Ambulatory Visit: Payer: Self-pay | Admitting: Physician Assistant

## 2023-01-14 DIAGNOSIS — E1142 Type 2 diabetes mellitus with diabetic polyneuropathy: Secondary | ICD-10-CM

## 2023-01-14 DIAGNOSIS — E1159 Type 2 diabetes mellitus with other circulatory complications: Secondary | ICD-10-CM

## 2023-01-25 DIAGNOSIS — Z419 Encounter for procedure for purposes other than remedying health state, unspecified: Secondary | ICD-10-CM | POA: Diagnosis not present

## 2023-02-03 ENCOUNTER — Other Ambulatory Visit: Payer: Self-pay

## 2023-02-06 ENCOUNTER — Other Ambulatory Visit (HOSPITAL_COMMUNITY): Payer: Self-pay | Admitting: Family Medicine

## 2023-02-16 ENCOUNTER — Other Ambulatory Visit: Payer: Self-pay | Admitting: Family Medicine

## 2023-02-16 DIAGNOSIS — E1142 Type 2 diabetes mellitus with diabetic polyneuropathy: Secondary | ICD-10-CM

## 2023-02-16 DIAGNOSIS — E1159 Type 2 diabetes mellitus with other circulatory complications: Secondary | ICD-10-CM

## 2023-02-17 ENCOUNTER — Ambulatory Visit: Payer: Medicaid Other | Attending: Family Medicine

## 2023-02-23 NOTE — Pre-Procedure Instructions (Signed)
Instructed patient on the following items: Arrival time 0600 Nothing to eat or drink after midnight No meds AM of procedure Responsible person to drive you home and stay with you for 24 hrs Wash with special soap night before and morning of procedure

## 2023-02-24 ENCOUNTER — Ambulatory Visit (HOSPITAL_COMMUNITY): Payer: Medicaid Other

## 2023-02-24 ENCOUNTER — Other Ambulatory Visit: Payer: Self-pay

## 2023-02-24 ENCOUNTER — Ambulatory Visit (HOSPITAL_COMMUNITY)
Admission: RE | Admit: 2023-02-24 | Discharge: 2023-02-24 | Disposition: A | Discharge status: Home / Self Care | Payer: Medicaid Other | Attending: Internal Medicine | Admitting: Internal Medicine

## 2023-02-24 ENCOUNTER — Encounter (HOSPITAL_COMMUNITY)
Admission: RE | Disposition: A | Discharge status: Home / Self Care | Payer: Self-pay | Source: Home / Self Care | Attending: Internal Medicine

## 2023-02-24 DIAGNOSIS — E1122 Type 2 diabetes mellitus with diabetic chronic kidney disease: Secondary | ICD-10-CM | POA: Insufficient documentation

## 2023-02-24 DIAGNOSIS — I509 Heart failure, unspecified: Secondary | ICD-10-CM | POA: Insufficient documentation

## 2023-02-24 DIAGNOSIS — Z79899 Other long term (current) drug therapy: Secondary | ICD-10-CM | POA: Insufficient documentation

## 2023-02-24 DIAGNOSIS — I255 Ischemic cardiomyopathy: Secondary | ICD-10-CM | POA: Diagnosis not present

## 2023-02-24 DIAGNOSIS — I493 Ventricular premature depolarization: Secondary | ICD-10-CM | POA: Insufficient documentation

## 2023-02-24 DIAGNOSIS — I130 Hypertensive heart and chronic kidney disease with heart failure and stage 1 through stage 4 chronic kidney disease, or unspecified chronic kidney disease: Secondary | ICD-10-CM | POA: Diagnosis not present

## 2023-02-24 DIAGNOSIS — Z89512 Acquired absence of left leg below knee: Secondary | ICD-10-CM | POA: Insufficient documentation

## 2023-02-24 DIAGNOSIS — I5022 Chronic systolic (congestive) heart failure: Secondary | ICD-10-CM

## 2023-02-24 DIAGNOSIS — Z951 Presence of aortocoronary bypass graft: Secondary | ICD-10-CM | POA: Diagnosis not present

## 2023-02-24 DIAGNOSIS — N189 Chronic kidney disease, unspecified: Secondary | ICD-10-CM | POA: Insufficient documentation

## 2023-02-24 DIAGNOSIS — Z87891 Personal history of nicotine dependence: Secondary | ICD-10-CM | POA: Diagnosis not present

## 2023-02-24 DIAGNOSIS — Z8249 Family history of ischemic heart disease and other diseases of the circulatory system: Secondary | ICD-10-CM | POA: Insufficient documentation

## 2023-02-24 DIAGNOSIS — Z95810 Presence of automatic (implantable) cardiac defibrillator: Secondary | ICD-10-CM | POA: Diagnosis not present

## 2023-02-24 HISTORY — PX: ICD IMPLANT: EP1208

## 2023-02-24 LAB — CBC
HCT: 44.8 % (ref 39.0–52.0)
Hemoglobin: 14.9 g/dL (ref 13.0–17.0)
MCH: 29.5 pg (ref 26.0–34.0)
MCHC: 33.3 g/dL (ref 30.0–36.0)
MCV: 88.7 fL (ref 80.0–100.0)
Platelets: 224 10*3/uL (ref 150–400)
RBC: 5.05 MIL/uL (ref 4.22–5.81)
RDW: 13.2 % (ref 11.5–15.5)
WBC: 9.2 10*3/uL (ref 4.0–10.5)
nRBC: 0 % (ref 0.0–0.2)

## 2023-02-24 LAB — BASIC METABOLIC PANEL
Anion gap: 10 (ref 5–15)
BUN: 13 mg/dL (ref 6–20)
CO2: 21 mmol/L — ABNORMAL LOW (ref 22–32)
Calcium: 8.8 mg/dL — ABNORMAL LOW (ref 8.9–10.3)
Chloride: 107 mmol/L (ref 98–111)
Creatinine, Ser: 1.66 mg/dL — ABNORMAL HIGH (ref 0.61–1.24)
GFR, Estimated: 50 mL/min — ABNORMAL LOW (ref >60)
Glucose, Bld: 224 mg/dL — ABNORMAL HIGH (ref 70–99)
Potassium: 3.9 mmol/L (ref 3.5–5.1)
Sodium: 138 mmol/L (ref 135–145)

## 2023-02-24 LAB — GLUCOSE, CAPILLARY
Glucose-Capillary: 198 mg/dL — ABNORMAL HIGH (ref 70–99)
Glucose-Capillary: 184 mg/dL — ABNORMAL HIGH (ref 70–99)

## 2023-02-24 SURGERY — ICD IMPLANT

## 2023-02-24 MED ORDER — SODIUM CHLORIDE 0.9 % IV SOLN: INTRAVENOUS | Status: DC

## 2023-02-24 MED ORDER — LIDOCAINE HCL (PF) 1 % IJ SOLN
INTRAMUSCULAR | Status: DC | PRN
  Administered 2023-02-24: 80 mL

## 2023-02-24 MED ORDER — CHLORHEXIDINE GLUCONATE 4 % EX SOLN
4.0000 | Freq: Once | CUTANEOUS | Status: DC
  Filled 2023-02-24: qty 60

## 2023-02-24 MED ORDER — MIDAZOLAM HCL 2 MG/2ML IJ SOLN
INTRAMUSCULAR | Status: AC
  Filled 2023-02-24: qty 2

## 2023-02-24 MED ORDER — ACETAMINOPHEN 325 MG PO TABS: 325.0000 mg | ORAL_TABLET | ORAL | Status: DC | PRN

## 2023-02-24 MED ORDER — FENTANYL CITRATE (PF) 100 MCG/2ML IJ SOLN
INTRAMUSCULAR | Status: AC
  Filled 2023-02-24: qty 2

## 2023-02-24 MED ORDER — LIDOCAINE HCL (PF) 1 % IJ SOLN
INTRAMUSCULAR | Status: AC
  Filled 2023-02-24: qty 30

## 2023-02-24 MED ORDER — HEPARIN (PORCINE) IN NACL 1000-0.9 UT/500ML-% IV SOLN
INTRAVENOUS | Status: DC | PRN
  Administered 2023-02-24: 500 mL

## 2023-02-24 MED ORDER — CEFAZOLIN SODIUM-DEXTROSE 2-3 GM-%(50ML) IV SOLR
INTRAVENOUS | Status: DC | PRN
  Administered 2023-02-24: 2 g via INTRAVENOUS

## 2023-02-24 MED ORDER — MIDAZOLAM HCL 5 MG/5ML IJ SOLN
INTRAMUSCULAR | Status: DC | PRN
  Administered 2023-02-24: 1 mg via INTRAVENOUS
  Administered 2023-02-24: 2 mg via INTRAVENOUS
  Administered 2023-02-24 (×2): 1 mg via INTRAVENOUS

## 2023-02-24 MED ORDER — ONDANSETRON HCL 4 MG/2ML IJ SOLN: 4.0000 mg | Freq: Four times a day (QID) | INTRAMUSCULAR | Status: DC | PRN

## 2023-02-24 MED ORDER — SODIUM CHLORIDE 0.9 % IV SOLN
INTRAVENOUS | Status: AC
  Filled 2023-02-24: qty 2

## 2023-02-24 MED ORDER — FENTANYL CITRATE (PF) 100 MCG/2ML IJ SOLN
INTRAMUSCULAR | Status: DC | PRN
  Administered 2023-02-24 (×4): 25 ug via INTRAVENOUS

## 2023-02-24 MED ORDER — CEFAZOLIN SODIUM-DEXTROSE 2-4 GM/100ML-% IV SOLN
INTRAVENOUS | Status: AC
  Filled 2023-02-24: qty 100

## 2023-02-24 MED ORDER — CEFAZOLIN SODIUM-DEXTROSE 2-4 GM/100ML-% IV SOLN: 2.0000 g | INTRAVENOUS | Status: DC

## 2023-02-24 MED ORDER — SODIUM CHLORIDE 0.9 % IV SOLN
80.0000 mg | INTRAVENOUS | Status: AC
  Administered 2023-02-24: 80 mg

## 2023-02-24 SURGICAL SUPPLY — 6 items
CABLE SURGICAL S-101-97-12 (CABLE) ×1 IMPLANT
ICD GALLANT VR DF1 CDVRA500T (ICD Generator) IMPLANT
LEAD DURATA 7122-65CM (Lead) IMPLANT
PAD DEFIB RADIO PHYSIO CONN (PAD) ×1 IMPLANT
SHEATH 7FR PRELUDE SNAP 13 (SHEATH) IMPLANT
TRAY PACEMAKER INSERTION (PACKS) ×1 IMPLANT

## 2023-02-24 NOTE — H&P (Signed)
Patient Care Team: Hoy Register, MD as PCP - General (Family Medicine) Baldo Daub, MD as PCP - Cardiology (Cardiology) Duke Salvia, MD as PCP - Electrophysiology (Cardiology) Marcy Siren, LCSW as Social Worker (Licensed Clinical Social Worker)   HPI  Adam James is a 51 y.o. male admitted for ICD for primary prevention in the setting of ICM CABG 10/22; takes amio for PVCs   No dyspnea, chest pain no edema   DATE TEST EF    4/22 cMRI 40%    8/22 Echo  25%    4/23 Echo  25%    10/23 Echo  30%    5/24 Echo  25-30%     Date Cr K Hgb Dig  5/23     13.2    2/24 1.83 5.1   0.3   5/24 1.81 4.3  15.2       Records and Results Reviewed   Past Medical History:  Diagnosis Date   Anemia    GI bleed; 03/13/11 EGD: relux esophagitis with ulceration/clot   CAD (coronary artery disease)    Cardiomyopathy (HCC)    CHF (congestive heart failure) (HCC)    CKD (chronic kidney disease)    Diabetic foot ulcer (HCC)    Dyslipidemia    Dyspnea    08/05/21- not recently.   Dysrhythmia    PVCs   Heart failure (HCC)    Hypertension    Myocardial infarction (HCC)    Type 2 diabetes mellitus (HCC)     Past Surgical History:  Procedure Laterality Date   AMPUTATION Left 04/30/2021   Procedure: LEFT FOOT 5TH RAY AMPUTATION APPLICATION OF A-CELL POWDER;  Surgeon: [name], MD;  Location: [facility] OR;  Service: Orthopedics;  Laterality: Left;   AMPUTATION Left 07/30/2021   Procedure: LEFT BELOW KNEE AMPUTATION;  Surgeon: [name], MD;  Location: [facility] OR;  Service: Orthopedics;  Laterality: Left;   APPENDECTOMY     APPLICATION OF WOUND VAC Left 03/03/2021   Procedure: APPLICATION OF WOUND VAC;  Surgeon: Tarry Kos, MD;  Location: [facility] OR;  Service: Orthopedics;  Laterality: Left;   APPLICATION OF WOUND VAC  03/07/2021   Procedure: APPLICATION OF WOUND VAC;  Surgeon: [name], MD;  Location: [facility] OR;  Service: Orthopedics;;   APPLICATION OF WOUND VAC   07/30/2021   Procedure: APPLICATION OF WOUND VAC;  Surgeon: [name], MD;  Location: [facility] OR;  Service: Orthopedics;;   CARDIAC CATHETERIZATION     COLON SURGERY     CORONARY ARTERY BYPASS GRAFT N/A 05/22/2021   Procedure: CORONARY ARTERY BYPASS GRAFTING (CABG) X 3 USING LEFT INTERNAL MAMMARY ARTERY AND RIGHT ENDOSCOPIC GREATER SAPHENOUS VEIN CONDUITS;  Surgeon: Corliss Skains, MD;  Location: [facility] OR;  Service: Open Heart Surgery;  Laterality: N/A;   ENDOVEIN HARVEST OF GREATER SAPHENOUS VEIN Right 05/22/2021   Procedure: ENDOVEIN HARVEST OF GREATER SAPHENOUS VEIN;  Surgeon: Corliss Skains, MD;  Location: [facility] OR;  Service: Open Heart Surgery;  Laterality: Right;   ESOPHAGOGASTRODUODENOSCOPY (EGD) WITH PROPOFOL N/A 03/12/2021   Procedure: ESOPHAGOGASTRODUODENOSCOPY (EGD) WITH PROPOFOL;  Surgeon: Jenel Lucks, MD;  Location: [facility] ENDOSCOPY;  Service: Gastroenterology;  Laterality: N/A;   I & D EXTREMITY Left 02/24/2021   Procedure: IRRIGATION AND DEBRIDEMENT EXTREMITY;  Surgeon: Myrene Galas, MD;  Location: [facility] OR;  Service: Orthopedics;  Laterality: Left;   I & D EXTREMITY Left 02/26/2021   Procedure: IRRIGATION AND DEBRIDEMENT OF LEG;  Surgeon: [name], MD;  Location: [facility] OR;  Service: Orthopedics;  Laterality: Left;   I & D EXTREMITY Left 03/03/2021   Procedure: LEFT LEG IRRIGATION AND DEBRIDEMENT;  Surgeon: Tarry Kos, MD;  Location: [facility] OR;  Service: Orthopedics;  Laterality: Left;   I & D EXTREMITY Left 03/07/2021   Procedure: REPEAT DEBRIDEMENT LEFT LEG;  Surgeon: [name], MD;  Location: [facility] OR;  Service: Orthopedics;  Laterality: Left;   IR FLUORO GUIDE CV LINE RIGHT  03/10/2021   IR REMOVAL TUN CV CATH W/O FL  03/24/2021   IR US GUIDE VASC ACCESS RIGHT  03/10/2021   LEFT HEART CATH AND CORONARY ANGIOGRAPHY N/A 09/13/2020   Procedure: LEFT HEART CATH AND CORONARY ANGIOGRAPHY;  Surgeon: Lennette Bihari, MD;  Location: [facility] INVASIVE CV LAB;  Service: Cardiovascular;   Laterality: N/A;   LEFT HEART CATH AND CORONARY ANGIOGRAPHY N/A 03/28/2021   Procedure: LEFT HEART CATH AND CORONARY ANGIOGRAPHY;  Surgeon: Laurey Morale, MD;  Location: [facility] INVASIVE CV LAB;  Service: Cardiovascular;  Laterality: N/A;   RIGHT HEART CATH N/A 03/26/2021   Procedure: RIGHT HEART CATH;  Surgeon: Laurey Morale, MD;  Location: [facility] INVASIVE CV LAB;  Service: Cardiovascular;  Laterality: N/A;   STUMP REVISION Left 05/06/2022   Procedure: REVISION LEFT BELOW KNEE AMPUTATION;  Surgeon: [name], MD;  Location: [facility] OR;  Service: Orthopedics;  Laterality: Left;   TEE WITHOUT CARDIOVERSION N/A 05/22/2021   Procedure: TRANSESOPHAGEAL ECHOCARDIOGRAM (TEE);  Surgeon: Corliss Skains, MD;  Location: [facility] OR;  Service: Open Heart Surgery;  Laterality: N/A;   TOE AMPUTATION  2020    Current Facility-Administered Medications  Medication Dose Route Frequency Provider Last Rate Last Admin   0.9 %  sodium chloride infusion   Intravenous Continuous Duke Salvia, MD       0.9 %  sodium chloride infusion   Intravenous Continuous Duke Salvia, MD       ceFAZolin (ANCEF) IVPB 2g/100 mL premix  2 g Intravenous On Call Duke Salvia, MD       chlorhexidine (HIBICLENS) 4 % liquid 4 Application  4 Application Topical Once Duke Salvia, MD       gentamicin (GARAMYCIN) 80 mg in sodium chloride 0.9 % 500 mL irrigation  80 mg Irrigation On Call Duke Salvia, MD        Allergies  Allergen Reactions   Morphine Itching    Itching under skin      Social History   Tobacco Use   Smoking status: Former    Current packs/day: 0.00    Average packs/day: 3.0 packs/day for 36.0 years (108.0 ttl pk-yrs)    Types: Cigarettes    Start date: 08/27/1984    Quit date: 08/27/2020    Years since quitting: 2.4   Smokeless tobacco: Never  Vaping Use   Vaping status: Never Used  Substance Use Topics   Alcohol use: Not Currently    Comment: Very rare   Drug use: Not Currently    Types: Marijuana      Family History  Problem Relation Age of Onset   Hypertension Mother    Heart failure Father    Hypertension Brother    Congestive Heart Failure Brother      Current Meds  Medication Sig   amiodarone (PACERONE) 200 MG tablet Take 1 tablet (200 mg total) by mouth daily.   aspirin EC 81 MG tablet Take 1 tablet (81 mg  total) by mouth daily.   atorvastatin (LIPITOR) 80 MG tablet Take 80 mg by mouth daily.   carvedilol (COREG) 3.125 MG tablet Take 1 tablet (3.125 mg total) by mouth 2 (two) times daily.   digoxin (LANOXIN) 0.125 MG tablet TAKE 1 TABLET(0.125 MG) BY MOUTH DAILY   empagliflozin (JARDIANCE) 10 MG TABS tablet Take 1 tablet (10 mg total) by mouth daily. Please make appointment with Dr. Alvis Lemmings for more refills.   eplerenone (INSPRA) 25 MG tablet TAKE 1 TABLET(25 MG) BY MOUTH DAILY   furosemide (LASIX) 40 MG tablet Take 1 tablet (40 mg total) by mouth as needed (take 0.5-1 tablet as needed for weight gain of 3 pounds over night or 5 pounds in one week or increased swelling in lower extremities.). (Patient taking differently: Take 40 mg by mouth as needed for fluid or edema (take 0.5-1 tablet as needed for weight gain of 3 pounds over night or 5 pounds in one week or increased swelling in lower extremities.).)   gabapentin (NEURONTIN) 300 MG capsule Take 1 capsule (300 mg total) by mouth at bedtime.   glipiZIDE (GLUCOTROL) 5 MG tablet Take 1 tablet (5 mg total) by mouth daily before breakfast.   rosuvastatin (CRESTOR) 20 MG tablet Take 1 tablet (20 mg total) by mouth daily.   Semaglutide, 1 MG/DOSE, 4 MG/3ML SOPN Inject 1 mg as directed once a week.   Semaglutide,0.25 or 0.5MG /DOS, (OZEMPIC, 0.25 OR 0.5 MG/DOSE,) 2 MG/3ML SOPN Inject 0.5 mg into the skin once a week. For 4 weeks then increase to 1mg    venlafaxine XR (EFFEXOR-XR) 75 MG 24 hr capsule Take 1 capsule (75 mg total) by mouth daily with breakfast.     Review of Systems negative except from HPI and PMH  Physical  Exam BP 113/83   Pulse 67   Temp 98.6 F (37 C) (Temporal)   Resp 20   Ht 6\' 3"  (1.905 m)   Wt 99.8 kg   SpO2 99%   BMI 27.50 kg/m  Well developed and well nourished in no acute distress HENT normal E scleral and icterus clear Neck Supple JVP flat; carotids brisk and full Clear to ausculation Regular rate and rhythm, no murmurs gallops or rub Soft with active bowel sounds No clubbing cyanosis  Edema  STUMP  on L  Alert and oriented, grossly normal motor and sensory function Skin Warm and Dry    Assessment and  Plan Ischemic cardiomyopathy s/p CABG   Congestive heart failure class *2   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  The patient express understanding  and is  willing to proceed.

## 2023-02-24 NOTE — Discharge Instructions (Addendum)
After Your ICD (Implantable Cardiac Defibrillator)   You have a [employer] ICD  ACTIVITY Do not lift your arm above shoulder height for 1 week after your procedure. After 7 days, you may progress as below.  You should remove your sling 24 hours after your procedure, unless otherwise instructed by your provider.     Wednesday March 03, 2023  Thursday March 04, 2023 Friday March 05, 2023 Saturday March 06, 2023   Do not lift, push, pull, or carry anything over 10 pounds with the affected arm until 6 weeks (Wednesday April 07, 2023 ) after your procedure.   You may drive AFTER your wound check, unless you have been told otherwise by your provider.   Ask your healthcare provider when you can go back to work   INCISION/Dressing If you are on a blood thinner such as Coumadin, Xarelto, Eliquis, Plavix, or Pradaxa please confirm with your provider when this should be resumed.   If large square, outer bandage is left in place, this can be removed after 24 hours from your procedure. Do not remove steri-strips or glue as below.   Monitor your defibrillator site for redness, swelling, and drainage. Call the device clinic at [phone] if you experience these symptoms or fever/chills.  If your incision is closed with Dermabond/Surgical glue. You may shower 1 day after your pacemaker implant and wash around the site with soap and water.    If you were discharged in a sling, please do not wear this during the day more than 48 hours after your surgery unless otherwise instructed. This may increase the risk of stiffness and soreness in your shoulder.   Avoid lotions, ointments, or perfumes over your incision until it is well-healed.  You may use a hot tub or a pool AFTER your wound check appointment if the incision is completely closed.  Your ICD is designed to protect you from life threatening heart rhythms. Because of this, you may receive a shock.   1 shock with no symptoms:  Call the  office during business hours. 1 shock with symptoms (chest pain, chest pressure, dizziness, lightheadedness, shortness of breath, overall feeling unwell):  Call 911. If you experience 2 or more shocks in 24 hours:  Call 911. If you receive a shock, you should not drive for 6 months per the  DMV IF you receive appropriate therapy from your ICD.   ICD Alerts:  Some alerts are vibratory and others beep. These are NOT emergencies. Please call our office to let us know. If this occurs at night or on weekends, it can wait until the next business day. Send a remote transmission.  If your device is capable of reading fluid status (for heart failure), you will be offered monthly monitoring to review this with you.   DEVICE MANAGEMENT Remote monitoring is used to monitor your ICD from home. This monitoring is scheduled every 91 days by our office. It allows [place] to keep an eye on the functioning of your device to ensure it is working properly. You will routinely see your Electrophysiologist annually (more often if necessary).   You should receive your ID card for your new device in 4-8 weeks. Keep this card with you at all times once received. Consider wearing a medical alert bracelet or necklace.  Your ICD  may be MRI compatible. This will be discussed at your next office visit/wound check.  You should avoid contact with strong electric or magnetic fields.   Do not use amateur (ham)  radio equipment or electric (arc) Optician, dispensing. MP3 player headphones with magnets should not be used. Some devices are safe to use if held at least 12 inches (30 cm) from your defibrillator. These include power tools, lawn mowers, and speakers. If you are unsure if something is safe to use, ask your health care provider.  When using your cell phone, hold it to the ear that is on the opposite side from the defibrillator. Do not leave your cell phone in a pocket over the defibrillator.  You may safely use electric blankets,  heating pads, computers, and microwave ovens.  Call the office right away if: You have chest pain. You feel more than one shock. You feel more short of breath than you have felt before. You feel more light-headed than you have felt before. Your incision starts to open up.  This information is not intended to replace advice given to you by your health care provider. Make sure you discuss any questions you have with your health care provider.

## 2023-02-25 ENCOUNTER — Encounter (HOSPITAL_COMMUNITY): Payer: Self-pay | Admitting: Internal Medicine

## 2023-02-25 DIAGNOSIS — Z419 Encounter for procedure for purposes other than remedying health state, unspecified: Secondary | ICD-10-CM | POA: Diagnosis not present

## 2023-03-04 ENCOUNTER — Other Ambulatory Visit (HOSPITAL_COMMUNITY): Payer: Self-pay | Admitting: Family Medicine

## 2023-03-10 ENCOUNTER — Ambulatory Visit: Payer: Medicaid Other | Attending: Internal Medicine

## 2023-03-10 DIAGNOSIS — I5023 Acute on chronic systolic (congestive) heart failure: Secondary | ICD-10-CM

## 2023-03-10 DIAGNOSIS — K219 Gastro-esophageal reflux disease without esophagitis: Secondary | ICD-10-CM | POA: Diagnosis not present

## 2023-03-10 DIAGNOSIS — Z87891 Personal history of nicotine dependence: Secondary | ICD-10-CM | POA: Diagnosis not present

## 2023-03-10 DIAGNOSIS — E119 Type 2 diabetes mellitus without complications: Secondary | ICD-10-CM | POA: Diagnosis not present

## 2023-03-10 DIAGNOSIS — M810 Age-related osteoporosis without current pathological fracture: Secondary | ICD-10-CM | POA: Diagnosis not present

## 2023-03-10 DIAGNOSIS — S42031A Displaced fracture of lateral end of right clavicle, initial encounter for closed fracture: Secondary | ICD-10-CM | POA: Diagnosis not present

## 2023-03-10 DIAGNOSIS — Z885 Allergy status to narcotic agent status: Secondary | ICD-10-CM | POA: Diagnosis not present

## 2023-03-10 DIAGNOSIS — Z89512 Acquired absence of left leg below knee: Secondary | ICD-10-CM | POA: Diagnosis not present

## 2023-03-10 DIAGNOSIS — S8001XA Contusion of right knee, initial encounter: Secondary | ICD-10-CM | POA: Diagnosis not present

## 2023-03-10 DIAGNOSIS — E7800 Pure hypercholesterolemia, unspecified: Secondary | ICD-10-CM | POA: Diagnosis not present

## 2023-03-10 DIAGNOSIS — I2510 Atherosclerotic heart disease of native coronary artery without angina pectoris: Secondary | ICD-10-CM | POA: Diagnosis not present

## 2023-03-10 DIAGNOSIS — Z043 Encounter for examination and observation following other accident: Secondary | ICD-10-CM | POA: Diagnosis not present

## 2023-03-10 DIAGNOSIS — Z7984 Long term (current) use of oral hypoglycemic drugs: Secondary | ICD-10-CM | POA: Diagnosis not present

## 2023-03-10 DIAGNOSIS — Z7982 Long term (current) use of aspirin: Secondary | ICD-10-CM | POA: Diagnosis not present

## 2023-03-10 DIAGNOSIS — Z79899 Other long term (current) drug therapy: Secondary | ICD-10-CM | POA: Diagnosis not present

## 2023-03-10 LAB — CUP PACEART INCLINIC DEVICE CHECK
Battery Remaining Longevity: 122 mo
Brady Statistic RV Percent Paced: 0 %
Date Time Interrogation Session: 20240814112604
HighPow Impedance: 67.5 Ohm
Implantable Lead Connection Status: 753985
Implantable Lead Implant Date: 20240731
Implantable Lead Location: 753860
Implantable Pulse Generator Implant Date: 20240731
Lead Channel Impedance Value: 637.5 Ohm
Lead Channel Pacing Threshold Amplitude: 0.5 V
Lead Channel Pacing Threshold Amplitude: 0.5 V
Lead Channel Pacing Threshold Pulse Width: 0.5 ms
Lead Channel Pacing Threshold Pulse Width: 0.5 ms
Lead Channel Sensing Intrinsic Amplitude: 12 mV
Lead Channel Setting Pacing Amplitude: 3.5 V
Lead Channel Setting Pacing Pulse Width: 0.5 ms
Lead Channel Setting Sensing Sensitivity: 0.5 mV
Pulse Gen Serial Number: 111074047
Zone Setting Status: 755011

## 2023-03-10 MED ORDER — CEPHALEXIN 500 MG PO CAPS
500.0000 mg | ORAL_CAPSULE | Freq: Two times a day (BID) | ORAL | 0 refills | Status: DC
Start: 2023-03-10 — End: 2023-03-23

## 2023-03-10 NOTE — Progress Notes (Signed)
Wound check appointment. Dermabond removed. Wound appears redness around edges. No drainage, swelling or warmth noted to site. Dr. Ladona Ridgel in to review, verbal orders obtained for Keflex 500mg  BID X5 days. Script sent to pharmacy. Wound recheck scheduled in 7 days.  Incision edges approximated. Normal device function. Thresholds, sensing, and impedances consistent with implant measurements. Device programmed at 3.5V for extra safety margin until 3 month visit. Histogram distribution appropriate for patient and level of activity. No mode switches or ventricular arrhythmias noted. Patient educated about wound care, arm mobility, lifting restrictions, shock plan. ROV in 3 months with implanting physician.  Patient fell while in parking lot d/t tripping on shoe. Has left lower leg prothesis. Complains of right shoulder/left knee pain. Patient has no family present and per Dr. Ladona Ridgel patient instructed to go to ER for evaluation. Patient states he is able to drive to be seen. Tresa Endo, RN made aware and we have arrangements made to take patient to car via wheelchair.

## 2023-03-10 NOTE — Patient Instructions (Signed)
   After Your ICD (Implantable Cardiac Defibrillator)    Monitor your defibrillator site for increased redness, swelling, and drainage. Call the device clinic at [phone] if you experience these symptoms or fever/chills.  Your incision was closed with Dermabond:  You may shower 1 day after your defibrillator implant and wash your incision with soap and water. Avoid lotions, ointments, or perfumes over your incision until it is well-healed.  You may use a hot tub or a pool after your wound check appointment if the incision is completely closed.  Do not lift, push or pull greater than 10 pounds with the affected arm until 6 weeks after your procedure. There are no other restrictions in arm movement after your wound check appointment.  Your ICD is designed to protect you from life threatening heart rhythms. Because of this, you may receive a shock.   1 shock with no symptoms:  Call the office during business hours. 1 shock with symptoms (chest pain, chest pressure, dizziness, lightheadedness, shortness of breath, overall feeling unwell):  Call 911. If you experience 2 or more shocks in 24 hours:  Call 911. If you receive a shock, you should not drive.  [place] DMV - no driving for 6 months if you receive appropriate therapy from your ICD.   ICD Alerts:  Some alerts are vibratory and others beep. These are NOT emergencies. Please call our office to let us know. If this occurs at night or on weekends, it can wait until the next business day. Send a remote transmission.  If your device is capable of reading fluid status (for heart failure), you will be offered monthly monitoring to review this with you.   Remote monitoring is used to monitor your ICD from home. This monitoring is scheduled every 91 days by our office. It allows [place] to keep an eye on the functioning of your device to ensure it is working properly. You will routinely see your Electrophysiologist annually (more often if necessary).

## 2023-03-15 ENCOUNTER — Other Ambulatory Visit: Payer: Self-pay | Admitting: Physician Assistant

## 2023-03-15 DIAGNOSIS — F3289 Other specified depressive episodes: Secondary | ICD-10-CM

## 2023-03-15 DIAGNOSIS — R52 Pain, unspecified: Secondary | ICD-10-CM

## 2023-03-15 DIAGNOSIS — E1142 Type 2 diabetes mellitus with diabetic polyneuropathy: Secondary | ICD-10-CM

## 2023-03-16 ENCOUNTER — Ambulatory Visit (INDEPENDENT_AMBULATORY_CARE_PROVIDER_SITE_OTHER): Payer: Medicaid Other | Admitting: Orthopedic Surgery

## 2023-03-16 ENCOUNTER — Encounter: Payer: Self-pay | Admitting: Orthopedic Surgery

## 2023-03-16 DIAGNOSIS — S42031A Displaced fracture of lateral end of right clavicle, initial encounter for closed fracture: Secondary | ICD-10-CM

## 2023-03-16 MED ORDER — OXYCODONE HCL 5 MG PO TABS: 5.0000 mg | ORAL_TABLET | ORAL | 0 refills | Status: DC | PRN

## 2023-03-17 ENCOUNTER — Ambulatory Visit: Payer: Medicaid Other | Attending: Internal Medicine

## 2023-03-17 DIAGNOSIS — I5022 Chronic systolic (congestive) heart failure: Secondary | ICD-10-CM

## 2023-03-17 NOTE — Progress Notes (Signed)
Pt seen in device clinic for follow up wound check post antibiotics.  Wound site assessed with Otilio Saber PA.  No swelling or redness noted.  No drainage.    There is a dry scab noted medially at the incision site.  Pt will continue to wash site well and continue to monitor.  He is advised to call if he notices any drainage, redness, swelling or fevers.  Follow up as scheduled.

## 2023-03-17 NOTE — Patient Instructions (Signed)
Follow up as scheduled.  

## 2023-03-18 ENCOUNTER — Other Ambulatory Visit (HOSPITAL_COMMUNITY): Payer: Self-pay | Admitting: Family Medicine

## 2023-03-19 ENCOUNTER — Inpatient Hospital Stay (HOSPITAL_COMMUNITY): Payer: Medicaid Other

## 2023-03-19 ENCOUNTER — Emergency Department (HOSPITAL_COMMUNITY): Payer: Medicaid Other

## 2023-03-19 ENCOUNTER — Other Ambulatory Visit: Payer: Self-pay

## 2023-03-19 ENCOUNTER — Inpatient Hospital Stay (HOSPITAL_COMMUNITY)
Admission: EM | Admit: 2023-03-19 | Discharge: 2023-03-23 | DRG: 522 | Disposition: A | Discharge status: Home Health | Payer: Medicaid Other | Attending: Internal Medicine | Admitting: Internal Medicine

## 2023-03-19 ENCOUNTER — Encounter (HOSPITAL_COMMUNITY): Payer: Self-pay

## 2023-03-19 DIAGNOSIS — R011 Cardiac murmur, unspecified: Secondary | ICD-10-CM | POA: Diagnosis present

## 2023-03-19 DIAGNOSIS — S72041A Displaced fracture of base of neck of right femur, initial encounter for closed fracture: Secondary | ICD-10-CM

## 2023-03-19 DIAGNOSIS — Z89512 Acquired absence of left leg below knee: Secondary | ICD-10-CM

## 2023-03-19 DIAGNOSIS — Z9714 Presence of artificial left leg (complete) (partial): Secondary | ICD-10-CM | POA: Diagnosis not present

## 2023-03-19 DIAGNOSIS — Z01810 Encounter for preprocedural cardiovascular examination: Secondary | ICD-10-CM | POA: Diagnosis not present

## 2023-03-19 DIAGNOSIS — N1831 Chronic kidney disease, stage 3a: Secondary | ICD-10-CM

## 2023-03-19 DIAGNOSIS — I252 Old myocardial infarction: Secondary | ICD-10-CM | POA: Diagnosis not present

## 2023-03-19 DIAGNOSIS — S88112S Complete traumatic amputation at level between knee and ankle, left lower leg, sequela: Secondary | ICD-10-CM | POA: Diagnosis not present

## 2023-03-19 DIAGNOSIS — I25810 Atherosclerosis of coronary artery bypass graft(s) without angina pectoris: Secondary | ICD-10-CM

## 2023-03-19 DIAGNOSIS — S42031A Displaced fracture of lateral end of right clavicle, initial encounter for closed fracture: Secondary | ICD-10-CM | POA: Diagnosis not present

## 2023-03-19 DIAGNOSIS — Z5986 Financial insecurity: Secondary | ICD-10-CM

## 2023-03-19 DIAGNOSIS — E785 Hyperlipidemia, unspecified: Secondary | ICD-10-CM | POA: Diagnosis present

## 2023-03-19 DIAGNOSIS — E1159 Type 2 diabetes mellitus with other circulatory complications: Secondary | ICD-10-CM | POA: Diagnosis not present

## 2023-03-19 DIAGNOSIS — Z7985 Long-term (current) use of injectable non-insulin antidiabetic drugs: Secondary | ICD-10-CM | POA: Diagnosis not present

## 2023-03-19 DIAGNOSIS — I255 Ischemic cardiomyopathy: Secondary | ICD-10-CM | POA: Diagnosis present

## 2023-03-19 DIAGNOSIS — I2510 Atherosclerotic heart disease of native coronary artery without angina pectoris: Secondary | ICD-10-CM | POA: Diagnosis present

## 2023-03-19 DIAGNOSIS — I5022 Chronic systolic (congestive) heart failure: Secondary | ICD-10-CM | POA: Diagnosis not present

## 2023-03-19 DIAGNOSIS — S72001A Fracture of unspecified part of neck of right femur, initial encounter for closed fracture: Secondary | ICD-10-CM | POA: Diagnosis present

## 2023-03-19 DIAGNOSIS — Z95810 Presence of automatic (implantable) cardiac defibrillator: Secondary | ICD-10-CM

## 2023-03-19 DIAGNOSIS — R296 Repeated falls: Secondary | ICD-10-CM | POA: Diagnosis present

## 2023-03-19 DIAGNOSIS — I10 Essential (primary) hypertension: Secondary | ICD-10-CM | POA: Diagnosis not present

## 2023-03-19 DIAGNOSIS — Z8249 Family history of ischemic heart disease and other diseases of the circulatory system: Secondary | ICD-10-CM

## 2023-03-19 DIAGNOSIS — S88112A Complete traumatic amputation at level between knee and ankle, left lower leg, initial encounter: Secondary | ICD-10-CM | POA: Diagnosis present

## 2023-03-19 DIAGNOSIS — Z951 Presence of aortocoronary bypass graft: Secondary | ICD-10-CM | POA: Diagnosis not present

## 2023-03-19 DIAGNOSIS — J449 Chronic obstructive pulmonary disease, unspecified: Secondary | ICD-10-CM | POA: Diagnosis present

## 2023-03-19 DIAGNOSIS — Z9049 Acquired absence of other specified parts of digestive tract: Secondary | ICD-10-CM

## 2023-03-19 DIAGNOSIS — Z79899 Other long term (current) drug therapy: Secondary | ICD-10-CM

## 2023-03-19 DIAGNOSIS — E871 Hypo-osmolality and hyponatremia: Secondary | ICD-10-CM | POA: Diagnosis present

## 2023-03-19 DIAGNOSIS — Y92099 Unspecified place in other non-institutional residence as the place of occurrence of the external cause: Secondary | ICD-10-CM | POA: Diagnosis not present

## 2023-03-19 DIAGNOSIS — W19XXXA Unspecified fall, initial encounter: Secondary | ICD-10-CM | POA: Diagnosis present

## 2023-03-19 DIAGNOSIS — Z955 Presence of coronary angioplasty implant and graft: Secondary | ICD-10-CM

## 2023-03-19 DIAGNOSIS — I493 Ventricular premature depolarization: Secondary | ICD-10-CM | POA: Diagnosis present

## 2023-03-19 DIAGNOSIS — E119 Type 2 diabetes mellitus without complications: Secondary | ICD-10-CM

## 2023-03-19 DIAGNOSIS — Z87891 Personal history of nicotine dependence: Secondary | ICD-10-CM

## 2023-03-19 DIAGNOSIS — I5042 Chronic combined systolic (congestive) and diastolic (congestive) heart failure: Secondary | ICD-10-CM | POA: Diagnosis not present

## 2023-03-19 DIAGNOSIS — Z7982 Long term (current) use of aspirin: Secondary | ICD-10-CM | POA: Diagnosis not present

## 2023-03-19 DIAGNOSIS — Z743 Need for continuous supervision: Secondary | ICD-10-CM | POA: Diagnosis not present

## 2023-03-19 DIAGNOSIS — E1122 Type 2 diabetes mellitus with diabetic chronic kidney disease: Secondary | ICD-10-CM | POA: Diagnosis present

## 2023-03-19 DIAGNOSIS — Z7984 Long term (current) use of oral hypoglycemic drugs: Secondary | ICD-10-CM

## 2023-03-19 DIAGNOSIS — S72011A Unspecified intracapsular fracture of right femur, initial encounter for closed fracture: Secondary | ICD-10-CM | POA: Diagnosis not present

## 2023-03-19 DIAGNOSIS — I472 Ventricular tachycardia, unspecified: Secondary | ICD-10-CM | POA: Diagnosis present

## 2023-03-19 DIAGNOSIS — Z555 Less than a high school diploma: Secondary | ICD-10-CM

## 2023-03-19 DIAGNOSIS — M8580 Other specified disorders of bone density and structure, unspecified site: Secondary | ICD-10-CM | POA: Diagnosis present

## 2023-03-19 DIAGNOSIS — Z8719 Personal history of other diseases of the digestive system: Secondary | ICD-10-CM

## 2023-03-19 DIAGNOSIS — E1151 Type 2 diabetes mellitus with diabetic peripheral angiopathy without gangrene: Secondary | ICD-10-CM | POA: Diagnosis present

## 2023-03-19 DIAGNOSIS — I130 Hypertensive heart and chronic kidney disease with heart failure and stage 1 through stage 4 chronic kidney disease, or unspecified chronic kidney disease: Secondary | ICD-10-CM | POA: Diagnosis not present

## 2023-03-19 DIAGNOSIS — Z885 Allergy status to narcotic agent status: Secondary | ICD-10-CM

## 2023-03-19 HISTORY — DX: Ventricular premature depolarization: I49.3

## 2023-03-19 HISTORY — DX: Ventricular tachycardia, unspecified: I47.20

## 2023-03-19 HISTORY — DX: Non-ST elevation (NSTEMI) myocardial infarction: I21.4

## 2023-03-19 HISTORY — DX: Chronic systolic (congestive) heart failure: I50.22

## 2023-03-19 HISTORY — DX: Necrotizing fasciitis: M72.6

## 2023-03-19 HISTORY — DX: Depression, unspecified: F32.A

## 2023-03-19 HISTORY — DX: Presence of automatic (implantable) cardiac defibrillator: Z95.810

## 2023-03-19 HISTORY — DX: Chronic kidney disease, stage 3a: N18.31

## 2023-03-19 LAB — CBC WITH DIFFERENTIAL/PLATELET
Abs Immature Granulocytes: 0.04 10*3/uL (ref 0.00–0.07)
Basophils Absolute: 0.1 10*3/uL (ref 0.0–0.1)
Basophils Relative: 1 %
Eosinophils Absolute: 0.2 10*3/uL (ref 0.0–0.5)
Eosinophils Relative: 1 %
HCT: 39.5 % (ref 39.0–52.0)
Hemoglobin: 12.9 g/dL — ABNORMAL LOW (ref 13.0–17.0)
Immature Granulocytes: 0 %
Lymphocytes Relative: 17 %
Lymphs Abs: 2.3 10*3/uL (ref 0.7–4.0)
MCH: 29.6 pg (ref 26.0–34.0)
MCHC: 32.7 g/dL (ref 30.0–36.0)
MCV: 90.6 fL (ref 80.0–100.0)
Monocytes Absolute: 0.6 10*3/uL (ref 0.1–1.0)
Monocytes Relative: 5 %
Neutro Abs: 10 10*3/uL — ABNORMAL HIGH (ref 1.7–7.7)
Neutrophils Relative %: 76 %
Platelets: 275 10*3/uL (ref 150–400)
RBC: 4.36 MIL/uL (ref 4.22–5.81)
RDW: 13 % (ref 11.5–15.5)
WBC: 13.1 10*3/uL — ABNORMAL HIGH (ref 4.0–10.5)
nRBC: 0 % (ref 0.0–0.2)

## 2023-03-19 LAB — COMPREHENSIVE METABOLIC PANEL
ALT: 25 U/L (ref 0–44)
AST: 25 U/L (ref 15–41)
Albumin: 3.4 g/dL — ABNORMAL LOW (ref 3.5–5.0)
Alkaline Phosphatase: 111 U/L (ref 38–126)
Anion gap: 10 (ref 5–15)
BUN: 28 mg/dL — ABNORMAL HIGH (ref 6–20)
CO2: 24 mmol/L (ref 22–32)
Calcium: 8.7 mg/dL — ABNORMAL LOW (ref 8.9–10.3)
Chloride: 102 mmol/L (ref 98–111)
Creatinine, Ser: 1.76 mg/dL — ABNORMAL HIGH (ref 0.61–1.24)
GFR, Estimated: 47 mL/min — ABNORMAL LOW (ref >60)
Glucose, Bld: 132 mg/dL — ABNORMAL HIGH (ref 70–99)
Potassium: 4.4 mmol/L (ref 3.5–5.1)
Sodium: 136 mmol/L (ref 135–145)
Total Bilirubin: 1.1 mg/dL (ref 0.3–1.2)
Total Protein: 6.8 g/dL (ref 6.5–8.1)

## 2023-03-19 LAB — DIGOXIN LEVEL: Digoxin Level: 0.5 ng/mL — ABNORMAL LOW (ref 0.8–2.0)

## 2023-03-19 LAB — MRSA NEXT GEN BY PCR, NASAL: MRSA by PCR Next Gen: NOT DETECTED

## 2023-03-19 LAB — GLUCOSE, CAPILLARY: Glucose-Capillary: 135 mg/dL — ABNORMAL HIGH (ref 70–99)

## 2023-03-19 MED ORDER — DIGOXIN 125 MCG PO TABS
0.1250 mg | ORAL_TABLET | Freq: Every day | ORAL | Status: DC
  Administered 2023-03-19 – 2023-03-22 (×4): 0.125 mg via ORAL
  Filled 2023-03-19 (×4): qty 1

## 2023-03-19 MED ORDER — SPIRONOLACTONE 25 MG PO TABS
25.0000 mg | ORAL_TABLET | Freq: Every day | ORAL | Status: DC
  Administered 2023-03-19: 25 mg via ORAL
  Filled 2023-03-19 (×2): qty 1

## 2023-03-19 MED ORDER — VENLAFAXINE HCL ER 75 MG PO CP24
75.0000 mg | ORAL_CAPSULE | Freq: Every day | ORAL | Status: DC
  Administered 2023-03-20 – 2023-03-23 (×4): 75 mg via ORAL
  Filled 2023-03-19 (×4): qty 1

## 2023-03-19 MED ORDER — CARVEDILOL 3.125 MG PO TABS
3.1250 mg | ORAL_TABLET | Freq: Two times a day (BID) | ORAL | Status: DC
  Administered 2023-03-19 – 2023-03-23 (×8): 3.125 mg via ORAL
  Filled 2023-03-19 (×8): qty 1

## 2023-03-19 MED ORDER — AMIODARONE HCL 200 MG PO TABS
200.0000 mg | ORAL_TABLET | Freq: Every day | ORAL | Status: DC
  Administered 2023-03-19 – 2023-03-23 (×5): 200 mg via ORAL
  Filled 2023-03-19 (×5): qty 1

## 2023-03-19 MED ORDER — HYDROMORPHONE HCL 2 MG PO TABS
2.0000 mg | ORAL_TABLET | ORAL | Status: DC | PRN
  Administered 2023-03-19 – 2023-03-20 (×4): 2 mg via ORAL
  Filled 2023-03-19 (×4): qty 1

## 2023-03-19 MED ORDER — ACETAMINOPHEN 325 MG PO TABS: 650.0000 mg | ORAL_TABLET | Freq: Four times a day (QID) | ORAL | Status: DC | PRN

## 2023-03-19 MED ORDER — ROSUVASTATIN CALCIUM 20 MG PO TABS
20.0000 mg | ORAL_TABLET | Freq: Every day | ORAL | Status: DC
  Administered 2023-03-19 – 2023-03-23 (×4): 20 mg via ORAL
  Filled 2023-03-19 (×4): qty 1

## 2023-03-19 MED ORDER — EMPAGLIFLOZIN 10 MG PO TABS: 10.0000 mg | ORAL_TABLET | Freq: Every day | ORAL | Status: DC

## 2023-03-19 MED ORDER — MELATONIN 5 MG PO TABS
10.0000 mg | ORAL_TABLET | Freq: Every evening | ORAL | Status: DC | PRN
  Administered 2023-03-19 – 2023-03-22 (×3): 10 mg via ORAL
  Filled 2023-03-19 (×4): qty 2

## 2023-03-19 MED ORDER — TIZANIDINE HCL 4 MG PO TABS
4.0000 mg | ORAL_TABLET | Freq: Four times a day (QID) | ORAL | Status: DC | PRN
  Administered 2023-03-19 – 2023-03-23 (×8): 4 mg via ORAL
  Filled 2023-03-19 (×8): qty 1

## 2023-03-19 MED ORDER — HYDROMORPHONE HCL 1 MG/ML IJ SOLN
1.0000 mg | Freq: Once | INTRAMUSCULAR | Status: AC
  Administered 2023-03-19: 1 mg via INTRAVENOUS
  Filled 2023-03-19: qty 1

## 2023-03-19 MED ORDER — ASPIRIN 81 MG PO TBEC
81.0000 mg | DELAYED_RELEASE_TABLET | Freq: Two times a day (BID) | ORAL | Status: DC
  Administered 2023-03-19 – 2023-03-23 (×7): 81 mg via ORAL
  Filled 2023-03-19 (×7): qty 1

## 2023-03-19 MED ORDER — FENTANYL CITRATE PF 50 MCG/ML IJ SOSY
75.0000 ug | PREFILLED_SYRINGE | Freq: Once | INTRAMUSCULAR | Status: AC
  Administered 2023-03-19: 75 ug via INTRAVENOUS
  Filled 2023-03-19: qty 2

## 2023-03-19 MED ORDER — ONDANSETRON HCL 4 MG PO TABS: 4.0000 mg | ORAL_TABLET | Freq: Four times a day (QID) | ORAL | Status: DC | PRN

## 2023-03-19 MED ORDER — GABAPENTIN 100 MG PO CAPS
200.0000 mg | ORAL_CAPSULE | Freq: Every day | ORAL | Status: DC
  Administered 2023-03-19 – 2023-03-22 (×4): 200 mg via ORAL
  Filled 2023-03-19 (×4): qty 2

## 2023-03-19 MED ORDER — GLIPIZIDE 5 MG PO TABS
5.0000 mg | ORAL_TABLET | Freq: Every day | ORAL | Status: DC
  Administered 2023-03-20 – 2023-03-23 (×4): 5 mg via ORAL
  Filled 2023-03-19 (×4): qty 1

## 2023-03-19 MED ORDER — DOCUSATE SODIUM 100 MG PO CAPS
100.0000 mg | ORAL_CAPSULE | Freq: Two times a day (BID) | ORAL | Status: DC
  Filled 2023-03-19 (×6): qty 1

## 2023-03-19 MED ORDER — ONDANSETRON HCL 4 MG/2ML IJ SOLN: 4.0000 mg | Freq: Four times a day (QID) | INTRAMUSCULAR | Status: DC | PRN

## 2023-03-19 MED ORDER — ACETAMINOPHEN 650 MG RE SUPP: 650.0000 mg | Freq: Four times a day (QID) | RECTAL | Status: DC | PRN

## 2023-03-19 NOTE — ED Notes (Signed)
Got patient on the monitor did EKG shown to Dr Rosalia Hammers got patient a warm blanket patient is resting with call bell in reach and family at bedside

## 2023-03-19 NOTE — Assessment & Plan Note (Signed)
Stable. Euvolemic. ?

## 2023-03-19 NOTE — Assessment & Plan Note (Signed)
Stable. No chest pain.

## 2023-03-19 NOTE — Assessment & Plan Note (Signed)
Add SSI. Continue home DM meds.

## 2023-03-19 NOTE — Consult Note (Signed)
Reason for Consult: Right hip displaced femoral neck fracture Referring Physician: Redge Gainer, ED P  Adam James is an 52 y.o. male.  HPI: The patient is a 52 year old gentleman with multiple medical problems.  He unfortunately sustained a mechanical fall earlier today landing on his right hip.  He had the inability to ambulate and was seen at the [facility] emergency room and found to have a displaced right hip femoral neck fracture.  Orthopedic surgery has been consulted to address this injury.  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  He also had sustained a recent mechanical fall in which he injured/fractured his right clavicle.  He is in a sling dealing with that.  He was graciously admitted to the medical service today given his numerous comorbidities.  He is also being seen by cardiology.  He does report significant right hip pain.  I have reviewed his x-rays and he does have a displaced right hip subcapital femoral neck fracture.  Past Medical History:  Diagnosis Date   Acute on chronic systolic CHF (congestive heart failure) (HCC) 11/04/2021   Anemia    GI bleed; 03/13/11 EGD: relux esophagitis with ulceration/clot   CAD (coronary artery disease)    Cardiomyopathy (HCC)    Chronic HFrEF (heart failure with reduced ejection fraction) (HCC)    CKD stage 3a, GFR 45-59 ml/min (HCC)    Dehiscence of amputation stump of left lower extremity (HCC) 05/06/2022   Diabetic foot ulcer (HCC)    Dyslipidemia    Dyspnea    08/05/21- not recently.   Hypertension    Myocardial infarction (HCC)    Necrotizing fasciitis (HCC)    NSTEMI (non-ST elevated myocardial infarction) (HCC)    PVC's (premature ventricular contractions)    Type 2 diabetes mellitus (HCC)    V-tach [facility])     Past Surgical History:  Procedure Laterality Date   AMPUTATION Left 04/30/2021   Procedure: LEFT FOOT 5TH  RAY AMPUTATION APPLICATION OF A-CELL POWDER;  Surgeon: [name], MD;  Location: [facility] OR;  Service: Orthopedics;  Laterality: Left;   AMPUTATION Left 07/30/2021   Procedure: LEFT BELOW KNEE AMPUTATION;  Surgeon: [name], MD;  Location: [facility] OR;  Service: Orthopedics;  Laterality: Left;   APPENDECTOMY     APPLICATION OF WOUND VAC Left 03/03/2021   Procedure: APPLICATION OF WOUND VAC;  Surgeon: Tarry Kos, MD;  Location: [facility] OR;  Service: Orthopedics;  Laterality: Left;   APPLICATION OF WOUND VAC  03/07/2021   Procedure: APPLICATION OF WOUND VAC;  Surgeon: [name], MD;  Location: [facility] OR;  Service: Orthopedics;;   APPLICATION OF WOUND VAC  07/30/2021   Procedure: APPLICATION OF WOUND VAC;  Surgeon: [name], MD;  Location: [facility] OR;  Service: Orthopedics;;   CARDIAC CATHETERIZATION     COLON SURGERY     CORONARY ARTERY BYPASS GRAFT N/A 05/22/2021   Procedure: CORONARY ARTERY BYPASS GRAFTING (CABG) X 3 USING LEFT INTERNAL MAMMARY ARTERY AND RIGHT ENDOSCOPIC GREATER SAPHENOUS VEIN CONDUITS;  Surgeon: Corliss Skains, MD;  Location: [facility] OR;  Service: Open Heart Surgery;  Laterality: N/A;   ENDOVEIN HARVEST OF GREATER SAPHENOUS VEIN Right 05/22/2021   Procedure: ENDOVEIN HARVEST OF GREATER SAPHENOUS VEIN;  Surgeon: Corliss Skains, MD;  Location: [facility] OR;  Service: Open Heart Surgery;  Laterality: Right;   ESOPHAGOGASTRODUODENOSCOPY (EGD) WITH PROPOFOL N/A  03/12/2021   Procedure: ESOPHAGOGASTRODUODENOSCOPY (EGD) WITH PROPOFOL;  Surgeon: Jenel Lucks, MD;  Location: [facility] ENDOSCOPY;  Service: Gastroenterology;  Laterality: N/A;   I & D EXTREMITY Left 02/24/2021   Procedure: IRRIGATION AND DEBRIDEMENT EXTREMITY;  Surgeon: Myrene Galas, MD;  Location: [facility] OR;  Service: Orthopedics;  Laterality: Left;   I & D EXTREMITY Left 02/26/2021   Procedure: IRRIGATION AND DEBRIDEMENT OF LEG;  Surgeon: [name], MD;  Location: [facility] OR;  Service: Orthopedics;  Laterality: Left;   I & D  EXTREMITY Left 03/03/2021   Procedure: LEFT LEG IRRIGATION AND DEBRIDEMENT;  Surgeon: Tarry Kos, MD;  Location: [facility] OR;  Service: Orthopedics;  Laterality: Left;   I & D EXTREMITY Left 03/07/2021   Procedure: REPEAT DEBRIDEMENT LEFT LEG;  Surgeon: [name], MD;  Location: [facility] OR;  Service: Orthopedics;  Laterality: Left;   ICD IMPLANT N/A 02/24/2023   Procedure: ICD IMPLANT;  Surgeon: Duke Salvia, MD;  Location: [facility] INVASIVE CV LAB;  Service: Cardiovascular;  Laterality: N/A;   IR FLUORO GUIDE CV LINE RIGHT  03/10/2021   IR REMOVAL TUN CV CATH W/O FL  03/24/2021   IR US GUIDE VASC ACCESS RIGHT  03/10/2021   LEFT HEART CATH AND CORONARY ANGIOGRAPHY N/A 09/13/2020   Procedure: LEFT HEART CATH AND CORONARY ANGIOGRAPHY;  Surgeon: Lennette Bihari, MD;  Location: [facility] INVASIVE CV LAB;  Service: Cardiovascular;  Laterality: N/A;   LEFT HEART CATH AND CORONARY ANGIOGRAPHY N/A 03/28/2021   Procedure: LEFT HEART CATH AND CORONARY ANGIOGRAPHY;  Surgeon: Laurey Morale, MD;  Location: [facility] INVASIVE CV LAB;  Service: Cardiovascular;  Laterality: N/A;   RIGHT HEART CATH N/A 03/26/2021   Procedure: RIGHT HEART CATH;  Surgeon: Laurey Morale, MD;  Location: [facility] INVASIVE CV LAB;  Service: Cardiovascular;  Laterality: N/A;   STUMP REVISION Left 05/06/2022   Procedure: REVISION LEFT BELOW KNEE AMPUTATION;  Surgeon: [name], MD;  Location: [facility] OR;  Service: Orthopedics;  Laterality: Left;   TEE WITHOUT CARDIOVERSION N/A 05/22/2021   Procedure: TRANSESOPHAGEAL ECHOCARDIOGRAM (TEE);  Surgeon: Corliss Skains, MD;  Location: [facility] OR;  Service: Open Heart Surgery;  Laterality: N/A;   TOE AMPUTATION  2020    Family History  Problem Relation Age of Onset   Hypertension Mother    Heart failure Father    Hypertension Brother    Congestive Heart Failure Brother     Social History:  reports that he quit smoking about 2 years ago. His smoking use included cigarettes. He started smoking about 38 years ago. He  has a 108 pack-year smoking history. He has never used smokeless tobacco. He reports that he does not currently use alcohol. He reports that he does not currently use drugs after having used the following drugs: Marijuana.  Allergies:  Allergies  Allergen Reactions   Morphine Itching    Itching under skin    Medications: I have reviewed the patient's current medications.  Results for orders placed or performed during the hospital encounter of 03/19/23 (from the past 48 hour(s))  CBC with Differential     Status: Abnormal   Collection Time: 03/19/23  3:16 PM  Result Value Ref Range   WBC 13.1 (H) 4.0 - 10.5 K/uL   RBC 4.36 4.22 - 5.81 MIL/uL   Hemoglobin 12.9 (L) 13.0 - 17.0 g/dL   HCT 16.1 09.6 - 04.5 %   MCV 90.6 80.0 - 100.0 fL   MCH 29.6 26.0 - 34.0  pg   MCHC 32.7 30.0 - 36.0 g/dL   RDW 16.1 09.6 - 04.5 %   Platelets 275 150 - 400 K/uL   nRBC 0.0 0.0 - 0.2 %   Neutrophils Relative % 76 %   Neutro Abs 10.0 (H) 1.7 - 7.7 K/uL   Lymphocytes Relative 17 %   Lymphs Abs 2.3 0.7 - 4.0 K/uL   Monocytes Relative 5 %   Monocytes Absolute 0.6 0.1 - 1.0 K/uL   Eosinophils Relative 1 %   Eosinophils Absolute 0.2 0.0 - 0.5 K/uL   Basophils Relative 1 %   Basophils Absolute 0.1 0.0 - 0.1 K/uL   Immature Granulocytes 0 %   Abs Immature Granulocytes 0.04 0.00 - 0.07 K/uL    Comment: Performed at [facility], [address] [place], [place] [address]  Comprehensive metabolic panel     Status: Abnormal   Collection Time: 03/19/23  3:16 PM  Result Value Ref Range   Sodium 136 135 - 145 mmol/L   Potassium 4.4 3.5 - 5.1 mmol/L   Chloride 102 98 - 111 mmol/L   CO2 24 22 - 32 mmol/L   Glucose, Bld 132 (H) 70 - 99 mg/dL    Comment: Glucose reference range applies only to samples taken after fasting for at least 8 hours.   BUN 28 (H) 6 - 20 mg/dL   Creatinine, Ser 1.91 (H) 0.61 - 1.24 mg/dL   Calcium 8.7 (L) 8.9 - 10.3 mg/dL   Total Protein 6.8 6.5 - 8.1 g/dL   Albumin 3.4 (L) 3.5  - 5.0 g/dL   AST 25 15 - 41 U/L   ALT 25 0 - 44 U/L   Alkaline Phosphatase 111 38 - 126 U/L   Total Bilirubin 1.1 0.3 - 1.2 mg/dL   GFR, Estimated 47 (L) >60 mL/min    Comment: (NOTE) Calculated using the CKD-EPI Creatinine Equation (2021)    Anion gap 10 5 - 15    Comment: Performed at [facility], [address] [place], [place] [address]  Digoxin level     Status: Abnormal   Collection Time: 03/19/23  3:29 PM  Result Value Ref Range   Digoxin Level 0.5 (L) 0.8 - 2.0 ng/mL    Comment: Performed at [facility], [address] [place], [place] [address]  Glucose, capillary     Status: Abnormal   Collection Time: 03/19/23  4:39 PM  Result Value Ref Range   Glucose-Capillary 135 (H) 70 - 99 mg/dL    Comment: Glucose reference range applies only to samples taken after fasting for at least 8 hours.    DG Chest Port 1 View  Result Date: 03/19/2023 CLINICAL DATA:  Fall at home, hip fracture. In a separate recent fall the patient fractured his right clavicle, treated by Dr. Lajoyce Corners. EXAM: PORTABLE CHEST 1 VIEW COMPARISON:  02/24/2023 FINDINGS: Interval fracture the right distal clavicle with mild comminution. AICD noted.  Prior CABG.  Heart size within normal limits. Old right lateral fourth and fifth rib deformities from healed fractures. The lungs appear clear.  Costophrenic angles are both excluded. IMPRESSION: 1. Interval acute fracture of the right distal clavicle with mild comminution. 2. Old healed right lateral fourth and fifth rib fractures. 3. Prior CABG and AICD. Electronically Signed   By: Gaylyn Rong M.D.   On: 03/19/2023 17:40   DG Hip Port [place] or [place] Pelvis 1 View Right  Result Date: 03/19/2023 CLINICAL DATA:  fall EXAM: DG HIP (WITH OR WITHOUT PELVIS) 1V PORT RIGHT COMPARISON:  None Available. FINDINGS: Suboptimal exam. Mildly displaced and angulated subcapital fracture of right femoral neck noted. No other acute fracture or dislocation. No aggressive  osseous lesion. Normal and symmetric sacroiliac joints. Visualized sacral arcuate lines are unremarkable. Unremarkable symphysis pubis. There are mild degenerative changes of the right hip joint without significant joint space narrowing. Osteophytosis of the superior acetabulum. No radiopaque foreign bodies. IMPRESSION: 1. Mildly displaced and angulated subcapital fracture of the right femoral neck. Electronically Signed   By: Jules Schick M.D.   On: 03/19/2023 14:52    Review of Systems Blood pressure (!) 158/84, pulse 98, temperature 98.3 F (36.8 C), temperature source Oral, resp. rate 17, height 6\' 3"  (1.905 m), weight 99 kg, SpO2 92%. Physical Exam Vitals reviewed.  Constitutional:      Appearance: Normal appearance. He is normal weight.  Cardiovascular:     Rate and Rhythm: Normal rate.  Pulmonary:     Effort: Pulmonary effort is normal.  Musculoskeletal:     Cervical back: Normal range of motion.     Right hip: Tenderness and bony tenderness present. Decreased range of motion. Decreased strength.  Neurological:     Mental Status: He is alert and oriented to person, place, and time.  Psychiatric:        Behavior: Behavior normal.     Assessment/Plan: Right hip with displaced subcapital femoral neck fracture  Given his young age I have recommended a right total hip arthroplasty to treat this significant displaced fracture.  I explained to the patient the risks and benefits of the surgery and what to expect from an intraoperative and postoperative standpoint.  I explained the nonoperative and operative treatment.  Given his young age and high level of function, it is warranted to recommend a hip replacement to address this acute injury.  He understands this fully.  We will plan on surgery for sometime tomorrow.  He will be n.p.o. after midnight tonight.  Kathryne Hitch 03/19/2023, 6:28 PM

## 2023-03-19 NOTE — Consult Note (Addendum)
Cardiology Consultation   Patient ID: Dvontae Minten MRN: [id number]; DOB: [birth year]  Admit date: 03/19/2023 Date of Consult: 03/19/2023  PCP:  Hoy Register, MD   [facility] Providers Cardiologist:  Norman Herrlich, MD  Electrophysiologist:  Sherryl Manges, MD  Advanced Heart Failure:  Marca Ancona, MD       Patient Profile:   Barren Goldson is a 52 y.o. 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 Imogene Burn.  History of Present Illness:   Mr. Jepperson has extremely complex PMH as above. He had remote PCI 2007 in [place], details unclear. He was admitted in 08/2020 for LE edema/foot ulcer with mildly elevated troponin. Subsequent outpatient cath showed multivessel CAD prompting outpatient cMRI to assess for viability. He was also referred to SW due to financial concerns and no payor source. cMRI showed 40% and viable myocardium. He saw Dr Cliffton Asters in July 2022 and was set up for CABG that August but pre-CABG labs showed WBC 25k prompting ED eval. He was subsequently admitted 02/2021 for left foot infection, osteomyelitis, and necrotizing fasciitis. He required multiple debridements for limb salvage including left 5th metatarsal amputation. Hospital course complicated by erosive esophagitis on EGD requiring multiple transfusions. He was readmitted later that month with CHF exacerbation requiring Lasix and metolazone, c/b AKI. Repeat cath showed minimal change from prior. After full treatment for leg issues, underwent CABG 04/2021 with LIMA-LAD, SVG-OM1, SVG-OM3.  PDA too small to graft. 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 He's been followed by  the Advanced HF clinic, last OV 08/2022, epleronone restarted (patient did not want to take spiro because he read it can block testosterone). He was recommended for f/u 11/2022 which did not occur. Their note also outlines h/o frequent PVCs and VT perioperatively, treated with amiodarone. Repeat echo 11/2022 showed EF 25-30% + anteroseptal, inferoseptal, and inferior hypokinesis. He underwent ICD implantation 01/2023 by Dr. Graciela Husbands.   He presented this admission with mechanical fall and right hip fracture. He also had a fall last week and injured his right collarbone. Knee film also mentions severe, permeative osteopenia which may be osteoporosis but a permeative marrow process such as myeloma was not able to be excluded. He's continued to have left knee pain and today he felt like his prosthesis gave way while trying to walk. He denies any dizziness, near-syncope, or syncope. Denies any recent CP, SOB, palpitations, edema, orthopnea, or ICD shocks. He is generally sedentary aside from sparse ADLs. Cardiology is asked to see for pre-operative evaluation. SBP 160s. EKG is stable showing NSR, LVH with QRS widening and nonspecific STTW changes similar to prior. Cr 1.76, essentially at baseline. He is hypertensive but did not take any of his meds yet today.  Of note, Sherryll Burger is no longer on his med list. He originally thought perhaps Dr. Shirlee Latch discontinued it. I do not see any mention of this. There is a Psychologist, sport and exercise note from 02/17/2023 where it was struck from the list due to patient preference. The patient is not sure if he is taking it or not. It was advised to continue on his 02/24/23 ICD discharge AVS.  Past Medical History:  Diagnosis Date  Acute on chronic systolic CHF (congestive heart failure) (HCC) 11/04/2021   Anemia    GI bleed; 03/13/11 EGD: relux esophagitis with ulceration/clot   CAD (coronary artery disease)    Cardiomyopathy (HCC)    Chronic HFrEF (heart failure with reduced ejection fraction)  (HCC)    CKD stage 3a, GFR 45-59 ml/min (HCC)    Dehiscence of amputation stump of left lower extremity (HCC) 05/06/2022   Diabetic foot ulcer (HCC)    Dyslipidemia    Dyspnea    08/05/21- not recently.   Hypertension    Myocardial infarction (HCC)    Necrotizing fasciitis (HCC)    NSTEMI (non-ST elevated myocardial infarction) (HCC)    PVC's (premature ventricular contractions)    Type 2 diabetes mellitus (HCC)    V-tach [facility])     Past Surgical History:  Procedure Laterality Date   AMPUTATION Left 04/30/2021   Procedure: LEFT FOOT 5TH RAY AMPUTATION APPLICATION OF A-CELL POWDER;  Surgeon: [name], MD;  Location: [facility] OR;  Service: Orthopedics;  Laterality: Left;   AMPUTATION Left 07/30/2021   Procedure: LEFT BELOW KNEE AMPUTATION;  Surgeon: [name], MD;  Location: [facility] OR;  Service: Orthopedics;  Laterality: Left;   APPENDECTOMY     APPLICATION OF WOUND VAC Left 03/03/2021   Procedure: APPLICATION OF WOUND VAC;  Surgeon: Tarry Kos, MD;  Location: [facility] OR;  Service: Orthopedics;  Laterality: Left;   APPLICATION OF WOUND VAC  03/07/2021   Procedure: APPLICATION OF WOUND VAC;  Surgeon: [name], MD;  Location[facility] OR;  Service: Orthopedics;;   APPLICATION OF WOUND VAC  07/30/2021   Procedure: APPLICATION OF WOUND VAC;  Surgeon: [name], MD;  Location: [facility] OR;  Service: Orthopedics;;   CARDIAC CATHETERIZATION     COLON SURGERY     CORONARY ARTERY BYPASS GRAFT N/A 05/22/2021   Procedure: CORONARY ARTERY BYPASS GRAFTING (CABG) X 3 USING LEFT INTERNAL MAMMARY ARTERY AND RIGHT ENDOSCOPIC GREATER SAPHENOUS VEIN CONDUITS;  Surgeon: Corliss Skains, MD;  Location: [facility] OR;  Service: Open Heart Surgery;  Laterality: N/A;   ENDOVEIN HARVEST OF GREATER SAPHENOUS VEIN Right 05/22/2021   Procedure: ENDOVEIN HARVEST OF GREATER SAPHENOUS VEIN;  Surgeon: Corliss Skains, MD;  Location: [facility] OR;  Service: Open Heart Surgery;  Laterality: Right;   ESOPHAGOGASTRODUODENOSCOPY (EGD)  WITH PROPOFOL N/A 03/12/2021   Procedure: ESOPHAGOGASTRODUODENOSCOPY (EGD) WITH PROPOFOL;  Surgeon: Jenel Lucks, MD;  Location: [facility] ENDOSCOPY;  Service: Gastroenterology;  Laterality: N/A;   I & D EXTREMITY Left 02/24/2021   Procedure: IRRIGATION AND DEBRIDEMENT EXTREMITY;  Surgeon: Myrene Galas, MD;  Location: [facility] OR;  Service: Orthopedics;  Laterality: Left;   I & D EXTREMITY Left 02/26/2021   Procedure: IRRIGATION AND DEBRIDEMENT OF LEG;  Surgeon: [name], MD;  Location: [facility] OR;  Service: Orthopedics;  Laterality: Left;   I & D EXTREMITY Left 03/03/2021   Procedure: LEFT LEG IRRIGATION AND DEBRIDEMENT;  Surgeon: Tarry Kos, MD;  Location: [facility] OR;  Service: Orthopedics;  Laterality: Left;   I & D EXTREMITY Left 03/07/2021   Procedure: REPEAT DEBRIDEMENT LEFT LEG;  Surgeon: [name], MD;  Location: [facility] OR;  Service: Orthopedics;  Laterality: Left;   ICD IMPLANT N/A 02/24/2023   Procedure: ICD IMPLANT;  Surgeon: Duke Salvia, MD;  Location: [facility] INVASIVE CV LAB;  Service: Cardiovascular;  Laterality: N/A;   IR FLUORO GUIDE CV LINE RIGHT  03/10/2021   IR REMOVAL TUN CV CATH W/O  FL  03/24/2021   IR US GUIDE VASC ACCESS RIGHT  03/10/2021   LEFT HEART CATH AND CORONARY ANGIOGRAPHY N/A 09/13/2020   Procedure: LEFT HEART CATH AND CORONARY ANGIOGRAPHY;  Surgeon: Lennette Bihari, MD;  Location: [facility] INVASIVE CV LAB;  Service: Cardiovascular;  Laterality: N/A;   LEFT HEART CATH AND CORONARY ANGIOGRAPHY N/A 03/28/2021   Procedure: LEFT HEART CATH AND CORONARY ANGIOGRAPHY;  Surgeon: Laurey Morale, MD;  Location: [facility] INVASIVE CV LAB;  Service: Cardiovascular;  Laterality: N/A;   RIGHT HEART CATH N/A 03/26/2021   Procedure: RIGHT HEART CATH;  Surgeon: Laurey Morale, MD;  Location: [facility] INVASIVE CV LAB;  Service: Cardiovascular;  Laterality: N/A;   STUMP REVISION Left 05/06/2022   Procedure: REVISION LEFT BELOW KNEE AMPUTATION;  Surgeon: [name], MD;  Location: [facility] OR;  Service: Orthopedics;   Laterality: Left;   TEE WITHOUT CARDIOVERSION N/A 05/22/2021   Procedure: TRANSESOPHAGEAL ECHOCARDIOGRAM (TEE);  Surgeon: Corliss Skains, MD;  Location: [facility] OR;  Service: Open Heart Surgery;  Laterality: N/A;   TOE AMPUTATION  2020     Home Medications:  Prior to Admission medications   Medication Sig Start Date End Date Taking? Authorizing Provider  amiodarone (PACERONE) 200 MG tablet TAKE 1/2 TABLET(100 MG) BY MOUTH DAILY 03/19/23  Yes Bensimhon, Bevelyn Buckles, MD  aspirin EC 81 MG tablet Take 1 tablet (81 mg total) by mouth daily. 08/19/22  Yes Milford, Anderson Malta, FNP  atorvastatin (LIPITOR) 80 MG tablet Take 80 mg by mouth daily.   Yes [provider]  carvedilol (COREG) 3.125 MG tablet Take 1 tablet (3.125 mg total) by mouth 2 (two) times daily. 08/19/22  Yes Milford, Anderson Malta, FNP  cephALEXin (KEFLEX) 500 MG capsule Take 1 capsule (500 mg total) by mouth 2 (two) times daily. 03/10/23  Yes Marinus Maw, MD  digoxin (LANOXIN) 0.125 MG tablet TAKE 1 TABLET(0.125 MG) BY MOUTH DAILY 01/14/23  Yes Milford, Anderson Malta, FNP  empagliflozin (JARDIANCE) 10 MG TABS tablet Take 1 tablet (10 mg total) by mouth daily. Please make appointment with Dr. Alvis Lemmings for more refills. 01/14/23  Yes Hoy Register, MD  eplerenone (INSPRA) 25 MG tablet TAKE 1 TABLET(25 MG) BY MOUTH DAILY 02/08/23  Yes Calhoun, [place], FNP  gabapentin (NEURONTIN) 300 MG capsule TAKE 1 CAPSULE(300 MG) BY MOUTH AT BEDTIME 03/17/23  Yes Marcine Matar, MD  glipiZIDE (GLUCOTROL) 5 MG tablet Take 1 tablet (5 mg total) by mouth daily before breakfast. 08/31/22  Yes McClung, Angela M, PA-C  oxyCODONE (OXY IR/ROXICODONE) 5 MG immediate release tablet Take 1 tablet (5 mg total) by mouth every 4 (four) hours as needed for severe pain. 03/16/23  Yes [name], MD  rosuvastatin (CRESTOR) 20 MG tablet Take 1 tablet (20 mg total) by mouth daily. 12/04/22  Yes Newlin, Odette Horns, MD  Semaglutide, 1 MG/DOSE, 4 MG/3ML SOPN Inject 1 mg as  directed once a week. 12/02/22  Yes Hoy Register, MD  venlafaxine XR (EFFEXOR-XR) 75 MG 24 hr capsule TAKE 1 CAPSULE(75 MG) BY MOUTH DAILY WITH BREAKFAST 03/16/23  Yes Newlin, Enobong, MD  Blood Pressure Monitoring (BLOOD PRESSURE CUFF) MISC 1 each by Does not apply route as directed. 08/31/22   Anders Simmonds, PA-C  glucose blood (TRUE METRIX BLOOD GLUCOSE TEST) test strip Use as instructed twice daily 05/12/21   Mayers, Cari S, PA-C  Semaglutide,0.25 or 0.5MG /DOS, (OZEMPIC, 0.25 OR 0.5 MG/DOSE,) 2 MG/3ML SOPN Inject 0.5 mg into the skin once a  week. For 4 weeks then increase to 1mg  Patient not taking: Reported on 03/19/2023 12/02/22   Hoy Register, MD  TRUEplus Lancets 28G MISC Use as directed twice daily at 8 am and 10 pm. 05/12/21   Mayers, Kasandra Knudsen, PA-C    Inpatient Medications: Scheduled Meds:  amiodarone  200 mg Oral Daily   aspirin EC  81 mg Oral BID   carvedilol  3.125 mg Oral BID WC   digoxin  0.125 mg Oral Daily   docusate sodium  100 mg Oral BID   empagliflozin  10 mg Oral Daily   gabapentin  200 mg Oral QHS   [START ON 03/20/2023] glipiZIDE  5 mg Oral QAC breakfast    HYDROmorphone (DILAUDID) injection  1 mg Intravenous Once   rosuvastatin  20 mg Oral Daily   spironolactone  25 mg Oral Daily   [START ON 03/20/2023] venlafaxine XR  75 mg Oral Q breakfast   Continuous Infusions:  PRN Meds: acetaminophen **OR** acetaminophen, HYDROmorphone, melatonin, ondansetron **OR** ondansetron (ZOFRAN) IV  Allergies:    Allergies  Allergen Reactions   Morphine Itching    Itching under skin    Social History:   Social History   Socioeconomic History   Marital status: Single    Spouse name: Not on file   Number of children: 1   Years of education: Not on file   Highest education level: 8th grade  Occupational History   Occupation: none    Comment: former "[employer]"  Tobacco Use   Smoking status: Former    Current packs/day: 0.00    Average packs/day: 3.0 packs/day for 36.0  years (108.0 ttl pk-yrs)    Types: Cigarettes    Start date: 08/27/1984    Quit date: 08/27/2020    Years since quitting: 2.5   Smokeless tobacco: Never  Vaping Use   Vaping status: Never Used  Substance and Sexual Activity   Alcohol use: Not Currently    Comment: Very rare   Drug use: Not Currently    Types: Marijuana    Comment: smoked marijuana in his youth(teenager)   Sexual activity: Not on file  Other Topics Concern   Not on file  Social History Narrative   Not on file   Social Determinants of Health   Financial Resource Strain: High Risk (02/20/2022)   Overall Financial Resource Strain (CARDIA)    Difficulty of Paying Living Expenses: Hard  Food Insecurity: Food Insecurity Present (02/04/2022)   Hunger Vital Sign    Worried About Running Out of Food in the Last Year: Sometimes true    Ran Out of Food in the Last Year: Sometimes true  Transportation Needs: No Transportation Needs (11/06/2021)   PRAPARE - Administrator, Civil Service (Medical): No    Lack of Transportation (Non-Medical): No  Physical Activity: Not on file  Stress: Not on file  Social Connections: Not on file  Intimate Partner Violence: Not on file    Family History:   Family History  Problem Relation Age of Onset   Hypertension Mother    Heart failure Father    Hypertension Brother    Congestive Heart Failure Brother      ROS:  Please see the history of present illness.  All other ROS reviewed and negative.     Physical Exam/Data:   Vitals:   03/19/23 1415 03/19/23 1430 03/19/23 1445 03/19/23 1500  BP: (!) 165/83 (!) 162/84 (!) 164/84 (!) 167/87  Pulse: 88 89 89 91  Resp: 10 16 14 14   Temp:      TempSrc:      SpO2: 96% 98% 97% 98%  Weight:      Height:       No intake or output data in the 24 hours ending 03/19/23 1656    03/19/2023   12:47 PM 02/24/2023    6:39 AM 12/16/2022    3:55 PM  Last 3 Weights  Weight (lbs) 218 lb 4.1 oz 220 lb 260 lb  Weight (kg) 99 kg 99.791 kg  117.935 kg     Body mass index is 27.28 kg/m.  General: Well developed, well nourished, in no acute distress. Head: Normocephalic, atraumatic, sclera non-icteric, no xanthomas, nares are without discharge. Neck: Negative for carotid bruits. JVP not elevated. Lungs: Clear bilaterally to auscultation without wheezes, rales, or rhonchi. Breathing is unlabored. Heart: RRR S1 S2 without murmurs, rubs, or gallops. ICD site L upper chest with mild erythema and scabbing, no pus or swelling Abdomen: Soft, non-tender, non-distended with normoactive bowel sounds. No rebound/guarding. Extremities: No clubbing or cyanosis. No edema. S/p L BKA. Neuro: Alert and oriented X 3. Moves all extremities spontaneously. Psych:  Responds to questions appropriately with a normal affect.   EKG:  The EKG was personally reviewed and demonstrates:  NSR 87bpm, LVH with secondary repol abnormalities and NSIVCD QRS similar to prior, QTc Telemetry:  Telemetry was personally reviewed and demonstrates:  NSR, rare PVC  Relevant CV Studies: 2d echo 11/2022   1. Anteroseptal, inferoseptal, and inferior hypokinesis. Left ventricular  ejection fraction, by estimation, is 25 to 30%. The left ventricle has  severely decreased function. The left ventricle demonstrates regional wall  motion abnormalities (see scoring   diagram/findings for description). There is mild concentric left  ventricular hypertrophy. Left ventricular diastolic parameters are  consistent with Grade I diastolic dysfunction (impaired relaxation).   2. Right ventricular systolic function is normal. The right ventricular  size is normal.   3. The mitral valve is normal in structure. No evidence of mitral valve  regurgitation. No evidence of mitral stenosis.   4. The aortic valve is tricuspid. Aortic valve regurgitation is mild. No  aortic stenosis is present.   5. The inferior vena cava is normal in size with greater than 50%  respiratory  variability, suggesting right atrial pressure of 3 mmHg.   Laboratory Data:  High Sensitivity Troponin:  No results for input(s): "TROPONINIHS" in the last 720 hours.   Chemistry Recent Labs  Lab 03/19/23 1516  NA 136  K 4.4  CL 102  CO2 24  GLUCOSE 132*  BUN 28*  CREATININE 1.76*  CALCIUM 8.7*  GFRNONAA 47*  ANIONGAP 10    Recent Labs  Lab 03/19/23 1516  PROT 6.8  ALBUMIN 3.4*  AST 25  ALT 25  ALKPHOS 111  BILITOT 1.1   Lipids No results for input(s): "CHOL", "TRIG", "HDL", "LABVLDL", "LDLCALC", "CHOLHDL" in the last 168 hours.  Hematology Recent Labs  Lab 03/19/23 1516  WBC 13.1*  RBC 4.36  HGB 12.9*  HCT 39.5  MCV 90.6  MCH 29.6  MCHC 32.7  RDW 13.0  PLT 275   Thyroid No results for input(s): "TSH", "FREET4" in the last 168 hours.  BNPNo results for input(s): "BNP", "PROBNP" in the last 168 hours.  DDimer No results for input(s): "DDIMER" in the last 168 hours.   Radiology/Studies:  DG Hip [place] W or [place] Pelvis 1 View Right  Result Date: 03/19/2023 CLINICAL  DATA:  fall EXAM: DG HIP (WITH OR WITHOUT PELVIS) 1V PORT RIGHT COMPARISON:  None Available. FINDINGS: Suboptimal exam. Mildly displaced and angulated subcapital fracture of right femoral neck noted. No other acute fracture or dislocation. No aggressive osseous lesion. Normal and symmetric sacroiliac joints. Visualized sacral arcuate lines are unremarkable. Unremarkable symphysis pubis. There are mild degenerative changes of the right hip joint without significant joint space narrowing. Osteophytosis of the superior acetabulum. No radiopaque foreign bodies. IMPRESSION: 1. Mildly displaced and angulated subcapital fracture of the right femoral neck. Electronically Signed   By: Jules Schick M.D.   On: 03/19/2023 14:52     Assessment and Plan:   1. Preop risk stratification for right hip fracture repair 2. Chronic HFrEF/ICM 3. Recent ICD implantation 02/24/23, now complicated by fall x2 4.  Essential HTN with elevated BP, has not yet had AM medications 5. H/o PVCs/VT on chronic amiodarone  6. Dyslipidemia 7. CKD 3a  RCRI is calculated at 2 indicating 10.1% 30-day risk of death, MI, or cardiac arrest. His comorbidities do place him at increased cardiovascular risk. He is rather sedentary and does not achieve >4 METS regularly but has not had any recent unstable symptoms of chest pain, dyspnea, edema, or signs of active heart failure decompensation. Needs telemetry monitoring during surgery and in the perioperative period. Regarding HF medication, he is unsure if he is taking Entresto or not as above. Continue perioperative digoxin, amiodarone, carvedilol. Home epleronone has been substituted for formulary spironolactone. Otherwise anticipate holding SGLT2i pre-operatively per anesthesia guidelines. Primary team has increased ASA to BID for the time being. I will discuss pre-op recs, integrity of ICD with recent falls, and med plan with MD.  Risk Assessment/Risk Scores:        New York Heart Association (NYHA) Functional Class NYHA Class II   For questions or updates, please contact [facility] Please consult [email or web address] for contact info under    Signed, Laurann Montana, PA-C  03/19/2023 4:56 PM  I have seen and examined the patient along with Laurann Montana, PA-C   I have reviewed the chart, notes and new data.  I agree with PA/NP's note.  Key new complaints: no recent problems with dyspnea, angina, palpitations, edema or other CV complaints. He has not required any loop diuretics in "a while". Key examination changes: clinically euvolemic (no JVD/S3/rales/edema). RRR. Small ecchymosis and tiny scab at ICD site, no swelling, tenderness or discharge.  Key new findings / data: chronically abnormal creatinine at baseline. ECG without acute changes (IVCD - "almost" LBBB)  PLAN: Despite his extensive history of CV issues, he appears optimized currently. Continue  carvedilol and amiodarone uninterrupted periop. SGLT2 inh is held for planned surgery. Hip procedure should not interfere with normal ICD function. Resume Entresto after surgery. Avoid excessive IV fluids postop. Will follow after surgery.  Thurmon Fair, MD, [facility] [phone] 03/19/2023, 6:47 PM

## 2023-03-19 NOTE — ED Triage Notes (Signed)
Pt present to ED from home d/t fall at home. Pt states left prothesis gave way, denies LOC, denies blood thinners. Pt right arm noted in sling d/t previous right collar fracture s/p fall. Pt A&Ox4 at this time. Pt received Fentanyl at 1154, second dose at 1220.

## 2023-03-19 NOTE — H&P (Addendum)
History and Physical    Adam James QMV:[id number] DOB: 26-Feb-1971 DOA: 03/19/2023  DOS: the patient was seen and examined on 03/19/2023  PCP: Hoy Register, MD   Patient coming from: Home  I have personally briefly reviewed patient's old medical records in [facility]  MW:UXLKG hip pain HPI: 52 year old Caucasian male with a history of type 2 diabetes, ischemic cardiomyopathy status post AICD in July 2024, history of left below the knee amputation, chronic systolic heart failure EF of 25%, CKD stage IIIa baseline creatinine 1.6-1.8, hyperlipidemia, coronary disease status post CABG presents to the ER today after a fall.  Patient had a fall last week and injured his right collarbone.  He fell today.  He had right hip pain.  On arrival temp 97.9 heart rate 87 blood pressure 168/86 satting 96% on room air.  X-ray showed a right femoral neck subcapital fracture with displacement and angulation.  EDP is consulted Dr. Magnus Ivan with Ortho care.  EDP informs me that Ortho will not be performing surgery today.  [facility] hospitalist consulted for admission.   ED Course: hip x-ray show right femoral neck fracture  Review of Systems:  Review of Systems  Constitutional: Negative.   HENT: Negative.    Eyes: Negative.   Respiratory: Negative.    Cardiovascular:  Negative for chest pain.  Gastrointestinal: Negative.   Genitourinary: Negative.   Musculoskeletal:  Positive for joint pain.       Right hip and right shoulder pain  Skin: Negative.   Neurological: Negative.   Endo/Heme/Allergies: Negative.   All other systems reviewed and are negative.   Past Medical History:  Diagnosis Date   Acute on chronic systolic CHF (congestive heart failure) (HCC) 11/04/2021   Anemia    GI bleed; 03/13/11 EGD: relux esophagitis with ulceration/clot   CAD (coronary artery disease)    Cardiomyopathy (HCC)    CHF (congestive heart failure) (HCC)    CKD (chronic kidney disease)    Dehiscence  of amputation stump of left lower extremity (HCC) 05/06/2022   Diabetic foot ulcer (HCC)    Dyslipidemia    Dyspnea    08/05/21- not recently.   Dysrhythmia    PVCs   Heart failure (HCC)    Hypertension    Myocardial infarction (HCC)    Necrotizing fasciitis (HCC)    NSTEMI (non-ST elevated myocardial infarction) (HCC)    Type 2 diabetes mellitus (HCC)     Past Surgical History:  Procedure Laterality Date   AMPUTATION Left 04/30/2021   Procedure: LEFT FOOT 5TH RAY AMPUTATION APPLICATION OF A-CELL POWDER;  Surgeon: [name], MD;  Location: [facility] OR;  Service: Orthopedics;  Laterality: Left;   AMPUTATION Left 07/30/2021   Procedure: LEFT BELOW KNEE AMPUTATION;  Surgeon: [name], MD;  Location: [facility] OR;  Service: Orthopedics;  Laterality: Left;   APPENDECTOMY     APPLICATION OF WOUND VAC Left 03/03/2021   Procedure: APPLICATION OF WOUND VAC;  Surgeon: Tarry Kos, MD;  Location: [facility] OR;  Service: Orthopedics;  Laterality: Left;   APPLICATION OF WOUND VAC  03/07/2021   Procedure: APPLICATION OF WOUND VAC;  Surgeon: [name], MD;  Location: [facility] OR;  Service: Orthopedics;;   APPLICATION OF WOUND VAC  07/30/2021   Procedure: APPLICATION OF WOUND VAC;  Surgeon: [name], MD;  Location: [facility] OR;  Service: Orthopedics;;   CARDIAC CATHETERIZATION     COLON SURGERY     CORONARY ARTERY BYPASS GRAFT N/A 05/22/2021   Procedure:  CORONARY ARTERY BYPASS GRAFTING (CABG) X 3 USING LEFT INTERNAL MAMMARY ARTERY AND RIGHT ENDOSCOPIC GREATER SAPHENOUS VEIN CONDUITS;  Surgeon: Corliss Skains, MD;  Location: [facility] OR;  Service: Open Heart Surgery;  Laterality: N/A;   ENDOVEIN HARVEST OF GREATER SAPHENOUS VEIN Right 05/22/2021   Procedure: ENDOVEIN HARVEST OF GREATER SAPHENOUS VEIN;  Surgeon: Corliss Skains, MD;  Location: [facility] OR;  Service: Open Heart Surgery;  Laterality: Right;   ESOPHAGOGASTRODUODENOSCOPY (EGD) WITH PROPOFOL N/A 03/12/2021   Procedure: ESOPHAGOGASTRODUODENOSCOPY (EGD) WITH  PROPOFOL;  Surgeon: Jenel Lucks, MD;  Location: [facility] ENDOSCOPY;  Service: Gastroenterology;  Laterality: N/A;   I & D EXTREMITY Left 02/24/2021   Procedure: IRRIGATION AND DEBRIDEMENT EXTREMITY;  Surgeon: Myrene Galas, MD;  Location: [facility] OR;  Service: Orthopedics;  Laterality: Left;   I & D EXTREMITY Left 02/26/2021   Procedure: IRRIGATION AND DEBRIDEMENT OF LEG;  Surgeon: [name], MD;  Location: [facility] OR;  Service: Orthopedics;  Laterality: Left;   I & D EXTREMITY Left 03/03/2021   Procedure: LEFT LEG IRRIGATION AND DEBRIDEMENT;  Surgeon: Tarry Kos, MD;  Location: [facility] OR;  Service: Orthopedics;  Laterality: Left;   I & D EXTREMITY Left 03/07/2021   Procedure: REPEAT DEBRIDEMENT LEFT LEG;  Surgeon: [name], MD;  Location: [facility] OR;  Service: Orthopedics;  Laterality: Left;   ICD IMPLANT N/A 02/24/2023   Procedure: ICD IMPLANT;  Surgeon: Duke Salvia, MD;  Location: [facility] INVASIVE CV LAB;  Service: Cardiovascular;  Laterality: N/A;   IR FLUORO GUIDE CV LINE RIGHT  03/10/2021   IR REMOVAL TUN CV CATH W/O FL  03/24/2021   IR US GUIDE VASC ACCESS RIGHT  03/10/2021   LEFT HEART CATH AND CORONARY ANGIOGRAPHY N/A 09/13/2020   Procedure: LEFT HEART CATH AND CORONARY ANGIOGRAPHY;  Surgeon: Lennette Bihari, MD;  Location: [facility] INVASIVE CV LAB;  Service: Cardiovascular;  Laterality: N/A;   LEFT HEART CATH AND CORONARY ANGIOGRAPHY N/A 03/28/2021   Procedure: LEFT HEART CATH AND CORONARY ANGIOGRAPHY;  Surgeon: Laurey Morale, MD;  Location: [facility] INVASIVE CV LAB;  Service: Cardiovascular;  Laterality: N/A;   RIGHT HEART CATH N/A 03/26/2021   Procedure: RIGHT HEART CATH;  Surgeon: Laurey Morale, MD;  Location: [facility] INVASIVE CV LAB;  Service: Cardiovascular;  Laterality: N/A;   STUMP REVISION Left 05/06/2022   Procedure: REVISION LEFT BELOW KNEE AMPUTATION;  Surgeon: [name], MD;  Location: [facility] OR;  Service: Orthopedics;  Laterality: Left;   TEE WITHOUT CARDIOVERSION N/A 05/22/2021   Procedure:  TRANSESOPHAGEAL ECHOCARDIOGRAM (TEE);  Surgeon: Corliss Skains, MD;  Location: [facility] OR;  Service: Open Heart Surgery;  Laterality: N/A;   TOE AMPUTATION  2020     reports that he quit smoking about 2 years ago. His smoking use included cigarettes. He started smoking about 38 years ago. He has a 108 pack-year smoking history. He has never used smokeless tobacco. He reports that he does not currently use alcohol. He reports that he does not currently use drugs after having used the following drugs: Marijuana.  Allergies  Allergen Reactions   Morphine Itching    Itching under skin    Family History  Problem Relation Age of Onset   Hypertension Mother    Heart failure Father    Hypertension Brother    Congestive Heart Failure Brother     Prior to Admission medications   Medication Sig Start Date End Date Taking? Authorizing Provider  amiodarone (PACERONE) 200 MG tablet  TAKE 1/2 TABLET(100 MG) BY MOUTH DAILY 03/19/23  Yes Bensimhon, Bevelyn Buckles, MD  aspirin EC 81 MG tablet Take 1 tablet (81 mg total) by mouth daily. 08/19/22  Yes Milford, Anderson Malta, FNP  atorvastatin (LIPITOR) 80 MG tablet Take 80 mg by mouth daily.   Yes [provider]  carvedilol (COREG) 3.125 MG tablet Take 1 tablet (3.125 mg total) by mouth 2 (two) times daily. 08/19/22  Yes Milford, Anderson Malta, FNP  cephALEXin (KEFLEX) 500 MG capsule Take 1 capsule (500 mg total) by mouth 2 (two) times daily. 03/10/23  Yes Marinus Maw, MD  digoxin (LANOXIN) 0.125 MG tablet TAKE 1 TABLET(0.125 MG) BY MOUTH DAILY 01/14/23  Yes Milford, Anderson Malta, FNP  empagliflozin (JARDIANCE) 10 MG TABS tablet Take 1 tablet (10 mg total) by mouth daily. Please make appointment with Dr. Alvis Lemmings for more refills. 01/14/23  Yes Hoy Register, MD  eplerenone (INSPRA) 25 MG tablet TAKE 1 TABLET(25 MG) BY MOUTH DAILY 02/08/23  Yes Goshen, Henry, FNP  gabapentin (NEURONTIN) 300 MG capsule TAKE 1 CAPSULE(300 MG) BY MOUTH AT BEDTIME 03/17/23  Yes  Marcine Matar, MD  glipiZIDE (GLUCOTROL) 5 MG tablet Take 1 tablet (5 mg total) by mouth daily before breakfast. 08/31/22  Yes McClung, Angela M, PA-C  oxyCODONE (OXY IR/ROXICODONE) 5 MG immediate release tablet Take 1 tablet (5 mg total) by mouth every 4 (four) hours as needed for severe pain. 03/16/23  Yes [name], MD  rosuvastatin (CRESTOR) 20 MG tablet Take 1 tablet (20 mg total) by mouth daily. 12/04/22  Yes Newlin, Odette Horns, MD  Semaglutide, 1 MG/DOSE, 4 MG/3ML SOPN Inject 1 mg as directed once a week. 12/02/22  Yes Hoy Register, MD  venlafaxine XR (EFFEXOR-XR) 75 MG 24 hr capsule TAKE 1 CAPSULE(75 MG) BY MOUTH DAILY WITH BREAKFAST 03/16/23  Yes Newlin, Enobong, MD  Blood Pressure Monitoring (BLOOD PRESSURE CUFF) MISC 1 each by Does not apply route as directed. 08/31/22   Anders Simmonds, PA-C  glucose blood (TRUE METRIX BLOOD GLUCOSE TEST) test strip Use as instructed twice daily 05/12/21   Mayers, Cari S, PA-C  Semaglutide,0.25 or 0.5MG /DOS, (OZEMPIC, 0.25 OR 0.5 MG/DOSE,) 2 MG/3ML SOPN Inject 0.5 mg into the skin once a week. For 4 weeks then increase to 1mg  Patient not taking: Reported on 03/19/2023 12/02/22   Hoy Register, MD  TRUEplus Lancets 28G MISC Use as directed twice daily at 8 am and 10 pm. 05/12/21   Roney Jaffe, PA-C    Physical Exam: Vitals:   03/19/23 1415 03/19/23 1430 03/19/23 1445 03/19/23 1500  BP: (!) 165/83 (!) 162/84 (!) 164/84 (!) 167/87  Pulse: 88 89 89 91  Resp: 10 16 14 14   Temp:      TempSrc:      SpO2: 96% 98% 97% 98%  Weight:      Height:        Physical Exam Vitals and nursing note reviewed.  Constitutional:      General: He is not in acute distress.    Appearance: He is obese. He is not toxic-appearing.  HENT:     Head: Normocephalic and atraumatic.     Nose: Nose normal.  Eyes:     General: No scleral icterus. Cardiovascular:     Rate and Rhythm: Normal rate and regular rhythm.  Pulmonary:     Effort: Pulmonary effort is normal.      Breath sounds: Normal breath sounds.  Abdominal:     General:  Abdomen is protuberant. Bowel sounds are normal. There is no distension.     Palpations: Abdomen is soft.     Tenderness: There is no abdominal tenderness.  Musculoskeletal:     Comments: Left BKA  Skin:    Capillary Refill: Capillary refill takes less than 2 seconds.     Comments: Bruising in various stages of healing around his right anterior chest wall  Neurological:     General: No focal deficit present.     Mental Status: He is alert and oriented to person, place, and time.      Labs on Admission: I have personally reviewed following labs and imaging studies  CBC and BMP, digoxin level pending     Radiological Exams on Admission: I have personally reviewed images DG Hip [place] or [place] Pelvis 1 View Right  Result Date: 03/19/2023 CLINICAL DATA:  fall EXAM: DG HIP (WITH OR WITHOUT PELVIS) 1V PORT RIGHT COMPARISON:  None Available. FINDINGS: Suboptimal exam. Mildly displaced and angulated subcapital fracture of right femoral neck noted. No other acute fracture or dislocation. No aggressive osseous lesion. Normal and symmetric sacroiliac joints. Visualized sacral arcuate lines are unremarkable. Unremarkable symphysis pubis. There are mild degenerative changes of the right hip joint without significant joint space narrowing. Osteophytosis of the superior acetabulum. No radiopaque foreign bodies. IMPRESSION: 1. Mildly displaced and angulated subcapital fracture of the right femoral neck. Electronically Signed   By: Jules Schick M.D.   On: 03/19/2023 14:52    EKG: My personal interpretation of EKG shows: NSR    Assessment/Plan Principal Problem:   Fracture of femoral neck, right, closed (HCC) Active Problems:   Ischemic cardiomyopathy with implantable cardioverter-defibrillator (ICD)   Type 2 diabetes mellitus (HCC)   Dyslipidemia   Chronic combined systolic (congestive) and diastolic (congestive) heart  failure (HCC) - LVEF 25%   S/P CABG x 3   Below-knee amputation of left lower extremity (HCC)   S/P ICD (internal cardiac defibrillator) procedure - [employer] GALLANT VR Serial [id number]. MRI Compatible   CKD stage 3a, GFR 45-59 ml/min (HCC) - baseline SCr 1.6-1.8    Assessment and Plan: * Fracture of femoral neck, right, closed (HCC) Admit to med/tele bed. EDP has consulted orthopedics([facility] with [facility]) who will see in consult. EDP says pt will not be getting surgery today. Will make NPO after MN. Will ask cards for pre-op eval. Pain control with po dilaudid.  CKD stage 3a, GFR 45-59 ml/min (HCC) - baseline SCr 1.6-1.8 Stable.  S/P ICD (internal cardiac defibrillator) procedure - [employer] GALLANT VR Serial [id number]. MRI Compatible Stable. Had AICD placed 02-24-2023.  Below-knee amputation of left lower extremity (HCC) Chronic. Pt has left LE prosthesis.  S/P CABG x 3 Stable.  Chronic combined systolic (congestive) and diastolic (congestive) heart failure (HCC) - LVEF 25% Stable. Euvolemic.  Dyslipidemia Continue crestor.  Type 2 diabetes mellitus (HCC) Add SSI. Continue home DM meds.  Ischemic cardiomyopathy with implantable cardioverter-defibrillator (ICD) Stable. No chest pain.   DVT prophylaxis:  ASA 81 mg bid Code Status: Full Code Family Communication: no family at bedside  Disposition Plan: home with [facility] vs SNF  Consults called: EDP has consulted [facility])  Admission status: Inpatient, Telemetry bed   Carollee Herter, DO [facility] 03/19/2023, 4:03 PM

## 2023-03-19 NOTE — Assessment & Plan Note (Signed)
Chronic. Pt has left LE prosthesis.

## 2023-03-19 NOTE — ED Provider Notes (Signed)
[place] EMERGENCY DEPARTMENT AT [facility] Provider Note   CSN: [id number] Arrival date & time: 03/19/23  1230     History  Chief Complaint  Patient presents with   Ferdinando Traino is a 52 y.o. male.  Patient complains of pain in his right hip after falling.  Patient reports that his left knee gave way.  Patient has a left below the knee amputation.  He reports he had a recent fall and injured his left knee stump and fractured his right clavicle.  Patient saw Dr. Lajoyce Corners on 8/20.  Patient reports he landed on his right hip today.  He complains of severe pain in his right hip.  Patient reports he did not hit his head he did not lose consciousness.  He denies any other areas of injury.  Patient has not been able to stand since the fall.  Patient recently had a defibrillator placed and is followed by Dr. Graciela Husbands  The history is provided by the patient. No language interpreter was used.  Fall This is a new problem. The problem occurs constantly. The problem has not changed since onset.Pertinent negatives include no headaches. Nothing aggravates the symptoms. Nothing relieves the symptoms. He has tried nothing for the symptoms.       Home Medications Prior to Admission medications   Medication Sig Start Date End Date Taking? Authorizing Provider  amiodarone (PACERONE) 200 MG tablet TAKE 1/2 TABLET(100 MG) BY MOUTH DAILY 03/19/23  Yes Bensimhon, Bevelyn Buckles, MD  aspirin EC 81 MG tablet Take 1 tablet (81 mg total) by mouth daily. 08/19/22  Yes Milford, Anderson Malta, FNP  atorvastatin (LIPITOR) 80 MG tablet Take 80 mg by mouth daily.   Yes [provider]  carvedilol (COREG) 3.125 MG tablet Take 1 tablet (3.125 mg total) by mouth 2 (two) times daily. 08/19/22  Yes Milford, Anderson Malta, FNP  cephALEXin (KEFLEX) 500 MG capsule Take 1 capsule (500 mg total) by mouth 2 (two) times daily. 03/10/23  Yes Marinus Maw, MD  digoxin (LANOXIN) 0.125 MG tablet TAKE 1 TABLET(0.125 MG)  BY MOUTH DAILY 01/14/23  Yes Milford, Anderson Malta, FNP  empagliflozin (JARDIANCE) 10 MG TABS tablet Take 1 tablet (10 mg total) by mouth daily. Please make appointment with Dr. Alvis Lemmings for more refills. 01/14/23  Yes Hoy Register, MD  eplerenone (INSPRA) 25 MG tablet TAKE 1 TABLET(25 MG) BY MOUTH DAILY 02/08/23  Yes Quinlan, Blasdell, FNP  gabapentin (NEURONTIN) 300 MG capsule TAKE 1 CAPSULE(300 MG) BY MOUTH AT BEDTIME 03/17/23  Yes Marcine Matar, MD  glipiZIDE (GLUCOTROL) 5 MG tablet Take 1 tablet (5 mg total) by mouth daily before breakfast. 08/31/22  Yes McClung, Angela M, PA-C  oxyCODONE (OXY IR/ROXICODONE) 5 MG immediate release tablet Take 1 tablet (5 mg total) by mouth every 4 (four) hours as needed for severe pain. 03/16/23  Yes [name], MD  rosuvastatin (CRESTOR) 20 MG tablet Take 1 tablet (20 mg total) by mouth daily. 12/04/22  Yes Newlin, Odette Horns, MD  Semaglutide, 1 MG/DOSE, 4 MG/3ML SOPN Inject 1 mg as directed once a week. 12/02/22  Yes Hoy Register, MD  venlafaxine XR (EFFEXOR-XR) 75 MG 24 hr capsule TAKE 1 CAPSULE(75 MG) BY MOUTH DAILY WITH BREAKFAST 03/16/23  Yes Newlin, Enobong, MD  Blood Pressure Monitoring (BLOOD PRESSURE CUFF) MISC 1 each by Does not apply route as directed. 08/31/22   Anders Simmonds, PA-C  glucose blood (TRUE METRIX BLOOD GLUCOSE TEST) test strip  Use as instructed twice daily 05/12/21   Mayers, Cari S, PA-C  Semaglutide,0.25 or 0.5MG /DOS, (OZEMPIC, 0.25 OR 0.5 MG/DOSE,) 2 MG/3ML SOPN Inject 0.5 mg into the skin once a week. For 4 weeks then increase to 1mg  Patient not taking: Reported on 03/19/2023 12/02/22   Hoy Register, MD  TRUEplus Lancets 28G MISC Use as directed twice daily at 8 am and 10 pm. 05/12/21   Mayers, Cari S, PA-C      Allergies    Morphine    Review of Systems   Review of Systems  Neurological:  Negative for headaches.  All other systems reviewed and are negative.   Physical Exam Updated Vital Signs BP (!) 164/84   Pulse 89    Temp 97.9 F (36.6 C) (Oral)   Resp 14   Ht 6\' 3"  (1.905 m)   Wt 99 kg   SpO2 97%   BMI 27.28 kg/m  Physical Exam Vitals and nursing note reviewed.  Constitutional:      Appearance: He is well-developed.  HENT:     Head: Normocephalic.     Mouth/Throat:     Mouth: Mucous membranes are moist.  Cardiovascular:     Rate and Rhythm: Normal rate.  Pulmonary:     Effort: Pulmonary effort is normal.  Abdominal:     General: There is no distension.  Musculoskeletal:     Cervical back: Normal range of motion.     Comments: Left below the knee amputation Under right hip pain to palpation pain with any range of motion, neurovascular neurosensory intact  Skin:    General: Skin is warm.  Neurological:     General: No focal deficit present.     Mental Status: He is alert and oriented to person, place, and time.     ED Results / Procedures / Treatments   Labs (all labs ordered are listed, but only abnormal results are displayed) Labs Reviewed  CBC WITH DIFFERENTIAL/PLATELET  COMPREHENSIVE METABOLIC PANEL    EKG None  Radiology DG Hip [place] or [place] Pelvis 1 View Right  Result Date: 03/19/2023 CLINICAL DATA:  fall EXAM: DG HIP (WITH OR WITHOUT PELVIS) 1V PORT RIGHT COMPARISON:  None Available. FINDINGS: Suboptimal exam. Mildly displaced and angulated subcapital fracture of right femoral neck noted. No other acute fracture or dislocation. No aggressive osseous lesion. Normal and symmetric sacroiliac joints. Visualized sacral arcuate lines are unremarkable. Unremarkable symphysis pubis. There are mild degenerative changes of the right hip joint without significant joint space narrowing. Osteophytosis of the superior acetabulum. No radiopaque foreign bodies. IMPRESSION: 1. Mildly displaced and angulated subcapital fracture of the right femoral neck. Electronically Signed   By: Jules Schick M.D.   On: 03/19/2023 14:52    Procedures Procedures    Medications Ordered in  ED Medications  fentaNYL (SUBLIMAZE) injection 75 mcg (75 mcg Intravenous Given 03/19/23 1331)  HYDROmorphone (DILAUDID) injection 1 mg (1 mg Intravenous Given 03/19/23 1516)    ED Course/ Medical Decision Making/ A&P                                 Medical Decision Making Patient reports he fell today and struck his right hip.  Amount and/or Complexity of Data Reviewed Independent Historian:     Details: Is here with family who is supportive External Data Reviewed: notes.    Details: Dr. Audrie Lia office notes reviewed I discussed with Dr. Claudie Fisherman hospitalist  who will see for admission Labs: ordered. Decision-making details documented in ED Course.    Details: Labs ordered reviewed and interpreted Radiology: ordered and independent interpretation performed. Decision-making details documented in ED Course.    Details: X-ray right hip shows mildly displaced and angulated subcapital femoral neck fracture ECG/medicine tests: ordered and independent interpretation performed. Discussion of management or test interpretation with external provider(s): I spoke with Dr. Magnus Ivan orthopedist on-call for Dr. Lajoyce Corners.  He advised admit to medicine service.  He states they will evaluate in a.m. for surgery.  Patient will not have surgery today Unassigned medicine consulted for admission  Risk Prescription drug management. Risk Details: Patient given IV pain medication and           Final Clinical Impression(s) / ED Diagnoses Final diagnoses:  Closed fracture of right hip, initial encounter [facility])    Rx / DC Orders ED Discharge Orders     None         Osie Cheeks 03/19/23 1543    Margarita Grizzle, MD 03/19/23 1701

## 2023-03-19 NOTE — Subjective & Objective (Addendum)
ZO:XWRUE hip pain HPI: 53 year old Caucasian male with a history of type 2 diabetes, ischemic cardiomyopathy status post AICD in July 2024, history of left below the knee amputation, chronic systolic heart failure EF of 25%, CKD stage IIIa baseline creatinine 1.6-1.8, hyperlipidemia, coronary disease status post CABG presents to the ER today after a fall.  Patient had a fall last week and injured his right collarbone.  He fell today.  He had right hip pain.  On arrival temp 97.9 heart rate 87 blood pressure 168/86 satting 96% on room air.  X-ray showed a right femoral neck subcapital fracture with displacement and angulation.  EDP is consulted Dr. Magnus Ivan with Ortho care.  EDP informs me that Ortho will not be performing surgery today.  [facility] hospitalist consulted for admission.

## 2023-03-19 NOTE — Assessment & Plan Note (Addendum)
Admit to med/tele bed. EDP has consulted orthopedics([facility] with [facility]) who will see in consult. EDP says pt will not be getting surgery today. Will make NPO after MN. Will ask cards for pre-op eval. Pain control with po dilaudid.

## 2023-03-19 NOTE — Assessment & Plan Note (Signed)
Stable

## 2023-03-19 NOTE — Assessment & Plan Note (Signed)
Continue crestor 

## 2023-03-19 NOTE — Progress Notes (Signed)
Per review with Dr. Royann Shivers, no need to interrogate device at this time since patient was actually interrogated the day that he fell and injured his collarbone, and present telemetry not indicative of any undersensing or inappropriate impulses.

## 2023-03-19 NOTE — ED Notes (Signed)
Called report to charge nurse on 5N

## 2023-03-19 NOTE — Assessment & Plan Note (Signed)
Stable. Had AICD placed 02-24-2023.

## 2023-03-19 NOTE — ED Notes (Signed)
ED TO INPATIENT HANDOFF REPORT  ED Nurse Name and Phone #: Theophilus Bones [phone]  S Name/Age/Gender Adam James 52 y.o. male Room/Bed: 002C/002C  Code Status   Code Status: Full Code  Home/SNF/Other Home Patient oriented to: self, place, time, and situation Is this baseline? Yes   Triage Complete: Triage complete  Chief Complaint Fracture of femoral neck, right, closed (HCC) [S72.001A]  Triage Note Pt present to ED from home d/t fall at home. Pt states left prothesis gave way, denies LOC, denies blood thinners. Pt right arm noted in sling d/t previous right collar fracture s/p fall. Pt A&Ox4 at this time. Pt received Fentanyl at 1154, second dose at 1220.   Allergies Allergies  Allergen Reactions   Morphine Itching    Itching under skin    Level of Care/Admitting Diagnosis ED Disposition     ED Disposition  Admit   Condition  --   Comment  Hospital Area: [facility] [[id number]]  Level of Care: Telemetry Medical [[id number]]  May admit patient to [facility] or [facility] if equivalent level of care is available:: No  Covid Evaluation: Asymptomatic - no recent exposure (last 10 days) testing not required  Diagnosis: Fracture of femoral neck, right, closed [facility]) [[id number]]  Admitting Physician: Imogene Burn ERIC [[id number]]  Attending Physician: Imogene Burn, ERIC [[id number]]  Certification:: I certify this patient will need inpatient services for at least 2 midnights  Expected Medical Readiness: 03/23/2023          B Medical/Surgery History Past Medical History:  Diagnosis Date   Acute on chronic systolic CHF (congestive heart failure) (HCC) 11/04/2021   Anemia    GI bleed; 03/13/11 EGD: relux esophagitis with ulceration/clot   CAD (coronary artery disease)    Cardiomyopathy (HCC)    CHF (congestive heart failure) (HCC)    CKD (chronic kidney disease)    Dehiscence of amputation stump of left lower extremity (HCC) 05/06/2022   Diabetic foot ulcer (HCC)     Dyslipidemia    Dyspnea    08/05/21- not recently.   Dysrhythmia    PVCs   Heart failure (HCC)    Hypertension    Myocardial infarction (HCC)    Necrotizing fasciitis (HCC)    NSTEMI (non-ST elevated myocardial infarction) (HCC)    Type 2 diabetes mellitus (HCC)    Past Surgical History:  Procedure Laterality Date   AMPUTATION Left 04/30/2021   Procedure: LEFT FOOT 5TH RAY AMPUTATION APPLICATION OF A-CELL POWDER;  Surgeon: [name], MD;  [facility] OR;  Service: Orthopedics;  Laterality: Left;   AMPUTATION Left 07/30/2021   Procedure: LEFT BELOW KNEE AMPUTATION;  Surgeon: [name], MD;  Location: [facility] OR;  Service: Orthopedics;  Laterality: Left;   APPENDECTOMY     APPLICATION OF WOUND VAC Left 03/03/2021   Procedure: APPLICATION OF WOUND VAC;  Surgeon: Tarry Kos, MD;  Location: [facility] OR;  Service: Orthopedics;  Laterality: Left;   APPLICATION OF WOUND VAC  03/07/2021   Procedure: APPLICATION OF WOUND VAC;  Surgeon: [name], MD;  Location: [facility] OR;  Service: Orthopedics;;   APPLICATION OF WOUND VAC  07/30/2021   Procedure: APPLICATION OF WOUND VAC;  Surgeon: [name], MD;  Location: [facility] OR;  Service: Orthopedics;;   CARDIAC CATHETERIZATION     COLON SURGERY     CORONARY ARTERY BYPASS GRAFT N/A 05/22/2021   Procedure: CORONARY ARTERY BYPASS GRAFTING (CABG) X 3 USING LEFT INTERNAL MAMMARY ARTERY AND RIGHT ENDOSCOPIC  GREATER SAPHENOUS VEIN CONDUITS;  Surgeon: Corliss Skains, MD;  Location: [facility] OR;  Service: Open Heart Surgery;  Laterality: N/A;   ENDOVEIN HARVEST OF GREATER SAPHENOUS VEIN Right 05/22/2021   Procedure: ENDOVEIN HARVEST OF GREATER SAPHENOUS VEIN;  Surgeon: Corliss Skains, MD;  Location: [facility] OR;  Service: Open Heart Surgery;  Laterality: Right;   ESOPHAGOGASTRODUODENOSCOPY (EGD) WITH PROPOFOL N/A 03/12/2021   Procedure: ESOPHAGOGASTRODUODENOSCOPY (EGD) WITH PROPOFOL;  Surgeon: Jenel Lucks, MD;  Location: [facility] ENDOSCOPY;  Service:  Gastroenterology;  Laterality: N/A;   I & D EXTREMITY Left 02/24/2021   Procedure: IRRIGATION AND DEBRIDEMENT EXTREMITY;  Surgeon: Myrene Galas, MD;  Location: [facility] OR;  Service: Orthopedics;  Laterality: Left;   I & D EXTREMITY Left 02/26/2021   Procedure: IRRIGATION AND DEBRIDEMENT OF LEG;  Surgeon: [name], MD;  Location: [facility] OR;  Service: Orthopedics;  Laterality: Left;   I & D EXTREMITY Left 03/03/2021   Procedure: LEFT LEG IRRIGATION AND DEBRIDEMENT;  Surgeon: Tarry Kos, MD;  Location: [facility] OR;  Service: Orthopedics;  Laterality: Left;   I & D EXTREMITY Left 03/07/2021   Procedure: REPEAT DEBRIDEMENT LEFT LEG;  Surgeon: [name], MD;  Location: [facility] OR;  Service: Orthopedics;  Laterality: Left;   ICD IMPLANT N/A 02/24/2023   Procedure: ICD IMPLANT;  Surgeon: Duke Salvia, MD;  Location: [facility] INVASIVE CV LAB;  Service: Cardiovascular;  Laterality: N/A;   IR FLUORO GUIDE CV LINE RIGHT  03/10/2021   IR REMOVAL TUN CV CATH W/O FL  03/24/2021   IR US GUIDE VASC ACCESS RIGHT  03/10/2021   LEFT HEART CATH AND CORONARY ANGIOGRAPHY N/A 09/13/2020   Procedure: LEFT HEART CATH AND CORONARY ANGIOGRAPHY;  Surgeon: Lennette Bihari, MD;  Location: [facility] INVASIVE CV LAB;  Service: Cardiovascular;  Laterality: N/A;   LEFT HEART CATH AND CORONARY ANGIOGRAPHY N/A 03/28/2021   Procedure: LEFT HEART CATH AND CORONARY ANGIOGRAPHY;  Surgeon: Laurey Morale, MD;  Location: [facility] INVASIVE CV LAB;  Service: Cardiovascular;  Laterality: N/A;   RIGHT HEART CATH N/A 03/26/2021   Procedure: RIGHT HEART CATH;  Surgeon: Laurey Morale, MD;  Location: [facility] INVASIVE CV LAB;  Service: Cardiovascular;  Laterality: N/A;   STUMP REVISION Left 05/06/2022   Procedure: REVISION LEFT BELOW KNEE AMPUTATION;  Surgeon: [name], MD;  Location: [facility] OR;  Service: Orthopedics;  Laterality: Left;   TEE WITHOUT CARDIOVERSION N/A 05/22/2021   Procedure: TRANSESOPHAGEAL ECHOCARDIOGRAM (TEE);  Surgeon: Corliss Skains, MD;  Location:  [facility] OR;  Service: Open Heart Surgery;  Laterality: N/A;   TOE AMPUTATION  2020     A IV Location/Drains/Wounds Patient Lines/Drains/Airways Status     Active Line/Drains/Airways     Name Placement date Placement time Site Days   Peripheral IV 03/19/23 18 G 1" Anterior;Distal;Left;Upper Arm 03/19/23  1302  Arm  less than 1   Negative Pressure Wound Therapy Pretibial Left;Circumferential 07/30/21  1313  --  597   Negative Pressure Wound Therapy Leg Left;Circumferential 05/06/22  1013  --  317   Wound / Incision (Open or Dehisced) 02/25/21 Diabetic ulcer Foot Right 02/25/21  0130  Foot  752   Wound / Incision (Open or Dehisced) 04/29/21 Pretibial Left 04/29/21  1700  Pretibial  689   Wound / Incision (Open or Dehisced) 04/29/21 Foot Left;Posterior;Proximal;Distal 04/29/21  1500  Foot  689            Intake/Output Last 24 hours No intake or output  data in the 24 hours ending 03/19/23 1614  Labs/Imaging Results for orders placed or performed during the hospital encounter of 03/19/23 (from the past 48 hour(s))  CBC with Differential     Status: Abnormal   Collection Time: 03/19/23  3:16 PM  Result Value Ref Range   WBC 13.1 (H) 4.0 - 10.5 K/uL   RBC 4.36 4.22 - 5.81 MIL/uL   Hemoglobin 12.9 (L) 13.0 - 17.0 g/dL   HCT 13.2 44.0 - 10.2 %   MCV 90.6 80.0 - 100.0 fL   MCH 29.6 26.0 - 34.0 pg   MCHC 32.7 30.0 - 36.0 g/dL   RDW 72.5 36.6 - 44.0 %   Platelets 275 150 - 400 K/uL   nRBC 0.0 0.0 - 0.2 %   Neutrophils Relative % 76 %   Neutro Abs 10.0 (H) 1.7 - 7.7 K/uL   Lymphocytes Relative 17 %   Lymphs Abs 2.3 0.7 - 4.0 K/uL   Monocytes Relative 5 %   Monocytes Absolute 0.6 0.1 - 1.0 K/uL   Eosinophils Relative 1 %   Eosinophils Absolute 0.2 0.0 - 0.5 K/uL   Basophils Relative 1 %   Basophils Absolute 0.1 0.0 - 0.1 K/uL   Immature Granulocytes 0 %   Abs Immature Granulocytes 0.04 0.00 - 0.07 K/uL    Comment: Performed at [facility], [address] [place], [place]  [address]   DG Hip [place] or [place] Pelvis 1 View Right  Result Date: 03/19/2023 CLINICAL DATA:  fall EXAM: DG HIP (WITH OR WITHOUT PELVIS) 1V PORT RIGHT COMPARISON:  None Available. FINDINGS: Suboptimal exam. Mildly displaced and angulated subcapital fracture of right femoral neck noted. No other acute fracture or dislocation. No aggressive osseous lesion. Normal and symmetric sacroiliac joints. Visualized sacral arcuate lines are unremarkable. Unremarkable symphysis pubis. There are mild degenerative changes of the right hip joint without significant joint space narrowing. Osteophytosis of the superior acetabulum. No radiopaque foreign bodies. IMPRESSION: 1. Mildly displaced and angulated subcapital fracture of the right femoral neck. Electronically Signed   By: Jules Schick M.D.   On: 03/19/2023 14:52    Pending Labs Unresulted Labs (From admission, onward)     Start     Ordered   03/19/23 1529  Digoxin level  Once,   STAT        03/19/23 1528   03/19/23 1453  Comprehensive metabolic panel  Once,   STAT        03/19/23 1452   Signed and Held  HIV Antibody (routine testing w rflx)  (HIV Antibody (Routine testing w reflex) panel)  Tomorrow morning,   R        Signed and Held            Vitals/Pain Today's Vitals   03/19/23 1415 03/19/23 1430 03/19/23 1445 03/19/23 1500  BP: (!) 165/83 (!) 162/84 (!) 164/84 (!) 167/87  Pulse: 88 89 89 91  Resp: 10 16 14 14   Temp:      TempSrc:      SpO2: 96% 98% 97% 98%  Weight:      Height:      PainSc:        Isolation Precautions No active isolations  Medications Medications  fentaNYL (SUBLIMAZE) injection 75 mcg (75 mcg Intravenous Given 03/19/23 1331)  HYDROmorphone (DILAUDID) injection 1 mg (1 mg Intravenous Given 03/19/23 1516)  HYDROmorphone (DILAUDID) injection 1 mg (1 mg Intravenous Given 03/19/23 1547)    Mobility Ambulates  with prothesis to left leg     Focused Assessments Musculoskeletal   R Recommendations: See  Admitting Provider Note  Report given to:   Additional Notes:

## 2023-03-20 ENCOUNTER — Encounter (HOSPITAL_COMMUNITY): Payer: Self-pay | Admitting: Internal Medicine

## 2023-03-20 ENCOUNTER — Inpatient Hospital Stay (HOSPITAL_COMMUNITY): Payer: Medicaid Other | Admitting: Anesthesiology

## 2023-03-20 ENCOUNTER — Other Ambulatory Visit: Payer: Self-pay

## 2023-03-20 ENCOUNTER — Inpatient Hospital Stay (HOSPITAL_COMMUNITY): Payer: Medicaid Other

## 2023-03-20 ENCOUNTER — Encounter (HOSPITAL_COMMUNITY)
Admission: EM | Disposition: A | Discharge status: Home Health | Payer: Self-pay | Source: Home / Self Care | Attending: Internal Medicine

## 2023-03-20 DIAGNOSIS — Z95810 Presence of automatic (implantable) cardiac defibrillator: Secondary | ICD-10-CM | POA: Diagnosis not present

## 2023-03-20 DIAGNOSIS — Z01810 Encounter for preprocedural cardiovascular examination: Secondary | ICD-10-CM | POA: Diagnosis not present

## 2023-03-20 DIAGNOSIS — N1831 Chronic kidney disease, stage 3a: Secondary | ICD-10-CM

## 2023-03-20 DIAGNOSIS — S72041A Displaced fracture of base of neck of right femur, initial encounter for closed fracture: Secondary | ICD-10-CM | POA: Diagnosis not present

## 2023-03-20 DIAGNOSIS — Z89512 Acquired absence of left leg below knee: Secondary | ICD-10-CM | POA: Diagnosis not present

## 2023-03-20 DIAGNOSIS — Z96641 Presence of right artificial hip joint: Secondary | ICD-10-CM | POA: Diagnosis not present

## 2023-03-20 DIAGNOSIS — E1159 Type 2 diabetes mellitus with other circulatory complications: Secondary | ICD-10-CM | POA: Diagnosis not present

## 2023-03-20 DIAGNOSIS — S72011A Unspecified intracapsular fracture of right femur, initial encounter for closed fracture: Secondary | ICD-10-CM | POA: Diagnosis not present

## 2023-03-20 DIAGNOSIS — I130 Hypertensive heart and chronic kidney disease with heart failure and stage 1 through stage 4 chronic kidney disease, or unspecified chronic kidney disease: Secondary | ICD-10-CM | POA: Diagnosis not present

## 2023-03-20 DIAGNOSIS — I2510 Atherosclerotic heart disease of native coronary artery without angina pectoris: Secondary | ICD-10-CM | POA: Diagnosis not present

## 2023-03-20 DIAGNOSIS — I255 Ischemic cardiomyopathy: Secondary | ICD-10-CM | POA: Diagnosis not present

## 2023-03-20 DIAGNOSIS — S72001A Fracture of unspecified part of neck of right femur, initial encounter for closed fracture: Secondary | ICD-10-CM | POA: Diagnosis not present

## 2023-03-20 DIAGNOSIS — I5042 Chronic combined systolic (congestive) and diastolic (congestive) heart failure: Secondary | ICD-10-CM | POA: Diagnosis not present

## 2023-03-20 DIAGNOSIS — S88112S Complete traumatic amputation at level between knee and ankle, left lower leg, sequela: Secondary | ICD-10-CM | POA: Diagnosis not present

## 2023-03-20 DIAGNOSIS — Z951 Presence of aortocoronary bypass graft: Secondary | ICD-10-CM | POA: Diagnosis not present

## 2023-03-20 HISTORY — PX: TOTAL HIP ARTHROPLASTY: SHX124

## 2023-03-20 LAB — CBC
HCT: 39.5 % (ref 39.0–52.0)
HCT: 38.5 % — ABNORMAL LOW (ref 39.0–52.0)
Hemoglobin: 12.9 g/dL — ABNORMAL LOW (ref 13.0–17.0)
Hemoglobin: 12.6 g/dL — ABNORMAL LOW (ref 13.0–17.0)
MCH: 30 pg (ref 26.0–34.0)
MCH: 30.3 pg (ref 26.0–34.0)
MCHC: 32.7 g/dL (ref 30.0–36.0)
MCHC: 32.7 g/dL (ref 30.0–36.0)
MCV: 91.9 fL (ref 80.0–100.0)
MCV: 92.5 fL (ref 80.0–100.0)
Platelets: 254 10*3/uL (ref 150–400)
Platelets: 240 10*3/uL (ref 150–400)
RBC: 4.3 MIL/uL (ref 4.22–5.81)
RBC: 4.16 MIL/uL — ABNORMAL LOW (ref 4.22–5.81)
RDW: 13.2 % (ref 11.5–15.5)
RDW: 13.1 % (ref 11.5–15.5)
WBC: 10.6 10*3/uL — ABNORMAL HIGH (ref 4.0–10.5)
WBC: 15.2 10*3/uL — ABNORMAL HIGH (ref 4.0–10.5)
nRBC: 0 % (ref 0.0–0.2)
nRBC: 0 % (ref 0.0–0.2)

## 2023-03-20 LAB — BASIC METABOLIC PANEL
Anion gap: 11 (ref 5–15)
Anion gap: 15 (ref 5–15)
BUN: 27 mg/dL — ABNORMAL HIGH (ref 6–20)
BUN: 29 mg/dL — ABNORMAL HIGH (ref 6–20)
CO2: 22 mmol/L (ref 22–32)
CO2: 21 mmol/L — ABNORMAL LOW (ref 22–32)
Calcium: 8.6 mg/dL — ABNORMAL LOW (ref 8.9–10.3)
Calcium: 8.9 mg/dL (ref 8.9–10.3)
Chloride: 99 mmol/L (ref 98–111)
Chloride: 99 mmol/L (ref 98–111)
Creatinine, Ser: 1.61 mg/dL — ABNORMAL HIGH (ref 0.61–1.24)
Creatinine, Ser: 1.82 mg/dL — ABNORMAL HIGH (ref 0.61–1.24)
GFR, Estimated: 52 mL/min — ABNORMAL LOW (ref >60)
GFR, Estimated: 45 mL/min — ABNORMAL LOW (ref >60)
Glucose, Bld: 127 mg/dL — ABNORMAL HIGH (ref 70–99)
Glucose, Bld: 155 mg/dL — ABNORMAL HIGH (ref 70–99)
Potassium: 4.2 mmol/L (ref 3.5–5.1)
Potassium: 4.6 mmol/L (ref 3.5–5.1)
Sodium: 132 mmol/L — ABNORMAL LOW (ref 135–145)
Sodium: 135 mmol/L (ref 135–145)

## 2023-03-20 LAB — GLUCOSE, CAPILLARY
Glucose-Capillary: 122 mg/dL — ABNORMAL HIGH (ref 70–99)
Glucose-Capillary: 122 mg/dL — ABNORMAL HIGH (ref 70–99)
Glucose-Capillary: 221 mg/dL — ABNORMAL HIGH (ref 70–99)

## 2023-03-20 LAB — HIV ANTIBODY (ROUTINE TESTING W REFLEX): HIV Screen 4th Generation wRfx: NONREACTIVE

## 2023-03-20 LAB — MAGNESIUM: Magnesium: 2.3 mg/dL (ref 1.7–2.4)

## 2023-03-20 SURGERY — ARTHROPLASTY, HIP, TOTAL, ANTERIOR APPROACH
Anesthesia: General | Site: Hip | Laterality: Right

## 2023-03-20 MED ORDER — MIDAZOLAM HCL 2 MG/2ML IJ SOLN
INTRAMUSCULAR | Status: AC
  Filled 2023-03-20: qty 2

## 2023-03-20 MED ORDER — MIDAZOLAM HCL 2 MG/2ML IJ SOLN: 0.5000 mg | Freq: Once | INTRAMUSCULAR | Status: DC | PRN

## 2023-03-20 MED ORDER — FENTANYL CITRATE (PF) 250 MCG/5ML IJ SOLN
INTRAMUSCULAR | Status: AC
  Filled 2023-03-20: qty 5

## 2023-03-20 MED ORDER — ROCURONIUM BROMIDE 10 MG/ML (PF) SYRINGE
PREFILLED_SYRINGE | INTRAVENOUS | Status: AC
  Filled 2023-03-20: qty 10

## 2023-03-20 MED ORDER — HYDROMORPHONE HCL 1 MG/ML IJ SOLN: 0.5000 mg | INTRAMUSCULAR | Status: DC | PRN

## 2023-03-20 MED ORDER — ONDANSETRON HCL 4 MG/2ML IJ SOLN: 4.0000 mg | Freq: Four times a day (QID) | INTRAMUSCULAR | Status: DC | PRN

## 2023-03-20 MED ORDER — CHLORHEXIDINE GLUCONATE 0.12 % MT SOLN
OROMUCOSAL | Status: AC
  Filled 2023-03-20: qty 15

## 2023-03-20 MED ORDER — LACTATED RINGERS IV SOLN
INTRAVENOUS | Status: DC | PRN
Start: 2023-03-20 — End: 2023-03-20

## 2023-03-20 MED ORDER — HYDROMORPHONE HCL 1 MG/ML IJ SOLN
0.2500 mg | INTRAMUSCULAR | Status: DC | PRN
  Administered 2023-03-20: 0.5 mg via INTRAVENOUS

## 2023-03-20 MED ORDER — FENTANYL CITRATE (PF) 100 MCG/2ML IJ SOLN
INTRAMUSCULAR | Status: AC
  Filled 2023-03-20: qty 2

## 2023-03-20 MED ORDER — LIDOCAINE 2% (20 MG/ML) 5 ML SYRINGE
INTRAMUSCULAR | Status: AC
  Filled 2023-03-20: qty 5

## 2023-03-20 MED ORDER — PROMETHAZINE HCL 25 MG/ML IJ SOLN: 6.2500 mg | INTRAMUSCULAR | Status: DC | PRN

## 2023-03-20 MED ORDER — MENTHOL 3 MG MT LOZG: 1.0000 | LOZENGE | OROMUCOSAL | Status: DC | PRN

## 2023-03-20 MED ORDER — METOCLOPRAMIDE HCL 5 MG PO TABS: 5.0000 mg | ORAL_TABLET | Freq: Three times a day (TID) | ORAL | Status: DC | PRN

## 2023-03-20 MED ORDER — ONDANSETRON HCL 4 MG/2ML IJ SOLN
INTRAMUSCULAR | Status: DC | PRN
  Administered 2023-03-20: 4 mg via INTRAVENOUS

## 2023-03-20 MED ORDER — OXYCODONE HCL 5 MG PO TABS
5.0000 mg | ORAL_TABLET | ORAL | Status: DC | PRN
  Administered 2023-03-20 – 2023-03-21 (×5): 10 mg via ORAL
  Filled 2023-03-20 (×3): qty 2

## 2023-03-20 MED ORDER — PROPOFOL 10 MG/ML IV BOLUS
INTRAVENOUS | Status: AC
  Filled 2023-03-20: qty 20

## 2023-03-20 MED ORDER — FENTANYL CITRATE PF 50 MCG/ML IJ SOSY
50.0000 ug | PREFILLED_SYRINGE | Freq: Once | INTRAMUSCULAR | Status: AC
  Administered 2023-03-20: 50 ug via INTRAVENOUS

## 2023-03-20 MED ORDER — 0.9 % SODIUM CHLORIDE (POUR BTL) OPTIME
TOPICAL | Status: DC | PRN
  Administered 2023-03-20: 1000 mL

## 2023-03-20 MED ORDER — ASPIRIN 81 MG PO CHEW: 81.0000 mg | CHEWABLE_TABLET | Freq: Two times a day (BID) | ORAL | Status: DC

## 2023-03-20 MED ORDER — CEFAZOLIN SODIUM 1 G IJ SOLR
INTRAMUSCULAR | Status: AC
  Filled 2023-03-20: qty 20

## 2023-03-20 MED ORDER — PHENOL 1.4 % MT LIQD: 1.0000 | OROMUCOSAL | Status: DC | PRN

## 2023-03-20 MED ORDER — GLYCOPYRROLATE PF 0.2 MG/ML IJ SOSY
PREFILLED_SYRINGE | INTRAMUSCULAR | Status: AC
  Filled 2023-03-20: qty 1

## 2023-03-20 MED ORDER — TRANEXAMIC ACID-NACL 1000-0.7 MG/100ML-% IV SOLN
INTRAVENOUS | Status: DC | PRN
  Administered 2023-03-20: 1000 mg via INTRAVENOUS

## 2023-03-20 MED ORDER — SODIUM CHLORIDE 0.9 % IR SOLN
Status: DC | PRN
  Administered 2023-03-20: 3000 mL

## 2023-03-20 MED ORDER — LACTATED RINGERS IV SOLN: INTRAVENOUS | Status: DC

## 2023-03-20 MED ORDER — CEFAZOLIN SODIUM-DEXTROSE 1-4 GM/50ML-% IV SOLN
INTRAVENOUS | Status: DC | PRN
  Administered 2023-03-20: 2 g via INTRAVENOUS

## 2023-03-20 MED ORDER — LABETALOL HCL 5 MG/ML IV SOLN
INTRAVENOUS | Status: DC | PRN
  Administered 2023-03-20: 2.5 mg via INTRAVENOUS

## 2023-03-20 MED ORDER — DIPHENHYDRAMINE HCL 12.5 MG/5ML PO ELIX: 12.5000 mg | ORAL_SOLUTION | ORAL | Status: DC | PRN

## 2023-03-20 MED ORDER — INSULIN ASPART 100 UNIT/ML IJ SOLN: 0.0000 [IU] | INTRAMUSCULAR | Status: DC | PRN

## 2023-03-20 MED ORDER — PHENYLEPHRINE 80 MCG/ML (10ML) SYRINGE FOR IV PUSH (FOR BLOOD PRESSURE SUPPORT)
PREFILLED_SYRINGE | INTRAVENOUS | Status: DC | PRN
  Administered 2023-03-20: 80 ug via INTRAVENOUS
  Administered 2023-03-20 (×3): 160 ug via INTRAVENOUS

## 2023-03-20 MED ORDER — ONDANSETRON HCL 4 MG PO TABS: 4.0000 mg | ORAL_TABLET | Freq: Four times a day (QID) | ORAL | Status: DC | PRN

## 2023-03-20 MED ORDER — CEFAZOLIN SODIUM-DEXTROSE 1-4 GM/50ML-% IV SOLN
1.0000 g | Freq: Four times a day (QID) | INTRAVENOUS | Status: AC
  Administered 2023-03-20 (×2): 1 g via INTRAVENOUS
  Filled 2023-03-20 (×2): qty 50

## 2023-03-20 MED ORDER — OXYCODONE HCL 5 MG PO TABS
10.0000 mg | ORAL_TABLET | ORAL | Status: DC | PRN
  Administered 2023-03-21 – 2023-03-23 (×10): 15 mg via ORAL
  Filled 2023-03-20 (×12): qty 3

## 2023-03-20 MED ORDER — ORAL CARE MOUTH RINSE: 15.0000 mL | Freq: Once | OROMUCOSAL | Status: AC

## 2023-03-20 MED ORDER — LIDOCAINE 2% (20 MG/ML) 5 ML SYRINGE
INTRAMUSCULAR | Status: DC | PRN
  Administered 2023-03-20: 20 mg via INTRAVENOUS

## 2023-03-20 MED ORDER — DOCUSATE SODIUM 100 MG PO CAPS: 100.0000 mg | ORAL_CAPSULE | Freq: Two times a day (BID) | ORAL | Status: DC

## 2023-03-20 MED ORDER — ACETAMINOPHEN 325 MG PO TABS: 325.0000 mg | ORAL_TABLET | Freq: Four times a day (QID) | ORAL | Status: DC | PRN

## 2023-03-20 MED ORDER — KETAMINE HCL 50 MG/5ML IJ SOSY
PREFILLED_SYRINGE | INTRAMUSCULAR | Status: AC
  Filled 2023-03-20: qty 5

## 2023-03-20 MED ORDER — ACETAMINOPHEN 10 MG/ML IV SOLN
INTRAVENOUS | Status: AC
  Filled 2023-03-20: qty 100

## 2023-03-20 MED ORDER — ETOMIDATE 2 MG/ML IV SOLN
INTRAVENOUS | Status: DC | PRN
  Administered 2023-03-20 (×2): 10 mg via INTRAVENOUS

## 2023-03-20 MED ORDER — CHLORHEXIDINE GLUCONATE 0.12 % MT SOLN
15.0000 mL | Freq: Once | OROMUCOSAL | Status: AC
  Administered 2023-03-20: 15 mL via OROMUCOSAL

## 2023-03-20 MED ORDER — LABETALOL HCL 5 MG/ML IV SOLN
INTRAVENOUS | Status: AC
  Filled 2023-03-20: qty 4

## 2023-03-20 MED ORDER — HYDROMORPHONE HCL 1 MG/ML IJ SOLN
INTRAMUSCULAR | Status: AC
  Filled 2023-03-20: qty 1

## 2023-03-20 MED ORDER — ETOMIDATE 2 MG/ML IV SOLN
INTRAVENOUS | Status: AC
  Filled 2023-03-20: qty 10

## 2023-03-20 MED ORDER — SUGAMMADEX SODIUM 200 MG/2ML IV SOLN
INTRAVENOUS | Status: DC | PRN
  Administered 2023-03-20: 20 mg via INTRAVENOUS
  Administered 2023-03-20: 40 mg via INTRAVENOUS
  Administered 2023-03-20: 50 mg via INTRAVENOUS
  Administered 2023-03-20 (×3): 30 mg via INTRAVENOUS

## 2023-03-20 MED ORDER — OXYCODONE HCL 5 MG/5ML PO SOLN: 5.0000 mg | Freq: Once | ORAL | Status: DC | PRN

## 2023-03-20 MED ORDER — FENTANYL CITRATE (PF) 250 MCG/5ML IJ SOLN
INTRAMUSCULAR | Status: DC | PRN
  Administered 2023-03-20 (×2): 50 ug via INTRAVENOUS
  Administered 2023-03-20: 100 ug via INTRAVENOUS
  Administered 2023-03-20: 50 ug via INTRAVENOUS

## 2023-03-20 MED ORDER — ROCURONIUM BROMIDE 10 MG/ML (PF) SYRINGE
PREFILLED_SYRINGE | INTRAVENOUS | Status: DC | PRN
  Administered 2023-03-20: 20 mg via INTRAVENOUS
  Administered 2023-03-20: 60 mg via INTRAVENOUS

## 2023-03-20 MED ORDER — PHENYLEPHRINE 80 MCG/ML (10ML) SYRINGE FOR IV PUSH (FOR BLOOD PRESSURE SUPPORT)
PREFILLED_SYRINGE | INTRAVENOUS | Status: AC
  Filled 2023-03-20: qty 10

## 2023-03-20 MED ORDER — SODIUM CHLORIDE 0.9 % IV SOLN: INTRAVENOUS | Status: DC

## 2023-03-20 MED ORDER — METOCLOPRAMIDE HCL 5 MG/ML IJ SOLN: 5.0000 mg | Freq: Three times a day (TID) | INTRAMUSCULAR | Status: DC | PRN

## 2023-03-20 MED ORDER — OXYCODONE HCL 5 MG PO TABS: 5.0000 mg | ORAL_TABLET | Freq: Once | ORAL | Status: DC | PRN

## 2023-03-20 MED ORDER — TRANEXAMIC ACID-NACL 1000-0.7 MG/100ML-% IV SOLN
INTRAVENOUS | Status: AC
  Filled 2023-03-20: qty 100

## 2023-03-20 MED ORDER — MEPERIDINE HCL 25 MG/ML IJ SOLN: 6.2500 mg | INTRAMUSCULAR | Status: DC | PRN

## 2023-03-20 MED ORDER — PHENYLEPHRINE HCL-NACL 20-0.9 MG/250ML-% IV SOLN
INTRAVENOUS | Status: DC | PRN
  Administered 2023-03-20: 40 ug/min via INTRAVENOUS
  Administered 2023-03-20: 20 ug/min via INTRAVENOUS

## 2023-03-20 MED ORDER — DEXAMETHASONE SODIUM PHOSPHATE 10 MG/ML IJ SOLN
INTRAMUSCULAR | Status: DC | PRN
  Administered 2023-03-20: 4 mg via INTRAVENOUS

## 2023-03-20 MED ORDER — DEXAMETHASONE SODIUM PHOSPHATE 10 MG/ML IJ SOLN
INTRAMUSCULAR | Status: AC
  Filled 2023-03-20: qty 1

## 2023-03-20 MED ORDER — MIDAZOLAM HCL 2 MG/2ML IJ SOLN
INTRAMUSCULAR | Status: DC | PRN
  Administered 2023-03-20 (×2): 1 mg via INTRAVENOUS

## 2023-03-20 MED ORDER — KETAMINE HCL 10 MG/ML IJ SOLN
INTRAMUSCULAR | Status: DC | PRN
Start: 2023-03-20 — End: 2023-03-20
  Administered 2023-03-20: 10 mg via INTRAVENOUS
  Administered 2023-03-20 (×2): 20 mg via INTRAVENOUS

## 2023-03-20 MED ORDER — ONDANSETRON HCL 4 MG/2ML IJ SOLN
INTRAMUSCULAR | Status: AC
  Filled 2023-03-20: qty 4

## 2023-03-20 SURGICAL SUPPLY — 56 items
ACETAB CUP W/GRIPTION 54 (Plate) ×1 IMPLANT
APL SKNCLS STERI-STRIP NONHPOA (GAUZE/BANDAGES/DRESSINGS) ×1
ARTICULEZE HEAD (Hips) ×1 IMPLANT
BAG COUNTER SPONGE SURGICOUNT (BAG) ×1 IMPLANT
BAG SPNG CNTER NS LX DISP (BAG) ×1
BENZOIN TINCTURE PRP APPL 2/3 (GAUZE/BANDAGES/DRESSINGS) ×1 IMPLANT
BLADE CLIPPER SURG (BLADE) IMPLANT
BLADE SAW SGTL 18X1.27X75 (BLADE) ×1 IMPLANT
COVER SURGICAL LIGHT HANDLE (MISCELLANEOUS) ×1 IMPLANT
CUP ACETAB W/GRIPTION 54 (Plate) IMPLANT
DRAPE C-ARM 42X72 X-RAY (DRAPES) ×1 IMPLANT
DRAPE STERI IOBAN 125X83 (DRAPES) ×1 IMPLANT
DRAPE U-SHAPE 47X51 STRL (DRAPES) ×3 IMPLANT
DRSG AQUACEL AG ADV 3.5X10 (GAUZE/BANDAGES/DRESSINGS) ×1 IMPLANT
DURAPREP 26ML APPLICATOR (WOUND CARE) ×1 IMPLANT
ELECT BLADE 4.0 EZ CLEAN MEGAD (MISCELLANEOUS) ×1
ELECT BLADE 6.5 EXT (BLADE) IMPLANT
ELECT REM PT RETURN 9FT ADLT (ELECTROSURGICAL) ×1
ELECTRODE BLDE 4.0 EZ CLN MEGD (MISCELLANEOUS) ×1 IMPLANT
ELECTRODE REM PT RTRN 9FT ADLT (ELECTROSURGICAL) ×1 IMPLANT
FACESHIELD WRAPAROUND (MASK) ×2 IMPLANT
FACESHIELD WRAPAROUND OR TEAM (MASK) ×2 IMPLANT
GLOVE BIOGEL PI IND STRL 8 (GLOVE) ×2 IMPLANT
GLOVE ECLIPSE 8.0 STRL XLNG CF (GLOVE) ×1 IMPLANT
GLOVE ORTHO TXT STRL SZ7.5 (GLOVE) ×2 IMPLANT
GOWN STRL REUS W/ TWL LRG LVL3 (GOWN DISPOSABLE) ×2 IMPLANT
GOWN STRL REUS W/ TWL XL LVL3 (GOWN DISPOSABLE) ×2 IMPLANT
GOWN STRL REUS W/TWL LRG LVL3 (GOWN DISPOSABLE) ×2
GOWN STRL REUS W/TWL XL LVL3 (GOWN DISPOSABLE) ×2
HANDPIECE INTERPULSE COAX TIP (DISPOSABLE) ×1
HEAD ARTICULEZE (Hips) IMPLANT
KIT BASIN OR (CUSTOM PROCEDURE TRAY) ×1 IMPLANT
KIT TURNOVER KIT B (KITS) ×1 IMPLANT
LINER NEUTRAL 54X36MM PLUS 4 (Hips) IMPLANT
MANIFOLD NEPTUNE II (INSTRUMENTS) ×1 IMPLANT
NS IRRIG 1000ML POUR BTL (IV SOLUTION) ×1 IMPLANT
PACK TOTAL JOINT (CUSTOM PROCEDURE TRAY) ×1 IMPLANT
PAD ARMBOARD 7.5X6 YLW CONV (MISCELLANEOUS) ×1 IMPLANT
SET HNDPC FAN SPRY TIP SCT (DISPOSABLE) ×1 IMPLANT
STAPLER VISISTAT 35W (STAPLE) IMPLANT
STEM FEMORAL SZ5 HIGH ACTIS (Stem) IMPLANT
STRIP CLOSURE SKIN 1/2X4 (GAUZE/BANDAGES/DRESSINGS) ×2 IMPLANT
SUT ETHIBOND NAB CT1 #1 30IN (SUTURE) ×1 IMPLANT
SUT MNCRL AB 4-0 PS2 18 (SUTURE) IMPLANT
SUT VIC AB 0 CT1 27 (SUTURE) ×1
SUT VIC AB 0 CT1 27XBRD ANBCTR (SUTURE) ×1 IMPLANT
SUT VIC AB 1 CT1 27 (SUTURE) ×1
SUT VIC AB 1 CT1 27XBRD ANBCTR (SUTURE) ×1 IMPLANT
SUT VIC AB 2-0 CT1 27 (SUTURE) ×1
SUT VIC AB 2-0 CT1 TAPERPNT 27 (SUTURE) ×1 IMPLANT
TOWEL GREEN STERILE (TOWEL DISPOSABLE) ×1 IMPLANT
TOWEL GREEN STERILE FF (TOWEL DISPOSABLE) ×1 IMPLANT
TRAY CATH INTERMITTENT SS 16FR (CATHETERS) IMPLANT
TRAY FOLEY W/BAG SLVR 16FR (SET/KITS/TRAYS/PACK)
TRAY FOLEY W/BAG SLVR 16FR ST (SET/KITS/TRAYS/PACK) IMPLANT
WATER STERILE IRR 1000ML POUR (IV SOLUTION) ×2 IMPLANT

## 2023-03-20 NOTE — Anesthesia Procedure Notes (Signed)
Procedure Name: Intubation Date/Time: 03/20/2023 9:34 AM  Performed by: Chelsea Aus, CRNAPre-anesthesia Checklist: Patient identified, Emergency Drugs available, Suction available and Patient being monitored Patient Re-evaluated:Patient Re-evaluated prior to induction Oxygen Delivery Method: Circle system utilized Preoxygenation: Pre-oxygenation with 100% oxygen Induction Type: IV induction Ventilation: Mask ventilation without difficulty and Oral airway inserted - appropriate to patient size Laryngoscope Size: Mac and 4 Grade View: Grade I Tube type: Oral Tube size: 7.5 mm Number of attempts: 1 Placement Confirmation: ETT inserted through vocal cords under direct vision, positive ETCO2 and breath sounds checked- equal and bilateral Secured at: 24 cm Tube secured with: Tape Dental Injury: Teeth and Oropharynx as per pre-operative assessment  Comments: 90 mm OA

## 2023-03-20 NOTE — Anesthesia Preprocedure Evaluation (Addendum)
Anesthesia Evaluation  Patient identified by MRN, date of birth, ID band Patient awake    Reviewed: Allergy & Precautions, NPO status , Patient's Chart, lab work & pertinent test results, reviewed documented beta blocker date and time   History of Anesthesia Complications Negative for: history of anesthetic complications  Airway Mallampati: II  TM Distance: >3 FB Neck ROM: Full    Dental  (+) Missing, Dental Advisory Given   Pulmonary shortness of breath, COPD, former smoker   breath sounds clear to auscultation       Cardiovascular hypertension, Pt. on medications and Pt. on home beta blockers (-) angina + CAD, + Past MI, + Cardiac Stents, + CABG, + Peripheral Vascular Disease and +CHF  + dysrhythmias Atrial Fibrillation and Ventricular Tachycardia  Rhythm:Irregular Rate:Normal  11/2022 ECHO:  1. Anteroseptal, inferoseptal, and inferior hypokinesis. Left ventricular EF 25 to 30%. The LV has severely decreased function, mild concentric LVH. Grade I diastolic dysfunction (impaired relaxation).   2. RVF is normal. The right ventricular size is normal.   3. The mitral valve is normal in structure. No evidence of MR. No evidence of MS.   4. The aortic valve is tricuspid. AI is mild. No AS     Neuro/Psych    Depression    negative neurological ROS     GI/Hepatic negative GI ROS, Neg liver ROS,,,  Endo/Other  diabetes (glu 122), Oral Hypoglycemic Agents  Semaglutide: 8d ago  Renal/GU Renal InsufficiencyRenal disease     Musculoskeletal   Abdominal   Peds  Hematology Hb 12.9, plt 254k   Anesthesia Other Findings   Reproductive/Obstetrics                             Anesthesia Physical Anesthesia Plan  ASA: 4  Anesthesia Plan: General   Post-op Pain Management: Tylenol PO (pre-op)*   Induction: Intravenous  PONV Risk Score and Plan: 2 and Ondansetron and Dexamethasone  Airway Management  Planned: Oral ETT  Additional Equipment: ClearSight  Intra-op Plan:   Post-operative Plan: Extubation in OR  Informed Consent: I have reviewed the patients History and Physical, chart, labs and discussed the procedure including the risks, benefits and alternatives for the proposed anesthesia with the patient or authorized representative who has indicated his/her understanding and acceptance.     Dental advisory given  Plan Discussed with: CRNA and Surgeon  Anesthesia Plan Comments:         Anesthesia Quick Evaluation

## 2023-03-20 NOTE — Transfer of Care (Signed)
Immediate Anesthesia Transfer of Care Note  Patient: Adam James  Procedure(s) Performed: TOTAL HIP ARTHROPLASTY ANTERIOR APPROACH (Right: Hip)  Patient Location: PACU  Anesthesia Type:General  Level of Consciousness: drowsy and patient cooperative  Airway & Oxygen Therapy: Patient Spontanous Breathing and Patient connected to face mask oxygen  Post-op Assessment: Report given to RN and Post -op Vital signs reviewed and stable  Post vital signs: Reviewed and stable  Last Vitals:  Vitals Value Taken Time  BP 131/82 03/20/23 1109  Temp 36.4 C 03/20/23 1109  Pulse 79 03/20/23 1112  Resp 16 03/20/23 1112  SpO2 98 % 03/20/23 1112  Vitals shown include unfiled device data.  Last Pain:  Vitals:   03/20/23 0849  TempSrc:   PainSc: 4       Patients Stated Pain Goal: 4 (03/20/23 0315)  Complications: No notable events documented.

## 2023-03-20 NOTE — Evaluation (Signed)
Physical Therapy Evaluation Patient Details Name: Adam James MRN: [id number] DOB: 01/31/1971 Today's Date: 03/20/2023  History of Present Illness  Pt. is 52 yo male who presents on 03/19/23 after falling when L knee gave way and sustaining R femoral neck fx. Underwent R anterior direct THA 03/20/23.  PMH: CHF with EF of 25%, DM II, MI, OM, CABG, CKD, HTN, L BKA, defib placement, fall 03/10/23 with R clavicle fx and injury to L residual limb  Clinical Impression  Pt admitted with above diagnosis. Pt from home with his daughter but is alone a lot of the time. Was independent before these falls. He reports he is NWB RUE, would be helpful to know if this is his actual WB status to be able to progress mobility. Pt reports he still has LLE pain in his residual limb since the first fall. His daughter is bringing prosthesis so he can see if he can stand on LLE. Min A needed for bed mobility and pt taught THA exercises and given handout. Pt attempted sit>stand with use of stedy but was unable to tolerate due to R hip pain. Patient will benefit from continued inpatient follow up therapy, <3 hours/day  Pt currently with functional limitations due to the deficits listed below (see PT Problem List). Pt will benefit from acute skilled PT to increase their independence and safety with mobility to allow discharge.           If plan is discharge home, recommend the following: Two people to help with walking and/or transfers;A lot of help with bathing/dressing/bathroom;Assist for transportation;Help with stairs or ramp for entrance;Assistance with cooking/housework   Can travel by private vehicle   No    Equipment Recommendations Other (comment) (tub bench)  Recommendations for Other Services  OT consult    Functional Status Assessment Patient has had a recent decline in their functional status and demonstrates the ability to make significant improvements in function in a reasonable and predictable amount  of time.     Precautions / Restrictions Precautions Precautions: Fall Required Braces or Orthoses: Sling Restrictions Weight Bearing Restrictions: Yes RUE Weight Bearing: Non weight bearing (pt reports he was told not to put wt through RUE) RLE Weight Bearing: Weight bearing as tolerated      Mobility  Bed Mobility Overal bed mobility: Needs Assistance Bed Mobility: Supine to Sit, Sit to Supine     Supine to sit: Min assist Sit to supine: Min assist   General bed mobility comments: min A to R hip to come to EOB, pt able to manage trunk. Min A to RLE for return to supine.    Transfers Overall transfer level: Needs assistance Equipment used: Ambulation equipment used Transfers: Sit to/from Stand Sit to Stand: From elevated surface           General transfer comment: pt attempted to achieve partial stand with LUE and RLE, unable to clear buttocls due to pain but was able to shift wt onto RLE and tolerate Transfer via Lift Equipment: Stedy  Ambulation/Gait               General Gait Details: unable  Stairs            Wheelchair Mobility     Tilt Bed    Modified Rankin (Stroke Patients Only)       Balance Overall balance assessment: Needs assistance Sitting-balance support: No upper extremity supported, Feet supported Sitting balance-[name] Scale: Good         Standing balance  comment: unable                             Pertinent Vitals/Pain      Home Living Family/patient expects to be discharged to:: Private residence Living Arrangements: Children Available Help at Discharge: Family;Available PRN/intermittently Type of Home: Mobile home Home Access: Ramped entrance       Home Layout: One level Home Equipment: Agricultural consultant (2 wheels);Wheelchair - manual Additional Comments: lives with 67 yo daughter who works all different shifts, alone when she's gone    Prior Function Prior Level of Function : Independent/Modified  Independent             Mobility Comments: independent before fall, except does not drive       Extremity/Trunk Assessment   Upper Extremity Assessment Upper Extremity Assessment: Defer to OT evaluation    Lower Extremity Assessment Lower Extremity Assessment: RLE deficits/detail RLE Deficits / Details: hip flex 2-/5, knee ext 3/5 RLE: Unable to fully assess due to pain RLE Sensation: WNL RLE Coordination: decreased gross motor LLE Deficits / Details: pain in distal residual limb since his first fall. Daughter to bring prosthesis to see if he can put wt safely on L. Lacks end range L knee extension LLE Sensation: WNL LLE Coordination: WNL    Cervical / Trunk Assessment Cervical / Trunk Assessment: Normal  Communication   Communication Communication: No apparent difficulties  Cognition Arousal: Alert Behavior During Therapy: WFL for tasks assessed/performed Overall Cognitive Status: Within Functional Limits for tasks assessed                                          General Comments General comments (skin integrity, edema, etc.): SPO2 low 90's on RA. HR stable    Exercises Total Joint Exercises Ankle Circles/Pumps: AROM, Right, 10 reps, Supine Quad Sets: AROM, Right, 10 reps, Supine Gluteal Sets: AROM, Right, 10 reps, Supine Heel Slides: AAROM, Right, 10 reps, Supine Hip ABduction/ADduction: AAROM, Right, 10 reps, Supine Straight Leg Raises: AAROM, Right, 5 reps, Supine Long Arc Quad: AROM, Right, 10 reps, Seated   Assessment/Plan    PT Assessment Patient needs continued PT services  PT Problem List Decreased strength;Decreased range of motion;Decreased activity tolerance;Decreased balance;Decreased mobility;Decreased knowledge of use of DME;Decreased safety awareness;Decreased knowledge of precautions;Pain       PT Treatment Interventions DME instruction;Gait training;Functional mobility training;Therapeutic activities;Therapeutic  exercise;Balance training;Neuromuscular re-education;Cognitive remediation;Patient/family education    PT Goals (Current goals can be found in the Care Plan section)  Acute Rehab PT Goals Patient Stated Goal: return home PT Goal Formulation: With patient Time For Goal Achievement: 04/03/23 Potential to Achieve Goals: Good    Frequency Min 1X/week     James-evaluation               AM-PAC PT "6 Clicks" Mobility  Outcome Measure Help needed turning from your back to your side while in a flat bed without using bedrails?: A Little Help needed moving from lying on your back to sitting on the side of a flat bed without using bedrails?: A Little Help needed moving to and from a bed to a chair (including a wheelchair)?: Total Help needed standing up from a chair using your arms (e.g., wheelchair or bedside chair)?: Total Help needed to walk in hospital room?: Total Help needed climbing 3-5 steps with  a railing? : Total 6 Click Score: 10    End of Session Equipment Utilized During Treatment: Gait belt Activity Tolerance: Patient tolerated treatment well Patient left: in bed;with call bell/phone within reach;with bed alarm set Nurse Communication: Mobility status PT Visit Diagnosis: Unsteadiness on feet (R26.81);History of falling (Z91.81);Pain;Difficulty in walking, not elsewhere classified (R26.2) Pain - Right/Left: Right Pain - part of body: Hip    Time: 6045-4098 PT Time Calculation (min) (ACUTE ONLY): 29 min   Charges:   PT Evaluation $PT Eval Moderate Complexity: 1 Mod PT Treatments $Therapeutic Exercise: 8-22 mins PT General Charges $$ ACUTE PT VISIT: 1 Visit         Adam James, PT  Acute Rehab Services Secure chat preferred Office [phone]   Adam James Adam James 03/20/2023, 3:28 PM

## 2023-03-20 NOTE — Progress Notes (Signed)
Pt. Off unit to OR.

## 2023-03-20 NOTE — Progress Notes (Signed)
Patient ID: Adam James, male   DOB: October 04, 1970, 52 y.o.   MRN: [id number] The patient understands that I have recommended a right total hip replacement to treat his displaced right hip femoral neck fracture.  I did see him yesterday as a consultation for this injury.  He has been also seen by cardiology and was graciously admitted to the hospitalist service.  I did explain to him at the bedside today with the surgery involves and the risks and benefits of having his right hip replaced.  I reviewed the notes again within epic as well as his vital signs and labs.  All questions concerns were addressed and answered.  The risks and benefits of surgery have been discussed.  Informed consent has been obtained and the right operative hip has been marked.

## 2023-03-20 NOTE — Progress Notes (Signed)
PROGRESS NOTE    Adam James  ZOX:[id number] DOB: 1971/04/25 DOA: 03/19/2023 PCP: Hoy Register, MD   Brief Narrative:  Patient is a 52 year old Caucasian male with a history of type 2 diabetes, ischemic cardiomyopathy status post AICD in July 2024, history of left below the knee amputation, chronic systolic heart failure EF of 25%, CKD stage IIIa baseline creatinine 1.6-1.8, hyperlipidemia, coronary disease status post CABG presents to the ER today after a fall.  Imaging notable for right femoral neck fracture.  Hospitalist called for admission, orthopedics, Dr. Magnus Ivan, called in consult.  Assessment & Plan:   Principal Problem:   Fracture of femoral neck, right, closed (HCC) Active Problems:   Ischemic cardiomyopathy with implantable cardioverter-defibrillator (ICD)   Type 2 diabetes mellitus (HCC)   Dyslipidemia   Chronic combined systolic (congestive) and diastolic (congestive) heart failure (HCC) - LVEF 25%   S/P CABG x 3   Below-knee amputation of left lower extremity (HCC)   S/P ICD (internal cardiac defibrillator) procedure - Abbott GALLANT VR Serial [id number]. MRI Compatible   CKD stage 3a, GFR 45-59 ml/min (HCC) - baseline SCr 1.6-1.8   Closed subcapital fracture of right femur (HCC)   Preop cardiovascular exam   S/P BKA (below knee amputation) unilateral, left (HCC)   Fracture of femoral neck, right, closed (HCC) Ambulatory dysfunction, acute -Orthopedic surgery following, appreciate insight and recommendations -Plan for ORIF later today -Plan for aspirin 81 twice daily as DVT prophylaxis -Pain control ongoing, postoperative PT OT evaluation pending surgical clearance   CKD stage 3a, GFR 45-59 ml/min (HCC) - baseline SCr 1.6-1.8 Stable -at baseline   S/P ICD (internal cardiac defibrillator) procedure - Abbott GALLANT VR Serial [id number]. MRI Compatible Stable. AICD placed 02-24-2023.   Below-knee amputation of left lower extremity (HCC) Chronic. Pt has  left LE prosthesis.   S/P CABG x 3 Stable.   Chronic combined systolic (congestive) and diastolic (congestive) heart failure (HCC) - LVEF 25% Stable. Euvolemic.   Dyslipidemia Continue crestor.   Type 2 diabetes mellitus (HCC) Continue sliding scale, hypoglycemic protocol  Diabetic diet   Ischemic cardiomyopathy with implantable cardioverter-defibrillator (ICD) Stable. No chest pain.   DVT prophylaxis: SCDs Start: 03/19/23 1618 aspirin 81 mg twice daily Code Status:   Code Status: Full Code Family Communication: None present  Status is: Inpatient  Dispo: The patient is from: Home              Anticipated d/c is to: To be determined              Anticipated d/c date is: 48 to 72 hours              Patient currently not medically stable for discharge  Consultants:  Orthopedics  Procedures:  ORIF right femur fracture 8/24  Antimicrobials:  Perioperatively  Subjective: No acute issues or events overnight denies nausea vomiting diarrhea constipation headache fevers chills or chest pain  Objective: Vitals:   03/19/23 1657 03/19/23 2008 03/20/23 0006 03/20/23 0523  BP: (!) 158/84 128/81 133/87 111/76  Pulse: 98 93 87 87  Resp: 17 18 18 18   Temp: 98.3 F (36.8 C) 98.2 F (36.8 C) 98.4 F (36.9 C) 98.2 F (36.8 C)  TempSrc: Oral Oral Oral Oral  SpO2: 92% 92% 94% 91%  Weight:      Height:        Intake/Output Summary (Last 24 hours) at 03/20/2023 0739 Last data filed at 03/19/2023 2017 Gross per 24 hour  Intake 120 ml  Output  275 ml  Net -155 ml   Filed Weights   03/19/23 1247  Weight: 99 kg    Examination:  General exam: Appears calm and comfortable  Respiratory system: Clear to auscultation. Respiratory effort normal. Cardiovascular system: S1 & S2 heard, RRR. No JVD, murmurs, rubs, gallops or clicks. No pedal edema. Gastrointestinal system: Abdomen is nondistended, soft and nontender. No organomegaly or masses felt. Normal bowel sounds  heard. Central nervous system: Alert and oriented. No focal neurological deficits. Extremities: Left BKA Skin: No rashes, lesions or ulcers  Data Reviewed: I have personally reviewed following labs and imaging studies  GFR: Estimated Creatinine Clearance: 65.6 mL/min (A) (by C-G formula based on SCr of 1.61 mg/dL (H)).  Scheduled Meds:  amiodarone  200 mg Oral Daily   aspirin EC  81 mg Oral BID   carvedilol  3.125 mg Oral BID WC   digoxin  0.125 mg Oral Daily   docusate sodium  100 mg Oral BID   gabapentin  200 mg Oral QHS   glipiZIDE  5 mg Oral QAC breakfast   rosuvastatin  20 mg Oral Daily   spironolactone  25 mg Oral Daily   venlafaxine XR  75 mg Oral Q breakfast   Continuous Infusions:   LOS: 1 day   Time spent:  Azucena Fallen, DO [facility]  If 7PM-7AM, please contact night-coverage [email or web address]  03/20/2023, 7:39 AM

## 2023-03-20 NOTE — Anesthesia Postprocedure Evaluation (Signed)
Anesthesia Post Note  Patient: Adam James  Procedure(s) Performed: TOTAL HIP ARTHROPLASTY ANTERIOR APPROACH (Right: Hip)     Patient location during evaluation: PACU Anesthesia Type: General Level of consciousness: awake and alert, patient cooperative and oriented Pain management: pain level controlled Vital Signs Assessment: post-procedure vital signs reviewed and stable Respiratory status: spontaneous breathing, nonlabored ventilation, respiratory function stable and patient connected to nasal cannula oxygen Cardiovascular status: blood pressure returned to baseline and stable Postop Assessment: no apparent nausea or vomiting Anesthetic complications: no   No notable events documented.  Last Vitals:  Vitals:   03/20/23 1130 03/20/23 1145  BP: 93/64   Pulse: 81 81  Resp: 10 10  Temp:    SpO2: 94% 97%    Last Pain:  Vitals:   03/20/23 1115  TempSrc:   PainSc: 6         RLE Motor Response: Purposeful movement (03/20/23 1145) RLE Sensation: Full sensation (03/20/23 1145)      Kimyah Frein,E. Taber Sweetser

## 2023-03-20 NOTE — Op Note (Signed)
Operative Note  Date of operation: 03/20/2023 Preoperative diagnosis: Closed right hip subcapital femoral neck fracture Postoperative diagnosis: Same  Procedure: Right direct anterior total hip arthroplasty  Implants:  Implant Name Type Inv. Item Serial No. Manufacturer Lot No. LRB No. Used Action  ACETAB CUP W/GRIPTION 54 - [id number] Plate ACETAB CUP W/GRIPTION 54  DEPU[employer] [id number] Right 1 Implanted  LINER NEUTRAL 54X36MM PLUS 4 - [id number] Hips LINER NEUTRAL 54X36MM PLUS 4  DEPUY ORTH[employer] [id number] Right 1 Implanted  STEM FEMORAL SZ5 HIGH ACTIS - [id number] Stem STEM FEMORAL SZ5 HIGH ACTIS  DEPUY ORTHOPAEDICS [id number] Right 1 Implanted  ARTICULEZE HEAD - [id number] Hips ARTICULEZE HEAD  DEPUY ORTHOPA[employer] [id number] Right 1 Implanted   Surgeon: Vanita Panda. Magnus Ivan, MD Assistant: Rexene Edison, PA-C  Anesthesia: General Antibiotics: 2 g IV Ancef EBL: 300 cc Complications: None  Indications: The patient is a 52 year old gentleman with significant comorbidities who unfortunately sustained a mechanical fall yesterday fracturing his right hip.  He has a history of a left below-knee amputation last year.  He has had trouble with his prosthesis and he actually had a mechanical fall last week fracturing his clavicle.  When he fell yesterday this was accidental fall.  He was found to have a displaced right hip femoral neck fracture.  This is a subcapital fracture.  We have recommended a total hip arthroplasty given he is only 52 years old and the fracture is completely displaced.  We had a long and thorough discussion about the risks of acute blood loss anemia, nerve or vessel injury, fracture, infection, dislocation, DVT, implant failure and wound healing issues.  He understands given his significant health issues that these are high risk.  He understands her goals are hopefully decrease pain, improve mobility and overall improve quality of life.  Procedure description: After  informed consent was obtained and the appropriate right hip was marked, the patient was brought to the operating room where general anesthesia was obtained while he is on the stretcher.  A traction boot was placed on his right operative extremity and he was next placed supine on the Hana fracture table with a perineal post in place in the right leg and skeletal traction but no traction applied.  His left lower extremity was protected with a well leg holder.  We then assessed his right hip and pelvis radiographically.  The right hip was prepped and draped with DuraPrep and sterile drapes.  A timeout was called and he was identified as correct patient and correct right hip.  We then made an incision just distal and inferior to the ASIS and carried the slightly obliquely down the leg.  Dissection was carried down to the tensor fascia lata muscle and the tensor fascia was divided longitudinally to proceed with a direct interposed the hip.  Circumflex vessels were identified and cauterized.  The hip capsule identified and opened up in L-type format finding a large hemarthrosis and a displaced femoral neck fracture.  Cobra retractors were placed around the medial and lateral femoral neck and a femoral neck cut was made just distal to the fracture were proximal to the lesser trochanter.  This was made with an oscillating saw and completed with an osteotome.  A corkscrew guide was placed in the femoral head and the femoral head was removed its entirety.  We then placed a bent Hohmann over the medial acetabular rim and removed the acetabular labrum and other debris.  Reaming was then initiated under direct visualization from a size 43  reamer and stepwise increments going up to a size 53 reamer with all reamers again placed under direct visualization and the last reamer was placed under direct fluoroscopy as well in order to obtain the depth of reaming, the inclination and the anteversion.  The real DePuy sector GRIPTION  acetabular component size 54 was then placed without difficulty under fluoroscopy.  A 36+4 polyethylene liner was placed within that size 54 acetabular component.  Attention was then turned to the femur.  With the right leg externally rotated to 120 degrees, extended and adducted, a Mueller retractor was placed medially and a Hohmann retractor was placed behind the greater trochanter.  The lateral joint capsule was released and a box cutting osteotome was used into the femoral canal.  Broaching was then initiated using the Actis broaching system from a size 0 going up to a size 5.  With a size 5 in place we trialed a standard offset femoral neck and a 36+1.5 trial hip ball.  The leg was brought over and up and with traction and internal rotation reduced in the pelvis.  We felt like we needed more offset and leg length.  We do want to make him longer and tighter given he has a prosthetic leg on his other side that can be adjusted.  We want to have him as stable as possible.  We dislocated the hip and removed the trial components.  The real Actis femoral component size 5 with high offset was then placed followed by a 36+5 metal head ball.  Again this was reduced and acetabulum and it was nice and tight.  We are pleased with stability radiographically and clinically.  The soft tissue was then irrigated with normal saline solution.  The joint capsule was closed with interrupted #1 Ethibond suture followed by #1 Vicryl to close the tensor fascia.  0 Vicryl was used to close deep tissue and 2-0 Vicryl was used to close subcutaneous tissue.  Skin was closed with staples.  An Aquacel dressing was applied.  The patient was taken off of the Hana table, awakened, extubated and taken recovery room.  Rexene Edison, PA-C did assist during the entire case and beginning the end and his assistance was crucial and medically necessary for soft tissue management and retraction, helping guide implant placement and a layered closure of the  wound.

## 2023-03-21 DIAGNOSIS — Z89512 Acquired absence of left leg below knee: Secondary | ICD-10-CM | POA: Diagnosis not present

## 2023-03-21 DIAGNOSIS — Z01810 Encounter for preprocedural cardiovascular examination: Secondary | ICD-10-CM | POA: Diagnosis not present

## 2023-03-21 DIAGNOSIS — S88112S Complete traumatic amputation at level between knee and ankle, left lower leg, sequela: Secondary | ICD-10-CM | POA: Diagnosis not present

## 2023-03-21 DIAGNOSIS — E1159 Type 2 diabetes mellitus with other circulatory complications: Secondary | ICD-10-CM | POA: Diagnosis not present

## 2023-03-21 DIAGNOSIS — I5042 Chronic combined systolic (congestive) and diastolic (congestive) heart failure: Secondary | ICD-10-CM | POA: Diagnosis not present

## 2023-03-21 DIAGNOSIS — S72011A Unspecified intracapsular fracture of right femur, initial encounter for closed fracture: Secondary | ICD-10-CM | POA: Diagnosis not present

## 2023-03-21 DIAGNOSIS — Z951 Presence of aortocoronary bypass graft: Secondary | ICD-10-CM | POA: Diagnosis not present

## 2023-03-21 DIAGNOSIS — N1831 Chronic kidney disease, stage 3a: Secondary | ICD-10-CM | POA: Diagnosis not present

## 2023-03-21 DIAGNOSIS — Z95810 Presence of automatic (implantable) cardiac defibrillator: Secondary | ICD-10-CM | POA: Diagnosis not present

## 2023-03-21 DIAGNOSIS — S72001A Fracture of unspecified part of neck of right femur, initial encounter for closed fracture: Secondary | ICD-10-CM | POA: Diagnosis not present

## 2023-03-21 DIAGNOSIS — I255 Ischemic cardiomyopathy: Secondary | ICD-10-CM | POA: Diagnosis not present

## 2023-03-21 LAB — BASIC METABOLIC PANEL
Anion gap: 10 (ref 5–15)
BUN: 33 mg/dL — ABNORMAL HIGH (ref 6–20)
CO2: 23 mmol/L (ref 22–32)
Calcium: 8 mg/dL — ABNORMAL LOW (ref 8.9–10.3)
Chloride: 99 mmol/L (ref 98–111)
Creatinine, Ser: 2.01 mg/dL — ABNORMAL HIGH (ref 0.61–1.24)
GFR, Estimated: 40 mL/min — ABNORMAL LOW (ref >60)
Glucose, Bld: 205 mg/dL — ABNORMAL HIGH (ref 70–99)
Potassium: 4.7 mmol/L (ref 3.5–5.1)
Sodium: 132 mmol/L — ABNORMAL LOW (ref 135–145)

## 2023-03-21 NOTE — Progress Notes (Addendum)
PROGRESS NOTE    Adam James  WUJ:[id number] DOB: 28-Sep-1970 DOA: 03/19/2023 PCP: Hoy Register, MD   Brief Narrative:  Patient is a 52 year old Caucasian male with a history of type 2 diabetes, ischemic cardiomyopathy status post AICD in July 2024, history of left below the knee amputation, chronic systolic heart failure EF of 25%, CKD stage IIIa baseline creatinine 1.6-1.8, hyperlipidemia, coronary disease status post CABG presents to the ER today after a fall.  Imaging notable for right femoral neck fracture.  Hospitalist called for admission, orthopedics, Dr. Magnus Ivan, called in consult.  Assessment & Plan:   Principal Problem:   Fracture of femoral neck, right, closed (HCC) Active Problems:   Ischemic cardiomyopathy with implantable cardioverter-defibrillator (ICD)   Type 2 diabetes mellitus (HCC)   Dyslipidemia   Chronic combined systolic (congestive) and diastolic (congestive) heart failure (HCC) - LVEF 25%   S/P CABG x 3   Below-knee amputation of left lower extremity (HCC)   S/P ICD (internal cardiac defibrillator) procedure - Abbott GALLANT VR Serial [id number]. MRI Compatible   CKD stage 3a, GFR 45-59 ml/min (HCC) - baseline SCr 1.6-1.8   Closed subcapital fracture of femur, right, initial encounter (HCC)   Preop cardiovascular exam   S/P BKA (below knee amputation) unilateral, left (HCC)   Fracture of femoral neck, right, closed (HCC) Ambulatory dysfunction, acute -Orthopedic surgery following, appreciate insight and recommendations -ORIF 8/24 - total hip arthroplasty  -Plan for aspirin 81 twice daily as DVT prophylaxis per orthopedic surgery -Pain control ongoing, postoperative PT OT evaluation pending surgical clearance   CKD stage 3a, GFR 45-59 ml/min (HCC) - baseline SCr 1.6-1.8 Stable -at baseline   S/P ICD (internal cardiac defibrillator) procedure - Abbott GALLANT VR Serial [id number]. MRI Compatible Stable. AICD placed 02-24-2023.   Below-knee  amputation of left lower extremity (HCC) Chronic. Pt has left LE prosthesis.   S/P CABG x 3 Stable.   Chronic combined systolic (congestive) and diastolic (congestive) heart failure (HCC) - LVEF 25% Stable. Euvolemic.   Dyslipidemia Continue crestor.   Type 2 diabetes mellitus (HCC) Continue sliding scale, hypoglycemic protocol  Diabetic diet   Ischemic cardiomyopathy with implantable cardioverter-defibrillator (ICD) Stable. No chest pain.   DVT prophylaxis: SCDs Start: 03/20/23 1150 SCDs Start: 03/19/23 1618 aspirin 81 mg twice daily Code Status:   Code Status: Full Code Family Communication: None present  Status is: Inpatient  Dispo: The patient is from: Home              Anticipated d/c is to: To be determined              Anticipated d/c date is: 48 to 72 hours              Patient currently not medically stable for discharge  Consultants:  Orthopedics  Procedures:  ORIF right femur fracture 8/24  Antimicrobials:  Perioperatively  Subjective: No acute issues or events overnight denies nausea vomiting diarrhea constipation headache fevers chills or chest pain  Objective: Vitals:   03/20/23 1703 03/20/23 2115 03/21/23 0550 03/21/23 0730  BP: (!) 167/96 117/79 114/74 128/74  Pulse: 86 84  84  Resp:  12 10   Temp:  97.9 F (36.6 C) 98.8 F (37.1 C) 97.8 F (36.6 C)  TempSrc:  Oral Oral Oral  SpO2:  92% 92% 94%  Weight:   112.6 kg   Height:        Intake/Output Summary (Last 24 hours) at 03/21/2023 0735 Last data filed at 03/20/2023 1500 Gross  per 24 hour  Intake 937 ml  Output 550 ml  Net 387 ml   Filed Weights   03/19/23 1247 03/21/23 0550  Weight: 99 kg 112.6 kg    Examination:  General exam: Appears calm and comfortable  Respiratory system: Clear to auscultation. Respiratory effort normal. Cardiovascular system: S1 & S2 heard, RRR. No JVD, murmurs, rubs, gallops or clicks. No pedal edema. Gastrointestinal system: Abdomen is nondistended,  soft and nontender. No organomegaly or masses felt. Normal bowel sounds heard. Central nervous system: Alert and oriented. No focal neurological deficits. Extremities: Left BKA Skin: No rashes, lesions or ulcers  Data Reviewed: I have personally reviewed following labs and imaging studies  GFR: Estimated Creatinine Clearance: 59.5 mL/min (A) (by C-G formula based on SCr of 2.01 mg/dL (H)).  Scheduled Meds:  amiodarone  200 mg Oral Daily   aspirin EC  81 mg Oral BID   carvedilol  3.125 mg Oral BID WC   digoxin  0.125 mg Oral Daily   docusate sodium  100 mg Oral BID   gabapentin  200 mg Oral QHS   glipiZIDE  5 mg Oral QAC breakfast   rosuvastatin  20 mg Oral Daily   spironolactone  25 mg Oral Daily   venlafaxine XR  75 mg Oral Q breakfast   Continuous Infusions:  sodium chloride 75 mL/hr at 03/21/23 0306     LOS: 2 days   Time spent:  Azucena Fallen, DO [facility]  If 7PM-7AM, please contact night-coverage [email or web address]  03/21/2023, 7:35 AM

## 2023-03-21 NOTE — Evaluation (Signed)
Occupational Therapy Evaluation Patient Details Name: Adam James MRN: [id number] DOB: 02/16/1971 Today's Date: 03/21/2023   History of Present Illness Pt. is 52 yo male who presents on 03/19/23 after falling when L knee gave way and sustaining R femoral neck fx. Underwent R anterior direct THA 03/20/23.  PMH: CHF with EF of 25%, DM II, MI, OM, CABG, CKD, HTN, L BKA, defib placement, fall 03/10/23 with R clavicle fx and injury to L residual limb   Clinical Impression   Adam James was evaluated s/p the above admission list. Per his report, he is mod I at baseline with use of WC or RW to mobilize. Upon evaluation the pt was limited by anxiety, pain, self-limiting behaviors and decreased activity tolerance. Overall he was CGA for bed mobility and supervision A for sitting EOB balance, however he declined any attempt for OOB or standing transfer despite having +2 assist. Due to the deficits listed below the pt also needs set up A for bed level ADLs and anticipate up to mod A needed for OOB toileting transfer once pt is agreeable. Pt will benefit from continued acute OT services and skilled inpatient follow up therapy, <3 hours/day due to fall history and safety.        If plan is discharge home, recommend the following: A lot of help with walking and/or transfers;A little help with bathing/dressing/bathroom;Assistance with cooking/housework;Assist for transportation;Help with stairs or ramp for entrance    Functional Status Assessment  Patient has had a recent decline in their functional status and demonstrates the ability to make significant improvements in function in a reasonable and predictable amount of time.  Equipment Recommendations  Other (comment) (defer)       Precautions / Restrictions Precautions Precautions: Fall Required Braces or Orthoses: Sling Restrictions Weight Bearing Restrictions: No RUE Weight Bearing: Weight bearing as tolerated (Blackman's note 8/25) RLE Weight  Bearing: Weight bearing as tolerated      Mobility Bed Mobility Overal bed mobility: Needs Assistance Bed Mobility: Supine to Sit     Supine to sit: Contact guard          Transfers Overall transfer level: Needs assistance                 General transfer comment: pt refused.      Balance Overall balance assessment: Needs assistance Sitting-balance support: No upper extremity supported, Feet supported Sitting balance-[name] Scale: Good         Standing balance comment: unable                           ADL either performed or assessed with clinical judgement   ADL Overall ADL's : Needs assistance/impaired Eating/Feeding: Independent   Grooming: Set up;Sitting   Upper Body Bathing: Set up;Sitting   Lower Body Bathing: Set up;Sit to/from stand   Upper Body Dressing : Set up;Sitting   Lower Body Dressing: Set up;Bed level   Toilet Transfer: Moderate assistance;+2 for physical assistance;+2 for safety/equipment Toilet Transfer Details (indicate cue type and reason): unable to assess as pt refused OOB or standing attempt. Toileting- Clothing Manipulation and Hygiene: Supervision/safety;Sitting/lateral lean       Functional mobility during ADLs: Contact guard assist (at bed level) General ADL Comments: pt adamantly refusing to get OOB - he is able to complete ADLs at bed level with set up A and increased time     Vision Baseline Vision/History: 0 No visual deficits Vision Assessment?: No apparent visual deficits  Perception Perception: Not tested       Praxis Praxis: Not tested       Pertinent Vitals/Pain Pain Assessment Pain Assessment: No/denies pain     Extremity/Trunk Assessment Upper Extremity Assessment Upper Extremity Assessment: RUE deficits/detail RUE Deficits / Details: 8/14 fall with broken clavicle; pt reports he is NWB however he is tolerated ROM and partial WBing throughout session. Sling present pt not donned RUE  Sensation: WNL RUE Coordination: decreased gross motor   Lower Extremity Assessment Lower Extremity Assessment: Defer to PT evaluation   Cervical / Trunk Assessment Cervical / Trunk Assessment: Normal   Communication Communication Communication: No apparent difficulties   Cognition Arousal: Alert Behavior During Therapy: Anxious Overall Cognitive Status: No family/caregiver present to determine baseline cognitive functioning                                 General Comments: Overall WFL. Pt is extremely particular on how things are donw and unable to think flexibly to mobilize/complete ADLs in different ways     General Comments  VSS on RA            Home Living Family/patient expects to be discharged to:: Private residence Living Arrangements: Children Available Help at Discharge: Family;Available PRN/intermittently Type of Home: Mobile home Home Access: Ramped entrance     Home Layout: One level     Bathroom Shower/Tub: Chief Strategy Officer: Standard Bathroom Accessibility: Yes   Home Equipment: Agricultural consultant (2 wheels);Wheelchair - manual   Additional Comments: lives with 84 yo daughter who works all different shifts, alone when she's gone      Prior Functioning/Environment Prior Level of Function : Independent/Modified Independent             Mobility Comments: independent before fall, except does not drive. WC vs RW in home ADLs Comments: says he is generally mod I        OT Problem List: Decreased strength;Decreased range of motion;Decreased activity tolerance;Impaired balance (sitting and/or standing);Decreased safety awareness;Decreased knowledge of use of DME or AE;Cardiopulmonary status limiting activity      OT Treatment/Interventions: Self-care/ADL training;Neuromuscular education;DME and/or AE instruction;Therapeutic activities;Patient/family education;Balance training    OT Goals(Current goals can be found in  the care plan section) Acute Rehab OT Goals Patient Stated Goal: to be left alone OT Goal Formulation: With patient Time For Goal Achievement: 04/04/23 Potential to Achieve Goals: Good ADL Goals Pt Will Perform Lower Body Dressing: with set-up;sit to/from stand Pt Will Transfer to Toilet: with supervision;ambulating Additional ADL Goal #1: Pt will tolerate at least 5 minutes of OOB activity with prosthetic donned in preparation for ADLs  OT Frequency: Min 1X/week    Co-evaluation PT/OT/SLP Co-Evaluation/Treatment: Yes Reason for Co-Treatment: Complexity of the patient's impairments (multi-system involvement);For patient/therapist safety;To address functional/ADL transfers   OT goals addressed during session: ADL's and self-care      AM-PAC OT "6 Clicks" Daily Activity     Outcome Measure Help from another person eating meals?: None Help from another person taking care of personal grooming?: A Little Help from another person toileting, which includes using toliet, bedpan, or urinal?: A Lot Help from another person bathing (including washing, rinsing, drying)?: A Little Help from another person to put on and taking off regular upper body clothing?: A Little Help from another person to put on and taking off regular lower body clothing?: A Little 6 Click Score: 18  End of Session Nurse Communication: Mobility status  Activity Tolerance: Patient tolerated treatment well Patient left: in bed;with bed alarm set  OT Visit Diagnosis: Unsteadiness on feet (R26.81);Other abnormalities of gait and mobility (R26.89);Muscle weakness (generalized) (M62.81);History of falling (Z91.81);Repeated falls (R29.6);Pain                Time: 7253-6644 OT Time Calculation (min): 28 min Charges:  OT General Charges $OT Visit: 1 Visit OT Evaluation $OT Eval Moderate Complexity: 1 Mod  Derenda Mis, OTR/L Acute Rehabilitation Services Office [phone] Secure Chat Communication  Preferred   Donia Pounds 03/21/2023, 9:38 AM

## 2023-03-21 NOTE — Progress Notes (Signed)
Rounding Note    Patient Name: Adam James Date of Encounter: 03/21/2023  [facility] Cardiologist: Norman Herrlich, MD   Subjective   Underwent hip surgery yesterday, doing well. Denies any chest pain or dyspnea  Inpatient Medications    Scheduled Meds:  amiodarone  200 mg Oral Daily   aspirin EC  81 mg Oral BID   carvedilol  3.125 mg Oral BID WC   digoxin  0.125 mg Oral Daily   docusate sodium  100 mg Oral BID   gabapentin  200 mg Oral QHS   glipiZIDE  5 mg Oral QAC breakfast   rosuvastatin  20 mg Oral Daily   spironolactone  25 mg Oral Daily   venlafaxine XR  75 mg Oral Q breakfast   Continuous Infusions:  sodium chloride Stopped (03/21/23 0813)   PRN Meds: acetaminophen **OR** acetaminophen, diphenhydrAMINE, HYDROmorphone (DILAUDID) injection, HYDROmorphone, melatonin, menthol-cetylpyridinium **OR** phenol, metoCLOPramide **OR** metoCLOPramide (REGLAN) injection, ondansetron **OR** ondansetron (ZOFRAN) IV, oxyCODONE, oxyCODONE, tiZANidine   Vital Signs    Vitals:   03/20/23 1703 03/20/23 2115 03/21/23 0550 03/21/23 0730  BP: (!) 167/96 117/79 114/74 128/74  Pulse: 86 84  84  Resp:  12 10   Temp:  97.9 F (36.6 C) 98.8 F (37.1 C) 97.8 F (36.6 C)  TempSrc:  Oral Oral Oral  SpO2:  92% 92% 94%  Weight:   112.6 kg   Height:        Intake/Output Summary (Last 24 hours) at 03/21/2023 1135 Last data filed at 03/21/2023 0813 Gross per 24 hour  Intake 477 ml  Output 950 ml  Net -473 ml      03/21/2023    5:50 AM 03/19/2023   12:47 PM 02/24/2023    6:39 AM  Last 3 Weights  Weight (lbs) 248 lb 3.8 oz 218 lb 4.1 oz 220 lb  Weight (kg) 112.6 kg 99 kg 99.791 kg      Telemetry    NSR - Personally Reviewed  ECG     - Personally Reviewed  Physical Exam   GEN: No acute distress.   Neck: No JVD Cardiac: RRR, no murmurs, rubs, or gallops.  Respiratory: Clear to auscultation bilaterally. GI: Soft, nontender, non-distended  MS: No edema; s/p  L BKA Neuro:  Nonfocal  Psych: Normal affect   Labs    High Sensitivity Troponin:  No results for input(s): "TROPONINIHS" in the last 720 hours.   Chemistry Recent Labs  Lab 03/19/23 1516 03/20/23 0345 03/20/23 1324 03/21/23 0443  NA 136 132* 135 132*  K 4.4 4.2 4.6 4.7  CL 102 99 99 99  CO2 24 22 21* 23  GLUCOSE 132* 127* 155* 205*  BUN 28* 27* 29* 33*  CREATININE 1.76* 1.61* 1.82* 2.01*  CALCIUM 8.7* 8.6* 8.9 8.0*  MG  --  2.3  --   --   PROT 6.8  --   --   --   ALBUMIN 3.4*  --   --   --   AST 25  --   --   --   ALT 25  --   --   --   ALKPHOS 111  --   --   --   BILITOT 1.1  --   --   --   GFRNONAA 47* 52* 45* 40*  ANIONGAP 10 11 15 10     Lipids No results for input(s): "CHOL", "TRIG", "HDL", "LABVLDL", "LDLCALC", "CHOLHDL" in the last 168 hours.  Hematology Recent Labs  Lab 03/19/23 1516 03/20/23 0345 03/20/23 1324  WBC 13.1* 10.6* 15.2*  RBC 4.36 4.30 4.16*  HGB 12.9* 12.9* 12.6*  HCT 39.5 39.5 38.5*  MCV 90.6 91.9 92.5  MCH 29.6 30.0 30.3  MCHC 32.7 32.7 32.7  RDW 13.0 13.2 13.1  PLT 275 254 240   Thyroid No results for input(s): "TSH", "FREET4" in the last 168 hours.  BNPNo results for input(s): "BNP", "PROBNP" in the last 168 hours.  DDimer No results for input(s): "DDIMER" in the last 168 hours.   Radiology    DG HIP UNILAT WITH PELVIS 2-3 VIEWS RIGHT  Result Date: 03/20/2023 CLINICAL DATA:  Elective surgery, right hip arthroplasty. EXAM: DG HIP (WITH OR WITHOUT PELVIS) 2-3V RIGHT COMPARISON:  Preoperative exam FINDINGS: Eight fluoroscopic spot views of the pelvis and right hip obtained in the operating room. Sequential images during hip arthroplasty. Fluoroscopy time 16.4 seconds. Dose 2.5 mGy. IMPRESSION: Intraoperative fluoroscopy during right hip arthroplasty. Electronically Signed   By: Narda Rutherford M.D.   On: 03/20/2023 12:38   DG Pelvis Portable  Result Date: 03/20/2023 CLINICAL DATA:  Postop right hip replacement. EXAM: PORTABLE PELVIS  1-2 VIEWS COMPARISON:  Preoperative exam FINDINGS: Right hip arthroplasty in expected alignment. No periprosthetic lucency or fracture. Recent postsurgical change includes air and edema in the soft tissues. IMPRESSION: Right hip arthroplasty without immediate postoperative complication. Electronically Signed   By: Narda Rutherford M.D.   On: 03/20/2023 12:37   DG C-Arm 1-60 Min-No Report  Result Date: 03/20/2023 Fluoroscopy was utilized by the requesting physician.  No radiographic interpretation.   DG C-Arm 1-60 Min-No Report  Result Date: 03/20/2023 Fluoroscopy was utilized by the requesting physician.  No radiographic interpretation.   DG Chest Port 1 View  Result Date: 03/19/2023 CLINICAL DATA:  Fall at home, hip fracture. In a separate recent fall the patient fractured his right clavicle, treated by Dr. Lajoyce Corners. EXAM: PORTABLE CHEST 1 VIEW COMPARISON:  02/24/2023 FINDINGS: Interval fracture the right distal clavicle with mild comminution. AICD noted.  Prior CABG.  Heart size within normal limits. Old right lateral fourth and fifth rib deformities from healed fractures. The lungs appear clear.  Costophrenic angles are both excluded. IMPRESSION: 1. Interval acute fracture of the right distal clavicle with mild comminution. 2. Old healed right lateral fourth and fifth rib fractures. 3. Prior CABG and AICD. Electronically Signed   By: Gaylyn Rong M.D.   On: 03/19/2023 17:40   DG Hip Port Unilat W or Wo Pelvis 1 View Right  Result Date: 03/19/2023 CLINICAL DATA:  fall EXAM: DG HIP (WITH OR WITHOUT PELVIS) 1V PORT RIGHT COMPARISON:  None Available. FINDINGS: Suboptimal exam. Mildly displaced and angulated subcapital fracture of right femoral neck noted. No other acute fracture or dislocation. No aggressive osseous lesion. Normal and symmetric sacroiliac joints. Visualized sacral arcuate lines are unremarkable. Unremarkable symphysis pubis. There are mild degenerative changes of the right hip joint  without significant joint space narrowing. Osteophytosis of the superior acetabulum. No radiopaque foreign bodies. IMPRESSION: 1. Mildly displaced and angulated subcapital fracture of the right femoral neck. Electronically Signed   By: Jules Schick M.D.   On: 03/19/2023 14:52    Cardiac Studies     Patient Profile     52 y.o. 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  Appears euvolemic.   -Continue carvedilol 3.125 mg twice daily -Continue digoxin 0.125 mg daily -Continue spironolactone 25 mg daily -Restart Jardiance 10 mg daily once stable renal function -Reports was not taking entresto, can add back as outpatient if stable renal function  VT: continue amio  S/p ICD: Implanted 02/24/2023.  Right hip fracture: Status post surgery 8/24  CKD stage IIIa: Creatinine mildly worsened (2.1) today, will monitor  For questions or updates, please contact [facility] Please consult [email or web address] for contact info under        Signed, Little Ishikawa, MD  03/21/2023, 11:35 AM

## 2023-03-21 NOTE — Progress Notes (Signed)
Patient ID: Adam James, male   DOB: [birth year], 52 y.o.   MRN: [id number] The patient is awake and alert this morning.  He tolerated surgery well yesterday on his right hip.  We performed a right direct anterior total hip arthroplasty.  His right hip dressing is clean and dry.  He does have a clavicle fracture on his right side from a previous fall.  His biggest complaint is his left below-knee amputation stump and the pain he has had and that working with his prosthesis.  That will likely need to be assessed as an outpatient with the prosthetic specialist and further studies given his recent falling due to that left BKA stump and the pain he is experiencing with it.  From the standpoint of his right hip, he can mobilize with therapy with weightbearing as tolerated.  He can put weight through his right shoulder as well.  From a DVT coverage standpoint, I usually only have patients take an 81 mg aspirin twice daily.  We will follow him while he is still here.

## 2023-03-21 NOTE — Discharge Instructions (Signed)

## 2023-03-21 NOTE — Plan of Care (Signed)
  Problem: Health Behavior/Discharge Planning: Goal: Ability to manage health-related needs will improve Outcome: Progressing   Problem: Activity: Goal: Risk for activity intolerance will decrease Outcome: Progressing   Problem: Pain Managment: Goal: General experience of comfort will improve Outcome: Progressing   

## 2023-03-21 NOTE — Progress Notes (Signed)
Physical Therapy Treatment Patient Details Name: Adam James MRN: [id number] DOB: 1971/06/08 Today's Date: 03/21/2023   History of Present Illness Pt. is 52 yo male who presents on 03/19/23 after falling when L knee gave way and sustaining R femoral neck fx. Underwent R anterior direct THA 03/20/23.  PMH: CHF with EF of 25%, DM II, MI, OM, CABG, CKD, HTN, L BKA, defib placement, fall 03/10/23 with R clavicle fx and injury to L residual limb    PT Comments  Pt received in bed. He required CGA supine to sit and demo good sitting balance. Pt performed LE exercises seated EOB. Pt declining standing or OOB attempts, wanting the gel sleeve for his prosthesis washed prior to donning. Gel sleeve washed by therapist but needing time to dry. Drop-arm recliner retrieved for room but pt still declined.  Pt remained sitting EOB at end of session.    If plan is discharge home, recommend the following: Two people to help with walking and/or transfers;A lot of help with bathing/dressing/bathroom;Assist for transportation;Help with stairs or ramp for entrance;Assistance with cooking/housework   Can travel by private vehicle     No  Equipment Recommendations  Other (comment) (tub bench)    Recommendations for Other Services       Precautions / Restrictions Precautions Precautions: Fall;Other (comment) Precaution Comments: h/o L BKA, prosthesis in room Required Braces or Orthoses: Sling Restrictions Weight Bearing Restrictions: No RUE Weight Bearing: Weight bearing as tolerated (per ortho note 8/25) RLE Weight Bearing: Weight bearing as tolerated     Mobility  Bed Mobility Overal bed mobility: Needs Assistance Bed Mobility: Supine to Sit     Supine to sit: Contact guard, HOB elevated, Used rails     General bed mobility comments: increased time. Declining physical assist.    Transfers                   General transfer comment: Pt refused.    Ambulation/Gait                    Stairs             Wheelchair Mobility     Tilt Bed    Modified Rankin (Stroke Patients Only)       Balance Overall balance assessment: Needs assistance Sitting-balance support: No upper extremity supported, Feet supported Sitting balance-[name] Scale: Good                                      Cognition Arousal: Alert Behavior During Therapy: Anxious Overall Cognitive Status: No family/caregiver present to determine baseline cognitive functioning                                 General Comments: overall WFL. Extremely particular on how things are done. Prefers to direct session.        Exercises General Exercises - Lower Extremity Ankle Circles/Pumps: AROM, Right, 10 reps, Seated Long Arc Quad: AROM, Right, 10 reps, Seated    General Comments General comments (skin integrity, edema, etc.): VSS on RA      Pertinent Vitals/Pain Pain Assessment Pain Assessment: Faces Faces Pain Scale: Hurts a little bit Pain Location: R hip Pain Descriptors / Indicators: Discomfort, Grimacing Pain Intervention(s): Premedicated before session, Limited activity within patient's tolerance    Home Living Family/patient expects to be  discharged to:: Private residence Living Arrangements: Children Available Help at Discharge: Family;Available PRN/intermittently Type of Home: Mobile home Home Access: Ramped entrance       Home Layout: One level Home Equipment: Agricultural consultant (2 wheels);Wheelchair - manual Additional Comments: lives with 44 yo daughter who works all different shifts, alone when she's gone    Prior Function            PT Goals (current goals can now be found in the care plan section) Acute Rehab PT Goals Patient Stated Goal: return home Progress towards PT goals: Progressing toward goals    Frequency    Min 1X/week      PT Plan      Co-evaluation PT/OT/SLP Co-Evaluation/Treatment: Yes Reason for  Co-Treatment: Complexity of the patient's impairments (multi-system involvement);For patient/therapist safety;To address functional/ADL transfers PT goals addressed during session: Mobility/safety with mobility;Proper use of DME OT goals addressed during session: ADL's and self-care      AM-PAC PT "6 Clicks" Mobility   Outcome Measure  Help needed turning from your back to your side while in a flat bed without using bedrails?: A Little Help needed moving from lying on your back to sitting on the side of a flat bed without using bedrails?: A Little Help needed moving to and from a bed to a chair (including a wheelchair)?: Total Help needed standing up from a chair using your arms (e.g., wheelchair or bedside chair)?: Total Help needed to walk in hospital room?: Total Help needed climbing 3-5 steps with a railing? : Total 6 Click Score: 10    End of Session Equipment Utilized During Treatment: Gait belt Activity Tolerance: Patient tolerated treatment well Patient left: in bed;with call bell/phone within reach;with bed alarm set (sitting EOB per pt request) Nurse Communication: Mobility status PT Visit Diagnosis: Unsteadiness on feet (R26.81);History of falling (Z91.81);Pain;Difficulty in walking, not elsewhere classified (R26.2) Pain - Right/Left: Right Pain - part of body: Hip     Time: 0815-0839 PT Time Calculation (min) (ACUTE ONLY): 24 min  Charges:    $Therapeutic Activity: 8-22 mins PT General Charges $$ ACUTE PT VISIT: 1 Visit                     Ferd Glassing., PT  Office # [phone]    Ilda Foil 03/21/2023, 10:18 AM

## 2023-03-21 NOTE — TOC CAGE-AID Note (Signed)
Transition of Care [facility]) - CAGE-AID Screening   Patient Details  Name: Adam James MRN: [id number] Date of Birth: 06-27-1971   Hewitt Shorts, RN Trauma Response Nurse Phone Number: [phone] 03/21/2023, 2:39 PM       CAGE-AID Screening:    Have You Ever Felt You Ought to Cut Down on Your Drinking or Drug Use?: No Have People Annoyed You By Critizing Your Drinking Or Drug Use?: No Have You Felt Bad Or Guilty About Your Drinking Or Drug Use?: No Have You Ever Had a Drink or Used Drugs First Thing In The Morning to Steady Your Nerves or to Get Rid of a Hangover?: No CAGE-AID Score: 0  Substance Abuse Education Offered: No (Pt does not endorse any alcohol or drug use)

## 2023-03-22 ENCOUNTER — Encounter (HOSPITAL_COMMUNITY): Payer: Self-pay | Admitting: Orthopaedic Surgery

## 2023-03-22 DIAGNOSIS — I5022 Chronic systolic (congestive) heart failure: Secondary | ICD-10-CM | POA: Diagnosis not present

## 2023-03-22 DIAGNOSIS — I5042 Chronic combined systolic (congestive) and diastolic (congestive) heart failure: Secondary | ICD-10-CM | POA: Diagnosis not present

## 2023-03-22 LAB — BASIC METABOLIC PANEL
Anion gap: 11 (ref 5–15)
BUN: 45 mg/dL — ABNORMAL HIGH (ref 6–20)
CO2: 22 mmol/L (ref 22–32)
Calcium: 8.3 mg/dL — ABNORMAL LOW (ref 8.9–10.3)
Chloride: 97 mmol/L — ABNORMAL LOW (ref 98–111)
Creatinine, Ser: 2.31 mg/dL — ABNORMAL HIGH (ref 0.61–1.24)
GFR, Estimated: 34 mL/min — ABNORMAL LOW (ref >60)
Glucose, Bld: 156 mg/dL — ABNORMAL HIGH (ref 70–99)
Potassium: 4.5 mmol/L (ref 3.5–5.1)
Sodium: 130 mmol/L — ABNORMAL LOW (ref 135–145)

## 2023-03-22 LAB — CBC
HCT: 33.1 % — ABNORMAL LOW (ref 39.0–52.0)
Hemoglobin: 10.9 g/dL — ABNORMAL LOW (ref 13.0–17.0)
MCH: 29.5 pg (ref 26.0–34.0)
MCHC: 32.9 g/dL (ref 30.0–36.0)
MCV: 89.7 fL (ref 80.0–100.0)
Platelets: 260 10*3/uL (ref 150–400)
RBC: 3.69 MIL/uL — ABNORMAL LOW (ref 4.22–5.81)
RDW: 13.2 % (ref 11.5–15.5)
WBC: 11.8 10*3/uL — ABNORMAL HIGH (ref 4.0–10.5)
nRBC: 0 % (ref 0.0–0.2)

## 2023-03-22 LAB — GLUCOSE, CAPILLARY: Glucose-Capillary: 178 mg/dL — ABNORMAL HIGH (ref 70–99)

## 2023-03-22 NOTE — Progress Notes (Signed)
PROGRESS NOTE    Adam James  ZOX:[id number] DOB: [birth year] DOA: 03/19/2023 PCP: Hoy Register, MD  Brief Narrative:  Patient is a 52 year old Caucasian male with a history of type 2 diabetes, ischemic cardiomyopathy status post AICD in July 2024, history of left below the knee amputation, chronic systolic heart failure EF of 25%, CKD stage IIIa baseline creatinine 1.6-1.8, hyperlipidemia, coronary disease status post CABG presents to the ER today after a fall.  Imaging notable for right femoral neck fracture.  Hospitalist called for admission, orthopedics, Dr. Magnus Ivan, called in consult.  Patient tolerated procedure well, continue to advance with PT - likely dispo home in the next 24-48h pending stability/strength to ensure safe discharge.  Assessment & Plan:   Principal Problem:   Fracture of femoral neck, right, closed (HCC) Active Problems:   Ischemic cardiomyopathy with implantable cardioverter-defibrillator (ICD)   Type 2 diabetes mellitus (HCC)   Dyslipidemia   Chronic combined systolic (congestive) and diastolic (congestive) heart failure (HCC) - LVEF 25%   S/P CABG x 3   Below-knee amputation of left lower extremity (HCC)   S/P ICD (internal cardiac defibrillator) procedure - Abbott GALLANT VR Serial [id number]. MRI Compatible   CKD stage 3a, GFR 45-59 ml/min (HCC) - baseline SCr 1.6-1.8   Closed subcapital fracture of femur, right, initial encounter (HCC)   Preop cardiovascular exam   S/P BKA (below knee amputation) unilateral, left (HCC)  Fracture of femoral neck, right, closed (HCC) Ambulatory dysfunction, acute - Orthopedic surgery following, appreciate insight and recommendations - ORIF 8/24 - total hip arthroplasty  - Plan for aspirin 81 twice daily as DVT prophylaxis per orthopedic surgery - Pain control ongoing, postoperative PT OT evaluation pending surgical clearance   CKD stage 3a, GFR 45-59 ml/min (HCC) - baseline SCr 1.6-1.8 Stable -at baseline    S/P ICD (internal cardiac defibrillator) procedure - Abbott GALLANT VR Serial [id number]. MRI Compatible Stable. AICD placed 02-24-2023.   Below-knee amputation of left lower extremity (HCC) Chronic. Pt has left LE prosthesis.   S/P CABG x 3 Stable.   Chronic combined systolic (congestive) and diastolic (congestive) heart failure (HCC) - LVEF 25% Stable. Euvolemic.   Dyslipidemia Continue crestor.   Type 2 diabetes mellitus (HCC) Continue sliding scale, hypoglycemic protocol  Diabetic diet   Ischemic cardiomyopathy with implantable cardioverter-defibrillator (ICD) Stable. No chest pain.   DVT prophylaxis: SCDs Start: 03/20/23 1150 SCDs Start: 03/19/23 1618 aspirin 81 mg twice daily Code Status:   Code Status: Full Code Family Communication: None present  Status is: Inpatient  Dispo: The patient is from: Home              Anticipated d/c is to: Home with HHPT              Anticipated d/c date is: imminent pending safe disposition location              Patient currently IS medically stable for discharge  Consultants:  Orthopedics  Procedures:  ORIF right femur fracture 8/24  Antimicrobials:  Perioperatively  Subjective: No acute issues or events overnight denies nausea vomiting diarrhea constipation headache fevers chills or chest pain  Objective: Vitals:   03/21/23 1338 03/21/23 1635 03/21/23 2039 03/22/23 0402  BP: (!) 133/91 (!) 147/91 (!) 132/92 (!) 150/85  Pulse: 85 84 83   Resp: 19   16  Temp: 98.1 F (36.7 C)  98.3 F (36.8 C) 98.4 F (36.9 C)  TempSrc: Oral  Oral Oral  SpO2: 95%  93% 94%  Weight:      Height:        Intake/Output Summary (Last 24 hours) at 03/22/2023 0726 Last data filed at 03/21/2023 1800 Gross per 24 hour  Intake 240 ml  Output 2000 ml  Net -1760 ml   Filed Weights   03/19/23 1247 03/21/23 0550  Weight: 99 kg 112.6 kg    Examination:  General exam: Appears calm and comfortable  Respiratory system: Clear to  auscultation. Respiratory effort normal. Cardiovascular system: S1 & S2 heard, RRR. No JVD, murmurs, rubs, gallops or clicks. No pedal edema. Gastrointestinal system: Abdomen is nondistended, soft and nontender. No organomegaly or masses felt. Normal bowel sounds heard. Central nervous system: Alert and oriented. No focal neurological deficits. Extremities: Left BKA Skin: No rashes, lesions or ulcers  Data Reviewed: I have personally reviewed following labs and imaging studies  GFR: Estimated Creatinine Clearance: 51.8 mL/min (A) (by C-G formula based on SCr of 2.31 mg/dL (H)).  Scheduled Meds:  amiodarone  200 mg Oral Daily   aspirin EC  81 mg Oral BID   carvedilol  3.125 mg Oral BID WC   digoxin  0.125 mg Oral Daily   docusate sodium  100 mg Oral BID   gabapentin  200 mg Oral QHS   glipiZIDE  5 mg Oral QAC breakfast   rosuvastatin  20 mg Oral Daily   venlafaxine XR  75 mg Oral Q breakfast   Continuous Infusions:  sodium chloride Stopped (03/21/23 0813)     LOS: 3 days   Time spent:  Azucena Fallen, DO [facility]  If 7PM-7AM, please contact night-coverage [email or web address]  03/22/2023, 7:26 AM

## 2023-03-22 NOTE — Progress Notes (Addendum)
   Patient Name: Adam James Date of Encounter: 03/22/2023 [facility] Cardiologist: Norman Herrlich, MD   Interval Summary  .    52 y.o. male with a hx of CAD s/p PCI 2007 in [place], CABG 04/2021, ICM/chronic HFrEF, frequent PVCs/VT treated with amiodarone, Abbott ICD 01/2023, anemia, CKD 3a, GIB with erosive esophagitis in 2022, dyslipidemia, HTN, DM, necrotizing fasciitis, L BKA 07/2021, former tobacco abuse now admitted with mechanical fall and Rt hip fx.  Also fall the week before with injury to Rt clavicle.   Was cleared for surgery 03/19/23 and underwent Rt ant total hip arthoplasty 03/20/23.    Today on side of bed to ambulate no chest pain or SOB.     Vital Signs .    Vitals:   03/21/23 1635 03/21/23 2039 03/22/23 0402 03/22/23 0809  BP: (!) 147/91 (!) 132/92 (!) 150/85 (!) 143/86  Pulse: 84 83  92  Resp:   16   Temp:  98.3 F (36.8 C) 98.4 F (36.9 C) 98.5 F (36.9 C)  TempSrc:  Oral Oral Oral  SpO2:  93% 94% 95%  Weight:      Height:        Intake/Output Summary (Last 24 hours) at 03/22/2023 0846 Last data filed at 03/21/2023 1800 Gross per 24 hour  Intake --  Output 1300 ml  Net -1300 ml      03/21/2023    5:50 AM 03/19/2023   12:47 PM 02/24/2023    6:39 AM  Last 3 Weights  Weight (lbs) 248 lb 3.8 oz 218 lb 4.1 oz 220 lb  Weight (kg) 112.6 kg 99 kg 99.791 kg      Telemetry/ECG    SR - Personally Reviewed  Physical Exam .   GEN: No acute distress.   Neck: No JVD Cardiac: RRR, no murmurs, rubs, or gallops.  Respiratory: Clear to auscultation bilaterally. GI: Soft, nontender, non-distended  MS: No edema Rt leg  Assessment & Plan .     Chronic combined heart failure: EF 25 to 30% 11/2022.   -Appears euvolemic today.  No complaints   -Continue carvedilol 3.125 mg twice daily -Continue digoxin 0.125 mg daily -Continue spironolactone 25 mg daily -Restart Jardiance 10 mg daily once stable renal function -Reports was not taking entresto, can add back  as outpatient if stable renal function  VT: continue amio   Hx ICD: Implanted 02/24/2023.   Right hip fracture: Status post surgery 8/24  per ortho   Hyponatremia 130 today    CKD stage IIIa: Creatinine mildly worsened (2.1) yesterday and today 2.31 , will monitor --? Hold dig  --only on spironolactone  no diuretics and no home diuretics otherwise   For questions or updates, please contact [facility] Please consult [email or web address] for contact info under     Signed, Nada Boozer, NP    History and all data above reviewed.  Patient examined.  I agree with the findings as above.  Denies chest pain or SOB.   The patient exam reveals COR:RRR  ,  Lungs: Clear  ,  Abd: Positive bowel sounds, no rebound no guarding, Ext No edema  .  All available labs, radiology testing, previous records reviewed. Agree with documented assessment and plan.   Chronic systolic and diastolic HF:  Euvolemic.  Discontinue digoxin.  Continue other meds as listed.    Fayrene Fearing Ruthel Martine  11:25 AM  03/22/2023

## 2023-03-22 NOTE — Progress Notes (Signed)
Physical Therapy Treatment Patient Details Name: Adam James MRN: [id number] DOB: Aug 17, 1970 Today's Date: 03/22/2023   History of Present Illness Pt. is 52 yo male who presents on 03/19/23 after falling when L knee gave way and sustaining R femoral neck fx. Underwent R anterior direct THA 03/20/23.  PMH: CHF with EF of 25%, DM II, MI, OM, CABG, CKD, HTN, L BKA, defib placement, fall 03/10/23 with R clavicle fx and injury to L residual limb    PT Comments  Pt agreeable to getting up with therapy now that his prosthetic sleeve is clean. Pt is limited in safe mobility by need for setup of prosthetic to be able to don safely, in presence of increased R hip and clavicle pain, especially with weightbearing. Once pt is set up with his prosthetic he mobilizes with supervision for bed mobility and contact guard for sit to stand and stand step transfer to recliner. Pt declines further ambulation at this time. Pt will benefit from HHPT to continue to progress his mobility. Pt interested in [facility] to assist with iADLs.  PT will continue to follow acutely.   If plan is discharge home, recommend the following: A lot of help with bathing/dressing/bathroom;Assist for transportation;Help with stairs or ramp for entrance;Assistance with cooking/housework;A little help with walking and/or transfers   Can travel by private vehicle     Yes  Equipment Recommendations  Other (comment) (tub bench)    Recommendations for Other Services OT consult     Precautions / Restrictions Precautions Precautions: Fall;Other (comment) Precaution Comments: h/o L BKA, prosthesis in room Required Braces or Orthoses: Sling Restrictions Weight Bearing Restrictions: No RUE Weight Bearing: Weight bearing as tolerated RLE Weight Bearing: Weight bearing as tolerated     Mobility  Bed Mobility Overal bed mobility: Needs Assistance Bed Mobility: Supine to Sit     Supine to sit: HOB elevated, Used rails, Supervision      General bed mobility comments: increased time. Declining physical assist.    Transfers Overall transfer level: Needs assistance Equipment used: Rolling walker (2 wheels) Transfers: Sit to/from Stand, Bed to chair/wheelchair/BSC Sit to Stand: From elevated surface, Contact guard assist   Step pivot transfers: Contact guard assist       General transfer comment: R shoe donned by therapist, increased effort to seat L prosthetic prior to attempt to stand, once prosthetic seated pt able to stand from elevated bed, increased R shoulder pain with UE support on RW, able to march in place x 8 prior to stepping over to recliner    Ambulation/Gait               General Gait Details: agreeable to step transfer to recliner, defers further ambulation at this time.          Balance Overall balance assessment: Needs assistance Sitting-balance support: No upper extremity supported, Feet supported Sitting balance-[name] Scale: Good     Standing balance support: Bilateral upper extremity supported, During functional activity, Reliant on assistive device for balance Standing balance-[name] Scale: Poor Standing balance comment: requires UE support from RW                            Cognition Arousal: Alert Behavior During Therapy: [facility] for tasks assessed/performed Overall Cognitive Status: No family/caregiver present to determine baseline cognitive functioning  General Comments: overall WFL. Extremely particular on how things are done. Prefers to direct session.        Exercises Total Joint Exercises Ankle Circles/Pumps: AROM, Right, 10 reps, Supine Marching in Standing: AROM, Both, 10 reps, Standing General Exercises - Lower Extremity Ankle Circles/Pumps: AROM, Right, 10 reps, Seated Long Arc Quad: AROM, Right, 10 reps, Seated Hip Flexion/Marching: AROM, Both, 10 reps, Seated    General Comments General comments (skin  integrity, edema, etc.): VSS on RA      Pertinent Vitals/Pain Pain Assessment Pain Assessment: Faces Faces Pain Scale: Hurts little more Pain Location: R hip, R clavicle Pain Descriptors / Indicators: Discomfort, Grimacing Pain Intervention(s): Limited activity within patient's tolerance, Monitored during session, Premedicated before session, Repositioned     PT Goals (current goals can now be found in the care plan section) Acute Rehab PT Goals Patient Stated Goal: return home PT Goal Formulation: With patient Time For Goal Achievement: 04/03/23 Potential to Achieve Goals: Good Progress towards PT goals: Progressing toward goals    Frequency    Min 1X/week       AM-PAC PT "6 Clicks" Mobility   Outcome Measure  Help needed turning from your back to your side while in a flat bed without using bedrails?: A Little Help needed moving from lying on your back to sitting on the side of a flat bed without using bedrails?: A Little Help needed moving to and from a bed to a chair (including a wheelchair)?: A Little Help needed standing up from a chair using your arms (e.g., wheelchair or bedside chair)?: A Little Help needed to walk in hospital room?: A Little Help needed climbing 3-5 steps with a railing? : Total 6 Click Score: 16    End of Session Equipment Utilized During Treatment: Gait belt Activity Tolerance: Patient tolerated treatment well Patient left: with call bell/phone within reach;in chair;with chair alarm set Nurse Communication: Mobility status PT Visit Diagnosis: Unsteadiness on feet (R26.81);History of falling (Z91.81);Pain;Difficulty in walking, not elsewhere classified (R26.2) Pain - Right/Left: Right Pain - part of body: Hip     Time: 1610-9604 PT Time Calculation (min) (ACUTE ONLY): 31 min  Charges:    $Therapeutic Exercise: 8-22 mins $Therapeutic Activity: 8-22 mins PT General Charges $$ ACUTE PT VISIT: 1 Visit                     Danyah Guastella  B. Beverely Risen PT, DPT Acute Rehabilitation Services Please use secure chat or  Call Office [phone]    Elon Alas [facility] 03/22/2023, 9:54 AM

## 2023-03-22 NOTE — Plan of Care (Signed)
°  Problem: Activity: °Goal: Risk for activity intolerance will decrease °Outcome: Progressing °  °Problem: Elimination: °Goal: Will not experience complications related to bowel motility °Outcome: Progressing °  °Problem: Pain Managment: °Goal: General experience of comfort will improve °Outcome: Progressing °  °Problem: Skin Integrity: °Goal: Risk for impaired skin integrity will decrease °Outcome: Progressing °  °

## 2023-03-23 DIAGNOSIS — Z951 Presence of aortocoronary bypass graft: Secondary | ICD-10-CM | POA: Diagnosis not present

## 2023-03-23 DIAGNOSIS — I5022 Chronic systolic (congestive) heart failure: Secondary | ICD-10-CM

## 2023-03-23 DIAGNOSIS — S72011A Unspecified intracapsular fracture of right femur, initial encounter for closed fracture: Secondary | ICD-10-CM | POA: Diagnosis not present

## 2023-03-23 DIAGNOSIS — I5042 Chronic combined systolic (congestive) and diastolic (congestive) heart failure: Secondary | ICD-10-CM | POA: Diagnosis not present

## 2023-03-23 DIAGNOSIS — S72001A Fracture of unspecified part of neck of right femur, initial encounter for closed fracture: Secondary | ICD-10-CM | POA: Diagnosis not present

## 2023-03-23 DIAGNOSIS — E785 Hyperlipidemia, unspecified: Secondary | ICD-10-CM | POA: Diagnosis not present

## 2023-03-23 DIAGNOSIS — Z95810 Presence of automatic (implantable) cardiac defibrillator: Secondary | ICD-10-CM | POA: Diagnosis not present

## 2023-03-23 DIAGNOSIS — Z89512 Acquired absence of left leg below knee: Secondary | ICD-10-CM | POA: Diagnosis not present

## 2023-03-23 DIAGNOSIS — N1831 Chronic kidney disease, stage 3a: Secondary | ICD-10-CM | POA: Diagnosis not present

## 2023-03-23 DIAGNOSIS — Z01810 Encounter for preprocedural cardiovascular examination: Secondary | ICD-10-CM | POA: Diagnosis not present

## 2023-03-23 DIAGNOSIS — E1159 Type 2 diabetes mellitus with other circulatory complications: Secondary | ICD-10-CM | POA: Diagnosis not present

## 2023-03-23 DIAGNOSIS — S88112S Complete traumatic amputation at level between knee and ankle, left lower leg, sequela: Secondary | ICD-10-CM | POA: Diagnosis not present

## 2023-03-23 LAB — BASIC METABOLIC PANEL
Anion gap: 10 (ref 5–15)
BUN: 46 mg/dL — ABNORMAL HIGH (ref 6–20)
CO2: 21 mmol/L — ABNORMAL LOW (ref 22–32)
Calcium: 8.3 mg/dL — ABNORMAL LOW (ref 8.9–10.3)
Chloride: 96 mmol/L — ABNORMAL LOW (ref 98–111)
Creatinine, Ser: 2.2 mg/dL — ABNORMAL HIGH (ref 0.61–1.24)
GFR, Estimated: 36 mL/min — ABNORMAL LOW (ref >60)
Glucose, Bld: 160 mg/dL — ABNORMAL HIGH (ref 70–99)
Potassium: 4.7 mmol/L (ref 3.5–5.1)
Sodium: 127 mmol/L — ABNORMAL LOW (ref 135–145)

## 2023-03-23 MED ORDER — ASPIRIN 81 MG PO TBEC: 81.0000 mg | DELAYED_RELEASE_TABLET | Freq: Two times a day (BID) | ORAL | 0 refills | Status: AC

## 2023-03-23 MED ORDER — OXYCODONE HCL 5 MG PO TABS: 5.0000 mg | ORAL_TABLET | ORAL | 0 refills | Status: DC | PRN

## 2023-03-23 MED ORDER — CARVEDILOL 6.25 MG PO TABS: 6.2500 mg | ORAL_TABLET | Freq: Two times a day (BID) | ORAL | Status: DC

## 2023-03-23 MED ORDER — GABAPENTIN 100 MG PO CAPS: 200.0000 mg | ORAL_CAPSULE | Freq: Every day | ORAL | 0 refills | Status: DC

## 2023-03-23 MED ORDER — AMIODARONE HCL 200 MG PO TABS: 200.0000 mg | ORAL_TABLET | Freq: Every day | ORAL | 0 refills | Status: DC

## 2023-03-23 MED ORDER — ACETAMINOPHEN 325 MG PO TABS: 650.0000 mg | ORAL_TABLET | Freq: Four times a day (QID) | ORAL | 0 refills | Status: AC | PRN

## 2023-03-23 MED ORDER — CARVEDILOL 6.25 MG PO TABS: 6.2500 mg | ORAL_TABLET | Freq: Two times a day (BID) | ORAL | 0 refills | Status: DC

## 2023-03-23 NOTE — Progress Notes (Signed)
Patient ID: Adam James, male   DOB: Feb 21, 1971, 52 y.o.   MRN: [id number] The patient is now 3 days status post a right anterior total hip replacement to treat a displaced right hip femoral neck fracture.  He is working with issues as it relates to his mobility.  His right hip is stable.  I did change the dressing and his incision looks good.  That current dressing can stay on until a follow-up in 2 weeks and can get wet in the shower.  I did put instructions within epic.  He will continue to work with therapy while he is here for mobility purposes.

## 2023-03-23 NOTE — Progress Notes (Signed)
Mobility Specialist: Progress Note   03/23/23 1117  Mobility  Activity Ambulated with assistance in room  Level of Assistance Contact guard assist, steadying assist  Assistive Device Front wheel walker  Distance Ambulated (ft) 40 ft  RUE Weight Bearing WBAT  RLE Weight Bearing WBAT  Activity Response Tolerated well  Mobility Referral Yes  $Mobility charge 1 Mobility  Mobility Specialist Start Time (ACUTE ONLY) 1035  Mobility Specialist Stop Time (ACUTE ONLY) 1056  Mobility Specialist Time Calculation (min) (ACUTE ONLY) 21 min    Pt was agreeable to mobility session - received in bed. Denied ambulation in hallway but agreed to do laps around room. SV for bed mobility and STS, CG for ambulation. Had c/o R hip and L knee pain/soreness and weakness.Took 1x seated break (~30sec). Observed STS after seated break to be more difficult d/t increased soreness and fatigue - took 2 attempts to power himself up. Returned to bed after second lap without fault. Left in bed with all needs met. Call bell in reach.   Maurene Capes Mobility Specialist Please contact via SecureChat or Rehab office at [phone]

## 2023-03-23 NOTE — Progress Notes (Signed)
Pt in stable condition; BSC & tub bench provided.Daughter in room as pt.'s transport.Pt requested pain med & muscle relaxer prior to discharge & given.

## 2023-03-23 NOTE — Progress Notes (Addendum)
Transition of Care [facility]) - Inpatient Brief Assessment   Patient Details  Name: Adam James MRN: [id number] Date of Birth: [birth year]  Transition of Care [facility]) CM/SW Contact:    Janae Bridgeman, RN Phone Number: 03/23/2023, 11:03 AM   Clinical Narrative: Patient is S/P Right anterior direct THA on 03/20/2023.  Patient plans to return home with his daughter once his pain is better.  The patient needs home health services - I called and spoke with Duwaine Maxin, CM with [facility] and she accepted for [facility] PT, OT, aide and MSW. Home health orders placed to be co-signed by attending MD.  Patient states that he would like his daughter to be paid through [facility] personal care services in the home.  HH MSW was added to the home health services for follow up in the community.  DMe - patient has RW and Wc in the home.  Patient will need 3:1 and tub bench.  I called [employer] to have both delivered to the patient's hospital room today.  Patient plans to discharge home with the daughter when medically stable to discharge.   Transition of Care Asessment: Insurance and Status: Insurance coverage has been reviewed Patient has primary care physician: Yes Home environment has been reviewed: Yes - Home with daughter Prior level of function:: Independent with assistance from family Prior/Current Home Services: No current home services Social Determinants of Health Reivew: SDOH reviewed interventions complete Readmission risk has been reviewed: Yes Transition of care needs: transition of care needs identified, TOC will continue to follow

## 2023-03-23 NOTE — Plan of Care (Signed)
  Problem: Health Behavior/Discharge Planning: Goal: Ability to manage health-related needs will improve Outcome: Progressing   Problem: Activity: Goal: Risk for activity intolerance will decrease Outcome: Progressing   Problem: Pain Managment: Goal: General experience of comfort will improve Outcome: Progressing   

## 2023-03-23 NOTE — Plan of Care (Signed)
  Problem: Activity: Goal: Risk for activity intolerance will decrease Outcome: Progressing   Problem: Pain Managment: Goal: General experience of comfort will improve Outcome: Progressing   Problem: Activity: Goal: Ability to tolerate increased activity will improve Outcome: Progressing

## 2023-03-23 NOTE — Progress Notes (Signed)
Occupational Therapy Treatment Patient Details Name: Adam James MRN: [id number] DOB: Oct 22, 1970 Today's Date: 03/23/2023   History of present illness Pt. is 52 yo male who presents on 03/19/23 after falling when L knee gave way and sustaining R femoral neck fx. Underwent R anterior direct THA 03/20/23.  PMH: CHF with EF of 25%, DM II, MI, OM, CABG, CKD, HTN, L BKA, defib placement, fall 03/10/23 with R clavicle fx and injury to L residual limb   OT comments  Patient making good gains with OT treatment. Patient declined transfer to recliner due to difficulty exiting yesterday. Patient to attempt toilet transfer with ambulating to bathroom but declined once on EOB. Patient performed mobility in room x2 with RW with min assist to simulate toilet transfers. Patient returned to supine at end of session with min assist due to assistance needed with RLE. Patient will benefit from continued inpatient follow up therapy, <3 hours/day to increase independence and safety with self care and functional transfers. Acute OT to continue to follow.       If plan is discharge home, recommend the following:  A lot of help with walking and/or transfers;A little help with bathing/dressing/bathroom;Assistance with cooking/housework;Assist for transportation;Help with stairs or ramp for entrance   Equipment Recommendations  Other (comment) (defer)    Recommendations for Other Services      Precautions / Restrictions Precautions Precautions: Fall;Other (comment) Precaution Comments: h/o L BKA, prosthesis in room Restrictions Weight Bearing Restrictions: No RUE Weight Bearing: Weight bearing as tolerated RLE Weight Bearing: Weight bearing as tolerated       Mobility Bed Mobility Overal bed mobility: Needs Assistance Bed Mobility: Supine to Sit, Sit to Supine     Supine to sit: HOB elevated, Used rails, Supervision Sit to supine: Min assist   General bed mobility comments: assisted RLE into bed     Transfers Overall transfer level: Needs assistance Equipment used: Rolling walker (2 wheels) Transfers: Sit to/from Stand Sit to Stand: From elevated surface, Contact guard assist, Min assist           General transfer comment: Patient able to ambulate in room back to EOB with min assist     Balance Overall balance assessment: Needs assistance Sitting-balance support: No upper extremity supported, Feet supported Sitting balance-[name] Scale: Good     Standing balance support: Bilateral upper extremity supported, During functional activity, Reliant on assistive device for balance Standing balance-[name] Scale: Poor Standing balance comment: reliant on RW for support                           ADL either performed or assessed with clinical judgement   ADL Overall ADL's : Needs assistance/impaired     Grooming: Set up;Sitting               Lower Body Dressing: Minimal assistance;Sitting/lateral leans Lower Body Dressing Details (indicate cue type and reason): patient able to donn LLE prosthesis required min assist to complete donning right shoe Toilet Transfer: Minimal assistance;Regular Teacher, adult education Details (indicate cue type and reason): simulated           General ADL Comments: declined getting in recliner due to difficulty exiting chair yesterday    Extremity/Trunk Assessment              Vision       Perception     Praxis      Cognition Arousal: Alert Behavior During Therapy: [facility] for tasks assessed/performed  Overall Cognitive Status: No family/caregiver present to determine baseline cognitive functioning                                 General Comments: provides input on how he would like to attempt tasks        Exercises      Shoulder Instructions       General Comments VSS on RA    Pertinent Vitals/ Pain       Pain Assessment Pain Assessment: 0-10 Pain Score: 6  Pain Location: R hip, R  clavicle Pain Descriptors / Indicators: Discomfort, Grimacing, Burning Pain Intervention(s): Limited activity within patient's tolerance, Monitored during session, Repositioned  Home Living                                          Prior Functioning/Environment              Frequency  Min 1X/week        Progress Toward Goals  OT Goals(current goals can now be found in the care plan section)  Progress towards OT goals: Progressing toward goals  Acute Rehab OT Goals Patient Stated Goal: get stronger OT Goal Formulation: With patient Time For Goal Achievement: 04/04/23 Potential to Achieve Goals: Good ADL Goals Pt Will Perform Lower Body Dressing: with set-up;sit to/from stand Pt Will Transfer to Toilet: with supervision;ambulating Additional ADL Goal #1: Pt will tolerate at least 5 minutes of OOB activity with prosthetic donned in preparation for ADLs  Plan      Co-evaluation                 AM-PAC OT "6 Clicks" Daily Activity     Outcome Measure   Help from another person eating meals?: None Help from another person taking care of personal grooming?: A Little Help from another person toileting, which includes using toliet, bedpan, or urinal?: A Lot Help from another person bathing (including washing, rinsing, drying)?: A Little Help from another person to put on and taking off regular upper body clothing?: A Little Help from another person to put on and taking off regular lower body clothing?: A Little 6 Click Score: 18    End of Session Equipment Utilized During Treatment: Gait belt;Rolling walker (2 wheels)  OT Visit Diagnosis: Unsteadiness on feet (R26.81);Other abnormalities of gait and mobility (R26.89);Muscle weakness (generalized) (M62.81);History of falling (Z91.81);Repeated falls (R29.6);Pain Pain - Right/Left: Right Pain - part of body: Hip   Activity Tolerance Patient tolerated treatment well   Patient Left in bed;with call  bell/phone within reach   Nurse Communication Mobility status        Time: 0865-7846 OT Time Calculation (min): 33 min  Charges: OT General Charges $OT Visit: 1 Visit OT Treatments $Self Care/Home Management : 8-22 mins $Therapeutic Activity: 8-22 mins  Alfonse Flavors, OTA Acute Rehabilitation Services  Office [phone]   Dewain Penning 03/23/2023, 9:35 AM

## 2023-03-23 NOTE — Progress Notes (Addendum)
Patient Name: Adam James Date of Encounter: 03/23/2023 [facility] Cardiologist: Norman Herrlich, MD   Interval Summary  .    52 y.o. male with a hx of CAD s/p PCI 2007 in [place], CABG 04/2021, ICM/chronic HFrEF, frequent PVCs/VT treated with amiodarone, Abbott ICD 01/2023, anemia, CKD 3a, GIB with erosive esophagitis in 2022, dyslipidemia, HTN, DM, necrotizing fasciitis, L BKA 07/2021, former tobacco abuse now admitted with mechanical fall and Rt hip fx.  Also fall the week before with injury to Rt clavicle.   Was cleared for surgery 03/19/23 and underwent Rt ant total hip arthoplasty 03/20/23.    Today no chest pain, no SOB no complaints, tele has been discontinued.    Vital Signs .    Vitals:   03/22/23 0809 03/22/23 1353 03/22/23 2029 03/23/23 0500  BP: (!) 143/86 120/81 131/84 133/84  Pulse: 92 83 84 86  Resp:  18  17  Temp: 98.5 F (36.9 C) 98.5 F (36.9 C) 98.2 F (36.8 C) 98 F (36.7 C)  TempSrc: Oral Oral Oral Oral  SpO2: 95% 96% 95% 98%  Weight:      Height:        Intake/Output Summary (Last 24 hours) at 03/23/2023 0803 Last data filed at 03/23/2023 0500 Gross per 24 hour  Intake 1390.21 ml  Output 650 ml  Net 740.21 ml      03/21/2023    5:50 AM 03/19/2023   12:47 PM 02/24/2023    6:39 AM  Last 3 Weights  Weight (lbs) 248 lb 3.8 oz 218 lb 4.1 oz 220 lb  Weight (kg) 112.6 kg 99 kg 99.791 kg      Telemetry/ECG    none - Personally Reviewed  Physical Exam .   GEN: No acute distress.   Neck: No JVD Cardiac: RRR, soft systolic murmur, no rubs, or gallops.  Respiratory: Clear to auscultation bilaterally. GI: Soft, nontender, non-distended  MS: No edema of Rt leg, and Lt BKA with prosthesis in place.  Assessment & Plan .     Chronic combined heart failure: EF 25 to 30% 11/2022.   -Appears euvolemic today.  No complaints   -Continue carvedilol 3.125 mg twice daily -stopped digoxin 0.125 mg daily with rising Cr -Continue spironolactone 25 mg  daily -Restart Jardiance 10 mg daily once stable renal function -Reports was not taking entresto, can add back as outpatient if stable renal function  VT: continue amio   Hx ICD: Implanted 02/24/2023.   Right hip fracture: Status post surgery 8/24  per ortho    Hyponatremia 130 today    CKD stage IIIa: Creatinine mildly worsened (2.1) yesterday and today 2.31 , will monitor  recheck today  --stopped dig  --only on spironolactone  no diuretics and no home diuretics otherwise   For questions or updates, please contact [facility] Please consult [email or web address] for contact info under     Signed, Nada Boozer, NP    History and all data above reviewed.  Patient examined.  I agree with the findings as above.    The patient denies any chest pain or SOB.   The patient exam reveals COR:RRR  ,  Lungs: Clear  ,  Abd: Positive bowel sounds, no rebound no guarding, Ext    Trace edema right leg  .  All available labs, radiology testing, previous records reviewed. Agree with documented assessment and plan. Acute ischemic CM:  Seems to be euvolemic.  Holding off on ARB/ARNi or Jardiance for now.  I will increase the beta blocker today.    We will arrange follow up.  Please call with further questions.   Fayrene Fearing [facility]  10:48 AM  03/23/2023

## 2023-03-23 NOTE — Progress Notes (Signed)
CSW met with pt and daughter [place] for SDOH: Transportation.  They also had questions regarding potential for [facility] aide through pt medicaid.  [employer] form filled out and signed by MD, provided to pt for [facility] aide evaluation.  CSW to fax referral to [employer].  Medicaid transportation contact information also provided to pt.  Normally, daughter does provide all transportation but they are interested in other options. Daleen Squibb, MSW, LCSW 8/27/20243:27 PM

## 2023-03-23 NOTE — Discharge Summary (Signed)
Physician Discharge Summary  Adam James ZOX:[id number] DOB: September 15, 1970 DOA: 03/19/2023  PCP: Hoy Register, MD  Admit date: 03/19/2023 Discharge date: 03/23/2023  Admitted From: Home Disposition: Home with home health  Recommendations for Outpatient Follow-up:  Follow up with PCP in 1-2 weeks Follow-up with orthopedic surgery as scheduled  Home Health: PT OT social worker indeed Equipment/Devices: 3 and 1, tub bench  Discharge Condition: Stable CODE STATUS: Full Diet recommendation: Low-salt low-fat low-carb diet  Brief/Interim Summary: Patient is a 52 year old Caucasian male with a history of type 2 diabetes, ischemic cardiomyopathy status post AICD in July 2024, history of left below the knee amputation, chronic systolic heart failure EF of 25%, CKD stage IIIa baseline creatinine 1.6-1.8, hyperlipidemia, coronary disease status post CABG presents to the ER today after a fall.  Imaging notable for right femoral neck fracture.  Hospitalist called for admission, orthopedics, Dr. Magnus Ivan, called in consult.   Patient tolerated procedure well, continues to advance ambulation with PT -patient with no fevers chills pain is currently well-controlled, tolerating p.o. well able to ambulate with physical therapy with minimal assist.  Multiple medication changes as below, appreciate cardiology assistance with heart rate and blood pressure medications.  Orthopedics to follow-up with patient postoperatively in the next 1 to 2 weeks per their schedule.  Patient otherwise stable and agreeable for discharge home to continue therapy with home health company.   Discharge Diagnoses:  Principal Problem:   Fracture of femoral neck, right, closed (HCC) Active Problems:   Ischemic cardiomyopathy with implantable cardioverter-defibrillator (ICD)   Type 2 diabetes mellitus (HCC)   Dyslipidemia   Chronic combined systolic (congestive) and diastolic (congestive) heart failure (HCC) - LVEF 25%   S/P  CABG x 3   Below-knee amputation of left lower extremity (HCC)   S/P ICD (internal cardiac defibrillator) procedure - Abbott GALLANT VR Serial [id number]. MRI Compatible   CKD stage 3a, GFR 45-59 ml/min (HCC) - baseline SCr 1.6-1.8   Closed subcapital fracture of femur, right, initial encounter (HCC)   Preop cardiovascular exam   S/P BKA (below knee amputation) unilateral, left (HCC)  Fracture of femoral neck, right, closed (HCC) Ambulatory dysfunction, acute - Orthopedic surgery following, appreciate insight and recommendations - ORIF 8/24 - total hip arthroplasty  - Plan for aspirin 81 twice daily as DVT prophylaxis per orthopedic surgery - Pain control ongoing, postoperative PT OT evaluation pending surgical clearance   CKD stage 3a, GFR 45-59 ml/min (HCC) - baseline SCr 1.6-1.8 Stable -at baseline   S/P ICD (internal cardiac defibrillator) procedure - Abbott GALLANT VR Serial [id number]. MRI Compatible Stable. AICD placed 02-24-2023.   Below-knee amputation of left lower extremity (HCC) Chronic. Pt has left LE prosthesis.   S/P CABG x 3 Stable.   Chronic combined systolic (congestive) and diastolic (congestive) heart failure (HCC) - LVEF 25% Stable. Euvolemic.  Continue core measures per cardiology -carvedilol dose increased, continue Jardiance   Dyslipidemia Continue crestor.   Type 2 diabetes mellitus (HCC) Resume home medications, diabetic diet   Ischemic cardiomyopathy with implantable cardioverter-defibrillator (ICD) Stable. No chest pain.  Discharge Instructions   Allergies as of 03/23/2023       Reactions   Morphine Itching   Itching under skin        Medication List     STOP taking these medications    atorvastatin 80 MG tablet Commonly known as: LIPITOR   cephALEXin 500 MG capsule Commonly known as: Keflex   digoxin 0.125 MG tablet Commonly known as: LANOXIN  eplerenone 25 MG tablet Commonly known as: INSPRA       TAKE these  medications    acetaminophen 325 MG tablet Commonly known as: TYLENOL Take 2 tablets (650 mg total) by mouth every 6 (six) hours as needed for mild pain (or Fever >/= 101).   amiodarone 200 MG tablet Commonly known as: PACERONE Take 1 tablet (200 mg total) by mouth daily. What changed: See the new instructions.   aspirin EC 81 MG tablet Take 1 tablet (81 mg total) by mouth daily.   Blood Pressure Cuff Misc 1 each by Does not apply route as directed.   carvedilol 6.25 MG tablet Commonly known as: COREG Take 1 tablet (6.25 mg total) by mouth 2 (two) times daily with a meal. What changed:  medication strength how much to take when to take this   empagliflozin 10 MG Tabs tablet Commonly known as: Jardiance Take 1 tablet (10 mg total) by mouth daily. Please make appointment with Dr. Alvis Lemmings for more refills.   gabapentin 100 MG capsule Commonly known as: NEURONTIN Take 2 capsules (200 mg total) by mouth at bedtime. What changed:  medication strength See the new instructions.   glipiZIDE 5 MG tablet Commonly known as: GLUCOTROL Take 1 tablet (5 mg total) by mouth daily before breakfast.   oxyCODONE 5 MG immediate release tablet Commonly known as: Oxy IR/ROXICODONE Take 1-2 tablets (5-10 mg total) by mouth every 4 (four) hours as needed for moderate pain (pain score 4-6). What changed:  how much to take reasons to take this   rosuvastatin 20 MG tablet Commonly known as: Crestor Take 1 tablet (20 mg total) by mouth daily.   Semaglutide (1 MG/DOSE) 4 MG/3ML Sopn Inject 1 mg as directed once a week. What changed: Another medication with the same name was removed. Continue taking this medication, and follow the directions you see here.   True Metrix Blood Glucose Test test strip Generic drug: glucose blood Use as instructed twice daily   TRUEplus Lancets 28G Misc Use as directed twice daily at 8 am and 10 pm.   venlafaxine XR 75 MG 24 hr capsule Commonly known as:  EFFEXOR-XR TAKE 1 CAPSULE(75 MG) BY MOUTH DAILY WITH BREAKFAST               Durable Medical Equipment  (From admission, onward)           Start     Ordered   03/23/23 0904  For home use only DME Tub bench  Once        03/23/23 0904   03/20/23 1150  DME 3 n 1  Once        03/20/23 1149   03/20/23 1150  DME Walker rolling  Once       Question Answer Comment  Walker: With 5 Inch Wheels   Patient needs a walker to treat with the following condition Status post total replacement of right hip      03/20/23 1149            Follow-up Information     Kathryne Hitch, MD. Schedule an appointment as soon as possible for a visit in 2 week(s).   Specialty: Orthopedic Surgery Contact information: [address] [place] [address] [phone]         [facility] Follow up.   Why: [facility] will be providing home health services to include PT, OT, aide and MSW.  They will call you  in the next 24-48 hours to set up services. Contact information: [address] [place] [address] [phone]         Laurey Morale, MD Follow up.   Specialty: Cardiology Why: the office should call you with date and time.  if you have not heard my Thursday please call the office Contact information: [address] [place] [address] [phone]         [facility] Follow up.   Why: [employer] will be providing a 3:1 and tub bench to the hospital room before you are discharged home. Contact information: [address] [place] [address] [phone]                Allergies  Allergen Reactions   Morphine Itching    Itching under skin    Consultations: Orthopedic surgery, cardiology  Procedures/Studies: DG HIP UNILAT WITH PELVIS 2-3 VIEWS RIGHT  Result Date: 03/20/2023 CLINICAL DATA:  Elective surgery, right hip arthroplasty. EXAM: DG HIP (WITH OR WITHOUT PELVIS) 2-3V RIGHT  COMPARISON:  Preoperative exam FINDINGS: Eight fluoroscopic spot views of the pelvis and right hip obtained in the operating room. Sequential images during hip arthroplasty. Fluoroscopy time 16.4 seconds. Dose 2.5 mGy. IMPRESSION: Intraoperative fluoroscopy during right hip arthroplasty. Electronically Signed   By: Narda Rutherford M.D.   On: 03/20/2023 12:38   DG Pelvis Portable  Result Date: 03/20/2023 CLINICAL DATA:  Postop right hip replacement. EXAM: PORTABLE PELVIS 1-2 VIEWS COMPARISON:  Preoperative exam FINDINGS: Right hip arthroplasty in expected alignment. No periprosthetic lucency or fracture. Recent postsurgical change includes air and edema in the soft tissues. IMPRESSION: Right hip arthroplasty without immediate postoperative complication. Electronically Signed   By: Narda Rutherford M.D.   On: 03/20/2023 12:37   DG C-Arm 1-60 Min-No Report  Result Date: 03/20/2023 Fluoroscopy was utilized by the requesting physician.  No radiographic interpretation.   DG C-Arm 1-60 Min-No Report  Result Date: 03/20/2023 Fluoroscopy was utilized by the requesting physician.  No radiographic interpretation.   DG Chest Port 1 View  Result Date: 03/19/2023 CLINICAL DATA:  Fall at home, hip fracture. In a separate recent fall the patient fractured his right clavicle, treated by Dr. Lajoyce Corners. EXAM: PORTABLE CHEST 1 VIEW COMPARISON:  02/24/2023 FINDINGS: Interval fracture the right distal clavicle with mild comminution. AICD noted.  Prior CABG.  Heart size within normal limits. Old right lateral fourth and fifth rib deformities from healed fractures. The lungs appear clear.  Costophrenic angles are both excluded. IMPRESSION: 1. Interval acute fracture of the right distal clavicle with mild comminution. 2. Old healed right lateral fourth and fifth rib fractures. 3. Prior CABG and AICD. Electronically Signed   By: Gaylyn Rong M.D.   On: 03/19/2023 17:40   DG Hip Port Unilat W or Wo Pelvis 1 View  Right  Result Date: 03/19/2023 CLINICAL DATA:  fall EXAM: DG HIP (WITH OR WITHOUT PELVIS) 1V PORT RIGHT COMPARISON:  None Available. FINDINGS: Suboptimal exam. Mildly displaced and angulated subcapital fracture of right femoral neck noted. No other acute fracture or dislocation. No aggressive osseous lesion. Normal and symmetric sacroiliac joints. Visualized sacral arcuate lines are unremarkable. Unremarkable symphysis pubis. There are mild degenerative changes of the right hip joint without significant joint space narrowing. Osteophytosis of the superior acetabulum. No radiopaque foreign bodies. IMPRESSION: 1. Mildly displaced and angulated subcapital fracture of the right femoral neck. Electronically Signed   By: Jules Schick M.D.   On: 03/19/2023 14:52  CUP PACEART INCLINIC DEVICE CHECK  Result Date: 03/10/2023 Wound check appointment. Dermabond removed. Wound appears redness around edges. No drainage, swelling or warmth noted to site. Dr. Ladona Ridgel in to review, verbal orders obtained for Keflex 500mg  BID X5 days. Script sent to pharmacy. Wound recheck scheduled in 7 days.  Incision edges approximated. Normal device function. Thresholds, sensing, and impedances consistent with implant measurements. Device programmed at 3.5V for extra safety margin until 3 month visit. Histogram distribution appropriate for patient and level of activity. No mode switches or ventricular arrhythmias noted. Patient educated about wound care, arm mobility, lifting restrictions, shock plan. ROV in 3 months with implanting physician.Sharia Reeve, BSN, RN  DG Chest 2 View  Result Date: 02/24/2023 CLINICAL DATA:  Cardiac device EXAM: CHEST - 2 VIEW COMPARISON:  Chest x-ray dated 2024 FINDINGS: Cardiac and mediastinal contours are unchanged post median sternotomy and CABG. Interval placement of left chest wall single lead ICD with tip overlying the expected area of the right ventricle. Lateral view is somewhat limited due to  patient arm position. Lungs are clear. No evidence of pleural effusion or pneumothorax. IMPRESSION: No evidence of pleural effusion or pneumothorax. Electronically Signed   By: Allegra Lai M.D.   On: 02/24/2023 14:56     Subjective: No acute issues or events overnight denies nausea vomit diarrhea constipation headache fevers chills or chest pain.  Pain with ambulation ongoing but markedly improved since previous ambulatory attempt with PT.   Discharge Exam: Vitals:   03/23/23 0500 03/23/23 0814  BP: 133/84 (!) 142/82  Pulse: 86 89  Resp: 17 16  Temp: 98 F (36.7 C) 98.2 F (36.8 C)  SpO2: 98% 100%   Vitals:   03/22/23 1353 03/22/23 2029 03/23/23 0500 03/23/23 0814  BP: 120/81 131/84 133/84 (!) 142/82  Pulse: 83 84 86 89  Resp: 18  17 16   Temp: 98.5 F (36.9 C) 98.2 F (36.8 C) 98 F (36.7 C) 98.2 F (36.8 C)  TempSrc: Oral Oral Oral Oral  SpO2: 96% 95% 98% 100%  Weight:      Height:        General: Pt is alert, awake, not in acute distress Cardiovascular: RRR, S1/S2 +, no rubs, no gallops Respiratory: CTA bilaterally, no wheezing, no rhonchi Abdominal: Soft, NT, ND, bowel sounds + Extremities: no edema, no cyanosis    The results of significant diagnostics from this hospitalization (including imaging, microbiology, ancillary and laboratory) are listed below for reference.     Microbiology: Recent Results (from the past 240 hour(s))  MRSA Next Gen by PCR, Nasal     Status: None   Collection Time: 03/19/23  5:32 PM   Specimen: Nasal Mucosa; Nasal Swab  Result Value Ref Range Status   MRSA by PCR Next Gen NOT DETECTED NOT DETECTED Final    Comment: (NOTE) The GeneXpert MRSA Assay (FDA approved for NASAL specimens only), is one component of a comprehensive MRSA colonization surveillance program. It is not intended to diagnose MRSA infection nor to guide or monitor treatment for MRSA infections. Test performance is not FDA approved in patients less than 76  years old. Performed at [facility], [address], [place], [place] [address]      Labs: BNP (last 3 results) Recent Labs    04/28/22 1113 08/19/22 0913 09/02/22 0913  BNP 76.3 102.2* 105.2*   Basic Metabolic Panel: Recent Labs  Lab 03/20/23 0345 03/20/23 1324 03/21/23 0443 03/22/23 0444 03/23/23 0852  NA 132* 135 132*  130* 127*  K 4.2 4.6 4.7 4.5 4.7  CL 99 99 99 97* 96*  CO2 22 21* 23 22 21*  GLUCOSE 127* 155* 205* 156* 160*  BUN 27* 29* 33* 45* 46*  CREATININE 1.61* 1.82* 2.01* 2.31* 2.20*  CALCIUM 8.6* 8.9 8.0* 8.3* 8.3*  MG 2.3  --   --   --   --    Liver Function Tests: Recent Labs  Lab 03/19/23 1516  AST 25  ALT 25  ALKPHOS 111  BILITOT 1.1  PROT 6.8  ALBUMIN 3.4*   No results for input(s): "LIPASE", "AMYLASE" in the last 168 hours. No results for input(s): "AMMONIA" in the last 168 hours. CBC: Recent Labs  Lab 03/19/23 1516 03/20/23 0345 03/20/23 1324 03/22/23 0444  WBC 13.1* 10.6* 15.2* 11.8*  NEUTROABS 10.0*  --   --   --   HGB 12.9* 12.9* 12.6* 10.9*  HCT 39.5 39.5 38.5* 33.1*  MCV 90.6 91.9 92.5 89.7  PLT 275 254 240 260   Cardiac Enzymes: No results for input(s): "CKTOTAL", "CKMB", "CKMBINDEX", "TROPONINI" in the last 168 hours. BNP: Invalid input(s): "POCBNP" CBG: Recent Labs  Lab 03/19/23 1639 03/20/23 0831 03/20/23 1110 03/20/23 1647 03/22/23 1002  GLUCAP 135* 122* 122* 221* 178*   D-Dimer No results for input(s): "DDIMER" in the last 72 hours. Hgb A1c No results for input(s): "HGBA1C" in the last 72 hours. Lipid Profile No results for input(s): "CHOL", "HDL", "LDLCALC", "TRIG", "CHOLHDL", "LDLDIRECT" in the last 72 hours. Thyroid function studies No results for input(s): "TSH", "T4TOTAL", "T3FREE", "THYROIDAB" in the last 72 hours.  Invalid input(s): "FREET3" Anemia work up No results for input(s): "VITAMINB12", "FOLATE", "FERRITIN", "TIBC", "IRON", "RETICCTPCT" in the last 72 hours. Urinalysis     Component Value Date/Time   COLORURINE YELLOW 05/20/2021 1004   APPEARANCEUR CLEAR 05/20/2021 1004   LABSPEC 1.012 05/20/2021 1004   PHURINE 5.0 05/20/2021 1004   GLUCOSEU 50 (A) 05/20/2021 1004   HGBUR LARGE (A) 05/20/2021 1004   BILIRUBINUR NEGATIVE 05/20/2021 1004   KETONESUR NEGATIVE 05/20/2021 1004   PROTEINUR >=300 (A) 05/20/2021 1004   NITRITE NEGATIVE 05/20/2021 1004   LEUKOCYTESUR NEGATIVE 05/20/2021 1004   Sepsis Labs Recent Labs  Lab 03/19/23 1516 03/20/23 0345 03/20/23 1324 03/22/23 0444  WBC 13.1* 10.6* 15.2* 11.8*   Microbiology Recent Results (from the past 240 hour(s))  MRSA Next Gen by PCR, Nasal     Status: None   Collection Time: 03/19/23  5:32 PM   Specimen: Nasal Mucosa; Nasal Swab  Result Value Ref Range Status   MRSA by PCR Next Gen NOT DETECTED NOT DETECTED Final    Comment: (NOTE) The GeneXpert MRSA Assay (FDA approved for NASAL specimens only), is one component of a comprehensive MRSA colonization surveillance program. It is not intended to diagnose MRSA infection nor to guide or monitor treatment for MRSA infections. Test performance is not FDA approved in patients less than 64 years old. Performed at [facility], [address] [place], [place] [address]      Time coordinating discharge: Over 30 minutes  SIGNED:   Azucena Fallen, DO [facility] 03/23/2023, 11:22 AM Pager   If 7PM-7AM, please contact night-coverage [email or web address]

## 2023-03-24 ENCOUNTER — Other Ambulatory Visit (HOSPITAL_COMMUNITY): Payer: Self-pay | Admitting: Family Medicine

## 2023-03-24 ENCOUNTER — Telehealth: Payer: Self-pay

## 2023-03-24 DIAGNOSIS — Z951 Presence of aortocoronary bypass graft: Secondary | ICD-10-CM | POA: Diagnosis not present

## 2023-03-24 DIAGNOSIS — E871 Hypo-osmolality and hyponatremia: Secondary | ICD-10-CM | POA: Diagnosis not present

## 2023-03-24 DIAGNOSIS — S72011D Unspecified intracapsular fracture of right femur, subsequent encounter for closed fracture with routine healing: Secondary | ICD-10-CM | POA: Diagnosis not present

## 2023-03-24 DIAGNOSIS — S42001D Fracture of unspecified part of right clavicle, subsequent encounter for fracture with routine healing: Secondary | ICD-10-CM | POA: Diagnosis not present

## 2023-03-24 DIAGNOSIS — E669 Obesity, unspecified: Secondary | ICD-10-CM | POA: Diagnosis not present

## 2023-03-24 DIAGNOSIS — E1122 Type 2 diabetes mellitus with diabetic chronic kidney disease: Secondary | ICD-10-CM | POA: Diagnosis not present

## 2023-03-24 DIAGNOSIS — I130 Hypertensive heart and chronic kidney disease with heart failure and stage 1 through stage 4 chronic kidney disease, or unspecified chronic kidney disease: Secondary | ICD-10-CM | POA: Diagnosis not present

## 2023-03-24 DIAGNOSIS — Z89512 Acquired absence of left leg below knee: Secondary | ICD-10-CM | POA: Diagnosis not present

## 2023-03-24 DIAGNOSIS — I5042 Chronic combined systolic (congestive) and diastolic (congestive) heart failure: Secondary | ICD-10-CM | POA: Diagnosis not present

## 2023-03-24 DIAGNOSIS — D631 Anemia in chronic kidney disease: Secondary | ICD-10-CM | POA: Diagnosis not present

## 2023-03-24 DIAGNOSIS — E785 Hyperlipidemia, unspecified: Secondary | ICD-10-CM | POA: Diagnosis not present

## 2023-03-24 DIAGNOSIS — N1831 Chronic kidney disease, stage 3a: Secondary | ICD-10-CM | POA: Diagnosis not present

## 2023-03-24 DIAGNOSIS — I255 Ischemic cardiomyopathy: Secondary | ICD-10-CM | POA: Diagnosis not present

## 2023-03-24 NOTE — Transitions of Care (Post Inpatient/ED Visit) (Signed)
   03/24/2023  Name: Adam James MRN: [id number] DOB: 09-13-1970  Today's TOC FU Call Status: Unsuccessful Call (1st Attempt) Date: 03/24/23  Attempted to reach the patient regarding the most recent Inpatient/ED visit.  Follow Up Plan: Additional outreach attempts will be made to reach the patient to complete the Transitions of Care (Post Inpatient/ED visit) call.   Calen Posch J. Cristela Felt, RN, BSN, MSN Care Management Coordinator/[place] Phone Number:  [phone]

## 2023-03-25 ENCOUNTER — Telehealth: Payer: Self-pay

## 2023-03-25 NOTE — Transitions of Care (Post Inpatient/ED Visit) (Signed)
03/25/2023  Name: Adam James MRN: [id number] DOB: July 04, 1971  Today's TOC FU Call Status: Today's TOC FU Call Status:: Successful TOC FU Call Completed Patient's Name and Date of Birth confirmed.  Discussed Transition of Care 30 day program, patient in agreement to participate in program.   Transition Care Management Follow-up Telephone Call Date of Discharge: 03/23/23 Discharge Facility: [facility]) Type of Discharge: Inpatient Admission Primary Inpatient Discharge Diagnosis:: "Fracture of femoral neck, right, closed" How have you been since you were released from the hospital?: Better Any questions or concerns?: No (Denies questions or concerns today, states "I am doing fine".)  Items Reviewed: Did you receive and understand the discharge instructions provided?: Yes (Review of discharge instructions with patient, verbalized understanding,) Medications obtained,verified, and reconciled?: Yes (Medications Reviewed) (Verbalized understanding of medications.) Do you have support at home?: Yes Name of Support/Comfort Primary Source: Daughter, [place]  Medications Reviewed Today: Medications Reviewed Today     Reviewed by Amada Kingfisher, RN (Registered Nurse) on 03/25/23 at [phone]  Med List Status: <None>   Medication Order Taking? Sig Documenting Provider Last Dose Status Informant  acetaminophen (TYLENOL) 325 MG tablet [id number] Yes Take 2 tablets (650 mg total) by mouth every 6 (six) hours as needed for mild pain (or Fever >/= 101). Azucena Fallen, MD Taking Active   amiodarone (PACERONE) 200 MG tablet [id number] Yes Take 1 tablet (200 mg total) by mouth daily. Azucena Fallen, MD Taking Active   aspirin EC 81 MG tablet [id number] Yes Take 1 tablet (81 mg total) by mouth 2 (two) times daily for 18 days. Swallow whole.  Once completed transition back to once daily dosing as discussed. Azucena Fallen, MD Taking Active   Blood Pressure Monitoring (BLOOD PRESSURE  CUFF) MISC [id number] Yes 1 each by Does not apply route as directed. Anders Simmonds, PA-C Taking Active Self, Child, Pharmacy Records  carvedilol (COREG) 6.25 MG tablet [id number] Yes Take 1 tablet (6.25 mg total) by mouth 2 (two) times daily with a meal. Azucena Fallen, MD Taking Active   empagliflozin (JARDIANCE) 10 MG TABS tablet [id number] Yes Take 1 tablet (10 mg total) by mouth daily. Please make appointment with Dr. Alvis Lemmings for more refills. Hoy Register, MD Taking Active Self, Child, Pharmacy Records           Med Note Berneda Rose, [facility]   Fri Mar 19, 2023  1:55 PM) Medication filled back on 01/14/23 for 30 ds. Pt states he took it today(03/19/23).  gabapentin (NEURONTIN) 100 MG capsule [id number] Yes Take 2 capsules (200 mg total) by mouth at bedtime. Azucena Fallen, MD Taking Active   glipiZIDE (GLUCOTROL) 5 MG tablet [id number] Yes Take 1 tablet (5 mg total) by mouth daily before breakfast. Anders Simmonds, PA-C Taking Active Self, Child, Pharmacy Records  glucose blood (TRUE METRIX BLOOD GLUCOSE TEST) test strip [id number] Yes Use as instructed twice daily Mayers, Cari S, PA-C Taking Active Self, Child, Pharmacy Records  oxyCODONE (OXY IR/ROXICODONE) 5 MG immediate release tablet [id number] Yes Take 1-2 tablets (5-10 mg total) by mouth every 4 (four) hours as needed for moderate pain (pain score 4-6). Azucena Fallen, MD Taking Active   rosuvastatin (CRESTOR) 20 MG tablet [id number] Yes Take 1 tablet (20 mg total) by mouth daily. Hoy Register, MD Taking Active Self, Child, Pharmacy Records           Med Note Berneda Rose, [facility]   Fri Mar 19, 2023  1:03 PM)  Semaglutide, 1 MG/DOSE, 4 MG/3ML SOPN [id number] Yes Inject 1 mg as directed once a week. Hoy Register, MD Taking Active Self, Child, Pharmacy Records           Med Note Berneda Rose, [facility]   Fri Mar 19, 2023  1:44 PM) Provider increased his dose from 0.25 to 1. Pt on 0.25 for 4 weeks then increased to 1 mg   TRUEplus Lancets 28G MISC [id number]  Use as directed twice daily at 8 am and 10 pm. Mayers, Cari S, PA-C  Active Self, Child, Pharmacy Records  venlafaxine XR (EFFEXOR-XR) 75 MG 24 hr capsule [id number] Yes TAKE 1 CAPSULE(75 MG) BY MOUTH DAILY WITH BREAKFAST Hoy Register, MD Taking Active Self, Child, Pharmacy Records            Home Care and Equipment/Supplies: Were Home Health Services Ordered?: Yes ([facility] intake completed 8/28, has visit on 8/29 - services for PT/OT, Aid, SW) Name of Home Health Agency:: [facility] Has Agency set up a time to come to your home?: Yes First Home Health Visit Date: 03/24/23 Any new equipment or medical supplies ordered?: Yes Name of Medical supply agency?: 3 and 1 commode, tub bench Were you able to get the equipment/medical supplies?: Yes Do you have any questions related to the use of the equipment/supplies?: No  Functional Questionnaire: Do you need assistance with bathing/showering or dressing?: No Do you need assistance with meal preparation?: No Do you need assistance with eating?: No Do you have difficulty maintaining continence: No Do you need assistance with getting out of bed/getting out of a chair/moving?:  (States he does just fine) Do you have difficulty managing or taking your medications?: No  Follow up appointments reviewed: PCP Follow-up appointment confirmed?: No Specialist Hospital Follow-up appointment confirmed?: No Reason Specialist Follow-Up Not Confirmed: Patient has Specialist Provider Number and will Call for Appointment (Patient to schedule follow-up appointment with Orthopedic Surgery and Cariology.) Do you need transportation to your follow-up appointment?: No (Declined need of assistance, Daughter will provide) Do you understand care options if your condition(s) worsen?: Yes-patient verbalized understanding  SDOH Interventions Today    Flowsheet Row Most Recent Value  SDOH Interventions   Food  Insecurity Interventions Intervention Not Indicated  Housing Interventions Intervention Not Indicated  Transportation Interventions Intervention Not Indicated  Utilities Interventions Intervention Not Indicated  Health Literacy Interventions Intervention Not Indicated       Goals Addressed             This Visit's Progress    Transition of Care       Current Barriers:  Falls  RNCM Clinical Goal(s):  Patient will  remain free of falls  through collaboration with RN Care manager, provider, and care team.   Interventions: Evaluation of current treatment plan related to  self management and patient's adherence to plan as established by provider.   Falls Interventions:  (Status:  New goal.) Short Term Goal Assess for environmental risk factors and home safety modifications Assessed SDOH needs Collaborate with physical therapy Discuss use of assistive devicess and home safety modifications Collaborate with physical therapy Discuss use of assistive devices   Patient Goals/Self-Care Activities: Take medications as prescribed   Attend all scheduled provider appointments Perform all self care activities independently  Call provider office for new concerns or questions  Work with the social worker to address care coordination needs and will continue to work with the clinical team to address health care and disease management related needs  -  MSW through [facility] ordered upon discharge.  Schedule follow-up appointments with Orthopedic Surgery and Cardiology per discharge instructions.  Participate in Physical Therapy and Occupational Therapy as ordered.          Reassess SDOH during next visit on 04/01/2023.   Veasna Santibanez J. Cristela Felt, RN, BSN, MSN Care Management Coordinator/[place] Phone Number:  [phone]

## 2023-03-25 NOTE — Patient Instructions (Signed)
Visit Information  Thank you for taking time to visit with me today. Please don't hesitate to contact me if I can be of assistance to you before our next scheduled telephone appointment.  Our next appointment is by telephone on Thursday 9/24 at 9 am.   Following is a copy of your care plan:   Goals Addressed             This Visit's Progress    Transition of Care       Current Barriers:  Falls  RNCM Clinical Goal(s):  Patient will  remain free of falls  through collaboration with RN Care manager, provider, and care team.   Interventions: Evaluation of current treatment plan related to  self management and patient's adherence to plan as established by provider.   Falls Interventions:  (Status:  New goal.) Short Term Goal Assess for environmental risk factors and home safety modifications Assessed SDOH needs Collaborate with physical therapy Discuss use of assistive devicess and home safety modifications Collaborate with physical therapy Discuss use of assistive devices   Patient Goals/Self-Care Activities: Take medications as prescribed   Attend all scheduled provider appointments Perform all self care activities independently  Call provider office for new concerns or questions  Work with the social worker to address care coordination needs and will continue to work with the clinical team to address health care and disease management related needs  -  MSW through [facility] ordered upon discharge.  Schedule follow-up appointments with Orthopedic Surgery and Cardiology per discharge instructions.  Participate in Physical Therapy and Occupational Therapy as ordered.           The patient verbalized understanding of instructions, educational materials, and care plan provided today and DECLINED offer to receive copy of patient instructions, educational materials, and care plan.  Patient is agreeable to future RN Case Management Transition of Care follow-up visit.     Telephone follow up appointment with care management team member scheduled for: Thursday, 04/01/23 9 am.   Please call the care guide team at [phone] if you need to cancel or reschedule your appointment.   Please call the Suicide and Crisis Lifeline: 988 if you are experiencing a Mental Health or Behavioral Health Crisis or need someone to talk to.  Thank you,   Yogi Arther J. Cristela Felt, RN, BSN, MSN Care Management Coordinator/ Phone Number:  [phone]

## 2023-03-26 ENCOUNTER — Encounter: Payer: Self-pay | Admitting: Orthopedic Surgery

## 2023-03-26 NOTE — Progress Notes (Signed)
Office Visit Note   Patient: Adam James           Date of Birth: 08-26-1970           MRN: [id number] Visit Date: 03/16/2023              Requested by: Hoy Register, MD [address] [place],  [place] [address] PCP: Hoy Register, MD  Chief Complaint  Patient presents with   Right Shoulder - Pain    Right collar bone ER 03/10/2023  s/p fall       HPI: Patient is a 52 year old gentleman that is seen for initial evaluation for closed right clavicle fracture.  Patient went to the emergency room on August 14 he is currently in a sling.  Assessment & Plan: Visit Diagnoses:  1. Closed displaced fracture of acromial end of right clavicle, initial encounter     Plan: Patient's clavicle is displaced one half bone diameter.  He will continue the sling start with range of motion exercises.  At follow-up repeat two-view radiographs of the right clavicle  Follow-Up Instructions: Return in about 3 weeks (around 04/06/2023).   Ortho Exam  Patient is alert, oriented, no adenopathy, well-dressed, normal affect, normal respiratory effort. Examination the right upper extremity is neurovascular intact.  The clavicle fracture is displaced one half bone diameter.  There is no tenting of the skin no ecchymosis or bruising.  No skin abrasions.  Imaging: No results found. No images are attached to the encounter.  Labs: Lab Results  Component Value Date   HGBA1C 9.5 (A) 12/02/2022   HGBA1C 7.5 (A) 08/31/2022   HGBA1C 8.2 (A) 03/31/2022   ESRSEDRATE 25 (H) 03/14/2021   ESRSEDRATE >140 (H) 02/25/2021   CRP 1.6 (H) 03/14/2021   REPTSTATUS 05/06/2021 FINAL 04/30/2021   GRAMSTAIN NO WBC SEEN NO ORGANISMS SEEN  04/30/2021   CULT  04/30/2021    RARE PROVIDENCIA STUARTII RARE ALCALIGENES FAECALIS RARE NORMAL SKIN FLORA CRITICAL RESULT CALLED TO, READ BACK BY AND VERIFIED WITH: RN MARY S. [id number] FCP NO ANAEROBES ISOLATED Performed at [facility], [address], [place], [place] [address]    LABORGA PROVIDENCIA STUARTII 04/30/2021   LABORGA ALCALIGENES FAECALIS 04/30/2021     Lab Results  Component Value Date   ALBUMIN 3.4 (L) 03/19/2023   ALBUMIN 4.0 (L) 12/02/2022   ALBUMIN 3.8 09/02/2022    Lab Results  Component Value Date   MG 2.3 03/20/2023   MG 2.0 12/02/2022   MG 2.1 12/08/2021   No results found for: "VD25OH"  No results found for: "PREALBUMIN"    Latest Ref Rng & Units 03/22/2023    4:44 AM 03/20/2023    1:24 PM 03/20/2023    3:45 AM  CBC EXTENDED  WBC 4.0 - 10.5 K/uL 11.8  15.2  10.6   RBC 4.22 - 5.81 MIL/uL 3.69  4.16  4.30   Hemoglobin 13.0 - 17.0 g/dL 69.6  29.5  28.4   HCT 39.0 - 52.0 % 33.1  38.5  39.5   Platelets 150 - 400 K/uL 260  240  254      There is no height or weight on file to calculate BMI.  Orders:  No orders of the defined types were placed in this encounter.  Meds ordered this encounter  Medications   DISCONTD: oxyCODONE (OXY IR/ROXICODONE) 5 MG immediate release tablet    Sig: Take 1 tablet (5 mg total) by mouth every 4 (four)  hours as needed for severe pain.    Dispense:  30 tablet    Refill:  0     Procedures: No procedures performed  Clinical Data: No additional findings.  ROS:  All other systems negative, except as noted in the HPI. Review of Systems  Objective: Vital Signs: There were no vitals taken for this visit.  Specialty Comments:  No specialty comments available.  PMFS History: Patient Active Problem List   Diagnosis Date Noted   Fracture of femoral neck, right, closed (HCC) 03/19/2023   S/P ICD (internal cardiac defibrillator) procedure - [employer] GALLANT VR Serial #[id number]. 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   Venous stasis ulcer of right calf limited to breakdown of skin without varicose veins (HCC)    Chronic combined systolic (congestive) and diastolic (congestive) heart failure (HCC) - LVEF 25%    Gastroesophageal reflux disease with esophagitis and hemorrhage    Diabetic polyneuropathy associated with type 2 diabetes mellitus (HCC)    Severe protein-calorie malnutrition (HCC)    Type 2 diabetes mellitus (HCC)    Dyslipidemia    CAD (coronary artery disease)    Ischemic cardiomyopathy with implantable cardioverter-defibrillator (ICD)    Coronary artery disease involving native coronary artery of native heart with angina pectoris [facility])    Past Medical History:  Diagnosis Date   Acute on chronic systolic CHF (congestive heart failure) (HCC) 11/04/2021   AICD (automatic cardioverter/defibrillator) present    Anemia    GI bleed; 03/13/11 EGD: relux esophagitis with ulceration/clot   CAD (coronary artery disease)    Cardiomyopathy (HCC)    Chronic HFrEF (heart failure with reduced ejection fraction) (HCC)    CKD stage 3a, GFR 45-59 ml/min (HCC)    Dehiscence of amputation stump of left lower extremity (HCC) 05/06/2022   Depression    Diabetic foot ulcer (HCC)    Dyslipidemia    Dyspnea    08/05/21- not recently.   Hypertension    Myocardial infarction (HCC)    Necrotizing fasciitis (HCC)    NSTEMI (non-ST elevated myocardial infarction) (HCC)    PVC's (premature ventricular contractions)    Type 2 diabetes mellitus (HCC)    V-tach (HCC)     Family History  Problem Relation Age of Onset   Hypertension Mother    Heart failure Father    Hypertension Brother    Congestive Heart Failure Brother     Past Surgical History:  Procedure Laterality Date   AMPUTATION Left 04/30/2021    Procedure: LEFT FOOT 5TH RAY AMPUTATION APPLICATION OF A-CELL POWDER;  Surgeon: [name], MD;  Location: [facility] OR;  Service: Orthopedics;  Laterality: Left;   AMPUTATION Left 07/30/2021   Procedure: LEFT BELOW KNEE AMPUTATION;  Surgeon: [name], MD;  Location: [facility] OR;  Service: Orthopedics;  Laterality: Left;   APPENDECTOMY     APPLICATION OF WOUND VAC Left 03/03/2021   Procedure: APPLICATION OF WOUND VAC;  Surgeon: Tarry Kos, MD;  Location: [facility] OR;  Service: Orthopedics;  Laterality: Left;   APPLICATION OF WOUND VAC  03/07/2021  Procedure: APPLICATION OF WOUND VAC;  Surgeon: [name], MD;  Location[facility] OR;  Service: Orthopedics;;   APPLICATION OF WOUND VAC  07/30/2021   Procedure: APPLICATION OF WOUND VAC;  Surgeon: [name], MD;  Location: [facility] OR;  Service: Orthopedics;;   CARDIAC CATHETERIZATION     COLON SURGERY     CORONARY ARTERY BYPASS GRAFT N/A 05/22/2021   Procedure: CORONARY ARTERY BYPASS GRAFTING (CABG) X 3 USING LEFT INTERNAL MAMMARY ARTERY AND RIGHT ENDOSCOPIC GREATER SAPHENOUS VEIN CONDUITS;  Surgeon: Corliss Skains, MD;  Location: [facility] OR;  Service: Open Heart Surgery;  Laterality: N/A;   ENDOVEIN HARVEST OF GREATER SAPHENOUS VEIN Right 05/22/2021   Procedure: ENDOVEIN HARVEST OF GREATER SAPHENOUS VEIN;  Surgeon: Corliss Skains, MD;  Location: [facility] OR;  Service: Open Heart Surgery;  Laterality: Right;   ESOPHAGOGASTRODUODENOSCOPY (EGD) WITH PROPOFOL N/A 03/12/2021   Procedure: ESOPHAGOGASTRODUODENOSCOPY (EGD) WITH PROPOFOL;  Surgeon: Jenel Lucks, MD;  Location: [facility] ENDOSCOPY;  Service: Gastroenterology;  Laterality: N/A;   I & D EXTREMITY Left 02/24/2021   Procedure: IRRIGATION AND DEBRIDEMENT EXTREMITY;  Surgeon: Myrene Galas, MD;  Location: [facility] OR;  Service: Orthopedics;  Laterality: Left;   I & D EXTREMITY Left 02/26/2021   Procedure: IRRIGATION AND DEBRIDEMENT OF LEG;  Surgeon: [name], MD;  Location: [facility] OR;  Service: Orthopedics;   Laterality: Left;   I & D EXTREMITY Left 03/03/2021   Procedure: LEFT LEG IRRIGATION AND DEBRIDEMENT;  Surgeon: Tarry Kos, MD;  Location: [facility] OR;  Service: Orthopedics;  Laterality: Left;   I & D EXTREMITY Left 03/07/2021   Procedure: REPEAT DEBRIDEMENT LEFT LEG;  Surgeon: [name], MD;  Location: [facility] OR;  Service: Orthopedics;  Laterality: Left;   ICD IMPLANT N/A 02/24/2023   Procedure: ICD IMPLANT;  Surgeon: Duke Salvia, MD;  Location: [facility] INVASIVE CV LAB;  Service: Cardiovascular;  Laterality: N/A;   IR FLUORO GUIDE CV LINE RIGHT  03/10/2021   IR REMOVAL TUN CV CATH W/O FL  03/24/2021   IR US GUIDE VASC ACCESS RIGHT  03/10/2021   LEFT HEART CATH AND CORONARY ANGIOGRAPHY N/A 09/13/2020   Procedure: LEFT HEART CATH AND CORONARY ANGIOGRAPHY;  Surgeon: Lennette Bihari, MD;  Location: [facility] INVASIVE CV LAB;  Service: Cardiovascular;  Laterality: N/A;   LEFT HEART CATH AND CORONARY ANGIOGRAPHY N/A 03/28/2021   Procedure: LEFT HEART CATH AND CORONARY ANGIOGRAPHY;  Surgeon: Laurey Morale, MD;  Location: [facility] INVASIVE CV LAB;  Service: Cardiovascular;  Laterality: N/A;   RIGHT HEART CATH N/A 03/26/2021   Procedure: RIGHT HEART CATH;  Surgeon: Laurey Morale, MD;  Location: [facility] INVASIVE CV LAB;  Service: Cardiovascular;  Laterality: N/A;   STUMP REVISION Left 05/06/2022   Procedure: REVISION LEFT BELOW KNEE AMPUTATION;  Surgeon: [name], MD;  Location: [facility] OR;  Service: Orthopedics;  Laterality: Left;   TEE WITHOUT CARDIOVERSION N/A 05/22/2021   Procedure: TRANSESOPHAGEAL ECHOCARDIOGRAM (TEE);  Surgeon: Corliss Skains, MD;  Location: [facility] OR;  Service: Open Heart Surgery;  Laterality: N/A;   TOE AMPUTATION  2020   TOTAL HIP ARTHROPLASTY Right 03/20/2023   Procedure: TOTAL HIP ARTHROPLASTY ANTERIOR APPROACH;  Surgeon: Kathryne Hitch, MD;  Location: [facility] OR;  Service: Orthopedics;  Laterality: Right;   Social History   Occupational History   Occupation: none    Comment: former  [employer]"  Tobacco Use   Smoking status: Former    Current packs/day: 0.00    Average  packs/day: 3.0 packs/day for 36.0 years (108.0 ttl pk-yrs)    Types: Cigarettes    Start date: 08/27/1984    Quit date: 08/27/2020    Years since quitting: 2.5   Smokeless tobacco: Never  Vaping Use   Vaping status: Never Used  Substance and Sexual Activity   Alcohol use: Not Currently    Comment: Very rare   Drug use: Not Currently    Types: Marijuana    Comment: smoked marijuana in his youth(teenager)   Sexual activity: Not on file

## 2023-03-31 DIAGNOSIS — E1122 Type 2 diabetes mellitus with diabetic chronic kidney disease: Secondary | ICD-10-CM | POA: Diagnosis not present

## 2023-03-31 DIAGNOSIS — E669 Obesity, unspecified: Secondary | ICD-10-CM | POA: Diagnosis not present

## 2023-03-31 DIAGNOSIS — I255 Ischemic cardiomyopathy: Secondary | ICD-10-CM | POA: Diagnosis not present

## 2023-03-31 DIAGNOSIS — D631 Anemia in chronic kidney disease: Secondary | ICD-10-CM | POA: Diagnosis not present

## 2023-03-31 DIAGNOSIS — S42001D Fracture of unspecified part of right clavicle, subsequent encounter for fracture with routine healing: Secondary | ICD-10-CM | POA: Diagnosis not present

## 2023-03-31 DIAGNOSIS — Z89512 Acquired absence of left leg below knee: Secondary | ICD-10-CM | POA: Diagnosis not present

## 2023-03-31 DIAGNOSIS — I130 Hypertensive heart and chronic kidney disease with heart failure and stage 1 through stage 4 chronic kidney disease, or unspecified chronic kidney disease: Secondary | ICD-10-CM | POA: Diagnosis not present

## 2023-03-31 DIAGNOSIS — E785 Hyperlipidemia, unspecified: Secondary | ICD-10-CM | POA: Diagnosis not present

## 2023-03-31 DIAGNOSIS — I5042 Chronic combined systolic (congestive) and diastolic (congestive) heart failure: Secondary | ICD-10-CM | POA: Diagnosis not present

## 2023-03-31 DIAGNOSIS — S72011D Unspecified intracapsular fracture of right femur, subsequent encounter for closed fracture with routine healing: Secondary | ICD-10-CM | POA: Diagnosis not present

## 2023-03-31 DIAGNOSIS — Z951 Presence of aortocoronary bypass graft: Secondary | ICD-10-CM | POA: Diagnosis not present

## 2023-03-31 DIAGNOSIS — N1831 Chronic kidney disease, stage 3a: Secondary | ICD-10-CM | POA: Diagnosis not present

## 2023-03-31 DIAGNOSIS — E871 Hypo-osmolality and hyponatremia: Secondary | ICD-10-CM | POA: Diagnosis not present

## 2023-04-01 ENCOUNTER — Telehealth: Payer: Self-pay

## 2023-04-01 ENCOUNTER — Other Ambulatory Visit: Payer: Medicaid Other

## 2023-04-01 ENCOUNTER — Telehealth: Payer: Self-pay | Admitting: Orthopaedic Surgery

## 2023-04-01 NOTE — Patient Outreach (Signed)
Care Management  Transitions of Care Program Transitions of Care Post-discharge week 2   04/01/2023 Name: Adam James MRN: [id number] DOB: 02-14-1971  Subjective: Adam James is a 52 y.o. year old male who is a primary care patient of Adam Register, MD. The Care Management team Engaged with patient Engaged with patient by telephone to assess and address transitions of care needs.   Consent to Services:  Patient was given information about care management services, agreed to services, and gave verbal consent to participate.   Assessment:           SDOH Interventions    Flowsheet Row Telephone from 04/01/2023 in [place] POPULATION HEALTH DEPARTMENT Telephone from 03/25/2023 in [place] POPULATION HEALTH DEPARTMENT Established CHF 20 from 02/23/2022 in [facility] Telephone from 02/19/2022 in [facility] Telephone from 02/04/2022 in [facility] Telephone from 01/12/2022 in [facility]  SDOH Interventions        Food Insecurity Interventions Intervention Not Indicated Intervention Not Indicated Assist with SNAP Application -- Other (Comment)  [referral to clinic food pantry, gift cards] Other (Comment)  [referral to clinic food pantry]  Housing Interventions Intervention Not Indicated Intervention Not Indicated -- -- -- [id number] Referral  Transportation Interventions -- Intervention Not Indicated -- -- -- --  Utilities Interventions Intervention Not Indicated Intervention Not Indicated -- -- -- --  Financial Strain Interventions -- -- -- --  [patient care fund] -- --  Health Literacy Interventions Intervention Not Indicated Intervention Not Indicated -- -- -- --        Goals Addressed             This Visit's Progress    Transition of Care       Current Barriers:  Falls  RNCM Clinical  Goal(s):  Patient will  remain free of falls  through collaboration with RN Care manager, provider, and care team.   Interventions: Evaluation of current treatment plan related to  self management and patient's adherence to plan as established by provider.    Falls Interventions:  (Status:  Goal on track:  Yes.) Short Term Goal Assess for environmental risk factors and home safety modifications Assessed SDOH needs  Collaborate with physical therapy -  Discuss use of assistive devicess and home safety modifications Discuss use of assistive devices Assessed mobility and home safety    Patient Goals/Self-Care Activities: Take medications as prescribed   Attend all scheduled provider appointments Perform all self care activities independently  Call provider office for new concerns or questions  Work with the social worker to address care coordination needs and will continue to work with the clinical team to address health care and disease management related needs  -  MSW through [facility] ordered upon discharge.  Schedule follow-up appointments with Orthopedic Surgery and Cardiology per discharge instructions. - Follow up appointment scheduled with Ortho,  on 9/10 at 3:15 Participate in Physical Therapy and Occupational Therapy as ordered - participating in PT, no report of recent falls.           Plan: The patient has been provided with contact information for the care management team and has been advised to call with any health related questions or concerns. Next TOC follow-up call with RNCM scheduled on Thursday, April 08, 2023 at 9 am.   Barbee Cough. Elanah Osmanovic, RN, BSN, MSN Care Management Coordinator/[facility] Phone Number:  [phone]

## 2023-04-01 NOTE — Telephone Encounter (Signed)
Gerri Spore (PT) from [facility] called requesting verbal orders for 2 wk 3. Gerri Spore secure number is [phone].

## 2023-04-01 NOTE — Telephone Encounter (Signed)
Verbal order left on VM  

## 2023-04-01 NOTE — Patient Instructions (Signed)
Visit Information  Thank you for taking time to visit with me today. Please don't hesitate to contact me if I can be of assistance to you before our next scheduled telephone appointment.  Our next appointment is by telephone on April 08, 2023 at 9 am.   Following is a copy of your care plan:   Goals Addressed             This Visit's Progress    Transition of Care       Current Barriers:  Falls  RNCM Clinical Goal(s):  Patient will  remain free of falls  through collaboration with RN Care manager, provider, and care team.   Interventions: Evaluation of current treatment plan related to  self management and patient's adherence to plan as established by provider.    Falls Interventions:  (Status:  Goal on track:  Yes.) Short Term Goal Assess for environmental risk factors and home safety modifications Assessed SDOH needs  Collaborate with physical therapy -  Discuss use of assistive devicess and home safety modifications Discuss use of assistive devices Assessed mobility and home safety    Patient Goals/Self-Care Activities: Take medications as prescribed   Attend all scheduled provider appointments Perform all self care activities independently  Call provider office for new concerns or questions  Work with the social worker to address care coordination needs and will continue to work with the clinical team to address health care and disease management related needs  -  MSW through [facility] ordered upon discharge.  Schedule follow-up appointments with Orthopedic Surgery and Cardiology per discharge instructions. - Follow up appointment scheduled with Ortho,  on 9/10 at 3:15 Participate in Physical Therapy and Occupational Therapy as ordered - participating in PT, no report of recent falls.           Patient verbalizes understanding of instructions and care plan provided today and agrees to view in MyChart. Active MyChart status and patient understanding  of how to access instructions and care plan via MyChart confirmed with patient.     Telephone follow up appointment with care management team member scheduled for: Thursday, April 08, 2023 at 9 am,   Please call the care guide team at [phone] if you need to cancel or reschedule your appointment.   Please call the [place] National Suicide Prevention Lifeline: (631)874-5515 or TTY: 517-209-4735 TTY 772-636-7727) to talk to a trained counselor if you are experiencing a Mental Health or Behavioral Health Crisis or need someone to talk to.  Sandar Krinke J. Cristela Felt, RN, BSN, MSN Care Management Coordinator/[place] Phone Number:  [phone]

## 2023-04-02 DIAGNOSIS — I255 Ischemic cardiomyopathy: Secondary | ICD-10-CM | POA: Diagnosis not present

## 2023-04-02 DIAGNOSIS — E871 Hypo-osmolality and hyponatremia: Secondary | ICD-10-CM | POA: Diagnosis not present

## 2023-04-02 DIAGNOSIS — E1122 Type 2 diabetes mellitus with diabetic chronic kidney disease: Secondary | ICD-10-CM | POA: Diagnosis not present

## 2023-04-02 DIAGNOSIS — E669 Obesity, unspecified: Secondary | ICD-10-CM | POA: Diagnosis not present

## 2023-04-02 DIAGNOSIS — Z89512 Acquired absence of left leg below knee: Secondary | ICD-10-CM | POA: Diagnosis not present

## 2023-04-02 DIAGNOSIS — S72011D Unspecified intracapsular fracture of right femur, subsequent encounter for closed fracture with routine healing: Secondary | ICD-10-CM | POA: Diagnosis not present

## 2023-04-02 DIAGNOSIS — I5042 Chronic combined systolic (congestive) and diastolic (congestive) heart failure: Secondary | ICD-10-CM | POA: Diagnosis not present

## 2023-04-02 DIAGNOSIS — E785 Hyperlipidemia, unspecified: Secondary | ICD-10-CM | POA: Diagnosis not present

## 2023-04-02 DIAGNOSIS — Z951 Presence of aortocoronary bypass graft: Secondary | ICD-10-CM | POA: Diagnosis not present

## 2023-04-02 DIAGNOSIS — D631 Anemia in chronic kidney disease: Secondary | ICD-10-CM | POA: Diagnosis not present

## 2023-04-02 DIAGNOSIS — S42001D Fracture of unspecified part of right clavicle, subsequent encounter for fracture with routine healing: Secondary | ICD-10-CM | POA: Diagnosis not present

## 2023-04-02 DIAGNOSIS — I130 Hypertensive heart and chronic kidney disease with heart failure and stage 1 through stage 4 chronic kidney disease, or unspecified chronic kidney disease: Secondary | ICD-10-CM | POA: Diagnosis not present

## 2023-04-02 DIAGNOSIS — N1831 Chronic kidney disease, stage 3a: Secondary | ICD-10-CM | POA: Diagnosis not present

## 2023-04-05 DIAGNOSIS — Z951 Presence of aortocoronary bypass graft: Secondary | ICD-10-CM | POA: Diagnosis not present

## 2023-04-05 DIAGNOSIS — D631 Anemia in chronic kidney disease: Secondary | ICD-10-CM | POA: Diagnosis not present

## 2023-04-05 DIAGNOSIS — E871 Hypo-osmolality and hyponatremia: Secondary | ICD-10-CM | POA: Diagnosis not present

## 2023-04-05 DIAGNOSIS — E1122 Type 2 diabetes mellitus with diabetic chronic kidney disease: Secondary | ICD-10-CM | POA: Diagnosis not present

## 2023-04-05 DIAGNOSIS — I5042 Chronic combined systolic (congestive) and diastolic (congestive) heart failure: Secondary | ICD-10-CM | POA: Diagnosis not present

## 2023-04-05 DIAGNOSIS — N1831 Chronic kidney disease, stage 3a: Secondary | ICD-10-CM | POA: Diagnosis not present

## 2023-04-05 DIAGNOSIS — S42001D Fracture of unspecified part of right clavicle, subsequent encounter for fracture with routine healing: Secondary | ICD-10-CM | POA: Diagnosis not present

## 2023-04-05 DIAGNOSIS — E669 Obesity, unspecified: Secondary | ICD-10-CM | POA: Diagnosis not present

## 2023-04-05 DIAGNOSIS — E785 Hyperlipidemia, unspecified: Secondary | ICD-10-CM | POA: Diagnosis not present

## 2023-04-05 DIAGNOSIS — I130 Hypertensive heart and chronic kidney disease with heart failure and stage 1 through stage 4 chronic kidney disease, or unspecified chronic kidney disease: Secondary | ICD-10-CM | POA: Diagnosis not present

## 2023-04-05 DIAGNOSIS — I255 Ischemic cardiomyopathy: Secondary | ICD-10-CM | POA: Diagnosis not present

## 2023-04-05 DIAGNOSIS — S72011D Unspecified intracapsular fracture of right femur, subsequent encounter for closed fracture with routine healing: Secondary | ICD-10-CM | POA: Diagnosis not present

## 2023-04-05 DIAGNOSIS — Z89512 Acquired absence of left leg below knee: Secondary | ICD-10-CM | POA: Diagnosis not present

## 2023-04-06 ENCOUNTER — Ambulatory Visit: Payer: Medicaid Other | Admitting: Orthopedic Surgery

## 2023-04-07 ENCOUNTER — Encounter (HOSPITAL_COMMUNITY): Payer: Self-pay

## 2023-04-07 ENCOUNTER — Other Ambulatory Visit: Payer: Self-pay | Admitting: Orthopaedic Surgery

## 2023-04-07 ENCOUNTER — Telehealth: Payer: Self-pay | Admitting: Orthopaedic Surgery

## 2023-04-07 ENCOUNTER — Ambulatory Visit (HOSPITAL_COMMUNITY)
Admission: RE | Admit: 2023-04-07 | Discharge: 2023-04-07 | Disposition: A | Discharge status: Home / Self Care | Payer: Medicare Other | Source: Ambulatory Visit | Attending: Physician Assistant | Admitting: Physician Assistant

## 2023-04-07 ENCOUNTER — Telehealth (HOSPITAL_COMMUNITY): Payer: Self-pay

## 2023-04-07 VITALS — BP 100/74 | HR 94 | Wt 242.4 lb

## 2023-04-07 DIAGNOSIS — Z7984 Long term (current) use of oral hypoglycemic drugs: Secondary | ICD-10-CM | POA: Diagnosis not present

## 2023-04-07 DIAGNOSIS — Z89512 Acquired absence of left leg below knee: Secondary | ICD-10-CM | POA: Diagnosis not present

## 2023-04-07 DIAGNOSIS — I5042 Chronic combined systolic (congestive) and diastolic (congestive) heart failure: Secondary | ICD-10-CM | POA: Diagnosis not present

## 2023-04-07 DIAGNOSIS — I2510 Atherosclerotic heart disease of native coronary artery without angina pectoris: Secondary | ICD-10-CM | POA: Diagnosis not present

## 2023-04-07 DIAGNOSIS — I471 Supraventricular tachycardia, unspecified: Secondary | ICD-10-CM | POA: Diagnosis not present

## 2023-04-07 DIAGNOSIS — I472 Ventricular tachycardia, unspecified: Secondary | ICD-10-CM | POA: Diagnosis not present

## 2023-04-07 DIAGNOSIS — N1831 Chronic kidney disease, stage 3a: Secondary | ICD-10-CM | POA: Diagnosis not present

## 2023-04-07 DIAGNOSIS — D631 Anemia in chronic kidney disease: Secondary | ICD-10-CM | POA: Diagnosis not present

## 2023-04-07 DIAGNOSIS — Z951 Presence of aortocoronary bypass graft: Secondary | ICD-10-CM | POA: Diagnosis not present

## 2023-04-07 DIAGNOSIS — I130 Hypertensive heart and chronic kidney disease with heart failure and stage 1 through stage 4 chronic kidney disease, or unspecified chronic kidney disease: Secondary | ICD-10-CM | POA: Insufficient documentation

## 2023-04-07 DIAGNOSIS — E1122 Type 2 diabetes mellitus with diabetic chronic kidney disease: Secondary | ICD-10-CM | POA: Diagnosis not present

## 2023-04-07 DIAGNOSIS — R9431 Abnormal electrocardiogram [ECG] [EKG]: Secondary | ICD-10-CM | POA: Diagnosis not present

## 2023-04-07 DIAGNOSIS — I255 Ischemic cardiomyopathy: Secondary | ICD-10-CM | POA: Insufficient documentation

## 2023-04-07 DIAGNOSIS — E785 Hyperlipidemia, unspecified: Secondary | ICD-10-CM | POA: Diagnosis not present

## 2023-04-07 DIAGNOSIS — E669 Obesity, unspecified: Secondary | ICD-10-CM | POA: Diagnosis not present

## 2023-04-07 DIAGNOSIS — N183 Chronic kidney disease, stage 3 unspecified: Secondary | ICD-10-CM | POA: Diagnosis not present

## 2023-04-07 DIAGNOSIS — S42001D Fracture of unspecified part of right clavicle, subsequent encounter for fracture with routine healing: Secondary | ICD-10-CM | POA: Diagnosis not present

## 2023-04-07 DIAGNOSIS — I5022 Chronic systolic (congestive) heart failure: Secondary | ICD-10-CM | POA: Diagnosis not present

## 2023-04-07 DIAGNOSIS — S72011D Unspecified intracapsular fracture of right femur, subsequent encounter for closed fracture with routine healing: Secondary | ICD-10-CM | POA: Diagnosis not present

## 2023-04-07 DIAGNOSIS — E871 Hypo-osmolality and hyponatremia: Secondary | ICD-10-CM | POA: Diagnosis not present

## 2023-04-07 DIAGNOSIS — I493 Ventricular premature depolarization: Secondary | ICD-10-CM | POA: Insufficient documentation

## 2023-04-07 LAB — COMPREHENSIVE METABOLIC PANEL
ALT: 17 U/L (ref 0–44)
AST: 16 U/L (ref 15–41)
Albumin: 3.3 g/dL — ABNORMAL LOW (ref 3.5–5.0)
Alkaline Phosphatase: 126 U/L (ref 38–126)
Anion gap: 6 (ref 5–15)
BUN: 17 mg/dL (ref 6–20)
CO2: 22 mmol/L (ref 22–32)
Calcium: 8.2 mg/dL — ABNORMAL LOW (ref 8.9–10.3)
Chloride: 110 mmol/L (ref 98–111)
Creatinine, Ser: 1.69 mg/dL — ABNORMAL HIGH (ref 0.61–1.24)
GFR, Estimated: 49 mL/min — ABNORMAL LOW (ref >60)
Glucose, Bld: 122 mg/dL — ABNORMAL HIGH (ref 70–99)
Potassium: 4.7 mmol/L (ref 3.5–5.1)
Sodium: 138 mmol/L (ref 135–145)
Total Bilirubin: 0.3 mg/dL (ref 0.3–1.2)
Total Protein: 6.6 g/dL (ref 6.5–8.1)

## 2023-04-07 LAB — BRAIN NATRIURETIC PEPTIDE: B Natriuretic Peptide: 57.8 pg/mL (ref 0.0–100.0)

## 2023-04-07 LAB — TSH: TSH: 3.621 u[IU]/mL (ref 0.350–4.500)

## 2023-04-07 MED ORDER — ROSUVASTATIN CALCIUM 20 MG PO TABS: 20.0000 mg | ORAL_TABLET | Freq: Every day | ORAL | 6 refills | Status: DC

## 2023-04-07 MED ORDER — CARVEDILOL 6.25 MG PO TABS: 6.2500 mg | ORAL_TABLET | Freq: Two times a day (BID) | ORAL | 6 refills | Status: DC

## 2023-04-07 MED ORDER — OXYCODONE-ACETAMINOPHEN 5-325 MG PO TABS: 1.0000 | ORAL_TABLET | Freq: Four times a day (QID) | ORAL | 0 refills | Status: DC | PRN

## 2023-04-07 MED ORDER — AMIODARONE HCL 200 MG PO TABS: 200.0000 mg | ORAL_TABLET | Freq: Every day | ORAL | 6 refills | Status: DC

## 2023-04-07 MED ORDER — EMPAGLIFLOZIN 10 MG PO TABS: 10.0000 mg | ORAL_TABLET | Freq: Every day | ORAL | 6 refills | Status: DC

## 2023-04-07 NOTE — Telephone Encounter (Signed)
Pt called requesting a immediate call back. Pt states he is experience severe right hip pains and had right hip replacement 03/20/23. Pt states pains are all the way down his leg. Please call pt at [phone].

## 2023-04-07 NOTE — Progress Notes (Signed)
Pt and dtr wanting to talk about CAPs program for pt dtr to get paid to care for pt at home.  CSW not famililar with ins and outs of this program but they spoke with a Child psychotherapist with the homecare agency coming to do home health with the patient.  CSW helped them figure out what agency they were through so they could reach out to follow up with her.  Encouraged them to take app to his PCP to have physician portion filled out.  No further questions or needs at this time  Burna Sis, LCSW Clinical Social Worker [facility] Desk#: [phone] Cell#: [phone]

## 2023-04-07 NOTE — Progress Notes (Addendum)
[facility] Note    PCP: Hoy Register, MD Primary Cardiologist: Norman Herrlich, MD  HF Cardiologist: Dr. Shirlee Latch  HPI: [name] is a 52 y.o.with a history CAD, Had PCI 2007 in [place]. 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  He had follow up with Dr Dulce Sellar 09/09/20 and was set up for cath. Cath with multivessel CAD. He was referred to SW due to financial concerns and no payor source. CMRI showed EF 40% and viable myocardium.      Admitted 02/25/2021 with left foot infection complicated by osteomyelitis and necrotizing fasciitis. 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 Hospital course complicated by erosive esophagitis on EGD. Recommendations for protonix 40 mg bid + repeat EGD in 8 weeks. Received multiple transfusions.   Admitted 03/23/21 with A/C CHF exacerbation. Cardiology consulted. CO-OX 55%.  Diuresed with IV lasix + metolazone.  Repeat echo on 03/23/21 with LVEF of 25%. RHC performed 03/26/21 showed preserved cardiac output, mildly elevated PCWP, and mild pulmonary venous hypertension. LHC 03/28/21 showed  minimal change from prior study in 2/22.   After full treatment of foot infection, he was admitted in 10/22 for CABG with LIMA-LAD, SVG-OM1, SVG-OM3. PDA too small to graft.   Underwent L BKA 1/23 due to osteomyelitis and abscess.  Admitted 4/23 with acute on chronic CHF. Diuresed with IV lasix. Echo in 4/23 showed EF 20-25%, RV moderately reduced, mild to moderate MR. GDMT restarted.   Echo 10/23 showed EF 30%, septal hypokinesis with septal-lateral dyssynchrony, mildly decreased RV systolic function, IVC normal.   Seen in ED 05/24 for SVT. Converted with IV amiodarone. Amiodarone increased to 200 mg daily.   S/p ICD 07/24.  He was readmitted in 08/24 after a right femoral neck fracture. He underwent  right direct anterior hip arthroplasty. Digoxin was discontinued. Coreg increased. At some point Eplerenone was stopped.   He is here today for follow-up. Accompanied by his daughter. He has been compliant with medications. No dyspnea, orthopnea, PND or lower extremity edema. He has been using Ozempic, frustrated that he hasn't lost weight. Tries to watch sodium intake but often eats packed foods like ramen noodles since they are affordable. He arrived in wheelchair but does ambulate with a walker when wearing his prosthesis.                                        PMH: 1. GI bleeding: Erosive esophagitis 8/22.  2. CKD stage 3 3. Type 2 diabetes 4. Hyperlipidemia 5. Left foot/leg diabetic infection with necrotizing fasciitis: 8/22, debridements were done and the left 5th metatarsal was amputated.  1/23 had left BKA.  6. Prior smoker 7. H/o PVC, VT: On amiodarone.  8. CAD: LHC in 9/22 with severe 3 vessel disease.  - CABG (10/22) with LIMA-LAD, SVG-OM2, SVG-OM3.  9. Chronic systolic CHF: Ischemic cardiomyopathy.  - Echo (8/22): LVEF of 25%, LV demonstrating global hypokinesis, and LV diastolic parameters consistent with grade II diastolic dysfunction.  - Echo (4/23): EF 20-25%, severe LV dysfunction, RV moderately reduced, RVSP 46.8 mmHg, mild to moderate MR - Echo (10/23): EF 30%, septal hypokinesis with septal-lateral dyssynchrony, mildly decreased RV systolic function, IVC normal.  - Echo (05/24): EF 25-30%, anteroseptal, inferoseptal and inferior HK, RV okay  Review of Systems: All systems reviewed and negative except  as per HPI.   Current Outpatient Medications  Medication Sig Dispense Refill   acetaminophen (TYLENOL) 325 MG tablet Take 2 tablets (650 mg total) by mouth every 6 (six) hours as needed for mild pain (or Fever >/= 101). 30 tablet 0   aspirin EC 81 MG tablet Take 1 tablet (81 mg total) by mouth 2 (two) times daily for 18 days. Swallow whole.  Once completed transition back to  once daily dosing as discussed. 36 tablet 0   Blood Pressure Monitoring (BLOOD PRESSURE CUFF) MISC 1 each by Does not apply route as directed. 1 each 0   gabapentin (NEURONTIN) 100 MG capsule Take 2 capsules (200 mg total) by mouth at bedtime. 60 capsule 0   glipiZIDE (GLUCOTROL) 5 MG tablet Take 1 tablet (5 mg total) by mouth daily before breakfast. 90 tablet 2   glucose blood (TRUE METRIX BLOOD GLUCOSE TEST) test strip Use as instructed twice daily 100 each 12   oxyCODONE-acetaminophen (PERCOCET/ROXICET) 5-325 MG tablet Take 1 tablet by mouth every 6 (six) hours as needed for severe pain. 30 tablet 0   rosuvastatin (CRESTOR) 20 MG tablet Take 20 mg by mouth daily.     Semaglutide, 1 MG/DOSE, 4 MG/3ML SOPN Inject 1 mg as directed once a week. (Patient taking differently: Inject 2 mg as directed once a week.) 3 mL 3   TRUEplus Lancets 28G MISC Use as directed twice daily at 8 am and 10 pm. 100 each 11   venlafaxine XR (EFFEXOR-XR) 75 MG 24 hr capsule TAKE 1 CAPSULE(75 MG) BY MOUTH DAILY WITH BREAKFAST 30 capsule 0   amiodarone (PACERONE) 200 MG tablet Take 1 tablet (200 mg total) by mouth daily. 30 tablet 6   carvedilol (COREG) 6.25 MG tablet Take 1 tablet (6.25 mg total) by mouth 2 (two) times daily with a meal. 60 tablet 6   empagliflozin (JARDIANCE) 10 MG TABS tablet Take 1 tablet (10 mg total) by mouth daily. Please make appointment with Dr. Alvis Lemmings for more refills. 30 tablet 6   rosuvastatin (CRESTOR) 20 MG tablet Take 1 tablet (20 mg total) by mouth daily. 30 tablet 6   No current facility-administered medications for this encounter.   Allergies  Allergen Reactions   Morphine Itching    Itching under skin   Social History   Socioeconomic History   Marital status: Single    Spouse name: Not on file   Number of children: 1   Years of education: Not on file   Highest education level: 8th grade  Occupational History   Occupation: none    Comment: former "[employer]"  Tobacco Use    Smoking status: Former    Current packs/day: 0.00    Average packs/day: 3.0 packs/day for 36.0 years (108.0 ttl pk-yrs)    Types: Cigarettes    Start date: 08/27/1984    Quit date: 08/27/2020    Years since quitting: 2.6   Smokeless tobacco: Never  Vaping Use   Vaping status: Never Used  Substance and Sexual Activity   Alcohol use: Not Currently    Comment: Very rare   Drug use: Not Currently    Types: Marijuana    Comment: smoked marijuana in his youth(teenager)   Sexual activity: Not on file  Other Topics Concern   Not on file  Social History Narrative   Not on file   Social Determinants of Health   Financial Resource Strain: High Risk (02/20/2022)   Overall Financial Resource Strain (CARDIA)    Difficulty  of Paying Living Expenses: Hard  Food Insecurity: No Food Insecurity (04/01/2023)   Hunger Vital Sign    Worried About Running Out of Food in the Last Year: Never true    Ran Out of Food in the Last Year: Never true  Transportation Needs: No Transportation Needs (04/01/2023)   PRAPARE - Administrator, Civil Service (Medical): No    Lack of Transportation (Non-Medical): No  Recent Concern: Transportation Needs - Unmet Transportation Needs (03/19/2023)   PRAPARE - Administrator, Civil Service (Medical): Yes    Lack of Transportation (Non-Medical): No  Physical Activity: Not on file  Stress: Not on file  Social Connections: Not on file  Intimate Partner Violence: Patient Declined (03/25/2023)   Humiliation, Afraid, Rape, and Kick questionnaire    Fear of Current or Ex-Partner: Patient declined    Emotionally Abused: Patient declined    Physically Abused: Patient declined    Sexually Abused: Patient declined   Family History  Problem Relation Age of Onset   Hypertension Mother    Heart failure Father    Hypertension Brother    Congestive Heart Failure Brother    BP 100/74   Pulse 94   Wt 110 kg (242 lb 6.4 oz)   SpO2 97%   BMI 30.30 kg/m    Wt Readings from Last 3 Encounters:  04/07/23 110 kg (242 lb 6.4 oz)  03/21/23 112.6 kg (248 lb 3.8 oz)  02/24/23 99.8 kg (220 lb)   PHYSICAL EXAM: General:  Well appearing. Arrived in wheelchair. HEENT: normal Neck: supple. no JVD. Carotids 2+ bilat; no bruits.  Cor: PMI nondisplaced. Regular rate & rhythm. No rubs, gallops or murmurs. Lungs: clear Abdomen: soft, nontender, nondistended.  Extremities: no cyanosis, clubbing, rash, L BKA/wearing prosthesis Neuro: alert & orientedx3. Affect pleasant  ICD interrogation: CorVue above threshold, no VT/VF  ECG: Sinus rhythm 96 bpm  ASSESSMENT & PLAN: 1. Chronic systolic CHF: Ischemic cardiomyopathy.  Initial echo in 2/22 with EF 40-45%.  Echo in 7/22 with EF 30-35%.  Echo 8/22 EF down to 25% with mild RV dysfunction and dilated IVC.  RHC 8/22 with preserved CO, mildly elevated PCWP, mild pulmonary hypertension. S/p CABG 10/22. Readmitted 4/23 with CHF, had been off diuretics and missed follow up. Echo in 4/23 with EF 20-25%, RV moderately reduced, mild to moderate MR. Echo 10/23 EF 30%, septal hypokinesis with septal-lateral dyssynchrony, mildly decreased RV systolic function, IVC normal.  Echo 05/24: EF 25-30%, RV okay. NYHA II, though functional class difficult due to L BKA.  He is not volume overloaded on exam or by CorVue thoracic impedance. - Continue Jardiance 10 mg daily. No GU symptoms. - Continue Coreg 6.25 mg BID. - Digoxin recently stopped - Restart eplerenone if Scr stable (up to low 2s during recent admit) on today's labs. - Stopped Entresto at some point. Can add back neck.  2. CAD: Hx PCI 2007. S/p CABG x 3 with LIMA-LAD, SVG-OM2, and SVG-OM3 10/22. No chest pain.  - Continue  ASA 81 daily.  - Continue Rosuvastatin 20 mg daily 3. PVCs/VT: Noted 10/22 post CABG. No PVCs on ECG today. - Continue amiodarone 200 mg daily.  Check TSH and LFTs, he will need regular eye exam.  4. SVT: Episode 05/24. Converted with IV  amiodarone. -Continue Amiodarone 200 mg daily 5. CKD stage 3: Scr baseline 1.6-1.8, recently up to low 2s. BMET today.  6. GI bleeding: History of erosive esophagitis in 8/22.   7.  Left leg diabetic foot infection: Underwent left BKA 1/23. And revision of L BKA 10/23. Left stump now healed.  - Has prosthesis 8. DM2:  hgb A1C 9.5 (5/24). PCP managing. - On ozempic and Jardiance  Follow-up 2 months with APP  [facility], Adam James N PA-C 04/07/23

## 2023-04-07 NOTE — Telephone Encounter (Signed)
I called and moved appt up for tomorrow since he is almost 3 weeks out with [name] and hasn't been seen yet. He said he is out of pain medication and tylenol isnt helping. Please advise

## 2023-04-07 NOTE — Telephone Encounter (Signed)
Called to confirm/remind patient of their appointment at the [facility] on 04/07/23.   Patient reminded to bring all medications and/or complete list.  Confirmed patient has transportation. Gave directions, instructed to utilize valet parking.  Confirmed appointment prior to ending call.

## 2023-04-07 NOTE — Telephone Encounter (Signed)
Called and advised pt.

## 2023-04-07 NOTE — Patient Instructions (Addendum)
Thank you for coming in today  If you had labs drawn today, any labs that are abnormal the clinic will call you No news is good news  Medications: No changes   Follow up appointments:  Your physician recommends that you schedule a follow-up appointment in:  2 months in clinic   Do the following things EVERYDAY: Weigh yourself in the morning before breakfast. Write it down and keep it in a log. Take your medicines as prescribed Eat low salt foods--Limit salt (sodium) to 2000 mg per day.  Stay as active as you can everyday Limit all fluids for the day to less than 2 liters   At the [facility], you and your health needs are our priority. As part of our continuing mission to provide you with exceptional heart care, we have created designated Provider Care Teams. These Care Teams include your primary Cardiologist (physician) and Advanced Practice Providers (APPs- Physician Assistants and Nurse Practitioners) who all work together to provide you with the care you need, when you need it.   You may see any of the following providers on your designated Care Team at your next follow up: Dr Arvilla Meres Dr Marca Ancona Dr. Marcos Eke, NP Robbie Lis, [name] [facility] [place], [name] Brynda Peon, NP Karle Plumber, PharmD   Please be sure to bring in all your medications bottles to every appointment.    Thank you for choosing [facility]  If you have any questions or concerns before your next appointment please send [place] a message through [place] or call our office at [phone].    TO LEAVE A MESSAGE FOR THE NURSE SELECT OPTION 2, PLEASE LEAVE A MESSAGE INCLUDING: YOUR NAME DATE OF BIRTH CALL BACK NUMBER REASON FOR CALL**this is important as we prioritize the call backs  YOU WILL RECEIVE A CALL BACK THE SAME DAY AS LONG AS YOU CALL BEFORE 4:00 PM

## 2023-04-08 ENCOUNTER — Other Ambulatory Visit: Payer: Medicaid Other

## 2023-04-08 ENCOUNTER — Encounter: Payer: Self-pay | Admitting: Physician Assistant

## 2023-04-08 ENCOUNTER — Telehealth: Payer: Self-pay

## 2023-04-08 ENCOUNTER — Telehealth (HOSPITAL_COMMUNITY): Payer: Self-pay

## 2023-04-08 ENCOUNTER — Ambulatory Visit (INDEPENDENT_AMBULATORY_CARE_PROVIDER_SITE_OTHER): Payer: Medicaid Other | Admitting: Physician Assistant

## 2023-04-08 DIAGNOSIS — Z96641 Presence of right artificial hip joint: Secondary | ICD-10-CM

## 2023-04-08 DIAGNOSIS — I5022 Chronic systolic (congestive) heart failure: Secondary | ICD-10-CM

## 2023-04-08 MED ORDER — EPLERENONE 25 MG PO TABS: 25.0000 mg | ORAL_TABLET | Freq: Every day | ORAL | 11 refills | Status: DC

## 2023-04-08 NOTE — Telephone Encounter (Addendum)
Patient's Eplerenone medication has been sent to his pharmacy and his med list has been updated. In addition, his labs has been placed and his appointment scheduled. Pt aware, agreeable, and verbalized understanding  ----- Message from [facility], [facility] sent at 04/07/2023  5:48 PM EDT ----- Restart eplerenone 25 mg daily. BMET 1 week

## 2023-04-08 NOTE — Patient Instructions (Signed)
Visit Information  Thank you for taking time to visit with me today. Please don't hesitate to contact me if I can be of assistance to you before our next scheduled telephone appointment.  Our next appointment is by telephone on September 19 at 9am  Following is a copy of your care plan:   Goals Addressed             This Visit's Progress    Transition of Care       Current Barriers:  Falls  RNCM Clinical Goal(s):  Patient will  remain free of falls  through collaboration with RN Care manager, provider, and care team.   Interventions: Evaluation of current treatment plan related to  self management and patient's adherence to plan as established by provider.    Falls Interventions:  (Status:  Goal on track:  Yes.) Short Term Goal Assess for environmental risk factors and home safety modifications Assessed SDOH needs  Collaborate with physical therapy - [facility]; 9/12 telephone visit update: patient's [facility] PT is coming to an end, has upcoming appointment with Dr. Magnus Ivan and will discuss obtaining order for PT services extension either through current [facility] or as an Outpatient Discuss use of assistive devicess and home safety modifications Discuss use of assistive devices Assessed mobility and home safety    Patient Goals/Self-Care Activities: Take medications as prescribed   Attend all scheduled provider appointments Perform all self care activities independently  Call provider office for new concerns or questions  Work with the social worker to address care coordination needs and will continue to work with the clinical team to address health care and disease management related needs  -  MSW through [facility] ordered upon discharge.  Schedule follow-up appointments with Orthopedic Surgery and Cardiology per discharge instructions. - Follow up appointment completed with Ortho,  on 9/10 at 3:15 Participate in Physical Therapy and Occupational Therapy as ordered -  participating in PT, no report of recent falls.           The patient verbalized understanding of instructions, educational materials, and care plan provided today and DECLINED offer to receive copy of patient instructions, educational materials, and care plan.   The patient has been provided with contact information for the care management team and has been advised to call with any health related questions or concerns.   Please call the care guide team at [phone] if you need to cancel or reschedule your appointment.   Please call 1-800-273-TALK (toll free, 24 hour hotline) if you are experiencing a Mental Health or Behavioral Health Crisis or need someone to talk to.  Alyse Low, RN, BA, [facility], CRRN [facility] Coordinator, Transition of Care Ph # [phone]

## 2023-04-08 NOTE — Telephone Encounter (Signed)
Duplicate entry - refer to [facility] program note completed 04/08/23

## 2023-04-08 NOTE — Patient Outreach (Signed)
Care Management  Transitions of Care Program Managed Medicaid Transitions of Care week 3   04/08/2023 Name: Adam James MRN: [id number] DOB: 14-Jul-1971  Subjective: Adam James is a 52 y.o. year old male who is a primary care patient of Hoy Register, MD. The Care Management team Engaged with patient Engaged with patient by telephone to assess and address transitions of care needs.   Consent to Services:  Patient was given information about Managed Medicaid Care Management services, agreed to services, and gave verbal consent to participate.   Assessment:           SDOH Interventions    Flowsheet Row Patient Outreach from 04/08/2023 in [place] POPULATION HEALTH DEPARTMENT Telephone from 04/01/2023 in [place] POPULATION HEALTH DEPARTMENT Telephone from 03/25/2023 in [place] POPULATION HEALTH DEPARTMENT Established CHF 20 from 02/23/2022 in [facility] Clin[facility] Telephone from 02/19/2022 in [facility] Telephone from 02/04/2022 in [facility]  SDOH Interventions        Food Insecurity Interventions Intervention Not Indicated Intervention Not Indicated Intervention Not Indicated Assist with SNAP Application -- Other (Comment)  [referral to clinic food pantry, gift cards]  Housing Interventions -- Intervention Not Indicated Intervention Not Indicated -- -- --  Transportation Interventions Intervention Not Indicated -- Intervention Not Indicated -- -- --  Utilities Interventions Intervention Not Indicated Intervention Not Indicated Intervention Not Indicated -- -- --  Financial Strain Interventions -- -- -- -- --  [patient care fund] --  Health Literacy Interventions Intervention Not Indicated Intervention Not Indicated Intervention Not Indicated -- -- --        Goals Addressed             This Visit's Progress    Transition of Care        Current Barriers:  Falls  RNCM Clinical Goal(s):  Patient will  remain free of falls  through collaboration with RN Care manager, provider, and care team.   Interventions: Evaluation of current treatment plan related to  self management and patient's adherence to plan as established by provider.    Falls Interventions:  (Status:  Goal on track:  Yes.) Short Term Goal Assess for environmental risk factors and home safety modifications Assessed SDOH needs  Collaborate with physical therapy - [facility]; 9/12 telephone visit update: patient's HHS PT is coming to an end, has upcoming appointment with Dr. Magnus Ivan and will discuss obtaining order for PT services extension either through current HHS or as an Outpatient Discuss use of assistive devicess and home safety modifications Discuss use of assistive devices Assessed mobility and home safety    Patient Goals/Self-Care Activities: Take medications as prescribed   Attend all scheduled provider appointments Perform all self care activities independently  Call provider office for new concerns or questions  Work with the social worker to address care coordination needs and will continue to work with the clinical team to address health care and disease management related needs  -  MSW through [facility] ordered upon discharge.  Schedule follow-up appointments with Orthopedic Surgery and Cardiology per discharge instructions. - Follow up appointment completed with Ortho,  on 9/10 at 3:15 Participate in Physical Therapy and Occupational Therapy as ordered - participating in PT, no report of recent falls.           Plan: The patient has been provided with contact information for the care management team and has  been advised to call with any health related questions or concerns.   Alyse Low, RN, BA, [facility], CRRN [facility] Coordinator, Transition of Care Ph # [phone]

## 2023-04-08 NOTE — Progress Notes (Signed)
HPI: Mr. 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  Patient underwent surgery due to a right femoral neck fracture.  He has been taking aspirin for DVT prophylaxis.  He is on aspirin prior to surgery.  He states he continues to have significant pain in his right thigh.  He is having to take oxycodone.  He has had no fevers chills.  He also has history of left below knee amputation.  He states having trouble gait balance.  Difficulty with showering due to the fact that the shower chair does not fit within his shower.  Review of systems see HPI otherwise negative  Physical exam: General: Well-developed well-nourished male in no acute distress. Right hip surgical incisions healing well no signs of infection.  Staples well-approximated the incision.  Calf supple nontender dorsiflexion plantarflexion right ankle intact.  Impression: Status post right total hip arthroplasty  Plan: Staples were harvested today Steri-Strips applied.  Get the incision wet in the shower.  He is given a prescription for a shower bench.  He was also given a prescription for outpatient therapy to work on gait balance and overall right leg strengthening.  Questions were encouraged and answered at length.  Will go back on his 81 mg aspirin he was taking prior to surgery.  Follow-up with [place] in 1 month sooner if there is any signs of infection or questions or concerns.

## 2023-04-09 ENCOUNTER — Telehealth: Payer: Self-pay | Admitting: Orthopaedic Surgery

## 2023-04-09 NOTE — Telephone Encounter (Signed)
Patient aware that I am working on this  PA is pending

## 2023-04-09 NOTE — Telephone Encounter (Signed)
Patient called and said the pharmacy needs prior authorization for the Oxycodone. CB#[phone].

## 2023-04-12 NOTE — Telephone Encounter (Signed)
Patient aware this was approved, message left with pharmacy aswell

## 2023-04-14 ENCOUNTER — Ambulatory Visit: Payer: Medicaid Other | Admitting: Orthopaedic Surgery

## 2023-04-15 ENCOUNTER — Other Ambulatory Visit (HOSPITAL_COMMUNITY): Payer: Medicaid Other

## 2023-04-15 ENCOUNTER — Other Ambulatory Visit: Payer: Medicaid Other

## 2023-04-15 ENCOUNTER — Telehealth: Payer: Self-pay

## 2023-04-15 NOTE — Patient Outreach (Signed)
Care Management  Transitions of Care Program Managed Medicaid Transitions of Care week 4   04/15/2023 Name: Adam James MRN: [id number] DOB: 1971/07/07  Subjective: Adam James is a 52 y.o. year old male who is a primary care patient of Hoy Register, MD. The Care Management team Engaged with patient Engaged with patient by telephone to assess and address transitions of care needs.   Consent to Services:  Patient was given information about Managed Medicaid Care Management services, agreed to services, and gave verbal consent to participate.   Assessment:           SDOH Interventions    Flowsheet Row Patient Outreach from 04/08/2023 in [place] POPULATION HEALTH DEPARTMENT Telephone from 04/01/2023 in [place] POPULATION HEALTH DEPARTMENT Telephone from 03/25/2023 in [place] POPULATION HEALTH DEPARTMENT Established CHF 20 from 02/23/2022 in [facility] Special[facility] Clin[facility] Telephone from 02/19/2022 in [facility] Telephone from 02/04/2022 in [facility]  SDOH Interventions        Food Insecurity Interventions Intervention Not Indicated Intervention Not Indicated Intervention Not Indicated Assist with SNAP Application -- Other (Comment)  [referral to clinic food pantry, gift cards]  Housing Interventions -- Intervention Not Indicated Intervention Not Indicated -- -- --  Transportation Interventions Intervention Not Indicated -- Intervention Not Indicated -- -- --  Utilities Interventions Intervention Not Indicated Intervention Not Indicated Intervention Not Indicated -- -- --  Financial Strain Interventions -- -- -- -- --  [patient care fund] --  Health Literacy Interventions Intervention Not Indicated Intervention Not Indicated Intervention Not Indicated -- -- --        Goals Addressed             This Visit's Progress    COMPLETED: Transition of Care        Current Barriers:  Falls  RNCM Clinical Goal(s):  Patient will  remain free of falls  through collaboration with RN Care manager, provider, and care team.   Interventions: Evaluation of current treatment plan related to  self management and patient's adherence to plan as established by provider.    Falls Interventions:  (Status:  Goal on track:  Yes.) Short Term Goal Assess for environmental risk factors and home safety modifications Assessed SDOH needs  Collaborate with physical therapy - [facility]; 9/12 telephone visit update: patient's HHS PT is coming to an end, has upcoming appointment with Dr. Magnus Ivan and will discuss obtaining order for PT services extension either through current HHS or as an Outpatient 9/19 final telephone visit update - patient states he is doing well, gait improving, free from falls, and was able to get refill for Percocet 5-325mg , 1 tab q 6hrs prn for moderate post op pain to his R hip. Discuss use of assistive devicess and home safety modifications Discuss use of assistive devices Assessed mobility and home safety    Patient Goals/Self-Care Activities: Take medications as prescribed   Attend all scheduled provider appointments Perform all self care activities independently  Call provider office for new concerns or questions  Work with the social worker to address care coordination needs and will continue to work with the clinical team to address health care and disease management related needs  -  MSW through [facility] ordered upon discharge.  Schedule follow-up appointments with Orthopedic Surgery and Cardiology per discharge instructions. - Follow up appointment completed with Ortho,  on 9/10 at 3:15 Participate in Physical  Therapy and Occupational Therapy as ordered - participating in PT, no report of recent falls.           Plan: The patient has been provided with contact information for the care management team and has been  advised to call with any health related questions or concerns.   Alyse Low, RN, BA, [facility], CRRN [facility] Coordinator, Transition of Care Ph # [phone]

## 2023-04-15 NOTE — Patient Instructions (Signed)
Visit Information  Thank you for taking time to speak with me today. Congratulations for "graduating", so to speak, from our Transition of Care program following your hospitalization for repair of your R hip fracture. Please don't hesitate to contact me if I can be of assistance to you in the future.   Following is a copy of your care plan:   Goals Addressed             This Visit's Progress    COMPLETED: Transition of Care       Current Barriers:  Falls  RNCM Clinical Goal(s):  Patient will  remain free of falls  through collaboration with RN Care manager, provider, and care team.   Interventions: Evaluation of current treatment plan related to  self management and patient's adherence to plan as established by provider.    Falls Interventions:  (Status:  Goal on track:  Yes.) Short Term Goal Assess for environmental risk factors and home safety modifications Assessed SDOH needs  Collaborate with physical therapy - [facility]; 9/12 telephone visit update: patient's HHS PT is coming to an end, has upcoming appointment with Dr. Magnus Ivan and will discuss obtaining order for PT services extension either through current HHS or as an Outpatient 9/19 final telephone visit update - patient states he is doing well, gait improving, free from falls, and was able to get refill for Percocet 5-325mg , 1 tab q 6hrs prn for moderate post op pain to his R hip. Discuss use of assistive devicess and home safety modifications Discuss use of assistive devices Assessed mobility and home safety    Patient Goals/Self-Care Activities: Take medications as prescribed   Attend all scheduled provider appointments Perform all self care activities independently  Call provider office for new concerns or questions  Work with the social worker to address care coordination needs and will continue to work with the clinical team to address health care and disease management related needs  -  MSW through  [facility] ordered upon discharge.  Schedule follow-up appointments with Orthopedic Surgery and Cardiology per discharge instructions. - Follow up appointment completed with Ortho,  on 9/10 at 3:15 Participate in Physical Therapy and Occupational Therapy as ordered - participating in PT, no report of recent falls.           Patient verbalizes understanding of instructions and care plan provided today and agrees to view in MyChart. Active MyChart status and patient understanding of how to access instructions and care plan via MyChart confirmed with patient.     The patient has been provided with contact information for the care management team and has been advised to call with any health related questions or concerns.   Please call the care guide team at [phone] if you need to cancel or reschedule your appointment.   Please call 1-800-273-TALK (toll free, 24 hour hotline) if you are experiencing a Mental Health or Behavioral Health Crisis or need someone to talk to.  Alyse Low, RN, BA, [facility], CRRN [facility] Coordinator, Transition of Care Ph # [phone]

## 2023-05-06 ENCOUNTER — Telehealth: Payer: Self-pay | Admitting: Family Medicine

## 2023-05-06 NOTE — Telephone Encounter (Signed)
The patient states he recently spoke to the pharmacy and there has been issues with his Semaglutide, 1 MG/DOSE, 4 MG/3ML SOPN . He wants to know if it is time to go to the 2 mg he spoke with his provider about. Please assist patient further as he uses   [id number] [place], [place] - [address] Phone: [phone]  Fax: [phone]

## 2023-05-07 ENCOUNTER — Other Ambulatory Visit: Payer: Self-pay

## 2023-05-07 ENCOUNTER — Other Ambulatory Visit: Payer: Self-pay | Admitting: Pharmacist

## 2023-05-07 ENCOUNTER — Telehealth: Payer: Self-pay

## 2023-05-07 MED ORDER — SEMAGLUTIDE (2 MG/DOSE) 8 MG/3ML ~~LOC~~ SOPN: 2.0000 mg | PEN_INJECTOR | SUBCUTANEOUS | 0 refills | Status: DC

## 2023-05-07 NOTE — Telephone Encounter (Signed)
Luke, I am not in the office today and I do not fully understand this message. Can you please give him a can and assist with his Ozempic dosing? Thanks

## 2023-05-07 NOTE — Telephone Encounter (Signed)
Thank you friend! Patient called and informed.

## 2023-05-07 NOTE — Telephone Encounter (Signed)
Pharmacy Patient Advocate Encounter   Received notification from Physician's Office that prior authorization for [facility] is required/requested.   Insurance verification completed.   The patient is insured through [facility] MEDICAID .   Per test claim: PA required; PA submitted to [facility] MEDICAID via [place] [employer]/confirmation #/[id number] W Status is pending

## 2023-05-07 NOTE — Telephone Encounter (Signed)
Yes ma'am, call returned to patient. Verified I was speaking with him using two identifiers. I introduced myself and the reason for my call. I also reviewed notes from your last visit with him May.  Per that note, plan was to increase to 0.5 mg weekly x4 weeks and then increase further to 1 mg thereafter. Confirmed with patient and via pharmacy dispense report that he filled 0.5mg  on 12/02/22, increased to and  filled 1mg  rxn on 12/18/22. He has taken the 1mg  dose consistently since then. Denies any NV, abdominal pain. No change in vision. Denies any hypoglycemia.   He wonders if he can increase further to the 2mg . Given the above clinical information I gave them the okay to do so and sent in the 2mg  pen. However, I did tell him that he is due for a visit with you. Coordinated with our front staff and scheduled him for an OV with you on 06/01/2023.   Concerning the "problem with his Ozempic": he recently was approved for Medicaid and now has dual coverage with Medicaid/Medicare. His insurance is likely requiring a PA. [place] in.   [place],   Can we start a PA for this patient?

## 2023-05-07 NOTE — Telephone Encounter (Signed)
Pharmacy Patient Advocate Encounter  Received notification from [facility] MEDICAID that Prior Authorization for [facility] has been APPROVED from 05/07/2023 to 05/06/2024   PA #/Case ID/Reference #: [id number] W  The only insurance I found for him was standard Medicaid which I was able to get an approval for today.

## 2023-05-10 ENCOUNTER — Encounter: Payer: Medicare Other | Admitting: Physician Assistant

## 2023-05-18 ENCOUNTER — Other Ambulatory Visit: Payer: Self-pay | Admitting: Family Medicine

## 2023-05-18 NOTE — Telephone Encounter (Unsigned)
Copied from CRM [id number]. Topic: General - Other >> May 18, 2023  8:35 AM Everette C wrote: Reason for CRM: Medication Refill - Medication: Semaglutide, 2 MG/DOSE, 8 MG/3ML SOPN [[id number]]  Lafonda Mosses has noted that a dx code MUST be included in the prescription for insurance coverage   Has the patient contacted their pharmacy? Yes.   (Agent: If no, request that the patient contact the pharmacy for the refill. If patient does not wish to contact the pharmacy document the reason why and proceed with request.) (Agent: If yes, when and what did the pharmacy advise?)  Preferred Pharmacy (with phone number or street name): [facility] #[id number] [facility], [place] - [address] [place] [address] Phone: [phone] Fax: [phone] Hours: Not open 24 hours   Has the patient been seen for an appointment in the last year OR does the patient have an upcoming appointment? Yes.    Agent: Please be advised that RX refills may take up to 3 business days. We ask that you follow-up with your pharmacy.

## 2023-05-19 NOTE — Telephone Encounter (Signed)
Request is too soon, Last refill 05/07/23.  Requested Prescriptions  Pending Prescriptions Disp Refills   Semaglutide, 2 MG/DOSE, 8 MG/3ML SOPN 9 mL 0    Sig: Inject 2 mg as directed once a week.     Endocrinology:  Diabetes - GLP-1 Receptor Agonists - semaglutide Failed - 05/18/2023  9:13 AM      Failed - HBA1C in normal range and within 180 days    HbA1c, POC (controlled diabetic range)  Date Value Ref Range Status  12/02/2022 9.5 (A) 0.0 - 7.0 % Final         Failed - Cr in normal range and within 360 days    Creatinine, Ser  Date Value Ref Range Status  04/07/2023 1.69 (H) 0.61 - 1.24 mg/dL Final   Creatinine, Urine  Date Value Ref Range Status  03/02/2021 100.20 mg/dL Final         Passed - Valid encounter within last 6 months    Recent Outpatient Visits           5 months ago Type 2 diabetes mellitus with other circulatory complication, without long-term current use of insulin (HCC)   [place] [facility] [place], Odette Horns, MD   8 months ago Type 2 diabetes mellitus with other circulatory complication, without long-term current use of insulin 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

## 2023-05-21 ENCOUNTER — Telehealth: Payer: Self-pay | Admitting: Orthopedic Surgery

## 2023-05-21 NOTE — Telephone Encounter (Signed)
Patient called and said he needs a new prescription for his fit. 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 CB#[phone]

## 2023-05-24 NOTE — Telephone Encounter (Signed)
Rx written, in out box for medical records to fax to [facility].

## 2023-05-28 ENCOUNTER — Ambulatory Visit: Payer: Medicare Other | Admitting: Internal Medicine

## 2023-06-01 ENCOUNTER — Ambulatory Visit: Payer: Medicare Other | Attending: Family Medicine | Admitting: Family Medicine

## 2023-06-01 ENCOUNTER — Encounter: Payer: Self-pay | Admitting: Family Medicine

## 2023-06-01 VITALS — BP 110/74 | HR 96 | Ht 75.0 in | Wt 252.0 lb

## 2023-06-01 DIAGNOSIS — R29898 Other symptoms and signs involving the musculoskeletal system: Secondary | ICD-10-CM | POA: Insufficient documentation

## 2023-06-01 DIAGNOSIS — S42001A Fracture of unspecified part of right clavicle, initial encounter for closed fracture: Secondary | ICD-10-CM | POA: Insufficient documentation

## 2023-06-01 DIAGNOSIS — I5022 Chronic systolic (congestive) heart failure: Secondary | ICD-10-CM | POA: Insufficient documentation

## 2023-06-01 DIAGNOSIS — Z79899 Other long term (current) drug therapy: Secondary | ICD-10-CM | POA: Diagnosis not present

## 2023-06-01 DIAGNOSIS — X58XXXA Exposure to other specified factors, initial encounter: Secondary | ICD-10-CM | POA: Diagnosis not present

## 2023-06-01 DIAGNOSIS — Z7985 Long-term (current) use of injectable non-insulin antidiabetic drugs: Secondary | ICD-10-CM | POA: Insufficient documentation

## 2023-06-01 DIAGNOSIS — Z87891 Personal history of nicotine dependence: Secondary | ICD-10-CM | POA: Insufficient documentation

## 2023-06-01 DIAGNOSIS — Z951 Presence of aortocoronary bypass graft: Secondary | ICD-10-CM | POA: Diagnosis not present

## 2023-06-01 DIAGNOSIS — E1159 Type 2 diabetes mellitus with other circulatory complications: Secondary | ICD-10-CM | POA: Diagnosis present

## 2023-06-01 DIAGNOSIS — R531 Weakness: Secondary | ICD-10-CM | POA: Diagnosis not present

## 2023-06-01 DIAGNOSIS — N1831 Chronic kidney disease, stage 3a: Secondary | ICD-10-CM | POA: Diagnosis not present

## 2023-06-01 DIAGNOSIS — Z7984 Long term (current) use of oral hypoglycemic drugs: Secondary | ICD-10-CM | POA: Insufficient documentation

## 2023-06-01 DIAGNOSIS — I110 Hypertensive heart disease with heart failure: Secondary | ICD-10-CM

## 2023-06-01 DIAGNOSIS — I129 Hypertensive chronic kidney disease with stage 1 through stage 4 chronic kidney disease, or unspecified chronic kidney disease: Secondary | ICD-10-CM

## 2023-06-01 DIAGNOSIS — F419 Anxiety disorder, unspecified: Secondary | ICD-10-CM | POA: Diagnosis not present

## 2023-06-01 DIAGNOSIS — I130 Hypertensive heart and chronic kidney disease with heart failure and stage 1 through stage 4 chronic kidney disease, or unspecified chronic kidney disease: Secondary | ICD-10-CM | POA: Diagnosis not present

## 2023-06-01 DIAGNOSIS — N1832 Chronic kidney disease, stage 3b: Secondary | ICD-10-CM

## 2023-06-01 DIAGNOSIS — E1122 Type 2 diabetes mellitus with diabetic chronic kidney disease: Secondary | ICD-10-CM | POA: Insufficient documentation

## 2023-06-01 DIAGNOSIS — F32A Depression, unspecified: Secondary | ICD-10-CM | POA: Insufficient documentation

## 2023-06-01 LAB — POCT GLYCOSYLATED HEMOGLOBIN (HGB A1C): HbA1c, POC (controlled diabetic range): 8.1 % — AB (ref 0.0–7.0)

## 2023-06-01 MED ORDER — GLIPIZIDE 5 MG PO TABS: 5.0000 mg | ORAL_TABLET | Freq: Every day | ORAL | 1 refills | Status: DC

## 2023-06-01 MED ORDER — SEMAGLUTIDE (2 MG/DOSE) 8 MG/3ML ~~LOC~~ SOPN: 2.0000 mg | PEN_INJECTOR | SUBCUTANEOUS | 6 refills | Status: DC

## 2023-06-01 NOTE — Patient Instructions (Signed)
VISIT SUMMARY:  You came in today for a follow-up visit. We discussed your recent falls, resulting in a broken collarbone and hip replacement surgery. You are currently undergoing physical therapy for your hip but have concerns about weakness in your shoulder. We also reviewed your diabetes management and your feelings about discontinuing Effexor for anxiety and depression. Additionally, we discussed your general health maintenance, including your recent eye exam and the need to check your kidney function.  YOUR PLAN:  -RIGHT CLAVICLE FRACTURE: A clavicle fracture is a break in the collarbone. You are experiencing weakness in your right arm, especially when lifting objects. We will refer you to physical therapy to help strengthen your shoulder.  -RIGHT HIP REPLACEMENT: You recently had hip replacement surgery and are currently receiving physical therapy. Continue with your current physical therapy regimen as it seems to be helping.  -TYPE 2 DIABETES MELLITUS: Type 2 diabetes is a condition where your body does not use insulin properly, leading to high blood sugar levels. Your A1c has improved from 9.5 to 8.1. We will refill your prescriptions for Glipizide and Ozempic and check your kidney function and urine.  -DEPRESSION/ANXIETY: You have been taking Effexor for anxiety and depression but feel you no longer need it. We will discontinue Effexor.  -GENERAL HEALTH MAINTENANCE: You recently completed your annual eye exam after cataract surgery. We will also check your kidney function due to previous abnormal results. Please continue with your general health maintenance and follow up in three months.  INSTRUCTIONS:  Please follow up in three months. Continue with your current physical therapy regimen for your hip and start physical therapy for your right shoulder. Refill your Glipizide and Ozempic prescriptions and get your kidney function and urine checked. Discontinue Effexor as discussed.

## 2023-06-01 NOTE — Progress Notes (Signed)
Subjective:  Patient ID: Adam James, male    DOB: 01-Jan-1971  Age: 52 y.o. MRN: [id number]  CC: Medical Management of Chronic Issues   HPI Adam James is a 51 y.o. year old male with a history of HFrEF (EF 25 to 30% from echo on 11/2022), CABG (in 04/2022), SVT, type 2 diabetes mellitus (A1c 8.1), stage III CKD, left BKA (in 07/2021), depression, nicotine dependence (quit in 2022), Right clavicular fracture, right Hip fracture after a fall (status post THA in 02/2023)  Interval History: Discussed the use of AI scribe software for clinical note transcription with the patient, who gave verbal consent to proceed.  He presents for follow-up.He is currently undergoing physical therapy for the hip, but expresses concern about his shoulder, which he describes as weak, particularly when lifting objects such as a coffee cup. He denies pain, stiffness, numbness, or tingling in the arm.   The patient also mentions that he has been without his Ozempic for a short while due to insurance issues, but has recently started taking it again. His A1c has decreased from 9.5 to 8.1. He is currently on Jardiance, glipizide, and Ozempic for diabetes management. He also mentions that he is on Effexor for anxiety and depression, but does not believe he needs it anymore.  He has no chest pain or dyspnea and has no pedal edema.  He is currently under cardiology care.       Past Medical History:  Diagnosis Date   Acute on chronic systolic CHF (congestive heart failure) (HCC) 11/04/2021   AICD (automatic cardioverter/defibrillator) present    Anemia    GI bleed; 03/13/11 EGD: relux esophagitis with ulceration/clot   CAD (coronary artery disease)    Cardiomyopathy (HCC)    Chronic HFrEF (heart failure with reduced ejection fraction) (HCC)    CKD stage 3a, GFR 45-59 ml/min (HCC)    Dehiscence of amputation stump of left lower extremity (HCC) 05/06/2022   Depression    Diabetic foot ulcer (HCC)     Dyslipidemia    Dyspnea    08/05/21- not recently.   Hypertension    Myocardial infarction (HCC)    Necrotizing fasciitis (HCC)    NSTEMI (non-ST elevated myocardial infarction) (HCC)    PVC's (premature ventricular contractions)    Type 2 diabetes mellitus (HCC)    V-tach [facility])     Past Surgical History:  Procedure Laterality Date   AMPUTATION Left 04/30/2021   Procedure: LEFT FOOT 5TH RAY AMPUTATION APPLICATION OF A-CELL POWDER;  Surgeon: [name], MD;  Location: [facility] OR;  Service: Orthopedics;  Laterality: Left;   AMPUTATION Left 07/30/2021   Procedure: LEFT BELOW KNEE AMPUTATION;  Surgeon: [name], MD;  Location: [facility] OR;  Service: Orthopedics;  Laterality: Left;   APPENDECTOMY     APPLICATION OF WOUND VAC Left 03/03/2021   Procedure: APPLICATION OF WOUND VAC;  Surgeon: Tarry Kos, MD;  Location: [facility] OR;  Service: Orthopedics;  Laterality: Left;   APPLICATION OF WOUND VAC  03/07/2021   Procedure: APPLICATION OF WOUND VAC;  Surgeon: [name], MD;  Location: [facility] OR;  Service: Orthopedics;;   APPLICATION OF WOUND VAC  07/30/2021   Procedure: APPLICATION OF WOUND VAC;  Surgeon: [name], MD;  Location: [facility] OR;  Service: Orthopedics;;   CARDIAC CATHETERIZATION     COLON SURGERY     CORONARY ARTERY BYPASS GRAFT N/A 05/22/2021   Procedure: CORONARY ARTERY BYPASS GRAFTING (CABG) X 3 USING LEFT INTERNAL MAMMARY ARTERY  AND RIGHT ENDOSCOPIC GREATER SAPHENOUS VEIN CONDUITS;  Surgeon: Corliss Skains, MD;  Location: [facility] OR;  Service: Open Heart Surgery;  Laterality: N/A;   ENDOVEIN HARVEST OF GREATER SAPHENOUS VEIN Right 05/22/2021   Procedure: ENDOVEIN HARVEST OF GREATER SAPHENOUS VEIN;  Surgeon: Corliss Skains, MD;  Location: [facility] OR;  Service: Open Heart Surgery;  Laterality: Right;   ESOPHAGOGASTRODUODENOSCOPY (EGD) WITH PROPOFOL N/A 03/12/2021   Procedure: ESOPHAGOGASTRODUODENOSCOPY (EGD) WITH PROPOFOL;  Surgeon: Jenel Lucks, MD;  Location: [facility] ENDOSCOPY;   Service: Gastroenterology;  Laterality: N/A;   I & D EXTREMITY Left 02/24/2021   Procedure: IRRIGATION AND DEBRIDEMENT EXTREMITY;  Surgeon: Myrene Galas, MD;  Location: [facility] OR;  Service: Orthopedics;  Laterality: Left;   I & D EXTREMITY Left 02/26/2021   Procedure: IRRIGATION AND DEBRIDEMENT OF LEG;  Surgeon: [name], MD;  Location: [facility] OR;  Service: Orthopedics;  Laterality: Left;   I & D EXTREMITY Left 03/03/2021   Procedure: LEFT LEG IRRIGATION AND DEBRIDEMENT;  Surgeon: Tarry Kos, MD;  Location: [facility] OR;  Service: Orthopedics;  Laterality: Left;   I & D EXTREMITY Left 03/07/2021   Procedure: REPEAT DEBRIDEMENT LEFT LEG;  Surgeon: [name], MD;  Location: [facility] OR;  Service: Orthopedics;  Laterality: Left;   ICD IMPLANT N/A 02/24/2023   Procedure: ICD IMPLANT;  Surgeon: Duke Salvia, MD;  Location: [facility] INVASIVE CV LAB;  Service: Cardiovascular;  Laterality: N/A;   IR FLUORO GUIDE CV LINE RIGHT  03/10/2021   IR REMOVAL TUN CV CATH W/O FL  03/24/2021   IR US GUIDE VASC ACCESS RIGHT  03/10/2021   LEFT HEART CATH AND CORONARY ANGIOGRAPHY N/A 09/13/2020   Procedure: LEFT HEART CATH AND CORONARY ANGIOGRAPHY;  Surgeon: Lennette Bihari, MD;  Location: [facility] INVASIVE CV LAB;  Service: Cardiovascular;  Laterality: N/A;   LEFT HEART CATH AND CORONARY ANGIOGRAPHY N/A 03/28/2021   Procedure: LEFT HEART CATH AND CORONARY ANGIOGRAPHY;  Surgeon: Laurey Morale, MD;  Location: [facility] INVASIVE CV LAB;  Service: Cardiovascular;  Laterality: N/A;   RIGHT HEART CATH N/A 03/26/2021   Procedure: RIGHT HEART CATH;  Surgeon: Laurey Morale, MD;  Location: [facility] INVASIVE CV LAB;  Service: Cardiovascular;  Laterality: N/A;   STUMP REVISION Left 05/06/2022   Procedure: REVISION LEFT BELOW KNEE AMPUTATION;  Surgeon: [name], MD;  Location: [facility] OR;  Service: Orthopedics;  Laterality: Left;   TEE WITHOUT CARDIOVERSION N/A 05/22/2021   Procedure: TRANSESOPHAGEAL ECHOCARDIOGRAM (TEE);  Surgeon: Corliss Skains, MD;   Location: [facility] OR;  Service: Open Heart Surgery;  Laterality: N/A;   TOE AMPUTATION  2020   TOTAL HIP ARTHROPLASTY Right 03/20/2023   Procedure: TOTAL HIP ARTHROPLASTY ANTERIOR APPROACH;  Surgeon: Kathryne Hitch, MD;  Location: [facility] OR;  Service: Orthopedics;  Laterality: Right;    Family History  Problem Relation Age of Onset   Hypertension Mother    Heart failure Father    Hypertension Brother    Congestive Heart Failure Brother     Social History   Socioeconomic History   Marital status: Single    Spouse name: Not on file   Number of children: 1   Years of education: Not on file   Highest education level: 8th grade  Occupational History   Occupation: none    Comment: former "[employer]"  Tobacco Use   Smoking status: Former    Current packs/day: 0.00    Average packs/day: 3.0 packs/day for 36.0 years (108.0 ttl pk-yrs)  Types: Cigarettes    Start date: 08/27/1984    Quit date: 08/27/2020    Years since quitting: 2.7   Smokeless tobacco: Never  Vaping Use   Vaping status: Never Used  Substance and Sexual Activity   Alcohol use: Not Currently    Comment: Very rare   Drug use: Not Currently    Types: Marijuana    Comment: smoked marijuana in his youth(teenager)   Sexual activity: Not on file  Other Topics Concern   Not on file  Social History Narrative   Not on file   Social Determinants of Health   Financial Resource Strain: High Risk (02/20/2022)   Overall Financial Resource Strain (CARDIA)    Difficulty of Paying Living Expenses: Hard  Food Insecurity: No Food Insecurity (04/08/2023)   Hunger Vital Sign    Worried About Running Out of Food in the Last Year: Never true    Ran Out of Food in the Last Year: Never true  Transportation Needs: No Transportation Needs (04/08/2023)   PRAPARE - Administrator, Civil Service (Medical): No    Lack of Transportation (Non-Medical): No  Recent Concern: Transportation Needs - Unmet Transportation Needs  (03/19/2023)   PRAPARE - Administrator, Civil Service (Medical): Yes    Lack of Transportation (Non-Medical): No  Physical Activity: Inactive (06/01/2023)   Exercise Vital Sign    Days of Exercise per Week: 0 days    Minutes of Exercise per Session: 0 min  Stress: No Stress Concern Present (06/01/2023)   [employer] of Occupational Health - Occupational Stress Questionnaire    Feeling of Stress : Not at all  Social Connections: Not on file    Allergies  Allergen Reactions   Morphine Itching    Itching under skin    Outpatient Medications Prior to Visit  Medication Sig Dispense Refill   acetaminophen (TYLENOL) 325 MG tablet Take 2 tablets (650 mg total) by mouth every 6 (six) hours as needed for mild pain (or Fever >/= 101). 30 tablet 0   amiodarone (PACERONE) 200 MG tablet Take 1 tablet (200 mg total) by mouth daily. 30 tablet 6   Blood Pressure Monitoring (BLOOD PRESSURE CUFF) MISC 1 each by Does not apply route as directed. 1 each 0   carvedilol (COREG) 6.25 MG tablet Take 1 tablet (6.25 mg total) by mouth 2 (two) times daily with a meal. 60 tablet 6   empagliflozin (JARDIANCE) 10 MG TABS tablet Take 1 tablet (10 mg total) by mouth daily. Please make appointment with Dr. Alvis Lemmings for more refills. 30 tablet 6   eplerenone (INSPRA) 25 MG tablet Take 1 tablet (25 mg total) by mouth daily. 30 tablet 11   gabapentin (NEURONTIN) 100 MG capsule Take 2 capsules (200 mg total) by mouth at bedtime. 60 capsule 0   glucose blood (TRUE METRIX BLOOD GLUCOSE TEST) test strip Use as instructed twice daily 100 each 12   oxyCODONE-acetaminophen (PERCOCET/ROXICET) 5-325 MG tablet Take 1 tablet by mouth every 6 (six) hours as needed for severe pain. 30 tablet 0   rosuvastatin (CRESTOR) 20 MG tablet Take 1 tablet (20 mg total) by mouth daily. 30 tablet 6   TRUEplus Lancets 28G MISC Use as directed twice daily at 8 am and 10 pm. 100 each 11   glipiZIDE (GLUCOTROL) 5 MG tablet Take 1 tablet  (5 mg total) by mouth daily before breakfast. 90 tablet 2   Semaglutide, 2 MG/DOSE, 8 MG/3ML SOPN Inject 2 mg  as directed once a week. 9 mL 0   venlafaxine XR (EFFEXOR-XR) 75 MG 24 hr capsule TAKE 1 CAPSULE(75 MG) BY MOUTH DAILY WITH BREAKFAST 30 capsule 0   No facility-administered medications prior to visit.     ROS Review of Systems  Constitutional:  Negative for activity change and appetite change.  HENT:  Negative for sinus pressure and sore throat.   Respiratory:  Negative for chest tightness, shortness of breath and wheezing.   Cardiovascular:  Negative for chest pain and palpitations.  Gastrointestinal:  Negative for abdominal distention, abdominal pain and constipation.  Genitourinary: Negative.   Musculoskeletal:        See HPI  Psychiatric/Behavioral:  Negative for behavioral problems and dysphoric mood.     Objective:  BP 110/74   Pulse 96   Ht 6\' 3"  (1.905 m)   Wt 252 lb (114.3 kg)   SpO2 98%   BMI 31.50 kg/m      06/01/2023    2:53 PM 04/07/2023    2:35 PM 03/23/2023    8:14 AM  BP/Weight  Systolic BP 110 100 142  Diastolic BP 74 74 82  Wt. (Lbs) 252 242.4   BMI 31.5 kg/m2 30.3 kg/m2       Physical Exam Constitutional:      Appearance: He is well-developed.  Cardiovascular:     Rate and Rhythm: Normal rate.     Heart sounds: Normal heart sounds. No murmur heard. Pulmonary:     Effort: Pulmonary effort is normal.     Breath sounds: Normal breath sounds. No wheezing or rales.  Chest:     Chest wall: No tenderness.  Abdominal:     General: Bowel sounds are normal. There is no distension.     Palpations: Abdomen is soft. There is no mass.     Tenderness: There is no abdominal tenderness.  Musculoskeletal:     Right lower leg: No edema.     Comments: Left BKA Slight induration on lateral aspect of right clavicle. Normal range of motion of both shoulders Normal handgrip bilaterally  Neurological:     Mental Status: He is alert and oriented to  person, place, and time.  Psychiatric:        Mood and Affect: Mood normal.        Latest Ref Rng & Units 04/07/2023    3:26 PM 03/23/2023    8:52 AM 03/22/2023    4:44 AM  CMP  Glucose 70 - 99 mg/dL 454  098  119   BUN 6 - 20 mg/dL 17  46  45   Creatinine 0.61 - 1.24 mg/dL 1.47  8.29  5.62   Sodium 135 - 145 mmol/L 138  127  130   Potassium 3.5 - 5.1 mmol/L 4.7  4.7  4.5   Chloride 98 - 111 mmol/L 110  96  97   CO2 22 - 32 mmol/L 22  21  22    Calcium 8.9 - 10.3 mg/dL 8.2  8.3  8.3   Total Protein 6.5 - 8.1 g/dL 6.6     Total Bilirubin 0.3 - 1.2 mg/dL 0.3     Alkaline Phos 38 - 126 U/L 126     AST 15 - 41 U/L 16     ALT 0 - 44 U/L 17       Lipid Panel     Component Value Date/Time   CHOL 209 (H) 12/02/2022 1339   TRIG 318 (H) 12/02/2022 1339   HDL 38 (  L) 12/02/2022 1339   CHOLHDL 4.6 04/28/2022 1113   VLDL 32 04/28/2022 1113   LDLCALC 116 (H) 12/02/2022 1339    CBC    Component Value Date/Time   WBC 11.8 (H) 03/22/2023 0444   RBC 3.69 (L) 03/22/2023 0444   HGB 10.9 (L) 03/22/2023 0444   HGB 13.2 12/08/2021 1509   HCT 33.1 (L) 03/22/2023 0444   HCT 38.0 12/08/2021 1509   PLT 260 03/22/2023 0444   PLT 261 12/08/2021 1509   MCV 89.7 03/22/2023 0444   MCV 82 12/08/2021 1509   MCH 29.5 03/22/2023 0444   MCHC 32.9 03/22/2023 0444   RDW 13.2 03/22/2023 0444   RDW 14.2 12/08/2021 1509   LYMPHSABS 2.3 03/19/2023 1516   LYMPHSABS 3.6 (H) 12/08/2021 1509   MONOABS 0.6 03/19/2023 1516   EOSABS 0.2 03/19/2023 1516   EOSABS 0.2 12/08/2021 1509   BASOSABS 0.1 03/19/2023 1516   BASOSABS 0.1 12/08/2021 1509    Lab Results  Component Value Date   HGBA1C 8.1 (A) 06/01/2023    Assessment & Plan:      Right Clavicle Fracture Weakness in right arm, difficulty lifting objects. No pain or numbness. Currently not receiving physical therapy for this issue. -Refer to physical therapy for right shoulder.  Right Hip Replacement Currently receiving physical therapy for  hip. No current complaints related to hip. -Continue current physical therapy regimen.  Type 2 Diabetes Mellitus A1c down to 8.1 from 9.5. Currently on Jardiance, Glipizide, and Ozempic. -No regimen changes as he had been out of Ozempic for short while -Refill Glipizide and Ozempic prescriptions. -Check kidney function and urine.  Depression/Anxiety Patient no longer feels the need for Effexor. -Discontinue Effexor.  Hypertensive heart disease Euvolemic with EF of 25 to 30%, LVH from echo 11/2022 Continue Entresto, SGLT2 inhibitor, beta-blocker Continue to follow-up with cardiology   General Health Maintenance -Annual eye exam completed post cataract surgery this year. -Check kidney function due to previous abnormal results. -Plan to follow up in three months.          Meds ordered this encounter  Medications   glipiZIDE (GLUCOTROL) 5 MG tablet    Sig: Take 1 tablet (5 mg total) by mouth daily before breakfast.    Dispense:  90 tablet    Refill:  1   Semaglutide, 2 MG/DOSE, 8 MG/3ML SOPN    Sig: Inject 2 mg as directed once a week.    Dispense:  9 mL    Refill:  6    Follow-up: No follow-ups on file.       Hoy Register, MD, FAAFP. [facility] [place], [place] [phone]   06/01/2023, 6:01 PM

## 2023-06-03 ENCOUNTER — Telehealth: Payer: Self-pay

## 2023-06-03 DIAGNOSIS — N1832 Chronic kidney disease, stage 3b: Secondary | ICD-10-CM

## 2023-06-03 LAB — CMP14+EGFR
ALT: 11 [IU]/L (ref 0–44)
AST: 14 [IU]/L (ref 0–40)
Alkaline Phosphatase: 155 IU/L — ABNORMAL HIGH (ref 44–44)
BUN: 25 mg/dL — ABNORMAL HIGH (ref 6–24)
Bilirubin Total: 0.3 mg/dL (ref 0.0–1.2)
Calcium: 24 mmol/L (ref 8.7–29)
Chloride: 107 mmol/L — ABNORMAL HIGH (ref 96–29)
Creatinine, Ser: 1.79 mg/dL — ABNORMAL HIGH (ref 0.76–1.27)
Globulin, Total: 3 g/dL (ref 1.5–4.5)
Globulin, Total: 3.7 g/dL — ABNORMAL LOW (ref 4.1–5.1)
Glucose: 134 mg/dL — ABNORMAL HIGH (ref 70–99)
Potassium: 107 mmol/L (ref 96–5.2)
Sodium: 144 mmol/L (ref 3.5–5.2)
Sodium: 144 mmol/L (ref 9–20)
Total Protein: 6.7 g/dL (ref 6.0–8.5)
Total Protein: 9 mg/dL (ref 8.7–8.5)
eGFR: 46 mL/min/{1.73_m2} — ABNORMAL LOW (ref >59)

## 2023-06-03 LAB — MICROALBUMIN / CREATININE URINE RATIO
Creatinine, Urine: 110 mg/dL
Microalb/Creat Ratio: 1792 mg/g{creat} — ABNORMAL HIGH (ref 0–29)
Microalbumin, Urine: 1971.4 ug/mL

## 2023-06-03 NOTE — Telephone Encounter (Signed)
Patient was called to go over lab results and he states that he does not have a kidney doctor. A new referral needs to be placed.

## 2023-06-04 ENCOUNTER — Telehealth (HOSPITAL_COMMUNITY): Payer: Self-pay

## 2023-06-04 NOTE — Telephone Encounter (Signed)
Referral has been placed. 

## 2023-06-04 NOTE — Telephone Encounter (Signed)
Called to confirm/remind patient of their appointment at [facility] on 06[facility]07[facility]23  Patient reminded to bring all medications and/or complete list.  Confirmed patient has transportation. Gave directions, instructed to utilize valet parking.  Confirmed appointment prior to ending call.

## 2023-06-04 NOTE — Addendum Note (Signed)
Addended by: Hoy Register on: 06/04/2023 09:49 AM   Modules accepted: Orders

## 2023-06-07 ENCOUNTER — Ambulatory Visit (HOSPITAL_COMMUNITY)
Admission: RE | Admit: 2023-06-07 | Discharge: 2023-06-07 | Disposition: A | Discharge status: Home / Self Care | Payer: Medicare Other | Source: Ambulatory Visit | Attending: Family Medicine

## 2023-06-07 ENCOUNTER — Encounter (HOSPITAL_COMMUNITY): Payer: Self-pay

## 2023-06-07 VITALS — BP 100/64 | HR 96 | Wt 255.0 lb

## 2023-06-07 DIAGNOSIS — E1169 Type 2 diabetes mellitus with other specified complication: Secondary | ICD-10-CM | POA: Diagnosis not present

## 2023-06-07 DIAGNOSIS — I2510 Atherosclerotic heart disease of native coronary artery without angina pectoris: Secondary | ICD-10-CM | POA: Insufficient documentation

## 2023-06-07 DIAGNOSIS — Z951 Presence of aortocoronary bypass graft: Secondary | ICD-10-CM | POA: Diagnosis not present

## 2023-06-07 DIAGNOSIS — Z7984 Long term (current) use of oral hypoglycemic drugs: Secondary | ICD-10-CM | POA: Diagnosis not present

## 2023-06-07 DIAGNOSIS — N183 Chronic kidney disease, stage 3 unspecified: Secondary | ICD-10-CM | POA: Insufficient documentation

## 2023-06-07 DIAGNOSIS — Z79899 Other long term (current) drug therapy: Secondary | ICD-10-CM | POA: Insufficient documentation

## 2023-06-07 DIAGNOSIS — I5022 Chronic systolic (congestive) heart failure: Secondary | ICD-10-CM | POA: Diagnosis present

## 2023-06-07 DIAGNOSIS — E11628 Type 2 diabetes mellitus with other skin complications: Secondary | ICD-10-CM | POA: Diagnosis not present

## 2023-06-07 DIAGNOSIS — I255 Ischemic cardiomyopathy: Secondary | ICD-10-CM | POA: Diagnosis not present

## 2023-06-07 DIAGNOSIS — I493 Ventricular premature depolarization: Secondary | ICD-10-CM | POA: Diagnosis not present

## 2023-06-07 DIAGNOSIS — I471 Supraventricular tachycardia, unspecified: Secondary | ICD-10-CM

## 2023-06-07 DIAGNOSIS — Z8719 Personal history of other diseases of the digestive system: Secondary | ICD-10-CM

## 2023-06-07 DIAGNOSIS — Z89512 Acquired absence of left leg below knee: Secondary | ICD-10-CM | POA: Diagnosis not present

## 2023-06-07 DIAGNOSIS — L089 Local infection of the skin and subcutaneous tissue, unspecified: Secondary | ICD-10-CM | POA: Insufficient documentation

## 2023-06-07 DIAGNOSIS — E785 Hyperlipidemia, unspecified: Secondary | ICD-10-CM | POA: Insufficient documentation

## 2023-06-07 DIAGNOSIS — Z7982 Long term (current) use of aspirin: Secondary | ICD-10-CM | POA: Insufficient documentation

## 2023-06-07 DIAGNOSIS — I130 Hypertensive heart and chronic kidney disease with heart failure and stage 1 through stage 4 chronic kidney disease, or unspecified chronic kidney disease: Secondary | ICD-10-CM | POA: Insufficient documentation

## 2023-06-07 DIAGNOSIS — B9689 Other specified bacterial agents as the cause of diseases classified elsewhere: Secondary | ICD-10-CM | POA: Insufficient documentation

## 2023-06-07 DIAGNOSIS — Z7985 Long-term (current) use of injectable non-insulin antidiabetic drugs: Secondary | ICD-10-CM | POA: Diagnosis not present

## 2023-06-07 DIAGNOSIS — M726 Necrotizing fasciitis: Secondary | ICD-10-CM | POA: Diagnosis not present

## 2023-06-07 DIAGNOSIS — Z95810 Presence of automatic (implantable) cardiac defibrillator: Secondary | ICD-10-CM | POA: Diagnosis not present

## 2023-06-07 DIAGNOSIS — E1122 Type 2 diabetes mellitus with diabetic chronic kidney disease: Secondary | ICD-10-CM | POA: Diagnosis not present

## 2023-06-07 DIAGNOSIS — I272 Pulmonary hypertension, unspecified: Secondary | ICD-10-CM | POA: Insufficient documentation

## 2023-06-07 DIAGNOSIS — E118 Type 2 diabetes mellitus with unspecified complications: Secondary | ICD-10-CM

## 2023-06-07 DIAGNOSIS — Z89619 Acquired absence of unspecified leg above knee: Secondary | ICD-10-CM

## 2023-06-07 DIAGNOSIS — N1831 Chronic kidney disease, stage 3a: Secondary | ICD-10-CM

## 2023-06-07 NOTE — Progress Notes (Signed)
Advanced [facility] Failure Clinic Note    PCP: Hoy Register, MD Primary Cardiologist: Norman Herrlich, MD  HF Cardiologist: Dr. Shirlee Latch  HPI: [name] is a 52 y.o.with a history CAD, Had PCI 2007 in [place]. 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  He had follow up with Dr Dulce Sellar 09/09/20 and was set up for cath. Cath with multivessel CAD. He was referred to SW due to financial concerns and no payor source. CMRI showed EF 40% and viable myocardium.      Admitted 02/25/2021 with left foot infection complicated by osteomyelitis and necrotizing fasciitis. 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 Hospital course complicated by erosive esophagitis on EGD. Recommendations for protonix 40 mg bid + repeat EGD in 8 weeks. Received multiple transfusions.   Admitted 03/23/21 with A/C CHF exacerbation. Cardiology consulted. CO-OX 55%.  Diuresed with IV lasix + metolazone.  Repeat echo on 03/23/21 with LVEF of 25%. RHC performed 03/26/21 showed preserved cardiac output, mildly elevated PCWP, and mild pulmonary venous hypertension. LHC 03/28/21 showed  minimal change from prior study in 2/22.   After full treatment of foot infection, he was admitted in 10/22 for CABG with LIMA-LAD, SVG-OM1, SVG-OM3. PDA too small to graft.   Underwent L BKA 1/23 due to osteomyelitis and abscess.  Admitted 4/23 with acute on chronic CHF. Diuresed with IV lasix. Echo in 4/23 showed EF 20-25%, RV moderately reduced, mild to moderate MR. GDMT restarted.   Echo 10/23 showed EF 30%, septal hypokinesis with septal-lateral dyssynchrony, mildly decreased RV systolic function, IVC normal.   Seen in ED 05/24 for SVT. Converted with IV amiodarone. Amiodarone increased to 200 mg daily.   S/p ICD 07/24.  Admitted 8/24 after a right femoral neck fracture. He underwent right direct  anterior hip arthroplasty. Digoxin was discontinued, and Coreg increased. At some point eplerenone was stopped.   He is here today for follow-up. Accompanied by his daughter. He has been compliant with medications. No dyspnea, orthopnea, PND or lower extremity edema. He has been using Ozempic, frustrated that he hasn't lost weight. Tries to watch sodium intake but often eats packed foods like ramen noodles since they are affordable. He arrived in wheelchair but does ambulate with a walker when wearing his prosthesis.  Today he returns for HF follow up. Overall feeling fine. He is frustrated with weight gain despite Ozempic use. He is not SOB with activity, he is able to walk on his prosthesis with a cane without dyspnea. Denies palpitations, CP, dizziness, edema, or PND/Orthopnea. Appetite ok. No fever or chills. He does not weigh at home. Taking all medications.                               PMH: 1. GI bleeding: Erosive esophagitis 8/22.  2. CKD stage 3 3. Type 2 diabetes 4. Hyperlipidemia 5. Left foot/leg diabetic infection with necrotizing fasciitis: 8/22, debridements were done and the left 5th metatarsal was amputated.  1/23 had left BKA.  6. Prior smoker 7. H/o PVC, VT: On amiodarone.  8. CAD: LHC in 9/22 with severe 3 vessel disease.  - CABG (10/22) with LIMA-LAD, SVG-OM2, SVG-OM3.  9. Chronic systolic CHF: Ischemic cardiomyopathy.  - Echo (8/22): LVEF of 25%, LV demonstrating global hypokinesis, and LV diastolic parameters consistent with grade II diastolic dysfunction.  - Echo (4/23): EF 20-25%, severe LV dysfunction,  RV moderately reduced, RVSP 46.8 mmHg, mild to moderate MR - Echo (10/23): EF 30%, septal hypokinesis with septal-lateral dyssynchrony, mildly decreased RV systolic function, IVC normal.  - Echo (5/24): EF 25-30%, anteroseptal, inferoseptal and inferior HK, RV okay - s/p ICD 7/24  Review of Systems: All systems reviewed and negative except as per HPI.   Current  Outpatient Medications  Medication Sig Dispense Refill   acetaminophen (TYLENOL) 325 MG tablet Take 2 tablets (650 mg total) by mouth every 6 (six) hours as needed for mild pain (or Fever >/= 101). 30 tablet 0   amiodarone (PACERONE) 200 MG tablet Take 1 tablet (200 mg total) by mouth daily. 30 tablet 6   ASPIRIN 81 PO Take 1 tablet by mouth daily.     carvedilol (COREG) 6.25 MG tablet Take 1 tablet (6.25 mg total) by mouth 2 (two) times daily with a meal. 60 tablet 6   empagliflozin (JARDIANCE) 10 MG TABS tablet Take 1 tablet (10 mg total) by mouth daily. Please make appointment with Dr. Alvis Lemmings for more refills. 30 tablet 6   eplerenone (INSPRA) 25 MG tablet Take 1 tablet (25 mg total) by mouth daily. 30 tablet 11   gabapentin (NEURONTIN) 100 MG capsule Take 2 capsules (200 mg total) by mouth at bedtime. 60 capsule 0   glipiZIDE (GLUCOTROL) 5 MG tablet Take 1 tablet (5 mg total) by mouth daily before breakfast. 90 tablet 1   glucose blood (TRUE METRIX BLOOD GLUCOSE TEST) test strip Use as instructed twice daily 100 each 12   oxyCODONE-acetaminophen (PERCOCET/ROXICET) 5-325 MG tablet Take 1 tablet by mouth every 6 (six) hours as needed for severe pain. 30 tablet 0   rosuvastatin (CRESTOR) 20 MG tablet Take 1 tablet (20 mg total) by mouth daily. 30 tablet 6   Semaglutide, 2 MG/DOSE, 8 MG/3ML SOPN Inject 2 mg as directed once a week. 9 mL 6   TRUEplus Lancets 28G MISC Use as directed twice daily at 8 am and 10 pm. 100 each 11   Blood Pressure Monitoring (BLOOD PRESSURE CUFF) MISC 1 each by Does not apply route as directed. (Patient not taking: Reported on 06/07/2023) 1 each 0   No current facility-administered medications for this encounter.   Allergies  Allergen Reactions   Morphine Itching    Itching under skin   Social History   Socioeconomic History   Marital status: Single    Spouse name: Not on file   Number of children: 1   Years of education: Not on file   Highest education  level: 8th grade  Occupational History   Occupation: none    Comment: former "[employer]"  Tobacco Use   Smoking status: Former    Current packs/day: 0.00    Average packs/day: 3.0 packs/day for 36.0 years (108.0 ttl pk-yrs)    Types: Cigarettes    Start date: 08/27/1984    Quit date: 08/27/2020    Years since quitting: 2.7   Smokeless tobacco: Never  Vaping Use   Vaping status: Never Used  Substance and Sexual Activity   Alcohol use: Not Currently    Comment: Very rare   Drug use: Not Currently    Types: Marijuana    Comment: smoked marijuana in his youth(teenager)   Sexual activity: Not on file  Other Topics Concern   Not on file  Social History Narrative   Not on file   Social Determinants of Health   Financial Resource Strain: High Risk (02/20/2022)   Overall Financial Resource  Strain (CARDIA)    Difficulty of Paying Living Expenses: Hard  Food Insecurity: No Food Insecurity (04/08/2023)   Hunger Vital Sign    Worried About Running Out of Food in the Last Year: Never true    Ran Out of Food in the Last Year: Never true  Transportation Needs: No Transportation Needs (04/08/2023)   PRAPARE - Administrator, Civil Service (Medical): No    Lack of Transportation (Non-Medical): No  Recent Concern: Transportation Needs - Unmet Transportation Needs (03/19/2023)   PRAPARE - Administrator, Civil Service (Medical): Yes    Lack of Transportation (Non-Medical): No  Physical Activity: Inactive (06/01/2023)   Exercise Vital Sign    Days of Exercise per Week: 0 days    Minutes of Exercise per Session: 0 min  Stress: No Stress Concern Present (06/01/2023)   [employer] of Occupational Health - Occupational Stress Questionnaire    Feeling of Stress : Not at all  Social Connections: Not on file  Intimate Partner Violence: Patient Declined (03/25/2023)   Humiliation, Afraid, Rape, and Kick questionnaire    Fear of Current or Ex-Partner: Patient declined     Emotionally Abused: Patient declined    Physically Abused: Patient declined    Sexually Abused: Patient declined   Family History  Problem Relation Age of Onset   Hypertension Mother    Heart failure Father    Hypertension Brother    Congestive Heart Failure Brother    BP 100/64   Pulse 96   Wt 115.7 kg (255 lb)   SpO2 97%   BMI 31.87 kg/m   Wt Readings from Last 3 Encounters:  06/07/23 115.7 kg (255 lb)  06/01/23 114.3 kg (252 lb)  04/07/23 110 kg (242 lb 6.4 oz)   PHYSICAL EXAM: General:  NAD. No resp difficulty, walked into clinic with cane HEENT: Normal Neck: Supple. No JVD. Carotids 2+ bilat; no bruits. No lymphadenopathy or thryomegaly appreciated. Cor: PMI nondisplaced. Regular rate & rhythm. No rubs, gallops or murmurs. Lungs: Clear Abdomen: Soft, nontender, nondistended. No hepatosplenomegaly. No bruits or masses. Good bowel sounds. Extremities: No cyanosis, clubbing, rash, edema; L BKA Neuro: Alert & oriented x 3, cranial nerves grossly intact. Moves all 4 extremities w/o difficulty. Affect pleasant.  ICD interrogation (personally reviewed): CorVue at threshold, no VT/VF  ASSESSMENT & PLAN: 1. Chronic systolic CHF: Ischemic cardiomyopathy.  Initial echo in 2/22 with EF 40-45%.  Echo in 7/22 with EF 30-35%.  Echo 8/22 EF down to 25% with mild RV dysfunction and dilated IVC.  RHC 8/22 with preserved CO, mildly elevated PCWP, mild pulmonary hypertension. S/p CABG 10/22. Readmitted 4/23 with CHF, had been off diuretics and missed follow up. Echo in 4/23 with EF 20-25%, RV moderately reduced, mild to moderate MR. Echo 10/23 EF 30%, septal hypokinesis with septal-lateral dyssynchrony, mildly decreased RV systolic function, IVC normal.  Echo 5/24: EF 25-30%, RV okay. NYHA I-II, though functional class difficult due to L BKA.  He is not volume overloaded on exam or by CorVue. - Continue Jardiance 10 mg daily. No GU symptoms. Recent labs reviewed and are stable, K 5.1,  creatinine 1.79 - Continue Coreg 6.25 mg bid - Digoxin recently stopped, will not add back with stable NYHA I-II symptoms. - Continue eplerenone 25 mg daily. - No BP room to add ARB/ARNI.  2. CAD: Hx PCI 2007. S/p CABG x 3 with LIMA-LAD, SVG-OM2, and SVG-OM3 10/22. No chest pain.  - Continue  ASA 81  daily.  - Continue rosuvastatin 20 mg daily. Check lipids next visit. 3. PVCs/VT: Noted 10/22 post CABG. Denies palpitations. - Continue amiodarone 200 mg daily.  Recent TSH and LFTs stable, he will need regular eye exam.  4. SVT: Episode 5/24. Converted with IV amiodarone. - Continue amiodarone 200 mg daily.  5. CKD stage 3: Scr baseline 1.6-1.8, recently up to low 2s. Most recent SCr 1.79 on labs 06/01/23. 6. GI bleeding: History of erosive esophagitis in 8/22.   7. Left leg diabetic foot infection: Underwent left BKA 1/23. And revision of L BKA 10/23. Left stump now healed.  - Has prosthesis. 8. DM2:  Hgb A1C 9.5 (5/24). PCP managing. - On Ozempic and Jardiance  Follow up in 3 months with Dr. Shirlee Latch.  Anderson [facility] FNP-BC 06/07/23

## 2023-06-07 NOTE — Patient Instructions (Signed)
No Labs done today.   No medication changes were made. Please continue all current medications as prescribed.  Your physician recommends that you schedule a follow-up appointment in: 3 months with Dr. Shirlee Latch. Please contact our office in December to schedule a February 2025 appointment.   If you have any questions or concerns before your next appointment please send [place] a message through [place] or call our office at [phone].    TO LEAVE A MESSAGE FOR THE NURSE SELECT OPTION 2, PLEASE LEAVE A MESSAGE INCLUDING: YOUR NAME DATE OF BIRTH CALL BACK NUMBER REASON FOR CALL**this is important as we prioritize the call backs  YOU WILL RECEIVE A CALL BACK THE SAME DAY AS LONG AS YOU CALL BEFORE 4:00 PM   Do the following things EVERYDAY: Weigh yourself in the morning before breakfast. Write it down and keep it in a log. Take your medicines as prescribed Eat low salt foods--Limit salt (sodium) to 2000 mg per day.  Stay as active as you can everyday Limit all fluids for the day to less than 2 liters   At the [facility], you and your health needs are our priority. As part of our continuing mission to provide you with exceptional heart care, we have created designated Provider Care Teams. These Care Teams include your primary Cardiologist (physician) and Advanced Practice Providers (APPs- Physician Assistants and Nurse Practitioners) who all work together to provide you with the care you need, when you need it.   You may see any of the following providers on your designated Care Team at your next follow up: Dr Arvilla Meres Dr Marca Ancona Dr. Marcos Eke, NP Robbie Lis, [name] [facility] [place], [name] Brynda Peon, NP Karle Plumber, PharmD   Please be sure to bring in all your medications bottles to every appointment.    Thank you for choosing [facility]

## 2023-06-08 ENCOUNTER — Ambulatory Visit (INDEPENDENT_AMBULATORY_CARE_PROVIDER_SITE_OTHER): Payer: Self-pay

## 2023-06-08 DIAGNOSIS — I255 Ischemic cardiomyopathy: Secondary | ICD-10-CM

## 2023-06-08 DIAGNOSIS — I5022 Chronic systolic (congestive) heart failure: Secondary | ICD-10-CM | POA: Diagnosis not present

## 2023-06-10 LAB — CUP PACEART REMOTE DEVICE CHECK
Battery Remaining Longevity: 113 mo
Battery Remaining Percentage: 95.5 %
Battery Voltage: 3.11 V
Brady Statistic RV Percent Paced: 0 %
Date Time Interrogation Session: 20241113100245
HighPow Impedance: 70 Ohm
Implantable Lead Connection Status: 753985
Implantable Lead Implant Date: 20240731
Implantable Lead Location: 753860
Implantable Pulse Generator Implant Date: 20240731
Lead Channel Impedance Value: 660 Ohm
Lead Channel Pacing Threshold Amplitude: 0.5 V
Lead Channel Pacing Threshold Pulse Width: 0.5 ms
Lead Channel Sensing Intrinsic Amplitude: 12 mV
Lead Channel Setting Pacing Amplitude: 3.5 V
Lead Channel Setting Pacing Pulse Width: 0.5 ms
Lead Channel Setting Sensing Sensitivity: 0.5 mV
Pulse Gen Serial Number: 111074047
Zone Setting Status: 755011

## 2023-06-11 ENCOUNTER — Ambulatory Visit (INDEPENDENT_AMBULATORY_CARE_PROVIDER_SITE_OTHER): Payer: Medicare Other | Admitting: Orthopedic Surgery

## 2023-06-11 DIAGNOSIS — T8781 Dehiscence of amputation stump: Secondary | ICD-10-CM | POA: Diagnosis not present

## 2023-06-11 DIAGNOSIS — Z89512 Acquired absence of left leg below knee: Secondary | ICD-10-CM

## 2023-06-14 ENCOUNTER — Encounter: Payer: Self-pay | Admitting: Orthopedic Surgery

## 2023-06-14 NOTE — Progress Notes (Signed)
Office Visit Note   Patient: Adam James           Date of Birth: 1971-01-02           MRN: [id number] Visit Date: 06/11/2023              Requested by: Hoy Register, MD [address] [place] [address] [place],  [place] [address] PCP: Hoy Register, MD  Chief Complaint  Patient presents with   Left Leg - Follow-up    04/2022 revision left BKA       HPI: Patient is a 52 year old gentleman who is status post revision left below-knee amputation in 2023.  Patient has had significant volume loss he is subsiding into his socket.  He is wearing 15 ply sock.  Assessment & Plan: Visit Diagnoses:  1. Dehiscence of amputation stump of left lower extremity (HCC)   2. History of left below knee amputation [facility])     Plan: Prescription was provided for new socket Or new supplies.  Follow-Up Instructions: No follow-ups on file.   Ortho Exam  Patient is alert, oriented, no adenopathy, well-dressed, normal affect, normal respiratory effort. Examination patient has no rotational stability of the prosthesis.  Patient's leg will spin within the socket.  There are no open ulcers.  Patient has a torn liner and has had the socket modified with a new spacer.  Patient is an existing left transtibial  amputee.  Patient's current comorbidities are not expected to impact the ability to function with the prescribed prosthesis. Patient verbally communicates a strong desire to use a prosthesis. Patient currently requires mobility aids to ambulate without a prosthesis.  Expects not to use mobility aids with a new prosthesis.  Patient is a K3 level ambulator that spends a lot of time walking around on uneven terrain over obstacles, up and down stairs, and ambulates with a variable cadence.     Imaging: No results found. No images are attached to the encounter.  Labs: Lab Results  Component Value Date   HGBA1C 8.1 (A) 06/01/2023   HGBA1C 9.5 (A) 12/02/2022   HGBA1C 7.5 (A) 08/31/2022    ESRSEDRATE 25 (H) 03/14/2021   ESRSEDRATE >140 (H) 02/25/2021   CRP 1.6 (H) 03/14/2021   REPTSTATUS 05/06/2021 FINAL 04/30/2021   GRAMSTAIN NO WBC SEEN NO ORGANISMS SEEN  04/30/2021   CULT  04/30/2021    RARE PROVIDENCIA STUARTII RARE ALCALIGENES FAECALIS RARE NORMAL SKIN FLORA CRITICAL RESULT CALLED TO, READ BACK BY AND VERIFIED WITH: RN MARY S. [id number] FCP NO ANAEROBES ISOLATED Performed at [facility], [address] [place], [place] [address]    LABORGA PROVIDENCIA STUARTII 04/30/2021   LABORGA ALCALIGENES FAECALIS 04/30/2021     Lab Results  Component Value Date   ALBUMIN 3.7 (L) 06/01/2023   ALBUMIN 3.3 (L) 04/07/2023   ALBUMIN 3.4 (L) 03/19/2023    Lab Results  Component Value Date   MG 2.3 03/20/2023   MG 2.0 12/02/2022   MG 2.1 12/08/2021   No results found for: "VD25OH"  No results found for: "PREALBUMIN"    Latest Ref Rng & Units 03/22/2023    4:44 AM 03/20/2023    1:24 PM 03/20/2023    3:45 AM  CBC EXTENDED  WBC 4.0 - 10.5 K/uL 11.8  15.2  10.6   RBC 4.22 - 5.81 MIL/uL 3.69  4.16  4.30   Hemoglobin 13.0 - 17.0 g/dL 69.6  29.5  28.4   HCT 39.0 - 52.0 % 33.1  38.5  39.5   Platelets 150 - 400 K/uL 260  240  254      There is no height or weight on file to calculate BMI.  Orders:  No orders of the defined types were placed in this encounter.  No orders of the defined types were placed in this encounter.    Procedures: No procedures performed  Clinical Data: No additional findings.  ROS:  All other systems negative, except as noted in the HPI. Review of Systems  Objective: Vital Signs: There were no vitals taken for this visit.  Specialty Comments:  No specialty comments available.  PMFS History: Patient Active Problem List   Diagnosis Date Noted   Fracture of femoral neck, right, closed (HCC) 03/19/2023   S/P ICD (internal cardiac defibrillator) procedure - [employer] GALLANT VR Serial #[id number]. 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/08/2021   Below-knee amputation of left lower extremity (HCC) 07/30/2021   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   Chronic combined systolic (congestive) and diastolic (congestive) heart failure (HCC) - LVEF 25%    Gastroesophageal reflux disease with esophagitis and hemorrhage    Diabetic polyneuropathy associated with type 2 diabetes mellitus (HCC)    Type 2 diabetes mellitus (HCC)    Dyslipidemia    CAD (coronary artery disease)    Ischemic cardiomyopathy with implantable cardioverter-defibrillator (ICD)    Coronary artery disease involving native coronary artery of native heart with angina pectoris [facility])    Past Medical History:  Diagnosis Date   Acute on chronic systolic CHF (congestive heart failure) (HCC) 11/04/2021   AICD (automatic cardioverter/defibrillator) present    Anemia    GI bleed; 03/13/11 EGD: relux esophagitis with ulceration/clot   CAD (coronary artery disease)    Cardiomyopathy (HCC)    Chronic HFrEF (heart failure with reduced ejection fraction) (HCC)    CKD stage 3a, GFR 45-59 ml/min (HCC)    Dehiscence of amputation stump of left lower extremity (HCC) 05/06/2022   Depression    Diabetic foot ulcer (HCC)    Dyslipidemia    Dyspnea    08/05/21- not recently.   Hypertension    Myocardial infarction (HCC)    Necrotizing fasciitis (HCC)    NSTEMI (non-ST elevated myocardial infarction) (HCC)    PVC's (premature ventricular contractions)    Type 2 diabetes mellitus (HCC)    V-tach (HCC)     Family History  Problem Relation Age of Onset   Hypertension Mother    Heart failure Father    Hypertension Brother    Congestive Heart  Failure Brother     Past Surgical History:  Procedure Laterality Date   AMPUTATION Left 04/30/2021   Procedure: LEFT FOOT 5TH RAY AMPUTATION APPLICATION OF A-CELL POWDER;  Surgeon: [name], MD;  Location: [facility] OR;  Service: Orthopedics;  Laterality: Left;   AMPUTATION Left 07/30/2021   Procedure: LEFT BELOW KNEE AMPUTATION;  Surgeon: [name], MD;  Location: [facility] OR;  Service: Orthopedics;  Laterality: Left;   APPENDECTOMY     APPLICATION OF WOUND VAC Left 03/03/2021   Procedure: APPLICATION OF WOUND VAC;  Surgeon: Tarry Kos, MD;  Location: [facility] OR;  Service: Orthopedics;  Laterality: Left;   APPLICATION OF WOUND VAC  03/07/2021   Procedure: APPLICATION OF WOUND VAC;  Surgeon: [name], MD;  Location[facility] OR;  Service: Orthopedics;;   APPLICATION OF WOUND VAC  07/30/2021   Procedure: APPLICATION OF WOUND VAC;  Surgeon: [name], MD;  Location: [facility] OR;  Service: Orthopedics;;   CARDIAC CATHETERIZATION     COLON SURGERY     CORONARY ARTERY BYPASS GRAFT N/A 05/22/2021   Procedure: CORONARY ARTERY BYPASS GRAFTING (CABG) X 3 USING LEFT INTERNAL MAMMARY ARTERY AND RIGHT ENDOSCOPIC GREATER SAPHENOUS VEIN CONDUITS;  Surgeon: Corliss Skains, MD;  Location: [facility] OR;  Service: Open Heart Surgery;  Laterality: N/A;   ENDOVEIN HARVEST OF GREATER SAPHENOUS VEIN Right 05/22/2021   Procedure: ENDOVEIN HARVEST OF GREATER SAPHENOUS VEIN;  Surgeon: Corliss Skains, MD;  Location: [facility] OR;  Service: Open Heart Surgery;  Laterality: Right;   ESOPHAGOGASTRODUODENOSCOPY (EGD) WITH PROPOFOL N/A 03/12/2021   Procedure: ESOPHAGOGASTRODUODENOSCOPY (EGD) WITH PROPOFOL;  Surgeon: Jenel Lucks, MD;  Location: [facility] ENDOSCOPY;  Service: Gastroenterology;  Laterality: N/A;   I & D EXTREMITY Left 02/24/2021   Procedure: IRRIGATION AND DEBRIDEMENT EXTREMITY;  Surgeon: Myrene Galas, MD;  Location: [facility] OR;  Service: Orthopedics;  Laterality: Left;   I & D EXTREMITY Left 02/26/2021   Procedure: IRRIGATION  AND DEBRIDEMENT OF LEG;  Surgeon: [name], MD;  Location: [facility] OR;  Service: Orthopedics;  Laterality: Left;   I & D EXTREMITY Left 03/03/2021   Procedure: LEFT LEG IRRIGATION AND DEBRIDEMENT;  Surgeon: Tarry Kos, MD;  Location: [facility] OR;  Service: Orthopedics;  Laterality: Left;   I & D EXTREMITY Left 03/07/2021   Procedure: REPEAT DEBRIDEMENT LEFT LEG;  Surgeon: [name], MD;  Location: [facility] OR;  Service: Orthopedics;  Laterality: Left;   ICD IMPLANT N/A 02/24/2023   Procedure: ICD IMPLANT;  Surgeon: Duke Salvia, MD;  Location: [facility] INVASIVE CV LAB;  Service: Cardiovascular;  Laterality: N/A;   IR FLUORO GUIDE CV LINE RIGHT  03/10/2021   IR REMOVAL TUN CV CATH W/O FL  03/24/2021   IR US GUIDE VASC ACCESS RIGHT  03/10/2021   LEFT HEART CATH AND CORONARY ANGIOGRAPHY N/A 09/13/2020   Procedure: LEFT HEART CATH AND CORONARY ANGIOGRAPHY;  Surgeon: Lennette Bihari, MD;  Location: [facility] INVASIVE CV LAB;  Service: Cardiovascular;  Laterality: N/A;   LEFT HEART CATH AND CORONARY ANGIOGRAPHY N/A 03/28/2021   Procedure: LEFT HEART CATH AND CORONARY ANGIOGRAPHY;  Surgeon: Laurey Morale, MD;  Location: [facility] INVASIVE CV LAB;  Service: Cardiovascular;  Laterality: N/A;   RIGHT HEART CATH N/A 03/26/2021   Procedure: RIGHT HEART CATH;  Surgeon: Laurey Morale, MD;  Location: [facility] INVASIVE CV LAB;  Service: Cardiovascular;  Laterality: N/A;   STUMP REVISION Left 05/06/2022   Procedure: REVISION LEFT BELOW KNEE AMPUTATION;  Surgeon: [name], MD;  Location: [facility] OR;  Service: Orthopedics;  Laterality: Left;   TEE WITHOUT CARDIOVERSION N/A 05/22/2021   Procedure: TRANSESOPHAGEAL ECHOCARDIOGRAM (TEE);  Surgeon: Corliss Skains, MD;  Location: [facility] OR;  Service: Open Heart Surgery;  Laterality: N/A;   TOE AMPUTATION  2020   TOTAL HIP ARTHROPLASTY Right 03/20/2023   Procedure: TOTAL HIP ARTHROPLASTY ANTERIOR APPROACH;  Surgeon: Kathryne Hitch, MD;  Location: [facility] OR;  Service: Orthopedics;  Laterality:  Right;   Social History   Occupational History   Occupation: none    Comment: former "[employer]"  Tobacco Use   Smoking  status: Former    Current packs/day: 0.00    Average packs/day: 3.0 packs/day for 36.0 years (108.0 ttl pk-yrs)    Types: Cigarettes    Start date: 08/27/1984    Quit date: 08/27/2020    Years since quitting: 2.7   Smokeless tobacco: Never  Vaping Use   Vaping status: Never Used  Substance and Sexual Activity   Alcohol use: Not Currently    Comment: Very rare   Drug use: Not Currently    Types: Marijuana    Comment: smoked marijuana in his youth(teenager)   Sexual activity: Not on file

## 2023-06-15 ENCOUNTER — Ambulatory Visit: Payer: Self-pay

## 2023-06-15 MED ORDER — ALBUTEROL SULFATE HFA 108 (90 BASE) MCG/ACT IN AERS: 2.0000 | INHALATION_SPRAY | Freq: Four times a day (QID) | RESPIRATORY_TRACT | 0 refills | Status: DC | PRN

## 2023-06-15 NOTE — Addendum Note (Signed)
Addended by: Hoy Register on: 06/15/2023 05:32 PM   Modules accepted: Orders

## 2023-06-15 NOTE — Telephone Encounter (Signed)
Prescription for inhaler has been sent to his pharmacy.

## 2023-06-15 NOTE — Telephone Encounter (Signed)
  Chief Complaint: Cough, sore throat SOB Symptoms: above Frequency: 2 days Pertinent Negatives: Patient denies  Disposition: [] ED /[] Urgent Care (no appt availability in office) / [] Appointment(In office/virtual)/ []  [place] [facility]/ [] Home Care/ [] Refused Recommended Disposition /[] [place] [facility]/ [x]  Follow-up with PCP Additional Notes: Pt states that he has a sore throat, cough and some mild SOB. Pt does have phlegm, but is unsure of color. Pt does not want to see. Pt would like inhaler called into pharmacy. He would like the same inhaler that was called in when he had COVID. Please advise.   Summary: Cough, sore throat, shortness of breath   The patient called in stating he has a cough, congestion., shortness of breath, and sore throat. The patient states he has had this for a few days. Please assist patient further as he does not want to come in for an appt since he lives in [place]. He requests a call back as he is currently driving. Please assist patient further     Reason for Disposition  Common cold with no complications  Answer Assessment - Initial Assessment Questions 1. RESPIRATORY STATUS: "Describe your breathing?" (e.g., wheezing, shortness of breath, unable to speak, severe coughing)      Not wheezing 2. ONSET: "When did this breathing problem begin?"      2 days - constant 3. PATTERN "Does the difficult breathing come and go, or has it been constant since it started?"      constant 4. SEVERITY: "How bad is your breathing?" (e.g., mild, moderate, severe)    - MILD: No SOB at rest, mild SOB with walking, speaks normally in sentences, can lie down, no retractions, pulse < 100.    - MODERATE: SOB at rest, SOB with minimal exertion and prefers to sit, cannot lie down flat, speaks in phrases, mild retractions, audible wheezing, pulse 100-120.    - SEVERE: Very SOB at rest, speaks in single words, struggling to breathe, sitting hunched forward, retractions, pulse  > 120      mild 5. RECURRENT SYMPTOM: "Have you had difficulty breathing before?" If Yes, ask: "When was the last time?" and "What happened that time?"      yes 6. CARDIAC HISTORY: "Do you have any history of heart disease?" (e.g., heart attack, angina, bypass surgery, angioplasty)      yes 7. LUNG HISTORY: "Do you have any history of lung disease?"  (e.g., pulmonary embolus, asthma, emphysema)     asthma 8. CAUSE: "What do you think is causing the breathing problem?"      URI 9. OTHER SYMPTOMS: "Do you have any other symptoms? (e.g., dizziness, runny nose, cough, chest pain, fever)     Sore throat  Answer Assessment - Initial Assessment Questions 1. ONSET: "When did the nasal discharge start?"      2 days  3. COUGH: "Do you have a cough?" If Yes, ask: "Describe the color of your sputum" (clear, white, yellow, green)     Yes - unknown 4. RESPIRATORY DISTRESS: "Describe your breathing."      Not wheezing -- but not perfect 5. FEVER: "Do you have a fever?" If Yes, ask: "What is your temperature, how was it measured, and when did it start?"     Fever has broken 7. OTHER SYMPTOMS: "Do you have any other symptoms?" (e.g., sore throat, earache, wheezing, vomiting)     Sore throat, congestion, SOB  Protocols used: Breathing Difficulty-A-AH, Common Cold-A-AH

## 2023-06-16 NOTE — Telephone Encounter (Signed)
Patient aware.

## 2023-06-23 ENCOUNTER — Ambulatory Visit: Payer: Medicare Other | Attending: Internal Medicine | Admitting: Internal Medicine

## 2023-06-23 DIAGNOSIS — I493 Ventricular premature depolarization: Secondary | ICD-10-CM

## 2023-06-23 DIAGNOSIS — Z95810 Presence of automatic (implantable) cardiac defibrillator: Secondary | ICD-10-CM

## 2023-06-23 NOTE — Progress Notes (Deleted)
Patient Care Team: Hoy Register, MD as PCP - General (Family Medicine) Baldo Daub, MD as PCP - Cardiology (Cardiology) Duke Salvia, MD as PCP - Electrophysiology (Cardiology) Laurey Morale, MD as PCP - Advanced Heart Failure (Cardiology) Marcy Siren, LCSW as Social Worker (Licensed Clinical Social Worker) Cristela Felt, Efraim Kaufmann, Charity fundraiser (Inactive) as Registered Nurse Marcos Eke, RN as Registered Nurse   HPI  Adam James is a 52 y.o. male seen in follow-up for ICD-Abbott   implantation (DOI 7/24) History of ischemic heart disease with prior bypass surgery  DATE TEST EF    4/22 cMRI 40%    8/22 Echo  25%    4/23 Echo  25%    10/23 Echo  30%      Date Cr K Hgb Dig  5/23     13.2    2/24 1.83 5.1   0.3                Records and Results Reviewed***  Past Medical History:  Diagnosis Date   Acute on chronic systolic CHF (congestive heart failure) (HCC) 11/04/2021   AICD (automatic cardioverter/defibrillator) present    Anemia    GI bleed; 03/13/11 EGD: relux esophagitis with ulceration/clot   CAD (coronary artery disease)    Cardiomyopathy (HCC)    Chronic HFrEF (heart failure with reduced ejection fraction) (HCC)    CKD stage 3a, GFR 45-59 ml/min (HCC)    Dehiscence of amputation stump of left lower extremity (HCC) 05/06/2022   Depression    Diabetic foot ulcer (HCC)    Dyslipidemia    Dyspnea    08/05/21- not recently.   Hypertension    Myocardial infarction (HCC)    Necrotizing fasciitis (HCC)    NSTEMI (non-ST elevated myocardial infarction) (HCC)    PVC's (premature ventricular contractions)    Type 2 diabetes mellitus (HCC)    V-tach [facility])     Past Surgical History:  Procedure Laterality Date   AMPUTATION Left 04/30/2021   Procedure: LEFT FOOT 5TH RAY AMPUTATION APPLICATION OF A-CELL POWDER;  Surgeon: [name], MD;  Location: [facility] OR;  Service: Orthopedics;  Laterality: Left;   AMPUTATION Left 07/30/2021   Procedure: LEFT  BELOW KNEE AMPUTATION;  Surgeon: [name], MD;  Location: [facility] OR;  Service: Orthopedics;  Laterality: Left;   APPENDECTOMY     APPLICATION OF WOUND VAC Left 03/03/2021   Procedure: APPLICATION OF WOUND VAC;  Surgeon: Tarry Kos, MD;  Location: [facility] OR;  Service: Orthopedics;  Laterality: Left;   APPLICATION OF WOUND VAC  03/07/2021   Procedure: APPLICATION OF WOUND VAC;  Surgeon: [name], MD;  Location: [facility] OR;  Service: Orthopedics;;   APPLICATION OF WOUND VAC  07/30/2021   Procedure: APPLICATION OF WOUND VAC;  Surgeon: [name], MD;  Location: [facility] OR;  Service: Orthopedics;;   CARDIAC CATHETERIZATION     COLON SURGERY     CORONARY ARTERY BYPASS GRAFT N/A 05/22/2021   Procedure: CORONARY ARTERY BYPASS GRAFTING (CABG) X 3 USING LEFT INTERNAL MAMMARY ARTERY AND RIGHT ENDOSCOPIC GREATER SAPHENOUS VEIN CONDUITS;  Surgeon: Corliss Skains, MD;  Location: [facility] OR;  Service: Open Heart Surgery;  Laterality: N/A;   ENDOVEIN HARVEST OF GREATER SAPHENOUS VEIN Right 05/22/2021   Procedure: ENDOVEIN HARVEST OF GREATER SAPHENOUS VEIN;  Surgeon: Corliss Skains, MD;  Location: [facility] OR;  Service: Open Heart Surgery;  Laterality: Right;   ESOPHAGOGASTRODUODENOSCOPY (EGD) WITH PROPOFOL  N/A 03/12/2021   Procedure: ESOPHAGOGASTRODUODENOSCOPY (EGD) WITH PROPOFOL;  Surgeon: Jenel Lucks, MD;  Location: [facility] ENDOSCOPY;  Service: Gastroenterology;  Laterality: N/A;   I & D EXTREMITY Left 02/24/2021   Procedure: IRRIGATION AND DEBRIDEMENT EXTREMITY;  Surgeon: Myrene Galas, MD;  Location: [facility] OR;  Service: Orthopedics;  Laterality: Left;   I & D EXTREMITY Left 02/26/2021   Procedure: IRRIGATION AND DEBRIDEMENT OF LEG;  Surgeon: [name], MD;  Location: [facility] OR;  Service: Orthopedics;  Laterality: Left;   I & D EXTREMITY Left 03/03/2021   Procedure: LEFT LEG IRRIGATION AND DEBRIDEMENT;  Surgeon: Tarry Kos, MD;  Location: [facility] OR;  Service: Orthopedics;  Laterality: Left;   I & D EXTREMITY Left  03/07/2021   Procedure: REPEAT DEBRIDEMENT LEFT LEG;  Surgeon: [name], MD;  Location: [facility] OR;  Service: Orthopedics;  Laterality: Left;   ICD IMPLANT N/A 02/24/2023   Procedure: ICD IMPLANT;  Surgeon: Duke Salvia, MD;  Location: [facility] INVASIVE CV LAB;  Service: Cardiovascular;  Laterality: N/A;   IR FLUORO GUIDE CV LINE RIGHT  03/10/2021   IR REMOVAL TUN CV CATH W/O FL  03/24/2021   IR US GUIDE VASC ACCESS RIGHT  03/10/2021   LEFT HEART CATH AND CORONARY ANGIOGRAPHY N/A 09/13/2020   Procedure: LEFT HEART CATH AND CORONARY ANGIOGRAPHY;  Surgeon: Lennette Bihari, MD;  Location: [facility] INVASIVE CV LAB;  Service: Cardiovascular;  Laterality: N/A;   LEFT HEART CATH AND CORONARY ANGIOGRAPHY N/A 03/28/2021   Procedure: LEFT HEART CATH AND CORONARY ANGIOGRAPHY;  Surgeon: Laurey Morale, MD;  Location: [facility] INVASIVE CV LAB;  Service: Cardiovascular;  Laterality: N/A;   RIGHT HEART CATH N/A 03/26/2021   Procedure: RIGHT HEART CATH;  Surgeon: Laurey Morale, MD;  Location: [facility] INVASIVE CV LAB;  Service: Cardiovascular;  Laterality: N/A;   STUMP REVISION Left 05/06/2022   Procedure: REVISION LEFT BELOW KNEE AMPUTATION;  Surgeon: [name], MD;  Location: [facility] OR;  Service: Orthopedics;  Laterality: Left;   TEE WITHOUT CARDIOVERSION N/A 05/22/2021   Procedure: TRANSESOPHAGEAL ECHOCARDIOGRAM (TEE);  Surgeon: Corliss Skains, MD;  Location: [facility] OR;  Service: Open Heart Surgery;  Laterality: N/A;   TOE AMPUTATION  2020   TOTAL HIP ARTHROPLASTY Right 03/20/2023   Procedure: TOTAL HIP ARTHROPLASTY ANTERIOR APPROACH;  Surgeon: Kathryne Hitch, MD;  Location: [facility] OR;  Service: Orthopedics;  Laterality: Right;    No outpatient medications have been marked as taking for the 06/23/23 encounter (Appointment) with Duke Salvia, MD.    Allergies  Allergen Reactions   Morphine Itching    Itching under skin      Review of Systems negative except from HPI and PMH  Physical Exam There were no  vitals taken for this visit. Well developed and well nourished in no acute distress HENT normal E scleral and icterus clear Neck Supple JVP flat; carotids brisk and full Clear to ausculation {CARD RHYTHM:10874} ***Regular rate and rhythm, no murmurs gallops or rub Soft with active bowel sounds No clubbing cyanosis {Numbers; edema:17696} Edema Alert and oriented, grossly normal motor and sensory function Skin Warm and Dry  ECG ***  CrCl cannot be calculated (Patient's most recent lab result is older than the maximum 21 days allowed.).   Assessment and  Plan  Ischemic cardiomyopathy s/p CABG   Congestive heart failure class *2   PVCs on amiodarone   Status post left BKA with healed stump  Current medicines are reviewed at length with the patient today .  The patient does not*** have concerns regarding medicines.

## 2023-07-01 ENCOUNTER — Encounter: Payer: Self-pay | Admitting: Cardiology

## 2023-07-06 NOTE — Progress Notes (Signed)
Remote ICD transmission.   

## 2023-08-18 ENCOUNTER — Encounter: Payer: Medicare Other | Admitting: Family Medicine

## 2023-08-20 ENCOUNTER — Other Ambulatory Visit: Payer: Self-pay | Admitting: Internal Medicine

## 2023-08-20 DIAGNOSIS — G548 Other nerve root and plexus disorders: Secondary | ICD-10-CM

## 2023-08-20 DIAGNOSIS — E1142 Type 2 diabetes mellitus with diabetic polyneuropathy: Secondary | ICD-10-CM

## 2023-08-20 NOTE — Telephone Encounter (Signed)
Requested by interface surescripts. Medication discontinued 03/23/23.  Requested Prescriptions  Refused Prescriptions Disp Refills   gabapentin (NEURONTIN) 300 MG capsule [Pharmacy Med Name: GABAPENTIN 300MG  CAPSULES] 90 capsule 0    Sig: TAKE 1 CAPSULE(300 MG) BY MOUTH AT BEDTIME     Neurology: Anticonvulsants - gabapentin Failed - 08/20/2023  1:01 PM      Failed - Cr in normal range and within 360 days    Creatinine, Ser  Date Value Ref Range Status  06/01/2023 1.79 (H) 0.76 - 1.27 mg/dL Final   Creatinine, Urine  Date Value Ref Range Status  03/02/2021 100.20 mg/dL Final         Passed - Completed PHQ-2 or PHQ-9 in the last 360 days      Passed - Valid encounter within last 12 months    Recent Outpatient Visits           2 months ago Type 2 diabetes mellitus with other circulatory complication, without long-term current use of insulin (HCC)   [facility] [place]. [facility] Hoy Register, MD   8 months ago Type 2 diabetes mellitus with other circulatory complication, without long-term current use of insulin (HCC)   [facility] - A Dept Of [place]. [facility] Hoy Register, MD   11 months ago Type 2 diabetes mellitus with other circulatory complication, without long-term current use of insulin (HCC)   [facility] - A Dept Of [place]. [facility] [place] [place], [place]   1 year ago Type 2 diabetes mellitus with other circulatory complication, without long-term current use of insulin (HCC)   [facility] Of [place]. [facility] Hoy Register, MD   1 year ago S/P CABG x 3   [facility] - A Dept Of . [facility] Hoy Register, MD

## 2023-08-25 ENCOUNTER — Telehealth: Payer: Self-pay

## 2023-08-25 MED ORDER — GABAPENTIN 100 MG PO CAPS: 200.0000 mg | ORAL_CAPSULE | Freq: Every day | ORAL | 2 refills | Status: DC

## 2023-08-25 NOTE — Telephone Encounter (Signed)
Patient requesting refill on Gabapentin    Copied from CRM [id number]. Topic: Clinical - Prescription Issue >> Aug 25, 2023  2:23 PM Marlow Baars wrote: Reason for CRM: The patient called in stating he didn't under why his gabapentin (NEURONTIN) 100 MG capsule refill was being denied for his phantom pain. He states the provider who last prescribed it was in the hospital and he really needs it. Please assist patient further as he uses  [id number] [place], [place] - [address]  Phone: [phone] Fax: [phone]

## 2023-08-25 NOTE — Telephone Encounter (Signed)
Copied from CRM [id number]. Topic: Clinical - Prescription Issue >> Aug 25, 2023  2:23 PM Marlow Baars wrote: Reason for CRM: The patient called in stating he didn't under why his gabapentin (NEURONTIN) 100 MG capsule refill was being denied for his phantom pain. He states the provider who last prescribed it was in the hospital and he really needs it. Please assist patient further as he uses  [id number], [place] - [address]  Phone: [phone] Fax: [phone]

## 2023-08-25 NOTE — Telephone Encounter (Signed)
This has been refilled.

## 2023-08-25 NOTE — Addendum Note (Signed)
Addended by: Hoy Register on: 08/25/2023 05:20 PM   Modules accepted: Orders

## 2023-08-26 ENCOUNTER — Telehealth: Payer: Self-pay

## 2023-08-26 NOTE — Telephone Encounter (Signed)
Patient denies any fluid retention symptoms. States he has furosemide on hand and was told by Dr. Shirlee Latch to take as needed.  Advised patient I will forward to Dr. Shirlee Latch to advise.

## 2023-08-26 NOTE — Telephone Encounter (Signed)
Alert received from [employer] for HF diagnositc currently abnormal with impedance status x 7 days.  Attempted to contact patient. No answer, LMTCB.

## 2023-08-26 NOTE — Telephone Encounter (Signed)
He has had several [employer] alerts recently.  Would start lasix 20 mg daily.  BMET 1 week.

## 2023-08-26 NOTE — Telephone Encounter (Signed)
Patient has been informed of medication being sent to pharmacy.

## 2023-08-26 NOTE — Telephone Encounter (Signed)
Please see attached from Dr Greig Castilla NP-C  2:07 PM

## 2023-08-27 MED ORDER — FUROSEMIDE 20 MG PO TABS: 20.0000 mg | ORAL_TABLET | Freq: Every day | ORAL | 11 refills | Status: DC

## 2023-08-27 NOTE — Addendum Note (Signed)
Addended by: Theresia Bough on: 08/27/2023 12:07 PM   Modules accepted: Orders

## 2023-08-27 NOTE — Telephone Encounter (Signed)
Pt aware  Repeat labs 2/7

## 2023-09-02 ENCOUNTER — Telehealth (HOSPITAL_COMMUNITY): Payer: Self-pay | Admitting: Cardiology

## 2023-09-02 NOTE — Telephone Encounter (Signed)
 09/02/2023 at 10:30- Called patient to confirm appointment, voicemail was full. Could not leave a message

## 2023-09-03 ENCOUNTER — Other Ambulatory Visit (HOSPITAL_COMMUNITY): Payer: Medicare Other

## 2023-09-07 ENCOUNTER — Ambulatory Visit (INDEPENDENT_AMBULATORY_CARE_PROVIDER_SITE_OTHER): Payer: Self-pay

## 2023-09-07 DIAGNOSIS — I255 Ischemic cardiomyopathy: Secondary | ICD-10-CM | POA: Diagnosis not present

## 2023-09-07 LAB — CUP PACEART REMOTE DEVICE CHECK
Battery Remaining Longevity: 112 mo
Battery Remaining Percentage: 94 %
Battery Voltage: 3.07 V
Brady Statistic RV Percent Paced: 0 %
Date Time Interrogation Session: 20250211020814
HighPow Impedance: 71 Ohm
Implantable Lead Connection Status: 753985
Implantable Lead Implant Date: 20240731
Implantable Lead Location: 753860
Implantable Pulse Generator Implant Date: 20240731
Lead Channel Impedance Value: 730 Ohm
Lead Channel Pacing Threshold Amplitude: 0.5 V
Lead Channel Pacing Threshold Pulse Width: 0.5 ms
Lead Channel Sensing Intrinsic Amplitude: 12 mV
Lead Channel Setting Pacing Amplitude: 3.5 V
Lead Channel Setting Pacing Pulse Width: 0.5 ms
Lead Channel Setting Sensing Sensitivity: 0.5 mV
Pulse Gen Serial Number: 111074047
Zone Setting Status: 755011

## 2023-09-22 ENCOUNTER — Ambulatory Visit: Payer: Medicare Other | Attending: Family Medicine | Admitting: Family Medicine

## 2023-09-22 ENCOUNTER — Encounter: Payer: Self-pay | Admitting: Family Medicine

## 2023-09-22 VITALS — BP 105/74 | HR 92 | Ht 75.0 in | Wt 254.2 lb

## 2023-09-22 DIAGNOSIS — Z0000 Encounter for general adult medical examination without abnormal findings: Secondary | ICD-10-CM

## 2023-09-22 DIAGNOSIS — F17210 Nicotine dependence, cigarettes, uncomplicated: Secondary | ICD-10-CM

## 2023-09-22 NOTE — Patient Instructions (Addendum)
  [name] , Thank you for taking time to come for your Medicare Wellness Visit. I appreciate your ongoing commitment to your health goals. Please review the following plan we discussed and let me know if I can assist you in the future.   These are the goals we discussed:  Goals      Activity and Exercise Increased     Evidence-based guidance:  Review current exercise levels.  Assess patient perspective on exercise or activity level, barriers to increasing activity, motivation and readiness for change.  Recommend or set healthy exercise goal based on individual tolerance.  Encourage small steps toward making change in amount of exercise or activity.  Urge reduction of sedentary activities or screen time.  Promote group activities within the community or with family or support person.  Consider referral to rehabiliation therapist for assessment and exercise/activity plan.   Notes:         This is a list of the screening recommended for you and due dates:  Health Maintenance  Topic Date Due   COVID-19 Vaccine (1) Never done   Pneumococcal Vaccination (1 of 2 - PCV) Never done   Eye exam for diabetics  Never done   Zoster (Shingles) Vaccine (1 of 2) Never done   Screening for Lung Cancer  Never done   Flu Shot  10/25/2023*   Colon Cancer Screening  12/02/2023*   DTaP/Tdap/Td vaccine (1 - Tdap) 05/31/2024*   Hemoglobin A1C  11/29/2023   Complete foot exam   12/02/2023   Yearly kidney function blood test for diabetes  05/31/2024   Yearly kidney health urinalysis for diabetes  05/31/2024   Medicare Annual Wellness Visit  09/21/2024   Hepatitis C Screening  Completed   HIV Screening  Completed   HPV Vaccine  Aged Out  *Topic was postponed. The date shown is not the original due date.

## 2023-09-22 NOTE — Progress Notes (Signed)
 Subjective:    Adam James is a 53 y.o. male who presents for a Welcome to Medicare exam.   Cardiac Risk Factors include: obesity (BMI >30kg/m2);diabetes mellitus;hypertension;male gender     Objective:    Today's Vitals   09/22/23 1405  BP: 105/74  Pulse: 92  SpO2: 97%  Weight: 254 lb 3.2 oz (115.3 kg)  Height: 6\' 3"  (1.905 m)   Body mass index is 31.77 kg/m.  Medications Outpatient Encounter Medications as of 09/22/2023  Medication Sig   acetaminophen (TYLENOL) 325 MG tablet Take 2 tablets (650 mg total) by mouth every 6 (six) hours as needed for mild pain (or Fever >/= 101).   albuterol (VENTOLIN HFA) 108 (90 Base) MCG/ACT inhaler Inhale 2 puffs into the lungs every 6 (six) hours as needed for wheezing or shortness of breath.   amiodarone (PACERONE) 200 MG tablet Take 1 tablet (200 mg total) by mouth daily.   ASPIRIN 81 PO Take 1 tablet by mouth daily.   carvedilol (COREG) 6.25 MG tablet Take 1 tablet (6.25 mg total) by mouth 2 (two) times daily with a meal.   empagliflozin (JARDIANCE) 10 MG TABS tablet Take 1 tablet (10 mg total) by mouth daily. Please make appointment with Dr. Alvis Lemmings for more refills.   eplerenone (INSPRA) 25 MG tablet Take 1 tablet (25 mg total) by mouth daily.   furosemide (LASIX) 20 MG tablet Take 1 tablet (20 mg total) by mouth daily.   gabapentin (NEURONTIN) 100 MG capsule Take 2 capsules (200 mg total) by mouth at bedtime.   glipiZIDE (GLUCOTROL) 5 MG tablet Take 1 tablet (5 mg total) by mouth daily before breakfast.   glucose blood (TRUE METRIX BLOOD GLUCOSE TEST) test strip Use as instructed twice daily   rosuvastatin (CRESTOR) 20 MG tablet Take 1 tablet (20 mg total) by mouth daily.   Semaglutide, 2 MG/DOSE, 8 MG/3ML SOPN Inject 2 mg as directed once a week.   TRUEplus Lancets 28G MISC Use as directed twice daily at 8 am and 10 pm.   Blood Pressure Monitoring (BLOOD PRESSURE CUFF) MISC 1 each by Does not apply route as directed. (Patient not  taking: Reported on 06/07/2023)   oxyCODONE-acetaminophen (PERCOCET/ROXICET) 5-325 MG tablet Take 1 tablet by mouth every 6 (six) hours as needed for severe pain. (Patient not taking: Reported on 09/22/2023)   No facility-administered encounter medications on file as of 09/22/2023.     History: Past Medical History:  Diagnosis Date   Acute on chronic systolic CHF (congestive heart failure) (HCC) 11/04/2021   AICD (automatic cardioverter/defibrillator) present    Anemia    GI bleed; 03/13/11 EGD: relux esophagitis with ulceration/clot   CAD (coronary artery disease)    Cardiomyopathy (HCC)    Chronic HFrEF (heart failure with reduced ejection fraction) (HCC)    CKD stage 3a, GFR 45-59 ml/min (HCC)    Dehiscence of amputation stump of left lower extremity (HCC) 05/06/2022   Depression    Diabetic foot ulcer (HCC)    Dyslipidemia    Dyspnea    08/05/21- not recently.   Hypertension    Myocardial infarction [facility])    Necrotizing fasciitis (HCC)    NSTEMI (non-ST elevated myocardial infarction) (HCC)    PVC's (premature ventricular contractions)    Type 2 diabetes mellitus (HCC)    V-tach [facility])    Past Surgical History:  Procedure Laterality Date   AMPUTATION Left 04/30/2021   Procedure: LEFT FOOT 5TH RAY AMPUTATION APPLICATION OF A-CELL POWDER;  Surgeon:  [name], MD;  Location: [facility] OR;  Service: Orthopedics;  Laterality: Left;   AMPUTATION Left 07/30/2021   Procedure: LEFT BELOW KNEE AMPUTATION;  Surgeon: [name], MD;  Location: [facility] OR;  Service: Orthopedics;  Laterality: Left;   APPENDECTOMY     APPLICATION OF WOUND VAC Left 03/03/2021   Procedure: APPLICATION OF WOUND VAC;  Surgeon: Tarry Kos, MD;  [facility] OR;  Service: Orthopedics;  Laterality: Left;   APPLICATION OF WOUND VAC  03/07/2021   Procedure: APPLICATION OF WOUND VAC;  Surgeon: [name], MD;  Location[facility] OR;  Service: Orthopedics;;   APPLICATION OF WOUND VAC  07/30/2021   Procedure: APPLICATION OF  WOUND VAC;  Surgeon: [name], MD;  [facility]: [facility] OR;  Service: Orthopedics;;   CARDIAC CATHETERIZATION     COLON SURGERY     CORONARY ARTERY BYPASS GRAFT N/A 05/22/2021   Procedure: CORONARY ARTERY BYPASS GRAFTING (CABG) X 3 USING LEFT INTERNAL MAMMARY ARTERY AND RIGHT ENDOSCOPIC GREATER SAPHENOUS VEIN CONDUITS;  Surgeon: Corliss Skains, MD;  [facility] OR;  Service: Open Heart Surgery;  Laterality: N/A;   ENDOVEIN HARVEST OF GREATER SAPHENOUS VEIN Right 05/22/2021   Procedure: ENDOVEIN HARVEST OF GREATER SAPHENOUS VEIN;  Surgeon: Corliss Skains, MD;  [facility] OR;  Service: Open Heart Surgery;  Laterality: Right;   ESOPHAGOGASTRODUODENOSCOPY (EGD) WITH PROPOFOL N/A 03/12/2021   Procedure: ESOPHAGOGASTRODUODENOSCOPY (EGD) WITH PROPOFOL;  Surgeon: Jenel Lucks, MD;  Location: [facility] ENDOSCOPY;  Service: Gastroenterology;  Laterality: N/A;   I & D EXTREMITY Left 02/24/2021   Procedure: IRRIGATION AND DEBRIDEMENT EXTREMITY;  Surgeon: Myrene Galas, MD;  Location: [facility] OR;  Service: Orthopedics;  Laterality: Left;   I & D EXTREMITY Left 02/26/2021   Procedure: IRRIGATION AND DEBRIDEMENT OF LEG;  Surgeon: [name], MD;  Location: [facility] OR;  Service: Orthopedics;  Laterality: Left;   I & D EXTREMITY Left 03/03/2021   Procedure: LEFT LEG IRRIGATION AND DEBRIDEMENT;  Surgeon: Tarry Kos, MD;  [facility] OR;  Service: Orthopedics;  Laterality: Left;   I & D EXTREMITY Left 03/07/2021   Procedure: REPEAT DEBRIDEMENT LEFT LEG;  Surgeon: [name], MD;  Location: [facility] OR;  Service: Orthopedics;  Laterality: Left;   ICD IMPLANT N/A 02/24/2023   Procedure: ICD IMPLANT;  Surgeon: Duke Salvia, MD;  Location: [facility] INVASIVE CV LAB;  Service: Cardiovascular;  Laterality: N/A;   IR FLUORO GUIDE CV LINE RIGHT  03/10/2021   IR REMOVAL TUN CV CATH W/O FL  03/24/2021   IR US GUIDE VASC ACCESS RIGHT  03/10/2021   LEFT HEART CATH AND CORONARY ANGIOGRAPHY N/A 09/13/2020   Procedure: LEFT HEART  CATH AND CORONARY ANGIOGRAPHY;  Surgeon: Lennette Bihari, MD;  [facility] INVAS[facility] CV LAB;  Service: Cardiovascular;  Laterality: N/A;   LEFT HEART CATH AND CORONARY ANGIOGRAPHY N/A 03/28/2021   Procedure: LEFT HEART CATH AND CORONARY ANGIOGRAPHY;  Surgeon: Laurey Morale, MD;  Location: [facility] INVASIVE CV LAB;  Service: Cardiovascular;  Laterality: N/A;   RIGHT HEART CATH N/A 03/26/2021   Procedure: RIGHT HEART CATH;  Surgeon: Laurey Morale, MD;  Location: [facility] INVASIVE CV LAB;  Service: Cardiovascular;  Laterality: N/A;   STUMP REVISION Left 05/06/2022   Procedure: REVISION LEFT BELOW KNEE AMPUTATION;  Surgeon: [name], MD;  Location: [facility] OR;  Service: Orthopedics;  Laterality: Left;   TEE WITHOUT CARDIOVERSION N/A 05/22/2021   Procedure: TRANSESOPHAGEAL ECHOCARDIOGRAM (TEE);  Surgeon: Cliffton Asters,  Eliezer Lofts, MD;  [facility] OR;  Service: Open Heart Surgery;  Laterality: N/A;   TOE AMPUTATION  2020   TOTAL HIP ARTHROPLASTY Right 03/20/2023   Procedure: TOTAL HIP ARTHROPLASTY ANTERIOR APPROACH;  Surgeon: Kathryne Hitch, MD;  [facility] OR;  Service: Orthopedics;  Laterality: Right;    Family History  Problem Relation Age of Onset   Hypertension Mother    Heart failure Father    Hypertension Brother    Congestive Heart Failure Brother    Social History   Occupational History   Occupation: none    Comment: former "[employer]"  Tobacco Use   Smoking status: Former    Current packs/day: 0.00    Average packs/day: 3.0 packs/day for 36.0 years (108.0 ttl pk-yrs)    Types: Cigarettes    Start date: 08/27/1984    Quit date: 08/27/2020    Years since quitting: 3.0   Smokeless tobacco: Never  Vaping Use   Vaping status: Never Used  Substance and Sexual Activity   Alcohol use: Not Currently    Comment: Very rare   Drug use: Not Currently    Types: Marijuana    Comment: smoked marijuana in his youth(teenager)   Sexual activity: Not on file    Tobacco Counseling Counseling  given: Not Answered Quit smoking  Immunizations and Health Maintenance Declines immunizations There is no immunization history on file for this patient. Health Maintenance Due  Topic Date Due   COVID-19 Vaccine (1) Never done   OPHTHALMOLOGY EXAM  Never done   Zoster Vaccines- Shingrix (1 of 2) Never done   Lung Cancer Screening  Never done    Activities of Daily Living    09/22/2023    2:06 PM 03/19/2023    5:13 PM  In your present state of health, do you have any difficulty performing the following activities:  Hearing? 0 0  Vision? 0 0  Difficulty concentrating or making decisions? 1 1  Comment  forgetful  Walking or climbing stairs? 1 0  Dressing or bathing? 0 0  Doing errands, shopping? 0 0  Preparing Food and eating ? N   Using the Toilet? N   In the past six months, have you accidently leaked urine? N   Do you have problems with loss of bowel control? N   Managing your Medications? N   Managing your Finances? N   Housekeeping or managing your Housekeeping? N     Physical Exam   Constitutional: normal appearing,  Eyes: PERRLA HEENT: Head is atraumatic, normal sinuses, normal oropharynx, normal appearing tonsils and palate, tympanic membrane is normal bilaterally. Neck: normal range of motion, no thyromegaly, no JVD Cardiovascular: normal rate and rhythm, normal heart sounds, no murmurs, rub or gallop, no pedal edema Respiratory: Normal breath sounds, clear to auscultation bilaterally, no wheezes, no rales, no rhonchi Abdomen: soft, not tender to palpation, normal bowel sounds, no enlarged organs Musculoskeletal: Left BKA Skin: warm and dry, no lesions. Neurological: alert, oriented x3, cranial nerves I-XII grossly intact , normal motor strength, normal sensation. Psychological: normal mood.   Advanced Directives: Does Patient Have a Medical Advance Directive?: No Would patient like information on creating a medical advance directive?: Yes (ED - Information  included in AVS)        Assessment:    This is a routine wellness  examination for this patient .   Vision/Hearing screen Vision Screening   Right eye Left eye Both eyes  Without correction 20/15 20/20   With  correction        Goals      Activity and Exercise Increased     Evidence-based guidance:  Review current exercise levels.  Assess patient perspective on exercise or activity level, barriers to increasing activity, motivation and readiness for change.  Recommend or set healthy exercise goal based on individual tolerance.  Encourage small steps toward making change in amount of exercise or activity.  Urge reduction of sedentary activities or screen time.  Promote group activities within the community or with family or support person.  Consider referral to rehabiliation therapist for assessment and exercise/activity plan.   Notes:          Depression Screen    09/22/2023    2:14 PM 06/01/2023    3:00 PM 12/02/2022   11:29 AM 08/31/2022    9:31 AM  PHQ 2/9 Scores  PHQ - 2 Score 3 2 3 5   PHQ- 9 Score 15 8 12 18      Fall Risk    09/22/2023    2:13 PM  Fall Risk   Falls in the past year? 0  Number falls in past yr: 0  Injury with Fall? 0  Risk for fall due to : No Fall Risks  Follow up Falls evaluation completed    Cognitive Function        09/22/2023    2:14 PM  6CIT Screen  What Year? 0 points  What month? 0 points  What time? 0 points  Count back from 20 0 points  Months in reverse 0 points  Repeat phrase 0 points  Total Score 0 points    Patient Care Team: Hoy Register, MD as PCP - General (Family Medicine) Baldo Daub, MD as PCP - Cardiology (Cardiology) Duke Salvia, MD as PCP - Electrophysiology (Cardiology) Laurey Morale, MD as PCP - Advanced Heart Failure (Cardiology) Marcy Siren, LCSW as Social Worker (Licensed Clinical Social Worker) Tuck, Efraim Kaufmann, Charity fundraiser (Inactive) as Registered Nurse Marcos Eke, RN as  Registered Nurse     Plan:   1. Encounter for Medicare annual wellness exam (Primary) Counseled on 150 minutes of exercise per week, healthy eating (including decreased daily intake of saturated fats, cholesterol, added sugars, sodium), routine healthcare maintenance.  - Amb Referral To Provider Referral Exercise Program (P.R.E.P)  2. Smoking greater than 20 pack years -Previous history of nicotine dependence - Amb Referral To Provider Referral Exercise Program (P.R.E.P) - CT CHEST LUNG CANCER SCREENING LOW DOSE WO CONTRAST; Future - LP+Non-HDL Cholesterol   I have personally reviewed and noted the following in the patient's chart:   Medical and social history Use of alcohol, tobacco or illicit drugs  Current medications and supplements Functional ability and status Nutritional status Physical activity Advanced directives List of other physicians Hospitalizations, surgeries, and ER visits in previous 12 months Vitals Screenings to include cognitive, depression, and falls Referrals and appointments  In addition, I have reviewed and discussed with patient certain preventive protocols, quality metrics, and best practice recommendations. A written personalized care plan for preventive services as well as general preventive health recommendations were provided to patient.     Hoy Register, MD 09/22/2023

## 2023-09-23 LAB — LP+NON-HDL CHOLESTEROL
Cholesterol, Total: 150 mg/dL (ref 100–199)
HDL: 42 mg/dL (ref >39)
LDL Chol Calc (NIH): 85 mg/dL (ref 0–99)
Total Non-HDL-Chol (LDL+VLDL): 108 mg/dL (ref 0–129)
Triglycerides: 130 mg/dL (ref 0–149)
VLDL Cholesterol Cal: 23 mg/dL (ref 5–40)

## 2023-09-29 ENCOUNTER — Other Ambulatory Visit: Payer: Self-pay | Admitting: Family Medicine

## 2023-09-29 DIAGNOSIS — F3289 Other specified depressive episodes: Secondary | ICD-10-CM

## 2023-09-30 NOTE — Telephone Encounter (Signed)
 Requested medication (s) are due for refill today - unsure  Requested medication (s) are on the active medication list -yes  Future visit scheduled -no  Last refill: 06/15/23  Notes to clinic: Rx for acute call/problem- do you want to continue?  Requested Prescriptions  Pending Prescriptions Disp Refills   albuterol (VENTOLIN HFA) 108 (90 Base) MCG/ACT inhaler [Pharmacy Med Name: ALBUTEROL HFA INH (200 PUFFS) 6.7GM] 6.7 g     Sig: INHALE 2 PUFFS INTO THE LUNGS EVERY 6 HOURS AS NEEDED FOR WHEEZING OR SHORTNESS OF BREATH     Pulmonology:  Beta Agonists 2 Passed - 09/30/2023  1:29 PM      Passed - Last BP in normal range    BP Readings from Last 1 Encounters:  09/22/23 105/74         Passed - Last Heart Rate in normal range    Pulse Readings from Last 1 Encounters:  09/22/23 92         Passed - Valid encounter within last 12 months    Recent Outpatient Visits           1 week ago Encounter for [employer] annual wellness exam   [facility] - A Dept Of [place]. [facility] Hoy Register, MD   4 months ago Type 2 diabetes mellitus with other circulatory complication, without long-term current use of insulin (HCC)   [place] [facility] [place] - A Dept Of [place]. [facility] Hoy Register, MD   10 months ago Type 2 diabetes mellitus with other circulatory complication, without long-term current use of insulin (HCC)   [place] [facility] [place] - A Dept Of [place]. [facility] Hoy Register, MD   1 year ago Type 2 diabetes mellitus with other circulatory complication, without long-term current use of insulin (HCC)   [facility] - A Dept Of [place]. [facility] [place], [place], [place]   1 year ago Type 2 diabetes mellitus with other circulatory complication, without long-term current use of insulin (HCC)   [facility] [facility] - A Dept Of [place]. [facility] Hoy Register, MD              Refused Prescriptions Disp Refills   venlafaxine XR (EFFEXOR-XR) 75 MG 24 hr capsule [Pharmacy Med Name: VENLAFAXINE ER 75MG  CAPSULES] 30 capsule 0    Sig: TAKE 1 CAPSULE(75 MG) BY MOUTH DAILY WITH BREAKFAST     Psychiatry: Antidepressants - SNRI - desvenlafaxine & venlafaxine Failed - 09/30/2023  1:29 PM      Failed - Cr in normal range and within 360 days    Creatinine, Ser  Date Value Ref Range Status  06/01/2023 1.79 (H) 0.76 - 1.27 mg/dL Final   Creatinine, Urine  Date Value Ref Range Status  03/02/2021 100.20 mg/dL Final         Failed - Lipid Panel in normal range within the last 12 months    Cholesterol, Total  Date Value Ref Range Status  09/22/2023 150 100 - 199 mg/dL Final   LDL Chol Calc (NIH)  Date Value Ref Range Status  09/22/2023 85 0 - 99 mg/dL Final   HDL  Date Value Ref Range Status  09/22/2023 42 >39 mg/dL Final   Triglycerides  Date Value Ref Range Status  09/22/2023 130 0 - 149 mg/dL Final         Passed - Completed PHQ-2  or PHQ-9 in the last 360 days      Passed - Last BP in normal range    BP Readings from Last 1 Encounters:  09/22/23 105/74         Passed - Valid encounter within last 6 months    Recent Outpatient Visits           1 week ago Encounter for [employer] annual wellness exam   [place] [facility] [place] - A Dept Of [place]. [facility] Hoy Register, MD   4 months ago Type 2 diabetes mellitus with other circulatory complication, without long-term current use of insulin (HCC)   [place] [facility] [place] - A Dept Of [place]. [facility] Hoy Register, MD   10 months ago Type 2 diabetes mellitus with other circulatory complication, without long-term current use of insulin (HCC)   [place] [facility] [place] - A Dept Of [place]. [facility] Hoy Register, MD   1 year ago Type 2 diabetes mellitus with other circulatory complication, without long-term  current use of insulin (HCC)   [place] [facility] [place] - A Dept Of [place]. [facility] [place], [place], [place]   1 year ago Type 2 diabetes mellitus with other circulatory complication, without long-term current use of insulin (HCC)   [place] [facility] - A Dept Of [place]. [facility] Hoy Register, MD                 Requested Prescriptions  Pending Prescriptions Disp Refills   albuterol (VENTOLIN HFA) 108 (90 Base) MCG/ACT inhaler [Pharmacy Med Name: ALBUTEROL HFA INH (200 PUFFS) 6.7GM] 6.7 g     Sig: INHALE 2 PUFFS INTO THE LUNGS EVERY 6 HOURS AS NEEDED FOR WHEEZING OR SHORTNESS OF BREATH     Pulmonology:  Beta Agonists 2 Passed - 09/30/2023  1:29 PM      Passed - Last BP in normal range    BP Readings from Last 1 Encounters:  09/22/23 105/74         Passed - Last Heart Rate in normal range    Pulse Readings from Last 1 Encounters:  09/22/23 92         Passed - Valid encounter within last 12 months    Recent Outpatient Visits           1 week ago Encounter for [employer] annual wellness exam   [place] [facility] [place] - [facility] Dept Of [place]. [facility] Hoy Register, MD   4 months ago Type 2 diabetes mellitus with other circulatory complication, without long-term current use of insulin (HCC)   [place] [facility] [place] - A Dept Of [place]. [facility] Hoy Register, MD   10 months ago Type 2 diabetes mellitus with other circulatory complication, without long-term current use of insulin (HCC)   [place] [facility] [place] - A Dept Of [place]. [facility] Hoy Register, MD   1 year ago Type 2 diabetes mellitus with other circulatory complication, without long-term current use of insulin (HCC)   [place] [facility] [place] - A Dept Of [place]. [facility], [place], [place]   1 year ago Type 2 diabetes mellitus with other circulatory complication, without  long-term current use of insulin (HCC)   [place] [facility] - A Dept Of . [facility] Hoy Register, MD  Refused Prescriptions Disp Refills   venlafaxine XR (EFFEXOR-XR) 75 MG 24 hr capsule [Pharmacy Med Name: VENLAFAXINE ER 75MG  CAPSULES] 30 capsule 0    Sig: TAKE 1 CAPSULE(75 MG) BY MOUTH DAILY WITH BREAKFAST     Psychiatry: Antidepressants - SNRI - desvenlafaxine & venlafaxine Failed - 09/30/2023  1:29 PM      Failed - Cr in normal range and within 360 days    Creatinine, Ser  Date Value Ref Range Status  06/01/2023 1.79 (H) 0.76 - 1.27 mg/dL Final   Creatinine, Urine  Date Value Ref Range Status  03/02/2021 100.20 mg/dL Final         Failed - Lipid Panel in normal range within the last 12 months    Cholesterol, Total  Date Value Ref Range Status  09/22/2023 150 100 - 199 mg/dL Final   LDL Chol Calc (NIH)  Date Value Ref Range Status  09/22/2023 85 0 - 99 mg/dL Final   HDL  Date Value Ref Range Status  09/22/2023 42 >39 mg/dL Final   Triglycerides  Date Value Ref Range Status  09/22/2023 130 0 - 149 mg/dL Final         Passed - Completed PHQ-2 or PHQ-9 in the last 360 days      Passed - Last BP in normal range    BP Readings from Last 1 Encounters:  09/22/23 105/74         Passed - Valid encounter within last 6 months    Recent Outpatient Visits           1 week ago Encounter for [employer] annual wellness exam   [place] [facility] [place] - A Dept Of [place]. [facility] Hoy Register, MD   4 months ago Type 2 diabetes mellitus with other circulatory complication, without long-term current use of insulin (HCC)   [facility] [facility] [place] - A Dept Of [place]. [facility] Hoy Register, MD   10 months ago Type 2 diabetes mellitus with other circulatory complication, without long-term current use of insulin (HCC)   [place] [facility] [place] - A Dept Of [place]. [facility] Hoy Register, MD   1 year ago Type 2 diabetes mellitus with other circulatory complication, without long-term current use of insulin (HCC)   [facility] [place] - A Dept Of [place]. [facility], [place], [place]   1 year ago Type 2 diabetes mellitus with other circulatory complication, without long-term current use of insulin (HCC)    [facility] - A Dept Of [place]. [facility] Hoy Register, MD

## 2023-09-30 NOTE — Telephone Encounter (Signed)
 Medication no longer on current medication list Requested Prescriptions  Pending Prescriptions Disp Refills   albuterol (VENTOLIN HFA) 108 (90 Base) MCG/ACT inhaler [Pharmacy Med Name: ALBUTEROL HFA INH (200 PUFFS) 6.7GM] 6.7 g     Sig: INHALE 2 PUFFS INTO THE LUNGS EVERY 6 HOURS AS NEEDED FOR WHEEZING OR SHORTNESS OF BREATH     Pulmonology:  Beta Agonists 2 Passed - 09/30/2023  1:28 PM      Passed - Last BP in normal range    BP Readings from Last 1 Encounters:  09/22/23 105/74         Passed - Last Heart Rate in normal range    Pulse Readings from Last 1 Encounters:  09/22/23 92         Passed - Valid encounter within last 12 months    Recent Outpatient Visits           1 week ago Encounter for [employer] annual wellness exam   [place] [facility] - A Dept Of [place]. [facility] Hoy Register, MD   4 months ago Type 2 diabetes mellitus with other circulatory complication, without long-term current use of insulin (HCC)   [place] [facility] [place] - A Dept Of [place]. [facility] Hoy Register, MD   10 months ago Type 2 diabetes mellitus with other circulatory complication, without long-term current use of insulin (HCC)   [place] [facility] [place] - A Dept Of [place]. [facility] Hoy Register, MD   1 year ago Type 2 diabetes mellitus with other circulatory complication, without long-term current use of insulin (HCC)   [place] [facility] [place] - A Dept Of [place]. [facility] [place], [place], [place]   1 year ago Type 2 diabetes mellitus with other circulatory complication, without long-term current use of insulin (HCC)    [facility] - A Dept Of [place]. [facility] Hoy Register, MD              Refused Prescriptions Disp Refills   venlafaxine XR (EFFEXOR-XR) 75 MG 24 hr capsule [Pharmacy Med Name: VENLAFAXINE ER 75MG  CAPSULES] 30 capsule 0    Sig: TAKE 1 CAPSULE(75  MG) BY MOUTH DAILY WITH BREAKFAST     Psychiatry: Antidepressants - SNRI - desvenlafaxine & venlafaxine Failed - 09/30/2023  1:28 PM      Failed - Cr in normal range and within 360 days    Creatinine, Ser  Date Value Ref Range Status  06/01/2023 1.79 (H) 0.76 - 1.27 mg/dL Final   Creatinine, Urine  Date Value Ref Range Status  03/02/2021 100.20 mg/dL Final         Failed - Lipid Panel in normal range within the last 12 months    Cholesterol, Total  Date Value Ref Range Status  09/22/2023 150 100 - 199 mg/dL Final   LDL Chol Calc (NIH)  Date Value Ref Range Status  09/22/2023 85 0 - 99 mg/dL Final   HDL  Date Value Ref Range Status  09/22/2023 42 >39 mg/dL Final   Triglycerides  Date Value Ref Range Status  09/22/2023 130 0 - 149 mg/dL Final         Passed - Completed PHQ-2 or PHQ-9 in the last 360 days      Passed - Last BP in normal range    BP Readings from Last 1 Encounters:  09/22/23 105/74  Passed - Valid encounter within last 6 months    Recent Outpatient Visits           1 week ago Encounter for [employer] annual wellness exam   [place] [facility] - A Dept Of [place]. [facility] Hoy Register, MD   4 months ago Type 2 diabetes mellitus with other circulatory complication, without long-term current use of insulin (HCC)   [place] [facility] [place] - A Dept Of [place]. [facility] Hoy Register, MD   10 months ago Type 2 diabetes mellitus with other circulatory complication, without long-term current use of insulin (HCC)   [place] [facility] [place] - A Dept Of [place]. [facility] Hoy Register, MD   1 year ago Type 2 diabetes mellitus with other circulatory complication, without long-term current use of insulin (HCC)    [facility] [place] - A Dept Of [place]. [facility] [place], [place], [place]   1 year ago Type 2 diabetes mellitus with other circulatory complication,  without long-term current use of insulin (HCC)    [facility] - A Dept Of . [facility] Hoy Register, MD

## 2023-10-04 ENCOUNTER — Telehealth: Payer: Self-pay

## 2023-10-04 NOTE — Telephone Encounter (Signed)
 Called re: PREP program referral; he thought it was offered in [place], but [place] is too far for him to drive twice a week.

## 2023-10-07 ENCOUNTER — Telehealth (HOSPITAL_COMMUNITY): Payer: Self-pay

## 2023-10-07 NOTE — Telephone Encounter (Signed)
 Called and left patient a voice message to confirm/remind patient of their appointment at the [facility] on 10/08/23.   Patient reminded to bring all medications and/or complete list.  Confirmed patient has transportation. Gave directions, instructed to utilize valet parking.  Confirmed appointment prior to ending call.

## 2023-10-08 ENCOUNTER — Telehealth (HOSPITAL_COMMUNITY): Payer: Self-pay

## 2023-10-08 ENCOUNTER — Ambulatory Visit (HOSPITAL_COMMUNITY)
Admission: RE | Admit: 2023-10-08 | Discharge: 2023-10-08 | Disposition: A | Discharge status: Home / Self Care | Payer: Medicare Other | Source: Ambulatory Visit | Attending: Family Medicine | Admitting: Family Medicine

## 2023-10-08 ENCOUNTER — Other Ambulatory Visit (HOSPITAL_COMMUNITY): Payer: Self-pay

## 2023-10-08 ENCOUNTER — Encounter (HOSPITAL_COMMUNITY): Payer: Self-pay

## 2023-10-08 VITALS — BP 110/82 | HR 88 | Ht 75.0 in | Wt 252.0 lb

## 2023-10-08 DIAGNOSIS — I255 Ischemic cardiomyopathy: Secondary | ICD-10-CM | POA: Insufficient documentation

## 2023-10-08 DIAGNOSIS — Z79899 Other long term (current) drug therapy: Secondary | ICD-10-CM | POA: Diagnosis not present

## 2023-10-08 DIAGNOSIS — I5022 Chronic systolic (congestive) heart failure: Secondary | ICD-10-CM | POA: Diagnosis not present

## 2023-10-08 DIAGNOSIS — Z7982 Long term (current) use of aspirin: Secondary | ICD-10-CM | POA: Insufficient documentation

## 2023-10-08 DIAGNOSIS — N1831 Chronic kidney disease, stage 3a: Secondary | ICD-10-CM

## 2023-10-08 DIAGNOSIS — Z7985 Long-term (current) use of injectable non-insulin antidiabetic drugs: Secondary | ICD-10-CM | POA: Diagnosis not present

## 2023-10-08 DIAGNOSIS — E118 Type 2 diabetes mellitus with unspecified complications: Secondary | ICD-10-CM

## 2023-10-08 DIAGNOSIS — I2510 Atherosclerotic heart disease of native coronary artery without angina pectoris: Secondary | ICD-10-CM | POA: Insufficient documentation

## 2023-10-08 DIAGNOSIS — Z89512 Acquired absence of left leg below knee: Secondary | ICD-10-CM | POA: Insufficient documentation

## 2023-10-08 DIAGNOSIS — I493 Ventricular premature depolarization: Secondary | ICD-10-CM

## 2023-10-08 DIAGNOSIS — I130 Hypertensive heart and chronic kidney disease with heart failure and stage 1 through stage 4 chronic kidney disease, or unspecified chronic kidney disease: Secondary | ICD-10-CM | POA: Insufficient documentation

## 2023-10-08 DIAGNOSIS — I272 Pulmonary hypertension, unspecified: Secondary | ICD-10-CM | POA: Diagnosis not present

## 2023-10-08 DIAGNOSIS — Z951 Presence of aortocoronary bypass graft: Secondary | ICD-10-CM | POA: Insufficient documentation

## 2023-10-08 DIAGNOSIS — E1122 Type 2 diabetes mellitus with diabetic chronic kidney disease: Secondary | ICD-10-CM | POA: Insufficient documentation

## 2023-10-08 DIAGNOSIS — I471 Supraventricular tachycardia, unspecified: Secondary | ICD-10-CM | POA: Insufficient documentation

## 2023-10-08 DIAGNOSIS — Z7984 Long term (current) use of oral hypoglycemic drugs: Secondary | ICD-10-CM | POA: Diagnosis not present

## 2023-10-08 DIAGNOSIS — N183 Chronic kidney disease, stage 3 unspecified: Secondary | ICD-10-CM | POA: Insufficient documentation

## 2023-10-08 DIAGNOSIS — Z89619 Acquired absence of unspecified leg above knee: Secondary | ICD-10-CM

## 2023-10-08 DIAGNOSIS — Z8719 Personal history of other diseases of the digestive system: Secondary | ICD-10-CM

## 2023-10-08 LAB — BASIC METABOLIC PANEL
Anion gap: 10 (ref 5–15)
BUN: 33 mg/dL — ABNORMAL HIGH (ref 6–20)
CO2: 23 mmol/L (ref 22–32)
Calcium: 9.1 mg/dL (ref 8.9–10.3)
Chloride: 108 mmol/L (ref 98–111)
Creatinine, Ser: 2.17 mg/dL — ABNORMAL HIGH (ref 0.61–1.24)
GFR, Estimated: 36 mL/min — ABNORMAL LOW (ref >60)
Glucose, Bld: 124 mg/dL — ABNORMAL HIGH (ref 70–99)
Potassium: 5.5 mmol/L — ABNORMAL HIGH (ref 3.5–5.1)
Sodium: 141 mmol/L (ref 135–145)

## 2023-10-08 LAB — BRAIN NATRIURETIC PEPTIDE: B Natriuretic Peptide: 47.3 pg/mL (ref 0.0–100.0)

## 2023-10-08 MED ORDER — ROSUVASTATIN CALCIUM 40 MG PO TABS: 40.0000 mg | ORAL_TABLET | Freq: Every day | ORAL | 6 refills | Status: DC

## 2023-10-08 MED ORDER — EPLERENONE 25 MG PO TABS: 12.5000 mg | ORAL_TABLET | Freq: Every day | ORAL | Status: DC

## 2023-10-08 NOTE — Patient Instructions (Addendum)
 Thank you for coming in today  If you had labs drawn today, any labs that are abnormal the clinic will call you No news is good news  Medications: Increase Crestor 40 mg 1 tablet daily   Follow up appointments: Your physician recommends that you return for lab work in:  6-8 weeks LFTs/Lipids  Your physician recommends that you schedule a follow-up appointment in:  3 months With Dr. Shirlee Latch with echocardiogram  Your physician has requested that you have an echocardiogram. Echocardiography is a painless test that uses sound waves to create images of your heart. It provides your doctor with information about the size and shape of your heart and how well your heart's chambers and valves are working. This procedure takes approximately one hour. There are no restrictions for this procedure.      Do the following things EVERYDAY: Weigh yourself in the morning before breakfast. Write it down and keep it in a log. Take your medicines as prescribed Eat low salt foods--Limit salt (sodium) to 2000 mg per day.  Stay as active as you can everyday Limit all fluids for the day to less than 2 liters   At the [facility], you and your health needs are our priority. As part of our continuing mission to provide you with exceptional heart care, we have created designated Provider Care Teams. These Care Teams include your primary Cardiologist (physician) and Advanced Practice Providers (APPs- Physician Assistants and Nurse Practitioners) who all work together to provide you with the care you need, when you need it.   You may see any of the following providers on your designated Care Team at your next follow up: Dr Arvilla Meres Dr Marca Ancona Dr. Marcos Eke, NP Robbie Lis, [name] [facility] [place], [name] Brynda Peon, NP Karle Plumber, PharmD   Please be sure to bring in all your medications bottles to every appointment.    Thank you for choosing  [facility]  If you have any questions or concerns before your next appointment please send [place] a message through [place] or call our office at [phone].    TO LEAVE A MESSAGE FOR THE NURSE SELECT OPTION 2, PLEASE LEAVE A MESSAGE INCLUDING: YOUR NAME DATE OF BIRTH CALL BACK NUMBER REASON FOR CALL**this is important as we prioritize the call backs  YOU WILL RECEIVE A CALL BACK THE SAME DAY AS LONG AS YOU CALL BEFORE 4:00 PM

## 2023-10-08 NOTE — Progress Notes (Signed)
 [facility] Note    PCP: Hoy Register, MD Primary Cardiologist: Norman Herrlich, MD  HF Cardiologist: Dr. Shirlee Latch  HPI: [name] is a 53 y.o.with a history CAD, Had PCI 2007 in [place]. 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  He had follow up with Dr Dulce Sellar 09/09/20 and was set up for cath. Cath with multivessel CAD. He was referred to SW due to financial concerns and no payor source. CMRI showed EF 40% and viable myocardium.      Admitted 02/25/2021 with left foot infection complicated by osteomyelitis and necrotizing fasciitis. 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 Hospital course complicated by erosive esophagitis on EGD. Recommendations for protonix 40 mg bid + repeat EGD in 8 weeks. Received multiple transfusions.   Admitted 03/23/21 with A/C CHF exacerbation. Cardiology consulted. CO-OX 55%.  Diuresed with IV lasix + metolazone.  Repeat echo on 03/23/21 with LVEF of 25%. RHC performed 03/26/21 showed preserved cardiac output, mildly elevated PCWP, and mild pulmonary venous hypertension. LHC 03/28/21 showed  minimal change from prior study in 2/22.   After full treatment of foot infection, he was admitted in 10/22 for CABG with LIMA-LAD, SVG-OM1, SVG-OM3. PDA too small to graft.   Underwent L BKA 1/23 due to osteomyelitis and abscess.  Admitted 4/23 with acute on chronic CHF. Diuresed with IV lasix. Echo in 4/23 showed EF 20-25%, RV moderately reduced, mild to moderate MR. GDMT restarted.   Echo 10/23 showed EF 30%, septal hypokinesis with septal-lateral dyssynchrony, mildly decreased RV systolic function, IVC normal.   Seen in ED 05/24 for SVT. Converted with IV amiodarone. Amiodarone increased to 200 mg daily.   S/p ICD 07/24.  Admitted 8/24 after a right femoral neck fracture. He underwent right direct  anterior hip arthroplasty. Digoxin was discontinued, and Coreg increased. At some point eplerenone was stopped.   Today he returns for HF follow up. Overall feeling fine. He is not SOB with activity. Walks with prosthesis, urinates briskly with addition of lasix. Denies palpitations, abnormal bleeding, CP, dizziness, edema, or PND/Orthopnea. Appetite ok. No fever or chills. Weight at home 250 pounds. Taking all medications. Not losing weight on Ozempic. Has a new girlfriend.  Device interrogation (personally reviewed): CorVue stable, no VT                          PMH: 1. GI bleeding: Erosive esophagitis 8/22.  2. CKD stage 3 3. Type 2 diabetes 4. Hyperlipidemia 5. Left foot/leg diabetic infection with necrotizing fasciitis: 8/22, debridements were done and the left 5th metatarsal was amputated.  1/23 had left BKA.  6. Prior smoker 7. H/o PVC, VT: On amiodarone.  8. CAD: LHC in 9/22 with severe 3 vessel disease.  - CABG (10/22) with LIMA-LAD, SVG-OM2, SVG-OM3.  9. Chronic systolic CHF: Ischemic cardiomyopathy.  - Echo (8/22): LVEF of 25%, LV demonstrating global hypokinesis, and LV diastolic parameters consistent with grade II diastolic dysfunction.  - Echo (4/23): EF 20-25%, severe LV dysfunction, RV moderately reduced, RVSP 46.8 mmHg, mild to moderate MR - Echo (10/23): EF 30%, septal hypokinesis with septal-lateral dyssynchrony, mildly decreased RV systolic function, IVC normal.  - Echo (5/24): EF 25-30%, anteroseptal, inferoseptal and inferior HK, RV okay - s/p ICD 7/24  Review of Systems: All systems reviewed and negative except as per HPI.   Current Outpatient Medications  Medication Sig Dispense  Refill   acetaminophen (TYLENOL) 325 MG tablet Take 2 tablets (650 mg total) by mouth every 6 (six) hours as needed for mild pain (or Fever >/= 101). 30 tablet 0   albuterol (VENTOLIN HFA) 108 (90 Base) MCG/ACT inhaler INHALE 2 PUFFS INTO THE LUNGS EVERY 6 HOURS AS NEEDED FOR WHEEZING OR  SHORTNESS OF BREATH 6.7 g 2   amiodarone (PACERONE) 200 MG tablet Take 1 tablet (200 mg total) by mouth daily. 30 tablet 6   ASPIRIN 81 PO Take 1 tablet by mouth daily.     Blood Pressure Monitoring (BLOOD PRESSURE CUFF) MISC 1 each by Does not apply route as directed. 1 each 0   carvedilol (COREG) 6.25 MG tablet Take 1 tablet (6.25 mg total) by mouth 2 (two) times daily with a meal. 60 tablet 6   empagliflozin (JARDIANCE) 10 MG TABS tablet Take 1 tablet (10 mg total) by mouth daily. Please make appointment with Dr. Alvis Lemmings for more refills. 30 tablet 6   eplerenone (INSPRA) 25 MG tablet Take 1 tablet (25 mg total) by mouth daily. 30 tablet 11   furosemide (LASIX) 20 MG tablet Take 1 tablet (20 mg total) by mouth daily. 30 tablet 11   gabapentin (NEURONTIN) 100 MG capsule Take 2 capsules (200 mg total) by mouth at bedtime. 60 capsule 2   glipiZIDE (GLUCOTROL) 5 MG tablet Take 1 tablet (5 mg total) by mouth daily before breakfast. 90 tablet 1   glucose blood (TRUE METRIX BLOOD GLUCOSE TEST) test strip Use as instructed twice daily 100 each 12   rosuvastatin (CRESTOR) 20 MG tablet Take 1 tablet (20 mg total) by mouth daily. 30 tablet 6   Semaglutide, 2 MG/DOSE, 8 MG/3ML SOPN Inject 2 mg as directed once a week. 9 mL 6   TRUEplus Lancets 28G MISC Use as directed twice daily at 8 am and 10 pm. 100 each 11   No current facility-administered medications for this encounter.   Allergies  Allergen Reactions   Morphine Itching    Itching under skin   Social History   Socioeconomic History   Marital status: Single    Spouse name: Not on file   Number of children: 1   Years of education: Not on file   Highest education level: 8th grade  Occupational History   Occupation: none    Comment: former "[employer]"  Tobacco Use   Smoking status: Former    Current packs/day: 0.00    Average packs/day: 3.0 packs/day for 36.0 years (108.0 ttl pk-yrs)    Types: Cigarettes    Start date: 08/27/1984    Quit  date: 08/27/2020    Years since quitting: 3.1   Smokeless tobacco: Never  Vaping Use   Vaping status: Never Used  Substance and Sexual Activity   Alcohol use: Not Currently    Comment: Very rare   Drug use: Not Currently    Types: Marijuana    Comment: smoked marijuana in his youth(teenager)   Sexual activity: Not on file  Other Topics Concern   Not on file  Social History Narrative   Not on file   Social Drivers of Health   Financial Resource Strain: Low Risk  (09/22/2023)   Overall Financial Resource Strain (CARDIA)    Difficulty of Paying Living Expenses: Not hard at all  Food Insecurity: No Food Insecurity (09/22/2023)   Hunger Vital Sign    Worried About Running Out of Food in the Last Year: Never true    Ran Out  of Food in the Last Year: Never true  Transportation Needs: No Transportation Needs (09/22/2023)   PRAPARE - Administrator, Civil Service (Medical): No    Lack of Transportation (Non-Medical): No  Physical Activity: Inactive (09/22/2023)   Exercise Vital Sign    Days of Exercise per Week: 0 days    Minutes of Exercise per Session: 0 min  Stress: No Stress Concern Present (09/22/2023)   [employer] of Occupational Health - Occupational Stress Questionnaire    Feeling of Stress : Not at all  Social Connections: Socially Isolated (09/22/2023)   Social Connection and Isolation Panel [NHANES]    Frequency of Communication with Friends and Family: Three times a week    Frequency of Social Gatherings with Friends and Family: Once a week    Attends Religious Services: Never    Database administrator or Organizations: No    Attends Banker Meetings: Never    Marital Status: Never married  Intimate Partner Violence: Not At Risk (09/22/2023)   Humiliation, Afraid, Rape, and Kick questionnaire    Fear of Current or Ex-Partner: No    Emotionally Abused: No    Physically Abused: No    Sexually Abused: No   Family History  Problem Relation Age  of Onset   Hypertension Mother    Heart failure Father    Hypertension Brother    Congestive Heart Failure Brother    BP 110/82   Pulse 88   Ht 6\' 3"  (1.905 m)   Wt 114.3 kg (252 lb)   SpO2 95%   BMI 31.50 kg/m   Wt Readings from Last 3 Encounters:  10/08/23 114.3 kg (252 lb)  09/22/23 115.3 kg (254 lb 3.2 oz)  06/07/23 115.7 kg (255 lb)   PHYSICAL EXAM: General:  NAD. No resp difficulty, walked into clinic HEENT: Normal Neck: Supple. No JVD. Cor: Regular rate & rhythm. No rubs, gallops or murmurs. Lungs: Clear Abdomen: Soft, nontender, nondistended.  Extremities: No cyanosis, clubbing, rash, edema; LLE prosthesis Neuro: Alert & oriented x 3, moves all 4 extremities w/o difficulty. Affect pleasant.  ASSESSMENT & PLAN: 1. Chronic systolic CHF: Ischemic cardiomyopathy.  Initial echo in 2/22 with EF 40-45%.  Echo in 7/22 with EF 30-35%.  Echo 8/22 EF down to 25% with mild RV dysfunction and dilated IVC.  RHC 8/22 with preserved CO, mildly elevated PCWP, mild pulmonary hypertension. S/p CABG 10/22. Readmitted 4/23 with CHF, had been off diuretics and missed follow up. Echo in 4/23 with EF 20-25%, RV moderately reduced, mild to moderate MR. Echo 10/23 EF 30%, septal hypokinesis with septal-lateral dyssynchrony, mildly decreased RV systolic function, IVC normal.  Echo 5/24: EF 25-30%, RV okay. NYHA I-II, he is not volume overloaded on exam or by CorVue. - Continue Lasix 20 mg daily. BMET and BNP today. - Continue Jardiance 10 mg daily. No GU symptoms.  - Continue Coreg 6.25 mg bid. - Continue eplerenone 25 mg daily. - No BP room to add ARB/ARNI.  - update echo next visit. 2. CAD: Hx PCI 2007. S/p CABG x 3 with LIMA-LAD, SVG-OM2, and SVG-OM3 10/22. No chest pain.  - Continue  ASA 81 daily.  - With LDL 85 (2/25), increase rosuvastatin to 40 mg daily. LFTs/lipids in 6-8 weeks. 3. PVCs/VT: Noted 10/22 post CABG. Denies palpitations. - Continue amiodarone 200 mg daily.  Recent TSH and  LFTs stable, he will need regular eye exam.  4. SVT: Episode 5/24. Converted with IV amiodarone. -  Continue amiodarone 200 mg daily.  5. CKD stage 3: Scr baseline 1.6-1.8. Continue SGLT2i - BMET today. 6. GI bleeding: History of erosive esophagitis in 8/22.   - No change. 7. Left leg diabetic foot infection: Underwent left BKA 1/23. And revision of L BKA 10/23. Left stump now healed.  - Has prosthesis. 8. DM2:  Most recent Hgb A1C 8.1. PCP managing. - On Ozempic. ? If switch to tirzepatide would result in more meaningful weight loss. Defer to PCP.  Follow up in 3 months with Dr. Shirlee Latch + echo  [facility] FNP-BC 10/08/23

## 2023-10-08 NOTE — Telephone Encounter (Addendum)
 Pt aware, agreeable, and verbalized understanding  Labs ordered and med list updated   ----- Message from Jacklynn Ganong sent at 10/08/2023  4:17 PM EDT ----- K is elevated.  Hold Inspra x 3 days, then resume at lower dose of 12.5 mg daily.  Repeat BMET in 1 week

## 2023-10-14 NOTE — Progress Notes (Signed)
 Remote ICD transmission.

## 2023-10-15 ENCOUNTER — Ambulatory Visit
Admission: RE | Admit: 2023-10-15 | Discharge: 2023-10-15 | Disposition: A | Discharge status: Home / Self Care | Payer: Medicare Other | Source: Ambulatory Visit | Attending: Family Medicine | Admitting: Family Medicine

## 2023-10-15 DIAGNOSIS — F17210 Nicotine dependence, cigarettes, uncomplicated: Secondary | ICD-10-CM

## 2023-10-15 LAB — BASIC METABOLIC PANEL
BUN/Creatinine Ratio: 18 (ref 9–20)
BUN: 34 mg/dL — ABNORMAL HIGH (ref 6–24)
CO2: 20 mmol/L (ref 20–29)
Calcium: 9.3 mg/dL (ref 8.7–10.2)
Chloride: 106 mmol/L (ref 96–106)
Creatinine, Ser: 1.93 mg/dL — ABNORMAL HIGH (ref 0.76–1.27)
Glucose: 82 mg/dL (ref 70–99)
Potassium: 4.7 mmol/L (ref 3.5–5.2)
Sodium: 141 mmol/L (ref 134–144)
eGFR: 41 mL/min/{1.73_m2} — ABNORMAL LOW (ref >59)

## 2023-10-20 ENCOUNTER — Encounter: Payer: Self-pay | Admitting: Cardiology

## 2023-10-21 ENCOUNTER — Encounter: Payer: Self-pay | Admitting: Physician Assistant

## 2023-10-21 ENCOUNTER — Ambulatory Visit (INDEPENDENT_AMBULATORY_CARE_PROVIDER_SITE_OTHER): Admitting: Physician Assistant

## 2023-10-21 ENCOUNTER — Other Ambulatory Visit (INDEPENDENT_AMBULATORY_CARE_PROVIDER_SITE_OTHER): Payer: Self-pay

## 2023-10-21 DIAGNOSIS — Z89512 Acquired absence of left leg below knee: Secondary | ICD-10-CM

## 2023-10-21 DIAGNOSIS — Z96641 Presence of right artificial hip joint: Secondary | ICD-10-CM

## 2023-10-21 NOTE — Progress Notes (Signed)
 HPI: Adam James comes in today status post right total hip arthroplasty 03/20/2023.  He states he has some bumps under his incision scar from the total hip arthroplasty.  He also has some soreness with external rotation of the right hip but no groin pain.  Pains may sleep the lateral aspect of the hip.  Patient is also status post left leg BKA a by Dr. Levonne Spiller and is wanting [place] to look at his stump.  He has had no fevers chills.  No injuries.  Review of systems see HPI otherwise negative  Physical exam: General Well-developed well-nourished male in no acute distress ambulates without a cane or crutch. Psych: Alert and oriented x 3 Right hip: Surgical incisions healing well some areas of scar tissue but no signs of infection or wound dehiscence.  Right hip excellent range of motion without pain.  Abduction however causes discomfort the lateral aspect of the hip and he has tenderness over the trochanteric region down the IT band consistent with IT band syndrome.  Hip flexion on the right causes no pain. Left leg: Surgical incision from the below-knee amputation is healed well.  Just proximal to the incision over the tibial spine he has an area of slight erythema but no skin breakdown due to contact with his prosthesis.  Good range of motion in the knee.  Radiographs: AP pelvis: Bilateral hips well located.  Right total hip arthroplasty with well-seated components.  Some bony ingrowth at the distal end of the stem is seen.  No acute fractures.  No bony abnormalities.  Impression: Status post right total hip arthroplasty secondary to femoral neck fracture History of left below-knee amputation.  Plan: He is shown IT band stretching exercises.  Scar tissue mobilization is encouraged.  In regards to his BKA recommend that he be seen at [facility] to see if they can offload the tibial spine.  Patient states that he will get an appointment with [facility] as soon as possible.  Questions were encouraged and answered at  length.  He will follow-up with [place] at 1 year postop for an AP pelvis.  Sooner if there is any questions concerns.

## 2023-10-27 ENCOUNTER — Other Ambulatory Visit: Payer: Self-pay

## 2023-10-28 ENCOUNTER — Telehealth: Payer: Self-pay

## 2023-10-28 ENCOUNTER — Other Ambulatory Visit: Payer: Self-pay

## 2023-10-28 NOTE — Telephone Encounter (Signed)
 Pharmacy Patient Advocate Encounter  Received notification from  [facility] MEDICARE D  that Prior Authorization for [facility] has been APPROVED from 10/27/2023 to 07/26/2024   PA #/Case ID/Reference #: [id number]

## 2023-11-08 ENCOUNTER — Ambulatory Visit: Payer: Medicare Other | Admitting: Family Medicine

## 2023-11-18 ENCOUNTER — Ambulatory Visit (HOSPITAL_COMMUNITY)
Admission: RE | Admit: 2023-11-18 | Discharge: 2023-11-18 | Disposition: A | Discharge status: Home / Self Care | Source: Ambulatory Visit | Attending: Cardiology | Admitting: Cardiology

## 2023-11-18 ENCOUNTER — Other Ambulatory Visit: Payer: Self-pay | Admitting: Family Medicine

## 2023-11-18 DIAGNOSIS — I5022 Chronic systolic (congestive) heart failure: Secondary | ICD-10-CM | POA: Insufficient documentation

## 2023-11-18 DIAGNOSIS — E1159 Type 2 diabetes mellitus with other circulatory complications: Secondary | ICD-10-CM

## 2023-11-18 LAB — HEPATIC FUNCTION PANEL
ALT: 19 U/L (ref 0–44)
AST: 19 U/L (ref 15–41)
Albumin: 3.7 g/dL (ref 3.5–5.0)
Alkaline Phosphatase: 80 U/L (ref 38–126)
Bilirubin, Direct: <0.1 mg/dL (ref 0.0–0.2)
Total Bilirubin: 0.7 mg/dL (ref 0.0–1.2)
Total Protein: 7 g/dL (ref 6.5–8.1)

## 2023-11-18 LAB — LIPID PANEL
Cholesterol: 135 mg/dL (ref 0–200)
HDL: 37 mg/dL — ABNORMAL LOW (ref >40)
LDL Cholesterol: 75 mg/dL (ref 0–99)
Total CHOL/HDL Ratio: 3.6 ratio
Triglycerides: 113 mg/dL (ref <150)
VLDL: 23 mg/dL (ref 0–40)

## 2023-11-22 ENCOUNTER — Other Ambulatory Visit: Payer: Self-pay | Admitting: Family Medicine

## 2023-11-23 NOTE — Telephone Encounter (Signed)
 Requested Prescriptions  Pending Prescriptions Disp Refills   gabapentin  (NEURONTIN ) 100 MG capsule [Pharmacy Med Name: GABAPENTIN  100MG  CAPSULES] 180 capsule 0    Sig: TAKE 2 CAPSULES(200 MG) BY MOUTH AT BEDTIME     Neurology: Anticonvulsants - gabapentin  Failed - 11/23/2023  4:25 PM      Failed - Cr in normal range and within 360 days    Creatinine, Ser  Date Value Ref Range Status  10/14/2023 1.93 (H) 0.76 - 1.27 mg/dL Final   Creatinine, Urine  Date Value Ref Range Status  03/02/2021 100.20 mg/dL Final         Passed - Completed PHQ-2 or PHQ-9 in the last 360 days      Passed - Valid encounter within last 12 months    Recent Outpatient Visits           2 months ago Encounter for [employer] annual wellness exam   [place] [facility] - A Dept Of [place]. [facility] Joaquin Mulberry, MD   5 months ago Type 2 diabetes mellitus with other circulatory complication, without long-term current use of insulin  [facility])   [place] [facility] - A Dept Of [place]. [facility] Joaquin Mulberry, MD   11 months ago Type 2 diabetes mellitus with other circulatory complication, without long-term current use of insulin  [facility])   [place] [facility] - A Dept Of [place]. [facility] Joaquin Mulberry, MD   1 year ago Type 2 diabetes mellitus with other circulatory complication, without long-term current use of insulin  [facility])   [place] [facility] - A Dept Of [place]. [facility] [place], [place], [place]   1 year ago Type 2 diabetes mellitus with other circulatory complication, without long-term current use of insulin  [facility])   [place] [facility] [place] - A Dept Of [place]. [facility] Joaquin Mulberry, MD       Future Appointments             In 4 months [place] Job Jenifer Miu, PA-C [facility]

## 2023-11-25 ENCOUNTER — Telehealth: Payer: Self-pay | Admitting: Family Medicine

## 2023-11-25 NOTE — Telephone Encounter (Signed)
 Copied from CRM [id number]. Topic: Clinical - Lab/Test Results  >> Nov 25, 2023 12:10 PM [place] B wrote: Reason for CRM: pt called about xray results of chest and lungs

## 2023-11-25 NOTE — Telephone Encounter (Signed)
 Attempted to contact patient and no answer and no vm set up to leave a message.

## 2023-11-29 ENCOUNTER — Other Ambulatory Visit (HOSPITAL_COMMUNITY): Payer: Self-pay | Admitting: Family Medicine

## 2023-11-30 NOTE — Telephone Encounter (Signed)
 Copied from CRM [id number]. Topic: Clinical - Lab/Test Results  >> Nov 30, 2023  2:50 PM Georgeann Kindred wrote: Reason for CRM: Patient returning call regarding Xray results. Please contact patient at [phone].

## 2023-11-30 NOTE — Telephone Encounter (Signed)
 Patient has been informed of CT scan results.

## 2023-12-01 ENCOUNTER — Ambulatory Visit: Attending: Family Medicine | Admitting: Family Medicine

## 2023-12-01 ENCOUNTER — Encounter: Payer: Self-pay | Admitting: Family Medicine

## 2023-12-01 VITALS — BP 96/65 | HR 89 | Ht 75.0 in | Wt 250.2 lb

## 2023-12-01 DIAGNOSIS — G548 Other nerve root and plexus disorders: Secondary | ICD-10-CM

## 2023-12-01 DIAGNOSIS — E1159 Type 2 diabetes mellitus with other circulatory complications: Secondary | ICD-10-CM

## 2023-12-01 DIAGNOSIS — Z951 Presence of aortocoronary bypass graft: Secondary | ICD-10-CM

## 2023-12-01 DIAGNOSIS — Z7984 Long term (current) use of oral hypoglycemic drugs: Secondary | ICD-10-CM

## 2023-12-01 DIAGNOSIS — G546 Phantom limb syndrome with pain: Secondary | ICD-10-CM | POA: Diagnosis not present

## 2023-12-01 DIAGNOSIS — I5043 Acute on chronic combined systolic (congestive) and diastolic (congestive) heart failure: Secondary | ICD-10-CM | POA: Diagnosis not present

## 2023-12-01 DIAGNOSIS — E1122 Type 2 diabetes mellitus with diabetic chronic kidney disease: Secondary | ICD-10-CM

## 2023-12-01 DIAGNOSIS — Z7985 Long-term (current) use of injectable non-insulin antidiabetic drugs: Secondary | ICD-10-CM | POA: Diagnosis not present

## 2023-12-01 DIAGNOSIS — M720 Palmar fascial fibromatosis [Dupuytren]: Secondary | ICD-10-CM | POA: Diagnosis not present

## 2023-12-01 DIAGNOSIS — I10 Essential (primary) hypertension: Secondary | ICD-10-CM

## 2023-12-01 DIAGNOSIS — E785 Hyperlipidemia, unspecified: Secondary | ICD-10-CM

## 2023-12-01 DIAGNOSIS — J438 Other emphysema: Secondary | ICD-10-CM

## 2023-12-01 DIAGNOSIS — E119 Type 2 diabetes mellitus without complications: Secondary | ICD-10-CM | POA: Diagnosis not present

## 2023-12-01 LAB — POCT GLYCOSYLATED HEMOGLOBIN (HGB A1C): HbA1c, POC (controlled diabetic range): 6.6 % (ref 0.0–7.0)

## 2023-12-01 MED ORDER — GLIPIZIDE ER 2.5 MG PO TB24: 2.5000 mg | ORAL_TABLET | Freq: Every day | ORAL | 1 refills | Status: DC

## 2023-12-01 MED ORDER — ONETOUCH VERIO W/DEVICE KIT: PACK | 0 refills | Status: AC

## 2023-12-01 MED ORDER — GABAPENTIN 300 MG PO CAPS: 300.0000 mg | ORAL_CAPSULE | Freq: Every day | ORAL | 1 refills | Status: DC

## 2023-12-01 MED ORDER — ONETOUCH VERIO VI STRP: ORAL_STRIP | 12 refills | Status: AC

## 2023-12-01 MED ORDER — BLOOD PRESSURE CUFF MISC: 1.0000 | 0 refills | Status: AC

## 2023-12-01 MED ORDER — TIRZEPATIDE 12.5 MG/0.5ML ~~LOC~~ SOAJ: 12.5000 mg | SUBCUTANEOUS | 6 refills | Status: DC

## 2023-12-01 MED ORDER — ONETOUCH DELICA PLUS LANCET33G MISC: 3 refills | Status: AC

## 2023-12-01 NOTE — Patient Instructions (Signed)
 VISIT SUMMARY:  You came in today for a follow-up visit to manage your diabetes and coronary artery disease. We discussed your current medications, symptoms, and made some adjustments to better control your conditions and improve your overall health.  YOUR PLAN:  -HYPERTENSION: Hypertension is high blood pressure. Your blood pressure is slightly low, and you are experiencing dizziness when standing. We will notify the cardiology clinic to consider adjusting your medication.  -HEART FAILURE: Heart failure means your heart is not pumping blood as well as it should. You are scheduled for an echocardiogram to assess your heart's function. Continue using your albuterol  inhaler as needed for wheezing.  -TYPE 2 DIABETES MELLITUS: Type 2 diabetes is a condition where your body does not use insulin  properly. Your A1c is 6.6, indicating good control. We are switching you from Ozempic  to Mounjaro 12.5 mg weekly for better weight management and reducing your glipizide  to 2.5 mg daily to prevent low blood sugar. Continue taking empagliflozin  (Jardiance ).  -HYPERLIPIDEMIA: Hyperlipidemia is high cholesterol. You have cholesterol deposits in your aorta. Continue taking rosuvastatin  to manage your cholesterol levels.  -CORONARY ARTERY DISEASE POST-BYPASS GRAFTING: Coronary artery disease is a condition where the blood vessels supplying your heart are narrowed or blocked. You have had bypass surgery and have cholesterol deposits in your aorta. Continue taking rosuvastatin  to manage your cholesterol levels.  -EMPHYSEMA: Emphysema is a lung condition that causes shortness of breath. You are currently asymptomatic but use an inhaler during colds to help with wheezing and congestion.  -PHANTOM LIMB PAIN: Phantom limb pain is pain that feels like it's coming from a body part that's no longer there. You experience this in your left leg. We are increasing your gabapentin  to 300 mg at bedtime to help manage this  pain.  INSTRUCTIONS:  1. Notify the cardiology clinic about your dizziness and low blood pressure for potential medication adjustment. 2. Undergo the scheduled echocardiogram to assess your heart's ejection fraction. 3. Discontinue Ozempic  and start Mounjaro 12.5 mg once a week. 4. Reduce glipizide  to 2.5 mg daily. 5. Continue taking empagliflozin  (Jardiance ) and rosuvastatin  as prescribed. 6. Increase gabapentin  to 300 mg at bedtime and pick up the new prescription from the pharmacy.

## 2023-12-01 NOTE — Progress Notes (Signed)
 Subjective:  Patient ID: Adam James, male    DOB: 06-14-1971  Age: 53 y.o. MRN: [id number]  CC: Medical Management of Chronic Issues (Discuss CT scan results/Left pinky finger concern.)     Discussed the use of AI scribe software for clinical note transcription with the patient, who gave verbal consent to proceed.  History of Present Illness Adam James is a 53 year old male with a history of HFrEF (EF 25 to 30% from echo on 11/2022 status post ICD), CABG (in 04/2022), SVT, type 2 diabetes mellitus (A1c 8.1), stage III CKD, left BKA (in 07/2021), depression, nicotine dependence (quit in 2022), Right clavicular fracture, right Hip fracture after a fall (status post THA in 02/2023)  who presents for follow-up and medication management.  His diabetes is well-controlled with a recent A1c of 6.6. He is on glipizide  5 mg daily, which is being adjusted to 2.5 mg, and is interested in additional weight loss. He has gained weight from 197 to 250 pounds and is interested in weight loss.  He experiences phantom pain in his left leg, described as 'achy' and requiring movement for relief. Gabapentin , previously reduced to 200 mg, is now increased to 300 mg at bedtime to manage this pain.  He has coronary artery disease with a history of triple bypass surgery. He experiences occasional dizziness, particularly when standing, and is awaiting an echocardiogram to assess his ejection fraction. There is an issue with digoxin  not being approved by his pharmacy.  He has a history of smoking and emphysema, but denies typical symptoms. He uses an inhaler during colds to alleviate wheezing and congestion. He Complains of a contracture in his left fifth finger, limiting movement.    Past Medical History:  Diagnosis Date   Acute on chronic systolic CHF (congestive heart failure) (HCC) 11/04/2021   AICD (automatic cardioverter/defibrillator) present    Anemia    GI bleed; 03/13/11 EGD: relux esophagitis  with ulceration/clot   CAD (coronary artery disease)    Cardiomyopathy (HCC)    Chronic HFrEF (heart failure with reduced ejection fraction) (HCC)    CKD stage 3a, GFR 45-59 ml/min (HCC)    Dehiscence of amputation stump of left lower extremity (HCC) 05/06/2022   Depression    Diabetic foot ulcer (HCC)    Dyslipidemia    Dyspnea    08/05/21- not recently.   Hypertension    Myocardial infarction (HCC)    Necrotizing fasciitis (HCC)    NSTEMI (non-ST elevated myocardial infarction) (HCC)    PVC's (premature ventricular contractions)    Type 2 diabetes mellitus (HCC)    [facility])     Past Surgical History:  Procedure Laterality Date   AMPUTATION Left 04/30/2021   Procedure: LEFT FOOT 5TH RAY AMPUTATION APPLICATION OF A-CELL POWDER;  Surgeon: Timothy Ford, MD;  Location: [facility] OR;  Service: Orthopedics;  Laterality: Left;   AMPUTATION Left 07/30/2021   Procedure: LEFT BELOW KNEE AMPUTATION;  Surgeon: Timothy Ford, MD;  Location: [facility] OR;  Service: Orthopedics;  Laterality: Left;   APPENDECTOMY     APPLICATION OF WOUND VAC Left 03/03/2021   Procedure: APPLICATION OF WOUND VAC;  Surgeon: Wes Hamman, MD;  Location: [facility] OR;  Service: Orthopedics;  Laterality: Left;   APPLICATION OF WOUND VAC  03/07/2021   Procedure: APPLICATION OF WOUND VAC;  Surgeon: Timothy Ford, MD;  Location: [facility] OR;  Service: Orthopedics;;   APPLICATION OF WOUND VAC  07/30/2021   Procedure: APPLICATION OF WOUND VAC;  Surgeon: Timothy Ford, MD;  Location: [facility] OR;  Service: Orthopedics;;   CARDIAC CATHETERIZATION     COLON SURGERY     CORONARY ARTERY BYPASS GRAFT N/A 05/22/2021   Procedure: CORONARY ARTERY BYPASS GRAFTING (CABG) X 3 USING LEFT INTERNAL MAMMARY ARTERY AND RIGHT ENDOSCOPIC GREATER SAPHENOUS VEIN CONDUITS;  Surgeon: Hilarie Lovely, MD;  Location: [facility] OR;  Service: Open Heart Surgery;  Laterality: N/A;   ENDOVEIN HARVEST OF GREATER SAPHENOUS VEIN Right 05/22/2021   Procedure: ENDOVEIN HARVEST OF  GREATER SAPHENOUS VEIN;  Surgeon: Hilarie Lovely, MD;  [facility]: [facility] OR;  Service: Open Heart Surgery;  Laterality: Right;   ESOPHAGOGASTRODUODENOSCOPY (EGD) WITH PROPOFOL  N/A 03/12/2021   Procedure: ESOPHAGOGASTRODUODENOSCOPY (EGD) WITH PROPOFOL ;  Surgeon: Elois Hair, MD;  Location: [facility] ENDOSCOPY;  Service: Gastroenterology;  Laterality: N/A;   I & D EXTREMITY Left 02/24/2021   Procedure: IRRIGATION AND DEBRIDEMENT EXTREMITY;  Surgeon: Hardy Lia, MD;  Location: [facility] OR;  Service: Orthopedics;  Laterality: Left;   I & D EXTREMITY Left 02/26/2021   Procedure: IRRIGATION AND DEBRIDEMENT OF LEG;  Surgeon: Timothy Ford, MD;  Location: [facility] OR;  Service: Orthopedics;  Laterality: Left;   I & D EXTREMITY Left 03/03/2021   Procedure: LEFT LEG IRRIGATION AND DEBRIDEMENT;  Surgeon: Wes Hamman, MD;  [facility] OR;  Service: Orthopedics;  Laterality: Left;   I & D EXTREMITY Left 03/07/2021   Procedure: REPEAT DEBRIDEMENT LEFT LEG;  Surgeon: Timothy Ford, MD;  Location: [facility] OR;  Service: Orthopedics;  Laterality: Left;   ICD IMPLANT N/A 02/24/2023   Procedure: ICD IMPLANT;  Surgeon: Verona Goodwill, MD;  Location[facility] INVASIVE CV LAB;  Service: Cardiovascular;  Laterality: N/A;   IR FLUORO GUIDE CV LINE RIGHT  03/10/2021   IR REMOVAL TUN CV CATH W/O FL  03/24/2021   IR US  GUIDE VASC ACCESS RIGHT  03/10/2021   LEFT HEART CATH AND CORONARY ANGIOGRAPHY N/A 09/13/2020   Procedure: LEFT HEART CATH AND CORONARY ANGIOGRAPHY;  Surgeon: Millicent Ally, MD;  [facility] INVASIVE CV LAB;  Service: Cardiovascular;  Laterality: N/A;   LEFT HEART CATH AND CORONARY ANGIOGRAPHY N/A 03/28/2021   Procedure: LEFT HEART CATH AND CORONARY ANGIOGRAPHY;  Surgeon: Darlis Eisenmenger, MD;  Location: [facility] INVASIVE CV LAB;  Service: Cardiovascular;  Laterality: N/A;   RIGHT HEART CATH N/A 03/26/2021   Procedure: RIGHT HEART CATH;  Surgeon: Darlis Eisenmenger, MD;  Location: [facility] INVASIVE CV LAB;  Service: Cardiovascular;  Laterality:  N/A;   STUMP REVISION Left 05/06/2022   Procedure: REVISION LEFT BELOW KNEE AMPUTATION;  Surgeon: Timothy Ford, MD;  Location: [facility] OR;  Service: Orthopedics;  Laterality: Left;   TEE WITHOUT CARDIOVERSION N/A 05/22/2021   Procedure: TRANSESOPHAGEAL ECHOCARDIOGRAM (TEE);  Surgeon: Hilarie Lovely, MD;  Location: [facility] OR;  Service: Open Heart Surgery;  Laterality: N/A;   TOE AMPUTATION  2020   TOTAL HIP ARTHROPLASTY Right 03/20/2023   Procedure: TOTAL HIP ARTHROPLASTY ANTERIOR APPROACH;  Surgeon: Arnie Lao, MD;  Location[facility] OR;  Service: Orthopedics;  Laterality: Right;    Family History  Problem Relation Age of Onset   Hypertension Mother    Heart failure Father    Hypertension Brother    Congestive Heart Failure Brother     Social History   Socioeconomic History   Marital status: Single    Spouse name: Not on file   Number of children: 1   Years of education: Not on file  Highest education level: 8th grade  Occupational History   Occupation: none    Comment: former "[employer]"  Tobacco Use   Smoking status: Former    Current packs/day: 0.00    Average packs/day: 3.0 packs/day for 36.0 years (108.0 ttl pk-yrs)    Types: Cigarettes    Start date: 08/27/1984    Quit date: 08/27/2020    Years since quitting: 3.2   Smokeless tobacco: Never  Vaping Use   Vaping status: Never Used  Substance and Sexual Activity   Alcohol use: Not Currently    Comment: Very rare   Drug use: Not Currently    Types: Marijuana    Comment: smoked marijuana in his youth(teenager)   Sexual activity: Not on file  Other Topics Concern   Not on file  Social History Narrative   Not on file   Social Drivers of Health   Financial Resource Strain: Low Risk  (09/22/2023)   Overall Financial Resource Strain (CARDIA)    Difficulty of Paying Living Expenses: Not hard at all  Food Insecurity: No Food Insecurity (09/22/2023)   Hunger Vital Sign    Worried About Running Out of Food in the  Last Year: Never true    Ran Out of Food in the Last Year: Never true  Transportation Needs: No Transportation Needs (09/22/2023)   PRAPARE - Administrator, Civil Service (Medical): No    Lack of Transportation (Non-Medical): No  Physical Activity: Inactive (09/22/2023)   Exercise Vital Sign    Days of Exercise per Week: 0 days    Minutes of Exercise per Session: 0 min  Stress: No Stress Concern Present (09/22/2023)   [employer] of Occupational Health - Occupational Stress Questionnaire    Feeling of Stress : Not at all  Social Connections: Socially Isolated (09/22/2023)   Social Connection and Isolation Panel [NHANES]    Frequency of Communication with Friends and Family: Three times a week    Frequency of Social Gatherings with Friends and Family: Once a week    Attends Religious Services: Never    Database administrator or Organizations: No    Attends Engineer, structural: Never    Marital Status: Never married    Allergies  Allergen Reactions   Morphine Itching    Itching under skin    Outpatient Medications Prior to Visit  Medication Sig Dispense Refill   albuterol  (VENTOLIN  HFA) 108 (90 Base) MCG/ACT inhaler INHALE 2 PUFFS INTO THE LUNGS EVERY 6 HOURS AS NEEDED FOR WHEEZING OR SHORTNESS OF BREATH 6.7 g 2   amiodarone  (PACERONE ) 200 MG tablet Take 1 tablet (200 mg total) by mouth daily. 30 tablet 6   ASPIRIN  81 PO Take 1 tablet by mouth daily.     Blood Pressure Monitoring (BLOOD PRESSURE CUFF) MISC 1 each by Does not apply route as directed. 1 each 0   carvedilol  (COREG ) 6.25 MG tablet Take 1 tablet (6.25 mg total) by mouth 2 (two) times daily with a meal. 60 tablet 6   empagliflozin  (JARDIANCE ) 10 MG TABS tablet Take 1 tablet (10 mg total) by mouth daily. Please make appointment with Dr. Yolette Hastings for more refills. 30 tablet 6   eplerenone  (INSPRA ) 25 MG tablet Take 0.5 tablets (12.5 mg total) by mouth daily.     furosemide  (LASIX ) 20 MG tablet Take 1  tablet (20 mg total) by mouth daily. 30 tablet 11   gabapentin  (NEURONTIN ) 100 MG capsule TAKE 2 CAPSULES(200 MG) BY MOUTH AT  BEDTIME 180 capsule 0   glipiZIDE  (GLUCOTROL ) 5 MG tablet TAKE 1 TABLET(5 MG) BY MOUTH DAILY BEFORE BREAKFAST 90 tablet 0   glucose blood (TRUE METRIX BLOOD GLUCOSE TEST) test strip Use as instructed twice daily 100 each 12   rosuvastatin  (CRESTOR ) 40 MG tablet Take 1 tablet (40 mg total) by mouth daily. 30 tablet 6   Semaglutide , 2 MG/DOSE, 8 MG/3ML SOPN Inject 2 mg as directed once a week. 9 mL 6   TRUEplus Lancets 28G MISC Use as directed twice daily at 8 am and 10 pm. 100 each 11   acetaminophen  (TYLENOL ) 325 MG tablet Take 2 tablets (650 mg total) by mouth every 6 (six) hours as needed for mild pain (or Fever >/= 101). (Patient not taking: Reported on 12/01/2023) 30 tablet 0   No facility-administered medications prior to visit.     ROS Review of Systems  Constitutional:  Negative for activity change and appetite change.  HENT:  Negative for sinus pressure and sore throat.   Respiratory:  Negative for chest tightness, shortness of breath and wheezing.   Cardiovascular:  Negative for chest pain and palpitations.  Gastrointestinal:  Negative for abdominal distention, abdominal pain and constipation.  Genitourinary: Negative.   Musculoskeletal:        See HPI  Psychiatric/Behavioral:  Negative for behavioral problems and dysphoric mood.     Objective:  BP 96/65   Pulse 89   Ht 6\' 3"  (1.905 m)   Wt 250 lb 3.2 oz (113.5 kg)   SpO2 96%   BMI 31.27 kg/m      12/01/2023    2:13 PM 10/08/2023    2:45 PM 09/22/2023    2:05 PM  BP/Weight  Systolic BP 96 110 105  Diastolic BP 65 82 74  Wt. (Lbs) 250.2 252 254.2  BMI 31.27 kg/m2 31.5 kg/m2 31.77 kg/m2      Physical Exam Constitutional:      Appearance: He is well-developed.  Cardiovascular:     Rate and Rhythm: Normal rate.     Heart sounds: Normal heart sounds. No murmur heard.    Comments: ICD in  left upper thoracic region Pulmonary:     Effort: Pulmonary effort is normal.     Breath sounds: Normal breath sounds. No wheezing or rales.  Chest:     Chest wall: No tenderness.  Abdominal:     General: Bowel sounds are normal. There is no distension.     Palpations: Abdomen is soft. There is no mass.     Tenderness: There is no abdominal tenderness.  Musculoskeletal:     Right lower leg: No edema.     Left lower leg: No edema.     Comments: Left BKA Flexion deformity of left fifth finger with contracture  Neurological:     Mental Status: He is alert and oriented to person, place, and time.  Psychiatric:        Mood and Affect: Mood normal.        Latest Ref Rng & Units 11/18/2023    5:00 PM 10/14/2023   11:04 AM 10/08/2023    3:20 PM  CMP  Glucose 70 - 99 mg/dL  82  409   BUN 6 - 24 mg/dL  34  33   Creatinine 8.11 - 1.27 mg/dL  9.14  7.82   Sodium 956 - 144 mmol/L  141  141   Potassium 3.5 - 5.2 mmol/L  4.7  5.5   Chloride 96 - 106 mmol/L  106  108   CO2 20 - 29 mmol/L  20  23   Calcium  8.7 - 10.2 mg/dL  9.3  9.1   Total Protein 6.5 - 8.1 g/dL 7.0     Total Bilirubin 0.0 - 1.2 mg/dL 0.7     Alkaline Phos 38 - 126 U/L 80     AST 15 - 41 U/L 19     ALT 0 - 44 U/L 19       Lipid Panel     Component Value Date/Time   CHOL 135 11/18/2023 1700   CHOL 150 09/22/2023 1529   TRIG 113 11/18/2023 1700   HDL 37 (L) 11/18/2023 1700   HDL 42 09/22/2023 1529   CHOLHDL 3.6 11/18/2023 1700   VLDL 23 11/18/2023 1700   LDLCALC 75 11/18/2023 1700   LDLCALC 85 09/22/2023 1529    CBC    Component Value Date/Time   WBC 11.8 (H) 03/22/2023 0444   RBC 3.69 (L) 03/22/2023 0444   HGB 10.9 (L) 03/22/2023 0444   HGB 13.2 12/08/2021 1509   HCT 33.1 (L) 03/22/2023 0444   HCT 38.0 12/08/2021 1509   PLT 260 03/22/2023 0444   PLT 261 12/08/2021 1509   MCV 89.7 03/22/2023 0444   MCV 82 12/08/2021 1509   MCH 29.5 03/22/2023 0444   MCHC 32.9 03/22/2023 0444   RDW 13.2 03/22/2023  0444   RDW 14.2 12/08/2021 1509   LYMPHSABS 2.3 03/19/2023 1516   LYMPHSABS 3.6 (H) 12/08/2021 1509   MONOABS 0.6 03/19/2023 1516   EOSABS 0.2 03/19/2023 1516   EOSABS 0.2 12/08/2021 1509   BASOSABS 0.1 03/19/2023 1516   BASOSABS 0.1 12/08/2021 1509    Lab Results  Component Value Date   HGBA1C 6.6 12/01/2023       Assessment & Plan Hypertension Blood pressure slightly low with dizziness upon standing. Potential medication adjustment needed. - Notify cardiology clinic regarding dizziness and low blood pressure for potential medication adjustment.  Heart failure EF 20 to 25% from echo 11/2022, status post ICD Scheduled for echocardiogram to assess ejection fraction. Occasional wheezing due to cold, using albuterol  inhaler as needed. -Continue SGLT2i, beta-blocker, eplerenone  - Undergo echocardiogram to assess ejection fraction.   Type 2 diabetes mellitus A1c 6.6, indicating good control. No recent hypoglycemic episodes. Weight gain noted, transitioning from Ozempic  to Mounjaro for better weight management. - Discontinue Ozempic . - Start Mounjaro 12.5 mg once a week. - Reduce glipizide  to 2.5 mg daily to prevent hypoglycemia. - Continue empagliflozin  (Jardiance ). -Counseled on Diabetic diet, my plate method, 960 minutes of moderate intensity exercise/week Blood sugar logs with fasting goals of 80-120 mg/dl, random of less than 454 and in the event of sugars less than 60 mg/dl or greater than 098 mg/dl encouraged to notify the clinic. Advised on the need for annual eye exams, annual foot exams, Pneumonia vaccine.   Hyperlipidemia Atherosclerosis in the aorta noted - Continues on rosuvastatin  for cholesterol management. - Low-cholesterol diet  Coronary artery disease post-bypass grafting Asymptomatic - Risk factor modification - Continues on rosuvastatin  for cholesterol management.  Emphysema Diagnosed with emphysema following CT scan.  Asymptomatic with no significant  respiratory issues. - Use albuterol  MDI as needed  Phantom limb pain Experiences phantom pain in left lower extremity. Gabapentin  provides partial relief. Increasing dose for better management. - Increase gabapentin  to 300 mg at bedtime. - Send prescription for gabapentin  300 mg to pharmacy.   Dupuytren contracture - He will be discussing this with his orthopedic  No orders of the defined types were placed in this encounter.   Follow-up: No follow-ups on file.       Joaquin Mulberry, MD, FAAFP. [facility] [place], [place] [phone]   12/01/2023, 2:29 PM

## 2023-12-07 ENCOUNTER — Ambulatory Visit (INDEPENDENT_AMBULATORY_CARE_PROVIDER_SITE_OTHER): Payer: Self-pay

## 2023-12-07 DIAGNOSIS — I255 Ischemic cardiomyopathy: Secondary | ICD-10-CM

## 2023-12-07 LAB — CUP PACEART REMOTE DEVICE CHECK
Battery Remaining Longevity: 109 mo
Battery Remaining Percentage: 92 %
Battery Voltage: 3.05 V
Brady Statistic RV Percent Paced: 0 %
Date Time Interrogation Session: 20250513020204
HighPow Impedance: 70 Ohm
Implantable Lead Connection Status: 753985
Implantable Lead Implant Date: 20240731
Implantable Lead Location: 753860
Implantable Pulse Generator Implant Date: 20240731
Lead Channel Impedance Value: 680 Ohm
Lead Channel Pacing Threshold Amplitude: 0.5 V
Lead Channel Pacing Threshold Pulse Width: 0.5 ms
Lead Channel Sensing Intrinsic Amplitude: 12 mV
Lead Channel Setting Pacing Amplitude: 3.5 V
Lead Channel Setting Pacing Pulse Width: 0.5 ms
Lead Channel Setting Sensing Sensitivity: 0.5 mV
Pulse Gen Serial Number: 111074047
Zone Setting Status: 755011

## 2023-12-12 ENCOUNTER — Ambulatory Visit: Payer: Self-pay | Admitting: Cardiology

## 2023-12-17 ENCOUNTER — Ambulatory Visit (HOSPITAL_COMMUNITY): Attending: Family Medicine

## 2023-12-17 ENCOUNTER — Encounter (HOSPITAL_COMMUNITY): Admitting: Cardiology

## 2023-12-28 ENCOUNTER — Other Ambulatory Visit: Payer: Self-pay | Admitting: Family Medicine

## 2023-12-28 NOTE — Telephone Encounter (Unsigned)
 Copied from CRM [id number]. Topic: Clinical - Medication Refill >> Dec 28, 2023  2:51 PM Yolanda T wrote: Medication: albuterol  (VENTOLIN  HFA) 108 (90 Base) MCG/ACT inhaler  Has the patient contacted their pharmacy? Yes This is the patient's preferred pharmacy:  [id number] [place] - [address] [place] [address] Phone: [phone] Fax: [phone]  Is this the correct pharmacy for this prescription? Yes If no, delete pharmacy and type the correct one.   Has the prescription been filled recently? Yes  Is the patient out of the medication? Yes  Has the patient been seen for an appointment in the last year OR does the patient have an upcoming appointment? Yes  Can we respond through MyChart? No  Agent: Please be advised that Rx refills may take up to 3 business days. We ask that you follow-up with your pharmacy.

## 2023-12-29 MED ORDER — ALBUTEROL SULFATE HFA 108 (90 BASE) MCG/ACT IN AERS: 2.0000 | INHALATION_SPRAY | Freq: Four times a day (QID) | RESPIRATORY_TRACT | 1 refills | Status: DC | PRN

## 2023-12-29 NOTE — Telephone Encounter (Signed)
 Requested by patient . Last OV 3 months ago .  Requested Prescriptions  Pending Prescriptions Disp Refills   albuterol  (VENTOLIN  HFA) 108 (90 Base) MCG/ACT inhaler 6.7 g 1    Sig: Inhale 2 puffs into the lungs every 6 (six) hours as needed for wheezing or shortness of breath.     Pulmonology:  Beta Agonists 2 Passed - 12/29/2023  3:34 PM      Passed - Last BP in normal range    BP Readings from Last 1 Encounters:  12/01/23 96/65         Passed - Last Heart Rate in normal range    Pulse Readings from Last 1 Encounters:  12/01/23 89         Passed - Valid encounter within last 12 months    Recent Outpatient Visits           4 weeks ago Type 2 diabetes mellitus with other circulatory complication, without long-term current use of insulin  (HCC)   [facility] [place] Joaquin Mulberry, MD   3 months ago Encounter for [employer] annual wellness exam   [facility] [place]. [facility] Joaquin Mulberry, MD   7 months ago Type 2 diabetes mellitus with other circulatory complication, without long-term current use of insulin  [facility])   [facility] [place]. [facility] Joaquin Mulberry, MD   1 year ago Type 2 diabetes mellitus with other circulatory complication, without long-term current use of insulin  [facility])   [facility] [place]. [facility] Joaquin Mulberry, MD   1 year ago Type 2 diabetes mellitus with other circulatory complication, without long-term current use of insulin  [facility] [facility])   [facility] [place]. [facility] [place], [place], [place]       Future Appointments             In 2 months [place] Job, Jenifer Miu, PA-C [place]

## 2024-01-24 NOTE — Progress Notes (Signed)
 Remote ICD transmission.

## 2024-02-09 ENCOUNTER — Ambulatory Visit (HOSPITAL_COMMUNITY): Payer: Self-pay | Admitting: Cardiology

## 2024-02-09 ENCOUNTER — Ambulatory Visit (HOSPITAL_COMMUNITY)
Admission: RE | Admit: 2024-02-09 | Discharge: 2024-02-09 | Disposition: A | Discharge status: Home / Self Care | Source: Ambulatory Visit | Attending: Family Medicine | Admitting: Family Medicine

## 2024-02-09 ENCOUNTER — Encounter (HOSPITAL_COMMUNITY): Payer: Self-pay | Admitting: Cardiology

## 2024-02-09 ENCOUNTER — Ambulatory Visit (HOSPITAL_BASED_OUTPATIENT_CLINIC_OR_DEPARTMENT_OTHER)
Admission: RE | Admit: 2024-02-09 | Discharge: 2024-02-09 | Disposition: A | Discharge status: Home / Self Care | Payer: Self-pay | Source: Ambulatory Visit | Attending: Cardiology | Admitting: Cardiology

## 2024-02-09 VITALS — BP 90/50 | HR 86 | Wt 240.4 lb

## 2024-02-09 DIAGNOSIS — I2510 Atherosclerotic heart disease of native coronary artery without angina pectoris: Secondary | ICD-10-CM | POA: Insufficient documentation

## 2024-02-09 DIAGNOSIS — Z89432 Acquired absence of left foot: Secondary | ICD-10-CM | POA: Insufficient documentation

## 2024-02-09 DIAGNOSIS — Z955 Presence of coronary angioplasty implant and graft: Secondary | ICD-10-CM | POA: Insufficient documentation

## 2024-02-09 DIAGNOSIS — Z7982 Long term (current) use of aspirin: Secondary | ICD-10-CM | POA: Diagnosis not present

## 2024-02-09 DIAGNOSIS — Z7984 Long term (current) use of oral hypoglycemic drugs: Secondary | ICD-10-CM | POA: Insufficient documentation

## 2024-02-09 DIAGNOSIS — E669 Obesity, unspecified: Secondary | ICD-10-CM | POA: Diagnosis not present

## 2024-02-09 DIAGNOSIS — I5022 Chronic systolic (congestive) heart failure: Secondary | ICD-10-CM

## 2024-02-09 DIAGNOSIS — I130 Hypertensive heart and chronic kidney disease with heart failure and stage 1 through stage 4 chronic kidney disease, or unspecified chronic kidney disease: Secondary | ICD-10-CM | POA: Diagnosis not present

## 2024-02-09 DIAGNOSIS — E785 Hyperlipidemia, unspecified: Secondary | ICD-10-CM | POA: Insufficient documentation

## 2024-02-09 DIAGNOSIS — E1122 Type 2 diabetes mellitus with diabetic chronic kidney disease: Secondary | ICD-10-CM | POA: Diagnosis not present

## 2024-02-09 DIAGNOSIS — I255 Ischemic cardiomyopathy: Secondary | ICD-10-CM | POA: Insufficient documentation

## 2024-02-09 DIAGNOSIS — I5042 Chronic combined systolic (congestive) and diastolic (congestive) heart failure: Secondary | ICD-10-CM

## 2024-02-09 DIAGNOSIS — Z7985 Long-term (current) use of injectable non-insulin antidiabetic drugs: Secondary | ICD-10-CM | POA: Insufficient documentation

## 2024-02-09 DIAGNOSIS — Z951 Presence of aortocoronary bypass graft: Secondary | ICD-10-CM | POA: Insufficient documentation

## 2024-02-09 DIAGNOSIS — I471 Supraventricular tachycardia, unspecified: Secondary | ICD-10-CM | POA: Insufficient documentation

## 2024-02-09 DIAGNOSIS — I272 Pulmonary hypertension, unspecified: Secondary | ICD-10-CM | POA: Diagnosis not present

## 2024-02-09 DIAGNOSIS — Z79899 Other long term (current) drug therapy: Secondary | ICD-10-CM | POA: Diagnosis not present

## 2024-02-09 DIAGNOSIS — Z006 Encounter for examination for normal comparison and control in clinical research program: Secondary | ICD-10-CM

## 2024-02-09 DIAGNOSIS — Z87891 Personal history of nicotine dependence: Secondary | ICD-10-CM | POA: Insufficient documentation

## 2024-02-09 DIAGNOSIS — N183 Chronic kidney disease, stage 3 unspecified: Secondary | ICD-10-CM | POA: Insufficient documentation

## 2024-02-09 LAB — BASIC METABOLIC PANEL WITH GFR
Anion gap: 9 (ref 5–15)
BUN: 27 mg/dL — ABNORMAL HIGH (ref 6–20)
CO2: 25 mmol/L (ref 22–32)
Calcium: 9.4 mg/dL (ref 8.9–10.3)
Chloride: 107 mmol/L (ref 98–111)
Creatinine, Ser: 2.14 mg/dL — ABNORMAL HIGH (ref 0.61–1.24)
GFR, Estimated: 37 mL/min — ABNORMAL LOW (ref >60)
Glucose, Bld: 82 mg/dL (ref 70–99)
Potassium: 4.4 mmol/L (ref 3.5–5.1)
Sodium: 141 mmol/L (ref 135–145)

## 2024-02-09 LAB — ECHOCARDIOGRAM COMPLETE
Area-P 1/2: 7.74 cm2
Est EF: <20
S' Lateral: 4.5 cm
Single Plane A2C EF: 39.5 %

## 2024-02-09 LAB — BRAIN NATRIURETIC PEPTIDE: B Natriuretic Peptide: 27.9 pg/mL (ref 0.0–100.0)

## 2024-02-09 MED ORDER — PERFLUTREN LIPID MICROSPHERE
1.0000 mL | INTRAVENOUS | Status: DC | PRN
  Administered 2024-02-09: 1.5 mL via INTRAVENOUS

## 2024-02-09 MED ORDER — BISOPROLOL FUMARATE 5 MG PO TABS: 2.5000 mg | ORAL_TABLET | Freq: Every day | ORAL | 11 refills | Status: DC

## 2024-02-09 MED ORDER — EZETIMIBE 10 MG PO TABS: 10.0000 mg | ORAL_TABLET | Freq: Every day | ORAL | 3 refills | Status: AC

## 2024-02-09 NOTE — Patient Instructions (Signed)
 STOP Carvedilol   START bisoprolol  2.5 mg ( 1/2 tab) daily.  START Zetia  10 mg daily.  Labs done today, your results will be available in MyChart, we will contact you for abnormal readings.  Your physician recommends that you schedule a follow-up appointment in: 2 months.  If you have any questions or concerns before your next appointment please send us  a message through [place] or call our office at [phone].    TO LEAVE A MESSAGE FOR THE NURSE SELECT OPTION 2, PLEASE LEAVE A MESSAGE INCLUDING: YOUR NAME DATE OF BIRTH CALL BACK NUMBER REASON FOR CALL**this is important as we prioritize the call backs  YOU WILL RECEIVE A CALL BACK THE SAME DAY AS LONG AS YOU CALL BEFORE 4:00 PM  At the [facility], you and your health needs are our priority. As part of our continuing mission to provide you with exceptional heart care, we have created designated Provider Care Teams. These Care Teams include your primary Cardiologist (physician) and Advanced Practice Providers (APPs- Physician Assistants and Nurse Practitioners) who all work together to provide you with the care you need, when you need it.   You may see any of the following providers on your designated Care Team at your next follow up: Dr Toribio Fuel Dr Ezra Shuck Dr. Ria Commander Dr. Morene Brownie Amy Lenetta, NP Caffie Shed, [name] [facility], [name] Beckey Coe, NP [place] Lee, NP Ellouise Class, NP Tinnie Redman, PharmD Jaun Bash, PharmD   Please be sure to bring in all your medications bottles to every appointment.    Thank you for choosing [facility]

## 2024-02-09 NOTE — Research (Addendum)
 SITE: [id number]     Subject # [id number]   Subprotocol: A  Inclusion Criteria  Patients who meet all of the following criteria are eligible for enrollment as study participants:  Yes No  Age > 52 years old X   Eligible to wear Holter Study X    Exclusion Criteria  Patients who meet any of these criteria are not eligible for enrollment as study participants: Yes No  1. Receiving any mechanical (respiratory or circulatory) or renal support therapy at Screening or during Visit #1.  X  2.  Any other conditions that in the opinion of the investigators are likely to prevent compliance with the study protocol or pose a safety concern if the subject participates in the study.  X  3. Poor tolerance, namely susceptible to severe skin allergies from ECG adhesive patch application.  X   Protocol: REV H    60 minute start window         Cor device must be applied, and the study initiated, no later than 60 minutes of completing the Echocardiogram                             HH:MM  Echo completion time  13:48  2.   Cor Study start time  14:04   30-Minute execution window  Once Cor Monitoring begins, 3 QT Med ECGs and the 15-minute rest period must be completed within a 30 minute window     HH:MM  3. QT Med ECG Completion time  14:10  4. Start of 15-Min sitting rest period  14:10  5. End of 15-Min rest period  14:23  6. Time of device removal  14:34   *Continue to use the Mobile App Event feature to log the Rest period windows and follow instructions on the EF-ACT Clinical Trial  Patient Instruction Card.  Describe any anomalies in Protocol execution in the Protocol Deviation Log    Residential Zip code [id number] (First 3 digits ONLY)                                           [facility] Informed Consent   Subject Name: Adam James  Subject met inclusion and exclusion criteria.  The informed consent form, study requirements and expectations were reviewed with the subject. Subject had opportunity to  read consent and questions and concerns were addressed prior to the signing of the consent form.  The subject verbalized understanding of the trial requirements.  The subject agreed to participate in the [facility] EF ACT trial and signed the informed consent at 13:55 on 09-Feb-2024.  The informed consent was obtained prior to performance of any protocol-specific procedures for the subject.  A copy of the signed informed consent was given to the subject and a copy was placed in the subject's medical record.   Adam James          Current Outpatient Medications:    bisoprolol  (ZEBETA ) 5 MG tablet, Take 0.5 tablets (2.5 mg total) by mouth daily., Disp: 15 tablet, Rfl: 11   ezetimibe  (ZETIA ) 10 MG tablet, Take 1 tablet (10 mg total) by mouth daily., Disp: 90 tablet, Rfl: 3   acetaminophen  (TYLENOL ) 325 MG tablet, Take 2 tablets (650 mg total) by mouth every 6 (six) hours as needed for mild pain (or Fever >/= 101)., Disp: 30  tablet, Rfl: 0   albuterol  (VENTOLIN  HFA) 108 (90 Base) MCG/ACT inhaler, Inhale 2 puffs into the lungs every 6 (six) hours as needed for wheezing or shortness of breath., Disp: 6.7 g, Rfl: 1   amiodarone  (PACERONE ) 200 MG tablet, Take 1 tablet (200 mg total) by mouth daily., Disp: 30 tablet, Rfl: 6   ASPIRIN  81 PO, Take 1 tablet by mouth daily., Disp: , Rfl:    Blood Glucose Monitoring Suppl (ONETOUCH VERIO) w/Device KIT, Use to test blood sugar daily, Disp: 1 kit, Rfl: 0   Blood Pressure Monitoring (BLOOD PRESSURE CUFF) MISC, 1 each by Does not apply route as directed. Diagnosis hypertension, Disp: 1 each, Rfl: 0   empagliflozin  (JARDIANCE ) 10 MG TABS tablet, Take 1 tablet (10 mg total) by mouth daily. Please make appointment with Dr. Newlin for more refills., Disp: 30 tablet, Rfl: 6   eplerenone  (INSPRA ) 25 MG tablet, Take 25 mg by mouth daily., Disp: , Rfl:    furosemide  (LASIX ) 20 MG tablet, Take 20 mg by mouth as needed., Disp: , Rfl:    gabapentin  (NEURONTIN ) 300 MG  capsule, Take 1 capsule (300 mg total) by mouth at bedtime., Disp: 90 capsule, Rfl: 1   glipiZIDE  (GLUCOTROL  XL) 2.5 MG 24 hr tablet, Take 1 tablet (2.5 mg total) by mouth daily with breakfast., Disp: 90 tablet, Rfl: 1   glucose blood (ONETOUCH VERIO) test strip, Use as instructed, Disp: 100 each, Rfl: 12   glucose blood (TRUE METRIX BLOOD GLUCOSE TEST) test strip, Use as instructed twice daily, Disp: 100 each, Rfl: 12   Lancets (ONETOUCH DELICA PLUS LANCET33G) MISC, Use as directed daily, Disp: 100 each, Rfl: 3   rosuvastatin  (CRESTOR ) 40 MG tablet, Take 1 tablet (40 mg total) by mouth daily., Disp: 30 tablet, Rfl: 6   Semaglutide , 2 MG/DOSE, 8 MG/3ML SOPN, Inject 2 mg as directed once a week., Disp: 9 mL, Rfl: 6   tirzepatide  (MOUNJARO ) 12.5 MG/0.5ML Pen, Inject 12.5 mg into the skin once a week., Disp: 6 mL, Rfl: 6   TRUEplus Lancets 28G MISC, Use as directed twice daily at 8 am and 10 pm., Disp: 100 each, Rfl: 11 No current facility-administered medications for this visit.  Facility-Administered Medications Ordered in Other Visits:    perflutren  lipid microspheres (DEFINITY ) IV suspension, 1-10 mL, Intravenous, PRN, Newlin, Enobong, MD, 1.5 mL at 02/09/24 1348

## 2024-02-10 NOTE — Progress Notes (Signed)
 Advanced Heart Failure Clinic Note    PCP: Delbert Clam, MD Primary Cardiologist: Redell Leiter, MD  HF Cardiologist: Dr. Rolan  Chief complaint: CHF  HPI: Adam James is a 53 y.o.with a history CAD, Had PCI 2007 in [place] . 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  He had follow up with Dr Leiter 09/09/20 and was set up for cath. Cath with multivessel CAD. He was referred to SW due to financial concerns and no payor source. CMRI showed EF 40% and viable myocardium.      Admitted 02/25/2021 with left foot infection complicated by osteomyelitis and necrotizing fasciitis. 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 Hospital course complicated by erosive esophagitis on EGD. Recommendations for protonix  40 mg bid + repeat EGD in 8 weeks. Received multiple transfusions.   Admitted 03/23/21 with A/C CHF exacerbation. Cardiology consulted. CO-OX 55%.  Diuresed with IV lasix  + metolazone .  Repeat echo on 03/23/21 with LVEF of 25%. RHC performed 03/26/21 showed preserved cardiac output, mildly elevated PCWP, and mild pulmonary venous hypertension. LHC 03/28/21 showed  minimal change from prior study in 2/22.   After full treatment of foot infection, he was admitted in 10/22 for CABG with LIMA-LAD, SVG-OM1, SVG-OM3. PDA too small to graft.   Underwent L BKA 1/23 due to osteomyelitis and abscess.  Admitted 4/23 with acute on chronic CHF. Diuresed with IV lasix . Echo in 4/23 showed EF 20-25%, RV moderately reduced, mild to moderate Adam. GDMT restarted.   Echo 10/23 showed EF 30%, septal hypokinesis with septal-lateral dyssynchrony, mildly decreased RV systolic function, IVC normal.   Seen in ED 05/24 for SVT. Converted with IV amiodarone . Amiodarone  increased to 200 mg daily.   S/p Abbott ICD 7/24.  Admitted 8/24 after a right femoral neck  fracture. He underwent right direct anterior hip arthroplasty. Digoxin  was discontinued, and Coreg  increased.   Echo was done today and reviewed, EF about 30% on my read with grade 1 diastolic dysfunction, mild RV dysfunction.   Today he returns for HF follow up. Now on tirzepatide  and has lost 12 lbs since last appointment.  Walking with his prosthetic, denies exertional dyspnea or chest pain.  Enjoys shooting his bow but cannot shoot as far due to his should injury.  BP has been staying low but denies lightheadedness unless he bends over and stands up.  He is not smoking. He cannot remember to take the evening dose of Coreg . He has not needed Lasix .   Abbott device interrogation (personally reviewed): Stable thoracic impedance, no VT, no V-pacing  ECG (personally reviewed): NSR with repolarization abnormality.   Labs (3/25): K 4.7, creatinine 1.93 Labs (4/25): LDL 75                          PMH: 1. GI bleeding: Erosive esophagitis 8/22.  2. CKD stage 3 3. Type 2 diabetes 4. Hyperlipidemia 5. Left foot/leg diabetic infection with necrotizing fasciitis: 8/22, debridements were done and the left 5th metatarsal was amputated.  1/23 had left BKA.  6. Prior smoker 7. H/o PVC, VT: On amiodarone .  8. CAD: LHC in 9/22 with severe 3 vessel disease.  - CABG (10/22) with LIMA-LAD, SVG-OM2, SVG-OM3.  9. Chronic systolic CHF: Ischemic cardiomyopathy.  - Echo (8/22): LVEF of 25%, LV demonstrating global hypokinesis, and LV diastolic parameters consistent with grade II diastolic dysfunction.  - Echo (4/23): EF 20-25%,  severe LV dysfunction, RV moderately reduced, RVSP 46.8 mmHg, mild to moderate Adam - Echo (10/23): EF 30%, septal hypokinesis with septal-lateral dyssynchrony, mildly decreased RV systolic function, IVC normal.  - Echo (5/24): EF 25-30%, anteroseptal, inferoseptal and inferior HK, RV okay - s/p Abbott ICD 7/24 - Echo (7/25): EF about 30% on my read with grade 1 diastolic dysfunction,  mild RV dysfunction.   Review of Systems: All systems reviewed and negative except as per HPI.   Current Outpatient Medications  Medication Sig Dispense Refill   acetaminophen  (TYLENOL ) 325 MG tablet Take 2 tablets (650 mg total) by mouth every 6 (six) hours as needed for mild pain (or Fever >/= 101). 30 tablet 0   albuterol  (VENTOLIN  HFA) 108 (90 Base) MCG/ACT inhaler Inhale 2 puffs into the lungs every 6 (six) hours as needed for wheezing or shortness of breath. 6.7 g 1   amiodarone  (PACERONE ) 200 MG tablet Take 1 tablet (200 mg total) by mouth daily. 30 tablet 6   ASPIRIN  81 PO Take 1 tablet by mouth daily.     bisoprolol  (ZEBETA ) 5 MG tablet Take 0.5 tablets (2.5 mg total) by mouth daily. 15 tablet 11   Blood Glucose Monitoring Suppl (ONETOUCH VERIO) w/Device KIT Use to test blood sugar daily 1 kit 0   Blood Pressure Monitoring (BLOOD PRESSURE CUFF) MISC 1 each by Does not apply route as directed. Diagnosis hypertension 1 each 0   empagliflozin  (JARDIANCE ) 10 MG TABS tablet Take 1 tablet (10 mg total) by mouth daily. Please make appointment with Dr. Newlin for more refills. 30 tablet 6   eplerenone  (INSPRA ) 25 MG tablet Take 25 mg by mouth daily.     ezetimibe  (ZETIA ) 10 MG tablet Take 1 tablet (10 mg total) by mouth daily. 90 tablet 3   furosemide  (LASIX ) 20 MG tablet Take 20 mg by mouth as needed.     gabapentin  (NEURONTIN ) 300 MG capsule Take 1 capsule (300 mg total) by mouth at bedtime. 90 capsule 1   glipiZIDE  (GLUCOTROL  XL) 2.5 MG 24 hr tablet Take 1 tablet (2.5 mg total) by mouth daily with breakfast. 90 tablet 1   glucose blood (ONETOUCH VERIO) test strip Use as instructed 100 each 12   glucose blood (TRUE METRIX BLOOD GLUCOSE TEST) test strip Use as instructed twice daily 100 each 12   Lancets (ONETOUCH DELICA PLUS LANCET33G) MISC Use as directed daily 100 each 3   rosuvastatin  (CRESTOR ) 40 MG tablet Take 1 tablet (40 mg total) by mouth daily. 30 tablet 6   tirzepatide  (MOUNJARO )  12.5 MG/0.5ML Pen Inject 12.5 mg into the skin once a week. 6 mL 6   TRUEplus Lancets 28G MISC Use as directed twice daily at 8 am and 10 pm. 100 each 11   Semaglutide , 2 MG/DOSE, 8 MG/3ML SOPN Inject 2 mg as directed once a week. 9 mL 6   No current facility-administered medications for this encounter.   Allergies  Allergen Reactions   Morphine Itching    Itching under skin   Social History   Socioeconomic History   Marital status: Single    Spouse name: Not on file   Number of children: 1   Years of education: Not on file   Highest education level: 8th grade  Occupational History   Occupation: none    Comment: former Air traffic controller  Tobacco Use   Smoking status: Former    Current packs/day: 0.00    Average packs/day: 3.0 packs/day for 36.0 years (108.0 ttl  pk-yrs)    Types: Cigarettes    Start date: 08/27/1984    Quit date: 08/27/2020    Years since quitting: 3.4   Smokeless tobacco: Never  Vaping Use   Vaping status: Never Used  Substance and Sexual Activity   Alcohol use: Not Currently    Comment: Very rare   Drug use: Not Currently    Types: Marijuana    Comment: smoked marijuana in his youth(teenager)   Sexual activity: Not on file  Other Topics Concern   Not on file  Social History Narrative   Not on file   Social Drivers of Health   Financial Resource Strain: Low Risk  (09/22/2023)   Overall Financial Resource Strain (CARDIA)    Difficulty of Paying Living Expenses: Not hard at all  Food Insecurity: No Food Insecurity (09/22/2023)   Hunger Vital Sign    Worried About Running Out of Food in the Last Year: Never true    Ran Out of Food in the Last Year: Never true  Transportation Needs: No Transportation Needs (09/22/2023)   PRAPARE - Administrator, Civil Service (Medical): No    Lack of Transportation (Non-Medical): No  Physical Activity: Inactive (09/22/2023)   Exercise Vital Sign    Days of Exercise per Week: 0 days    Minutes of Exercise per  Session: 0 min  Stress: No Stress Concern Present (09/22/2023)   [employer] of Occupational Health - Occupational Stress Questionnaire    Feeling of Stress : Not at all  Social Connections: Socially Isolated (09/22/2023)   Social Connection and Isolation Panel    Frequency of Communication with Friends and Family: Three times a week    Frequency of Social Gatherings with Friends and Family: Once a week    Attends Religious Services: Never    Database administrator or Organizations: No    Attends Banker Meetings: Never    Marital Status: Never married  Intimate Partner Violence: Not At Risk (09/22/2023)   Humiliation, Afraid, Rape, and Kick questionnaire    Fear of Current or Ex-Partner: No    Emotionally Abused: No    Physically Abused: No    Sexually Abused: No   Family History  Problem Relation Age of Onset   Hypertension Mother    Heart failure Father    Hypertension Brother    Congestive Heart Failure Brother    BP (!) 90/50   Pulse 86   Wt 109 kg (240 lb 6.4 oz)   SpO2 98%   BMI 30.05 kg/m   Wt Readings from Last 3 Encounters:  02/09/24 109 kg (240 lb 6.4 oz)  12/01/23 113.5 kg (250 lb 3.2 oz)  10/08/23 114.3 kg (252 lb)   PHYSICAL EXAM: General: NAD Neck: No JVD, no thyromegaly or thyroid  nodule.  Lungs: Clear to auscultation bilaterally with normal respiratory effort. CV: Nondisplaced PMI.  Heart regular S1/S2, no S3/S4, no murmur.  No peripheral edema.  No carotid bruit.  Normal pedal pulses.  Abdomen: Soft, nontender, no hepatosplenomegaly, no distention.  Skin: Intact without lesions or rashes.  Neurologic: Alert and oriented x 3.  Psych: Normal affect. Extremities: No clubbing or cyanosis. Left BKA.  HEENT: Normal.   ASSESSMENT & PLAN: 1. Chronic systolic CHF: Ischemic cardiomyopathy. Abbott ICD. Initial echo in 2/22 with EF 40-45%.  Echo in 7/22 with EF 30-35%.  Echo 8/22 EF down to 25% with mild RV dysfunction and dilated IVC.  RHC  8/22 with preserved CO,  mildly elevated PCWP, mild pulmonary hypertension. S/p CABG 10/22. Readmitted 4/23 with CHF, had been off diuretics and missed follow up. Echo in 4/23 with EF 20-25%, RV moderately reduced, mild to moderate Adam. Echo 10/23 EF 30%, septal hypokinesis with septal-lateral dyssynchrony, mildly decreased RV systolic function, IVC normal.  Echo 5/24 showed EF 25-30%, RV okay. I reviewed today's echo, EF about 30% on my read with grade 1 diastolic dysfunction, mild RV dysfunction.  NYHA class I-II, not volume overloaded on exam or Corvue. Soft BP and CKD stage 3 limit GDMT titration.  - He has Lasix  for prn use, has not needed recently.  - Continue Jardiance  10 mg daily.  - Can only remember to take Coreg  once daily.  I will stop Coreg  and start bisoprolol  2.5 mg daily.  - Continue eplerenone  25 mg daily, check BMET/BNP today.  - No BP room to add ARB/ARNI.  2. CAD: Hx PCI 2007. S/p CABG x 3 with LIMA-LAD, SVG-OM2, and SVG-OM3 10/22. No chest pain.  - Continue  ASA 81 daily.  - Continue Crestor  40 mg daily.  - With goal LDL < 55, I will add Zetia  10 mg daily. Lipids in 2 months.  3. PVCs/VT: Noted 10/22 post CABG. Denies palpitations. - Continue amiodarone  200 mg daily.  Check LFTs and TSH, he will need regular eye exam. Can probably decrease dose to 100 mg daily at next appt.  4. SVT: Episode 5/24. Converted with IV amiodarone . - Continue amiodarone  200 mg daily => as above, decrease dose to 100 mg daily at next appt.  5. CKD stage 3: Scr baseline around 1.9.  - Continue SGLT2i - BMET today. 6. GI bleeding: History of erosive esophagitis in 8/22.   7. Left leg diabetic foot infection: Underwent left BKA 1/23. And revision of L BKA 10/23. Left stump now healed.  - Has prosthesis and walking.  8. Obesity: On tirzepatide  and weight trending down.   Follow up in 2 months with APP.   I spent 31 minutes reviewing records, interviewing/examining patient, and managing orders.    Adam James  02/10/24

## 2024-02-20 ENCOUNTER — Other Ambulatory Visit: Payer: Self-pay | Admitting: Family Medicine

## 2024-02-20 DIAGNOSIS — E1159 Type 2 diabetes mellitus with other circulatory complications: Secondary | ICD-10-CM

## 2024-02-27 ENCOUNTER — Other Ambulatory Visit: Payer: Self-pay | Admitting: Family Medicine

## 2024-02-28 ENCOUNTER — Other Ambulatory Visit: Payer: Self-pay | Admitting: Family Medicine

## 2024-03-07 ENCOUNTER — Ambulatory Visit (INDEPENDENT_AMBULATORY_CARE_PROVIDER_SITE_OTHER): Payer: Self-pay

## 2024-03-07 DIAGNOSIS — I255 Ischemic cardiomyopathy: Secondary | ICD-10-CM

## 2024-03-08 LAB — CUP PACEART REMOTE DEVICE CHECK
Battery Remaining Longevity: 107 mo
Battery Remaining Percentage: 90 %
Battery Voltage: 3.02 V
Brady Statistic RV Percent Paced: 0 %
Date Time Interrogation Session: 20250812020101
HighPow Impedance: 74 Ohm
Implantable Lead Connection Status: 753985
Implantable Lead Implant Date: 20240731
Implantable Lead Location: 753860
Implantable Pulse Generator Implant Date: 20240731
Lead Channel Impedance Value: 740 Ohm
Lead Channel Pacing Threshold Amplitude: 0.5 V
Lead Channel Pacing Threshold Pulse Width: 0.5 ms
Lead Channel Sensing Intrinsic Amplitude: 12 mV
Lead Channel Setting Pacing Amplitude: 3.5 V
Lead Channel Setting Pacing Pulse Width: 0.5 ms
Lead Channel Setting Sensing Sensitivity: 0.5 mV
Pulse Gen Serial Number: 111074047
Zone Setting Status: 755011

## 2024-03-09 ENCOUNTER — Ambulatory Visit: Payer: Self-pay | Admitting: Cardiology

## 2024-03-22 ENCOUNTER — Encounter: Payer: Self-pay | Admitting: Physician Assistant

## 2024-03-22 ENCOUNTER — Other Ambulatory Visit (INDEPENDENT_AMBULATORY_CARE_PROVIDER_SITE_OTHER)

## 2024-03-22 ENCOUNTER — Ambulatory Visit (INDEPENDENT_AMBULATORY_CARE_PROVIDER_SITE_OTHER): Admitting: Physician Assistant

## 2024-03-22 DIAGNOSIS — Z89512 Acquired absence of left leg below knee: Secondary | ICD-10-CM | POA: Diagnosis not present

## 2024-03-22 DIAGNOSIS — Z96641 Presence of right artificial hip joint: Secondary | ICD-10-CM | POA: Diagnosis not present

## 2024-03-22 NOTE — Progress Notes (Signed)
 HPI: Adam James returns today 1 year status post right total hip arthroplasty.  He states he has some numbness sensation the lateral aspect of the thigh but otherwise no groin pain.  He does continue to work on IT band stretching.  He is also asking about new prescription for new prosthesis.  History of left below-knee amputation by Dr. Harden.  At this point time he is having to wear 6 socks due to the fact that his socket does not fit anymore.  He is quite active otherwise.  He was not using any assistive device to ambulate.  Review of systems: See HPI otherwise negative  Physical exam: General Well-developed well-nourished male no acute distress mood and affect appropriate. Right hip: Good range of motion of the hip nontender of the IT band and trochanteric region. Left knee: Good range of motion of the knee.  Stump is well-healed at the surgical incisions no signs of infection.  Socket of prosthesis is loose without the 6 socks.  Radiographs: AP pelvis: Bilateral hips well located.  Status post right total hip arthroplasty with well-seated components.  No acute fractures or acute findings. Impression: History of right total hip arthroplasty 03/20/2023 History of BKA Poor fitting prosthesis  Plan: In regards to his right total hip arthroplasty will follow-up with us  as needed.  He is given prescription for [facility] therapy for a new socket liner and supplies.  He will follow-up with Dr. Manny as needed.  Questions were encouraged and answered at length.

## 2024-03-23 ENCOUNTER — Telehealth (HOSPITAL_COMMUNITY): Payer: Self-pay

## 2024-03-23 NOTE — Addendum Note (Signed)
 Addended by: ALVAN COPA R on: 03/23/2024 02:46 PM   Modules accepted: Orders

## 2024-03-23 NOTE — Telephone Encounter (Signed)
Patient advised and verbalized understanding. Med list updated to reflect changes.  ? ?

## 2024-03-23 NOTE — Telephone Encounter (Signed)
 Patient called to report that for the last several days he has been experiencing low blood pressures along with dizziness only(no dizziness today) 90/60,96/66 and today 121/83 with a HR of 82. Patient has not taken any medication today and wants to know what he should do. Please advise.

## 2024-03-29 ENCOUNTER — Other Ambulatory Visit (HOSPITAL_COMMUNITY): Payer: Self-pay

## 2024-03-29 DIAGNOSIS — I5022 Chronic systolic (congestive) heart failure: Secondary | ICD-10-CM

## 2024-03-29 MED ORDER — AMIODARONE HCL 200 MG PO TABS: 200.0000 mg | ORAL_TABLET | Freq: Every day | ORAL | 6 refills | Status: DC

## 2024-03-31 ENCOUNTER — Telehealth (HOSPITAL_COMMUNITY): Payer: Self-pay | Admitting: Cardiology

## 2024-03-31 NOTE — Telephone Encounter (Signed)
 Patient called to report he has joined the [employer], questioned if he is cleared to light weight with decreased heart functions

## 2024-04-02 NOTE — Telephone Encounter (Signed)
 Yes, fine for him to lift light weights.

## 2024-04-04 ENCOUNTER — Other Ambulatory Visit (HOSPITAL_COMMUNITY): Payer: Self-pay

## 2024-04-04 DIAGNOSIS — I5022 Chronic systolic (congestive) heart failure: Secondary | ICD-10-CM

## 2024-04-04 MED ORDER — EPLERENONE 25 MG PO TABS: 25.0000 mg | ORAL_TABLET | Freq: Every day | ORAL | 3 refills | Status: DC

## 2024-04-04 NOTE — Telephone Encounter (Signed)
 Pt aware.

## 2024-04-07 NOTE — Progress Notes (Signed)
 [facility] Note    PCP: Delbert Clam, MD Primary Cardiologist: Redell Leiter, MD  HF Cardiologist: Dr. Rolan  HPI: Adam James is a 53 y.o.with a history CAD, Had PCI 2007 in [place] . 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  He had follow up with Dr Leiter 09/09/20 and was set up for cath. Cath with multivessel CAD. He was referred to SW due to financial concerns and no payor source. CMRI showed EF 40% and viable myocardium.      Admitted 02/25/2021 with left foot infection complicated by osteomyelitis and necrotizing fasciitis. 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 Hospital course complicated by erosive esophagitis on EGD. Recommendations for protonix  40 mg bid + repeat EGD in 8 weeks. Received multiple transfusions.   Admitted 03/23/21 with A/C CHF exacerbation. Cardiology consulted. CO-OX 55%.  Diuresed with IV lasix  + metolazone .  Repeat echo on 03/23/21 with LVEF of 25%. RHC performed 03/26/21 showed preserved cardiac output, mildly elevated PCWP, and mild pulmonary venous hypertension. LHC 03/28/21 showed  minimal change from prior study in 2/22.   After full treatment of foot infection, he was admitted in 10/22 for CABG with LIMA-LAD, SVG-OM1, SVG-OM3. PDA too small to graft.   Underwent L BKA 1/23 due to osteomyelitis and abscess.  Admitted 4/23 with acute on chronic CHF. Diuresed with IV lasix . Echo in 4/23 showed EF 20-25%, RV moderately reduced, mild to moderate Adam. GDMT restarted.   Echo 10/23 showed EF 30%, septal hypokinesis with septal-lateral dyssynchrony, mildly decreased RV systolic function, IVC normal.   Seen in ED 05/24 for SVT. Converted with IV amiodarone . Amiodarone  increased to 200 mg daily.   S/p Abbott ICD 7/24.  Admitted 8/24 after a right femoral neck fracture. He underwent right  direct anterior hip arthroplasty. Digoxin  was discontinued, and Coreg  increased.   Echo 7/25 showed EF about 30% (on Dr. Orvilla read) with grade 1 diastolic dysfunction, mild RV dysfunction.   Today he returns for HF follow up. Overall feeling fine. No SOB with activity. Planning to join [facility] soon. He enjoys using bow and arrow. Dizziness and low BP resolved when he stopped bisoprolol . Denies palpitations, abnormal bleeding, CP, dizziness, edema, or PND/Orthopnea. Appetite ok. Weight at home 235 pounds. Taking all medications. Has not had to use Lasix .   St Jude device interrogation (personally reviewed): Thoracic impedence stable, no VT, no V-pacing  ECG (personally reviewed): none ordered today  Labs (3/25): K 4.7, creatinine 1.93 Labs (4/25): LDL 75 Labs (7/25): K 4.4, creatinine 2.14                          PMH: 1. GI bleeding: Erosive esophagitis 8/22.  2. CKD stage 3 3. Type 2 diabetes 4. Hyperlipidemia 5. Left foot/leg diabetic infection with necrotizing fasciitis: 8/22, debridements were done and the left 5th metatarsal was amputated.  1/23 had left BKA.  6. Prior smoker 7. H/o PVC, VT: On amiodarone .  8. CAD: LHC in 9/22 with severe 3 vessel disease.  - CABG (10/22) with LIMA-LAD, SVG-OM2, SVG-OM3.  9. Chronic systolic CHF: Ischemic cardiomyopathy.  - Echo (8/22): LVEF of 25%, LV demonstrating global hypokinesis, and LV diastolic parameters consistent with grade II diastolic dysfunction.  - Echo (4/23): EF 20-25%, severe LV dysfunction, RV moderately reduced, RVSP 46.8 mmHg, mild to moderate Adam - Echo (10/23): EF 30%, septal hypokinesis with  septal-lateral dyssynchrony, mildly decreased RV systolic function, IVC normal.  - Echo (5/24): EF 25-30%, anteroseptal, inferoseptal and inferior HK, RV okay - s/p Abbott ICD 7/24 - Echo (7/25): EF about 30% on Dr. Orvilla read with grade 1 diastolic dysfunction, mild RV dysfunction.   Review of Systems: All systems reviewed and  negative except as per HPI.   Current Outpatient Medications  Medication Sig Dispense Refill   acetaminophen  (TYLENOL ) 325 MG tablet Take 2 tablets (650 mg total) by mouth every 6 (six) hours as needed for mild pain (or Fever >/= 101). 30 tablet 0   albuterol  (VENTOLIN  HFA) 108 (90 Base) MCG/ACT inhaler INHALE 2 PUFFS INTO THE LUNGS EVERY 6 HOURS AS NEEDED FOR WHEEZING OR SHORTNESS OF BREATH 6.7 g 1   amiodarone  (PACERONE ) 200 MG tablet Take 1 tablet (200 mg total) by mouth daily. 30 tablet 6   ASPIRIN  81 PO Take 1 tablet by mouth daily.     Blood Glucose Monitoring Suppl (ONETOUCH VERIO) w/Device KIT Use to test blood sugar daily 1 kit 0   Blood Pressure Monitoring (BLOOD PRESSURE CUFF) MISC 1 each by Does not apply route as directed. Diagnosis hypertension 1 each 0   empagliflozin  (JARDIANCE ) 10 MG TABS tablet Take 1 tablet (10 mg total) by mouth daily. Please make appointment with Dr. Newlin for more refills. 30 tablet 6   eplerenone  (INSPRA ) 25 MG tablet Take 1 tablet (25 mg total) by mouth daily. 90 tablet 3   ezetimibe  (ZETIA ) 10 MG tablet Take 1 tablet (10 mg total) by mouth daily. 90 tablet 3   furosemide  (LASIX ) 20 MG tablet Take 20 mg by mouth as needed.     gabapentin  (NEURONTIN ) 300 MG capsule TAKE 1 CAPSULE(300 MG) BY MOUTH AT BEDTIME 90 capsule 1   glipiZIDE  (GLUCOTROL  XL) 2.5 MG 24 hr tablet Take 1 tablet (2.5 mg total) by mouth daily with breakfast. 90 tablet 1   glucose blood (ONETOUCH VERIO) test strip Use as instructed 100 each 12   glucose blood (TRUE METRIX BLOOD GLUCOSE TEST) test strip Use as instructed twice daily 100 each 12   Lancets (ONETOUCH DELICA PLUS LANCET33G) MISC Use as directed daily 100 each 3   rosuvastatin  (CRESTOR ) 40 MG tablet Take 1 tablet (40 mg total) by mouth daily. 30 tablet 6   Semaglutide , 2 MG/DOSE, 8 MG/3ML SOPN Inject 2 mg as directed once a week. 9 mL 6   tirzepatide  (MOUNJARO ) 12.5 MG/0.5ML Pen Inject 12.5 mg into the skin once a week. 6 mL 6    TRUEplus Lancets 28G MISC Use as directed twice daily at 8 am and 10 pm. 100 each 11   No current facility-administered medications for this encounter.   Allergies  Allergen Reactions   Morphine Itching    Itching under skin   Social History   Socioeconomic History   Marital status: Single    Spouse name: Not on file   Number of children: 1   Years of education: Not on file   Highest education level: 8th grade  Occupational History   Occupation: none    Comment: former Air traffic controller  Tobacco Use   Smoking status: Former    Current packs/day: 0.00    Average packs/day: 3.0 packs/day for 36.0 years (108.0 ttl pk-yrs)    Types: Cigarettes    Start date: 08/27/1984    Quit date: 08/27/2020    Years since quitting: 3.6   Smokeless tobacco: Never  Vaping Use   Vaping status: Never  Used  Substance and Sexual Activity   Alcohol use: Not Currently    Comment: Very rare   Drug use: Not Currently    Types: Marijuana    Comment: smoked marijuana in his youth(teenager)   Sexual activity: Not on file  Other Topics Concern   Not on file  Social History Narrative   Not on file   Social Drivers of Health   Financial Resource Strain: Low Risk  (09/22/2023)   Overall Financial Resource Strain (CARDIA)    Difficulty of Paying Living Expenses: Not hard at all  Food Insecurity: No Food Insecurity (09/22/2023)   Hunger Vital Sign    Worried About Running Out of Food in the Last Year: Never true    Ran Out of Food in the Last Year: Never true  Transportation Needs: No Transportation Needs (09/22/2023)   PRAPARE - Administrator, Civil Service (Medical): No    Lack of Transportation (Non-Medical): No  Physical Activity: Inactive (09/22/2023)   Exercise Vital Sign    Days of Exercise per Week: 0 days    Minutes of Exercise per Session: 0 min  Stress: No Stress Concern Present (09/22/2023)   [employer] of Occupational Health - Occupational Stress Questionnaire    Feeling of  Stress : Not at all  Social Connections: Socially Isolated (09/22/2023)   Social Connection and Isolation Panel    Frequency of Communication with Friends and Family: Three times a week    Frequency of Social Gatherings with Friends and Family: Once a week    Attends Religious Services: Never    Database administrator or Organizations: No    Attends Banker Meetings: Never    Marital Status: Never married  Intimate Partner Violence: Not At Risk (09/22/2023)   Humiliation, Afraid, Rape, and Kick questionnaire    Fear of Current or Ex-Partner: No    Emotionally Abused: No    Physically Abused: No    Sexually Abused: No   Family History  Problem Relation Age of Onset   Hypertension Mother    Heart failure Father    Hypertension Brother    Congestive Heart Failure Brother    BP 100/74   Pulse 89   Ht 6' 3 (1.905 m)   Wt 106.6 kg (235 lb)   SpO2 94%   BMI 29.37 kg/m   Wt Readings from Last 3 Encounters:  04/11/24 106.6 kg (235 lb)  02/09/24 109 kg (240 lb 6.4 oz)  12/01/23 113.5 kg (250 lb 3.2 oz)   PHYSICAL EXAM: General:  NAD. No resp difficulty, walked into clinic HEENT: Normal Neck: Supple. No JVD. Cor: Regular rate & rhythm. No rubs, gallops or murmurs. Lungs: Clear Abdomen: Soft, nontender, nondistended.  Extremities: No cyanosis, clubbing, rash, edema; L BKA Neuro: Alert & oriented x 3, moves all 4 extremities w/o difficulty. Affect pleasant.  ASSESSMENT & PLAN: 1. Chronic systolic CHF: Ischemic cardiomyopathy. Abbott ICD. Initial echo in 2/22 with EF 40-45%.  Echo in 7/22 with EF 30-35%.  Echo 8/22 EF down to 25% with mild RV dysfunction and dilated IVC.  RHC 8/22 with preserved CO, mildly elevated PCWP, mild pulmonary hypertension. S/p CABG 10/22. Readmitted 4/23 with CHF, had been off diuretics and missed follow up. Echo in 4/23 with EF 20-25%, RV moderately reduced, mild to moderate Adam. Echo 10/23 EF 30%, septal hypokinesis with septal-lateral  dyssynchrony, mildly decreased RV systolic function, IVC normal.  Echo 5/24 showed EF 25-30%, RV okay. Echo 7/25:  EF about 30% (on Dr. Orvilla read) with grade 1 diastolic dysfunction, mild RV dysfunction.  NYHA class I-II, not volume overloaded on exam or Corvue. Soft BP and CKD stage 3 limit GDMT titration.  - Continue Lasix  20 mg PRN. Has not needed recently.  - Continue Jardiance  10 mg daily.  - Continue eplerenone  25 mg daily, check BMET/BNP today.  - Off bisoprolol  with low BP and orthostasis (consider Toprol  next visit, could not remember BID dosing for Coreg ). - No BP room to add ARB/ARNI.  2. CAD: Hx PCI 2007. S/p CABG x 3 with LIMA-LAD, SVG-OM2, and SVG-OM3 10/22. No chest pain.  - Continue  ASA 81 daily.  - Continue Crestor  40 mg daily + Zetia  10 mg daily. - Goal LDL < 55, check LFTs/lipids today.  3. PVCs/VT: Noted 10/22 post CABG. Denies palpitations. - Decrease amiodarone  to 100 mg daily.  Check LFTs and TSH, he will need regular eye exam.  4. SVT: Episode 5/24. Converted with IV amiodarone . - Decrease amiodarone  to 100 mg daily, as above. 5. CKD stage 3: Scr baseline around 1.9.  - Continue SGLT2i. BMET today. 6. GI bleeding: History of erosive esophagitis in 8/22.   7. Left leg diabetic foot infection: Underwent left BKA 1/23. And revision of L BKA 10/23. Left stump now healed.  - Has prosthesis and walking.  8. Overweight: Body mass index is 29.37 kg/m. - On tirzepatide  and weight trending down.   Follow up in 4 months with Dr. Rolan Raisin [facility] FNP-BC 04/11/24

## 2024-04-10 ENCOUNTER — Telehealth (HOSPITAL_COMMUNITY): Payer: Self-pay | Admitting: *Deleted

## 2024-04-10 NOTE — Telephone Encounter (Signed)
 Called to confirm/remind patient of their appointment at the [facility] on   04/11/24:     Appointment:              [x] Confirmed             [] Left mess              [] No answer/No voice mail             [] Phone not in service   Patient reminded to bring all medications and/or complete list.   Confirmed patient has transportation. Gave directions, instructed to utilize valet parking.

## 2024-04-11 ENCOUNTER — Ambulatory Visit (HOSPITAL_COMMUNITY)
Admission: RE | Admit: 2024-04-11 | Discharge: 2024-04-11 | Disposition: A | Discharge status: Home / Self Care | Source: Ambulatory Visit | Attending: Family Medicine

## 2024-04-11 ENCOUNTER — Encounter (HOSPITAL_COMMUNITY): Payer: Self-pay

## 2024-04-11 VITALS — BP 100/74 | HR 89 | Ht 75.0 in | Wt 235.0 lb

## 2024-04-11 DIAGNOSIS — I2510 Atherosclerotic heart disease of native coronary artery without angina pectoris: Secondary | ICD-10-CM | POA: Insufficient documentation

## 2024-04-11 DIAGNOSIS — Z9861 Coronary angioplasty status: Secondary | ICD-10-CM | POA: Diagnosis not present

## 2024-04-11 DIAGNOSIS — Z7984 Long term (current) use of oral hypoglycemic drugs: Secondary | ICD-10-CM | POA: Diagnosis not present

## 2024-04-11 DIAGNOSIS — Z89619 Acquired absence of unspecified leg above knee: Secondary | ICD-10-CM

## 2024-04-11 DIAGNOSIS — Z7982 Long term (current) use of aspirin: Secondary | ICD-10-CM | POA: Insufficient documentation

## 2024-04-11 DIAGNOSIS — E785 Hyperlipidemia, unspecified: Secondary | ICD-10-CM | POA: Diagnosis not present

## 2024-04-11 DIAGNOSIS — Z6829 Body mass index (BMI) 29.0-29.9, adult: Secondary | ICD-10-CM | POA: Insufficient documentation

## 2024-04-11 DIAGNOSIS — Z87891 Personal history of nicotine dependence: Secondary | ICD-10-CM | POA: Insufficient documentation

## 2024-04-11 DIAGNOSIS — I130 Hypertensive heart and chronic kidney disease with heart failure and stage 1 through stage 4 chronic kidney disease, or unspecified chronic kidney disease: Secondary | ICD-10-CM | POA: Diagnosis not present

## 2024-04-11 DIAGNOSIS — I493 Ventricular premature depolarization: Secondary | ICD-10-CM | POA: Insufficient documentation

## 2024-04-11 DIAGNOSIS — Z8719 Personal history of other diseases of the digestive system: Secondary | ICD-10-CM

## 2024-04-11 DIAGNOSIS — E11628 Type 2 diabetes mellitus with other skin complications: Secondary | ICD-10-CM | POA: Diagnosis not present

## 2024-04-11 DIAGNOSIS — I255 Ischemic cardiomyopathy: Secondary | ICD-10-CM | POA: Insufficient documentation

## 2024-04-11 DIAGNOSIS — I272 Pulmonary hypertension, unspecified: Secondary | ICD-10-CM | POA: Diagnosis not present

## 2024-04-11 DIAGNOSIS — Z96641 Presence of right artificial hip joint: Secondary | ICD-10-CM | POA: Insufficient documentation

## 2024-04-11 DIAGNOSIS — I471 Supraventricular tachycardia, unspecified: Secondary | ICD-10-CM | POA: Insufficient documentation

## 2024-04-11 DIAGNOSIS — Z951 Presence of aortocoronary bypass graft: Secondary | ICD-10-CM | POA: Insufficient documentation

## 2024-04-11 DIAGNOSIS — E1169 Type 2 diabetes mellitus with other specified complication: Secondary | ICD-10-CM | POA: Insufficient documentation

## 2024-04-11 DIAGNOSIS — Z79899 Other long term (current) drug therapy: Secondary | ICD-10-CM | POA: Insufficient documentation

## 2024-04-11 DIAGNOSIS — I5022 Chronic systolic (congestive) heart failure: Secondary | ICD-10-CM | POA: Insufficient documentation

## 2024-04-11 DIAGNOSIS — M726 Necrotizing fasciitis: Secondary | ICD-10-CM | POA: Insufficient documentation

## 2024-04-11 DIAGNOSIS — N183 Chronic kidney disease, stage 3 unspecified: Secondary | ICD-10-CM | POA: Insufficient documentation

## 2024-04-11 DIAGNOSIS — I878 Other specified disorders of veins: Secondary | ICD-10-CM | POA: Diagnosis not present

## 2024-04-11 DIAGNOSIS — E1122 Type 2 diabetes mellitus with diabetic chronic kidney disease: Secondary | ICD-10-CM | POA: Insufficient documentation

## 2024-04-11 DIAGNOSIS — Z5986 Financial insecurity: Secondary | ICD-10-CM | POA: Diagnosis not present

## 2024-04-11 DIAGNOSIS — E663 Overweight: Secondary | ICD-10-CM | POA: Diagnosis not present

## 2024-04-11 DIAGNOSIS — Z7985 Long-term (current) use of injectable non-insulin antidiabetic drugs: Secondary | ICD-10-CM | POA: Insufficient documentation

## 2024-04-11 DIAGNOSIS — L0291 Cutaneous abscess, unspecified: Secondary | ICD-10-CM | POA: Insufficient documentation

## 2024-04-11 DIAGNOSIS — N1831 Chronic kidney disease, stage 3a: Secondary | ICD-10-CM | POA: Diagnosis not present

## 2024-04-11 DIAGNOSIS — M869 Osteomyelitis, unspecified: Secondary | ICD-10-CM | POA: Insufficient documentation

## 2024-04-11 LAB — LIPID PANEL
Cholesterol: 111 mg/dL (ref 0–200)
HDL: 39 mg/dL — ABNORMAL LOW (ref >40)
LDL Cholesterol: 52 mg/dL (ref 0–99)
Total CHOL/HDL Ratio: 2.8 ratio
Triglycerides: 102 mg/dL (ref <150)
VLDL: 20 mg/dL (ref 0–40)

## 2024-04-11 LAB — COMPREHENSIVE METABOLIC PANEL WITH GFR
ALT: 27 U/L (ref 0–44)
AST: 26 U/L (ref 15–41)
Albumin: 3.9 g/dL (ref 3.5–5.0)
Alkaline Phosphatase: 64 U/L (ref 38–126)
Anion gap: 11 (ref 5–15)
BUN: 14 mg/dL (ref 6–20)
CO2: 22 mmol/L (ref 22–32)
Calcium: 9.1 mg/dL (ref 8.9–10.3)
Chloride: 105 mmol/L (ref 98–111)
Creatinine, Ser: 1.78 mg/dL — ABNORMAL HIGH (ref 0.61–1.24)
GFR, Estimated: 46 mL/min — ABNORMAL LOW (ref >60)
Glucose, Bld: 111 mg/dL — ABNORMAL HIGH (ref 70–99)
Potassium: 4.7 mmol/L (ref 3.5–5.1)
Sodium: 138 mmol/L (ref 135–145)
Total Bilirubin: 0.9 mg/dL (ref 0.0–1.2)
Total Protein: 7.2 g/dL (ref 6.5–8.1)

## 2024-04-11 LAB — TSH: TSH: 1.837 u[IU]/mL (ref 0.350–4.500)

## 2024-04-11 LAB — BRAIN NATRIURETIC PEPTIDE: B Natriuretic Peptide: 25.9 pg/mL (ref 0.0–100.0)

## 2024-04-11 MED ORDER — AMIODARONE HCL 200 MG PO TABS: 100.0000 mg | ORAL_TABLET | Freq: Every day | ORAL | Status: AC

## 2024-04-11 NOTE — Patient Instructions (Signed)
 DECREASE Amiodarone  to 100 mg daily.  Labs done today, your results will be available in MyChart, we will contact you for abnormal readings.  Your physician recommends that you schedule a follow-up appointment in: 4 months ( January 2026) ** PLEASE CALL THE OFFICE IN NOVEMBER TO ARRANGE YOUR FOLLOW UP APPOINTMENT.**  If you have any questions or concerns before your next appointment please send us  a message through [place] or call our office at [phone].    TO LEAVE A MESSAGE FOR THE NURSE SELECT OPTION 2, PLEASE LEAVE A MESSAGE INCLUDING: YOUR NAME DATE OF BIRTH CALL BACK NUMBER REASON FOR CALL**this is important as we prioritize the call backs  YOU WILL RECEIVE A CALL BACK THE SAME DAY AS LONG AS YOU CALL BEFORE 4:00 PM  At the [facility], you and your health needs are our priority. As part of our continuing mission to provide you with exceptional heart care, we have created designated Provider Care Teams. These Care Teams include your primary Cardiologist (physician) and Advanced Practice Providers (APPs- Physician Assistants and Nurse Practitioners) who all work together to provide you with the care you need, when you need it.   You may see any of the following providers on your designated Care Team at your next follow up: Dr Toribio Fuel Dr Ezra Shuck Dr. Ria Commander Dr. Morene Brownie Amy Lenetta, NP Caffie Shed, [name] [facility] [place], [name] Beckey Coe, NP [place] Lee, NP Ellouise Class, NP Tinnie Redman, PharmD Jaun Bash, PharmD   Please be sure to bring in all your medications bottles to every appointment.    Thank you for choosing [facility]

## 2024-04-12 ENCOUNTER — Ambulatory Visit (HOSPITAL_COMMUNITY): Payer: Self-pay | Admitting: Family Medicine

## 2024-04-20 ENCOUNTER — Other Ambulatory Visit: Payer: Self-pay | Admitting: Family Medicine

## 2024-04-20 NOTE — Progress Notes (Signed)
Remote ICD Transmission.

## 2024-04-26 ENCOUNTER — Other Ambulatory Visit (HOSPITAL_COMMUNITY): Payer: Self-pay | Admitting: Family Medicine

## 2024-05-04 ENCOUNTER — Other Ambulatory Visit: Payer: Self-pay | Admitting: Family Medicine

## 2024-05-04 ENCOUNTER — Other Ambulatory Visit (HOSPITAL_COMMUNITY): Payer: Self-pay | Admitting: Cardiology

## 2024-05-04 DIAGNOSIS — I255 Ischemic cardiomyopathy: Secondary | ICD-10-CM

## 2024-05-04 MED ORDER — EMPAGLIFLOZIN 10 MG PO TABS: 10.0000 mg | ORAL_TABLET | Freq: Every day | ORAL | 6 refills | Status: AC

## 2024-05-04 NOTE — Telephone Encounter (Unsigned)
 Copied from CRM [id number]. Topic: Clinical - Medication Refill >> May 04, 2024 10:44 AM Amy B wrote: Medication:  tirzepatide  (MOUNJARO ) 12.5 MG/0.5ML Pen   Has the patient contacted their pharmacy? No (Agent: If no, request that the patient contact the pharmacy for the refill. If patient does not wish to contact the pharmacy document the reason why and proceed with request.) (Agent: If yes, when and what did the pharmacy advise?)  This is the patient's preferred pharmacy:  [id number], [place] - [address] AT [address] [place] [address] Phone: [phone] Fax: [phone]  Is this the correct pharmacy for this prescription? Yes If no, delete pharmacy and type the correct one.   Has the prescription been filled recently? No  Is the patient out of the medication? Yes  Has the patient been seen for an appointment in the last year OR does the patient have an upcoming appointment? Yes  Can we respond through MyChart? No  Agent: Please be advised that Rx refills may take up to 3 business days. We ask that you follow-up with your pharmacy.

## 2024-05-05 NOTE — Telephone Encounter (Signed)
 Requested Prescriptions  Refused Prescriptions Disp Refills   tirzepatide  (MOUNJARO ) 12.5 MG/0.5ML Pen 6 mL 6    Sig: Inject 12.5 mg into the skin once a week.     Off-Protocol Failed - 05/05/2024  5:32 PM      Failed - Medication not assigned to a protocol, review manually.      Passed - Valid encounter within last 12 months    Recent Outpatient Visits           5 months ago Type 2 diabetes mellitus with other circulatory complication, without long-term current use of insulin  (HCC)   [facility] [place]. [facility] Delbert Clam, MD   7 months ago Encounter for [employer] annual wellness exam   [facility] [place]. [facility] Delbert Clam, MD   11 months ago Type 2 diabetes mellitus with other circulatory complication, without long-term current use of insulin  [facility])   [facility] [place]. [facility] Delbert Clam, MD   1 year ago Type 2 diabetes mellitus with other circulatory complication, without long-term current use of insulin  [facility])   [facility] [place]. [facility] Delbert Clam, MD   1 year ago Type 2 diabetes mellitus with other circulatory complication, without long-term current use of insulin  [facility])   [facility] [place]. [facility] Kinta, [place], PA-C

## 2024-05-10 ENCOUNTER — Telehealth: Payer: Self-pay | Admitting: Family Medicine

## 2024-05-10 DIAGNOSIS — Z89512 Acquired absence of left leg below knee: Secondary | ICD-10-CM | POA: Diagnosis not present

## 2024-05-10 NOTE — Telephone Encounter (Signed)
 Copied from CRM [id number]. Topic: Clinical - Prescription Issue >> May 09, 2024  1:01 PM Pinkey ORN wrote:  Reason for CRM: tirzepatide  (MOUNJARO ) 12.5 MG/0.5ML Pen  >> May 09, 2024  1:04 PM Pinkey ORN wrote: Patient called in, states the pharmacy is needing for Newlin, Enobong, MD to call in patient's [place] prescription. States he was taking off from OZEMPIC  and replaced it with MOUNJURO. Please follow up with patient.

## 2024-05-11 ENCOUNTER — Other Ambulatory Visit: Payer: Self-pay

## 2024-05-11 MED ORDER — TIRZEPATIDE 12.5 MG/0.5ML ~~LOC~~ SOAJ: 12.5000 mg | SUBCUTANEOUS | 6 refills | Status: DC

## 2024-05-11 NOTE — Telephone Encounter (Signed)
 Medication has been sent to pharmacy.

## 2024-05-12 ENCOUNTER — Other Ambulatory Visit (HOSPITAL_COMMUNITY): Payer: Self-pay

## 2024-06-01 ENCOUNTER — Other Ambulatory Visit: Payer: Self-pay | Admitting: Pharmacist

## 2024-06-01 MED ORDER — ROSUVASTATIN CALCIUM 40 MG PO TABS: 40.0000 mg | ORAL_TABLET | Freq: Every day | ORAL | 1 refills | Status: AC

## 2024-06-01 NOTE — Progress Notes (Signed)
 Pharmacy Quality Measure Review  This patient is appearing on a report for being at risk of failing the adherence measure for cholesterol (statin) medications this calendar year.   Medication: rosuvastatin   Last fill date: 04/25/24 for 30 day supply  Contacted pharmacy to facilitate refills. Resent rxns for 90-day supplies. This will be ready for him 06/05/24. Patient called and informed.  Herlene Fleeta Morris, PharmD, JAQUELINE, CPP Clinical Pharmacist [facility] [phone]

## 2024-06-02 MED ORDER — TIRZEPATIDE 12.5 MG/0.5ML ~~LOC~~ SOAJ: 12.5000 mg | SUBCUTANEOUS | 6 refills | Status: AC

## 2024-06-02 NOTE — Addendum Note (Signed)
 Addended by: FLEETA MORRIS, GARNETTE L on: 06/02/2024 05:21 PM   Modules accepted: Orders

## 2024-06-06 ENCOUNTER — Ambulatory Visit: Payer: Self-pay

## 2024-06-06 DIAGNOSIS — I255 Ischemic cardiomyopathy: Secondary | ICD-10-CM | POA: Diagnosis not present

## 2024-06-07 ENCOUNTER — Ambulatory Visit: Payer: Self-pay | Admitting: Cardiology

## 2024-06-07 LAB — CUP PACEART REMOTE DEVICE CHECK
Battery Remaining Longevity: 104 mo
Battery Remaining Percentage: 89 %
Battery Voltage: 3.02 V
Brady Statistic RV Percent Paced: 0 %
Date Time Interrogation Session: 20251111020036
HighPow Impedance: 74 Ohm
Implantable Lead Connection Status: 753985
Implantable Lead Implant Date: 20240731
Implantable Lead Location: 753860
Implantable Pulse Generator Implant Date: 20240731
Lead Channel Impedance Value: 730 Ohm
Lead Channel Pacing Threshold Amplitude: 0.5 V
Lead Channel Pacing Threshold Pulse Width: 0.5 ms
Lead Channel Sensing Intrinsic Amplitude: 12 mV
Lead Channel Setting Pacing Amplitude: 3.5 V
Lead Channel Setting Pacing Pulse Width: 0.5 ms
Lead Channel Setting Sensing Sensitivity: 0.5 mV
Pulse Gen Serial Number: 111074047
Zone Setting Status: 755011

## 2024-06-09 NOTE — Progress Notes (Signed)
 Remote ICD Transmission

## 2024-06-10 ENCOUNTER — Other Ambulatory Visit: Payer: Self-pay | Admitting: Family Medicine

## 2024-06-19 ENCOUNTER — Telehealth (HOSPITAL_COMMUNITY): Payer: Self-pay | Admitting: Cardiology

## 2024-06-19 NOTE — Telephone Encounter (Signed)
 Done add on 11/26 @ 930 Pt aware

## 2024-06-19 NOTE — Telephone Encounter (Signed)
 Patient called to report elevated resting  HR for the past 2 weeks (105-110).  Reports no palps, CP, or SOB Reports last b/p 117/78 Denies recent medication changes  Questioned if he needs other half of bisoprolol , currently taking 12.5 daily   Advised at last OV was advised to continue to hold/ not take  Reports he stopped the other medication instead-unsure of name  Denies any additional medication changes   Please advise

## 2024-06-20 ENCOUNTER — Telehealth (HOSPITAL_COMMUNITY): Payer: Self-pay | Admitting: *Deleted

## 2024-06-20 NOTE — Telephone Encounter (Signed)
 Called to confirm/remind patient of their appointment at the [facility] on 06/21/24:       Appointment:              [x] Confirmed             [] Left mess              [] No answer/No voice mail             [] Phone not in service   Patient reminded to bring all medications and/or complete list.   Confirmed patient has transportation. Gave directions, instructed to utilize valet parking.

## 2024-06-20 NOTE — Progress Notes (Incomplete)
 Advanced Heart Failure Clinic Note    PCP: Delbert Clam, MD Primary Cardiologist: Redell Leiter, MD  HF Cardiologist: Dr. Rolan  HPI: Adam James is a 53 y.o.with a history CAD, Had PCI 2007 in [place] . 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  He had follow up with Dr Leiter 09/09/20 and was set up for cath. Cath with multivessel CAD. He was referred to SW due to financial concerns and no payor source. CMRI showed EF 40% and viable myocardium.      Admitted 02/25/2021 with left foot infection complicated by osteomyelitis and necrotizing fasciitis. 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 Hospital course complicated by erosive esophagitis on EGD. Recommendations for protonix  40 mg bid + repeat EGD in 8 weeks. Received multiple transfusions.   Admitted 03/23/21 with A/C CHF exacerbation. Cardiology consulted. CO-OX 55%.  Diuresed with IV lasix  + metolazone .  Repeat echo on 03/23/21 with LVEF of 25%. RHC performed 03/26/21 showed preserved cardiac output, mildly elevated PCWP, and mild pulmonary venous hypertension. LHC 03/28/21 showed  minimal change from prior study in 2/22.   After full treatment of foot infection, he was admitted in 10/22 for CABG with LIMA-LAD, SVG-OM1, SVG-OM3. PDA too small to graft.   Underwent L BKA 1/23 due to osteomyelitis and abscess.  Admitted 4/23 with acute on chronic CHF. Diuresed with IV lasix . Echo in 4/23 showed EF 20-25%, RV moderately reduced, mild to moderate Adam. GDMT restarted.   Echo 10/23 showed EF 30%, septal hypokinesis with septal-lateral dyssynchrony, mildly decreased RV systolic function, IVC normal.   Seen in ED 05/24 for SVT. Converted with IV amiodarone . Amiodarone  increased to 200 mg daily.   S/p Abbott ICD 7/24.  Admitted 8/24 after a right femoral neck fracture. He underwent right  direct anterior hip arthroplasty. Digoxin  was discontinued, and Coreg  increased.   Echo 7/25 showed EF about 30% (on Dr. Orvilla read) with GIDD, mild RV dysfunction.   Today he returns for AHF follow up. Overall feeling pretty good just concerned about HR being >100 at home. Also complaining about constant fatigue. Denies palpitations, CP, dizziness, edema, or PND/Orthopnea. No SOB. Appetite ok. No fever or chills. Does not weight daily. Taking all medications. Denies tobacco or drug use. Has not taken any PRN lasix  in a long time. SBP 110s at home. Drinks a mixed drink every now and then.   St Jude device interrogation (personally reviewed): Thoracic impedence stable, no VT, no V-pacing.                           PMH: 1. GI bleeding: Erosive esophagitis 8/22.  2. CKD stage 3 3. Type 2 diabetes 4. Hyperlipidemia 5. Left foot/leg diabetic infection with necrotizing fasciitis: 8/22, debridements were done and the left 5th metatarsal was amputated.  1/23 had left BKA.  6. Prior smoker 7. H/o PVC, VT: On amiodarone .  8. CAD: LHC in 9/22 with severe 3 vessel disease.  - CABG (10/22) with LIMA-LAD, SVG-OM2, SVG-OM3.  9. Chronic systolic CHF: Ischemic cardiomyopathy.  - Echo (8/22): LVEF of 25%, LV demonstrating global hypokinesis, and LV diastolic parameters consistent with grade II diastolic dysfunction.  - Echo (4/23): EF 20-25%, severe LV dysfunction, RV moderately reduced, RVSP 46.8 mmHg, mild to moderate Adam - Echo (10/23): EF 30%, septal hypokinesis with septal-lateral dyssynchrony, mildly decreased RV systolic function, IVC normal.  - Echo (  5/24): EF 25-30%, anteroseptal, inferoseptal and inferior HK, RV okay - s/p Abbott ICD 7/24 - Echo (7/25): EF about 30% on Dr. Orvilla read with grade 1 diastolic dysfunction, mild RV dysfunction.   Review of Systems: All systems reviewed and negative except as per HPI.   Current Outpatient Medications  Medication Sig Dispense Refill    acetaminophen  (TYLENOL ) 325 MG tablet Take 2 tablets (650 mg total) by mouth every 6 (six) hours as needed for mild pain (or Fever >/= 101). 30 tablet 0   albuterol  (VENTOLIN  HFA) 108 (90 Base) MCG/ACT inhaler INHALE 2 PUFFS INTO THE LUNGS EVERY 6 HOURS AS NEEDED FOR WHEEZING OR SHORTNESS OF BREATH 6.7 g 0   amiodarone  (PACERONE ) 200 MG tablet Take 0.5 tablets (100 mg total) by mouth daily.     ASPIRIN  81 PO Take 1 tablet by mouth daily.     Blood Glucose Monitoring Suppl (ONETOUCH VERIO) w/Device KIT Use to test blood sugar daily 1 kit 0   Blood Pressure Monitoring (BLOOD PRESSURE CUFF) MISC 1 each by Does not apply route as directed. Diagnosis hypertension 1 each 0   empagliflozin  (JARDIANCE ) 10 MG TABS tablet Take 1 tablet (10 mg total) by mouth daily. Please make appointment with Dr. Newlin for more refills. 30 tablet 6   eplerenone  (INSPRA ) 25 MG tablet Take 1 tablet (25 mg total) by mouth daily. 90 tablet 3   furosemide  (LASIX ) 20 MG tablet Take 20 mg by mouth as needed.     gabapentin  (NEURONTIN ) 300 MG capsule TAKE 1 CAPSULE(300 MG) BY MOUTH AT BEDTIME 90 capsule 1   glipiZIDE  (GLUCOTROL  XL) 2.5 MG 24 hr tablet Take 1 tablet (2.5 mg total) by mouth daily with breakfast. 90 tablet 1   glucose blood (ONETOUCH VERIO) test strip Use as instructed 100 each 12   glucose blood (TRUE METRIX BLOOD GLUCOSE TEST) test strip Use as instructed twice daily 100 each 12   Lancets (ONETOUCH DELICA PLUS LANCET33G) MISC Use as directed daily 100 each 3   rosuvastatin  (CRESTOR ) 40 MG tablet Take 1 tablet (40 mg total) by mouth daily. 90 tablet 1   tirzepatide  (MOUNJARO ) 12.5 MG/0.5ML Pen Inject 12.5 mg into the skin once a week. 6 mL 6   TRUEplus Lancets 28G MISC Use as directed twice daily at 8 am and 10 pm. 100 each 11   ezetimibe  (ZETIA ) 10 MG tablet Take 1 tablet (10 mg total) by mouth daily. (Patient not taking: Reported on 06/21/2024) 90 tablet 3   No current facility-administered medications for this  encounter.   Allergies  Allergen Reactions   Morphine Itching    Itching under skin   Social History   Socioeconomic History   Marital status: Single    Spouse name: Not on file   Number of children: 1   Years of education: Not on file   Highest education level: 8th grade  Occupational History   Occupation: none    Comment: former Air Traffic Controller  Tobacco Use   Smoking status: Former    Current packs/day: 0.00    Average packs/day: 3.0 packs/day for 36.0 years (108.0 ttl pk-yrs)    Types: Cigarettes    Start date: 08/27/1984    Quit date: 08/27/2020    Years since quitting: 3.8   Smokeless tobacco: Never  Vaping Use   Vaping status: Never Used  Substance and Sexual Activity   Alcohol use: Not Currently    Comment: Very rare   Drug use: Not Currently  Types: Marijuana    Comment: smoked marijuana in his youth(teenager)   Sexual activity: Not on file  Other Topics Concern   Not on file  Social History Narrative   Not on file   Social Drivers of Health   Financial Resource Strain: Low Risk  (09/22/2023)   Overall Financial Resource Strain (CARDIA)    Difficulty of Paying Living Expenses: Not hard at all  Food Insecurity: No Food Insecurity (09/22/2023)   Hunger Vital Sign    Worried About Running Out of Food in the Last Year: Never true    Ran Out of Food in the Last Year: Never true  Transportation Needs: No Transportation Needs (09/22/2023)   PRAPARE - Administrator, Civil Service (Medical): No    Lack of Transportation (Non-Medical): No  Physical Activity: Inactive (09/22/2023)   Exercise Vital Sign    Days of Exercise per Week: 0 days    Minutes of Exercise per Session: 0 min  Stress: No Stress Concern Present (09/22/2023)   [employer] of Occupational Health - Occupational Stress Questionnaire    Feeling of Stress : Not at all  Social Connections: Socially Isolated (09/22/2023)   Social Connection and Isolation Panel    Frequency of Communication  with Friends and Family: Three times a week    Frequency of Social Gatherings with Friends and Family: Once a week    Attends Religious Services: Never    Database Administrator or Organizations: No    Attends Banker Meetings: Never    Marital Status: Never married  Intimate Partner Violence: Not At Risk (09/22/2023)   Humiliation, Afraid, Rape, and Kick questionnaire    Fear of Current or Ex-Partner: No    Emotionally Abused: No    Physically Abused: No    Sexually Abused: No   Family History  Problem Relation Age of Onset   Hypertension Mother    Heart failure Father    Hypertension Brother    Congestive Heart Failure Brother    BP 118/84   Pulse (!) 107   Wt 97.7 kg (215 lb 6.4 oz)   SpO2 98%   BMI 26.92 kg/m   Wt Readings from Last 3 Encounters:  06/21/24 97.7 kg (215 lb 6.4 oz)  04/11/24 106.6 kg (235 lb)  02/09/24 109 kg (240 lb 6.4 oz)   PHYSICAL EXAM: General:  well appearing.  No respiratory difficulty. Walked into clinic.  Neck: JVD flat.  Cor: Tachy/regular rate & rhythm. No murmurs. Lungs: clear Extremities: no edema. LLE prosthesis Neuro: alert & oriented x 3. Affect pleasant.   EKG ST 107 bpm (Personally reviewed)    ASSESSMENT & PLAN: 1. Chronic systolic CHF: Ischemic cardiomyopathy. Abbott ICD. Initial echo in 2/22 with EF 40-45%.  Echo in 7/22 with EF 30-35%.  Echo 8/22 EF down to 25% with mild RV dysfunction and dilated IVC.  RHC 8/22 with preserved CO, mildly elevated PCWP, mild pulmonary hypertension. S/p CABG 10/22. Readmitted 4/23 with CHF, had been off diuretics and missed follow up. Echo in 4/23 with EF 20-25%, RV moderately reduced, mild to moderate Adam. Echo 10/23 EF 30%, septal hypokinesis with septal-lateral dyssynchrony, mildly decreased RV systolic function, IVC normal.  Echo 5/24 showed EF 25-30%, RV okay. Echo 7/25: EF about 30% (on Dr. Orvilla read) with GIDD, mild RV dysfunction.  - NYHA class I-II, not volume overloaded on  exam or Corvue. CKD stage 3 limit GDMT titration.  - Continue Lasix  20 mg PRN.  Has not needed recently.  - Continue Jardiance  10 mg daily.  - Continue eplerenone  25 mg daily, check BMET/BNP today.  - Increase Bisoprolol  2.5>5 mg daily  - No BP room to add ARB/ARNI.  2. CAD: Hx PCI 2007. S/p CABG x 3 with LIMA-LAD, SVG-OM2, and SVG-OM3 10/22. No chest pain.  - Continue  ASA 81 daily.  - Continue Crestor  40 mg daily. Now off Zetia .  - Goal LDL < 55, LDL 52 9/25. LFTs stable 9/25 3. PVCs/VT: Noted 10/22 post CABG. Denies palpitations. - Continue amiodarone  to 100 mg daily.  He will need regular eye exam.  - None on EKG today 4. SVT: Episode 5/24. Converted with IV amiodarone . - Continue amiodarone  100 mg daily, as above. 5. CKD stage 3: Scr baseline around 1.9.  - Continue SGLT2i. BMET today. 6. GI bleeding: History of erosive esophagitis in 8/22.   7. Left leg diabetic foot infection: Underwent left BKA 1/23. And revision of L BKA 10/23. Left stump now healed.  - Has prosthesis and walking.  8. Overweight: Body mass index is 26.92 kg/m. - On tirzepatide  and weight trending down.  9. Fatigue - Check anemia panel - Asked him to switch bisoprolol  and gabapentin  to night time.  10. Tachycardia - Increase bisoprolol  as above.   Pillbox provided today.   Follow up in 3 months with Dr. Rolan Beckey LITTIE Hayes AGACNP-BC  06/21/24

## 2024-06-21 ENCOUNTER — Encounter (HOSPITAL_COMMUNITY): Payer: Self-pay

## 2024-06-21 ENCOUNTER — Ambulatory Visit (HOSPITAL_COMMUNITY)
Admission: RE | Admit: 2024-06-21 | Discharge: 2024-06-21 | Disposition: A | Discharge status: Home / Self Care | Source: Ambulatory Visit | Attending: Internal Medicine | Admitting: Internal Medicine

## 2024-06-21 ENCOUNTER — Telehealth (HOSPITAL_COMMUNITY): Payer: Self-pay | Admitting: *Deleted

## 2024-06-21 ENCOUNTER — Ambulatory Visit (HOSPITAL_COMMUNITY): Payer: Self-pay | Admitting: Internal Medicine

## 2024-06-21 VITALS — BP 118/84 | HR 107 | Wt 215.4 lb

## 2024-06-21 DIAGNOSIS — Z79899 Other long term (current) drug therapy: Secondary | ICD-10-CM | POA: Insufficient documentation

## 2024-06-21 DIAGNOSIS — Z9861 Coronary angioplasty status: Secondary | ICD-10-CM | POA: Diagnosis not present

## 2024-06-21 DIAGNOSIS — I5022 Chronic systolic (congestive) heart failure: Secondary | ICD-10-CM

## 2024-06-21 DIAGNOSIS — Z89512 Acquired absence of left leg below knee: Secondary | ICD-10-CM | POA: Diagnosis not present

## 2024-06-21 DIAGNOSIS — Z96641 Presence of right artificial hip joint: Secondary | ICD-10-CM | POA: Insufficient documentation

## 2024-06-21 DIAGNOSIS — I255 Ischemic cardiomyopathy: Secondary | ICD-10-CM | POA: Diagnosis not present

## 2024-06-21 DIAGNOSIS — Z87891 Personal history of nicotine dependence: Secondary | ICD-10-CM | POA: Diagnosis not present

## 2024-06-21 DIAGNOSIS — Z7982 Long term (current) use of aspirin: Secondary | ICD-10-CM | POA: Diagnosis not present

## 2024-06-21 DIAGNOSIS — Z794 Long term (current) use of insulin: Secondary | ICD-10-CM | POA: Insufficient documentation

## 2024-06-21 DIAGNOSIS — I130 Hypertensive heart and chronic kidney disease with heart failure and stage 1 through stage 4 chronic kidney disease, or unspecified chronic kidney disease: Secondary | ICD-10-CM | POA: Insufficient documentation

## 2024-06-21 DIAGNOSIS — R5383 Other fatigue: Secondary | ICD-10-CM | POA: Diagnosis not present

## 2024-06-21 DIAGNOSIS — I2510 Atherosclerotic heart disease of native coronary artery without angina pectoris: Secondary | ICD-10-CM | POA: Insufficient documentation

## 2024-06-21 DIAGNOSIS — E785 Hyperlipidemia, unspecified: Secondary | ICD-10-CM | POA: Diagnosis not present

## 2024-06-21 DIAGNOSIS — N1831 Chronic kidney disease, stage 3a: Secondary | ICD-10-CM

## 2024-06-21 DIAGNOSIS — E663 Overweight: Secondary | ICD-10-CM | POA: Insufficient documentation

## 2024-06-21 DIAGNOSIS — I471 Supraventricular tachycardia, unspecified: Secondary | ICD-10-CM | POA: Insufficient documentation

## 2024-06-21 DIAGNOSIS — Z951 Presence of aortocoronary bypass graft: Secondary | ICD-10-CM | POA: Insufficient documentation

## 2024-06-21 DIAGNOSIS — Z7984 Long term (current) use of oral hypoglycemic drugs: Secondary | ICD-10-CM | POA: Insufficient documentation

## 2024-06-21 DIAGNOSIS — I272 Pulmonary hypertension, unspecified: Secondary | ICD-10-CM | POA: Insufficient documentation

## 2024-06-21 DIAGNOSIS — N183 Chronic kidney disease, stage 3 unspecified: Secondary | ICD-10-CM | POA: Diagnosis not present

## 2024-06-21 DIAGNOSIS — E1169 Type 2 diabetes mellitus with other specified complication: Secondary | ICD-10-CM | POA: Diagnosis not present

## 2024-06-21 DIAGNOSIS — I493 Ventricular premature depolarization: Secondary | ICD-10-CM | POA: Diagnosis not present

## 2024-06-21 DIAGNOSIS — R000 Tachycardia, unspecified: Secondary | ICD-10-CM

## 2024-06-21 DIAGNOSIS — E1122 Type 2 diabetes mellitus with diabetic chronic kidney disease: Secondary | ICD-10-CM | POA: Insufficient documentation

## 2024-06-21 DIAGNOSIS — I5042 Chronic combined systolic (congestive) and diastolic (congestive) heart failure: Secondary | ICD-10-CM

## 2024-06-21 DIAGNOSIS — Z89619 Acquired absence of unspecified leg above knee: Secondary | ICD-10-CM | POA: Diagnosis not present

## 2024-06-21 DIAGNOSIS — Z6826 Body mass index (BMI) 26.0-26.9, adult: Secondary | ICD-10-CM | POA: Insufficient documentation

## 2024-06-21 DIAGNOSIS — Z8719 Personal history of other diseases of the digestive system: Secondary | ICD-10-CM | POA: Diagnosis not present

## 2024-06-21 LAB — BASIC METABOLIC PANEL WITH GFR
Anion gap: 14 (ref 5–15)
BUN: 33 mg/dL — ABNORMAL HIGH (ref 6–20)
CO2: 19 mmol/L — ABNORMAL LOW (ref 22–32)
Calcium: 9.7 mg/dL (ref 8.9–10.3)
Chloride: 108 mmol/L (ref 98–111)
Creatinine, Ser: 1.89 mg/dL — ABNORMAL HIGH (ref 0.61–1.24)
GFR, Estimated: 42 mL/min — ABNORMAL LOW (ref >60)
Glucose, Bld: 107 mg/dL — ABNORMAL HIGH (ref 70–99)
Potassium: 5.3 mmol/L — ABNORMAL HIGH (ref 3.5–5.1)
Sodium: 141 mmol/L (ref 135–145)

## 2024-06-21 LAB — BRAIN NATRIURETIC PEPTIDE: B Natriuretic Peptide: 34.4 pg/mL (ref 0.0–100.0)

## 2024-06-21 LAB — IRON AND TIBC
Iron: 81 ug/dL (ref 45–182)
Saturation Ratios: 27 % (ref 17.9–39.5)
TIBC: 304 ug/dL (ref 250–450)
UIBC: 223 ug/dL

## 2024-06-21 LAB — FERRITIN: Ferritin: 361 ng/mL — ABNORMAL HIGH (ref 24–336)

## 2024-06-21 MED ORDER — EPLERENONE 25 MG PO TABS: 12.5000 mg | ORAL_TABLET | Freq: Every day | ORAL | 3 refills | Status: AC

## 2024-06-21 MED ORDER — BISOPROLOL FUMARATE 5 MG PO TABS: 5.0000 mg | ORAL_TABLET | Freq: Every day | ORAL | 3 refills | Status: AC

## 2024-06-21 NOTE — Telephone Encounter (Signed)
 Called patient per Beckey Coe, NP with following:  K elevated. Hold Inspra  x3 days and restart at 12.5 mg daily. Would not go back to whole dose as this has happened in the past. Repeat BMET x1 week. Otherwise labs stable.   Pt verbalized understanding of same. Lab ordered and scheduled.

## 2024-06-21 NOTE — Patient Instructions (Signed)
 Increase bisoprolol  to 5 mg daily at bedtime. Labs today - will call you if abnormal. Return to see Dr. Rolan in 3 months.  CALL [phone] IN FEBRUARY TO SCHEDULE THIS APPOINTMENT.  4.   Please call us  at [phone] if any questions or concerns prior to your next visit.

## 2024-07-03 ENCOUNTER — Ambulatory Visit (HOSPITAL_COMMUNITY)

## 2024-07-17 ENCOUNTER — Encounter: Payer: Self-pay | Admitting: Cardiology

## 2024-08-03 ENCOUNTER — Other Ambulatory Visit: Payer: Self-pay

## 2024-09-01 ENCOUNTER — Other Ambulatory Visit: Payer: Self-pay | Admitting: Family Medicine

## 2024-09-01 DIAGNOSIS — E1159 Type 2 diabetes mellitus with other circulatory complications: Secondary | ICD-10-CM

## 2024-09-01 MED ORDER — GLIPIZIDE ER 2.5 MG PO TB24: 2.5000 mg | ORAL_TABLET | Freq: Every day | ORAL | 0 refills | Status: AC

## 2024-09-06 ENCOUNTER — Ambulatory Visit: Payer: Self-pay | Admitting: Family Medicine
